# Patient Record
Sex: Male | Born: 1960 | Race: White | Hispanic: No | Smoking: Former smoker
Health system: Southern US, Community
[De-identification: ages and names within clinical notes are randomized; demographics above are authoritative.]

## PROBLEM LIST (undated history)

## (undated) DIAGNOSIS — I739 Peripheral vascular disease, unspecified: Principal | ICD-10-CM

## (undated) DIAGNOSIS — F32A Depression, unspecified: Secondary | ICD-10-CM

## (undated) DIAGNOSIS — I509 Heart failure, unspecified: Secondary | ICD-10-CM

## (undated) DIAGNOSIS — R0602 Shortness of breath: Secondary | ICD-10-CM

## (undated) DIAGNOSIS — F319 Bipolar disorder, unspecified: Secondary | ICD-10-CM

## (undated) DIAGNOSIS — J189 Pneumonia, unspecified organism: Secondary | ICD-10-CM

## (undated) DIAGNOSIS — F329 Major depressive disorder, single episode, unspecified: Secondary | ICD-10-CM

## (undated) DIAGNOSIS — K279 Peptic ulcer, site unspecified, unspecified as acute or chronic, without hemorrhage or perforation: Secondary | ICD-10-CM

## (undated) DIAGNOSIS — D62 Acute posthemorrhagic anemia: Secondary | ICD-10-CM

## (undated) DIAGNOSIS — J449 Chronic obstructive pulmonary disease, unspecified: Principal | ICD-10-CM

## (undated) DIAGNOSIS — I10 Essential (primary) hypertension: Secondary | ICD-10-CM

## (undated) DIAGNOSIS — E785 Hyperlipidemia, unspecified: Secondary | ICD-10-CM

## (undated) DIAGNOSIS — M1990 Unspecified osteoarthritis, unspecified site: Secondary | ICD-10-CM

## (undated) DIAGNOSIS — F419 Anxiety disorder, unspecified: Secondary | ICD-10-CM

## (undated) HISTORY — DX: Peptic ulcer, site unspecified, unspecified as acute or chronic, without hemorrhage or perforation: K27.9

## (undated) HISTORY — PX: SHOULDER SURGERY: SHX246

## (undated) HISTORY — PX: TOE SURGERY: SHX1073

## (undated) HISTORY — DX: Peripheral vascular disease, unspecified: I73.9

## (undated) HISTORY — DX: Chronic obstructive pulmonary disease, unspecified: J44.9

## (undated) HISTORY — DX: Bipolar disorder, unspecified: F31.9

## (undated) HISTORY — PX: HAND SURGERY: SHX662

## (undated) HISTORY — DX: Hyperlipidemia, unspecified: E78.5

---

## 2001-02-19 ENCOUNTER — Emergency Department (HOSPITAL_COMMUNITY): Admission: EM | Admit: 2001-02-19 | Discharge: 2001-02-19 | Payer: Self-pay | Admitting: Emergency Medicine

## 2001-04-08 ENCOUNTER — Ambulatory Visit (HOSPITAL_COMMUNITY): Admission: RE | Admit: 2001-04-08 | Discharge: 2001-04-08 | Payer: Self-pay | Admitting: Family Medicine

## 2001-04-08 ENCOUNTER — Encounter: Payer: Self-pay | Admitting: Family Medicine

## 2001-11-27 ENCOUNTER — Encounter: Payer: Self-pay | Admitting: Orthopedic Surgery

## 2001-11-27 ENCOUNTER — Inpatient Hospital Stay (HOSPITAL_COMMUNITY): Admission: EM | Admit: 2001-11-27 | Discharge: 2001-11-29 | Payer: Self-pay | Admitting: Emergency Medicine

## 2002-04-18 ENCOUNTER — Ambulatory Visit (HOSPITAL_COMMUNITY): Admission: RE | Admit: 2002-04-18 | Discharge: 2002-04-18 | Payer: Self-pay | Admitting: Family Medicine

## 2002-04-18 ENCOUNTER — Encounter: Payer: Self-pay | Admitting: Family Medicine

## 2003-01-09 ENCOUNTER — Emergency Department (HOSPITAL_COMMUNITY): Admission: EM | Admit: 2003-01-09 | Discharge: 2003-01-09 | Payer: Self-pay | Admitting: *Deleted

## 2003-02-19 ENCOUNTER — Ambulatory Visit (HOSPITAL_COMMUNITY): Admission: RE | Admit: 2003-02-19 | Discharge: 2003-02-19 | Payer: Self-pay | Admitting: Family Medicine

## 2003-02-19 ENCOUNTER — Encounter: Payer: Self-pay | Admitting: Family Medicine

## 2003-10-01 ENCOUNTER — Emergency Department (HOSPITAL_COMMUNITY): Admission: EM | Admit: 2003-10-01 | Discharge: 2003-10-01 | Payer: Self-pay | Admitting: Emergency Medicine

## 2004-02-13 ENCOUNTER — Emergency Department (HOSPITAL_COMMUNITY): Admission: EM | Admit: 2004-02-13 | Discharge: 2004-02-13 | Payer: Self-pay | Admitting: Emergency Medicine

## 2004-02-14 ENCOUNTER — Inpatient Hospital Stay (HOSPITAL_COMMUNITY): Admission: RE | Admit: 2004-02-14 | Discharge: 2004-02-26 | Payer: Self-pay | Admitting: Psychiatry

## 2004-02-22 ENCOUNTER — Ambulatory Visit (HOSPITAL_COMMUNITY): Admission: RE | Admit: 2004-02-22 | Discharge: 2004-02-22 | Payer: Self-pay | Admitting: Psychiatry

## 2004-02-23 ENCOUNTER — Emergency Department (HOSPITAL_COMMUNITY): Admission: EM | Admit: 2004-02-23 | Discharge: 2004-02-23 | Payer: Self-pay | Admitting: Emergency Medicine

## 2004-05-05 ENCOUNTER — Ambulatory Visit (HOSPITAL_COMMUNITY): Admission: RE | Admit: 2004-05-05 | Discharge: 2004-05-05 | Payer: Self-pay | Admitting: Family Medicine

## 2004-05-12 ENCOUNTER — Encounter
Admission: RE | Admit: 2004-05-12 | Discharge: 2004-07-30 | Payer: Self-pay | Admitting: Physical Medicine & Rehabilitation

## 2004-06-03 ENCOUNTER — Ambulatory Visit (HOSPITAL_COMMUNITY): Admission: RE | Admit: 2004-06-03 | Discharge: 2004-06-03 | Payer: Self-pay | Admitting: Orthopedic Surgery

## 2004-06-09 ENCOUNTER — Encounter (HOSPITAL_COMMUNITY): Admission: RE | Admit: 2004-06-09 | Discharge: 2004-07-09 | Payer: Self-pay | Admitting: Orthopedic Surgery

## 2004-07-15 ENCOUNTER — Encounter (HOSPITAL_COMMUNITY): Admission: RE | Admit: 2004-07-15 | Discharge: 2004-08-14 | Payer: Self-pay | Admitting: Orthopedic Surgery

## 2004-10-23 ENCOUNTER — Inpatient Hospital Stay (HOSPITAL_COMMUNITY): Admission: EM | Admit: 2004-10-23 | Discharge: 2004-10-31 | Payer: Self-pay | Admitting: Emergency Medicine

## 2004-11-01 ENCOUNTER — Encounter (HOSPITAL_COMMUNITY): Admission: RE | Admit: 2004-11-01 | Discharge: 2004-11-21 | Payer: Self-pay | Admitting: General Surgery

## 2005-01-28 ENCOUNTER — Emergency Department (HOSPITAL_COMMUNITY): Admission: EM | Admit: 2005-01-28 | Discharge: 2005-01-29 | Payer: Self-pay | Admitting: *Deleted

## 2005-01-29 ENCOUNTER — Ambulatory Visit: Payer: Self-pay | Admitting: Psychiatry

## 2005-01-29 ENCOUNTER — Inpatient Hospital Stay (HOSPITAL_COMMUNITY): Admission: EM | Admit: 2005-01-29 | Discharge: 2005-02-05 | Payer: Self-pay | Admitting: Psychiatry

## 2005-02-24 ENCOUNTER — Ambulatory Visit: Payer: Self-pay | Admitting: *Deleted

## 2005-03-03 ENCOUNTER — Encounter (HOSPITAL_COMMUNITY): Admission: RE | Admit: 2005-03-03 | Discharge: 2005-03-03 | Payer: Self-pay | Admitting: *Deleted

## 2005-03-03 ENCOUNTER — Ambulatory Visit: Payer: Self-pay | Admitting: *Deleted

## 2005-03-10 ENCOUNTER — Ambulatory Visit: Payer: Self-pay | Admitting: *Deleted

## 2007-02-25 ENCOUNTER — Ambulatory Visit (HOSPITAL_COMMUNITY): Admission: RE | Admit: 2007-02-25 | Discharge: 2007-02-25 | Payer: Self-pay | Admitting: Family Medicine

## 2007-03-07 ENCOUNTER — Ambulatory Visit: Payer: Self-pay | Admitting: Internal Medicine

## 2007-03-14 ENCOUNTER — Encounter (INDEPENDENT_AMBULATORY_CARE_PROVIDER_SITE_OTHER): Payer: Self-pay | Admitting: Specialist

## 2007-03-14 ENCOUNTER — Ambulatory Visit: Payer: Self-pay | Admitting: Internal Medicine

## 2007-03-14 ENCOUNTER — Ambulatory Visit (HOSPITAL_COMMUNITY): Admission: RE | Admit: 2007-03-14 | Discharge: 2007-03-14 | Payer: Self-pay | Admitting: Internal Medicine

## 2007-03-14 HISTORY — PX: ESOPHAGOGASTRODUODENOSCOPY: SHX1529

## 2007-03-14 HISTORY — PX: COLONOSCOPY: SHX174

## 2007-05-26 ENCOUNTER — Ambulatory Visit: Payer: Self-pay | Admitting: Cardiology

## 2007-07-20 ENCOUNTER — Other Ambulatory Visit: Payer: Self-pay

## 2007-07-20 ENCOUNTER — Ambulatory Visit: Payer: Self-pay | Admitting: Psychiatry

## 2007-07-20 ENCOUNTER — Inpatient Hospital Stay (HOSPITAL_COMMUNITY): Admission: AD | Admit: 2007-07-20 | Discharge: 2007-07-26 | Payer: Self-pay | Admitting: Psychiatry

## 2008-11-05 ENCOUNTER — Emergency Department (HOSPITAL_COMMUNITY): Admission: EM | Admit: 2008-11-05 | Discharge: 2008-11-05 | Payer: Self-pay | Admitting: Emergency Medicine

## 2009-01-21 ENCOUNTER — Emergency Department (HOSPITAL_COMMUNITY): Admission: EM | Admit: 2009-01-21 | Discharge: 2009-01-21 | Payer: Self-pay | Admitting: Emergency Medicine

## 2009-10-10 ENCOUNTER — Ambulatory Visit (HOSPITAL_COMMUNITY): Admission: RE | Admit: 2009-10-10 | Discharge: 2009-10-10 | Payer: Self-pay | Admitting: Family Medicine

## 2010-08-07 ENCOUNTER — Ambulatory Visit (HOSPITAL_COMMUNITY): Admission: RE | Admit: 2010-08-07 | Discharge: 2010-08-07 | Payer: Self-pay | Admitting: Family Medicine

## 2010-11-13 ENCOUNTER — Ambulatory Visit (HOSPITAL_COMMUNITY)
Admission: RE | Admit: 2010-11-13 | Discharge: 2010-11-13 | Payer: Self-pay | Source: Home / Self Care | Attending: Family Medicine | Admitting: Family Medicine

## 2011-03-01 ENCOUNTER — Emergency Department (HOSPITAL_COMMUNITY)
Admission: EM | Admit: 2011-03-01 | Discharge: 2011-03-01 | Disposition: A | Discharge status: Home / Self Care | Payer: Medicare PPO | Attending: Emergency Medicine | Admitting: Emergency Medicine

## 2011-03-01 DIAGNOSIS — R112 Nausea with vomiting, unspecified: Secondary | ICD-10-CM | POA: Insufficient documentation

## 2011-03-01 DIAGNOSIS — S98139A Complete traumatic amputation of one unspecified lesser toe, initial encounter: Secondary | ICD-10-CM | POA: Insufficient documentation

## 2011-03-01 DIAGNOSIS — Z79899 Other long term (current) drug therapy: Secondary | ICD-10-CM | POA: Insufficient documentation

## 2011-03-01 DIAGNOSIS — R109 Unspecified abdominal pain: Secondary | ICD-10-CM | POA: Insufficient documentation

## 2011-03-01 DIAGNOSIS — R197 Diarrhea, unspecified: Secondary | ICD-10-CM | POA: Insufficient documentation

## 2011-03-01 DIAGNOSIS — F17200 Nicotine dependence, unspecified, uncomplicated: Secondary | ICD-10-CM | POA: Insufficient documentation

## 2011-03-01 DIAGNOSIS — W57XXXA Bitten or stung by nonvenomous insect and other nonvenomous arthropods, initial encounter: Secondary | ICD-10-CM | POA: Insufficient documentation

## 2011-03-01 DIAGNOSIS — T148 Other injury of unspecified body region: Secondary | ICD-10-CM | POA: Insufficient documentation

## 2011-03-01 LAB — COMPREHENSIVE METABOLIC PANEL
ALT: 19 U/L (ref 0–53)
AST: 21 U/L (ref 0–37)
Albumin: 3.5 g/dL (ref 3.5–5.2)
Alkaline Phosphatase: 57 U/L (ref 39–117)
BUN: 2 mg/dL — ABNORMAL LOW (ref 6–23)
CO2: 28 mEq/L (ref 19–32)
Calcium: 9.1 mg/dL (ref 8.4–10.5)
Chloride: 103 mEq/L (ref 96–112)
Creatinine, Ser: 0.92 mg/dL (ref 0.4–1.5)
GFR calc Af Amer: >60 mL/min (ref >60)
GFR calc non Af Amer: >60 mL/min (ref >60)
Glucose, Bld: 90 mg/dL (ref 70–99)
Potassium: 3.7 mEq/L (ref 3.5–5.1)
Sodium: 136 mEq/L (ref 135–145)
Total Bilirubin: 0.7 mg/dL (ref 0.3–1.2)
Total Protein: 6 g/dL (ref 6.0–8.3)

## 2011-03-01 LAB — URINALYSIS, ROUTINE W REFLEX MICROSCOPIC
Bilirubin Urine: NEGATIVE
Glucose, UA: NEGATIVE mg/dL
Hgb urine dipstick: NEGATIVE
Ketones, ur: NEGATIVE mg/dL
Nitrite: NEGATIVE
Protein, ur: NEGATIVE mg/dL
Specific Gravity, Urine: 1.01 (ref 1.005–1.030)
Urobilinogen, UA: 0.2 mg/dL (ref 0.0–1.0)
pH: 7 (ref 5.0–8.0)

## 2011-03-01 LAB — DIFFERENTIAL
Basophils Absolute: 0 10*3/uL (ref 0.0–0.1)
Basophils Relative: 1 % (ref 0–1)
Eosinophils Absolute: 0.2 10*3/uL (ref 0.0–0.7)
Eosinophils Relative: 3 % (ref 0–5)
Lymphocytes Relative: 30 % (ref 12–46)
Lymphs Abs: 1.9 10*3/uL (ref 0.7–4.0)
Monocytes Absolute: 0.4 10*3/uL (ref 0.1–1.0)
Monocytes Relative: 7 % (ref 3–12)
Neutro Abs: 3.8 10*3/uL (ref 1.7–7.7)
Neutrophils Relative %: 60 % (ref 43–77)

## 2011-03-01 LAB — CBC
HCT: 38.6 % — ABNORMAL LOW (ref 39.0–52.0)
Hemoglobin: 13.2 g/dL (ref 13.0–17.0)
MCH: 30.3 pg (ref 26.0–34.0)
MCHC: 34.2 g/dL (ref 30.0–36.0)
MCV: 88.7 fL (ref 78.0–100.0)
Platelets: 253 10*3/uL (ref 150–400)
RBC: 4.35 MIL/uL (ref 4.22–5.81)
RDW: 12.4 % (ref 11.5–15.5)
WBC: 6.3 10*3/uL (ref 4.0–10.5)

## 2011-03-01 LAB — LIPASE, BLOOD: Lipase: 22 U/L (ref 11–59)

## 2011-03-05 LAB — BASIC METABOLIC PANEL
BUN: 4 mg/dL — ABNORMAL LOW (ref 6–23)
CO2: 33 mEq/L — ABNORMAL HIGH (ref 19–32)
Calcium: 8.8 mg/dL (ref 8.4–10.5)
Chloride: 101 mEq/L (ref 96–112)
Creatinine, Ser: 0.8 mg/dL (ref 0.4–1.5)
GFR calc Af Amer: >60 mL/min (ref >60)
GFR calc non Af Amer: >60 mL/min (ref >60)
Glucose, Bld: 91 mg/dL (ref 70–99)
Potassium: 4 mEq/L (ref 3.5–5.1)
Sodium: 138 mEq/L (ref 135–145)

## 2011-03-05 LAB — CBC
HCT: 45.2 % (ref 39.0–52.0)
Hemoglobin: 15.5 g/dL (ref 13.0–17.0)
MCHC: 34.2 g/dL (ref 30.0–36.0)
MCV: 94.1 fL (ref 78.0–100.0)
Platelets: 257 10*3/uL (ref 150–400)
RBC: 4.8 MIL/uL (ref 4.22–5.81)
RDW: 12.4 % (ref 11.5–15.5)
WBC: 6.6 10*3/uL (ref 4.0–10.5)

## 2011-03-05 LAB — URINALYSIS, ROUTINE W REFLEX MICROSCOPIC
Bilirubin Urine: NEGATIVE
Glucose, UA: NEGATIVE mg/dL
Hgb urine dipstick: NEGATIVE
Ketones, ur: NEGATIVE mg/dL
Nitrite: NEGATIVE
Protein, ur: NEGATIVE mg/dL
Specific Gravity, Urine: <1.005 — ABNORMAL LOW (ref 1.005–1.030)
Urobilinogen, UA: 0.2 mg/dL (ref 0.0–1.0)
pH: 6.5 (ref 5.0–8.0)

## 2011-03-05 LAB — DIFFERENTIAL
Basophils Absolute: 0.1 10*3/uL (ref 0.0–0.1)
Basophils Relative: 2 % — ABNORMAL HIGH (ref 0–1)
Eosinophils Absolute: 0.1 10*3/uL (ref 0.0–0.7)
Eosinophils Relative: 1 % (ref 0–5)
Lymphocytes Relative: 41 % (ref 12–46)
Lymphs Abs: 2.7 10*3/uL (ref 0.7–4.0)
Monocytes Absolute: 0.5 10*3/uL (ref 0.1–1.0)
Monocytes Relative: 7 % (ref 3–12)
Neutro Abs: 3.3 10*3/uL (ref 1.7–7.7)
Neutrophils Relative %: 50 % (ref 43–77)

## 2011-03-05 LAB — SALICYLATE LEVEL: Salicylate Lvl: <4 mg/dL (ref 2.8–20.0)

## 2011-03-05 LAB — RAPID URINE DRUG SCREEN, HOSP PERFORMED
Amphetamines: NOT DETECTED
Barbiturates: NOT DETECTED
Benzodiazepines: POSITIVE — AB
Cocaine: NOT DETECTED
Opiates: POSITIVE — AB
Tetrahydrocannabinol: NOT DETECTED

## 2011-03-05 LAB — ACETAMINOPHEN LEVEL: Acetaminophen (Tylenol), Serum: <10 ug/mL — ABNORMAL LOW (ref 10–30)
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
## 2011-04-10 NOTE — Op Note (Signed)
[name], [name]               ACCOUNT NO.:  [id number]   MEDICAL RECORD NO.:  [id number]          PATIENT TYPE:  AMB   LOCATION:  DAY                           [facility]  [facility]   PHYSICIAN:  Jeffrey [name], M.D.    DATE OF BIRTH:  Nov 06, 1961   DATE OF PROCEDURE:  03/14/2007  DATE OF DISCHARGE:                               OPERATIVE REPORT   PROCEDURE:  Esophagogastroduodenoscopy followed by colonoscopy.   INDICATIONS:  Jeffrey [name] is a 50 year old Caucasian male who has a 30-pound  weight loss over 3 months, associated with nausea, vomiting and  postprandial diarrhea.  He had a negative upper abdominal ultrasound and  workup has included normal CBC, chemistry panel except low glucose  level.  He also had upper abdominal ultrasound two weeks ago which was  negative other than single lesions suspicious for hemangioma measuring  10 x 12 mm.  He is undergoing diagnostic evaluation.  Procedure risks  were reviewed with the patient and informed consent was obtained.   ANESTHESIA:  Meds for conscious sedation included benzocaine spray for  pharyngeal topical anesthesia, Demerol 50 mg IV, Versed 15 mg IV.   FINDINGS:  Procedure performed in endoscopy suite.  The patient's vital  signs and O2 sat were monitored during procedure and remained stable.   Procedure #1.  Esophagogastroduodenoscopy:  The patient was placed in  the left lateral recumbent position.  Pentax videoscope was passed via  oropharynx without any difficulty into esophagus.   Esophagus:  Mucosa of the esophagus was normal.  GE junction was at 40  cm from the incisors and was unremarkable.   Stomach:  It was empty and distended very well with insufflation.  Folds  proximal stomach were normal.  Examination of the mucosa at the body was  normal.  There was patchy erythema at antrum without erosions or  ulceration.  Pyloric channel was patent.  Angularis, fundus and cardia  were examined by retroflexing the scope and were  normal.   Duodenum: Bulbar mucosa revealed patchy erythema and edema.  Scope was  passed in the second part of duodenum where there was some paucity of  folds.  There was no ulceration or scalloping noted.  Biopsy was taken  from the second and third part of the duodenum for routine histology and  endoscope was withdrawn.  The patient prepared for procedure #2.   Procedure #2.  Colonoscopy:.  Rectal examination performed.  No  abnormality noted on external or digital exam.  Pentax videoscope was  placed in the rectum and advanced under vision into sigmoid colon and  beyond.  Preparation was satisfactory.  Scope was passed into cecum  which was identified by appendiceal orifice and ileocecal valve.  Terminal ileum was examined for 10-15 cm and revealed normal mucosa.  Pictures taken for the record.  As the scope was withdrawn, colonic  mucosa was once again carefully examined and reveals normal mucosal  pattern throughout.  No diverticular changes were noted.  Rectal mucosa  similarly was normal.  Scope was retroflexed to examine anorectal  junction and single hemorrhoid was noted below the  dentate line.  Endoscope was then withdrawn.  The patient tolerated the procedure well.   FINAL DIAGNOSIS:  1. Nonerosive antral gastritis with bulbar duodenitis.  2. Some paucity to postbulbar duodenal folds and biopsy taken for      routine histology.  3. Normal colonoscopy and terminal ileoscopy except external      hemorrhoids.   These findings would not explain the patient's symptomatology.   RECOMMENDATIONS:  1. Will check a sed rate, H pylori serology, and random cortisol      level.  2. Bentyl 10 mg before each meal.  Prescription given for 60 doses      with one refill.  3. I will be contacting the patient with results of biopsy and blood      test results.  If all of these are negative, we will bring him back      for abdominopelvic CT.  4. Will also confer with his psychiatrist to  make sure his bipolar      disorder is in remission.      Jeffrey [name], M.D.  Electronically Signed     NR/MEDQ  D:  03/14/2007  T:  03/15/2007  Job:  [id number]   cc:   Angus G. Renard Matter, MD  Fax: [phone]
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
## 2011-04-10 NOTE — Op Note (Signed)
NAME:  PENNY, FRISBIE                         ACCOUNT NO.:  [id number]   MEDICAL RECORD NO.:  [id number]                   PATIENT TYPE:  AMB   LOCATION:  DAY                                  FACILITY:  [facility]   PHYSICIAN:  Vickki Hearing, M.D.           DATE OF BIRTH:  1961/08/22   DATE OF PROCEDURE:  06/03/2004  DATE OF DISCHARGE:                                 OPERATIVE REPORT   HISTORY:  50 year-old male with longstanding right shoulder pain,  MRI documented right rotator cuff tear and AC joint arthritis presented with  indication for surgery of pain and inability to raise his arm above 90  degrees.   SURGEON:  Fuller Canada, M.D.   ANESTHETIC:  General.   FINDINGS:  A 1 cm rotator cuff tear, AC joint arthrosis mainly involving  supraspinatus tendon.   DETAILS OF PROCEDURE:  Ivy Meriwether was identified in the preoperative  holding area, his chart and consent form were reviewed.  My initials were  placed over his marked operative site, which was the right shoulder.  He was  given 1 gm of Ancef, taken to the operating room for general anesthetic.  Placed in the 30 degree elevated position.  After general anesthesia, he had  sterile prep and drape.  We took a time-out as required, everyone concurred  the procedure was a right rotator cuff repair on Sebasthian [place]und[place].   After sterile prep and drape was completed, an incision was made over the [facility]  joint and carried down over the anterior and middle third of the deltoid.  This was carried down to the deltoid fascia, which was opened, and then this  incision was carried over the distal clavicle and AC joint in subperiosteal  fashion, creating two flaps.  The distal clavicle was removed approximately  8 cm including the beveling of the posterior remaining portion.  An  acromioplasty was performed, a bursectomy was done and the tear was  identified.  There was no retraction really of the tear, it was a 1 cm tear  with  exposure of the humeral head.   The tear edges were freshened up.  A bur was used to create a trough.  Two  #2 Ethibond nonabsorbable sutures were passed through the greater tuberosity  and tied over bone bridges.   This secured the repair, made a watertight closure.  We irrigated, closed  with #1 Vicryl and 0 Vicryl.  We inserted a pain pump.  Staples were used to  close the skin.  A sterile dressing, Cryo/Cuff and sling and swathe were  applied.  The patient was extubated, taken to the recovery room in stable  condition.   He will be discharged when pain level is less than or equal to 4/10 after  one hour.  If not, he will be observed.     ___________________________________________  Vickki Hearing, M.D.   SEH/MEDQ  D:  06/03/2004  T:  06/03/2004  Job:  [id number]
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
## 2011-04-10 NOTE — Discharge Summary (Signed)
[name], Jeffrey [name]               ACCOUNT NO.:  [id number]   MEDICAL RECORD NO.:  [id number]          PATIENT TYPE:  INP   LOCATION:  A321                          FACILITY:  [facility]   PHYSICIAN:  Barbaraann Barthel, M.D. DATE OF BIRTH:  27-Feb-1961   DATE OF ADMISSION:  10/23/2004  DATE OF DISCHARGE:  12/09/2005LH                                 DISCHARGE SUMMARY   ADDENDUM:   DISCHARGE INSTRUCTIONS:  1.  He is excused from work.  2.  Diet:  Regular diet.  3.  He is told to take a multivitamin daily.  4.  He is told to increase his activities as tolerated.  5.  He is permitted to shower and clean off the [place] and redress as      instructed.  6.  He is told to do no heavy lifting or driving or any sexual activity at      the present.  7.  He is also obviously told to stay away from inflammable things.   DISCHARGE MEDICATIONS:  1.  He is to resume all of his psychotropic drugs as per Dr. Renard Matter.  2.  I placed him as well on Colace 100 mg b.i.d., Darvocet N 100, one tablet      p.o. q.4h. p.r.n., and we have made followup arrangements with the      physical therapy department.  3.  He has also made followup arrangements to see Dr. Lita Mains on January 6.     Will   WB/MEDQ  D:  10/31/2004  T:  11/01/2004  Job:  [id number]   cc:   Susa Simmonds, M.D.  [address]  [place]  [place] [address]  Fax: [phone]   Angus G. Renard Matter, MD  [address]  [place]  [place] [address]  Fax: [phone]
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
## 2011-04-10 NOTE — Procedures (Signed)
NAMEKALIJAH, ZEISS               ACCOUNT NO.:  [id number]   MEDICAL RECORD NO.:  [id number]           PATIENT TYPE:   LOCATION:                                 FACILITY:   PHYSICIAN:  Vida Roller, M.D.   DATE OF BIRTH:  02-18-1961   DATE OF PROCEDURE:  DATE OF DISCHARGE:                                    STRESS TEST   HISTORY OF PRESENT ILLNESS:  Mr. Skellenger is a 50 year old gentleman with no  prior cardiac history who complains of anterior chest discomfort while  exercising. Cardiac risk factors include tobacco abuse, dyslipidemia and  family history.   BASELINE DATA:  EKG reveals a sinus rhythm at 77 beats per minute with  nonspecific ST abnormalities. Blood pressure is 120/72.   Patient exercised for total of 8 minutes 9 seconds, Bruce protocol stage III  at 10.1 minutes. Maximal heart rate of 165 beats per minute which is 94%  predicted maximum. Maximum blood pressure was 198/78 and resolved down to  148/70 in recovery. The patient describes shortness of breath at the end of  exercise that resolved in recovery. EKG revealed no arrhythmias. No ischemic  changes were noted. Exercise was stopped secondary to fatigue and shortness  of breath. Myoview was injected 1 minute prior to cessation of exercise.   Final images and results are pending M.D. review.      AB/MEDQ  D:  03/03/2005  T:  03/03/2005  Job:  [id number]

## 2011-04-10 NOTE — Op Note (Signed)
[place]. [facility]  Patient:    Jeffrey [name], Jeffrey [name] Visit Number: [id number] MRN: [id number]          Service Type: SUR Location: [id number] Attending Physician:  Dominica Severin Dictated by:   Elisha Ponder, M.D. Proc. Date: 11/27/01 Admit Date:  11/27/2001   CC:         Nicoletta Dress. Colon Branch, M.D.   Operative Report  DATE OF BIRTH:  09-21-1961  PREOPERATIVE DIAGNOSIS:  Laceration to the left forearm distally with neurovascular injury as well as tendinous injury.  Specifically, the patient lacerated the volar aspect of his forearm with obvious flexor digitorum superficialis injury as well as median nerve laceration, ulnar artery laceration, and wrist flexor laceration.  POSTOPERATIVE DIAGNOSIS:  Laceration to the left forearm distally with neurovascular injury as well as tendinous injury.  Specifically, the patient lacerated the volar aspect of his forearm with obvious flexor digitorum superficialis injury as well as median nerve laceration, ulnar artery laceration, and wrist flexor laceration, (flexor carpi radialis, flexor carpi radialis, palmaris longus, flexor digitorum superficialis to the index through small finger, median nerve, ulnar artery, and muscle lacerations complete). 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  OPERATIVE PROCEDURE:  1. I&D of skin, subcutaneous tissue, and muscle, as well as tendon of left     distal forearm.  2. Repair of flexor carpi radialis tendon at the distal forearm wrist level.  3. Repair of flexor carpi ulnaris tendon.  4. Repair of palmaris longus tendon.  5. Repair of flexor digitorum superficialis to the index finger tendon.  6. Repair of flexor digitorum superficialis to the middle finger tendon.  7. Repair of flexor digitorum superficialis to the ring finger flexor tendon.  8. Repair of flexor digitorum superficialis to the small finger flexor     tendon.  9. Repair of median nerve at the distal  forearm level. 10. Repair of ulnar artery at the same level. 11. Exploration of the ulnar nerve with noted neuropraxia. 12. Microscope used for median nerve and ulnar artery repair and ulnar nerve     exploration. 13. Left carpal tunnel release.  SURGEON:  Elisha Ponder, M.D.  ASSISTANT:  Alexzandrew L. Perkins, P.A.-C.  COMPLICATIONS:  None.  ANESTHESIA:  General.  TOURNIQUET TIME:  Less than 90 minutes.  DRAINS:  One.  INDICATIONS FOR PROCEDURE:  The patient is a 50 year old white male who presents with the above mentioned diagnosis.  I have counseled him in regards to risks and benefits of surgery including the risks of infection, bleeding, anesthesia, damage to normal structures, and failure of the surgery to accomplish its intended goals of relieving symptoms and restoring function. With this in mind, he desires to proceed.  All questions have been encouraged and answered preoperatively.  I have discussed with the patient that given his smoking habitus and mature age, that full nerve recovery is not a guarantee. I have discussed with him the need for supervised therapy as well as the above mentioned repairs and exploration.  We have discussed all issues at great length including the risks of bleeding, infection, anesthesia, damage to normal structure, and failure of the surgery to accomplish its intended goals of relieving symptoms and restoring function.  All questions have been encouraged and answered.  DESCRIPTION OF PROCEDURE:  The patient was seen by myself and anesthesia.  He was taken to the operative suite.  After being given preoperative Ancef, he was laid supine, appropriately padded, and underwent  a smooth induction of general anesthesia under the direction of Dr. Adonis Huguenin.  Following this, the left upper extremity was carefully unwrapped, placed on a hand table securely, and then prepped and draped in the usual sterile fashion after application of a  tourniquet.  Once the sterile field was secured, the patient had prior loose closure sutures removed.  Next, the arm was elevated, and the tourniquet was insufflated to 250 mmHg pressure, and the operation ensued by making proximal and distal extension.  The patients laceration occurred approximately 2 inches to 2-1/2 inches above the distal wrist crease. Dissection was carried down along the proximal and distal limbs to the antebrachial fascia and a layer of tissue was removed.  Bipolar electrocautery was used for hemostasis.  Following this, an additional incision into the carpal canal was accomplished. Given his level, I felt that the patient would have undue pressure without carpal tunnel release, and thus, we did extend the incision for carpal tunnel release.  Following this, skin flaps were created, and the zone of injury was outlined. Once this was done, the antecubital fascia was released proximally and distally as was the carpal tunnel ligament.  The carpal tunnel ligament was released under direct 4.0 loupe magnification, fat pad egressed, and it separated nicely.  The canal was somewhat tight and separated nicely without problems.  Once this was done, the patient then underwent copious I&D of skin, subcutaneous tissue, and muscle tissue, as well as tendinous tissue.  A large bloody hematoma was removed without difficulty.  The ulnar artery was identified.  It was noted to be thrombosed at the severed ends, creating a hemostatic plug.  The ulnar nerve and median nerve were identified.  The median nerve was lacerated.  The ulnar nerve had a contusive-type injury, but was intact.  All FDS tendons were lacerated, FCR, FCU, and palmaris longus were lacerated.  Following copious I&D and inspection to the structures, the patient then underwent repair of the flexor carpi radialis tendon with modified Kessler-Tajima suture and four strand repair.  Next, the patient underwent  flexor carpi ulnaris tendon repair utilizing  modified Kessler-Tajima suture and four strand repair.  Following this, the patient underwent repair of the flexor digitorum superficialis to the index finger followed by flexor digitorum superficialis repair to the middle finger, ring finger, and small finger.  This was done with a modified Kessler-Tajima suture.  These were two strand repairs to about the flexor digitorum superficialis.  All areas approximated nicely.  I should note that the flexor pollicis longus and the flexor digitorum profundus were intact, and were identified and explored during the course of the operation.  Following this, the patient had the microscope brought in.  The median nerve was identified and prepared.  It was washed copiously.  Following this, the patient had a circumferential epineural repair accomplished.  This was a grouped fascicular and epineural-type repair, aligned the fascicular groups nicely based on the median artery and the oblique nature of the cut.  The nerve approximated excellently and was tension-free with the wrist in neutral to 30 degrees extension.  I was very pleased with this repair.  Following this, the ulnar nerve was explored under the microscope.  It was noted to be intact, but did have neuropraxic contusive injury.  Following this, the ulnar artery was identified.  It was prepared at both ends.  Lacrimal duct dilator, lidocaine, and _______ solution were used to prepare the two ends of the artery, both retrograde and antegrade  flow was accomplished.  This was an excellent flow and following this, a vessel clamp was placed to approximate the vessels, and following this, a circumferential interrupted repair under the microscope was accomplished with a combination of 9-0 and 10-0 nylon.  The patient tolerated this well and she had excellent flow afterwards.  I should note that microscopic technique under the microscope was utilized  during this.  Excellent flow was established.  The patient had no back wall sutures placed abberantly or other complications.  I was very pleased with the flow.  Following this, the patient had the area inspected.  I had deflated the tourniquet after the tendon repairs were accomplished.  The tourniquet time was less than 90 minutes I should note.  The patient had copious irrigation applied to the wound.  Excellent refill was noted in the fingers and all compartments were soft.  Following this, the carpal tunnel was inspected once again, and noted to be completely released.  Hemostasis was secured nicely with bipolar electrocautery.  Next, the wound was closed with 0 Vicryl in the subcutaneous tissue followed by 4-0 Prolene in the skin edge.  A #7 TLS drain was placed to allow for egress of fluid postoperatively.  The patient tolerated this well without difficulty and there were no immediate postoperative complications.  All sponge, needle, and instrument counts were reported as correct.  Once the wound was closed, it was sterilely dressed, and the patient had a splint placed with the wrist in flexion and the MCP joint was in flexion with full extension at the PIP joint.  He tolerated the procedure nicely.  He was stable, awake, and alert in the recovery room.  We will monitor his care closely.  We will plan to continue IV antibiotics x 4 doses, elevation with _________ sling.  He will have appropriate pain management and will be on an aspirin twice a day due to the vessel repair.  I am going to ask the patient to be in therapy in the ensuing days under my direction, etc.  All questions have been encouraged and answered. Dictated by:   Elisha Ponder, M.D. Attending Physician:  Dominica Severin DD:  11/27/01 TD:  11/28/01 Job: [id number]
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
## 2011-09-04 LAB — CBC
HCT: 46.9
HCT: 43.2
Hemoglobin: 16.2
Hemoglobin: 14.9
MCHC: 34.6
MCHC: 34.6
MCV: 88.6
MCV: 87.3
Platelets: 249
Platelets: 247
RBC: 5.29
RBC: 4.94
RDW: 12.7
RDW: 12.6
WBC: 9
WBC: 6.6

## 2011-09-04 LAB — BASIC METABOLIC PANEL
BUN: 4 — ABNORMAL LOW
CO2: 28
Calcium: 8.7
Chloride: 107
Creatinine, Ser: 0.8
GFR calc Af Amer: >60
GFR calc non Af Amer: >60
Glucose, Bld: 82
Potassium: 3.5
Sodium: 140

## 2011-09-04 LAB — COMPREHENSIVE METABOLIC PANEL
ALT: 42
AST: 40 — ABNORMAL HIGH
Albumin: 3.5
Alkaline Phosphatase: 86
BUN: 8
CO2: 32
Calcium: 9.5
Chloride: 104
Creatinine, Ser: 0.88
GFR calc Af Amer: >60
GFR calc non Af Amer: >60
Glucose, Bld: 93
Potassium: 4.3
Sodium: 143
Total Bilirubin: 0.7
Total Protein: 5.8 — ABNORMAL LOW

## 2011-09-04 LAB — DIFFERENTIAL
Basophils Absolute: 0.1
Basophils Relative: 1
Eosinophils Absolute: 0.1
Eosinophils Relative: 2
Lymphocytes Relative: 41
Lymphs Abs: 2.7
Monocytes Absolute: 0.4
Monocytes Relative: 5
Neutro Abs: 3.3
Neutrophils Relative %: 50

## 2011-09-04 LAB — RAPID URINE DRUG SCREEN, HOSP PERFORMED
Amphetamines: NOT DETECTED
Barbiturates: NOT DETECTED
Benzodiazepines: POSITIVE — AB
Cocaine: NOT DETECTED
Opiates: POSITIVE — AB
Tetrahydrocannabinol: POSITIVE — AB

## 2011-09-04 LAB — ETHANOL: Alcohol, Ethyl (B): <5

## 2011-09-04 LAB — TSH: TSH: 2.584

## 2011-09-04 LAB — VALPROIC ACID LEVEL: Valproic Acid Lvl: 84.9

## 2011-11-26 ENCOUNTER — Ambulatory Visit (HOSPITAL_COMMUNITY)
Admission: RE | Admit: 2011-11-26 | Discharge: 2011-11-26 | Disposition: A | Discharge status: Home / Self Care | Payer: Medicare HMO | Source: Ambulatory Visit | Attending: Family Medicine | Admitting: Family Medicine

## 2011-11-26 ENCOUNTER — Other Ambulatory Visit (HOSPITAL_COMMUNITY): Payer: Self-pay | Admitting: Family Medicine

## 2011-11-26 DIAGNOSIS — M542 Cervicalgia: Secondary | ICD-10-CM

## 2011-11-26 DIAGNOSIS — M25519 Pain in unspecified shoulder: Secondary | ICD-10-CM | POA: Insufficient documentation

## 2011-11-26 DIAGNOSIS — M5030 Other cervical disc degeneration, unspecified cervical region: Secondary | ICD-10-CM | POA: Insufficient documentation

## 2011-12-01 ENCOUNTER — Other Ambulatory Visit (HOSPITAL_COMMUNITY): Payer: Self-pay | Admitting: Family Medicine

## 2011-12-01 DIAGNOSIS — IMO0002 Reserved for concepts with insufficient information to code with codable children: Secondary | ICD-10-CM

## 2011-12-03 ENCOUNTER — Ambulatory Visit (HOSPITAL_COMMUNITY)
Admission: RE | Admit: 2011-12-03 | Discharge: 2011-12-03 | Disposition: A | Discharge status: Home / Self Care | Payer: Medicare HMO | Source: Ambulatory Visit | Attending: Family Medicine | Admitting: Family Medicine

## 2011-12-03 DIAGNOSIS — M542 Cervicalgia: Secondary | ICD-10-CM | POA: Insufficient documentation

## 2011-12-03 DIAGNOSIS — R209 Unspecified disturbances of skin sensation: Secondary | ICD-10-CM | POA: Insufficient documentation

## 2011-12-03 DIAGNOSIS — IMO0002 Reserved for concepts with insufficient information to code with codable children: Secondary | ICD-10-CM

## 2011-12-03 DIAGNOSIS — M5020 Other cervical disc displacement, unspecified cervical region: Secondary | ICD-10-CM | POA: Insufficient documentation

## 2011-12-09 ENCOUNTER — Other Ambulatory Visit: Payer: Self-pay | Admitting: Family Medicine

## 2011-12-09 DIAGNOSIS — M542 Cervicalgia: Secondary | ICD-10-CM

## 2011-12-09 DIAGNOSIS — M25511 Pain in right shoulder: Secondary | ICD-10-CM

## 2011-12-16 ENCOUNTER — Ambulatory Visit
Admission: RE | Admit: 2011-12-16 | Discharge: 2011-12-16 | Disposition: A | Discharge status: Home / Self Care | Payer: Medicare HMO | Source: Ambulatory Visit | Attending: Family Medicine | Admitting: Family Medicine

## 2011-12-16 ENCOUNTER — Other Ambulatory Visit: Payer: Medicare HMO

## 2011-12-16 DIAGNOSIS — M25511 Pain in right shoulder: Secondary | ICD-10-CM

## 2011-12-16 DIAGNOSIS — M542 Cervicalgia: Secondary | ICD-10-CM

## 2011-12-16 MED ORDER — TRIAMCINOLONE ACETONIDE 40 MG/ML IJ SUSP (RADIOLOGY)
60.0000 mg | Freq: Once | INTRAMUSCULAR | Status: AC
  Administered 2011-12-16: 60 mg via EPIDURAL

## 2011-12-16 MED ORDER — IOHEXOL 300 MG/ML  SOLN
1.0000 mL | Freq: Once | INTRAMUSCULAR | Status: AC | PRN
  Administered 2011-12-16: 1 mL via EPIDURAL

## 2013-05-01 ENCOUNTER — Encounter: Payer: Self-pay | Admitting: Internal Medicine

## 2013-05-02 ENCOUNTER — Encounter (HOSPITAL_COMMUNITY): Payer: Self-pay | Admitting: Pharmacy Technician

## 2013-05-02 ENCOUNTER — Encounter: Payer: Self-pay | Admitting: Gastroenterology

## 2013-05-02 ENCOUNTER — Ambulatory Visit (INDEPENDENT_AMBULATORY_CARE_PROVIDER_SITE_OTHER): Payer: Medicare HMO | Admitting: Gastroenterology

## 2013-05-02 VITALS — BP 113/70 | HR 73 | Temp 98.2°F | Ht 70.0 in | Wt 158.6 lb

## 2013-05-02 DIAGNOSIS — K921 Melena: Secondary | ICD-10-CM

## 2013-05-02 DIAGNOSIS — R1033 Periumbilical pain: Secondary | ICD-10-CM

## 2013-05-02 DIAGNOSIS — R634 Abnormal weight loss: Secondary | ICD-10-CM | POA: Insufficient documentation

## 2013-05-02 DIAGNOSIS — K529 Noninfective gastroenteritis and colitis, unspecified: Secondary | ICD-10-CM | POA: Insufficient documentation

## 2013-05-02 DIAGNOSIS — R197 Diarrhea, unspecified: Secondary | ICD-10-CM

## 2013-05-02 LAB — CBC WITH DIFFERENTIAL/PLATELET
Basophils Absolute: 0.1 10*3/uL (ref 0.0–0.1)
Basophils Relative: 1 % (ref 0–1)
Eosinophils Absolute: 0.1 10*3/uL (ref 0.0–0.7)
Eosinophils Relative: 2 % (ref 0–5)
HCT: 39.7 % (ref 39.0–52.0)
Hemoglobin: 13.4 g/dL (ref 13.0–17.0)
Lymphocytes Relative: 30 % (ref 12–46)
Lymphs Abs: 1.6 10*3/uL (ref 0.7–4.0)
MCH: 29.3 pg (ref 26.0–34.0)
MCHC: 33.8 g/dL (ref 30.0–36.0)
MCV: 86.7 fL (ref 78.0–100.0)
Monocytes Absolute: 0.5 10*3/uL (ref 0.1–1.0)
Monocytes Relative: 10 % (ref 3–12)
Neutro Abs: 3.1 10*3/uL (ref 1.7–7.7)
Neutrophils Relative %: 57 % (ref 43–77)
Platelets: 251 10*3/uL (ref 150–400)
RBC: 4.58 MIL/uL (ref 4.22–5.81)
RDW: 12.9 % (ref 11.5–15.5)
WBC: 5.4 10*3/uL (ref 4.0–10.5)

## 2013-05-02 LAB — COMPREHENSIVE METABOLIC PANEL
ALT: 32 U/L (ref 0–53)
AST: 34 U/L (ref 0–37)
Albumin: 3.8 g/dL (ref 3.5–5.2)
Alkaline Phosphatase: 52 U/L (ref 39–117)
BUN: 4 mg/dL — ABNORMAL LOW (ref 6–23)
CO2: 28 mEq/L (ref 19–32)
Calcium: 9.5 mg/dL (ref 8.4–10.5)
Chloride: 109 mEq/L (ref 96–112)
Creat: 1.12 mg/dL (ref 0.50–1.35)
Glucose, Bld: 82 mg/dL (ref 70–99)
Potassium: 4.3 mEq/L (ref 3.5–5.3)
Sodium: 142 mEq/L (ref 135–145)
Total Bilirubin: 0.4 mg/dL (ref 0.3–1.2)
Total Protein: 6.1 g/dL (ref 6.0–8.3)

## 2013-05-02 LAB — TSH: TSH: 2.326 u[IU]/mL (ref 0.350–4.500)

## 2013-05-02 MED ORDER — PEG 3350-KCL-NA BICARB-NACL 420 G PO SOLR: 4000.0000 mL | ORAL | Status: DC

## 2013-05-02 NOTE — Progress Notes (Signed)
Cc PCP 
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
## 2013-05-02 NOTE — Patient Instructions (Addendum)
1. Colonoscopy with [name] in the near future. Please see separate instructions. 2. Please have your blood work done as soon as possible. We will call you with results. 3. Please see her family doctor regarding cough and shortness of breath. 4. Please consider smoking cessation. 5. While you are having diarrhea, please consider low-fiber diet as below.  Low-Fiber Diet Fiber is found in fruits, vegetables, and grains. A low-fiber diet restricts fibrous foods that are not digested in the small intestine. A diet containing about 10 grams of fiber is considered low fiber.  PURPOSE  To prevent blockage of a partially obstructed or narrowed gastrointestinal tract.  To reduce fecal weight and volume.  To slow the movement of feces. WHEN IS THIS DIET USED?  It may be used during the acute phase of Crohn disease, ulcerative colitis, regional enteritis, or diverticulitis.  It may be used if your intestinal or esophageal tubes are narrowing (stenosis).  It may be used as a transitional diet following surgery, injury (trauma), or illness. CHOOSING FOODS Check labels, especially on foods from the starch list. Often times, dietary fiber content is listed on the nutrition facts panel. Please ask your Registered Dietitian if you have questions about specific foods that are related to your condition, especially if the food is not listed on this handout. Breads and Starches  Allowed: White, [name], and pita breads, plain rolls, buns, or sweet rolls, doughnuts, waffles, pancakes, bagels. Plain muffins, biscuits, matzoth. Soda, saltine, graham crackers. Pretzels, rusks, melba toast, zwieback. Cooked cereals: cornmeal, farina, or cream cereals. Dry cereals: refined corn, wheat, rice, and oat cereals (check label). Potatoes prepared any way without skins, refined macaroni, spaghetti, noodles, refined rice.  Avoid: Whole-wheat bread, rolls, and crackers. Multigrains, rye, bran seeds, nuts, or coconut.  Cereals containing whole grains, multigrains, bran, coconut, nuts, raisins. Cooked or dry oatmeal. Coarse wheat cereals, granola. Cereals advertised as "high fiber." Potato skins. Whole-grain pasta, wild or brown rice. Popcorn. Vegetables  Allowed: Strained tomato and vegetable juices. Fresh lettuce, cucumber, spinach. Well-cooked or canned: asparagus, bean sprouts, broccoli, cut green beans, cauliflower, pumpkin, beets, mushrooms, olives, yellow squash, tomato, tomato sauce, zucchini, turnips.Keep servings limited to  cup.  Avoid: Fresh, cooked, or canned: artichokes, baked beans, beet greens, Brussels sprouts, corn, kale, legumes, peas, sweet potatoes. Avoid large servings of any vegetables. Fruit  Allowed: All fruit juices except prune juice. Cooked or canned fruits without skin and seeds: apricots, applesauce, cantaloupe, cherries, grapefruit, grapes, kiwi, mandarin oranges, peaches, pears, fruit cocktail, pineapple, plums, watermelon. Fresh without skin: banana, grapes, cantaloupe, avocado, cherries, pineapple, kiwi, nectarines, peaches, blueberries. Keep servings limited to  cup or 1 piece.  Avoid: Fresh: apples with or without skin, apricots, mangoes, pears, raspberries, strawberries. Prune juice and juices with pulp, stewed or dried prunes. Dried fruits, raisins, dates. Avoid large servings of all fresh fruits. Meat and Protein Substitutes  Allowed: Ground or well-cooked tender beef, ham, veal, lamb, pork, poultry. Eggs, plain cheese. Fish, oysters, shrimp, lobster, other seafood. Liver, organ meats. Smooth nut butters.  Avoid: Tough, fibrous meats with gristle. Chunky nut butter.Cheese with seeds, nuts, or other foods not allowed. Nuts, seeds, legumes, dried peas, beans, lentils. Dairy  Allowed: All milk products except those not allowed.  Avoid: Yogurt or cheese that contains nuts, seeds, or added fruit. Soups and Combination Foods  Allowed: Bouillon, broth, or cream soups made  from allowed foods. Any strained soup. Casseroles or mixed dishes made with allowed foods.  Avoid: Soups made from vegetables  that are not allowed or that contain other foods not allowed. Desserts and Sweets  Allowed:Plain cakes and cookies, pie made with allowed fruit, pudding, custard, cream pie. Gelatin, fruit, ice, sherbet, frozen ice pops. Ice cream, ice milk without nuts. Plain hard candy, honey, jelly, molasses, syrup, sugar, chocolate syrup, gumdrops, marshmallows.  Avoid: Desserts, cookies, or candies that contain nuts, peanut butter, dried fruits. Jams, preserves with seeds, marmalade. Fats and Oils  Allowed:Margarine, butter, cream, mayonnaise, salad oils, plain salad dressings made from allowed foods.  Avoid: Seeds, nuts, olives. Beverages  Allowed: All, except those listed to avoid.  Avoid: Fruit juices with high pulp, prune juice. Condiments  Allowed:Ketchup, mustard, horseradish, vinegar, cream sauce, cheese sauce, cocoa powder. Spices in moderation: allspice, basil, bay leaves, celery powder or leaves, cinnamon, cumin powder, curry powder, ginger, mace, marjoram, onion or garlic powder, oregano, paprika, parsley flakes, ground pepper, rosemary, sage, savory, tarragon, thyme, turmeric.  Avoid: Coconut, pickles. SAMPLE MENU Breakfast   cup orange juice.  1 boiled egg.  1 slice white toast.  Margarine.   cup cornflakes.  1 cup milk.  Beverage. Lunch   cup chicken noodle soup.  2 to 3 oz sliced roast beef.  2 slices white bread.  Mayonnaise.   cup tomato juice.  1 small banana.  Beverage. Dinner  3 oz baked chicken.   cup scalloped potatoes.   cup cooked beets.  White dinner roll.  Margarine.   cup canned peaches.  Beverage. Document Released: 05/01/2002 Document Revised: 02/01/2012 Document Reviewed: 11/26/2011 [facility] Patient Information 2014 [place], [place].

## 2013-05-02 NOTE — Assessment & Plan Note (Addendum)
52 year old gentleman with acute on chronic diarrhea, moderate volume hematochezia, weakness, weight loss. Ileocolonoscopy unremarkable 6 years ago. Patient states his symptoms continue to worsen, over last couple of months more progressive and associated with weight loss. Significant fecal urgency and incontinence at times. No recent antibiotic use. Recommend repeat ileocolonoscopy with [name]. Given polypharmacy, Phenergan 25 mg IV 30 minutes before procedure.  I have discussed the risks, alternatives, benefits with regards to but not limited to the risk of reaction to medication, bleeding, infection, perforation and the patient is agreeable to proceed. Written consent to be obtained. Check labs. After colonoscopy, can determine whether patient may be candidate for hemorrhoid banding procedure.  1. Colonoscopy with [name] in the near future. Please see separate instructions. 2. Please have your blood work done as soon as possible. We will call you with results. 3. Please see your family doctor regarding cough and shortness of breath. 4. Please consider smoking cessation. 5. While you are having diarrhea, please consider low-fiber diet as below.

## 2013-05-05 ENCOUNTER — Encounter (HOSPITAL_COMMUNITY)
Admission: RE | Disposition: A | Discharge status: Home / Self Care | Payer: Self-pay | Source: Ambulatory Visit | Attending: Internal Medicine

## 2013-05-05 ENCOUNTER — Ambulatory Visit (HOSPITAL_COMMUNITY)
Admission: RE | Admit: 2013-05-05 | Discharge: 2013-05-05 | Disposition: A | Discharge status: Home / Self Care | Payer: Medicare HMO | Source: Ambulatory Visit | Attending: Internal Medicine | Admitting: Internal Medicine

## 2013-05-05 ENCOUNTER — Encounter (HOSPITAL_COMMUNITY): Payer: Self-pay | Admitting: *Deleted

## 2013-05-05 DIAGNOSIS — R197 Diarrhea, unspecified: Secondary | ICD-10-CM | POA: Insufficient documentation

## 2013-05-05 DIAGNOSIS — K921 Melena: Secondary | ICD-10-CM

## 2013-05-05 DIAGNOSIS — K648 Other hemorrhoids: Secondary | ICD-10-CM | POA: Insufficient documentation

## 2013-05-05 DIAGNOSIS — K529 Noninfective gastroenteritis and colitis, unspecified: Secondary | ICD-10-CM

## 2013-05-05 DIAGNOSIS — R1033 Periumbilical pain: Secondary | ICD-10-CM

## 2013-05-05 DIAGNOSIS — E785 Hyperlipidemia, unspecified: Secondary | ICD-10-CM | POA: Insufficient documentation

## 2013-05-05 DIAGNOSIS — K644 Residual hemorrhoidal skin tags: Secondary | ICD-10-CM | POA: Insufficient documentation

## 2013-05-05 DIAGNOSIS — R634 Abnormal weight loss: Secondary | ICD-10-CM

## 2013-05-05 HISTORY — DX: Unspecified osteoarthritis, unspecified site: M19.90

## 2013-05-05 HISTORY — DX: Major depressive disorder, single episode, unspecified: F32.9

## 2013-05-05 HISTORY — DX: Anxiety disorder, unspecified: F41.9

## 2013-05-05 HISTORY — DX: Depression, unspecified: F32.A

## 2013-05-05 HISTORY — DX: Shortness of breath: R06.02

## 2013-05-05 HISTORY — PX: COLONOSCOPY: SHX5424

## 2013-05-05 HISTORY — DX: Pneumonia, unspecified organism: J18.9

## 2013-05-05 SURGERY — COLONOSCOPY
Anesthesia: Moderate Sedation

## 2013-05-05 MED ORDER — PROMETHAZINE HCL 25 MG/ML IJ SOLN
25.0000 mg | Freq: Once | INTRAMUSCULAR | Status: AC
  Administered 2013-05-05: 25 mg via INTRAVENOUS

## 2013-05-05 MED ORDER — SODIUM CHLORIDE 0.9 % IJ SOLN
INTRAMUSCULAR | Status: AC
  Filled 2013-05-05: qty 10

## 2013-05-05 MED ORDER — MEPERIDINE HCL 100 MG/ML IJ SOLN
INTRAMUSCULAR | Status: AC
  Filled 2013-05-05: qty 2

## 2013-05-05 MED ORDER — ONDANSETRON HCL 4 MG/2ML IJ SOLN
INTRAMUSCULAR | Status: AC
  Filled 2013-05-05: qty 2

## 2013-05-05 MED ORDER — STERILE WATER FOR IRRIGATION IR SOLN
Status: DC | PRN
  Administered 2013-05-05: 13:00:00

## 2013-05-05 MED ORDER — MIDAZOLAM HCL 5 MG/5ML IJ SOLN
INTRAMUSCULAR | Status: AC
  Filled 2013-05-05: qty 10

## 2013-05-05 MED ORDER — MEPERIDINE HCL 100 MG/ML IJ SOLN
INTRAMUSCULAR | Status: DC | PRN
  Administered 2013-05-05: 25 mg via INTRAVENOUS

## 2013-05-05 MED ORDER — PROMETHAZINE HCL 25 MG/ML IJ SOLN
INTRAMUSCULAR | Status: AC
  Filled 2013-05-05: qty 1

## 2013-05-05 MED ORDER — MIDAZOLAM HCL 5 MG/5ML IJ SOLN
INTRAMUSCULAR | Status: DC | PRN
  Administered 2013-05-05: 2 mg via INTRAVENOUS

## 2013-05-05 MED ORDER — SODIUM CHLORIDE 0.9 % IV SOLN
INTRAVENOUS | Status: DC
  Administered 2013-05-05 (×2): via INTRAVENOUS

## 2013-05-05 MED ORDER — ONDANSETRON HCL 4 MG/2ML IJ SOLN
INTRAMUSCULAR | Status: DC | PRN
  Administered 2013-05-05: 4 mg via INTRAVENOUS

## 2013-05-05 NOTE — Interval H&P Note (Signed)
History and Physical Interval Note:  05/05/2013 1:28 PM  [name]  has presented today for surgery, with the diagnosis of hEMATOCHEZIA, MID ABDOMINAL PAIN, CHRONIC DIARRHEA, WEIGHT LOSS  The various methods of treatment have been discussed with the patient and family. After consideration of risks, benefits and other options for treatment, the patient has consented to  Procedure(s) with comments: COLONOSCOPY (N/A) - 1:30 as a surgical intervention .  The patient's history has been reviewed, patient examined, no change in status, stable for surgery.  I have reviewed the patient's chart and labs.  Questions were answered to the patient's satisfaction.     Robert Rourk  Colonoscopy for hematochezia and chronic diarrhea.  The risks, benefits, limitations, alternatives and imponderables have been reviewed with the patient. Questions have been answered. All parties are agreeable.

## 2013-05-05 NOTE — Op Note (Signed)
[facility] [address] [place], [address]   COLONOSCOPY PROCEDURE REPORT  PATIENT: Jeffrey [name], Jeffrey [name]  MR#:         [id number] BIRTHDATE: Oct 10, 1961 , 51  yrs. old GENDER: Male ENDOSCOPIST: R.  Roetta Sessions, MD FACP FACG REFERRED BY:  Butch Penny, M.D. PROCEDURE DATE:  05/05/2013 PROCEDURE:     Colonoscopy was several biopsy  INDICATIONS: Chronic diarrhea; hematochezia  INFORMED CONSENT:  The risks, benefits, alternatives and imponderables including but not limited to bleeding, perforation as well as the possibility of a missed lesion have been reviewed.  The potential for biopsy, lesion removal, etc. have also been discussed.  Questions have been answered.  All parties agreeable. Please see the history and physical in the medical record for more information.  MEDICATIONS: Versed 2 mg IV Demeroll 25 mg IV in divided doses. Phenergan 25 mg IV. Zofran 4 mg IV.  DESCRIPTION OF PROCEDURE:  After a digital rectal exam was performed, the EC-[id number] ([id number]) and EC-[id number] ([id number]) colonoscope was advanced from the anus through the rectum and colon to the area of the cecum, ileocecal valve and appendiceal orifice. The cecum was deeply intubated.  These structures were well-seen and photographed for the record.  From the level of the cecum and ileocecal valve, the scope was slowly and cautiously withdrawn. The mucosal surfaces were carefully surveyed utilizing scope tip deflection to facilitate fold flattening as needed.  The scope was pulled down into the rectum where a thorough examination including retroflexion was performed.    FINDINGS:  Adequate preparation. External /  anal canal hemorrhoids; otherwise, normal appearing rectum. Normal-appearing colonic mucosa. I attempted to intubate the terminal ileum , however, was unable to do so.  THERAPEUTIC / DIAGNOSTIC MANEUVERS PERFORMED:  Segmental biopsies of the sigmoid and  ascendking segments were taken  for histologic study.  COMPLICATIONS: None  CECAL WITHDRAWAL TIME:  8 minutes  IMPRESSION:  Hemorrhoids-likely source of hematochezia. Normal appearing colon and rectum, otherwise. Status post segmental biopsy  RECOMMENDATIONS: Stop using illicit drugs and alcohol. Course of Anusol suppositories. Further recommendations to follow.   _______________________________ eSigned:  R. Roetta Sessions, MD FACP [facility] 05/05/2013 2:04 PM   CC:
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
## 2013-05-08 ENCOUNTER — Telehealth: Payer: Self-pay | Admitting: Internal Medicine

## 2013-05-08 NOTE — Telephone Encounter (Signed)
Wife LMOM that husband had tcs done Friday and asked for someone to call her back at [phone]

## 2013-05-09 ENCOUNTER — Encounter: Payer: Self-pay | Admitting: Internal Medicine

## 2013-05-09 ENCOUNTER — Encounter (HOSPITAL_COMMUNITY): Payer: Self-pay | Admitting: Internal Medicine

## 2013-05-09 NOTE — Telephone Encounter (Signed)
Spoke with pts wife, she had only wanted pts lab work sent to pts pcp. She stated it was done this morning.

## 2013-05-11 NOTE — Progress Notes (Signed)
Quick Note:  Please let pt know his labs look good. ______

## 2013-05-12 ENCOUNTER — Other Ambulatory Visit (HOSPITAL_COMMUNITY): Payer: Self-pay | Admitting: Family Medicine

## 2013-05-12 ENCOUNTER — Ambulatory Visit (HOSPITAL_COMMUNITY)
Admission: RE | Admit: 2013-05-12 | Discharge: 2013-05-12 | Disposition: A | Discharge status: Home / Self Care | Payer: Medicare HMO | Source: Ambulatory Visit | Attending: Family Medicine | Admitting: Family Medicine

## 2013-05-12 DIAGNOSIS — R059 Cough, unspecified: Secondary | ICD-10-CM | POA: Insufficient documentation

## 2013-05-12 DIAGNOSIS — R05 Cough: Secondary | ICD-10-CM

## 2013-05-12 DIAGNOSIS — J4489 Other specified chronic obstructive pulmonary disease: Secondary | ICD-10-CM | POA: Insufficient documentation

## 2013-05-12 DIAGNOSIS — J449 Chronic obstructive pulmonary disease, unspecified: Secondary | ICD-10-CM | POA: Insufficient documentation

## 2013-05-30 ENCOUNTER — Telehealth: Payer: Self-pay | Admitting: General Practice

## 2013-05-30 NOTE — Telephone Encounter (Signed)
I spoke with the patient and made her aware of this and gave her the reference # for the call

## 2013-05-30 NOTE — Telephone Encounter (Signed)
I spoke with [facility] and patient's claims were processed as OON and they will reprocessed with doctor listed in network.  [phone]

## 2013-05-30 NOTE — Telephone Encounter (Signed)
WRITTEN BY SUSAN SHERMAN IN A STAFF MESSAGE:  The wife of Jeffrey [name] 2061/02/29 called Thursday afternoon saying that patient had tcs on 6/13 and his insurance [facility]) is telling them that it was denied because it should have been preapproved first. She was asking if we could call or send the insurance company something stating that it was necessary that he had procedure. I told lady before she got too upset that I would take her message and get with my office manager, but it would probably be Monday before calling her back being that the office is closed this Friday for holiday. She agreed. [phone]

## 2013-05-30 NOTE — Telephone Encounter (Signed)
I spoke with Tammy in hospital billing and she is going to get the hospital bill reprocessed in network.

## 2013-07-31 ENCOUNTER — Other Ambulatory Visit (HOSPITAL_COMMUNITY): Payer: Self-pay | Admitting: Family Medicine

## 2013-07-31 ENCOUNTER — Ambulatory Visit (HOSPITAL_COMMUNITY)
Admission: RE | Admit: 2013-07-31 | Discharge: 2013-07-31 | Disposition: A | Discharge status: Home / Self Care | Payer: Medicare HMO | Source: Ambulatory Visit | Attending: Family Medicine | Admitting: Family Medicine

## 2013-07-31 DIAGNOSIS — S46909A Unspecified injury of unspecified muscle, fascia and tendon at shoulder and upper arm level, unspecified arm, initial encounter: Secondary | ICD-10-CM | POA: Insufficient documentation

## 2013-07-31 DIAGNOSIS — M25519 Pain in unspecified shoulder: Secondary | ICD-10-CM | POA: Insufficient documentation

## 2013-07-31 DIAGNOSIS — S4980XA Other specified injuries of shoulder and upper arm, unspecified arm, initial encounter: Secondary | ICD-10-CM | POA: Insufficient documentation

## 2013-07-31 DIAGNOSIS — X58XXXA Exposure to other specified factors, initial encounter: Secondary | ICD-10-CM | POA: Insufficient documentation

## 2013-07-31 DIAGNOSIS — W19XXXA Unspecified fall, initial encounter: Secondary | ICD-10-CM

## 2015-04-24 ENCOUNTER — Encounter (HOSPITAL_COMMUNITY): Payer: Self-pay | Admitting: Psychiatry

## 2015-04-24 ENCOUNTER — Ambulatory Visit (INDEPENDENT_AMBULATORY_CARE_PROVIDER_SITE_OTHER): Payer: Medicare HMO | Admitting: Psychiatry

## 2015-04-24 VITALS — BP 127/65 | HR 65 | Ht 70.0 in | Wt 135.0 lb

## 2015-04-24 DIAGNOSIS — F332 Major depressive disorder, recurrent severe without psychotic features: Secondary | ICD-10-CM | POA: Diagnosis not present

## 2015-04-24 MED ORDER — TRAZODONE HCL ER 150 MG PO TB24: ORAL_TABLET | ORAL | Status: DC

## 2015-04-24 MED ORDER — FLUOXETINE HCL 40 MG PO CAPS: 40.0000 mg | ORAL_CAPSULE | ORAL | Status: DC

## 2015-04-24 MED ORDER — DIVALPROEX SODIUM 250 MG PO DR TAB: DELAYED_RELEASE_TABLET | ORAL | Status: DC

## 2015-04-24 MED ORDER — OLANZAPINE 10 MG PO TABS: 10.0000 mg | ORAL_TABLET | Freq: Every day | ORAL | Status: DC

## 2015-04-24 MED ORDER — BUSPIRONE HCL 10 MG PO TABS: 10.0000 mg | ORAL_TABLET | Freq: Two times a day (BID) | ORAL | Status: DC
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
## 2015-04-24 NOTE — Patient Instructions (Signed)
Stop paxil and navane Start prozac and olanzapine

## 2015-05-15 ENCOUNTER — Ambulatory Visit (HOSPITAL_COMMUNITY): Payer: Self-pay | Admitting: Psychiatry

## 2015-05-21 LAB — VALPROIC ACID LEVEL: Valproic Acid Lvl: 58.8 ug/mL (ref 50.0–100.0)

## 2015-05-22 ENCOUNTER — Ambulatory Visit (HOSPITAL_COMMUNITY): Payer: Self-pay | Admitting: Psychiatry

## 2015-05-23 ENCOUNTER — Ambulatory Visit (HOSPITAL_COMMUNITY): Payer: Self-pay | Admitting: Psychiatry

## 2015-05-24 ENCOUNTER — Ambulatory Visit (INDEPENDENT_AMBULATORY_CARE_PROVIDER_SITE_OTHER): Payer: Medicare HMO | Admitting: Psychiatry

## 2015-05-24 ENCOUNTER — Encounter (HOSPITAL_COMMUNITY): Payer: Self-pay | Admitting: Psychiatry

## 2015-05-24 VITALS — BP 115/63 | HR 80 | Ht 70.0 in | Wt 136.4 lb

## 2015-05-24 DIAGNOSIS — F332 Major depressive disorder, recurrent severe without psychotic features: Secondary | ICD-10-CM
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
## 2015-06-12 ENCOUNTER — Telehealth: Payer: Self-pay | Admitting: Cardiovascular Disease

## 2015-06-12 NOTE — Telephone Encounter (Signed)
Received [facility] from [facility] [place] for appointment with Dr Purvis SheffieldKoneswaran on 06/18/15 @ [facility].  [facility] on 06/12/15.  lp

## 2015-06-16 ENCOUNTER — Ambulatory Visit: Payer: Self-pay | Admitting: Cardiovascular Disease

## 2015-06-17 ENCOUNTER — Encounter (HOSPITAL_COMMUNITY): Payer: Self-pay | Admitting: Psychiatry

## 2015-06-17 ENCOUNTER — Ambulatory Visit (INDEPENDENT_AMBULATORY_CARE_PROVIDER_SITE_OTHER): Payer: Medicare HMO | Admitting: Psychiatry

## 2015-06-17 DIAGNOSIS — F332 Major depressive disorder, recurrent severe without psychotic features: Secondary | ICD-10-CM | POA: Diagnosis not present
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
## 2015-06-17 NOTE — Patient Instructions (Signed)
Discussed orally 

## 2015-06-18 ENCOUNTER — Ambulatory Visit (INDEPENDENT_AMBULATORY_CARE_PROVIDER_SITE_OTHER): Payer: Medicare HMO | Admitting: Cardiovascular Disease

## 2015-06-18 ENCOUNTER — Encounter: Payer: Self-pay | Admitting: Cardiovascular Disease

## 2015-06-18 VITALS — BP 108/60 | HR 67 | Ht 70.0 in | Wt 139.0 lb

## 2015-06-18 DIAGNOSIS — Z720 Tobacco use: Secondary | ICD-10-CM | POA: Diagnosis not present

## 2015-06-18 DIAGNOSIS — E785 Hyperlipidemia, unspecified: Secondary | ICD-10-CM | POA: Diagnosis not present

## 2015-06-18 DIAGNOSIS — R208 Other disturbances of skin sensation: Secondary | ICD-10-CM

## 2015-06-18 DIAGNOSIS — I739 Peripheral vascular disease, unspecified: Secondary | ICD-10-CM | POA: Diagnosis not present

## 2015-06-18 DIAGNOSIS — R200 Anesthesia of skin: Secondary | ICD-10-CM

## 2015-06-18 HISTORY — DX: Peripheral vascular disease, unspecified: I73.9

## 2015-06-18 NOTE — Patient Instructions (Signed)
Your physician has requested that you have an ankle brachial index (ABI). During this test an ultrasound and blood pressure cuff are used to evaluate the arteries that supply the arms and legs with blood. Allow thirty minutes for this exam. There are no restrictions or special instructions. Office will contact with results via phone or letter.   Continue all current medications. Follow up to be determined

## 2015-06-18 NOTE — Progress Notes (Signed)
Patient ID: Jeffrey [name], male   DOB: 1961/03/02, 54 y.o.   MRN: [id number]       CARDIOLOGY CONSULT NOTE  Patient ID: Jeffrey [name] MRN: [id number] DOB/AGE: 1961/04/13 54 y.o.  Admit date: (Not on file) Primary Physician Jeffrey Hoit, MD  Reason for Consultation: [name]  HPI: The patient is a 54 year old male with a long-standing history of tobacco abuse was been referred for the evaluation of peripheral vascular disease.   He has been complaining of distal right leg numbness from the midcalf down to his foot. It has been going on for roughly 5 weeks. He has also noted hair loss in this region. He has been advised to quit smoking in the past but he has not complied.   He also has a history of bipolar disorder and hyperlipidemia.   He has been noted by his PCP to have diminished posterior tibial and dorsalis pedis pulses in the right leg when compared to the left.  He underwent a low risk nuclear stress test in April 2006.  He lost his left great toe due to a  lawn mowing accident. He has had some distal right foot numbness which is always present. He has a history of chronic back disease with bulging disks. He denies claudication pain. He has left calf cramping at night intermittently. He denies exertional chest pain.  He smokes 2 packs per day and has done so for 40 years.   Allergies  Allergen Reactions  . Augmentin [Amoxicillin-Pot Clavulanate] Rash  . Ace Inhibitors     Current Outpatient Prescriptions  Medication Sig Dispense Refill  . atorvastatin (LIPITOR) 20 MG tablet Take 20 mg by mouth daily.     . divalproex (DEPAKOTE) 250 MG DR tablet Take three at bedtime 90 tablet 2  . fenofibrate micronized (LOFIBRA) 134 MG capsule Take 134 mg by mouth daily before breakfast.     . FLUoxetine (PROZAC) 40 MG capsule Take 1 capsule (40 mg total) by mouth every morning. 30 capsule 3  . OLANZapine (ZYPREXA) 10 MG tablet Take 1 tablet (10 mg total) by mouth at bedtime. 30  tablet 2  . TraZODone HCl 150 MG TB24 Take 2 at bedtime 60 tablet 2   No current facility-administered medications for this visit.    Past Medical History  Diagnosis Date  . Hyperlipidemia   . Bipolar disorder   . Peptic ulcer disease     Review  . Shortness of breath   . Pneumonia   . Depression     anxiety  . Anxiety   . Arthritis     deg disease, bulging disk,  shoulder level  . [name] (peripheral vascular disease) 06/18/2015    Past Surgical History  Procedure Laterality Date  . Esophagogastroduodenoscopy  03/14/2007    [id number]:[id number] antral gastritis with bulbar duodenitis/paucity to postbulbar duodenal folds and biopsy were benign with no evidence of villous atrophy.  . Colonoscopy  03/14/2007    HQI:[id number] colonoscopy and terminal ileoscopy except external hemorrhoids  . Hand surgery      left, secondary to self-inflicted laceration  . Toe surgery      left great toe , amputated-lawnmover accident  . Shoulder surgery      right  . Colonoscopy N/A 05/05/2013    Procedure: COLONOSCOPY;  Surgeon: [name], MD;  [facility];  Service: Endoscopy;  Laterality: N/A;  1:30    History   Social History  . Marital Status: Married    Spouse  Name: N/A  . Number of Children: 2  . Years of Education: N/A   Occupational History  . Disabled    Social History Main Topics  . Smoking status: Current Every Day Smoker -- 2.00 packs/day for 40 years    Types: Cigarettes  . Smokeless tobacco: Never Used  . Alcohol Use: No     Comment: last used 2 nights ago, smokes a joint about every 3 days  . Drug Use: Yes    Special: Marijuana     Comment: still doing some  . Sexual Activity: No   Other Topics Concern  . Not on file   Social History Narrative     No family history of premature CAD in 1st degree relatives.  Prior to Admission medications   Medication Sig Start Date End Date Taking? Authorizing Provider  atorvastatin (LIPITOR) 20 MG tablet Take 20  mg by mouth daily.  04/20/13  Yes Historical Provider, MD  divalproex (DEPAKOTE) 250 MG DR tablet Take three at bedtime 04/24/15  Yes Myrlene Broker, MD  fenofibrate micronized (LOFIBRA) 134 MG capsule Take 134 mg by mouth daily before breakfast.  04/24/13  Yes Historical Provider, MD  FLUoxetine (PROZAC) 40 MG capsule Take 1 capsule (40 mg total) by mouth every morning. 04/24/15  Yes Myrlene Broker, MD  OLANZapine (ZYPREXA) 10 MG tablet Take 1 tablet (10 mg total) by mouth at bedtime. 04/24/15 04/23/16 Yes Myrlene Broker, MD  TraZODone HCl 150 MG TB24 Take 2 at bedtime 04/24/15  Yes Myrlene Broker, MD     Review of systems complete and found to be negative unless listed above in HPI     Physical exam Blood pressure 108/60, pulse 67, height 5\' 10"  (1.778 m), weight 139 lb (63.05 kg), SpO2 95 %. General: NAD Neck: No JVD, no thyromegaly or thyroid nodule.  Lungs: Diminished sounds but clear without rales or wheezes. CV: Nondisplaced PMI. Regular rate and rhythm, normal S1/S2, no S3/S4, no murmur.  No peripheral edema.  No carotid bruit.  Both dorsalis pedis and posterior tibial pulses are appreciated in both feet. Both feet are warm to palpation. Distal pretibial hair loss noted in right moreso than left foot.  Abdomen: Soft, nontender, no distention.  Skin: Intact without lesions or rashes.  Neurologic: Alert and oriented x 3.  Psych: Normal affect. Extremities: No clubbing or cyanosis.  HEENT: Normal.   ECG: Most recent ECG reviewed.  Labs:   Lab Results  Component Value Date   WBC 5.4 05/02/2013   HGB 13.4 05/02/2013   HCT 39.7 05/02/2013   MCV 86.7 05/02/2013   PLT 251 05/02/2013   No results for input(s): NA, K, CL, CO2, BUN, CREATININE, CALCIUM, PROT, BILITOT, ALKPHOS, ALT, AST, GLUCOSE in the last 168 hours.  Invalid input(s): LABALBU No results found for: CKTOTAL, CKMB, CKMBINDEX, TROPONINI No results found for: CHOL No results found for: HDL No results found for: LDLCALC No  results found for: TRIG No results found for: CHOLHDL No results found for: LDLDIRECT       Studies: No results found.  ASSESSMENT AND PLAN:  1. [name]/right foot numbness:  His right foot numbness may very well be likely due to lumbar spine disc disease with neuropraxia. Given that both feet are warm and both dorsalis pedis and posterior tibial pulses are appreciated in both feet , it makes the likelihood of obstructive peripheral vascular disease less likely. It is possible he has collateral circulation development. Given his long history  of tobacco abuse, I will proceed with ABIs with lower extremity arterial Doppler.  2. Tobacco abuse disorder.  3. Hyperlipidemia: On Lipitor 20 mg.  Dispo: f/u to be determined after ABI's.  Signed: Prentice Docker, M.D., F.A.C.C.  06/18/2015, 2:40 PM

## 2015-06-27 ENCOUNTER — Ambulatory Visit (INDEPENDENT_AMBULATORY_CARE_PROVIDER_SITE_OTHER): Payer: Medicare HMO | Admitting: Psychiatry

## 2015-06-27 ENCOUNTER — Encounter (HOSPITAL_COMMUNITY): Payer: Self-pay | Admitting: Psychiatry

## 2015-06-27 VITALS — BP 125/62 | HR 58 | Ht 70.0 in | Wt 139.6 lb

## 2015-06-27 DIAGNOSIS — F332 Major depressive disorder, recurrent severe without psychotic features: Secondary | ICD-10-CM

## 2015-06-27 MED ORDER — FLUOXETINE HCL 40 MG PO CAPS: 40.0000 mg | ORAL_CAPSULE | ORAL | Status: DC

## 2015-06-27 MED ORDER — OLANZAPINE 10 MG PO TABS: 10.0000 mg | ORAL_TABLET | Freq: Every day | ORAL | Status: DC

## 2015-06-27 MED ORDER — TRAZODONE HCL ER 150 MG PO TB24: ORAL_TABLET | ORAL | Status: DC

## 2015-06-27 MED ORDER — DIVALPROEX SODIUM 250 MG PO DR TAB: DELAYED_RELEASE_TABLET | ORAL | Status: DC
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
## 2015-07-04 ENCOUNTER — Ambulatory Visit (INDEPENDENT_AMBULATORY_CARE_PROVIDER_SITE_OTHER): Payer: Medicare HMO

## 2015-07-04 DIAGNOSIS — I739 Peripheral vascular disease, unspecified: Secondary | ICD-10-CM

## 2015-07-06 ENCOUNTER — Emergency Department (HOSPITAL_COMMUNITY): Payer: Medicare HMO

## 2015-07-06 ENCOUNTER — Encounter (HOSPITAL_COMMUNITY): Payer: Self-pay | Admitting: *Deleted

## 2015-07-06 ENCOUNTER — Emergency Department (HOSPITAL_COMMUNITY)
Admission: EM | Admit: 2015-07-06 | Discharge: 2015-07-07 | Disposition: A | Discharge status: Home / Self Care | Payer: Medicare HMO | Attending: Emergency Medicine | Admitting: Emergency Medicine

## 2015-07-06 DIAGNOSIS — Y9389 Activity, other specified: Secondary | ICD-10-CM | POA: Diagnosis not present

## 2015-07-06 DIAGNOSIS — Y9289 Other specified places as the place of occurrence of the external cause: Secondary | ICD-10-CM | POA: Diagnosis not present

## 2015-07-06 DIAGNOSIS — S82431A Displaced oblique fracture of shaft of right fibula, initial encounter for closed fracture: Secondary | ICD-10-CM | POA: Diagnosis not present

## 2015-07-06 DIAGNOSIS — F319 Bipolar disorder, unspecified: Secondary | ICD-10-CM | POA: Diagnosis not present

## 2015-07-06 DIAGNOSIS — S99911A Unspecified injury of right ankle, initial encounter: Secondary | ICD-10-CM | POA: Diagnosis not present

## 2015-07-06 DIAGNOSIS — Z8711 Personal history of peptic ulcer disease: Secondary | ICD-10-CM | POA: Diagnosis not present

## 2015-07-06 DIAGNOSIS — M79671 Pain in right foot: Secondary | ICD-10-CM

## 2015-07-06 DIAGNOSIS — Z8739 Personal history of other diseases of the musculoskeletal system and connective tissue: Secondary | ICD-10-CM | POA: Diagnosis not present

## 2015-07-06 DIAGNOSIS — Z720 Tobacco use: Secondary | ICD-10-CM | POA: Diagnosis not present

## 2015-07-06 DIAGNOSIS — W108XXA Fall (on) (from) other stairs and steps, initial encounter: Secondary | ICD-10-CM | POA: Insufficient documentation

## 2015-07-06 DIAGNOSIS — Z8701 Personal history of pneumonia (recurrent): Secondary | ICD-10-CM | POA: Diagnosis not present

## 2015-07-06 DIAGNOSIS — Y998 Other external cause status: Secondary | ICD-10-CM | POA: Insufficient documentation

## 2015-07-06 DIAGNOSIS — E785 Hyperlipidemia, unspecified: Secondary | ICD-10-CM | POA: Diagnosis not present

## 2015-07-06 DIAGNOSIS — S82831A Other fracture of upper and lower end of right fibula, initial encounter for closed fracture: Secondary | ICD-10-CM

## 2015-07-06 DIAGNOSIS — S99921A Unspecified injury of right foot, initial encounter: Secondary | ICD-10-CM | POA: Diagnosis present

## 2015-07-06 DIAGNOSIS — Z79899 Other long term (current) drug therapy: Secondary | ICD-10-CM | POA: Insufficient documentation

## 2015-07-06 DIAGNOSIS — F419 Anxiety disorder, unspecified: Secondary | ICD-10-CM | POA: Insufficient documentation

## 2015-07-06 MED ORDER — NAPROXEN 250 MG PO TABS
500.0000 mg | ORAL_TABLET | Freq: Once | ORAL | Status: AC
  Administered 2015-07-06: 500 mg via ORAL
  Filled 2015-07-06: qty 2

## 2015-07-06 MED ORDER — OXYCODONE-ACETAMINOPHEN 5-325 MG PO TABS
1.0000 | ORAL_TABLET | Freq: Once | ORAL | Status: AC
  Administered 2015-07-06: 1 via ORAL
  Filled 2015-07-06: qty 1

## 2015-07-06 NOTE — ED Provider Notes (Signed)
CSN: [id number]     Arrival date & time 07/06/15  2159 History  This chart was scribed for Devoria Albe, MD by Budd Palmer, ED Scribe. This patient was seen in room [facility] and the patient's care was started at 11:14 PM.    Chief Complaint  Patient presents with  . Foot Pain   The history is provided by the patient. No language interpreter was used.   HPI Comments: Jeffrey [name] is a 54 y.o. male smoker at 2 ppd who presents to the Emergency Department complaining of constant, aching right foot pain onset after tripping and falling down 7 stairs yesterday evening. He denies hitting his head or LOC. He states that the pain is along the MTP of the Rt great toe of the foot and on both sides of the right ankle. He is unable to bear weight or walk on it. He states that he recently saw the cardiologist for numbness down the outside of his right lower leg, but he is having pain in his Rt ankle and foot since he fell. Marland Kitchen He is on mental disability. Pt denies knee pain. He denies pain anywhere other than his ankle and foot.  PCP Dr Kelby Fam  Past Medical History  Diagnosis Date  . Hyperlipidemia   . Bipolar disorder   . Peptic ulcer disease     Review  . Shortness of breath   . Pneumonia   . Depression     anxiety  . Anxiety   . Arthritis     deg disease, bulging disk,  shoulder level  . [name] (peripheral vascular disease) 06/18/2015   Past Surgical History  Procedure Laterality Date  . Esophagogastroduodenoscopy  03/14/2007    [id number] antral gastritis with bulbar duodenitis/paucity to postbulbar duodenal folds and biopsy were benign with no evidence of villous atrophy.  . Colonoscopy  03/14/2007    FAO:ZHYQMV colonoscopy and terminal ileoscopy except external hemorrhoids  . Hand surgery      left, secondary to self-inflicted laceration  . Toe surgery      left great toe , amputated-lawnmover accident  . Shoulder surgery      right  . Colonoscopy N/A 05/05/2013    Procedure:  COLONOSCOPY;  Surgeon: [name], MD;  Location[facility];  Service: Endoscopy;  Laterality: N/A;  1:30   Family History  Problem Relation Age of Onset  . Colon cancer Neg Hx   . Liver disease Neg Hx   . Breast cancer Mother     deceased  . Heart disease Father   . Depression Daughter   . Anxiety disorder Daughter   . Anxiety disorder Son   . Depression Son    Social History  Substance Use Topics  . Smoking status: Current Every Day Smoker -- 2.00 packs/day for 40 years    Types: Cigarettes  . Smokeless tobacco: Never Used  . Alcohol Use: No     Comment: last used 2 nights ago, smokes a joint about every 3 days  on disability for "mental"  Review of Systems  Musculoskeletal: Positive for myalgias and arthralgias.  All other systems reviewed and are negative.  Allergies  Augmentin and Ace inhibitors  Home Medications   Prior to Admission medications   Medication Sig Start Date End Date Taking? Authorizing Provider  atorvastatin (LIPITOR) 20 MG tablet Take 20 mg by mouth daily.  04/20/13   Historical Provider, MD  divalproex (DEPAKOTE) 250 MG DR tablet Take three at bedtime 06/27/15  Myrlene Broker, MD  fenofibrate micronized (LOFIBRA) 134 MG capsule Take 134 mg by mouth daily before breakfast.  04/24/13   Historical Provider, MD  FLUoxetine (PROZAC) 40 MG capsule Take 1 capsule (40 mg total) by mouth every morning. 06/27/15   Myrlene Broker, MD  OLANZapine (ZYPREXA) 10 MG tablet Take 1 tablet (10 mg total) by mouth at bedtime. 06/27/15 06/26/16  Myrlene Broker, MD  TraZODone HCl 150 MG TB24 Take 2 at bedtime 06/27/15   Myrlene Broker, MD   BP 111/48 mmHg  Pulse 73  Temp(Src) 99.2 F (37.3 C) (Oral)  Resp 18  Ht 5\' 10"  (1.778 m)  Wt 139 lb (63.05 kg)  BMI 19.94 kg/m2  SpO2 97%  Vital signs normal   Physical Exam  Constitutional: He is oriented to person, place, and time. He appears well-developed and well-nourished.  Non-toxic appearance. He does not appear ill. No  distress.  HENT:  Head: Normocephalic and atraumatic.  Right Ear: External ear normal.  Left Ear: External ear normal.  Nose: Nose normal. No mucosal edema or rhinorrhea.  Mouth/Throat: Mucous membranes are normal. No dental abscesses or uvula swelling.  Eyes: Conjunctivae and EOM are normal.  Neck: Normal range of motion and full passive range of motion without pain.  Pulmonary/Chest: Effort normal. No respiratory distress. He has no rhonchi. He exhibits no crepitus.  Abdominal: Normal appearance.  Musculoskeletal: Normal range of motion. He exhibits edema and tenderness.  Tender over the R great toe at the MTP joint. He has some mild swelling and redness over that area. Non-tender R knee. No effusion. Non-tender R lower extremity until the level of the lower ankle. Diffuse swelling of both malleoli, L worse than R. TTP diffusely over both malleoli with bruising under the lateral malleolus. Good DP pulse.   Neurological: He is alert and oriented to person, place, and time. He has normal strength. No cranial nerve deficit.  Skin: Skin is warm, dry and intact. No rash noted. No erythema. No pallor.  Psychiatric: He has a normal mood and affect. His speech is normal and behavior is normal. His mood appears not anxious.  Nursing note and vitals reviewed.   ED Course  Procedures   Medications  oxyCODONE-acetaminophen (PERCOCET/ROXICET) 5-325 MG per tablet 1 tablet (1 tablet Oral Given 07/06/15 2325)  naproxen (NAPROSYN) tablet 500 mg (500 mg Oral Given 07/06/15 2325)    DIAGNOSTIC STUDIES: Oxygen Saturation is 97% on RA, adequate by my interpretation.    COORDINATION OF CARE: 11:22 PM - Discussed imaging results of and ankle Fx. Discussed plans to order a boot. Discussed plans to order diagnostic studies to check for uric acid and possible gout. Pt advised of plan for treatment and pt agrees.  12:40 AM - Discussed normal diagnostic studies and radiologist results of fibula Fx. Pt was  placed in a Cam Walker and given referral to orthopedics. He was able to ambulate and states the Cam Walker his meatus pain improved.  Labs Review Labs Reviewed  URIC ACID - Abnormal; Notable for the following:    Uric Acid, Serum 2.0 (*)    All other components within normal limits   Laboratory interpretation all normal    Imaging Review Dg Ankle Complete Right  07/06/2015   CLINICAL DATA:  Status post fall, with injury to right foot and ankle. Swelling and bruising about the ankle. Initial encounter.  EXAM: RIGHT ANKLE - COMPLETE 3+ VIEW  COMPARISON:  None.  FINDINGS: There is a  minimally displaced slightly comminuted oblique fracture through the distal fibula. The interosseous space appears grossly intact. The ankle mortise is grossly preserved at this time. No talar tilt or subluxation is seen.  The joint spaces are preserved. Mild soft tissue swelling is noted overlying the lateral malleolus.  IMPRESSION: Minimally displaced slightly comminuted oblique fracture through the distal fibula.   Electronically Signed   By: Roanna Raider M.D.   On: 07/06/2015 23:36   Dg Foot Complete Right  07/06/2015   CLINICAL DATA:  Status post fall, with swelling and bruising about the right ankle. Initial encounter.  EXAM: RIGHT FOOT COMPLETE - 3+ VIEW  COMPARISON:  None.  FINDINGS: There is a minimally displaced fracture involving the distal fibula, better characterized on ankle radiographs.  There is no additional evidence of fracture. A bipartite medial sesamoid of the first toe is noted. A small osseous fragment distal to the medial malleolus may reflect remote injury.  There is no evidence of talar subluxation; the subtalar joint is unremarkable in appearance. Mild lateral soft tissue swelling is noted.  IMPRESSION: 1. Minimally displaced fracture involving the distal fibula, better characterized on ankle radiographs. 2. Bipartite medial sesamoid of the first toe.   Electronically Signed   By: Roanna Raider  M.D.   On: 07/06/2015 23:37     EKG Interpretation None      MDM   Final diagnoses:  Traumatic closed nondisplaced fracture of distal fibula, right, initial encounter    Discharge Medication List as of 07/07/2015 12:27 AM    START taking these medications   Details  naproxen (NAPROSYN) 500 MG tablet Take 1 po BID with food prn pain, Print    !! oxyCODONE-acetaminophen (PERCOCET/ROXICET) 5-325 MG per tablet Take 1 tablet by mouth every 6 (six) hours as needed for severe pain., Starting 07/07/2015, Until Discontinued, Print    !! oxyCODONE-acetaminophen (PERCOCET/ROXICET) 5-325 MG per tablet Take 1-2 tablets by mouth every 6 (six) hours as needed for severe pain., Starting 07/07/2015, Until Discontinued, Print     !! - Potential duplicate medications found. Please discuss with provider.      Plan discharge  Devoria Albe, MD, FACEP   I personally performed the services described in this documentation, which was scribed in my presence. The recorded information has been reviewed and considered.  Devoria Albe, MD, Concha Pyo, MD 07/07/15 [id number]

## 2015-07-06 NOTE — ED Notes (Signed)
Pt fell down several stairs yesterday after loosing footing, denies hitting head, denies taking any blood thinners

## 2015-07-07 LAB — URIC ACID: Uric Acid, Serum: 2 mg/dL — ABNORMAL LOW (ref 4.4–7.6)

## 2015-07-07 MED ORDER — OXYCODONE-ACETAMINOPHEN 5-325 MG PO TABS: 1.0000 | ORAL_TABLET | Freq: Four times a day (QID) | ORAL | Status: DC | PRN

## 2015-07-07 MED ORDER — NAPROXEN 500 MG PO TABS: ORAL_TABLET | ORAL | Status: DC

## 2015-07-07 NOTE — Discharge Instructions (Signed)
Elevate your foot. Use ice packs to help with the swelling and pain. Take the medication as prescribed. Call Dr Mort Sawyers office on Monday August 15, to get an appointment to have you rechecked in the next week.  Ankle Fracture A fracture is a break in a bone. A cast or splint may be used to protect the ankle and heal the break. Sometimes, surgery is needed. HOME CARE  Use crutches as told by your doctor. It is very important that you use your crutches correctly.  Do not put weight or pressure on the injured ankle until told by your doctor.  Keep your ankle raised (elevated) when sitting or lying down.  Apply ice to the ankle:  Put ice in a plastic bag.  Place a towel between your cast and the bag.  Leave the ice on for 20 minutes, 2-3 times a day.  If you have a plaster or fiberglass cast:  Do not try to scratch under the cast with any objects.  Check the skin around the cast every day. You may put lotion on red or sore areas.  Keep your cast dry and clean.  If you have a plaster splint:  Wear the splint as told by your doctor.  You can loosen the elastic around the splint if your toes get numb, tingle, or turn cold or blue.  Do not put pressure on any part of your cast or splint. It may break. Rest your plaster splint or cast only on a pillow the first 24 hours until it is fully hardened.  Cover your cast or splint with a plastic bag during showers.  Do not lower your cast or splint into water.  Take medicine as told by your doctor.  Do not drive until your doctor says it is safe.  Follow-up with your doctor as told. It is very important that you go to your follow-up visits. GET HELP IF: The swelling and discomfort gets worse.  GET HELP RIGHT AWAY IF:   Your splint or cast breaks.  You continue to have very bad pain.  You have new pain or swelling after your splint or cast was put on.  Your skin or toes below the injured ankle:  Turn blue or gray.  Feel  cold, numb, or you cannot feel them.  There is a bad smell or yellowish white fluid (pus) coming from under the splint or cast. MAKE SURE YOU:   Understand these instructions.  Will watch your condition.  Will get help right away if you are not doing well or get worse. Document Released: 09/06/2009 Document Revised: 08/30/2013 Document Reviewed: 06/08/2013 [facility] Patient Information 2015 [place], [place]. This information is not intended to replace advice given to you by your health care provider. Make sure you discuss any questions you have with your health care provider.  Cryotherapy Cryotherapy is when you put ice on your injury. Ice helps lessen pain and puffiness (swelling) after an injury. Ice works the best when you start using it in the first 24 to 48 hours after an injury. HOME CARE  Put a dry or damp towel between the ice pack and your skin.  You may press gently on the ice pack.  Leave the ice on for no more than 10 to 20 minutes at a time.  Check your skin after 5 minutes to make sure your skin is okay.  Rest at least 20 minutes between ice pack uses.  Stop using ice when your skin loses feeling (numbness).  Do not use ice on someone who cannot tell you when it hurts. This includes small children and people with memory problems (dementia). GET HELP RIGHT AWAY IF:  You have white spots on your skin.  Your skin turns blue or pale.  Your skin feels waxy or hard.  Your puffiness gets worse. MAKE SURE YOU:   Understand these instructions.  Will watch your condition.  Will get help right away if you are not doing well or get worse. Document Released: 04/27/2008 Document Revised: 02/01/2012 Document Reviewed: 07/02/2011 [facility] Patient Information 2015 [place], [place]. This information is not intended to replace advice given to you by your health care provider. Make sure you discuss any questions you have with your health care provider.  Fibular Fracture,  Ankle, Adult, Undisplaced, Treated With Immobilization A simple fracture of the bone below the knee on the outside of your leg (fibula) usually heals without problems. CAUSES Typically, a fibular fracture occurs as a result of trauma. A blow to the side of your leg or a powerful twisting movement can cause a fracture. Fibular fractures are often seen as a result of football, soccer, or skiing injuries. SYMPTOMS Symptoms of a fibular fracture can include:  Pain.  Shortening or abnormal alignment of your lower leg (angulation). DIAGNOSIS A health care provider will need to examine the leg. X-ray exams will be ordered for further to confirm the fracture and evaluate the extent and of the injury. TREATMENT  Typically, a cast or immobilizer is applied. Sometimes a splint is placed on these fractures if it is needed for comfort or if the bones are badly out of place. Crutches may be needed to help you get around.  HOME CARE INSTRUCTIONS   Apply ice to the injured area:  Put ice in a plastic bag.  Place a towel between your skin and the bag.  Leave the ice on for 20 minutes, 2-3 times a day.  Use crutches as directed. Resume walking without crutches as directed by your health care provider or when comfortable doing so.  Only take over-the-counter or prescription medicines for pain, discomfort, or fever as directed by your health care provider.  Keeping your leg raised may lessen swelling.  If you have a removable splint or boot, do not remove the boot unless directed by your health care provider.  Do not not drive a car or operate a motor vehicle until your health care provider specifically tells you it is safe to do so. SEEK IMMEDIATE MEDICAL CARE IF:   Your cast gets damaged or breaks.  You have continued severe pain or more swelling than you did before the cast was put on, or the pain is not controlled with medications.  Your skin or nails below the injury turn blue or grey, or  feel cold or numb.  There is a bad smell or pus coming from under the cast.  You develop severe pain in ankle or foot. MAKE SURE YOU:   Understand these instructions.  Will watch your condition.  Will get help right away if you are not doing well or get worse. Document Released: 08/01/2002 Document Revised: 08/30/2013 Document Reviewed: 06/21/2013 [facility] Patient Information 2015 [place], [place]. This information is not intended to replace advice given to you by your health care provider. Make sure you discuss any questions you have with your health care provider.

## 2015-07-08 ENCOUNTER — Ambulatory Visit (INDEPENDENT_AMBULATORY_CARE_PROVIDER_SITE_OTHER): Payer: Medicare HMO | Admitting: Orthopedic Surgery

## 2015-07-08 ENCOUNTER — Encounter: Payer: Self-pay | Admitting: Orthopedic Surgery

## 2015-07-08 VITALS — BP 103/52 | Ht 70.0 in | Wt 139.0 lb

## 2015-07-08 DIAGNOSIS — S82401A Unspecified fracture of shaft of right fibula, initial encounter for closed fracture: Secondary | ICD-10-CM

## 2015-07-08 MED ORDER — HYDROCODONE-ACETAMINOPHEN 7.5-325 MG PO TABS: 1.0000 | ORAL_TABLET | ORAL | Status: DC | PRN
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
## 2015-07-10 ENCOUNTER — Telehealth: Payer: Self-pay | Admitting: *Deleted

## 2015-07-10 NOTE — Telephone Encounter (Signed)
Please see results (blue hyperlink) regarding this result.    Initially your notes were:  Notes Recorded by Laqueta Linden, MD on 07/04/2015 at 4:10 PM Unable to open attachment to view results.  There are two more blue hyperlinks under results that you can click on.  Not sure what happened here.   Was doing follow up & realized no disposition had been added to results.

## 2015-07-12 ENCOUNTER — Telehealth (HOSPITAL_COMMUNITY): Payer: Self-pay | Admitting: *Deleted

## 2015-07-12 MED ORDER — TRAZODONE HCL ER 150 MG PO TB24: ORAL_TABLET | ORAL | Status: DC

## 2015-07-12 MED ORDER — DIVALPROEX SODIUM 250 MG PO DR TAB: DELAYED_RELEASE_TABLET | ORAL | Status: DC

## 2015-07-12 MED ORDER — FLUOXETINE HCL 40 MG PO CAPS: 40.0000 mg | ORAL_CAPSULE | ORAL | Status: DC

## 2015-07-12 NOTE — Telephone Encounter (Signed)
Pt new pharmacy [facility]) called requesting 90 days supply for pt Prozac, Trazodone and Depokote. Per Amil Amen, pt was at a local pharmacy that was just 1 month at a time. Informed Amil Amen that pt 90 days script will be sent to them via escript to make sure it's documented. Amil Amen had pt on conference all when she called office. Both pt and Amil Amen showed understanding.

## 2015-07-12 NOTE — Telephone Encounter (Signed)
noted 

## 2015-07-18 NOTE — Telephone Encounter (Addendum)
ABI results normal per Dr. Purvis Sheffield.  PRN follow up.

## 2015-07-18 NOTE — Telephone Encounter (Signed)
Left message to return call 

## 2015-07-22 ENCOUNTER — Telehealth (HOSPITAL_COMMUNITY): Payer: Self-pay | Admitting: *Deleted

## 2015-07-22 MED ORDER — TRAZODONE HCL ER 150 MG PO TB24: ORAL_TABLET | ORAL | Status: DC

## 2015-07-22 MED ORDER — DIVALPROEX SODIUM 250 MG PO DR TAB: DELAYED_RELEASE_TABLET | ORAL | Status: DC

## 2015-07-22 NOTE — Telephone Encounter (Signed)
When pt Trazodone and Depakote was sent via escript, the wrong quantity was sent. Pt pharmacy sent request asking for the right quantity for pt medications. The right quantity was sent to pt pharmacy via escript

## 2015-07-23 ENCOUNTER — Ambulatory Visit: Payer: Self-pay | Admitting: Cardiovascular Disease

## 2015-07-23 NOTE — Telephone Encounter (Signed)
Patient notified.  Results fwd to PMD.

## 2015-07-24 MED FILL — Oxycodone w/ Acetaminophen Tab 5-325 MG: ORAL | Qty: 6 | Status: AC

## 2015-08-05 ENCOUNTER — Ambulatory Visit (INDEPENDENT_AMBULATORY_CARE_PROVIDER_SITE_OTHER): Payer: Medicare HMO

## 2015-08-05 ENCOUNTER — Ambulatory Visit (INDEPENDENT_AMBULATORY_CARE_PROVIDER_SITE_OTHER): Payer: Medicare HMO | Admitting: Orthopedic Surgery

## 2015-08-05 VITALS — BP 109/68 | Ht 70.0 in | Wt 139.0 lb

## 2015-08-05 DIAGNOSIS — S82831A Other fracture of upper and lower end of right fibula, initial encounter for closed fracture: Secondary | ICD-10-CM

## 2015-08-05 DIAGNOSIS — S82491A Other fracture of shaft of right fibula, initial encounter for closed fracture: Secondary | ICD-10-CM | POA: Diagnosis not present

## 2015-08-05 DIAGNOSIS — S82401D Unspecified fracture of shaft of right fibula, subsequent encounter for closed fracture with routine healing: Secondary | ICD-10-CM

## 2015-08-05 NOTE — Progress Notes (Signed)
FRACTURE CARE   Patient ID: YACOUB DILTZ, male   DOB: 09/04/1961, 54 y.o.   MRN: [id number]  Chief Complaint  Patient presents with  . Follow-up    4 week follow up + xray right ankle, DOI 07/06/15    DX right fibular fracture lateral malleolus  TREATMENT walking Cam Walker  PAIN MEDS: Norco 7.5 last recorded prescription  WEIGHT BEARING STATUS as tolerated  XRAYS my interpretation of today's films are healing fibular fracture lateral malleolus nondisplaced ankle mortise intact  EXAM tenderness at the fracture site foot alignment normal  BP 109/68 mmHg  Ht  (1.778 m)  Wt 139 lb (63.05 kg)  BMI 19.94 kg/m2      ASSESSMENT AND PLAN   X-ray in 4 weeks continue brace Cam Walker weightbearing as tolerated

## 2015-08-27 ENCOUNTER — Ambulatory Visit (INDEPENDENT_AMBULATORY_CARE_PROVIDER_SITE_OTHER): Payer: Medicare HMO | Admitting: Psychiatry

## 2015-08-27 ENCOUNTER — Encounter (HOSPITAL_COMMUNITY): Payer: Self-pay | Admitting: Psychiatry

## 2015-08-27 ENCOUNTER — Ambulatory Visit (HOSPITAL_COMMUNITY): Payer: Self-pay | Admitting: Psychiatry

## 2015-08-27 VITALS — BP 125/74 | HR 73 | Ht 70.0 in | Wt 139.0 lb

## 2015-08-27 DIAGNOSIS — F332 Major depressive disorder, recurrent severe without psychotic features: Secondary | ICD-10-CM
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
## 2015-08-30 ENCOUNTER — Telehealth (HOSPITAL_COMMUNITY): Payer: Self-pay | Admitting: *Deleted

## 2015-08-30 MED ORDER — TRAZODONE HCL ER 150 MG PO TB24: ORAL_TABLET | ORAL | Status: DC

## 2015-08-30 NOTE — Telephone Encounter (Addendum)
Pt called in refer to get refills for his Trazodone

## 2015-09-03 ENCOUNTER — Ambulatory Visit: Payer: Self-pay | Admitting: Orthopedic Surgery

## 2015-09-03 ENCOUNTER — Encounter: Payer: Self-pay | Admitting: Orthopedic Surgery

## 2015-11-27 ENCOUNTER — Encounter (HOSPITAL_COMMUNITY): Payer: Self-pay | Admitting: Psychiatry

## 2015-11-27 ENCOUNTER — Ambulatory Visit (INDEPENDENT_AMBULATORY_CARE_PROVIDER_SITE_OTHER): Payer: Medicare HMO | Admitting: Psychiatry

## 2015-11-27 VITALS — BP 145/70 | Ht 70.0 in | Wt 145.0 lb

## 2015-11-27 DIAGNOSIS — F332 Major depressive disorder, recurrent severe without psychotic features: Secondary | ICD-10-CM

## 2015-11-27 MED ORDER — DIVALPROEX SODIUM 250 MG PO DR TAB: DELAYED_RELEASE_TABLET | ORAL | Status: DC

## 2015-11-27 MED ORDER — TEMAZEPAM 30 MG PO CAPS: 30.0000 mg | ORAL_CAPSULE | Freq: Every evening | ORAL | Status: DC | PRN

## 2015-11-27 MED ORDER — OLANZAPINE 10 MG PO TABS: 10.0000 mg | ORAL_TABLET | Freq: Every day | ORAL | Status: DC

## 2015-11-27 MED ORDER — FLUOXETINE HCL 40 MG PO CAPS: 40.0000 mg | ORAL_CAPSULE | ORAL | Status: DC

## 2015-11-27 NOTE — Progress Notes (Signed)
Patient ID: Jeffrey [name], male   DOB: 16-Aug-1961, 55 y.o.   MRN: [id number] Patient ID: Jeffrey [name], male   DOB: 16-Aug-1961, 55 y.o.   MRN: [id number] Patient ID: Jeffrey [name], male   DOB: 16-Aug-1961, 55 y.o.   MRN: [id number] Patient ID: Jeffrey [name], male   DOB: 16-Aug-1961, 55 y.o.   MRN: [id number]  Psychiatric Follow up Adult Assessment   Patient Identification: Jeffrey [name] MRN:  [id number] Date of Evaluation:  11/27/2015 Referral Source: "My insurance company" Chief Complaint:   Chief Complaint    Depression; Anxiety; Follow-up     Visit Diagnosis:    ICD-9-CM ICD-10-CM   1. Major depressive disorder, recurrent, severe without psychotic features (HCC) 296.33 F33.2    Diagnosis:   Patient Active Problem List   Diagnosis Date Noted  . [name] (peripheral vascular disease) (HCC) [I73.9] 06/18/2015  . Chronic diarrhea [K52.9] 05/02/2013  . Hematochezia [K92.1] 05/02/2013  . Abnormal weight loss [R63.4] 05/02/2013  . Abdominal pain, periumbilical [R10.33] 05/02/2013   History of Present Illness:  This patient is a 55 year old separated white male who is living with his daughter in [place]. He also has a 56 year old son. 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  The patient had been going to [facility] for the last couple of years but they no longer take his insurance and he was therefore referred here. His daughter states that he is not doing well in terms of mood and needs to be seen anyway.  The patient has a long history of depression that dates back to his early 5020s. At that time he was married to his first wife and for a while it went well. However when they separated she did not allow him to see his son and this went on for a period of about 8 years. He became increasingly stressed and depressed during this time. He was hospitalized several times for suicide attempts and was in the [place] [facility] twice in 2009. His daughter thinks his last  hospital they shows approximately 5 years ago.  The patient married again and he has been married to his second wife for 17 years. She has 3 adult children and one of her sons is very violent. He is finally decided to leave his wife because her son has fought with him several times and he feels like they're trying to take away his land. Over the last month he has been staying with his daughter but is very stressed about the whole situation. He's not able to eat and has lost 40 pounds over last year. His mood is very low and sad. His memory is poor. He smokes marijuana intermittently to try to deal with the stress. He has no interest in anything other than his children and grandchildren. His energy is nonexistent and he cannot get himself out of bed. He sees Dr. Andrey CampanileWilson for primary care and was there recently and apparently there was nothing medically wrong but we do not have these records. He has had a recent colonoscopy that was negative. He denies auditory or visual hallucinations and does not use alcohol or drugs other than marijuana  He has been on a combination of Depakote Paxil Navane and BuSpar for number of years. Nothing has been change in the last couple of years despite his continued downward slide. I don't have any record of any Depakote level  The patient returns after 2 months. He continues to live on his  own. He is here today acutely because he cannot sleep. He is eating better and has gained about 6 pounds. A number of factors are probably contributing to his insomnia. He stopped smoking marijuana about a week ago. He and his ex-wife had an argument and she said she would not speak to them again. He's very worried about his finances. He states that for the last few nights when he lies down his mind keeps racing and he can't drift to sleep. He also ran out of the Zyprexa several nights ago. I told him we could try reinstating the Zyprexa. He takes trazodone but is no longer working. I suggested  we try Restoril and he agrees Elements:  Location:  Global. Quality:  Severe. Severity:  Severe. Timing:  Last month. Duration:  Years. Context:  Conflict with wife and her children. Associated Signs/Symptoms: Depression Symptoms:  depressed mood, anhedonia, hypersomnia, psychomotor retardation, fatigue, feelings of worthlessness/guilt, difficulty concentrating, hopelessness, recurrent thoughts of death, anxiety, loss of energy/fatigue, weight loss, decreased appetite, (Hypo) Manic Symptoms:  Irritable Mood, Anxiety Symptoms:  Excessive Worry, Panic Symptoms,   Past Medical History:  Past Medical History  Diagnosis Date  . Hyperlipidemia   . Bipolar disorder (HCC)   . Peptic ulcer disease     Review  . Shortness of breath   . Pneumonia   . Depression     anxiety  . Anxiety   . Arthritis     deg disease, bulging disk,  shoulder level  . [name] (peripheral vascular disease) (HCC) 06/18/2015    Past Surgical History  Procedure Laterality Date  . Esophagogastroduodenoscopy  03/14/2007    [id number] antral gastritis with bulbar duodenitis/paucity to postbulbar duodenal folds and biopsy were benign with no evidence of villous atrophy.  . Colonoscopy  03/14/2007    [id number] colonoscopy and terminal ileoscopy except external hemorrhoids  . Hand surgery      left, secondary to self-inflicted laceration  . Toe surgery      left great toe , amputated-lawnmover accident  . Shoulder surgery      right  . Colonoscopy N/A 05/05/2013    Procedure: COLONOSCOPY;  Surgeon: [name], MD;  [facility];  Service: Endoscopy;  Laterality: N/A;  1:30   Family History:  Family History  Problem Relation Age of Onset  . Colon cancer Neg Hx   . Liver disease Neg Hx   . Breast cancer Mother     deceased  . Heart disease Father   . Depression Daughter   . Anxiety disorder Daughter   . Anxiety disorder Son   . Depression Son    Social History:   Social  History   Social History  . Marital Status: Married    Spouse Name: N/A  . Number of Children: 2  . Years of Education: N/A   Occupational History  . Disabled    Social History Main Topics  . Smoking status: Current Every Day Smoker -- 2.00 packs/day for 40 years    Types: Cigarettes  . Smokeless tobacco: Never Used  . Alcohol Use: No     Comment: last used 2 nights ago, smokes a joint about every 3 days  . Drug Use: Yes    Special: Marijuana     Comment: still doing some  . Sexual Activity: No   Other Topics Concern  . None   Social History Narrative   Additional Social History: He grew up in [place]. He has no history of trauma  or abuse as a child. 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 He has 2 grown children and 28 year old grandson. He's had significant conflicts with both wives and is currently very worried about the conflicts he is undergoing with his second wife and her children. The patient and his stepson of come to blows the police have been involved but no charges have been filed on either party. The patient has a long history of marijuana abuse but denies the use of any other drugs or alcohol  Musculoskeletal: Strength & Muscle Tone: within normal limits Gait & Station: normal Patient leans: N/A  Psychiatric Specialty Exam: Depression        Associated symptoms include suicidal ideas.  Past medical history includes anxiety.   Anxiety Symptoms include nervous/anxious behavior and suicidal ideas.      Review of Systems  Constitutional: Positive for weight loss and malaise/fatigue.  Gastrointestinal: Positive for abdominal pain, constipation and blood in stool.  Neurological: Positive for weakness.  Psychiatric/Behavioral: Positive for depression, suicidal ideas, memory loss and substance abuse. The patient is nervous/anxious.   All other systems reviewed and are negative.   Blood pressure 145/70, height 5\' 10"  (1.778 m), weight 145 lb (65.772  kg).Body mass index is 20.81 kg/(m^2).  General Appearance:  Fairly groomed  Eye Contact: good  Speech:  Clear   Volume:  Decreased  Mood: Dysphoric   Affect:  Depressed   Thought Process:   Fairly clear   Orientation:  Full (Time, Place, and Person)  Thought Content:  Obsessions and Rumination  Suicidal Thoughts: no  Homicidal Thoughts:  No  Memory:  Immediate;   Poor Recent;   Poor Remote;   Poor  Judgement:  Impaired  Insight:  Lacking  Psychomotor Activity: Normal   Concentration:  Poor  Recall:  Poor  Fund of Knowledge:Fair  Language: Fair  Akathisia:  No  Handed:  Right  AIMS (if indicated):    Assets:  Communication Skills Desire for Improvement Housing Resilience Social Support  ADL's:  Intact  Cognition: WNL  Sleep:  Fair    Is the patient at risk to self?  No. Has the patient been a risk to self in the past 6 months?  No. Has the patient been a risk to self within the distant past?  Yes.   Is the patient a risk to others?  No. Has the patient been a risk to others in the past 6 months?  No. Has the patient been a risk to others within the distant past?  No.  Allergies:   Allergies  Allergen Reactions  . Augmentin [Amoxicillin-Pot Clavulanate] Rash  . Ace Inhibitors    Current Medications: Current Outpatient Prescriptions  Medication Sig Dispense Refill  . atorvastatin (LIPITOR) 20 MG tablet Take 20 mg by mouth daily.     . divalproex (DEPAKOTE) 250 MG DR tablet Take three at bedtime 270 tablet 2  . fenofibrate micronized (LOFIBRA) 134 MG capsule Take 134 mg by mouth daily before breakfast.     . FLUoxetine (PROZAC) 40 MG capsule Take 1 capsule (40 mg total) by mouth every morning. 90 capsule 2  . naproxen (NAPROSYN) 500 MG tablet Take 1 po BID with food prn pain 30 tablet 0  . OLANZapine (ZYPREXA) 10 MG tablet Take 1 tablet (10 mg total) by mouth at bedtime. 30 tablet 2  . temazepam (RESTORIL) 30 MG capsule Take 1 capsule (30 mg total) by mouth at  bedtime as needed for  sleep. 30 capsule 1   No current facility-administered medications for this visit.    Previous Psychotropic Medications: Yes   Substance Abuse History in the last 12 months:  Yes.    Consequences of Substance Abuse: Medical Consequences:  Have contributed to his memory loss due to long-term use of marijuana  Medical Decision Making:  Review of Psycho-Social Stressors (1), Decision to obtain old records (1), Established Problem, Worsening (2), Review of Medication Regimen & Side Effects (2) and Review of New Medication or Change in Dosage (2)  Treatment Plan Summary: Medication management   the patient will continueProzac to help depression and  olanzapine to help mood stabilization and appetite. He'll continue  Depakote for mood stabilization . He will discontinue trazodone and start Restoril 30 mg at bedtime for sleep. He'll return in 4 weeks  ROSS, DEBORAH 1/4/201710:41 AM

## 2015-12-25 ENCOUNTER — Ambulatory Visit (INDEPENDENT_AMBULATORY_CARE_PROVIDER_SITE_OTHER): Payer: Medicare HMO | Admitting: Psychiatry

## 2015-12-25 ENCOUNTER — Encounter (HOSPITAL_COMMUNITY): Payer: Self-pay | Admitting: Psychiatry

## 2015-12-25 VITALS — BP 116/71 | HR 74 | Ht 70.0 in | Wt 148.4 lb

## 2015-12-25 DIAGNOSIS — F332 Major depressive disorder, recurrent severe without psychotic features: Secondary | ICD-10-CM | POA: Diagnosis not present

## 2015-12-25 MED ORDER — OLANZAPINE 10 MG PO TABS: 10.0000 mg | ORAL_TABLET | Freq: Every day | ORAL | Status: DC

## 2015-12-25 MED ORDER — FLUOXETINE HCL 40 MG PO CAPS: 40.0000 mg | ORAL_CAPSULE | ORAL | Status: DC

## 2015-12-25 MED ORDER — DIVALPROEX SODIUM 250 MG PO DR TAB: DELAYED_RELEASE_TABLET | ORAL | Status: DC

## 2015-12-25 MED ORDER — TEMAZEPAM 30 MG PO CAPS: 30.0000 mg | ORAL_CAPSULE | Freq: Every evening | ORAL | Status: DC | PRN

## 2015-12-25 NOTE — Progress Notes (Signed)
Patient ID: Jeffrey [name], male   DOB: 1961-10-30, 55 y.o.   MRN: [id number] Patient ID: Jeffrey [name], male   DOB: 07-Aug-1961, 55 y.o.   MRN: [id number] Patient ID: Jeffrey [name], male   DOB: 06-13-1961, 55 y.o.   MRN: [id number] Patient ID: Jeffrey [name], male   DOB: Sep 23, 1961, 55 y.o.   MRN: [id number] Patient ID: Jeffrey [name], male   DOB: 08/16/1961, 55 y.o.   MRN: [id number]  Psychiatric Follow up Adult Assessment   Patient Identification: Jeffrey [name] MRN:  [id number] Date of Evaluation:  12/25/2015 Referral Source: "My insurance company" Chief Complaint:   Chief Complaint    Depression; Anxiety; Follow-up     Visit Diagnosis:    ICD-9-CM ICD-10-CM   1. Major depressive disorder, recurrent, severe without psychotic features (HCC) 296.33 F33.2    Diagnosis:   Patient Active Problem List   Diagnosis Date Noted  . [name] (peripheral vascular disease) (HCC) [I73.9] 06/18/2015  . Chronic diarrhea [K52.9] 05/02/2013  . Hematochezia [K92.1] 05/02/2013  . Abnormal weight loss [R63.4] 05/02/2013  . Abdominal pain, periumbilical [R10.33] 05/02/2013   History of Present Illness:  This patient is a 55 year old separated white male who is living with his daughter in [place]. He also has a 13 year old son. 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  The patient had been going to [facility] for the last couple of years but they no longer take his insurance and he was therefore referred here. His daughter states that he is not doing well in terms of mood and needs to be seen anyway.  The patient has a long history of depression that dates back to his early 55s. At that time he was married to his first wife and for a while it went well. However when they separated she did not allow him to see his son and this went on for a period of about 8 years. He became increasingly stressed and depressed during this time. He was hospitalized several times for suicide attempts and was  in the [place] hospital twice in 2009. His daughter thinks his last hospital they shows approximately 5 years ago.  The patient married again and he has been married to his second wife for 17 years. She has 3 adult children and one of her sons is very violent. He is finally decided to leave his wife because her son has fought with him several times and he feels like they're trying to take away his land. Over the last month he has been staying with his daughter but is very stressed about the whole situation. He's not able to eat and has lost 40 pounds over last year. His mood is very low and sad. His memory is poor. He smokes marijuana intermittently to try to deal with the stress. He has no interest in anything other than his children and grandchildren. His energy is nonexistent and he cannot get himself out of bed. He sees [name] for primary care and was there recently and apparently there was nothing medically wrong but we do not have these records. He has had a recent colonoscopy that was negative. He denies auditory or visual hallucinations and does not use alcohol or drugs other than marijuana  He has been on a combination of Depakote Paxil Navane and BuSpar for number of years. Nothing has been change in the last couple of years despite his continued downward slide. I don't have any record of  any Depakote level  The patient returns after 4 weeks. Last time he was not able to sleep and I added Restoril to his regimen. He is now sleeping much better. He has gained another 3 pounds and seems to be eating a little bit more. He still somewhat down about his marriage falling apart but he seems to be in brighter spirits and he used to be. He is smiling more today and his mood seems to be better. He denies any use of drugs or alcohol Elements:  Location:  Global. Quality:  Severe. Severity:  Severe. Timing:  Last month. Duration:  Years. Context:  Conflict with wife and her  children. Associated Signs/Symptoms: Depression Symptoms:  depressed mood, anhedonia, hypersomnia, psychomotor retardation, fatigue, feelings of worthlessness/guilt, difficulty concentrating, hopelessness, recurrent thoughts of death, anxiety, loss of energy/fatigue, weight loss, decreased appetite, (Hypo) Manic Symptoms:  Irritable Mood, Anxiety Symptoms:  Excessive Worry, Panic Symptoms,   Past Medical History:  Past Medical History  Diagnosis Date  . Hyperlipidemia   . Bipolar disorder (HCC)   . Peptic ulcer disease     Review  . Shortness of breath   . Pneumonia   . Depression     anxiety  . Anxiety   . Arthritis     deg disease, bulging disk,  shoulder level  . [name] (peripheral vascular disease) (HCC) 06/18/2015    Past Surgical History  Procedure Laterality Date  . Esophagogastroduodenoscopy  03/14/2007    [id number] antral gastritis with bulbar duodenitis/paucity to postbulbar duodenal folds and biopsy were benign with no evidence of villous atrophy.  . Colonoscopy  03/14/2007    [id number] colonoscopy and terminal ileoscopy except external hemorrhoids  . Hand surgery      left, secondary to self-inflicted laceration  . Toe surgery      left great toe , amputated-lawnmover accident  . Shoulder surgery      right  . Colonoscopy N/A 05/05/2013    Procedure: COLONOSCOPY;  Surgeon: [name], MD;  [facility];  Service: Endoscopy;  Laterality: N/A;  1:30   Family History:  Family History  Problem Relation Age of Onset  . Colon cancer Neg Hx   . Liver disease Neg Hx   . Breast cancer Mother     deceased  . Heart disease Father   . Depression Daughter   . Anxiety disorder Daughter   . Anxiety disorder Son   . Depression Son    Social History:   Social History   Social History  . Marital Status: Married    Spouse Name: N/A  . Number of Children: 2  . Years of Education: N/A   Occupational History  . Disabled    Social  History Main Topics  . Smoking status: Current Every Day Smoker -- 2.00 packs/day for 40 years    Types: Cigarettes  . Smokeless tobacco: Never Used  . Alcohol Use: No     Comment: last used 2 nights ago, smokes a joint about every 3 days  . Drug Use: Yes    Special: Marijuana     Comment: still doing some  . Sexual Activity: No   Other Topics Concern  . None   Social History Narrative   Additional Social History: He grew up in [place]. He has no history of trauma or abuse as a child. 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 He has 2 grown children and 45 year old grandson. He's had significant conflicts with  both wives and is currently very worried about the conflicts he is undergoing with his second wife and her children. The patient and his stepson of come to blows the police have been involved but no charges have been filed on either party. The patient has a long history of marijuana abuse but denies the use of any other drugs or alcohol  Musculoskeletal: Strength & Muscle Tone: within normal limits Gait & Station: normal Patient leans: N/A  Psychiatric Specialty Exam: Depression        Associated symptoms include suicidal ideas.  Past medical history includes anxiety.   Anxiety Symptoms include nervous/anxious behavior and suicidal ideas.      Review of Systems  Constitutional: Positive for weight loss and malaise/fatigue.  Gastrointestinal: Positive for abdominal pain, constipation and blood in stool.  Neurological: Positive for weakness.  Psychiatric/Behavioral: Positive for depression, suicidal ideas, memory loss and substance abuse. The patient is nervous/anxious.   All other systems reviewed and are negative.   Blood pressure 116/71, pulse 74, height 5\' 10"  (1.778 m), weight 148 lb 6.4 oz (67.314 kg), SpO2 93 %.Body mass index is 21.29 kg/(m^2).  General Appearance:  Fairly groomed  Eye Contact: good  Speech:  Clear   Volume:  Decreased  Mood: Fairly  good   Affect:  Chief Executive Officer Process:   Fairly clear   Orientation:  Full (Time, Place, and Person)  Thought Content:  Obsessions and Rumination  Suicidal Thoughts: no  Homicidal Thoughts:  No  Memory:  Immediate;   Poor Recent;   Poor Remote;   Poor  Judgement:  Impaired  Insight:  Lacking  Psychomotor Activity: Normal   Concentration:  Poor  Recall:  Poor  Fund of Knowledge:Fair  Language: Fair  Akathisia:  No  Handed:  Right  AIMS (if indicated):    Assets:  Communication Skills Desire for Improvement Housing Resilience Social Support  ADL's:  Intact  Cognition: WNL  Sleep:  Fair    Is the patient at risk to self?  No. Has the patient been a risk to self in the past 6 months?  No. Has the patient been a risk to self within the distant past?  Yes.   Is the patient a risk to others?  No. Has the patient been a risk to others in the past 6 months?  No. Has the patient been a risk to others within the distant past?  No.  Allergies:   Allergies  Allergen Reactions  . Augmentin [Amoxicillin-Pot Clavulanate] Rash  . Ace Inhibitors    Current Medications: Current Outpatient Prescriptions  Medication Sig Dispense Refill  . atorvastatin (LIPITOR) 20 MG tablet Take 20 mg by mouth daily.     . divalproex (DEPAKOTE) 250 MG DR tablet Take three at bedtime 270 tablet 2  . fenofibrate micronized (LOFIBRA) 134 MG capsule Take 134 mg by mouth daily before breakfast.     . FLUoxetine (PROZAC) 40 MG capsule Take 1 capsule (40 mg total) by mouth every morning. 90 capsule 2  . naproxen (NAPROSYN) 500 MG tablet Take 1 po BID with food prn pain 30 tablet 0  . OLANZapine (ZYPREXA) 10 MG tablet Take 1 tablet (10 mg total) by mouth at bedtime. 90 tablet 2  . temazepam (RESTORIL) 30 MG capsule Take 1 capsule (30 mg total) by mouth at bedtime as needed for sleep. 30 capsule 2   No current facility-administered medications for this visit.    Previous Psychotropic Medications: Yes  Substance Abuse History in the last 12 months:  Yes.    Consequences of Substance Abuse: Medical Consequences:  Have contributed to his memory loss due to long-term use of marijuana  Medical Decision Making:  Review of Psycho-Social Stressors (1), Decision to obtain old records (1), Established Problem, Worsening (2), Review of Medication Regimen & Side Effects (2) and Review of New Medication or Change in Dosage (2)  Treatment Plan Summary: Medication management   the patient will continueProzac to help depression and  olanzapine to help mood stabilization and appetite. He'll continue  Depakote for mood stabilization . He will continue Restoril 30 mg at bedtime for sleep. He'll return in 3 months  ROSS, [facility] 2/1/201710:12 AM

## 2016-03-23 ENCOUNTER — Ambulatory Visit (HOSPITAL_COMMUNITY): Payer: Medicare HMO | Admitting: Psychiatry

## 2016-03-23 ENCOUNTER — Encounter (HOSPITAL_COMMUNITY): Payer: Self-pay | Admitting: Psychiatry

## 2016-04-08 ENCOUNTER — Telehealth (HOSPITAL_COMMUNITY): Payer: Self-pay | Admitting: *Deleted

## 2016-04-08 ENCOUNTER — Encounter (HOSPITAL_COMMUNITY): Payer: Self-pay | Admitting: Psychiatry

## 2016-04-08 ENCOUNTER — Ambulatory Visit (INDEPENDENT_AMBULATORY_CARE_PROVIDER_SITE_OTHER): Payer: Medicare HMO | Admitting: Psychiatry

## 2016-04-08 VITALS — BP 103/53 | HR 61 | Ht 70.0 in | Wt 151.0 lb

## 2016-04-08 DIAGNOSIS — F332 Major depressive disorder, recurrent severe without psychotic features: Secondary | ICD-10-CM | POA: Diagnosis not present

## 2016-04-08 MED ORDER — DIVALPROEX SODIUM 250 MG PO DR TAB: DELAYED_RELEASE_TABLET | ORAL | Status: DC

## 2016-04-08 MED ORDER — OLANZAPINE 10 MG PO TABS: 10.0000 mg | ORAL_TABLET | Freq: Every day | ORAL | Status: DC

## 2016-04-08 MED ORDER — TEMAZEPAM 30 MG PO CAPS: 30.0000 mg | ORAL_CAPSULE | Freq: Every evening | ORAL | Status: DC | PRN

## 2016-04-08 MED ORDER — FLUOXETINE HCL 40 MG PO CAPS: 40.0000 mg | ORAL_CAPSULE | ORAL | Status: DC

## 2016-04-08 NOTE — Progress Notes (Signed)
Patient ID: Jeffrey [name], male   DOB: 1961-01-13, 55 y.o.   MRN: [id number] Patient ID: Jeffrey [name], male   DOB: [birth year], 55 y.o.   MRN: [id number] Patient ID: Jeffrey [name], male   DOB: Oct 06, 1961, 55 y.o.   MRN: [id number] Patient ID: Jeffrey [name], male   DOB: 10-15-1961, 55 y.o.   MRN: [id number] Patient ID: Jeffrey [name], male   DOB: 1961/03/05, 55 y.o.   MRN: [id number] Patient ID: Jeffrey [name], male   DOB: 04/12/1961, 55 y.o.   MRN: [id number]  Psychiatric Follow up Adult Assessment   Patient Identification: Jeffrey [name] MRN:  [id number] Date of Evaluation:  04/08/2016 Referral Source: "My insurance company" Chief Complaint:   Chief Complaint    Depression; Anxiety; Follow-up     Visit Diagnosis:    ICD-9-CM ICD-10-CM   1. Major depressive disorder, recurrent, severe without psychotic features (HCC) 296.33 F33.2    Diagnosis:   Patient Active Problem List   Diagnosis Date Noted  . [name] (peripheral vascular disease) (HCC) [I73.9] 06/18/2015  . Chronic diarrhea [K52.9] 05/02/2013  . Hematochezia [K92.1] 05/02/2013  . Abnormal weight loss [R63.4] 05/02/2013  . Abdominal pain, periumbilical [R10.33] 05/02/2013   History of Present Illness:  This patient is a 55 year old separated white male who is living with his daughter in [place]. He also has a 71 year old son. 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  The patient had been going to [facility] for the last couple of years but they no longer take his insurance and he was therefore referred here. His daughter states that he is not doing well in terms of mood and needs to be seen anyway.  The patient has a long history of depression that dates back to his early 50s. At that time he was married to his first wife and for a while it went well. However when they separated she did not allow him to see his son and this went on for a period of about 8 years. He became increasingly stressed and depressed  during this time. He was hospitalized several times for suicide attempts and was in the [facility] twice in 2009. His daughter thinks his last hospital they shows approximately 5 years ago.  The patient married again and he has been married to his second wife for 17 years. She has 3 adult children and one of her sons is very violent. He is finally decided to leave his wife because her son has fought with him several times and he feels like they're trying to take away his land. Over the last month he has been staying with his daughter but is very stressed about the whole situation. He's not able to eat and has lost 40 pounds over last year. His mood is very low and sad. His memory is poor. He smokes marijuana intermittently to try to deal with the stress. He has no interest in anything other than his children and grandchildren. His energy is nonexistent and he cannot get himself out of bed. He sees [name] for primary care and was there recently and apparently there was nothing medically wrong but we do not have these records. He has had a recent colonoscopy that was negative. He denies auditory or visual hallucinations and does not use alcohol or drugs other than marijuana  He has been on a combination of Depakote Paxil Navane and BuSpar for number of years. Nothing has been change in  the last couple of years despite his continued downward slide. I don't have any record of any Depakote level  The patient returns after 3 months. For the most part he is doing better. He is sleeping well on the Restoril. However he only sleeps about 6 hours a night. He admits that he drinks about 6 [employer] cans a day and I urged him to cut this down because of the caffeine. His mood is better and he seems calmer. His primary doctor put him on metoprolol which is helped with his blood pressure and his anxiety. He is gradually gaining back some of the weight and he is eating more. He still stressed about going  to his current divorce and trying to divide up all the property. Elements:  Location:  Global. Quality:  Severe. Severity:  Severe. Timing:  Last month. Duration:  Years. Context:  Conflict with wife and her children. Associated Signs/Symptoms: Depression Symptoms:  depressed mood, anhedonia, hypersomnia, psychomotor retardation, fatigue, feelings of worthlessness/guilt, difficulty concentrating, hopelessness, recurrent thoughts of death, anxiety, loss of energy/fatigue, weight loss, decreased appetite, (Hypo) Manic Symptoms:  Irritable Mood, Anxiety Symptoms:  Excessive Worry, Panic Symptoms,   Past Medical History:  Past Medical History  Diagnosis Date  . Hyperlipidemia   . Bipolar disorder (HCC)   . Peptic ulcer disease     Review  . Shortness of breath   . Pneumonia   . Depression     anxiety  . Anxiety   . Arthritis     deg disease, bulging disk,  shoulder level  . [name] (peripheral vascular disease) (HCC) 06/18/2015    Past Surgical History  Procedure Laterality Date  . Esophagogastroduodenoscopy  03/14/2007    [id number] antral gastritis with bulbar duodenitis/paucity to postbulbar duodenal folds and biopsy were benign with no evidence of villous atrophy.  . Colonoscopy  03/14/2007    JXB:JYNWGN colonoscopy and terminal ileoscopy except external hemorrhoids  . Hand surgery      left, secondary to self-inflicted laceration  . Toe surgery      left great toe , amputated-lawnmover accident  . Shoulder surgery      right  . Colonoscopy N/A 05/05/2013    Procedure: COLONOSCOPY;  Surgeon: [name], MD;  [facility];  Service: Endoscopy;  Laterality: N/A;  1:30   Family History:  Family History  Problem Relation Age of Onset  . Colon cancer Neg Hx   . Liver disease Neg Hx   . Breast cancer Mother     deceased  . Heart disease Father   . Depression Daughter   . Anxiety disorder Daughter   . Anxiety disorder Son   . Depression Son     Social History:   Social History   Social History  . Marital Status: Married    Spouse Name: N/A  . Number of Children: 2  . Years of Education: N/A   Occupational History  . Disabled    Social History Main Topics  . Smoking status: Current Every Day Smoker -- 2.00 packs/day for 40 years    Types: Cigarettes  . Smokeless tobacco: Never Used  . Alcohol Use: No     Comment: last used 2 nights ago, smokes a joint about every 3 days  . Drug Use: Yes    Special: Marijuana     Comment: still doing some  . Sexual Activity: No   Other Topics Concern  . None   Social History Narrative   Additional Social  History: He grew up in [place]. He has no history of trauma or abuse as a child. 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 He has 2 grown children and 89 year old grandson. He's had significant conflicts with both wives and is currently very worried about the conflicts he is undergoing with his second wife and her children. The patient and his stepson of come to blows the police have been involved but no charges have been filed on either party. The patient has a long history of marijuana abuse but denies the use of any other drugs or alcohol  Musculoskeletal: Strength & Muscle Tone: within normal limits Gait & Station: normal Patient leans: N/A  Psychiatric Specialty Exam: Depression        Associated symptoms include suicidal ideas.  Past medical history includes anxiety.   Anxiety Symptoms include nervous/anxious behavior and suicidal ideas.      Review of Systems  Constitutional: Positive for weight loss and malaise/fatigue.  Gastrointestinal: Positive for abdominal pain, constipation and blood in stool.  Neurological: Positive for weakness.  Psychiatric/Behavioral: Positive for depression, suicidal ideas, memory loss and substance abuse. The patient is nervous/anxious.   All other systems reviewed and are negative.   Blood pressure 103/53, pulse 61, height   (1.778 m), weight 151 lb (68.493 kg), SpO2 94 %.Body mass index is 21.67 kg/(m^2).  General Appearance:  Fairly groomed  Eye Contact: good  Speech:  Clear   Volume:  Decreased  Mood: Fairly good   Affect:  Brighter   Thought Process:  clear   Orientation:  Full (Time, Place, and Person)  Thought Content:  Obsessions and Rumination  Suicidal Thoughts: no  Homicidal Thoughts:  No  Memory:  Immediate;   Poor Recent;   Poor Remote;   Poor  Judgement:  Impaired  Insight:  Lacking  Psychomotor Activity: Normal   Concentration:  Poor  Recall:  Poor  Fund of Knowledge:Fair  Language: Fair  Akathisia:  No  Handed:  Right  AIMS (if indicated):    Assets:  Communication Skills Desire for Improvement Housing Resilience Social Support  ADL's:  Intact  Cognition: WNL  Sleep:  Fair    Is the patient at risk to self?  No. Has the patient been a risk to self in the past 6 months?  No. Has the patient been a risk to self within the distant past?  Yes.   Is the patient a risk to others?  No. Has the patient been a risk to others in the past 6 months?  No. Has the patient been a risk to others within the distant past?  No.  Allergies:   Allergies  Allergen Reactions  . Augmentin [Amoxicillin-Pot Clavulanate] Rash  . Ace Inhibitors    Current Medications: Current Outpatient Prescriptions  Medication Sig Dispense Refill  . atorvastatin (LIPITOR) 20 MG tablet Take 20 mg by mouth daily.     . budesonide-formoterol (SYMBICORT) 80-4.5 MCG/ACT inhaler     . divalproex (DEPAKOTE) 250 MG DR tablet Take two at bedtime and 1 QAM 270 tablet 2  . FLUoxetine (PROZAC) 40 MG capsule Take 1 capsule (40 mg total) by mouth every morning. 90 capsule 2  . OLANZapine (ZYPREXA) 10 MG tablet Take 1 tablet (10 mg total) by mouth at bedtime. 90 tablet 2  . temazepam (RESTORIL) 30 MG capsule Take 1 capsule (30 mg total) by mouth at bedtime as needed for sleep. 30 capsule 2  . fenofibrate micronized  (  LOFIBRA) 134 MG capsule     . metoprolol (LOPRESSOR) 50 MG tablet      No current facility-administered medications for this visit.    Previous Psychotropic Medications: Yes   Substance Abuse History in the last 12 months:  Yes.    Consequences of Substance Abuse: Medical Consequences:  Have contributed to his memory loss due to long-term use of marijuana  Medical Decision Making:  Review of Psycho-Social Stressors (1), Decision to obtain old records (1), Established Problem, Worsening (2), Review of Medication Regimen & Side Effects (2) and Review of New Medication or Change in Dosage (2)  Treatment Plan Summary: Medication management   the patient will continueProzac to help depression and  olanzapine to help mood stabilization and appetite. He'll continue  Depakote for mood stabilization . He will continue Restoril 30 mg at bedtime for sleep. He'll return in 3 months  AltavistaROSS, City Pl Surgery CenterDEBORAH 5/17/20173:39 PM

## 2016-06-01 ENCOUNTER — Other Ambulatory Visit (HOSPITAL_COMMUNITY): Payer: Self-pay | Admitting: Psychiatry

## 2016-07-08 ENCOUNTER — Encounter (HOSPITAL_COMMUNITY): Payer: Self-pay | Admitting: Psychiatry

## 2016-07-08 ENCOUNTER — Ambulatory Visit (INDEPENDENT_AMBULATORY_CARE_PROVIDER_SITE_OTHER): Payer: Medicare HMO | Admitting: Psychiatry

## 2016-07-08 VITALS — BP 124/65 | HR 58 | Ht 70.0 in | Wt 150.6 lb

## 2016-07-08 DIAGNOSIS — F332 Major depressive disorder, recurrent severe without psychotic features: Secondary | ICD-10-CM | POA: Diagnosis not present

## 2016-07-08 MED ORDER — TEMAZEPAM 30 MG PO CAPS: 30.0000 mg | ORAL_CAPSULE | Freq: Every evening | ORAL | 2 refills | Status: DC | PRN

## 2016-07-08 MED ORDER — DIVALPROEX SODIUM 250 MG PO DR TAB: 750.0000 mg | DELAYED_RELEASE_TABLET | Freq: Every day | ORAL | 2 refills | Status: DC

## 2016-07-08 MED ORDER — FLUOXETINE HCL 40 MG PO CAPS: 40.0000 mg | ORAL_CAPSULE | ORAL | 2 refills | Status: DC

## 2016-07-08 MED ORDER — OLANZAPINE 5 MG PO TABS: 5.0000 mg | ORAL_TABLET | Freq: Every day | ORAL | 2 refills | Status: DC

## 2016-07-08 NOTE — Patient Instructions (Signed)
Zyprexa has been cut to 5 mg at bedtime

## 2016-07-08 NOTE — Progress Notes (Signed)
Patient ID: IRA DOUGHER, male   DOB: 01/01/1961, 55 y.o.   MRN: [id number] Patient ID: JERMAR COLTER, male   DOB: 1961/06/12, 55 y.o.   MRN: [id number] Patient ID: NIHAAL FRIESEN, male   DOB: 1961/07/15, 55 y.o.   MRN: [id number] Patient ID: HARTLEY URTON, male   DOB: 02/09/1961, 55 y.o.   MRN: [id number] Patient ID: SYRIS BROOKENS, male   DOB: August 22, 1961, 55 y.o.   MRN: [id number] Patient ID: THORNTON DOHRMANN, male   DOB: [birth year], 55 y.o.   MRN: [id number]  Psychiatric Follow up Adult Assessment   Patient Identification: DARAY POLGAR MRN:  [id number] Date of Evaluation:  07/08/2016 Referral Source: "My insurance company" Chief Complaint:   Chief Complaint    Anxiety; Depression; Follow-up     Visit Diagnosis:    ICD-9-CM ICD-10-CM   1. Major depressive disorder, recurrent, severe without psychotic features (HCC) 296.33 F33.2    Diagnosis:   Patient Active Problem List   Diagnosis Date Noted  . [name] (peripheral vascular disease) (HCC) [I73.9] 06/18/2015  . Chronic diarrhea [K52.9] 05/02/2013  . Hematochezia [K92.1] 05/02/2013  . Abnormal weight loss [R63.4] 05/02/2013  . Abdominal pain, periumbilical [R10.33] 05/02/2013   History of Present Illness:  This patient is a 55 year old separated white male who is living with his daughter in [place]. He also has a 66 year old son. 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  The patient had been going to [facility] for the last couple of years but they no longer take his insurance and he was therefore referred here. His daughter states that he is not doing well in terms of mood and needs to be seen anyway.  The patient has a long history of depression that dates back to his early 96s. At that time he was married to his first wife and for a while it went well. However when they separated she did not allow him to see his son and this went on for a period of about 8 years. He became increasingly stressed and depressed  during this time. He was hospitalized several times for suicide attempts and was in the [facility] twice in 2009. His daughter thinks his last hospital they shows approximately 5 years ago.  The patient married again and he has been married to his second wife for 17 years. She has 3 adult children and one of her sons is very violent. He is finally decided to leave his wife because her son has fought with him several times and he feels like they're trying to take away his land. Over the last month he has been staying with his daughter but is very stressed about the whole situation. He's not able to eat and has lost 40 pounds over last year. His mood is very low and sad. His memory is poor. He smokes marijuana intermittently to try to deal with the stress. He has no interest in anything other than his children and grandchildren. His energy is nonexistent and he cannot get himself out of bed. He sees [name] for primary care and was there recently and apparently there was nothing medically wrong but we do not have these records. He has had a recent colonoscopy that was negative. He denies auditory or visual hallucinations and does not use alcohol or drugs other than marijuana  He has been on a combination of Depakote Paxil Navane and BuSpar for number of years. Nothing has been change in  the last couple of years despite his continued downward slide. I don't have any record of any Depakote level  The patient returns after 3 months. For the most part he is doing better. He is sleeping well on the Restoril. He and his wife are going to have to divide up the property but he has come to terms with this. 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 He recently saw his family physician at [facility] and they put him on albuterol inhaler because he has shortness of breath. He feels fatigued all the time. I suggested that now that he is feeling better we can cut down the Zyprexa to 5 mg at bedtime to minimize  his daytime sluggishness. He doesn't want to get off Restoril because it's really helped him sleep. He is also been referred to cardiology. Elements:  Location:  Global. Quality:  Severe. Severity:  Severe. Timing:  Last month. Duration:  Years. Context:  Conflict with wife and her children. Associated Signs/Symptoms: Depression Symptoms:  depressed mood, anhedonia, hypersomnia, psychomotor retardation, fatigue, feelings of worthlessness/guilt, difficulty concentrating, hopelessness, recurrent thoughts of death, anxiety, loss of energy/fatigue, weight loss, decreased appetite, (Hypo) Manic Symptoms:  Irritable Mood, Anxiety Symptoms:  Excessive Worry, Panic Symptoms,   Past Medical History:  Past Medical History:  Diagnosis Date  . Anxiety   . Arthritis    deg disease, bulging disk,  shoulder level  . Bipolar disorder (HCC)   . Depression    anxiety  . Hyperlipidemia   . Peptic ulcer disease    Review  . Pneumonia   . [name] (peripheral vascular disease) (HCC) 06/18/2015  . Shortness of breath     Past Surgical History:  Procedure Laterality Date  . COLONOSCOPY  03/14/2007   ZOX:WRUEAV colonoscopy and terminal ileoscopy except external hemorrhoids  . COLONOSCOPY N/A 05/05/2013   Procedure: COLONOSCOPY;  Surgeon: [name], MD;  [facility];  Service: Endoscopy;  Laterality: N/A;  1:30  . ESOPHAGOGASTRODUODENOSCOPY  03/14/2007   [id number] antral gastritis with bulbar duodenitis/paucity to postbulbar duodenal folds and biopsy were benign with no evidence of villous atrophy.  Marland Kitchen HAND SURGERY     left, secondary to self-inflicted laceration  . SHOULDER SURGERY     right  . TOE SURGERY     left great toe , amputated-lawnmover accident   Family History:  Family History  Problem Relation Age of Onset  . Colon cancer Neg Hx   . Liver disease Neg Hx   . Breast cancer Mother     deceased  . Heart disease Father   . Depression Daughter   .  Anxiety disorder Daughter   . Anxiety disorder Son   . Depression Son    Social History:   Social History   Social History  . Marital status: Married    Spouse name: N/A  . Number of children: 2  . Years of education: N/A   Occupational History  . Disabled    Social History Main Topics  . Smoking status: Current Every Day Smoker    Packs/day: 2.00    Years: 40.00    Types: Cigarettes  . Smokeless tobacco: Never Used  . Alcohol use No     Comment: last used 2 nights ago, smokes a joint about every 3 days  . Drug use:     Types: Marijuana     Comment: still doing some  . Sexual activity: No   Other Topics Concern  . None   Social  History Narrative  . None   Additional Social History: [place] He has no history of trauma or abuse as a child. 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 He has 2 grown children and 56 year old grandson. He's had significant conflicts with both wives and is currently very worried about the conflicts he is undergoing with his second wife and her children. The patient and his stepson of come to blows the police have been involved but no charges have been filed on either party. The patient has a long history of marijuana abuse but denies the use of any other drugs or alcohol  Musculoskeletal: Strength & Muscle Tone: within normal limits Gait & Station: normal Patient leans: N/A  Psychiatric Specialty Exam: Depression         Associated symptoms include suicidal ideas.  Past medical history includes anxiety.   Anxiety  Symptoms include nervous/anxious behavior and suicidal ideas.      Review of Systems  Constitutional: Positive for malaise/fatigue and weight loss.  Gastrointestinal: Positive for abdominal pain, blood in stool and constipation.  Neurological: Positive for weakness.  Psychiatric/Behavioral: Positive for depression, memory loss, substance abuse and suicidal ideas. The patient is nervous/anxious.   All  other systems reviewed and are negative.   Blood pressure 124/65, pulse (!) 58, height 5\' 10"  (1.778 m), weight 150 lb 9.6 oz (68.3 kg), SpO2 93 %.Body mass index is 21.61 kg/m.  General Appearance:  Fairly groomed  Eye Contact: good  Speech:  Clear   Volume:  Decreased  Mood: Fairly good   Affect:  Bright  Thought Process:  clear   Orientation:  Full (Time, Place, and Person)  Thought Content:  Obsessions and Rumination  Suicidal Thoughts: no  Homicidal Thoughts:  No  Memory:  Immediate;   Poor Recent;   Poor Remote;   Poor  Judgement:  Impaired  Insight:  Lacking  Psychomotor Activity: Normal   Concentration:  Poor  Recall:  Poor  Fund of Knowledge:Fair  Language: Fair  Akathisia:  No  Handed:  Right  AIMS (if indicated):    Assets:  Communication Skills Desire for Improvement Housing Resilience Social Support  ADL's:  Intact  Cognition: WNL  Sleep:  Fair    Is the patient at risk to self?  No. Has the patient been a risk to self in the past 6 months?  No. Has the patient been a risk to self within the distant past?  Yes.   Is the patient a risk to others?  No. Has the patient been a risk to others in the past 6 months?  No. Has the patient been a risk to others within the distant past?  No.  Allergies:   Allergies  Allergen Reactions  . Augmentin [Amoxicillin-Pot Clavulanate] Rash  . Ace Inhibitors    Current Medications: Current Outpatient Prescriptions  Medication Sig Dispense Refill  . albuterol (PROVENTIL HFA;VENTOLIN HFA) 108 (90 Base) MCG/ACT inhaler Inhale 2 puffs into the lungs every 6 (six) hours as needed for wheezing or shortness of breath.    Marland Kitchen. atorvastatin (LIPITOR) 20 MG tablet Take 20 mg by mouth daily.     . budesonide-formoterol (SYMBICORT) 80-4.5 MCG/ACT inhaler Inhale 1 puff into the lungs.     . divalproex (DEPAKOTE) 250 MG DR tablet Take 3 tablets (750 mg total) by mouth at bedtime. 270 tablet 2  . fenofibrate micronized (LOFIBRA) 134  MG capsule     . FLUoxetine (PROZAC)  40 MG capsule Take 1 capsule (40 mg total) by mouth every morning. 90 capsule 2  . metoprolol (LOPRESSOR) 50 MG tablet     . temazepam (RESTORIL) 30 MG capsule Take 1 capsule (30 mg total) by mouth at bedtime as needed for sleep. 30 capsule 2  . OLANZapine (ZYPREXA) 5 MG tablet Take 1 tablet (5 mg total) by mouth at bedtime. 90 tablet 2   No current facility-administered medications for this visit.     Previous Psychotropic Medications: Yes   Substance Abuse History in the last 12 months:  Yes.    Consequences of Substance Abuse: Medical Consequences:  Have contributed to his memory loss due to long-term use of marijuana  Medical Decision Making:  Review of Psycho-Social Stressors (1), Decision to obtain old records (1), Established Problem, Worsening (2), Review of Medication Regimen & Side Effects (2) and Review of New Medication or Change in Dosage (2)  Treatment Plan Summary: Medication management   the patient will continueProzac to help depression and  olanzapine to help mood stabilization and appetite. His olanzapine will be decreased from 10-5 mg at bedtime He'll continue  Depakote for mood stabilization . He will continue Restoril 30 mg at bedtime for sleep. He'll return in 3 months  [place]boro[place]OSS, [facility] HospitalDEBORAH 8/16/20173:21 PM Patient ID: Marni Griffondward T Phillippi, male   DOB: 1961-06-14, 55 y.o.   MRN: [id number]

## 2016-07-13 ENCOUNTER — Other Ambulatory Visit (HOSPITAL_COMMUNITY): Payer: Self-pay | Admitting: Psychiatry

## 2016-07-30 ENCOUNTER — Encounter: Payer: Self-pay | Admitting: *Deleted

## 2016-08-03 ENCOUNTER — Ambulatory Visit (INDEPENDENT_AMBULATORY_CARE_PROVIDER_SITE_OTHER): Payer: Medicare HMO | Admitting: Cardiovascular Disease

## 2016-08-03 ENCOUNTER — Encounter: Payer: Self-pay | Admitting: Cardiovascular Disease

## 2016-08-03 VITALS — BP 130/74 | HR 56 | Ht 70.0 in | Wt 148.0 lb

## 2016-08-03 DIAGNOSIS — R001 Bradycardia, unspecified: Secondary | ICD-10-CM

## 2016-08-03 DIAGNOSIS — Z720 Tobacco use: Secondary | ICD-10-CM | POA: Diagnosis not present

## 2016-08-03 DIAGNOSIS — R0600 Dyspnea, unspecified: Secondary | ICD-10-CM

## 2016-08-03 DIAGNOSIS — I739 Peripheral vascular disease, unspecified: Secondary | ICD-10-CM

## 2016-08-03 DIAGNOSIS — R5383 Other fatigue: Secondary | ICD-10-CM

## 2016-08-03 DIAGNOSIS — R0609 Other forms of dyspnea: Secondary | ICD-10-CM

## 2016-08-03 MED ORDER — METOPROLOL TARTRATE 25 MG PO TABS: 25.0000 mg | ORAL_TABLET | Freq: Two times a day (BID) | ORAL | 3 refills | Status: DC
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
## 2016-08-03 NOTE — Addendum Note (Signed)
Addended by: Eustace MooreANDERSON, LYDIA M on: 08/03/2016 05:08 PM   Modules accepted: Orders

## 2016-08-03 NOTE — Patient Instructions (Addendum)
Your physician recommends that you schedule a follow-up appointment in: 6 weeks  DECREASE Lopressor to 25 mg twice a day  Your physician has requested that you have a lexiscan myoview. For further information please visit [email or web address] Please follow instruction sheet, as given.     If you need a refill on your cardiac medications before your next appointment, please call your pharmacy.      Thank you for choosing [facility] !

## 2016-08-10 ENCOUNTER — Encounter (HOSPITAL_COMMUNITY)
Admission: RE | Admit: 2016-08-10 | Discharge: 2016-08-10 | Disposition: A | Discharge status: Home / Self Care | Payer: Medicare HMO | Source: Ambulatory Visit | Attending: Cardiovascular Disease | Admitting: Cardiovascular Disease

## 2016-08-10 ENCOUNTER — Encounter (HOSPITAL_COMMUNITY): Payer: Self-pay

## 2016-08-10 ENCOUNTER — Inpatient Hospital Stay (HOSPITAL_COMMUNITY): Admission: RE | Admit: 2016-08-10 | Payer: Self-pay | Source: Ambulatory Visit

## 2016-08-10 DIAGNOSIS — I739 Peripheral vascular disease, unspecified: Secondary | ICD-10-CM | POA: Diagnosis not present

## 2016-08-10 DIAGNOSIS — I252 Old myocardial infarction: Secondary | ICD-10-CM | POA: Diagnosis not present

## 2016-08-10 LAB — NM MYOCAR MULTI W/SPECT W/WALL MOTION / EF
LV dias vol: 130 mL (ref 62–150)
LV sys vol: 45 mL
Peak HR: 89 {beats}/min
RATE: 0.09
Rest HR: 47 {beats}/min
SDS: 3
SRS: 3
SSS: 6
TID: 1.18

## 2016-08-10 MED ORDER — TECHNETIUM TC 99M TETROFOSMIN IV KIT
30.0000 | PACK | Freq: Once | INTRAVENOUS | Status: AC | PRN
  Administered 2016-08-10: 30 via INTRAVENOUS

## 2016-08-10 MED ORDER — REGADENOSON 0.4 MG/5ML IV SOLN
INTRAVENOUS | Status: AC
  Administered 2016-08-10: 0.4 mg via INTRAVENOUS
  Filled 2016-08-10: qty 5

## 2016-08-10 MED ORDER — SODIUM CHLORIDE 0.9% FLUSH
INTRAVENOUS | Status: AC
  Administered 2016-08-10: 10 mL via INTRAVENOUS
  Filled 2016-08-10: qty 10

## 2016-08-10 MED ORDER — TECHNETIUM TC 99M TETROFOSMIN IV KIT
10.0000 | PACK | Freq: Once | INTRAVENOUS | Status: AC | PRN
Start: 2016-08-10 — End: 2016-08-10
  Administered 2016-08-10: 10 via INTRAVENOUS

## 2016-08-18 ENCOUNTER — Encounter (HOSPITAL_COMMUNITY): Payer: Self-pay | Admitting: Psychiatry

## 2016-08-18 ENCOUNTER — Ambulatory Visit (INDEPENDENT_AMBULATORY_CARE_PROVIDER_SITE_OTHER): Payer: Medicare HMO | Admitting: Psychiatry

## 2016-08-18 VITALS — BP 123/76 | HR 65 | Ht 70.0 in | Wt 146.8 lb

## 2016-08-18 DIAGNOSIS — F332 Major depressive disorder, recurrent severe without psychotic features: Secondary | ICD-10-CM | POA: Diagnosis not present

## 2016-08-18 MED ORDER — CLONAZEPAM 0.5 MG PO TABS: 0.5000 mg | ORAL_TABLET | Freq: Every day | ORAL | 2 refills | Status: DC | PRN
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
## 2016-08-26 ENCOUNTER — Encounter: Payer: Self-pay | Admitting: Pulmonary Disease

## 2016-08-26 ENCOUNTER — Ambulatory Visit (INDEPENDENT_AMBULATORY_CARE_PROVIDER_SITE_OTHER): Payer: Medicare HMO | Admitting: Pulmonary Disease

## 2016-08-26 DIAGNOSIS — Z23 Encounter for immunization: Secondary | ICD-10-CM

## 2016-08-26 DIAGNOSIS — Z8659 Personal history of other mental and behavioral disorders: Secondary | ICD-10-CM | POA: Insufficient documentation

## 2016-08-26 DIAGNOSIS — F17200 Nicotine dependence, unspecified, uncomplicated: Secondary | ICD-10-CM | POA: Insufficient documentation

## 2016-08-26 DIAGNOSIS — F17210 Nicotine dependence, cigarettes, uncomplicated: Secondary | ICD-10-CM | POA: Insufficient documentation

## 2016-08-26 DIAGNOSIS — R0600 Dyspnea, unspecified: Secondary | ICD-10-CM

## 2016-08-26 DIAGNOSIS — E785 Hyperlipidemia, unspecified: Secondary | ICD-10-CM | POA: Insufficient documentation

## 2016-08-26 MED ORDER — BUDESONIDE-FORMOTEROL FUMARATE 160-4.5 MCG/ACT IN AERO: 2.0000 | INHALATION_SPRAY | Freq: Two times a day (BID) | RESPIRATORY_TRACT | 0 refills | Status: DC

## 2016-08-26 MED ORDER — BUDESONIDE-FORMOTEROL FUMARATE 160-4.5 MCG/ACT IN AERO: 2.0000 | INHALATION_SPRAY | Freq: Two times a day (BID) | RESPIRATORY_TRACT | 3 refills | Status: DC

## 2016-08-26 NOTE — Patient Instructions (Addendum)
   Continue using your Albuterol inhaler as needed for shortness of breath, coughing, or wheezing.  Try to quit smoking cigarettes using nicotine patches and nicotine gum. I recommend starting with the 7mg  patch and 2mg  gum intermittently for cravings. You cannot smoke with this because you could overdose on nicotine and cause you to have a heart attack.   Remember to use your Symbicort inhaler - 2 puffs twice daily every day. You need to brush your teeth, brush your tongue, rinse, gargle & spit afterward to keep from getting thrush.  Call me if you have any questions. I will see you back in 6 weeks.   TESTS ORDERED: 1. Full PFTs on or before next appointment 2. 6MWT on or before next appointment

## 2016-08-26 NOTE — Progress Notes (Signed)
Subjective:    Patient ID: STEVIE CHARTER, male    DOB: 01-02-1961, 55 y.o.   MRN: [id number]  HPI He reports she has noticed progressively increased dyspnea on exertion as well as whole body fatigue. Does cough intermittently and previously was producing a "brown" mucus. Lately he has been producing a "clear, sticky" mucus. He does wheeze intermittently. Denies any hemoptysis. Reports he does have a tightness in his chest with exertion. Denies any other significant chest pain. He reports he gets bronchitis at least once a year. He has had pneumonia once in the past. He has been on Symbicort for 3-4 months and feels it is helping him breathe better. He is using it every other day. He has been using his Albuterol rescue inhaler at least once a day and feels it does help significantly. He uses his inhaler at night before he goes to bed. No nocturnal awakenings with any coughing or wheezing. No fever, chills, or sweats. He reports very rare reflux. No morning brash water taste. Denies any sinus congestion, pressure, drainage, or allergies. Denies any breathing problems as a child growing up.   Review of Systems Does have some urinary hesitancy. No dysuria or hematuria. Denies any headaches. He does have near syncope intermittently but denies any syncopal episodes. A pertinent 14 point review of systems is negative except as per the history of presenting illness.  Allergies  Allergen Reactions  . Augmentin [Amoxicillin-Pot Clavulanate] Rash  . Ace Inhibitors     Current Outpatient Prescriptions on File Prior to Visit  Medication Sig Dispense Refill  . albuterol (PROVENTIL HFA;VENTOLIN HFA) 108 (90 Base) MCG/ACT inhaler Inhale 2 puffs into the lungs every 6 (six) hours as needed for wheezing or shortness of breath.    Marland Kitchen atorvastatin (LIPITOR) 20 MG tablet Take 20 mg by mouth daily.     . budesonide-formoterol (SYMBICORT) 80-4.5 MCG/ACT inhaler Inhale 1 puff into the lungs.     . clonazePAM  (KLONOPIN) 0.5 MG tablet Take 1 tablet (0.5 mg total) by mouth daily as needed for anxiety. 30 tablet 2  . divalproex (DEPAKOTE) 250 MG DR tablet Take 3 tablets (750 mg total) by mouth at bedtime. 270 tablet 2  . fenofibrate micronized (LOFIBRA) 134 MG capsule     . FLUoxetine (PROZAC) 40 MG capsule Take 1 capsule (40 mg total) by mouth every morning. 90 capsule 2  . metoprolol tartrate (LOPRESSOR) 25 MG tablet Take 1 tablet (25 mg total) by mouth 2 (two) times daily. 180 tablet 3  . OLANZapine (ZYPREXA) 5 MG tablet Take 1 tablet (5 mg total) by mouth at bedtime. 90 tablet 2  . temazepam (RESTORIL) 30 MG capsule Take 1 capsule (30 mg total) by mouth at bedtime as needed for sleep. 30 capsule 2   No current facility-administered medications on file prior to visit.     Past Medical History:  Diagnosis Date  . Anxiety   . Arthritis    deg disease, bulging disk,  shoulder level  . Bipolar disorder (HCC)   . Depression    anxiety  . Hyperlipidemia   . Peptic ulcer disease    Review  . Pneumonia   . [name] (peripheral vascular disease) (HCC) 06/18/2015  . Shortness of breath     Past Surgical History:  Procedure Laterality Date  . COLONOSCOPY  03/14/2007   WUJ:WJXBJY colonoscopy and terminal ileoscopy except external hemorrhoids  . COLONOSCOPY N/A 05/05/2013   Procedure: COLONOSCOPY;  Surgeon: [name], MD;  [facility];  Service: Endoscopy;  Laterality: N/A;  1:30  . ESOPHAGOGASTRODUODENOSCOPY  03/14/2007   [id number] antral gastritis with bulbar duodenitis/paucity to postbulbar duodenal folds and biopsy were benign with no evidence of villous atrophy.  Marland Kitchen HAND SURGERY     left, secondary to self-inflicted laceration  . SHOULDER SURGERY     right  . TOE SURGERY     left great toe , amputated-lawnmover accident    Family History  Problem Relation Age of Onset  . Breast cancer Mother     deceased  . Heart disease Father   . Depression Daughter   . Anxiety  disorder Daughter   . Anxiety disorder Son   . Depression Son   . Asthma Brother   . Heart attack Maternal Aunt   . Heart attack Maternal Uncle   . Heart attack Paternal Aunt   . Heart attack Paternal Uncle   . Heart attack Maternal Grandmother   . Heart attack Maternal Grandfather   . Emphysema Maternal Grandfather   . Heart attack Paternal Grandmother   . Heart attack Paternal Grandfather   . Colon cancer Neg Hx   . Liver disease Neg Hx     Social History   Social History  . Marital status: Married    Spouse name: N/A  . Number of children: 2  . Years of education: N/A   Occupational History  . Disabled    Social History Main Topics  . Smoking status: Current Every Day Smoker    Packs/day: 1.50    Years: 40.00    Types: Cigarettes    Start date: 08/04/1971  . Smokeless tobacco: Never Used     Comment: peak rate of 2.5ppd  . Alcohol use No     Comment: last used 2 nights ago, smokes a joint about every 3 days  . Drug use:     Types: Marijuana     Comment: still doing some  . Sexual activity: No   Other Topics Concern  . None   Social History Narrative   Originally from [place]. Previously has lived in [facility] [place] 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 Questionable asbestos exposure. Does have significant exposure to fumes. No mold exposure. No bird exposure. No pets currently.       Objective:   Physical Exam BP 116/66 (BP Location: Left Arm, Cuff Size: Normal)   Pulse (!) 57   Ht 5\' 10"  (1.778 m)   Wt 148 lb 6.4 oz (67.3 kg)   SpO2 97%   BMI 21.29 kg/m  General:  Awake. Alert. No acute distress.  Integument:  Warm & dry. No rash on exposed skin. No bruising. Lymphatics:  No appreciated cervical or supraclavicular lymphadenoapthy. HEENT:  Moist mucus membranes. No oral ulcers. No scleral injection or icterus. Mild bilateral nasal turbinate swelling. Cardiovascular:  Regular rate. No edema.  Normal S1 & S2.  Pulmonary:  Good aeration & clear to auscultation bilaterally with intermittent squeaks. Symmetric chest wall expansion. No accessory muscle use. Abdomen: Soft. Normal bowel sounds. Nondistended. Grossly nontender. Musculoskeletal:  Normal bulk and tone. Hand grip strength 5/5 bilaterally. No joint deformity or effusion appreciated. Neurological:  CN 2-12 grossly in tact. No meningismus. Moving all 4 extremities equally. Symmetric brachioradialis deep tendon reflexes. Psychiatric:  Mood and affect congruent. Speech normal rhythm, rate & tone.   IMAGING CXR PA/LAT 05/12/13 (personally reviewed by me): Hyperinflation with flattening  of the diaphragms and deep sulci bilaterally. No nodule or opacity appreciated. No pleural effusion. Heart normal in size & mediastinum normal in contour. Trace amount of fluid within right fissure.  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 Mild to moderate sized apical infarct with mild peri-infarct ischemia. Small amount of myocardium currently a chair pretty. EF >65%. No ST segment deviation during stress test.   LABS 01/21/09 UDS:  Benzos & Opiates Positive  8/27/8 UDS:  Benzos, Opiates, & THC Positive    Assessment & Plan:  55 y.o. with ongoing dyspnea and tobacco use disorder. With patient's hyperinflation on chest x-ray imaging from 2014 and long-standing history of tobacco use I do believe it's highly probable he has underlying COPD and possibly even emphysema. I spent over 3 minutes counseling the patient will need for complete tobacco cessation to prevent worsening of his arterial vascular disease as well as his underlying lung function. We will need to obtain pulmonary function testing to establish his diagnosis. Additionally, I spent a significant amount time today counseling him on the need for oral hygiene with Symbicort as well as appropriate frequency medication use. I instructed the  patient contact my office if he had any new breathing problems before his next appointment.  1. Dyspnea:  Increasing Symbicort to 160/4.5. Patient counseled on appropriate technique and frequency medication use. Checking full pulmonary function testing as well as 6 minute walk test on room air. Consider screening for alpha-1 antitrypsin deficiency if patient's coronary function testing confirms COPD. 2. Tobacco Use Disorder: Previously intolerant of Chantix. Recommended nicotine replacement cautiously with patches and gum at low dose. Spent over 3 minutes counseling the patient. Plan for referral for low dose chest CT for lung cancer screening after next appointment. 3. Health Maintenance:  Administering Influenza Vaccine today. Plan for Pneumovax at next appointment. 4. Follow-up:  Patient will return to clinic in 6 weeks or sooner if needed.   Donna ChristenJennings E. Jamison NeighborNestor, M.D. [facility] Pager:  [phone] After 3pm or if no response, call [phone] 3:33 PM 08/26/16

## 2016-09-15 ENCOUNTER — Ambulatory Visit (INDEPENDENT_AMBULATORY_CARE_PROVIDER_SITE_OTHER): Payer: Medicare HMO | Admitting: Psychiatry

## 2016-09-15 ENCOUNTER — Encounter (HOSPITAL_COMMUNITY): Payer: Self-pay | Admitting: Psychiatry

## 2016-09-15 VITALS — BP 120/61 | HR 53 | Ht 70.0 in | Wt 150.0 lb

## 2016-09-15 DIAGNOSIS — Z818 Family history of other mental and behavioral disorders: Secondary | ICD-10-CM

## 2016-09-15 DIAGNOSIS — Z79899 Other long term (current) drug therapy: Secondary | ICD-10-CM

## 2016-09-15 DIAGNOSIS — Z825 Family history of asthma and other chronic lower respiratory diseases: Secondary | ICD-10-CM

## 2016-09-15 DIAGNOSIS — Z800 Family history of malignant neoplasm of digestive organs: Secondary | ICD-10-CM

## 2016-09-15 DIAGNOSIS — Z803 Family history of malignant neoplasm of breast: Secondary | ICD-10-CM

## 2016-09-15 DIAGNOSIS — F332 Major depressive disorder, recurrent severe without psychotic features: Secondary | ICD-10-CM | POA: Diagnosis not present

## 2016-09-15 DIAGNOSIS — Z8249 Family history of ischemic heart disease and other diseases of the circulatory system: Secondary | ICD-10-CM

## 2016-09-15 DIAGNOSIS — Z808 Family history of malignant neoplasm of other organs or systems: Secondary | ICD-10-CM

## 2016-09-15 DIAGNOSIS — F17210 Nicotine dependence, cigarettes, uncomplicated: Secondary | ICD-10-CM

## 2016-09-15 MED ORDER — TEMAZEPAM 30 MG PO CAPS: 30.0000 mg | ORAL_CAPSULE | Freq: Every evening | ORAL | 2 refills | Status: DC | PRN
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
## 2016-10-08 ENCOUNTER — Encounter (HOSPITAL_COMMUNITY): Payer: Self-pay

## 2016-10-08 ENCOUNTER — Encounter: Payer: Self-pay | Admitting: Cardiovascular Disease

## 2016-10-08 ENCOUNTER — Ambulatory Visit (HOSPITAL_COMMUNITY): Payer: Self-pay | Admitting: Psychiatry

## 2016-10-08 ENCOUNTER — Ambulatory Visit (INDEPENDENT_AMBULATORY_CARE_PROVIDER_SITE_OTHER): Payer: Medicare HMO | Admitting: Cardiovascular Disease

## 2016-10-08 VITALS — BP 120/80 | HR 80 | Ht 70.0 in | Wt 142.0 lb

## 2016-10-08 DIAGNOSIS — R0600 Dyspnea, unspecified: Secondary | ICD-10-CM

## 2016-10-08 DIAGNOSIS — R001 Bradycardia, unspecified: Secondary | ICD-10-CM | POA: Diagnosis not present

## 2016-10-08 DIAGNOSIS — R5383 Other fatigue: Secondary | ICD-10-CM | POA: Diagnosis not present

## 2016-10-08 DIAGNOSIS — I25118 Atherosclerotic heart disease of native coronary artery with other forms of angina pectoris: Secondary | ICD-10-CM

## 2016-10-08 DIAGNOSIS — E7800 Pure hypercholesterolemia, unspecified: Secondary | ICD-10-CM

## 2016-10-08 DIAGNOSIS — R0609 Other forms of dyspnea: Secondary | ICD-10-CM

## 2016-10-08 DIAGNOSIS — R9439 Abnormal result of other cardiovascular function study: Secondary | ICD-10-CM | POA: Diagnosis not present

## 2016-10-08 MED ORDER — ASPIRIN EC 81 MG PO TBEC: 81.0000 mg | DELAYED_RELEASE_TABLET | Freq: Every day | ORAL | Status: DC

## 2016-10-08 NOTE — Progress Notes (Signed)
SUBJECTIVE: The patient returns for follow-up after undergoing cardiovascular testing performed for the evaluation of shortness of breath and fatigue.  Nuclear stress test 08/10/16 showed small prior myocardial infarction in the basal septum with mild to moderate sized apical infarct with mild peri-infarct ischemia. It was deemed a low risk study. Left ventricle systolic function was normal.  He saw a pulmonologist who has adjusted his medications and he feels like his shortness of breath is improving. He denies chest pain. He continues to smoke but is trying to quit. He is wearing a nicotine patch.  Review of Systems: As per "subjective", otherwise negative.  Allergies  Allergen Reactions  . Augmentin [Amoxicillin-Pot Clavulanate] Rash  . Ace Inhibitors     Current Outpatient Prescriptions  Medication Sig Dispense Refill  . albuterol (PROVENTIL HFA;VENTOLIN HFA) 108 (90 Base) MCG/ACT inhaler Inhale 2 puffs into the lungs every 6 (six) hours as needed for wheezing or shortness of breath.    Marland Kitchen. atorvastatin (LIPITOR) 20 MG tablet Take 20 mg by mouth daily.     . budesonide-formoterol (SYMBICORT) 160-4.5 MCG/ACT inhaler Inhale 2 puffs into the lungs 2 (two) times daily. 1 Inhaler 0  . budesonide-formoterol (SYMBICORT) 160-4.5 MCG/ACT inhaler Inhale 2 puffs into the lungs 2 (two) times daily. 1 Inhaler 3  . clonazePAM (KLONOPIN) 0.5 MG tablet Take 1 tablet (0.5 mg total) by mouth daily as needed for anxiety. 30 tablet 2  . divalproex (DEPAKOTE) 250 MG DR tablet Take 3 tablets (750 mg total) by mouth at bedtime. 270 tablet 2  . fenofibrate micronized (LOFIBRA) 134 MG capsule     . FLUoxetine (PROZAC) 40 MG capsule Take 1 capsule (40 mg total) by mouth every morning. 90 capsule 2  . metoprolol tartrate (LOPRESSOR) 25 MG tablet Take 12.5 mg by mouth 2 (two) times daily.    . nicotine (NICODERM CQ - DOSED IN MG/24 HOURS) 21 mg/24hr patch Place 21 mg onto the skin daily.    Marland Kitchen. OLANZapine  (ZYPREXA) 5 MG tablet Take 1 tablet (5 mg total) by mouth at bedtime. 90 tablet 2  . temazepam (RESTORIL) 30 MG capsule Take 1 capsule (30 mg total) by mouth at bedtime as needed for sleep. 30 capsule 2   No current facility-administered medications for this visit.     Past Medical History:  Diagnosis Date  . Anxiety   . Arthritis    deg disease, bulging disk,  shoulder level  . Bipolar disorder (HCC)   . Depression    anxiety  . Hyperlipidemia   . Peptic ulcer disease    Review  . Pneumonia   . [name] (peripheral vascular disease) (HCC) 06/18/2015  . Shortness of breath     Past Surgical History:  Procedure Laterality Date  . COLONOSCOPY  03/14/2007   [id number]:Normal colonoscopy and terminal ileoscopy except external hemorrhoids  . COLONOSCOPY N/A 05/05/2013   Procedure: COLONOSCOPY;  Surgeon: Corbin Adeobert M Rourk, MD;  Location: [facility];  Service: Endoscopy;  Laterality: N/A;  1:30  . ESOPHAGOGASTRODUODENOSCOPY  03/14/2007   [id number]:[id number]:Nonerosive antral gastritis with bulbar duodenitis/paucity to postbulbar duodenal folds and biopsy were benign with no evidence of villous atrophy.  Marland Kitchen. HAND SURGERY     left, secondary to self-inflicted laceration  . SHOULDER SURGERY     right  . TOE SURGERY     left great toe , amputated-lawnmover accident    Social History   Social History  . Marital status: Married    Spouse name:  N/A  . Number of children: 2  . Years of education: N/A   Occupational History  . Disabled    Social History Main Topics  . Smoking status: Current Every Day Smoker    Packs/day: 1.50    Years: 40.00    Types: Cigarettes    Start date: 08/04/1971  . Smokeless tobacco: Never Used     Comment: peak rate of 2.5ppd  . Alcohol use No     Comment: last used 2 nights ago, smokes a joint about every 3 days  . Drug use:     Types: Marijuana     Comment: still doing some  . Sexual activity: No   Other Topics Concern  . Not on file   Social History Narrative    Originally from [place]. Previously has lived in [place] 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 Questionable asbestos exposure. Does have significant exposure to fumes. No mold exposure. No bird exposure. No pets currently.      Vitals:   10/08/16 1130  BP: 120/80  Pulse: 80  SpO2: 95%  Weight: 142 lb (64.4 kg)  Height: 5\' 10"  (1.778 m)    PHYSICAL EXAM General: NAD HEENT: Normal. Neck: No JVD, no thyromegaly. Lungs: Diminished sounds with expiratory wheezes. CV: Nondisplaced PMI.  Regular rate and rhythm, normal S1/S2, no S3/S4, no murmur. No pretibial or periankle edema.   Abdomen: Soft, nontender, no distention.  Neurologic: Alert and oriented.  Psych: Normal affect. Skin: Normal. Musculoskeletal: No gross deformities.   ECG: Most recent ECG reviewed.      ASSESSMENT AND PLAN: 1. SOB/fatigue/CAD: Symptoms likely very well be due to advanced COPD with improvement after medication adjustments. Stress test results reviewed above (low risk). Will manage CAD medically. Will start ASA 81 mg. Already on beta blocker and statin.  2. Bradycardia: Resolved with reduction of metoprolol dose.  3. Tobacco abuse: Cessation counseling given (1 minute).  Dispo: fu 6 months.   Prentice DockerSuresh Koneswaran, M.D., F.A.C.C.

## 2016-10-08 NOTE — Patient Instructions (Addendum)
Medication Instructions:   Begin Aspirin 81mg  daily.  Continue all other medications.    Labwork: none  Testing/Procedures: none  Follow-Up: Your physician wants you to follow up in: 6 months.  You will receive a reminder letter in the mail one-two months in advance.  If you don't receive a letter, please call our office to schedule the follow up appointment   Any Other Special Instructions Will Be Listed Below (If Applicable).  If you need a refill on your cardiac medications before your next appointment, please call your pharmacy.

## 2016-10-08 NOTE — Addendum Note (Signed)
Addended by: Lesle ChrisHILL, ANGELA G on: 10/08/2016 11:49 AM   Modules accepted: Orders

## 2016-10-09 ENCOUNTER — Other Ambulatory Visit: Payer: Medicare HMO

## 2016-10-09 ENCOUNTER — Encounter: Payer: Self-pay | Admitting: Pulmonary Disease

## 2016-10-09 ENCOUNTER — Ambulatory Visit (HOSPITAL_COMMUNITY)
Admission: RE | Admit: 2016-10-09 | Discharge: 2016-10-09 | Disposition: A | Discharge status: Home / Self Care | Payer: Medicare HMO | Source: Ambulatory Visit | Attending: Pulmonary Disease | Admitting: Pulmonary Disease

## 2016-10-09 ENCOUNTER — Ambulatory Visit (INDEPENDENT_AMBULATORY_CARE_PROVIDER_SITE_OTHER): Payer: Medicare HMO | Admitting: Pulmonary Disease

## 2016-10-09 VITALS — BP 126/68 | HR 66 | Ht 70.0 in | Wt 149.0 lb

## 2016-10-09 DIAGNOSIS — F17200 Nicotine dependence, unspecified, uncomplicated: Secondary | ICD-10-CM

## 2016-10-09 DIAGNOSIS — R0600 Dyspnea, unspecified: Secondary | ICD-10-CM | POA: Insufficient documentation

## 2016-10-09 DIAGNOSIS — R0602 Shortness of breath: Secondary | ICD-10-CM

## 2016-10-09 DIAGNOSIS — Z23 Encounter for immunization: Secondary | ICD-10-CM

## 2016-10-09 DIAGNOSIS — J449 Chronic obstructive pulmonary disease, unspecified: Secondary | ICD-10-CM

## 2016-10-09 HISTORY — DX: Chronic obstructive pulmonary disease, unspecified: J44.9

## 2016-10-09 LAB — PULMONARY FUNCTION TEST
DL/VA % pred: 38 %
DL/VA: 1.8 ml/min/mmHg/L
DLCO unc % pred: 36 %
DLCO unc: 11.69 ml/min/mmHg
FEF 25-75 Post: 0.77 L/sec
FEF 25-75 Pre: 0.5 L/sec
FEF2575-%Change-Post: 53 %
FEF2575-%Pred-Post: 24 %
FEF2575-%Pred-Pre: 15 %
FEV1-%Change-Post: 18 %
FEV1-%Pred-Post: 49 %
FEV1-%Pred-Pre: 41 %
FEV1-Post: 1.84 L
FEV1-Pre: 1.55 L
FEV1FVC-%Change-Post: 9 %
FEV1FVC-%Pred-Pre: 57 %
FEV6-%Change-Post: 12 %
FEV6-%Pred-Post: 73 %
FEV6-%Pred-Pre: 65 %
FEV6-Post: 3.45 L
FEV6-Pre: 3.06 L
FEV6FVC-%Change-Post: 3 %
FEV6FVC-%Pred-Post: 92 %
FEV6FVC-%Pred-Pre: 89 %
FVC-%Change-Post: 8 %
FVC-%Pred-Post: 78 %
FVC-%Pred-Pre: 72 %
FVC-Post: 3.86 L
FVC-Pre: 3.56 L
Post FEV1/FVC ratio: 48 %
Post FEV6/FVC ratio: 89 %
Pre FEV1/FVC ratio: 43 %
Pre FEV6/FVC Ratio: 86 %
RV % pred: 166 %
RV: 3.59 L
TLC % pred: 105 %
TLC: 7.39 L

## 2016-10-09 MED ORDER — ALBUTEROL SULFATE (2.5 MG/3ML) 0.083% IN NEBU
2.5000 mg | INHALATION_SOLUTION | Freq: Once | RESPIRATORY_TRACT | Status: AC
  Administered 2016-10-09: 2.5 mg via RESPIRATORY_TRACT

## 2016-10-09 MED ORDER — TIOTROPIUM BROMIDE MONOHYDRATE 2.5 MCG/ACT IN AERS: 2.0000 | INHALATION_SPRAY | Freq: Every day | RESPIRATORY_TRACT | 3 refills | Status: DC

## 2016-10-09 NOTE — Progress Notes (Signed)
Test reviewed.  

## 2016-10-09 NOTE — Addendum Note (Signed)
Addended by: Velvet BatheAULFIELD, ASHLEY L on: 10/09/2016 03:30 PM   Modules accepted: Orders

## 2016-10-09 NOTE — Progress Notes (Signed)
Subjective:    Patient ID: Jeffrey [name], male    DOB: 1961/11/16, 55 y.o.   MRN: [id number]  C.C.:  Follow-up for Severe COPD & Tobacco Use Disorder.  HPI Severe COPD:  Identified on spirometry today. Symbicort dose increased to 160/4.5 at last appointment. He reports he has improved dyspnea but still having dyspnea with walking and carrying or walking & talking. He reports he has been coughing intermittently and producing a "yellow, gray" mucus. Denies any wheezing. He is using his rescue inhaler once daily and feels it does help.   Tobacco Use Disorder: Previously intolerant of Chantix. Recommended nicotine replacement at last appointment. He reports he started the 21 mg patch and hasn't yet had a cigarette today.   Review of Systems No chest pain or tightness. No fever, chills, or sweats. No nausea, emesis, or abdominal pain.   Allergies  Allergen Reactions  . Augmentin [Amoxicillin-Pot Clavulanate] Rash  . Ace Inhibitors     Current Outpatient Prescriptions on File Prior to Visit  Medication Sig Dispense Refill  . albuterol (PROVENTIL HFA;VENTOLIN HFA) 108 (90 Base) MCG/ACT inhaler Inhale 2 puffs into the lungs every 6 (six) hours as needed for wheezing or shortness of breath.    Marland Kitchen. aspirin EC 81 MG tablet Take 1 tablet (81 mg total) by mouth daily.    Marland Kitchen. atorvastatin (LIPITOR) 20 MG tablet Take 20 mg by mouth daily.     . budesonide-formoterol (SYMBICORT) 160-4.5 MCG/ACT inhaler Inhale 2 puffs into the lungs 2 (two) times daily. 1 Inhaler 3  . clonazePAM (KLONOPIN) 0.5 MG tablet Take 1 tablet (0.5 mg total) by mouth daily as needed for anxiety. 30 tablet 2  . divalproex (DEPAKOTE) 250 MG DR tablet Take 3 tablets (750 mg total) by mouth at bedtime. 270 tablet 2  . fenofibrate micronized (LOFIBRA) 134 MG capsule     . FLUoxetine (PROZAC) 40 MG capsule Take 1 capsule (40 mg total) by mouth every morning. 90 capsule 2  . metoprolol tartrate (LOPRESSOR) 25 MG tablet Take 12.5 mg by  mouth 2 (two) times daily.    . nicotine (NICODERM CQ - DOSED IN MG/24 HOURS) 21 mg/24hr patch Place 21 mg onto the skin daily.    Marland Kitchen. OLANZapine (ZYPREXA) 5 MG tablet Take 1 tablet (5 mg total) by mouth at bedtime. 90 tablet 2  . temazepam (RESTORIL) 30 MG capsule Take 1 capsule (30 mg total) by mouth at bedtime as needed for sleep. 30 capsule 2   No current facility-administered medications on file prior to visit.     Past Medical History:  Diagnosis Date  . Anxiety   . Arthritis    deg disease, bulging disk,  shoulder level  . Bipolar disorder (HCC)   . Depression    anxiety  . Hyperlipidemia   . Peptic ulcer disease    Review  . Pneumonia   . [name] (peripheral vascular disease) (HCC) 06/18/2015  . Shortness of breath     Past Surgical History:  Procedure Laterality Date  . COLONOSCOPY  03/14/2007   [id number]:Normal colonoscopy and terminal ileoscopy except external hemorrhoids  . COLONOSCOPY N/A 05/05/2013   Procedure: COLONOSCOPY;  Surgeon: Corbin Adeobert M Rourk, MD;  Location: [facility];  Service: Endoscopy;  Laterality: N/A;  1:30  . ESOPHAGOGASTRODUODENOSCOPY  03/14/2007   [id number]:[id number]:Nonerosive antral gastritis with bulbar duodenitis/paucity to postbulbar duodenal folds and biopsy were benign with no evidence of villous atrophy.  Marland Kitchen. HAND SURGERY     left, secondary to  self-inflicted laceration  . SHOULDER SURGERY     right  . TOE SURGERY     left great toe , amputated-lawnmover accident    Family History  Problem Relation Age of Onset  . Breast cancer Mother     deceased  . Heart disease Father   . Depression Daughter   . Anxiety disorder Daughter   . Anxiety disorder Son   . Depression Son   . Asthma Brother   . Heart attack Maternal Aunt   . Heart attack Maternal Uncle   . Heart attack Paternal Aunt   . Heart attack Paternal Uncle   . Heart attack Maternal Grandmother   . Heart attack Maternal Grandfather   . Emphysema Maternal Grandfather   . Heart attack Paternal  Grandmother   . Heart attack Paternal Grandfather   . Colon cancer Neg Hx   . Liver disease Neg Hx     Social History   Social History  . Marital status: Divorced    Spouse name: N/A  . Number of children: 2  . Years of education: N/A   Occupational History  . Disabled    Social History Main Topics  . Smoking status: Current Every Day Smoker    Packs/day: 1.50    Years: 40.00    Types: Cigarettes    Start date: 08/04/1971  . Smokeless tobacco: Never Used     Comment: peak rate of 2.5ppd  . Alcohol use No     Comment: last used 2 nights ago, smokes a joint about every 3 days  . Drug use:     Types: Marijuana     Comment: still doing some  . Sexual activity: No   Other Topics Concern  . None   Social History Narrative   Originally from [place]. Previously has lived in [facility] [place] 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 Questionable asbestos exposure. Does have significant exposure to fumes. No mold exposure. No bird exposure. No pets currently.       Objective:   Physical Exam BP 126/68 (BP Location: Right Arm, Cuff Size: Normal)   Pulse 66   Ht 5\' 10"  (1.778 m)   Wt 149 lb (67.6 kg)   SpO2 97%   BMI 21.38 kg/m  General:  Awake. Thin Caucasian male. No distress. Integument:  Warm & dry. No rash on exposed skin.  Lymphatics:  No appreciated cervical or supraclavicular lymphadenoapthy. HEENT:  Moist mucus membranes. No oral ulcers. No scleral icterus. Cardiovascular:  Regular rate and rhythm. No edema. Normal S1 & S2.  Pulmonary:  Clear with auscultation bilaterally. Normal work of breathing on room air. Good aeration bilaterally. Abdomen: Soft. Normal bowel sounds. Nontender.  PFT 10/09/16:FVC 3.56 L (72%) FEV1 1.55 L (41%) FEV1/FVC 0.43 FEF 25-75 0.50 L (15%) positive bronchodilator response TLC 7.39 L (105%) RV 166% ERV 114% DLCO uncorrected 36%  10/09/16:  Walked 444 meters / Baseline Sat 97%  on RA / Nadir Sat 98% on RA  IMAGING CXR PA/LAT 05/12/13 (previously reviewed by me): Hyperinflation with flattening of the diaphragms and deep sulci bilaterally. No nodule or opacity appreciated. No pleural effusion. Heart normal in size & mediastinum normal in contour. Trace amount of fluid within right fissure.  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 Mild to moderate sized apical infarct with mild peri-infarct ischemia. Small amount of myocardium currently a chair pretty. EF >  65%. No ST segment deviation during stress test.   LABS 01/21/09 UDS:  Benzos & Opiates Positive  8/27/8 UDS:  Benzos, Opiates, & THC Positive    Assessment & Plan:  55 y.o. with Severe COPD and tobacco use disorder. Patient does have ongoing tobacco use although he has decreased his use significantly since using nicotine patches. Patient's spirometry today does indeed show severe COPD and despite a severe reduction in his carbon monoxide capacity he exhibits no oxygen requirement with a 6 minute walk test today. I believe he would benefit from the addition of Spiriva to his regimen given his significant bronchodilator response. I did caution the patient against potential adverse effects on Spiriva. I instructed him to contact my office if he had any new breathing problems or questions before next appointment.  1. Severe COPD:  Screening for alpha-1 antitrypsin deficiency today. Continuing Symbicort and Ventolin inhalers. Adding Spiriva Respimat to regimen. Repeat spirometry with bronchodilator challenge at next appointment. 2. Tobacco Use Disorder: Patient encouraged to continue using nicotine replacement. Counseled for over 3 minutes on the need for continued tobacco abstinence as this is his first day/5 hours since having quit smoking.  3. Health Maintenance:  S/P Influenza Vaccine October 2017. Administering Pneumovax 23 today.  4. Follow-up:  Patient will  return to clinic in 3 months or sooner if needed.   Donna ChristenJennings E. Jamison NeighborNestor, M.D. [facility] Pager:  [phone] After 3pm or if no response, call [phone] 3:08 PM 10/09/16

## 2016-10-09 NOTE — Addendum Note (Signed)
Addended by: Velvet BatheAULFIELD, Meckenzie Balsley L on: 10/09/2016 03:35 PM   Modules accepted: Orders

## 2016-10-09 NOTE — Patient Instructions (Addendum)
   Call me if you have any new breathing problems before your next appointment.  If you have any blurry vision, trouble peeing, or other adverse reaction to the Spiriva inhaler we are starting today stop taking it and let me know.  Inhale 2 twists of your Spiriva inhaler once daily. Keep using your Symbicort inhaler twice a day as directed. Keep using your Albuterol/Ventolin inhaler as needed.  I will see you back in 3 months or sooner if needed.   TESTS ORDERED: 1. Serum Alpha-1 Antitrypsin Phenotype today 2. Spirometry with bronchodilator challenge at next appointment

## 2016-10-09 NOTE — Addendum Note (Signed)
Addended by: Velvet BatheAULFIELD, Kadar Chance L on: 10/09/2016 03:57 PM   Modules accepted: Orders

## 2016-10-17 LAB — ALPHA-1 ANTITRYPSIN PHENOTYPE: A-1 Antitrypsin: 215 mg/dL — ABNORMAL HIGH (ref 83–199)

## 2016-11-09 ENCOUNTER — Encounter (HOSPITAL_COMMUNITY): Payer: Self-pay | Admitting: Psychiatry

## 2016-11-09 ENCOUNTER — Ambulatory Visit (INDEPENDENT_AMBULATORY_CARE_PROVIDER_SITE_OTHER): Payer: Medicare HMO | Admitting: Psychiatry

## 2016-11-09 VITALS — BP 103/65 | HR 58 | Ht 70.0 in | Wt 151.8 lb

## 2016-11-09 DIAGNOSIS — Z818 Family history of other mental and behavioral disorders: Secondary | ICD-10-CM

## 2016-11-09 DIAGNOSIS — Z8249 Family history of ischemic heart disease and other diseases of the circulatory system: Secondary | ICD-10-CM

## 2016-11-09 DIAGNOSIS — Z803 Family history of malignant neoplasm of breast: Secondary | ICD-10-CM | POA: Diagnosis not present

## 2016-11-09 DIAGNOSIS — Z825 Family history of asthma and other chronic lower respiratory diseases: Secondary | ICD-10-CM

## 2016-11-09 DIAGNOSIS — F332 Major depressive disorder, recurrent severe without psychotic features: Secondary | ICD-10-CM

## 2016-11-09 DIAGNOSIS — F17210 Nicotine dependence, cigarettes, uncomplicated: Secondary | ICD-10-CM

## 2016-11-09 DIAGNOSIS — Z8489 Family history of other specified conditions: Secondary | ICD-10-CM

## 2016-11-09 DIAGNOSIS — Z9889 Other specified postprocedural states: Secondary | ICD-10-CM

## 2016-11-09 MED ORDER — DIVALPROEX SODIUM 250 MG PO DR TAB: 750.0000 mg | DELAYED_RELEASE_TABLET | Freq: Every day | ORAL | 2 refills | Status: DC

## 2016-11-09 MED ORDER — FLUOXETINE HCL 40 MG PO CAPS: 40.0000 mg | ORAL_CAPSULE | ORAL | 2 refills | Status: DC

## 2016-11-09 MED ORDER — TEMAZEPAM 30 MG PO CAPS: 30.0000 mg | ORAL_CAPSULE | Freq: Every evening | ORAL | 2 refills | Status: DC | PRN

## 2016-11-09 MED ORDER — CLONAZEPAM 0.5 MG PO TABS: 0.5000 mg | ORAL_TABLET | Freq: Every day | ORAL | 2 refills | Status: DC | PRN

## 2016-11-09 MED ORDER — OLANZAPINE 5 MG PO TABS: 5.0000 mg | ORAL_TABLET | Freq: Every day | ORAL | 2 refills | Status: DC
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
## 2016-11-27 ENCOUNTER — Ambulatory Visit (INDEPENDENT_AMBULATORY_CARE_PROVIDER_SITE_OTHER)
Admission: RE | Admit: 2016-11-27 | Discharge: 2016-11-27 | Disposition: A | Discharge status: Home / Self Care | Payer: Medicare HMO | Source: Ambulatory Visit | Attending: Pulmonary Disease | Admitting: Pulmonary Disease

## 2016-11-27 ENCOUNTER — Other Ambulatory Visit: Payer: Medicare HMO

## 2016-11-27 ENCOUNTER — Ambulatory Visit (INDEPENDENT_AMBULATORY_CARE_PROVIDER_SITE_OTHER): Payer: Medicare HMO | Admitting: Pulmonary Disease

## 2016-11-27 ENCOUNTER — Encounter: Payer: Self-pay | Admitting: Pulmonary Disease

## 2016-11-27 VITALS — BP 122/64 | HR 63 | Ht 70.0 in | Wt 154.0 lb

## 2016-11-27 DIAGNOSIS — J209 Acute bronchitis, unspecified: Secondary | ICD-10-CM

## 2016-11-27 DIAGNOSIS — J449 Chronic obstructive pulmonary disease, unspecified: Secondary | ICD-10-CM

## 2016-11-27 DIAGNOSIS — F17200 Nicotine dependence, unspecified, uncomplicated: Secondary | ICD-10-CM

## 2016-11-27 MED ORDER — LEVOFLOXACIN 500 MG PO TABS: 500.0000 mg | ORAL_TABLET | Freq: Every day | ORAL | 0 refills | Status: AC

## 2016-11-27 NOTE — Progress Notes (Signed)
Subjective:    Patient ID: Jeffrey [name], male    DOB: December 20, 1960, 56 y.o.   MRN: [id number]  C.C.:  Follow-up for Severe COPD & Tobacco Use Disorder.  HPI Severe COPD:  Prescribed Symbicort & given a sample of Spiriva Respimat to try at last appointment. He reports starting 4 days ago he starting having increased dyspnea. He reports his dyspnea seems to be improving. He reports he has been coughing up more mucus that he describes as "yellow" and "sticky". He reports little flecks of blood in his mucus. He is producing 1/2 a cup of mucus a day. He has had more wheezing at night.   Tobacco Use Disorder: Previously intolerant of Chantix. Recommended nicotine replacement at last appointment. He has cut back to 1/2ppd. He is still doing patches.   Review of Systems He reports his chest was previously tight but that has improved. He reports more chest tightness in the cold air. No chest pain. Has had some chest pressure. No fever or chills. He does have some mild night sweats. No sore throat or sinus congestion.   Allergies  Allergen Reactions  . Augmentin [Amoxicillin-Pot Clavulanate] Rash  . Ace Inhibitors     Current Outpatient Prescriptions on File Prior to Visit  Medication Sig Dispense Refill  . albuterol (PROVENTIL HFA;VENTOLIN HFA) 108 (90 Base) MCG/ACT inhaler Inhale 2 puffs into the lungs every 6 (six) hours as needed for wheezing or shortness of breath.    Marland Kitchen aspirin EC 81 MG tablet Take 1 tablet (81 mg total) by mouth daily.    Marland Kitchen atorvastatin (LIPITOR) 20 MG tablet Take 20 mg by mouth daily.     . budesonide-formoterol (SYMBICORT) 160-4.5 MCG/ACT inhaler Inhale 2 puffs into the lungs 2 (two) times daily. 1 Inhaler 3  . clonazePAM (KLONOPIN) 0.5 MG tablet Take 1 tablet (0.5 mg total) by mouth daily as needed for anxiety. 30 tablet 2  . divalproex (DEPAKOTE) 250 MG DR tablet Take 3 tablets (750 mg total) by mouth at bedtime. 270 tablet 2  . fenofibrate micronized (LOFIBRA) 134 MG  capsule Take 134 mg by mouth daily before breakfast.     . FLUoxetine (PROZAC) 40 MG capsule Take 1 capsule (40 mg total) by mouth every morning. 90 capsule 2  . metoprolol tartrate (LOPRESSOR) 25 MG tablet Take 12.5 mg by mouth 2 (two) times daily.    Marland Kitchen OLANZapine (ZYPREXA) 5 MG tablet Take 1 tablet (5 mg total) by mouth at bedtime. 90 tablet 2  . temazepam (RESTORIL) 30 MG capsule Take 1 capsule (30 mg total) by mouth at bedtime as needed for sleep. 30 capsule 2  . Tiotropium Bromide Monohydrate (SPIRIVA RESPIMAT) 2.5 MCG/ACT AERS Inhale 2 puffs into the lungs daily. 1 Inhaler 3  . nicotine (NICODERM CQ - DOSED IN MG/24 HOURS) 21 mg/24hr patch Place 21 mg onto the skin daily.     No current facility-administered medications on file prior to visit.     Past Medical History:  Diagnosis Date  . Anxiety   . Arthritis    deg disease, bulging disk,  shoulder level  . Bipolar disorder (HCC)   . 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 Depression    anxiety  . Hyperlipidemia   . Peptic ulcer disease    Review  . Pneumonia   . [name] (peripheral vascular disease) (HCC) 06/18/2015  . Shortness of breath     Past Surgical History:  Procedure Laterality Date  . COLONOSCOPY  03/14/2007  ZOX:[id number]:Normal colonoscopy and terminal ileoscopy except external hemorrhoids  . COLONOSCOPY N/A 05/05/2013   Procedure: COLONOSCOPY;  Surgeon: Corbin Adeobert M Rourk, MD;  [facility];  Service: Endoscopy;  Laterality: N/A;  1:30  . ESOPHAGOGASTRODUODENOSCOPY  03/14/2007   WUJ:[id number]:Nonerosive antral gastritis with bulbar duodenitis/paucity to postbulbar duodenal folds and biopsy were benign with no evidence of villous atrophy.  Marland Kitchen. HAND SURGERY     left, secondary to self-inflicted laceration  . SHOULDER SURGERY     right  . TOE SURGERY     left great toe , amputated-lawnmover accident    Family History  Problem Relation Age of Onset  . Breast cancer Mother     deceased  . Heart disease Father   . Depression  Daughter   . Anxiety disorder Daughter   . Anxiety disorder Son   . Depression Son   . Asthma Brother   . Heart attack Maternal Aunt   . Heart attack Maternal Uncle   . Heart attack Paternal Aunt   . Heart attack Paternal Uncle   . Heart attack Maternal Grandmother   . Heart attack Maternal Grandfather   . Emphysema Maternal Grandfather   . Heart attack Paternal Grandmother   . Heart attack Paternal Grandfather   . Colon cancer Neg Hx   . Liver disease Neg Hx     Social History   Social History  . Marital status: Divorced    Spouse name: N/A  . Number of children: 2  . Years of education: N/A   Occupational History  . Disabled    Social History Main Topics  . Smoking status: Current Every Day Smoker    Packs/day: 1.50    Years: 40.00    Types: Cigarettes    Start date: 08/04/1971  . Smokeless tobacco: Never Used     Comment: peak rate of 2.5ppd, 1/2ppd on 11/27/2016  . Alcohol use No     Comment: last used 2 nights ago, smokes a joint about every 3 days  . Drug use:     Types: Marijuana     Comment: still doing some  . Sexual activity: No   Other Topics Concern  . None   Social History Narrative   Originally from [place]. [place] 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 Questionable asbestos exposure. Does have significant exposure to fumes. No mold exposure. No bird exposure. No pets currently.       Objective:   Physical Exam BP 122/64 (BP Location: Left Arm, Cuff Size: Normal)   Pulse 63   Ht 5\' 10"  (1.778 m)   Wt 154 lb (69.9 kg)   SpO2 96%   BMI 22.10 kg/m  General:  Awake. Comfortable. No distress. Integument:  Warm & dry. No rash on exposed skin.  Lymphatics:  No appreciated cervical or supraclavicular lymphadenoapthy. HEENT:  Moist mucus membranes. No scleral injection. No oral ulcers. Cardiovascular:  Regular rate and rhythm. No edema. Normal S1 & S2.    Pulmonary: Slightly diminished breath sounds bilaterally. Otherwise clear with auscultation. Normal work of breathing on room air. Abdomen: Soft. Normal bowel sounds. Nontender.  PFT 10/09/16:FVC 3.56 L (72%) FEV1 1.55 L (41%) FEV1/FVC 0.43 FEF 25-75 0.50 L (15%) positive bronchodilator response TLC 7.39 L (105%) RV 166% ERV 114% DLCO uncorrected 36%  6MWT 10/09/16:  Walked 444 meters / Baseline Sat 97% on RA / Nadir Sat 98% on  RA  IMAGING CXR PA/LAT 05/12/13 (previously reviewed by me): Hyperinflation with flattening of the diaphragms and deep sulci bilaterally. No nodule or opacity appreciated. No pleural effusion. Heart normal in size & mediastinum normal in contour. Trace amount of fluid within right fissure.  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 Mild to moderate sized apical infarct with mild peri-infarct ischemia. Small amount of myocardium currently a chair pretty. EF >65%. No ST segment deviation during stress test.   LABS 10/09/16 Alpha-1 antitrypsin: MM (215)  01/21/09 UDS:  Benzos & Opiates Positive  8/27/8 UDS:  Benzos, Opiates, & THC Positive    Assessment & Plan:  56 y.o. with Severe COPD and tobacco use disorder. Patient seems to have symptoms of acute bronchitis with his increased sputum production. With his increased shortness of breath I feel that antibiotic therapy is reasonable. In the absence of wheezing on physical exam I do not feel that steroid therapy is necessary. I did counsel the patient for over 3 minutes on the need for complete tobacco cessation. I instructed him to contact my office if he had any new breathing problems or clinical worsening before his next appointment.  1. Acute Bronchitis: Checking chest x-ray PA/LAT today. Checking sputum culture for AFB, fungus, and bacteria. Empiric treatment with Levaquin 500 mg by mouth daily 5 days. Utilizing Mucinex twice daily as a mucolytic. 2. Severe  COPD:  Continuing current inhaler regimen of Symbicort and Ventolin. Plan for spirometry with bronchodilator challenge at next appointment. 3. Tobacco Use Disorder: Patient counseled for over 3 minutes and need for complete tobacco cessation. Recommended nicotine replacement with gum in addition to patches for intermittent cravings. 4. Health Maintenance:  S/P Influenza Vaccine October 2017 & Pneumovax 15 October 2016.  5. Follow-up:  Follow-up appointment in February as scheduled.  Donna Christen Jamison Neighbor, M.D. [facility] Pager:  [phone] After 3pm or if no response, call [phone] 2:08 PM 11/27/16

## 2016-11-27 NOTE — Patient Instructions (Signed)
   Continue using your inhalers as prescribed.  Call me if your cough or breathing gets any worse.   Try using Nicotine Gum with your patch to help your intermittent cravings.  Try using Mucinex 600mg  twice daily for the next few days to help with your mucus and drink plenty of liquids/water.   We will keep your appointment in February as it is scheduled right now.  TESTS ORDERED: 1. Sputum Ctx AFB, Fungus, & Bacteria 2. CXR PA/LAT today

## 2016-11-30 LAB — RESPIRATORY CULTURE OR RESPIRATORY AND SPUTUM CULTURE: Organism ID, Bacteria: NORMAL

## 2016-12-29 LAB — FUNGUS CULTURE W SMEAR

## 2017-01-06 ENCOUNTER — Encounter (HOSPITAL_COMMUNITY): Payer: Self-pay | Admitting: Psychiatry

## 2017-01-06 ENCOUNTER — Ambulatory Visit (INDEPENDENT_AMBULATORY_CARE_PROVIDER_SITE_OTHER): Payer: Medicare HMO | Admitting: Psychiatry

## 2017-01-06 VITALS — BP 112/72 | HR 78 | Ht 70.0 in | Wt 150.0 lb

## 2017-01-06 DIAGNOSIS — Z9889 Other specified postprocedural states: Secondary | ICD-10-CM | POA: Diagnosis not present

## 2017-01-06 DIAGNOSIS — Z8249 Family history of ischemic heart disease and other diseases of the circulatory system: Secondary | ICD-10-CM | POA: Diagnosis not present

## 2017-01-06 DIAGNOSIS — F332 Major depressive disorder, recurrent severe without psychotic features: Secondary | ICD-10-CM | POA: Diagnosis not present

## 2017-01-06 DIAGNOSIS — Z79899 Other long term (current) drug therapy: Secondary | ICD-10-CM

## 2017-01-06 DIAGNOSIS — Z7982 Long term (current) use of aspirin: Secondary | ICD-10-CM

## 2017-01-06 DIAGNOSIS — F17210 Nicotine dependence, cigarettes, uncomplicated: Secondary | ICD-10-CM

## 2017-01-06 DIAGNOSIS — Z803 Family history of malignant neoplasm of breast: Secondary | ICD-10-CM | POA: Diagnosis not present

## 2017-01-06 DIAGNOSIS — Z818 Family history of other mental and behavioral disorders: Secondary | ICD-10-CM

## 2017-01-06 DIAGNOSIS — Z825 Family history of asthma and other chronic lower respiratory diseases: Secondary | ICD-10-CM

## 2017-01-06 DIAGNOSIS — Z888 Allergy status to other drugs, medicaments and biological substances status: Secondary | ICD-10-CM

## 2017-01-06 MED ORDER — DIVALPROEX SODIUM 250 MG PO DR TAB: 750.0000 mg | DELAYED_RELEASE_TABLET | Freq: Every day | ORAL | 2 refills | Status: DC

## 2017-01-06 MED ORDER — CLONAZEPAM 0.5 MG PO TABS: 0.5000 mg | ORAL_TABLET | Freq: Two times a day (BID) | ORAL | 2 refills | Status: DC

## 2017-01-06 MED ORDER — TEMAZEPAM 30 MG PO CAPS: 30.0000 mg | ORAL_CAPSULE | Freq: Every evening | ORAL | 2 refills | Status: DC | PRN

## 2017-01-06 MED ORDER — FLUOXETINE HCL 40 MG PO CAPS: 40.0000 mg | ORAL_CAPSULE | ORAL | 2 refills | Status: DC

## 2017-01-06 MED ORDER — OLANZAPINE 5 MG PO TABS: 5.0000 mg | ORAL_TABLET | Freq: Every day | ORAL | 2 refills | Status: DC
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
## 2017-01-13 ENCOUNTER — Other Ambulatory Visit: Payer: Self-pay | Admitting: Pulmonary Disease

## 2017-01-13 DIAGNOSIS — R0600 Dyspnea, unspecified: Secondary | ICD-10-CM

## 2017-01-14 ENCOUNTER — Telehealth: Payer: Self-pay | Admitting: Pulmonary Disease

## 2017-01-14 ENCOUNTER — Ambulatory Visit: Payer: Self-pay | Admitting: Pulmonary Disease

## 2017-01-14 LAB — AFB CULTURE WITH SMEAR (NOT AT ARMC)

## 2017-01-14 NOTE — Telephone Encounter (Signed)
IMAGING CXR PA/LAT 11/27/16 (personally reviewed by me): No pleural effusion. No parenchymal mass or opacification. Small amount of fluid within right fissure. Hyperinflation with flattening of the diaphragms. Heart normal in size & mediastinum normal in contour.  MICROBIOLOGY Sputum Culture 11/27/16:  Normal Oral Flora / AFB Pending / Fungus Pending

## 2017-02-02 ENCOUNTER — Encounter: Payer: Self-pay | Admitting: Pulmonary Disease

## 2017-02-02 ENCOUNTER — Ambulatory Visit (INDEPENDENT_AMBULATORY_CARE_PROVIDER_SITE_OTHER): Payer: Medicare HMO | Admitting: Pulmonary Disease

## 2017-02-02 VITALS — BP 92/60 | HR 70 | Ht 70.0 in | Wt 150.4 lb

## 2017-02-02 DIAGNOSIS — J449 Chronic obstructive pulmonary disease, unspecified: Secondary | ICD-10-CM

## 2017-02-02 DIAGNOSIS — F17200 Nicotine dependence, unspecified, uncomplicated: Secondary | ICD-10-CM | POA: Diagnosis not present

## 2017-02-02 DIAGNOSIS — R0600 Dyspnea, unspecified: Secondary | ICD-10-CM

## 2017-02-02 LAB — PULMONARY FUNCTION TEST
FEF 25-75 Post: 0.98 L/sec
FEF 25-75 Pre: 0.75 L/sec
FEF2575-%Change-Post: 30 %
FEF2575-%Pred-Post: 32 %
FEF2575-%Pred-Pre: 24 %
FEV1-%Change-Post: 10 %
FEV1-%Pred-Post: 55 %
FEV1-%Pred-Pre: 50 %
FEV1-Post: 2 L
FEV1-Pre: 1.8 L
FEV1FVC-%Change-Post: 4 %
FEV1FVC-%Pred-Pre: 68 %
FEV6-%Change-Post: 7 %
FEV6-%Pred-Post: 79 %
FEV6-%Pred-Pre: 74 %
FEV6-Post: 3.6 L
FEV6-Pre: 3.36 L
FEV6FVC-%Change-Post: 0 %
FEV6FVC-%Pred-Post: 101 %
FEV6FVC-%Pred-Pre: 100 %
FVC-%Change-Post: 6 %
FVC-%Pred-Post: 78 %
FVC-%Pred-Pre: 73 %
FVC-Post: 3.69 L
FVC-Pre: 3.48 L
Post FEV1/FVC ratio: 54 %
Post FEV6/FVC ratio: 97 %
Pre FEV1/FVC ratio: 52 %
Pre FEV6/FVC Ratio: 97 %

## 2017-02-02 MED ORDER — ALBUTEROL SULFATE (2.5 MG/3ML) 0.083% IN NEBU
2.5000 mg | INHALATION_SOLUTION | RESPIRATORY_TRACT | 3 refills | Status: DC | PRN
Start: 2017-02-02 — End: 2019-01-13

## 2017-02-02 MED ORDER — ALBUTEROL SULFATE HFA 108 (90 BASE) MCG/ACT IN AERS: 2.0000 | INHALATION_SPRAY | RESPIRATORY_TRACT | 3 refills | Status: DC | PRN

## 2017-02-02 NOTE — Patient Instructions (Signed)
   Continue using your inhalers as prescribed.  I'm send in a prescription for the ProAir inhaler to replace your Ventolin inhaler.   I'm also sending in a prescription for a home nebulizer and Albuterol to use in the nebulizer to help when you are short of breath. This can be used in place of your ProAir/Ventolin inhaler if you are at home.  I'm also referring you to the [facility]. This should help your endurance.  Keep working on your smoking.  I will see you back in 3 months or sooner if needed.
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
## 2017-02-02 NOTE — Addendum Note (Signed)
Addended by: Pamalee LeydenWIGGINS, Tierria Watson J on: 02/02/2017 03:21 PM   Modules accepted: Orders

## 2017-02-02 NOTE — Progress Notes (Signed)
PFT done today 02/02/17 (pre and post spirometry).

## 2017-02-07 ENCOUNTER — Inpatient Hospital Stay (HOSPITAL_COMMUNITY)
Admission: EM | Admit: 2017-02-07 | Discharge: 2017-02-10 | DRG: 378 | Disposition: A | Discharge status: Home / Self Care | Payer: Medicare HMO | Attending: Internal Medicine | Admitting: Internal Medicine

## 2017-02-07 ENCOUNTER — Emergency Department (HOSPITAL_COMMUNITY): Payer: Medicare HMO

## 2017-02-07 ENCOUNTER — Encounter (HOSPITAL_COMMUNITY): Payer: Self-pay | Admitting: Emergency Medicine

## 2017-02-07 DIAGNOSIS — K2980 Duodenitis without bleeding: Secondary | ICD-10-CM | POA: Diagnosis present

## 2017-02-07 DIAGNOSIS — I959 Hypotension, unspecified: Secondary | ICD-10-CM | POA: Diagnosis present

## 2017-02-07 DIAGNOSIS — Z8249 Family history of ischemic heart disease and other diseases of the circulatory system: Secondary | ICD-10-CM

## 2017-02-07 DIAGNOSIS — R410 Disorientation, unspecified: Secondary | ICD-10-CM | POA: Diagnosis present

## 2017-02-07 DIAGNOSIS — K921 Melena: Secondary | ICD-10-CM | POA: Diagnosis present

## 2017-02-07 DIAGNOSIS — Z825 Family history of asthma and other chronic lower respiratory diseases: Secondary | ICD-10-CM

## 2017-02-07 DIAGNOSIS — K222 Esophageal obstruction: Secondary | ICD-10-CM | POA: Diagnosis present

## 2017-02-07 DIAGNOSIS — J449 Chronic obstructive pulmonary disease, unspecified: Secondary | ICD-10-CM | POA: Diagnosis present

## 2017-02-07 DIAGNOSIS — E785 Hyperlipidemia, unspecified: Secondary | ICD-10-CM | POA: Diagnosis present

## 2017-02-07 DIAGNOSIS — T39395A Adverse effect of other nonsteroidal anti-inflammatory drugs [NSAID], initial encounter: Secondary | ICD-10-CM | POA: Diagnosis present

## 2017-02-07 DIAGNOSIS — R531 Weakness: Secondary | ICD-10-CM | POA: Diagnosis not present

## 2017-02-07 DIAGNOSIS — Z7951 Long term (current) use of inhaled steroids: Secondary | ICD-10-CM

## 2017-02-07 DIAGNOSIS — D649 Anemia, unspecified: Secondary | ICD-10-CM

## 2017-02-07 DIAGNOSIS — F17210 Nicotine dependence, cigarettes, uncomplicated: Secondary | ICD-10-CM | POA: Diagnosis present

## 2017-02-07 DIAGNOSIS — K319 Disease of stomach and duodenum, unspecified: Secondary | ICD-10-CM | POA: Diagnosis present

## 2017-02-07 DIAGNOSIS — Z7982 Long term (current) use of aspirin: Secondary | ICD-10-CM

## 2017-02-07 DIAGNOSIS — K922 Gastrointestinal hemorrhage, unspecified: Secondary | ICD-10-CM | POA: Diagnosis not present

## 2017-02-07 DIAGNOSIS — Z79899 Other long term (current) drug therapy: Secondary | ICD-10-CM

## 2017-02-07 DIAGNOSIS — K279 Peptic ulcer, site unspecified, unspecified as acute or chronic, without hemorrhage or perforation: Secondary | ICD-10-CM | POA: Diagnosis present

## 2017-02-07 DIAGNOSIS — I739 Peripheral vascular disease, unspecified: Secondary | ICD-10-CM | POA: Diagnosis present

## 2017-02-07 DIAGNOSIS — R42 Dizziness and giddiness: Secondary | ICD-10-CM

## 2017-02-07 DIAGNOSIS — Z803 Family history of malignant neoplasm of breast: Secondary | ICD-10-CM

## 2017-02-07 DIAGNOSIS — K264 Chronic or unspecified duodenal ulcer with hemorrhage: Secondary | ICD-10-CM | POA: Diagnosis not present

## 2017-02-07 DIAGNOSIS — F319 Bipolar disorder, unspecified: Secondary | ICD-10-CM | POA: Diagnosis present

## 2017-02-07 DIAGNOSIS — D62 Acute posthemorrhagic anemia: Secondary | ICD-10-CM | POA: Diagnosis present

## 2017-02-07 DIAGNOSIS — K3189 Other diseases of stomach and duodenum: Secondary | ICD-10-CM | POA: Diagnosis not present

## 2017-02-07 HISTORY — DX: Acute posthemorrhagic anemia: D62

## 2017-02-07 LAB — CBC WITH DIFFERENTIAL/PLATELET
Basophils Absolute: 0 10*3/uL (ref 0.0–0.1)
Basophils Relative: 1 %
Eosinophils Absolute: 0.1 10*3/uL (ref 0.0–0.7)
Eosinophils Relative: 3 %
HCT: 25.7 % — ABNORMAL LOW (ref 39.0–52.0)
Hemoglobin: 7.6 g/dL — ABNORMAL LOW (ref 13.0–17.0)
Lymphocytes Relative: 29 %
Lymphs Abs: 1.4 10*3/uL (ref 0.7–4.0)
MCH: 18.5 pg — ABNORMAL LOW (ref 26.0–34.0)
MCHC: 29.6 g/dL — ABNORMAL LOW (ref 30.0–36.0)
MCV: 62.5 fL — ABNORMAL LOW (ref 78.0–100.0)
Monocytes Absolute: 0.4 10*3/uL (ref 0.1–1.0)
Monocytes Relative: 8 %
Neutro Abs: 2.9 10*3/uL (ref 1.7–7.7)
Neutrophils Relative %: 60 %
Platelets: 334 10*3/uL (ref 150–400)
RBC: 4.11 MIL/uL — ABNORMAL LOW (ref 4.22–5.81)
RDW: 16.7 % — ABNORMAL HIGH (ref 11.5–15.5)
WBC: 4.8 10*3/uL (ref 4.0–10.5)

## 2017-02-07 LAB — BASIC METABOLIC PANEL
Anion gap: 5 (ref 5–15)
BUN: 8 mg/dL (ref 6–20)
CO2: 27 mmol/L (ref 22–32)
Calcium: 8.4 mg/dL — ABNORMAL LOW (ref 8.9–10.3)
Chloride: 104 mmol/L (ref 101–111)
Creatinine, Ser: 0.96 mg/dL (ref 0.61–1.24)
GFR calc Af Amer: >60 mL/min (ref >60)
GFR calc non Af Amer: >60 mL/min (ref >60)
Glucose, Bld: 96 mg/dL (ref 65–99)
Potassium: 3.7 mmol/L (ref 3.5–5.1)
Sodium: 136 mmol/L (ref 135–145)

## 2017-02-07 LAB — CBG MONITORING, ED: Glucose-Capillary: 92 mg/dL (ref 65–99)

## 2017-02-07 LAB — I-STAT CG4 LACTIC ACID, ED: Lactic Acid, Venous: 0.94 mmol/L (ref 0.5–1.9)

## 2017-02-07 LAB — TROPONIN I: Troponin I: <0.03 ng/mL (ref <0.03)

## 2017-02-07 MED ORDER — ALBUTEROL SULFATE (2.5 MG/3ML) 0.083% IN NEBU: 3.0000 mL | INHALATION_SOLUTION | RESPIRATORY_TRACT | Status: DC | PRN

## 2017-02-07 MED ORDER — PANTOPRAZOLE SODIUM 40 MG IV SOLR
40.0000 mg | Freq: Two times a day (BID) | INTRAVENOUS | Status: DC
  Administered 2017-02-08 (×3): 40 mg via INTRAVENOUS
  Filled 2017-02-07 (×3): qty 40

## 2017-02-07 MED ORDER — SODIUM CHLORIDE 0.9 % IV SOLN
INTRAVENOUS | Status: AC
  Administered 2017-02-08: 01:00:00 via INTRAVENOUS

## 2017-02-07 MED ORDER — CLONAZEPAM 0.5 MG PO TABS
0.5000 mg | ORAL_TABLET | Freq: Two times a day (BID) | ORAL | Status: DC
  Administered 2017-02-08 – 2017-02-10 (×6): 0.5 mg via ORAL
  Filled 2017-02-07 (×5): qty 1

## 2017-02-07 MED ORDER — IPRATROPIUM-ALBUTEROL 0.5-2.5 (3) MG/3ML IN SOLN
3.0000 mL | Freq: Once | RESPIRATORY_TRACT | Status: AC
  Administered 2017-02-07: 3 mL via RESPIRATORY_TRACT
  Filled 2017-02-07: qty 3

## 2017-02-07 MED ORDER — ONDANSETRON HCL 4 MG PO TABS: 4.0000 mg | ORAL_TABLET | Freq: Four times a day (QID) | ORAL | Status: DC | PRN

## 2017-02-07 MED ORDER — TIOTROPIUM BROMIDE MONOHYDRATE 18 MCG IN CAPS
1.0000 | ORAL_CAPSULE | Freq: Every day | RESPIRATORY_TRACT | Status: DC
  Administered 2017-02-08 – 2017-02-10 (×3): 18 ug via RESPIRATORY_TRACT
  Filled 2017-02-07: qty 5

## 2017-02-07 MED ORDER — ONDANSETRON HCL 4 MG/2ML IJ SOLN: 4.0000 mg | Freq: Four times a day (QID) | INTRAMUSCULAR | Status: DC | PRN

## 2017-02-07 MED ORDER — ALBUTEROL SULFATE (2.5 MG/3ML) 0.083% IN NEBU
INHALATION_SOLUTION | RESPIRATORY_TRACT | Status: AC
  Administered 2017-02-07: 2.5 mg
  Filled 2017-02-07: qty 3

## 2017-02-07 MED ORDER — ALBUTEROL SULFATE (2.5 MG/3ML) 0.083% IN NEBU
2.5000 mg | INHALATION_SOLUTION | RESPIRATORY_TRACT | Status: DC | PRN
  Administered 2017-02-08: 2.5 mg via RESPIRATORY_TRACT
  Filled 2017-02-07: qty 3

## 2017-02-07 MED ORDER — SODIUM CHLORIDE 0.9 % IV SOLN
10.0000 mL/h | Freq: Once | INTRAVENOUS | Status: AC
  Administered 2017-02-07: 10 mL/h via INTRAVENOUS

## 2017-02-07 MED ORDER — SODIUM CHLORIDE 0.9 % IV BOLUS (SEPSIS)
500.0000 mL | Freq: Once | INTRAVENOUS | Status: AC
  Administered 2017-02-07: 500 mL via INTRAVENOUS

## 2017-02-07 MED ORDER — DIVALPROEX SODIUM 250 MG PO DR TAB
750.0000 mg | DELAYED_RELEASE_TABLET | Freq: Every day | ORAL | Status: DC
  Administered 2017-02-08 – 2017-02-09 (×3): 750 mg via ORAL
  Filled 2017-02-07 (×3): qty 3

## 2017-02-07 NOTE — ED Notes (Signed)
Family at bedside.  Daughter informed me that pt was altered yesterday with slurred speech "not making a lot of sense when talking." Daughter saw him again at 1500 today and speech had improved but was still slurred.

## 2017-02-07 NOTE — ED Triage Notes (Addendum)
Per EMS: Pt had weakness starting at 1500, stroke scale negative with ems.  Family reported hx of tia, but pt denies.  Pt cardiologist informed pt that he has had "light heart attack" in past.  Pt alert and oriented at this time. Pt also has chest tightness with deep breath and with palpation.  cbg 129, vitals wnl.

## 2017-02-07 NOTE — ED Provider Notes (Signed)
[facility] Provider Note   CSN: [id number] Arrival date & time: 02/07/17  1733     History   Chief Complaint Chief Complaint  Patient presents with  . Weakness    HPI Jeffrey [name] is a 56 y.o. male.  HPI patient presents with shortness of breath. States he has had more trouble breathing recently and a cough. History of COPD. Also reported dizziness today. States he felt lightheaded. States it felt like his to pass out he states he had to sit down. Denies headache. Reportedly had some difficulty speaking with it. No fevers. No chest pain. No localizing numbness or weakness. He has had cough with some white sputum. States he also has pain in his right flank to lower back. States it goes down his leg. States it feels like sciatica.  Past Medical History:  Diagnosis Date  . Anxiety   . Arthritis    deg disease, bulging disk,  shoulder level  . Bipolar disorder (HCC)   . 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 Depression    anxiety  . Hyperlipidemia   . Peptic ulcer disease    Review  . Pneumonia   . [name] (peripheral vascular disease) (HCC) 06/18/2015  . Shortness of breath     Patient Active Problem List   Diagnosis Date Noted  . 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 Dyspnea 08/26/2016  . H/O: depression 08/26/2016  . Dyslipidemia 08/26/2016  . Tobacco use disorder 08/26/2016  . [name] (peripheral vascular disease) (HCC) 06/18/2015  . Chronic diarrhea 05/02/2013  . Hematochezia 05/02/2013  . Abnormal weight loss 05/02/2013  . Abdominal pain, periumbilical 05/02/2013    Past Surgical History:  Procedure Laterality Date  . COLONOSCOPY  03/14/2007   [id number] colonoscopy and terminal ileoscopy except external hemorrhoids  . COLONOSCOPY N/A 05/05/2013   Procedure: COLONOSCOPY;  Surgeon: [name], MD;  Location: [facility];  Service: Endoscopy;  Laterality: N/A;  1:30  . ESOPHAGOGASTRODUODENOSCOPY  03/14/2007   [id number] antral gastritis with bulbar  duodenitis/paucity to postbulbar duodenal folds and biopsy were benign with no evidence of villous atrophy.  Marland Kitchen HAND SURGERY     left, secondary to self-inflicted laceration  . SHOULDER SURGERY     right  . TOE SURGERY     left great toe , amputated-lawnmover accident       Home Medications    Prior to Admission medications   Medication Sig Start Date End Date Taking? Authorizing Provider  albuterol (PROAIR HFA) 108 (90 Base) MCG/ACT inhaler Inhale 2 puffs into the lungs every 4 (four) hours as needed for wheezing or shortness of breath. 02/02/17  Yes Roslynn Amble, MD  albuterol (PROVENTIL) (2.5 MG/3ML) 0.083% nebulizer solution Take 3 mLs (2.5 mg total) by nebulization every 4 (four) hours as needed for shortness of breath. 02/02/17  Yes Roslynn Amble, MD  aspirin EC 81 MG tablet Take 1 tablet (81 mg total) by mouth daily. Patient taking differently: Take 81 mg by mouth at bedtime.  10/08/16  Yes Laqueta Linden, MD  atorvastatin (LIPITOR) 20 MG tablet Take 20 mg by mouth daily.    Yes Historical Provider, MD  budesonide-formoterol (SYMBICORT) 160-4.5 MCG/ACT inhaler Inhale 2 puffs into the lungs 2 (two) times daily. 08/26/16  Yes Roslynn Amble, MD  clonazePAM (KLONOPIN) 0.5 MG tablet Take 1 tablet (0.5 mg total) by mouth 2 (two) times daily. 01/06/17 01/06/18 Yes Myrlene Broker, MD  divalproex (DEPAKOTE) 250 MG DR tablet Take 3 tablets (  750 mg total) by mouth at bedtime. 01/06/17  Yes Myrlene Broker, MD  fenofibrate micronized (LOFIBRA) 134 MG capsule Take 134 mg by mouth daily before breakfast.    Yes Historical Provider, MD  FLUoxetine (PROZAC) 40 MG capsule Take 1 capsule (40 mg total) by mouth every morning. 01/06/17  Yes Myrlene Broker, MD  metoprolol (LOPRESSOR) 50 MG tablet Take 50 mg by mouth 2 (two) times daily.   Yes Historical Provider, MD  OLANZapine (ZYPREXA) 5 MG tablet Take 1 tablet (5 mg total) by mouth at bedtime. 01/06/17 01/06/18 Yes Myrlene Broker, MD    temazepam (RESTORIL) 30 MG capsule Take 1 capsule (30 mg total) by mouth at bedtime as needed for sleep. 01/06/17  Yes Myrlene Broker, MD  Tiotropium Bromide Monohydrate (SPIRIVA RESPIMAT) 2.5 MCG/ACT AERS Inhale 2 puffs into the lungs daily. 10/09/16  Yes Roslynn Amble, MD    Family History Family History  Problem Relation Age of Onset  . Breast cancer Mother     deceased  . Heart disease Father   . Depression Daughter   . Anxiety disorder Daughter   . Anxiety disorder Son   . Depression Son   . Asthma Brother   . Heart attack Maternal Aunt   . Heart attack Maternal Uncle   . Heart attack Paternal Aunt   . Heart attack Paternal Uncle   . Heart attack Maternal Grandmother   . Heart attack Maternal Grandfather   . Emphysema Maternal Grandfather   . Heart attack Paternal Grandmother   . Heart attack Paternal Grandfather   . Colon cancer Neg Hx   . Liver disease Neg Hx     Social History Social History  Substance Use Topics  . Smoking status: Current Every Day Smoker    Packs/day: 1.50    Years: 40.00    Types: Cigarettes    Start date: 08/04/1971  . Smokeless tobacco: Never Used     Comment: peak rate of 2.5ppd, 1/2ppd on 11/27/2016  . Alcohol use No     Comment: last used 2 nights ago, smokes a joint about every 3 days     Allergies   Augmentin [amoxicillin-pot clavulanate] and Ace inhibitors   Review of Systems Review of Systems  Constitutional: Negative for appetite change, fatigue and fever.  HENT: Negative for congestion.   Eyes: Negative for photophobia.  Respiratory: Positive for cough and shortness of breath.   Gastrointestinal: Positive for anal bleeding. Negative for abdominal distention.  Endocrine: Negative for polyuria.  Genitourinary: Negative for dysuria.  Musculoskeletal: Negative for back pain.  Neurological: Positive for speech difficulty and light-headedness.  Psychiatric/Behavioral: Negative for confusion.     Physical Exam Updated  Vital Signs BP 112/71   Pulse 77   Temp 98.4 F (36.9 C)   Resp (!) 29   Ht 5\' 10"  (1.778 m)   Wt 150 lb (68 kg)   SpO2 94%   BMI 21.52 kg/m   Physical Exam  Constitutional: He is oriented to person, place, and time. He appears well-developed.  HENT:  Head: Atraumatic.  Eyes: Pupils are equal, round, and reactive to light.  Neck: Neck supple.  Cardiovascular: Normal rate.   Pulmonary/Chest:  Diffuse harsh breath sounds, worse at the bases. Also some underlying wheezes.  Abdominal: Soft. There is no tenderness.  Musculoskeletal: He exhibits no edema.  Neurological: He is alert and oriented to person, place, and time.  Eye movements intact. Pupils reactive. Face symmetric. Normal speech. Good  grip strength bilaterally. Good straight leg raise bilaterally.  Skin: Skin is warm. Capillary refill takes less than 2 seconds. There is pallor.     ED Treatments / Results  Labs (all labs ordered are listed, but only abnormal results are displayed) Labs Reviewed  BASIC METABOLIC PANEL - Abnormal; Notable for the following:       Result Value   Calcium 8.4 (*)    All other components within normal limits  CBC WITH DIFFERENTIAL/PLATELET - Abnormal; Notable for the following:    RBC 4.11 (*)    Hemoglobin 7.6 (*)    HCT 25.7 (*)    MCV 62.5 (*)    MCH 18.5 (*)    MCHC 29.6 (*)    RDW 16.7 (*)    All other components within normal limits  TROPONIN I  I-STAT CG4 LACTIC ACID, ED  CBG MONITORING, ED  POC OCCULT BLOOD, ED    EKG  EKG Interpretation  Date/Time:  Sunday February 07 2017 17:41:56 EDT Ventricular Rate:  65 PR Interval:    QRS Duration: 90 QT Interval:  417 QTC Calculation: 434 R Axis:   80 Text Interpretation:  Sinus rhythm Confirmed by Rubin Payor  MD, Harrold Donath [phone]) on 02/07/2017 5:46:17 PM       Radiology Dg Chest 2 View  Result Date: 02/07/2017 CLINICAL DATA:  57 year old male with weakness. EXAM: CHEST  2 VIEW COMPARISON:  Stop chest radiograph dated  11/27/2016 FINDINGS: Mild underlying COPD with a trapping and hyperexpansion of the lungs and flattening of the diaphragms. Bilateral perihilar vascular prominence and hazy densities noted which are more prominent compared to the prior radiograph and may represent mild vascular congestion. Superimposed pneumonia is not excluded. There is no pleural effusion or pneumothorax. The cardiac silhouette is within normal limits. No acute osseous pathology identified. IMPRESSION: Perihilar hazy densities likely mild vascular congestion. Pneumonia is not excluded. Clinical correlation and follow-up recommended. Electronically Signed   By: Elgie Collard M.D.   On: 02/07/2017 18:42   Ct Head Wo Contrast  Result Date: 02/07/2017 CLINICAL DATA:  56 year old male with difficulty speaking. EXAM: CT HEAD WITHOUT CONTRAST TECHNIQUE: Contiguous axial images were obtained from the base of the skull through the vertex without intravenous contrast. COMPARISON:  None FINDINGS: Brain: The ventricles and sulci are appropriate in size for patient's age. The gray-white matter discrimination is preserved. There is no acute intracranial hemorrhage. No mass effect or midline shift noted. No intra-axial fluid collection. Vascular: No hyperdense vessel or unexpected calcification. Skull: Normal. Negative for fracture or focal lesion. Sinuses/Orbits: No acute finding. Other: None IMPRESSION: No acute intracranial pathology. If symptoms persist and there are no contraindications, MRI may provide better evaluation if clinically indicated. Electronically Signed   By: Elgie Collard M.D.   On: 02/07/2017 18:48    Procedures Procedures (including critical care time)  Medications Ordered in ED Medications  sodium chloride 0.9 % bolus 500 mL (0 mLs Intravenous Stopped 02/07/17 1845)     Initial Impression / Assessment and Plan / ED Course  I have reviewed the triage vital signs and the nursing notes.  Pertinent labs & imaging  results that were available during my care of the patient were reviewed by me and considered in my medical decision making (see chart for details).     Patient presents with lightheadedness. Some difficulty speaking at times. Initially mildly hypotensive. Patient is pale. Discussed with patient further after labs returned and says he has had some blood in the stool  and black stools. States he's had this before. States his been going on for a while now. Refuses rectal exam. States he has seen a gastro[place] before in [place] but does not know who it was. I think his difficulty speaking was likely due to his hypotension and less likely CVA or TIA. He does however have harsh breath sounds and some abnormal findings on x-ray. No fevers but only to be followed for potential pneumonia or CHF. Will admit to internal medicine.  Final Clinical Impressions(s) / ED Diagnoses   Final diagnoses:  Anemia, unspecified type  Gastrointestinal hemorrhage, unspecified gastrointestinal hemorrhage type    New Prescriptions New Prescriptions   No medications on file     Benjiman CoreNathan Lou Irigoyen, MD 02/07/17 2150

## 2017-02-07 NOTE — ED Notes (Signed)
Called lab to collect blood on pt.

## 2017-02-07 NOTE — H&P (Signed)
History and Physical    Jeffrey [name] EXB:[id number] DOB: [birth year] DOA: 02/07/2017  PCP: Pamelia Hoit, MD  Patient coming from:  home  Chief Complaint:   weak  HPI: Jeffrey [name] is a 56 y.o. male with medical history significant of bipolar, copd comes in with weeks of sob and weakness, to the point he has no energy to walk much.  Denies chest pain.  No fevers.  Reports he has been having both dark stools and sometimes mixed bright red for months maybe years.  He denies any vomiting.  No etoh use.  Denies any abdominal pain ever.  Reports he has hemorrhoids and thought this was all due to that.  Upon arrival to room, pt has defecated all over the floor, as he could not get help to go to the bathroom.  It is brown with no evidence of blood.   Review of Systems: As per HPI otherwise 10 point review of systems negative.   Past Medical History:  Diagnosis Date  . Acute blood loss anemia 02/07/2017  . Anxiety   . Arthritis    deg disease, bulging disk,  shoulder level  . Bipolar disorder (HCC)   . 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 Depression    anxiety  . Hyperlipidemia   . Peptic ulcer disease    Review  . Pneumonia   . [name] (peripheral vascular disease) (HCC) 06/18/2015  . Shortness of breath     Past Surgical History:  Procedure Laterality Date  . COLONOSCOPY  03/14/2007   [id number] colonoscopy and terminal ileoscopy except external hemorrhoids  . COLONOSCOPY N/A 05/05/2013   Procedure: COLONOSCOPY;  Surgeon: [name], MD;  Location: [facility];  Service: Endoscopy;  Laterality: N/A;  1:30  . ESOPHAGOGASTRODUODENOSCOPY  03/14/2007   [id number] antral gastritis with bulbar duodenitis/paucity to postbulbar duodenal folds and biopsy were benign with no evidence of villous atrophy.  Marland Kitchen HAND SURGERY     left, secondary to self-inflicted laceration  . SHOULDER SURGERY     right  . TOE SURGERY     left great toe , amputated-lawnmover accident     reports that  he has been smoking Cigarettes.  He started smoking about 45 years ago. He has a 60.00 pack-year smoking history. He has never used smokeless tobacco. He reports that he uses drugs, including Marijuana. He reports that he does not drink alcohol.  Allergies  Allergen Reactions  . Augmentin [Amoxicillin-Pot Clavulanate] Rash and Other (See Comments)    Has patient had a PCN reaction causing immediate rash, facial/tongue/throat swelling, SOB or lightheadedness with hypotension: No Has patient had a PCN reaction causing severe rash involving mucus membranes or skin necrosis: No Has patient had a PCN reaction that required hospitalization No Has patient had a PCN reaction occurring within the last 10 years: Yes If all of the above answers are "NO", then may proceed with Cephalosporin use.  Donivan Scull Inhibitors Hives    Family History  Problem Relation Age of Onset  . Breast cancer Mother     deceased  . Heart disease Father   . Depression Daughter   . Anxiety disorder Daughter   . Anxiety disorder Son   . Depression Son   . Asthma Brother   . Heart attack Maternal Aunt   . Heart attack Maternal Uncle   . Heart attack Paternal Aunt   . Heart attack Paternal Uncle   . Heart attack Maternal Grandmother   . Heart attack Maternal  Grandfather   . Emphysema Maternal Grandfather   . Heart attack Paternal Grandmother   . Heart attack Paternal Grandfather   . Colon cancer Neg Hx   . Liver disease Neg Hx     Prior to Admission medications   Medication Sig Start Date End Date Taking? Authorizing Provider  albuterol (PROAIR HFA) 108 (90 Base) MCG/ACT inhaler Inhale 2 puffs into the lungs every 4 (four) hours as needed for wheezing or shortness of breath. 02/02/17  Yes Roslynn Amble, MD  albuterol (PROVENTIL) (2.5 MG/3ML) 0.083% nebulizer solution Take 3 mLs (2.5 mg total) by nebulization every 4 (four) hours as needed for shortness of breath. 02/02/17  Yes Roslynn Amble, MD  aspirin EC 81 MG  tablet Take 1 tablet (81 mg total) by mouth daily. Patient taking differently: Take 81 mg by mouth at bedtime.  10/08/16  Yes Laqueta Linden, MD  atorvastatin (LIPITOR) 20 MG tablet Take 20 mg by mouth daily.    Yes Historical Provider, MD  budesonide-formoterol (SYMBICORT) 160-4.5 MCG/ACT inhaler Inhale 2 puffs into the lungs 2 (two) times daily. 08/26/16  Yes Roslynn Amble, MD  clonazePAM (KLONOPIN) 0.5 MG tablet Take 1 tablet (0.5 mg total) by mouth 2 (two) times daily. 01/06/17 01/06/18 Yes Myrlene Broker, MD  divalproex (DEPAKOTE) 250 MG DR tablet Take 3 tablets (750 mg total) by mouth at bedtime. 01/06/17  Yes Myrlene Broker, MD  fenofibrate micronized (LOFIBRA) 134 MG capsule Take 134 mg by mouth daily before breakfast.    Yes Historical Provider, MD  FLUoxetine (PROZAC) 40 MG capsule Take 1 capsule (40 mg total) by mouth every morning. 01/06/17  Yes Myrlene Broker, MD  metoprolol (LOPRESSOR) 50 MG tablet Take 50 mg by mouth 2 (two) times daily.   Yes Historical Provider, MD  OLANZapine (ZYPREXA) 5 MG tablet Take 1 tablet (5 mg total) by mouth at bedtime. 01/06/17 01/06/18 Yes Myrlene Broker, MD  temazepam (RESTORIL) 30 MG capsule Take 1 capsule (30 mg total) by mouth at bedtime as needed for sleep. 01/06/17  Yes Myrlene Broker, MD  Tiotropium Bromide Monohydrate (SPIRIVA RESPIMAT) 2.5 MCG/ACT AERS Inhale 2 puffs into the lungs daily. 10/09/16  Yes Roslynn Amble, MD    Physical Exam: Vitals:   02/07/17 1940 02/07/17 1945 02/07/17 1950 02/07/17 1955  BP:      Pulse: 70 75 70 77  Resp:      Temp:    98.4 F (36.9 C)  TempSrc:      SpO2: 94% 96% 94% 94%  Weight:      Height:        Constitutional: NAD, calm, comfortable, pale Vitals:   02/07/17 1940 02/07/17 1945 02/07/17 1950 02/07/17 1955  BP:      Pulse: 70 75 70 77  Resp:      Temp:    98.4 F (36.9 C)  TempSrc:      SpO2: 94% 96% 94% 94%  Weight:      Height:       Eyes: PERRL, lids and conjunctivae  normal ENMT: Mucous membranes are moist. Posterior pharynx clear of any exudate or lesions.Normal dentition.  Neck: normal, supple, no masses, no thyromegaly Respiratory: clear to auscultation bilaterally, no wheezing, no crackles. Normal respiratory effort. No accessory muscle use.  Cardiovascular: Regular rate and rhythm, no murmurs / rubs / gallops. No extremity edema. 2+ pedal pulses. No carotid bruits.  Abdomen: no tenderness, no masses palpated. No hepatosplenomegaly. Bowel  sounds positive.  Musculoskeletal: no clubbing / cyanosis. No joint deformity upper and lower extremities. Good ROM, no contractures. Normal muscle tone.  Skin: no rashes, lesions, ulcers. No induration Neurologic: CN 2-12 grossly intact. Sensation intact, DTR normal. Strength 5/5 in all 4.  Psychiatric: Normal judgment and insight. Alert and oriented x 3. Normal mood.    Labs on Admission: I have personally reviewed following labs and imaging studies  CBC:  Recent Labs Lab 02/07/17 1900  WBC 4.8  NEUTROABS 2.9  HGB 7.6*  HCT 25.7*  MCV 62.5*  PLT 334   Basic Metabolic Panel:  Recent Labs Lab 02/07/17 1900  NA 136  K 3.7  CL 104  CO2 27  GLUCOSE 96  BUN 8  CREATININE 0.96  CALCIUM 8.4*   GFR: Estimated Creatinine Clearance: 82.6 mL/min (by C-G formula based on SCr of 0.96 mg/dL).  Cardiac Enzymes:  Recent Labs Lab 02/07/17 1900  TROPONINI <0.03   CBG:  Recent Labs Lab 02/07/17 1843  GLUCAP 92   Radiological Exams on Admission: Dg Chest 2 View  Result Date: 02/07/2017 CLINICAL DATA:  56 year old male with weakness. EXAM: CHEST  2 VIEW COMPARISON:  Stop chest radiograph dated 11/27/2016 FINDINGS: Mild underlying COPD with a trapping and hyperexpansion of the lungs and flattening of the diaphragms. Bilateral perihilar vascular prominence and hazy densities noted which are more prominent compared to the prior radiograph and may represent mild vascular congestion. Superimposed pneumonia  is not excluded. There is no pleural effusion or pneumothorax. The cardiac silhouette is within normal limits. No acute osseous pathology identified. IMPRESSION: Perihilar hazy densities likely mild vascular congestion. Pneumonia is not excluded. Clinical correlation and follow-up recommended. Electronically Signed   By: Elgie Collard M.D.   On: 02/07/2017 18:42   Ct Head Wo Contrast  Result Date: 02/07/2017 CLINICAL DATA:  56 year old male with difficulty speaking. EXAM: CT HEAD WITHOUT CONTRAST TECHNIQUE: Contiguous axial images were obtained from the base of the skull through the vertex without intravenous contrast. COMPARISON:  None FINDINGS: Brain: The ventricles and sulci are appropriate in size for patient's age. The gray-white matter discrimination is preserved. There is no acute intracranial hemorrhage. No mass effect or midline shift noted. No intra-axial fluid collection. Vascular: No hyperdense vessel or unexpected calcification. Skull: Normal. Negative for fracture or focal lesion. Sinuses/Orbits: No acute finding. Other: None IMPRESSION: No acute intracranial pathology. If symptoms persist and there are no contraindications, MRI may provide better evaluation if clinically indicated. Electronically Signed   By: Elgie Collard M.D.   On: 02/07/2017 18:48   Old record reviewed  ekg reviewed nsr no acute issues Case discussed with edp   Assessment/Plan\ 55 yo male with melena, brbpr with anemia  Principal Problem:   GIB (gastrointestinal bleeding)- pt refusing rectal exam.  Place on protonix 40mg  iv q 12 hours.  Transfuse 2 units prbcs.  Vitals stable.  Stool not grossly bloody.  Check anemia panel.  Consult gi for potential scoping tomorrow, keep npo after midnight.  Active Problems:   Acute blood loss anemia- due to gi losses, transfusing   Peptic ulcer disease- protonix   [name] (peripheral vascular disease) (HCC)- hold aspirin products   COPD, severe (HCC)- stable at this time,  cont home inhalers   Weakness generalized- due to anemia   Dizziness- due to anemia   Bipolar disorder (HCC)- stable    DVT prophylaxis:  scds Code Status:  full Family Communication:  none Disposition Plan:  Per day team Consults called:  GI requested in EPIC Admission status:  admission   Ellard Nan A MD [facility]  If 7PM-7AM, please contact night-coverage [email or web address] Password [facility]  02/07/2017, 8:43 PM

## 2017-02-07 NOTE — ED Notes (Signed)
EKG given to Dr Pickering 

## 2017-02-08 ENCOUNTER — Encounter (HOSPITAL_COMMUNITY): Payer: Self-pay | Admitting: Gastroenterology

## 2017-02-08 DIAGNOSIS — D649 Anemia, unspecified: Secondary | ICD-10-CM

## 2017-02-08 LAB — BASIC METABOLIC PANEL
Anion gap: 7 (ref 5–15)
BUN: 7 mg/dL (ref 6–20)
CO2: 26 mmol/L (ref 22–32)
Calcium: 8.7 mg/dL — ABNORMAL LOW (ref 8.9–10.3)
Chloride: 106 mmol/L (ref 101–111)
Creatinine, Ser: 0.92 mg/dL (ref 0.61–1.24)
GFR calc Af Amer: >60 mL/min (ref >60)
GFR calc non Af Amer: >60 mL/min (ref >60)
Glucose, Bld: 98 mg/dL (ref 65–99)
Potassium: 4 mmol/L (ref 3.5–5.1)
Sodium: 139 mmol/L (ref 135–145)

## 2017-02-08 LAB — IRON AND TIBC
Iron: 14 ug/dL — ABNORMAL LOW (ref 45–182)
Saturation Ratios: 3 % — ABNORMAL LOW (ref 17.9–39.5)
TIBC: 519 ug/dL — ABNORMAL HIGH (ref 250–450)
UIBC: 505 ug/dL

## 2017-02-08 LAB — CBC
HCT: 34.5 % — ABNORMAL LOW (ref 39.0–52.0)
Hemoglobin: 10.5 g/dL — ABNORMAL LOW (ref 13.0–17.0)
MCH: 20.3 pg — ABNORMAL LOW (ref 26.0–34.0)
MCHC: 30.4 g/dL (ref 30.0–36.0)
MCV: 66.6 fL — ABNORMAL LOW (ref 78.0–100.0)
Platelets: 377 10*3/uL (ref 150–400)
RBC: 5.18 MIL/uL (ref 4.22–5.81)
RDW: 19.9 % — ABNORMAL HIGH (ref 11.5–15.5)
WBC: 10 10*3/uL (ref 4.0–10.5)

## 2017-02-08 LAB — FOLATE: Folate: 7.5 ng/mL (ref >5.9)

## 2017-02-08 LAB — RETICULOCYTES
RBC.: 4.3 MIL/uL (ref 4.22–5.81)
Retic Count, Absolute: 64.5 10*3/uL (ref 19.0–186.0)
Retic Ct Pct: 1.5 % (ref 0.4–3.1)

## 2017-02-08 LAB — ABO/RH: ABO/RH(D): O POS

## 2017-02-08 LAB — PREPARE RBC (CROSSMATCH)

## 2017-02-08 LAB — HEMOGLOBIN AND HEMATOCRIT, BLOOD
HCT: 34.8 % — ABNORMAL LOW (ref 39.0–52.0)
HCT: 29.2 % — ABNORMAL LOW (ref 39.0–52.0)
Hemoglobin: 10.7 g/dL — ABNORMAL LOW (ref 13.0–17.0)
Hemoglobin: 9.1 g/dL — ABNORMAL LOW (ref 13.0–17.0)

## 2017-02-08 LAB — FERRITIN: Ferritin: 6 ng/mL — ABNORMAL LOW (ref 24–336)

## 2017-02-08 LAB — VITAMIN B12: Vitamin B-12: 287 pg/mL (ref 180–914)

## 2017-02-08 MED ORDER — ACETAMINOPHEN 325 MG PO TABS
650.0000 mg | ORAL_TABLET | ORAL | Status: DC | PRN
  Administered 2017-02-09: 650 mg via ORAL
  Filled 2017-02-08: qty 2

## 2017-02-08 MED ORDER — ALBUTEROL SULFATE (2.5 MG/3ML) 0.083% IN NEBU
INHALATION_SOLUTION | RESPIRATORY_TRACT | Status: AC
  Administered 2017-02-08: 2.5 mg
  Filled 2017-02-08: qty 3

## 2017-02-08 MED ORDER — IPRATROPIUM BROMIDE 0.02 % IN SOLN
RESPIRATORY_TRACT | Status: AC
  Administered 2017-02-08: 0.5 mg
  Filled 2017-02-08: qty 2.5

## 2017-02-08 MED ORDER — ALBUTEROL SULFATE (2.5 MG/3ML) 0.083% IN NEBU
2.5000 mg | INHALATION_SOLUTION | Freq: Four times a day (QID) | RESPIRATORY_TRACT | Status: DC
  Administered 2017-02-08 – 2017-02-10 (×8): 2.5 mg via RESPIRATORY_TRACT
  Filled 2017-02-08 (×8): qty 3

## 2017-02-08 MED ORDER — ALBUTEROL SULFATE (2.5 MG/3ML) 0.083% IN NEBU
2.5000 mg | INHALATION_SOLUTION | RESPIRATORY_TRACT | Status: DC | PRN
  Administered 2017-02-08: 2.5 mg via RESPIRATORY_TRACT
  Filled 2017-02-08: qty 3

## 2017-02-08 NOTE — Progress Notes (Signed)
Follow up note from earlier admission today: 56 yo with hx of bipolar disorder, COPD, active smoker, admitted for anemia, suspicious of slow GI Bleed, Hb 7 g/dL, confused and hypotensive.  CT head negative, and he was Tx with neb for wheezing along with IVF and 2 units of PRBCs.  Hb is 10 this morning, normotensive, and no longer confused. GI saw him and planned for both upper and lower GI endoscopies tomorrow. On exam, he has some wheezing, no rales, with soft abdomen. Will continue with IVF, PPI IV Q12, clear liquid, home meds. No ASA, NSAIDS, or steroid.  Houston SirenPeter Ronon Ferger MD FACP. Hospitalist.

## 2017-02-08 NOTE — Progress Notes (Signed)
Atrovent given with extra albuterol neb at 0524 as patient appears to be very SOB. Very decreased with wheezes. He has long exp time.

## 2017-02-08 NOTE — Progress Notes (Signed)
Pt stood up to void and put his pants on and he got very sob, loud inspiratory and expiratory wheezing, labored breathing with accessory muscle use. Had respiratory evaluate him as well but it is too early for another breathing treatment at this time. Will continue to monitor him.

## 2017-02-08 NOTE — Consult Note (Signed)
Referring Provider: Dr. Tarry Kosachal David  Primary Care Physician:  Pamelia HoitWILSON,FRED HENRY, MD Primary Gastroenterologist:  Dr. Jena Gaussourk   Date of Admission: 02/07/17 Date of Consultation:  02/08/17  Reason for Consultation:  GI bleed, anemia   HPI:  Jeffrey [name] is a 56 y.o. year old male presenting to the ED with worsening shortness of breath. He reports a chronic history of dyspnea on exertion and shortness of breath, but he noted worsening of symptoms and states he had a spell where he couldn't breathe.  No chest pain. While sitting up in bed, feels short of breath, use of accessory muscles. Notes red blood in stool for 3 years. No black, tarry stool per patient but does state it is "dark". No abdominal pain. Occasional nausea. Vomited this morning, which he feels is related to not having anything to drink. No hematemesis. No constipation or diarrhea. No dysphagia. No aspirin powders. Takes 81 mg aspirin. No NSAIDs. Admitting Hgb 7.6, receiving 2 units PRBCs, which completed early this morning around 0630. Post-transfusion H/H  10.5. Denies drug use, although marijuana is listed in his chart. Per admitting H&P, patient had defecated all over the floor as he was unable to get assistance to bathroom; this was brown without evidence of blood per H&P notes. He declined rectal exam on admission.   Notes chronic shortness of breath and chronic cough. During consultation today, patient is sitting up on side of bed, obviously short of breath, with use of accessory muscles. He does state he feels somewhat better since blood transfusion. CXR with perihilar hazy densities likely mild vascular congestion, unable to exclude pneumonia. He was hypotensive on admission and confused, with negative CT head. Hypotension now resolved and BP 149/82. Confusion improved.   Past Medical History:  Diagnosis Date  . Acute blood loss anemia 02/07/2017  . Anxiety   . Arthritis    deg disease, bulging disk,  shoulder level  .  Bipolar disorder (HCC)   . Bipolar disorder (HCC)   . 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 Depression    anxiety  . Hyperlipidemia   . Peptic ulcer disease    Review  . Pneumonia   . [name] (peripheral vascular disease) (HCC) 06/18/2015  . Shortness of breath     Past Surgical History:  Procedure Laterality Date  . COLONOSCOPY  03/14/2007   WUJ:WJXBJYR:Normal colonoscopy and terminal ileoscopy except external hemorrhoids  . COLONOSCOPY N/A 05/05/2013   Dr. Jena Gaussourk: external/internal anal canal hemorrhoids, unable to intubate TI, segemental biopsies unremarkable   . ESOPHAGOGASTRODUODENOSCOPY  03/14/2007   NWG:[id number]:Nonerosive antral gastritis with bulbar duodenitis/paucity to postbulbar duodenal folds and biopsy were benign with no evidence of villous atrophy.  Marland Kitchen. HAND SURGERY     left, secondary to self-inflicted laceration  . SHOULDER SURGERY     right  . TOE SURGERY     left great toe , amputated-lawnmover accident    Prior to Admission medications   Medication Sig Start Date End Date Taking? Authorizing Provider  albuterol (PROAIR HFA) 108 (90 Base) MCG/ACT inhaler Inhale 2 puffs into the lungs every 4 (four) hours as needed for wheezing or shortness of breath. 02/02/17  Yes Roslynn AmbleJennings E Nestor, MD  albuterol (PROVENTIL) (2.5 MG/3ML) 0.083% nebulizer solution Take 3 mLs (2.5 mg total) by nebulization every 4 (four) hours as needed for shortness of breath. 02/02/17  Yes Roslynn AmbleJennings E Nestor, MD  aspirin EC 81 MG tablet Take 1 tablet (81 mg total) by mouth daily. Patient taking differently:  Take 81 mg by mouth at bedtime.  10/08/16  Yes Laqueta Linden, MD  atorvastatin (LIPITOR) 20 MG tablet Take 20 mg by mouth daily.    Yes Historical Provider, MD  budesonide-formoterol (SYMBICORT) 160-4.5 MCG/ACT inhaler Inhale 2 puffs into the lungs 2 (two) times daily. 08/26/16  Yes Roslynn Amble, MD  clonazePAM (KLONOPIN) 0.5 MG tablet Take 1 tablet (0.5 mg total) by mouth 2 (two) times daily. 01/06/17 01/06/18 Yes  Myrlene Broker, MD  divalproex (DEPAKOTE) 250 MG DR tablet Take 3 tablets (750 mg total) by mouth at bedtime. 01/06/17  Yes Myrlene Broker, MD  fenofibrate micronized (LOFIBRA) 134 MG capsule Take 134 mg by mouth daily before breakfast.    Yes Historical Provider, MD  FLUoxetine (PROZAC) 40 MG capsule Take 1 capsule (40 mg total) by mouth every morning. 01/06/17  Yes Myrlene Broker, MD  metoprolol (LOPRESSOR) 50 MG tablet Take 50 mg by mouth 2 (two) times daily.   Yes Historical Provider, MD  OLANZapine (ZYPREXA) 5 MG tablet Take 1 tablet (5 mg total) by mouth at bedtime. 01/06/17 01/06/18 Yes Myrlene Broker, MD  temazepam (RESTORIL) 30 MG capsule Take 1 capsule (30 mg total) by mouth at bedtime as needed for sleep. 01/06/17  Yes Myrlene Broker, MD  Tiotropium Bromide Monohydrate (SPIRIVA RESPIMAT) 2.5 MCG/ACT AERS Inhale 2 puffs into the lungs daily. 10/09/16  Yes Roslynn Amble, MD    Current Facility-Administered Medications  Medication Dose Route Frequency Provider Last Rate Last Dose  . 0.9 %  sodium chloride infusion   Intravenous Continuous Haydee Monica, MD 100 mL/hr at 02/08/17 0636    . acetaminophen (TYLENOL) tablet 650 mg  650 mg Oral Q4H PRN Haydee Monica, MD      . albuterol (PROVENTIL) (2.5 MG/3ML) 0.083% nebulizer solution 2.5 mg  2.5 mg Nebulization Q6H [name], MD   2.5 mg at 02/08/17 0731  . albuterol (PROVENTIL) (2.5 MG/3ML) 0.083% nebulizer solution 2.5 mg  2.5 mg Nebulization Q2H PRN [name], MD      . clonazePAM Scarlette Calico) tablet 0.5 mg  0.5 mg Oral BID Haydee Monica, MD   0.5 mg at 02/08/17 0050  . divalproex (DEPAKOTE) DR tablet 750 mg  750 mg Oral QHS Haydee Monica, MD   750 mg at 02/08/17 0050  . ondansetron (ZOFRAN) tablet 4 mg  4 mg Oral Q6H PRN Haydee Monica, MD       Or  . ondansetron (ZOFRAN) injection 4 mg  4 mg Intravenous Q6H PRN Haydee Monica, MD      . pantoprazole (PROTONIX) injection 40 mg  40 mg Intravenous Q12H Haydee Monica, MD   40 mg at 02/08/17  0050  . tiotropium (SPIRIVA) inhalation capsule 18 mcg  1 capsule Inhalation Daily Haydee Monica, MD        Allergies as of 02/07/2017 - Review Complete 02/07/2017  Allergen Reaction Noted  . Augmentin [amoxicillin-pot clavulanate] Rash and Other (See Comments) 12/16/2011  . Ace inhibitors Hives 05/05/2013    Family History  Problem Relation Age of Onset  . Breast cancer Mother     deceased  . Heart disease Father   . Depression Daughter   . Anxiety disorder Daughter   . Anxiety disorder Son   . Depression Son   . Asthma Brother   . Heart attack Maternal Aunt   . Heart attack Maternal Uncle   . Heart attack Paternal Aunt   .  Heart attack Paternal Uncle   . Heart attack Maternal Grandmother   . Heart attack Maternal Grandfather   . Emphysema Maternal Grandfather   . Heart attack Paternal Grandmother   . Heart attack Paternal Grandfather   . Colon cancer Neg Hx   . Liver disease Neg Hx     Social History   Social History  . Marital status: Divorced    Spouse name: N/A  . Number of children: 2  . Years of education: N/A   Occupational History  . Disabled    Social History Main Topics  . Smoking status: Current Every Day Smoker    Packs/day: 0.50    Years: 40.00    Types: Cigarettes    Start date: 08/04/1971  . Smokeless tobacco: Never Used     Comment: peak rate of 2.5ppd, 1/2ppd on 11/27/2016  . Alcohol use No  . Drug use: Yes    Types: Marijuana     Comment: most days   . Sexual activity: No   Other Topics Concern  . Not on file   Social History Narrative   Originally from [place]. Previously has lived in [facility] [place] 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 Questionable asbestos exposure. Does have significant exposure to fumes. No mold exposure. No bird exposure. No pets currently.     Review of Systems: Gen: Denies fever, chills, loss of appetite, change in weight or weight loss CV: Denies  chest pain, heart palpitations, syncope, edema  Resp: see HPI  GI: see HPI  GU : Denies urinary burning, urinary frequency, urinary incontinence.  MS: Denies joint pain,swelling, cramping Derm: Denies rash, itching, dry skin Psych: see HPI  Heme: see HPI   Physical Exam: Vital signs in last 24 hours: Temp:  [97.4 F (36.3 C)-98.6 F (37 C)] 98.2 F (36.8 C) (03/19 1610) Pulse Rate:  [65-100] 97 (03/19 0632) Resp:  [18-29] 24 (03/19 0632) BP: (80-149)/(62-87) 149/82 (03/19 0632) SpO2:  [92 %-100 %] 92 % (03/19 0732) Weight:  [150 lb (68 kg)] 150 lb (68 kg) (03/18 1735) Last BM Date: 02/08/17 General:   Alert, sitting on edge of bed, use of accessory muscles at times to breathe but no distress  Head:  Normocephalic and atraumatic. Eyes:  Sclera clear, no icterus.   Conjunctiva pink. Ears:  Normal auditory acuity. Nose:  No deformity, discharge,  or lesions. Mouth:  No deformity or lesions Lungs:  Expiratory wheezes bilaterally, diminished bases, scattered rhonchi Heart:  Regular rate and rhythm Abdomen:  Soft, nontender and nondistended. No masses, hepatosplenomegaly or hernias noted. Normal bowel sounds, without guarding, and without rebound.   Rectal:  Deferred until time of colonoscopy.   Msk:  Symmetrical without gross deformities. Normal posture. Extremities:  Without edema. Neurologic:  Alert and  oriented to person, situation, but believes he is in [place]. Quickly reorients.  Psych:  Alert and cooperative. Normal mood and affect.  Intake/Output from previous day: 03/18 0701 - 03/19 0700 In: 715 [I.V.:150; Blood:565] Out: -  Intake/Output this shift: No intake/output data recorded.  Lab Results:  Recent Labs  02/07/17 1900 02/08/17 0808  WBC 4.8 10.0  HGB 7.6* 10.5*  HCT 25.7* 34.5*  PLT 334 377   BMET  Recent Labs  02/07/17 1900 02/08/17 0808  NA 136 139  K 3.7 4.0  CL 104 106  CO2 27 26  GLUCOSE 96 98  BUN 8 7  CREATININE 0.96  0.92  CALCIUM  8.4* 8.7*     Studies/Results: Dg Chest 2 View  Result Date: 02/07/2017 CLINICAL DATA:  56 year old male with weakness. EXAM: CHEST  2 VIEW COMPARISON:  Stop chest radiograph dated 11/27/2016 FINDINGS: Mild underlying COPD with a trapping and hyperexpansion of the lungs and flattening of the diaphragms. Bilateral perihilar vascular prominence and hazy densities noted which are more prominent compared to the prior radiograph and may represent mild vascular congestion. Superimposed pneumonia is not excluded. There is no pleural effusion or pneumothorax. The cardiac silhouette is within normal limits. No acute osseous pathology identified. IMPRESSION: Perihilar hazy densities likely mild vascular congestion. Pneumonia is not excluded. Clinical correlation and follow-up recommended. Electronically Signed   By: Elgie Collard M.D.   On: 02/07/2017 18:42   Ct Head Wo Contrast  Result Date: 02/07/2017 CLINICAL DATA:  56 year old male with difficulty speaking. EXAM: CT HEAD WITHOUT CONTRAST TECHNIQUE: Contiguous axial images were obtained from the base of the skull through the vertex without intravenous contrast. COMPARISON:  None FINDINGS: Brain: The ventricles and sulci are appropriate in size for patient's age. The gray-white matter discrimination is preserved. There is no acute intracranial hemorrhage. No mass effect or midline shift noted. No intra-axial fluid collection. Vascular: No hyperdense vessel or unexpected calcification. Skull: Normal. Negative for fracture or focal lesion. Sinuses/Orbits: No acute finding. Other: None IMPRESSION: No acute intracranial pathology. If symptoms persist and there are no contraindications, MRI may provide better evaluation if clinically indicated. Electronically Signed   By: Elgie Collard M.D.   On: 02/07/2017 18:48    Impression: 56 year old male admitted with symptomatic anemia, reports of bright red blood and dark stool but none noted since admission.  Improvement in Hgb from 7.6 to 10.5 after 2 units PRBCs. Anemia panel pending: I am unsure if these were drawn before or after blood transfusion. Last colonoscopy in 2014 with hemorrhoids and unremarkable segmental biopsies, unable to intubate TI. Last EGD in 2008 with gastritis and duodenitis, no evidence of celiac disease. Clinically, shortness of breath has slightly improved since admission, but I am unclear if he is near his baseline, as he is still tachypneic with use of accessory muscles while sitting up in bed. Some improvement subjectively since receiving nebulizer treatments.    Unclear if having true melena, and bright red blood per rectum appears chronic per patient. As he has presented with symptomatic anemia, would recommend colonoscopy/EGD for thorough evaluation, as his last evaluation via colonoscopy was in 2014 and EGD almost 10 years ago. Denies any NSAIDs, anticoagulation, and he is only taking an 81 mg aspirin daily. Anticipate procedures with Propofol due to polypharmacy. Will allow clear liquids now and plan on evaluation tomorrow, giving time to return to baseline respiratory status and appropriate for sedation.   Plan: Clear liquids Continue monitoring of H/H Monitor for overt GI bleeding PPI IV BID Anticipate colonoscopy/EGD tomorrow for diagnostic purposes: will likely need with Propofol vs Phenergan 25 mg on call.  Will continue to follow with you  Gelene Mink, ANP-BC [facility]     LOS: 1 day    02/08/2017, 10:25 AM

## 2017-02-09 ENCOUNTER — Inpatient Hospital Stay (HOSPITAL_COMMUNITY): Payer: Medicare HMO | Admitting: Anesthesiology

## 2017-02-09 ENCOUNTER — Encounter (HOSPITAL_COMMUNITY): Payer: Self-pay

## 2017-02-09 ENCOUNTER — Encounter (HOSPITAL_COMMUNITY)
Admission: EM | Disposition: A | Discharge status: Home / Self Care | Payer: Self-pay | Source: Home / Self Care | Attending: Internal Medicine

## 2017-02-09 DIAGNOSIS — D62 Acute posthemorrhagic anemia: Secondary | ICD-10-CM

## 2017-02-09 DIAGNOSIS — K3189 Other diseases of stomach and duodenum: Secondary | ICD-10-CM

## 2017-02-09 DIAGNOSIS — K2980 Duodenitis without bleeding: Secondary | ICD-10-CM

## 2017-02-09 HISTORY — PX: ESOPHAGOGASTRODUODENOSCOPY (EGD) WITH PROPOFOL: SHX5813

## 2017-02-09 HISTORY — PX: BIOPSY: SHX5522

## 2017-02-09 LAB — TYPE AND SCREEN
ABO/RH(D): O POS
Antibody Screen: NEGATIVE
Unit division: 0
Unit division: 0

## 2017-02-09 LAB — BPAM RBC
Blood Product Expiration Date: 201804072359
Blood Product Expiration Date: 201804092359
ISSUE DATE / TIME: 201803190208
ISSUE DATE / TIME: 201803190425
Unit Type and Rh: 5100
Unit Type and Rh: 5100

## 2017-02-09 LAB — HEMOGLOBIN AND HEMATOCRIT, BLOOD
HCT: 28.5 % — ABNORMAL LOW (ref 39.0–52.0)
HCT: 29.3 % — ABNORMAL LOW (ref 39.0–52.0)
Hemoglobin: 9.1 g/dL — ABNORMAL LOW (ref 13.0–17.0)
Hemoglobin: 9.3 g/dL — ABNORMAL LOW (ref 13.0–17.0)

## 2017-02-09 SURGERY — ESOPHAGOGASTRODUODENOSCOPY (EGD) WITH PROPOFOL
Anesthesia: Monitor Anesthesia Care

## 2017-02-09 MED ORDER — TEMAZEPAM 15 MG PO CAPS
30.0000 mg | ORAL_CAPSULE | Freq: Every evening | ORAL | Status: DC | PRN
  Administered 2017-02-10: 30 mg via ORAL
  Filled 2017-02-09: qty 2

## 2017-02-09 MED ORDER — MIDAZOLAM HCL 2 MG/2ML IJ SOLN
INTRAMUSCULAR | Status: AC
  Filled 2017-02-09: qty 2

## 2017-02-09 MED ORDER — FENTANYL CITRATE (PF) 100 MCG/2ML IJ SOLN
INTRAMUSCULAR | Status: AC
  Filled 2017-02-09: qty 2

## 2017-02-09 MED ORDER — PROPOFOL 500 MG/50ML IV EMUL
INTRAVENOUS | Status: DC | PRN
  Administered 2017-02-09: 125 ug/kg/min via INTRAVENOUS

## 2017-02-09 MED ORDER — IPRATROPIUM-ALBUTEROL 0.5-2.5 (3) MG/3ML IN SOLN
RESPIRATORY_TRACT | Status: AC
  Filled 2017-02-09: qty 3

## 2017-02-09 MED ORDER — SODIUM CHLORIDE 0.9 % IV SOLN: INTRAVENOUS | Status: DC

## 2017-02-09 MED ORDER — LACTATED RINGERS IV SOLN
INTRAVENOUS | Status: DC
  Administered 2017-02-09: 14:00:00 via INTRAVENOUS

## 2017-02-09 MED ORDER — LIDOCAINE VISCOUS 2 % MT SOLN
OROMUCOSAL | Status: AC
  Filled 2017-02-09: qty 15

## 2017-02-09 MED ORDER — MIDAZOLAM HCL 2 MG/2ML IJ SOLN
1.0000 mg | INTRAMUSCULAR | Status: DC
  Administered 2017-02-09: 2 mg via INTRAVENOUS

## 2017-02-09 MED ORDER — PANTOPRAZOLE SODIUM 40 MG PO TBEC
40.0000 mg | DELAYED_RELEASE_TABLET | Freq: Every day | ORAL | Status: DC
  Administered 2017-02-09: 40 mg via ORAL
  Filled 2017-02-09: qty 1

## 2017-02-09 MED ORDER — GLYCOPYRROLATE 0.2 MG/ML IJ SOLN: 0.1000 mg | Freq: Once | INTRAMUSCULAR | Status: DC

## 2017-02-09 MED ORDER — LIDOCAINE VISCOUS 2 % MT SOLN
15.0000 mL | Freq: Once | OROMUCOSAL | Status: AC
  Administered 2017-02-09: 15 mL via OROMUCOSAL

## 2017-02-09 MED ORDER — FENTANYL CITRATE (PF) 100 MCG/2ML IJ SOLN
25.0000 ug | Freq: Once | INTRAMUSCULAR | Status: AC
  Administered 2017-02-09: 25 ug via INTRAVENOUS

## 2017-02-09 MED ORDER — GLYCOPYRROLATE 0.2 MG/ML IJ SOLN
0.2000 mg | Freq: Once | INTRAMUSCULAR | Status: AC
  Administered 2017-02-09: 0.2 mg via INTRAVENOUS

## 2017-02-09 MED ORDER — IPRATROPIUM-ALBUTEROL 0.5-2.5 (3) MG/3ML IN SOLN
3.0000 mL | Freq: Once | RESPIRATORY_TRACT | Status: AC
  Administered 2017-02-09: 3 mL via RESPIRATORY_TRACT

## 2017-02-09 MED ORDER — GLYCOPYRROLATE 0.2 MG/ML IJ SOLN
INTRAMUSCULAR | Status: AC
  Filled 2017-02-09: qty 1

## 2017-02-09 MED ORDER — LIDOCAINE VISCOUS 2 % MT SOLN
OROMUCOSAL | Status: AC
Start: 2017-02-09 — End: 2017-02-09
  Filled 2017-02-09: qty 15

## 2017-02-09 NOTE — Care Management Note (Signed)
Case Management Note  Patient Details  Name: Jeffrey [name] T Wethington MRN: [id number] Date of Birth: Aug 29, 1961  Subjective/Objective:                  Admitted with GIB. Pt from home, has strong support therapy. Pt has PCP, drives himself to appointments and has no insurance with drug coverage. Pt has no HH or DME needs pta. No concerns communicated at time of assessment.   Action/Plan: Pt plans to return home with self care at DC.   Expected Discharge Date:    02/09/2017               Expected Discharge Plan:  Home/Self Care  In-House Referral:  NA  Discharge planning Services  CM Consult  Post Acute Care Choice:  NA Choice offered to:  NA  Status of Service:  Completed, signed off  Malcolm MetroChildress, Giles Currie Demske, RN 02/09/2017, 9:19 AM

## 2017-02-09 NOTE — Progress Notes (Signed)
PROGRESS NOTE    Jeffrey [name]  XLK:[id number] DOB: Oct 31, 1961 DOA: 02/07/2017 PCP: Pamelia Hoit, MD    Brief Narrative: 56 yo with hx of bipolar disorder, COPD, active smoker, admitted for anemia, suspicious of slow GI Bleed, Hb 7 g/dL, confused and hypotensive.  CT head negative, and he was Tx with neb for wheezing along with IVF and 2 units of PRBCs.  Hb's are now stable at 9, normotensive, and no longer confused. GI saw him and planned for both upper and lower GI endoscopies, but he declined lower endoscopy.  No ASA, NSAIDS, or steroid.  He is on PPI Q12.  There was an tick removed from his belly yesterday, but it was not engorged, and there was no ECM rash.  No antibiotic was given, not indicated.    Assessment & Plan:   Principal Problem:   GIB (gastrointestinal bleeding) Active Problems:   [name] (peripheral vascular disease) (HCC)   COPD, severe (HCC)   Acute blood loss anemia   Weakness generalized   Dizziness   Peptic ulcer disease   Bipolar disorder (HCC)   Anemia  Anemia:  Slow GI Bleed.  For EGD today.  Outpatient colonoscopy as he refused lower GI.  PUD:  Continue with IV PPI BID.  COPD:  Better with nebs.  Bipolar disorder:  Stable.   DVT prophylaxis:  scds Code Status:  full Family Communication:  none Disposition Plan:  Per day team Consults called:  GI requested in EPIC Admission status:  admission  Consultants: GI.   Procedures:   EGD.  Antimicrobials: Anti-infectives    None       Subjective:   No complaints.   Objective: Vitals:   02/08/17 2013 02/08/17 2100 02/09/17 0138 02/09/17 0644  BP:  117/66  128/62  Pulse:  86  85  Resp:  16  18  Temp:  98.5 F (36.9 C)  98.4 F (36.9 C)  TempSrc:  Oral  Oral  SpO2: 91% 97% 97% 97%  Weight:      Height:        Intake/Output Summary (Last 24 hours) at 02/09/17 0848 Last data filed at 02/09/17 0645  Gross per 24 hour  Intake              240 ml  Output              950 ml  Net              -710 ml   Filed Weights   02/07/17 1735  Weight: 68 kg (150 lb)    Examination:  General exam: Appears calm and comfortable  Respiratory system: Clear to auscultation. Respiratory effort normal. Cardiovascular system: S1 & S2 heard, RRR. No JVD, murmurs, rubs, gallops or clicks. No pedal edema. Gastrointestinal system: Abdomen is nondistended, soft and nontender. No organomegaly or masses felt. Normal bowel sounds heard. Central nervous system: Alert and oriented. No focal neurological deficits. Extremities: Symmetric 5 x 5 power. Skin: No rashes, lesions or ulcers Psychiatry: Judgement and insight appear normal. Mood & affect appropriate.   Data Reviewed: I have personally reviewed following labs and imaging studies  CBC:  Recent Labs Lab 02/07/17 1900 02/08/17 0808 02/08/17 1047 02/08/17 2001 02/09/17 0244  WBC 4.8 10.0  --   --   --   NEUTROABS 2.9  --   --   --   --   HGB 7.6* 10.5* 10.7* 9.1* 9.1*  HCT 25.7* 34.5* 34.8* 29.2* 28.5*  MCV  62.5* 66.6*  --   --   --   PLT 334 377  --   --   --    Basic Metabolic Panel:  Recent Labs Lab 02/07/17 1900 02/08/17 0808  NA 136 139  K 3.7 4.0  CL 104 106  CO2 27 26  GLUCOSE 96 98  BUN 8 7  CREATININE 0.96 0.92  CALCIUM 8.4* 8.7*   GFR:   Recent Labs Lab 02/07/17 1900  TROPONINI <0.03   CBG:  Recent Labs Lab 02/07/17 1843  GLUCAP 92   Anemia Panel:  Recent Labs  02/08/17 0036  VITAMINB12 287  FOLATE 7.5  FERRITIN 6*  TIBC 519*  IRON 14*  RETICCTPCT 1.5   Sepsis Labs:  Recent Labs Lab 02/07/17 1923  LATICACIDVEN 0.94   Radiology Studies: Dg Chest 2 View  Result Date: 02/07/2017 CLINICAL DATA:  56 year old male with weakness. EXAM: CHEST  2 VIEW COMPARISON:  Stop chest radiograph dated 11/27/2016 FINDINGS: Mild underlying COPD with a trapping and hyperexpansion of the lungs and flattening of the diaphragms. Bilateral perihilar vascular prominence and hazy densities noted which  are more prominent compared to the prior radiograph and may represent mild vascular congestion. Superimposed pneumonia is not excluded. There is no pleural effusion or pneumothorax. The cardiac silhouette is within normal limits. No acute osseous pathology identified. IMPRESSION: Perihilar hazy densities likely mild vascular congestion. Pneumonia is not excluded. Clinical correlation and follow-up recommended. Electronically Signed   By: Elgie Collard M.D.   On: 02/07/2017 18:42   Ct Head Wo Contrast  Result Date: 02/07/2017 CLINICAL DATA:  56 year old male with difficulty speaking. EXAM: CT HEAD WITHOUT CONTRAST TECHNIQUE: Contiguous axial images were obtained from the base of the skull through the vertex without intravenous contrast. COMPARISON:  None FINDINGS: Brain: The ventricles and sulci are appropriate in size for patient's age. The gray-white matter discrimination is preserved. There is no acute intracranial hemorrhage. No mass effect or midline shift noted. No intra-axial fluid collection. Vascular: No hyperdense vessel or unexpected calcification. Skull: Normal. Negative for fracture or focal lesion. Sinuses/Orbits: No acute finding. Other: None IMPRESSION: No acute intracranial pathology. If symptoms persist and there are no contraindications, MRI may provide better evaluation if clinically indicated. Electronically Signed   By: Elgie Collard M.D.   On: 02/07/2017 18:48    Scheduled Meds: . albuterol  2.5 mg Nebulization Q6H  . clonazePAM  0.5 mg Oral BID  . divalproex  750 mg Oral QHS  . pantoprazole (PROTONIX) IV  40 mg Intravenous Q12H  . tiotropium  1 capsule Inhalation Daily   Continuous Infusions:   LOS: 2 days   [name], MD FACP Hospitalist.   If 7PM-7AM, please contact night-coverage [email or web address] Password [facility] 02/09/2017, 8:48 AM

## 2017-02-09 NOTE — Progress Notes (Signed)
Assessed patient this morning for clinical readiness of procedures. He appears to be at his baseline respiratory status and much improved since admission. He exhibits no distress and without use of accessory muscles. Lungs with scattered rhonchi, improved from yesterday. I spoke at length with him regarding colonoscopy/EGD. He is declining the colonoscopy, stating he would rather pursue the upper endoscopy first and then pursue a colonoscopy if EGD is unrevealing. His last colonoscopy was in 2014 and prior to this was 2008. No personal history of adenomas or family history of colon cancer. His last EGD was in 2008 with gastritis. I discussed that with his presentation, laboratory results, IDA, that a thorough evaluation (to include colonoscopy) was indicated. He understands this and acknowledges that there could be a colonic etiology that we would be unaware of. He is agreeable to undergoing a colonoscopy in the future as an outpatient if EGD is unrevealing. He wanted to make sure that we wouldn't be "upset" for his desire to decline a colonoscopy.   He has had no overt GI bleeding since admission. He is scheduled for EGD only with Propofol today with Dr. Darrick PennaFields, with the understanding that a colonoscopy is recommended but will be put on hold for now at his request.    Gelene MinkAnna W. Versie Soave, ANP-BC [facility]

## 2017-02-09 NOTE — Op Note (Signed)
[facility] Patient Name: Jeffrey [name] Procedure Date: 02/09/2017 2:37 PM MRN: [id number] Date of Birth: 01-12-1961 Attending MD: Jonette Eva , MD CSN: [id number] Age: 56 Admit Type: Inpatient Procedure:                Upper GI endoscopy with COLD FORCEPS BIOPSY Indications:              Anemia-NL Hb 2014 AND PRESENTED WITH Hb 7.4. LAST                            TCS 2014. USING ASA W/O PPI. Providers:                Jonette Eva, MD, Criselda Peaches. Patsy Lager, RN, Dyann Ruddle Referring MD:             Richmond Campbell PA-C, PA-C Medicines:                Propofol per Anesthesia Complications:            No immediate complications. Estimated Blood Loss:     Estimated blood loss was minimal. Procedure:                Pre-Anesthesia Assessment:                           - Prior to the procedure, a History and Physical                            was performed, and patient medications and                            allergies were reviewed. The patient's tolerance of                            previous anesthesia was also reviewed. The risks                            and benefits of the procedure and the sedation                            options and risks were discussed with the patient.                            All questions were answered, and informed consent                            was obtained. Prior Anticoagulants: The patient has                            taken aspirin. ASA Grade Assessment: III - A                            patient with severe systemic disease. After  reviewing the risks and benefits, the patient was                            deemed in satisfactory condition to undergo the                            procedure.                           After obtaining informed consent, the endoscope was                            passed under direct vision. Throughout the                            procedure, the  patient's blood pressure, pulse, and                            oxygen saturations were monitored continuously. The                            EG-[id number] ([id number]) scope was introduced through the                            mouth, and advanced to the second part of duodenum.                            The upper GI endoscopy was accomplished without                            difficulty. The patient tolerated the procedure                            well. Scope In: 3:03:24 PM Scope Out: 3:13:06 PM Total Procedure Duration: 0 hours 9 minutes 42 seconds  Findings:      A few dispersed, small non-bleeding erosions were found in the gastric       body. There were no stigmata of recent bleeding. Biopsies were taken       with a cold forceps for Helicobacter pylori testing.      Patchy mild inflammation characterized by congestion (edema) and       erythema was found in the duodenal bulb.      The second portion of the duodenum was normal. Biopsies for histology       were taken with a cold forceps for evaluation of celiac disease(4+2).      A non-obstructing Schatzki ring (acquired) was found at the       gastroesophageal junction. Impression:               . SCHATZKI'S RING                           - MILD TO MODERATE Non-bleeding erosive gastropathy.                           - MILD Duodenitis. ANEMIA MOST LIKELY DUE  TO NSAID                            ENTEROPATHY OR SMALL BOWEL AVMs.                           - Moderate Sedation:      Per Anesthesia Care Recommendation:           - Return patient to hospital ward for ongoing care.                           - Low fat diet.                           - Continue present medications.                           - Use Protonix (pantoprazole) 40 mg PO daily.                           - Await pathology results. CONSIDER GIVENS IF NO                            SOURCE FOR ANEMIA IDENTIFIED.                           - Return to my office in 4  weeks. Procedure Code(s):        --- Professional ---                           [id number], Esophagogastroduodenoscopy, flexible,                            transoral; with biopsy, single or multiple Diagnosis Code(s):        --- Professional ---                           K31.89, Other diseases of stomach and duodenum                           K29.80, Duodenitis without bleeding                           D64.9, Anemia, unspecified CPT copyright 2016 American Medical Association. All rights reserved. The codes documented in this report are preliminary and upon coder review may  be revised to meet current compliance requirements. Jonette Eva, MD Jonette Eva, MD 02/09/2017 3:37:34 PM This report has been signed electronically. Number of Addenda: 0

## 2017-02-09 NOTE — OR Nursing (Signed)
Patient had a bout of coughing lasting approximately 2 minutes. Chest sounds crackly. O2 sat 94%. Dr. Jayme CloudGonzalez notified and orders given at 1430

## 2017-02-09 NOTE — Transfer of Care (Signed)
Immediate Anesthesia Transfer of Care Note  Patient: Jeffrey [name]  Procedure(s) Performed: Procedure(s) with comments: ESOPHAGOGASTRODUODENOSCOPY (EGD) WITH PROPOFOL (N/A) BIOPSY - duodenal gastric  Patient Location: PACU  Anesthesia Type:MAC  Level of Consciousness: awake, alert , oriented and patient cooperative  Airway & Oxygen Therapy: Patient Spontanous Breathing and Patient connected to nasal cannula oxygen  Post-op Assessment: Report given to RN and Post -op Vital signs reviewed and stable  Post vital signs: Reviewed and stable  Last Vitals:  Vitals:   02/09/17 1432 02/09/17 1445  BP: 127/82   Pulse:    Resp: (!) 24 (!) 0  Temp:      Last Pain:  Vitals:   02/09/17 1239  TempSrc: Oral  PainSc:       Patients Stated Pain Goal: 2 (33/00/76 2263)  Complications: No apparent anesthesia complications

## 2017-02-09 NOTE — Anesthesia Postprocedure Evaluation (Signed)
Anesthesia Post Note  Patient: Jeffrey [name]  Procedure(s) Performed: Procedure(s) (LRB): ESOPHAGOGASTRODUODENOSCOPY (EGD) WITH PROPOFOL (N/A) BIOPSY  Patient location during evaluation: PACU Anesthesia Type: MAC Level of consciousness: awake and alert, patient cooperative and oriented Pain management: pain level controlled Vital Signs Assessment: post-procedure vital signs reviewed and stable Respiratory status: spontaneous breathing and patient connected to nasal cannula oxygen (Breathing at baseline but does appear labored at times and still coughing) Cardiovascular status: blood pressure returned to baseline and stable Postop Assessment: no headache and no signs of nausea or vomiting Anesthetic complications: no     Last Vitals:  Vitals:   02/09/17 1445 02/09/17 1526  BP:  129/74  Pulse:  (!) 110  Resp: (!) 0 (!) 24  Temp:      Last Pain:  Vitals:   02/09/17 1239  TempSrc: Oral  PainSc:                  Jonathan Corpus

## 2017-02-09 NOTE — Anesthesia Postprocedure Evaluation (Signed)
Anesthesia Post Note  Patient: Jeffrey [name]  Procedure(s) Performed: Procedure(s) (LRB): ESOPHAGOGASTRODUODENOSCOPY (EGD) WITH PROPOFOL (N/A) BIOPSY  Patient location during evaluation: PACU Anesthesia Type: MAC Level of consciousness: awake and alert Pain management: satisfactory to patient Vital Signs Assessment: post-procedure vital signs reviewed and stable Respiratory status: spontaneous breathing Cardiovascular status: stable Anesthetic complications: no     Last Vitals:  Vitals:   02/09/17 1539 02/09/17 1547  BP: 132/83 134/83  Pulse: (!) 107 94  Resp: 20 18  Temp:      Last Pain:  Vitals:   02/09/17 1239  TempSrc: Oral  PainSc:                  Drucie Opitz

## 2017-02-09 NOTE — Anesthesia Preprocedure Evaluation (Signed)
Anesthesia Evaluation  Patient identified by MRN, date of birth, ID band Patient awake    Reviewed: Allergy & Precautions, NPO status , Patient's Chart, lab work & pertinent test results  Airway Mallampati: I  TM Distance: >3 FB     Dental  (+) Edentulous Upper, Edentulous Lower   Pulmonary shortness of breath, COPD, Current Smoker,    breath sounds clear to auscultation       Cardiovascular + Peripheral Vascular Disease   Rhythm:Regular Rate:Normal     Neuro/Psych PSYCHIATRIC DISORDERS Anxiety Depression Bipolar Disorder    GI/Hepatic PUD, GI bleed    Endo/Other    Renal/GU      Musculoskeletal   Abdominal   Peds  Hematology  (+) anemia ,   Anesthesia Other Findings   Reproductive/Obstetrics                             Anesthesia Physical Anesthesia Plan  ASA: III  Anesthesia Plan: MAC   Post-op Pain Management:    Induction: Intravenous  Airway Management Planned: Simple Face Mask  Additional Equipment:   Intra-op Plan:   Post-operative Plan:   Informed Consent: I have reviewed the patients History and Physical, chart, labs and discussed the procedure including the risks, benefits and alternatives for the proposed anesthesia with the patient or authorized representative who has indicated his/her understanding and acceptance.     Plan Discussed with:   Anesthesia Plan Comments:         Anesthesia Quick Evaluation

## 2017-02-09 NOTE — Addendum Note (Signed)
Addendum  created 02/09/17 1602 by Franco Noneseresa S Marvella Jenning, CRNA   Sign clinical note

## 2017-02-09 NOTE — H&P (Signed)
Primary Care Physician:  Pamelia Hoit, MD Primary Gastroenterologist:  Dr. Darrick Penna  Pre-Procedure History & Physical: HPI:  Jeffrey [name] is a 56 y.o. male here for  ANEMIA.  Past Medical History:  Diagnosis Date  . Acute blood loss anemia 02/07/2017  . Anxiety   . Arthritis    deg disease, bulging disk,  shoulder level  . Bipolar disorder (HCC)   . Bipolar disorder (HCC)   . 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 Depression    anxiety  . Hyperlipidemia   . Peptic ulcer disease    Review  . Pneumonia   . [name] (peripheral vascular disease) (HCC) 06/18/2015  . Shortness of breath     Past Surgical History:  Procedure Laterality Date  . COLONOSCOPY  03/14/2007   ZOX:[id number] colonoscopy and terminal ileoscopy except external hemorrhoids  . COLONOSCOPY N/A 05/05/2013   [name]: external/internal anal canal hemorrhoids, unable to intubate TI, segemental biopsies unremarkable   . ESOPHAGOGASTRODUODENOSCOPY  03/14/2007   [id number]:[id number] antral gastritis with bulbar duodenitis/paucity to postbulbar duodenal folds and biopsy were benign with no evidence of villous atrophy.  Marland Kitchen HAND SURGERY     left, secondary to self-inflicted laceration  . SHOULDER SURGERY     right  . TOE SURGERY     left great toe , amputated-lawnmover accident    Prior to Admission medications   Medication Sig Start Date End Date Taking? Authorizing Provider  albuterol (PROAIR HFA) 108 (90 Base) MCG/ACT inhaler Inhale 2 puffs into the lungs every 4 (four) hours as needed for wheezing or shortness of breath. 02/02/17  Yes Roslynn Amble, MD  albuterol (PROVENTIL) (2.5 MG/3ML) 0.083% nebulizer solution Take 3 mLs (2.5 mg total) by nebulization every 4 (four) hours as needed for shortness of breath. 02/02/17  Yes Roslynn Amble, MD  aspirin EC 81 MG tablet Take 1 tablet (81 mg total) by mouth daily. Patient taking differently: Take 81 mg by mouth at bedtime.  10/08/16  Yes Laqueta Linden, MD  atorvastatin  (LIPITOR) 20 MG tablet Take 20 mg by mouth daily.    Yes Historical Provider, MD  budesonide-formoterol (SYMBICORT) 160-4.5 MCG/ACT inhaler Inhale 2 puffs into the lungs 2 (two) times daily. 08/26/16  Yes Roslynn Amble, MD  clonazePAM (KLONOPIN) 0.5 MG tablet Take 1 tablet (0.5 mg total) by mouth 2 (two) times daily. 01/06/17 01/06/18 Yes Myrlene Broker, MD  divalproex (DEPAKOTE) 250 MG DR tablet Take 3 tablets (750 mg total) by mouth at bedtime. 01/06/17  Yes Myrlene Broker, MD  fenofibrate micronized (LOFIBRA) 134 MG capsule Take 134 mg by mouth daily before breakfast.    Yes Historical Provider, MD  FLUoxetine (PROZAC) 40 MG capsule Take 1 capsule (40 mg total) by mouth every morning. 01/06/17  Yes Myrlene Broker, MD  metoprolol (LOPRESSOR) 50 MG tablet Take 50 mg by mouth 2 (two) times daily.   Yes Historical Provider, MD  OLANZapine (ZYPREXA) 5 MG tablet Take 1 tablet (5 mg total) by mouth at bedtime. 01/06/17 01/06/18 Yes Myrlene Broker, MD  temazepam (RESTORIL) 30 MG capsule Take 1 capsule (30 mg total) by mouth at bedtime as needed for sleep. 01/06/17  Yes Myrlene Broker, MD  Tiotropium Bromide Monohydrate (SPIRIVA RESPIMAT) 2.5 MCG/ACT AERS Inhale 2 puffs into the lungs daily. 10/09/16  Yes Roslynn Amble, MD    Allergies as of 02/07/2017 - Review Complete 02/07/2017  Allergen Reaction Noted  . Augmentin [amoxicillin-pot clavulanate] Rash and Other (See  Comments) 12/16/2011  . Ace inhibitors Hives 05/05/2013    Family History  Problem Relation Age of Onset  . Breast cancer Mother     deceased  . Heart disease Father   . Depression Daughter   . Anxiety disorder Daughter   . Anxiety disorder Son   . Depression Son   . Asthma Brother   . Heart attack Maternal Aunt   . Heart attack Maternal Uncle   . Heart attack Paternal Aunt   . Heart attack Paternal Uncle   . Heart attack Maternal Grandmother   . Heart attack Maternal Grandfather   . Emphysema Maternal Grandfather   . Heart  attack Paternal Grandmother   . Heart attack Paternal Grandfather   . Colon cancer Neg Hx   . Liver disease Neg Hx     Social History   Social History  . Marital status: Divorced    Spouse name: N/A  . Number of children: 2  . Years of education: N/A   Occupational History  . Disabled    Social History Main Topics  . Smoking status: Current Every Day Smoker    Packs/day: 0.50    Years: 40.00    Types: Cigarettes    Start date: 08/04/1971  . Smokeless tobacco: Never Used     Comment: peak rate of 2.5ppd, 1/2ppd on 11/27/2016  . Alcohol use No  . Drug use: Yes    Types: Marijuana     Comment: most days   . Sexual activity: No   Other Topics Concern  . Not on file   Social History Narrative   Originally from [place] 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 Questionable asbestos exposure. Does have significant exposure to fumes. No mold exposure. No bird exposure. No pets currently.     Review of Systems: See HPI, otherwise negative ROS   Physical Exam: BP 127/82   Pulse 90   Temp 99.5 F (37.5 C) (Oral)   Resp (!) 24   Ht 5\' 10"  (1.778 m)   Wt 150 lb (68 kg)   SpO2 97%   BMI 21.52 kg/m  General:   Alert,  pleasant and cooperative in NAD Head:  Normocephalic and atraumatic. Neck:  Supple; Lungs:  Clear throughout to auscultation.    Heart:  Regular rate and rhythm. Abdomen:  Soft, nontender and nondistended. Normal bowel sounds, without guarding, and without rebound.   Neurologic:  Alert and  oriented x4;  grossly normal neurologically.  Impression/Plan:   Anemia  PLAN:  1. EGD TODAY. DISCUSSED PROCEDURE, BENEFITS, & RISKS: < 1% chance of medication reaction, bleeding, or perforation.

## 2017-02-10 ENCOUNTER — Encounter (HOSPITAL_COMMUNITY): Payer: Self-pay | Admitting: Gastroenterology

## 2017-02-10 DIAGNOSIS — K264 Chronic or unspecified duodenal ulcer with hemorrhage: Secondary | ICD-10-CM

## 2017-02-10 MED ORDER — PANTOPRAZOLE SODIUM 40 MG PO TBEC: 40.0000 mg | DELAYED_RELEASE_TABLET | Freq: Every day | ORAL | 2 refills | Status: DC

## 2017-02-10 NOTE — Discharge Summary (Signed)
Physician Discharge Summary  Jeffrey [name] Davidian ZOX:[id number] DOB: 08/18/1961 DOA: 02/07/2017  PCP: Pamelia HoitWILSON,FRED HENRY, MD  Admit date: 02/07/2017 Discharge date: 02/10/2017  Time spent: 45 minutes  Recommendations for Outpatient Follow-up:  -Will be discharged home today. -Advised to follow up with GI in 1-2 weeks.   Discharge Diagnoses:  Principal Problem:   GIB (gastrointestinal bleeding) Active Problems:   [name] (peripheral vascular disease) (HCC)   COPD, severe (HCC)   Acute blood loss anemia   Weakness generalized   Dizziness   Peptic ulcer disease   Bipolar disorder (HCC)   Anemia   Discharge Condition: Stable and improved  Filed Weights   02/07/17 1735 02/09/17 1239  Weight: 68 kg (150 lb) 68 kg (150 lb)    History of present illness:  As per Dr. Onalee Huaavid on 3/18: Jeffrey [name] Allers is a 56 y.o. male with medical history significant of bipolar, copd comes in with weeks of sob and weakness, to the point he has no energy to walk much.  Denies chest pain.  No fevers.  Reports he has been having both dark stools and sometimes mixed bright red for months maybe years.  He denies any vomiting.  No etoh use.  Denies any abdominal pain ever.  Reports he has hemorrhoids and thought this was all due to that.  Upon arrival to room, pt has defecated all over the floor, as he could not get help to go to the bathroom.  It is brown with no evidence of blood.  Hospital Course:   Anemia/Rectal Bleeding -Slow, Has not required transfusion this admission. -EGD:  SCHATZKI'S RING                           - MILD TO MODERATE Non-bleeding erosive gastropathy.                           - MILD Duodenitis. ANEMIA MOST LIKELY DUE TO NSAID                            ENTEROPATHY OR SMALL BOWEL AVMs. -Colonoscopy was recommended but he has refused to have this done as an inpatient. -Will consider in the OP setting.  PUD -Continue PPI.  Bipolar Disorder -Stable.   Procedures:  As above    Consultations:  GI  Discharge Instructions  Discharge Instructions    Diet - low sodium heart healthy    Complete by:  As directed    Increase activity slowly    Complete by:  As directed      Allergies as of 02/10/2017      Reactions   Augmentin [amoxicillin-pot Clavulanate] Rash, Other (See Comments)   Has patient had a PCN reaction causing immediate rash, facial/tongue/throat swelling, SOB or lightheadedness with hypotension: No Has patient had a PCN reaction causing severe rash involving mucus membranes or skin necrosis: No Has patient had a PCN reaction that required hospitalization No Has patient had a PCN reaction occurring within the last 10 years: Yes If all of the above answers are "NO", then may proceed with Cephalosporin use.   Ace Inhibitors Hives      Medication List    STOP taking these medications   aspirin EC 81 MG tablet     TAKE these medications   albuterol 108 (90 Base) MCG/ACT inhaler Commonly known as:  PROAIR HFA Inhale  2 puffs into the lungs every 4 (four) hours as needed for wheezing or shortness of breath.   albuterol (2.5 MG/3ML) 0.083% nebulizer solution Commonly known as:  PROVENTIL Take 3 mLs (2.5 mg total) by nebulization every 4 (four) hours as needed for shortness of breath.   atorvastatin 20 MG tablet Commonly known as:  LIPITOR Take 20 mg by mouth daily.   budesonide-formoterol 160-4.5 MCG/ACT inhaler Commonly known as:  SYMBICORT Inhale 2 puffs into the lungs 2 (two) times daily.   clonazePAM 0.5 MG tablet Commonly known as:  KLONOPIN Take 1 tablet (0.5 mg total) by mouth 2 (two) times daily.   divalproex 250 MG DR tablet Commonly known as:  DEPAKOTE Take 3 tablets (750 mg total) by mouth at bedtime.   fenofibrate micronized 134 MG capsule Commonly known as:  LOFIBRA Take 134 mg by mouth daily before breakfast.   FLUoxetine 40 MG capsule Commonly known as:  PROZAC Take 1 capsule (40 mg total) by mouth every  morning.   metoprolol 50 MG tablet Commonly known as:  LOPRESSOR Take 50 mg by mouth 2 (two) times daily.   OLANZapine 5 MG tablet Commonly known as:  ZYPREXA Take 1 tablet (5 mg total) by mouth at bedtime.   pantoprazole 40 MG tablet Commonly known as:  PROTONIX Take 1 tablet (40 mg total) by mouth daily before supper.   temazepam 30 MG capsule Commonly known as:  RESTORIL Take 1 capsule (30 mg total) by mouth at bedtime as needed for sleep.   Tiotropium Bromide Monohydrate 2.5 MCG/ACT Aers Commonly known as:  SPIRIVA RESPIMAT Inhale 2 puffs into the lungs daily.      Allergies  Allergen Reactions  . Augmentin [Amoxicillin-Pot Clavulanate] Rash and Other (See Comments)    Has patient had a PCN reaction causing immediate rash, facial/tongue/throat swelling, SOB or lightheadedness with hypotension: No Has patient had a PCN reaction causing severe rash involving mucus membranes or skin necrosis: No Has patient had a PCN reaction that required hospitalization No Has patient had a PCN reaction occurring within the last 10 years: Yes If all of the above answers are "NO", then may proceed with Cephalosporin use.  Donivan Scull Inhibitors Hives   Follow-up Information    Jonette Eva, MD. Schedule an appointment as soon as possible for a visit in 2 week(s).   Specialty:  Gastroenterology Contact information: [address] [place] [address] [phone]        Pamelia Hoit, MD Follow up on 02/19/2017.   Specialty:  Family Medicine Why:  appointment time 10:20 am Contact information: [address] [place] [address]            The results of significant diagnostics from this hospitalization (including imaging, microbiology, ancillary and laboratory) are listed below for reference.    Significant Diagnostic Studies: Dg Chest 2 View  Result Date: 02/07/2017 CLINICAL DATA:  56 year old male with weakness. EXAM: CHEST  2 VIEW COMPARISON:  Stop chest  radiograph dated 11/27/2016 FINDINGS: Mild underlying COPD with a trapping and hyperexpansion of the lungs and flattening of the diaphragms. Bilateral perihilar vascular prominence and hazy densities noted which are more prominent compared to the prior radiograph and may represent mild vascular congestion. Superimposed pneumonia is not excluded. There is no pleural effusion or pneumothorax. The cardiac silhouette is within normal limits. No acute osseous pathology identified. IMPRESSION: Perihilar hazy densities likely mild vascular congestion. Pneumonia is not excluded. Clinical correlation and follow-up recommended. Electronically Signed  By: Elgie Collard M.D.   On: 02/07/2017 18:42   Ct Head Wo Contrast  Result Date: 02/07/2017 CLINICAL DATA:  56 year old male with difficulty speaking. EXAM: CT HEAD WITHOUT CONTRAST TECHNIQUE: Contiguous axial images were obtained from the base of the skull through the vertex without intravenous contrast. COMPARISON:  None FINDINGS: Brain: The ventricles and sulci are appropriate in size for patient's age. The gray-white matter discrimination is preserved. There is no acute intracranial hemorrhage. No mass effect or midline shift noted. No intra-axial fluid collection. Vascular: No hyperdense vessel or unexpected calcification. Skull: Normal. Negative for fracture or focal lesion. Sinuses/Orbits: No acute finding. Other: None IMPRESSION: No acute intracranial pathology. If symptoms persist and there are no contraindications, MRI may provide better evaluation if clinically indicated. Electronically Signed   By: Elgie Collard M.D.   On: 02/07/2017 18:48    Microbiology: No results found for this or any previous visit (from the past 240 hour(s)).   Labs: Basic Metabolic Panel:  Recent Labs Lab 02/07/17 1900 02/08/17 0808  NA 136 139  K 3.7 4.0  CL 104 106  CO2 27 26  GLUCOSE 96 98  BUN 8 7  CREATININE 0.96 0.92  CALCIUM 8.4* 8.7*   Liver Function  Tests: No results for input(s): AST, ALT, ALKPHOS, BILITOT, PROT, ALBUMIN in the last 168 hours. No results for input(s): LIPASE, AMYLASE in the last 168 hours. No results for input(s): AMMONIA in the last 168 hours. CBC:  Recent Labs Lab 02/07/17 1900 02/08/17 0808 02/08/17 1047 02/08/17 2001 02/09/17 0244 02/09/17 1037  WBC 4.8 10.0  --   --   --   --   NEUTROABS 2.9  --   --   --   --   --   HGB 7.6* 10.5* 10.7* 9.1* 9.1* 9.3*  HCT 25.7* 34.5* 34.8* 29.2* 28.5* 29.3*  MCV 62.5* 66.6*  --   --   --   --   PLT 334 377  --   --   --   --    Cardiac Enzymes:  Recent Labs Lab 02/07/17 1900  TROPONINI <0.03   BNP: BNP (last 3 results) No results for input(s): BNP in the last 8760 hours.  ProBNP (last 3 results) No results for input(s): PROBNP in the last 8760 hours.  CBG:  Recent Labs Lab 02/07/17 1843  GLUCAP 92       Signed:  HERNANDEZ ACOSTA,ESTELA  [facility] Pager: [phone] 02/10/2017, 7:04 PM

## 2017-02-10 NOTE — Progress Notes (Signed)
Discharge instructions on medications and follow up visits,patient verbalized understanding. Prescriptions sent with patient.No c/o pain or discomfort noted.Accompanied by staff to an awaiting vehicle.

## 2017-02-10 NOTE — Care Management Important Message (Signed)
Important Message  Patient Details  Name: Jeffrey [name] T Mull MRN: [id number] Date of Birth: [birth year]   Medicare Important Message Given:  Yes    Malcolm MetroChildress, Trevante Tennell Demske, RN 02/10/2017, 1:04 PM

## 2017-02-16 ENCOUNTER — Encounter: Payer: Self-pay | Admitting: Internal Medicine

## 2017-02-16 NOTE — Progress Notes (Signed)
APPT MADE AND LETTER SENT  °

## 2017-02-20 ENCOUNTER — Other Ambulatory Visit: Payer: Self-pay | Admitting: Pulmonary Disease

## 2017-02-25 ENCOUNTER — Other Ambulatory Visit: Payer: Self-pay | Admitting: Pulmonary Disease

## 2017-03-01 ENCOUNTER — Emergency Department (HOSPITAL_COMMUNITY)
Admission: EM | Admit: 2017-03-01 | Discharge: 2017-03-02 | Disposition: A | Discharge status: Home / Self Care | Payer: Medicare Other | Attending: Emergency Medicine | Admitting: Emergency Medicine

## 2017-03-01 DIAGNOSIS — Z79899 Other long term (current) drug therapy: Secondary | ICD-10-CM | POA: Insufficient documentation

## 2017-03-01 DIAGNOSIS — J441 Chronic obstructive pulmonary disease with (acute) exacerbation: Secondary | ICD-10-CM | POA: Diagnosis not present

## 2017-03-01 DIAGNOSIS — D500 Iron deficiency anemia secondary to blood loss (chronic): Secondary | ICD-10-CM | POA: Diagnosis not present

## 2017-03-01 DIAGNOSIS — F17210 Nicotine dependence, cigarettes, uncomplicated: Secondary | ICD-10-CM | POA: Diagnosis not present

## 2017-03-01 DIAGNOSIS — R413 Other amnesia: Secondary | ICD-10-CM | POA: Insufficient documentation

## 2017-03-01 DIAGNOSIS — R0602 Shortness of breath: Secondary | ICD-10-CM | POA: Diagnosis present

## 2017-03-01 NOTE — ED Provider Notes (Signed)
[facility] Provider Note   CSN: [id number] Arrival date & time: 03/01/17  2322  By signing my name below, I, Diona Browner, attest that this documentation has been prepared under the direction and in the presence of Devoria Albe, MD. Electronically Signed: Diona Browner, ED Scribe. 03/02/17. 12:53 AM.  Time seen 23:58 PM   History   Chief Complaint Chief Complaint  Patient presents with  . Shortness of Breath    HPI Jeffrey [name] is a 56 y.o. male with a PMHx of COPD who presents to the Emergency Department complaining of gradually worsening SOB that started ~ 3-4 days ago. Associated sx include productive cough (white), and wheezing. He thinks he used his Albuterol today with mild relief. He relates worsening DOE. He ran out of his spiriva and symbicort inhalers.  Secondary to onset his daughter has noticed intermittent short term memory loss over the past year, but acute worsening today. Per pt's daughter he doesn't remember what he did today. He was admitted to the hospital last month for similar symptoms (COPD and memory problems) and she was told the memory problems were from dehydration and blood loss. He had an EDG done in the hospital but left before he could have a colonoscopy done. He is scheduled to see Dr Darrick Penna, GI, on 4/17 to get scheduled for a colonoscopy. He also c/o of intermittent rectal bleeding that he feels are hemorrhoids that happens ~ twice a week for the last month. He states he feels swelling around his rectum and feels the need to have BM's. Pt notes blood "gushes" out even when he isn't going to the bathroom, and then the swelling is gone. Marland Kitchen  He was seen on 02/07/17 in the ED for similar sx. Pt denies CP, HA, rhinorrhea, and sore throat. Daughter doesn't think he had a head CT done, but he did have one on March 18 which didn't show a reason for his memory problems.    The history is provided by the patient and a relative. No language interpreter was used.     PCP Pamelia Hoit, MD   Past Medical History:  Diagnosis Date  . Acute blood loss anemia 02/07/2017  . Anxiety   . Arthritis    deg disease, bulging disk,  shoulder level  . Bipolar disorder (HCC)   . Bipolar disorder (HCC)   . 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 Depression    anxiety  . Hyperlipidemia   . Peptic ulcer disease    Review  . Pneumonia   . [name] (peripheral vascular disease) (HCC) 06/18/2015  . Shortness of breath     Patient Active Problem List   Diagnosis Date Noted  . Anemia   . GIB (gastrointestinal bleeding) 02/07/2017  . Acute blood loss anemia 02/07/2017  . Weakness generalized 02/07/2017  . Dizziness 02/07/2017  . Peptic ulcer disease   . Bipolar disorder (HCC)   . 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 Dyspnea 08/26/2016  . H/O: depression 08/26/2016  . Dyslipidemia 08/26/2016  . Tobacco use disorder 08/26/2016  . [name] (peripheral vascular disease) (HCC) 06/18/2015  . Chronic diarrhea 05/02/2013  . Hematochezia 05/02/2013  . Abnormal weight loss 05/02/2013  . Abdominal pain, periumbilical 05/02/2013    Past Surgical History:  Procedure Laterality Date  . BIOPSY  02/09/2017   Procedure: BIOPSY;  Surgeon: [name], MD;  Location: [facility];  Service: Endoscopy;;  duodenal gastric  . COLONOSCOPY  03/14/2007   [id number] colonoscopy and terminal ileoscopy  except external hemorrhoids  . COLONOSCOPY N/A 05/05/2013   [name]: external/internal anal canal hemorrhoids, unable to intubate TI, segemental biopsies unremarkable   . ESOPHAGOGASTRODUODENOSCOPY  03/14/2007   [id number] antral gastritis with bulbar duodenitis/paucity to postbulbar duodenal folds and biopsy were benign with no evidence of villous atrophy.  . ESOPHAGOGASTRODUODENOSCOPY (EGD) WITH PROPOFOL N/A 02/09/2017   Procedure: ESOPHAGOGASTRODUODENOSCOPY (EGD) WITH PROPOFOL;  Surgeon: [name], MD;  Location: [facility];  Service: Endoscopy;  Laterality: N/A;  . HAND  SURGERY     left, secondary to self-inflicted laceration  . SHOULDER SURGERY     right  . TOE SURGERY     left great toe , amputated-lawnmover accident       Home Medications    Prior to Admission medications   Medication Sig Start Date End Date Taking? Authorizing Provider  albuterol (PROAIR HFA) 108 (90 Base) MCG/ACT inhaler Inhale 2 puffs into the lungs every 4 (four) hours as needed for wheezing or shortness of breath. 02/02/17   Roslynn Amble, MD  albuterol (PROVENTIL) (2.5 MG/3ML) 0.083% nebulizer solution Take 3 mLs (2.5 mg total) by nebulization every 4 (four) hours as needed for shortness of breath. 02/02/17   Roslynn Amble, MD  atorvastatin (LIPITOR) 20 MG tablet Take 20 mg by mouth daily.     Historical Provider, MD  azithromycin (ZITHROMAX) 250 MG tablet Take first 2 tablets together, then 1 every day until finished. 03/02/17   Devoria Albe, MD  budesonide-formoterol (SYMBICORT) 160-4.5 MCG/ACT inhaler Inhale 2 puffs into the lungs 2 (two) times daily. 03/02/17   Devoria Albe, MD  clonazePAM (KLONOPIN) 0.5 MG tablet Take 1 tablet (0.5 mg total) by mouth 2 (two) times daily. 01/06/17 01/06/18  Myrlene Broker, MD  divalproex (DEPAKOTE) 250 MG DR tablet Take 3 tablets (750 mg total) by mouth at bedtime. 01/06/17   Myrlene Broker, MD  fenofibrate micronized (LOFIBRA) 134 MG capsule Take 134 mg by mouth daily before breakfast.     Historical Provider, MD  ferrous sulfate 325 (65 FE) MG tablet Take 1 tablet (325 mg total) by mouth 3 (three) times daily with meals. 03/02/17   Devoria Albe, MD  FLUoxetine (PROZAC) 40 MG capsule Take 1 capsule (40 mg total) by mouth every morning. 01/06/17   Myrlene Broker, MD  metoprolol (LOPRESSOR) 50 MG tablet Take 50 mg by mouth 2 (two) times daily.    Historical Provider, MD  OLANZapine (ZYPREXA) 5 MG tablet Take 1 tablet (5 mg total) by mouth at bedtime. 01/06/17 01/06/18  Myrlene Broker, MD  pantoprazole (PROTONIX) 40 MG tablet Take 1 tablet (40 mg total) by  mouth daily before supper. 02/10/17   Henderson Cloud, MD  predniSONE (DELTASONE) 20 MG tablet Take 3 po QD x 3d , then 2 po QD x 3d then 1 po QD x 3d 03/02/17   Devoria Albe, MD  temazepam (RESTORIL) 30 MG capsule Take 1 capsule (30 mg total) by mouth at bedtime as needed for sleep. 01/06/17   Myrlene Broker, MD  Tiotropium Bromide Monohydrate (SPIRIVA RESPIMAT) 2.5 MCG/ACT AERS INHALE 2 PUFFS INTO THE LUNGS ONCE DAILY 03/02/17   Devoria Albe, MD    Family History Family History  Problem Relation Age of Onset  . Breast cancer Mother     deceased  . Heart disease Father   . Depression Daughter   . Anxiety disorder Daughter   . Anxiety disorder Son   . Depression Son   .  Asthma Brother   . Heart attack Maternal Aunt   . Heart attack Maternal Uncle   . Heart attack Paternal Aunt   . Heart attack Paternal Uncle   . Heart attack Maternal Grandmother   . Heart attack Maternal Grandfather   . Emphysema Maternal Grandfather   . Heart attack Paternal Grandmother   . Heart attack Paternal Grandfather   . Colon cancer Neg Hx   . Liver disease Neg Hx     Social History Social History  Substance Use Topics  . Smoking status: Current Every Day Smoker    Packs/day: 0.50    Years: 40.00    Types: Cigarettes    Start date: 08/04/1971  . Smokeless tobacco: Never Used     Comment: peak rate of 2.5ppd, 1/2ppd on 11/27/2016  . Alcohol use No  lives at home No oxygen   Allergies   Augmentin [amoxicillin-pot clavulanate] and Ace inhibitors   Review of Systems Review of Systems  HENT: Negative for rhinorrhea and sore throat.   Respiratory: Positive for cough, shortness of breath and wheezing.   Cardiovascular: Negative for chest pain.  Gastrointestinal: Positive for anal bleeding.  Neurological: Negative for headaches.  Psychiatric/Behavioral: Positive for confusion.  All other systems reviewed and are negative.    Physical Exam Updated Vital Signs BP 139/85 (BP Location: Left  Arm)   Pulse 81   Temp 97.8 F (36.6 C) (Oral)   Resp (!) 30   Ht  (1.778 m)   Wt 150 lb (68 kg)   SpO2 96%   BMI 21.52 kg/m   Vital signs normal    Physical Exam  Constitutional: He appears well-developed and well-nourished.  Non-toxic appearance. He does not appear ill. No distress.  HENT:  Head: Normocephalic and atraumatic.  Right Ear: External ear normal.  Left Ear: External ear normal.  Nose: Nose normal. No mucosal edema or rhinorrhea.  Mouth/Throat: Oropharynx is clear and moist and mucous membranes are normal. No dental abscesses or uvula swelling.  Eyes: Conjunctivae and EOM are normal. Pupils are equal, round, and reactive to light.  Neck: Normal range of motion and full passive range of motion without pain. Neck supple.  Cardiovascular: Normal rate, regular rhythm and normal heart sounds.  Exam reveals no gallop and no friction rub.   No murmur heard. Pulmonary/Chest: Effort normal. No respiratory distress. He has decreased breath sounds. He has wheezes. He has no rhonchi. He has no rales. He exhibits no tenderness and no crepitus.  Diffuse wheezing.   Abdominal: Soft. Normal appearance and bowel sounds are normal. He exhibits no distension. There is no tenderness. There is no rebound and no guarding.  Musculoskeletal: Normal range of motion. He exhibits no edema or tenderness.  Moves all extremities well.   Neurological: He is alert. He has normal strength. No cranial nerve deficit.  Orientated to month and year, but can not remember events that happened during the day today.  Skin: Skin is warm, dry and intact. No rash noted. No erythema. No pallor.  Psychiatric: He has a normal mood and affect. His speech is normal and behavior is normal. His mood appears not anxious.  Orientated to month and year, but can not remember events that happened during the day today.  Nursing note and vitals reviewed.    ED Treatments / Results  DIAGNOSTIC STUDIES: Oxygen  Saturation is 96% on RA, adequate by my interpretation.    Labs (all labs ordered are listed, but only abnormal results  are displayed) Results for orders placed or performed during the hospital encounter of 03/01/17  Comprehensive metabolic panel  Result Value Ref Range   Sodium 133 (L) 135 - 145 mmol/L   Potassium 3.8 3.5 - 5.1 mmol/L   Chloride 98 (L) 101 - 111 mmol/L   CO2 27 22 - 32 mmol/L   Glucose, Bld 82 65 - 99 mg/dL   BUN 12 6 - 20 mg/dL   Creatinine, Ser 2.13 0.61 - 1.24 mg/dL   Calcium 9.0 8.9 - 08.6 mg/dL   Total Protein 7.0 6.5 - 8.1 g/dL   Albumin 3.6 3.5 - 5.0 g/dL   AST 22 15 - 41 U/L   ALT 12 (L) 17 - 63 U/L   Alkaline Phosphatase 81 38 - 126 U/L   Total Bilirubin 0.2 (L) 0.3 - 1.2 mg/dL   GFR calc non Af Amer >60 >60 mL/min   GFR calc Af Amer >60 >60 mL/min   Anion gap 8 5 - 15  CBC with Differential  Result Value Ref Range   WBC 5.8 4.0 - 10.5 K/uL   RBC 4.85 4.22 - 5.81 MIL/uL   Hemoglobin 9.9 (L) 13.0 - 17.0 g/dL   HCT 57.8 (L) 46.9 - 62.9 %   MCV 66.4 (L) 78.0 - 100.0 fL   MCH 20.4 (L) 26.0 - 34.0 pg   MCHC 30.7 30.0 - 36.0 g/dL   RDW 52.8 (H) 41.3 - 24.4 %   Platelets 415 (H) 150 - 400 K/uL   Neutrophils Relative % 58 %   Neutro Abs 3.4 1.7 - 7.7 K/uL   Lymphocytes Relative 30 %   Lymphs Abs 1.8 0.7 - 4.0 K/uL   Monocytes Relative 9 %   Monocytes Absolute 0.5 0.1 - 1.0 K/uL   Eosinophils Relative 2 %   Eosinophils Absolute 0.1 0.0 - 0.7 K/uL   Basophils Relative 1 %   Basophils Absolute 0.1 0.0 - 0.1 K/uL  Brain natriuretic peptide  Result Value Ref Range   B Natriuretic Peptide 274.0 (H) 0.0 - 100.0 pg/mL   Laboratory interpretation all normal except stable anemia    EKG  EKG Interpretation None       Radiology Dg Chest 2 View  Result Date: 03/02/2017 CLINICAL DATA:  Acute onset of shortness of breath and wheezing. Confusion. Initial encounter. EXAM: CHEST  2 VIEW COMPARISON:  Chest radiograph performed 02/07/2017 FINDINGS: The  lungs are well-aerated. Mild vascular congestion is noted. Mild interstitial prominence may reflect mild interstitial edema. There is no evidence of pleural effusion or pneumothorax. The heart is normal in size; the mediastinal contour is within normal limits. No acute osseous abnormalities are seen. IMPRESSION: Mild vascular congestion. Mild interstitial prominence may reflect mild interstitial edema. Electronically Signed   By: Roanna Raider M.D.   On: 03/02/2017 01:31     Dg Chest 2 View  Result Date: 02/07/2017 CLINICAL DATA:  56 year old male with weakness.  IMPRESSION: Perihilar hazy densities likely mild vascular congestion. Pneumonia is not excluded. Clinical correlation and follow-up recommended. Electronically Signed   By: Elgie Collard M.D.   On: 02/07/2017 18:42   Ct Head Wo Contrast  Result Date: 02/07/2017 CLINICAL DATA:  56 year old male with difficulty speaking  IMPRESSION: No acute intracranial pathology. If symptoms persist and there are no contraindications, MRI may provide better evaluation if clinically indicated. Electronically Signed   By: Elgie Collard M.D.   On: 02/07/2017 18:48    Procedures Procedures (including critical care time)  Medications  Ordered in ED Medications  methylPREDNISolone sodium succinate (SOLU-MEDROL) 125 mg/2 mL injection 125 mg (125 mg Intravenous Given 03/02/17 0033)  albuterol (PROVENTIL,VENTOLIN) solution continuous neb (10 mg/hr Nebulization Given 03/02/17 0042)  ipratropium (ATROVENT) nebulizer solution 0.5 mg (0.5 mg Nebulization Given 03/02/17 0042)  albuterol (PROVENTIL) (2.5 MG/3ML) 0.083% nebulizer solution 5 mg (5 mg Nebulization Given 03/02/17 0212)  ipratropium (ATROVENT) nebulizer solution 0.5 mg (0.5 mg Nebulization Given 03/02/17 0212)     Initial Impression / Assessment and Plan / ED Course  I have reviewed the triage vital signs and the nursing notes.  Pertinent labs & imaging results that were available during my care of  the patient were reviewed by me and considered in my medical decision making (see chart for details).   MDM  COORDINATION OF CARE: 12:09 AM-Discussed next steps with pt. Pt verbalized understanding and is agreeable with the plan. Pt was given a continuous nebulizer with albuterol and atrovent. He was given IV solumedrol. Labs and CXR were ordered.  Discussed his head CT results with daughter, if admitted can get MRI in the AM (not available here at night).  After reviewing his chest x-ray a BNP was ordered.  Recheck at 2 AM patient has finished his continuous nebulizer. He states he's feeling better. On lung exam now he has improved air movement however he still has diffuse rhonchi and some wheezing. He was given a single albuterol nebulizer treatment with Atrovent. I had looked at his prior testing and he was noted to have a iron deficiency anemia. He denies being discharged from the hospital on iron pills. On review of his chart his hemoglobin had dropped to 7 and he did get transfused with packed red blood cells. At time of discharge however his hemoglobin was in the 9 range which he is still at today.  Recheck at 3:10 AM patient has finished his second nebulizer treatment (actually third albuterol). States he feels much better. On exam his lungs are now clear. He has good air movement.  Patient's pulse ox was 91-93% when ambulating. When I recheck him his lungs are still clear. He states he feels good. We discussed been on iron because he does have an iron deficiency anemia from his chronic blood loss. He already has an appointment to follow-up with GI in the next week. He can follow-up with his PCP to get further evaluation for his memory loss.   Final Clinical Impressions(s) / ED Diagnoses   Final diagnoses:  COPD exacerbation (HCC)  Memory problem  Iron deficiency anemia due to chronic blood loss    New Prescriptions New Prescriptions   AZITHROMYCIN (ZITHROMAX) 250 MG TABLET    Take  first 2 tablets together, then 1 every day until finished.   FERROUS SULFATE 325 (65 FE) MG TABLET    Take 1 tablet (325 mg total) by mouth 3 (three) times daily with meals.   PREDNISONE (DELTASONE) 20 MG TABLET    Take 3 po QD x 3d , then 2 po QD x 3d then 1 po QD x 3d   Plan discharge  Devoria Albe, MD, FACEP   I personally performed the services described in this documentation, which was scribed in my presence. The recorded information has been reviewed and considered.  Devoria Albe, MD, Concha Pyo, MD 03/02/17 [id number]

## 2017-03-02 ENCOUNTER — Encounter (HOSPITAL_COMMUNITY): Payer: Self-pay

## 2017-03-02 ENCOUNTER — Emergency Department (HOSPITAL_COMMUNITY): Payer: Medicare Other

## 2017-03-02 LAB — BRAIN NATRIURETIC PEPTIDE: B Natriuretic Peptide: 274 pg/mL — ABNORMAL HIGH (ref 0.0–100.0)

## 2017-03-02 LAB — COMPREHENSIVE METABOLIC PANEL
ALT: 12 U/L — ABNORMAL LOW (ref 17–63)
AST: 22 U/L (ref 15–41)
Albumin: 3.6 g/dL (ref 3.5–5.0)
Alkaline Phosphatase: 81 U/L (ref 38–126)
Anion gap: 8 (ref 5–15)
BUN: 12 mg/dL (ref 6–20)
CO2: 27 mmol/L (ref 22–32)
Calcium: 9 mg/dL (ref 8.9–10.3)
Chloride: 98 mmol/L — ABNORMAL LOW (ref 101–111)
Creatinine, Ser: 1 mg/dL (ref 0.61–1.24)
GFR calc Af Amer: >60 mL/min (ref >60)
GFR calc non Af Amer: >60 mL/min (ref >60)
Glucose, Bld: 82 mg/dL (ref 65–99)
Potassium: 3.8 mmol/L (ref 3.5–5.1)
Sodium: 133 mmol/L — ABNORMAL LOW (ref 135–145)
Total Bilirubin: 0.2 mg/dL — ABNORMAL LOW (ref 0.3–1.2)
Total Protein: 7 g/dL (ref 6.5–8.1)

## 2017-03-02 LAB — CBC WITH DIFFERENTIAL/PLATELET
Basophils Absolute: 0.1 10*3/uL (ref 0.0–0.1)
Basophils Relative: 1 %
Eosinophils Absolute: 0.1 10*3/uL (ref 0.0–0.7)
Eosinophils Relative: 2 %
HCT: 32.2 % — ABNORMAL LOW (ref 39.0–52.0)
Hemoglobin: 9.9 g/dL — ABNORMAL LOW (ref 13.0–17.0)
Lymphocytes Relative: 30 %
Lymphs Abs: 1.8 10*3/uL (ref 0.7–4.0)
MCH: 20.4 pg — ABNORMAL LOW (ref 26.0–34.0)
MCHC: 30.7 g/dL (ref 30.0–36.0)
MCV: 66.4 fL — ABNORMAL LOW (ref 78.0–100.0)
Monocytes Absolute: 0.5 10*3/uL (ref 0.1–1.0)
Monocytes Relative: 9 %
Neutro Abs: 3.4 10*3/uL (ref 1.7–7.7)
Neutrophils Relative %: 58 %
Platelets: 415 10*3/uL — ABNORMAL HIGH (ref 150–400)
RBC: 4.85 MIL/uL (ref 4.22–5.81)
RDW: 21.3 % — ABNORMAL HIGH (ref 11.5–15.5)
WBC: 5.8 10*3/uL (ref 4.0–10.5)

## 2017-03-02 MED ORDER — IPRATROPIUM BROMIDE 0.02 % IN SOLN
0.5000 mg | Freq: Once | RESPIRATORY_TRACT | Status: AC
  Administered 2017-03-02: 0.5 mg via RESPIRATORY_TRACT
  Filled 2017-03-02: qty 2.5

## 2017-03-02 MED ORDER — AZITHROMYCIN 250 MG PO TABS: ORAL_TABLET | ORAL | 0 refills | Status: DC

## 2017-03-02 MED ORDER — ALBUTEROL SULFATE (2.5 MG/3ML) 0.083% IN NEBU
5.0000 mg | INHALATION_SOLUTION | Freq: Once | RESPIRATORY_TRACT | Status: AC
  Administered 2017-03-02: 5 mg via RESPIRATORY_TRACT
  Filled 2017-03-02: qty 6

## 2017-03-02 MED ORDER — FERROUS SULFATE 325 (65 FE) MG PO TABS: 325.0000 mg | ORAL_TABLET | Freq: Three times a day (TID) | ORAL | 0 refills | Status: DC

## 2017-03-02 MED ORDER — BUDESONIDE-FORMOTEROL FUMARATE 160-4.5 MCG/ACT IN AERO: 2.0000 | INHALATION_SPRAY | Freq: Two times a day (BID) | RESPIRATORY_TRACT | 0 refills | Status: DC

## 2017-03-02 MED ORDER — METHYLPREDNISOLONE SODIUM SUCC 125 MG IJ SOLR
125.0000 mg | Freq: Once | INTRAMUSCULAR | Status: AC
  Administered 2017-03-02: 125 mg via INTRAVENOUS
  Filled 2017-03-02: qty 2

## 2017-03-02 MED ORDER — TIOTROPIUM BROMIDE MONOHYDRATE 2.5 MCG/ACT IN AERS: INHALATION_SPRAY | RESPIRATORY_TRACT | 0 refills | Status: DC

## 2017-03-02 MED ORDER — ALBUTEROL (5 MG/ML) CONTINUOUS INHALATION SOLN
10.0000 mg/h | INHALATION_SOLUTION | Freq: Once | RESPIRATORY_TRACT | Status: AC
  Administered 2017-03-02: 10 mg/h via RESPIRATORY_TRACT
  Filled 2017-03-02: qty 20

## 2017-03-02 MED ORDER — PREDNISONE 20 MG PO TABS: ORAL_TABLET | ORAL | 0 refills | Status: DC

## 2017-03-02 NOTE — ED Notes (Signed)
Patient's pulse ox was 91% to 93% while ambulating.  Stayed consistently at 92%.

## 2017-03-02 NOTE — Discharge Instructions (Signed)
Restart your symbicort and spiriva inhalers. Use your nebulizer as needed every 4-6 hrs for shortness of breath. Take the prednisone until gone. Take the Zpak until gone. Follow up with Dr Kelby Fam to discuss your memory problems. Keep your appointment with Dr Darrick Penna to get your colonoscopy. Take the iron pills 3 times a day.   Return to the ED if you get worse, such as fever, struggling to breathe, getting weaker.

## 2017-03-02 NOTE — ED Triage Notes (Signed)
Daughter states that the patient has been experiencing a lot of confusion, short term memory loss and it is bothering her a lot.  He was in the hospital before so he could have tests completed, but he left before he was finished.  Daughter states that he had a lot of rectal bleeding before.  Patient states that he is short of breath today and has been using his medications for breathing at home.  Patient has a history of COPD.

## 2017-03-05 ENCOUNTER — Encounter (HOSPITAL_COMMUNITY): Payer: Self-pay | Admitting: Psychiatry

## 2017-03-05 ENCOUNTER — Other Ambulatory Visit (HOSPITAL_COMMUNITY): Payer: Self-pay | Admitting: Psychiatry

## 2017-03-05 ENCOUNTER — Ambulatory Visit (INDEPENDENT_AMBULATORY_CARE_PROVIDER_SITE_OTHER): Payer: Medicare Other | Admitting: Psychiatry

## 2017-03-05 VITALS — BP 113/78 | HR 68 | Ht 70.0 in | Wt 151.8 lb

## 2017-03-05 DIAGNOSIS — F332 Major depressive disorder, recurrent severe without psychotic features: Secondary | ICD-10-CM

## 2017-03-05 DIAGNOSIS — Z79899 Other long term (current) drug therapy: Secondary | ICD-10-CM

## 2017-03-05 DIAGNOSIS — Z818 Family history of other mental and behavioral disorders: Secondary | ICD-10-CM

## 2017-03-05 DIAGNOSIS — F17210 Nicotine dependence, cigarettes, uncomplicated: Secondary | ICD-10-CM | POA: Diagnosis not present

## 2017-03-05 MED ORDER — CLONAZEPAM 0.5 MG PO TABS: 0.5000 mg | ORAL_TABLET | Freq: Two times a day (BID) | ORAL | 2 refills | Status: DC

## 2017-03-05 MED ORDER — TEMAZEPAM 30 MG PO CAPS: 30.0000 mg | ORAL_CAPSULE | Freq: Every evening | ORAL | 2 refills | Status: DC | PRN

## 2017-03-05 MED ORDER — DIVALPROEX SODIUM 250 MG PO DR TAB: 750.0000 mg | DELAYED_RELEASE_TABLET | Freq: Every day | ORAL | 2 refills | Status: DC

## 2017-03-05 MED ORDER — CLONAZEPAM 0.5 MG PO TABS
0.5000 mg | ORAL_TABLET | Freq: Two times a day (BID) | ORAL | 2 refills | Status: DC
Start: 2017-03-05 — End: 2017-03-05

## 2017-03-05 MED ORDER — FLUOXETINE HCL 40 MG PO CAPS: 40.0000 mg | ORAL_CAPSULE | ORAL | 2 refills | Status: DC

## 2017-03-05 MED ORDER — OLANZAPINE 5 MG PO TABS: 5.0000 mg | ORAL_TABLET | Freq: Every day | ORAL | 2 refills | Status: DC
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
## 2017-03-08 ENCOUNTER — Telehealth (HOSPITAL_COMMUNITY): Payer: Self-pay | Admitting: *Deleted

## 2017-03-08 NOTE — Telephone Encounter (Signed)
voice message from patient, said he is returning phone call.

## 2017-03-09 ENCOUNTER — Encounter: Payer: Self-pay | Admitting: Gastroenterology

## 2017-03-09 ENCOUNTER — Ambulatory Visit (INDEPENDENT_AMBULATORY_CARE_PROVIDER_SITE_OTHER): Payer: Medicare Other | Admitting: Gastroenterology

## 2017-03-09 VITALS — BP 111/65 | HR 57 | Temp 97.9°F | Ht 70.0 in | Wt 151.8 lb

## 2017-03-09 DIAGNOSIS — D500 Iron deficiency anemia secondary to blood loss (chronic): Secondary | ICD-10-CM

## 2017-03-09 DIAGNOSIS — K921 Melena: Secondary | ICD-10-CM | POA: Diagnosis not present

## 2017-03-09 MED ORDER — PANTOPRAZOLE SODIUM 40 MG PO TBEC: 40.0000 mg | DELAYED_RELEASE_TABLET | Freq: Every day | ORAL | 5 refills | Status: DC

## 2017-03-09 NOTE — Progress Notes (Signed)
CC'ED TO PCP 

## 2017-03-09 NOTE — Progress Notes (Signed)
Primary Care Physician: Pamelia Hoit, MD  Primary Gastroenterologist:  Roetta Sessions, MD   Chief Complaint  Patient presents with  . Rectal Bleeding  . Anemia    HPI: Jeffrey [name] is a 56 y.o. male here for follow-up. He was seen in the hospital back in March with worsening shortness of breath. Reported red blood in stool for 3 years but no melena. Stools "dark". Takes aspirin 81 mg daily. Admitting hemoglobin was 7.6, MCV in the 60s, anemia labs consistent with IDA. He was transfused. EGD during admission revealed nonobstructing Schatzki ring, small nonbleeding erosions in the stomach, mild duodenitis. Pathology benign, question ASA/NSAID related. Advised PPI but patient states he could never find the prescription at discharge. No H. pylori or celiac disease on biopsies. Last colonoscopy 2014 with internal and external hemorrhoids, unable to intubate the terminal ileum, segmental biopsies unremarkable. Prior to discharge his hemoglobin was 9.3. On April 10 when he went to the ED with shortness of breath, his hemoglobin was 9.9, hematocrit 32.2, MCV 66.4. Chest x-ray revealed mild vascular congestion, mild interstitial prominence may reflect mild interstitial edema.  Rectal bleeding once every couple of days. Quarter sized clots, and fresh blood. Going on couple of years. Sometimes a lot of blood. After started taking iron, stools are hard. Stopped iron couple of days ago. No rectal pain or stomach pain. Nerve in back if hits with wiping butt then sharp pain shoots up. No hb, some indigestion. No dysphagia. No nsaids except maybe one ibuprofen or tylenol for headache every 3 weeks. Takes asa  daily.  Current Outpatient Prescriptions  Medication Sig Dispense Refill  . albuterol (PROAIR HFA) 108 (90 Base) MCG/ACT inhaler Inhale 2 puffs into the lungs every 4 (four) hours as needed for wheezing or shortness of breath. 1 Inhaler 3  . albuterol (PROVENTIL) (2.5 MG/3ML) 0.083%  nebulizer solution Take 3 mLs (2.5 mg total) by nebulization every 4 (four) hours as needed for shortness of breath. 120 mL 3  . aspirin EC 81 MG tablet Take 81 mg by mouth daily.    Marland Kitchen atorvastatin (LIPITOR) 20 MG tablet Take 20 mg by mouth daily.     . budesonide-formoterol (SYMBICORT) 160-4.5 MCG/ACT inhaler Inhale 2 puffs into the lungs 2 (two) times daily. 10.2 g 0  . clonazePAM (KLONOPIN) 0.5 MG tablet Take 1 tablet (0.5 mg total) by mouth 2 (two) times daily. 60 tablet 2  . divalproex (DEPAKOTE) 250 MG DR tablet Take 3 tablets (750 mg total) by mouth at bedtime. 270 tablet 2  . fenofibrate micronized (LOFIBRA) 134 MG capsule Take 134 mg by mouth daily before breakfast.     . ferrous sulfate 325 (65 FE) MG tablet Take 1 tablet (325 mg total) by mouth 3 (three) times daily with meals. 90 tablet 0  . FLUoxetine (PROZAC) 40 MG capsule Take 1 capsule (40 mg total) by mouth every morning. 90 capsule 2  . metoprolol (LOPRESSOR) 50 MG tablet Take 50 mg by mouth 2 (two) times daily.    Marland Kitchen OLANZapine (ZYPREXA) 5 MG tablet Take 1 tablet (5 mg total) by mouth at bedtime. 90 tablet 2  . temazepam (RESTORIL) 30 MG capsule Take 1 capsule (30 mg total) by mouth at bedtime as needed for sleep. 60 capsule 2  . temazepam (RESTORIL) 30 MG capsule Take 1 capsule (30 mg total) by mouth at bedtime as needed for sleep. 30 capsule 2  . Tiotropium Bromide Monohydrate (SPIRIVA RESPIMAT) 2.5 MCG/ACT AERS  INHALE 2 PUFFS INTO THE LUNGS ONCE DAILY 4 g 0  . pantoprazole (PROTONIX) 40 MG tablet Take 1 tablet (40 mg total) by mouth daily before breakfast. (Patient not taking: Reported on 03/09/2017) 30 tablet 5   No current facility-administered medications for this visit.     Allergies as of 03/09/2017 - Review Complete 03/09/2017  Allergen Reaction Noted  . Augmentin [amoxicillin-pot clavulanate] Rash and Other (See Comments) 12/16/2011  . Ace inhibitors Hives 05/05/2013   Past Medical History:  Diagnosis Date  .  Acute blood loss anemia 02/07/2017  . Anxiety   . Arthritis    deg disease, bulging disk,  shoulder level  . Bipolar disorder (HCC)   . Bipolar disorder (HCC)   . 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 Depression    anxiety  . Hyperlipidemia   . Peptic ulcer disease    Review  . Pneumonia   . [name] (peripheral vascular disease) (HCC) 06/18/2015  . Shortness of breath    Past Surgical History:  Procedure Laterality Date  . BIOPSY  02/09/2017   Procedure: BIOPSY;  Surgeon: [name], MD;  Location: [facility];  Service: Endoscopy;;  duodenal gastric  . COLONOSCOPY  03/14/2007   [id number] colonoscopy and terminal ileoscopy except external hemorrhoids  . COLONOSCOPY N/A 05/05/2013   [name]: external/internal anal canal hemorrhoids, unable to intubate TI, segemental biopsies unremarkable   . ESOPHAGOGASTRODUODENOSCOPY  03/14/2007   [id number] antral gastritis with bulbar duodenitis/paucity to postbulbar duodenal folds and biopsy were benign with no evidence of villous atrophy.  . ESOPHAGOGASTRODUODENOSCOPY (EGD) WITH PROPOFOL N/A 02/09/2017   Procedure: ESOPHAGOGASTRODUODENOSCOPY (EGD) WITH PROPOFOL;  Surgeon: [name], MD;  Location: [facility];  Service: Endoscopy;  Laterality: N/A;  . HAND SURGERY     left, secondary to self-inflicted laceration  . SHOULDER SURGERY     right  . TOE SURGERY     left great toe , amputated-lawnmover accident   Family History  Problem Relation Age of Onset  . Breast cancer Mother     deceased  . Heart disease Father   . Depression Daughter   . Anxiety disorder Daughter   . Anxiety disorder Son   . Depression Son   . Asthma Brother   . Heart attack Maternal Aunt   . Heart attack Maternal Uncle   . Heart attack Paternal Aunt   . Heart attack Paternal Uncle   . Heart attack Maternal Grandmother   . Heart attack Maternal Grandfather   . Emphysema Maternal Grandfather   . Heart attack Paternal Grandmother   . Heart attack  Paternal Grandfather   . Colon cancer Neg Hx   . Liver disease Neg Hx    Social History   Social History  . Marital status: Divorced    Spouse name: N/A  . Number of children: 2  . Years of education: N/A   Occupational History  . Disabled    Social History Main Topics  . Smoking status: Current Every Day Smoker    Packs/day: 0.50    Years: 40.00    Types: Cigarettes    Start date: 08/04/1971  . Smokeless tobacco: Never Used     Comment: peak rate of 2.5ppd, 1/2ppd on 11/27/2016  . Alcohol use No  . Drug use: Yes    Types: Marijuana     Comment: most days   . Sexual activity: No   Other Topics Concern  . None   Social History Narrative  Originally from [facility]. Previously has lived in [place] [facility] [place] 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 Questionable asbestos exposure. Does have significant exposure to fumes. No mold exposure. No bird exposure. No pets currently.     ROS:  General: Negative for anorexia, weight loss, fever, chills, fatigue, weakness. ENT: Negative for hoarseness, difficulty swallowing , nasal congestion. CV: Negative for chest pain, angina, palpitations, dyspnea on exertion, peripheral edema.  Respiratory: Negative for dyspnea at rest, dyspnea on exertion, cough, sputum, wheezing.  GI: See history of present illness. GU:  Negative for dysuria, hematuria, urinary incontinence, urinary frequency, nocturnal urination.  Endo: Negative for unusual weight change.    Physical Examination:   BP 111/65   Pulse (!) 57   Temp 97.9 F (36.6 C) (Oral)   Ht  (1.778 m)   Wt 151 lb 12.8 oz (68.9 kg)   BMI 21.78 kg/m   General: Well-nourished, well-developed in no acute distress. Appears older than stated age Eyes: No icterus. Mouth: Oropharyngeal mucosa moist and pink , no lesions erythema or exudate. Lungs: Clear to auscultation bilaterally.  Heart: Regular rate and rhythm, no murmurs rubs or  gallops.  Abdomen: Bowel sounds are normal, nontender, nondistended, no hepatosplenomegaly or masses, no abdominal bruits or hernia , no rebound or guarding.   Extremities: No lower extremity edema. No clubbing or deformities. Neuro: Alert and oriented x 4   Skin: Warm and dry, no jaundice.   Psych: Alert and cooperative, normal mood and affect.  Labs:  Lab Results  Component Value Date   CREATININE 1.00 03/02/2017   BUN 12 03/02/2017   NA 133 (L) 03/02/2017   K 3.8 03/02/2017   CL 98 (L) 03/02/2017   CO2 27 03/02/2017   Lab Results  Component Value Date   ALT 12 (L) 03/02/2017   AST 22 03/02/2017   ALKPHOS 81 03/02/2017   BILITOT 0.2 (L) 03/02/2017   Lab Results  Component Value Date   WBC 5.8 03/02/2017   HGB 9.9 (L) 03/02/2017   HCT 32.2 (L) 03/02/2017   MCV 66.4 (L) 03/02/2017   PLT 415 (H) 03/02/2017   Lab Results  Component Value Date   IRON 14 (L) 02/08/2017   TIBC 519 (H) 02/08/2017   FERRITIN 6 (L) 02/08/2017   Lab Results  Component Value Date   VITAMINB12 287 02/08/2017   Lab Results  Component Value Date   FOLATE 7.5 02/08/2017    Imaging Studies: Dg Chest 2 View  Result Date: 03/02/2017 CLINICAL DATA:  Acute onset of shortness of breath and wheezing. Confusion. Initial encounter. EXAM: CHEST  2 VIEW COMPARISON:  Chest radiograph performed 02/07/2017 FINDINGS: The lungs are well-aerated. Mild vascular congestion is noted. Mild interstitial prominence may reflect mild interstitial edema. There is no evidence of pleural effusion or pneumothorax. The heart is normal in size; the mediastinal contour is within normal limits. No acute osseous abnormalities are seen. IMPRESSION: Mild vascular congestion. Mild interstitial prominence may reflect mild interstitial edema. Electronically Signed   By: Roanna Raider M.D.   On: 03/02/2017 01:31   Dg Chest 2 View  Result Date: 02/07/2017 CLINICAL DATA:  56 year old male with weakness. EXAM: CHEST  2 VIEW  COMPARISON:  Stop chest radiograph dated 11/27/2016 FINDINGS: Mild underlying COPD with a trapping and hyperexpansion of the lungs and flattening of the diaphragms. Bilateral perihilar vascular prominence and hazy densities noted which are more prominent compared to the  prior radiograph and may represent mild vascular congestion. Superimposed pneumonia is not excluded. There is no pleural effusion or pneumothorax. The cardiac silhouette is within normal limits. No acute osseous pathology identified. IMPRESSION: Perihilar hazy densities likely mild vascular congestion. Pneumonia is not excluded. Clinical correlation and follow-up recommended. Electronically Signed   By: Elgie Collard M.D.   On: 02/07/2017 18:42   Ct Head Wo Contrast  Result Date: 02/07/2017 CLINICAL DATA:  56 year old male with difficulty speaking. EXAM: CT HEAD WITHOUT CONTRAST TECHNIQUE: Contiguous axial images were obtained from the base of the skull through the vertex without intravenous contrast. COMPARISON:  None FINDINGS: Brain: The ventricles and sulci are appropriate in size for patient's age. The gray-white matter discrimination is preserved. There is no acute intracranial hemorrhage. No mass effect or midline shift noted. No intra-axial fluid collection. Vascular: No hyperdense vessel or unexpected calcification. Skull: Normal. Negative for fracture or focal lesion. Sinuses/Orbits: No acute finding. Other: None IMPRESSION: No acute intracranial pathology. If symptoms persist and there are no contraindications, MRI may provide better evaluation if clinically indicated. Electronically Signed   By: Elgie Collard M.D.   On: 02/07/2017 18:48

## 2017-03-09 NOTE — Telephone Encounter (Signed)
Called pt to get more information as to previous message. Per pt in previous phone call, someone called him and he was returning call. Spoke with pt to get more details. Per pt chart, it does not appear RMA called pt. RMA asked pt if he listen to his voicemail again to get the name of the person that called him. Per pt he do not know how to get into his voicemail. RMA informed pt that she don't remember calling pt and if pt could call that missed number back to see who called him. Pt verbalized understanding and agreed.

## 2017-03-09 NOTE — Patient Instructions (Signed)
1. I have sent a prescription for pantoprazole to [facility]. Please take once daily before breakfast to heal the lining of your stomach.  2. I will discuss further work up with [name] and let you know his recommendations. You will likely need a colonoscopy as the next step.

## 2017-03-09 NOTE — Assessment & Plan Note (Signed)
56 year old gentleman presenting for hospital follow-up. Recent admission for worsening shortness of breath, found to have significant iron deficiency anemia. Transfuse 2 units of packed blood cells. EGD with gastritis/duodenitis possibly related to aspirin use without PPI use. He also has significant chronic rectal bleeding, describes moderate to large volume rectal bleeding associated with blood clots on a regular basis. Frequently passes blood and no stool. Denies constipation. He has known hemorrhoids at time of colonoscopy nearly 4 years ago. I suspect we will need to update his colonoscopy prior to [facility] capsule endoscopy as previously suggested when patient declined colonoscopy in the hospital. I will discuss further with [name].

## 2017-03-10 NOTE — Progress Notes (Signed)
Please let patient know that RMR recommends colonoscopy in OR to evaluate IDA, rectal bleeding.  Hold iron 7 days.

## 2017-03-11 ENCOUNTER — Other Ambulatory Visit: Payer: Self-pay

## 2017-03-11 DIAGNOSIS — K625 Hemorrhage of anus and rectum: Secondary | ICD-10-CM

## 2017-03-11 DIAGNOSIS — D509 Iron deficiency anemia, unspecified: Secondary | ICD-10-CM

## 2017-03-11 MED ORDER — PEG 3350-KCL-NA BICARB-NACL 420 G PO SOLR: 4000.0000 mL | ORAL | 0 refills | Status: DC

## 2017-03-11 NOTE — Progress Notes (Signed)
Called pt and informed of RMR's recommendation. He's ok with having TCS. TCS with Propofol scheduled for 03/31/17 at 1:45pm. Rx for prep sent to pharmacy. Advised pt to hold Iron starting 03/24/17 and noted on instructions. Orders entered. Pre-op appt 03/29/17 at 3:00pm. Tried to call pt back to inform him of pre-op appt, no answer and VM hasn't been set-up. Letter with pre-op included with procedure instructions and mailed to pt.  PA for TCS submitted via [facility] website. No PA needed. Decision ID# [id number].

## 2017-03-11 NOTE — Progress Notes (Signed)
I have sent the pt a message in mychart. Please schedule procedure.

## 2017-03-24 NOTE — Patient Instructions (Signed)
Jeffrey [name]  03/24/2017     @   Your procedure is scheduled on 03/31/2017.  Report to Jeffrey [name] at 12:15 P.M.  Call this number if you have problems the morning of surgery:  [phone]   Remember:  Do not eat food or drink liquids after midnight.  Take these medicines the morning of surgery with A SIP OF WATER Albuterol inhaler and bring with you, Albuterol neb, Symbicort, Klonopin, Depakote  Prozac, Metoprolol, Zyprexa, Protonix, Spiriva   Do not wear jewelry, make-up or nail polish.  Do not wear lotions, powders, or perfumes, or deoderant.  Do not shave 48 hours prior to surgery.  Men may shave face and neck.  Do not bring valuables to the hospital.  [facility] is not responsible for any belongings or valuables.  Contacts, dentures or bridgework may not be worn into surgery.  Leave your suitcase in the car.  After surgery it may be brought to your room.  For patients admitted to the hospital, discharge time will be determined by your treatment team.  Patients discharged the day of surgery will not be allowed to drive home.    Please read over the following fact sheets that you were given. Anesthesia Post-op Instructions     PATIENT INSTRUCTIONS POST-ANESTHESIA  IMMEDIATELY FOLLOWING SURGERY:  Do not drive or operate machinery for the first twenty four hours after surgery.  Do not make any important decisions for twenty four hours after surgery or while taking narcotic pain medications or sedatives.  If you develop intractable nausea and vomiting or a severe headache please notify your doctor immediately.  FOLLOW-UP:  Please make an appointment with your surgeon as instructed. You do not need to follow up with anesthesia unless specifically instructed to do so.  WOUND CARE INSTRUCTIONS (if applicable):  Keep a dry clean dressing on the anesthesia/puncture wound site if there is drainage.  Once the wound has quit draining you may leave it open to  air.  Generally you should leave the bandage intact for twenty four hours unless there is drainage.  If the epidural site drains for more than 36-48 hours please call the anesthesia department.  QUESTIONS?:  Please feel free to call your physician or the hospital operator if you have any questions, and they will be happy to assist you.      Colonoscopy, Adult A colonoscopy is an exam to look at the entire large intestine. During the exam, a lubricated, bendable tube is inserted into the anus and then passed into the rectum, colon, and other parts of the large intestine. A colonoscopy is often done as a part of normal colorectal screening or in response to certain symptoms, such as anemia, persistent diarrhea, abdominal pain, and blood in the stool. The exam can help screen for and diagnose medical problems, including:  Tumors.  Polyps.  Inflammation.  Areas of bleeding. Tell a health care provider about:  Any allergies you have.  All medicines you are taking, including vitamins, herbs, eye drops, creams, and over-the-counter medicines.  Any problems you or family members have had with anesthetic medicines.  Any blood disorders you have.  Any surgeries you have had.  Any medical conditions you have.  Any problems you have had passing stool. What are the risks? Generally, this is a safe procedure. However, problems may occur, including:  Bleeding.  A tear in the intestine.  A reaction to medicines given during the exam.  Infection (rare). What happens before the  procedure? Eating and drinking restrictions  Follow instructions from your health care provider about eating and drinking, which may include:  A few days before the procedure - follow a low-fiber diet. Avoid nuts, seeds, dried fruit, raw fruits, and vegetables.  1-3 days before the procedure - follow a clear liquid diet. Drink only clear liquids, such as clear broth or bouillon, black coffee or tea, clear juice,  clear soft drinks or sports drinks, gelatin dessert, and popsicles. Avoid any liquids that contain red or purple dye.  On the day of the procedure - do not eat or drink anything during the 2 hours before the procedure, or within the time period that your health care provider recommends. Bowel prep  If you were prescribed an oral bowel prep to clean out your colon:  Take it as told by your health care provider. Starting the day before your procedure, you will need to drink a large amount of medicated liquid. The liquid will cause you to have multiple loose stools until your stool is almost clear or light green.  If your skin or anus gets irritated from diarrhea, you may use these to relieve the irritation:  Medicated wipes, such as adult wet wipes with aloe and vitamin E.  A skin soothing-product like petroleum jelly.  If you vomit while drinking the bowel prep, take a break for up to 60 minutes and then begin the bowel prep again. If vomiting continues and you cannot take the bowel prep without vomiting, call your health care provider. General instructions   Ask your health care provider about changing or stopping your regular medicines. This is especially important if you are taking diabetes medicines or blood thinners.  Plan to have someone take you home from the hospital or clinic. What happens during the procedure?  An IV tube may be inserted into one of your veins.  You will be given medicine to help you relax (sedative).  To reduce your risk of infection:  Your health care team will wash or sanitize their hands.  Your anal area will be washed with soap.  You will be asked to lie on your side with your knees bent.  Your health care provider will lubricate a long, thin, flexible tube. The tube will have a camera and a light on the end.  The tube will be inserted into your anus.  The tube will be gently eased through your rectum and colon.  Air will be delivered into your  colon to keep it open. You may feel some pressure or cramping.  The camera will be used to take images during the procedure.  A small tissue sample may be removed from your body to be examined under a microscope (biopsy). If any potential problems are found, the tissue will be sent to a lab for testing.  If small polyps are found, your health care provider may remove them and have them checked for cancer cells.  The tube that was inserted into your anus will be slowly removed. The procedure may vary among health care providers and hospitals. What happens after the procedure?  Your blood pressure, heart rate, breathing rate, and blood oxygen level will be monitored until the medicines you were given have worn off.  Do not drive for 24 hours after the exam.  You may have a small amount of blood in your stool.  You may pass gas and have mild abdominal cramping or bloating due to the air that was used to inflate your  colon during the exam.  It is up to you to get the results of your procedure. Ask your health care provider, or the department performing the procedure, when your results will be ready. This information is not intended to replace advice given to you by your health care provider. Make sure you discuss any questions you have with your health care provider. Document Released: 11/06/2000 Document Revised: 09/09/2016 Document Reviewed: 01/21/2016 Elsevier Interactive Patient Education  2017 [employer].

## 2017-03-29 ENCOUNTER — Encounter (HOSPITAL_COMMUNITY)
Admission: RE | Admit: 2017-03-29 | Discharge: 2017-03-29 | Disposition: A | Discharge status: Home / Self Care | Payer: Medicare Other | Source: Ambulatory Visit | Attending: Internal Medicine | Admitting: Internal Medicine

## 2017-03-29 ENCOUNTER — Encounter (HOSPITAL_COMMUNITY): Payer: Self-pay

## 2017-03-29 DIAGNOSIS — F319 Bipolar disorder, unspecified: Secondary | ICD-10-CM | POA: Diagnosis not present

## 2017-03-29 DIAGNOSIS — J449 Chronic obstructive pulmonary disease, unspecified: Secondary | ICD-10-CM | POA: Diagnosis not present

## 2017-03-29 DIAGNOSIS — D649 Anemia, unspecified: Secondary | ICD-10-CM | POA: Diagnosis not present

## 2017-03-29 DIAGNOSIS — K921 Melena: Secondary | ICD-10-CM | POA: Diagnosis not present

## 2017-03-29 DIAGNOSIS — F419 Anxiety disorder, unspecified: Secondary | ICD-10-CM | POA: Diagnosis not present

## 2017-03-29 DIAGNOSIS — I10 Essential (primary) hypertension: Secondary | ICD-10-CM | POA: Diagnosis not present

## 2017-03-29 DIAGNOSIS — F17200 Nicotine dependence, unspecified, uncomplicated: Secondary | ICD-10-CM | POA: Diagnosis not present

## 2017-03-29 DIAGNOSIS — K641 Second degree hemorrhoids: Secondary | ICD-10-CM | POA: Diagnosis not present

## 2017-03-29 DIAGNOSIS — I739 Peripheral vascular disease, unspecified: Secondary | ICD-10-CM | POA: Diagnosis not present

## 2017-03-29 HISTORY — DX: Essential (primary) hypertension: I10

## 2017-03-29 LAB — BASIC METABOLIC PANEL
Anion gap: 8 (ref 5–15)
BUN: 9 mg/dL (ref 6–20)
CO2: 28 mmol/L (ref 22–32)
Calcium: 9 mg/dL (ref 8.9–10.3)
Chloride: 99 mmol/L — ABNORMAL LOW (ref 101–111)
Creatinine, Ser: 0.98 mg/dL (ref 0.61–1.24)
GFR calc Af Amer: >60 mL/min (ref >60)
GFR calc non Af Amer: >60 mL/min (ref >60)
Glucose, Bld: 115 mg/dL — ABNORMAL HIGH (ref 65–99)
Potassium: 4.1 mmol/L (ref 3.5–5.1)
Sodium: 135 mmol/L (ref 135–145)

## 2017-03-29 LAB — CBC WITH DIFFERENTIAL/PLATELET
Basophils Absolute: 0.1 10*3/uL (ref 0.0–0.1)
Basophils Relative: 1 %
Eosinophils Absolute: 0.1 10*3/uL (ref 0.0–0.7)
Eosinophils Relative: 2 %
HCT: 37.9 % — ABNORMAL LOW (ref 39.0–52.0)
Hemoglobin: 11.8 g/dL — ABNORMAL LOW (ref 13.0–17.0)
Lymphocytes Relative: 28 %
Lymphs Abs: 1.4 10*3/uL (ref 0.7–4.0)
MCH: 21.8 pg — ABNORMAL LOW (ref 26.0–34.0)
MCHC: 31.1 g/dL (ref 30.0–36.0)
MCV: 69.9 fL — ABNORMAL LOW (ref 78.0–100.0)
Monocytes Absolute: 0.3 10*3/uL (ref 0.1–1.0)
Monocytes Relative: 6 %
Neutro Abs: 3.2 10*3/uL (ref 1.7–7.7)
Neutrophils Relative %: 63 %
Platelets: 337 10*3/uL (ref 150–400)
RBC: 5.42 MIL/uL (ref 4.22–5.81)
RDW: 23.6 % — ABNORMAL HIGH (ref 11.5–15.5)
WBC: 5 10*3/uL (ref 4.0–10.5)

## 2017-03-31 ENCOUNTER — Ambulatory Visit (HOSPITAL_COMMUNITY): Admit: 2017-03-31 | Payer: Medicare Other | Admitting: Internal Medicine

## 2017-03-31 ENCOUNTER — Encounter (HOSPITAL_COMMUNITY)
Admission: RE | Disposition: A | Discharge status: Home / Self Care | Payer: Self-pay | Source: Ambulatory Visit | Attending: Internal Medicine

## 2017-03-31 ENCOUNTER — Encounter (HOSPITAL_COMMUNITY): Payer: Self-pay

## 2017-03-31 ENCOUNTER — Ambulatory Visit (HOSPITAL_COMMUNITY): Payer: Medicare Other | Admitting: Anesthesiology

## 2017-03-31 ENCOUNTER — Encounter (HOSPITAL_COMMUNITY): Payer: Self-pay | Admitting: *Deleted

## 2017-03-31 ENCOUNTER — Ambulatory Visit (HOSPITAL_COMMUNITY)
Admission: RE | Admit: 2017-03-31 | Discharge: 2017-03-31 | Disposition: A | Discharge status: Home / Self Care | Payer: Medicare Other | Source: Ambulatory Visit | Attending: Internal Medicine | Admitting: Internal Medicine

## 2017-03-31 ENCOUNTER — Telehealth: Payer: Self-pay | Admitting: Internal Medicine

## 2017-03-31 DIAGNOSIS — K641 Second degree hemorrhoids: Secondary | ICD-10-CM | POA: Insufficient documentation

## 2017-03-31 DIAGNOSIS — J449 Chronic obstructive pulmonary disease, unspecified: Secondary | ICD-10-CM | POA: Diagnosis not present

## 2017-03-31 DIAGNOSIS — K625 Hemorrhage of anus and rectum: Secondary | ICD-10-CM

## 2017-03-31 DIAGNOSIS — K921 Melena: Secondary | ICD-10-CM | POA: Diagnosis not present

## 2017-03-31 DIAGNOSIS — F419 Anxiety disorder, unspecified: Secondary | ICD-10-CM | POA: Insufficient documentation

## 2017-03-31 DIAGNOSIS — I10 Essential (primary) hypertension: Secondary | ICD-10-CM | POA: Insufficient documentation

## 2017-03-31 DIAGNOSIS — D509 Iron deficiency anemia, unspecified: Secondary | ICD-10-CM

## 2017-03-31 DIAGNOSIS — F17200 Nicotine dependence, unspecified, uncomplicated: Secondary | ICD-10-CM | POA: Insufficient documentation

## 2017-03-31 DIAGNOSIS — I739 Peripheral vascular disease, unspecified: Secondary | ICD-10-CM | POA: Insufficient documentation

## 2017-03-31 DIAGNOSIS — F319 Bipolar disorder, unspecified: Secondary | ICD-10-CM | POA: Insufficient documentation

## 2017-03-31 DIAGNOSIS — D649 Anemia, unspecified: Secondary | ICD-10-CM | POA: Insufficient documentation

## 2017-03-31 HISTORY — PX: COLONOSCOPY WITH PROPOFOL: SHX5780

## 2017-03-31 SURGERY — COLONOSCOPY
Anesthesia: Moderate Sedation

## 2017-03-31 SURGERY — COLONOSCOPY WITH PROPOFOL
Anesthesia: Monitor Anesthesia Care

## 2017-03-31 MED ORDER — LACTATED RINGERS IV SOLN
INTRAVENOUS | Status: DC
  Administered 2017-03-31: 13:00:00 via INTRAVENOUS

## 2017-03-31 MED ORDER — MIDAZOLAM HCL 2 MG/2ML IJ SOLN
1.0000 mg | INTRAMUSCULAR | Status: AC
  Administered 2017-03-31: 2 mg via INTRAVENOUS
  Filled 2017-03-31: qty 2

## 2017-03-31 MED ORDER — PROPOFOL 10 MG/ML IV BOLUS
INTRAVENOUS | Status: AC
  Filled 2017-03-31: qty 20

## 2017-03-31 MED ORDER — GLYCOPYRROLATE 0.2 MG/ML IJ SOLN
0.2000 mg | Freq: Once | INTRAMUSCULAR | Status: AC
  Administered 2017-03-31: 0.2 mg via INTRAVENOUS

## 2017-03-31 MED ORDER — PROPOFOL 10 MG/ML IV BOLUS
INTRAVENOUS | Status: DC | PRN
  Administered 2017-03-31: 20 mg via INTRAVENOUS

## 2017-03-31 MED ORDER — FENTANYL CITRATE (PF) 100 MCG/2ML IJ SOLN
INTRAMUSCULAR | Status: AC
  Filled 2017-03-31: qty 2

## 2017-03-31 MED ORDER — FENTANYL CITRATE (PF) 100 MCG/2ML IJ SOLN
25.0000 ug | Freq: Once | INTRAMUSCULAR | Status: AC
  Administered 2017-03-31: 25 ug via INTRAVENOUS

## 2017-03-31 MED ORDER — CHLORHEXIDINE GLUCONATE CLOTH 2 % EX PADS: 6.0000 | MEDICATED_PAD | Freq: Once | CUTANEOUS | Status: DC

## 2017-03-31 MED ORDER — GLYCOPYRROLATE 0.2 MG/ML IJ SOLN
INTRAMUSCULAR | Status: AC
  Filled 2017-03-31: qty 1

## 2017-03-31 MED ORDER — PROPOFOL 500 MG/50ML IV EMUL
INTRAVENOUS | Status: DC | PRN
  Administered 2017-03-31: 150 ug/kg/min via INTRAVENOUS

## 2017-03-31 MED ORDER — GLYCOPYRROLATE 0.2 MG/ML IJ SOLN
0.2000 mg | Freq: Once | INTRAMUSCULAR | Status: AC | PRN
  Administered 2017-03-31: 0.2 mg via INTRAVENOUS
  Filled 2017-03-31: qty 1

## 2017-03-31 MED ORDER — IPRATROPIUM-ALBUTEROL 0.5-2.5 (3) MG/3ML IN SOLN
3.0000 mL | Freq: Once | RESPIRATORY_TRACT | Status: AC
  Administered 2017-03-31: 3 mL via RESPIRATORY_TRACT
  Filled 2017-03-31: qty 9

## 2017-03-31 NOTE — Interval H&P Note (Signed)
History and Physical Interval Note:  03/31/2017 1:24 PM  Jeffrey GriffonEdward T [name]  has presented today for surgery, with the diagnosis of IDA, rectal bleeding  The various methods of treatment have been discussed with the patient and family. After consideration of risks, benefits and other options for treatment, the patient has consented to  Procedure(s) with comments: COLONOSCOPY WITH PROPOFOL (N/A) - 1:45pm as a surgical intervention .  The patient's history has been reviewed, patient examined, no change in status, stable for surgery.  I have reviewed the patient's chart and labs.  Questions were answered to the patient's satisfaction.     No change. Diagnostic colonoscopy per plan.  The risks, benefits, limitations, alternatives and imponderables have been reviewed with the patient. Questions have been answered. All parties are agreeable.   Jeffrey [name]

## 2017-03-31 NOTE — H&P (View-Only) (Signed)
Please let patient know that RMR recommends colonoscopy in OR to evaluate IDA, rectal bleeding.  Hold iron 7 days.

## 2017-03-31 NOTE — Telephone Encounter (Signed)
Patient needs a small bowel capsule study and   consider hemorrhoid banding in the office in 3 weeks depending on capsule results. Per RMR procedure notes from 03/31/2017  Please advise when RMR wants to do banding. His next available in the office is July 3rd.

## 2017-03-31 NOTE — Op Note (Signed)
[facility] Patient Name: Jeffrey [name] Procedure Date: 03/31/2017 11:40 AM MRN: [id number] Date of Birth: 02/10/1961 Attending MD: Gennette Pac , MD CSN: [id number] Age: 56 Admit Type: Outpatient Procedure:                Colonoscopy Indications:              Hematochezia Providers:                Gennette Pac, MD, Jannett Celestine, RN, Nena Polio, RN Referring MD:              Medicines:                Propofol per Anesthesia Complications:            No immediate complications. Estimated Blood Loss:     Estimated blood loss: none. Procedure:                Pre-Anesthesia Assessment:                           - Prior to the procedure, a History and Physical                            was performed, and patient medications and                            allergies were reviewed. The patient's tolerance of                            previous anesthesia was also reviewed. The risks                            and benefits of the procedure and the sedation                            options and risks were discussed with the patient.                            All questions were answered, and informed consent                            was obtained. Prior Anticoagulants: The patient has                            taken no previous anticoagulant or antiplatelet                            agents. ASA Grade Assessment: III - A patient with                            severe systemic disease. After reviewing the risks                            and  benefits, the patient was deemed in                            satisfactory condition to undergo the procedure.                           After obtaining informed consent, the colonoscope                            was passed under direct vision. Throughout the                            procedure, the patient's blood pressure, pulse, and                            oxygen saturations were monitored  continuously. The                            EC-[id number] ([id number]) scope was introduced through                            the and advanced to the 5 cm into the ileum. The                            terminal ileum, ileocecal valve, appendiceal                            orifice, and rectum were photographed. The quality                            of the bowel preparation was adequate. Scope In: 1:47:37 PM Scope Out: 1:59:49 PM Scope Withdrawal Time: 0 hours 8 minutes 38 seconds  Total Procedure Duration: 0 hours 12 minutes 12 seconds  Findings:      The perianal and digital rectal examinations were normal.      Internal hemorrhoids were found during retroflexion. The hemorrhoids       were Grade II (internal hemorrhoids that prolapse but reduce       spontaneously).      The exam was otherwise without abnormality on direct and retroflexion       views. Impression:               - Internal hemorrhoids.                           - The examination was otherwise normal on direct                            and retroflexion views. The distal 5 cm of terminal                            ileal mucosa also appeared normal                           - No specimens collected. Patient recently had a  significant GI bleed. may be in large part                            hemorrhoidal in origin. However with the degree of                            anemia required transfusion and EGD findings, I                            feel that we should go ahead and study small bowel                            via a capsule prior to empirically banding his                            hemorrhoids. Moderate Sedation:      Moderate (conscious) sedation was personally administered by an       anesthesia professional. The following parameters were monitored: oxygen       saturation, heart rate, blood pressure, respiratory rate, EKG, adequacy       of pulmonary ventilation, and response to care.  Total physician       intraservice time was 19 minutes. Recommendation:           - Patient has a contact number available for                            emergencies. The signs and symptoms of potential                            delayed complications were discussed with the                            patient. Return to normal activities tomorrow.                            Written discharge instructions were provided to the                            patient.                           - Resume previous diet.                           - Continue present medications.                           - Repeat colonoscopy in 10 years for screening                            purposes. Small bowel capsule study.                           - consider hemorrhoid banding in the office in 3  weeks depending on capsule results. Procedure Code(s):        --- Professional ---                           [phone], Colonoscopy, flexible; diagnostic, including                            collection of specimen(s) by brushing or washing,                            when performed (separate procedure) Diagnosis Code(s):        --- Professional ---                           K64.1, Second degree hemorrhoids                           K92.1, Melena (includes Hematochezia) CPT copyright 2016 [employer]. All rights reserved. The codes documented in this report are preliminary and upon coder review may  be revised to meet current compliance requirements. Gerrit Friends. Gerod Caligiuri, MD Gennette Pac, MD 03/31/2017 2:13:24 PM This report has been signed electronically. Number of Addenda: 0

## 2017-03-31 NOTE — Discharge Instructions (Addendum)
°  Colonoscopy Discharge Instructions  Read the instructions outlined below and refer to this sheet in the next few weeks. These discharge instructions provide you with general information on caring for yourself after you leave the hospital. Your doctor may also give you specific instructions. While your treatment has been planned according to the most current medical practices available, unavoidable complications occasionally occur. If you have any problems or questions after discharge, call Dr. Jena Gaussourk at [phone]. ACTIVITY  You may resume your regular activity, but move at a slower pace for the next 24 hours.   Take frequent rest periods for the next 24 hours.   Walking will help get rid of the air and reduce the bloated feeling in your belly (abdomen).   No driving for 24 hours (because of the medicine (anesthesia) used during the test).    Do not sign any important legal documents or operate any machinery for 24 hours (because of the anesthesia used during the test).  NUTRITION  Drink plenty of fluids.   You may resume your normal diet as instructed by your doctor.   Begin with a light meal and progress to your normal diet. Heavy or fried foods are harder to digest and may make you feel sick to your stomach (nauseated).   Avoid alcoholic beverages for 24 hours or as instructed.  MEDICATIONS  You may resume your normal medications unless your doctor tells you otherwise.  WHAT YOU CAN EXPECT TODAY  Some feelings of bloating in the abdomen.   Passage of more gas than usual.   Spotting of blood in your stool or on the toilet paper.  IF YOU HAD POLYPS REMOVED DURING THE COLONOSCOPY:  No aspirin products for 7 days or as instructed.   No alcohol for 7 days or as instructed.   Eat a soft diet for the next 24 hours.  FINDING OUT THE RESULTS OF YOUR TEST Not all test results are available during your visit. If your test results are not back during the visit, make an appointment  with your caregiver to find out the results. Do not assume everything is normal if you have not heard from your caregiver or the medical facility. It is important for you to follow up on all of your test results.  SEEK IMMEDIATE MEDICAL ATTENTION IF:  You have more than a spotting of blood in your stool.   Your belly is swollen (abdominal distention).   You are nauseated or vomiting.   You have a temperature over 101.   You have abdominal pain or discomfort that is severe or gets worse throughout the day.    Hemorrhoid banding pamphlet provided  We'll schedule a small bowel capsule study to wrap up the GI evaluation prior to hemorrhoid banding.  Office visit with me in 2-3 weeks for hemorrhoid banding.

## 2017-03-31 NOTE — Transfer of Care (Signed)
Immediate Anesthesia Transfer of Care Note  Patient: Jeffrey [name]  Procedure(s) Performed: Procedure(s) with comments: COLONOSCOPY WITH PROPOFOL (N/A) - 1:45pm  Patient Location: PACU  Anesthesia Type:MAC  Level of Consciousness: awake, alert  and oriented  Airway & Oxygen Therapy: Patient Spontanous Breathing  Post-op Assessment: Report given to RN  Post vital signs: Reviewed and stable  Last Vitals:  Vitals:   03/31/17 1135  BP: (!) 112/58  Pulse: (!) 53  Resp: 13  Temp: 36.7 C    Last Pain:  Vitals:   03/31/17 1135  TempSrc: Oral      Patients Stated Pain Goal: 5 (48/27/07 8675)  Complications: No apparent anesthesia complications

## 2017-03-31 NOTE — Anesthesia Preprocedure Evaluation (Signed)
Anesthesia Evaluation  Patient identified by MRN, date of birth, ID band Patient awake    Reviewed: Allergy & Precautions, NPO status , Patient's Chart, lab work & pertinent test results  Airway Mallampati: I  TM Distance: >3 FB     Dental  (+) Edentulous Upper, Edentulous Lower   Pulmonary shortness of breath and with exertion, COPD,  COPD inhaler, Current Smoker,    breath sounds clear to auscultation       Cardiovascular hypertension, + Peripheral Vascular Disease   Rhythm:Regular Rate:Normal     Neuro/Psych PSYCHIATRIC DISORDERS Anxiety Depression Bipolar Disorder    GI/Hepatic PUD, GI bleed    Endo/Other    Renal/GU      Musculoskeletal   Abdominal   Peds  Hematology  (+) anemia ,   Anesthesia Other Findings   Reproductive/Obstetrics                             Anesthesia Physical Anesthesia Plan  ASA: III  Anesthesia Plan: MAC   Post-op Pain Management:    Induction: Intravenous  Airway Management Planned: Simple Face Mask  Additional Equipment:   Intra-op Plan:   Post-operative Plan:   Informed Consent: I have reviewed the patients History and Physical, chart, labs and discussed the procedure including the risks, benefits and alternatives for the proposed anesthesia with the patient or authorized representative who has indicated his/her understanding and acceptance.     Plan Discussed with:   Anesthesia Plan Comments:         Anesthesia Quick Evaluation

## 2017-04-01 ENCOUNTER — Telehealth: Payer: Self-pay

## 2017-04-01 ENCOUNTER — Other Ambulatory Visit: Payer: Self-pay

## 2017-04-01 ENCOUNTER — Encounter (HOSPITAL_COMMUNITY): Admission: RE | Admit: 2017-04-01 | Payer: Medicare HMO | Source: Ambulatory Visit

## 2017-04-01 ENCOUNTER — Encounter: Payer: Self-pay | Admitting: Internal Medicine

## 2017-04-01 DIAGNOSIS — K921 Melena: Secondary | ICD-10-CM

## 2017-04-01 NOTE — Telephone Encounter (Signed)
Jeffrey PikesSusan scheduled the patient & mail letter for the patient to come in on 5/29 at 1130

## 2017-04-01 NOTE — Telephone Encounter (Signed)
OV made and letter mailed °

## 2017-04-01 NOTE — Telephone Encounter (Signed)
Ginger, you can block the two urgent slots on [name] for 5/16 & 5/17 for the Given's Study to be read

## 2017-04-01 NOTE — Telephone Encounter (Addendum)
-----   Message ----- From: Kennis CarinaFarris, Altie Savard L, CMA Sent: 03/31/2017   4:32 PM To: Romilda Garretamille M McCollum, [name] S Lewis, PA-C  Pt is need a GIVENS capsule done but I don't anyone to read it until 04/26/17. Please advise

## 2017-04-01 NOTE — Telephone Encounter (Signed)
[facility], but my suggestion would be to block 2 urgent appointments to make the space for reading the Given's, ie May 16/17 on my schedule.

## 2017-04-01 NOTE — Telephone Encounter (Signed)
Pt is set up for GIVENS on 04/05/17 he is aware

## 2017-04-01 NOTE — Telephone Encounter (Signed)
Routing to CM 

## 2017-04-01 NOTE — Telephone Encounter (Signed)
Tried to call with no answer  

## 2017-04-02 NOTE — Anesthesia Postprocedure Evaluation (Signed)
Anesthesia Post Note  Patient: Jeffrey [name]  Procedure(s) Performed: Procedure(s) (LRB): COLONOSCOPY WITH PROPOFOL (N/A)  Patient location during evaluation: PACU Anesthesia Type: MAC Level of consciousness: awake and alert and oriented Pain management: pain level controlled Vital Signs Assessment: post-procedure vital signs reviewed and stable Respiratory status: spontaneous breathing Cardiovascular status: blood pressure returned to baseline Postop Assessment: no signs of nausea or vomiting Anesthetic complications: no Comments: Late entry, patient seen in PACU prior to discharge 03/31/17.     Last Vitals:  Vitals:   03/31/17 1415 03/31/17 1437  BP:  (!) 141/79  Pulse: 91 89  Resp: 18 16  Temp:  36.7 C    Last Pain:  Vitals:   04/01/17 1339  TempSrc:   PainSc: 0-No pain                 Dempsy Damiano

## 2017-04-05 ENCOUNTER — Encounter (HOSPITAL_COMMUNITY): Payer: Self-pay | Admitting: Internal Medicine

## 2017-04-05 ENCOUNTER — Other Ambulatory Visit: Payer: Self-pay

## 2017-04-05 ENCOUNTER — Telehealth: Payer: Self-pay

## 2017-04-05 ENCOUNTER — Ambulatory Visit (HOSPITAL_COMMUNITY)
Admission: RE | Admit: 2017-04-05 | Discharge: 2017-04-05 | Disposition: A | Discharge status: Home / Self Care | Payer: Medicare Other | Source: Ambulatory Visit | Attending: Internal Medicine | Admitting: Internal Medicine

## 2017-04-05 ENCOUNTER — Telehealth: Payer: Self-pay | Admitting: Internal Medicine

## 2017-04-05 ENCOUNTER — Encounter (HOSPITAL_COMMUNITY)
Admission: RE | Disposition: A | Discharge status: Home / Self Care | Payer: Self-pay | Source: Ambulatory Visit | Attending: Internal Medicine

## 2017-04-05 DIAGNOSIS — K625 Hemorrhage of anus and rectum: Secondary | ICD-10-CM

## 2017-04-05 DIAGNOSIS — K922 Gastrointestinal hemorrhage, unspecified: Secondary | ICD-10-CM

## 2017-04-05 DIAGNOSIS — K921 Melena: Secondary | ICD-10-CM

## 2017-04-05 SURGERY — IMAGING PROCEDURE, GI TRACT, INTRALUMINAL, VIA CAPSULE

## 2017-04-05 MED ORDER — SODIUM CHLORIDE 0.9 % IV SOLN: INTRAVENOUS | Status: DC

## 2017-04-05 MED ORDER — NA SULFATE-K SULFATE-MG SULF 17.5-3.13-1.6 GM/177ML PO SOLN: 1.0000 | ORAL | 0 refills | Status: DC

## 2017-04-05 NOTE — Telephone Encounter (Signed)
Pt called office and LMOVM that he missed appt this morning, he woke up at 7:00am (pt was scheduled for Givens). Melanie at Endo later called office and said they talked to pt and he is to be coming today, but since he is late he will be returning device tomorrow. She said it would be ok for Givens to be read 04/07/17.

## 2017-04-05 NOTE — Telephone Encounter (Signed)
PA info for Givens Capsule [facility]. No [facility] needed. Decision ID# [id number].

## 2017-04-05 NOTE — OR Nursing (Signed)
Patient arrived for pill cam study for had just drank a glass of mountain dew so procedure was cancelled.   Office notified.

## 2017-04-05 NOTE — Telephone Encounter (Signed)
Tammy from Endo called to say that patient was late this morning, he had a Mt Dew before coming and is on ASA and iron pills.

## 2017-04-05 NOTE — Telephone Encounter (Signed)
Tried to call pt x3 to reschedule Givens. No answer, unable to leave voicemail.

## 2017-04-05 NOTE — Telephone Encounter (Signed)
Pt called office. Givens Capsule Study scheduled for 04/13/17. [facility]'s schedule blocked for 04/14/17 and 04/15/17 to read Givens. Pt stated he has not been on Iron in 2 weeks. Advised pt to not take any Iron before Givens. Informed him to not take Iron 04/11/17 and 04/12/17 prior to Givens. He will come to office today to pick-up instructions for Givens.  Pt said he knows he messed up this morning, but he is bleeding "bad". He is having bright red blood every time he has a BM. Said he is weak. Advised him I would send message to LSL. He is scheduled for hemorrhoid banding with RMR 04/20/17.

## 2017-04-05 NOTE — Telephone Encounter (Signed)
Let's update a CBC today. Tell patient if he feels lightheaded, dizzy, has increased rectal bleeding he can go to ER for evaluation.

## 2017-04-05 NOTE — Telephone Encounter (Signed)
Called and informed pt. He will pick-up order for CBC today.

## 2017-04-05 NOTE — Telephone Encounter (Signed)
Pt came to office and picked up his Givens instructions. He said he wasn't going to do the blood work. I asked why he wasn't going to have the blood work, he said he talked to his neighbor and he was beligerent the last time this happened and didn't know what was going on. I told him he really needs to do the blood work to see if his blood counts are low and not to wait until he's like he was before. He then said he would do the blood work. Order for CBC given to pt. Givens instructions were also explained to him.

## 2017-04-06 LAB — CBC WITH DIFFERENTIAL/PLATELET
Basophils Absolute: 56 cells/uL (ref 0–200)
Basophils Relative: 1 %
Eosinophils Absolute: 56 cells/uL (ref 15–500)
Eosinophils Relative: 1 %
HCT: 29.8 % — ABNORMAL LOW (ref 38.5–50.0)
Hemoglobin: 9 g/dL — ABNORMAL LOW (ref 13.2–17.1)
Lymphocytes Relative: 24 %
Lymphs Abs: 1344 cells/uL (ref 850–3900)
MCH: 21.3 pg — ABNORMAL LOW (ref 27.0–33.0)
MCHC: 30.2 g/dL — ABNORMAL LOW (ref 32.0–36.0)
MCV: 70.6 fL — ABNORMAL LOW (ref 80.0–100.0)
MPV: 10.1 fL (ref 7.5–12.5)
Monocytes Absolute: 560 cells/uL (ref 200–950)
Monocytes Relative: 10 %
Neutro Abs: 3584 cells/uL (ref 1500–7800)
Neutrophils Relative %: 64 %
Platelets: 392 10*3/uL (ref 140–400)
RBC: 4.22 MIL/uL (ref 4.20–5.80)
RDW: 22.4 % — ABNORMAL HIGH (ref 11.0–15.0)
WBC: 5.6 10*3/uL (ref 3.8–10.8)

## 2017-04-06 NOTE — Progress Notes (Signed)
His Hgb is 9. 03/29/17 11.8 but 4/10 9.9. Unclear if 11.8 was accurate or not. Patient needs to have his capsule study as planned.  IF he is seeing a lot of rectal bleeding, black stools, feels lightheaded, he needs to go to ER.  Otherwise I would like for his CBC to be repeated day of his capsule study.

## 2017-04-07 ENCOUNTER — Other Ambulatory Visit: Payer: Self-pay

## 2017-04-07 ENCOUNTER — Inpatient Hospital Stay (HOSPITAL_COMMUNITY)
Admission: EM | Admit: 2017-04-07 | Discharge: 2017-04-15 | DRG: 348 | Disposition: A | Discharge status: Home / Self Care | Payer: Medicare Other | Attending: Internal Medicine | Admitting: Internal Medicine

## 2017-04-07 ENCOUNTER — Encounter (HOSPITAL_COMMUNITY): Payer: Self-pay | Admitting: Emergency Medicine

## 2017-04-07 ENCOUNTER — Telehealth: Payer: Self-pay

## 2017-04-07 ENCOUNTER — Observation Stay (HOSPITAL_COMMUNITY): Payer: Medicare Other

## 2017-04-07 DIAGNOSIS — K625 Hemorrhage of anus and rectum: Secondary | ICD-10-CM

## 2017-04-07 DIAGNOSIS — H547 Unspecified visual loss: Secondary | ICD-10-CM

## 2017-04-07 DIAGNOSIS — F319 Bipolar disorder, unspecified: Secondary | ICD-10-CM | POA: Diagnosis not present

## 2017-04-07 DIAGNOSIS — K648 Other hemorrhoids: Secondary | ICD-10-CM | POA: Diagnosis not present

## 2017-04-07 DIAGNOSIS — J449 Chronic obstructive pulmonary disease, unspecified: Secondary | ICD-10-CM | POA: Diagnosis present

## 2017-04-07 DIAGNOSIS — F17200 Nicotine dependence, unspecified, uncomplicated: Secondary | ICD-10-CM | POA: Diagnosis not present

## 2017-04-07 DIAGNOSIS — D500 Iron deficiency anemia secondary to blood loss (chronic): Secondary | ICD-10-CM

## 2017-04-07 DIAGNOSIS — D62 Acute posthemorrhagic anemia: Secondary | ICD-10-CM | POA: Diagnosis not present

## 2017-04-07 DIAGNOSIS — Z79899 Other long term (current) drug therapy: Secondary | ICD-10-CM

## 2017-04-07 DIAGNOSIS — K644 Residual hemorrhoidal skin tags: Secondary | ICD-10-CM

## 2017-04-07 DIAGNOSIS — Z7951 Long term (current) use of inhaled steroids: Secondary | ICD-10-CM

## 2017-04-07 DIAGNOSIS — Z8249 Family history of ischemic heart disease and other diseases of the circulatory system: Secondary | ICD-10-CM

## 2017-04-07 DIAGNOSIS — K2980 Duodenitis without bleeding: Secondary | ICD-10-CM | POA: Diagnosis present

## 2017-04-07 DIAGNOSIS — E538 Deficiency of other specified B group vitamins: Secondary | ICD-10-CM | POA: Diagnosis present

## 2017-04-07 DIAGNOSIS — K921 Melena: Secondary | ICD-10-CM | POA: Diagnosis not present

## 2017-04-07 DIAGNOSIS — I739 Peripheral vascular disease, unspecified: Secondary | ICD-10-CM | POA: Diagnosis present

## 2017-04-07 DIAGNOSIS — K2961 Other gastritis with bleeding: Secondary | ICD-10-CM

## 2017-04-07 DIAGNOSIS — K319 Disease of stomach and duodenum, unspecified: Secondary | ICD-10-CM | POA: Diagnosis present

## 2017-04-07 DIAGNOSIS — Z803 Family history of malignant neoplasm of breast: Secondary | ICD-10-CM

## 2017-04-07 DIAGNOSIS — Z825 Family history of asthma and other chronic lower respiratory diseases: Secondary | ICD-10-CM

## 2017-04-07 DIAGNOSIS — Z7982 Long term (current) use of aspirin: Secondary | ICD-10-CM

## 2017-04-07 DIAGNOSIS — K269 Duodenal ulcer, unspecified as acute or chronic, without hemorrhage or perforation: Secondary | ICD-10-CM | POA: Diagnosis present

## 2017-04-07 DIAGNOSIS — D691 Qualitative platelet defects: Secondary | ICD-10-CM | POA: Diagnosis present

## 2017-04-07 DIAGNOSIS — K922 Gastrointestinal hemorrhage, unspecified: Secondary | ICD-10-CM | POA: Diagnosis present

## 2017-04-07 DIAGNOSIS — F17210 Nicotine dependence, cigarettes, uncomplicated: Secondary | ICD-10-CM | POA: Diagnosis present

## 2017-04-07 LAB — COMPREHENSIVE METABOLIC PANEL
ALT: 11 U/L — ABNORMAL LOW (ref 17–63)
AST: 22 U/L (ref 15–41)
Albumin: 3.6 g/dL (ref 3.5–5.0)
Alkaline Phosphatase: 58 U/L (ref 38–126)
Anion gap: 7 (ref 5–15)
BUN: 11 mg/dL (ref 6–20)
CO2: 26 mmol/L (ref 22–32)
Calcium: 9.1 mg/dL (ref 8.9–10.3)
Chloride: 102 mmol/L (ref 101–111)
Creatinine, Ser: 1.01 mg/dL (ref 0.61–1.24)
GFR calc Af Amer: >60 mL/min (ref >60)
GFR calc non Af Amer: >60 mL/min (ref >60)
Glucose, Bld: 98 mg/dL (ref 65–99)
Potassium: 4 mmol/L (ref 3.5–5.1)
Sodium: 135 mmol/L (ref 135–145)
Total Bilirubin: 0.2 mg/dL — ABNORMAL LOW (ref 0.3–1.2)
Total Protein: 6.4 g/dL — ABNORMAL LOW (ref 6.5–8.1)

## 2017-04-07 LAB — CBC WITH DIFFERENTIAL/PLATELET
Basophils Absolute: 0.1 10*3/uL (ref 0.0–0.1)
Basophils Relative: 1 %
Eosinophils Absolute: 0.1 10*3/uL (ref 0.0–0.7)
Eosinophils Relative: 2 %
HCT: 28.1 % — ABNORMAL LOW (ref 39.0–52.0)
Hemoglobin: 8.9 g/dL — ABNORMAL LOW (ref 13.0–17.0)
Lymphocytes Relative: 23 %
Lymphs Abs: 1.5 10*3/uL (ref 0.7–4.0)
MCH: 21.9 pg — ABNORMAL LOW (ref 26.0–34.0)
MCHC: 31.7 g/dL (ref 30.0–36.0)
MCV: 69.2 fL — ABNORMAL LOW (ref 78.0–100.0)
Monocytes Absolute: 0.4 10*3/uL (ref 0.1–1.0)
Monocytes Relative: 6 %
Neutro Abs: 4.6 10*3/uL (ref 1.7–7.7)
Neutrophils Relative %: 68 %
Platelets: 381 10*3/uL (ref 150–400)
RBC: 4.06 MIL/uL — ABNORMAL LOW (ref 4.22–5.81)
RDW: 22 % — ABNORMAL HIGH (ref 11.5–15.5)
WBC: 6.7 10*3/uL (ref 4.0–10.5)

## 2017-04-07 MED ORDER — PANTOPRAZOLE SODIUM 40 MG PO TBEC
40.0000 mg | DELAYED_RELEASE_TABLET | Freq: Every day | ORAL | Status: DC
  Administered 2017-04-09 – 2017-04-15 (×7): 40 mg via ORAL
  Filled 2017-04-07 (×7): qty 1

## 2017-04-07 MED ORDER — FENOFIBRATE 160 MG PO TABS
160.0000 mg | ORAL_TABLET | Freq: Every day | ORAL | Status: DC
Start: 2017-04-08 — End: 2017-04-15
  Administered 2017-04-09 – 2017-04-15 (×7): 160 mg via ORAL
  Filled 2017-04-07 (×7): qty 1

## 2017-04-07 MED ORDER — SODIUM CHLORIDE 0.9 % IV SOLN
INTRAVENOUS | Status: DC
  Administered 2017-04-07 – 2017-04-14 (×8): via INTRAVENOUS

## 2017-04-07 MED ORDER — CLONAZEPAM 0.5 MG PO TABS
0.5000 mg | ORAL_TABLET | Freq: Two times a day (BID) | ORAL | Status: DC
  Administered 2017-04-07 – 2017-04-15 (×15): 0.5 mg via ORAL
  Filled 2017-04-07 (×15): qty 1

## 2017-04-07 MED ORDER — ACETAMINOPHEN 650 MG RE SUPP: 650.0000 mg | Freq: Four times a day (QID) | RECTAL | Status: DC | PRN

## 2017-04-07 MED ORDER — TECHNETIUM TC 99M-LABELED RED BLOOD CELLS IV KIT
20.0000 | PACK | Freq: Once | INTRAVENOUS | Status: AC | PRN
  Administered 2017-04-07: 22 via INTRAVENOUS

## 2017-04-07 MED ORDER — OLANZAPINE 5 MG PO TABS
5.0000 mg | ORAL_TABLET | Freq: Every day | ORAL | Status: DC
  Administered 2017-04-07 – 2017-04-14 (×8): 5 mg via ORAL
  Filled 2017-04-07 (×8): qty 1

## 2017-04-07 MED ORDER — MOMETASONE FURO-FORMOTEROL FUM 200-5 MCG/ACT IN AERO
2.0000 | INHALATION_SPRAY | Freq: Two times a day (BID) | RESPIRATORY_TRACT | Status: DC
  Administered 2017-04-08 – 2017-04-15 (×15): 2 via RESPIRATORY_TRACT
  Filled 2017-04-07: qty 8.8

## 2017-04-07 MED ORDER — FLUOXETINE HCL 20 MG PO CAPS
40.0000 mg | ORAL_CAPSULE | ORAL | Status: DC
  Administered 2017-04-08 – 2017-04-15 (×8): 40 mg via ORAL
  Filled 2017-04-07 (×8): qty 2

## 2017-04-07 MED ORDER — ATORVASTATIN CALCIUM 20 MG PO TABS
20.0000 mg | ORAL_TABLET | Freq: Every day | ORAL | Status: DC
  Administered 2017-04-09 – 2017-04-15 (×7): 20 mg via ORAL
  Filled 2017-04-07 (×7): qty 1

## 2017-04-07 MED ORDER — TEMAZEPAM 15 MG PO CAPS
30.0000 mg | ORAL_CAPSULE | Freq: Every evening | ORAL | Status: DC | PRN
  Administered 2017-04-08 – 2017-04-13 (×6): 30 mg via ORAL
  Filled 2017-04-07 (×7): qty 2

## 2017-04-07 MED ORDER — DIVALPROEX SODIUM 250 MG PO DR TAB
750.0000 mg | DELAYED_RELEASE_TABLET | Freq: Every day | ORAL | Status: DC
  Administered 2017-04-07 – 2017-04-14 (×8): 750 mg via ORAL
  Filled 2017-04-07 (×8): qty 3

## 2017-04-07 MED ORDER — ONDANSETRON HCL 4 MG PO TABS: 4.0000 mg | ORAL_TABLET | Freq: Four times a day (QID) | ORAL | Status: DC | PRN

## 2017-04-07 MED ORDER — TIOTROPIUM BROMIDE MONOHYDRATE 18 MCG IN CAPS
18.0000 ug | ORAL_CAPSULE | Freq: Every day | RESPIRATORY_TRACT | Status: DC
  Administered 2017-04-08 – 2017-04-15 (×8): 18 ug via RESPIRATORY_TRACT
  Filled 2017-04-07 (×2): qty 5

## 2017-04-07 MED ORDER — HEPARIN SOD (PORK) LOCK FLUSH 100 UNIT/ML IV SOLN
INTRAVENOUS | Status: AC
  Filled 2017-04-07: qty 5

## 2017-04-07 MED ORDER — ONDANSETRON HCL 4 MG/2ML IJ SOLN: 4.0000 mg | Freq: Four times a day (QID) | INTRAMUSCULAR | Status: DC | PRN

## 2017-04-07 MED ORDER — ACETAMINOPHEN 325 MG PO TABS
650.0000 mg | ORAL_TABLET | Freq: Four times a day (QID) | ORAL | Status: DC | PRN
  Administered 2017-04-08 – 2017-04-14 (×3): 650 mg via ORAL
  Filled 2017-04-07 (×3): qty 2

## 2017-04-07 NOTE — Telephone Encounter (Signed)
Noted. We will await pending labs and work up.

## 2017-04-07 NOTE — ED Triage Notes (Signed)
Pt c/o rectal bleeding x 2 weeks. Bright red clots. Seen dr Jena Gaussrourk yesterday. Was called today and advised to come here due to low hgb

## 2017-04-07 NOTE — ED Notes (Signed)
Consult to Gastroenterology completed at this time.

## 2017-04-07 NOTE — Telephone Encounter (Signed)
Pt called office and said he just went to the bathroom and the toilet was full of blood. He is going to ER. Told him I would send message to LSL to let her know.
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
## 2017-04-07 NOTE — Progress Notes (Signed)
See phone note. Patient with increased rectal bleeding and went to ER for evaluation.
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
## 2017-04-08 ENCOUNTER — Encounter (HOSPITAL_COMMUNITY): Payer: Self-pay | Admitting: Gastroenterology

## 2017-04-08 ENCOUNTER — Encounter (HOSPITAL_COMMUNITY)
Admission: EM | Disposition: A | Discharge status: Home / Self Care | Payer: Self-pay | Source: Home / Self Care | Attending: Family Medicine

## 2017-04-08 DIAGNOSIS — K2961 Other gastritis with bleeding: Secondary | ICD-10-CM | POA: Diagnosis not present

## 2017-04-08 DIAGNOSIS — D500 Iron deficiency anemia secondary to blood loss (chronic): Secondary | ICD-10-CM | POA: Diagnosis not present

## 2017-04-08 DIAGNOSIS — J449 Chronic obstructive pulmonary disease, unspecified: Secondary | ICD-10-CM | POA: Diagnosis not present

## 2017-04-08 DIAGNOSIS — F17200 Nicotine dependence, unspecified, uncomplicated: Secondary | ICD-10-CM | POA: Diagnosis not present

## 2017-04-08 DIAGNOSIS — K264 Chronic or unspecified duodenal ulcer with hemorrhage: Secondary | ICD-10-CM | POA: Diagnosis not present

## 2017-04-08 DIAGNOSIS — K625 Hemorrhage of anus and rectum: Secondary | ICD-10-CM

## 2017-04-08 DIAGNOSIS — H547 Unspecified visual loss: Secondary | ICD-10-CM | POA: Diagnosis not present

## 2017-04-08 DIAGNOSIS — F319 Bipolar disorder, unspecified: Secondary | ICD-10-CM | POA: Diagnosis not present

## 2017-04-08 DIAGNOSIS — K922 Gastrointestinal hemorrhage, unspecified: Secondary | ICD-10-CM | POA: Diagnosis not present

## 2017-04-08 DIAGNOSIS — D62 Acute posthemorrhagic anemia: Secondary | ICD-10-CM | POA: Diagnosis not present

## 2017-04-08 DIAGNOSIS — K921 Melena: Secondary | ICD-10-CM | POA: Diagnosis not present

## 2017-04-08 HISTORY — PX: GIVENS CAPSULE STUDY: SHX5432

## 2017-04-08 LAB — CBC
HCT: 24.2 % — ABNORMAL LOW (ref 39.0–52.0)
Hemoglobin: 7.3 g/dL — ABNORMAL LOW (ref 13.0–17.0)
MCH: 21.2 pg — ABNORMAL LOW (ref 26.0–34.0)
MCHC: 30.2 g/dL (ref 30.0–36.0)
MCV: 70.3 fL — ABNORMAL LOW (ref 78.0–100.0)
Platelets: 331 10*3/uL (ref 150–400)
RBC: 3.44 MIL/uL — ABNORMAL LOW (ref 4.22–5.81)
RDW: 21.9 % — ABNORMAL HIGH (ref 11.5–15.5)
WBC: 4.2 10*3/uL (ref 4.0–10.5)

## 2017-04-08 LAB — HEMOGLOBIN AND HEMATOCRIT, BLOOD
HCT: 23.7 % — ABNORMAL LOW (ref 39.0–52.0)
HCT: 23.7 % — ABNORMAL LOW (ref 39.0–52.0)
HCT: 22.3 % — ABNORMAL LOW (ref 39.0–52.0)
Hemoglobin: 7.5 g/dL — ABNORMAL LOW (ref 13.0–17.0)
Hemoglobin: 7.2 g/dL — ABNORMAL LOW (ref 13.0–17.0)
Hemoglobin: 7 g/dL — ABNORMAL LOW (ref 13.0–17.0)

## 2017-04-08 SURGERY — IMAGING PROCEDURE, GI TRACT, INTRALUMINAL, VIA CAPSULE

## 2017-04-08 MED ORDER — SODIUM CHLORIDE 0.9 % IV SOLN: INTRAVENOUS | Status: DC

## 2017-04-08 NOTE — Care Management Obs Status (Signed)
MEDICARE OBSERVATION STATUS NOTIFICATION   Patient Details  Name: Marni Griffondward T Stevick MRN: [id number] Date of Birth: 05-08-1961   Medicare Observation Status Notification Given:  Yes    Malcolm MetroChildress, Dominyck Reser Demske, RN 04/08/2017, 12:37 PM

## 2017-04-08 NOTE — Progress Notes (Signed)
Initial Nutrition Assessment  DOCUMENTATION CODES:   Not applicable  INTERVENTION:  When pt is cleared for diet advancement:Ensure Enlive po BID, each supplement provides 350 kcal and 20 grams of protein   NUTRITION DIAGNOSIS:   Inadequate oral intake related to poor appetite (for the past 2-3 days ) as evidenced by per patient/family report.   GOAL:   Patient will meet greater than or equal to 90% of their needs   MONITOR:   Diet advancement, PO intake, Labs, Weight trends (Findings of GI workup)  REASON FOR ASSESSMENT:   Malnutrition Screening Tool    ASSESSMENT:  Mr Janice NorrieFrench is a 56 yo male with hx of [name], Biplar disorder, PUD, severe COPD and is an every day smoker.  He presents with rectal bleeding and GI is following. The pt received a Givens Capsule this morning. Upon RD visit pt is c/o feeling hungry. He says his appetite has been poor the past 2-3 prior to coming in but now is asking for solid foods. Home diet is regular. Per GI note pt may have full liquids after 6 pm and nursing is following up.  Weight hx: pt reports usual body wt of 68-69 kg. Denies acute changes and re-wt was obtained. Current wt 68.9 kg.  Nutrition-Focused physical exam completed. Findings are no fat depletion, mild temporal muscle depletion, and no edema.      Recent Labs Lab 04/07/17 1434  NA 135  K 4.0  CL 102  CO2 26  BUN 11  CREATININE 1.01  CALCIUM 9.1  GLUCOSE 98   Labs: reviewed  Meds: fenfibrate, protonix, depakote  Diet Order:  Diet NPO time specified Except for: Sips with Meds Diet full liquid Room service appropriate? Yes; Fluid consistency: Thin  Skin:  Reviewed, no issues  Last BM:  prior to admission  Height:   Ht Readings from Last 1 Encounters:  04/07/17 5\' 10"  (1.778 m)    Weight:   Wt Readings from Last 1 Encounters:  04/07/17 137 lb 4.8 oz (62.3 kg)  Re-wt obtained. Pt denies acute wt changes.   Ideal Body Weight:  75 kg  BMI:  Body mass index is  19.7 kg/m.  Estimated Nutritional Needs:   Kcal:  [id number] (28-32 kcal/kg)  Protein:  83-90 gr (1.2-1.3 gr /kg)  Fluid:  1.9-2.2 Liters daily  EDUCATION NEEDS:   Education needs no appropriate at this time  Royann ShiversLynn Kinza Gouveia MS,RD,CSG,LDN Office: [phone] Pager: [phone]

## 2017-04-08 NOTE — Telephone Encounter (Signed)
Noted  

## 2017-04-08 NOTE — Progress Notes (Signed)
Appears to have received Givens Capsule approximately 10 am. Will order full liquid diet 8 hours after (6 pm) until initial quick read in the morning pending any further endoscopic evaluation.

## 2017-04-08 NOTE — Telephone Encounter (Signed)
Patient is currently admitted. He will have his capsule study today.

## 2017-04-08 NOTE — Progress Notes (Signed)
PROGRESS NOTE    Jeffrey [name]  ZOX:[id number] DOB: Oct 15, 1961 DOA: 04/07/2017 PCP: Barbie Banner, MD    Brief Narrative:  Jeffrey [name] is a 56 y.o. male with a past medical history significant for COPD, Bipolar and chronic rectal bleeding who presents with rectal bleeding and abnormal labs.  The patient has chronic rectal bleeding, in the past this has been serious enough to require transfusion. Over the last few weeks, he has had bleeding from his rectum again. He describes this as "sitting on the toilet and blood drips out".  He had labs drawn by GI two days ago that showed a 2-pt drop in Hgb.  His bleeding persisted and today he talked to them and was recommended to go to the ER.  He has had no hematemesis, melena.  He takes baby aspirin, no other platelet agents or anticoagulants.  He had a colonoscopy 1 week ago that showed hemorrhoids, was being prepped for a capsule study.  ED course: -Afebrile, heart rate 71, respirations and pulse ox normal, blood pressure 121/74 -Na 135, K 4.0, Cr 1.01, WBC 6.7K, Hgb 8.9 stable from 2 days ago -The case was discussed with gastroenterology, who recommended a tagged red cell scan -TRH were asked to admit   Assessment & Plan:   Principal Problem:   Acute blood loss anemia Active Problems:   Hematochezia   Tobacco use disorder   COPD, severe (HCC)   Bipolar disorder (HCC)   Acute blood loss anemia from rectal bleeding:  - just had colonoscopy last week showing internal hemorrhoids and no diverticular bleeding or colonic AVM. History of AAA surgery   - NM tagged red cell scan last night not showing any overt source of bleeding - decrease in H/H overnight, will follow q4h - Hold aspirin - Transfusion threshold <7 g/dL - Gastroenterology consulted, appreciate their recommendations   Bipolar disorder:  - Continue Depakote, Zyprexa - Continue Prozac - Continue Klonopin  COPD:  - Continue Symbicort - Continue Spiriva -  Albuterol PRN - Smoking cessation recommended   DVT prophylaxis: SCDs  Code Status: FULL  Family Communication:  Disposition Plan:     Consultants:   GI  Procedures:   Tagged red blood scan  Antimicrobials:   none    Subjective: Patient seen and examined this am.  Denies any increase in bleeding overnight but states he has not had any bowel movements and normally only notices a significant bleed with a bowel movement.  Denies feeling lightheaded or dizzy.    Objective: Vitals:   04/07/17 1745 04/07/17 1800 04/07/17 2316 04/08/17 0551  BP:  113/65 126/63 109/60  Pulse: (!) 59 60 63 75  Resp: (!) 24 (!) 22 (!) 22 20  Temp:   99.2 F (37.3 C) 98.7 F (37.1 C)  TempSrc:   Oral Oral  SpO2: 97% 99% 98% 96%  Weight:   62.3 kg (137 lb 4.8 oz)   Height:   5\' 10"  (1.778 m)     Intake/Output Summary (Last 24 hours) at 04/08/17 0810 Last data filed at 04/08/17 0600  Gross per 24 hour  Intake           661.67 ml  Output                0 ml  Net           661.67 ml   Filed Weights   04/07/17 1426 04/07/17 2316  Weight: 68.5 kg (151 lb) 62.3 kg (137  lb 4.8 oz)    Examination:  General exam: Appears calm and comfortable  Respiratory system: Clear to auscultation. Respiratory effort normal. Cardiovascular system: S1 & S2 heard, RRR. No JVD, murmurs, rubs, gallops or clicks. No pedal edema. Gastrointestinal system: Abdomen is nondistended, soft and nontender. No organomegaly or masses felt. Normal bowel sounds heard. Central nervous system: Alert and oriented. No focal neurological deficits. Extremities: Symmetric 5 x 5 power. Skin: No rashes, lesions or ulcers Psychiatry: Judgement and insight appear normal. Mood & affect appropriate.     Data Reviewed: I have personally reviewed following labs and imaging studies  CBC:  Recent Labs Lab 04/05/17 1656 04/07/17 1434 04/08/17 0553  WBC 5.6 6.7 4.2  NEUTROABS 3,584 4.6  --   HGB 9.0* 8.9* 7.3*  HCT 29.8*  28.1* 24.2*  MCV 70.6* 69.2* 70.3*  PLT 392 381 331   Basic Metabolic Panel:  Recent Labs Lab 04/07/17 1434  NA 135  K 4.0  CL 102  CO2 26  GLUCOSE 98  BUN 11  CREATININE 1.01  CALCIUM 9.1   GFR: Estimated Creatinine Clearance: 72 mL/min (by C-G formula based on SCr of 1.01 mg/dL). Liver Function Tests:  Recent Labs Lab 04/07/17 1434  AST 22  ALT 11*  ALKPHOS 58  BILITOT 0.2*  PROT 6.4*  ALBUMIN 3.6   No results for input(s): LIPASE, AMYLASE in the last 168 hours. No results for input(s): AMMONIA in the last 168 hours. Coagulation Profile: No results for input(s): INR, PROTIME in the last 168 hours. Cardiac Enzymes: No results for input(s): CKTOTAL, CKMB, CKMBINDEX, TROPONINI in the last 168 hours. BNP (last 3 results) No results for input(s): PROBNP in the last 8760 hours. HbA1C: No results for input(s): HGBA1C in the last 72 hours. CBG: No results for input(s): GLUCAP in the last 168 hours. Lipid Profile: No results for input(s): CHOL, HDL, LDLCALC, TRIG, CHOLHDL, LDLDIRECT in the last 72 hours. Thyroid Function Tests: No results for input(s): TSH, T4TOTAL, FREET4, T3FREE, THYROIDAB in the last 72 hours. Anemia Panel: No results for input(s): VITAMINB12, FOLATE, FERRITIN, TIBC, IRON, RETICCTPCT in the last 72 hours. Sepsis Labs: No results for input(s): PROCALCITON, LATICACIDVEN in the last 168 hours.  No results found for this or any previous visit (from the past 240 hour(s)).       Radiology Studies: Nm Gi Blood Loss  Result Date: 04/07/2017 CLINICAL DATA:  GI bleed. Gastrointestinal hemorrhage associated with other gastritis. EXAM: NUCLEAR MEDICINE GASTROINTESTINAL BLEEDING SCAN TECHNIQUE: Sequential abdominal images were obtained following intravenous administration of Tc-29m labeled red blood cells. Imaging obtained for 2 hours. RADIOPHARMACEUTICALS:  22 mCi Tc-3m in-vitro labeled red cells. COMPARISON:  None. FINDINGS: There no scintigraphic  findings to localize source of GI bleed. Central pelvic activity does not course along the course of the GI tract is likely secondary to bladder activity. Peroneal/ penile activity also noted. IMPRESSION: No scintigraphic localization of GI bleed. Electronically Signed   By: Rubye Oaks M.D.   On: 04/07/2017 23:11        Scheduled Meds: . atorvastatin  20 mg Oral Daily  . clonazePAM  0.5 mg Oral BID  . divalproex  750 mg Oral QHS  . fenofibrate  160 mg Oral Daily  . FLUoxetine  40 mg Oral BH-q7a  . heparin lock flush      . mometasone-formoterol  2 puff Inhalation BID  . OLANZapine  5 mg Oral QHS  . pantoprazole  40 mg Oral QAC breakfast  .  tiotropium  18 mcg Inhalation Daily   Continuous Infusions: . sodium chloride 100 mL/hr at 04/07/17 2323     LOS: 0 days    Time spent: 30 minutes    Katrinka BlazingAlex U Kadolph, MD [facility] Pager [phone]  If 7PM-7AM, please contact night-coverage [email or web address] Password [email or web address] 04/08/2017, 8:10 AM

## 2017-04-08 NOTE — Consult Note (Signed)
Referring Provider: Filbert SchilderKadolph, Alexandria U, MD Primary Care Physician:  Barbie BannerWilson, Fred H, MD Primary Gastroenterologist:  Roetta SessionsMichael Rourk, MD   Reason for Consultation:  GI bleeding  HPI: Jeffrey [name] is a 56 y.o. male with h/o chronic rectal bleeding with recent work up including colonoscopy 03/31/17 revealing Grade II hemorrhoids, EGD 02/09/17 when patient presented to hospital with SOB, dark stools, hgb 7.6, MCV 60s showed nonobstructing Schatzki ring, small nonbleeding erosions in the stomach, mild duodenitis with benign path. No H.pylori or celiac disease on path. He was scheduled for small bowel capsule study this week but it had to be rescheduled as patient overslept and ate breakfast.   He reported heavy rectal bleeding on Monday. Hgb was down to 9.0 from 11.8 one week prior. He called yesterday with further heavy rectal bleeding and Hgb down to 8.9. This morning Hgb 7.3. Has not been transfused this admission. NM GI bleeding scan was unremarkable yesterday. Last episode of bleeding late yesterday evening and no BM since. No rectal pain, abdominal pain, nausea. Has felt weak, lightheaded. No SOB from baseline. Some indigestion last night. No melena. BM pure blood, fills the toilet. No stool. Some clots on the tissue.   Patient reports daily 81mg  aspirin. Denies other nsaids. Patient also states he NEVER took the pantoprazole he was prescribed in 01/2017.   Prior to Admission medications   Medication Sig Start Date End Date Taking? Authorizing Provider  acetaminophen (TYLENOL) 500 MG tablet Take 500-1,000 mg by mouth every 6 (six) hours as needed (FOR HEADACHES.).   Yes [provider]  albuterol (PROAIR HFA) 108 (90 Base) MCG/ACT inhaler Inhale 2 puffs into the lungs every 4 (four) hours as needed for wheezing or shortness of breath. 02/02/17  Yes Roslynn AmbleNestor, Jennings E, MD  albuterol (PROVENTIL) (2.5 MG/3ML) 0.083% nebulizer solution Take 3 mLs (2.5 mg total) by nebulization every 4 (four)  hours as needed for shortness of breath. 02/02/17  Yes Roslynn AmbleNestor, Jennings E, MD  aspirin EC 81 MG tablet Take 81 mg by mouth every evening.    Yes [provider]  atorvastatin (LIPITOR) 20 MG tablet Take 20 mg by mouth daily.    Yes [provider]  budesonide-formoterol (SYMBICORT) 160-4.5 MCG/ACT inhaler Inhale 2 puffs into the lungs 2 (two) times daily. 03/02/17  Yes Devoria AlbeKnapp, Iva, MD  clonazePAM (KLONOPIN) 0.5 MG tablet Take 1 tablet (0.5 mg total) by mouth 2 (two) times daily. 03/05/17 03/05/18 Yes Myrlene Brokeross, Deborah R, MD  divalproex (DEPAKOTE) 250 MG DR tablet Take 3 tablets (750 mg total) by mouth at bedtime. 03/05/17  Yes Myrlene Brokeross, Deborah R, MD  fenofibrate micronized (LOFIBRA) 134 MG capsule Take 134 mg by mouth daily before breakfast.    Yes [provider]  FLUoxetine (PROZAC) 40 MG capsule Take 1 capsule (40 mg total) by mouth every morning. 03/05/17  Yes Myrlene Brokeross, Deborah R, MD  OLANZapine (ZYPREXA) 5 MG tablet Take 1 tablet (5 mg total) by mouth at bedtime. 03/05/17 03/05/18 Yes Myrlene Brokeross, Deborah R, MD  temazepam (RESTORIL) 30 MG capsule Take 1 capsule (30 mg total) by mouth at bedtime as needed for sleep. 03/05/17  Yes Myrlene Brokeross, Deborah R, MD  Tiotropium Bromide Monohydrate (SPIRIVA RESPIMAT) 2.5 MCG/ACT AERS Inhale 1 puff into the lungs daily.   Yes [provider]  ferrous sulfate 325 (65 FE) MG tablet Take 1 tablet (325 mg total) by mouth 3 (three) times daily with meals. Patient not taking: Reported on 04/07/2017 03/02/17   Devoria AlbeKnapp, Iva, MD  pantoprazole (PROTONIX) 40 MG tablet Take 1 tablet (40 mg total) by mouth daily before breakfast. Patient not taking: Reported on 03/09/2017 03/09/17   Tiffany Kocher, PA-C            Current Facility-Administered Medications  Medication Dose Route Frequency Provider Last Rate Last Dose  . 0.9 %  sodium chloride infusion   Intravenous Continuous Alberteen Sam, MD 100 mL/hr at 04/07/17 2323    . acetaminophen (TYLENOL) tablet 650 mg   650 mg Oral Q6H PRN Danford, Earl Lites, MD       Or  . acetaminophen (TYLENOL) suppository 650 mg  650 mg Rectal Q6H PRN Danford, Earl Lites, MD      . atorvastatin (LIPITOR) tablet 20 mg  20 mg Oral Daily Danford, Earl Lites, MD      . clonazePAM Scarlette Calico) tablet 0.5 mg  0.5 mg Oral BID Alberteen Sam, MD   0.5 mg at 04/07/17 2323  . divalproex (DEPAKOTE) DR tablet 750 mg  750 mg Oral QHS Alberteen Sam, MD   750 mg at 04/07/17 2323  . fenofibrate tablet 160 mg  160 mg Oral Daily Danford, Earl Lites, MD      . FLUoxetine (PROZAC) capsule 40 mg  40 mg Oral BH-q7a Danford, Earl Lites, MD   40 mg at 04/08/17 0602  . mometasone-formoterol (DULERA) 200-5 MCG/ACT inhaler 2 puff  2 puff Inhalation BID Danford, Earl Lites, MD      . OLANZapine (ZYPREXA) tablet 5 mg  5 mg Oral QHS Danford, Earl Lites, MD   5 mg at 04/07/17 2323  . ondansetron (ZOFRAN) tablet 4 mg  4 mg Oral Q6H PRN Danford, Earl Lites, MD       Or  . ondansetron (ZOFRAN) injection 4 mg  4 mg Intravenous Q6H PRN Danford, Earl Lites, MD      . pantoprazole (PROTONIX) EC tablet 40 mg  40 mg Oral QAC breakfast Danford, Christopher P, MD      . temazepam (RESTORIL) capsule 30 mg  30 mg Oral QHS PRN Danford, Earl Lites, MD      . tiotropium (SPIRIVA) inhalation capsule 18 mcg  18 mcg Inhalation Daily Danford, Earl Lites, MD        Allergies as of 04/07/2017 - Review Complete 04/07/2017  Allergen Reaction Noted  . Augmentin [amoxicillin-pot clavulanate] Rash and Other (See Comments) 12/16/2011  . Ace inhibitors Hives 05/05/2013    Past Medical History:  Diagnosis Date  . Acute blood loss anemia 02/07/2017  . Anxiety   . Arthritis    deg disease, bulging disk,  shoulder level  . Bipolar disorder (HCC)   . Bipolar disorder (HCC)   . 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 Depression    anxiety  . Hyperlipidemia   . Hypertension   . Peptic ulcer disease    Review  . Pneumonia   . [name]  (peripheral vascular disease) (HCC) 06/18/2015  . Shortness of breath     Past Surgical History:  Procedure Laterality Date  . BIOPSY  02/09/2017   Procedure: BIOPSY;  Surgeon: [name], MD;  Location: [facility];  Service: Endoscopy;;  duodenal gastric  . COLONOSCOPY  03/14/2007   [id number] colonoscopy and terminal ileoscopy except external hemorrhoids  . COLONOSCOPY N/A 05/05/2013   [name]: external/internal anal canal hemorrhoids, unable to intubate TI, segemental biopsies unremarkable   . COLONOSCOPY WITH PROPOFOL N/A 03/31/2017   Procedure: COLONOSCOPY WITH PROPOFOL;  Surgeon: Eula Listen  M, MD;  Location: [facility];  Service: Endoscopy;  Laterality: N/A;  1:45pm  . ESOPHAGOGASTRODUODENOSCOPY  03/14/2007   [id number] antral gastritis with bulbar duodenitis/paucity to postbulbar duodenal folds and biopsy were benign with no evidence of villous atrophy.  . ESOPHAGOGASTRODUODENOSCOPY (EGD) WITH PROPOFOL N/A 02/09/2017   Procedure: ESOPHAGOGASTRODUODENOSCOPY (EGD) WITH PROPOFOL;  Surgeon: [name], MD;  Location: [facility];  Service: Endoscopy;  Laterality: N/A;  . HAND SURGERY     left, secondary to self-inflicted laceration  . SHOULDER SURGERY     right  . TOE SURGERY     left great toe , amputated-lawnmover accident    Family History  Problem Relation Age of Onset  . Breast cancer Mother        deceased  . Heart disease Father   . Depression Daughter   . Anxiety disorder Daughter   . Anxiety disorder Son   . Depression Son   . Asthma Brother   . Heart attack Maternal Aunt   . Heart attack Maternal Uncle   . Heart attack Paternal Aunt   . Heart attack Paternal Uncle   . Heart attack Maternal Grandmother   . Heart attack Maternal Grandfather   . Emphysema Maternal Grandfather   . Heart attack Paternal Grandmother   . Heart attack Paternal Grandfather   . Colon cancer Neg Hx   . Liver disease Neg Hx     Social History   Social History   . Marital status: Divorced    Spouse name: N/A  . Number of children: 2  . Years of education: N/A   Occupational History  . Disabled    Social History Main Topics  . Smoking status: Current Every Day Smoker    Packs/day: 0.50    Years: 40.00    Types: Cigarettes    Start date: 08/04/1971  . Smokeless tobacco: Never Used     Comment: peak rate of 2.5ppd, 1/2ppd on 11/27/2016  . Alcohol use No  . Drug use: Yes    Types: Marijuana     Comment: most days   . Sexual activity: No   Other Topics Concern  . Not on file   Social History Narrative   Originally from [place]. Previously has lived in [place] [facility] [place] [facility] [place] 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 Questionable asbestos exposure. Does have significant exposure to fumes. No mold exposure. No bird exposure. No pets currently.      ROS:  General: Negative for anorexia, weight loss, fever, chills, fatigue, weakness. Eyes: Negative for vision changes.  ENT: Negative for hoarseness, difficulty swallowing , nasal congestion. CV: Negative for chest pain, angina, palpitations, dyspnea on exertion, peripheral edema.  Respiratory: Negative for dyspnea at rest, dyspnea on exertion, cough, sputum, wheezing.  GI: See history of present illness. GU:  Negative for dysuria, hematuria, urinary incontinence, urinary frequency, nocturnal urination.  MS: Negative for joint pain, low back pain.  Derm: Negative for rash or itching.  Neuro: Negative for weakness, abnormal sensation, seizure, frequent headaches, memory loss, confusion.  Psych: Negative for anxiety, depression, suicidal ideation, hallucinations.  Endo: Negative for unusual weight change.  Heme: Negative for bruising or bleeding. Allergy: Negative for rash or hives.       Physical Examination: Vital signs in last 24 hours: Temp:  [98.4 F (36.9 C)-99.2 F (37.3 C)] 98.7 F (37.1 C) (05/17 0551) Pulse Rate:  [57-75] 75  (05/17 0551) Resp:  [  17-30] 20 (05/17 0551) BP: (103-126)/(59-74) 109/60 (05/17 0551) SpO2:  [96 %-100 %] 96 % (05/17 0551) Weight:  [137 lb 4.8 oz (62.3 kg)-151 lb (68.5 kg)] 137 lb 4.8 oz (62.3 kg) (05/16 2316)    General: Well-nourished, well-developed in no acute distress.  Head: Normocephalic, atraumatic.   Eyes: Conjunctiva pink, no icterus. Mouth: Oropharyngeal mucosa moist and pink , no lesions erythema or exudate. Neck: Supple without thyromegaly, masses, or lymphadenopathy.  Lungs: Clear to auscultation bilaterally.  Heart: Regular rate and rhythm, no murmurs rubs or gallops.  Abdomen: Bowel sounds are normal, nontender, nondistended, no hepatosplenomegaly or masses, no abdominal bruits or    hernia , no rebound or guarding.   Rectal: not performed Extremities: No lower extremity edema, clubbing, deformity.  Neuro: Alert and oriented x 4 , grossly normal neurologically.  Skin: Warm and dry, no rash or jaundice.   Psych: Alert and cooperative, normal mood and affect.        Intake/Output from previous day: 05/16 0701 - 05/17 0700 In: 661.7 [I.V.:661.7] Out: -  Intake/Output this shift: No intake/output data recorded.  Lab Results: CBC  Recent Labs  04/05/17 1656 04/07/17 1434 04/08/17 0553 04/08/17 0814  WBC 5.6 6.7 4.2  --   HGB 9.0* 8.9* 7.3* 7.5*  HCT 29.8* 28.1* 24.2* 23.7*  MCV 70.6* 69.2* 70.3*  --   PLT 392 381 331  --    BMET  Recent Labs  04/07/17 1434  NA 135  K 4.0  CL 102  CO2 26  GLUCOSE 98  BUN 11  CREATININE 1.01  CALCIUM 9.1   LFT  Recent Labs  04/07/17 1434  BILITOT 0.2*  ALKPHOS 58  AST 22  ALT 11*  PROT 6.4*  ALBUMIN 3.6    Lipase No results for input(s): LIPASE in the last 72 hours.  PT/INR No results for input(s): LABPROT, INR in the last 72 hours.    Imaging Studies: Nm Gi Blood Loss  Result Date: 04/07/2017 CLINICAL DATA:  GI bleed. Gastrointestinal hemorrhage associated with other gastritis. EXAM: NUCLEAR  MEDICINE GASTROINTESTINAL BLEEDING SCAN TECHNIQUE: Sequential abdominal images were obtained following intravenous administration of Tc-43m labeled red blood cells. Imaging obtained for 2 hours. RADIOPHARMACEUTICALS:  22 mCi Tc-49m in-vitro labeled red cells. COMPARISON:  None. FINDINGS: There no scintigraphic findings to localize source of GI bleed. Central pelvic activity does not course along the course of the GI tract is likely secondary to bladder activity. Peroneal/ penile activity also noted. IMPRESSION: No scintigraphic localization of GI bleed. Electronically Signed   By: Rubye Oaks M.D.   On: 04/07/2017 23:11  [4 week]   Impression: 55 y/o male with significant IDA in the setting of ongoing moderate to large volume hematochezia. Transfused during 01/2017 hospitalization when he presented with fatigue, Hgb 7.6. Drop in Hgb over the past 10 days from 11.8 to 7.5 with active rectal bleeding. Denies melena. EGD and colonoscopy as outlined above. NM bleeding scan negative last night. Plan for capsule endoscopy today as original plan. Cannot rule out possibility of Dieulafoy's lesion in the colon.    Recommend patient to take pantoprazole once daily before breakfast given gastric erosions/duodenitis, ASA use.   Plan: 1. Pantoprazole 40mg  once daily before breakfst.  2. Small bowel capsule endoscopy today. 3. Recommend transfusion of at least one unit of blood given chronic active gi bleeding.   We would like to thank you for the opportunity to participate in the care of Christos T Mccluskey.  Verlon Au  S. Dixon Boos [facility] [phone] 5/17/20189:08 AM     LOS: 0 days

## 2017-04-08 NOTE — Care Management Note (Signed)
Case Management Note  Patient Details  Name: Jeffrey [name] MRN: [id number] Date of Birth: [birth year]  Subjective/Objective:                  Pt admitted with GIB. He is from home, ind with ADL's. He has PCP, transportaion and insurance with drug coverage. Pt has no HH or DME needs pta. Pt communicates no needs.   Action/Plan: Plan for return home with self care. No CM needs anticipated, will follow to DC.   Expected Discharge Date:       04/09/2017           Expected Discharge Plan:  Home/Self Care  In-House Referral:  NA  Discharge planning Services  CM Consult  Post Acute Care Choice:  NA Choice offered to:  NA  Status of Service:  Completed, signed off  Malcolm MetroChildress, Shelvia Fojtik Demske, RN 04/08/2017, 12:37 PM

## 2017-04-09 ENCOUNTER — Encounter (HOSPITAL_COMMUNITY): Payer: Self-pay | Admitting: Internal Medicine

## 2017-04-09 DIAGNOSIS — I739 Peripheral vascular disease, unspecified: Secondary | ICD-10-CM | POA: Diagnosis present

## 2017-04-09 DIAGNOSIS — F17200 Nicotine dependence, unspecified, uncomplicated: Secondary | ICD-10-CM | POA: Diagnosis not present

## 2017-04-09 DIAGNOSIS — D62 Acute posthemorrhagic anemia: Secondary | ICD-10-CM | POA: Diagnosis present

## 2017-04-09 DIAGNOSIS — K648 Other hemorrhoids: Secondary | ICD-10-CM | POA: Diagnosis present

## 2017-04-09 DIAGNOSIS — Z8249 Family history of ischemic heart disease and other diseases of the circulatory system: Secondary | ICD-10-CM | POA: Diagnosis not present

## 2017-04-09 DIAGNOSIS — F319 Bipolar disorder, unspecified: Secondary | ICD-10-CM | POA: Diagnosis present

## 2017-04-09 DIAGNOSIS — F3131 Bipolar disorder, current episode depressed, mild: Secondary | ICD-10-CM | POA: Diagnosis not present

## 2017-04-09 DIAGNOSIS — K644 Residual hemorrhoidal skin tags: Secondary | ICD-10-CM | POA: Diagnosis present

## 2017-04-09 DIAGNOSIS — K2961 Other gastritis with bleeding: Secondary | ICD-10-CM | POA: Diagnosis not present

## 2017-04-09 DIAGNOSIS — K269 Duodenal ulcer, unspecified as acute or chronic, without hemorrhage or perforation: Secondary | ICD-10-CM | POA: Diagnosis present

## 2017-04-09 DIAGNOSIS — D500 Iron deficiency anemia secondary to blood loss (chronic): Secondary | ICD-10-CM | POA: Diagnosis not present

## 2017-04-09 DIAGNOSIS — Z803 Family history of malignant neoplasm of breast: Secondary | ICD-10-CM | POA: Diagnosis not present

## 2017-04-09 DIAGNOSIS — Z825 Family history of asthma and other chronic lower respiratory diseases: Secondary | ICD-10-CM | POA: Diagnosis not present

## 2017-04-09 DIAGNOSIS — Z7982 Long term (current) use of aspirin: Secondary | ICD-10-CM | POA: Diagnosis not present

## 2017-04-09 DIAGNOSIS — K921 Melena: Secondary | ICD-10-CM | POA: Diagnosis not present

## 2017-04-09 DIAGNOSIS — K649 Unspecified hemorrhoids: Secondary | ICD-10-CM | POA: Diagnosis not present

## 2017-04-09 DIAGNOSIS — Z79899 Other long term (current) drug therapy: Secondary | ICD-10-CM | POA: Diagnosis not present

## 2017-04-09 DIAGNOSIS — D691 Qualitative platelet defects: Secondary | ICD-10-CM | POA: Diagnosis present

## 2017-04-09 DIAGNOSIS — J449 Chronic obstructive pulmonary disease, unspecified: Secondary | ICD-10-CM | POA: Diagnosis present

## 2017-04-09 DIAGNOSIS — K625 Hemorrhage of anus and rectum: Secondary | ICD-10-CM | POA: Diagnosis present

## 2017-04-09 DIAGNOSIS — K922 Gastrointestinal hemorrhage, unspecified: Secondary | ICD-10-CM | POA: Diagnosis not present

## 2017-04-09 DIAGNOSIS — F17210 Nicotine dependence, cigarettes, uncomplicated: Secondary | ICD-10-CM | POA: Diagnosis present

## 2017-04-09 DIAGNOSIS — E538 Deficiency of other specified B group vitamins: Secondary | ICD-10-CM | POA: Diagnosis present

## 2017-04-09 DIAGNOSIS — K2980 Duodenitis without bleeding: Secondary | ICD-10-CM | POA: Diagnosis present

## 2017-04-09 DIAGNOSIS — K319 Disease of stomach and duodenum, unspecified: Secondary | ICD-10-CM | POA: Diagnosis present

## 2017-04-09 DIAGNOSIS — Z7951 Long term (current) use of inhaled steroids: Secondary | ICD-10-CM | POA: Diagnosis not present

## 2017-04-09 LAB — HEMOGLOBIN AND HEMATOCRIT, BLOOD
HCT: 22.2 % — ABNORMAL LOW (ref 39.0–52.0)
HCT: 25.7 % — ABNORMAL LOW (ref 39.0–52.0)
Hemoglobin: 6.9 g/dL — CL (ref 13.0–17.0)
Hemoglobin: 8.4 g/dL — ABNORMAL LOW (ref 13.0–17.0)

## 2017-04-09 LAB — PLATELET FUNCTION ASSAY
Collagen / ADP: 98 seconds (ref 0–118)
Collagen / Epinephrine: 209 seconds — ABNORMAL HIGH (ref 0–193)

## 2017-04-09 LAB — PREPARE RBC (CROSSMATCH)

## 2017-04-09 MED ORDER — SODIUM CHLORIDE 0.9 % IV SOLN: Freq: Once | INTRAVENOUS | Status: DC

## 2017-04-09 NOTE — Op Note (Addendum)
I PERSONALLY REVIEWED THE GIVENS CAPSULE STUDY. AGREE WITH FINDINGS BELOW. IN ADDITIONAL ONE AVM SEEN 1:20. NO ACTIVE BLEEDING. ADVANCE DIET. PT NEEDS EVALUATION FOR BLEEDING DISORDER.   Small Bowel Givens Capsule Study Procedure date:  04/08/17  Referring Provider:  Dr. Jena Gaussourk PCP:  Dr. Andrey CampanileWilson, Gloriajean DellFred H, MD  Indication for procedure:  Significant anemia, essentially normal colonoscopy (hemorrhoids) and EGD (mild to mod erosive gastropathy and duodenitis)  Patient data:  Wt: 151 lb 12.8 oz (68.9 kg) Ht: 5\' 10"  (1.778 m) Waist: N/A  Findings:  Small bowel erosions, dark (old?) blood in visualized colon  First Gastric image:  00:00:38 First Duodenal image: 00:05:24 First Ileo-Cecal Valve image: N/A First Cecal image: 02:12:38 Gastric Passage time: 00 h 04 m Small Bowel Passage time:  02 h 07 m  Summary & Recommendations: Mild to moderate small bowel/duodenal erosions (00:05:42). Possible small bowel food specks vs very minor flecks of dried blood. No active bleeding or masses noted. No obvious acute findings.  TCS, EGD, Givens capsule with no obvious source of hematochezia and significant anemia. Minor bleed potentially from duodenitis. Likely large bowel bleeding with differentials including hemorrhoidal bleeding, non-visualized Dieulafoy lesion in the large bowel, less likely divertivular bleed (no noted diverticula on colonoscopy).  Monitor and transfuse as necessary. Proceed with scheduled hemorrhoid banding for possible clinical improvement. May benefit from hematology consult to assist with management in the meantime. May eventually need surgical consult if having persistent, active bleeding.   Thank you for allowing us to participate in the care of Jeffrey [name]  Wynne DustEric Gill, DNP, AGNP-C Adult & Gerontological Nurse Practitioner [facility]

## 2017-04-09 NOTE — Progress Notes (Signed)
Pt's hemoglobin down to 6.9, MD made aware. 1 unit PRBC's ordered.

## 2017-04-09 NOTE — Progress Notes (Signed)
Subjective: Had a bowel movement last night with associated hematochezia of a substantial amount and dripping blood from his anus after the bowel movement. Denies abdominal pain, N/V, chest pain. Is having some ongoing weakness. Denies chest pain, dizziness, syncope, near syncope, dyspnea. No other upper or lower GI symptoms today. Received a unit of blood early morning.  Objective: Vital signs in last 24 hours: Temp:  [98 F (36.7 C)-98.5 F (36.9 C)] 98.4 F (36.9 C) (05/18 0655) Pulse Rate:  [70-80] 75 (05/18 0655) Resp:  [16-19] 16 (05/18 0655) BP: (107-130)/(57-70) 115/57 (05/18 0655) SpO2:  [95 %-100 %] 95 % (05/18 6578)   General:   Alert and oriented, pleasant Head:  Normocephalic and atraumatic. Eyes:  No icterus, sclera clear. Conjuctiva pink.  Neck:  Supple, without thyromegaly or masses.  Heart:  S1, S2 present, no murmurs noted.  Lungs: Clear to auscultation bilaterally, without wheezing, rales, or rhonchi.  Abdomen:  Bowel sounds present, soft, non-tender, non-distended. No HSM or hernias noted. No rebound or guarding. No masses appreciated  Msk:  Symmetrical without gross deformities. Pulses:  Normal pulses noted. Extremities:  Without clubbing or edema. Neurologic:  Alert and  oriented x4; grossly normal neurologically. Psych:  Alert and cooperative. Normal mood and affect.  Intake/Output from previous day: 05/17 0701 - 05/18 0700 In: 1880 [P.O.:980; I.V.:900] Out: 1100 [Urine:1100] Intake/Output this shift: Total I/O In: -  Out: 400 [Urine:400]  Lab Results:  Recent Labs  04/07/17 1434 04/08/17 0553  04/08/17 1314 04/08/17 1606 04/09/17 0405  WBC 6.7 4.2  --   --   --   --   HGB 8.9* 7.3*  < > 7.2* 7.0* 6.9*  HCT 28.1* 24.2*  < > 23.7* 22.3* 22.2*  PLT 381 331  --   --   --   --   < > = values in this interval not displayed. BMET  Recent Labs  04/07/17 1434  NA 135  K 4.0  CL 102  CO2 26  GLUCOSE 98  BUN 11  CREATININE 1.01  CALCIUM  9.1   LFT  Recent Labs  04/07/17 1434  PROT 6.4*  ALBUMIN 3.6  AST 22  ALT 11*  ALKPHOS 58  BILITOT 0.2*   PT/INR No results for input(s): LABPROT, INR in the last 72 hours. Hepatitis Panel No results for input(s): HEPBSAG, HCVAB, HEPAIGM, HEPBIGM in the last 72 hours.   Studies/Results: Nm Gi Blood Loss  Result Date: 04/07/2017 CLINICAL DATA:  GI bleed. Gastrointestinal hemorrhage associated with other gastritis. EXAM: NUCLEAR MEDICINE GASTROINTESTINAL BLEEDING SCAN TECHNIQUE: Sequential abdominal images were obtained following intravenous administration of Tc-102m labeled red blood cells. Imaging obtained for 2 hours. RADIOPHARMACEUTICALS:  22 mCi Tc-78m in-vitro labeled red cells. COMPARISON:  None. FINDINGS: There no scintigraphic findings to localize source of GI bleed. Central pelvic activity does not course along the course of the GI tract is likely secondary to bladder activity. Peroneal/ penile activity also noted. IMPRESSION: No scintigraphic localization of GI bleed. Electronically Signed   By: Rubye Oaks M.D.   On: 04/07/2017 23:11    Assessment: 56 y/o male with significant IDA in the setting of ongoing moderate to large volume hematochezia. Transfused during 01/2017 hospitalization when he presented with fatigue, Hgb 7.6. Drop in Hgb over the past 10 days from 11.8 to 7.5 with active rectal bleeding. Denies melena.   Colonoscopy 03/31/17 revealing Grade II hemorrhoids, EGD 02/09/17 when patient presented to hospital with SOB, dark stools, hgb 7.6,  MCV 60s showed nonobstructing Schatzki ring, small nonbleeding erosions in the stomach, mild duodenitis with benign path. No H.pylori or celiac disease on path. He was scheduled for small bowel capsule study this week but it had to be rescheduled as patient overslept and ate breakfast.   EGD and colonoscopy as outlined above. NM bleeding scan negative 04/07/17. Capsule endoscopy completed yesterday and pending read. Cannot rule  out possibility of Dieulafoy's lesion in the colon.    Recommend patient to take pantoprazole once daily before breakfast given gastric erosions/duodenitis, ASA use.   Had another bowel movement with associated significant bleeding last night and subsequently required another unit of blood early morning for hgb 6.9. Some weakness, otherwise no anemia symptoms. Bleeding is only with or immediately after a bowel movement, no bleeding in between bowel movements. States if it wasn't for the bleeding and weakness he would feel just fine.  Capsule study reviewed and no active small bowel bleeding noted, some darkened stool in the portions of the colon able to be visualized but no active bleeding noted. Differentials include difficult to visualized bleeding colon lesion, hemorrhoids, diverticular bleed (although less likely given recent colonoscopy results).   Plan: 1. Monitor for further bleeding 2. Follow H/H closely 3. Transfuse as necessary 4. Keep plan for hemorrhoid banding 5. Regular diet 6. Supportive measures   Thank you for allowing us to participate in the care of Jeffrey [name]  Jeffrey DustEric Gill, DNP, AGNP-C Adult & Gerontological Nurse Practitioner [facility]     LOS: 0 days    04/09/2017, 8:27 AM

## 2017-04-09 NOTE — Progress Notes (Signed)
PROGRESS NOTE    Jeffrey [name]  ZOX:[id number] DOB: Feb 24, 1961 DOA: 04/07/2017 PCP: Barbie Banner, MD    Brief Narrative:  Jeffrey [name] is a 56 y.o. male with a past medical history significant for COPD, Bipolar and chronic rectal bleeding who presents with rectal bleeding and abnormal labs.  The patient has chronic rectal bleeding, in the past this has been serious enough to require transfusion. Over the last few weeks, he has had bleeding from his rectum again. He describes this as "sitting on the toilet and blood drips out".  He had labs drawn by GI two days ago that showed a 2-pt drop in Hgb.  His bleeding persisted and today he talked to them and was recommended to go to the ER.  He has had no hematemesis, melena.  He takes baby aspirin, no other platelet agents or anticoagulants.  He had a colonoscopy 1 week ago that showed hemorrhoids, was being prepped for a capsule study.  ED course: -Afebrile, heart rate 71, respirations and pulse ox normal, blood pressure 121/74 -Na 135, K 4.0, Cr 1.01, WBC 6.7K, Hgb 8.9 stable from 2 days ago -The case was discussed with gastroenterology, who recommended a tagged red cell scan -TRH were asked to admit   Assessment & Plan:   Principal Problem:   Acute blood loss anemia Active Problems:   Hematochezia   Tobacco use disorder   COPD, severe (HCC)   Bipolar disorder (HCC)   Rectal bleeding   Acute blood loss anemia from rectal bleeding:  - just had colonoscopy last week showing internal hemorrhoids and no diverticular bleeding or colonic AVM. History of AAA surgery   - NM tagged red cell scan did not show any overt source of bleeding - capsule study performed yesterday - Hold aspirin - Transfusion threshold <7 g/dL - has received 2 units thus far - Gastroenterology consulted, appreciate their recommendations   Bipolar disorder:  - Continue Depakote, Zyprexa - Continue Prozac - Continue Klonopin  COPD:  - Continue  Dulera - Continue Spiriva - Albuterol PRN   DVT prophylaxis: SCDs (cannot use lovenox or heparin given GI bleed) Code Status: FULL  Family Communication: no family bedside Disposition Plan: likely home after workup complete    Consultants:   GI  Procedures:   Tagged red blood scan  Antimicrobials:   none    Subjective: Patient reports another large bloody bowel movement last night.  States he filled up the toilet with mostly blood.  Wants to eat lunch- states he hasn't eaten in 3 days.  Objective: Vitals:   04/09/17 0655 04/09/17 0804 04/09/17 0812 04/09/17 0928  BP: (!) 115/57   (!) 105/52  Pulse: 75   78  Resp: 16   18  Temp: 98.4 F (36.9 C)   98.7 F (37.1 C)  TempSrc: Oral     SpO2: 99% 95% 95% 99%  Weight:      Height:        Intake/Output Summary (Last 24 hours) at 04/09/17 1154 Last data filed at 04/09/17 0928  Gross per 24 hour  Intake             2216 ml  Output             1500 ml  Net              716 ml   Filed Weights   04/07/17 1426 04/07/17 2316  Weight: 68.5 kg (151 lb) 62.3 kg (137 lb 4.8  oz)    Examination:  General exam: Appears calm and comfortable  Respiratory system: Clear to auscultation. Respiratory effort normal. Cardiovascular system: S1 & S2 heard, RRR. No JVD, murmurs, rubs, gallops or clicks. No pedal edema. Gastrointestinal system: Abdomen is nondistended, soft and nontender. No organomegaly or masses felt. Normal bowel sounds heard. Central nervous system: Alert and oriented. No focal neurological deficits. Extremities: Symmetric 5 x 5 power. Skin: No rashes, lesions or ulcers Psychiatry: Judgement and insight appear normal. Mood & affect appropriate.     Data Reviewed: I have personally reviewed following labs and imaging studies  CBC:  Recent Labs Lab 04/05/17 1656 04/07/17 1434 04/08/17 0553 04/08/17 0814 04/08/17 1314 04/08/17 1606 04/09/17 0405  WBC 5.6 6.7 4.2  --   --   --   --   NEUTROABS 3,584  4.6  --   --   --   --   --   HGB 9.0* 8.9* 7.3* 7.5* 7.2* 7.0* 6.9*  HCT 29.8* 28.1* 24.2* 23.7* 23.7* 22.3* 22.2*  MCV 70.6* 69.2* 70.3*  --   --   --   --   PLT 392 381 331  --   --   --   --    Basic Metabolic Panel:  Recent Labs Lab 04/07/17 1434  NA 135  K 4.0  CL 102  CO2 26  GLUCOSE 98  BUN 11  CREATININE 1.01  CALCIUM 9.1   GFR: Estimated Creatinine Clearance: 72 mL/min (by C-G formula based on SCr of 1.01 mg/dL). Liver Function Tests:  Recent Labs Lab 04/07/17 1434  AST 22  ALT 11*  ALKPHOS 58  BILITOT 0.2*  PROT 6.4*  ALBUMIN 3.6   No results for input(s): LIPASE, AMYLASE in the last 168 hours. No results for input(s): AMMONIA in the last 168 hours. Coagulation Profile: No results for input(s): INR, PROTIME in the last 168 hours. Cardiac Enzymes: No results for input(s): CKTOTAL, CKMB, CKMBINDEX, TROPONINI in the last 168 hours. BNP (last 3 results) No results for input(s): PROBNP in the last 8760 hours. HbA1C: No results for input(s): HGBA1C in the last 72 hours. CBG: No results for input(s): GLUCAP in the last 168 hours. Lipid Profile: No results for input(s): CHOL, HDL, LDLCALC, TRIG, CHOLHDL, LDLDIRECT in the last 72 hours. Thyroid Function Tests: No results for input(s): TSH, T4TOTAL, FREET4, T3FREE, THYROIDAB in the last 72 hours. Anemia Panel: No results for input(s): VITAMINB12, FOLATE, FERRITIN, TIBC, IRON, RETICCTPCT in the last 72 hours. Sepsis Labs: No results for input(s): PROCALCITON, LATICACIDVEN in the last 168 hours.  No results found for this or any previous visit (from the past 240 hour(s)).       Radiology Studies: Nm Gi Blood Loss  Result Date: 04/07/2017 CLINICAL DATA:  GI bleed. Gastrointestinal hemorrhage associated with other gastritis. EXAM: NUCLEAR MEDICINE GASTROINTESTINAL BLEEDING SCAN TECHNIQUE: Sequential abdominal images were obtained following intravenous administration of Tc-5870m labeled red blood cells.  Imaging obtained for 2 hours. RADIOPHARMACEUTICALS:  22 mCi Tc-7070m in-vitro labeled red cells. COMPARISON:  None. FINDINGS: There no scintigraphic findings to localize source of GI bleed. Central pelvic activity does not course along the course of the GI tract is likely secondary to bladder activity. Peroneal/ penile activity also noted. IMPRESSION: No scintigraphic localization of GI bleed. Electronically Signed   By: Rubye OaksMelanie  Ehinger M.D.   On: 04/07/2017 23:11        Scheduled Meds: . atorvastatin  20 mg Oral Daily  . clonazePAM  0.5 mg  Oral BID  . divalproex  750 mg Oral QHS  . fenofibrate  160 mg Oral Daily  . FLUoxetine  40 mg Oral BH-q7a  . mometasone-formoterol  2 puff Inhalation BID  . OLANZapine  5 mg Oral QHS  . pantoprazole  40 mg Oral QAC breakfast  . tiotropium  18 mcg Inhalation Daily   Continuous Infusions: . sodium chloride 100 mL/hr at 04/08/17 2033  . sodium chloride       LOS: 0 days    Time spent: 35 minutes    Katrinka Blazing, MD [facility] Pager [phone]  If 7PM-7AM, please contact night-coverage [email or web address] Password [facility] 04/09/2017, 11:54 AM

## 2017-04-10 DIAGNOSIS — K648 Other hemorrhoids: Principal | ICD-10-CM

## 2017-04-10 DIAGNOSIS — K921 Melena: Secondary | ICD-10-CM

## 2017-04-10 DIAGNOSIS — K2961 Other gastritis with bleeding: Secondary | ICD-10-CM

## 2017-04-10 DIAGNOSIS — K264 Chronic or unspecified duodenal ulcer with hemorrhage: Secondary | ICD-10-CM

## 2017-04-10 LAB — PROTIME-INR
INR: 1.14
Prothrombin Time: 14.6 seconds (ref 11.4–15.2)

## 2017-04-10 LAB — HEMOGLOBIN AND HEMATOCRIT, BLOOD
HCT: 24.8 % — ABNORMAL LOW (ref 39.0–52.0)
HCT: 23.2 % — ABNORMAL LOW (ref 39.0–52.0)
HCT: 23.7 % — ABNORMAL LOW (ref 39.0–52.0)
HCT: 22.8 % — ABNORMAL LOW (ref 39.0–52.0)
Hemoglobin: 8 g/dL — ABNORMAL LOW (ref 13.0–17.0)
Hemoglobin: 7.6 g/dL — ABNORMAL LOW (ref 13.0–17.0)
Hemoglobin: 7.6 g/dL — ABNORMAL LOW (ref 13.0–17.0)
Hemoglobin: 7.2 g/dL — ABNORMAL LOW (ref 13.0–17.0)

## 2017-04-10 LAB — TYPE AND SCREEN
ABO/RH(D): O POS
Antibody Screen: NEGATIVE
Unit division: 0

## 2017-04-10 LAB — BPAM RBC
Blood Product Expiration Date: 201805242359
ISSUE DATE / TIME: 201805180625
Unit Type and Rh: 5100

## 2017-04-10 NOTE — Progress Notes (Signed)
   Assessment/Plan: ADMITTED WITH RECTAL BLEEDING WHICH IS IMPROVED BUT CONTINUES. PT/INR NL.   1. AWAITING PLATELET FUNCTION ASSAY. IF NORMAL AND RECTAL BLEEDING CONTINUES, FLEX SIG WITH HEMORRHOID BANDING ON MAY 21. 2. DISCUSSED WITH PT. SUPPORTIVE CARE.    Subjective: Since I last evaluated the patient he has two episodes of rectal bleeding IN AM(5/18, 5/19). NO STOOL. NO ABDOMINAL PAIN,  NAUSEA OR VOMITING.  Objective: Vital signs in last 24 hours: Vitals:   04/09/17 2100 04/10/17 0457  BP: 123/60 112/64  Pulse: 67 77  Resp: 20 18  Temp: 98.6 F (37 C) 97.9 F (36.6 C)   General appearance: alert, cooperative and no distress Resp: clear to auscultation bilaterally Cardio: regular rate and rhythm GI: soft, non-tender; bowel sounds normal; no masses  Lab Results:    MAR 19 APR 10 MAY 7  MAY 17  MAY 18  MAY 19  Hb  10.7  9.9  9.0  7.3  6.9-->8.4 8.0  MAY 19: NR 1.14   Studies/Results: No results found.  Medications: I have reviewed the patient's current medications.   LOS: 5 days   Jonette EvaSandi Galileo Colello 05/03/2014, 2:23 PM

## 2017-04-10 NOTE — Progress Notes (Signed)
PROGRESS NOTE    Jeffrey [name]  ZOX:[id number] DOB: Feb 28, 1961 DOA: 04/07/2017 PCP: Barbie Banner, MD    Brief Narrative:  Jeffrey [name] is a 56 y.o. male with a past medical history significant for COPD, Bipolar and chronic rectal bleeding who presents with rectal bleeding and abnormal labs.  The patient has chronic rectal bleeding, in the past this has been serious enough to require transfusion. Over the last few weeks, he has had bleeding from his rectum again. He describes this as "sitting on the toilet and blood drips out".  He had labs drawn by GI two days ago that showed a 2-pt drop in Hgb.  His bleeding persisted and today he talked to them and was recommended to go to the ER.  He has had no hematemesis, melena.  He takes baby aspirin, no other platelet agents or anticoagulants.  He had a colonoscopy 1 week ago that showed hemorrhoids, was being prepped for a capsule study.  ED course: -Afebrile, heart rate 71, respirations and pulse ox normal, blood pressure 121/74 -Na 135, K 4.0, Cr 1.01, WBC 6.7K, Hgb 8.9 stable from 2 days ago -The case was discussed with gastroenterology, who recommended a tagged red cell scan -TRH were asked to admit  Patient continued to have rectal bleeding and his H/H continued to drop.  He underwent tagged red blood cell scan as well as capsule study both of which did not show cause of bleeding.  Gastroenterology ordered PT/INR both of which were normal and ordered platelet assay which is pending.   Assessment & Plan:   Principal Problem:   Acute blood loss anemia Active Problems:   Hematochezia   Tobacco use disorder   COPD, severe (HCC)   GIB (gastrointestinal bleeding)   Bipolar disorder (HCC)   Rectal bleeding   Acute blood loss anemia from rectal bleeding:  - just had colonoscopy last week showing internal hemorrhoids and no diverticular bleeding or colonic AVM. History of AAA surgery   - NM tagged red cell scan did not show any overt  source of bleeding - capsule study showing 1 AVM but no cause for H/H drop - Hold aspirin - Transfusion threshold <7 g/dL - has received 2 units thus far - Gastroenterology consulted, appreciate their recommendations - follow serial H/H   Bipolar disorder:  - Continue Depakote, Zyprexa - Continue Prozac - Continue Klonopin  COPD:  - Continue Dulera - Continue Spiriva - Albuterol PRN   DVT prophylaxis: SCDs (cannot use lovenox or heparin given GI bleed) Code Status: FULL  Family Communication: no family bedside Disposition Plan: likely home after workup complete    Consultants:   GI  Procedures:   Tagged red blood scan  Capsule study  Antimicrobials:   none    Subjective: Patient seen this morning.  States a friend of his is admitted down the hall and he walked to go see him.  He voices that after walking for that visit he was exhausted.  Did have 1 large bloody bowel movement (without stool) this am.  Objective: Vitals:   04/09/17 1930 04/09/17 2100 04/10/17 0457 04/10/17 0735  BP:  123/60 112/64   Pulse:  67 77   Resp:  20 18   Temp:  98.6 F (37 C) 97.9 F (36.6 C)   TempSrc:  Oral Oral   SpO2: 99% 100% 98% 98%  Weight:      Height:        Intake/Output Summary (Last 24 hours) at  04/10/17 1315 Last data filed at 04/10/17 1300  Gross per 24 hour  Intake              480 ml  Output              600 ml  Net             -120 ml   Filed Weights   04/07/17 1426 04/07/17 2316  Weight: 68.5 kg (151 lb) 62.3 kg (137 lb 4.8 oz)    Examination:  General exam: Appears tired Respiratory system: Clear to auscultation. Respiratory effort normal. Cardiovascular system: S1 & S2 heard, RRR. No JVD, murmurs, rubs, gallops or clicks. No pedal edema. Gastrointestinal system: Abdomen is nondistended, soft and nontender. No organomegaly or masses felt. Normal bowel sounds heard. Central nervous system: Alert and oriented. No focal neurological  deficits. Extremities: Symmetric 5 x 5 power. Skin: No rashes, lesions or ulcers Psychiatry: Judgement and insight appear normal. Mood & affect appropriate.      Data Reviewed: I have personally reviewed following labs and imaging studies  CBC:  Recent Labs Lab 04/05/17 1656 04/07/17 1434 04/08/17 0553  04/08/17 1314 04/08/17 1606 04/09/17 0405 04/09/17 1610 04/10/17 0406  WBC 5.6 6.7 4.2  --   --   --   --   --   --   NEUTROABS 3,584 4.6  --   --   --   --   --   --   --   HGB 9.0* 8.9* 7.3*  < > 7.2* 7.0* 6.9* 8.4* 8.0*  HCT 29.8* 28.1* 24.2*  < > 23.7* 22.3* 22.2* 25.7* 24.8*  MCV 70.6* 69.2* 70.3*  --   --   --   --   --   --   PLT 392 381 331  --   --   --   --   --   --   < > = values in this interval not displayed. Basic Metabolic Panel:  Recent Labs Lab 04/07/17 1434  NA 135  K 4.0  CL 102  CO2 26  GLUCOSE 98  BUN 11  CREATININE 1.01  CALCIUM 9.1   GFR: Estimated Creatinine Clearance: 72 mL/min (by C-G formula based on SCr of 1.01 mg/dL). Liver Function Tests:  Recent Labs Lab 04/07/17 1434  AST 22  ALT 11*  ALKPHOS 58  BILITOT 0.2*  PROT 6.4*  ALBUMIN 3.6   No results for input(s): LIPASE, AMYLASE in the last 168 hours. No results for input(s): AMMONIA in the last 168 hours. Coagulation Profile:  Recent Labs Lab 04/10/17 0406  INR 1.14   Cardiac Enzymes: No results for input(s): CKTOTAL, CKMB, CKMBINDEX, TROPONINI in the last 168 hours. BNP (last 3 results) No results for input(s): PROBNP in the last 8760 hours. HbA1C: No results for input(s): HGBA1C in the last 72 hours. CBG: No results for input(s): GLUCAP in the last 168 hours. Lipid Profile: No results for input(s): CHOL, HDL, LDLCALC, TRIG, CHOLHDL, LDLDIRECT in the last 72 hours. Thyroid Function Tests: No results for input(s): TSH, T4TOTAL, FREET4, T3FREE, THYROIDAB in the last 72 hours. Anemia Panel: No results for input(s): VITAMINB12, FOLATE, FERRITIN, TIBC, IRON,  RETICCTPCT in the last 72 hours. Sepsis Labs: No results for input(s): PROCALCITON, LATICACIDVEN in the last 168 hours.  No results found for this or any previous visit (from the past 240 hour(s)).       Radiology Studies: No results found.      Scheduled Meds: .  atorvastatin  20 mg Oral Daily  . clonazePAM  0.5 mg Oral BID  . divalproex  750 mg Oral QHS  . fenofibrate  160 mg Oral Daily  . FLUoxetine  40 mg Oral BH-q7a  . mometasone-formoterol  2 puff Inhalation BID  . OLANZapine  5 mg Oral QHS  . pantoprazole  40 mg Oral QAC breakfast  . tiotropium  18 mcg Inhalation Daily   Continuous Infusions: . sodium chloride 100 mL/hr at 04/10/17 1005  . sodium chloride       LOS: 1 day    Time spent: 30 minutes    Jeffrey BlazingAlex U Kadolph, MD [facility] Pager [phone]  If 7PM-7AM, please contact night-coverage [email or web address] Password [email or web address] 04/10/2017, 1:15 PM

## 2017-04-11 ENCOUNTER — Encounter (HOSPITAL_COMMUNITY): Payer: Self-pay | Admitting: *Deleted

## 2017-04-11 ENCOUNTER — Inpatient Hospital Stay (HOSPITAL_COMMUNITY)
Admission: EM | Disposition: A | Discharge status: Home / Self Care | Payer: Self-pay | Source: Home / Self Care | Attending: Family Medicine

## 2017-04-11 ENCOUNTER — Encounter (HOSPITAL_COMMUNITY): Payer: Self-pay | Admitting: Anesthesiology

## 2017-04-11 HISTORY — PX: COLONOSCOPY: SHX5424

## 2017-04-11 LAB — HEMOGLOBIN AND HEMATOCRIT, BLOOD
HCT: 21.2 % — ABNORMAL LOW (ref 39.0–52.0)
HCT: 24.5 % — ABNORMAL LOW (ref 39.0–52.0)
Hemoglobin: 6.6 g/dL — CL (ref 13.0–17.0)
Hemoglobin: 7.9 g/dL — ABNORMAL LOW (ref 13.0–17.0)

## 2017-04-11 LAB — PREPARE RBC (CROSSMATCH)

## 2017-04-11 SURGERY — COLONOSCOPY
Anesthesia: Moderate Sedation

## 2017-04-11 MED ORDER — SODIUM CHLORIDE 0.9 % IV SOLN
INTRAVENOUS | Status: AC | PRN
  Administered 2017-04-11: 450 mL via INTRAMUSCULAR

## 2017-04-11 MED ORDER — MEPERIDINE HCL 100 MG/ML IJ SOLN
INTRAMUSCULAR | Status: DC | PRN
  Administered 2017-04-11 (×3): 25 mg via INTRAVENOUS

## 2017-04-11 MED ORDER — MIDAZOLAM HCL 5 MG/5ML IJ SOLN
INTRAMUSCULAR | Status: AC
  Filled 2017-04-11: qty 10

## 2017-04-11 MED ORDER — SODIUM CHLORIDE 0.9 % IV SOLN
INTRAVENOUS | Status: DC
  Administered 2017-04-11: 1000 mL via INTRAVENOUS

## 2017-04-11 MED ORDER — SIMETHICONE 40 MG/0.6ML PO SUSP
ORAL | Status: DC | PRN
  Administered 2017-04-11: 12:00:00

## 2017-04-11 MED ORDER — MIDAZOLAM HCL 5 MG/5ML IJ SOLN
INTRAMUSCULAR | Status: DC | PRN
  Administered 2017-04-11: 2 mg via INTRAVENOUS
  Administered 2017-04-11: 1 mg via INTRAVENOUS
  Administered 2017-04-11: 2 mg via INTRAVENOUS

## 2017-04-11 MED ORDER — MEPERIDINE HCL 100 MG/ML IJ SOLN
INTRAMUSCULAR | Status: AC
  Filled 2017-04-11: qty 2

## 2017-04-11 MED ORDER — SODIUM CHLORIDE 0.9 % IV SOLN
Freq: Once | INTRAVENOUS | Status: AC
  Administered 2017-04-11: 06:00:00 via INTRAVENOUS

## 2017-04-11 MED ORDER — SIMETHICONE 40 MG/0.6ML PO SUSP
ORAL | Status: AC
  Filled 2017-04-11: qty 30

## 2017-04-11 NOTE — H&P (View-Only) (Signed)
   Assessment/Plan: ADMITTED WITH RECTAL BLEEDING WHICH IS IMPROVED BUT CONTINUES. PT/INR NL.   1. AWAITING PLATELET FUNCTION ASSAY. IF NORMAL AND RECTAL BLEEDING CONTINUES, FLEX SIG WITH HEMORRHOID BANDING ON MAY 21. 2. DISCUSSED WITH PT. SUPPORTIVE CARE.    Subjective: Since I last evaluated the patient he has two episodes of rectal bleeding IN AM(5/18, 5/19). NO STOOL. NO ABDOMINAL PAIN,  NAUSEA OR VOMITING.  Objective: Vital signs in last 24 hours: Vitals:   04/09/17 2100 04/10/17 0457  BP: 123/60 112/64  Pulse: 67 77  Resp: 20 18  Temp: 98.6 F (37 C) 97.9 F (36.6 C)   General appearance: alert, cooperative and no distress Resp: clear to auscultation bilaterally Cardio: regular rate and rhythm GI: soft, non-tender; bowel sounds normal; no masses  Lab Results:    MAR 19 APR 10 MAY 7  MAY 17  MAY 18  MAY 19  Hb  10.7  9.9  9.0  7.3  6.9-->8.4 8.0  MAY 19: NR 1.14   Studies/Results: No results found.  Medications: I have reviewed the patient's current medications.   LOS: 5 days   Tyrell Brereton 05/03/2014, 2:23 PM   

## 2017-04-11 NOTE — Progress Notes (Signed)
  Assessment/Plan: Admitted with rectal bleeding. continues with bleeding and transfusion dependent anemia. NEEDS FLEX SIG TODAY. CONSIDER HEMORRHOID BANDING. IF NEGATIVE PT NEEDS A MECKEL'S SCAN.  PLAN: 1. NPO EXCEPT MEDS 2. FLEX SIG @ 1200 TODAY. DISCUSSED PROCEDURE, BENEFITS, & RISKS: < 1% chance of medication reaction, BLEEDING, OR perforation. 3. PROTONIX DAILY. 4. CONSIDER MECKEL'S SCAN.

## 2017-04-11 NOTE — Interval H&P Note (Signed)
History and Physical Interval Note:  04/11/2017 11:51 AM  Jeffrey GriffonEdward T Osuch  has presented today for surgery, with the diagnosis of rectal bleeding  The various methods of treatment have been discussed with the patient and family. After consideration of risks, benefits and other options for treatment, the patient has consented to  Procedure(s): FLEXIBLE SIGMOIDOSCOPY (N/A) HEMORRHOID BANDING (N/A) as a surgical intervention .  The patient's history has been reviewed, patient examined, no change in status, stable for surgery.  I have reviewed the patient's chart and labs.  Questions were answered to the patient's satisfaction.     Eaton CorporationSandi Dallan Schonberg

## 2017-04-11 NOTE — Progress Notes (Signed)
PROGRESS NOTE    Jeffrey [name]  ZOX:[id number] DOB: 1961-03-08 DOA: 04/07/2017 PCP: Barbie Banner, MD    Brief Narrative:  Jeffrey [name] is a 56 y.o. male with a past medical history significant for COPD, Bipolar and chronic rectal bleeding who presents with rectal bleeding and abnormal labs.  The patient has chronic rectal bleeding, in the past this has been serious enough to require transfusion. Over the last few weeks, he has had bleeding from his rectum again. He describes this as "sitting on the toilet and blood drips out".  He had labs drawn by GI two days ago that showed a 2-pt drop in Hgb.  His bleeding persisted and today he talked to them and was recommended to go to the ER.  He has had no hematemesis, melena.  He takes baby aspirin, no other platelet agents or anticoagulants.  He had a colonoscopy 1 week ago that showed hemorrhoids, was being prepped for a capsule study.  In the ED patient was found to be Afebrile, heart rate 71, respirations and pulse ox normal, blood pressure 121/74. Na 135, K 4.0, Cr 1.01, WBC 6.7K, Hgb 8.9 stable from 2 days ago. The case was discussed with gastroenterology, who recommended a tagged red cell scan and TRH were asked to admit  Patient continued to have rectal bleeding and his H/H continued to drop.  He underwent tagged red blood cell scan as well as capsule study both of which did not show cause of bleeding.  Gastroenterology ordered PT/INR both of which were normal and ordered platelet assay which is pending.   Assessment & Plan:   Principal Problem:   Acute blood loss anemia Active Problems:   Hematochezia   Tobacco use disorder   COPD, severe (HCC)   GIB (gastrointestinal bleeding)   Bipolar disorder (HCC)   Rectal bleeding   Acute blood loss anemia from rectal bleeding:  - just had colonoscopy last week showing internal hemorrhoids and no diverticular bleeding or colonic AVM. History of AAA surgery   - NM tagged red cell scan did  not show any overt source of bleeding - capsule study showing 1 AVM but no cause for H/H drop - Holding aspirin - Transfusion threshold <7 g/dL - H/H decreased again- hemoglobin below 7 so additional PRBC ordered - about to receive third unit of PRBC - Gastroenterology consulted, appreciate their recommendations - follow serial H/H - patient to undergo flexible sigmoidoscopy today   Bipolar disorder:  - Continue Depakote, Zyprexa - Continue Prozac - Continue Klonopin  COPD:  - Continue Dulera - Continue Spiriva - Albuterol PRN   DVT prophylaxis: SCDs (cannot use lovenox or heparin given GI bleed) Code Status: FULL  Family Communication: no family bedside Disposition Plan: likely home after workup complete    Consultants:   GI  Procedures:   Tagged red blood scan  Capsule study  Antimicrobials:   none    Subjective: Patient had a large bloody bowel movement last evening.  His H/H dropped again this morning and a unit of PRBC was ordered.  Patient reports that he feels tired and cold.  He just had a tap water enema and mostly blood came out.    Objective: Vitals:   04/10/17 2012 04/10/17 2149 04/11/17 0602 04/11/17 0722  BP:  134/71 133/74   Pulse:  89 81   Resp:  18 18   Temp:  98.9 F (37.2 C) 98.4 F (36.9 C)   TempSrc:  Oral Oral  SpO2: 99% 100% 100% 96%  Weight:      Height:        Intake/Output Summary (Last 24 hours) at 04/11/17 1019 Last data filed at 04/11/17 [id number]  Gross per 24 hour  Intake          7043.33 ml  Output                0 ml  Net          7043.33 ml   Filed Weights   04/07/17 1426 04/07/17 2316  Weight: 68.5 kg (151 lb) 62.3 kg (137 lb 4.8 oz)    Examination:  General exam: mild distress, pale, appears fatigued Respiratory system: Clear to auscultation. Respiratory effort normal. Cardiovascular system: S1 & S2 heard, RRR. No JVD, murmurs, rubs, gallops or clicks. No pedal edema. Gastrointestinal system: Abdomen is  nondistended, soft and nontender. No organomegaly or masses felt. Normal bowel sounds heard. Central nervous system: Alert and oriented. No focal neurological deficits. Extremities: Symmetric 5 x 5 power. Skin: No rashes, lesions or ulcers Psychiatry: Judgement and insight appear normal. Mood & affect appropriate   Data Reviewed: I have personally reviewed following labs and imaging studies  CBC:  Recent Labs Lab 04/05/17 1656 04/07/17 1434 04/08/17 0553  04/10/17 0406 04/10/17 1352 04/10/17 1725 04/10/17 2139 04/11/17 0421  WBC 5.6 6.7 4.2  --   --   --   --   --   --   NEUTROABS 3,584 4.6  --   --   --   --   --   --   --   HGB 9.0* 8.9* 7.3*  < > 8.0* 7.6* 7.6* 7.2* 6.6*  HCT 29.8* 28.1* 24.2*  < > 24.8* 23.2* 23.7* 22.8* 21.2*  MCV 70.6* 69.2* 70.3*  --   --   --   --   --   --   PLT 392 381 331  --   --   --   --   --   --   < > = values in this interval not displayed. Basic Metabolic Panel:  Recent Labs Lab 04/07/17 1434  NA 135  K 4.0  CL 102  CO2 26  GLUCOSE 98  BUN 11  CREATININE 1.01  CALCIUM 9.1   GFR: Estimated Creatinine Clearance: 72 mL/min (by C-G formula based on SCr of 1.01 mg/dL). Liver Function Tests:  Recent Labs Lab 04/07/17 1434  AST 22  ALT 11*  ALKPHOS 58  BILITOT 0.2*  PROT 6.4*  ALBUMIN 3.6   No results for input(s): LIPASE, AMYLASE in the last 168 hours. No results for input(s): AMMONIA in the last 168 hours. Coagulation Profile:  Recent Labs Lab 04/10/17 0406  INR 1.14   Cardiac Enzymes: No results for input(s): CKTOTAL, CKMB, CKMBINDEX, TROPONINI in the last 168 hours. BNP (last 3 results) No results for input(s): PROBNP in the last 8760 hours. HbA1C: No results for input(s): HGBA1C in the last 72 hours. CBG: No results for input(s): GLUCAP in the last 168 hours. Lipid Profile: No results for input(s): CHOL, HDL, LDLCALC, TRIG, CHOLHDL, LDLDIRECT in the last 72 hours. Thyroid Function Tests: No results for  input(s): TSH, T4TOTAL, FREET4, T3FREE, THYROIDAB in the last 72 hours. Anemia Panel: No results for input(s): VITAMINB12, FOLATE, FERRITIN, TIBC, IRON, RETICCTPCT in the last 72 hours. Sepsis Labs: No results for input(s): PROCALCITON, LATICACIDVEN in the last 168 hours.  No results found for this or any previous visit (from the  past 240 hour(s)).       Radiology Studies: No results found.      Scheduled Meds: . atorvastatin  20 mg Oral Daily  . clonazePAM  0.5 mg Oral BID  . divalproex  750 mg Oral QHS  . fenofibrate  160 mg Oral Daily  . FLUoxetine  40 mg Oral BH-q7a  . mometasone-formoterol  2 puff Inhalation BID  . OLANZapine  5 mg Oral QHS  . pantoprazole  40 mg Oral QAC breakfast  . tiotropium  18 mcg Inhalation Daily   Continuous Infusions: . sodium chloride 100 mL/hr at 04/10/17 1941  . sodium chloride       LOS: 2 days    Time spent: 30 minutes    Katrinka Blazing, MD [facility] Pager [phone]  If 7PM-7AM, please contact night-coverage [email or web address] Password [email or web address] 04/11/2017, 10:19 AM

## 2017-04-11 NOTE — Op Note (Addendum)
[facility] Patient Name: Jeffrey [name] Procedure Date: 04/11/2017 11:05 AM MRN: [id number] Date of Birth: 16-Dec-1960 Attending MD: Jonette Eva , MD CSN: [id number] Age: 56 Admit Type: Inpatient Procedure:                Colonoscopy Indications:              Hematochezia Providers:                Jonette Eva, MD, Loma Messing B. Patsy Lager, RN, [place] Scrape RN, RN Referring MD:             Gloriajean Dell. Wilson Medicines:                Meperidine 75 mg IV, Midazolam 5 mg IV Complications:            No immediate complications. Estimated Blood Loss:     Estimated blood loss: none. Procedure:                Pre-Anesthesia Assessment:                           - Prior to the procedure, a History and Physical                            was performed, and patient medications and                            allergies were reviewed. The patient's tolerance of                            previous anesthesia was also reviewed. The risks                            and benefits of the procedure and the sedation                            options and risks were discussed with the patient.                            All questions were answered, and informed consent                            was obtained. Prior Anticoagulants: The patient has                            taken no previous anticoagulant or antiplatelet                            agents. ASA Grade Assessment: II - A patient with                            mild systemic disease. After reviewing the risks  and benefits, the patient was deemed in                            satisfactory condition to undergo the procedure.                           After obtaining informed consent, the colonoscope                            was passed under direct vision. Throughout the                            procedure, the patient's blood pressure, pulse, and                            oxygen  saturations were monitored continuously. The                            [id number] ([id number]) scope was introduced through                            the anus and advanced to the 5 cm into the ileum.                            The was introduced through the anus and advanced to                            the. After obtaining informed consent, the                            colonoscope was passed under direct vision.                            Throughout the procedure, the patient's blood                            pressure, pulse, and oxygen saturations were                            monitored continuously.The colonoscopy was                            performed without difficulty. The patient tolerated                            the procedure well. The quality of the bowel                            preparation was good. The terminal ileum, ileocecal                            valve, appendiceal orifice, and rectum were  photographed. Scope In: 12:36:24 PM Scope Out: 12:46:57 PM Scope Withdrawal Time: 0 hours 7 minutes 29 seconds  Total Procedure Duration: 0 hours 10 minutes 33 seconds  Findings:      The terminal ileum appeared normal.      The colon (entire examined portion) appeared normal. NO OLD BLOOD OR       FRESH BLOOD SEEN IN ILEUM OR COLON.      Non-bleeding external and internal hemorrhoids were found during       retroflexion and during perianal exam. The hemorrhoids were large. Impression:               - The examined portion of the ileum was normal.                           - The entire examined colon is normal.                           - Non-bleeding external and internal hemorrhoids.                           - RECTAL BLEEDING DUE TO HEMORRHOIDAL BLEEDING in                            setting of POSSIBLE BLEEDING DISORDER, LESS LIKELY                            MECKEL'S DIVERTICULUM Moderate Sedation:      Moderate (conscious) sedation was  administered by the endoscopy nurse       and supervised by the endoscopist. The following parameters were       monitored: oxygen saturation, heart rate, blood pressure, and response       to care. Total physician intraservice time was 22 minutes. Recommendation:           - Cardiac diet. MECKEL'S SCAN TODAY.                           - Continue present medications.                           - Refer to a surgeon FOR HEMORRHOIDECTOMY.                            DISCUSSED WITH DR. Lovell Sheehan.                           - Refer to a hematologist TO EVALUATE FOR BLEDING                            DISORDER. DISCUSSED WITH DR. Mosetta Putt. RECOMMENDED VON                            [place] PANEL. PANEL ORDERED.                           - Return patient to hospital ward for ongoing care.                           -  Repeat colonoscopy in 10 years for surveillance. Procedure Code(s):        --- Professional ---                           [phone], Colonoscopy, flexible; diagnostic, including                            collection of specimen(s) by brushing or washing,                            when performed (separate procedure)                           99152, Moderate sedation services provided by the                            same physician or other qualified health care                            professional performing the diagnostic or                            therapeutic service that the sedation supports,                            requiring the presence of an independent trained                            observer to assist in the monitoring of the                            patient's level of consciousness and physiological                            status; initial 15 minutes of intraservice time,                            patient age 62 years or older Diagnosis Code(s):        --- Professional ---                           K64.8, Other hemorrhoids                           K92.1, Melena  (includes Hematochezia) CPT copyright 2016 American Medical Association. All rights reserved. The codes documented in this report are preliminary and upon coder review may  be revised to meet current compliance requirements. Jonette Eva, MD Jonette Eva, MD 04/11/2017 1:20:24 PM This report has been signed electronically. Number of Addenda: 0

## 2017-04-12 ENCOUNTER — Inpatient Hospital Stay (HOSPITAL_COMMUNITY): Payer: Medicare Other

## 2017-04-12 ENCOUNTER — Encounter (HOSPITAL_COMMUNITY): Payer: Self-pay | Admitting: Gastroenterology

## 2017-04-12 DIAGNOSIS — K922 Gastrointestinal hemorrhage, unspecified: Secondary | ICD-10-CM

## 2017-04-12 DIAGNOSIS — K649 Unspecified hemorrhoids: Secondary | ICD-10-CM

## 2017-04-12 DIAGNOSIS — D500 Iron deficiency anemia secondary to blood loss (chronic): Secondary | ICD-10-CM

## 2017-04-12 DIAGNOSIS — H547 Unspecified visual loss: Secondary | ICD-10-CM

## 2017-04-12 DIAGNOSIS — E538 Deficiency of other specified B group vitamins: Secondary | ICD-10-CM

## 2017-04-12 LAB — IRON AND TIBC
Iron: 13 ug/dL — ABNORMAL LOW (ref 45–182)
Saturation Ratios: 3 % — ABNORMAL LOW (ref 17.9–39.5)
TIBC: 420 ug/dL (ref 250–450)
UIBC: 407 ug/dL

## 2017-04-12 LAB — COAG STUDIES INTERP REPORT

## 2017-04-12 LAB — VON WILLEBRAND PANEL
Coagulation Factor VIII: 122 % (ref 57–163)
Ristocetin Co-factor, Plasma: 109 % (ref 50–200)
Von Willebrand Antigen, Plasma: 148 % (ref 50–200)

## 2017-04-12 LAB — FERRITIN: Ferritin: 13 ng/mL — ABNORMAL LOW (ref 24–336)

## 2017-04-12 LAB — HEMOGLOBIN AND HEMATOCRIT, BLOOD
HCT: 23.9 % — ABNORMAL LOW (ref 39.0–52.0)
HCT: 25.6 % — ABNORMAL LOW (ref 39.0–52.0)
Hemoglobin: 7.8 g/dL — ABNORMAL LOW (ref 13.0–17.0)
Hemoglobin: 8.3 g/dL — ABNORMAL LOW (ref 13.0–17.0)

## 2017-04-12 LAB — VITAMIN B12: Vitamin B-12: 346 pg/mL (ref 180–914)

## 2017-04-12 LAB — RETICULOCYTES
RBC.: 3.55 MIL/uL — ABNORMAL LOW (ref 4.22–5.81)
Retic Count, Absolute: 78.1 10*3/uL (ref 19.0–186.0)
Retic Ct Pct: 2.2 % (ref 0.4–3.1)

## 2017-04-12 LAB — FOLATE: Folate: 5.9 ng/mL — ABNORMAL LOW (ref >5.9)

## 2017-04-12 MED ORDER — SODIUM PERTECHNETATE TC 99M INJECTION
10.0000 | Freq: Once | INTRAVENOUS | Status: AC | PRN
  Administered 2017-04-12: 9 via INTRAVENOUS

## 2017-04-12 MED ORDER — CYANOCOBALAMIN 1000 MCG/ML IJ SOLN
1000.0000 ug | Freq: Once | INTRAMUSCULAR | Status: AC
  Administered 2017-04-12: 1000 ug via INTRAMUSCULAR
  Filled 2017-04-12: qty 1

## 2017-04-12 MED ORDER — FERUMOXYTOL INJECTION 510 MG/17 ML
510.0000 mg | Freq: Once | INTRAVENOUS | Status: AC
Start: 2017-04-12 — End: 2017-04-12
  Administered 2017-04-12: 510 mg via INTRAVENOUS
  Filled 2017-04-12: qty 17

## 2017-04-12 MED ORDER — SODIUM CHLORIDE 0.9 % IV SOLN: Freq: Once | INTRAVENOUS | Status: DC

## 2017-04-12 MED ORDER — FOLIC ACID 1 MG PO TABS
1.0000 mg | ORAL_TABLET | Freq: Every day | ORAL | Status: DC
  Administered 2017-04-12 – 2017-04-15 (×4): 1 mg via ORAL
  Filled 2017-04-12 (×4): qty 1

## 2017-04-12 NOTE — Progress Notes (Signed)
PROGRESS NOTE    Jeffrey [name]  FAO:[id number] DOB: 07-22-1961 DOA: 04/07/2017 PCP: Barbie BannerWilson, Fred H, MD    Brief Narrative:  Jeffrey Griffondward T Tuman is a 56 y.o. male with a past medical history significant for COPD, Bipolar and chronic rectal bleeding who presents with rectal bleeding and abnormal labs.  The patient has chronic rectal bleeding, in the past this has been serious enough to require transfusion. Over the last few weeks, he has had bleeding from his rectum again. He describes this as "sitting on the toilet and blood drips out".  He had labs drawn by GI two days ago that showed a 2-pt drop in Hgb.  His bleeding persisted and today he talked to them and was recommended to go to the ER.  He has had no hematemesis, melena.  He takes baby aspirin, no other platelet agents or anticoagulants.  He had a colonoscopy 1 week ago that showed hemorrhoids, was being prepped for a capsule study.  In the ED patient was found to be Afebrile, heart rate 71, respirations and pulse ox normal, blood pressure 121/74. Na 135, K 4.0, Cr 1.01, WBC 6.7K, Hgb 8.9 stable from 2 days ago. The case was discussed with gastroenterology, who recommended a tagged red cell scan and TRH were asked to admit  Patient continued to have rectal bleeding and his H/H continued to drop.  He underwent tagged red blood cell scan as well as capsule study both of which did not show cause of bleeding.  Gastroenterology ordered PT/INR both of which were normal and ordered platelet assay which is pending.   Assessment & Plan:   Principal Problem:   Acute blood loss anemia Active Problems:   Hematochezia   Tobacco use disorder   COPD, severe (HCC)   GIB (gastrointestinal bleeding)   Bipolar disorder (HCC)   Rectal bleeding   Acute blood loss anemia from rectal bleeding:  - NM tagged red cell scan did not show any overt source of bleeding - capsule study showing 1 AVM but no cause for H/H drop - Meckel scan negative for  meckel's - Holding aspirin - Transfusion threshold <7 g/dL - thus far s/p 3 unit PRBC - Gastroenterology consulted, appreciate their recommendations - follow serial H/H - Gen Surg consulted - Hematology consulted  Bipolar disorder:  - Continue Depakote, Zyprexa - Continue Prozac - Continue Klonopin  COPD:  - Continue Dulera - Continue Spiriva - Albuterol PRN  Vision loss: - CT of head negative - will order MRI - cannot give aspirin due to GIB   DVT prophylaxis: SCDs (cannot use lovenox or heparin given GI bleed) Code Status: FULL  Family Communication: no family bedside Disposition Plan: likely home after workup complete    Consultants:   GI  Gen Surg  Hematology  Procedures:   Tagged red blood scan  Capsule study  Flexible sigmoidoscopy  Meckel's scan  Antimicrobials:   none    Subjective: Patient reports 1 bowel movement of blood since colonoscopy.  Says he has not had any bowel movements today.  Still feeling very weak and lightheaded with ambulation.  No abdominal pain.  Objective: Vitals:   04/12/17 0639 04/12/17 0752 04/12/17 0754 04/12/17 1352  BP: (!) 127/50   (!) 113/58  Pulse: 75   62  Resp: 20   19  Temp: 98 F (36.7 C)   98.5 F (36.9 C)  TempSrc: Oral   Oral  SpO2: 99% 98% 98% 100%  Weight:  Height:        Intake/Output Summary (Last 24 hours) at 04/12/17 1422 Last data filed at 04/12/17 0835  Gross per 24 hour  Intake           1552.5 ml  Output              700 ml  Net            852.5 ml   Filed Weights   04/07/17 1426 04/07/17 2316 04/11/17 1203  Weight: 68.5 kg (151 lb) 62.3 kg (137 lb 4.8 oz) 59 kg (130 lb)    Examination:  General exam: appears tired, pale, Respiratory system: Clear to auscultation. Respiratory effort normal. Cardiovascular system:S1 &S2 heard, RRR. No JVD, murmurs, rubs, gallops or clicks. No pedal edema. Gastrointestinal system:Abdomen is nondistended, soft and nontender. No  organomegaly or masses felt. Normal bowel sounds heard. Central nervous system:Alert and oriented. No focal neurological deficits. Visual fields normal and intact bilaterally Extremities: Symmetric 5 x 5 power. Skin: No rashes, lesions or ulcers Psychiatry:Mood &affect appropriate.  Good insight, reliable historian  Data Reviewed: I have personally reviewed following labs and imaging studies  CBC:  Recent Labs Lab 04/05/17 1656 04/07/17 1434 04/08/17 0553  04/10/17 1725 04/10/17 2139 04/11/17 0421 04/11/17 1539 04/12/17 0405  WBC 5.6 6.7 4.2  --   --   --   --   --   --   NEUTROABS 3,584 4.6  --   --   --   --   --   --   --   HGB 9.0* 8.9* 7.3*  < > 7.6* 7.2* 6.6* 7.9* 7.8*  HCT 29.8* 28.1* 24.2*  < > 23.7* 22.8* 21.2* 24.5* 23.9*  MCV 70.6* 69.2* 70.3*  --   --   --   --   --   --   PLT 392 381 331  --   --   --   --   --   --   < > = values in this interval not displayed. Basic Metabolic Panel:  Recent Labs Lab 04/07/17 1434  NA 135  K 4.0  CL 102  CO2 26  GLUCOSE 98  BUN 11  CREATININE 1.01  CALCIUM 9.1   GFR: Estimated Creatinine Clearance: 68.2 mL/min (by C-G formula based on SCr of 1.01 mg/dL). Liver Function Tests:  Recent Labs Lab 04/07/17 1434  AST 22  ALT 11*  ALKPHOS 58  BILITOT 0.2*  PROT 6.4*  ALBUMIN 3.6   No results for input(s): LIPASE, AMYLASE in the last 168 hours. No results for input(s): AMMONIA in the last 168 hours. Coagulation Profile:  Recent Labs Lab 04/10/17 0406  INR 1.14   Cardiac Enzymes: No results for input(s): CKTOTAL, CKMB, CKMBINDEX, TROPONINI in the last 168 hours. BNP (last 3 results) No results for input(s): PROBNP in the last 8760 hours. HbA1C: No results for input(s): HGBA1C in the last 72 hours. CBG: No results for input(s): GLUCAP in the last 168 hours. Lipid Profile: No results for input(s): CHOL, HDL, LDLCALC, TRIG, CHOLHDL, LDLDIRECT in the last 72 hours. Thyroid Function Tests: No results for  input(s): TSH, T4TOTAL, FREET4, T3FREE, THYROIDAB in the last 72 hours. Anemia Panel:  Recent Labs  04/12/17 0923  VITAMINB12 346  FOLATE 5.9*  FERRITIN 13*  TIBC 420  IRON 13*  RETICCTPCT 2.2   Sepsis Labs: No results for input(s): PROCALCITON, LATICACIDVEN in the last 168 hours.  No results found for this or any previous  visit (from the past 240 hour(s)).       Radiology Studies: Ct Head Wo Contrast  Result Date: 04/12/2017 CLINICAL DATA:  56 year old hypertensive male with visual loss right eye. No known injury. Initial encounter. EXAM: CT HEAD WITHOUT CONTRAST TECHNIQUE: Contiguous axial images were obtained from the base of the skull through the vertex without intravenous contrast. COMPARISON:  02/07/2017 CT. FINDINGS: Brain: No intracranial hemorrhage or CT evidence of large acute infarct. No intracranial mass lesion noted on this unenhanced exam. Partially empty non expanded sella. Vascular: Vascular calcifications. Skull: No acute abnormality. Sinuses/Orbits: No acute orbital abnormality. Visualized paranasal sinuses are clear. Other: Visualized mastoid air cells and middle ear cavities are clear. IMPRESSION: No acute intracranial abnormality detected. Electronically Signed   By: Lacy Duverney M.D.   On: 04/12/2017 13:19   Nm Bowel Img Meckels  Result Date: 04/12/2017 CLINICAL DATA:  Gastrointestinal bleeding question Meckel's diverticulum EXAM: NUCLEAR MEDICINE MECKELS SCAN TECHNIQUE: Sequential abdominal images were obtained following intravenous injection of radiopharmaceutical. RADIOPHARMACEUTICALS:  9 millicuries Tc-40m pertechnetate IV COMPARISON:  GI bleeding study 04/07/2017 FINDINGS: Normal uptake of tracer within gastric mucosa. Blood pool distribution of tracer is initially visualized. Physiologic excretion of tracer by the kidneys into the urinary bladder, with minimally prominent RIGHT renal pelvis incidentally noted. No abnormal abdominal localization of  pertechnetate is identified to suggest ectopic gastric mucosa and Meckel's diverticulum. IMPRESSION: Negative Meckel's scan. Electronically Signed   By: Ulyses Southward M.D.   On: 04/12/2017 12:16        Scheduled Meds: . atorvastatin  20 mg Oral Daily  . clonazePAM  0.5 mg Oral BID  . divalproex  750 mg Oral QHS  . fenofibrate  160 mg Oral Daily  . FLUoxetine  40 mg Oral BH-q7a  . mometasone-formoterol  2 puff Inhalation BID  . OLANZapine  5 mg Oral QHS  . pantoprazole  40 mg Oral QAC breakfast  . tiotropium  18 mcg Inhalation Daily   Continuous Infusions: . sodium chloride 50 mL/hr at 04/11/17 1459  . sodium chloride       LOS: 3 days    Time spent: 30 minutes    Katrinka Blazing, MD [facility] Pager [phone]  If 7PM-7AM, please contact night-coverage [email or web address] Password [email or web address] 04/12/2017, 2:22 PM

## 2017-04-12 NOTE — Progress Notes (Signed)
    Subjective: No bleeding since colonoscopy. No abdominal pain, N/V. NPO for scan today.   Objective: Vital signs in last 24 hours: Temp:  [97.8 F (36.6 C)-98.9 F (37.2 C)] 98 F (36.7 C) (05/21 0639) Pulse Rate:  [63-95] 75 (05/21 0639) Resp:  [8-27] 20 (05/21 0639) BP: (96-129)/(50-73) 127/50 (05/21 0639) SpO2:  [98 %-100 %] 98 % (05/21 0754) Weight:  [130 lb (59 kg)] 130 lb (59 kg) (05/20 1203) Last BM Date: 04/11/17 General:   Alert and oriented, pleasant Head:  Normocephalic and atraumatic. Abdomen:  Bowel sounds present, soft, non-tender, non-distended. No HSM or hernias noted. No rebound or guarding. No masses appreciated  Msk:  Symmetrical without gross deformities. Normal posture. Extremities:  Without edema. Neurologic:  Alert and  oriented x4 Psych:  Alert and cooperative. Normal mood and affect.  Intake/Output from previous day: 05/20 0701 - 05/21 0700 In: 1487.5 [I.V.:1152.5; Blood:335] Out: 700 [Urine:700] Intake/Output this shift: No intake/output data recorded.  Lab Results:  Recent Labs  04/11/17 0421 04/11/17 1539 04/12/17 0405  HGB 6.6* 7.9* 7.8*  HCT 21.2* 24.5* 23.9*   PT/INR  Recent Labs  04/10/17 0406  LABPROT 14.6  INR 1.14    Assessment: 56 year old male admitted with rectal bleeding and transfusion-dependent anemia. 2 units PRBCs this admission, last unit received 5/20. Thus far, colonoscopy X 2, EGD, capsule study completed. No old or fresh blood noted on colonoscopy yesterday. Meckel's scan ordered and to be completed today. Recommend surgical evaluation for hemorrhoidectomy due to large external and internal hemorrhoids and hematology referral to evaluate for bleeding disorder with Von Willebrand panel ordered and in process. Hgb stable this morning. No bleeding since colonoscopy.   Plan: Meckel's scan today Surgical evaluation Hematology evaluation Follow-up on pending Von Willebrand panel as it comes available   Gelene MinkAnna W.  Boone, PhD, ANP-BC [facility]    LOS: 3 days    04/12/2017, 8:02 AM

## 2017-04-12 NOTE — Consult Note (Signed)
[facility] Consultation Oncology  Name: Jeffrey [name]      MRN: [id number]    Location: A317/A317-01  Date: 04/12/2017 Time:3:08 PM   REFERRING PHYSICIAN:  Jonette Eva, MD (GI)  REASON FOR CONSULT:  Rectal bleeding, hemorrhoids.   DIAGNOSIS:  Hypochromic, microcytic anemia with preservation of WBC and platelet count.  HISTORY OF PRESENT ILLNESS:   56 yo man with past medical history significant for [name], depression, dyslipidemia, tobacco abuse, severe COPD, PUD, bipolar disorder who presented to [facility] ED on 04/07/2017 with rectal bleeding.    "The patient has chronic rectal bleeding, in the past this has been serious enough to require transfusion. Over the last few weeks, he has had bleeding from his rectum again. He describes this as "sitting on the toilet and blood drips out".  He had labs drawn by GI two days ago that showed a 2-pt drop in Hgb.  His bleeding persisted and today he talked to them and was recommended to go to the ER.  He has had no hematemesis, melena.  He takes baby aspirin, no other platelet agents or anticoagulants.  He had a colonoscopy 1 week ago that showed hemorrhoids, was being prepped for a capsule study."  Colonoscopy on 03/31/2017 by [name] demonstrates grade 2 internal hemorrhoids.  Heme Study on 04/08/2017 demonstrates mild to moderate small bowel/duodenal erosions, without active bleeding.  EGD by Dr. Darrick Penna on 02/09/2017 shows mild to moderate nonbleeding erosive gastropathy, mild duodenitis.  He presented to the emergency room with a hemoglobin in the 7 g/dL range.    He reports bright red blood with every BM x 2 years.  Rectal bleeding has been progressively worse.  He reports to infrequent bleeding without BM.  He denies any constipation.  He denies any gross hematuria.  He reports hemorrhoids.  He denies any hemoptysis and hematemesis.    Reports a poor diet.  He denies any EtOH use.  He denies CP and abdominal pain.   PAST MEDICAL HISTORY:   Past  Medical History:  Diagnosis Date  . Acute blood loss anemia 02/07/2017  . Anxiety   . Arthritis    deg disease, bulging disk,  shoulder level  . Bipolar disorder (HCC)   . Bipolar disorder (HCC)   . 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 Depression    anxiety  . Hyperlipidemia   . Hypertension   . Peptic ulcer disease    Review  . Pneumonia   . [name] (peripheral vascular disease) (HCC) 06/18/2015  . Shortness of breath     ALLERGIES: Allergies  Allergen Reactions  . Augmentin [Amoxicillin-Pot Clavulanate] Rash and Other (See Comments)    Has patient had a PCN reaction causing immediate rash, facial/tongue/throat swelling, SOB or lightheadedness with hypotension: No Has patient had a PCN reaction causing severe rash involving mucus membranes or skin necrosis: No Has patient had a PCN reaction that required hospitalization No Has patient had a PCN reaction occurring within the last 10 years: Yes If all of the above answers are "NO", then may proceed with Cephalosporin use.  . Ace Inhibitors Hives      MEDICATIONS: I have reviewed the patient's current medications.     PAST SURGICAL HISTORY Past Surgical History:  Procedure Laterality Date  . BIOPSY  02/09/2017   Procedure: BIOPSY;  Surgeon: [name], MD;  [facility];  Service: Endoscopy;;  duodenal gastric  . COLONOSCOPY  03/14/2007   WUJ:WJXBJY colonoscopy and terminal ileoscopy except external  hemorrhoids  . COLONOSCOPY N/A 05/05/2013   [name]: external/internal anal canal hemorrhoids, unable to intubate TI, segemental biopsies unremarkable   . COLONOSCOPY N/A 04/11/2017   Procedure: COLONOSCOPY;  Surgeon: [name], MD;  [facility];  Service: Endoscopy;  Laterality: N/A;  . COLONOSCOPY WITH PROPOFOL N/A 03/31/2017   Procedure: COLONOSCOPY WITH PROPOFOL;  Surgeon: [name], MD;  [facility];  Service: Endoscopy;  Laterality: N/A;  1:45pm  . ESOPHAGOGASTRODUODENOSCOPY   03/14/2007   [id number] antral gastritis with bulbar duodenitis/paucity to postbulbar duodenal folds and biopsy were benign with no evidence of villous atrophy.  . ESOPHAGOGASTRODUODENOSCOPY (EGD) WITH PROPOFOL N/A 02/09/2017   Procedure: ESOPHAGOGASTRODUODENOSCOPY (EGD) WITH PROPOFOL;  Surgeon: [name], MD;  [facility];  Service: Endoscopy;  Laterality: N/A;  . GIVENS CAPSULE STUDY N/A 04/08/2017   Procedure: GIVENS CAPSULE STUDY;  Surgeon: [name], MD;  Location[facility];  Service: Endoscopy;  Laterality: N/A;  . HAND SURGERY     left, secondary to self-inflicted laceration  . SHOULDER SURGERY     right  . TOE SURGERY     left great toe , amputated-lawnmover accident    FAMILY HISTORY: Family History  Problem Relation Age of Onset  . Breast cancer Mother        deceased  . Heart disease Father   . Depression Daughter   . Anxiety disorder Daughter   . Anxiety disorder Son   . Depression Son   . Asthma Brother   . Heart attack Maternal Aunt   . Heart attack Maternal Uncle   . Heart attack Paternal Aunt   . Heart attack Paternal Uncle   . Heart attack Maternal Grandmother   . Heart attack Maternal Grandfather   . Emphysema Maternal Grandfather   . Heart attack Paternal Grandmother   . Heart attack Paternal Grandfather   . Colon cancer Neg Hx   . Liver disease Neg Hx     SOCIAL HISTORY:  reports that he has been smoking Cigarettes.  He started smoking about 45 years ago. He has a 20.00 pack-year smoking history. He has never used smokeless tobacco. He reports that he uses drugs, including Marijuana. He reports that he does not drink alcohol.  PERFORMANCE STATUS: The patient's performance status is 1 - Symptomatic but completely ambulatory  PHYSICAL EXAM: Most Recent Vital Signs: Blood pressure (!) 113/58, pulse 62, temperature 98.5 F (36.9 C), temperature source Oral, resp. rate 19, height 5\' 10"  (1.778 m), weight 130 lb (59 kg), SpO2  100 %. BP (!) 113/58 (BP Location: Left Arm)   Pulse 62   Temp 98.5 F (36.9 C) (Oral)   Resp 19   Ht 5\' 10"  (1.778 m)   Wt 130 lb (59 kg)   SpO2 100%   BMI 18.65 kg/m   General Appearance:    Alert, cooperative, no distress, appears stated age, unaccompanied  Head:    Normocephalic, without obvious abnormality, atraumatic  Eyes:    Conjunctiva/corneas clear, EOMI normal       Ears:    Not examined  Nose:   Nares normal, septum midline, mucosa normal, no drainage    or sinus tenderness  Throat:   Lips, mucosa, and tongue normal.  Neck:   Supple, symmetrical, trachea midline, no adenopathy.  Back:     Symmetric, no curvature, ROM normal, no CVA tenderness  Lungs:     Clear to auscultation bilaterally, respirations unlabored  Chest  wall:    No tenderness or deformity  Heart:    Regular rate and rhythm, S1 and S2 normal, no murmur, rub   or gallop  Abdomen:     Soft, non-tender, bowel sounds active all four quadrants,    no masses, no organomegaly  Genitalia:    Not examined  Rectal:    Not examined  Extremities:   Extremities normal, atraumatic, no cyanosis or edema  Pulses:   2+ and symmetric all extremities  Skin:   Skin color, texture, turgor normal, no rashes or lesions  Lymph nodes:   Cervical, supraclavicular, and axillary nodes normal  Neurologic:   CNII-XII intact. Normal strength, sensation and reflexes      throughout    LABORATORY DATA:  Results for orders placed or performed during the hospital encounter of 04/07/17 (from the past 48 hour(s))  Hemoglobin and hematocrit, blood     Status: Abnormal   Collection Time: 04/10/17  5:25 PM  Result Value Ref Range   Hemoglobin 7.6 (L) 13.0 - 17.0 g/dL   HCT 16.123.7 (L) 09.639.0 - 04.552.0 %  Hemoglobin and hematocrit, blood     Status: Abnormal   Collection Time: 04/10/17  9:39 PM  Result Value Ref Range   Hemoglobin 7.2 (L) 13.0 - 17.0 g/dL   HCT 40.922.8 (L) 81.139.0 - 91.452.0 %  Hemoglobin and hematocrit, blood     Status: Abnormal    Collection Time: 04/11/17  4:21 AM  Result Value Ref Range   Hemoglobin 6.6 (LL) 13.0 - 17.0 g/dL    Comment: RESULT REPEATED AND VERIFIED CRITICAL RESULT CALLED TO, READ BACK BY AND VERIFIED WITH:  Eldor Conaway,C @ 0455 ON 04/11/17 BY JUW    HCT 21.2 (L) 39.0 - 52.0 %  Prepare RBC     Status: None   Collection Time: 04/11/17  7:39 AM  Result Value Ref Range   Order Confirmation ORDER PROCESSED BY BLOOD BANK   Type and screen [facility]     Status: None   Collection Time: 04/11/17  7:39 AM  Result Value Ref Range   ABO/RH(D) O POS    Antibody Screen NEG    Sample Expiration 04/14/2017    Unit Number [id number]    Blood Component Type RED CELLS,LR    Unit division 00    Status of Unit ISSUED,FINAL    Transfusion Status OK TO TRANSFUSE    Crossmatch Result Compatible   Hemoglobin and hematocrit, blood     Status: Abnormal   Collection Time: 04/11/17  3:39 PM  Result Value Ref Range   Hemoglobin 7.9 (L) 13.0 - 17.0 g/dL   HCT 78.424.5 (L) 69.639.0 - 29.552.0 %  Von Willebrand panel     Status: None   Collection Time: 04/11/17  3:39 PM  Result Value Ref Range   Coagulation Factor VIII 122 57 - 163 %   Ristocetin Co-factor, Plasma 109 50 - 200 %    Comment: (NOTE) Performed At: [facility] [address] [place], [place] [id number] Mila HomerHancock William F MD NU:[id number]:[phone]    Von Willebrand Antigen, Plasma 148 50 - 200 %    Comment: (NOTE) This test was developed and its performance characteristics determined by [facility]. It has not been cleared or approved by the Food and Drug Administration.   Coag Studies Interp Report     Status: None   Collection Time: 04/11/17  3:39 PM  Result Value Ref Range   Interpretation Note     Comment: (NOTE) -------------------------------  COAGULATION: VON WILLEBRAND FACTOR ASSESSMENT CURRENT RESULTS ASSESSMENT The VWF:Ag is normal. The VWF:RCo is normal. The FVIII is normal. VON WILLEBRAND FACTOR ASSESSMENT CURRENT  RESULTS INTERPRETATION - These results are not consistent with a diagnosis of VWD according to the current NHLBI guideline. VON WILLEBRAND FACTOR ASSESSMENT - Results may be falsely elevated and possibly falsely normal as VWF and FVIII may increase in samples drawn from patients (particularly children) who are visibly stressed at the time of phlebotomy, as acute phase reactants, or in response to certain drug therapies such as desmopressin. Repeat testing may be necessary before excluding a diagnosis of VWD especially if the clinical suspicion is high for an underlying bleeding disorder. The setting for phlebotomy should be as calm as possible and patients should be encouraged to sit quietly prior to the blood draw. VON WILLEBRAND FACTOR ASSESSMENT  DEFINITIONS - VWD - von Willebrand disease; VWF - von Willebrand factor; VWF:Ag - VWF antigen; VWF:RCo - VWF ristocetin cofactor activity; FVIII - factor VIII activity. - MEDICAL DIRECTOR: For questions regarding panel interpretation, please contact Lebron Conners, M.D. at [facility] at [phone]. ------------------------------- DISCLAIMER These assessments and interpretations are provided as a convenience in support of the physician-patient relationship and are not intended to replace the physician's clinical judgment. They are derived from national guidelines in addition to other evidence and expert opinion. The clinician should consider this information within the context of clinical opinion and the individual patient. SEE GUIDANCE FOR VON WILLEBRAND FACTOR ASSESSMENT: (1) The National Heart, Lung and Blood Institute. The Diagnosis, Evaluation and Management of von Willebrand Disease. Lafe Garin, MD: [employer] [phone]. 2007. Available at http://kemp.com/. (2) Annie Sable et al. Othella Boyer J Hematol. 2009; 84(6):366-370. (3) Laffan M et al. Haemophilia.  2004;10(3):199-217. (4) Pasi KJ et al. Haemophilia. 2004; 10(3):218-231. Performed At: [facility] [address] [place], [place] [id number] Doroteo Bradford MD OZ:[id number]   Hemoglobin and hematocrit, blood     Status: Abnormal   Collection Time: 04/12/17  4:05 AM  Result Value Ref Range   Hemoglobin 7.8 (L) 13.0 - 17.0 g/dL   HCT 25.9 (L) 56.3 - 87.5 %  Vitamin B12     Status: None   Collection Time: 04/12/17  9:23 AM  Result Value Ref Range   Vitamin B-12 346 180 - 914 pg/mL    Comment: (NOTE) This assay is not validated for testing neonatal or myeloproliferative syndrome specimens for Vitamin B12 levels. Performed at [facility], [address], [place], [place] [address]   Folate     Status: Abnormal   Collection Time: 04/12/17  9:23 AM  Result Value Ref Range   Folate 5.9 (L) >5.9 ng/mL    Comment: Performed at [facility], [address] [place], [place] [address]  Iron and TIBC     Status: Abnormal   Collection Time: 04/12/17  9:23 AM  Result Value Ref Range   Iron 13 (L) 45 - 182 ug/dL   TIBC 416 606 - 301 ug/dL   Saturation Ratios 3 (L) 17.9 - 39.5 %   UIBC 407 ug/dL    Comment: Performed at [facility], [address] [place], [place] [address]  Ferritin     Status: Abnormal   Collection Time: 04/12/17  9:23 AM  Result Value Ref Range   Ferritin 13 (L) 24 - 336 ng/mL    Comment: Performed at [facility], [address] [place],  [place] [address]  Reticulocytes     Status: Abnormal   Collection Time: 04/12/17  9:23 AM  Result Value Ref Range   Retic Ct Pct 2.2 0.4 - 3.1 %   RBC. 3.55 (L) 4.22 - 5.81 MIL/uL   Retic Count, Manual 78.1 19.0 - 186.0 K/uL      RADIOGRAPHY: Ct Head Wo Contrast  Result Date: 04/12/2017 CLINICAL DATA:  56 year old hypertensive male with visual loss right eye. No known injury. Initial encounter. EXAM: CT HEAD WITHOUT CONTRAST TECHNIQUE: Contiguous axial images were obtained  from the base of the skull through the vertex without intravenous contrast. COMPARISON:  02/07/2017 CT. FINDINGS: Brain: No intracranial hemorrhage or CT evidence of large acute infarct. No intracranial mass lesion noted on this unenhanced exam. Partially empty non expanded sella. Vascular: Vascular calcifications. Skull: No acute abnormality. Sinuses/Orbits: No acute orbital abnormality. Visualized paranasal sinuses are clear. Other: Visualized mastoid air cells and middle ear cavities are clear. IMPRESSION: No acute intracranial abnormality detected. Electronically Signed   By: Lacy Duverney M.D.   On: 04/12/2017 13:19   Nm Bowel Img Meckels  Result Date: 04/12/2017 CLINICAL DATA:  Gastrointestinal bleeding question Meckel's diverticulum EXAM: NUCLEAR MEDICINE MECKELS SCAN TECHNIQUE: Sequential abdominal images were obtained following intravenous injection of radiopharmaceutical. RADIOPHARMACEUTICALS:  9 millicuries Tc-7m pertechnetate IV COMPARISON:  GI bleeding study 04/07/2017 FINDINGS: Normal uptake of tracer within gastric mucosa. Blood pool distribution of tracer is initially visualized. Physiologic excretion of tracer by the kidneys into the urinary bladder, with minimally prominent RIGHT renal pelvis incidentally noted. No abnormal abdominal localization of pertechnetate is identified to suggest ectopic gastric mucosa and Meckel's diverticulum. IMPRESSION: Negative Meckel's scan. Electronically Signed   By: Ulyses Southward M.D.   On: 04/12/2017 12:16       PATHOLOGY:    Diagnosis 1. Duodenum, NOS biopsy - BENIGN SMALL BOWEL MUCOSA. - NO ACTIVE INFLAMMATION OR VILLOUS ATROPHY IDENTIFIED. 2. Stomach, biopsy - MILD CHRONIC INACTIVE GASTRITIS. - THERE IS NO EVIDENCE OF HELICOBACTER PYLORI, DYSPLASIA OR MALIGNANCY.  ASSESSMENT/PLAN:   Microcytic, hypochromic anemia secondary to iron deficiency anemia from GI blood loss.  S/P EGD/Colonoscopy/Camera capsule study.  Calculated iron deficit is ~ 780  mg.  Order placed for IV ferhame 510 mg.  Source of bleeding is presumed to be from chronic GI blood loss from hemorrhoidal bleeding- patient reports bright red blood with each BM and clots.  He denies any melena.   I have reviewed the risks, benefits, alternatives, and side effects of Feraheme including, but not limited to, reaction at injection site, anaphylaxis, back pain, hypophosphatemia, increase in blood pressure, flushing, hypotension, skin discoloration at injection site, nausea, vomiting.  Folate deficiency.  Anti-parietal cell and intrinsic factor antibodies ordered.  Homocysteine and methylmalonic acid ordered.  Orders placed for Folvite 1 mg PO daily.  B12 is WNL, but low-normal.  With the anticipated increased hematopoiesis with vitamin correction, will given 1 dose of IM B12 1000 mcg today.  PFA testing on 04/09/2017 is positive for platelet dysfunction most commonly seen in ASA ingestion.  Von Willebrand testing is ordered by GI and in process.  He will need outpatient follow-up at [facility].  At time of discharge, please call [facility] to establish ongoing follow-up.  Patient and plan discussed with Dr. Ralene Cork and she is in agreement with the aforementioned.   Dellis Anes, PA-C 04/12/2017 3:08 PM

## 2017-04-12 NOTE — Consult Note (Signed)
Aware of consult. Patient undergoing multiple procedures and is off the floor. We will see patient in the a.m. to leave for consult note.

## 2017-04-13 ENCOUNTER — Encounter (HOSPITAL_COMMUNITY): Payer: Self-pay

## 2017-04-13 ENCOUNTER — Ambulatory Visit (HOSPITAL_COMMUNITY): Admit: 2017-04-13 | Payer: Medicare Other | Admitting: Internal Medicine

## 2017-04-13 ENCOUNTER — Encounter (HOSPITAL_COMMUNITY): Payer: Medicare Other

## 2017-04-13 LAB — COMPREHENSIVE METABOLIC PANEL
ALT: 8 U/L — ABNORMAL LOW (ref 17–63)
AST: 18 U/L (ref 15–41)
Albumin: 3 g/dL — ABNORMAL LOW (ref 3.5–5.0)
Alkaline Phosphatase: 43 U/L (ref 38–126)
Anion gap: 6 (ref 5–15)
BUN: 5 mg/dL — ABNORMAL LOW (ref 6–20)
CO2: 26 mmol/L (ref 22–32)
Calcium: 8.8 mg/dL — ABNORMAL LOW (ref 8.9–10.3)
Chloride: 109 mmol/L (ref 101–111)
Creatinine, Ser: 1 mg/dL (ref 0.61–1.24)
GFR calc Af Amer: >60 mL/min (ref >60)
GFR calc non Af Amer: >60 mL/min (ref >60)
Glucose, Bld: 113 mg/dL — ABNORMAL HIGH (ref 65–99)
Potassium: 3.2 mmol/L — ABNORMAL LOW (ref 3.5–5.1)
Sodium: 141 mmol/L (ref 135–145)
Total Bilirubin: 0.6 mg/dL (ref 0.3–1.2)
Total Protein: 5.2 g/dL — ABNORMAL LOW (ref 6.5–8.1)

## 2017-04-13 LAB — CBC WITH DIFFERENTIAL/PLATELET
Basophils Absolute: 0 10*3/uL (ref 0.0–0.1)
Basophils Relative: 0 %
Eosinophils Absolute: 0.1 10*3/uL (ref 0.0–0.7)
Eosinophils Relative: 3 %
HCT: 24.3 % — ABNORMAL LOW (ref 39.0–52.0)
Hemoglobin: 7.5 g/dL — ABNORMAL LOW (ref 13.0–17.0)
Lymphocytes Relative: 22 %
Lymphs Abs: 1.1 10*3/uL (ref 0.7–4.0)
MCH: 23.4 pg — ABNORMAL LOW (ref 26.0–34.0)
MCHC: 30.9 g/dL (ref 30.0–36.0)
MCV: 75.9 fL — ABNORMAL LOW (ref 78.0–100.0)
Monocytes Absolute: 0.3 10*3/uL (ref 0.1–1.0)
Monocytes Relative: 7 %
Neutro Abs: 3.3 10*3/uL (ref 1.7–7.7)
Neutrophils Relative %: 68 %
Platelets: 285 10*3/uL (ref 150–400)
RBC: 3.2 MIL/uL — ABNORMAL LOW (ref 4.22–5.81)
RDW: 22.3 % — ABNORMAL HIGH (ref 11.5–15.5)
WBC: 4.8 10*3/uL (ref 4.0–10.5)

## 2017-04-13 LAB — HEMOGLOBIN AND HEMATOCRIT, BLOOD
HCT: 21.6 % — ABNORMAL LOW (ref 39.0–52.0)
HCT: 28.7 % — ABNORMAL LOW (ref 39.0–52.0)
Hemoglobin: 6.9 g/dL — CL (ref 13.0–17.0)
Hemoglobin: 9.5 g/dL — ABNORMAL LOW (ref 13.0–17.0)

## 2017-04-13 LAB — PREPARE RBC (CROSSMATCH)

## 2017-04-13 LAB — INTRINSIC FACTOR ANTIBODIES: Intrinsic Factor: 1 AU/mL (ref 0.0–1.1)

## 2017-04-13 LAB — HOMOCYSTEINE: Homocysteine: 16.8 umol/L — ABNORMAL HIGH (ref 0.0–15.0)

## 2017-04-13 SURGERY — IMAGING PROCEDURE, GI TRACT, INTRALUMINAL, VIA CAPSULE

## 2017-04-13 MED ORDER — CHLORHEXIDINE GLUCONATE CLOTH 2 % EX PADS
6.0000 | MEDICATED_PAD | Freq: Once | CUTANEOUS | Status: AC
  Administered 2017-04-13: 6 via TOPICAL

## 2017-04-13 MED ORDER — CHLORHEXIDINE GLUCONATE CLOTH 2 % EX PADS: 6.0000 | MEDICATED_PAD | Freq: Once | CUTANEOUS | Status: DC

## 2017-04-13 MED ORDER — POTASSIUM CHLORIDE CRYS ER 20 MEQ PO TBCR
20.0000 meq | EXTENDED_RELEASE_TABLET | Freq: Three times a day (TID) | ORAL | Status: DC
  Administered 2017-04-13 – 2017-04-15 (×7): 20 meq via ORAL
  Filled 2017-04-13 (×7): qty 1

## 2017-04-13 MED ORDER — SODIUM CHLORIDE 0.9 % IV SOLN
Freq: Once | INTRAVENOUS | Status: AC
  Administered 2017-04-13: 09:00:00 via INTRAVENOUS

## 2017-04-13 NOTE — Progress Notes (Signed)
Pt has temperature of 99.2. Pt has been premedicated with tylenol 650 mg. Pt's hemoglobin level 6.9. MD verbally informed. 1st unit of PRBC currently being transfused. Will continue to monitor patient.

## 2017-04-13 NOTE — Progress Notes (Signed)
PROGRESS NOTE    Jeffrey [name]  UJW:[id number] DOB: [birth year] DOA: 04/07/2017 PCP: Barbie BannerWilson, Fred H, MD    Brief Narrative:  Jeffrey [name] is a 56 y.o. male with a past medical history significant for COPD, Bipolar and chronic rectal bleeding who presents with rectal bleeding and abnormal labs.  The patient has chronic rectal bleeding, in the past this has been serious enough to require transfusion. Over the last few weeks, he has had bleeding from his rectum again. He describes this as "sitting on the toilet and blood drips out".  He had labs drawn by GI two days ago that showed a 2-pt drop in Hgb.  His bleeding persisted and today he talked to them and was recommended to go to the ER.  He has had no hematemesis, melena.  He takes baby aspirin, no other platelet agents or anticoagulants.  He had a colonoscopy 1 week ago that showed hemorrhoids, was being prepped for a capsule study.  In the ED patient was found to be Afebrile, heart rate 71, respirations and pulse ox normal, blood pressure 121/74. Na 135, K 4.0, Cr 1.01, WBC 6.7K, Hgb 8.9 stable from 2 days ago. The case was discussed with gastroenterology, who recommended a tagged red cell scan and TRH were asked to admit  Patient continued to have rectal bleeding and his H/H continued to drop.  He underwent tagged red blood cell scan as well as capsule study both of which did not show cause of bleeding.  Gastroenterology ordered PT/INR both of which were normal and ordered platelet assay which showed abnormalities normally seen in aspirin.  Hematology consulted.  Patient underwent a Meckel's scan secondary to continued GI bleeding.  General Surgery consulted.  Plan for hemorrhoidectomy on 5/22.   Assessment & Plan:   Principal Problem:   Acute blood loss anemia Active Problems:   Hematochezia   Tobacco use disorder   COPD, severe (HCC)   GIB (gastrointestinal bleeding)   Bipolar disorder (HCC)   Rectal bleeding   Acute blood loss  anemia from rectal bleeding:  - workup thus far negative - patient H/H continues to decline after blood transfusions - transfusing 2 units today - hemorrhoidectomy tomorrow - General Surgery, Gastroenterology, Hematology consulted - follow serial H/H  Bipolar disorder:  - Continue Depakote, Zyprexa - Continue Prozac - Continue Klonopin  COPD:  - Continue Dulera - Continue Spiriva - Albuterol PRN  Vision loss: - CT of head negative - negative MRI/ MRA - cannot give aspirin due to GIB   DVT prophylaxis: SCDs (cannot use lovenox or heparin given GI bleed) Code Status: FULL  Family Communication: no family bedside Disposition Plan: likely home after workup complete    Consultants:   GI  Gen Surg  Hematology  Procedures:   Tagged red blood scan  Capsule study  Flexible sigmoidoscopy  Meckel's scan  Antimicrobials:   none    Subjective: Patient had two large bloody bowel movements today.  Voices that he felt better prior to these bowel movements.  Objective: Vitals:   04/13/17 0807 04/13/17 0810 04/13/17 1350 04/13/17 1409  BP:   (!) 114/54 (!) 105/50  Pulse:   85 68  Resp:   18 18  Temp:   99.2 F (37.3 C) 98.6 F (37 C)  TempSrc:   Oral Oral  SpO2: 98% 98% 100% 100%  Weight:      Height:        Intake/Output Summary (Last 24 hours) at 04/13/17 1423  Last data filed at 04/13/17 1354  Gross per 24 hour  Intake             1010 ml  Output             1300 ml  Net             -290 ml   Filed Weights   04/07/17 1426 04/07/17 2316 04/11/17 1203  Weight: 68.5 kg (151 lb) 62.3 kg (137 lb 4.8 oz) 59 kg (130 lb)    Examination:  General exam: less pale today, sleeping initially, Respiratory system: Clear to auscultation. Respiratory effort normal. Cardiovascular system:S1 &S2 heard, RRR. No JVD, murmurs, rubs, gallops or clicks. No pedal edema. Gastrointestinal system:Abdomen is nondistended, soft and nontender. No organomegaly or masses  felt. Normal bowel sounds heard. Central nervous system:Alert and oriented. No focal neurological deficits. Visual fields normal and intact bilaterally Extremities: Symmetric 5 x 5 power. Skin: No rashes, lesions or ulcers Psychiatry:Mood &affect appropriate.  Good insight, reliable historian  Data Reviewed: I have personally reviewed following labs and imaging studies  CBC:  Recent Labs Lab 04/07/17 1434 04/08/17 0553  04/11/17 1539 04/12/17 0405 04/12/17 1549 04/13/17 0423 04/13/17 1307  WBC 6.7 4.2  --   --   --   --  4.8  --   NEUTROABS 4.6  --   --   --   --   --  3.3  --   HGB 8.9* 7.3*  < > 7.9* 7.8* 8.3* 7.5* 6.9*  HCT 28.1* 24.2*  < > 24.5* 23.9* 25.6* 24.3* 21.6*  MCV 69.2* 70.3*  --   --   --   --  75.9*  --   PLT 381 331  --   --   --   --  285  --   < > = values in this interval not displayed. Basic Metabolic Panel:  Recent Labs Lab 04/07/17 1434 04/13/17 0423  NA 135 141  K 4.0 3.2*  CL 102 109  CO2 26 26  GLUCOSE 98 113*  BUN 11 5*  CREATININE 1.01 1.00  CALCIUM 9.1 8.8*   GFR: Estimated Creatinine Clearance: 68.8 mL/min (by C-G formula based on SCr of 1 mg/dL). Liver Function Tests:  Recent Labs Lab 04/07/17 1434 04/13/17 0423  AST 22 18  ALT 11* 8*  ALKPHOS 58 43  BILITOT 0.2* 0.6  PROT 6.4* 5.2*  ALBUMIN 3.6 3.0*   No results for input(s): LIPASE, AMYLASE in the last 168 hours. No results for input(s): AMMONIA in the last 168 hours. Coagulation Profile:  Recent Labs Lab 04/10/17 0406  INR 1.14   Cardiac Enzymes: No results for input(s): CKTOTAL, CKMB, CKMBINDEX, TROPONINI in the last 168 hours. BNP (last 3 results) No results for input(s): PROBNP in the last 8760 hours. HbA1C: No results for input(s): HGBA1C in the last 72 hours. CBG: No results for input(s): GLUCAP in the last 168 hours. Lipid Profile: No results for input(s): CHOL, HDL, LDLCALC, TRIG, CHOLHDL, LDLDIRECT in the last 72 hours. Thyroid Function Tests: No  results for input(s): TSH, T4TOTAL, FREET4, T3FREE, THYROIDAB in the last 72 hours. Anemia Panel:  Recent Labs  04/12/17 0923  VITAMINB12 346  FOLATE 5.9*  FERRITIN 13*  TIBC 420  IRON 13*  RETICCTPCT 2.2   Sepsis Labs: No results for input(s): PROCALCITON, LATICACIDVEN in the last 168 hours.  No results found for this or any previous visit (from the past 240 hour(s)).  Radiology Studies: Ct Head Wo Contrast  Result Date: 04/12/2017 CLINICAL DATA:  56 year old hypertensive male with visual loss right eye. No known injury. Initial encounter. EXAM: CT HEAD WITHOUT CONTRAST TECHNIQUE: Contiguous axial images were obtained from the base of the skull through the vertex without intravenous contrast. COMPARISON:  02/07/2017 CT. FINDINGS: Brain: No intracranial hemorrhage or CT evidence of large acute infarct. No intracranial mass lesion noted on this unenhanced exam. Partially empty non expanded sella. Vascular: Vascular calcifications. Skull: No acute abnormality. Sinuses/Orbits: No acute orbital abnormality. Visualized paranasal sinuses are clear. Other: Visualized mastoid air cells and middle ear cavities are clear. IMPRESSION: No acute intracranial abnormality detected. Electronically Signed   By: Lacy Duverney M.D.   On: 04/12/2017 13:19   Mr Maxine Glenn Head Wo Contrast  Result Date: 04/12/2017 CLINICAL DATA:  RIGHT vision loss for week. Weakness. History of hypertension, smoker, hyperlipidemia. EXAM: MRI HEAD WITHOUT CONTRAST MRA HEAD WITHOUT CONTRAST TECHNIQUE: Multiplanar, multiecho pulse sequences of the brain and surrounding structures were obtained without intravenous contrast. Angiographic images of the head were obtained using MRA technique without contrast. COMPARISON:  CT HEAD Apr 12, 2017 at 1301 hours FINDINGS: MRI HEAD FINDINGS BRAIN: No reduced diffusion to suggest acute ischemia nor hyperacute demyelination. No susceptibility artifact to suggest hemorrhage. The ventricles and  sulci are normal for patient's age. No suspicious parenchymal signal, masses or mass effect. No abnormal extra-axial fluid collections. VASCULAR: Normal major intracranial vascular flow voids present at skull base. SKULL AND UPPER CERVICAL SPINE: No abnormal sellar expansion. No suspicious calvarial bone marrow signal. Craniocervical junction maintained. SINUSES/ORBITS: Trace paranasal sinus mucosal thickening without air-fluid levels. Mastoid air cells are well aerated. The included ocular globes and orbital contents are non-suspicious. OTHER: Patient is edentulous. MRA HEAD FINDINGS ANTERIOR CIRCULATION: Normal flow related enhancement of the included cervical, petrous, cavernous and supraclinoid internal carotid arteries. Patent anterior communicating artery. Normal flow related enhancement of the anterior and middle cerebral arteries, including distal segments. No large vessel occlusion, high-grade stenosis, abnormal luminal irregularity, aneurysm. POSTERIOR CIRCULATION: Codominant vertebral artery's. Basilar artery is patent, with normal flow related enhancement of the main branch vessels. Normal flow related enhancement of the posterior cerebral arteries. No large vessel occlusion, high-grade stenosis, abnormal luminal irregularity, aneurysm. ANATOMIC VARIANTS: None. Source images and MIP images were reviewed. IMPRESSION: Negative noncontrast MRI head. Negative noncontrast MRA head. Electronically Signed   By: Awilda Metro M.D.   On: 04/12/2017 16:59   Mr Brain Wo Contrast  Result Date: 04/12/2017 CLINICAL DATA:  RIGHT vision loss for week. Weakness. History of hypertension, smoker, hyperlipidemia. EXAM: MRI HEAD WITHOUT CONTRAST MRA HEAD WITHOUT CONTRAST TECHNIQUE: Multiplanar, multiecho pulse sequences of the brain and surrounding structures were obtained without intravenous contrast. Angiographic images of the head were obtained using MRA technique without contrast. COMPARISON:  CT HEAD Apr 12, 2017 at 1301 hours FINDINGS: MRI HEAD FINDINGS BRAIN: No reduced diffusion to suggest acute ischemia nor hyperacute demyelination. No susceptibility artifact to suggest hemorrhage. The ventricles and sulci are normal for patient's age. No suspicious parenchymal signal, masses or mass effect. No abnormal extra-axial fluid collections. VASCULAR: Normal major intracranial vascular flow voids present at skull base. SKULL AND UPPER CERVICAL SPINE: No abnormal sellar expansion. No suspicious calvarial bone marrow signal. Craniocervical junction maintained. SINUSES/ORBITS: Trace paranasal sinus mucosal thickening without air-fluid levels. Mastoid air cells are well aerated. The included ocular globes and orbital contents are non-suspicious. OTHER: Patient is edentulous. MRA HEAD FINDINGS ANTERIOR CIRCULATION: Normal flow related enhancement  of the included cervical, petrous, cavernous and supraclinoid internal carotid arteries. Patent anterior communicating artery. Normal flow related enhancement of the anterior and middle cerebral arteries, including distal segments. No large vessel occlusion, high-grade stenosis, abnormal luminal irregularity, aneurysm. POSTERIOR CIRCULATION: Codominant vertebral artery's. Basilar artery is patent, with normal flow related enhancement of the main branch vessels. Normal flow related enhancement of the posterior cerebral arteries. No large vessel occlusion, high-grade stenosis, abnormal luminal irregularity, aneurysm. ANATOMIC VARIANTS: None. Source images and MIP images were reviewed. IMPRESSION: Negative noncontrast MRI head. Negative noncontrast MRA head. Electronically Signed   By: Awilda Metro M.D.   On: 04/12/2017 16:59   Nm Bowel Img Meckels  Result Date: 04/12/2017 CLINICAL DATA:  Gastrointestinal bleeding question Meckel's diverticulum EXAM: NUCLEAR MEDICINE MECKELS SCAN TECHNIQUE: Sequential abdominal images were obtained following intravenous injection of  radiopharmaceutical. RADIOPHARMACEUTICALS:  9 millicuries Tc-35m pertechnetate IV COMPARISON:  GI bleeding study 04/07/2017 FINDINGS: Normal uptake of tracer within gastric mucosa. Blood pool distribution of tracer is initially visualized. Physiologic excretion of tracer by the kidneys into the urinary bladder, with minimally prominent RIGHT renal pelvis incidentally noted. No abnormal abdominal localization of pertechnetate is identified to suggest ectopic gastric mucosa and Meckel's diverticulum. IMPRESSION: Negative Meckel's scan. Electronically Signed   By: Ulyses Southward M.D.   On: 04/12/2017 12:16        Scheduled Meds: . atorvastatin  20 mg Oral Daily  . clonazePAM  0.5 mg Oral BID  . divalproex  750 mg Oral QHS  . fenofibrate  160 mg Oral Daily  . FLUoxetine  40 mg Oral [id number]  . folic acid  1 mg Oral Daily  . mometasone-formoterol  2 puff Inhalation BID  . OLANZapine  5 mg Oral QHS  . pantoprazole  40 mg Oral QAC breakfast  . potassium chloride  20 mEq Oral TID  . tiotropium  18 mcg Inhalation Daily   Continuous Infusions: . sodium chloride 50 mL/hr at 04/11/17 1459  . sodium chloride    . sodium chloride       LOS: 4 days    Time spent: 30 minutes    Katrinka Blazing, MD [facility] Pager [phone]  If 7PM-7AM, please contact night-coverage [email or web address] Password [facility] 04/13/2017, 2:23 PM

## 2017-04-13 NOTE — Consult Note (Signed)
Reason for Consult: Rectal bleeding Referring Physician: Dr. Laure [name] is an 56 y.o. male.  HPI: Patient is a 56 year old white male multiple medical problems who has had anemia secondary to acute on chronic blood loss. He has had an extensive workup for source of his GI bleeding, and the only thing that has been found on examination are hemorrhoids. He states he has had intermittent bleeding for some time now. He denies any pain at the present time. He has had some bowel movements recently which have been without blood. He has received multiple blood transfusions.  Past Medical History:  Diagnosis Date  . Acute blood loss anemia 02/07/2017  . Anxiety   . Arthritis    deg disease, bulging disk,  shoulder level  . Bipolar disorder ([place])   . Bipolar disorder ([place])   . 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 Depression    anxiety  . Hyperlipidemia   . Hypertension   . Peptic ulcer disease    Review  . Pneumonia   . [name] (peripheral vascular disease) ([place]) 06/18/2015  . Shortness of breath     Past Surgical History:  Procedure Laterality Date  . BIOPSY  02/09/2017   Procedure: BIOPSY;  Surgeon: Danie Binder, MD;  [facility];  Service: Endoscopy;;  duodenal gastric  . COLONOSCOPY  03/14/2007   [id number]:[id number] colonoscopy and terminal ileoscopy except external hemorrhoids  . COLONOSCOPY N/A 05/05/2013   Dr. Gala Romney: external/internal anal canal hemorrhoids, unable to intubate TI, segemental biopsies unremarkable   . COLONOSCOPY N/A 04/11/2017   Procedure: COLONOSCOPY;  Surgeon: Danie Binder, MD;  [facility];  Service: Endoscopy;  Laterality: N/A;  . COLONOSCOPY WITH PROPOFOL N/A 03/31/2017   Procedure: COLONOSCOPY WITH PROPOFOL;  Surgeon: Jeffrey Dolin, MD;  [facility];  Service: Endoscopy;  Laterality: N/A;  1:45pm  . ESOPHAGOGASTRODUODENOSCOPY  03/14/2007   [id number] antral gastritis with bulbar duodenitis/paucity to postbulbar duodenal  folds and biopsy were benign with no evidence of villous atrophy.  . ESOPHAGOGASTRODUODENOSCOPY (EGD) WITH PROPOFOL N/A 02/09/2017   Procedure: ESOPHAGOGASTRODUODENOSCOPY (EGD) WITH PROPOFOL;  Surgeon: Danie Binder, MD;  [facility];  Service: Endoscopy;  Laterality: N/A;  . GIVENS CAPSULE STUDY N/A 04/08/2017   Procedure: GIVENS CAPSULE STUDY;  Surgeon: Jeffrey Dolin, MD;  [facility];  Service: Endoscopy;  Laterality: N/A;  . HAND SURGERY     left, secondary to self-inflicted laceration  . SHOULDER SURGERY     right  . TOE SURGERY     left great toe , amputated-lawnmover accident    Family History  Problem Relation Age of Onset  . Breast cancer Mother        deceased  . Heart disease Father   . Depression Daughter   . Anxiety disorder Daughter   . Anxiety disorder Son   . Depression Son   . Asthma Brother   . Heart attack Maternal Aunt   . Heart attack Maternal Uncle   . Heart attack Paternal Aunt   . Heart attack Paternal Uncle   . Heart attack Maternal Grandmother   . Heart attack Maternal Grandfather   . Emphysema Maternal Grandfather   . Heart attack Paternal Grandmother   . Heart attack Paternal Grandfather   . Colon cancer Neg Hx   . Liver disease Neg Hx     Social History:  reports that he has been smoking Cigarettes.  He started smoking about 45  years ago. He has a 20.00 pack-year smoking history. He has never used smokeless tobacco. He reports that he uses drugs, including Marijuana. He reports that he does not drink alcohol.  Allergies:  Allergies  Allergen Reactions  . Augmentin [Amoxicillin-Pot Clavulanate] Rash and Other (See Comments)    Has patient had a PCN reaction causing immediate rash, facial/tongue/throat swelling, SOB or lightheadedness with hypotension: No Has patient had a PCN reaction causing severe rash involving mucus membranes or skin necrosis: No Has patient had a PCN reaction that required hospitalization No Has  patient had a PCN reaction occurring within the last 10 years: Yes If all of the above answers are "NO", then may proceed with Cephalosporin use.  . Ace Inhibitors Hives    Medications:  Scheduled: . atorvastatin  20 mg Oral Daily  . clonazePAM  0.5 mg Oral BID  . divalproex  750 mg Oral QHS  . fenofibrate  160 mg Oral Daily  . FLUoxetine  40 mg Oral BH-q7a  . folic acid  1 mg Oral Daily  . mometasone-formoterol  2 puff Inhalation BID  . OLANZapine  5 mg Oral QHS  . pantoprazole  40 mg Oral QAC breakfast  . potassium chloride  20 mEq Oral TID  . tiotropium  18 mcg Inhalation Daily    Results for orders placed or performed during the hospital encounter of 04/07/17 (from the past 48 hour(s))  Hemoglobin and hematocrit, blood     Status: Abnormal   Collection Time: 04/11/17  3:39 PM  Result Value Ref Range   Hemoglobin 7.9 (L) 13.0 - 17.0 g/dL   HCT 24.5 (L) 39.0 - 52.0 %  Von Willebrand panel     Status: None   Collection Time: 04/11/17  3:39 PM  Result Value Ref Range   Coagulation Factor VIII 122 57 - 163 %   Ristocetin Co-factor, Plasma 109 50 - 200 %    Comment: (NOTE) Performed At: [facility] [place], [place] [id number] Lindon Romp MD WU:[id number]    Von Willebrand Antigen, Plasma 148 50 - 200 %    Comment: (NOTE) This test was developed and its performance characteristics determined by [facility]. It has not been cleared or approved by the Food and Drug Administration.   Coag Studies Interp Report     Status: None   Collection Time: 04/11/17  3:39 PM  Result Value Ref Range   Interpretation Note     Comment: (NOTE) ------------------------------- COAGULATION: VON WILLEBRAND FACTOR ASSESSMENT CURRENT RESULTS ASSESSMENT The VWF:Ag is normal. The VWF:RCo is normal. The FVIII is normal. VON WILLEBRAND FACTOR ASSESSMENT CURRENT RESULTS INTERPRETATION - These results are not consistent with a diagnosis of VWD according to the current  NHLBI guideline. VON WILLEBRAND FACTOR ASSESSMENT - Results may be falsely elevated and possibly falsely normal as VWF and FVIII may increase in samples drawn from patients (particularly children) who are visibly stressed at the time of phlebotomy, as acute phase reactants, or in response to certain drug therapies such as desmopressin. Repeat testing may be necessary before excluding a diagnosis of VWD especially if the clinical suspicion is high for an underlying bleeding disorder. The setting for phlebotomy should be as calm as possible and patients should be encouraged to sit quietly prior to the blood draw. VON WILLEBRAND FACTOR ASSESSMENT  DEFINITIONS - VWD - von Willebrand disease; VWF - von Willebrand factor; VWF:Ag - VWF antigen; VWF:RCo - VWF ristocetin cofactor activity; FVIII - factor VIII activity. -  MEDICAL DIRECTOR: For questions regarding panel interpretation, please contact Jake Bathe, M.D. at [facility] at [phone]. ------------------------------- DISCLAIMER These assessments and interpretations are provided as a convenience in support of the physician-patient relationship and are not intended to replace the physician's clinical judgment. They are derived from national guidelines in addition to other evidence and expert opinion. The clinician should consider this information within the context of clinical opinion and the individual patient. SEE GUIDANCE FOR VON WILLEBRAND FACTOR ASSESSMENT: (1) The National Heart, Lung and Blood Institute. The Diagnosis, Evaluation and Management of von Willebrand Disease. Janeal Holmes, MD: [place] n [phone]. Available at vSpecials.com.pt. (2) Daryl Eastern et al. [place] J Hematol. 2009; 84(6):366-370. (3) [place]. 2004;10(3):199-217. (4) Pasi KJ et al. Haemophilia. 2004; 10(3):218-231. Performed At: [facility] [place], [place] [id number] Thomasene Ripple MD BH:[id number]   Hemoglobin and hematocrit, blood     Status: Abnormal   Collection Time: 04/12/17  4:05 AM  Result Value Ref Range   Hemoglobin 7.8 (L) 13.0 - 17.0 g/dL   HCT 23.9 (L) 39.0 - 52.0 %  Vitamin B12     Status: None   Collection Time: 04/12/17  9:23 AM  Result Value Ref Range   Vitamin B-12 346 180 - 914 pg/mL    Comment: (NOTE) This assay is not validated for testing neonatal or myeloproliferative syndrome specimens for Vitamin B12 levels. Performed at [facility], [place] [address] [place], [place] [address]   Folate     Status: Abnormal   Collection Time: 04/12/17  9:23 AM  Result Value Ref Range   Folate 5.9 (L) >5.9 ng/mL    Comment: Performed at [facility], [place] [address], [place], [place] [address]  Iron and TIBC     Status: Abnormal   Collection Time: 04/12/17  9:23 AM  Result Value Ref Range   Iron 13 (L) 45 - 182 ug/dL   TIBC 420 250 - 450 ug/dL   Saturation Ratios 3 (L) 17.9 - 39.5 %   UIBC 407 ug/dL    Comment: Performed at [facility], [place] [address], [place], [place] [address]  Ferritin     Status: Abnormal   Collection Time: 04/12/17  9:23 AM  Result Value Ref Range   Ferritin 13 (L) 24 - 336 ng/mL    Comment: Performed at [facility], [place] [address] [place], [place] [address]  Reticulocytes     Status: Abnormal   Collection Time: 04/12/17  9:23 AM  Result Value Ref Range   Retic Ct Pct 2.2 0.4 - 3.1 %   RBC. 3.55 (L) 4.22 - 5.81 MIL/uL   Retic Count, Manual 78.1 19.0 - 186.0 K/uL  Hemoglobin and hematocrit, blood     Status: Abnormal   Collection Time: 04/12/17  3:49 PM  Result Value Ref Range   Hemoglobin 8.3 (L) 13.0 - 17.0 g/dL   HCT 25.6 (L) 39.0 - 52.0 %  CBC with Differential/Platelet     Status: Abnormal   Collection Time: 04/13/17  4:23 AM  Result Value Ref Range   WBC 4.8 4.0 - 10.5 K/uL   RBC 3.20 (L) 4.22 - 5.81 MIL/uL    Hemoglobin 7.5 (L) 13.0 - 17.0 g/dL   HCT 24.3 (L) 39.0 - 52.0 %   MCV 75.9 (L) 78.0 - 100.0 fL   MCH 23.4 (L) 26.0 - 34.0 pg   MCHC  30.9 30.0 - 36.0 g/dL   RDW 22.3 (H) 11.5 - 15.5 %   Platelets 285 150 - 400 K/uL   Neutrophils Relative % 68 %   Lymphocytes Relative 22 %   Monocytes Relative 7 %   Eosinophils Relative 3 %   Basophils Relative 0 %   Neutro Abs 3.3 1.7 - 7.7 K/uL   Lymphs Abs 1.1 0.7 - 4.0 K/uL   Monocytes Absolute 0.3 0.1 - 1.0 K/uL   Eosinophils Absolute 0.1 0.0 - 0.7 K/uL   Basophils Absolute 0.0 0.0 - 0.1 K/uL  Comprehensive metabolic panel     Status: Abnormal   Collection Time: 04/13/17  4:23 AM  Result Value Ref Range   Sodium 141 135 - 145 mmol/L   Potassium 3.2 (L) 3.5 - 5.1 mmol/L   Chloride 109 101 - 111 mmol/L   CO2 26 22 - 32 mmol/L   Glucose, Bld 113 (H) 65 - 99 mg/dL   BUN 5 (L) 6 - 20 mg/dL   Creatinine, Ser 1.00 0.61 - 1.24 mg/dL   Calcium 8.8 (L) 8.9 - 10.3 mg/dL   Total Protein 5.2 (L) 6.5 - 8.1 g/dL   Albumin 3.0 (L) 3.5 - 5.0 g/dL   AST 18 15 - 41 U/L   ALT 8 (L) 17 - 63 U/L   Alkaline Phosphatase 43 38 - 126 U/L   Total Bilirubin 0.6 0.3 - 1.2 mg/dL   GFR calc non Af Amer >60 >60 mL/min   GFR calc Af Amer >60 >60 mL/min    Comment: (NOTE) The eGFR has been calculated using the CKD EPI equation. This calculation has not been validated in all clinical situations. eGFR's persistently <60 mL/min signify possible Chronic [name] Disease.    Anion gap 6 5 - 15    Ct Head Wo Contrast  Result Date: 04/12/2017 CLINICAL DATA:  56 year old hypertensive male with visual loss right eye. No known injury. Initial encounter. EXAM: CT HEAD WITHOUT CONTRAST TECHNIQUE: Contiguous axial images were obtained from the base of the skull through the vertex without intravenous contrast. COMPARISON:  02/07/2017 CT. FINDINGS: Brain: No intracranial hemorrhage or CT evidence of large acute infarct. No intracranial mass lesion noted on this unenhanced exam.  Partially empty non expanded sella. Vascular: Vascular calcifications. Skull: No acute abnormality. Sinuses/Orbits: No acute orbital abnormality. Visualized paranasal sinuses are clear. Other: Visualized mastoid air cells and middle ear cavities are clear. IMPRESSION: No acute intracranial abnormality detected. Electronically Signed   By: Genia Del M.D.   On: 04/12/2017 13:19   Mr Jodene Nam Head Wo Contrast  Result Date: 04/12/2017 CLINICAL DATA:  RIGHT vision loss for week. Weakness. History of hypertension, smoker, hyperlipidemia. EXAM: MRI HEAD WITHOUT CONTRAST MRA HEAD WITHOUT CONTRAST TECHNIQUE: Multiplanar, multiecho pulse sequences of the brain and surrounding structures were obtained without intravenous contrast. Angiographic images of the head were obtained using MRA technique without contrast. COMPARISON:  CT HEAD Apr 12, 2017 at 1301 hours FINDINGS: MRI HEAD FINDINGS BRAIN: No reduced diffusion to suggest acute ischemia nor hyperacute demyelination. No susceptibility artifact to suggest hemorrhage. The ventricles and sulci are normal for patient's age. No suspicious parenchymal signal, masses or mass effect. No abnormal extra-axial fluid collections. VASCULAR: Normal major intracranial vascular flow voids present at skull base. SKULL AND UPPER CERVICAL SPINE: No abnormal sellar expansion. No suspicious calvarial bone marrow signal. Craniocervical junction maintained. SINUSES/ORBITS: Trace paranasal sinus mucosal thickening without air-fluid levels. Mastoid air cells are well aerated. The included ocular globes and  orbital contents are non-suspicious. OTHER: Patient is edentulous. MRA HEAD FINDINGS ANTERIOR CIRCULATION: Normal flow related enhancement of the included cervical, petrous, cavernous and supraclinoid internal carotid arteries. Patent anterior communicating artery. Normal flow related enhancement of the anterior and middle cerebral arteries, including distal segments. No large vessel  occlusion, high-grade stenosis, abnormal luminal irregularity, aneurysm. POSTERIOR CIRCULATION: Codominant vertebral artery's. Basilar artery is patent, with normal flow related enhancement of the main branch vessels. Normal flow related enhancement of the posterior cerebral arteries. No large vessel occlusion, high-grade stenosis, abnormal luminal irregularity, aneurysm. ANATOMIC VARIANTS: None. Source images and MIP images were reviewed. IMPRESSION: Negative noncontrast MRI head. Negative noncontrast MRA head. Electronically Signed   By: Elon Alas M.D.   On: 04/12/2017 16:59   Mr Brain Wo Contrast  Result Date: 04/12/2017 CLINICAL DATA:  RIGHT vision loss for week. Weakness. History of hypertension, smoker, hyperlipidemia. EXAM: MRI HEAD WITHOUT CONTRAST MRA HEAD WITHOUT CONTRAST TECHNIQUE: Multiplanar, multiecho pulse sequences of the brain and surrounding structures were obtained without intravenous contrast. Angiographic images of the head were obtained using MRA technique without contrast. COMPARISON:  CT HEAD Apr 12, 2017 at 1301 hours FINDINGS: MRI HEAD FINDINGS BRAIN: No reduced diffusion to suggest acute ischemia nor hyperacute demyelination. No susceptibility artifact to suggest hemorrhage. The ventricles and sulci are normal for patient's age. No suspicious parenchymal signal, masses or mass effect. No abnormal extra-axial fluid collections. VASCULAR: Normal major intracranial vascular flow voids present at skull base. SKULL AND UPPER CERVICAL SPINE: No abnormal sellar expansion. No suspicious calvarial bone marrow signal. Craniocervical junction maintained. SINUSES/ORBITS: Trace paranasal sinus mucosal thickening without air-fluid levels. Mastoid air cells are well aerated. The included ocular globes and orbital contents are non-suspicious. OTHER: Patient is edentulous. MRA HEAD FINDINGS ANTERIOR CIRCULATION: Normal flow related enhancement of the included cervical, petrous, cavernous and  supraclinoid internal carotid arteries. Patent anterior communicating artery. Normal flow related enhancement of the anterior and middle cerebral arteries, including distal segments. No large vessel occlusion, high-grade stenosis, abnormal luminal irregularity, aneurysm. POSTERIOR CIRCULATION: Codominant vertebral artery's. Basilar artery is patent, with normal flow related enhancement of the main branch vessels. Normal flow related enhancement of the posterior cerebral arteries. No large vessel occlusion, high-grade stenosis, abnormal luminal irregularity, aneurysm. ANATOMIC VARIANTS: None. Source images and MIP images were reviewed. IMPRESSION: Negative noncontrast MRI head. Negative noncontrast MRA head. Electronically Signed   By: Elon Alas M.D.   On: 04/12/2017 16:59   Nm Bowel Img Meckels  Result Date: 04/12/2017 CLINICAL DATA:  Gastrointestinal bleeding question Meckel's diverticulum EXAM: NUCLEAR MEDICINE MECKELS SCAN TECHNIQUE: Sequential abdominal images were obtained following intravenous injection of radiopharmaceutical. RADIOPHARMACEUTICALS:  9 millicuries [id number] pertechnetate IV COMPARISON:  GI bleeding study 04/07/2017 FINDINGS: Normal uptake of tracer within gastric mucosa. Blood pool distribution of tracer is initially visualized. Physiologic excretion of tracer by the kidneys into the urinary bladder, with minimally prominent RIGHT renal pelvis incidentally noted. No abnormal abdominal localization of pertechnetate is identified to suggest ectopic gastric mucosa and Meckel's diverticulum. IMPRESSION: Negative Meckel's scan. Electronically Signed   By: Lavonia Dana M.D.   On: 04/12/2017 12:16    ROS:  Pertinent items noted in HPI and remainder of comprehensive ROS otherwise negative.  Blood pressure (!) 113/46, pulse 73, temperature 97.4 F (36.3 C), temperature source Oral, resp. rate 19, height _0  (1.778 m), weight 130 lb (59 kg), SpO2 98 %. Physical Exam: Pleasant  well-developed well-nourished white male in no acute distress. Head is normocephalic, atraumatic Neck  is supple without JVD Lungs clear auscultation with equal breath sounds bilaterally Heart examination reveals a regular rate and rhythm without S3, S4, murmurs. Abdomen is soft, nontender, nondistended. No hepatosplenomegaly or rigidity are noted. Rectal examination was deferred this time as colonoscopy report reviewed Oncology note reviewed, GI note reviewed  Assessment/Plan: Impression: Bleeding internal hemorrhoids, acute on chronic anemia plan: Will take patient to the OR tomorrow for extensive hemorrhoidectomy. The risks and benefits of the procedure including bleeding, infection, and the possible need for further surgery were fully explained to the patient, who gave informed consent. We will give 2 units packed red blood cells today in preparation for general anesthesia. Potassium will be supplemented.   Aviva Signs 04/13/2017, 8:41 AM

## 2017-04-13 NOTE — Care Management Note (Signed)
Case Management Note  Patient Details  Name: Marni Griffondward T Miles MRN: [id number] Date of Birth: [birth year]  Subjective/Objective:  Adm blood loss anemia. Extensive GI workup. Hemorrhoidectomy scheduled for 04/14/2017. From home, ind PTA. No HH or DME PTA.                Action/Plan: Plans to return home at DC. CM will follow to DC.    Expected Discharge Date:       04/15/2017           Expected Discharge Plan:  Home/Self Care  In-House Referral:  NA  Discharge planning Services  CM Consult  Post Acute Care Choice:  NA Choice offered to:  NA  DME Arranged:    DME Agency:     HH Arranged:    HH Agency:     Status of Service:  In process, will continue to follow  If discussed at Long Length of Stay Meetings, dates discussed:    Additional Comments:  Yazlyn Wentzel, Chrystine OilerSharley Diane, RN 04/13/2017, 10:56 AM

## 2017-04-13 NOTE — Progress Notes (Signed)
CRITICAL VALUE ALERT  Critical value received: Hemoglobin 6.9  Date of notification:  04/13/2017  Time of notification:  1335  Critical value read back: Yes  Nurse who received alert:  Johnsie CancelLisa Ulis Kaps   MD notified (1st page):  Verbally notified Zannie CoveA. Kadolph  Time of first page:  Verbally informed 1335  Time MD responded:  1335

## 2017-04-13 NOTE — Care Management Note (Signed)
Case Management Note  Patient Details  Name: Jeffrey [name] MRN: [id number] Date of Birth: March 02, 1961      If discussed at Long Length of Stay Meetings, dates discussed:  04/13/2017  Additional Comments: Scheduled for extensive hemorrhoidectomy 04/14/2017.  Brihany Butch, Chrystine OilerSharley Diane, RN 04/13/2017, 10:27 AM

## 2017-04-13 NOTE — Progress Notes (Signed)
Subjective: Rectal bleeding with BMs. Feels much more energized today. No abdominal pain. Tolerating diet. Plans for hemorrhoidectomy tomorrow by Dr. Lovell SheehanJenkins.   Objective: Vital signs in last 24 hours: Temp:  [97.4 F (36.3 C)-98.7 F (37.1 C)] 97.4 F (36.3 C) (05/22 0514) Pulse Rate:  [62-86] 73 (05/22 0514) Resp:  [19] 19 (05/22 0514) BP: (113-119)/(46-61) 113/46 (05/22 0514) SpO2:  [98 %-100 %] 99 % (05/22 0514) Last BM Date: 04/12/17 General:   Alert and oriented, pleasant Head:  Normocephalic and atraumatic. Eyes:  No icterus, sclera clear. Conjuctiva pink.  Abdomen:  Bowel sounds present, soft, non-tender, non-distended. No HSM or hernias noted. No rebound or guarding. No masses appreciated  Msk:  Symmetrical without gross deformities. Normal posture. Extremities:  Without  edema. Neurologic:  Alert and  oriented x4 Psych:  Alert and cooperative. Normal mood and affect.  Intake/Output from previous day: 05/21 0701 - 05/22 0700 In: 880 [P.O.:880] Out: 1300 [Urine:1300] Intake/Output this shift: No intake/output data recorded.  Lab Results:  Recent Labs  04/12/17 0405 04/12/17 1549 04/13/17 0423  WBC  --   --  4.8  HGB 7.8* 8.3* 7.5*  HCT 23.9* 25.6* 24.3*  PLT  --   --  285   BMET  Recent Labs  04/13/17 0423  NA 141  K 3.2*  CL 109  CO2 26  GLUCOSE 113*  BUN 5*  CREATININE 1.00  CALCIUM 8.8*   LFT  Recent Labs  04/13/17 0423  PROT 5.2*  ALBUMIN 3.0*  AST 18  ALT 8*  ALKPHOS 43  BILITOT 0.6    Studies/Results: Ct Head Wo Contrast  Result Date: 04/12/2017 CLINICAL DATA:  56 year old hypertensive male with visual loss right eye. No known injury. Initial encounter. EXAM: CT HEAD WITHOUT CONTRAST TECHNIQUE: Contiguous axial images were obtained from the base of the skull through the vertex without intravenous contrast. COMPARISON:  02/07/2017 CT. FINDINGS: Brain: No intracranial hemorrhage or CT evidence of large acute infarct. No  intracranial mass lesion noted on this unenhanced exam. Partially empty non expanded sella. Vascular: Vascular calcifications. Skull: No acute abnormality. Sinuses/Orbits: No acute orbital abnormality. Visualized paranasal sinuses are clear. Other: Visualized mastoid air cells and middle ear cavities are clear. IMPRESSION: No acute intracranial abnormality detected. Electronically Signed   By: Lacy DuverneySteven  Olson M.D.   On: 04/12/2017 13:19   Mr Maxine GlennMra Head Wo Contrast  Result Date: 04/12/2017 CLINICAL DATA:  RIGHT vision loss for week. Weakness. History of hypertension, smoker, hyperlipidemia. EXAM: MRI HEAD WITHOUT CONTRAST MRA HEAD WITHOUT CONTRAST TECHNIQUE: Multiplanar, multiecho pulse sequences of the brain and surrounding structures were obtained without intravenous contrast. Angiographic images of the head were obtained using MRA technique without contrast. COMPARISON:  CT HEAD Apr 12, 2017 at 1301 hours FINDINGS: MRI HEAD FINDINGS BRAIN: No reduced diffusion to suggest acute ischemia nor hyperacute demyelination. No susceptibility artifact to suggest hemorrhage. The ventricles and sulci are normal for patient's age. No suspicious parenchymal signal, masses or mass effect. No abnormal extra-axial fluid collections. VASCULAR: Normal major intracranial vascular flow voids present at skull base. SKULL AND UPPER CERVICAL SPINE: No abnormal sellar expansion. No suspicious calvarial bone marrow signal. Craniocervical junction maintained. SINUSES/ORBITS: Trace paranasal sinus mucosal thickening without air-fluid levels. Mastoid air cells are well aerated. The included ocular globes and orbital contents are non-suspicious. OTHER: Patient is edentulous. MRA HEAD FINDINGS ANTERIOR CIRCULATION: Normal flow related enhancement of the included cervical, petrous, cavernous and supraclinoid internal carotid arteries. Patent anterior communicating artery.  Normal flow related enhancement of the anterior and middle cerebral  arteries, including distal segments. No large vessel occlusion, high-grade stenosis, abnormal luminal irregularity, aneurysm. POSTERIOR CIRCULATION: Codominant vertebral artery's. Basilar artery is patent, with normal flow related enhancement of the main branch vessels. Normal flow related enhancement of the posterior cerebral arteries. No large vessel occlusion, high-grade stenosis, abnormal luminal irregularity, aneurysm. ANATOMIC VARIANTS: None. Source images and MIP images were reviewed. IMPRESSION: Negative noncontrast MRI head. Negative noncontrast MRA head. Electronically Signed   By: Awilda Metro M.D.   On: 04/12/2017 16:59   Mr Brain Wo Contrast  Result Date: 04/12/2017 CLINICAL DATA:  RIGHT vision loss for week. Weakness. History of hypertension, smoker, hyperlipidemia. EXAM: MRI HEAD WITHOUT CONTRAST MRA HEAD WITHOUT CONTRAST TECHNIQUE: Multiplanar, multiecho pulse sequences of the brain and surrounding structures were obtained without intravenous contrast. Angiographic images of the head were obtained using MRA technique without contrast. COMPARISON:  CT HEAD Apr 12, 2017 at 1301 hours FINDINGS: MRI HEAD FINDINGS BRAIN: No reduced diffusion to suggest acute ischemia nor hyperacute demyelination. No susceptibility artifact to suggest hemorrhage. The ventricles and sulci are normal for patient's age. No suspicious parenchymal signal, masses or mass effect. No abnormal extra-axial fluid collections. VASCULAR: Normal major intracranial vascular flow voids present at skull base. SKULL AND UPPER CERVICAL SPINE: No abnormal sellar expansion. No suspicious calvarial bone marrow signal. Craniocervical junction maintained. SINUSES/ORBITS: Trace paranasal sinus mucosal thickening without air-fluid levels. Mastoid air cells are well aerated. The included ocular globes and orbital contents are non-suspicious. OTHER: Patient is edentulous. MRA HEAD FINDINGS ANTERIOR CIRCULATION: Normal flow related  enhancement of the included cervical, petrous, cavernous and supraclinoid internal carotid arteries. Patent anterior communicating artery. Normal flow related enhancement of the anterior and middle cerebral arteries, including distal segments. No large vessel occlusion, high-grade stenosis, abnormal luminal irregularity, aneurysm. POSTERIOR CIRCULATION: Codominant vertebral artery's. Basilar artery is patent, with normal flow related enhancement of the main branch vessels. Normal flow related enhancement of the posterior cerebral arteries. No large vessel occlusion, high-grade stenosis, abnormal luminal irregularity, aneurysm. ANATOMIC VARIANTS: None. Source images and MIP images were reviewed. IMPRESSION: Negative noncontrast MRI head. Negative noncontrast MRA head. Electronically Signed   By: Awilda Metro M.D.   On: 04/12/2017 16:59   Nm Bowel Img Meckels  Result Date: 04/12/2017 CLINICAL DATA:  Gastrointestinal bleeding question Meckel's diverticulum EXAM: NUCLEAR MEDICINE MECKELS SCAN TECHNIQUE: Sequential abdominal images were obtained following intravenous injection of radiopharmaceutical. RADIOPHARMACEUTICALS:  9 millicuries Tc-3m pertechnetate IV COMPARISON:  GI bleeding study 04/07/2017 FINDINGS: Normal uptake of tracer within gastric mucosa. Blood pool distribution of tracer is initially visualized. Physiologic excretion of tracer by the kidneys into the urinary bladder, with minimally prominent RIGHT renal pelvis incidentally noted. No abnormal abdominal localization of pertechnetate is identified to suggest ectopic gastric mucosa and Meckel's diverticulum. IMPRESSION: Negative Meckel's scan. Electronically Signed   By: Ulyses Southward M.D.   On: 04/12/2017 12:16    Assessment: 56 year old male admitted with rectal bleeding and transfusion-dependent anemia. 2 units PRBCs this admission, last unit received 5/20. Thus far, colonoscopy X 2, EGD, capsule study completed. No old or fresh blood noted  on most recent colonoscopy. Meckel's scan negative. Appreciate Hematology and Surgical involvement. Plans for extensive hemorrhoidectomy tomorrow. Recommend supportive care, and we will follow peripherally.    Plan: Hemorrhoidectomy tomorrow Will follow peripherally   Gelene Mink, PhD, ANP-BC [facility]    LOS: 4 days    04/13/2017, 8:03 AM

## 2017-04-14 ENCOUNTER — Inpatient Hospital Stay (HOSPITAL_COMMUNITY): Payer: Medicare Other | Admitting: Anesthesiology

## 2017-04-14 ENCOUNTER — Encounter (HOSPITAL_COMMUNITY)
Admission: EM | Disposition: A | Discharge status: Home / Self Care | Payer: Self-pay | Source: Home / Self Care | Attending: Family Medicine

## 2017-04-14 ENCOUNTER — Encounter (HOSPITAL_COMMUNITY): Payer: Self-pay | Admitting: *Deleted

## 2017-04-14 DIAGNOSIS — K644 Residual hemorrhoidal skin tags: Secondary | ICD-10-CM

## 2017-04-14 DIAGNOSIS — K648 Other hemorrhoids: Secondary | ICD-10-CM

## 2017-04-14 HISTORY — PX: HEMORRHOID SURGERY: SHX153

## 2017-04-14 LAB — CBC
HCT: 29 % — ABNORMAL LOW (ref 39.0–52.0)
Hemoglobin: 9.6 g/dL — ABNORMAL LOW (ref 13.0–17.0)
MCH: 25.3 pg — ABNORMAL LOW (ref 26.0–34.0)
MCHC: 33.1 g/dL (ref 30.0–36.0)
MCV: 76.5 fL — ABNORMAL LOW (ref 78.0–100.0)
Platelets: 274 10*3/uL (ref 150–400)
RBC: 3.79 MIL/uL — ABNORMAL LOW (ref 4.22–5.81)
RDW: 20.6 % — ABNORMAL HIGH (ref 11.5–15.5)
WBC: 5.1 10*3/uL (ref 4.0–10.5)

## 2017-04-14 LAB — BASIC METABOLIC PANEL
Anion gap: 8 (ref 5–15)
BUN: 5 mg/dL — ABNORMAL LOW (ref 6–20)
CO2: 25 mmol/L (ref 22–32)
Calcium: 9.1 mg/dL (ref 8.9–10.3)
Chloride: 110 mmol/L (ref 101–111)
Creatinine, Ser: 0.95 mg/dL (ref 0.61–1.24)
GFR calc Af Amer: >60 mL/min (ref >60)
GFR calc non Af Amer: >60 mL/min (ref >60)
Glucose, Bld: 88 mg/dL (ref 65–99)
Potassium: 4.1 mmol/L (ref 3.5–5.1)
Sodium: 143 mmol/L (ref 135–145)

## 2017-04-14 LAB — METHYLMALONIC ACID, SERUM: Methylmalonic Acid, Quantitative: 90 nmol/L (ref 0–378)

## 2017-04-14 LAB — HEMOGLOBIN AND HEMATOCRIT, BLOOD
HCT: 25.6 % — ABNORMAL LOW (ref 39.0–52.0)
Hemoglobin: 8.4 g/dL — ABNORMAL LOW (ref 13.0–17.0)

## 2017-04-14 LAB — SURGICAL PCR SCREEN
MRSA, PCR: NEGATIVE
Staphylococcus aureus: POSITIVE — AB

## 2017-04-14 LAB — ANTI-PARIETAL ANTIBODY: Parietal Cell Antibody-IgG: 20.7 Units — ABNORMAL HIGH (ref 0.0–20.0)

## 2017-04-14 SURGERY — HEMORRHOIDECTOMY
Anesthesia: General

## 2017-04-14 MED ORDER — OXYCODONE-ACETAMINOPHEN 5-325 MG PO TABS
1.0000 | ORAL_TABLET | ORAL | Status: DC | PRN
  Administered 2017-04-14 – 2017-04-15 (×3): 2 via ORAL
  Filled 2017-04-14 (×3): qty 2

## 2017-04-14 MED ORDER — FENTANYL CITRATE (PF) 100 MCG/2ML IJ SOLN: 25.0000 ug | INTRAMUSCULAR | Status: DC | PRN

## 2017-04-14 MED ORDER — LIDOCAINE HCL (PF) 1 % IJ SOLN
INTRAMUSCULAR | Status: AC
  Filled 2017-04-14: qty 5

## 2017-04-14 MED ORDER — SODIUM CHLORIDE 0.9 % IR SOLN
Status: DC | PRN
  Administered 2017-04-14: 1

## 2017-04-14 MED ORDER — SIMETHICONE 80 MG PO CHEW: 40.0000 mg | CHEWABLE_TABLET | Freq: Four times a day (QID) | ORAL | Status: DC | PRN

## 2017-04-14 MED ORDER — FENTANYL CITRATE (PF) 100 MCG/2ML IJ SOLN
25.0000 ug | Freq: Once | INTRAMUSCULAR | Status: AC
  Administered 2017-04-14: 25 ug via INTRAVENOUS

## 2017-04-14 MED ORDER — LIDOCAINE HCL (CARDIAC) 10 MG/ML IV SOLN
INTRAVENOUS | Status: DC | PRN
Start: 2017-04-14 — End: 2017-04-14
  Administered 2017-04-14: 40 mg via INTRAVENOUS

## 2017-04-14 MED ORDER — LORAZEPAM 2 MG/ML IJ SOLN: 1.0000 mg | INTRAMUSCULAR | Status: DC | PRN

## 2017-04-14 MED ORDER — FENTANYL CITRATE (PF) 100 MCG/2ML IJ SOLN
INTRAMUSCULAR | Status: AC
  Filled 2017-04-14: qty 2

## 2017-04-14 MED ORDER — BUPIVACAINE LIPOSOME 1.3 % IJ SUSP
INTRAMUSCULAR | Status: AC
Start: 2017-04-14 — End: 2017-04-14
  Filled 2017-04-14: qty 20

## 2017-04-14 MED ORDER — MIDAZOLAM HCL 2 MG/2ML IJ SOLN
1.0000 mg | INTRAMUSCULAR | Status: AC
  Administered 2017-04-14 (×2): 2 mg via INTRAVENOUS
  Filled 2017-04-14: qty 2

## 2017-04-14 MED ORDER — PROPOFOL 10 MG/ML IV BOLUS
INTRAVENOUS | Status: AC
  Filled 2017-04-14: qty 20

## 2017-04-14 MED ORDER — LIDOCAINE VISCOUS 2 % MT SOLN
OROMUCOSAL | Status: AC
  Filled 2017-04-14: qty 15

## 2017-04-14 MED ORDER — PROPOFOL 10 MG/ML IV BOLUS
INTRAVENOUS | Status: DC | PRN
  Administered 2017-04-14: 160 mg via INTRAVENOUS
  Administered 2017-04-14: 25 mg via INTRAVENOUS

## 2017-04-14 MED ORDER — KETOROLAC TROMETHAMINE 30 MG/ML IJ SOLN
30.0000 mg | Freq: Once | INTRAMUSCULAR | Status: AC
  Administered 2017-04-14: 30 mg via INTRAVENOUS

## 2017-04-14 MED ORDER — METRONIDAZOLE IN NACL 5-0.79 MG/ML-% IV SOLN
500.0000 mg | INTRAVENOUS | Status: AC
Start: 2017-04-14 — End: 2017-04-14
  Administered 2017-04-14: 500 mg via INTRAVENOUS

## 2017-04-14 MED ORDER — FENTANYL CITRATE (PF) 100 MCG/2ML IJ SOLN
INTRAMUSCULAR | Status: DC | PRN
  Administered 2017-04-14 (×3): 25 ug via INTRAVENOUS

## 2017-04-14 MED ORDER — KETOROLAC TROMETHAMINE 30 MG/ML IJ SOLN
INTRAMUSCULAR | Status: AC
  Filled 2017-04-14: qty 1

## 2017-04-14 MED ORDER — DOCUSATE SODIUM 100 MG PO CAPS: 100.0000 mg | ORAL_CAPSULE | Freq: Two times a day (BID) | ORAL | Status: DC | PRN

## 2017-04-14 MED ORDER — HYDROMORPHONE HCL 1 MG/ML IJ SOLN
1.0000 mg | INTRAMUSCULAR | Status: DC | PRN
  Administered 2017-04-14 – 2017-04-15 (×4): 1 mg via INTRAVENOUS
  Filled 2017-04-14 (×4): qty 1

## 2017-04-14 MED ORDER — BUPIVACAINE LIPOSOME 1.3 % IJ SUSP
INTRAMUSCULAR | Status: DC | PRN
  Administered 2017-04-14: 20 mL

## 2017-04-14 MED ORDER — FENTANYL CITRATE (PF) 250 MCG/5ML IJ SOLN
INTRAMUSCULAR | Status: AC
  Filled 2017-04-14: qty 5

## 2017-04-14 MED ORDER — METRONIDAZOLE IN NACL 5-0.79 MG/ML-% IV SOLN
INTRAVENOUS | Status: AC
  Filled 2017-04-14: qty 100

## 2017-04-14 MED ORDER — LACTATED RINGERS IV SOLN
INTRAVENOUS | Status: DC
  Administered 2017-04-14: 09:00:00 via INTRAVENOUS

## 2017-04-14 MED ORDER — LIDOCAINE VISCOUS 2 % MT SOLN
OROMUCOSAL | Status: DC | PRN
  Administered 2017-04-14: 1 via OROMUCOSAL

## 2017-04-14 MED ORDER — MIDAZOLAM HCL 2 MG/2ML IJ SOLN
INTRAMUSCULAR | Status: AC
  Filled 2017-04-14: qty 2

## 2017-04-14 SURGICAL SUPPLY — 28 items
BAG HAMPER (MISCELLANEOUS) ×3 IMPLANT
CLOTH BEACON ORANGE TIMEOUT ST (SAFETY) ×3 IMPLANT
COVER LIGHT HANDLE STERIS (MISCELLANEOUS) ×6 IMPLANT
DECANTER SPIKE VIAL GLASS SM (MISCELLANEOUS) ×3 IMPLANT
DRAPE PROXIMA HALF (DRAPES) ×3 IMPLANT
ELECT REM PT RETURN 9FT ADLT (ELECTROSURGICAL) ×3
ELECTRODE REM PT RTRN 9FT ADLT (ELECTROSURGICAL) ×1 IMPLANT
FORMALIN 10 PREFIL 120ML (MISCELLANEOUS) ×3 IMPLANT
GAUZE SPONGE 4X4 12PLY STRL (GAUZE/BANDAGES/DRESSINGS) ×6 IMPLANT
GLOVE BIOGEL PI IND STRL 7.0 (GLOVE) ×1 IMPLANT
GLOVE BIOGEL PI INDICATOR 7.0 (GLOVE) ×2
GLOVE SURG SS PI 7.5 STRL IVOR (GLOVE) ×6 IMPLANT
GOWN STRL REUS W/ TWL XL LVL3 (GOWN DISPOSABLE) ×1 IMPLANT
GOWN STRL REUS W/TWL LRG LVL3 (GOWN DISPOSABLE) ×3 IMPLANT
GOWN STRL REUS W/TWL XL LVL3 (GOWN DISPOSABLE) ×3
HEMOSTAT SURGICEL 4X8 (HEMOSTASIS) ×3 IMPLANT
KIT ROOM TURNOVER AP CYSTO (KITS) ×3 IMPLANT
LIGASURE IMPACT 36 18CM CVD LR (INSTRUMENTS) ×3 IMPLANT
MANIFOLD NEPTUNE II (INSTRUMENTS) ×3 IMPLANT
NEEDLE HYPO 22GX1.5 SAFETY (NEEDLE) ×3 IMPLANT
NS IRRIG 1000ML POUR BTL (IV SOLUTION) ×3 IMPLANT
PACK PERI GYN (CUSTOM PROCEDURE TRAY) ×3 IMPLANT
PAD ARMBOARD 7.5X6 YLW CONV (MISCELLANEOUS) ×3 IMPLANT
SET BASIN LINEN APH (SET/KITS/TRAYS/PACK) ×3 IMPLANT
SURGILUBE 3G PEEL PACK STRL (MISCELLANEOUS) ×3 IMPLANT
SUT SILK 0 FSL (SUTURE) ×3 IMPLANT
SUT VIC AB 2-0 CT2 27 (SUTURE) IMPLANT
SYR 20CC LL (SYRINGE) ×3 IMPLANT

## 2017-04-14 NOTE — Anesthesia Procedure Notes (Signed)
Procedure Name: LMA Insertion Date/Time: 04/14/2017 9:46 AM Performed by: Jeffrey OctaveANIEL, Jeffrey [name] Pre-anesthesia Checklist: Patient identified, Patient being monitored, Emergency Drugs available, Timeout performed and Suction available Patient Re-evaluated:Patient Re-evaluated prior to inductionOxygen Delivery Method: Circle System Utilized Preoxygenation: Pre-oxygenation with 100% oxygen Intubation Type: IV induction Ventilation: Mask ventilation without difficulty LMA: LMA inserted LMA Size: 4.0 Number of attempts: 1 Placement Confirmation: positive ETCO2 and breath sounds checked- equal and bilateral

## 2017-04-14 NOTE — Transfer of Care (Signed)
Immediate Anesthesia Transfer of Care Note  Patient: Jeffrey [name]  Procedure(s) Performed: Procedure(s): HEMORRHOIDECTOMY (N/A)  Patient Location: PACU  Anesthesia Type:General  Level of Consciousness: awake and alert   Airway & Oxygen Therapy: Patient Spontanous Breathing and Patient connected to nasal cannula oxygen  Post-op Assessment: Report given to RN  Post vital signs: Reviewed and stable  Last Vitals:  Vitals:   04/14/17 0546 04/14/17 0915  BP: 107/72 114/72  Pulse: (!) 56   Resp: 18 (!) 33  Temp: 36.5 C     Last Pain:  Vitals:   04/14/17 0600  TempSrc:   PainSc: Asleep      Patients Stated Pain Goal: 0 (04/13/17 2030)  Complications: No apparent anesthesia complications

## 2017-04-14 NOTE — Anesthesia Preprocedure Evaluation (Addendum)
Anesthesia Evaluation  Patient identified by MRN, date of birth, ID band Patient awake    Reviewed: Allergy & Precautions, NPO status , Patient's Chart, lab work & pertinent test results  Airway Mallampati: I  TM Distance: >3 FB Neck ROM: Full    Dental  (+) Edentulous Upper, Edentulous Lower   Pulmonary shortness of breath and with exertion, COPD,  COPD inhaler, Current Smoker,    breath sounds clear to auscultation       Cardiovascular hypertension, Pt. on medications + Peripheral Vascular Disease   Rhythm:Regular Rate:Normal     Neuro/Psych PSYCHIATRIC DISORDERS Anxiety Depression Bipolar Disorder    GI/Hepatic PUD, neg GERD  ,GI bleed    Endo/Other    Renal/GU      Musculoskeletal   Abdominal   Peds  Hematology  (+) anemia ,   Anesthesia Other Findings   Reproductive/Obstetrics                            Anesthesia Physical Anesthesia Plan  ASA: III  Anesthesia Plan: General   Post-op Pain Management:    Induction: Intravenous  Airway Management Planned: LMA  Additional Equipment:   Intra-op Plan:   Post-operative Plan: Extubation in OR  Informed Consent: I have reviewed the patients History and Physical, chart, labs and discussed the procedure including the risks, benefits and alternatives for the proposed anesthesia with the patient or authorized representative who has indicated his/her understanding and acceptance.     Plan Discussed with:   Anesthesia Plan Comments:         Anesthesia Quick Evaluation

## 2017-04-14 NOTE — Progress Notes (Signed)
Patient was concerned about what it was going to be like once he had to have an BM.  He has some concerns about eating because of the the fear of the BM.  I voiced to him that I would discuss the matter with Dr. Lovell SheehanJenkins and get back with him.  I discussed the issue with Dr. Lovell SheehanJenkins and [facility] that he is okay to [facility].  He gave new orders for a stool softner.  Spoke with the patient about this and he verbalized understanding.

## 2017-04-14 NOTE — Progress Notes (Addendum)
PROGRESS NOTE  Jeffrey [name] UJW:[id number] DOB: [birth year] DOA: 04/07/2017 PCP: Barbie Banner, MD  Brief History:  56 y.o.malewith a past medical history significant for COPD, Bipolar and chronic rectal bleedingwho presents with rectal bleeding and abnormal labs.  The patient has chronic rectal bleeding, in the past this has been serious enough to require transfusion. Over the last few weeks, he has had bleedingfrom his rectum again. He describes this as "sitting on the toilet and blood drips out". He had labs drawn by GI two days ago that showed a 2-pt drop in Hgb. His bleeding persisted and today he talked to them and was recommended to go to the ER. He has had no hematemesis, melena. He takes baby aspirin, no other platelet agents or anticoagulants. He had a colonoscopy 1 week ago that showed hemorrhoids, was being prepped for a capsule study.  In the ED patient was found to be Afebrile, heart rate 71, respirations and pulse ox normal, blood pressure 121/74. Na 135, K 4.0, Cr 1.01, WBC 6.7K,Hgb 8.9stable from 2 days ago. The case was discussed with gastroenterology, who recommended a tagged red cell scan and TRH were asked to admit  Patient continued to have rectal bleeding and his H/H continued to drop.  He underwent tagged red blood cell scan as well as capsule study both of which did not show cause of bleeding.  Gastroenterology ordered PT/INR both of which were normal and ordered platelet assay which showed abnormalities normally seen in aspirin.  Hematology consulted.  Patient underwent a Meckel's scan secondary to continued GI bleeding.  General Surgery consulted.  Plan for hemorrhoidectomy on 5/22.  Assessment/Plan:  Acute blood loss anemia from rectal bleeding: - workup thus far negative for active site of bleeding--neg NM RBC and neg Meckel's scan -04/11/2017 colonoscopy--all blood or fresh blood in the ileum or colon; nonbleeding large internal and external  hemorrhoids - received 5 units PRBC total for the admission - 04/14/17--hemorrhoidectomy --Dr. Lovell Sheehan - General Surgery, Gastroenterology, Hematology consulted - hematology felt anemia was due to chronic blood loss, no evidence of primary hematologic issue - follow serial H/H  Bipolar disorder: - Continue Depakote, Zyprexa - Continue Prozac - Continue Klonopin  COPD: - Continue Dulera - Continue Spiriva - Albuterol PRN -stable on RA  Vision loss: - CT of head negative - negative MRI/ MRA - cannot give aspirin due to GIB   Disposition Plan:   Home when cleared by surgery and Hgb stable Family Communication:   Family at bedside  Consultants:  GI, General surgery, hematology  Code Status:  FULL / DNR  DVT Prophylaxis:  [place] Heparin /  Lovenox   Procedures: As Listed in Progress Note Above  Antibiotics: None    Subjective: Patient denies fevers, chills, headache, chest pain, dyspnea, nausea, vomiting, diarrhea, abdominal pain, dysuria, hematuria, hematochezia, and melena.   Objective: Vitals:   04/14/17 2138 04/15/17 0606 04/15/17 0747 04/15/17 0748  BP: 127/72 (!) 112/55    Pulse: 75 88    Resp: 16 16    Temp: 97.5 F (36.4 C) 99 F (37.2 C)    TempSrc: Oral Oral    SpO2: 96% 98% 96% 96%  Weight:      Height:        Intake/Output Summary (Last 24 hours) at 04/15/17 0834 Last data filed at 04/15/17 0500  Gross per 24 hour  Intake  1540 ml  Output                0 ml  Net             1540 ml   Weight change:  Exam:   General:  Pt is alert, follows commands appropriately, not in acute distress  HEENT: No icterus, No thrush, No neck mass, [place]/AT  Cardiovascular: RRR, S1/S2, no rubs, no gallops  Respiratory: CTA bilaterally, no wheezing, no crackles, no rhonchi  Abdomen: Soft/+BS, non tender, non distended, no guarding  Extremities: No edema, No lymphangitis, No petechiae, No rashes, no synovitis   Data Reviewed: I have  personally reviewed following labs and imaging studies Basic Metabolic Panel:  Recent Labs Lab 04/13/17 0423 04/14/17 0535 04/15/17 0613  NA 141 143 137  K 3.2* 4.1 4.3  CL 109 110 108  CO2 26 25 25   GLUCOSE 113* 88 97  BUN 5* 5* 6  CREATININE 1.00 0.95 0.93  CALCIUM 8.8* 9.1 8.6*   Liver Function Tests:  Recent Labs Lab 04/13/17 0423  AST 18  ALT 8*  ALKPHOS 43  BILITOT 0.6  PROT 5.2*  ALBUMIN 3.0*   No results for input(s): LIPASE, AMYLASE in the last 168 hours. No results for input(s): AMMONIA in the last 168 hours. Coagulation Profile:  Recent Labs Lab 04/10/17 0406  INR 1.14   CBC:  Recent Labs Lab 04/13/17 0423 04/13/17 1307 04/13/17 2150 04/14/17 0535 04/14/17 1601 04/15/17 0613  WBC 4.8  --   --  5.1  --  9.9  NEUTROABS 3.3  --   --   --   --   --   HGB 7.5* 6.9* 9.5* 9.6* 8.4* 8.7*  HCT 24.3* 21.6* 28.7* 29.0* 25.6* 26.4*  MCV 75.9*  --   --  76.5*  --  77.6*  PLT 285  --   --  274  --  240   Cardiac Enzymes: No results for input(s): CKTOTAL, CKMB, CKMBINDEX, TROPONINI in the last 168 hours. BNP: Invalid input(s): POCBNP CBG: No results for input(s): GLUCAP in the last 168 hours. HbA1C: No results for input(s): HGBA1C in the last 72 hours. Urine analysis:    Component Value Date/Time   COLORURINE YELLOW 03/01/2011 1427   APPEARANCEUR CLEAR 03/01/2011 1427   LABSPEC 1.010 03/01/2011 1427   PHURINE 7.0 03/01/2011 1427   GLUCOSEU NEGATIVE 03/01/2011 1427   HGBUR NEGATIVE 03/01/2011 1427   BILIRUBINUR NEGATIVE 03/01/2011 1427   KETONESUR NEGATIVE 03/01/2011 1427   PROTEINUR NEGATIVE 03/01/2011 1427   UROBILINOGEN 0.2 03/01/2011 1427   NITRITE NEGATIVE 03/01/2011 1427   LEUKOCYTESUR  03/01/2011 1427    NEGATIVE MICROSCOPIC NOT DONE ON URINES WITH NEGATIVE PROTEIN, BLOOD, LEUKOCYTES, NITRITE, OR GLUCOSE <1000 mg/dL.   Sepsis Labs: @LABRCNTIP (procalcitonin:4,lacticidven:4) ) Recent Results (from the past 240 hour(s))  Surgical PCR  screen     Status: Abnormal   Collection Time: 04/14/17  3:44 AM  Result Value Ref Range Status   MRSA, PCR NEGATIVE NEGATIVE Final   Staphylococcus aureus POSITIVE (A) NEGATIVE Final    Comment:        The Xpert SA Assay (FDA approved for NASAL specimens in patients over 56 years of age), is one component of a comprehensive surveillance program.  Test performance has been validated by [facility] for patients greater than or equal to 81 year old. It is not intended to diagnose infection nor to guide or monitor treatment. RESULT CALLED TO, READ BACK BY AND  VERIFIED WITH: WATKINS,T@840  BY MATTHEWS,B 04/14/17      Scheduled Meds: . atorvastatin  20 mg Oral Daily  . Chlorhexidine Gluconate Cloth  6 each Topical Daily  . clonazePAM  0.5 mg Oral BID  . divalproex  750 mg Oral QHS  . fenofibrate  160 mg Oral Daily  . FLUoxetine  40 mg Oral BH-q7a  . folic acid  1 mg Oral Daily  . mometasone-formoterol  2 puff Inhalation BID  . mupirocin ointment  1 application Nasal BID  . OLANZapine  5 mg Oral QHS  . pantoprazole  40 mg Oral QAC breakfast  . potassium chloride  20 mEq Oral TID  . tiotropium  18 mcg Inhalation Daily   Continuous Infusions:   Procedures/Studies: Ct Head Wo Contrast  Result Date: 04/12/2017 CLINICAL DATA:  56 year old hypertensive male with visual loss right eye. No known injury. Initial encounter. EXAM: CT HEAD WITHOUT CONTRAST TECHNIQUE: Contiguous axial images were obtained from the base of the skull through the vertex without intravenous contrast. COMPARISON:  02/07/2017 CT. FINDINGS: Brain: No intracranial hemorrhage or CT evidence of large acute infarct. No intracranial mass lesion noted on this unenhanced exam. Partially empty non expanded sella. Vascular: Vascular calcifications. Skull: No acute abnormality. Sinuses/Orbits: No acute orbital abnormality. Visualized paranasal sinuses are clear. Other: Visualized mastoid air cells and middle ear cavities are  clear. IMPRESSION: No acute intracranial abnormality detected. Electronically Signed   By: Lacy Duverney M.D.   On: 04/12/2017 13:19   Mr Maxine Glenn Head Wo Contrast  Result Date: 04/12/2017 CLINICAL DATA:  RIGHT vision loss for week. Weakness. History of hypertension, smoker, hyperlipidemia. EXAM: MRI HEAD WITHOUT CONTRAST MRA HEAD WITHOUT CONTRAST TECHNIQUE: Multiplanar, multiecho pulse sequences of the brain and surrounding structures were obtained without intravenous contrast. Angiographic images of the head were obtained using MRA technique without contrast. COMPARISON:  CT HEAD Apr 12, 2017 at 1301 hours FINDINGS: MRI HEAD FINDINGS BRAIN: No reduced diffusion to suggest acute ischemia nor hyperacute demyelination. No susceptibility artifact to suggest hemorrhage. The ventricles and sulci are normal for patient's age. No suspicious parenchymal signal, masses or mass effect. No abnormal extra-axial fluid collections. VASCULAR: Normal major intracranial vascular flow voids present at skull base. SKULL AND UPPER CERVICAL SPINE: No abnormal sellar expansion. No suspicious calvarial bone marrow signal. Craniocervical junction maintained. SINUSES/ORBITS: Trace paranasal sinus mucosal thickening without air-fluid levels. Mastoid air cells are well aerated. The included ocular globes and orbital contents are non-suspicious. OTHER: Patient is edentulous. MRA HEAD FINDINGS ANTERIOR CIRCULATION: Normal flow related enhancement of the included cervical, petrous, cavernous and supraclinoid internal carotid arteries. Patent anterior communicating artery. Normal flow related enhancement of the anterior and middle cerebral arteries, including distal segments. No large vessel occlusion, high-grade stenosis, abnormal luminal irregularity, aneurysm. POSTERIOR CIRCULATION: Codominant vertebral artery's. Basilar artery is patent, with normal flow related enhancement of the main branch vessels. Normal flow related enhancement of the  posterior cerebral arteries. No large vessel occlusion, high-grade stenosis, abnormal luminal irregularity, aneurysm. ANATOMIC VARIANTS: None. Source images and MIP images were reviewed. IMPRESSION: Negative noncontrast MRI head. Negative noncontrast MRA head. Electronically Signed   By: Awilda Metro M.D.   On: 04/12/2017 16:59   Mr Brain Wo Contrast  Result Date: 04/12/2017 CLINICAL DATA:  RIGHT vision loss for week. Weakness. History of hypertension, smoker, hyperlipidemia. EXAM: MRI HEAD WITHOUT CONTRAST MRA HEAD WITHOUT CONTRAST TECHNIQUE: Multiplanar, multiecho pulse sequences of the brain and surrounding structures were obtained without intravenous contrast. Angiographic images of the  head were obtained using MRA technique without contrast. COMPARISON:  CT HEAD Apr 12, 2017 at 1301 hours FINDINGS: MRI HEAD FINDINGS BRAIN: No reduced diffusion to suggest acute ischemia nor hyperacute demyelination. No susceptibility artifact to suggest hemorrhage. The ventricles and sulci are normal for patient's age. No suspicious parenchymal signal, masses or mass effect. No abnormal extra-axial fluid collections. VASCULAR: Normal major intracranial vascular flow voids present at skull base. SKULL AND UPPER CERVICAL SPINE: No abnormal sellar expansion. No suspicious calvarial bone marrow signal. Craniocervical junction maintained. SINUSES/ORBITS: Trace paranasal sinus mucosal thickening without air-fluid levels. Mastoid air cells are well aerated. The included ocular globes and orbital contents are non-suspicious. OTHER: Patient is edentulous. MRA HEAD FINDINGS ANTERIOR CIRCULATION: Normal flow related enhancement of the included cervical, petrous, cavernous and supraclinoid internal carotid arteries. Patent anterior communicating artery. Normal flow related enhancement of the anterior and middle cerebral arteries, including distal segments. No large vessel occlusion, high-grade stenosis, abnormal luminal  irregularity, aneurysm. POSTERIOR CIRCULATION: Codominant vertebral artery's. Basilar artery is patent, with normal flow related enhancement of the main branch vessels. Normal flow related enhancement of the posterior cerebral arteries. No large vessel occlusion, high-grade stenosis, abnormal luminal irregularity, aneurysm. ANATOMIC VARIANTS: None. Source images and MIP images were reviewed. IMPRESSION: Negative noncontrast MRI head. Negative noncontrast MRA head. Electronically Signed   By: Awilda Metro M.D.   On: 04/12/2017 16:59   Nm Gi Blood Loss  Result Date: 04/07/2017 CLINICAL DATA:  GI bleed. Gastrointestinal hemorrhage associated with other gastritis. EXAM: NUCLEAR MEDICINE GASTROINTESTINAL BLEEDING SCAN TECHNIQUE: Sequential abdominal images were obtained following intravenous administration of Tc-28m labeled red blood cells. Imaging obtained for 2 hours. RADIOPHARMACEUTICALS:  22 mCi Tc-50m in-vitro labeled red cells. COMPARISON:  None. FINDINGS: There no scintigraphic findings to localize source of GI bleed. Central pelvic activity does not course along the course of the GI tract is likely secondary to bladder activity. Peroneal/ penile activity also noted. IMPRESSION: No scintigraphic localization of GI bleed. Electronically Signed   By: Rubye Oaks M.D.   On: 04/07/2017 23:11   Nm Bowel Img Meckels  Result Date: 04/12/2017 CLINICAL DATA:  Gastrointestinal bleeding question Meckel's diverticulum EXAM: NUCLEAR MEDICINE MECKELS SCAN TECHNIQUE: Sequential abdominal images were obtained following intravenous injection of radiopharmaceutical. RADIOPHARMACEUTICALS:  9 millicuries Tc-57m pertechnetate IV COMPARISON:  GI bleeding study 04/07/2017 FINDINGS: Normal uptake of tracer within gastric mucosa. Blood pool distribution of tracer is initially visualized. Physiologic excretion of tracer by the kidneys into the urinary bladder, with minimally prominent RIGHT renal pelvis incidentally  noted. No abnormal abdominal localization of pertechnetate is identified to suggest ectopic gastric mucosa and Meckel's diverticulum. IMPRESSION: Negative Meckel's scan. Electronically Signed   By: Ulyses Southward M.D.   On: 04/12/2017 12:16    Zamirah Denny, DO  [facility] Pager [phone]  If 7PM-7AM, please contact night-coverage [email or web address] Password [facility] 04/15/2017, 8:34 AM   LOS: 6 days

## 2017-04-14 NOTE — Interval H&P Note (Signed)
History and Physical Interval Note:  04/14/2017 9:08 AM  Jeffrey GriffonEdward T [name]  has presented today for surgery, with the diagnosis of bleeding hemorrhoids  The various methods of treatment have been discussed with the patient and family. After consideration of risks, benefits and other options for treatment, the patient has consented to  Procedure(s): HEMORRHOIDECTOMY (N/A) as a surgical intervention .  The patient's history has been reviewed, patient examined, no change in status, stable for surgery.  I have reviewed the patient's chart and labs.  Questions were answered to the patient's satisfaction.     Franky MachoMark Chrishauna Mee

## 2017-04-14 NOTE — H&P (View-Only) (Signed)
Reason for Consult: Rectal bleeding Referring Physician: Dr. Kadolph  Jeffrey [name] is an 56 y.o. male.  HPI: Patient is a 56-year-old white male multiple medical problems who has had anemia secondary to acute on chronic blood loss. He has had an extensive workup for source of his GI bleeding, and the only thing that has been found on examination are hemorrhoids. He states he has had intermittent bleeding for some time now. He denies any pain at the present time. He has had some bowel movements recently which have been without blood. He has received multiple blood transfusions.  Past Medical History:  Diagnosis Date  . Acute blood loss anemia 02/07/2017  . Anxiety   . Arthritis    deg disease, bulging disk,  shoulder level  . Bipolar disorder (HCC)   . Bipolar disorder (HCC)   . 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 Depression    anxiety  . Hyperlipidemia   . Hypertension   . Peptic ulcer disease    Review  . Pneumonia   . [name] (peripheral vascular disease) (HCC) 06/18/2015  . Shortness of breath     Past Surgical History:  Procedure Laterality Date  . BIOPSY  02/09/2017   Procedure: BIOPSY;  Surgeon: Sandi L Fields, MD;  [facility];  Service: Endoscopy;;  duodenal gastric  . COLONOSCOPY  03/14/2007   NUR:Normal colonoscopy and terminal ileoscopy except external hemorrhoids  . COLONOSCOPY N/A 05/05/2013   Dr. Rourk: external/internal anal canal hemorrhoids, unable to intubate TI, segemental biopsies unremarkable   . COLONOSCOPY N/A 04/11/2017   Procedure: COLONOSCOPY;  Surgeon: Fields, Sandi L, MD;  [facility];  Service: Endoscopy;  Laterality: N/A;  . COLONOSCOPY WITH PROPOFOL N/A 03/31/2017   Procedure: COLONOSCOPY WITH PROPOFOL;  Surgeon: Rourk, Robert M, MD;  [facility];  Service: Endoscopy;  Laterality: N/A;  1:45pm  . ESOPHAGOGASTRODUODENOSCOPY  03/14/2007   NUR:Nonerosive antral gastritis with bulbar duodenitis/paucity to postbulbar duodenal  folds and biopsy were benign with no evidence of villous atrophy.  . ESOPHAGOGASTRODUODENOSCOPY (EGD) WITH PROPOFOL N/A 02/09/2017   Procedure: ESOPHAGOGASTRODUODENOSCOPY (EGD) WITH PROPOFOL;  Surgeon: Sandi L Fields, MD;  [facility];  Service: Endoscopy;  Laterality: N/A;  . GIVENS CAPSULE STUDY N/A 04/08/2017   Procedure: GIVENS CAPSULE STUDY;  Surgeon: Rourk, Robert M, MD;  [facility];  Service: Endoscopy;  Laterality: N/A;  . HAND SURGERY     left, secondary to self-inflicted laceration  . SHOULDER SURGERY     right  . TOE SURGERY     left great toe , amputated-lawnmover accident    Family History  Problem Relation Age of Onset  . Breast cancer Mother        deceased  . Heart disease Father   . Depression Daughter   . Anxiety disorder Daughter   . Anxiety disorder Son   . Depression Son   . Asthma Brother   . Heart attack Maternal Aunt   . Heart attack Maternal Uncle   . Heart attack Paternal Aunt   . Heart attack Paternal Uncle   . Heart attack Maternal Grandmother   . Heart attack Maternal Grandfather   . Emphysema Maternal Grandfather   . Heart attack Paternal Grandmother   . Heart attack Paternal Grandfather   . Colon cancer Neg Hx   . Liver disease Neg Hx     Social History:  reports that he has been smoking Cigarettes.  He started smoking about 45   years ago. He has a 20.00 pack-year smoking history. He has never used smokeless tobacco. He reports that he uses drugs, including Marijuana. He reports that he does not drink alcohol.  Allergies:  Allergies  Allergen Reactions  . Augmentin [Amoxicillin-Pot Clavulanate] Rash and Other (See Comments)    Has patient had a PCN reaction causing immediate rash, facial/tongue/throat swelling, SOB or lightheadedness with hypotension: No Has patient had a PCN reaction causing severe rash involving mucus membranes or skin necrosis: No Has patient had a PCN reaction that required hospitalization No Has  patient had a PCN reaction occurring within the last 10 years: Yes If all of the above answers are "NO", then may proceed with Cephalosporin use.  . Ace Inhibitors Hives    Medications:  Scheduled: . atorvastatin  20 mg Oral Daily  . clonazePAM  0.5 mg Oral BID  . divalproex  750 mg Oral QHS  . fenofibrate  160 mg Oral Daily  . FLUoxetine  40 mg Oral BH-q7a  . folic acid  1 mg Oral Daily  . mometasone-formoterol  2 puff Inhalation BID  . OLANZapine  5 mg Oral QHS  . pantoprazole  40 mg Oral QAC breakfast  . potassium chloride  20 mEq Oral TID  . tiotropium  18 mcg Inhalation Daily    Results for orders placed or performed during the hospital encounter of 04/07/17 (from the past 48 hour(s))  Hemoglobin and hematocrit, blood     Status: Abnormal   Collection Time: 04/11/17  3:39 PM  Result Value Ref Range   Hemoglobin 7.9 (L) 13.0 - 17.0 g/dL   HCT 24.5 (L) 39.0 - 52.0 %  Von Willebrand panel     Status: None   Collection Time: 04/11/17  3:39 PM  Result Value Ref Range   Coagulation Factor VIII 122 57 - 163 %   Ristocetin Co-factor, Plasma 109 50 - 200 %    Comment: (NOTE) Performed At: [facility] [address] [place], [place] [id number] [place] William F MD Ph:[phone]    Von Willebrand Antigen, Plasma 148 50 - 200 %    Comment: (NOTE) This test was developed and its performance characteristics determined by [facility]. It has not been cleared or approved by the Food and Drug Administration.   Coag Studies Interp Report     Status: None   Collection Time: 04/11/17  3:39 PM  Result Value Ref Range   Interpretation Note     Comment: (NOTE) ------------------------------- COAGULATION: VON WILLEBRAND FACTOR ASSESSMENT CURRENT RESULTS ASSESSMENT The VWF:Ag is normal. The VWF:RCo is normal. The FVIII is normal. VON WILLEBRAND FACTOR ASSESSMENT CURRENT RESULTS INTERPRETATION - These results are not consistent with a diagnosis of VWD according to the current  NHLBI guideline. VON WILLEBRAND FACTOR ASSESSMENT - Results may be falsely elevated and possibly falsely normal as VWF and FVIII may increase in samples drawn from patients (particularly children) who are visibly stressed at the time of phlebotomy, as acute phase reactants, or in response to certain drug therapies such as desmopressin. Repeat testing may be necessary before excluding a diagnosis of VWD especially if the clinical suspicion is high for an underlying bleeding disorder. The setting for phlebotomy should be as calm as possible and patients should be encouraged to sit quietly prior to the blood draw. VON WILLEBRAND FACTOR ASSESSMENT  DEFINITIONS - VWD - von Willebrand disease; VWF - von Willebrand factor; VWF:Ag - VWF antigen; VWF:RCo - VWF ristocetin cofactor activity; FVIII - factor VIII activity. -   MEDICAL DIRECTOR: For questions regarding panel interpretation, please contact Brian Poirier, M.D. at [facility] at [phone]. ------------------------------- DISCLAIMER These assessments and interpretations are provided as a convenience in support of the physician-patient relationship and are not intended to replace the physician's clinical judgment. They are derived from national guidelines in addition to other evidence and expert opinion. The clinician should consider this information within the context of clinical opinion and the individual patient. SEE GUIDANCE FOR VON WILLEBRAND FACTOR ASSESSMENT: (1) The National Heart, Lung and Blood Institute. The Diagnosis, Evaluation and Management of von Willebrand Disease. [place], [place]: National Institutes of Health Publicatio n 06-5831. 2007. Available at http://www.nhlbi.nih.gov/guidelines/vwd/. (2) Nichols WL et al. Am J Hematol. 2009; 84(6):366-370. (3) Laffan M et al. Haemophilia. 2004;10(3):199-217. (4) Pasi KJ et al. Haemophilia. 2004; 10(3):218-231. Performed At: [facility] [address] [place], [place] [id number] [place] John R MD Ph:[phone]   Hemoglobin and hematocrit, blood     Status: Abnormal   Collection Time: 04/12/17  4:05 AM  Result Value Ref Range   Hemoglobin 7.8 (L) 13.0 - 17.0 g/dL   HCT 23.9 (L) 39.0 - 52.0 %  Vitamin B12     Status: None   Collection Time: 04/12/17  9:23 AM  Result Value Ref Range   Vitamin B-12 346 180 - 914 pg/mL    Comment: (NOTE) This assay is not validated for testing neonatal or myeloproliferative syndrome specimens for Vitamin B12 levels. Performed at [facility], [address] [place], [place] [address]   Folate     Status: Abnormal   Collection Time: 04/12/17  9:23 AM  Result Value Ref Range   Folate 5.9 (L) >5.9 ng/mL    Comment: Performed at [facility], [address] [place], [place] [address]  Iron and TIBC     Status: Abnormal   Collection Time: 04/12/17  9:23 AM  Result Value Ref Range   Iron 13 (L) 45 - 182 ug/dL   TIBC 420 250 - 450 ug/dL   Saturation Ratios 3 (L) 17.9 - 39.5 %   UIBC 407 ug/dL    Comment: Performed at [facility], [address] [place], [place] [address]  Ferritin     Status: Abnormal   Collection Time: 04/12/17  9:23 AM  Result Value Ref Range   Ferritin 13 (L) 24 - 336 ng/mL    Comment: Performed at  [facility], [address] , [place] [address]  Reticulocytes     Status: Abnormal   Collection Time: 04/12/17  9:23 AM  Result Value Ref Range   Retic Ct Pct 2.2 0.4 - 3.1 %   RBC. 3.55 (L) 4.22 - 5.81 MIL/uL   Retic Count, Manual 78.1 19.0 - 186.0 K/uL  Hemoglobin and hematocrit, blood     Status: Abnormal   Collection Time: 04/12/17  3:49 PM  Result Value Ref Range   Hemoglobin 8.3 (L) 13.0 - 17.0 g/dL   HCT 25.6 (L) 39.0 - 52.0 %  CBC with Differential/Platelet     Status: Abnormal   Collection Time: 04/13/17  4:23 AM  Result Value Ref Range   WBC 4.8 4.0 - 10.5 K/uL   RBC 3.20 (L) 4.22 - 5.81 MIL/uL    Hemoglobin 7.5 (L) 13.0 - 17.0 g/dL   HCT 24.3 (L) 39.0 - 52.0 %   MCV 75.9 (L) 78.0 - 100.0 fL   MCH 23.4 (L) 26.0 - 34.0 pg   MCHC   30.9 30.0 - 36.0 g/dL   RDW 22.3 (H) 11.5 - 15.5 %   Platelets 285 150 - 400 K/uL   Neutrophils Relative % 68 %   Lymphocytes Relative 22 %   Monocytes Relative 7 %   Eosinophils Relative 3 %   Basophils Relative 0 %   Neutro Abs 3.3 1.7 - 7.7 K/uL   Lymphs Abs 1.1 0.7 - 4.0 K/uL   Monocytes Absolute 0.3 0.1 - 1.0 K/uL   Eosinophils Absolute 0.1 0.0 - 0.7 K/uL   Basophils Absolute 0.0 0.0 - 0.1 K/uL  Comprehensive metabolic panel     Status: Abnormal   Collection Time: 04/13/17  4:23 AM  Result Value Ref Range   Sodium 141 135 - 145 mmol/L   Potassium 3.2 (L) 3.5 - 5.1 mmol/L   Chloride 109 101 - 111 mmol/L   CO2 26 22 - 32 mmol/L   Glucose, Bld 113 (H) 65 - 99 mg/dL   BUN 5 (L) 6 - 20 mg/dL   Creatinine, Ser 1.00 0.61 - 1.24 mg/dL   Calcium 8.8 (L) 8.9 - 10.3 mg/dL   Total Protein 5.2 (L) 6.5 - 8.1 g/dL   Albumin 3.0 (L) 3.5 - 5.0 g/dL   AST 18 15 - 41 U/L   ALT 8 (L) 17 - 63 U/L   Alkaline Phosphatase 43 38 - 126 U/L   Total Bilirubin 0.6 0.3 - 1.2 mg/dL   GFR calc non Af Amer >60 >60 mL/min   GFR calc Af Amer >60 >60 mL/min    Comment: (NOTE) The eGFR has been calculated using the CKD EPI equation. This calculation has not been validated in all clinical situations. eGFR's persistently <60 mL/min signify possible Chronic Kidney Disease.    Anion gap 6 5 - 15    Ct Head Wo Contrast  Result Date: 04/12/2017 CLINICAL DATA:  55-year-old hypertensive male with visual loss right eye. No known injury. Initial encounter. EXAM: CT HEAD WITHOUT CONTRAST TECHNIQUE: Contiguous axial images were obtained from the base of the skull through the vertex without intravenous contrast. COMPARISON:  02/07/2017 CT. FINDINGS: Brain: No intracranial hemorrhage or CT evidence of large acute infarct. No intracranial mass lesion noted on this unenhanced exam.  Partially empty non expanded sella. Vascular: Vascular calcifications. Skull: No acute abnormality. Sinuses/Orbits: No acute orbital abnormality. Visualized paranasal sinuses are clear. Other: Visualized mastoid air cells and middle ear cavities are clear. IMPRESSION: No acute intracranial abnormality detected. Electronically Signed   By: Steven  Olson M.D.   On: 04/12/2017 13:19   Mr Mra Head Wo Contrast  Result Date: 04/12/2017 CLINICAL DATA:  RIGHT vision loss for week. Weakness. History of hypertension, smoker, hyperlipidemia. EXAM: MRI HEAD WITHOUT CONTRAST MRA HEAD WITHOUT CONTRAST TECHNIQUE: Multiplanar, multiecho pulse sequences of the brain and surrounding structures were obtained without intravenous contrast. Angiographic images of the head were obtained using MRA technique without contrast. COMPARISON:  CT HEAD Apr 12, 2017 at 1301 hours FINDINGS: MRI HEAD FINDINGS BRAIN: No reduced diffusion to suggest acute ischemia nor hyperacute demyelination. No susceptibility artifact to suggest hemorrhage. The ventricles and sulci are normal for patient's age. No suspicious parenchymal signal, masses or mass effect. No abnormal extra-axial fluid collections. VASCULAR: Normal major intracranial vascular flow voids present at skull base. SKULL AND UPPER CERVICAL SPINE: No abnormal sellar expansion. No suspicious calvarial bone marrow signal. Craniocervical junction maintained. SINUSES/ORBITS: Trace paranasal sinus mucosal thickening without air-fluid levels. Mastoid air cells are well aerated. The included ocular globes and   orbital contents are non-suspicious. OTHER: Patient is edentulous. MRA HEAD FINDINGS ANTERIOR CIRCULATION: Normal flow related enhancement of the included cervical, petrous, cavernous and supraclinoid internal carotid arteries. Patent anterior communicating artery. Normal flow related enhancement of the anterior and middle cerebral arteries, including distal segments. No large vessel  occlusion, high-grade stenosis, abnormal luminal irregularity, aneurysm. POSTERIOR CIRCULATION: Codominant vertebral artery's. Basilar artery is patent, with normal flow related enhancement of the main branch vessels. Normal flow related enhancement of the posterior cerebral arteries. No large vessel occlusion, high-grade stenosis, abnormal luminal irregularity, aneurysm. ANATOMIC VARIANTS: None. Source images and MIP images were reviewed. IMPRESSION: Negative noncontrast MRI head. Negative noncontrast MRA head. Electronically Signed   By: Courtnay  Bloomer M.D.   On: 04/12/2017 16:59   Mr Brain Wo Contrast  Result Date: 04/12/2017 CLINICAL DATA:  RIGHT vision loss for week. Weakness. History of hypertension, smoker, hyperlipidemia. EXAM: MRI HEAD WITHOUT CONTRAST MRA HEAD WITHOUT CONTRAST TECHNIQUE: Multiplanar, multiecho pulse sequences of the brain and surrounding structures were obtained without intravenous contrast. Angiographic images of the head were obtained using MRA technique without contrast. COMPARISON:  CT HEAD Apr 12, 2017 at 1301 hours FINDINGS: MRI HEAD FINDINGS BRAIN: No reduced diffusion to suggest acute ischemia nor hyperacute demyelination. No susceptibility artifact to suggest hemorrhage. The ventricles and sulci are normal for patient's age. No suspicious parenchymal signal, masses or mass effect. No abnormal extra-axial fluid collections. VASCULAR: Normal major intracranial vascular flow voids present at skull base. SKULL AND UPPER CERVICAL SPINE: No abnormal sellar expansion. No suspicious calvarial bone marrow signal. Craniocervical junction maintained. SINUSES/ORBITS: Trace paranasal sinus mucosal thickening without air-fluid levels. Mastoid air cells are well aerated. The included ocular globes and orbital contents are non-suspicious. OTHER: Patient is edentulous. MRA HEAD FINDINGS ANTERIOR CIRCULATION: Normal flow related enhancement of the included cervical, petrous, cavernous and  supraclinoid internal carotid arteries. Patent anterior communicating artery. Normal flow related enhancement of the anterior and middle cerebral arteries, including distal segments. No large vessel occlusion, high-grade stenosis, abnormal luminal irregularity, aneurysm. POSTERIOR CIRCULATION: Codominant vertebral artery's. Basilar artery is patent, with normal flow related enhancement of the main branch vessels. Normal flow related enhancement of the posterior cerebral arteries. No large vessel occlusion, high-grade stenosis, abnormal luminal irregularity, aneurysm. ANATOMIC VARIANTS: None. Source images and MIP images were reviewed. IMPRESSION: Negative noncontrast MRI head. Negative noncontrast MRA head. Electronically Signed   By: Courtnay  Bloomer M.D.   On: 04/12/2017 16:59   Nm Bowel Img Meckels  Result Date: 04/12/2017 CLINICAL DATA:  Gastrointestinal bleeding question Meckel's diverticulum EXAM: NUCLEAR MEDICINE MECKELS SCAN TECHNIQUE: Sequential abdominal images were obtained following intravenous injection of radiopharmaceutical. RADIOPHARMACEUTICALS:  9 millicuries Tc-99m pertechnetate IV COMPARISON:  GI bleeding study 04/07/2017 FINDINGS: Normal uptake of tracer within gastric mucosa. Blood pool distribution of tracer is initially visualized. Physiologic excretion of tracer by the kidneys into the urinary bladder, with minimally prominent RIGHT renal pelvis incidentally noted. No abnormal abdominal localization of pertechnetate is identified to suggest ectopic gastric mucosa and Meckel's diverticulum. IMPRESSION: Negative Meckel's scan. Electronically Signed   By: Beonca Gibb  Boles M.D.   On: 04/12/2017 12:16    ROS:  Pertinent items noted in HPI and remainder of comprehensive ROS otherwise negative.  Blood pressure (!) 113/46, pulse 73, temperature 97.4 F (36.3 C), temperature source Oral, resp. rate 19, height 5' 10" (1.778 m), weight 130 lb (59 kg), SpO2 98 %. Physical Exam: Pleasant  well-developed well-nourished white male in no acute distress. Head is normocephalic, atraumatic Neck   is supple without JVD Lungs clear auscultation with equal breath sounds bilaterally Heart examination reveals a regular rate and rhythm without S3, S4, murmurs. Abdomen is soft, nontender, nondistended. No hepatosplenomegaly or rigidity are noted. Rectal examination was deferred this time as colonoscopy report reviewed Oncology note reviewed, GI note reviewed  Assessment/Plan: Impression: Bleeding internal hemorrhoids, acute on chronic anemia plan: Will take patient to the OR tomorrow for extensive hemorrhoidectomy. The risks and benefits of the procedure including bleeding, infection, and the possible need for further surgery were fully explained to the patient, who gave informed consent. We will give 2 units packed red blood cells today in preparation for general anesthesia. Potassium will be supplemented.   Judiann Celia 04/13/2017, 8:41 AM     

## 2017-04-14 NOTE — Op Note (Signed)
Patient:  Jeffrey [name]  DOB:  1961-05-07  MRN:  [id number]   Preop Diagnosis:  Bleeding hemorrhoidal disease  Postop Diagnosis:  Same  Procedure:  Extensive hemorrhoidectomy  Surgeon:  Franky MachoMark Jennings Stirling, M.D.  Anes:  Gen.  Indications:  Patient is a 56 year old white male who presents with significant internal and external hemorrhoidal disease that may be causing acute on chronic anemia. He has had rectal bleeding with this. The risks and benefits of the procedure including bleeding, infection, and recurrence of the hemorrhoidal disease were fully explained to the patient, who gave informed consent.  Procedure note:  The patient was placed in the lithotomy position after general anesthesia was administered. The perineum was prepped and draped using the usual sterile technique with Betadine. Surgical site confirmation was performed.  On rectal examination, the patient had prominent internal and external hemorrhoids at the 2:00, 5:00, and 7:00 positions. These were the most prominent. They were not actively bleeding. I elected to proceed with internal and external hemorrhoidectomies in those regions in a columnlike fashion. This was performed using the LigaSure. Care was taken to avoid the external sphincter. The hemorrhoids were sent to pathology for examination. No narrowing of the anal canal was noted at the end of the procedure. Exparel was instilled into the surrounding peritoneum. The rectum was packed with Surgicel and viscous Xylocaine.  All tape and needle counts were correct at the end the procedure. The patient was awakened and transferred to PACU in stable condition.  Complications:  None  EBL:  Minimal  Specimen:  Hemorrhoids

## 2017-04-15 DIAGNOSIS — F3131 Bipolar disorder, current episode depressed, mild: Secondary | ICD-10-CM

## 2017-04-15 LAB — BPAM RBC
Blood Product Expiration Date: 201805242359
Blood Product Expiration Date: 201806072359
Blood Product Expiration Date: 201806072359
Blood Product Expiration Date: 201806112359
Blood Product Expiration Date: 201806232359
Blood Product Expiration Date: 201806232359
ISSUE DATE / TIME: 201805201029
ISSUE DATE / TIME: 201805221340
ISSUE DATE / TIME: 201805221651
Unit Type and Rh: 5100
Unit Type and Rh: 5100
Unit Type and Rh: 5100
Unit Type and Rh: 5100
Unit Type and Rh: 9500
Unit Type and Rh: 9500

## 2017-04-15 LAB — TYPE AND SCREEN
ABO/RH(D): O POS
Antibody Screen: NEGATIVE
Unit division: 0
Unit division: 0
Unit division: 0
Unit division: 0
Unit division: 0
Unit division: 0

## 2017-04-15 LAB — BASIC METABOLIC PANEL
Anion gap: 4 — ABNORMAL LOW (ref 5–15)
BUN: 6 mg/dL (ref 6–20)
CO2: 25 mmol/L (ref 22–32)
Calcium: 8.6 mg/dL — ABNORMAL LOW (ref 8.9–10.3)
Chloride: 108 mmol/L (ref 101–111)
Creatinine, Ser: 0.93 mg/dL (ref 0.61–1.24)
GFR calc Af Amer: >60 mL/min (ref >60)
GFR calc non Af Amer: >60 mL/min (ref >60)
Glucose, Bld: 97 mg/dL (ref 65–99)
Potassium: 4.3 mmol/L (ref 3.5–5.1)
Sodium: 137 mmol/L (ref 135–145)

## 2017-04-15 LAB — CBC
HCT: 26.4 % — ABNORMAL LOW (ref 39.0–52.0)
Hemoglobin: 8.7 g/dL — ABNORMAL LOW (ref 13.0–17.0)
MCH: 25.6 pg — ABNORMAL LOW (ref 26.0–34.0)
MCHC: 33 g/dL (ref 30.0–36.0)
MCV: 77.6 fL — ABNORMAL LOW (ref 78.0–100.0)
Platelets: 240 10*3/uL (ref 150–400)
RBC: 3.4 MIL/uL — ABNORMAL LOW (ref 4.22–5.81)
RDW: 21 % — ABNORMAL HIGH (ref 11.5–15.5)
WBC: 9.9 10*3/uL (ref 4.0–10.5)

## 2017-04-15 MED ORDER — CHLORHEXIDINE GLUCONATE CLOTH 2 % EX PADS: 6.0000 | MEDICATED_PAD | Freq: Every day | CUTANEOUS | Status: DC

## 2017-04-15 MED ORDER — OXYCODONE-ACETAMINOPHEN 7.5-325 MG PO TABS: 1.0000 | ORAL_TABLET | ORAL | 0 refills | Status: DC | PRN

## 2017-04-15 MED ORDER — MUPIROCIN 2 % EX OINT
1.0000 "application " | TOPICAL_OINTMENT | Freq: Two times a day (BID) | CUTANEOUS | Status: DC
  Filled 2017-04-15: qty 22

## 2017-04-15 NOTE — Progress Notes (Signed)
Start of shift, patient drowsy. States that he does not like how Dilaudid makes him feel "spacey". Patient states that he feels "wild" and would like to try Percocet next time. Patient repeatedly thinking it was morning last night, even after being reminded that it was night time. Patient needed both Percocet and Dilaudid through the night d/t pain. Patient slept off and on but was more alert when he was awake. Patient's orientation improved, but he was still very forgetful. Patient would forget things said or events minutes late. Patient repeatedly stated that he needed to urinate but couldn't. Patient did urinate x1 last night. Tiffany, NT bladder scanned him this AM with only 130 ml as result. Patient then stated that he did not need to urinate.

## 2017-04-15 NOTE — Progress Notes (Signed)
1 Day Post-Op  Subjective: Patient has done well since the surgery. He has had bowel movements with some blood in it, which is to be expected.  Objective: Vital signs in last 24 hours: Temp:  [97.5 F (36.4 C)-99 F (37.2 C)] 99 F (37.2 C) (05/24 0606) Pulse Rate:  [73-89] 88 (05/24 0606) Resp:  [15-33] 16 (05/24 0606) BP: (112-129)/(55-79) 112/55 (05/24 0606) SpO2:  [95 %-99 %] 96 % (05/24 0748) Last BM Date: 04/14/17 (Per patient)  Intake/Output from previous day: 05/23 0701 - 05/24 0700 In: 1540 [P.O.:840; I.V.:700] Out: 0  Intake/Output this shift: No intake/output data recorded.  General appearance: alert, cooperative and no distress  Lab Results:   Recent Labs  04/14/17 0535 04/14/17 1601 04/15/17 0613  WBC 5.1  --  9.9  HGB 9.6* 8.4* 8.7*  HCT 29.0* 25.6* 26.4*  PLT 274  --  240   BMET  Recent Labs  04/14/17 0535 04/15/17 0613  NA 143 137  K 4.1 4.3  CL 110 108  CO2 25 25  GLUCOSE 88 97  BUN 5* 6  CREATININE 0.95 0.93  CALCIUM 9.1 8.6*   PT/INR No results for input(s): LAB[facility], INR in the last 72 hours.  Studies/Results: No results found.  Anti-infectives: Anti-infectives    Start     Dose/Rate Route Frequency Ordered Stop   04/14/17 0837  metroNIDAZOLE (FLAGYL) IVPB 500 mg     500 mg 100 mL/hr over 60 Minutes Intravenous On call to O.R. 04/14/17 [id number] 04/14/17 0950      Assessment/Plan: s/p Procedure(s): HEMORRHOIDECTOMY Impression: Stable, status post hemorrhoidectomy. Hemoglobin is stable. Okay for discharge from surgery standpoint. We'll see patient again in 1 week for follow-up. Instructions given to patient.  LOS: 6 days    Franky MachoMark Brinlynn Gorton 04/15/2017

## 2017-04-15 NOTE — Care Management Important Message (Signed)
Important Message  Patient Details  Name: Jeffrey [name] MRN: [id number] Date of Birth: 09-Jun-1961   Medicare Important Message Given:  Yes    Ronita Hargreaves, Chrystine OilerSharley Diane, RN 04/15/2017, 8:45 AM

## 2017-04-15 NOTE — Discharge Summary (Addendum)
Physician Discharge Summary  Jeffrey [name] BJY:[id number] DOB: May 21, 1961 DOA: 04/07/2017  PCP: Barbie Banner, MD  Admit date: 04/07/2017 Discharge date: 04/15/2017  Admitted From: Home Disposition:  Home   Recommendations for Outpatient Follow-up:  1. Follow up with PCP in 1-2 weeks 2. Please obtain BMP/CBC in one week   Discharge Condition: Stable CODE STATUS:FULL Diet recommendation: Heart Healthy   Brief/Interim Summary: 56 y.o.malewith a past medical history significant for COPD, Bipolar and chronic rectal bleedingwho presents with rectal bleeding and abnormal labs.  The patient has chronic rectal bleeding, in the past this has been serious enough to require transfusion. Over the last few weeks, he has had bleedingfrom his rectum again. He describes this as "sitting on the toilet and blood drips out". He had labs drawn by GI two days ago that showed a 2-pt drop in Hgb. His bleeding persisted and today he talked to them and was recommended to go to the ER. He has had no hematemesis, melena. He takes baby aspirin, no other platelet agents or anticoagulants. He had a colonoscopy 1 week ago that showed hemorrhoids, was being prepped for a capsule study. In the ED patient was found to be Afebrile, heart rate 71, respirations and pulse ox normal, blood pressure 121/74. Na 135, K 4.0, Cr 1.01, WBC 6.7K,Hgb 8.9stable from 2 days prior to admission. The case was discussed with gastroenterology, who recommended a tagged red cell scan and TRH were asked to admit  Patient continued to have rectal bleeding and his H/H continued to drop. He underwent tagged red blood cell scan as well as capsule study both of which did not show cause of bleeding. Gastroenterology ordered PT/INR both of which were normal and ordered platelet assay which showed abnormalities normally seen in aspirin. Hematology consulted. Patient underwent a Meckel's scan secondary to continued GI bleeding. General  Surgery consulted. Plan for hemorrhoidectomy on 5/22.  Discharge Diagnoses:  Acute blood loss anemia from rectal bleeding: -workup thus far negative for active site of bleeding--neg NM RBC and neg Meckel's scan -04/11/2017 colonoscopy--all blood or fresh blood in the ileum or colon; nonbleeding large internal and external hemorrhoids - received 5 units PRBC total for the admission - 04/14/17--hemorrhoidectomy --Dr. Lovell Sheehan - General Surgery, Gastroenterology,Hematology consulted - hematology felt anemia was due to chronic blood loss, no evidence of primary hematologic issue - follow serial H/H--Hgb remained stable after hemorrhoidectomy  Bipolar disorder: -Continue Depakote, Zyprexa -Continue Prozac -Continue Klonopin  COPD: -Continue Dulera -Continue Spiriva -Albuterol PRN -stable on RA  Vision loss: - CT of head negative - negative MRI/ MRA - cannot give aspirin due to GIB   Discharge Instructions  Discharge Instructions    Ambulatory referral to Hematology    Complete by:  As directed      Allergies as of 04/15/2017      Reactions   Augmentin [amoxicillin-pot Clavulanate] Rash, Other (See Comments)   Has patient had a PCN reaction causing immediate rash, facial/tongue/throat swelling, SOB or lightheadedness with hypotension: No Has patient had a PCN reaction causing severe rash involving mucus membranes or skin necrosis: No Has patient had a PCN reaction that required hospitalization No Has patient had a PCN reaction occurring within the last 10 years: Yes If all of the above answers are "NO", then may proceed with Cephalosporin use.   Ace Inhibitors Hives      Medication List    STOP taking these medications   ferrous sulfate 325 (65 FE) MG tablet   polyethylene  glycol-electrolytes 420 g solution Commonly known as:  TRILYTE     TAKE these medications   acetaminophen 500 MG tablet Commonly known as:  TYLENOL Take 500-1,000 mg by mouth every 6  (six) hours as needed (FOR HEADACHES.).   albuterol 108 (90 Base) MCG/ACT inhaler Commonly known as:  PROAIR HFA Inhale 2 puffs into the lungs every 4 (four) hours as needed for wheezing or shortness of breath.   albuterol (2.5 MG/3ML) 0.083% nebulizer solution Commonly known as:  PROVENTIL Take 3 mLs (2.5 mg total) by nebulization every 4 (four) hours as needed for shortness of breath.   aspirin EC 81 MG tablet Take 81 mg by mouth every evening.   atorvastatin 20 MG tablet Commonly known as:  LIPITOR Take 20 mg by mouth daily.   budesonide-formoterol 160-4.5 MCG/ACT inhaler Commonly known as:  SYMBICORT Inhale 2 puffs into the lungs 2 (two) times daily.   clonazePAM 0.5 MG tablet Commonly known as:  KLONOPIN Take 1 tablet (0.5 mg total) by mouth 2 (two) times daily.   divalproex 250 MG DR tablet Commonly known as:  DEPAKOTE Take 3 tablets (750 mg total) by mouth at bedtime.   fenofibrate micronized 134 MG capsule Commonly known as:  LOFIBRA Take 134 mg by mouth daily before breakfast.   FLUoxetine 40 MG capsule Commonly known as:  PROZAC Take 1 capsule (40 mg total) by mouth every morning.   OLANZapine 5 MG tablet Commonly known as:  ZYPREXA Take 1 tablet (5 mg total) by mouth at bedtime.   pantoprazole 40 MG tablet Commonly known as:  PROTONIX Take 1 tablet (40 mg total) by mouth daily before breakfast.   SPIRIVA RESPIMAT 2.5 MCG/ACT Aers Generic drug:  Tiotropium Bromide Monohydrate Inhale 1 puff into the lungs daily.   temazepam 30 MG capsule Commonly known as:  RESTORIL Take 1 capsule (30 mg total) by mouth at bedtime as needed for sleep.      Follow-up Information    [name], MD Follow up.   Specialty:  General Surgery Why:  follow up on 04/22/17 Contact information: [address] [place] [id number] [phone]          Allergies  Allergen Reactions  . Augmentin [Amoxicillin-Pot Clavulanate] Rash and Other (See Comments)     Has patient had a PCN reaction causing immediate rash, facial/tongue/throat swelling, SOB or lightheadedness with hypotension: No Has patient had a PCN reaction causing severe rash involving mucus membranes or skin necrosis: No Has patient had a PCN reaction that required hospitalization No Has patient had a PCN reaction occurring within the last 10 years: Yes If all of the above answers are "NO", then may proceed with Cephalosporin use.  Donivan Scull Inhibitors Hives    Consultations:  GI, general surgery, hematology   Procedures/Studies: Ct Head Wo Contrast  Result Date: 04/12/2017 CLINICAL DATA:  56 year old hypertensive male with visual loss right eye. No known injury. Initial encounter. EXAM: CT HEAD WITHOUT CONTRAST TECHNIQUE: Contiguous axial images were obtained from the base of the skull through the vertex without intravenous contrast. COMPARISON:  02/07/2017 CT. FINDINGS: Brain: No intracranial hemorrhage or CT evidence of large acute infarct. No intracranial mass lesion noted on this unenhanced exam. Partially empty non expanded sella. Vascular: Vascular calcifications. Skull: No acute abnormality. Sinuses/Orbits: No acute orbital abnormality. Visualized paranasal sinuses are clear. Other: Visualized mastoid air cells and middle ear cavities are clear. IMPRESSION: No acute intracranial abnormality detected. Electronically Signed   By: Ernie Hew.D.  On: 04/12/2017 13:19   Mr Maxine Glenn Head Wo Contrast  Result Date: 04/12/2017 CLINICAL DATA:  RIGHT vision loss for week. Weakness. History of hypertension, smoker, hyperlipidemia. EXAM: MRI HEAD WITHOUT CONTRAST MRA HEAD WITHOUT CONTRAST TECHNIQUE: Multiplanar, multiecho pulse sequences of the brain and surrounding structures were obtained without intravenous contrast. Angiographic images of the head were obtained using MRA technique without contrast. COMPARISON:  CT HEAD Apr 12, 2017 at 1301 hours FINDINGS: MRI HEAD FINDINGS BRAIN: No reduced  diffusion to suggest acute ischemia nor hyperacute demyelination. No susceptibility artifact to suggest hemorrhage. The ventricles and sulci are normal for patient's age. No suspicious parenchymal signal, masses or mass effect. No abnormal extra-axial fluid collections. VASCULAR: Normal major intracranial vascular flow voids present at skull base. SKULL AND UPPER CERVICAL SPINE: No abnormal sellar expansion. No suspicious calvarial bone marrow signal. Craniocervical junction maintained. SINUSES/ORBITS: Trace paranasal sinus mucosal thickening without air-fluid levels. Mastoid air cells are well aerated. The included ocular globes and orbital contents are non-suspicious. OTHER: Patient is edentulous. MRA HEAD FINDINGS ANTERIOR CIRCULATION: Normal flow related enhancement of the included cervical, petrous, cavernous and supraclinoid internal carotid arteries. Patent anterior communicating artery. Normal flow related enhancement of the anterior and middle cerebral arteries, including distal segments. No large vessel occlusion, high-grade stenosis, abnormal luminal irregularity, aneurysm. POSTERIOR CIRCULATION: Codominant vertebral artery's. Basilar artery is patent, with normal flow related enhancement of the main branch vessels. Normal flow related enhancement of the posterior cerebral arteries. No large vessel occlusion, high-grade stenosis, abnormal luminal irregularity, aneurysm. ANATOMIC VARIANTS: None. Source images and MIP images were reviewed. IMPRESSION: Negative noncontrast MRI head. Negative noncontrast MRA head. Electronically Signed   By: Awilda Metro M.D.   On: 04/12/2017 16:59   Mr Brain Wo Contrast  Result Date: 04/12/2017 CLINICAL DATA:  RIGHT vision loss for week. Weakness. History of hypertension, smoker, hyperlipidemia. EXAM: MRI HEAD WITHOUT CONTRAST MRA HEAD WITHOUT CONTRAST TECHNIQUE: Multiplanar, multiecho pulse sequences of the brain and surrounding structures were obtained without  intravenous contrast. Angiographic images of the head were obtained using MRA technique without contrast. COMPARISON:  CT HEAD Apr 12, 2017 at 1301 hours FINDINGS: MRI HEAD FINDINGS BRAIN: No reduced diffusion to suggest acute ischemia nor hyperacute demyelination. No susceptibility artifact to suggest hemorrhage. The ventricles and sulci are normal for patient's age. No suspicious parenchymal signal, masses or mass effect. No abnormal extra-axial fluid collections. VASCULAR: Normal major intracranial vascular flow voids present at skull base. SKULL AND UPPER CERVICAL SPINE: No abnormal sellar expansion. No suspicious calvarial bone marrow signal. Craniocervical junction maintained. SINUSES/ORBITS: Trace paranasal sinus mucosal thickening without air-fluid levels. Mastoid air cells are well aerated. The included ocular globes and orbital contents are non-suspicious. OTHER: Patient is edentulous. MRA HEAD FINDINGS ANTERIOR CIRCULATION: Normal flow related enhancement of the included cervical, petrous, cavernous and supraclinoid internal carotid arteries. Patent anterior communicating artery. Normal flow related enhancement of the anterior and middle cerebral arteries, including distal segments. No large vessel occlusion, high-grade stenosis, abnormal luminal irregularity, aneurysm. POSTERIOR CIRCULATION: Codominant vertebral artery's. Basilar artery is patent, with normal flow related enhancement of the main branch vessels. Normal flow related enhancement of the posterior cerebral arteries. No large vessel occlusion, high-grade stenosis, abnormal luminal irregularity, aneurysm. ANATOMIC VARIANTS: None. Source images and MIP images were reviewed. IMPRESSION: Negative noncontrast MRI head. Negative noncontrast MRA head. Electronically Signed   By: Awilda Metro M.D.   On: 04/12/2017 16:59   Nm Gi Blood Loss  Result Date: 04/07/2017 CLINICAL DATA:  GI  bleed. Gastrointestinal hemorrhage associated with other  gastritis. EXAM: NUCLEAR MEDICINE GASTROINTESTINAL BLEEDING SCAN TECHNIQUE: Sequential abdominal images were obtained following intravenous administration of Tc-36m labeled red blood cells. Imaging obtained for 2 hours. RADIOPHARMACEUTICALS:  22 mCi Tc-31m in-vitro labeled red cells. COMPARISON:  None. FINDINGS: There no scintigraphic findings to localize source of GI bleed. Central pelvic activity does not course along the course of the GI tract is likely secondary to bladder activity. Peroneal/ penile activity also noted. IMPRESSION: No scintigraphic localization of GI bleed. Electronically Signed   By: Rubye Oaks M.D.   On: 04/07/2017 23:11   Nm Bowel Img Meckels  Result Date: 04/12/2017 CLINICAL DATA:  Gastrointestinal bleeding question Meckel's diverticulum EXAM: NUCLEAR MEDICINE MECKELS SCAN TECHNIQUE: Sequential abdominal images were obtained following intravenous injection of radiopharmaceutical. RADIOPHARMACEUTICALS:  9 millicuries Tc-64m pertechnetate IV COMPARISON:  GI bleeding study 04/07/2017 FINDINGS: Normal uptake of tracer within gastric mucosa. Blood pool distribution of tracer is initially visualized. Physiologic excretion of tracer by the kidneys into the urinary bladder, with minimally prominent RIGHT renal pelvis incidentally noted. No abnormal abdominal localization of pertechnetate is identified to suggest ectopic gastric mucosa and Meckel's diverticulum. IMPRESSION: Negative Meckel's scan. Electronically Signed   By: Ulyses Southward M.D.   On: 04/12/2017 12:16        Discharge Exam: Vitals:   04/14/17 2138 04/15/17 0606  BP: 127/72 (!) 112/55  Pulse: 75 88  Resp: 16 16  Temp: 97.5 F (36.4 C) 99 F (37.2 C)   Vitals:   04/14/17 2138 04/15/17 0606 04/15/17 0747 04/15/17 0748  BP: 127/72 (!) 112/55    Pulse: 75 88    Resp: 16 16    Temp: 97.5 F (36.4 C) 99 F (37.2 C)    TempSrc: Oral Oral    SpO2: 96% 98% 96% 96%  Weight:      Height:        General: Pt is  alert, awake, not in acute distress Cardiovascular: RRR, S1/S2 +, no rubs, no gallops Respiratory: CTA bilaterally, no wheezing, no rhonchi Abdominal: Soft, NT, ND, bowel sounds + Extremities: no edema, no cyanosis   The results of significant diagnostics from this hospitalization (including imaging, microbiology, ancillary and laboratory) are listed below for reference.    Significant Diagnostic Studies: Ct Head Wo Contrast  Result Date: 04/12/2017 CLINICAL DATA:  56 year old hypertensive male with visual loss right eye. No known injury. Initial encounter. EXAM: CT HEAD WITHOUT CONTRAST TECHNIQUE: Contiguous axial images were obtained from the base of the skull through the vertex without intravenous contrast. COMPARISON:  02/07/2017 CT. FINDINGS: Brain: No intracranial hemorrhage or CT evidence of large acute infarct. No intracranial mass lesion noted on this unenhanced exam. Partially empty non expanded sella. Vascular: Vascular calcifications. Skull: No acute abnormality. Sinuses/Orbits: No acute orbital abnormality. Visualized paranasal sinuses are clear. Other: Visualized mastoid air cells and middle ear cavities are clear. IMPRESSION: No acute intracranial abnormality detected. Electronically Signed   By: Lacy Duverney M.D.   On: 04/12/2017 13:19   Mr Maxine Glenn Head Wo Contrast  Result Date: 04/12/2017 CLINICAL DATA:  RIGHT vision loss for week. Weakness. History of hypertension, smoker, hyperlipidemia. EXAM: MRI HEAD WITHOUT CONTRAST MRA HEAD WITHOUT CONTRAST TECHNIQUE: Multiplanar, multiecho pulse sequences of the brain and surrounding structures were obtained without intravenous contrast. Angiographic images of the head were obtained using MRA technique without contrast. COMPARISON:  CT HEAD Apr 12, 2017 at 1301 hours FINDINGS: MRI HEAD FINDINGS BRAIN: No reduced diffusion to suggest acute  ischemia nor hyperacute demyelination. No susceptibility artifact to suggest hemorrhage. The ventricles and  sulci are normal for patient's age. No suspicious parenchymal signal, masses or mass effect. No abnormal extra-axial fluid collections. VASCULAR: Normal major intracranial vascular flow voids present at skull base. SKULL AND UPPER CERVICAL SPINE: No abnormal sellar expansion. No suspicious calvarial bone marrow signal. Craniocervical junction maintained. SINUSES/ORBITS: Trace paranasal sinus mucosal thickening without air-fluid levels. Mastoid air cells are well aerated. The included ocular globes and orbital contents are non-suspicious. OTHER: Patient is edentulous. MRA HEAD FINDINGS ANTERIOR CIRCULATION: Normal flow related enhancement of the included cervical, petrous, cavernous and supraclinoid internal carotid arteries. Patent anterior communicating artery. Normal flow related enhancement of the anterior and middle cerebral arteries, including distal segments. No large vessel occlusion, high-grade stenosis, abnormal luminal irregularity, aneurysm. POSTERIOR CIRCULATION: Codominant vertebral artery's. Basilar artery is patent, with normal flow related enhancement of the main branch vessels. Normal flow related enhancement of the posterior cerebral arteries. No large vessel occlusion, high-grade stenosis, abnormal luminal irregularity, aneurysm. ANATOMIC VARIANTS: None. Source images and MIP images were reviewed. IMPRESSION: Negative noncontrast MRI head. Negative noncontrast MRA head. Electronically Signed   By: Awilda Metro M.D.   On: 04/12/2017 16:59   Mr Brain Wo Contrast  Result Date: 04/12/2017 CLINICAL DATA:  RIGHT vision loss for week. Weakness. History of hypertension, smoker, hyperlipidemia. EXAM: MRI HEAD WITHOUT CONTRAST MRA HEAD WITHOUT CONTRAST TECHNIQUE: Multiplanar, multiecho pulse sequences of the brain and surrounding structures were obtained without intravenous contrast. Angiographic images of the head were obtained using MRA technique without contrast. COMPARISON:  CT HEAD Apr 12, 2017 at 1301 hours FINDINGS: MRI HEAD FINDINGS BRAIN: No reduced diffusion to suggest acute ischemia nor hyperacute demyelination. No susceptibility artifact to suggest hemorrhage. The ventricles and sulci are normal for patient's age. No suspicious parenchymal signal, masses or mass effect. No abnormal extra-axial fluid collections. VASCULAR: Normal major intracranial vascular flow voids present at skull base. SKULL AND UPPER CERVICAL SPINE: No abnormal sellar expansion. No suspicious calvarial bone marrow signal. Craniocervical junction maintained. SINUSES/ORBITS: Trace paranasal sinus mucosal thickening without air-fluid levels. Mastoid air cells are well aerated. The included ocular globes and orbital contents are non-suspicious. OTHER: Patient is edentulous. MRA HEAD FINDINGS ANTERIOR CIRCULATION: Normal flow related enhancement of the included cervical, petrous, cavernous and supraclinoid internal carotid arteries. Patent anterior communicating artery. Normal flow related enhancement of the anterior and middle cerebral arteries, including distal segments. No large vessel occlusion, high-grade stenosis, abnormal luminal irregularity, aneurysm. POSTERIOR CIRCULATION: Codominant vertebral artery's. Basilar artery is patent, with normal flow related enhancement of the main branch vessels. Normal flow related enhancement of the posterior cerebral arteries. No large vessel occlusion, high-grade stenosis, abnormal luminal irregularity, aneurysm. ANATOMIC VARIANTS: None. Source images and MIP images were reviewed. IMPRESSION: Negative noncontrast MRI head. Negative noncontrast MRA head. Electronically Signed   By: Awilda Metro M.D.   On: 04/12/2017 16:59   Nm Gi Blood Loss  Result Date: 04/07/2017 CLINICAL DATA:  GI bleed. Gastrointestinal hemorrhage associated with other gastritis. EXAM: NUCLEAR MEDICINE GASTROINTESTINAL BLEEDING SCAN TECHNIQUE: Sequential abdominal images were obtained following intravenous  administration of Tc-85m labeled red blood cells. Imaging obtained for 2 hours. RADIOPHARMACEUTICALS:  22 mCi Tc-62m in-vitro labeled red cells. COMPARISON:  None. FINDINGS: There no scintigraphic findings to localize source of GI bleed. Central pelvic activity does not course along the course of the GI tract is likely secondary to bladder activity. Peroneal/ penile activity also noted. IMPRESSION: No scintigraphic localization of  GI bleed. Electronically Signed   By: Rubye Oaks M.D.   On: 04/07/2017 23:11   Nm Bowel Img Meckels  Result Date: 04/12/2017 CLINICAL DATA:  Gastrointestinal bleeding question Meckel's diverticulum EXAM: NUCLEAR MEDICINE MECKELS SCAN TECHNIQUE: Sequential abdominal images were obtained following intravenous injection of radiopharmaceutical. RADIOPHARMACEUTICALS:  9 millicuries Tc-66m pertechnetate IV COMPARISON:  GI bleeding study 04/07/2017 FINDINGS: Normal uptake of tracer within gastric mucosa. Blood pool distribution of tracer is initially visualized. Physiologic excretion of tracer by the kidneys into the urinary bladder, with minimally prominent RIGHT renal pelvis incidentally noted. No abnormal abdominal localization of pertechnetate is identified to suggest ectopic gastric mucosa and Meckel's diverticulum. IMPRESSION: Negative Meckel's scan. Electronically Signed   By: Ulyses Southward M.D.   On: 04/12/2017 12:16     Microbiology: Recent Results (from the past 240 hour(s))  Surgical PCR screen     Status: Abnormal   Collection Time: 04/14/17  3:44 AM  Result Value Ref Range Status   MRSA, PCR NEGATIVE NEGATIVE Final   Staphylococcus aureus POSITIVE (A) NEGATIVE Final    Comment:        The Xpert SA Assay (FDA approved for NASAL specimens in patients over 59 years of age), is one component of a comprehensive surveillance program.  Test performance has been validated by [facility] for patients greater than or equal to 63 year old. It is not intended to  diagnose infection nor to guide or monitor treatment. RESULT CALLED TO, READ BACK BY AND VERIFIED WITH: WATKINS,T@840  BY MATTHEWS,B 04/14/17      Labs: Basic Metabolic Panel:  Recent Labs Lab 04/13/17 0423 04/14/17 0535 04/15/17 0613  NA 141 143 137  K 3.2* 4.1 4.3  CL 109 110 108  CO2 26 25 25   GLUCOSE 113* 88 97  BUN 5* 5* 6  CREATININE 1.00 0.95 0.93  CALCIUM 8.8* 9.1 8.6*   Liver Function Tests:  Recent Labs Lab 04/13/17 0423  AST 18  ALT 8*  ALKPHOS 43  BILITOT 0.6  PROT 5.2*  ALBUMIN 3.0*   No results for input(s): LIPASE, AMYLASE in the last 168 hours. No results for input(s): AMMONIA in the last 168 hours. CBC:  Recent Labs Lab 04/13/17 0423 04/13/17 1307 04/13/17 2150 04/14/17 0535 04/14/17 1601 04/15/17 0613  WBC 4.8  --   --  5.1  --  9.9  NEUTROABS 3.3  --   --   --   --   --   HGB 7.5* 6.9* 9.5* 9.6* 8.4* 8.7*  HCT 24.3* 21.6* 28.7* 29.0* 25.6* 26.4*  MCV 75.9*  --   --  76.5*  --  77.6*  PLT 285  --   --  274  --  240   Cardiac Enzymes: No results for input(s): CKTOTAL, CKMB, CKMBINDEX, TROPONINI in the last 168 hours. BNP: Invalid input(s): POCBNP CBG: No results for input(s): GLUCAP in the last 168 hours.  Time coordinating discharge:  Greater than 30 minutes  Signed:  Maclain Cohron, DO [facility] Pager: [phone] 04/15/2017, 8:35 AM

## 2017-04-15 NOTE — Anesthesia Postprocedure Evaluation (Signed)
Anesthesia Post Note  Patient: Jeffrey [name]  Procedure(s) Performed: Procedure(s) (LRB): HEMORRHOIDECTOMY (N/A)  Patient location during evaluation: PACU Anesthesia Type: General Level of consciousness: awake and alert Pain management: pain level controlled Vital Signs Assessment: post-procedure vital signs reviewed and stable Respiratory status: spontaneous breathing Cardiovascular status: stable Anesthetic complications: no Comments: Late entry 04/15/2017 1043  T. Faiga Stones CRNA     Last Vitals:  Vitals:   04/14/17 2138 04/15/17 0606  BP: 127/72 (!) 112/55  Pulse: 75 88  Resp: 16 16  Temp: 36.4 C 37.2 C    Last Pain:  Vitals:   04/15/17 0948  TempSrc:   PainSc: 0-No pain                 Toris Laverdiere

## 2017-04-15 NOTE — Care Management Note (Signed)
Case Management Note  Patient Details  Name: Jeffrey [name] MRN: [id number] Date of Birth: 09/19/1961     Expected Discharge Date:  04/15/17               Expected Discharge Plan:  Home/Self Care  In-House Referral:  NA  Discharge planning Services  CM Consult  Post Acute Care Choice:  NA Choice offered to:  NA  DME Arranged:    DME Agency:     HH Arranged:    HH Agency:     Status of Service:  In process, will continue to follow  If discussed at Long Length of Stay Meetings, dates discussed:    Additional Comments: Patient discharging home today. No CM needs.  July Linam, Chrystine OilerSharley Diane, RN 04/15/2017, 8:46 AM

## 2017-04-15 NOTE — Discharge Instructions (Signed)
Surgical Procedures for Hemorrhoids, Care After °Refer to this sheet in the next few weeks. These instructions provide you with information about caring for yourself after your procedure. Your health care provider may also give you more specific instructions. Your treatment has been planned according to current medical practices, but problems sometimes occur. Call your health care provider if you have any problems or questions after your procedure. °What can I expect after the procedure? °After the procedure, it is common to have: °· Rectal pain. °· Pain when you are having a bowel movement. °· Slight rectal bleeding. °Follow these instructions at home: °Medicines  °· Take over-the-counter and prescription medicines only as told by your health care provider. °· Do not drive or operate heavy machinery while taking prescription pain medicine. °· Use a stool softener or a bulk laxative as told by your health care provider. °Activity  °· Rest at home. Return to your normal activities as told by your health care provider. °· Do not lift anything that is heavier than 10 lb (4.5 kg). °· Do not sit for long periods of time. Take a walk every day or as told by your health care provider. °· Do not strain to have a bowel movement. Do not spend a long time sitting on the toilet. °Eating and drinking  °· Eat foods that contain fiber, such as whole grains, beans, nuts, fruits, and vegetables. °· Drink enough fluid to keep your urine clear or pale yellow. °General instructions  °· Sit in a warm bath 2-3 times per day to relieve soreness or itching. °· Keep all follow-up visits as told by your health care provider. This is important. °Contact a health care provider if: °· Your pain medicine is not helping. °· You have a fever or chills. °· You become constipated. °· You have trouble passing urine. °Get help right away if: °· You have very bad rectal pain. °· You have heavy bleeding from your rectum. °This information is not  intended to replace advice given to you by your health care provider. Make sure you discuss any questions you have with your health care provider. °Document Released: 01/30/2004 Document Revised: 04/16/2016 Document Reviewed: 02/04/2015 °Elsevier Interactive Patient Education © 2017 [employer] ° °

## 2017-04-15 NOTE — Progress Notes (Signed)
Patient discharged home.  IV removed - WNL.  Reviewed DC instructions and medications.  Hand outs given and reviewed on post hemorrhoidectomy care.  Verbalizes understanding. No questions at this time. Patient in NAD.

## 2017-04-16 ENCOUNTER — Encounter (HOSPITAL_COMMUNITY): Payer: Self-pay | Admitting: General Surgery

## 2017-04-20 ENCOUNTER — Encounter: Payer: Medicare Other | Admitting: Internal Medicine

## 2017-04-27 ENCOUNTER — Telehealth: Payer: Self-pay | Admitting: Internal Medicine

## 2017-04-27 ENCOUNTER — Encounter: Payer: Self-pay | Admitting: Internal Medicine

## 2017-04-27 NOTE — Telephone Encounter (Signed)
Patient called wanting to know when his next appt was.  Please advise when he needs to return.

## 2017-04-27 NOTE — Telephone Encounter (Signed)
APPT MADE

## 2017-04-27 NOTE — Telephone Encounter (Signed)
Jeffrey [name], when does the pt need to follow up with us?

## 2017-04-27 NOTE — Telephone Encounter (Signed)
6 weeks

## 2017-04-27 NOTE — Telephone Encounter (Signed)
Routing to Stacey  

## 2017-04-29 ENCOUNTER — Encounter: Payer: Self-pay | Admitting: General Surgery

## 2017-04-29 ENCOUNTER — Ambulatory Visit (INDEPENDENT_AMBULATORY_CARE_PROVIDER_SITE_OTHER): Payer: Self-pay | Admitting: General Surgery

## 2017-04-29 VITALS — BP 130/61 | HR 57 | Temp 97.3°F | Resp 18 | Ht 70.0 in | Wt 145.0 lb

## 2017-04-29 DIAGNOSIS — Z09 Encounter for follow-up examination after completed treatment for conditions other than malignant neoplasm: Secondary | ICD-10-CM

## 2017-04-29 NOTE — Progress Notes (Signed)
Subjective:     Jeffrey GriffonEdward T [name]  Status post extensive hemorrhoidectomy for rectal bleeding. Patient states he is doing well. The bleeding has for the most part stopped. He denies any lightheadedness. No constipation noted. No rectal pain noted. Objective:    BP 130/61   Pulse (!) 57   Temp 97.3 F (36.3 C)   Resp 18   Ht 5\' 10"  (1.778 m)   Wt 145 lb (65.8 kg)   BMI 20.81 kg/m   General:  alert, cooperative and no distress  Rectum healing well. No blood noted. Final pathology consistent with diagnosis.     Assessment:    Doing well postoperatively.    Plan:  May resume normal activity. Follow-up here as needed.

## 2017-05-13 ENCOUNTER — Encounter: Payer: Self-pay | Admitting: Pulmonary Disease

## 2017-05-13 ENCOUNTER — Ambulatory Visit (INDEPENDENT_AMBULATORY_CARE_PROVIDER_SITE_OTHER): Payer: Medicare Other | Admitting: Pulmonary Disease

## 2017-05-13 VITALS — BP 120/72 | HR 62 | Ht 70.0 in | Wt 138.4 lb

## 2017-05-13 DIAGNOSIS — F17200 Nicotine dependence, unspecified, uncomplicated: Secondary | ICD-10-CM

## 2017-05-13 DIAGNOSIS — F17210 Nicotine dependence, cigarettes, uncomplicated: Secondary | ICD-10-CM | POA: Diagnosis not present

## 2017-05-13 DIAGNOSIS — J449 Chronic obstructive pulmonary disease, unspecified: Secondary | ICD-10-CM | POA: Diagnosis not present

## 2017-05-13 MED ORDER — FLUTICASONE-UMECLIDIN-VILANT 100-62.5-25 MCG/INH IN AEPB: 1.0000 | INHALATION_SPRAY | Freq: Every day | RESPIRATORY_TRACT | 0 refills | Status: DC

## 2017-05-13 NOTE — Patient Instructions (Signed)
   Keep working on cutting back on your cigarettes.  Check with your pharmacist to see if this new inhaler we are giving you today will be cheaper than the Symbicort & Spiriva.  Use the Trelegy inhaler we are giving you today by doing one inhalation once daily. Remember to remove any dentures or partials you have before you use your inhaler. Remember to brush your teeth & tongue after you use your inhaler as well as rinse, gargle & spit to keep from getting thrush in your mouth or on your tongue (a white film).   I will see you back in 6 months or sooner if needed.

## 2017-05-13 NOTE — Progress Notes (Signed)
Patient seen in the office today and instructed on use of Trelegy.  Patient expressed understanding and demonstrated technique.  

## 2017-05-13 NOTE — Progress Notes (Signed)
Subjective:    Patient ID: Jeffrey [name], male    DOB: 1961/11/07, 56 y.o.   MRN: [id number]  C.C.:  Follow-up for Severe COPD & Tobacco Use Disorder.  HPI Since last appointment patient was hospitalized with blood loss. He required hemorrhoidectomy.   Severe COPD: Patient prescribed Symbicort & Spiriva however has been unable to afford the medications recently. He is using his rescue inhaler once daily. He has continued to have an intermittent cough producing a clear mucus. No wheezing.   Tobacco use disorder: Previously intolerant of Chantix. Again recommended nicotine replacement at last appointment. At last appointment patient was still smoking one half pack per day of cigarettes. He is still smoking 1/2ppd but attributes this to using the 21mg  patches daily.   Review of Systems No chest pain or tightness. No fever or chills. No abdominal pain or nausea.   Allergies  Allergen Reactions  . Augmentin [Amoxicillin-Pot Clavulanate] Rash and Other (See Comments)    Has patient had a PCN reaction causing immediate rash, facial/tongue/throat swelling, SOB or lightheadedness with hypotension: No Has patient had a PCN reaction causing severe rash involving mucus membranes or skin necrosis: No Has patient had a PCN reaction that required hospitalization No Has patient had a PCN reaction occurring within the last 10 years: Yes If all of the above answers are "NO", then may proceed with Cephalosporin use.  Donivan Scull Inhibitors Hives    Current Outpatient Prescriptions on File Prior to Visit  Medication Sig Dispense Refill  . acetaminophen (TYLENOL) 500 MG tablet Take 500-1,000 mg by mouth every 6 (six) hours as needed (FOR HEADACHES.).    Marland Kitchen albuterol (PROAIR HFA) 108 (90 Base) MCG/ACT inhaler Inhale 2 puffs into the lungs every 4 (four) hours as needed for wheezing or shortness of breath. 1 Inhaler 3  . albuterol (PROVENTIL) (2.5 MG/3ML) 0.083% nebulizer solution Take 3 mLs (2.5 mg total) by  nebulization every 4 (four) hours as needed for shortness of breath. 120 mL 3  . aspirin EC 81 MG tablet Take 81 mg by mouth every evening.     Marland Kitchen atorvastatin (LIPITOR) 20 MG tablet Take 20 mg by mouth daily.     . clonazePAM (KLONOPIN) 0.5 MG tablet Take 1 tablet (0.5 mg total) by mouth 2 (two) times daily. 60 tablet 2  . divalproex (DEPAKOTE) 250 MG DR tablet Take 3 tablets (750 mg total) by mouth at bedtime. 270 tablet 2  . fenofibrate micronized (LOFIBRA) 134 MG capsule Take 134 mg by mouth daily before breakfast.     . FLUoxetine (PROZAC) 40 MG capsule Take 1 capsule (40 mg total) by mouth every morning. 90 capsule 2  . OLANZapine (ZYPREXA) 5 MG tablet Take 1 tablet (5 mg total) by mouth at bedtime. 90 tablet 2  . pantoprazole (PROTONIX) 40 MG tablet Take 1 tablet (40 mg total) by mouth daily before breakfast. 30 tablet 5  . temazepam (RESTORIL) 30 MG capsule Take 1 capsule (30 mg total) by mouth at bedtime as needed for sleep. 30 capsule 2  . budesonide-formoterol (SYMBICORT) 160-4.5 MCG/ACT inhaler Inhale 2 puffs into the lungs 2 (two) times daily. (Patient not taking: Reported on 05/13/2017) 10.2 g 0  . oxyCODONE-acetaminophen (PERCOCET) 7.5-325 MG tablet Take 1-2 tablets by mouth every 4 (four) hours as needed. (Patient not taking: Reported on 05/13/2017) 60 tablet 0  . Tiotropium Bromide Monohydrate (SPIRIVA RESPIMAT) 2.5 MCG/ACT AERS Inhale 1 puff into the lungs daily.     No current  facility-administered medications on file prior to visit.     Past Medical History:  Diagnosis Date  . Acute blood loss anemia 02/07/2017  . Anxiety   . Arthritis    deg disease, bulging disk,  shoulder level  . Bipolar disorder (HCC)   . Bipolar disorder (HCC)   . 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 Depression    anxiety  . Hyperlipidemia   . Hypertension   . Peptic ulcer disease    Review  . Pneumonia   . [name] (peripheral vascular disease) (HCC) 06/18/2015  . Shortness of breath     Past  Surgical History:  Procedure Laterality Date  . BIOPSY  02/09/2017   Procedure: BIOPSY;  Surgeon: [name], MD;  Location: [facility];  Service: Endoscopy;;  duodenal gastric  . COLONOSCOPY  03/14/2007   [id number] colonoscopy and terminal ileoscopy except external hemorrhoids  . COLONOSCOPY N/A 05/05/2013   [name]: external/internal anal canal hemorrhoids, unable to intubate TI, segemental biopsies unremarkable   . COLONOSCOPY N/A 04/11/2017   Procedure: COLONOSCOPY;  Surgeon: [name], MD;  Location: [facility];  Service: Endoscopy;  Laterality: N/A;  . COLONOSCOPY WITH PROPOFOL N/A 03/31/2017   Procedure: COLONOSCOPY WITH PROPOFOL;  Surgeon: [name], MD;  Location: [facility];  Service: Endoscopy;  Laterality: N/A;  1:45pm  . ESOPHAGOGASTRODUODENOSCOPY  03/14/2007   [id number] antral gastritis with bulbar duodenitis/paucity to postbulbar duodenal folds and biopsy were benign with no evidence of villous atrophy.  . ESOPHAGOGASTRODUODENOSCOPY (EGD) WITH PROPOFOL N/A 02/09/2017   Procedure: ESOPHAGOGASTRODUODENOSCOPY (EGD) WITH PROPOFOL;  Surgeon: [name], MD;  Location: [facility];  Service: Endoscopy;  Laterality: N/A;  . GIVENS CAPSULE STUDY N/A 04/08/2017   Procedure: GIVENS CAPSULE STUDY;  Surgeon: [name], MD;  Location: [facility];  Service: Endoscopy;  Laterality: N/A;  . HAND SURGERY     left, secondary to self-inflicted laceration  . HEMORRHOID SURGERY N/A 04/14/2017   Procedure: HEMORRHOIDECTOMY;  Surgeon: [name], MD;  Location: [facility];  Service: General;  Laterality: N/A;  . SHOULDER SURGERY     right  . TOE SURGERY     left great toe , amputated-lawnmover accident    Family History  Problem Relation Age of Onset  . Breast cancer Mother        deceased  . Heart disease Father   . Depression Daughter   . Anxiety disorder Daughter   . Anxiety disorder Son   . Depression Son   . Asthma Brother   . Heart attack  Maternal Aunt   . Heart attack Maternal Uncle   . Heart attack Paternal Aunt   . Heart attack Paternal Uncle   . Heart attack Maternal Grandmother   . Heart attack Maternal Grandfather   . Emphysema Maternal Grandfather   . Heart attack Paternal Grandmother   . Heart attack Paternal Grandfather   . Colon cancer Neg Hx   . Liver disease Neg Hx     Social History   Social History  . Marital status: Divorced    Spouse name: N/A  . Number of children: 2  . Years of education: N/A   Occupational History  . Disabled    Social History Main Topics  . Smoking status: Current Every Day Smoker    Packs/day: 0.50    Years: 40.00    Types: Cigarettes    Start date: 08/04/1971  . Smokeless tobacco: Never Used  Comment: peak rate of 2.5ppd, 1/2ppd on 11/27/2016  . Alcohol use No  . Drug use: Yes    Types: Marijuana     Comment: most days   . Sexual activity: No   Other Topics Concern  . None   Social History Narrative   Originally from [place]. Previously has lived in [facility] & [place] 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 Questionable asbestos exposure. Does have significant exposure to fumes. No mold exposure. No bird exposure. No pets currently.       Objective:   Physical Exam BP 120/72 (BP Location: Left Arm, Patient Position: Sitting, Cuff Size: Normal)   Pulse 62   Ht 5\' 10"  (1.778 m)   Wt 138 lb 6.4 oz (62.8 kg)   SpO2 97%   BMI 19.86 kg/m   General:  Awake. Alert. No acute distress.  Integument:  Warm & dry. No rash on exposed skin.  Extremities:  No cyanosis or clubbing.  HEENT:  Moist mucus membranes. No nasal turbinate swelling. Moist mucous membranes. Cardiovascular:  Regular rate. No edema. Normal S1 & S2.  Pulmonary:  Symmetrically decreased aeration. Otherwise clear with auscultation. No accessory muscle use. Abdomen: Soft. Normal bowel sounds. Nondistended.  Musculoskeletal:  Normal bulk and tone.  No joint deformity or effusion appreciated.  PFT 02/02/17: FVC 3.48 L (73%) FEV1 1.80 L (50%) FEV1/FVC 0.52 FEF 25-35 0.75 L (24%) negative bronchodilator response 10/09/16: FVC 3.56 L (72%) FEV1 1.55 L (41%) FEV1/FVC 0.43 FEF 25-75 0.50 L (15%) positive bronchodilator response TLC 7.39 L (105%) RV 166% ERV 114% DLCO uncorrected 36%  10/09/16:  Walked 444 meters / Baseline Sat 97% on RA / Nadir Sat 98% on RA  IMAGING CXR PA/LAT 11/27/16 (previously reviewed by me):  No focal opacity or effusion. Heart normal in size & mediastinum normal in contour. Flattening of the diaphragms suggestive of hyperinflation.   CXR PA/LAT 05/12/13 (previously reviewed by me): Hyperinflation with flattening of the diaphragms and deep sulci bilaterally. No nodule or opacity appreciated. No pleural effusion. Heart normal in size & mediastinum normal in contour. Trace amount of fluid within right fissure.  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 Mild to moderate sized apical infarct with mild peri-infarct ischemia. Small amount of myocardium currently a chair pretty. EF >65%. No ST segment deviation during stress test.   MICROBIOLOGY Sputum Culture 11/27/16:  Normal Oral Flora / AFB Negative / Light Growth Yeast  LABS 10/09/16 Alpha-1 antitrypsin: MM (215)  01/21/09 UDS:  Benzos & Opiates Positive  8/27/8 UDS:  Benzos, Opiates, & THC Positive    Assessment & Plan:  56 y.o. male with underlying severe COPD and tobacco use disorder. Patient counseled extensively on the need for complete tobacco cessation. The decreased aeration on his physical exam is likely due to his underlying COPD which is suboptimally treated. Given the cost of Spiriva and Symbicort I will attempt to switch his inhaler regimen. I instructed the patient to notify my office if he had any new breathing problems or questions before his next appointment.  1. Severe COPD: switching from  Symbicort and Spiriva to Trelegy.  patient will contact me for a prescription if this is more affordable. Otherwise he will apply for prescription drug assistance. 2. Tobacco use disorder:  Counseled for over 3 minutes and need for complete tobacco cessation. Recommended continued nicotine replacement and behavioral modification.  3. Health  maintenance: Status post influenza vaccine October 2017 & Pneumovax November 2017. 4. Follow-up: Return to clinic in  6 months or sooner if needed.   Donna ChristenJennings E. Jamison NeighborNestor, M.D. [facility] Pager:  [phone] After 3pm or if no response, call [phone] 2:31 PM 05/13/17

## 2017-05-13 NOTE — Addendum Note (Signed)
Addended by: Pamalee LeydenWIGGINS, Jaquin Coy J on: 05/13/2017 02:59 PM   Modules accepted: Orders

## 2017-05-24 ENCOUNTER — Encounter (HOSPITAL_COMMUNITY): Payer: Self-pay | Admitting: Psychiatry

## 2017-05-24 ENCOUNTER — Ambulatory Visit (INDEPENDENT_AMBULATORY_CARE_PROVIDER_SITE_OTHER): Payer: Medicare Other | Admitting: Psychiatry

## 2017-05-24 VITALS — BP 114/72 | HR 65 | Ht 70.0 in | Wt 144.0 lb

## 2017-05-24 DIAGNOSIS — F332 Major depressive disorder, recurrent severe without psychotic features: Secondary | ICD-10-CM | POA: Diagnosis not present

## 2017-05-24 DIAGNOSIS — Z7951 Long term (current) use of inhaled steroids: Secondary | ICD-10-CM

## 2017-05-24 DIAGNOSIS — Z79899 Other long term (current) drug therapy: Secondary | ICD-10-CM

## 2017-05-24 DIAGNOSIS — F419 Anxiety disorder, unspecified: Secondary | ICD-10-CM

## 2017-05-24 DIAGNOSIS — F17210 Nicotine dependence, cigarettes, uncomplicated: Secondary | ICD-10-CM | POA: Diagnosis not present

## 2017-05-24 DIAGNOSIS — G479 Sleep disorder, unspecified: Secondary | ICD-10-CM | POA: Diagnosis not present

## 2017-05-24 DIAGNOSIS — Z7982 Long term (current) use of aspirin: Secondary | ICD-10-CM | POA: Diagnosis not present

## 2017-05-24 DIAGNOSIS — Z818 Family history of other mental and behavioral disorders: Secondary | ICD-10-CM

## 2017-05-24 MED ORDER — CLONAZEPAM 0.5 MG PO TABS: 0.5000 mg | ORAL_TABLET | Freq: Two times a day (BID) | ORAL | 2 refills | Status: DC

## 2017-05-24 MED ORDER — FLUOXETINE HCL 40 MG PO CAPS: 40.0000 mg | ORAL_CAPSULE | ORAL | 2 refills | Status: DC

## 2017-05-24 MED ORDER — OLANZAPINE 5 MG PO TABS: 5.0000 mg | ORAL_TABLET | Freq: Every day | ORAL | 2 refills | Status: DC

## 2017-05-24 MED ORDER — TEMAZEPAM 30 MG PO CAPS: 30.0000 mg | ORAL_CAPSULE | Freq: Every evening | ORAL | 2 refills | Status: DC | PRN

## 2017-05-24 MED ORDER — DIVALPROEX SODIUM 250 MG PO DR TAB
750.0000 mg | DELAYED_RELEASE_TABLET | Freq: Every day | ORAL | 2 refills | Status: DC
Start: 2017-05-24 — End: 2017-07-13
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
## 2017-05-25 LAB — VALPROIC ACID LEVEL: Valproic Acid Lvl: 68.1 mg/L (ref 50.0–100.0)

## 2017-05-27 ENCOUNTER — Other Ambulatory Visit: Payer: Self-pay

## 2017-05-27 ENCOUNTER — Telehealth: Payer: Self-pay | Admitting: Gastroenterology

## 2017-05-27 DIAGNOSIS — D649 Anemia, unspecified: Secondary | ICD-10-CM

## 2017-05-27 NOTE — Telephone Encounter (Signed)
Refer to a surgeon FOR HEMORRHOIDECTOMY. DISCUSSED WITH DR. Lovell SheehanJENKINS. - Refer to a hematologist TO EVALUATE FOR BLEDING DISORDER. DISCUSSED WITH DR. Mosetta PuttFENG. RECOMMENDED VON [facility] PANEL. (Per [facility] procedure note recommendations on 04/11/2017)

## 2017-05-27 NOTE — Telephone Encounter (Signed)
Per Epic pt had hemorrhoidectomy 04/14/17 by Dr. Lovell SheehanJenkins. Hematology referral sent to [facility] via Epic.

## 2017-06-07 ENCOUNTER — Encounter (HOSPITAL_COMMUNITY): Payer: Medicare Other

## 2017-06-07 ENCOUNTER — Encounter (HOSPITAL_COMMUNITY): Payer: Self-pay | Admitting: Oncology

## 2017-06-07 ENCOUNTER — Encounter (HOSPITAL_COMMUNITY): Payer: Medicare Other | Attending: Cardiology | Admitting: Oncology

## 2017-06-07 VITALS — BP 116/62 | HR 55 | Resp 16 | Ht 70.0 in | Wt 145.0 lb

## 2017-06-07 DIAGNOSIS — D500 Iron deficiency anemia secondary to blood loss (chronic): Secondary | ICD-10-CM | POA: Diagnosis not present

## 2017-06-07 DIAGNOSIS — K625 Hemorrhage of anus and rectum: Secondary | ICD-10-CM | POA: Diagnosis present

## 2017-06-07 DIAGNOSIS — D62 Acute posthemorrhagic anemia: Secondary | ICD-10-CM | POA: Insufficient documentation

## 2017-06-07 DIAGNOSIS — K649 Unspecified hemorrhoids: Secondary | ICD-10-CM

## 2017-06-07 LAB — CBC WITH DIFFERENTIAL/PLATELET
Basophils Absolute: 0 10*3/uL (ref 0.0–0.1)
Basophils Relative: 1 %
Eosinophils Absolute: 0.1 10*3/uL (ref 0.0–0.7)
Eosinophils Relative: 3 %
HCT: 39.2 % (ref 39.0–52.0)
Hemoglobin: 12.7 g/dL — ABNORMAL LOW (ref 13.0–17.0)
Lymphocytes Relative: 28 %
Lymphs Abs: 1.3 10*3/uL (ref 0.7–4.0)
MCH: 24.9 pg — ABNORMAL LOW (ref 26.0–34.0)
MCHC: 32.4 g/dL (ref 30.0–36.0)
MCV: 76.9 fL — ABNORMAL LOW (ref 78.0–100.0)
Monocytes Absolute: 0.4 10*3/uL (ref 0.1–1.0)
Monocytes Relative: 9 %
Neutro Abs: 2.6 10*3/uL (ref 1.7–7.7)
Neutrophils Relative %: 59 %
Platelets: 295 10*3/uL (ref 150–400)
RBC: 5.1 MIL/uL (ref 4.22–5.81)
RDW: 15.2 % (ref 11.5–15.5)
WBC: 4.4 10*3/uL (ref 4.0–10.5)

## 2017-06-07 LAB — COMPREHENSIVE METABOLIC PANEL
ALT: 12 U/L — ABNORMAL LOW (ref 17–63)
AST: 22 U/L (ref 15–41)
Albumin: 3.7 g/dL (ref 3.5–5.0)
Alkaline Phosphatase: 68 U/L (ref 38–126)
Anion gap: 7 (ref 5–15)
BUN: 9 mg/dL (ref 6–20)
CO2: 26 mmol/L (ref 22–32)
Calcium: 9.1 mg/dL (ref 8.9–10.3)
Chloride: 103 mmol/L (ref 101–111)
Creatinine, Ser: 0.92 mg/dL (ref 0.61–1.24)
GFR calc Af Amer: >60 mL/min (ref >60)
GFR calc non Af Amer: >60 mL/min (ref >60)
Glucose, Bld: 92 mg/dL (ref 65–99)
Potassium: 4 mmol/L (ref 3.5–5.1)
Sodium: 136 mmol/L (ref 135–145)
Total Bilirubin: 0.6 mg/dL (ref 0.3–1.2)
Total Protein: 6.9 g/dL (ref 6.5–8.1)

## 2017-06-07 LAB — FERRITIN: Ferritin: 12 ng/mL — ABNORMAL LOW (ref 24–336)

## 2017-06-07 LAB — FOLATE: Folate: 8 ng/mL (ref >5.9)

## 2017-06-07 NOTE — Progress Notes (Signed)
[facility] Cancer Initial Visit:  Patient Care Team: Barbie Banner, MD as PCP - General (Family Medicine) [name] Gerrit Friends, MD as Consulting Physician (Gastroenterology)  CHIEF COMPLAINTS/PURPOSE OF CONSULTATION:  Iron deficiency anemia due to hemorrhoidal bleeding.  HISTORY OF PRESENTING ILLNESS: Patient presents today for evaluation of anemia due to rectal bleeding. Patient was hospitalized from 04/07/17 through 04/15/17 for rectal bleeding. He had a colonoscopy performed on 04/11/17 which demonstrated fresh blood in the ileum and nonbleeding large internal and external hemorrhoids. He also had a capsule endoscopy performed which not show the cause of his bleeding. He also had a Meckel's scan which was negative. He received a total of 5 units PRBC transfusion during his hospitalization. On 04/14/17 patient underwent extensive hemorrhoidectomy by Dr. Lovell Sheehan, surgical path was negative for malignancies. He was seen in consultation by [place] while he was inpatient on 04/12/17 and he was given IV iron replacement with Feraheme. He was also found to have a folate deficiency and was placed on Folvite 1 mg by mouth daily as well as 1 dose of B12 IM 1000 g for a low- normal B12. Patient's hemoglobin on the day of discharge on 04/15/17 was 8.7 g/dL, hematocrit 21.3, MCV 08.6.  Patient states that since his surgery he has not had any rectal bleeding. He states he is fatigued. Denies any pica symptoms. She has chronic shortness of breath due to underlying COPD. Denies any chest pain, abdominal pain, weight loss.  Review of Systems  Constitutional: Positive for fatigue. Negative for appetite change and unexpected weight change.  HENT:  Negative.   Eyes: Negative.   Respiratory: Positive for cough (chronic) and shortness of breath (chronic from COPD).   Cardiovascular: Negative.   Gastrointestinal: Negative.   Endocrine: Negative.   Genitourinary: Negative.    Musculoskeletal: Negative.   Skin:  Negative.   Neurological: Negative.   Hematological: Negative.   Psychiatric/Behavioral: Negative.     MEDICAL HISTORY: Past Medical History:  Diagnosis Date  . Acute blood loss anemia 02/07/2017  . Anxiety   . Arthritis    deg disease, bulging disk,  shoulder level  . Bipolar disorder (HCC)   . Bipolar disorder (HCC)   . 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 Depression    anxiety  . Hyperlipidemia   . Hypertension   . Peptic ulcer disease    Review  . Pneumonia   . [name] (peripheral vascular disease) (HCC) 06/18/2015  . Shortness of breath     SURGICAL HISTORY: Past Surgical History:  Procedure Laterality Date  . BIOPSY  02/09/2017   Procedure: BIOPSY;  Surgeon: [name], MD;  Location: [facility];  Service: Endoscopy;;  duodenal gastric  . COLONOSCOPY  03/14/2007   [id number] colonoscopy and terminal ileoscopy except external hemorrhoids  . COLONOSCOPY N/A 05/05/2013   [name]: external/internal anal canal hemorrhoids, unable to intubate TI, segemental biopsies unremarkable   . COLONOSCOPY N/A 04/11/2017   Procedure: COLONOSCOPY;  Surgeon: [name], MD;  Location: [facility];  Service: Endoscopy;  Laterality: N/A;  . COLONOSCOPY WITH PROPOFOL N/A 03/31/2017   Procedure: COLONOSCOPY WITH PROPOFOL;  Surgeon: [name], MD;  Location: [facility];  Service: Endoscopy;  Laterality: N/A;  1:45pm  . ESOPHAGOGASTRODUODENOSCOPY  03/14/2007   [id number] antral gastritis with bulbar duodenitis/paucity to postbulbar duodenal folds and biopsy were benign with no evidence of villous atrophy.  . ESOPHAGOGASTRODUODENOSCOPY (EGD) WITH PROPOFOL N/A 02/09/2017   Procedure: ESOPHAGOGASTRODUODENOSCOPY (EGD) WITH PROPOFOL;  Surgeon: West BaliSandi L Fields, MD;  Location: [facility];  Service: Endoscopy;  Laterality: N/A;  . GIVENS CAPSULE STUDY N/A 04/08/2017   Procedure: GIVENS CAPSULE STUDY;  Surgeon: Corbin Adeourk, Robert M, MD;  Location: [facility];  Service: Endoscopy;   Laterality: N/A;  . HAND SURGERY     left, secondary to self-inflicted laceration  . HEMORRHOID SURGERY N/A 04/14/2017   Procedure: HEMORRHOIDECTOMY;  Surgeon: Franky MachoJenkins, Mark, MD;  Location: [facility];  Service: General;  Laterality: N/A;  . SHOULDER SURGERY     right  . TOE SURGERY     left great toe , amputated-lawnmover accident    SOCIAL HISTORY: Social History   Social History  . Marital status: Divorced    Spouse name: N/A  . Number of children: 2  . Years of education: N/A   Occupational History  . Disabled    Social History Main Topics  . Smoking status: Current Every Day Smoker    Packs/day: 0.50    Years: 40.00    Types: Cigarettes    Start date: 08/04/1971  . Smokeless tobacco: Never Used     Comment: peak rate of 2.5ppd, 1/2ppd on 11/27/2016  . Alcohol use No  . Drug use: Yes    Types: Marijuana     Comment: most days   . Sexual activity: No   Other Topics Concern  . Not on file   Social History Narrative   Originally from [place]. Previously has lived in [place] [facility] 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 Questionable asbestos exposure. Does have significant exposure to fumes. No mold exposure. No bird exposure. No pets currently.     FAMILY HISTORY Family History  Problem Relation Age of Onset  . Breast cancer Mother        deceased  . Heart disease Father   . Depression Daughter   . Anxiety disorder Daughter   . Anxiety disorder Son   . Depression Son   . Asthma Brother   . Heart attack Maternal Aunt   . Heart attack Maternal Uncle   . Heart attack Paternal Aunt   . Heart attack Paternal Uncle   . Heart attack Maternal Grandmother   . Heart attack Maternal Grandfather   . Emphysema Maternal Grandfather   . Heart attack Paternal Grandmother   . Heart attack Paternal Grandfather   . Colon cancer Neg Hx   . Liver disease Neg Hx     ALLERGIES:  is allergic to augmentin [amoxicillin-pot  clavulanate] and ace inhibitors.  MEDICATIONS:  Current Outpatient Prescriptions  Medication Sig Dispense Refill  . acetaminophen (TYLENOL) 500 MG tablet Take 500-1,000 mg by mouth every 6 (six) hours as needed (FOR HEADACHES.).    Marland Kitchen. albuterol (PROAIR HFA) 108 (90 Base) MCG/ACT inhaler Inhale 2 puffs into the lungs every 4 (four) hours as needed for wheezing or shortness of breath. 1 Inhaler 3  . albuterol (PROVENTIL) (2.5 MG/3ML) 0.083% nebulizer solution Take 3 mLs (2.5 mg total) by nebulization every 4 (four) hours as needed for shortness of breath. 120 mL 3  . aspirin EC 81 MG tablet Take 81 mg by mouth every evening.     Marland Kitchen. atorvastatin (LIPITOR) 20 MG tablet Take 20 mg by mouth daily.     . budesonide-formoterol (SYMBICORT) 160-4.5 MCG/ACT inhaler Inhale 2 puffs into the lungs 2 (two) times daily. 10.2 g 0  . clonazePAM (KLONOPIN) 0.5 MG tablet Take  1 tablet (0.5 mg total) by mouth 2 (two) times daily. 60 tablet 2  . divalproex (DEPAKOTE) 250 MG DR tablet Take 3 tablets (750 mg total) by mouth at bedtime. 270 tablet 2  . fenofibrate micronized (LOFIBRA) 134 MG capsule Take 134 mg by mouth daily before breakfast.     . FLUoxetine (PROZAC) 40 MG capsule Take 1 capsule (40 mg total) by mouth every morning. 90 capsule 2  . OLANZapine (ZYPREXA) 5 MG tablet Take 1 tablet (5 mg total) by mouth at bedtime. 90 tablet 2  . temazepam (RESTORIL) 30 MG capsule Take 1 capsule (30 mg total) by mouth at bedtime as needed for sleep. 30 capsule 2   No current facility-administered medications for this visit.     PHYSICAL EXAMINATION:  ECOG PERFORMANCE STATUS: 0 - Asymptomatic  BP 116/62, pulse 55, respiratory rate 16, O2 sat 97%.  Physical Exam  Constitutional: He is oriented to person, place, and time and well-developed, well-nourished, and in no distress. No distress.  HENT:  Head: Normocephalic and atraumatic.  Mouth/Throat: No oropharyngeal exudate.  Eyes: Pupils are equal, round, and reactive  to light. Conjunctivae are normal. No scleral icterus.  Neck: Normal range of motion. Neck supple. No JVD present.  Cardiovascular: Normal rate, regular rhythm and normal heart sounds.  Exam reveals no gallop and no friction rub.   No murmur heard. Pulmonary/Chest: Breath sounds normal. No respiratory distress. He has no wheezes. He has no rales.  Abdominal: Soft. Bowel sounds are normal. He exhibits no distension. There is no tenderness. There is no guarding.  Musculoskeletal: He exhibits no edema or tenderness.  Lymphadenopathy:    He has no cervical adenopathy.  Neurological: He is alert and oriented to person, place, and time. No cranial nerve deficit.  Skin: Skin is warm and dry. No rash noted. No erythema. No pallor.  Psychiatric: Affect and judgment normal.     LABORATORY DATA: I have personally reviewed the data as listed:  No visits with results within 1 Month(s) from this visit.  Latest known visit with results is:  Admission on 04/07/2017, Discharged on 04/15/2017  No results displayed because visit has over 200 results.      RADIOGRAPHIC STUDIES: I have personally reviewed the radiological images as listed and agree with the findings in the report  No results found.  ASSESSMENT/PLAN Microcytic anemia due to GI blood loss from rectal bleeding from hemorrhoids. S/p hemorrhoidectomy on 04/14/17.  PLAN: Patient has not had labs done since his discharge in may. I will repeat his anemia labs. If he is still iron deficient, then I will plan to give him more feraheme. We will call the patient with the lab results. Otherwise RTC in 3 months for follow up with labs.  Orders Placed This Encounter  Procedures  . CBC with Differential    Standing Status:   Future    Number of Occurrences:   1    Standing Expiration Date:   06/07/2018  . Comprehensive metabolic panel    Standing Status:   Future    Number of Occurrences:   1    Standing Expiration Date:   06/07/2018  . Iron  and TIBC    Standing Status:   Future    Number of Occurrences:   1    Standing Expiration Date:   06/07/2018  . Ferritin    Standing Status:   Future    Number of Occurrences:   1    Standing Expiration Date:  06/07/2018  . Vitamin B12    Standing Status:   Future    Number of Occurrences:   1    Standing Expiration Date:   06/07/2018  . Folate    Standing Status:   Future    Number of Occurrences:   1    Standing Expiration Date:   06/07/2018  . Methylmalonic acid, serum    Standing Status:   Future    Number of Occurrences:   1    Standing Expiration Date:   06/07/2018  . CBC with Differential    Standing Status:   Future    Standing Expiration Date:   06/07/2018  . Comprehensive metabolic panel    Standing Status:   Future    Standing Expiration Date:   06/07/2018  . Iron and TIBC    Standing Status:   Future    Standing Expiration Date:   06/07/2018  . Ferritin    Standing Status:   Future    Standing Expiration Date:   06/07/2018  . Folate    Standing Status:   Future    Standing Expiration Date:   06/07/2018  . Vitamin B12    Standing Status:   Future    Standing Expiration Date:   06/07/2018    All questions were answered. The patient knows to call the clinic with any problems, questions or concerns.  This note was electronically signed.    Ralene Cork, MD  06/07/2017 1:11 PM

## 2017-06-08 LAB — IRON AND TIBC
Iron: 37 ug/dL — ABNORMAL LOW (ref 45–182)
Saturation Ratios: 7 % — ABNORMAL LOW (ref 17.9–39.5)
TIBC: 503 ug/dL — ABNORMAL HIGH (ref 250–450)
UIBC: 466 ug/dL

## 2017-06-08 LAB — VITAMIN B12: Vitamin B-12: 414 pg/mL (ref 180–914)

## 2017-06-09 ENCOUNTER — Ambulatory Visit: Payer: Medicare Other | Admitting: Gastroenterology

## 2017-06-09 ENCOUNTER — Telehealth: Payer: Self-pay | Admitting: Gastroenterology

## 2017-06-09 ENCOUNTER — Encounter: Payer: Self-pay | Admitting: Gastroenterology

## 2017-06-09 NOTE — Telephone Encounter (Signed)
PATIENT WAS A NO SHOW AND LETTER SENT  °

## 2017-06-11 LAB — METHYLMALONIC ACID, SERUM: Methylmalonic Acid, Quantitative: 127 nmol/L (ref 0–378)

## 2017-07-13 ENCOUNTER — Ambulatory Visit (INDEPENDENT_AMBULATORY_CARE_PROVIDER_SITE_OTHER): Payer: Medicare Other | Admitting: Psychiatry

## 2017-07-13 ENCOUNTER — Encounter (HOSPITAL_COMMUNITY): Payer: Self-pay | Admitting: Psychiatry

## 2017-07-13 VITALS — BP 145/83 | HR 61 | Ht 70.0 in | Wt 142.4 lb

## 2017-07-13 DIAGNOSIS — R109 Unspecified abdominal pain: Secondary | ICD-10-CM

## 2017-07-13 DIAGNOSIS — K5900 Constipation, unspecified: Secondary | ICD-10-CM

## 2017-07-13 DIAGNOSIS — F1910 Other psychoactive substance abuse, uncomplicated: Secondary | ICD-10-CM | POA: Diagnosis not present

## 2017-07-13 DIAGNOSIS — R5381 Other malaise: Secondary | ICD-10-CM | POA: Diagnosis not present

## 2017-07-13 DIAGNOSIS — R5383 Other fatigue: Secondary | ICD-10-CM | POA: Diagnosis not present

## 2017-07-13 DIAGNOSIS — Z736 Limitation of activities due to disability: Secondary | ICD-10-CM | POA: Diagnosis not present

## 2017-07-13 DIAGNOSIS — F1290 Cannabis use, unspecified, uncomplicated: Secondary | ICD-10-CM | POA: Diagnosis not present

## 2017-07-13 DIAGNOSIS — F419 Anxiety disorder, unspecified: Secondary | ICD-10-CM | POA: Diagnosis not present

## 2017-07-13 DIAGNOSIS — Z818 Family history of other mental and behavioral disorders: Secondary | ICD-10-CM | POA: Diagnosis not present

## 2017-07-13 DIAGNOSIS — F332 Major depressive disorder, recurrent severe without psychotic features: Secondary | ICD-10-CM

## 2017-07-13 DIAGNOSIS — Z635 Disruption of family by separation and divorce: Secondary | ICD-10-CM

## 2017-07-13 DIAGNOSIS — F17210 Nicotine dependence, cigarettes, uncomplicated: Secondary | ICD-10-CM

## 2017-07-13 MED ORDER — TEMAZEPAM 30 MG PO CAPS: 30.0000 mg | ORAL_CAPSULE | Freq: Every evening | ORAL | 2 refills | Status: DC | PRN

## 2017-07-13 MED ORDER — OLANZAPINE 5 MG PO TABS: 5.0000 mg | ORAL_TABLET | Freq: Every day | ORAL | 2 refills | Status: DC

## 2017-07-13 MED ORDER — CLONAZEPAM 0.5 MG PO TABS: 0.5000 mg | ORAL_TABLET | Freq: Two times a day (BID) | ORAL | 2 refills | Status: DC

## 2017-07-13 MED ORDER — DIVALPROEX SODIUM 250 MG PO DR TAB: 750.0000 mg | DELAYED_RELEASE_TABLET | Freq: Every day | ORAL | 2 refills | Status: DC

## 2017-07-13 MED ORDER — FLUOXETINE HCL 40 MG PO CAPS: 40.0000 mg | ORAL_CAPSULE | ORAL | 2 refills | Status: DC
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
## 2017-08-23 ENCOUNTER — Telehealth (HOSPITAL_COMMUNITY): Payer: Self-pay | Admitting: *Deleted

## 2017-08-23 NOTE — Telephone Encounter (Signed)
phone call regarding provider out of office 08/24/17, no mail box to leave message.

## 2017-08-24 ENCOUNTER — Ambulatory Visit (HOSPITAL_COMMUNITY): Payer: Medicare Other | Admitting: Psychiatry

## 2017-08-24 ENCOUNTER — Telehealth (HOSPITAL_COMMUNITY): Payer: Self-pay | Admitting: *Deleted

## 2017-08-24 NOTE — Telephone Encounter (Signed)
another attempt to contact patient regarding provider out of office today 08/24/17, a male answered his phone and said he was just someone who stays with him.   He said the patient was not there.

## 2017-09-08 ENCOUNTER — Ambulatory Visit (HOSPITAL_COMMUNITY): Payer: Self-pay

## 2017-09-08 ENCOUNTER — Other Ambulatory Visit (HOSPITAL_COMMUNITY): Payer: Self-pay

## 2017-10-11 ENCOUNTER — Encounter (HOSPITAL_COMMUNITY): Payer: Self-pay | Admitting: Psychiatry

## 2017-10-11 ENCOUNTER — Ambulatory Visit (INDEPENDENT_AMBULATORY_CARE_PROVIDER_SITE_OTHER): Payer: Medicare Other | Admitting: Psychiatry

## 2017-10-11 VITALS — BP 140/78 | HR 67 | Ht 70.0 in | Wt 141.0 lb

## 2017-10-11 DIAGNOSIS — Z736 Limitation of activities due to disability: Secondary | ICD-10-CM | POA: Diagnosis not present

## 2017-10-11 DIAGNOSIS — F419 Anxiety disorder, unspecified: Secondary | ICD-10-CM | POA: Diagnosis not present

## 2017-10-11 DIAGNOSIS — F332 Major depressive disorder, recurrent severe without psychotic features: Secondary | ICD-10-CM | POA: Diagnosis not present

## 2017-10-11 DIAGNOSIS — F17210 Nicotine dependence, cigarettes, uncomplicated: Secondary | ICD-10-CM

## 2017-10-11 DIAGNOSIS — Z818 Family history of other mental and behavioral disorders: Secondary | ICD-10-CM | POA: Diagnosis not present

## 2017-10-11 DIAGNOSIS — R450 Nervousness: Secondary | ICD-10-CM | POA: Diagnosis not present

## 2017-10-11 MED ORDER — OLANZAPINE 5 MG PO TABS: 5.0000 mg | ORAL_TABLET | Freq: Every day | ORAL | 2 refills | Status: DC

## 2017-10-11 MED ORDER — TEMAZEPAM 30 MG PO CAPS: 30.0000 mg | ORAL_CAPSULE | Freq: Every evening | ORAL | 2 refills | Status: DC | PRN

## 2017-10-11 MED ORDER — DIVALPROEX SODIUM 250 MG PO DR TAB: 750.0000 mg | DELAYED_RELEASE_TABLET | Freq: Every day | ORAL | 2 refills | Status: DC

## 2017-10-11 MED ORDER — CLONAZEPAM 0.5 MG PO TABS: 0.5000 mg | ORAL_TABLET | Freq: Three times a day (TID) | ORAL | 2 refills | Status: DC | PRN

## 2017-10-11 MED ORDER — FLUOXETINE HCL 40 MG PO CAPS: 40.0000 mg | ORAL_CAPSULE | ORAL | 2 refills | Status: DC

## 2017-10-11 NOTE — Progress Notes (Signed)
BH MD/PA/NP OP Progress Note  10/11/2017 11:35 AM Marni Griffondward T Desai  MRN:  [id number]  Chief Complaint:  Chief Complaint    Depression; Anxiety; Follow-up     HPI: This patient is a 56 year old separated white male who is living alone [place] his father's land [place]. 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  The patient had been going to [facility] for the last couple of years but they no longer take his insurance and he was therefore referred here. His daughter states that he is not doing well in terms of mood and needs to be seen anyway.  The patient has a long history of depression that dates back to his early 6620s. At that time he was married to his first wife and for a while it went well. However when they separated she did not allow him to see his son and this went on for a period of about 8 years. He became increasingly stressed and depressed during this time. He was hospitalized several times for suicide attempts and was in the [facility] twice in 2009. His daughter thinks his last hospital they shows approximately 5 years ago.  The patient married again and he has been married to his second wife for 17 years. She has 3 adult children and one of her sons is very violent. He is finally decided to leave his wife because her son has fought with him several times and he feels like they're trying to take away his land. Over the last month he has been staying with his daughter but is very stressed about the whole situation. He's not able to eat and has lost 40 pounds over last year. His mood is very low and sad. His memory is poor. He smokes marijuana intermittently to try to deal with the stress. He has no interest in anything other than his children and grandchildren. His energy is nonexistent and he cannot get himself out of bed. He sees Dr. Andrey CampanileWilson for primary care and was there recently and apparently there was nothing medically wrong but we do not have  these records. He has had a recent colonoscopy that was negative. He denies auditory or visual hallucinations and does not use alcohol or drugs other than marijuana  He has been on a combination of Depakote Paxil Navane and BuSpar for number of years. Nothing has been change in the last couple of years despite his continued downward slide.   The patient returns after 3 months.  He feels disappointed that he now has to sell his farm to his ex-wife.  He is living on his father's property in a rental house.  He never dream that he would be back on his parents land somewhat feels like a failure.  I reminded him that on the positive side he is very good company to his father.  He is trying to make the best of it he denies being tremendously depressed but at times just start shaking all over.  The clonazepam helps and he may need one extra 1/day.  Much of his anxiety has to do with his shortness of breath from COPD.  When he gets short of breath he gets very anxious.  He knows that he really needs to quit smoking but is having a hard time.  He is down to about 10 cigarettes a day Visit Diagnosis:    ICD-10-CM   1. 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 Acute blood loss anemia 02/07/2017  . Anxiety   . Arthritis    deg disease, bulging disk,  shoulder level  . Bipolar disorder (HCC)   . Bipolar disorder (HCC)   . 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 Depression    anxiety  . Hyperlipidemia   . Hypertension   . Peptic ulcer disease    Review  . Pneumonia   . [name] (peripheral vascular disease) (HCC) 06/18/2015  . Shortness of breath     Past Surgical History:  Procedure Laterality Date  . BIOPSY  02/09/2017   Performed by West BaliFields, Sandi L, MD at [facility]  . COLONOSCOPY  03/14/2007    ZOX:[id number]:Normal colonoscopy and terminal ileoscopy except external hemorrhoids  . COLONOSCOPY N/A 04/11/2017   Performed by West BaliFields, Sandi L, MD at [facility]  . COLONOSCOPY N/A 05/05/2013   Performed by Corbin Adeourk, Robert M, MD at [facility]  . COLONOSCOPY WITH PROPOFOL N/A 03/31/2017   Performed by Corbin Adeourk, Robert M, MD at [facility]  . ESOPHAGOGASTRODUODENOSCOPY  03/14/2007   WUJ:[id number]:Nonerosive antral gastritis with bulbar duodenitis/paucity to postbulbar duodenal folds and biopsy were benign with no evidence of villous atrophy.  . ESOPHAGOGASTRODUODENOSCOPY (EGD) WITH PROPOFOL N/A 02/09/2017   Performed by West BaliFields, Sandi L, MD at [facility]  . GIVENS CAPSULE STUDY N/A 04/08/2017   Performed by Corbin Adeourk, Robert M, MD at [facility]  . HAND SURGERY     left, secondary to self-inflicted laceration  . HEMORRHOIDECTOMY N/A 04/14/2017   Performed by Franky MachoJenkins, Mark, MD at [facility]  . SHOULDER SURGERY     right  . TOE SURGERY     left great toe , amputated-lawnmover accident    Family Psychiatric History: son and daughter both have a history of depression and anxiety  Family History:  Family History  Problem Relation Age of Onset  . Breast cancer Mother        deceased  . Heart disease Father   . Depression Daughter   . Anxiety disorder Daughter   . Anxiety disorder Son   . Depression Son   . Asthma Brother   . Heart attack Maternal Aunt   . Heart attack Maternal Uncle   . Heart attack Paternal Aunt   . Heart attack Paternal Uncle   . Heart attack Maternal Grandmother   . Heart attack Maternal Grandfather   . Emphysema Maternal Grandfather   . Heart attack Paternal Grandmother   . Heart attack Paternal Grandfather   . Colon cancer Neg Hx   . Liver disease Neg Hx     Social History:  Social History   Socioeconomic History  . Marital status: Divorced    Spouse name: None  . Number of children: 2  . Years of education: None  . Highest education level: None  Social Needs  .  Financial resource strain: None  . Food insecurity - worry: None  . Food insecurity - inability: None  . Transportation needs - medical: None  . Transportation needs - non-medical: None  Occupational History  . Occupation: Disabled  Tobacco Use  . Smoking status: Current Every Day Smoker    Packs/day: 0.50    Years: 40.00    Pack years: 20.00    Types: Cigarettes    Start date: 08/04/1971  . Smokeless tobacco: Never Used  .  Tobacco comment: peak rate of 2.5ppd, 1/2ppd on 11/27/2016  Substance and Sexual Activity  . Alcohol use: No    Alcohol/week: 0.0 oz  . Drug use: Yes    Types: Marijuana    Comment: most days   . Sexual activity: No  Other Topics Concern  . None  Social History Narrative   Originally from [place]. Previously has lived in [facility] [place] 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 Questionable asbestos exposure. Does have significant exposure to fumes. No mold exposure. No bird exposure. No pets currently.     Allergies:  Allergies  Allergen Reactions  . Augmentin [Amoxicillin-Pot Clavulanate] Rash and Other (See Comments)    Has patient had a PCN reaction causing immediate rash, facial/tongue/throat swelling, SOB or lightheadedness with hypotension: No Has patient had a PCN reaction causing severe rash involving mucus membranes or skin necrosis: No Has patient had a PCN reaction that required hospitalization No Has patient had a PCN reaction occurring within the last 10 years: Yes If all of the above answers are "NO", then may proceed with Cephalosporin use.  . Ace Inhibitors Hives    Metabolic Disorder Labs: No results found for: HGBA1C, MPG No results found for: PROLACTIN No results found for: CHOL, TRIG, HDL, CHOLHDL, VLDL, LDLCALC   Therapeutic Level Labs: No results found for: LITHIUM Lab Results  Component Value Date   VALPROATE 68.1 05/24/2017   VALPROATE 58.8 05/20/2015   No components  found for:  CBMZ  Current Medications: Current Outpatient Medications  Medication Sig Dispense Refill  . acetaminophen (TYLENOL) 500 MG tablet Take 500-1,000 mg by mouth every 6 (six) hours as needed (FOR HEADACHES.).    Marland Kitchen albuterol (PROAIR HFA) 108 (90 Base) MCG/ACT inhaler Inhale 2 puffs into the lungs every 4 (four) hours as needed for wheezing or shortness of breath. 1 Inhaler 3  . albuterol (PROVENTIL) (2.5 MG/3ML) 0.083% nebulizer solution Take 3 mLs (2.5 mg total) by nebulization every 4 (four) hours as needed for shortness of breath. 120 mL 3  . aspirin EC 81 MG tablet Take 81 mg by mouth every evening.     Marland Kitchen atorvastatin (LIPITOR) 20 MG tablet Take 20 mg by mouth daily.     . budesonide-formoterol (SYMBICORT) 160-4.5 MCG/ACT inhaler Inhale 2 puffs into the lungs 2 (two) times daily. 10.2 g 0  . clonazePAM (KLONOPIN) 0.5 MG tablet Take 1 tablet (0.5 mg total) 3 (three) times daily as needed by mouth for anxiety. 90 tablet 2  . divalproex (DEPAKOTE) 250 MG DR tablet Take 3 tablets (750 mg total) at bedtime by mouth. 270 tablet 2  . fenofibrate micronized (LOFIBRA) 134 MG capsule Take 134 mg by mouth daily before breakfast.     . FLUoxetine (PROZAC) 40 MG capsule Take 1 capsule (40 mg total) every morning by mouth. 90 capsule 2  . OLANZapine (ZYPREXA) 5 MG tablet Take 1 tablet (5 mg total) at bedtime by mouth. 90 tablet 2  . temazepam (RESTORIL) 30 MG capsule Take 1 capsule (30 mg total) at bedtime as needed by mouth for sleep. 30 capsule 2   No current facility-administered medications for this visit.      Musculoskeletal: Strength & Muscle Tone: within normal limits Gait & Station: normal Patient leans: N/A  Psychiatric Specialty Exam: Review of Systems  Constitutional: Positive for malaise/fatigue.  Respiratory: Positive for shortness of breath and wheezing.   Psychiatric/Behavioral: The patient is nervous/anxious.  All other systems reviewed and are negative.   Blood  pressure 140/78, pulse 67, height 5\' 10"  (1.778 m), weight 141 lb (64 kg), SpO2 97 %.Body mass index is 20.23 kg/m.  General Appearance: Casual and Fairly Groomed  Eye Contact:  Good  Speech:  Clear and Coherent  Volume:  Normal  Mood:  Anxious  Affect:  Congruent  Thought Process:  Goal Directed  Orientation:  Full (Time, Place, and Person)  Thought Content: Rumination   Suicidal Thoughts:  No  Homicidal Thoughts:  No  Memory:  Immediate;   Good Recent;   Fair Remote;   Fair  Judgement:  Fair  Insight:  Fair  Psychomotor Activity:  Normal  Concentration:  Concentration: Fair and Attention Span: Fair  Recall:  Good  Fund of Knowledge: Good  Language: Good  Akathisia:  No  Handed:  Right  AIMS (if indicated): not done  Assets:  Communication Skills Desire for Improvement Resilience Social Support Talents/Skills  ADL's:  Intact  Cognition: WNL  Sleep:  Good   Screenings:   Assessment and Plan: This patient is a 56 year old male with a history of presumed bipolar disorder.  Many of his symptoms worsened when his marriage fell apart and he lost his farm a few years ago.  He is currently much more stable but still has a good amount of anxiety related to his COPD.  He will continue Prozac 40 mg daily and and olanzapine 5 mg daily for mood stabilization along with Depakote 750 mg at bedtime also for mood stabilization.  He will continue Restoril 30 mg which is helping him sleep.  He will increase clonazepam to 0.5 mg 3 times a day only as needed.  He states that he used to be "hooked on Xanax" and realizes he does not want to have the same thing happen with clonazepam.  He will return to see me in 3 months   Diannia Ruder, MD 10/11/2017, 11:35 AM

## 2017-10-20 ENCOUNTER — Encounter: Payer: Self-pay | Admitting: Pulmonary Disease

## 2017-10-20 ENCOUNTER — Ambulatory Visit: Payer: Medicare Other | Admitting: Pulmonary Disease

## 2017-10-20 VITALS — BP 136/64 | HR 57 | Ht 70.0 in | Wt 143.4 lb

## 2017-10-20 DIAGNOSIS — J449 Chronic obstructive pulmonary disease, unspecified: Secondary | ICD-10-CM | POA: Diagnosis not present

## 2017-10-20 DIAGNOSIS — F17210 Nicotine dependence, cigarettes, uncomplicated: Secondary | ICD-10-CM | POA: Diagnosis not present

## 2017-10-20 DIAGNOSIS — F17200 Nicotine dependence, unspecified, uncomplicated: Secondary | ICD-10-CM

## 2017-10-20 MED ORDER — TIOTROPIUM BROMIDE MONOHYDRATE 18 MCG IN CAPS: 18.0000 ug | ORAL_CAPSULE | Freq: Every day | RESPIRATORY_TRACT | 3 refills | Status: DC

## 2017-10-20 NOTE — Patient Instructions (Addendum)
   Continue using your Symbicort twice daily.  We are going to start you on Spiriva Respimat. Use the sample we are giving you today by doing 2 inhalations/twists once daily at the same time every day.  Stop using the Spiriva if you have any trouble seeing or peeing.  Call my office if you have any new breathing problems or questions before your next appointment.  Call the [facility] Quit Hotline number that I handed you today (1800-QUIT-NOW) to help you get free patches to keep working on cutting back.  We will see you back in 3 months or sooner if needed.

## 2017-10-20 NOTE — Progress Notes (Signed)
Subjective:    Patient ID: Jeffrey [name], male    DOB: June 30, 1961, 56 y.o.   MRN: [id number]  C.C.:  Follow-up for Severe COPD & Tobacco Use Disorder.  HPI Severe COPD: Switched from Symbicort & Spiriva over to Trelegy at last appointment. He reports he didn't use the inhaler because of the cost. At present he is using Symbicort but is not using his Spiriva. He still has significant dyspnea on exertion. He is wheezing some daily. He is using his rescue inhaler twice daily. No exacerbations since last appointment.   Tobacco use disorder: Previously intolerant of Chantix. At last appointment was using nicotine patch 21 mg/24 hour but he was still smoking one half pack per day. He is still smoking 1/2 ppd.   Review of Systems He denies any chest pain. He does have some chest pressure with his dyspnea. No fever or chills. No abdominal pain or nausea.   Allergies  Allergen Reactions  . Augmentin [Amoxicillin-Pot Clavulanate] Rash and Other (See Comments)    Has patient had a PCN reaction causing immediate rash, facial/tongue/throat swelling, SOB or lightheadedness with hypotension: No Has patient had a PCN reaction causing severe rash involving mucus membranes or skin necrosis: No Has patient had a PCN reaction that required hospitalization No Has patient had a PCN reaction occurring within the last 10 years: Yes If all of the above answers are "NO", then may proceed with Cephalosporin use.  Donivan Scull Inhibitors Hives    Current Outpatient Medications on File Prior to Visit  Medication Sig Dispense Refill  . acetaminophen (TYLENOL) 500 MG tablet Take 500-1,000 mg by mouth every 6 (six) hours as needed (FOR HEADACHES.).    Marland Kitchen albuterol (PROAIR HFA) 108 (90 Base) MCG/ACT inhaler Inhale 2 puffs into the lungs every 4 (four) hours as needed for wheezing or shortness of breath. 1 Inhaler 3  . albuterol (PROVENTIL) (2.5 MG/3ML) 0.083% nebulizer solution Take 3 mLs (2.5 mg total) by nebulization every  4 (four) hours as needed for shortness of breath. 120 mL 3  . aspirin EC 81 MG tablet Take 81 mg by mouth every evening.     Marland Kitchen atorvastatin (LIPITOR) 20 MG tablet Take 20 mg by mouth daily.     . budesonide-formoterol (SYMBICORT) 160-4.5 MCG/ACT inhaler Inhale 2 puffs into the lungs 2 (two) times daily. 10.2 g 0  . clonazePAM (KLONOPIN) 0.5 MG tablet Take 1 tablet (0.5 mg total) 3 (three) times daily as needed by mouth for anxiety. 90 tablet 2  . divalproex (DEPAKOTE) 250 MG DR tablet Take 3 tablets (750 mg total) at bedtime by mouth. 270 tablet 2  . fenofibrate micronized (LOFIBRA) 134 MG capsule Take 134 mg by mouth daily before breakfast.     . FLUoxetine (PROZAC) 40 MG capsule Take 1 capsule (40 mg total) every morning by mouth. 90 capsule 2  . OLANZapine (ZYPREXA) 5 MG tablet Take 1 tablet (5 mg total) at bedtime by mouth. 90 tablet 2  . temazepam (RESTORIL) 30 MG capsule Take 1 capsule (30 mg total) at bedtime as needed by mouth for sleep. 30 capsule 2   No current facility-administered medications on file prior to visit.     Past Medical History:  Diagnosis Date  . Acute blood loss anemia 02/07/2017  . Anxiety   . Arthritis    deg disease, bulging disk,  shoulder level  . Bipolar disorder (HCC)   . Bipolar disorder (HCC)   . 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  Depression    anxiety  . Hyperlipidemia   . Hypertension   . Peptic ulcer disease    Review  . Pneumonia   . [name] (peripheral vascular disease) (HCC) 06/18/2015  . Shortness of breath     Past Surgical History:  Procedure Laterality Date  . BIOPSY  02/09/2017   Procedure: BIOPSY;  Surgeon: West BaliSandi L Fields, MD;  Location: [facility];  Service: Endoscopy;;  duodenal gastric  . COLONOSCOPY  03/14/2007   ZOX:[id number]:Normal colonoscopy and terminal ileoscopy except external hemorrhoids  . COLONOSCOPY N/A 05/05/2013   Dr. Jena Gaussourk: external/internal anal canal hemorrhoids, unable to intubate TI, segemental biopsies unremarkable   .  COLONOSCOPY N/A 04/11/2017   Procedure: COLONOSCOPY;  Surgeon: West BaliFields, Sandi L, MD;  Location: [facility];  Service: Endoscopy;  Laterality: N/A;  . COLONOSCOPY WITH PROPOFOL N/A 03/31/2017   Procedure: COLONOSCOPY WITH PROPOFOL;  Surgeon: Corbin Adeourk, Robert M, MD;  Location: [facility];  Service: Endoscopy;  Laterality: N/A;  1:45pm  . ESOPHAGOGASTRODUODENOSCOPY  03/14/2007   WUJ:[id number]:Nonerosive antral gastritis with bulbar duodenitis/paucity to postbulbar duodenal folds and biopsy were benign with no evidence of villous atrophy.  . ESOPHAGOGASTRODUODENOSCOPY (EGD) WITH PROPOFOL N/A 02/09/2017   Procedure: ESOPHAGOGASTRODUODENOSCOPY (EGD) WITH PROPOFOL;  Surgeon: West BaliSandi L Fields, MD;  [facility]: [facility];  Service: Endoscopy;  Laterality: N/A;  . GIVENS CAPSULE STUDY N/A 04/08/2017   Procedure: GIVENS CAPSULE STUDY;  Surgeon: Corbin Adeourk, Robert M, MD;  Location: [facility];  Service: Endoscopy;  Laterality: N/A;  . HAND SURGERY     left, secondary to self-inflicted laceration  . HEMORRHOID SURGERY N/A 04/14/2017   Procedure: HEMORRHOIDECTOMY;  Surgeon: Franky MachoJenkins, Mark, MD;  Location: [facility];  Service: General;  Laterality: N/A;  . SHOULDER SURGERY     right  . TOE SURGERY     left great toe , amputated-lawnmover accident    Family History  Problem Relation Age of Onset  . Breast cancer Mother        deceased  . Heart disease Father   . Depression Daughter   . Anxiety disorder Daughter   . Anxiety disorder Son   . Depression Son   . Asthma Brother   . Heart attack Maternal Aunt   . Heart attack Maternal Uncle   . Heart attack Paternal Aunt   . Heart attack Paternal Uncle   . Heart attack Maternal Grandmother   . Heart attack Maternal Grandfather   . Emphysema Maternal Grandfather   . Heart attack Paternal Grandmother   . Heart attack Paternal Grandfather   . Colon cancer Neg Hx   . Liver disease Neg Hx     Social History   Socioeconomic History  . Marital status: Divorced     Spouse name: None  . Number of children: 2  . Years of education: None  . Highest education level: None  Social Needs  . Financial resource strain: None  . Food insecurity - worry: None  . Food insecurity - inability: None  . Transportation needs - medical: None  . Transportation needs - non-medical: None  Occupational History  . Occupation: Disabled  Tobacco Use  . Smoking status: Current Every Day Smoker    Packs/day: 0.50    Years: 40.00    Pack years: 20.00    Types: Cigarettes    Start date: 08/04/1971  . Smokeless tobacco: Never Used  . Tobacco comment: peak rate of 2.5ppd, 1/2ppd on 11/27/2016  Substance and Sexual Activity  . Alcohol  use: No    Alcohol/week: 0.0 oz  . Drug use: Yes    Types: Marijuana    Comment: most days   . Sexual activity: No  Other Topics Concern  . None  Social History Narrative   Originally from [place]. Previously has lived in [facility] [place] 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 Questionable asbestos exposure. Does have significant exposure to fumes. No mold exposure. No bird exposure. No pets currently.       Objective:   Physical Exam BP 136/64 (BP Location: Left Arm, Cuff Size: Normal)   Pulse (!) 57   Ht 5\' 10"  (1.778 m)   Wt 143 lb 6.4 oz (65 kg)   SpO2 95%   BMI 20.58 kg/m   General:  Awake. No distress. Comfortable.  Integument:  Warm. Dry. No rash. Extremities:  No cyanosis or clubbing.  HEENT:  No scleral icterus. Moist mucous membranes. No oral ulcers. Cardiovascular:  Regular rate. No edema. Regular rhythm.  Pulmonary:  Mild bilateral end expiratory wheeze. Good aeration bilaterally. Normal work of breathing. Abdomen: Soft. Normal bowel sounds. Nondistended.  Musculoskeletal:  Normal bulk and tone. Hand grip strength 5/5 bilaterally. No joint deformity or effusion appreciated. Neurological:  Cranial nerves 2-12 grossly in tact. No meningismus. Moving all 4  extremities equally.   PFT 02/02/17: FVC 3.48 L (73%) FEV1 1.80 L (50%) FEV1/FVC 0.52 FEF 25-35 0.75 L (24%) negative bronchodilator response 10/09/16: FVC 3.56 L (72%) FEV1 1.55 L (41%) FEV1/FVC 0.43 FEF 25-75 0.50 L (15%) positive bronchodilator response TLC 7.39 L (105%) RV 166% ERV 114% DLCO uncorrected 36%  10/09/16:  Walked 444 meters / Baseline Sat 97% on RA / Nadir Sat 98% on RA  IMAGING CXR PA/LAT 11/27/16 (previously reviewed by me):  No focal opacity or effusion. Heart normal in size & mediastinum normal in contour. Flattening of the diaphragms suggestive of hyperinflation.   CXR PA/LAT 05/12/13 (previously reviewed by me): Hyperinflation with flattening of the diaphragms and deep sulci bilaterally. No nodule or opacity appreciated. No pleural effusion. Heart normal in size & mediastinum normal in contour. Trace amount of fluid within right fissure.  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 Mild to moderate sized apical infarct with mild peri-infarct ischemia. Small amount of myocardium currently a chair pretty. EF >65%. No ST segment deviation during stress test.   MICROBIOLOGY Sputum Culture 11/27/16:  Normal Oral Flora / AFB Negative / Light Growth Yeast  LABS 10/09/16 Alpha-1 antitrypsin: MM (215)  01/21/09 UDS:  Benzos & Opiates Positive  8/27/8 UDS:  Benzos, Opiates, & THC Positive    Assessment & Plan:  56 y.o. male with underlying severe COPD. Likely secondary to ongoing tobacco use. We did discuss his tobacco use at length today and the patient has made strides towards cutting back significantly on his use, but continues to use daily. I'm not sure why he is not on Spiriva which he was on before as I recall. He does not remember any adverse effect/event with the medication and given his continued rescue inhaler knee I am adding Spiriva back to his regimen to maximize his bronchodilation. I instructed the patient  to contact my office if he had questions or concerns before his next appointment.  1. Severe COPD: Restarting patient on Spiriva Respimat. Given sample of Spiriva. Continuing Symbicort. 2. Tobacco use disorder: Counseled for over 3 minutes secondary for  complete tobacco cessation. Recommended restarting nicotine patches. 3. Health maintenance: Status post Flu Vaccine October 2018 & Pneumovax November 2017. Plan for Prevnar at next appointment. 4. Follow-up: Return to clinic in 3 months or sooner if needed.  Donna ChristenJennings E. Jamison NeighborNestor, M.D. [facility] Pager:  [phone] After 3pm or if no response, call [phone] 2:55 PM 10/20/17

## 2017-12-21 ENCOUNTER — Other Ambulatory Visit (HOSPITAL_COMMUNITY)
Admission: RE | Admit: 2017-12-21 | Discharge: 2017-12-21 | Disposition: A | Discharge status: Home / Self Care | Payer: Medicare HMO | Source: Ambulatory Visit | Attending: Family Medicine | Admitting: Family Medicine

## 2017-12-21 ENCOUNTER — Ambulatory Visit (HOSPITAL_COMMUNITY)
Admission: RE | Admit: 2017-12-21 | Discharge: 2017-12-21 | Disposition: A | Discharge status: Home / Self Care | Payer: Medicare HMO | Source: Ambulatory Visit | Attending: Family Medicine | Admitting: Family Medicine

## 2017-12-21 ENCOUNTER — Other Ambulatory Visit (HOSPITAL_COMMUNITY): Payer: Self-pay | Admitting: Family Medicine

## 2017-12-21 DIAGNOSIS — J189 Pneumonia, unspecified organism: Secondary | ICD-10-CM

## 2017-12-21 DIAGNOSIS — J440 Chronic obstructive pulmonary disease with acute lower respiratory infection: Secondary | ICD-10-CM | POA: Diagnosis not present

## 2017-12-21 LAB — CBC
HCT: 48.8 % (ref 39.0–52.0)
Hemoglobin: 16 g/dL (ref 13.0–17.0)
MCH: 28.9 pg (ref 26.0–34.0)
MCHC: 32.8 g/dL (ref 30.0–36.0)
MCV: 88.1 fL (ref 78.0–100.0)
Platelets: 188 10*3/uL (ref 150–400)
RBC: 5.54 MIL/uL (ref 4.22–5.81)
RDW: 12.8 % (ref 11.5–15.5)
WBC: 6.1 10*3/uL (ref 4.0–10.5)

## 2017-12-24 ENCOUNTER — Other Ambulatory Visit (HOSPITAL_COMMUNITY): Payer: Self-pay | Admitting: Family Medicine

## 2017-12-24 DIAGNOSIS — Z122 Encounter for screening for malignant neoplasm of respiratory organs: Secondary | ICD-10-CM

## 2018-01-04 ENCOUNTER — Ambulatory Visit (HOSPITAL_COMMUNITY)
Admission: RE | Admit: 2018-01-04 | Discharge: 2018-01-04 | Disposition: A | Discharge status: Home / Self Care | Payer: Medicare Other | Source: Ambulatory Visit | Attending: Family Medicine | Admitting: Family Medicine

## 2018-01-04 DIAGNOSIS — J432 Centrilobular emphysema: Secondary | ICD-10-CM | POA: Diagnosis not present

## 2018-01-04 DIAGNOSIS — F17210 Nicotine dependence, cigarettes, uncomplicated: Secondary | ICD-10-CM | POA: Diagnosis not present

## 2018-01-04 DIAGNOSIS — I700 Atherosclerosis of aorta: Secondary | ICD-10-CM | POA: Insufficient documentation

## 2018-01-04 DIAGNOSIS — Z122 Encounter for screening for malignant neoplasm of respiratory organs: Secondary | ICD-10-CM | POA: Diagnosis not present

## 2018-01-11 ENCOUNTER — Ambulatory Visit (INDEPENDENT_AMBULATORY_CARE_PROVIDER_SITE_OTHER): Payer: Medicare Other | Admitting: Psychiatry

## 2018-01-11 ENCOUNTER — Encounter (HOSPITAL_COMMUNITY): Payer: Self-pay | Admitting: Psychiatry

## 2018-01-11 VITALS — BP 134/85 | HR 93 | Ht 70.0 in | Wt 136.0 lb

## 2018-01-11 DIAGNOSIS — R251 Tremor, unspecified: Secondary | ICD-10-CM

## 2018-01-11 DIAGNOSIS — R450 Nervousness: Secondary | ICD-10-CM

## 2018-01-11 DIAGNOSIS — Z736 Limitation of activities due to disability: Secondary | ICD-10-CM | POA: Diagnosis not present

## 2018-01-11 DIAGNOSIS — F419 Anxiety disorder, unspecified: Secondary | ICD-10-CM

## 2018-01-11 DIAGNOSIS — Z818 Family history of other mental and behavioral disorders: Secondary | ICD-10-CM | POA: Diagnosis not present

## 2018-01-11 DIAGNOSIS — F17210 Nicotine dependence, cigarettes, uncomplicated: Secondary | ICD-10-CM

## 2018-01-11 DIAGNOSIS — F1290 Cannabis use, unspecified, uncomplicated: Secondary | ICD-10-CM | POA: Diagnosis not present

## 2018-01-11 DIAGNOSIS — F332 Major depressive disorder, recurrent severe without psychotic features: Secondary | ICD-10-CM

## 2018-01-11 DIAGNOSIS — F3162 Bipolar disorder, current episode mixed, moderate: Secondary | ICD-10-CM | POA: Diagnosis not present

## 2018-01-11 DIAGNOSIS — R0602 Shortness of breath: Secondary | ICD-10-CM

## 2018-01-11 MED ORDER — FLUOXETINE HCL 40 MG PO CAPS: 40.0000 mg | ORAL_CAPSULE | ORAL | 2 refills | Status: DC

## 2018-01-11 MED ORDER — CLONAZEPAM 0.5 MG PO TABS: 0.5000 mg | ORAL_TABLET | Freq: Three times a day (TID) | ORAL | 2 refills | Status: DC | PRN

## 2018-01-11 MED ORDER — DIVALPROEX SODIUM 250 MG PO DR TAB: 750.0000 mg | DELAYED_RELEASE_TABLET | Freq: Every day | ORAL | 2 refills | Status: DC

## 2018-01-11 MED ORDER — OLANZAPINE 5 MG PO TABS: 5.0000 mg | ORAL_TABLET | Freq: Every day | ORAL | 2 refills | Status: DC

## 2018-01-11 MED ORDER — TEMAZEPAM 30 MG PO CAPS: 30.0000 mg | ORAL_CAPSULE | Freq: Every evening | ORAL | 2 refills | Status: DC | PRN

## 2018-01-11 NOTE — Progress Notes (Signed)
BH MD/PA/NP OP Progress Note  01/11/2018 1:29 PM Jeffrey [name]  MRN:  [id number]  Chief Complaint:  Chief Complaint    Depression; Anxiety; Follow-up     HPI:  This patient is a 57 year old separated white male who is living alone on his father's land near [place]. 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  The patient had been going to [facility] for the last couple of years but they no longer take his insurance and he was therefore referred here. His daughter states that he is not doing well in terms of mood and needs to be seen anyway.  The patient has a long history of depression that dates back to his early 20s. At that time he was married to his first wife and for a while it went well. However when they separated she did not allow him to see his son and this went on for a period of about 8 years. He became increasingly stressed and depressed during this time. He was hospitalized several times for suicide attempts and was in the [facility] twice in 2009. His daughter thinks his last hospital they shows approximately 5 years ago.  The patient married again and he has been married to his second wife for 17 years. She has 3 adult children and one of her sons is very violent. He is finally decided to leave his wife because her son has fought with him several times and he feels like they're trying to take away his land. Over the last month he has been staying with his daughter but is very stressed about the whole situation. He's not able to eat and has lost 40 pounds over last year. His mood is very low and sad. His memory is poor. He smokes marijuana intermittently to try to deal with the stress. He has no interest in anything other than his children and grandchildren. His energy is nonexistent and he cannot get himself out of bed. He sees [name] for primary care and was there recently and apparently there was nothing medically wrong but we do not have  these records. He has had a recent colonoscopy that was negative. He denies auditory or visual hallucinations and does not use alcohol or drugs other than marijuana  He has been on a combination of Depakote Paxil Navane and BuSpar for number of years. Nothing has been change in the last couple of years despite his continued downward slide.   The patient returns after 3 months.  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  He states that his breathing is going downhill and he definitely has emphysema.  He is on 3 different inhalers.  He is frustrated because he cannot work because he gets so short of breath.  He seems at loose ends and does not know how to spend his time anymore.  He has been increasingly anxious and his hands are shaking today.  He is trying very hard to stop smoking and is using a NicoDerm patch.  He is down to 6 cigarettes a day.  This may be part of his anxiety.  He only uses the clonazepam sporadically and I told him it would help if he used it 3 times a day as directed and it would help bring the shaking down.  Just to be on the safe side we can recheck his Depakote level.  He is losing weight again.  He is seeing  [name] for primary care and I urged him to talk to him about the weight loss.  So far it has only been a few pounds Visit Diagnosis:    ICD-10-CM   1. Moderate mixed bipolar I disorder (HCC) F31.62 Valproic Acid level  2. 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 Acute blood loss anemia 02/07/2017  . Anxiety   . Arthritis    deg disease, bulging disk,  shoulder level  . Bipolar disorder (HCC)   . Bipolar disorder (HCC)   . 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 Depression    anxiety  . Hyperlipidemia   . Hypertension   . Peptic ulcer disease    Review   . Pneumonia   . [name] (peripheral vascular disease) (HCC) 06/18/2015  . Shortness of breath     Past Surgical History:  Procedure Laterality Date  . BIOPSY  02/09/2017   Procedure: BIOPSY;  Surgeon: [name], MD;  Location: [facility];  Service: Endoscopy;;  duodenal gastric  . COLONOSCOPY  03/14/2007   [id number] colonoscopy and terminal ileoscopy except external hemorrhoids  . COLONOSCOPY N/A 05/05/2013   [name]: external/internal anal canal hemorrhoids, unable to intubate TI, segemental biopsies unremarkable   . COLONOSCOPY N/A 04/11/2017   Procedure: COLONOSCOPY;  Surgeon: [name], MD;  Location: [facility];  Service: Endoscopy;  Laterality: N/A;  . COLONOSCOPY WITH PROPOFOL N/A 03/31/2017   Procedure: COLONOSCOPY WITH PROPOFOL;  Surgeon: [name], MD;  Location: [facility];  Service: Endoscopy;  Laterality: N/A;  1:45pm  . ESOPHAGOGASTRODUODENOSCOPY  03/14/2007   [id number] antral gastritis with bulbar duodenitis/paucity to postbulbar duodenal folds and biopsy were benign with no evidence of villous atrophy.  . ESOPHAGOGASTRODUODENOSCOPY (EGD) WITH PROPOFOL N/A 02/09/2017   Procedure: ESOPHAGOGASTRODUODENOSCOPY (EGD) WITH PROPOFOL;  Surgeon: [name], MD;  [facility];  Service: Endoscopy;  Laterality: N/A;  . GIVENS CAPSULE STUDY N/A 04/08/2017   Procedure: GIVENS CAPSULE STUDY;  Surgeon: [name], MD;  Location: [facility];  Service: Endoscopy;  Laterality: N/A;  . HAND SURGERY     left, secondary to self-inflicted laceration  . HEMORRHOID SURGERY N/A 04/14/2017   Procedure: HEMORRHOIDECTOMY;  Surgeon: [name], MD;  Location: [facility];  Service: General;  Laterality: N/A;  . SHOULDER SURGERY     right  . TOE SURGERY     left great toe , amputated-lawnmover accident    Family Psychiatric History: See below  Family History:  Family History  Problem Relation Age of Onset  . Breast cancer Mother        deceased  .  Heart disease Father   . Depression Daughter   . Anxiety disorder Daughter   . Anxiety disorder Son   . Depression Son   . Asthma Brother   . Heart attack Maternal Aunt   . Heart attack Maternal Uncle   . Heart attack Paternal Aunt   . Heart attack Paternal Uncle   . Heart attack Maternal Grandmother   . Heart attack Maternal Grandfather   . Emphysema Maternal Grandfather   . Heart attack Paternal Grandmother   . Heart attack Paternal Grandfather   . Colon cancer Neg Hx   . Liver disease Neg Hx     Social History:  Social History   Socioeconomic  History  . Marital status: Divorced    Spouse name: None  . Number of children: 2  . Years of education: None  . Highest education level: None  Social Needs  . Financial resource strain: None  . Food insecurity - worry: None  . Food insecurity - inability: None  . Transportation needs - medical: None  . Transportation needs - non-medical: None  Occupational History  . Occupation: Disabled  Tobacco Use  . Smoking status: Current Every Day Smoker    Packs/day: 0.50    Years: 40.00    Pack years: 20.00    Types: Cigarettes    Start date: 08/04/1971  . Smokeless tobacco: Never Used  . Tobacco comment: peak rate of 2.5ppd, 1/2ppd on 11/27/2016  Substance and Sexual Activity  . Alcohol use: No    Alcohol/week: 0.0 oz  . Drug use: Yes    Types: Marijuana    Comment: most days   . Sexual activity: No  Other Topics Concern  . None  Social History Narrative   Originally from [place]. Previously has lived in [facility] [place] 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 Questionable asbestos exposure. Does have significant exposure to fumes. No mold exposure. No bird exposure. No pets currently.     Allergies:  Allergies  Allergen Reactions  . Augmentin [Amoxicillin-Pot Clavulanate] Rash and Other (See Comments)    Has patient had a PCN reaction causing immediate rash,  facial/tongue/throat swelling, SOB or lightheadedness with hypotension: No Has patient had a PCN reaction causing severe rash involving mucus membranes or skin necrosis: No Has patient had a PCN reaction that required hospitalization No Has patient had a PCN reaction occurring within the last 10 years: Yes If all of the above answers are "NO", then may proceed with Cephalosporin use.  . Ace Inhibitors Hives    Metabolic Disorder Labs: No results found for: HGBA1C, MPG No results found for: PROLACTIN No results found for: CHOL, TRIG, HDL, CHOLHDL, VLDL, LDLCALC   Therapeutic Level Labs: No results found for: LITHIUM Lab Results  Component Value Date   VALPROATE 68.1 05/24/2017   VALPROATE 58.8 05/20/2015   No components found for:  CBMZ  Current Medications: Current Outpatient Medications  Medication Sig Dispense Refill  . acetaminophen (TYLENOL) 500 MG tablet Take 500-1,000 mg by mouth every 6 (six) hours as needed (FOR HEADACHES.).    Marland Kitchen albuterol (PROAIR HFA) 108 (90 Base) MCG/ACT inhaler Inhale 2 puffs into the lungs every 4 (four) hours as needed for wheezing or shortness of breath. 1 Inhaler 3  . albuterol (PROVENTIL) (2.5 MG/3ML) 0.083% nebulizer solution Take 3 mLs (2.5 mg total) by nebulization every 4 (four) hours as needed for shortness of breath. 120 mL 3  . aspirin EC 81 MG tablet Take 81 mg by mouth every evening.     Marland Kitchen atorvastatin (LIPITOR) 20 MG tablet Take 20 mg by mouth daily.     . budesonide-formoterol (SYMBICORT) 160-4.5 MCG/ACT inhaler Inhale 2 puffs into the lungs 2 (two) times daily. 10.2 g 0  . clonazePAM (KLONOPIN) 0.5 MG tablet Take 1 tablet (0.5 mg total) by mouth 3 (three) times daily as needed for anxiety. 90 tablet 2  . divalproex (DEPAKOTE) 250 MG DR tablet Take 3 tablets (750 mg total) by mouth at bedtime. 270 tablet 2  . fenofibrate micronized (LOFIBRA) 134 MG capsule Take 134 mg by mouth daily before breakfast.     .  FLUoxetine (PROZAC) 40 MG  capsule Take 1 capsule (40 mg total) by mouth every morning. 90 capsule 2  . OLANZapine (ZYPREXA) 5 MG tablet Take 1 tablet (5 mg total) by mouth at bedtime. 90 tablet 2  . temazepam (RESTORIL) 30 MG capsule Take 1 capsule (30 mg total) by mouth at bedtime as needed for sleep. 30 capsule 2  . tiotropium (SPIRIVA) 18 MCG inhalation capsule Place 1 capsule (18 mcg total) into inhaler and inhale daily. 30 capsule 3   No current facility-administered medications for this visit.      Musculoskeletal: Strength & Muscle Tone: within normal limits Gait & Station: normal Patient leans: N/A  Psychiatric Specialty Exam: Review of Systems  Constitutional: Positive for weight loss.  Respiratory: Positive for shortness of breath.   Neurological: Positive for tremors.  Psychiatric/Behavioral: The patient is nervous/anxious.     Blood pressure 134/85, pulse 93, height 5\' 10"  (1.778 m), weight 136 lb (61.7 kg), SpO2 97 %.Body mass index is 19.51 kg/m.  General Appearance: Casual and Fairly Groomed  Eye Contact:  Good  Speech:  Clear and Coherent  Volume:  Decreased  Mood:  Anxious  Affect:  Constricted  Thought Process:  Goal Directed  Orientation:  Full (Time, Place, and Person)  Thought Content: Rumination   Suicidal Thoughts:  No  Homicidal Thoughts:  No  Memory:  Immediate;   Good Recent;   Fair Remote;   Poor  Judgement:  Fair  Insight:  Lacking  Psychomotor Activity:  Tremor  Concentration:  Concentration: Poor and Attention Span: Poor  Recall:  [employer]Fair  Fund of Knowledge: Fair  Language: Good  Akathisia:  No  Handed:  Right  AIMS (if indicated): not done  Assets:  Communication Skills Desire for Improvement Resilience Social Support Talents/Skills  ADL's:  Intact  Cognition: WNL  Sleep:  Fair   Screenings:   Assessment and Plan: This patient is a 57 year old male with a history of depression anxiety mood swings.  His anxiety seems much worse now that he is not able to  work.  He has a tremor today and to be on the safe side we will check a Depakote level.  He has been urged to take the clonazepam 0.5 mg 3 times a day to help with the tremor.  He will continue Restoril 30 mill grams at bedtime for sleep, olanzapine 5 mg at bedtime for mood stabilization, Prozac 40 mg daily for depression, Depakote 750 mg at bedtime for mood stabilization.  He will return to see me in 6 weeks.  Counseling here was offered but he declined   Diannia Rudereborah Samarrah Tranchina, MD 01/11/2018, 1:29 PM

## 2018-01-25 ENCOUNTER — Ambulatory Visit: Payer: Medicare Other | Admitting: Adult Health

## 2018-01-25 ENCOUNTER — Encounter: Payer: Self-pay | Admitting: Adult Health

## 2018-01-25 VITALS — BP 122/68 | HR 66 | Ht 70.0 in | Wt 142.0 lb

## 2018-01-25 DIAGNOSIS — J449 Chronic obstructive pulmonary disease, unspecified: Secondary | ICD-10-CM

## 2018-01-25 DIAGNOSIS — F17200 Nicotine dependence, unspecified, uncomplicated: Secondary | ICD-10-CM

## 2018-01-25 MED ORDER — IPRATROPIUM BROMIDE 0.02 % IN SOLN: 0.5000 mg | Freq: Three times a day (TID) | RESPIRATORY_TRACT | 11 refills | Status: DC

## 2018-01-25 NOTE — Patient Instructions (Addendum)
Begin Ipratropium Neb Three times a day   Continue Symbicort 2 puffs Twice daily  , rinse after use.  Work on not smoking Follow up with Dr. Delton CoombesByrum  In 4 months and As needed

## 2018-01-25 NOTE — Progress Notes (Signed)
@Patient  ID: Jeffrey [name], male    DOB: [birth year], 57 y.o.   MRN: [id number]  Chief Complaint  Patient presents with  . Follow-up    COPD     Referring provider: Gareth Morgan, MD  HPI: 57 yo male smoker followed for COPD    01/25/2018 Follow up : COPD  Patient presents for a 71-month follow-up.  Patient has underlying severe COPD.  Patient continues smoke smoking cessation discussed.  He is trying to cut back on smoking.  Remains on Symbicort Twice daily  . Unable to afford Spiriva . Discussed changing to nebs.  Denies chest pain orthopnea PND or increased leg swelling Had low-dose CT screening last month that showed benign appearance with recommendations to follow-up in 1 year.  Allergies  Allergen Reactions  . Augmentin [Amoxicillin-Pot Clavulanate] Rash and Other (See Comments)    Has patient had a PCN reaction causing immediate rash, facial/tongue/throat swelling, SOB or lightheadedness with hypotension: No Has patient had a PCN reaction causing severe rash involving mucus membranes or skin necrosis: No Has patient had a PCN reaction that required hospitalization No Has patient had a PCN reaction occurring within the last 10 years: Yes If all of the above answers are "NO", then may proceed with Cephalosporin use.  . Ace Inhibitors Hives    Immunization History  Administered Date(s) Administered  . Influenza Whole 08/09/2017  . Influenza,inj,Quad PF,6+ Mos 08/26/2016  . Pneumococcal Polysaccharide-23 10/09/2016    Past Medical History:  Diagnosis Date  . Acute blood loss anemia 02/07/2017  . Anxiety   . Arthritis    deg disease, bulging disk,  shoulder level  . Bipolar disorder (HCC)   . Bipolar disorder (HCC)   . 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 Depression    anxiety  . Hyperlipidemia   . Hypertension   . Peptic ulcer disease    Review  . Pneumonia   . [name] (peripheral vascular disease) (HCC) 06/18/2015  . Shortness of breath     Tobacco  History: Social History   Tobacco Use  Smoking Status Current Every Day Smoker  . Years: 40.00  . Types: Cigarettes  . Start date: 08/04/1971  Smokeless Tobacco Never Used  Tobacco Comment   peak rate of 2.5ppd, 1/2ppd on 11/27/2016 -- 6 cigarettes / day 01/25/18   Ready to quit: Yes Counseling given: Yes Comment: peak rate of 2.5ppd, 1/2ppd on 11/27/2016 -- 6 cigarettes / day 01/25/18   Outpatient Encounter Medications as of 01/25/2018  Medication Sig  . acetaminophen (TYLENOL) 500 MG tablet Take 500-1,000 mg by mouth every 6 (six) hours as needed (FOR HEADACHES.).  Marland Kitchen albuterol (PROAIR HFA) 108 (90 Base) MCG/ACT inhaler Inhale 2 puffs into the lungs every 4 (four) hours as needed for wheezing or shortness of breath.  Marland Kitchen albuterol (PROVENTIL) (2.5 MG/3ML) 0.083% nebulizer solution Take 3 mLs (2.5 mg total) by nebulization every 4 (four) hours as needed for shortness of breath.  Marland Kitchen atorvastatin (LIPITOR) 20 MG tablet Take 20 mg by mouth daily.   . budesonide-formoterol (SYMBICORT) 160-4.5 MCG/ACT inhaler Inhale 2 puffs into the lungs 2 (two) times daily.  . clonazePAM (KLONOPIN) 0.5 MG tablet Take 1 tablet (0.5 mg total) by mouth 3 (three) times daily as needed for anxiety.  . divalproex (DEPAKOTE) 250 MG DR tablet Take 3 tablets (750 mg total) by mouth at bedtime.  . fenofibrate micronized (LOFIBRA) 134 MG capsule Take 134 mg by mouth daily before breakfast.   . FLUoxetine (PROZAC) 40  MG capsule Take 1 capsule (40 mg total) by mouth every morning.  Marland Kitchen OLANZapine (ZYPREXA) 5 MG tablet Take 1 tablet (5 mg total) by mouth at bedtime.  . temazepam (RESTORIL) 30 MG capsule Take 1 capsule (30 mg total) by mouth at bedtime as needed for sleep.  Marland Kitchen aspirin EC 81 MG tablet Take 81 mg by mouth every evening.   . tiotropium (SPIRIVA) 18 MCG inhalation capsule Place 1 capsule (18 mcg total) into inhaler and inhale daily. (Patient not taking: Reported on 01/25/2018)   No facility-administered encounter  medications on file as of 01/25/2018.      Review of Systems  Constitutional:   No  weight loss, night sweats,  Fevers, chills,  +fatigue, or  lassitude.  HEENT:   No headaches,  Difficulty swallowing,  Tooth/dental problems, or  Sore throat,                No sneezing, itching, ear ache, nasal congestion, post nasal drip,   CV:  No chest pain,  Orthopnea, PND, swelling in lower extremities, anasarca, dizziness, palpitations, syncope.   GI  No heartburn, indigestion, abdominal pain, nausea, vomiting, diarrhea, change in bowel habits, loss of appetite, bloody stools.   Resp:   No chest wall deformity  Skin: no rash or lesions.  GU: no dysuria, change in color of urine, no urgency or frequency.  No flank pain, no hematuria   MS:  No joint pain or swelling.  No decreased range of motion.  No back pain.    Physical Exam  BP 122/68 (BP Location: Left Arm, Cuff Size: Normal)   Pulse 66   Ht 5\' 10"  (1.778 m)   Wt 142 lb (64.4 kg)   SpO2 97%   BMI 20.37 kg/m   GEN: A/Ox3; pleasant , NAD.  Frail   HEENT:  [place]/AT,  EACs-clear, TMs-wnl, NOSE-clear, THROAT-clear, no lesions, no postnasal drip or exudate noted.   NECK:  Supple w/ fair ROM; no JVD; normal carotid impulses w/o bruits; no thyromegaly or nodules palpated; no lymphadenopathy.    RESP decreased breath sounds in the bases ,  no accessory muscle use, no dullness to percussion  CARD:  RRR, no m/r/g, no peripheral edema, pulses intact, no cyanosis or clubbing.  GI:   Soft & nt; nml bowel sounds; no organomegaly or masses detected.   Musco: Warm bil, no deformities or joint swelling noted.   Neuro: alert, no focal deficits noted.    Skin: Warm, no lesions or rashes    Lab Results:  CBC  ProBNP No results found for: PROBNP  Imaging: Ct Chest Lung Ca Screen Low Dose W/o Cm  Result Date: 01/04/2018 CLINICAL DATA:  Male 57 year old male with 55 pack-year history of smoking. Lung cancer screening. EXAM: CT CHEST  WITHOUT CONTRAST LOW-DOSE FOR LUNG CANCER SCREENING TECHNIQUE: Multidetector CT imaging of the chest was performed following the standard protocol without IV contrast. COMPARISON:  None. FINDINGS: Cardiovascular: The heart size is normal. No pericardial effusion. Coronary artery calcification is evident. Atherosclerotic calcification is noted in the wall of the thoracic aorta. Mediastinum/Nodes: No mediastinal lymphadenopathy. There is no hilar lymphadenopathy. The esophagus has normal imaging features. There is no axillary lymphadenopathy. Lungs/Pleura: Dependent mucus is noted in the trachea. Biapical pleuroparenchymal scarring evident. Centrilobular and paraseptal emphysema noted bilaterally. Diffuse bronchial wall thickening is noted bilaterally. Scattered tiny calcified and noncalcified pulmonary nodules are identified in the lungs bilaterally. Dominant noncalcified nodule is posterior left lower lobe (image 176). No focal  airspace consolidation. No pulmonary edema or pleural effusion. Upper Abdomen: Unremarkable. Musculoskeletal: Bone windows reveal no worrisome lytic or sclerotic osseous lesions. IMPRESSION: 1. Lung-RADS 2, benign appearance or behavior. Continue annual screening with low-dose chest CT without contrast in 12 months. 2.  Emphysema. (ICD10-J43.9) 3.  Aortic Atherosclerois (ICD10-170.0) Electronically Signed   By: Kennith Center M.D.   On: 01/04/2018 16:48     Assessment & Plan:   No problem-specific Assessment & Plan notes found for this encounter.     Rubye Oaks, NP 01/25/2018

## 2018-01-27 NOTE — Assessment & Plan Note (Signed)
Compensated on present regimen  Plan  Patient Instructions  Begin Ipratropium Neb Three times a day   Continue Symbicort 2 puffs Twice daily  , rinse after use.  Work on not smoking Follow up with Dr. Delton CoombesByrum  In 4 months and As needed

## 2018-01-27 NOTE — Assessment & Plan Note (Signed)
Smoking cessation  

## 2018-01-30 ENCOUNTER — Emergency Department (HOSPITAL_COMMUNITY)
Admission: EM | Admit: 2018-01-30 | Discharge: 2018-01-31 | Disposition: A | Discharge status: Other Acute Inpatient Hospital | Payer: Medicare Other | Attending: Emergency Medicine | Admitting: Emergency Medicine

## 2018-01-30 ENCOUNTER — Other Ambulatory Visit: Payer: Self-pay

## 2018-01-30 ENCOUNTER — Encounter (HOSPITAL_COMMUNITY): Payer: Self-pay | Admitting: *Deleted

## 2018-01-30 DIAGNOSIS — R45851 Suicidal ideations: Secondary | ICD-10-CM

## 2018-01-30 DIAGNOSIS — J449 Chronic obstructive pulmonary disease, unspecified: Secondary | ICD-10-CM | POA: Diagnosis not present

## 2018-01-30 DIAGNOSIS — Z7982 Long term (current) use of aspirin: Secondary | ICD-10-CM | POA: Diagnosis not present

## 2018-01-30 DIAGNOSIS — R4182 Altered mental status, unspecified: Secondary | ICD-10-CM | POA: Diagnosis present

## 2018-01-30 DIAGNOSIS — I10 Essential (primary) hypertension: Secondary | ICD-10-CM | POA: Diagnosis not present

## 2018-01-30 DIAGNOSIS — Z79899 Other long term (current) drug therapy: Secondary | ICD-10-CM | POA: Diagnosis not present

## 2018-01-30 DIAGNOSIS — F17210 Nicotine dependence, cigarettes, uncomplicated: Secondary | ICD-10-CM | POA: Diagnosis not present

## 2018-01-30 DIAGNOSIS — F332 Major depressive disorder, recurrent severe without psychotic features: Secondary | ICD-10-CM | POA: Insufficient documentation

## 2018-01-30 DIAGNOSIS — F1910 Other psychoactive substance abuse, uncomplicated: Secondary | ICD-10-CM | POA: Diagnosis not present

## 2018-01-30 DIAGNOSIS — F1010 Alcohol abuse, uncomplicated: Secondary | ICD-10-CM | POA: Diagnosis not present

## 2018-01-30 LAB — CBC WITH DIFFERENTIAL/PLATELET
Basophils Absolute: 0 10*3/uL (ref 0.0–0.1)
Basophils Relative: 1 %
Eosinophils Absolute: 0.1 10*3/uL (ref 0.0–0.7)
Eosinophils Relative: 2 %
HCT: 44.8 % (ref 39.0–52.0)
Hemoglobin: 14.6 g/dL (ref 13.0–17.0)
Lymphocytes Relative: 40 %
Lymphs Abs: 1.8 10*3/uL (ref 0.7–4.0)
MCH: 29 pg (ref 26.0–34.0)
MCHC: 32.6 g/dL (ref 30.0–36.0)
MCV: 89.1 fL (ref 78.0–100.0)
Monocytes Absolute: 0.4 10*3/uL (ref 0.1–1.0)
Monocytes Relative: 9 %
Neutro Abs: 2.2 10*3/uL (ref 1.7–7.7)
Neutrophils Relative %: 48 %
Platelets: 331 10*3/uL (ref 150–400)
RBC: 5.03 MIL/uL (ref 4.22–5.81)
RDW: 13.9 % (ref 11.5–15.5)
WBC: 4.6 10*3/uL (ref 4.0–10.5)

## 2018-01-30 LAB — COMPREHENSIVE METABOLIC PANEL
ALT: 23 U/L (ref 17–63)
AST: 37 U/L (ref 15–41)
Albumin: 3.9 g/dL (ref 3.5–5.0)
Alkaline Phosphatase: 93 U/L (ref 38–126)
Anion gap: 9 (ref 5–15)
BUN: 15 mg/dL (ref 6–20)
CO2: 29 mmol/L (ref 22–32)
Calcium: 9.1 mg/dL (ref 8.9–10.3)
Chloride: 105 mmol/L (ref 101–111)
Creatinine, Ser: 1.13 mg/dL (ref 0.61–1.24)
GFR calc Af Amer: >60 mL/min (ref >60)
GFR calc non Af Amer: >60 mL/min (ref >60)
Glucose, Bld: 71 mg/dL (ref 65–99)
Potassium: 3.5 mmol/L (ref 3.5–5.1)
Sodium: 143 mmol/L (ref 135–145)
Total Bilirubin: 0.5 mg/dL (ref 0.3–1.2)
Total Protein: 7.3 g/dL (ref 6.5–8.1)

## 2018-01-30 MED ORDER — NALOXONE HCL 2 MG/2ML IJ SOSY
1.0000 mg | PREFILLED_SYRINGE | Freq: Once | INTRAMUSCULAR | Status: AC
  Administered 2018-01-30: 1 mg via INTRAVENOUS
  Filled 2018-01-30: qty 2
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
## 2018-01-31 ENCOUNTER — Encounter (HOSPITAL_COMMUNITY): Payer: Self-pay

## 2018-01-31 ENCOUNTER — Inpatient Hospital Stay (HOSPITAL_COMMUNITY)
Admission: AD | Admit: 2018-01-31 | Discharge: 2018-02-08 | DRG: 885 | Disposition: A | Discharge status: Home / Self Care | Payer: Medicare Other | Source: Intra-hospital | Attending: Psychiatry | Admitting: Psychiatry

## 2018-01-31 ENCOUNTER — Other Ambulatory Visit: Payer: Self-pay

## 2018-01-31 DIAGNOSIS — Z79899 Other long term (current) drug therapy: Secondary | ICD-10-CM

## 2018-01-31 DIAGNOSIS — F1220 Cannabis dependence, uncomplicated: Secondary | ICD-10-CM | POA: Diagnosis present

## 2018-01-31 DIAGNOSIS — F332 Major depressive disorder, recurrent severe without psychotic features: Secondary | ICD-10-CM | POA: Diagnosis present

## 2018-01-31 DIAGNOSIS — Z736 Limitation of activities due to disability: Secondary | ICD-10-CM | POA: Diagnosis not present

## 2018-01-31 DIAGNOSIS — Z818 Family history of other mental and behavioral disorders: Secondary | ICD-10-CM | POA: Diagnosis not present

## 2018-01-31 DIAGNOSIS — E7800 Pure hypercholesterolemia, unspecified: Secondary | ICD-10-CM | POA: Diagnosis present

## 2018-01-31 DIAGNOSIS — F419 Anxiety disorder, unspecified: Secondary | ICD-10-CM | POA: Diagnosis present

## 2018-01-31 DIAGNOSIS — T4272XA Poisoning by unspecified antiepileptic and sedative-hypnotic drugs, intentional self-harm, initial encounter: Secondary | ICD-10-CM | POA: Diagnosis not present

## 2018-01-31 DIAGNOSIS — I739 Peripheral vascular disease, unspecified: Secondary | ICD-10-CM | POA: Diagnosis present

## 2018-01-31 DIAGNOSIS — I10 Essential (primary) hypertension: Secondary | ICD-10-CM | POA: Diagnosis present

## 2018-01-31 DIAGNOSIS — F1910 Other psychoactive substance abuse, uncomplicated: Secondary | ICD-10-CM | POA: Diagnosis not present

## 2018-01-31 DIAGNOSIS — R451 Restlessness and agitation: Secondary | ICD-10-CM | POA: Diagnosis not present

## 2018-01-31 DIAGNOSIS — J449 Chronic obstructive pulmonary disease, unspecified: Secondary | ICD-10-CM | POA: Diagnosis present

## 2018-01-31 DIAGNOSIS — G4700 Insomnia, unspecified: Secondary | ICD-10-CM | POA: Diagnosis present

## 2018-01-31 DIAGNOSIS — F10239 Alcohol dependence with withdrawal, unspecified: Secondary | ICD-10-CM | POA: Diagnosis not present

## 2018-01-31 DIAGNOSIS — T424X2A Poisoning by benzodiazepines, intentional self-harm, initial encounter: Secondary | ICD-10-CM | POA: Diagnosis not present

## 2018-01-31 DIAGNOSIS — F314 Bipolar disorder, current episode depressed, severe, without psychotic features: Secondary | ICD-10-CM

## 2018-01-31 DIAGNOSIS — Z89412 Acquired absence of left great toe: Secondary | ICD-10-CM

## 2018-01-31 DIAGNOSIS — F17210 Nicotine dependence, cigarettes, uncomplicated: Secondary | ICD-10-CM | POA: Diagnosis present

## 2018-01-31 DIAGNOSIS — R450 Nervousness: Secondary | ICD-10-CM | POA: Diagnosis not present

## 2018-01-31 DIAGNOSIS — F1020 Alcohol dependence, uncomplicated: Secondary | ICD-10-CM

## 2018-01-31 DIAGNOSIS — Z6379 Other stressful life events affecting family and household: Secondary | ICD-10-CM | POA: Diagnosis not present

## 2018-01-31 DIAGNOSIS — T1491XA Suicide attempt, initial encounter: Secondary | ICD-10-CM | POA: Diagnosis not present

## 2018-01-31 DIAGNOSIS — T510X2A Toxic effect of ethanol, intentional self-harm, initial encounter: Secondary | ICD-10-CM | POA: Diagnosis not present

## 2018-01-31 LAB — SALICYLATE LEVEL: Salicylate Lvl: <7 mg/dL (ref 2.8–30.0)

## 2018-01-31 LAB — ACETAMINOPHEN LEVEL
Acetaminophen (Tylenol), Serum: <10 ug/mL — ABNORMAL LOW (ref 10–30)
Acetaminophen (Tylenol), Serum: <10 ug/mL — ABNORMAL LOW (ref 10–30)

## 2018-01-31 LAB — ETHANOL: Alcohol, Ethyl (B): 181 mg/dL — ABNORMAL HIGH (ref <10)

## 2018-01-31 LAB — RAPID URINE DRUG SCREEN, HOSP PERFORMED
Amphetamines: NOT DETECTED
Barbiturates: NOT DETECTED
Benzodiazepines: POSITIVE — AB
Cocaine: POSITIVE — AB
Opiates: NOT DETECTED
Tetrahydrocannabinol: POSITIVE — AB

## 2018-01-31 LAB — VALPROIC ACID LEVEL: Valproic Acid Lvl: 14 ug/mL — ABNORMAL LOW (ref 50.0–100.0)

## 2018-01-31 MED ORDER — NICOTINE 14 MG/24HR TD PT24
14.0000 mg | MEDICATED_PATCH | Freq: Every day | TRANSDERMAL | Status: DC
  Administered 2018-02-01: 14 mg via TRANSDERMAL
  Filled 2018-01-31 (×3): qty 1

## 2018-01-31 MED ORDER — FLUOXETINE HCL 20 MG PO CAPS
40.0000 mg | ORAL_CAPSULE | ORAL | Status: DC
  Administered 2018-02-01: 40 mg via ORAL
  Filled 2018-01-31 (×4): qty 2

## 2018-01-31 MED ORDER — VITAMIN B-1 100 MG PO TABS
100.0000 mg | ORAL_TABLET | Freq: Every day | ORAL | Status: DC
  Administered 2018-02-01 – 2018-02-08 (×8): 100 mg via ORAL
  Filled 2018-01-31 (×10): qty 1

## 2018-01-31 MED ORDER — POLYETHYLENE GLYCOL 3350 17 G PO PACK
17.0000 g | PACK | Freq: Once | ORAL | Status: AC
  Administered 2018-01-31: 17 g via ORAL
  Filled 2018-01-31: qty 1

## 2018-01-31 MED ORDER — FLUOXETINE HCL 20 MG PO CAPS: 40.0000 mg | ORAL_CAPSULE | ORAL | Status: DC

## 2018-01-31 MED ORDER — DIVALPROEX SODIUM 250 MG PO DR TAB
750.0000 mg | DELAYED_RELEASE_TABLET | Freq: Every day | ORAL | Status: DC
  Filled 2018-01-31 (×3): qty 3

## 2018-01-31 MED ORDER — LORAZEPAM 1 MG PO TABS: 1.0000 mg | ORAL_TABLET | Freq: Four times a day (QID) | ORAL | Status: DC | PRN

## 2018-01-31 MED ORDER — ACETAMINOPHEN 325 MG PO TABS
650.0000 mg | ORAL_TABLET | Freq: Four times a day (QID) | ORAL | Status: DC | PRN
  Administered 2018-02-01 – 2018-02-08 (×7): 650 mg via ORAL
  Filled 2018-01-31 (×7): qty 2

## 2018-01-31 MED ORDER — MAGNESIUM HYDROXIDE 400 MG/5ML PO SUSP: 30.0000 mL | Freq: Every day | ORAL | Status: DC | PRN

## 2018-01-31 MED ORDER — SODIUM CHLORIDE 0.9 % IV BOLUS (SEPSIS)
1000.0000 mL | Freq: Once | INTRAVENOUS | Status: AC
  Administered 2018-01-31: 1000 mL via INTRAVENOUS

## 2018-01-31 MED ORDER — ALBUTEROL SULFATE (2.5 MG/3ML) 0.083% IN NEBU: 2.5000 mg | INHALATION_SOLUTION | RESPIRATORY_TRACT | Status: DC | PRN

## 2018-01-31 MED ORDER — ATORVASTATIN CALCIUM 20 MG PO TABS
20.0000 mg | ORAL_TABLET | Freq: Every day | ORAL | Status: DC
  Filled 2018-01-31 (×3): qty 1

## 2018-01-31 MED ORDER — LORAZEPAM 1 MG PO TABS: 1.0000 mg | ORAL_TABLET | Freq: Every day | ORAL | Status: DC

## 2018-01-31 MED ORDER — ASPIRIN EC 81 MG PO TBEC
81.0000 mg | DELAYED_RELEASE_TABLET | Freq: Every evening | ORAL | Status: DC
  Administered 2018-02-01 – 2018-02-07 (×7): 81 mg via ORAL
  Filled 2018-01-31 (×9): qty 1
  Filled 2018-01-31: qty 7
  Filled 2018-01-31: qty 1

## 2018-01-31 MED ORDER — ALUM & MAG HYDROXIDE-SIMETH 200-200-20 MG/5ML PO SUSP: 30.0000 mL | ORAL | Status: DC | PRN

## 2018-01-31 MED ORDER — ASPIRIN EC 81 MG PO TBEC: 81.0000 mg | DELAYED_RELEASE_TABLET | Freq: Every evening | ORAL | Status: DC

## 2018-01-31 MED ORDER — LORAZEPAM 1 MG PO TABS
1.0000 mg | ORAL_TABLET | Freq: Three times a day (TID) | ORAL | Status: DC
  Administered 2018-02-02: 1 mg via ORAL
  Filled 2018-01-31: qty 1

## 2018-01-31 MED ORDER — LOPERAMIDE HCL 2 MG PO CAPS: 2.0000 mg | ORAL_CAPSULE | ORAL | Status: AC | PRN

## 2018-01-31 MED ORDER — ADULT MULTIVITAMIN W/MINERALS CH
1.0000 | ORAL_TABLET | Freq: Every day | ORAL | Status: DC
  Administered 2018-02-01 – 2018-02-08 (×8): 1 via ORAL
  Filled 2018-01-31 (×11): qty 1

## 2018-01-31 MED ORDER — LORAZEPAM 1 MG PO TABS: 1.0000 mg | ORAL_TABLET | Freq: Two times a day (BID) | ORAL | Status: DC

## 2018-01-31 MED ORDER — IPRATROPIUM BROMIDE 0.02 % IN SOLN
0.5000 mg | Freq: Three times a day (TID) | RESPIRATORY_TRACT | Status: DC
  Administered 2018-02-01 – 2018-02-07 (×20): 0.5 mg via RESPIRATORY_TRACT
  Filled 2018-01-31 (×29): qty 2.5

## 2018-01-31 MED ORDER — HYDROXYZINE HCL 25 MG PO TABS
25.0000 mg | ORAL_TABLET | Freq: Four times a day (QID) | ORAL | Status: AC | PRN
  Administered 2018-02-01 – 2018-02-03 (×4): 25 mg via ORAL
  Filled 2018-01-31 (×4): qty 1

## 2018-01-31 MED ORDER — OLANZAPINE 5 MG PO TABS: 5.0000 mg | ORAL_TABLET | Freq: Every day | ORAL | Status: DC

## 2018-01-31 MED ORDER — ATORVASTATIN CALCIUM 20 MG PO TABS
20.0000 mg | ORAL_TABLET | Freq: Every day | ORAL | Status: DC
  Administered 2018-02-01 – 2018-02-08 (×8): 20 mg via ORAL
  Filled 2018-01-31 (×3): qty 1
  Filled 2018-01-31: qty 2
  Filled 2018-01-31 (×2): qty 1
  Filled 2018-01-31: qty 2
  Filled 2018-01-31 (×3): qty 1
  Filled 2018-01-31: qty 7
  Filled 2018-01-31: qty 1

## 2018-01-31 MED ORDER — LORAZEPAM 1 MG PO TABS
1.0000 mg | ORAL_TABLET | Freq: Four times a day (QID) | ORAL | Status: AC | PRN
  Administered 2018-02-02: 1 mg via ORAL
  Filled 2018-01-31: qty 1

## 2018-01-31 MED ORDER — ALBUTEROL SULFATE HFA 108 (90 BASE) MCG/ACT IN AERS: 2.0000 | INHALATION_SPRAY | RESPIRATORY_TRACT | Status: DC | PRN

## 2018-01-31 MED ORDER — MOMETASONE FURO-FORMOTEROL FUM 200-5 MCG/ACT IN AERO
2.0000 | INHALATION_SPRAY | Freq: Two times a day (BID) | RESPIRATORY_TRACT | Status: DC
  Administered 2018-02-01 – 2018-02-08 (×15): 2 via RESPIRATORY_TRACT
  Filled 2018-01-31: qty 8.8

## 2018-01-31 MED ORDER — THIAMINE HCL 100 MG/ML IJ SOLN: 100.0000 mg | Freq: Once | INTRAMUSCULAR | Status: DC

## 2018-01-31 MED ORDER — ONDANSETRON 4 MG PO TBDP: 4.0000 mg | ORAL_TABLET | Freq: Four times a day (QID) | ORAL | Status: AC | PRN

## 2018-01-31 MED ORDER — LORAZEPAM 1 MG PO TABS
1.0000 mg | ORAL_TABLET | Freq: Four times a day (QID) | ORAL | Status: AC
  Administered 2018-02-01 – 2018-02-02 (×5): 1 mg via ORAL
  Filled 2018-01-31 (×5): qty 1

## 2018-01-31 MED ORDER — METOPROLOL TARTRATE 50 MG PO TABS
50.0000 mg | ORAL_TABLET | Freq: Two times a day (BID) | ORAL | Status: DC
  Administered 2018-02-01 – 2018-02-08 (×15): 50 mg via ORAL
  Filled 2018-01-31 (×3): qty 1
  Filled 2018-01-31: qty 2
  Filled 2018-01-31 (×4): qty 1
  Filled 2018-01-31: qty 14
  Filled 2018-01-31 (×5): qty 1
  Filled 2018-01-31: qty 14
  Filled 2018-01-31 (×6): qty 1

## 2018-01-31 MED ORDER — DIVALPROEX SODIUM 250 MG PO DR TAB: 750.0000 mg | DELAYED_RELEASE_TABLET | Freq: Every day | ORAL | Status: DC

## 2018-01-31 NOTE — [facility] Counselor (Signed)
Clinician attempted to complete TTS consult however, pt was disoriented and continued to dose off during the assessment. Clinician discussed concerns with Dr. Bebe ShaggyWickline and he reported he will reorder the consult once the pt is orient/alert.   Jeffrey Pullingreylese D Dezzie Badilla, MS, [facility], [facility] Triage Specialist [phone]

## 2018-01-31 NOTE — ED Notes (Signed)
[facility] NP and attending is Dr. Jama Flavorsobos.  Pt can go to 301 bed 2, voluntary, and can send at 5pm.

## 2018-01-31 NOTE — ED Notes (Signed)
Ex WifeSteward Drone- Brenda [phone]
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
## 2018-01-31 NOTE — Progress Notes (Addendum)
Report received from admitting RN.  Pt is high fall risk.  Reviewed fall prevention techniques with pt and pt verbalized understanding.  Fall pads placed on both sides of bed.  Pt agrees to use call bell for assistance prior to trying to get out of bed.  Pt denies SI/HI, hallucinations, withdrawal symptoms, and pain.  Denies needs at this time.  He verbally contracts for safety.  Will continue to monitor and assess.

## 2018-01-31 NOTE — ED Notes (Signed)
[facility]. Pt's belongings given to [place] [facility].

## 2018-01-31 NOTE — ED Notes (Signed)
Pt alert at this time.  C/o still feeling drowsy.  TTS reordered.

## 2018-01-31 NOTE — Progress Notes (Signed)
Jeffrey [name] is a 57 year old male being admitted voluntarily to [facility] from [facility].  He came in with OD on xanax with alcohol.  [facility], he denies SI/HI or A/V hallucinations.  He will contract for safety on the unit.  He denies any recent stressors causing this attempt but has long history or depression/anxiety/substance abuse.  He was made high fall risk due to having fall in the past 6 months and he is unsteady when trying to get up and walk.  Fall risk bracelet and yellow socks given per protocol.  Oriented him to the unit.  Admission paperwork completed and signed.  Belongings searched and secured in locker # 45, no contraband found.  Skin assessment completed and no skin issues noted.  Q 15 minute checks initiated for safety.  We will continue to monitor the progress towards his goals.

## 2018-01-31 NOTE — ED Notes (Signed)
Pt c/o constipation. States he has not had a bowel movement in 3 days. EDP notified.

## 2018-01-31 NOTE — ED Notes (Signed)
Pt sitting up eating breakfast tray 

## 2018-01-31 NOTE — ED Notes (Signed)
Per EDP, pt can be removed off cardiac monitor.

## 2018-01-31 NOTE — Progress Notes (Signed)
[facility].    Oletta Cohnaneka Lewis  NP is the accepting provider.    Dr. Jama Flavorsobos is the attending provider.    Call report to [phone] .   [facility] ED RN notified.     Pt is Voluntary.    Pt may be transported by [facility].   Pt scheduled to arrive at FACILITY after 6pm.  Wells GuilesSarah Delois Silvester, LCSW, LCAS Disposition CSW [phone]

## 2018-01-31 NOTE — Progress Notes (Signed)
Did not attend group 

## 2018-01-31 NOTE — Tx Team (Signed)
Initial Treatment Plan 01/31/2018 8:36 PM Jeffrey GriffonEdward T Nonaka [id number]    PATIENT STRESSORS: Educational concerns Health problems Marital or family conflict Substance abuse Traumatic event   PATIENT STRENGTHS: Capable of independent living Wellsite geologistCommunication skills General fund of knowledge   PATIENT IDENTIFIED PROBLEMS: Depression  Suicidal ideation  Substance abuse  "Be able to get along with my daughter"               DISCHARGE CRITERIA:  Improved stabilization in mood, thinking, and/or behavior Verbal commitment to aftercare and medication compliance Withdrawal symptoms are absent or subacute and managed without 24-hour nursing intervention  PRELIMINARY DISCHARGE PLAN: Outpatient therapy Medication management  PATIENT/FAMILY INVOLVEMENT: This treatment plan has been presented to and reviewed with the patient, Jeffrey Griffondward T [name].  The patient and family have been given the opportunity to ask questions and make suggestions.  Levin BaconHeather V Jamerion Cabello, RN 01/31/2018, 8:36 PM

## 2018-02-01 DIAGNOSIS — Z736 Limitation of activities due to disability: Secondary | ICD-10-CM

## 2018-02-01 DIAGNOSIS — T4272XA Poisoning by unspecified antiepileptic and sedative-hypnotic drugs, intentional self-harm, initial encounter: Secondary | ICD-10-CM

## 2018-02-01 DIAGNOSIS — F17210 Nicotine dependence, cigarettes, uncomplicated: Secondary | ICD-10-CM

## 2018-02-01 DIAGNOSIS — F1020 Alcohol dependence, uncomplicated: Secondary | ICD-10-CM

## 2018-02-01 DIAGNOSIS — Z6379 Other stressful life events affecting family and household: Secondary | ICD-10-CM

## 2018-02-01 DIAGNOSIS — F419 Anxiety disorder, unspecified: Secondary | ICD-10-CM

## 2018-02-01 DIAGNOSIS — Z818 Family history of other mental and behavioral disorders: Secondary | ICD-10-CM

## 2018-02-01 DIAGNOSIS — G4700 Insomnia, unspecified: Secondary | ICD-10-CM

## 2018-02-01 DIAGNOSIS — T510X2A Toxic effect of ethanol, intentional self-harm, initial encounter: Secondary | ICD-10-CM

## 2018-02-01 DIAGNOSIS — R450 Nervousness: Secondary | ICD-10-CM

## 2018-02-01 DIAGNOSIS — T1491XA Suicide attempt, initial encounter: Secondary | ICD-10-CM

## 2018-02-01 DIAGNOSIS — F314 Bipolar disorder, current episode depressed, severe, without psychotic features: Secondary | ICD-10-CM

## 2018-02-01 DIAGNOSIS — F1220 Cannabis dependence, uncomplicated: Secondary | ICD-10-CM

## 2018-02-01 LAB — CBG MONITORING, ED: Glucose-Capillary: 103 mg/dL — ABNORMAL HIGH (ref 65–99)

## 2018-02-01 MED ORDER — FLUOXETINE HCL 20 MG PO CAPS
20.0000 mg | ORAL_CAPSULE | ORAL | Status: DC
  Administered 2018-02-02 – 2018-02-03 (×2): 20 mg via ORAL
  Filled 2018-02-01 (×4): qty 1

## 2018-02-01 MED ORDER — NICOTINE 21 MG/24HR TD PT24
21.0000 mg | MEDICATED_PATCH | Freq: Every day | TRANSDERMAL | Status: DC
  Filled 2018-02-01 (×5): qty 1

## 2018-02-01 MED ORDER — DIVALPROEX SODIUM ER 500 MG PO TB24
1500.0000 mg | ORAL_TABLET | Freq: Every day | ORAL | Status: DC
  Administered 2018-02-01 – 2018-02-07 (×7): 1500 mg via ORAL
  Filled 2018-02-01: qty 21
  Filled 2018-02-01 (×9): qty 3

## 2018-02-01 MED ORDER — ALBUTEROL SULFATE (2.5 MG/3ML) 0.083% IN NEBU
2.5000 mg | INHALATION_SOLUTION | RESPIRATORY_TRACT | Status: DC | PRN
  Administered 2018-02-07: 2.5 mg via RESPIRATORY_TRACT
  Filled 2018-02-01 (×2): qty 3

## 2018-02-01 MED ORDER — OLANZAPINE 10 MG PO TABS
10.0000 mg | ORAL_TABLET | Freq: Every day | ORAL | Status: DC
  Administered 2018-02-01 – 2018-02-07 (×7): 10 mg via ORAL
  Filled 2018-02-01: qty 1
  Filled 2018-02-01: qty 7
  Filled 2018-02-01 (×7): qty 1

## 2018-02-01 MED ORDER — ALBUTEROL SULFATE HFA 108 (90 BASE) MCG/ACT IN AERS
2.0000 | INHALATION_SPRAY | RESPIRATORY_TRACT | Status: DC | PRN
  Administered 2018-02-01 – 2018-02-06 (×4): 2 via RESPIRATORY_TRACT
  Filled 2018-02-01: qty 6.7

## 2018-02-01 MED ORDER — GABAPENTIN 300 MG PO CAPS
300.0000 mg | ORAL_CAPSULE | Freq: Three times a day (TID) | ORAL | Status: DC
  Administered 2018-02-01 – 2018-02-06 (×15): 300 mg via ORAL
  Filled 2018-02-01 (×19): qty 1

## 2018-02-01 NOTE — Progress Notes (Signed)
Pt placed on 1:1 observation for safety due to high fall risk.  Gait is weak and unsteady.  Fall pads on both sides of bed.  Earlier, pt agreed to notify staff via call bell before trying to get out of bed.  MHT witnessed pt going from restroom to bed without assistance or using call bell.  Pt may use wheelchair as needed for ambulation.  He is safe on the unit with 1:1 sitter.  Will continue to monitor and assess.

## 2018-02-01 NOTE — [facility] Suicide Risk Assessment (Signed)
[facility] INPATIENT:  Family/Significant Other Suicide Prevention Education  Suicide Prevention Education:  Contact Attempts: Stefon Ramthun (pt's daughter) [phone] has been identified by the patient as the family member/significant other with whom the patient will be residing, and identified as the person(s) who will aid the patient in the event of a mental health crisis.  With written consent from the patient, two attempts were made to provide suicide prevention education, prior to and/or following the patient's discharge.  We were unsuccessful in providing suicide prevention education.  A suicide education pamphlet was given to the patient to share with family/significant other.  Date and time of first attempt: 02/01/18 at 1:11PM. Unable to leave voicemail on this number.  Date and time of second attempt: 02/02/18 at 1:49PM  [employer]  LCSW 02/01/2018, 1:11 PM

## 2018-02-01 NOTE — [facility] Suicide Risk Assessment (Signed)
[facility] Admission Suicide Risk Assessment   Nursing information obtained from:  Patient Demographic factors:  Male, Divorced or widowed, Caucasian, Living alone, Access to firearms Current Mental Status:  NA Loss Factors:  Loss of significant relationship, Decline in physical health Historical Factors:  Prior suicide attempts, Family history of suicide, Family history of mental illness or substance abuse Risk Reduction Factors:  NA  Total Time spent with patient: 1 hour Principal Problem: Cannabis use disorder, severe, dependence (HCC) Diagnosis:   Patient Active Problem List   Diagnosis Date Noted  . Bipolar disorder with severe depression (HCC) [F31.4] 02/01/2018  . Alcohol use disorder, severe, dependence (HCC) [F10.20] 02/01/2018  . Cannabis use disorder, severe, dependence (HCC) [F12.20] 02/01/2018  . MDD (major depressive disorder), recurrent episode, severe (HCC) [F33.2] 01/31/2018  . Internal and external bleeding hemorrhoids [K64.4, K64.8]   . Rectal bleeding [K62.5]   . Iron deficiency anemia due to chronic blood loss [D50.0] 03/09/2017  . Anemia [D64.9]   . GIB (gastrointestinal bleeding) [K92.2] 02/07/2017  . Acute blood loss anemia [D62] 02/07/2017  . Weakness generalized [R53.1] 02/07/2017  . Dizziness [R42] 02/07/2017  . Peptic ulcer disease [K27.9]   . Bipolar disorder (HCC) [F31.9]   . 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 Dyspnea [R06.00] 08/26/2016  . H/O: depression [Z86.59] 08/26/2016  . Dyslipidemia [E78.5] 08/26/2016  . Tobacco use disorder [F17.200] 08/26/2016  . [name] (peripheral vascular disease) (HCC) [I73.9] 06/18/2015  . Chronic diarrhea [K52.9] 05/02/2013  . Hematochezia [K92.1] 05/02/2013  . Abnormal weight loss [R63.4] 05/02/2013  . Abdominal pain, periumbilical [R10.33] 05/02/2013   Subjective Data:   Jeffrey [name] is a 57 y/o M with history of bipolar disorder, cannabis use, and alcohol use disorder who was admitted from [facility] ED where he had presented after attempting suicide via overdose of temazepam and alcohol. Pt reported that he had consumed an entire bottle of 30 tablets of temazepam *30mg  and drank about 1 pint of hard liquor in an attempt to end his life. He was brought to ED by family, and he was sedated to degree that majority of history was provided by family. Pt was medically stabilized and then transferred to [facility] for additional treatment and stabilization.   Upon initial evaluation, pt was asked why he came to the hospital, and he replied, "It was suicide- that was the number one reason. My daughter was just on the phone saying I shouldn't go back to my first wife, and I didn't know what to do. I couldn't take it any more so I took the whole bottle of my sleeping pills and used alcohol to wash it down because it said not to combine alcohol and the pills on the bottle." Pt reports he has been having SI without plan for the past 10 years and he has attempted about 5-6 times via cutting his wrist, overdose, and jumping from a 15-foot-tall building. He identifies stressors of strained relationship between his children from 2 different marriages. Pt identifies depression symptoms of poor sleep with initial insomnia, guilty feelings, low energy, poor motivation, poor concentration, and fluctuant appetite. He denies current symptoms of mania, but he endorses mood swings and mixed symptoms in the past. He denies symptoms of OCD and PTSD. He endorses use of cannabis multiple times per day and about 1/4 packs per day of cigarettes. He also rarely drinks homemade hard alcohol. He denies other illicit substance use (UDS + for cocaine, benzodiazepines, and THC).  Discussed with patient  about treatment options. He follows with [name] in [place], and he has been taking Depakote ER 1500mg  qhs (level was 14 on 01/30/18), prozac 40mg  qDay, temazepam 30mg  qhs, olanzapine 5mg  qhs, and klonopin 0.5mg  TID. Discussed with patient  about eliminating benzodiazepines from his regimen and replacing with medications which do not have a habit-forming potential and he was in agreement. Pt agreed to DC klonopin and temazepam. He will be restarted on depakote and prozac will be reduced to a lower dose of 20mg  qDay. He has already been started on CIWA with ativan, but he denies regularly using alcohol. He agrees to trial of gabapentin for anxiety. He will also start trial of vistaril for anxiety and insomnia symptoms. He will also have home dose of olanzapine increased to address mood symptoms and insomnia. Pt was in agreement with the above plan, and he had no further questions, comments, or concerns.  Continued Clinical Symptoms:  Alcohol Use Disorder Identification Test Final Score (AUDIT): 18 The "Alcohol Use Disorders Identification Test", Guidelines for Use in Primary Care, Second Edition.  World Science writer [facility]). Score between 0-7:  no or low risk or alcohol related problems. Score between 8-15:  moderate risk of alcohol related problems. Score between 16-19:  high risk of alcohol related problems. Score 20 or above:  warrants further diagnostic evaluation for alcohol dependence and treatment.   CLINICAL FACTORS:   Severe Anxiety and/or Agitation Bipolar Disorder:   Depressive phase Alcohol/Substance Abuse/Dependencies More than one psychiatric diagnosis Medical Diagnoses and Treatments/Surgeries   Musculoskeletal: Strength & Muscle Tone: within normal limits Gait & Station: normal Patient leans: N/A  Psychiatric Specialty Exam: Physical Exam  Nursing note and vitals reviewed.   Review of Systems  Constitutional: Negative for chills and fever.  Respiratory: Negative for cough and shortness of breath.   Cardiovascular: Negative for chest pain.  Gastrointestinal: Negative for abdominal pain, heartburn, nausea and vomiting.  Psychiatric/Behavioral: Positive for depression, substance abuse and suicidal  ideas. Negative for hallucinations. The patient is nervous/anxious and has insomnia.     Blood pressure (!) 145/76, pulse 75, temperature 99 F (37.2 C), temperature source Oral, resp. rate 18, height 5\' 7"  (1.702 m), weight 60.3 kg (133 lb).Body mass index is 20.83 kg/m.  General Appearance: Casual and Fairly Groomed  Eye Contact:  Good  Speech:  Clear and Coherent and Normal Rate  Volume:  Normal  Mood:  Anxious and Depressed  Affect:  Appropriate, Congruent and Constricted  Thought Process:  Coherent and Goal Directed  Orientation:  Full (Time, Place, and Person)  Thought Content:  Logical  Suicidal Thoughts:  Yes.  without intent/plan  Homicidal Thoughts:  No  Memory:  Immediate;   Fair Recent;   Fair Remote;   Fair  Judgement:  Poor  Insight:  Fair  Psychomotor Activity:  Normal  Concentration:  Concentration: Fair  Recall:  [employer] of Knowledge:  Fair  Language:  Fair  Akathisia:  No  Handed:    AIMS (if indicated):     Assets:  Desire for Improvement Resilience  ADL's:  Intact  Cognition:  WNL  Sleep:         COGNITIVE FEATURES THAT CONTRIBUTE TO RISK:  None    SUICIDE RISK:   Moderate:  Frequent suicidal ideation with limited intensity, and duration, some specificity in terms of plans, no associated intent, good self-control, limited dysphoria/symptomatology, some risk factors present, and identifiable protective factors, including available and accessible social support.  PLAN OF CARE:   -  Admit to inpatient level of care  -Bipolar I, current episode depressed   - Restart Depakote ER 1500mg  po qhs   - Change olanzapine 5mg  qhs to olanzapine 10mg  po qhs    - Change prozac 40mg  po qDay to prozac 20mg  po qDay  - Anxiety   - DC klonopin   - Start gabapentin 300mg  po TID   - Start atarax 25mg  po q6h prn anxiety/insomnia  - Insomnia    - DC temazepam   - See above orders for olanzapine and atarax  -Alcohol withdrawal   - Continue CIWA with  ativan  - COPD   - Continue albuterol and atrovent nebulizer   - Continue albuterol inhaler   - Continue dulera 2 puffs BID  -Clot prevention   - Continue aspirin 81mg  qhs  - [name]   - Continue lipitor 20mg  po qDay  - HTN   - Continue lopressor 50mg  po BID  -Encourage participation in groups and therapeutic milieu   - Discharge planning will be ongoing  I certify that inpatient services furnished can reasonably be expected to improve the patient's condition.   Micheal Likenshristopher T Caelin Rosen, MD 02/01/2018, 4:48 PM

## 2018-02-01 NOTE — [facility] Group Notes (Signed)
[facility] Group Therapy 02/01/2018 1:15pm  Type of Therapy: Mental Health Association Presentation  Participation Level: Active  Participation Quality: Attentive  Affect: Appropriate  Cognitive: Oriented  Insight: Developing/Improving  Engagement in Therapy: Engaged  Modes of Intervention: Discussion, Education and Socialization  Summary of Progress/Problems: Mental Health Association (MHA) Speaker came to talk about his personal journey with mental health. The pt processed ways by which to relate to the speaker. MHA speaker provided handouts and educational information pertaining to groups and services offered by the [facility]. Pt was engaged in speaker's presentation and was receptive to resources provided.    Pulte HomesHeather N Smart, LCSW 02/01/2018 1:40 PM

## 2018-02-01 NOTE — [facility] Group Notes (Signed)
[facility] Group Notes:  (Nursing/MHT/Case Management/Adjunct)  Date:  02/01/2018  Time:  3:15 p.m.  Type of Therapy:  Nurse Education  Participation Level:  Active  Participation Quality:  Appropriate  Affect:  Appropriate  Cognitive:  Appropriate  Insight:  Appropriate  Engagement in Group:  Engaged  Modes of Intervention:  Education   Summary of Progress/Problems:  Patient appropriately participated in group.    Earline MayotteKnight, Luanne Krzyzanowski Shephard 02/01/2018, 4:18 PM

## 2018-02-01 NOTE — Progress Notes (Signed)
Discontinuation of 1:1 per MD order 1655  Patient has been attending groups today.  Patient denied SI and HI, contracts for safety.  Denied A/V hallucinations.  Patient stated he was feeling better this afternoon.  That he no longer has a 1:1 with him.  Respirations even and unlabored.  No signs/symptoms of pain/distress noted on patient's face/body movements.  Safety maintained with 15 minute checks.

## 2018-02-01 NOTE — Progress Notes (Signed)
Discontinuation of 1:1 per MD order. 1455  Patient denied SI and HI, contracts for safety.  Denied A/V hallucinations.  Respirations even and unlabored.  No signs/symptoms of pain/distress noted on patient's face/body movements.  Safety maintained with 15 minute checks.

## 2018-02-01 NOTE — H&P (Addendum)
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  Normal  Concentration:  Concentration: Good and Attention Span: Good  Recall:  Good  Fund of Knowledge:  Fair  Language:  Good  Akathisia:  Negative  Handed:  Right  AIMS (if indicated):     Assets:  Communication Skills Desire for Improvement  ADL's:  Intact  Cognition:  WNL  Sleep: just being admitted.   Treatment Plan Summary: Daily contact with patient to assess and evaluate symptoms and progress in treatment: See Md's SRA & treatment plan.  Observation Level/Precautions:  15 minute checks  Laboratory:  Per ED, BAL 181, UDS (+) for Benzodiazepine, Cocaine & THC  Psychotherapy: Group sessions   Medications: See MAR   Consultations: As needed.   Discharge Concerns: Safety, mood stability.  Estimated LOS: 3-5 days  Other: Admit to the 300-Hall.   Physician Treatment Plan for Primary Diagnosis: Cannabis use disorder, severe, dependence ([place])  Long Term Goal(s): Improvement  in symptoms so as ready for discharge  Short Term Goals: Ability to identify changes in lifestyle to reduce recurrence of condition will improve and Ability to demonstrate self-control will improve  Physician Treatment Plan for Secondary Diagnosis: Principal Problem:   Cannabis use disorder,  severe, dependence ([facility]) Active Problems:   Bipolar disorder with severe depression ([place])   Alcohol use disorder, severe, dependence ([place])   MDD (major depressive disorder), recurrent episode, severe ([place])  Long Term Goal(s): Improvement in symptoms so as ready for discharge  Short Term Goals: Ability to identify and develop effective coping behaviors will improve and Compliance with prescribed medications will improve  I certify that inpatient services furnished can reasonably be expected to improve the patient's condition.    Lindell Spar, NP, PMHNP, FNP-BC. 3/12/20191:57 PM   I have reviewed NP's Note, assessement, diagnosis and plan, and agree. I have also met with patient and completed suicide risk assessment.   Ward "Jeffrey [name]" [place] is a 57 y/o M with history of bipolar disorder, cannabis use, and alcohol use disorder who was admitted from [facility] ED where he had presented after attempting suicide via overdose of temazepam and alcohol. Pt reported that he had consumed an entire bottle of 30 tablets of temazepam *45m and drank about 1 pint of hard liquor in an attempt to end his life. He was brought to ED by family, and he was sedated to degree that majority of history was provided by family. Pt was medically stabilized [facility]   Upon initial evaluation, pt was asked why he came to the hospital, and he replied, "It was suicide- that was the number one reason. My daughter was just on the phone saying I shouldn't go back to my first wife, and I didn't know what to do. I couldn't take it any more so I took the whole bottle  of my sleeping pills and used alcohol to wash it down because it said not to combine alcohol and the pills on the bottle." Pt reports he has been having SI without plan for the past 10 years and he has attempted about 5-6 times via cutting his wrist, overdose, and jumping from a 15-foot-tall building. He identifies stressors of strained relationship between his children from 2 different marriages. Pt identifies depression symptoms of poor sleep with initial insomnia, guilty feelings, low energy, poor motivation, poor concentration, and fluctuant appetite. He denies current symptoms of mania, but he endorses mood swings and mixed symptoms in the past. He denies symptoms of OCD and PTSD. He endorses use of cannabis multiple times per day and about 1/4 packs per day of cigarettes. He also rarely drinks homemade hard alcohol. He denies other illicit substance use (UDS + for cocaine, benzodiazepines, and THC).  Discussed with patient about treatment options. He follows with Dr. RHarrington Challenger[facility] and he has been taking Depakote ER 15065mqhs (level was 14 on 01/30/18), prozac 4057mDay, temazepam 89m16ms, olanzapine 5mg 40m, and klonopin 0.5mg T35m Discussed with patient about eliminating benzodiazepines from his regimen and replacing with medications which do not have a habit-forming potential and he was in agreement. Pt agreed to DC klonopin and temazepam. He will be restarted on depakote and prozac will be reduced to a lower dose of 20mg q5m He has already been started on CIWA with ativan, but he denies regularly using alcohol. He agrees to trial of gabapentin for anxiety. He will also start trial of vistaril for anxiety and insomnia symptoms. He will also have home dose of olanzapine increased to address mood symptoms and insomnia. Pt was in agreement with the above plan, and he had no further questions, comments, or concerns.  PLAN OF CARE:   -Admit to  inpatient level of care  -Bipolar I, current  episode depressed             - Restart Depakote ER 1589m po qhs             - Change olanzapine 552mqhs to olanzapine 1026mo qhs             - Change prozac 79m57m qDay to prozac 20mg47mqDay  - Anxiety             - DC klonopin             - Start gabapentin 300mg 31mID             - Start atarax 25mg p2mh prn anxiety/insomnia  - Insomnia             - DC temazepam             - See above orders for olanzapine and atarax  -Alcohol withdrawal             - Continue CIWA with ativan  - COPD             - Continue albuterol and atrovent nebulizer             - Continue albuterol inhaler             - Continue dulera 2 puffs BID  -Clot prevention             - Continue aspirin 81mg qh16m [name]             - Continue lipitor 20mg po 24m  - HTN             - Continue lopressor 50mg po B73m-Encourage participation in groups and therapeutic milieu   - Discharge planning will be ongoing    ChristopheMaris Berger

## 2018-02-01 NOTE — Progress Notes (Signed)
1:1 Note 0815  Patient was sitting on side of bed eating breakfast.  Respirations even and unlabored.  No signs/symptoms of pain/distress noted on patient's face/body movements.  1:1 continues for patient's safety per MD order.

## 2018-02-01 NOTE — Progress Notes (Signed)
Discontinuation of 1:1 MD order 1840  Patient went to dining room for dinner.  Albuterol prn given to patient for coughing.  Patient has visitors tonight.  Patient in hallway talking to visitors.  Safety maintained with 15 minute checks.  Patient has been cooperative and pleasant.

## 2018-02-01 NOTE — Progress Notes (Signed)
[facility] Post 1:1 Observation Documentation  For the first (8) hours following discontinuation of 1:1 precautions, a progress note entry by nursing staff should be documented at least every 2 hours, reflecting the patient's behavior, condition, mood, and conversation.  Use the progress notes for additional entries.  Time 1:1 discontinued:  1452  Patient's Behavior:  Pt is calm and cooperative.  He is currently in group.  Patient's Condition:  Gait is steady.  He complained of back pain and received PRN pain medication.  No further complaints or concerns reported by pt.  Patient's Conversation:  Pt reports his day has been "really good" and he "went to all the meetings" today.  Denies SI/HI and hallucinations.    Arrie AranChurch, Shamira Toutant J 02/01/2018, 8:48 PM

## 2018-02-01 NOTE — Progress Notes (Signed)
[facility] Post 1:1 Observation Documentation  For the first (8) hours following discontinuation of 1:1 precautions, a progress note entry by nursing staff should be documented at least every 2 hours, reflecting the patient's behavior, condition, mood, and conversation.  Use the progress notes for additional entries.  Time 1:1 discontinued:  1452  Patient's Behavior:  Pt is resting in bed with eyes closed.  Patient's Condition:  Respirations are even and unlabored.  No distress noted.  Patient's Conversation:  Pt appears to be sleeping.   Arrie AranChurch, Raoul Ciano J 02/01/2018, 10:55 PM

## 2018-02-01 NOTE — [facility] Counselor (Signed)
Adult Comprehensive Assessment  Patient ID: Jeffrey [name], male   DOB: 1961/08/12, 57 y.o.   MRN: [id number]  Information Source: Information source: Patient  Current Stressors:  Physical health (include injuries & life threatening diseases): COPD and high blood pressure  Living/Environment/Situation:  Living Arrangements: Alone Living conditions (as described by patient or guardian): lives alone in apt  How long has patient lived in current situation?: 4-5 years What is atmosphere in current home: Comfortable, Supportive  Family History:  Marital status: Divorced Divorced, when?: 5 years What types of issues is patient dealing with in the relationship?: alcohol use Additional relationship information: "I'm currently dating."  Are you sexually active?: Yes What is your sexual orientation?: heterosexual Has your sexual activity been affected by drugs, alcohol, medication, or emotional stress?: n/a Does patient have children?: Yes How many children?: 2 How is patient's relationship with their children?: 26yo girl and 16yo girl. "I'm very close to them and see them all the time."   Childhood History:  By whom was/is the patient raised?: Both parents Additional childhood history information: "It was rough growing up with my parents. 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 close to dad Patient's description of current relationship with people who raised him/her: mother deceased. dad is in nursing home and almost 13. "We are okay." How were you disciplined when you got in trouble as a child/adolescent?: whooping/grounded Does patient have siblings?: Yes Number of Siblings: 4 Description of patient's current relationship with siblings: 2 stepbrothers and 2 brothers. My oldest brother died at age 55 and had mental health problems. Did patient suffer any  verbal/emotional/physical/sexual abuse as a child?: No Did patient suffer from severe childhood neglect?: No Has patient ever been sexually abused/assaulted/raped as an adolescent or adult?: No Was the patient ever a victim of a crime or a disaster?: No Witnessed domestic violence?: No Has patient been effected by domestic violence as an adult?: No  Education:  Highest grade of school patient has completed: one year college Currently a Consulting civil engineer?: No Learning disability?: No  Employment/Work Situation:   Employment situation: On disability Why is patient on disability: bipolar disorder and manic depression  How long has patient been on disability: 11 years Patient's job has been impacted by current illness: No What is the longest time patient has a held a job?: 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  Has patient ever been in the [employer]?: No Has patient ever served in combat?: No Did You Receive Any Psychiatric Treatment/Services While in the [employer]?: No Are There Guns or Other Weapons in Your Home?: Yes Types of Guns/Weapons: collectible item  Are These Comptroller?: Yes(thinking about asking daughter to secure for a little bit.)  Financial Resources:   Financial resources: [employer], Medicare Does patient have a Lawyer or guardian?: No  Alcohol/Substance Abuse:   What has been your use of drugs/alcohol within the last 12 months?: marijuana daily-"it helps me calm down." "I might drink a beer a month." "I'm not prescribed xanax anymore."  If attempted suicide, did drugs/alcohol play a role in this?: Yes(I had a personal problem with my daughter and impulsively overdosed. The last time I did that was with my exwife years ago.") Alcohol/Substance Abuse Treatment Hx: Past detox, Past Tx, Outpatient, Past Tx, Inpatient If yes, describe treatment: [facility],  [facility] and "[place]where in [place]  Has alcohol/substance abuse ever caused legal problems?: No  Social Support System:   Conservation officer, natureatient's Community Support System: Good Describe Community Support System: "I have close friends."  Type of faith/religion: Warehouse manager"[employer]." How does patient's faith help to cope with current illness?: "I don't go to church."  Leisure/Recreation:   Leisure and Hobbies: farming; Network engineerdirt bikes  Strengths/Needs:   What things does the patient do well?: intelligent In what areas does patient struggle / problems for patient: "I have a bad temper."   Discharge Plan:   Does patient have access to transportation?: Yes Will patient be returning to same living situation after discharge?: Yes Currently receiving community mental health services: Yes (From Whom)([facility] [place]) If no, would patient like referral for services when discharged?: No Does patient have financial barriers related to discharge medications?: No  Summary/Recommendations:   Summary and Recommendations (to be completed by the evaluator): Patient is 56yo male living in [place], [place] [facility] Patient presents to the hospital seeking treatment for recent overdose on xanax/alcohol. Patient reports that he has been diagnosed with bipolar disorder and has been on disability for this disorder for 11 years. Patient reports daily marijuana use, intermittent xanax use, and very little alcohol use. Patient reports history of trauma and describes PTSD-like symptoms. Patient is divorced, has 2 children, and has long history of tobacco-farming. He states that he has problems with anger but never hurt anyone. He reports that he struggles with sharing his feelings with others. Recommendations for patient include: crisis stabilization, therapeutic milieu, encourage group attendance and participation, medication management for mood stabilization, and development of comprehensive mental wellness/sobriety  plan .CSW assessing. Pt would like to resume medication management services with [facility] in [place] and declined referral for therapy.   Ledell PeoplesHeather N Smart LCSW 02/01/2018 10:55 AM

## 2018-02-01 NOTE — [facility] Group Notes (Signed)
Adult Psychoeducational Group Note  Date:  02/01/2018 Time:  1:04 PM  Group Topic/Focus:  Goals Group:   The focus of this group is to help patients establish daily goals to achieve during treatment and discuss how the patient can incorporate goal setting into their daily lives to aide in recovery.  Participation Level:  Active  Participation Quality:  Appropriate  Affect:  Appropriate  Cognitive:  Alert  Insight: Good  Engagement in Group:  Engaged  Modes of Intervention:  Orientation  Additional Comments:  Pt goal for today is to reach out to his kids and try to build a better relationship with them.  Dellia NimsJaquesha M Maygen Sirico 02/01/2018, 1:04 PM

## 2018-02-01 NOTE — Progress Notes (Addendum)
1:1 Note  1330 Patient has been walking hallway with 1:1.  Ate his lunch, attending groups.  Respirations even and unlabored.  No signs/symptoms of pain/distress noted on patient's face/body movements.  Safety maintained with 15 minute checks.

## 2018-02-01 NOTE — Plan of Care (Signed)
Nurse discussed depression, anxiety, coping skills with patient.  

## 2018-02-01 NOTE — Progress Notes (Signed)
1:1 Note 1130  Patient has been walking in hallway with 1:1.  Patient did attend group this morning.  Respirations even and unlabored.  No signs/symptoms of pain/distress noted on patient's face/body movements.  1:1 continues per MD order for safety.

## 2018-02-02 DIAGNOSIS — T424X2A Poisoning by benzodiazepines, intentional self-harm, initial encounter: Secondary | ICD-10-CM

## 2018-02-02 DIAGNOSIS — R451 Restlessness and agitation: Secondary | ICD-10-CM

## 2018-02-02 NOTE — Plan of Care (Signed)
Jeffrey [name] was forthcoming about his worry when he goes home that he will attempt suicide.  He was able to discuss this with another nurse on the unit.  He currently denies SI.    Jeffrey [name] is more stable on his feet today.  We will continue to monitor the progress towards his goals.

## 2018-02-02 NOTE — [facility] Group Notes (Signed)
LCSW Group Therapy Note 02/02/2018 12:55 PM  Type of Therapy and Topic: Group Therapy: Avoiding Self-Sabotaging and Enabling Behaviors  Participation Level: Active  Description of Group:  In this group, patients will learn how to identify obstacles, self-sabotaging and enabling behaviors, as well as: what are they, why do we do them and what needs these behaviors meet. Discuss unhealthy relationships and how to have positive healthy boundaries with those that sabotage and enable. Explore aspects of self-sabotage and enabling in yourself and how to limit these self-destructive behaviors in everyday life.  Therapeutic Goals: 1. Patient will identify one obstacle that relates to self-sabotage and enabling behaviors 2. Patient will identify one personal self-sabotaging or enabling behavior they did prior to admission 3. Patient will state a plan to change the above identified behavior 4. Patient will demonstrate ability to communicate their needs through discussion and/or role play.   Summary of Patient Progress:  Jeffrey [name] was engaged and participated throughout the group discussion. Jeffrey [name] contributed and states self-sabotaging is "woking hard and getting nothing in return". Jeffrey [name] reports that his self sabotaging behavior is distancing himself (isolating). Jeffrey [name] reports he is now more conscious of his self sabotaging behaviors and hopes to learn new ways to stop.    Therapeutic Modalities:  Cognitive Behavioral Therapy Person-Centered Therapy Motivational Interviewing   Baldo DaubJolan Elfreda Blanchet LCSWA Clinical Social Worker

## 2018-02-02 NOTE — Progress Notes (Signed)
Recreation Therapy Notes  Date: 3.13.19 Time: 9:30 a.m. Location: 300 Hall Dayroom  Group Topic: Stress Management  Goal Area(s) Addresses:  Goal 1.1: To reduce stress  -Patient will report feeling a reduction in stress level  -Patient will identify the importance of stress management  -Patient will participate during stress management group treatment    Behavioral Response: Engaged  Intervention: Stress Management  Activity: Meditattion- Recreation Therapy Intern played a guided meditation video. Patients were in a peaceful environment with soft lighting enhancing patients mood.   Education: Stress Management, Discharge Planning.   Education Outcome: Acknowledges edcuation/In group clarification offered/Needs additional education  Clinical Observations/Feedback:: Patient attended and participated appropriately during stress management group treatment. Patient reported feeling a reduction in stress level.   Markise Haymer, Recreation Therapy Intern   Carri Spillers 02/02/2018 8:03 AM 

## 2018-02-02 NOTE — Progress Notes (Signed)
[facility] MD Progress Note  02/02/2018 3:14 PM Jeffrey [name]  MRN:  [id number]  Subjective: Jeffrey [name] reports, "I feel weak & tired today. I slept well last night, but, I feel more depressed than anything else today. I'm shaky because, I have nerve problems most of my life. My depression is at #8 & anxiety #9. I am just feeling very depressed".  Jeffrey [name] is a 57 y/o M with history of bipolar disorder, cannabis use, and alcohol use disorder who was admitted from [facility] ED where he had presented after attempting suicide via overdose of temazepam and alcohol. Pt reported that he had consumed an entire bottle of 30 tablets of temazepam *30mg  and drank about 1 pint of hard liquor in an attempt to end his life. He was brought to ED by family, and he was sedated to degree that majority of history was provided by family.  02-02-18, Jeffrey [name] is seen, chart reviewed. The chart findings discussed with the treatment team. He is alert, oriented & aware of situation. He is visible on the unit & participating in the group sessions. He presents with a depressed & flat affect today. He is making eye contact & verbally responsive. He reports feeling more depressed today. He is also complaining of his nerve problems & rates his anxiety at #9, depression #8. Jeffrey [name] reports improved sleep. He is instructed that his medications were just adjusted yesterday, as a result, we are not expecting enormous improvement today. However, he is encouraged to be patient, continue to take his medications as we continue to monitor him on daily basis from here on. He denies any SIHI, AVH, delusional thoughts or paranoia. Although, presents with a depressed affect, Jeffrey [name] does not appear to be responding to any internal stimuli. He has agreed to continue current plan of care without any changes.  Principal Problem: Bipolar disorder with severe depression (HCC)  Diagnosis:   Patient Active Problem List   Diagnosis Date Noted  .  Bipolar disorder with severe depression (HCC) [F31.4] 02/01/2018    Priority: High  . Alcohol use disorder, severe, dependence (HCC) [F10.20] 02/01/2018    Priority: Medium  . Cannabis use disorder, severe, dependence (HCC) [F12.20] 02/01/2018    Priority: Medium  . MDD (major depressive disorder), recurrent episode, severe (HCC) [F33.2] 01/31/2018    Priority: Low  . Internal and external bleeding hemorrhoids [K64.4, K64.8]   . Rectal bleeding [K62.5]   . Iron deficiency anemia due to chronic blood loss [D50.0] 03/09/2017  . Anemia [D64.9]   . GIB (gastrointestinal bleeding) [K92.2] 02/07/2017  . Acute blood loss anemia [D62] 02/07/2017  . Weakness generalized [R53.1] 02/07/2017  . Dizziness [R42] 02/07/2017  . Peptic ulcer disease [K27.9]   . Bipolar disorder (HCC) [F31.9]   . 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 Dyspnea [R06.00] 08/26/2016  . H/O: depression [Z86.59] 08/26/2016  . Dyslipidemia [E78.5] 08/26/2016  . Tobacco use disorder [F17.200] 08/26/2016  . [name] (peripheral vascular disease) (HCC) [I73.9] 06/18/2015  . Chronic diarrhea [K52.9] 05/02/2013  . Hematochezia [K92.1] 05/02/2013  . Abnormal weight loss [R63.4] 05/02/2013  . Abdominal pain, periumbilical [R10.33] 05/02/2013   Total Time spent with patient: 25 minutes  Past Psychiatric History: See H&P  Past Medical History:  Past Medical History:  Diagnosis Date  . Acute blood loss anemia 02/07/2017  . Anxiety   . Arthritis    deg disease, bulging disk,  shoulder level  . Bipolar disorder (HCC)   . Bipolar disorder (HCC)   .  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 Depression    anxiety  . Hyperlipidemia   . Hypertension   . Peptic ulcer disease    Review  . Pneumonia   . [name] (peripheral vascular disease) (HCC) 06/18/2015  . Shortness of breath     Past Surgical History:  Procedure Laterality Date  . BIOPSY  02/09/2017   Procedure: BIOPSY;  Surgeon: West BaliSandi L Fields, MD;  Location: [facility];  Service:  Endoscopy;;  duodenal gastric  . COLONOSCOPY  03/14/2007   ZOX:[id number]:Normal colonoscopy and terminal ileoscopy except external hemorrhoids  . COLONOSCOPY N/A 05/05/2013   Dr. Jena Gaussourk: external/internal anal canal hemorrhoids, unable to intubate TI, segemental biopsies unremarkable   . COLONOSCOPY N/A 04/11/2017   Procedure: COLONOSCOPY;  Surgeon: West BaliFields, Sandi L, MD;  Location: [facility];  Service: Endoscopy;  Laterality: N/A;  . COLONOSCOPY WITH PROPOFOL N/A 03/31/2017   Procedure: COLONOSCOPY WITH PROPOFOL;  Surgeon: Corbin Adeourk, Robert M, MD;  Location: [facility];  Service: Endoscopy;  Laterality: N/A;  1:45pm  . ESOPHAGOGASTRODUODENOSCOPY  03/14/2007   WUJ:[id number]:Nonerosive antral gastritis with bulbar duodenitis/paucity to postbulbar duodenal folds and biopsy were benign with no evidence of villous atrophy.  . ESOPHAGOGASTRODUODENOSCOPY (EGD) WITH PROPOFOL N/A 02/09/2017   Procedure: ESOPHAGOGASTRODUODENOSCOPY (EGD) WITH PROPOFOL;  Surgeon: West BaliSandi L Fields, MD;  Location: [facility];  Service: Endoscopy;  Laterality: N/A;  . GIVENS CAPSULE STUDY N/A 04/08/2017   Procedure: GIVENS CAPSULE STUDY;  Surgeon: Corbin Adeourk, Robert M, MD;  Location: [facility];  Service: Endoscopy;  Laterality: N/A;  . HAND SURGERY     left, secondary to self-inflicted laceration  . HEMORRHOID SURGERY N/A 04/14/2017   Procedure: HEMORRHOIDECTOMY;  Surgeon: Franky MachoJenkins, Mark, MD;  Location: [facility];  Service: General;  Laterality: N/A;  . SHOULDER SURGERY     right  . TOE SURGERY     left great toe , amputated-lawnmover accident   Family History:  Family History  Problem Relation Age of Onset  . Breast cancer Mother        deceased  . Heart disease Father   . Depression Daughter   . Anxiety disorder Daughter   . Anxiety disorder Son   . Depression Son   . Asthma Brother   . Heart attack Maternal Aunt   . Heart attack Maternal Uncle   . Heart attack Paternal Aunt   . Heart attack Paternal Uncle   . Heart attack Maternal  Grandmother   . Heart attack Maternal Grandfather   . Emphysema Maternal Grandfather   . Heart attack Paternal Grandmother   . Heart attack Paternal Grandfather   . Colon cancer Neg Hx   . Liver disease Neg Hx    Family Psychiatric  History: See H&P.  Social History:  Social History   Substance and Sexual Activity  Alcohol Use No  . Alcohol/week: 0.0 oz     Social History   Substance and Sexual Activity  Drug Use Yes  . Types: Marijuana   Comment: most days     Social History   Socioeconomic History  . Marital status: Divorced    Spouse name: None  . Number of children: 2  . Years of education: None  . Highest education level: None  Social Needs  . Financial resource strain: None  . Food insecurity - worry: None  . Food insecurity - inability: None  . Transportation needs - medical: None  . Transportation needs - non-medical: None  Occupational History  . Occupation: Disabled  Tobacco Use  . Smoking status: Current Every Day Smoker    Packs/day: 1.00    Years: 40.00    Pack years: 40.00    Types: Cigarettes    Start date: 08/04/1971  . Smokeless tobacco: Never Used  . Tobacco comment: peak rate of 2.5ppd, 1/2ppd on 11/27/2016 -- 6 cigarettes / day 01/25/18  Substance and Sexual Activity  . Alcohol use: No    Alcohol/week: 0.0 oz  . Drug use: Yes    Types: Marijuana    Comment: most days   . Sexual activity: No  Other Topics Concern  . None  Social History Narrative   Originally from [place]. Previously has lived in [facility] [place] 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 Questionable asbestos exposure. Does have significant exposure to fumes. No mold exposure. No bird exposure. No pets currently.    Additional Social History:   Sleep: Good  Appetite:  Good  Current Medications: Current Facility-Administered Medications  Medication Dose Route Frequency Provider Last Rate Last Dose  . acetaminophen  (TYLENOL) tablet 650 mg  650 mg Oral Q6H PRN Kerry Hough, PA-C   650 mg at 02/02/18 1308  . albuterol (PROVENTIL HFA;VENTOLIN HFA) 108 (90 Base) MCG/ACT inhaler 2 puff  2 puff Inhalation Q4H PRN Cobos, Rockey Situ, MD   2 puff at 02/02/18 1002  . albuterol (PROVENTIL) (2.5 MG/3ML) 0.083% nebulizer solution 2.5 mg  2.5 mg Nebulization Q4H PRN Cobos, Rockey Situ, MD      . alum & mag hydroxide-simeth (MAALOX/MYLANTA) 200-200-20 MG/5ML suspension 30 mL  30 mL Oral Q4H PRN Kerry Hough, PA-C      . aspirin EC tablet 81 mg  81 mg Oral QPM Donell Sievert E, PA-C   81 mg at 02/01/18 1723  . atorvastatin (LIPITOR) tablet 20 mg  20 mg Oral Daily Kerry Hough, PA-C   20 mg at 02/02/18 1308  . divalproex (DEPAKOTE ER) 24 hr tablet 1,500 mg  1,500 mg Oral QHS Jimeka Balan I, NP   1,500 mg at 02/01/18 2122  . FLUoxetine (PROZAC) capsule 20 mg  20 mg Oral Jobe Gibbon I, NP   20 mg at 02/02/18 0636  . gabapentin (NEURONTIN) capsule 300 mg  300 mg Oral TID Armandina Stammer I, NP   300 mg at 02/02/18 1205  . hydrOXYzine (ATARAX/VISTARIL) tablet 25 mg  25 mg Oral Q6H PRN Kerry Hough, PA-C   25 mg at 02/02/18 1506  . ipratropium (ATROVENT) nebulizer solution 0.5 mg  0.5 mg Nebulization TID Donell Sievert E, PA-C   0.5 mg at 02/02/18 1158  . loperamide (IMODIUM) capsule 2-4 mg  2-4 mg Oral PRN Kerry Hough, PA-C      . LORazepam (ATIVAN) tablet 1 mg  1 mg Oral Q6H PRN Kerry Hough, PA-C      . LORazepam (ATIVAN) tablet 1 mg  1 mg Oral TID Donell Sievert E, PA-C   1 mg at 02/02/18 1206   Followed by  . [START ON 02/03/2018] LORazepam (ATIVAN) tablet 1 mg  1 mg Oral BID Donell Sievert E, PA-C       Followed by  . [START ON 02/05/2018] LORazepam (ATIVAN) tablet 1 mg  1 mg Oral Daily Simon, Spencer E, PA-C      . magnesium hydroxide (MILK OF MAGNESIA) suspension 30 mL  30 mL Oral Daily PRN Kerry Hough, PA-C      .  metoprolol tartrate (LOPRESSOR) tablet 50 mg  50 mg Oral BID Kerry Hough, PA-C   50 mg at 02/02/18 1610  . mometasone-formoterol (DULERA) 200-5 MCG/ACT inhaler 2 puff  2 puff Inhalation BID Kerry Hough, PA-C   2 puff at 02/02/18 9604  . multivitamin with minerals tablet 1 tablet  1 tablet Oral Daily Kerry Hough, PA-C   1 tablet at 02/02/18 5409  . nicotine (NICODERM CQ - dosed in mg/24 hours) patch 21 mg  21 mg Transdermal Daily Cobos, Fernando A, MD      . OLANZapine (ZYPREXA) tablet 10 mg  10 mg Oral QHS Armandina Stammer I, NP   10 mg at 02/01/18 2122  . ondansetron (ZOFRAN-ODT) disintegrating tablet 4 mg  4 mg Oral Q6H PRN Donell Sievert E, PA-C      . thiamine (B-1) injection 100 mg  100 mg Intramuscular Once Donell Sievert E, PA-C      . thiamine (VITAMIN B-1) tablet 100 mg  100 mg Oral Daily Donell Sievert E, PA-C   100 mg at 02/02/18 8119   Lab Results: No results found for this or any previous visit (from the past 48 hour(s)).  Blood Alcohol level:  Lab Results  Component Value Date   ETH 181 (H) 01/30/2018   ETH  07/20/2007    <5        LOWEST DETECTABLE LIMIT FOR SERUM ALCOHOL IS 11 mg/dL FOR MEDICAL PURPOSES ONLY    Metabolic Disorder Labs: No results found for: HGBA1C, MPG No results found for: PROLACTIN No results found for: CHOL, TRIG, HDL, CHOLHDL, VLDL, LDLCALC  Physical Findings: AIMS: Facial and Oral Movements Muscles of Facial Expression: None, normal Lips and Perioral Area: None, normal Jaw: None, normal Tongue: None, normal,Extremity Movements Upper (arms, wrists, hands, fingers): None, normal Lower (legs, knees, ankles, toes): None, normal, Trunk Movements Neck, shoulders, hips: None, normal, Overall Severity Severity of abnormal movements (highest score from questions above): None, normal Incapacitation due to abnormal movements: None, normal Patient's awareness of abnormal movements (rate only patient's report): No Awareness, Dental Status Current problems with teeth and/or dentures?: No Does patient usually wear  dentures?: No  CIWA:  CIWA-Ar Total: 3 COWS:  COWS Total Score: 1  Musculoskeletal: Strength & Muscle Tone: within normal limits Gait & Station: normal Patient leans: N/A  Psychiatric Specialty Exam: Physical Exam  Review of Systems  Psychiatric/Behavioral: Positive for depression and substance abuse. Negative for hallucinations, memory loss and suicidal ideas. The patient is nervous/anxious. The patient does not have insomnia.     Blood pressure (!) 148/83, pulse 72, temperature 98.1 F (36.7 C), temperature source Oral, resp. rate 18, height 5\' 7"  (1.702 m), weight 60.3 kg (133 lb), SpO2 98 %.Body mass index is 20.83 kg/m.  General Appearance: Casual and Fairly Groomed  Eye Contact:  Good  Speech:  Clear and Coherent and Normal Rate  Volume:  Normal  Mood:  Anxious and Depressed  Affect:  Appropriate, Congruent and Constricted  Thought Process:  Coherent and Goal Directed  Orientation:  Full (Time, Place, and Person)  Thought Content:  Logical  Suicidal Thoughts:  Yes.  without intent/plan  Homicidal Thoughts:  No  Memory:  Immediate;   Fair Recent;   Fair Remote;   Fair  Judgement:  Poor  Insight:  Fair  Psychomotor Activity:  Normal  Concentration:  Concentration: Fair  Recall:  [employer] of Knowledge:  Fair  Language:  Fair  Akathisia:  No  Handed:    AIMS (if indicated):     Assets:  Desire for Improvement Resilience  ADL's:  Intact  Cognition:  WNL  Sleep: 6.75      Treatment Plan Summary: Daily contact with patient to assess and evaluate symptoms and progress in treatment: "Jeffrey [name]" reports worsening symptoms today. He says he is feeling more depressed. Will continue inpatient hospitalization.     - Will continue today 02/02/2018 plan as below except where it is noted.  Mood stabilization.    - Continue Depakote ER 1500 mg po Q hs.  Depression.    - Continue Prozac 20 mg po po daily.  Agitation.    - Continue Gabapentin 300 mg po tid.  Mood  control.     - Olanzapine 10 mg po Q hs.  Smoking cessation.     - Continue Nicotine patch 21 mg topically Q 24 hours.  Other medical issues.     - Continue Albuterol inhaler 108 (90 base) Mcg/Act 2 puffs Q 4 hours prn for SOB.     - Continue ipratropium  Soln 0.5 mg tid for SOB.     - Continue ASA 81 mg po daily for heart health.     - Continue Lipitor 20 mg po Q evenings for high cholesterol.     - Continue Metoprolol tartrate 50 mg po bid for HTN.     - Continue Dulera 200-5 MCG/Act 2 puffs bid for SOB.  Patient to participate in the group counseling sessions. Discharged disposition ongoing.  Armandina Stammer, NP, pmhnp, fnp-Bc 02/02/2018, 3:14 PM

## 2018-02-02 NOTE — Progress Notes (Cosign Needed)
D: Pt attended group and is seen interacting with peers.  Pt was pleasant and compliant with meds.  Pt denies any SI HI and A/V hallucinations. Pt reports some anxiety upon first med pass and looked to be more anxious when he asked for something to help him sleep.  A: Safety enforced with q15 checks. Pt educated about medications.  Snacks and fluids provided.  Obtained BP.  R: When checked on later pt was sound asleep in bed.  Pt remains safe at this time.  Will continue to monitor.

## 2018-02-02 NOTE — Progress Notes (Signed)
D:  Jeffrey [name] has been up and visible on the unit.  He interacts with staff and peers appropriately.  He is pleasant and cooperative.  He denies any SI/HI or A/V hallucinations.  He has been attending groups.  He takes his medications without difficulty.  He has had some difficulty with shortness of breath and back pain.  Ventolin and tylenol given with some relief.  He filled out his self inventory and reported that his depression and hopelessness 7/10 and anxiety is 3/10.  He stated that his goal for today was to "hang around" and he will accomplish this goal by "hang around."   A:  1:1 interaction for support and encouragement.  Medications as ordered.  Encouraged continued participation in group and unit activities.  Q 15 minute checks maintained for safety.   R:  Todd remains safe on the unit.  We will continue to monitor the progress towards his goals.

## 2018-02-02 NOTE — [facility] Group Notes (Signed)
[facility] Group Notes:  (Nursing/MHT/Case Management/Adjunct)  Date:  02/02/2018  Time:  1600  Type of Therapy:  Nurse Education  - Positive Self Talk to Promote Self Esteem  Participation Level:  Active  Participation Quality:  Attentive  Affect:  Anxious  Cognitive:  Alert  Insight:  Improving  Engagement in Group:  Engaged  Modes of Intervention:  Discussion, Education and Support  Summary of Progress/Problems: Patient attended group and was attentive. States he does not feel shame until he buys weed and then feels guilty. Patient spoke to this writer after group and stated, "I just don't know if I went home that I wouldn't take a bunch of pills again." Primary nurse, Herbert SetaHeather, updated.   Jeffrey [name], Jeffrey [name] [facility] 02/02/2018, 4:49 PM

## 2018-02-02 NOTE — Tx Team (Signed)
Interdisciplinary Treatment and Diagnostic Plan Update  02/02/2018 Time of Session: 0830AM Jeffrey [name] MRN: [id number]  Principal Diagnosis: Bipolar disorder with severe depression (HCC)  Secondary Diagnoses: Principal Problem:   Bipolar disorder with severe depression (HCC) Active Problems:   Alcohol use disorder, severe, dependence (HCC)   Cannabis use disorder, severe, dependence (HCC)   Current Medications:  Current Facility-Administered Medications  Medication Dose Route Frequency Provider Last Rate Last Dose  . acetaminophen (TYLENOL) tablet 650 mg  650 mg Oral Q6H PRN Kerry HoughSimon, Spencer E, PA-C   650 mg at 02/01/18 1952  . albuterol (PROVENTIL HFA;VENTOLIN HFA) 108 (90 Base) MCG/ACT inhaler 2 puff  2 puff Inhalation Q4H PRN Cobos, Rockey SituFernando A, MD   2 puff at 02/01/18 1835  . albuterol (PROVENTIL) (2.5 MG/3ML) 0.083% nebulizer solution 2.5 mg  2.5 mg Nebulization Q4H PRN Cobos, Rockey SituFernando A, MD      . alum & mag hydroxide-simeth (MAALOX/MYLANTA) 200-200-20 MG/5ML suspension 30 mL  30 mL Oral Q4H PRN Kerry HoughSimon, Spencer E, PA-C      . aspirin EC tablet 81 mg  81 mg Oral QPM Simon, Spencer E, PA-C   81 mg at 02/01/18 1723  . atorvastatin (LIPITOR) tablet 20 mg  20 mg Oral Daily Kerry HoughSimon, Spencer E, PA-C   20 mg at 02/02/18 [id number]  . divalproex (DEPAKOTE ER) 24 hr tablet 1,500 mg  1,500 mg Oral QHS Nwoko, Agnes I, NP   1,500 mg at 02/01/18 2122  . FLUoxetine (PROZAC) capsule 20 mg  20 mg Oral Jobe GibbonBH-q7a Nwoko, Agnes I, NP   20 mg at 02/02/18 0636  . gabapentin (NEURONTIN) capsule 300 mg  300 mg Oral TID Armandina StammerNwoko, Agnes I, NP   300 mg at 02/02/18 0824  . hydrOXYzine (ATARAX/VISTARIL) tablet 25 mg  25 mg Oral Q6H PRN Kerry HoughSimon, Spencer E, PA-C   25 mg at 02/01/18 1906  . ipratropium (ATROVENT) nebulizer solution 0.5 mg  0.5 mg Nebulization TID Donell SievertSimon, Spencer E, PA-C   0.5 mg at 02/02/18 [id number]  . loperamide (IMODIUM) capsule 2-4 mg  2-4 mg Oral PRN Kerry HoughSimon, Spencer E, PA-C      . LORazepam (ATIVAN) tablet 1 mg  1  mg Oral Q6H PRN Kerry HoughSimon, Spencer E, PA-C      . LORazepam (ATIVAN) tablet 1 mg  1 mg Oral QID Donell SievertSimon, Spencer E, PA-C   1 mg at 02/02/18 [id number]   Followed by  . LORazepam (ATIVAN) tablet 1 mg  1 mg Oral TID Kerry HoughSimon, Spencer E, PA-C       Followed by  . [START ON 02/03/2018] LORazepam (ATIVAN) tablet 1 mg  1 mg Oral BID Donell SievertSimon, Spencer E, PA-C       Followed by  . [START ON 02/05/2018] LORazepam (ATIVAN) tablet 1 mg  1 mg Oral Daily Simon, Spencer E, PA-C      . magnesium hydroxide (MILK OF MAGNESIA) suspension 30 mL  30 mL Oral Daily PRN Donell SievertSimon, Spencer E, PA-C      . metoprolol tartrate (LOPRESSOR) tablet 50 mg  50 mg Oral BID Donell SievertSimon, Spencer E, PA-C   50 mg at 02/02/18 0824  . mometasone-formoterol (DULERA) 200-5 MCG/ACT inhaler 2 puff  2 puff Inhalation BID Kerry HoughSimon, Spencer E, PA-C   2 puff at 02/02/18 [id number]  . multivitamin with minerals tablet 1 tablet  1 tablet Oral Daily Kerry HoughSimon, Spencer E, PA-C   1 tablet at 02/02/18 [id number]  . nicotine (NICODERM CQ - dosed in mg/24 hours) patch 21 mg  21 mg Transdermal Daily Cobos, Rockey Situ, MD      . OLANZapine (ZYPREXA) tablet 10 mg  10 mg Oral QHS Armandina Stammer I, NP   10 mg at 02/01/18 2122  . ondansetron (ZOFRAN-ODT) disintegrating tablet 4 mg  4 mg Oral Q6H PRN Donell Sievert E, PA-C      . thiamine (B-1) injection 100 mg  100 mg Intramuscular Once Donell Sievert E, PA-C      . thiamine (VITAMIN B-1) tablet 100 mg  100 mg Oral Daily Kerry Hough, PA-C   100 mg at 02/02/18 4098   PTA Medications: Medications Prior to Admission  Medication Sig Dispense Refill Last Dose  . albuterol (PROAIR HFA) 108 (90 Base) MCG/ACT inhaler Inhale 2 puffs into the lungs every 4 (four) hours as needed for wheezing or shortness of breath. 1 Inhaler 3 Past Month at Unknown time  . albuterol (PROVENTIL) (2.5 MG/3ML) 0.083% nebulizer solution Take 3 mLs (2.5 mg total) by nebulization every 4 (four) hours as needed for shortness of breath. 120 mL 3 Past Month at Unknown time  . aspirin  EC 81 MG tablet Take 81 mg by mouth every evening.    Past Month at Unknown time  . atorvastatin (LIPITOR) 20 MG tablet Take 20 mg by mouth daily.    Past Week at Unknown time  . budesonide-formoterol (SYMBICORT) 160-4.5 MCG/ACT inhaler Inhale 2 puffs into the lungs 2 (two) times daily. 10.2 g 0 01/31/2018 at Unknown time  . clonazePAM (KLONOPIN) 0.5 MG tablet Take 1 tablet (0.5 mg total) by mouth 3 (three) times daily as needed for anxiety. 90 tablet 2 Past Week at Unknown time  . divalproex (DEPAKOTE) 250 MG DR tablet Take 3 tablets (750 mg total) by mouth at bedtime. 270 tablet 2 01/30/2018 at Unknown time  . FLUoxetine (PROZAC) 40 MG capsule Take 1 capsule (40 mg total) by mouth every morning. 90 capsule 2 01/31/2018 at Unknown time  . ipratropium (ATROVENT) 0.02 % nebulizer solution Take 2.5 mLs (0.5 mg total) by nebulization 3 (three) times daily. 225 mL 11 Past Month at Unknown time  . metoprolol tartrate (LOPRESSOR) 50 MG tablet Take 50 mg by mouth 2 (two) times daily.   01/30/2018 at 2030  . OLANZapine (ZYPREXA) 5 MG tablet Take 1 tablet (5 mg total) by mouth at bedtime. 90 tablet 2 01/30/2018 at Unknown time  . temazepam (RESTORIL) 30 MG capsule Take 1 capsule (30 mg total) by mouth at bedtime as needed for sleep. 30 capsule 2 01/30/2018 at Unknown time    Patient Stressors: Educational concerns Health problems Marital or family conflict Substance abuse Traumatic event  Patient Strengths: Capable of independent living Wellsite geologist fund of knowledge  Treatment Modalities: Medication Management, Group therapy, Case management,  1 to 1 session with clinician, Psychoeducation, Recreational therapy.   Physician Treatment Plan for Primary Diagnosis: Bipolar disorder with severe depression (HCC) Long Term Goal(s): Improvement in symptoms so as ready for discharge Improvement in symptoms so as ready for discharge   Short Term Goals: Ability to identify changes in lifestyle  to reduce recurrence of condition will improve Ability to demonstrate self-control will improve Ability to identify and develop effective coping behaviors will improve Compliance with prescribed medications will improve  Medication Management: Evaluate patient's response, side effects, and tolerance of medication regimen.  Therapeutic Interventions: 1 to 1 sessions, Unit Group sessions and Medication administration.  Evaluation of Outcomes: Progressing  Physician Treatment Plan for Secondary Diagnosis: Principal Problem:  Bipolar disorder with severe depression (HCC) Active Problems:   Alcohol use disorder, severe, dependence (HCC)   Cannabis use disorder, severe, dependence (HCC)  Long Term Goal(s): Improvement in symptoms so as ready for discharge Improvement in symptoms so as ready for discharge   Short Term Goals: Ability to identify changes in lifestyle to reduce recurrence of condition will improve Ability to demonstrate self-control will improve Ability to identify and develop effective coping behaviors will improve Compliance with prescribed medications will improve     Medication Management: Evaluate patient's response, side effects, and tolerance of medication regimen.  Therapeutic Interventions: 1 to 1 sessions, Unit Group sessions and Medication administration.  Evaluation of Outcomes: Progressing   RN Treatment Plan for Primary Diagnosis: Bipolar disorder with severe depression (HCC) Long Term Goal(s): Knowledge of disease and therapeutic regimen to maintain health will improve  Short Term Goals: Ability to remain free from injury will improve, Ability to disclose and discuss suicidal ideas and Ability to identify and develop effective coping behaviors will improve  Medication Management: RN will administer medications as ordered by provider, will assess and evaluate patient's response and provide education to patient for prescribed medication. RN will report any  adverse and/or side effects to prescribing provider.  Therapeutic Interventions: 1 on 1 counseling sessions, Psychoeducation, Medication administration, Evaluate responses to treatment, Monitor vital signs and CBGs as ordered, Perform/monitor CIWA, COWS, AIMS and Fall Risk screenings as ordered, Perform wound care treatments as ordered.  Evaluation of Outcomes: Progressing   LCSW Treatment Plan for Primary Diagnosis: Bipolar disorder with severe depression (HCC) Long Term Goal(s): Safe transition to appropriate next level of care at discharge, Engage patient in therapeutic group addressing interpersonal concerns.  Short Term Goals: Engage patient in aftercare planning with referrals and resources, Facilitate patient progression through stages of change regarding substance use diagnoses and concerns and Identify triggers associated with mental health/substance abuse issues  Therapeutic Interventions: Assess for all discharge needs, 1 to 1 time with Social worker, Explore available resources and support systems, Assess for adequacy in community support network, Educate family and significant other(s) on suicide prevention, Complete Psychosocial Assessment, Interpersonal group therapy.  Evaluation of Outcomes: Progressing   Progress in Treatment: Attending groups: Yes. Participating in groups: Yes. Taking medication as prescribed: Yes. Toleration medication: Yes. Family/Significant other contact made: Contact attempts made with pt's daughter. SPI completed with pt as well.  Patient understands diagnosis: Yes. Discussing patient identified problems/goals with staff: Yes. Medical problems stabilized or resolved: Yes. Denies suicidal/homicidal ideation: Yes. Issues/concerns per patient self-inventory: No. Other: n/a  New problem(s) identified: No, Describe:  n/a  New Short Term/Long Term Goal(s): detox, medication management for mood stabilization; elimination of SI thoughts; development of  comprehensive mental wellness/sobriety plan.   Patient Goal: "To get my medications straightened out and get help with smoking marijuana. I want to stop."   Discharge Plan or Barriers: Pt plans to return home; follow-up with [name] at [facility] in [place]. Pt declined therapy referral. MHAG and AA/NA information provided for additional community support.   Reason for Continuation of Hospitalization: Anxiety Depression Medication stabilization  Estimated Length of Stay: Friday, 02/04/18  Attendees: Patient: Jeffrey [name] 02/02/2018 9:53 AM  Physician: Dr. Altamese [place] Walk MD; Dr. Altamese [place] MD 02/02/2018 9:53 AM  Nursing: Armando Reichert RN; [name] RN  02/02/2018 9:53 AM  RN Care Manager:X 02/02/2018 9:53 AM  Social Worker: [employer], LCSW 02/02/2018 9:53 AM  Recreational Therapist: x 02/02/2018 9:53 AM  Other: Armandina Stammer NP; [employer]  NP 02/02/2018 9:53 AM  Other:  02/02/2018 9:53 AM  Other: 02/02/2018 9:53 AM    Scribe for Treatment Team: Ledell Peoples Smart, LCSW 02/02/2018 9:53 AM

## 2018-02-03 MED ORDER — FLUOXETINE HCL 20 MG PO CAPS
40.0000 mg | ORAL_CAPSULE | ORAL | Status: DC
  Administered 2018-02-04 – 2018-02-08 (×5): 40 mg via ORAL
  Filled 2018-02-03: qty 2
  Filled 2018-02-03: qty 14
  Filled 2018-02-03 (×5): qty 2

## 2018-02-03 MED ORDER — AMLODIPINE BESYLATE 2.5 MG PO TABS
2.5000 mg | ORAL_TABLET | Freq: Every day | ORAL | Status: AC
  Administered 2018-02-03 – 2018-02-05 (×3): 2.5 mg via ORAL
  Filled 2018-02-03 (×3): qty 1

## 2018-02-03 NOTE — Progress Notes (Signed)
Nursing Progress Note [id number]  D) Patient gives verbal consent to work with nursing student this evening. Patient denies SI/HI/AVH or pain. Patient contracts for safety on the unit.  A) Patient safety maintained with q15 min safety checks. High fall risk precautions in place. Patient medicated by nursing student under supervision of RN.  R) Patient remains safe on the unit at this time. Patient agrees to make needs known to staff. Will continue to monitor and assess for changes.

## 2018-02-03 NOTE — Plan of Care (Signed)
  Outcome: Progressing Physical Regulation: Goal: Complications related to the disease process, condition or treatment will be avoided or minimized Interventions: CIWA screening and providing medications. 02/03/2018 0010 - Progressing by Dionne AnoEngle, Adalia Pettis M, Student-RN

## 2018-02-03 NOTE — Plan of Care (Signed)
  Outcome: Medication: Goal: Compliance with prescribed medication regimen will improve Intervention: Pt compliant with med administration and educated on the medications he is taking. 02/03/2018 0013 - Progressing by Dionne AnoEngle, Devonta Blanford M, Student-RN

## 2018-02-03 NOTE — Progress Notes (Signed)
Patient ID: Jeffrey [name], male   DOB: 21-Nov-1961, 57 y.o.   MRN: [id number]  DAR: Pt. Denies SI/HI and A/V Hallucinations. He reports his sleep last night was good, his appetite is fair, his energy level is normal, and his concentration is good. He rates his depression level 4/10, his hopelessness level 5/10, and anxiety level is 4/10. Support and encouragement provided to the patient. Scheduled and PRN medications administered to patient per physician's orders. Patient is receptive and cooperative. He is seen in the milieu and is cooperative with staff. Q15 minute checks are maintained for safety.

## 2018-02-03 NOTE — [facility] Group Notes (Signed)
LCSW Group Therapy Note  02/03/2018 1:15pm  Type of Therapy/Topic:  Group Therapy:  Feelings about Diagnosis  Participation Level:  Did Not Attend-pt invited. Chose to remain in bed.    Description of Group:   This group will allow patients to explore their thoughts and feelings about diagnoses they have received. Patients will be guided to explore their level of understanding and acceptance of these diagnoses. Facilitator will encourage patients to process their thoughts and feelings about the reactions of others to their diagnosis and will guide patients in identifying ways to discuss their diagnosis with significant others in their lives. This group will be process-oriented, with patients participating in exploration of their own experiences, giving and receiving support, and processing challenge from other group members.   Therapeutic Goals: 1. Patient will demonstrate understanding of diagnosis as evidenced by identifying two or more symptoms of the disorder 2. Patient will be able to express two feelings regarding the diagnosis 3. Patient will demonstrate their ability to communicate their needs through discussion and/or role play  Summary of Patient Progress:   x    Therapeutic Modalities:   Cognitive Behavioral Therapy Brief Therapy Feelings Identification    Ledell PeoplesHeather N Smart, LCSW 02/03/2018 11:21 AM

## 2018-02-03 NOTE — Progress Notes (Signed)
D: Pt seemed a little more sad than yesterday and a little anxious but reported feeling fine.  Pt was compliant and pleasant with med administration but was brief with answering questions.  Pt seen in group and standing out in the common area observing other peoples activity.  Pt denies SI/HI and A/V hallucinations.  Pt reports feeling safe on the unit.  A: Pt given medications per MD orders.  Pt encouraged to get a good nights sleep. R: 15 minute safety checks and non-skid socks.

## 2018-02-03 NOTE — Progress Notes (Signed)
[facility] MD Progress Note  02/03/2018 3:52 PM Jeffrey [name]  MRN:  [id number]  Subjective: Jeffrey [name] reports, "I feel more depressed today. I just don't feel good. I went over to the shade where all those people were sitting to join & talk to them, got there, did not feel like talking to anyone. My nerves are messing with me. I feel my chest tightening up on me. This is how I was at my home. My kids did not know what to do with me any more. I'm shake all the time. My depression is pretty bad at #8 & anxiety #9. The problem is, I'm like this all the time".  Jeffrey [name] is a 57 y/o M with history of bipolar disorder, cannabis use, and alcohol use disorder who was admitted from [facility] ED where he had presented after attempting suicide via overdose of temazepam and alcohol. Pt reported that he had consumed an entire bottle of 30 tablets of temazepam *30mg  and drank about 1 pint of hard liquor in an attempt to end his life. He was brought to ED by family, and he was sedated to degree that majority of history was provided by family.  02-03-18, Jeffrey [name] is seen, chart reviewed. The chart findings discussed with the treatment team. He is alert, oriented & aware of situation. He is visible on the unit & participating in the group sessions/activities. He presents with a depressed & flat affect today. He is making eye contact & verbally responsive. He reports feeling more depressed today. He is also complaining of his nerve problems & rates his anxiety at #9, depression #8, just like he did yesterday. Jeffrey [name] reports improved sleep. We have decided to increase his Prozac to 40 mg daily. Although he is complaining of worsening depression, Jeffrey [name] will smile & wave at the staff while standing on the hall way. He currently denies any SIHI, AVH, delusional thoughts or paranoia. Although, presents with a depressed affect, Jeffrey [name] does not appear to be responding to any internal stimuli. He has agreed to continue current  plan of care with just the Prozac increased to 40 mg daily.   Principal Problem: Bipolar disorder with severe depression (HCC)  Diagnosis:   Patient Active Problem List   Diagnosis Date Noted  . Bipolar disorder with severe depression (HCC) [F31.4] 02/01/2018    Priority: High  . Alcohol use disorder, severe, dependence (HCC) [F10.20] 02/01/2018    Priority: Medium  . Cannabis use disorder, severe, dependence (HCC) [F12.20] 02/01/2018    Priority: Medium  . MDD (major depressive disorder), recurrent episode, severe (HCC) [F33.2] 01/31/2018    Priority: Low  . Internal and external bleeding hemorrhoids [K64.4, K64.8]   . Rectal bleeding [K62.5]   . Iron deficiency anemia due to chronic blood loss [D50.0] 03/09/2017  . Anemia [D64.9]   . GIB (gastrointestinal bleeding) [K92.2] 02/07/2017  . Acute blood loss anemia [D62] 02/07/2017  . Weakness generalized [R53.1] 02/07/2017  . Dizziness [R42] 02/07/2017  . Peptic ulcer disease [K27.9]   . Bipolar disorder (HCC) [F31.9]   . 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 Dyspnea [R06.00] 08/26/2016  . H/O: depression [Z86.59] 08/26/2016  . Dyslipidemia [E78.5] 08/26/2016  . Tobacco use disorder [F17.200] 08/26/2016  . [name] (peripheral vascular disease) (HCC) [I73.9] 06/18/2015  . Chronic diarrhea [K52.9] 05/02/2013  . Hematochezia [K92.1] 05/02/2013  . Abnormal weight loss [R63.4] 05/02/2013  . Abdominal pain, periumbilical [R10.33] 05/02/2013   Total Time spent with patient: 15 minutes  Past  Psychiatric History: See H&P  Past Medical History:  Past Medical History:  Diagnosis Date  . Acute blood loss anemia 02/07/2017  . Anxiety   . Arthritis    deg disease, bulging disk,  shoulder level  . Bipolar disorder (HCC)   . Bipolar disorder (HCC)   . 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 Depression    anxiety  . Hyperlipidemia   . Hypertension   . Peptic ulcer disease    Review  . Pneumonia   . [name] (peripheral vascular disease) (HCC)  06/18/2015  . Shortness of breath     Past Surgical History:  Procedure Laterality Date  . BIOPSY  02/09/2017   Procedure: BIOPSY;  Surgeon: [name], MD;  Location: [facility];  Service: Endoscopy;;  duodenal gastric  . COLONOSCOPY  03/14/2007   [id number] colonoscopy and terminal ileoscopy except external hemorrhoids  . COLONOSCOPY N/A 05/05/2013   [name]: external/internal anal canal hemorrhoids, unable to intubate TI, segemental biopsies unremarkable   . COLONOSCOPY N/A 04/11/2017   Procedure: COLONOSCOPY;  Surgeon: [name], MD;  Location: [facility];  Service: Endoscopy;  Laterality: N/A;  . COLONOSCOPY WITH PROPOFOL N/A 03/31/2017   Procedure: COLONOSCOPY WITH PROPOFOL;  Surgeon: [name], MD;  Location: [facility];  Service: Endoscopy;  Laterality: N/A;  1:45pm  . ESOPHAGOGASTRODUODENOSCOPY  03/14/2007   [id number] antral gastritis with bulbar duodenitis/paucity to postbulbar duodenal folds and biopsy were benign with no evidence of villous atrophy.  . ESOPHAGOGASTRODUODENOSCOPY (EGD) WITH PROPOFOL N/A 02/09/2017   Procedure: ESOPHAGOGASTRODUODENOSCOPY (EGD) WITH PROPOFOL;  Surgeon: [name], MD;  [facility]: [facility];  Service: Endoscopy;  Laterality: N/A;  . GIVENS CAPSULE STUDY N/A 04/08/2017   Procedure: GIVENS CAPSULE STUDY;  Surgeon: [name], MD;  Location: [facility];  Service: Endoscopy;  Laterality: N/A;  . HAND SURGERY     left, secondary to self-inflicted laceration  . HEMORRHOID SURGERY N/A 04/14/2017   Procedure: HEMORRHOIDECTOMY;  Surgeon: [name], MD;  Location: [facility];  Service: General;  Laterality: N/A;  . SHOULDER SURGERY     right  . TOE SURGERY     left great toe , amputated-lawnmover accident   Family History:  Family History  Problem Relation Age of Onset  . Breast cancer Mother        deceased  . Heart disease Father   . Depression Daughter   . Anxiety disorder Daughter   . Anxiety disorder Son    . Depression Son   . Asthma Brother   . Heart attack Maternal Aunt   . Heart attack Maternal Uncle   . Heart attack Paternal Aunt   . Heart attack Paternal Uncle   . Heart attack Maternal Grandmother   . Heart attack Maternal Grandfather   . Emphysema Maternal Grandfather   . Heart attack Paternal Grandmother   . Heart attack Paternal Grandfather   . Colon cancer Neg Hx   . Liver disease Neg Hx    Family Psychiatric  History: See H&P.  Social History:  Social History   Substance and Sexual Activity  Alcohol Use No  . Alcohol/week: 0.0 oz     Social History   Substance and Sexual Activity  Drug Use Yes  . Types: Marijuana   Comment: most days     Social History   Socioeconomic History  . Marital status: Divorced    Spouse name: None  . Number of children: 2  . Years  of education: None  . Highest education level: None  Social Needs  . Financial resource strain: None  . Food insecurity - worry: None  . Food insecurity - inability: None  . Transportation needs - medical: None  . Transportation needs - non-medical: None  Occupational History  . Occupation: Disabled  Tobacco Use  . Smoking status: Current Every Day Smoker    Packs/day: 1.00    Years: 40.00    Pack years: 40.00    Types: Cigarettes    Start date: 08/04/1971  . Smokeless tobacco: Never Used  . Tobacco comment: peak rate of 2.5ppd, 1/2ppd on 11/27/2016 -- 6 cigarettes / day 01/25/18  Substance and Sexual Activity  . Alcohol use: No    Alcohol/week: 0.0 oz  . Drug use: Yes    Types: Marijuana    Comment: most days   . Sexual activity: No  Other Topics Concern  . None  Social History Narrative   Originally from [place]. Previously has lived in [place] & [place]. 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 Questionable asbestos exposure. Does have significant exposure to fumes. No mold exposure. No bird exposure. No pets currently.    Additional  Social History:   Sleep: Good  Appetite:  Good  Current Medications: Current Facility-Administered Medications  Medication Dose Route Frequency Provider Last Rate Last Dose  . acetaminophen (TYLENOL) tablet 650 mg  650 mg Oral Q6H PRN Kerry Hough, PA-C   650 mg at 02/03/18 0757  . albuterol (PROVENTIL HFA;VENTOLIN HFA) 108 (90 Base) MCG/ACT inhaler 2 puff  2 puff Inhalation Q4H PRN Cobos, Rockey Situ, MD   2 puff at 02/02/18 1002  . albuterol (PROVENTIL) (2.5 MG/3ML) 0.083% nebulizer solution 2.5 mg  2.5 mg Nebulization Q4H PRN Cobos, Rockey Situ, MD      . alum & mag hydroxide-simeth (MAALOX/MYLANTA) 200-200-20 MG/5ML suspension 30 mL  30 mL Oral Q4H PRN Kerry Hough, PA-C      . amLODipine (NORVASC) tablet 2.5 mg  2.5 mg Oral Daily Jabree Pernice I, NP   2.5 mg at 02/03/18 1436  . aspirin EC tablet 81 mg  81 mg Oral QPM Kerry Hough, PA-C   81 mg at 02/02/18 1729  . atorvastatin (LIPITOR) tablet 20 mg  20 mg Oral Daily Donell Sievert E, PA-C   20 mg at 02/03/18 0757  . divalproex (DEPAKOTE ER) 24 hr tablet 1,500 mg  1,500 mg Oral QHS Haygen Zebrowski I, NP   1,500 mg at 02/02/18 2114  . [START ON 02/04/2018] FLUoxetine (PROZAC) capsule 40 mg  40 mg Oral BH-q7a Anam Bobby I, NP      . gabapentin (NEURONTIN) capsule 300 mg  300 mg Oral TID Armandina Stammer I, NP   300 mg at 02/03/18 1259  . hydrOXYzine (ATARAX/VISTARIL) tablet 25 mg  25 mg Oral Q6H PRN Donell Sievert E, PA-C   25 mg at 02/03/18 1401  . ipratropium (ATROVENT) nebulizer solution 0.5 mg  0.5 mg Nebulization TID Donell Sievert E, PA-C   0.5 mg at 02/03/18 1259  . loperamide (IMODIUM) capsule 2-4 mg  2-4 mg Oral PRN Kerry Hough, PA-C      . LORazepam (ATIVAN) tablet 1 mg  1 mg Oral Q6H PRN Kerry Hough, PA-C   1 mg at 02/02/18 2203  . magnesium hydroxide (MILK OF MAGNESIA) suspension 30 mL  30 mL Oral Daily PRN Kerry Hough, PA-C      .  metoprolol tartrate (LOPRESSOR) tablet 50 mg  50 mg Oral BID Kerry Hough,  PA-C   50 mg at 02/03/18 0755  . mometasone-formoterol (DULERA) 200-5 MCG/ACT inhaler 2 puff  2 puff Inhalation BID Kerry Hough, PA-C   2 puff at 02/03/18 0755  . multivitamin with minerals tablet 1 tablet  1 tablet Oral Daily Kerry Hough, PA-C   1 tablet at 02/03/18 0755  . nicotine (NICODERM CQ - dosed in mg/24 hours) patch 21 mg  21 mg Transdermal Daily Cobos, Fernando A, MD      . OLANZapine (ZYPREXA) tablet 10 mg  10 mg Oral QHS Armandina Stammer I, NP   10 mg at 02/02/18 2115  . ondansetron (ZOFRAN-ODT) disintegrating tablet 4 mg  4 mg Oral Q6H PRN Donell Sievert E, PA-C      . thiamine (B-1) injection 100 mg  100 mg Intramuscular Once Donell Sievert E, PA-C      . thiamine (VITAMIN B-1) tablet 100 mg  100 mg Oral Daily Donell Sievert E, PA-C   100 mg at 02/03/18 7425   Lab Results: No results found for this or any previous visit (from the past 48 hour(s)).  Blood Alcohol level:  Lab Results  Component Value Date   ETH 181 (H) 01/30/2018   ETH  07/20/2007    <5        LOWEST DETECTABLE LIMIT FOR SERUM ALCOHOL IS 11 mg/dL FOR MEDICAL PURPOSES ONLY    Metabolic Disorder Labs: No results found for: HGBA1C, MPG No results found for: PROLACTIN No results found for: CHOL, TRIG, HDL, CHOLHDL, VLDL, LDLCALC  Physical Findings: AIMS: Facial and Oral Movements Muscles of Facial Expression: None, normal Lips and Perioral Area: None, normal Jaw: None, normal Tongue: None, normal,Extremity Movements Upper (arms, wrists, hands, fingers): None, normal Lower (legs, knees, ankles, toes): None, normal, Trunk Movements Neck, shoulders, hips: None, normal, Overall Severity Severity of abnormal movements (highest score from questions above): None, normal Incapacitation due to abnormal movements: None, normal Patient's awareness of abnormal movements (rate only patient's report): No Awareness, Dental Status Current problems with teeth and/or dentures?: No Does patient usually wear  dentures?: No  CIWA:  CIWA-Ar Total: 0 COWS:  COWS Total Score: 1  Musculoskeletal: Strength & Muscle Tone: within normal limits Gait & Station: normal Patient leans: N/A  Psychiatric Specialty Exam: Physical Exam  Nursing note and vitals reviewed.   Review of Systems  Psychiatric/Behavioral: Positive for depression and substance abuse. Negative for hallucinations, memory loss and suicidal ideas. The patient is nervous/anxious. The patient does not have insomnia.     Blood pressure (!) 114/97, pulse 69, temperature 97.7 F (36.5 C), temperature source Oral, resp. rate 17, height 5\' 7"  (1.702 m), weight 60.3 kg (133 lb), SpO2 98 %.Body mass index is 20.83 kg/m.  General Appearance: Casual and Fairly Groomed  Eye Contact:  Good  Speech:  Clear and Coherent and Normal Rate  Volume:  Normal  Mood:  Anxious and Depressed  Affect:  Appropriate, Congruent and Constricted  Thought Process:  Coherent and Goal Directed  Orientation:  Full (Time, Place, and Person)  Thought Content:  Logical  Suicidal Thoughts:  Yes.  without intent/plan  Homicidal Thoughts:  No  Memory:  Immediate;   Fair Recent;   Fair Remote;   Fair  Judgement:  Poor  Insight:  Fair  Psychomotor Activity:  Normal  Concentration:  Concentration: Fair  Recall:  [employer] of Knowledge:  Fair  Language:  Fair  Akathisia:  No  Handed:    AIMS (if indicated):     Assets:  Desire for Improvement Resilience  ADL's:  Intact  Cognition:  WNL  Sleep: 6.75      Treatment Plan Summary: Daily contact with patient to assess and evaluate symptoms and progress in treatment: "Jeffrey [name]" reports worsening symptoms today, again. He says he is feeling more depressed. We will increase his Prozac to 40 mg daily. Will continue inpatient hospitalization.     - Will continue today 02/03/2018 plan as below except where it is noted.  Mood stabilization.    - Continue Depakote ER 1500 mg po Q hs.  Depression.    - Increased Prozac  from 20 mg to Prozac 40 mg po po daily.  Agitation.    - Continue Gabapentin 300 mg po tid.  Mood control.     - Olanzapine 10 mg po Q hs.  Smoking cessation.     - Continue Nicotine patch 21 mg topically Q 24 hours.  Other medical issues.     - Continue Albuterol inhaler 108 (90 base) Mcg/Act 2 puffs Q 4 hours prn for SOB.     - Continue ipratropium  Soln 0.5 mg tid for SOB.     - Continue ASA 81 mg po daily for heart health.     - Continue Lipitor 20 mg po Q evenings for high cholesterol.     - Continue Metoprolol tartrate 50 mg po bid for HTN.     - Initiated Lisinopril 5 mg po daily x 3 days for elevated blood pressure.     - Continue Dulera 200-5 MCG/Act 2 puffs bid for SOB.  Patient to participate in the group counseling sessions. Discharged disposition ongoing.  Armandina Stammer, NP, pmhnp, fnp-Bc 02/03/2018, 3:52 PMPatient ID: [name], male   DOB: 1961-01-14, 57 y.o.   MRN: [id number]

## 2018-02-03 NOTE — Progress Notes (Signed)
Adult Psychoeducational Group Note  Date:  02/03/2018 Time:  10:02 AM  Group Topic/Focus:  Goals Group:   The focus of this group is to help patients establish daily goals to achieve during treatment and discuss how the patient can incorporate goal setting into their daily lives to aide in recovery.  Participation Level:  Minimal  Participation Quality:  Appropriate  Affect:  Appropriate  Cognitive:  Appropriate  Insight: Limited  Engagement in Group:  Limited  Modes of Intervention:  Discussion and Education  Additional Comments:  Pt attend the morning group but did not participate much.   Rubyann Lingle E 02/03/2018, 10:02 AM

## 2018-02-04 ENCOUNTER — Telehealth (HOSPITAL_COMMUNITY): Payer: Self-pay

## 2018-02-04 MED ORDER — TRAZODONE HCL 50 MG PO TABS: 50.0000 mg | ORAL_TABLET | Freq: Every evening | ORAL | Status: DC | PRN

## 2018-02-04 MED ORDER — TRAZODONE HCL 50 MG PO TABS
50.0000 mg | ORAL_TABLET | Freq: Every evening | ORAL | Status: DC | PRN
  Administered 2018-02-04 – 2018-02-07 (×6): 50 mg via ORAL
  Filled 2018-02-04 (×14): qty 1

## 2018-02-04 MED ORDER — HYDROXYZINE HCL 50 MG PO TABS
50.0000 mg | ORAL_TABLET | Freq: Four times a day (QID) | ORAL | Status: DC | PRN
  Administered 2018-02-04 – 2018-02-08 (×12): 50 mg via ORAL
  Filled 2018-02-04 (×12): qty 1
  Filled 2018-02-04: qty 10
  Filled 2018-02-04: qty 1

## 2018-02-04 NOTE — Progress Notes (Signed)
D Patient is observed standing at the medication window this morning. HE is anxious. HE smiles hesitantly. HE says " I slept better last night but still not good". A He completed his daily assessment and on this he wrote he deneid SI today and he rated his depression, hopelessness and anxeity " 8/5/8", respectively. He is polite, cooperative,and corgial, making dirrect eye contact, asking appropriate questions and stating " I wanna do everything I can to make this workk.....Charlene Brookecuz I'm not taking another drop!!!". R Safety in place

## 2018-02-04 NOTE — [facility] Group Notes (Signed)
LCSW Group Therapy Note 02/04/2018 12:27 PM  Type of Therapy and Topic: Group Therapy: Feelings around Relapse and Recovery  Participation Level: Active   Description of Group:  Patients in this group will discuss emotions they experience before and after a relapse. They will process how experiencing these feelings, or avoidance of experiencing them, relates to having a relapse. Facilitator will guide patients to explore emotions they have related to recovery. Patients will be encouraged to process which emotions are more powerful. They will be guided to discuss the emotional reaction significant others in their lives may have to their relapse or recovery. Patients will be assisted in exploring ways to respond to the emotions of others without this contributing to a relapse.  Therapeutic Goals: 1. Patient will identify two or more emotions that lead to a relapse for them 2. Patient will identify two emotions that result when they relapse 3. Patient will identify two emotions related to recovery 4. Patient will demonstrate ability to communicate their needs through discussion and/or role plays  Summary of Patient Progress:   Jeffrey [name] was engaged and participated throughout the group session. Jeffrey [name] stated that relapse to him was an"emotion". He explained that relapse is mostly triggered by your emotions. Jeffrey [name] states that he lacks the motivation to change because he has used drugs since the age of 57. Jeffrey [name] states the only factor that could possibly help him become sober is his children. Jeffrey [name] states that while in the hospital, he has been able to identify his triggers and plans to speak with the doctor to prescribe him the correct medications. Jeffrey [name] believes this would help him live a sober life outside of the hospital.    Therapeutic Modalities:  Cognitive Behavioral Therapy Solution-Focused Therapy Assertiveness Training Relapse Prevention Therapy   Jeffrey DroughtJolan Twyla [name] LCSWA Clinical Social Worker

## 2018-02-04 NOTE — Progress Notes (Signed)
Nursing Progress Note [id number]  D) Patient verbally agrees to work with Theatre stage managernursing student. Patient agrees to make needs known with staff.  A) Patient educated about and provided medication as scheduled or requested per provider's orders. Patient safety maintained with q15 min safety checks. High fall risk precautions in place. Emotional support given. 1:1 interaction and active listening provided. Snacks and fluids provided. Labs, vital signs and patient behavior monitored throughout shift. Patient encouraged to work on treatment plan.  R) Patient remains safe on the unit at this time. Patient agrees to make needs known to staff. Will continue to monitor and assess for changes.

## 2018-02-04 NOTE — Progress Notes (Signed)
Pt attend wrap up group his day was a 2. His goal was to stop drinking and to stay out of trouble.

## 2018-02-04 NOTE — Progress Notes (Signed)
[facility] MD Progress Note  02/04/2018 3:42 PM AIJALON KIRTZ  MRN:  [id number] Subjective:    Jeffrey [name]" [name] is a 57 y/o M with history of bipolar disorder, cannabis use, and alcohol use disorder who was admitted from [facility] ED where he had presented after attempting suicide via overdose of temazepam and alcohol. Pt reported that he had consumed an entire bottle of 30 tablets of temazepam *30mg  and drank about 1 pint of hard liquor in an attempt to end his life. He was brought to ED by family, and he was sedated to degree that majority of history was provided by family. Pt was restarted on depakote and prozac, and he was also titrated up on dose of olanzapine. He reported gradual improvement of presenting symptoms.  Today upon evaluation, pt shares, "They discontinued one of my meds and I need something for my nerves." Pt's order for vistaril had expired as it was associated with CIWA order set, and order for vistaril was restarted as per pt's request. He notes that his mood is improving incrementally each day. He denies SI/HI/AH/VH. He is sleeping well. He has no physical complaints or side effects. He agrees to remain on his current regimen without changes.  Principal Problem: Bipolar disorder with severe depression (HCC) Diagnosis:   Patient Active Problem List   Diagnosis Date Noted  . Bipolar disorder with severe depression (HCC) [F31.4] 02/01/2018  . Alcohol use disorder, severe, dependence (HCC) [F10.20] 02/01/2018  . Cannabis use disorder, severe, dependence (HCC) [F12.20] 02/01/2018  . MDD (major depressive disorder), recurrent episode, severe (HCC) [F33.2] 01/31/2018  . Internal and external bleeding hemorrhoids [K64.4, K64.8]   . Rectal bleeding [K62.5]   . Iron deficiency anemia due to chronic blood loss [D50.0] 03/09/2017  . Anemia [D64.9]   . GIB (gastrointestinal bleeding) [K92.2] 02/07/2017  . Acute blood loss anemia [D62] 02/07/2017  . Weakness generalized  [R53.1] 02/07/2017  . Dizziness [R42] 02/07/2017  . Peptic ulcer disease [K27.9]   . Bipolar disorder (HCC) [F31.9]   . 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 Dyspnea [R06.00] 08/26/2016  . H/O: depression [Z86.59] 08/26/2016  . Dyslipidemia [E78.5] 08/26/2016  . Tobacco use disorder [F17.200] 08/26/2016  . [name] (peripheral vascular disease) (HCC) [I73.9] 06/18/2015  . Chronic diarrhea [K52.9] 05/02/2013  . Hematochezia [K92.1] 05/02/2013  . Abnormal weight loss [R63.4] 05/02/2013  . Abdominal pain, periumbilical [R10.33] 05/02/2013   Total Time spent with patient: 30 minutes  Past Psychiatric History: see H&P  Past Medical History:  Past Medical History:  Diagnosis Date  . Acute blood loss anemia 02/07/2017  . Anxiety   . Arthritis    deg disease, bulging disk,  shoulder level  . Bipolar disorder (HCC)   . Bipolar disorder (HCC)   . 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 Depression    anxiety  . Hyperlipidemia   . Hypertension   . Peptic ulcer disease    Review  . Pneumonia   . [name] (peripheral vascular disease) (HCC) 06/18/2015  . Shortness of breath     Past Surgical History:  Procedure Laterality Date  . BIOPSY  02/09/2017   Procedure: BIOPSY;  Surgeon: [name], MD;  Location: [facility];  Service: Endoscopy;;  duodenal gastric  . COLONOSCOPY  03/14/2007   WUJ:[id number] colonoscopy and terminal ileoscopy except external hemorrhoids  . COLONOSCOPY N/A 05/05/2013   [name]: external/internal anal canal hemorrhoids, unable to intubate TI, segemental biopsies unremarkable   . COLONOSCOPY N/A 04/11/2017  Procedure: COLONOSCOPY;  Surgeon: West BaliFields, Sandi L, MD;  Location: [facility];  Service: Endoscopy;  Laterality: N/A;  . COLONOSCOPY WITH PROPOFOL N/A 03/31/2017   Procedure: COLONOSCOPY WITH PROPOFOL;  Surgeon: Corbin Adeourk, Robert M, MD;  Location: [facility];  Service: Endoscopy;  Laterality: N/A;  1:45pm  . ESOPHAGOGASTRODUODENOSCOPY  03/14/2007   ZOX:[id number]:Nonerosive  antral gastritis with bulbar duodenitis/paucity to postbulbar duodenal folds and biopsy were benign with no evidence of villous atrophy.  . ESOPHAGOGASTRODUODENOSCOPY (EGD) WITH PROPOFOL N/A 02/09/2017   Procedure: ESOPHAGOGASTRODUODENOSCOPY (EGD) WITH PROPOFOL;  Surgeon: West BaliSandi L Fields, MD;  Location: [facility];  Service: Endoscopy;  Laterality: N/A;  . GIVENS CAPSULE STUDY N/A 04/08/2017   Procedure: GIVENS CAPSULE STUDY;  Surgeon: Corbin Adeourk, Robert M, MD;  Location: [facility];  Service: Endoscopy;  Laterality: N/A;  . HAND SURGERY     left, secondary to self-inflicted laceration  . HEMORRHOID SURGERY N/A 04/14/2017   Procedure: HEMORRHOIDECTOMY;  Surgeon: Franky MachoJenkins, Mark, MD;  Location: [facility];  Service: General;  Laterality: N/A;  . SHOULDER SURGERY     right  . TOE SURGERY     left great toe , amputated-lawnmover accident   Family History:  Family History  Problem Relation Age of Onset  . Breast cancer Mother        deceased  . Heart disease Father   . Depression Daughter   . Anxiety disorder Daughter   . Anxiety disorder Son   . Depression Son   . Asthma Brother   . Heart attack Maternal Aunt   . Heart attack Maternal Uncle   . Heart attack Paternal Aunt   . Heart attack Paternal Uncle   . Heart attack Maternal Grandmother   . Heart attack Maternal Grandfather   . Emphysema Maternal Grandfather   . Heart attack Paternal Grandmother   . Heart attack Paternal Grandfather   . Colon cancer Neg Hx   . Liver disease Neg Hx    Family Psychiatric  History: see H&P Social History:  Social History   Substance and Sexual Activity  Alcohol Use No  . Alcohol/week: 0.0 oz     Social History   Substance and Sexual Activity  Drug Use Yes  . Types: Marijuana   Comment: most days     Social History   Socioeconomic History  . Marital status: Divorced    Spouse name: None  . Number of children: 2  . Years of education: None  . Highest education level: None  Social Needs   . Financial resource strain: None  . Food insecurity - worry: None  . Food insecurity - inability: None  . Transportation needs - medical: None  . Transportation needs - non-medical: None  Occupational History  . Occupation: Disabled  Tobacco Use  . Smoking status: Current Every Day Smoker    Packs/day: 1.00    Years: 40.00    Pack years: 40.00    Types: Cigarettes    Start date: 08/04/1971  . Smokeless tobacco: Never Used  . Tobacco comment: peak rate of 2.5ppd, 1/2ppd on 11/27/2016 -- 6 cigarettes / day 01/25/18  Substance and Sexual Activity  . Alcohol use: No    Alcohol/week: 0.0 oz  . Drug use: Yes    Types: Marijuana    Comment: most days   . Sexual activity: No  Other Topics Concern  . None  Social History Narrative   [place] 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 Questionable asbestos exposure. Does have significant exposure to fumes. No mold exposure. No bird exposure. No pets currently.    Additional Social History:                         Sleep: Good  Appetite:  Good  Current Medications: Current Facility-Administered Medications  Medication Dose Route Frequency Provider Last Rate Last Dose  . acetaminophen (TYLENOL) tablet 650 mg  650 mg Oral Q6H PRN Kerry Hough, PA-C   650 mg at 02/03/18 0757  . albuterol (PROVENTIL HFA;VENTOLIN HFA) 108 (90 Base) MCG/ACT inhaler 2 puff  2 puff Inhalation Q4H PRN Cobos, Rockey Situ, MD   2 puff at 02/04/18 0756  . albuterol (PROVENTIL) (2.5 MG/3ML) 0.083% nebulizer solution 2.5 mg  2.5 mg Nebulization Q4H PRN Cobos, Rockey Situ, MD      . alum & mag hydroxide-simeth (MAALOX/MYLANTA) 200-200-20 MG/5ML suspension 30 mL  30 mL Oral Q4H PRN Kerry Hough, PA-C      . amLODipine (NORVASC) tablet 2.5 mg  2.5 mg Oral Daily Armandina Stammer I, NP   2.5 mg at 02/04/18 0758  . aspirin EC tablet 81 mg  81 mg Oral QPM  Kerry Hough, PA-C   81 mg at 02/03/18 1718  . atorvastatin (LIPITOR) tablet 20 mg  20 mg Oral Daily Donell Sievert E, PA-C   20 mg at 02/04/18 0757  . divalproex (DEPAKOTE ER) 24 hr tablet 1,500 mg  1,500 mg Oral QHS Nwoko, Agnes I, NP   1,500 mg at 02/03/18 2138  . FLUoxetine (PROZAC) capsule 40 mg  40 mg Oral Jobe Gibbon I, NP   40 mg at 02/04/18 0758  . gabapentin (NEURONTIN) capsule 300 mg  300 mg Oral TID Armandina Stammer I, NP   300 mg at 02/04/18 1214  . hydrOXYzine (ATARAX/VISTARIL) tablet 50 mg  50 mg Oral Q6H PRN Jolyne Loa T, MD      . ipratropium (ATROVENT) nebulizer solution 0.5 mg  0.5 mg Nebulization TID Donell Sievert E, PA-C   0.5 mg at 02/04/18 1214  . magnesium hydroxide (MILK OF MAGNESIA) suspension 30 mL  30 mL Oral Daily PRN Donell Sievert E, PA-C      . metoprolol tartrate (LOPRESSOR) tablet 50 mg  50 mg Oral BID Donell Sievert E, PA-C   50 mg at 02/04/18 0757  . mometasone-formoterol (DULERA) 200-5 MCG/ACT inhaler 2 puff  2 puff Inhalation BID Kerry Hough, PA-C   2 puff at 02/04/18 0757  . multivitamin with minerals tablet 1 tablet  1 tablet Oral Daily Kerry Hough, PA-C   1 tablet at 02/04/18 0757  . nicotine (NICODERM CQ - dosed in mg/24 hours) patch 21 mg  21 mg Transdermal Daily Cobos, Fernando A, MD      . OLANZapine (ZYPREXA) tablet 10 mg  10 mg Oral QHS Armandina Stammer I, NP   10 mg at 02/03/18 2138  . thiamine (B-1) injection 100 mg  100 mg Intramuscular Once Donell Sievert E, PA-C      . thiamine (VITAMIN B-1) tablet 100 mg  100 mg Oral Daily Donell Sievert E, PA-C   100 mg at 02/04/18 9604    Lab Results: No results found for this or any previous visit (from the past 48 hour(s)).  Blood Alcohol level:  Lab Results  Component Value Date   ETH 181 (  H) 01/30/2018   ETH  07/20/2007    <5        LOWEST DETECTABLE LIMIT FOR SERUM ALCOHOL IS 11 mg/dL FOR MEDICAL PURPOSES ONLY    Metabolic Disorder Labs: No results found for: HGBA1C,  MPG No results found for: PROLACTIN No results found for: CHOL, TRIG, HDL, CHOLHDL, VLDL, LDLCALC  Physical Findings: AIMS: Facial and Oral Movements Muscles of Facial Expression: None, normal Lips and Perioral Area: None, normal Jaw: None, normal Tongue: None, normal,Extremity Movements Upper (arms, wrists, hands, fingers): None, normal Lower (legs, knees, ankles, toes): None, normal, Trunk Movements Neck, shoulders, hips: None, normal, Overall Severity Severity of abnormal movements (highest score from questions above): None, normal Incapacitation due to abnormal movements: None, normal Patient's awareness of abnormal movements (rate only patient's report): No Awareness, Dental Status Current problems with teeth and/or dentures?: No Does patient usually wear dentures?: No  CIWA:  CIWA-Ar Total: 1 COWS:  COWS Total Score: 1  Musculoskeletal: Strength & Muscle Tone: within normal limits Gait & Station: normal Patient leans: N/A  Psychiatric Specialty Exam: Physical Exam  Nursing note and vitals reviewed.   Review of Systems  Constitutional: Negative for fever.  Respiratory: Negative for cough and shortness of breath.   Cardiovascular: Negative for chest pain.  Gastrointestinal: Negative for abdominal pain, heartburn, nausea and vomiting.  Psychiatric/Behavioral: Positive for depression. Negative for hallucinations and suicidal ideas. The patient is nervous/anxious. The patient does not have insomnia.     Blood pressure 120/73, pulse 76, temperature 97.7 F (36.5 C), temperature source Oral, resp. rate 18, height 5\' 7"  (1.702 m), weight 60.3 kg (133 lb), SpO2 98 %.Body mass index is 20.83 kg/m.  General Appearance: Casual and Fairly Groomed  Eye Contact:  Good  Speech:  Clear and Coherent and Normal Rate  Volume:  Normal  Mood:  Anxious and Depressed  Affect:  Appropriate, Congruent and Constricted  Thought Process:  Coherent and Goal Directed  Orientation:  Full (Time,  Place, and Person)  Thought Content:  Logical  Suicidal Thoughts:  No  Homicidal Thoughts:  No  Memory:  Immediate;   Fair Recent;   Fair Remote;   Fair  Judgement:  Fair  Insight:  Fair  Psychomotor Activity:  Normal  Concentration:  Concentration: Fair  Recall:  [employer] of Knowledge:  Fair  Language:  Fair  Akathisia:  No  Handed:    AIMS (if indicated):     Assets:  Communication Skills Resilience  ADL's:  Intact  Cognition:  WNL  Sleep:  Number of Hours: 6.75     Treatment Plan Summary: Daily contact with patient to assess and evaluate symptoms and progress in treatment and Medication management   -Continue inpatient hospitalization  -Bipolar I current episode depressed    - Continue Depakote ER 1500 mg po Q hs.    -Continue Prozac 40 mg po po daily.     - Olanzapine 10 mg po Q hs.  -Anxiety    - Continue Gabapentin 300 mg po tid.    - Continue atarax 50mg  po q6h prn anxiety  -Smoking cessation.     - Continue Nicotine patch 21 mg topically Q 24 hours.  -Other medical issues.     - Continue Albuterol inhaler 108 (90 base) Mcg/Act 2 puffs Q 4 hours prn for SOB.     - Continue ipratropium  Soln 0.5 mg tid for SOB.     - Continue ASA 81 mg po daily for heart health.     -  Continue Lipitor 20 mg po Q evenings for high cholesterol.     - Continue Metoprolol tartrate 50 mg po bid for HTN.     - Continue Lisinopril 5 mg po daily x 3 days for elevated blood pressure.     - Continue Dulera 200-5 MCG/Act 2 puffs bid for SOB.  -Patient to participate in the group counseling sessions.  -Discharge disposition planning will be ongoing.  Micheal Likens, MD 02/04/2018, 3:42 PM

## 2018-02-04 NOTE — [facility] Group Notes (Signed)
Adult Psychoeducational Group Note  Date:  02/04/2018 Time:  11:06 AM  Group Topic/Focus:  Relapse Prevention Planning:   The focus of this group is to define relapse and discuss the need for planning to combat relapse.  Participation Level:  Active  Participation Quality:  Attentive  Affect:  Appropriate  Cognitive:  Appropriate  Insight: Appropriate  Engagement in Group:  Improving  Modes of Intervention:  Limit-setting  Additional Comments:    Jeffrey BeersRodney S Dora [name] 02/04/2018, 11:06 AM

## 2018-02-04 NOTE — Progress Notes (Signed)
Recreation Therapy Notes  Date: 3.15.19 Time: 9:30 a.m. Location: [address] Dayroom  Group Topic: Stress Management  Goal Area(s) Addresses:  Goal 1.1: To reduce stress  -Patient will report feeling a reduction in stress level  -Patient will identify the importance of stress management  -Patient will participate during stress management group treatment    Behavioral Response: Engaged  Intervention: Stress Management  Activity: Meditattion- Recreation Therapy Instructed a yoga group. Patients were in a peaceful environment with soft lighting enhancing patients mood.   Education: Stress Management, Discharge Planning.   Education Outcome: Acknowledges edcuation/In group clarification offered/Needs additional education  Clinical Observations/Feedback:: Patient attended and participated appropriately during stress management group treatment. Patient reported feeling a reduction in stress level.   Zacchaeus Halm, Recreation Therapy Intern   Jessina Marse 02/04/2018 8:14 AM 

## 2018-02-04 NOTE — Telephone Encounter (Signed)
Jeffrey Brokeross, Deborah R, MD  [name], Shekinah L, CMA        Yes, you can notify the pharmacy to discontinue them   Previous Messages    ----- Message -----  From: [name], Shekinah L, CMA  Sent: 02/01/2018  4:48 PM  To: Jeffrey Brokereborah R Ross, MD   Received a call from Dr. John GiovanniStephen [name] office wanting to notify the office about Jeffrey [name]. He was recently in the ED for suicidal ideations. Dr. Michelle NasutiKnowlton's office said that patient is on Temazepam, and Klonopin and the office wanted to know did you want those meds to be stopped so he can't pick them up at the pharmacy?        Late entry.   Called pharmacy on 02-02-18 and informed them of Dr. Tenny Crawoss message to discontinue Klonopin and Temazepam.

## 2018-02-04 NOTE — [facility] Group Notes (Signed)
[facility] Group Notes:  (Nursing/MHT/Case Management/Adjunct)  Date:  02/04/2018  Time:  4:00 p.m.  Type of Therapy:  Psychoeducational Skills  Participation Level:  Active  Participation Quality:  Appropriate  Affect:  Appropriate  Cognitive:  Appropriate  Insight:  Appropriate  Engagement in Group:  Engaged  Modes of Intervention:  Education  Summary of Progress/Problems:  Patient participated appropriately in group.   Earline MayotteKnight, Daci Stubbe Shephard 02/04/2018, 6:51 PM

## 2018-02-05 NOTE — Progress Notes (Signed)
[facility]  02/05/2018 11:47 AM Marni Griffondward T Kegley  MRN:  [id number]   Subjective:  Patient reports that he feels he is doing much better. He reports continued gradual improvement. He rates his depression at 5/10 and his anxiety at 5/10. He denies any SI/HI/AVH and contracts for safety. He states that he feels anxious today and that is normal and he just took a Vistaril and it really helped him yesterday. He denies any medication side effects and he is hoping to leave in the next couple of days. He reports that his biggest problem is discussing all the things that have happened to him. He states that when he talks about it the depression can become overwhelming and he plans to find a good therapist after discharge to be able to discuss these things.     Objective: Patient's chart and findings reviewed and discussed with treatment team. Patient is pleasant and cooperative. He has been seen in the day room interacting with peers and staff appropriately. He has been complaint with treatment. He is future oriented and planning on therapy appointments to assist with his past. I will request peer support to assist with 1:1 treatments.   Principal Problem: Bipolar disorder with severe depression (HCC) Diagnosis:   Patient Active Problem List   Diagnosis Date Noted  . Bipolar disorder with severe depression (HCC) [F31.4] 02/01/2018  . Alcohol use disorder, severe, dependence (HCC) [F10.20] 02/01/2018  . Cannabis use disorder, severe, dependence (HCC) [F12.20] 02/01/2018  . MDD (major depressive disorder), recurrent episode, severe (HCC) [F33.2] 01/31/2018  . Internal and external bleeding hemorrhoids [K64.4, K64.8]   . Rectal bleeding [K62.5]   . Iron deficiency anemia due to chronic blood loss [D50.0] 03/09/2017  . Anemia [D64.9]   . GIB (gastrointestinal bleeding) [K92.2] 02/07/2017  . Acute blood loss anemia [D62] 02/07/2017  . Weakness generalized [R53.1] 02/07/2017  . Dizziness [R42]  02/07/2017  . Peptic ulcer disease [K27.9]   . Bipolar disorder (HCC) [F31.9]   . 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 Dyspnea [R06.00] 08/26/2016  . H/O: depression [Z86.59] 08/26/2016  . Dyslipidemia [E78.5] 08/26/2016  . Tobacco use disorder [F17.200] 08/26/2016  . [name] (peripheral vascular disease) (HCC) [I73.9] 06/18/2015  . Chronic diarrhea [K52.9] 05/02/2013  . Hematochezia [K92.1] 05/02/2013  . Abnormal weight loss [R63.4] 05/02/2013  . Abdominal pain, periumbilical [R10.33] 05/02/2013   Total Time spent with patient: 15 minutes  Past Psychiatric History: See H&P  Past Medical History:  Past Medical History:  Diagnosis Date  . Acute blood loss anemia 02/07/2017  . Anxiety   . Arthritis    deg disease, bulging disk,  shoulder level  . Bipolar disorder (HCC)   . Bipolar disorder (HCC)   . 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 Depression    anxiety  . Hyperlipidemia   . Hypertension   . Peptic ulcer disease    Review  . Pneumonia   . [name] (peripheral vascular disease) (HCC) 06/18/2015  . Shortness of breath     Past Surgical History:  Procedure Laterality Date  . BIOPSY  02/09/2017   Procedure: BIOPSY;  Surgeon: West BaliSandi L Fields, MD;  [facility];  Service: Endoscopy;;  duodenal gastric  . COLONOSCOPY  03/14/2007   WUJ:WJXBJYR:Normal colonoscopy and terminal ileoscopy except external hemorrhoids  . COLONOSCOPY N/A 05/05/2013   Dr. Jena Gaussourk: external/internal anal canal hemorrhoids, unable to intubate TI, segemental biopsies unremarkable   . COLONOSCOPY N/A 04/11/2017   Procedure: COLONOSCOPY;  Surgeon: Darrick PennaFields,  Darleene Cleaver, MD;  [facility];  Service: Endoscopy;  Laterality: N/A;  . COLONOSCOPY WITH PROPOFOL N/A 03/31/2017   Procedure: COLONOSCOPY WITH PROPOFOL;  Surgeon: [name], MD;  [facility];  Service: Endoscopy;  Laterality: N/A;  1:45pm  . ESOPHAGOGASTRODUODENOSCOPY  03/14/2007   RUE:[id number] antral gastritis with bulbar  duodenitis/paucity to postbulbar duodenal folds and biopsy were benign with no evidence of villous atrophy.  . ESOPHAGOGASTRODUODENOSCOPY (EGD) WITH PROPOFOL N/A 02/09/2017   Procedure: ESOPHAGOGASTRODUODENOSCOPY (EGD) WITH PROPOFOL;  Surgeon: [name], MD;  [facility];  Service: Endoscopy;  Laterality: N/A;  . GIVENS CAPSULE STUDY N/A 04/08/2017   Procedure: GIVENS CAPSULE STUDY;  Surgeon: [name], MD;  [facility];  Service: Endoscopy;  Laterality: N/A;  . HAND SURGERY     left, secondary to self-inflicted laceration  . HEMORRHOID SURGERY N/A 04/14/2017   Procedure: HEMORRHOIDECTOMY;  Surgeon: [name], MD;  Location: [facility];  Service: General;  Laterality: N/A;  . SHOULDER SURGERY     right  . TOE SURGERY     left great toe , amputated-lawnmover accident   Family History:  Family History  Problem Relation Age of Onset  . Breast cancer Mother        deceased  . Heart disease Father   . Depression Daughter   . Anxiety disorder Daughter   . Anxiety disorder Son   . Depression Son   . Asthma Brother   . Heart attack Maternal Aunt   . Heart attack Maternal Uncle   . Heart attack Paternal Aunt   . Heart attack Paternal Uncle   . Heart attack Maternal Grandmother   . Heart attack Maternal Grandfather   . Emphysema Maternal Grandfather   . Heart attack Paternal Grandmother   . Heart attack Paternal Grandfather   . Colon cancer Neg Hx   . Liver disease Neg Hx    Family Psychiatric  History: See H&P Social History:  Social History   Substance and Sexual Activity  Alcohol Use No  . Alcohol/week: 0.0 oz     Social History   Substance and Sexual Activity  Drug Use Yes  . Types: Marijuana   Comment: most days     Social History   Socioeconomic History  . Marital status: Divorced    Spouse name: None  . Number of children: 2  . Years of education: None  . Highest education level: None  Social Needs  . Financial resource strain:  None  . Food insecurity - worry: None  . Food insecurity - inability: None  . Transportation needs - medical: None  . Transportation needs - non-medical: None  Occupational History  . Occupation: Disabled  Tobacco Use  . Smoking status: Current Every Day Smoker    Packs/day: 1.00    Years: 40.00    Pack years: 40.00    Types: Cigarettes    Start date: 08/04/1971  . Smokeless tobacco: Never Used  . Tobacco comment: peak rate of 2.5ppd, 1/2ppd on 11/27/2016 -- 6 cigarettes / day 01/25/18  Substance and Sexual Activity  . Alcohol use: No    Alcohol/week: 0.0 oz  . Drug use: Yes    Types: Marijuana    Comment: most days   . Sexual activity: No  Other Topics Concern  . None  Social History Narrative   Originally from [place]. Previously has lived in [facility] [place] 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 Questionable asbestos exposure. Does have significant exposure to fumes. No mold exposure. No bird exposure. No pets currently.    Additional Social History:                         Sleep: Good  Appetite:  Good  Current Medications: Current Facility-Administered Medications  Medication Dose Route Frequency Provider Last Rate Last Dose  . acetaminophen (TYLENOL) tablet 650 mg  650 mg Oral Q6H PRN Kerry Hough, PA-C   650 mg at 02/03/18 0757  . albuterol (PROVENTIL HFA;VENTOLIN HFA) 108 (90 Base) MCG/ACT inhaler 2 puff  2 puff Inhalation Q4H PRN Cobos, Rockey Situ, MD   2 puff at 02/04/18 0756  . albuterol (PROVENTIL) (2.5 MG/3ML) 0.083% nebulizer solution 2.5 mg  2.5 mg Nebulization Q4H PRN Cobos, Rockey Situ, MD      . alum & mag hydroxide-simeth (MAALOX/MYLANTA) 200-200-20 MG/5ML suspension 30 mL  30 mL Oral Q4H PRN Kerry Hough, PA-C      . aspirin EC tablet 81 mg  81 mg Oral QPM Kerry Hough, PA-C   81 mg at 02/04/18 1720  . atorvastatin (LIPITOR) tablet 20 mg  20 mg Oral Daily Donell Sievert E, PA-C   20  mg at 02/05/18 0736  . divalproex (DEPAKOTE ER) 24 hr tablet 1,500 mg  1,500 mg Oral QHS Nwoko, Agnes I, NP   1,500 mg at 02/04/18 2110  . FLUoxetine (PROZAC) capsule 40 mg  40 mg Oral Jobe Gibbon I, NP   40 mg at 02/05/18 0736  . gabapentin (NEURONTIN) capsule 300 mg  300 mg Oral TID Armandina Stammer I, NP   300 mg at 02/05/18 0736  . hydrOXYzine (ATARAX/VISTARIL) tablet 50 mg  50 mg Oral Q6H PRN Micheal Likens, MD   50 mg at 02/05/18 0846  . ipratropium (ATROVENT) nebulizer solution 0.5 mg  0.5 mg Nebulization TID Donell Sievert E, PA-C   0.5 mg at 02/05/18 0736  . magnesium hydroxide (MILK OF MAGNESIA) suspension 30 mL  30 mL Oral Daily PRN Donell Sievert E, PA-C      . metoprolol tartrate (LOPRESSOR) tablet 50 mg  50 mg Oral BID Donell Sievert E, PA-C   50 mg at 02/05/18 0736  . mometasone-formoterol (DULERA) 200-5 MCG/ACT inhaler 2 puff  2 puff Inhalation BID Kerry Hough, PA-C   2 puff at 02/05/18 0736  . multivitamin with minerals tablet 1 tablet  1 tablet Oral Daily Kerry Hough, PA-C   1 tablet at 02/05/18 0736  . OLANZapine (ZYPREXA) tablet 10 mg  10 mg Oral QHS Armandina Stammer I, NP   10 mg at 02/04/18 2110  . thiamine (B-1) injection 100 mg  100 mg Intramuscular Once Donell Sievert E, PA-C      . thiamine (VITAMIN B-1) tablet 100 mg  100 mg Oral Daily Donell Sievert E, PA-C   100 mg at 02/05/18 0736  . traZODone (DESYREL) tablet 50 mg  50 mg Oral QHS,MR X 1 [name] A, NP   50 mg at 02/05/18 0006    Lab Results: No results found for this or any previous visit (from the past 48 hour(s)).  Blood Alcohol level:  Lab Results  Component Value Date   ETH 181 (H) 01/30/2018   ETH  07/20/2007    <5        LOWEST DETECTABLE LIMIT FOR SERUM ALCOHOL IS 11  mg/dL FOR MEDICAL PURPOSES ONLY    Metabolic Disorder Labs: No results found for: HGBA1C, MPG No results found for: PROLACTIN No results found for: CHOL, TRIG, HDL, CHOLHDL, VLDL, LDLCALC  Physical  Findings: AIMS: Facial and Oral Movements Muscles of Facial Expression: None, normal Lips and Perioral Area: None, normal Jaw: None, normal Tongue: None, normal,Extremity Movements Upper (arms, wrists, hands, fingers): None, normal Lower (legs, knees, ankles, toes): None, normal, Trunk Movements Neck, shoulders, hips: None, normal, Overall Severity Severity of abnormal movements (highest score from questions above): None, normal Incapacitation due to abnormal movements: None, normal Patient's awareness of abnormal movements (rate only patient's report): No Awareness, Dental Status Current problems with teeth and/or dentures?: No Does patient usually wear dentures?: No  CIWA:  CIWA-Ar Total: 1 COWS:  COWS Total Score: 1  Musculoskeletal: Strength & Muscle Tone: within normal limits Gait & Station: normal Patient leans: Right  Psychiatric Specialty Exam: Physical Exam  Nursing note and vitals reviewed. Constitutional: He is oriented to person, place, and time. He appears well-developed and well-nourished.  Cardiovascular: Normal rate.  Respiratory: Effort normal.  Musculoskeletal: Normal range of motion.  Neurological: He is alert and oriented to person, place, and time.  Skin: Skin is warm.    Review of Systems  Constitutional: Negative.   HENT: Negative.   Eyes: Negative.   Respiratory: Negative.   Cardiovascular: Negative.   Gastrointestinal: Negative.   Genitourinary: Negative.   Musculoskeletal: Negative.   Skin: Negative.   Neurological: Negative.   Endo/Heme/Allergies: Negative.   Psychiatric/Behavioral: Positive for depression. Negative for hallucinations and suicidal ideas. The patient is nervous/anxious.     Blood pressure 140/68, pulse 61, temperature 98.3 F (36.8 C), resp. rate 16, height 5\' 7"  (1.702 m), weight 60.3 kg (133 lb), SpO2 98 %.Body mass index is 20.83 kg/m.  General Appearance: Casual  Eye Contact:  Good  Speech:  Clear and Coherent and  Normal Rate  Volume:  Normal  Mood:  Anxious and Depressed  Affect:  Flat  Thought Process:  Goal Directed and Descriptions of Associations: Intact  Orientation:  Full (Time, Place, and Person)  Thought Content:  WDL  Suicidal Thoughts:  No  Homicidal Thoughts:  No  Memory:  Immediate;   Good Recent;   Good Remote;   Good  Judgement:  Good  Insight:  Good  Psychomotor Activity:  Normal  Concentration:  Concentration: Good and Attention Span: Good  Recall:  Good  Fund of Knowledge:  Good  Language:  Good  Akathisia:  No  Handed:  Right  AIMS (if indicated):     Assets:  Communication Skills Desire for Improvement Financial Resources/Insurance Housing Physical Health Social Support Transportation  ADL's:  Intact  Cognition:  WNL  Sleep:  Number of Hours: 5   Problems Addressed: Bipolar disorder Alcohol use disoreder  Treatment Plan Summary: Daily contact with patient to assess and evaluate symptoms and progress in treatment, Medication management and Plan is to:  -Continue Zyprexa 10 mg PO QHS for mood stability -Continue Depakote 1500 mg PO QHS for mood stability -Continue Prozac 40 mg PO Daily for mood stability -Continue Neurontin 300 mg PO TID for pain and withdrawal symptoms -Continue Vistaril 50 mg PO Q6H PRN for anxiety -Encourage group therapy participation -Requested Peer Support to counsel patient  Maryfrances Bunnell, FNP 02/05/2018, 11:47 AM

## 2018-02-05 NOTE — [facility] Group Notes (Signed)
LCSW Group Therapy Note  02/05/2018   9:00-10:00am (300 hall)    Type of Therapy and Topic:  Group Therapy: Anger Cues and Responses  Participation Level:  Active   Description of Group:   In this group, patients learned how to recognize the physical, cognitive, emotional, and behavioral responses they have to anger-provoking situations.  They identified a recent time they became angry and how they reacted.  They analyzed how their reaction was possibly beneficial and how it was possibly unhelpful.  The group discussed a variety of healthier coping skills that could help with such a situation in the future.  Deep breathing was practiced briefly.  Therapeutic Goals: 1. Patients will remember their last incident of anger and how they felt emotionally and physically, what their thoughts were at the time, and how they behaved. 2. Patients will identify how their behavior at that time worked for them, as well as how it worked against them. 3. Patients will explore possible new behaviors to use in future anger situations. 4. Patients will learn that anger itself is normal and cannot be eliminated, and that healthier reactions can assist with resolving conflict rather than worsening situations.  Summary of Patient Progress:  The patient shared that his most recent time of anger was yesterday and said his medication was taken from him, and did not want to talk about it any further.  He listened attentively although he did not contribute to the discussion.  Therapeutic Modalities:   Cognitive Behavioral Therapy  Lynnell ChadMareida J Grossman-Orr  02/05/2018 12:00pm

## 2018-02-05 NOTE — [facility] Group Notes (Signed)
Orientation / Goals   Date:  02/05/2018  Time:  10:35 AM  Type of Therapy:  Nurse Education  /   The group focuses on teaching patients who the staff is, what their staff's responsibilities are and what the programming / unit scheduling looks like.  Participation Level:  Active  Participation Quality:  Attentive  Affect:  Appropriate  Cognitive:  Appropriate  Insight:  Appropriate  Engagement in Group:  Engaged  Modes of Intervention:  Education  Summary of Progress/Problems:  Jeffrey [name], Jeffrey [name] 02/05/2018, 10:35 AM

## 2018-02-05 NOTE — [facility] Group Notes (Signed)
Identifying Needs   Date:  02/05/2018  Time:  9:22 PM  Type of Therapy:  Nurse Education  /  The group focuses on teaching patients how to identify their needs and then how to develop the skills needed ot get those needs met.  Participation Level:  Active  Participation Quality:  Attentive  Affect:  Depressed  Cognitive:  Alert  Insight:  Limited  Engagement in Group:  Lacking  Modes of Intervention:  Education  Summary of Progress/Problems:  Lauralyn Primes 02/05/2018, 9:22 PM

## 2018-02-05 NOTE — Progress Notes (Signed)
Patient did attend the evening speaker AA meeting.  

## 2018-02-05 NOTE — Progress Notes (Addendum)
D Pt is observed OOB UAL on the 300 hall today.  . A HE completed his daily assessment and on this he wrote he has experienced SI today but he contracts with Clinical research associatewriter  for safety .  He rated his depression, hopelessness and anxeity " 7/1/8", respectively.  Later this PM, he requested to  Speak to this Clinical research associatewriter, he initially told Clinical research associatewriter that he hurt his right hand when he fell ( downstairs in the gym  this evening). When writer pressed him for details, and explained to him that we would have to notify MD, he recanted , began to whisper to Clinical research associatewriter and said " that 's not the truth..I can't lie to you.Marland Kitchen.Marland Kitchen.I didn't fall ..I punched the wall because I got po'd...the patient explained he got his feelings hurt when nursing instructor spoke / acknowledged other people sitting in dayroom and skipped over him ( at the time he had breathing treatment apparatus in his mouth).  He told Clinical research associatewriter that he intitally was awae that " I got really po'd./. And finaly I got up and went to my room to finish the breathing treatment." Patient explained he had gone back to clinical instructor, apologized to her " for being so stupid " and then he punched wall with his  Right hand'''I was really pissed off". Patient stated he ' didn't know "' ..abdominal tenderness the time...why he got his feelings hurt, but that he figured out later in the evening that he felt disrespected when clinical instructor had ignore him.Patient's right hand without visible injury. No laceration , bruise, hematoma identified. Pt able to move all fingers, right hand equal temperature compared to left hand. Nail beds equal color. No edema, swelling observed. Patient given ice pack and instructed to apply to top of hand and to notify staff if he experiences changes in his hand. He stated he understood. Patient stated " I'm sorry I lied to you and din't tell you the truth.Marland Kitchen.ibuprofen don't know what's got over me..ibuprofen feel stupid". Elvera Lennox. R Writer notified AC (TT) immediately,  that pt had stated he fell while in the gym and then changed his statement to he had punched the wall when he felt disrespected . Safety in place.

## 2018-02-05 NOTE — Progress Notes (Signed)
D: Pt pleasant and cooperative.  Pt reports feeling tremors in his chest and abdomen.  Pt went to group and seen interacting with peers.  Pt reports having trouble going to sleep and was given 2 doses of Trazodone.  Pt jerked head around once as if he was looking at something that wasn't there but this action was not observed any other time, when asked about if he had seen something pt got quiet and said he was fine, however pt is hard of hearing. A: Pt given prescribed medications along with PRN trazodone for sleep.  Pt encouraged to get a good nights rest. R: 15 minute safety checks.  Pt seen wearing non-skid socks and wearing high fall risk arm band.  Pt has padding on floor on the right side of the bed to prevent injuries if he falls off side of the bed.

## 2018-02-05 NOTE — Progress Notes (Signed)
D.  Pt pleasant on approach, denies complaints at this time.  Pt was positive for evening AA group, observed interacting appropriately with peers on the unit.  Pt denies SI/HI/AVH at this time.  A.  Support and encouragement offered, medications given as ordered  R.  Pt remains safe on the unit, will continue to monitor.

## 2018-02-05 NOTE — Progress Notes (Signed)
Nursing Progress Note [id number]  D) Patient verbally agrees to work with Theatre stage managernursing student. Patient denies SI/HI/AVH or pain.  A) Patient educated about and provided medication as scheduled or requested per provider's orders under supervision of RN. Patient safety maintained with q15 min safety checks. High fall risk precautions in place. Emotional support given. 1:1 interaction and active listening provided. Snacks and fluids provided. Labs, vital signs and patient behavior monitored throughout shift. Patient encouraged to work on treatment plan.  R) Patient remains safe on the unit at this time. Patient agrees to make needs known to staff. Will continue to monitor and assess for changes.

## 2018-02-06 MED ORDER — GABAPENTIN 400 MG PO CAPS
400.0000 mg | ORAL_CAPSULE | Freq: Three times a day (TID) | ORAL | Status: DC
  Administered 2018-02-06 – 2018-02-08 (×7): 400 mg via ORAL
  Filled 2018-02-06 (×3): qty 1
  Filled 2018-02-06: qty 21
  Filled 2018-02-06 (×3): qty 1
  Filled 2018-02-06: qty 21
  Filled 2018-02-06 (×3): qty 1
  Filled 2018-02-06: qty 21

## 2018-02-06 MED ORDER — HYDROXYZINE HCL 50 MG PO TABS
50.0000 mg | ORAL_TABLET | Freq: Once | ORAL | Status: AC
  Administered 2018-02-06: 50 mg via ORAL
  Filled 2018-02-06 (×2): qty 1

## 2018-02-06 NOTE — [facility] Group Notes (Signed)
  Identifying Anger  Date:  02/06/2018  Time:  11:36 AM  Type of Therapy:  Nurse Education  /  The group focuses on teaching patients that anger is a secondary emotion, that it is neither good nor bad and that it is the result of a primary emotion.   Participation Level:  Active  Participation Quality:  Appropriate  Affect:  Appropriate  Cognitive:  Appropriate  Insight:  Good  Engagement in Group:  Engaged  Modes of Intervention:  Education  Summary of Progress/Problems:  Rich BraveDuke, Jarae Panas Lynn 02/06/2018, 11:36 AM

## 2018-02-06 NOTE — Progress Notes (Signed)
Patient did attend the evening speaker AA meeting.  

## 2018-02-06 NOTE — [facility] Group Notes (Signed)
[facility]  Date/Time:  02/06/2018 10:00-11:00AM  Type of Therapy and Topic:  Group Therapy:  Healthy and Unhealthy Supports  Participation Level:  Active   Description of Group:  Patients in this group were introduced to the idea of adding a variety of healthy supports to address the various needs in their lives.Patients discussed what additional healthy supports could be helpful in their recovery and wellness after discharge in order to prevent future hospitalizations.   An emphasis was placed on using counselor, doctor, therapy groups, 12-step groups, and problem-specific support groups to expand supports.  They also worked as a group on developing a specific plan for several patients to deal with unhealthy supports through boundary-setting, psychoeducation with loved ones, and even termination of relationships.   Therapeutic Goals:   1)  discuss importance of adding supports to stay well once out of the hospital  2)  compare healthy versus unhealthy supports and identify some examples of each  3)  generate ideas and descriptions of healthy supports that can be added  4)  offer mutual support about how to address unhealthy supports  5)  encourage active participation in and adherence to discharge plan    Summary of Patient Progress:  The patient expressed a willingness to add his children to help in his recovery journey.  When another patient was angry and ruminating about loss of children to [employer], he shared that the same thing had happened to him.  As the discussion evolved and others were encouraging the other patient to think about what is best for the children and not to focus on blaming CPS, he disagreed and said that she should blame CPS.  CSW asked if that would change anything in her situation, to have someone to blame, and he was able to articulate that in fact this would not be effective in changing anything, and only the pursuit of  sobriety and mental stability will do that.   Therapeutic Modalities:   Motivational Interviewing Brief Solution-Focused Therapy  Ambrose MantleMareida Grossman-Orr, LCSW

## 2018-02-06 NOTE — Tx Team (Signed)
D "Jeffrey [name]" as this patient requests this Clinical research associatewriter to call him, is seen wearing patient scrubs, leaning against the wall and looking generally unhappy and disinterested. A He completed his daily assessment and on this he wrote he has experienced SI today but he is willing to contract with this writer to not hurt himself. He rates his depression, hopelessness and anxiety " 6/8/5", respectively. He says to this Clinical research associatewriter" I'm so sorry that I lied to you yesterday...about getting mad at the nursing instructor..I hope you're not mad at me...". Writer told patient ( again) that there is no malice on writer's part, that patient has a right to his feelings and that he has nothing to apologize for . Patient states " my hands are still trembling". Patient shows his right hand to this Clinical research associatewriter and says " see...there's nothing wrong with my hand...you can't even tell I hit the wall". Writer ( again) reinforced to the patient to seek writer's assistance  and help if he begins to have feelings of hurt, anger, disappointment, etcc..before he acts out. Patient states that he will. R Safety is in place and poc cont.

## 2018-02-06 NOTE — Progress Notes (Signed)
[facility] MD Progress Note  02/06/2018 8:22 AM TRAVION KE  MRN:  [id number]   Subjective:   Madyx reports " I am feeling and doing better than when I came in."  Objective: NESTOR WIENEKE is awake, alert and oriented. Seen standing at nursing station. Livio continues to express concerns with anxiety and "shaking" on the inside.  Denies suicidal or homicidal ideation. Denies auditory hallucination and does not appear to be responding to internal stimuli. Cohl reports he has seen "shadows"  Patient reports he is  Interacting  well with staff and others. Patient reports he is medication compliant without mediation side effects.   Discussed adjusting Gabapentin from 300 mg to 400 mg. Support, encouragement and reassurance was provided.   Principal Problem: Bipolar disorder with severe depression (HCC) Diagnosis:   Patient Active Problem List   Diagnosis Date Noted  . Bipolar disorder with severe depression (HCC) [F31.4] 02/01/2018  . Alcohol use disorder, severe, dependence (HCC) [F10.20] 02/01/2018  . Cannabis use disorder, severe, dependence (HCC) [F12.20] 02/01/2018  . MDD (major depressive disorder), recurrent episode, severe (HCC) [F33.2] 01/31/2018  . Internal and external bleeding hemorrhoids [K64.4, K64.8]   . Rectal bleeding [K62.5]   . Iron deficiency anemia due to chronic blood loss [D50.0] 03/09/2017  . Anemia [D64.9]   . GIB (gastrointestinal bleeding) [K92.2] 02/07/2017  . Acute blood loss anemia [D62] 02/07/2017  . Weakness generalized [R53.1] 02/07/2017  . Dizziness [R42] 02/07/2017  . Peptic ulcer disease [K27.9]   . Bipolar disorder (HCC) [F31.9]   . 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 Dyspnea [R06.00] 08/26/2016  . H/O: depression [Z86.59] 08/26/2016  . Dyslipidemia [E78.5] 08/26/2016  . Tobacco use disorder [F17.200] 08/26/2016  . [name] (peripheral vascular disease) (HCC) [I73.9] 06/18/2015  . Chronic diarrhea [K52.9] 05/02/2013  . Hematochezia [K92.1]  05/02/2013  . Abnormal weight loss [R63.4] 05/02/2013  . Abdominal pain, periumbilical [R10.33] 05/02/2013   Total Time spent with patient: 15 minutes  Past Psychiatric History: See H&P  Past Medical History:  Past Medical History:  Diagnosis Date  . Acute blood loss anemia 02/07/2017  . Anxiety   . Arthritis    deg disease, bulging disk,  shoulder level  . Bipolar disorder (HCC)   . Bipolar disorder (HCC)   . 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 Depression    anxiety  . Hyperlipidemia   . Hypertension   . Peptic ulcer disease    Review  . Pneumonia   . [name] (peripheral vascular disease) (HCC) 06/18/2015  . Shortness of breath     Past Surgical History:  Procedure Laterality Date  . BIOPSY  02/09/2017   Procedure: BIOPSY;  Surgeon: [name], MD;  Location: [facility];  Service: Endoscopy;;  duodenal gastric  . COLONOSCOPY  03/14/2007   ION:GEXBMW colonoscopy and terminal ileoscopy except external hemorrhoids  . COLONOSCOPY N/A 05/05/2013   [name]: external/internal anal canal hemorrhoids, unable to intubate TI, segemental biopsies unremarkable   . COLONOSCOPY N/A 04/11/2017   Procedure: COLONOSCOPY;  Surgeon: [name], MD;  Location: [facility];  Service: Endoscopy;  Laterality: N/A;  . COLONOSCOPY WITH PROPOFOL N/A 03/31/2017   Procedure: COLONOSCOPY WITH PROPOFOL;  Surgeon: [name], MD;  Location: [facility];  Service: Endoscopy;  Laterality: N/A;  1:45pm  . ESOPHAGOGASTRODUODENOSCOPY  03/14/2007   UXL:[id number] antral gastritis with bulbar duodenitis/paucity to postbulbar duodenal folds and biopsy were benign with no evidence of villous atrophy.  . ESOPHAGOGASTRODUODENOSCOPY (EGD) WITH  PROPOFOL N/A 02/09/2017   Procedure: ESOPHAGOGASTRODUODENOSCOPY (EGD) WITH PROPOFOL;  Surgeon: [name], MD;  [facility]: [facility];  Service: Endoscopy;  Laterality: N/A;  . GIVENS CAPSULE STUDY N/A 04/08/2017   Procedure: GIVENS CAPSULE STUDY;  Surgeon:  [name], MD;  Location: [facility];  Service: Endoscopy;  Laterality: N/A;  . HAND SURGERY     left, secondary to self-inflicted laceration  . HEMORRHOID SURGERY N/A 04/14/2017   Procedure: HEMORRHOIDECTOMY;  Surgeon: [name], MD;  Location: [facility];  Service: General;  Laterality: N/A;  . SHOULDER SURGERY     right  . TOE SURGERY     left great toe , amputated-lawnmover accident   Family History:  Family History  Problem Relation Age of Onset  . Breast cancer Mother        deceased  . Heart disease Father   . Depression Daughter   . Anxiety disorder Daughter   . Anxiety disorder Son   . Depression Son   . Asthma Brother   . Heart attack Maternal Aunt   . Heart attack Maternal Uncle   . Heart attack Paternal Aunt   . Heart attack Paternal Uncle   . Heart attack Maternal Grandmother   . Heart attack Maternal Grandfather   . Emphysema Maternal Grandfather   . Heart attack Paternal Grandmother   . Heart attack Paternal Grandfather   . Colon cancer Neg Hx   . Liver disease Neg Hx    Family Psychiatric  History: See H&P Social History:  Social History   Substance and Sexual Activity  Alcohol Use No  . Alcohol/week: 0.0 oz     Social History   Substance and Sexual Activity  Drug Use Yes  . Types: Marijuana   Comment: most days     Social History   Socioeconomic History  . Marital status: Divorced    Spouse name: None  . Number of children: 2  . Years of education: None  . Highest education level: None  Social Needs  . Financial resource strain: None  . Food insecurity - worry: None  . Food insecurity - inability: None  . Transportation needs - medical: None  . Transportation needs - non-medical: None  Occupational History  . Occupation: Disabled  Tobacco Use  . Smoking status: Current Every Day Smoker    Packs/day: 1.00    Years: 40.00    Pack years: 40.00    Types: Cigarettes    Start date: 08/04/1971  . Smokeless tobacco: Never Used  .  Tobacco comment: peak rate of 2.5ppd, 1/2ppd on 11/27/2016 -- 6 cigarettes / day 01/25/18  Substance and Sexual Activity  . Alcohol use: No    Alcohol/week: 0.0 oz  . Drug use: Yes    Types: Marijuana    Comment: most days   . Sexual activity: No  Other Topics Concern  . None  Social History Narrative   Originally from [place]. Previously has lived in [facility] [place] 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 Questionable asbestos exposure. Does have significant exposure to fumes. No mold exposure. No bird exposure. No pets currently.    Additional Social History:                         Sleep: Good  Appetite:  Good  Current Medications: Current Facility-Administered Medications  Medication Dose Route Frequency Provider Last Rate Last  Dose  . acetaminophen (TYLENOL) tablet 650 mg  650 mg Oral Q6H PRN Kerry Hough, PA-C   650 mg at 02/03/18 0757  . albuterol (PROVENTIL HFA;VENTOLIN HFA) 108 (90 Base) MCG/ACT inhaler 2 puff  2 puff Inhalation Q4H PRN Cobos, Rockey Situ, MD   2 puff at 02/06/18 0749  . albuterol (PROVENTIL) (2.5 MG/3ML) 0.083% nebulizer solution 2.5 mg  2.5 mg Nebulization Q4H PRN Cobos, Rockey Situ, MD      . alum & mag hydroxide-simeth (MAALOX/MYLANTA) 200-200-20 MG/5ML suspension 30 mL  30 mL Oral Q4H PRN Kerry Hough, PA-C      . aspirin EC tablet 81 mg  81 mg Oral QPM Donell Sievert E, PA-C   81 mg at 02/05/18 1813  . atorvastatin (LIPITOR) tablet 20 mg  20 mg Oral Daily Donell Sievert E, PA-C   20 mg at 02/06/18 0749  . divalproex (DEPAKOTE ER) 24 hr tablet 1,500 mg  1,500 mg Oral QHS Nwoko, Agnes I, NP   1,500 mg at 02/05/18 2107  . FLUoxetine (PROZAC) capsule 40 mg  40 mg Oral Jobe Gibbon I, NP   40 mg at 02/06/18 4098  . gabapentin (NEURONTIN) capsule 400 mg  400 mg Oral TID Oneta Rack, NP      . hydrOXYzine (ATARAX/VISTARIL) tablet 50 mg  50 mg Oral Q6H PRN Micheal Likens,  MD   50 mg at 02/05/18 2004  . ipratropium (ATROVENT) nebulizer solution 0.5 mg  0.5 mg Nebulization TID Donell Sievert E, PA-C   0.5 mg at 02/06/18 0749  . magnesium hydroxide (MILK OF MAGNESIA) suspension 30 mL  30 mL Oral Daily PRN Donell Sievert E, PA-C      . metoprolol tartrate (LOPRESSOR) tablet 50 mg  50 mg Oral BID Donell Sievert E, PA-C   50 mg at 02/06/18 0749  . mometasone-formoterol (DULERA) 200-5 MCG/ACT inhaler 2 puff  2 puff Inhalation BID Kerry Hough, PA-C   2 puff at 02/06/18 0750  . multivitamin with minerals tablet 1 tablet  1 tablet Oral Daily Kerry Hough, PA-C   1 tablet at 02/06/18 0749  . OLANZapine (ZYPREXA) tablet 10 mg  10 mg Oral QHS Armandina Stammer I, NP   10 mg at 02/05/18 2108  . thiamine (B-1) injection 100 mg  100 mg Intramuscular Once Donell Sievert E, PA-C      . thiamine (VITAMIN B-1) tablet 100 mg  100 mg Oral Daily Donell Sievert E, PA-C   100 mg at 02/06/18 0749  . traZODone (DESYREL) tablet 50 mg  50 mg Oral QHS,MR X 1 [name] A, NP   50 mg at 02/05/18 2108    Lab Results: No results found for this or any previous visit (from the past 48 hour(s)).  Blood Alcohol level:  Lab Results  Component Value Date   ETH 181 (H) 01/30/2018   ETH  07/20/2007    <5        LOWEST DETECTABLE LIMIT FOR SERUM ALCOHOL IS 11 mg/dL FOR MEDICAL PURPOSES ONLY    Metabolic Disorder Labs: No results found for: HGBA1C, MPG No results found for: PROLACTIN No results found for: CHOL, TRIG, HDL, CHOLHDL, VLDL, LDLCALC  Physical Findings: AIMS: Facial and Oral Movements Muscles of Facial Expression: None, normal Lips and Perioral Area: None, normal Jaw: None, normal Tongue: None, normal,Extremity Movements Upper (arms, wrists, hands, fingers): None, normal Lower (legs, knees, ankles, toes): None, normal, Trunk Movements Neck, shoulders, hips: None, normal, Overall  Severity Severity of abnormal movements (highest score from questions above): None,  normal Incapacitation due to abnormal movements: None, normal Patient's awareness of abnormal movements (rate only patient's report): No Awareness, Dental Status Current problems with teeth and/or dentures?: No Does patient usually wear dentures?: No  CIWA:  CIWA-Ar Total: 6 COWS:  COWS Total Score: 1  Musculoskeletal: Strength & Muscle Tone: within normal limits Gait & Station: normal Patient leans: Right  Psychiatric Specialty Exam: Physical Exam  Nursing note and vitals reviewed. Constitutional: He is oriented to person, place, and time. He appears well-developed and well-nourished.  Cardiovascular: Normal rate.  Respiratory: Effort normal.  Musculoskeletal: Normal range of motion.  Neurological: He is alert and oriented to person, place, and time.  Skin: Skin is warm.    Review of Systems  Constitutional: Negative.   HENT: Negative.   Eyes: Negative.   Respiratory: Negative.   Cardiovascular: Negative.   Gastrointestinal: Negative.   Genitourinary: Negative.   Musculoskeletal: Negative.   Skin: Negative.   Neurological: Negative.   Endo/Heme/Allergies: Negative.   Psychiatric/Behavioral: Positive for depression. Negative for hallucinations and suicidal ideas. The patient is nervous/anxious.     Blood pressure 140/68, pulse 61, temperature 98.3 F (36.8 C), resp. rate 16, height 5\' 7"  (1.702 m), weight 60.3 kg (133 lb), SpO2 98 %.Body mass index is 20.83 kg/m.  General Appearance: Casual paper scrubs, smiling, pleasant and cooperative   Eye Contact:  Good  Speech:  Clear and Coherent and Normal Rate  Volume:  Normal  Mood:  Anxious and Depressed  Affect:  Appropriate and Congruent  Thought Process:  Goal Directed and Descriptions of Associations: Intact  Orientation:  Full (Time, Place, and Person)  Thought Content:  WDL  Suicidal Thoughts:  No  Homicidal Thoughts:  No  Memory:  Immediate;   Good Recent;   Good Remote;   Good  Judgement:  Good  Insight:  Good   Psychomotor Activity:  Normal  Concentration:  Concentration: Good and Attention Span: Good  Recall:  Good  Fund of Knowledge:  Good  Language:  Good  Akathisia:  No  Handed:  Right  AIMS (if indicated):     Assets:  Desire for Improvement Financial Resources/Insurance Housing Physical Health Social Support Transportation  ADL's:  Intact  Cognition:  WNL  Sleep:  Number of Hours: 6.25   Problems Addressed: Bipolar disorder Alcohol use disoreder  Treatment Plan Summary: Daily contact with patient to assess and evaluate symptoms and progress in treatment, Medication management and Plan is to:   Continue with current treatment plan on 02/06/2018 except where noted   -Continue Zyprexa 10 mg PO QHS for mood stability -Continue Depakote 1500 mg PO QHS for mood stability -Continue Prozac 40 mg PO Daily for mood stability - Increased  Neurontin 300 mg to 400 mg  PO TID for pain and withdrawal symptoms -Continue Vistaril 50 mg PO Q6H PRN for anxiety -Encourage group therapy participation -Requested Peer Support to counsel patient   Oneta Rackanika N Aniqa Hare, NP 02/06/2018, 8:22 AM

## 2018-02-06 NOTE — [facility] Group Notes (Signed)
[facility] Group Notes:  (Nursing)  Date:  02/06/2018  Time: 8:30 Type of Therapy:  Orientation  Participation Level:  Active  Participation Quality:  Appropriate  Affect:  Appropriate  Cognitive:  Alert and Appropriate  Insight:  Appropriate and Good  Engagement in Group:  Engaged  Modes of Intervention:  Discussion and Orientation  Summary of Progress/Problems: Patient was attentive and engaged during orientation group this am  Shela NevinValerie S Estel Tonelli 02/06/2018, 3:47 PM

## 2018-02-06 NOTE — Progress Notes (Signed)
D.  Pt pleasant but anxious on approach, concerned he would not be able to sleep without his Vistaril at night.  Pt did attend evening AA group with appropriate participation.  Pt was observed engaging appropriately with peers on the unit.  Pt denies SI/HI/AVH at this time.  Pt remains tremulous but states this is due to anxiety rather than withdrawal.  A.  Support and encouragement offered, NP notified of concern about Vistaril and new orders received.  Medication given as ordered  R.  Pt remains safe on the unit, will continue to monitor.

## 2018-02-07 NOTE — Progress Notes (Signed)
Recreation Therapy Notes  Date: 3.18.19 Time: 9:30 a.m. Location: [address] Dayroom  Group Topic: Stress Management  Goal Area(s) Addresses:  Goal 1.1: To reduce stress  -Patient will report feeling a reduction in stress level  -Patient will identify the importance of stress management  -Patient will participate during stress management group treatment    Behavioral Response: Engaged  Intervention: Stress Management  Activity: Meditattion- Patients were in a peaceful environment with soft lighting enhancing patients mood.   Education: Stress Management, Discharge Planning.   Education Outcome: Acknowledges edcuation/In group clarification offered/Needs additional education  Clinical Observations/Feedback:: Patient attended and participated appropriately during stress management group treatment. Patient reported feeling a reduction in stress level.   Jeffrey [name], Recreation Therapy Intern   Jeffrey [name] 02/07/2018 12:26 PM

## 2018-02-07 NOTE — [facility] Group Notes (Signed)
Orientation / Goals   Date:  02/07/2018  Time:  9:02 AM  Type of Therapy:  Nurse Education  /  The group focuses on teaching patients who their staff is, what the staff's responsibilities are and what the unit programming / scheduling looks like.  Participation Level:  Active  Participation Quality:  Attentive  Affect:  Appropriate  Cognitive:  Alert  Insight:  Good  Engagement in Group:  Engaged  Modes of Intervention:  Education  Summary of Progress/Problems:  Jeffrey [name], Jeffrey [name] 02/07/2018, 9:02 AM

## 2018-02-07 NOTE — Progress Notes (Signed)
[facility]  02/07/2018 2:14 PM Marni Griffondward T Fikes  MRN:  [id number]   Subjective:   Ramon Dredgedward reports "  I am not ready to go, I feel like I need to express my feelings."  Objective: Marni GriffonEdward T Osbourn seen attending group session. Reports is mood has improved slightly, however feels he still need to express his "true feeling" to someone.  Patient reports he has used this time inpatient to journal his thoughts and feeling about his childhood and witness deaths. Ramon Dredgedward is requesting to speak to Child psychotherapistsocial worker.  Denies suicidal or homicidal ideation during this assessment. Reports he feels more depressed than anything.  Denies auditory hallucination and does not appear to be responding to internal stimuli. Patient reports he is medication compliant and states he is tolerating medications well. Reports the gabapentin has helped with symptoms. Support, encouragement and reassurance was provided.   Principal Problem: Bipolar disorder with severe depression (HCC) Diagnosis:   Patient Active Problem List   Diagnosis Date Noted  . Bipolar disorder with severe depression (HCC) [F31.4] 02/01/2018  . Alcohol use disorder, severe, dependence (HCC) [F10.20] 02/01/2018  . Cannabis use disorder, severe, dependence (HCC) [F12.20] 02/01/2018  . MDD (major depressive disorder), recurrent episode, severe (HCC) [F33.2] 01/31/2018  . Internal and external bleeding hemorrhoids [K64.4, K64.8]   . Rectal bleeding [K62.5]   . Iron deficiency anemia due to chronic blood loss [D50.0] 03/09/2017  . Anemia [D64.9]   . GIB (gastrointestinal bleeding) [K92.2] 02/07/2017  . Acute blood loss anemia [D62] 02/07/2017  . Weakness generalized [R53.1] 02/07/2017  . Dizziness [R42] 02/07/2017  . Peptic ulcer disease [K27.9]   . Bipolar disorder (HCC) [F31.9]   . 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 Dyspnea [R06.00] 08/26/2016  . H/O: depression [Z86.59] 08/26/2016  . Dyslipidemia [E78.5] 08/26/2016  . Tobacco use  disorder [F17.200] 08/26/2016  . [name] (peripheral vascular disease) (HCC) [I73.9] 06/18/2015  . Chronic diarrhea [K52.9] 05/02/2013  . Hematochezia [K92.1] 05/02/2013  . Abnormal weight loss [R63.4] 05/02/2013  . Abdominal pain, periumbilical [R10.33] 05/02/2013   Total Time spent with patient: 15 minutes  Past Psychiatric History: See H&P  Past Medical History:  Past Medical History:  Diagnosis Date  . Acute blood loss anemia 02/07/2017  . Anxiety   . Arthritis    deg disease, bulging disk,  shoulder level  . Bipolar disorder (HCC)   . Bipolar disorder (HCC)   . 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 Depression    anxiety  . Hyperlipidemia   . Hypertension   . Peptic ulcer disease    Review  . Pneumonia   . [name] (peripheral vascular disease) (HCC) 06/18/2015  . Shortness of breath     Past Surgical History:  Procedure Laterality Date  . BIOPSY  02/09/2017   Procedure: BIOPSY;  Surgeon: West BaliSandi L Fields, MD;  [facility];  Service: Endoscopy;;  duodenal gastric  . COLONOSCOPY  03/14/2007   XLK:GMWNUUR:Normal colonoscopy and terminal ileoscopy except external hemorrhoids  . COLONOSCOPY N/A 05/05/2013   Dr. Jena Gaussourk: external/internal anal canal hemorrhoids, unable to intubate TI, segemental biopsies unremarkable   . COLONOSCOPY N/A 04/11/2017   Procedure: COLONOSCOPY;  Surgeon: West BaliFields, Sandi L, MD;  [facility];  Service: Endoscopy;  Laterality: N/A;  . COLONOSCOPY WITH PROPOFOL N/A 03/31/2017   Procedure: COLONOSCOPY WITH PROPOFOL;  Surgeon: Corbin Adeourk, Robert M, MD;  [facility];  Service: Endoscopy;  Laterality: N/A;  1:45pm  . ESOPHAGOGASTRODUODENOSCOPY  03/14/2007  WUX:[id number] antral gastritis with bulbar duodenitis/paucity to postbulbar duodenal folds and biopsy were benign with no evidence of villous atrophy.  . ESOPHAGOGASTRODUODENOSCOPY (EGD) WITH PROPOFOL N/A 02/09/2017   Procedure: ESOPHAGOGASTRODUODENOSCOPY (EGD) WITH PROPOFOL;  Surgeon: [name],  MD;  [facility];  Service: Endoscopy;  Laterality: N/A;  . GIVENS CAPSULE STUDY N/A 04/08/2017   Procedure: GIVENS CAPSULE STUDY;  Surgeon: [name], MD;  [facility];  Service: Endoscopy;  Laterality: N/A;  . HAND SURGERY     left, secondary to self-inflicted laceration  . HEMORRHOID SURGERY N/A 04/14/2017   Procedure: HEMORRHOIDECTOMY;  Surgeon: [name], MD;  Location: [facility];  Service: General;  Laterality: N/A;  . SHOULDER SURGERY     right  . TOE SURGERY     left great toe , amputated-lawnmover accident   Family History:  Family History  Problem Relation Age of Onset  . Breast cancer Mother        deceased  . Heart disease Father   . Depression Daughter   . Anxiety disorder Daughter   . Anxiety disorder Son   . Depression Son   . Asthma Brother   . Heart attack Maternal Aunt   . Heart attack Maternal Uncle   . Heart attack Paternal Aunt   . Heart attack Paternal Uncle   . Heart attack Maternal Grandmother   . Heart attack Maternal Grandfather   . Emphysema Maternal Grandfather   . Heart attack Paternal Grandmother   . Heart attack Paternal Grandfather   . Colon cancer Neg Hx   . Liver disease Neg Hx    Family Psychiatric  History: See H&P Social History:  Social History   Substance and Sexual Activity  Alcohol Use No  . Alcohol/week: 0.0 oz     Social History   Substance and Sexual Activity  Drug Use Yes  . Types: Marijuana   Comment: most days     Social History   Socioeconomic History  . Marital status: Divorced    Spouse name: None  . Number of children: 2  . Years of education: None  . Highest education level: None  Social Needs  . Financial resource strain: None  . Food insecurity - worry: None  . Food insecurity - inability: None  . Transportation needs - medical: None  . Transportation needs - non-medical: None  Occupational History  . Occupation: Disabled  Tobacco Use  . Smoking status: Current Every  Day Smoker    Packs/day: 1.00    Years: 40.00    Pack years: 40.00    Types: Cigarettes    Start date: 08/04/1971  . Smokeless tobacco: Never Used  . Tobacco comment: peak rate of 2.5ppd, 1/2ppd on 11/27/2016 -- 6 cigarettes / day 01/25/18  Substance and Sexual Activity  . Alcohol use: No    Alcohol/week: 0.0 oz  . Drug use: Yes    Types: Marijuana    Comment: most days   . Sexual activity: No  Other Topics Concern  . None  Social History Narrative   Originally from [place]. Previously has lived in [facility] [place] 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 Questionable asbestos exposure. Does have significant exposure to fumes. No mold exposure. No bird exposure. No pets currently.    Additional Social History:  Sleep: Good  Appetite:  Good  Current Medications: Current Facility-Administered Medications  Medication Dose Route Frequency Provider Last Rate Last Dose  . acetaminophen (TYLENOL) tablet 650 mg  650 mg Oral Q6H PRN Kerry Hough, PA-C   650 mg at 02/07/18 0604  . albuterol (PROVENTIL HFA;VENTOLIN HFA) 108 (90 Base) MCG/ACT inhaler 2 puff  2 puff Inhalation Q4H PRN Cobos, Rockey Situ, MD   2 puff at 02/06/18 0749  . albuterol (PROVENTIL) (2.5 MG/3ML) 0.083% nebulizer solution 2.5 mg  2.5 mg Nebulization Q4H PRN Cobos, Rockey Situ, MD      . alum & mag hydroxide-simeth (MAALOX/MYLANTA) 200-200-20 MG/5ML suspension 30 mL  30 mL Oral Q4H PRN Kerry Hough, PA-C      . aspirin EC tablet 81 mg  81 mg Oral QPM Donell Sievert E, PA-C   81 mg at 02/06/18 1709  . atorvastatin (LIPITOR) tablet 20 mg  20 mg Oral Daily Kerry Hough, PA-C   20 mg at 02/07/18 0801  . divalproex (DEPAKOTE ER) 24 hr tablet 1,500 mg  1,500 mg Oral QHS Nwoko, Agnes I, NP   1,500 mg at 02/06/18 2225  . FLUoxetine (PROZAC) capsule 40 mg  40 mg Oral Jobe Gibbon I, NP   40 mg at 02/07/18 0603  . gabapentin  (NEURONTIN) capsule 400 mg  400 mg Oral TID Oneta Rack, NP   400 mg at 02/07/18 1206  . hydrOXYzine (ATARAX/VISTARIL) tablet 50 mg  50 mg Oral Q6H PRN Micheal Likens, MD   50 mg at 02/07/18 1207  . ipratropium (ATROVENT) nebulizer solution 0.5 mg  0.5 mg Nebulization TID Donell Sievert E, PA-C   0.5 mg at 02/07/18 1206  . magnesium hydroxide (MILK OF MAGNESIA) suspension 30 mL  30 mL Oral Daily PRN Donell Sievert E, PA-C      . metoprolol tartrate (LOPRESSOR) tablet 50 mg  50 mg Oral BID Donell Sievert E, PA-C   50 mg at 02/07/18 0800  . mometasone-formoterol (DULERA) 200-5 MCG/ACT inhaler 2 puff  2 puff Inhalation BID Kerry Hough, PA-C   2 puff at 02/07/18 0759  . multivitamin with minerals tablet 1 tablet  1 tablet Oral Daily Kerry Hough, PA-C   1 tablet at 02/07/18 0800  . OLANZapine (ZYPREXA) tablet 10 mg  10 mg Oral QHS Armandina Stammer I, NP   10 mg at 02/06/18 2225  . thiamine (B-1) injection 100 mg  100 mg Intramuscular Once Donell Sievert E, PA-C      . thiamine (VITAMIN B-1) tablet 100 mg  100 mg Oral Daily Donell Sievert E, PA-C   100 mg at 02/07/18 0801  . traZODone (DESYREL) tablet 50 mg  50 mg Oral QHS,MR X 1 [name] A, NP   50 mg at 02/06/18 2225    Lab Results: No results found for this or any previous visit (from the past 48 hour(s)).  Blood Alcohol level:  Lab Results  Component Value Date   ETH 181 (H) 01/30/2018   ETH  07/20/2007    <5        LOWEST DETECTABLE LIMIT FOR SERUM ALCOHOL IS 11 mg/dL FOR MEDICAL PURPOSES ONLY    Metabolic Disorder Labs: No results found for: HGBA1C, MPG No results found for: PROLACTIN No results found for: CHOL, TRIG, HDL, CHOLHDL, VLDL, LDLCALC  Physical Findings: AIMS: Facial and Oral Movements Muscles of Facial Expression: None, normal Lips and Perioral Area: None, normal Jaw: None, normal Tongue: None,  normal,Extremity Movements Upper (arms, wrists, hands, fingers): None, normal Lower (legs, knees,  ankles, toes): None, normal, Trunk Movements Neck, shoulders, hips: None, normal, Overall Severity Severity of abnormal movements (highest score from questions above): None, normal Incapacitation due to abnormal movements: None, normal Patient's awareness of abnormal movements (rate only patient's report): No Awareness, Dental Status Current problems with teeth and/or dentures?: No Does patient usually wear dentures?: No  CIWA:  CIWA-Ar Total: 6 COWS:  COWS Total Score: 1  Musculoskeletal: Strength & Muscle Tone: within normal limits Gait & Station: normal Patient leans: Right  Psychiatric Specialty Exam: Physical Exam  Nursing note and vitals reviewed. Constitutional: He is oriented to person, place, and time. He appears well-developed and well-nourished.  Cardiovascular: Normal rate.  Respiratory: Effort normal.  Musculoskeletal: Normal range of motion.  Neurological: He is alert and oriented to person, place, and time.  Skin: Skin is warm.    Review of Systems  Neurological: Positive for tremors.  Psychiatric/Behavioral: Positive for depression. Negative for hallucinations and suicidal ideas. The patient is nervous/anxious.     Blood pressure 117/64, pulse 73, temperature 98.4 F (36.9 C), resp. rate 16, height 5\' 7"  (1.702 m), weight 60.3 kg (133 lb), SpO2 98 %.Body mass index is 20.83 kg/m.  General Appearance: Casual paper scrubs, smiling, pleasant and cooperative   Eye Contact:  Good  Speech:  Clear and Coherent and Normal Rate  Volume:  Normal  Mood:  Anxious and Depressed  Affect:  Appropriate and Congruent  Thought Process:  Goal Directed and Descriptions of Associations: Intact  Orientation:  Full (Time, Place, and Person)  Thought Content:  WDL  Suicidal Thoughts:  No  Homicidal Thoughts:  No  Memory:  Immediate;   Good Recent;   Good Remote;   Good  Judgement:  Good  Insight:  Good  Psychomotor Activity:  Normal  Concentration:  Concentration: Good and  Attention Span: Good  Recall:  Good  Fund of Knowledge:  Good  Language:  Good  Akathisia:  No  Handed:  Right  AIMS (if indicated):     Assets:  Desire for Improvement Financial Resources/Insurance Housing Physical Health Social Support Transportation  ADL's:  Intact  Cognition:  WNL  Sleep:  Number of Hours: 5   Problems Addressed: Bipolar disorder Alcohol use disorder  Treatment Plan Summary: Daily contact with patient to assess and evaluate symptoms and progress in treatment, Medication management and Plan is to:   Continue with current treatment plan on 02/07/2018 except where noted   -Continue Zyprexa 10 mg PO QHS for mood stability -Continue Depakote 1500 mg PO QHS for mood stability -Continue Prozac 40 mg PO Daily for mood stability - Continue  Neurontin  400 mg  PO TID for pain and withdrawal symptoms -Continue Vistaril 50 mg PO Q6H PRN for anxiety  -Encourage group therapy participation -Requested Peer Support to counsel patient   Oneta Rack, NP 02/07/2018, 2:14 PM

## 2018-02-07 NOTE — Progress Notes (Signed)
DAR NOTE: Patient presents with anxious affect and mood.  Reports suicidal thoughts but verbally contracts for safety.  No respiratory distress, auditory and visual hallucinations reported or noted.  Described energy level as low and concentration as poor.  Rates depression at 6, hopelessness at 7, and anxiety at 5.  Maintained on routine safety checks.  Medications given as prescribed.  Support and encouragement offered as needed.  Attended group and participated.  States goal for today is "control anger and no cigarette."  Patient visible in milieu for activity and therapy.  Vistaril 50 mg given for complain of anxiety with good effect.

## 2018-02-07 NOTE — [facility] Group Notes (Signed)
LCSW Group Therapy Note   02/07/2018 1:15pm   Type of Therapy and Topic:  Group Therapy:  Overcoming Obstacles   Participation Level:  Active   Description of Group:    In this group patients will be encouraged to explore what they see as obstacles to their own wellness and recovery. They will be guided to discuss their thoughts, feelings, and behaviors related to these obstacles. The group will process together ways to cope with barriers, with attention given to specific choices patients can make. Each patient will be challenged to identify changes they are motivated to make in order to overcome their obstacles. This group will be process-oriented, with patients participating in exploration of their own experiences as well as giving and receiving support and challenge from other group members.   Therapeutic Goals: 1. Patient will identify personal and current obstacles as they relate to admission. 2. Patient will identify barriers that currently interfere with their wellness or overcoming obstacles.  3. Patient will identify feelings, thought process and behaviors related to these barriers. 4. Patient will identify two changes they are willing to make to overcome these obstacles:      Summary of Patient Progress  "Jeffrey [name]" was attentive and engaged during today's processing group. He shared that his biggest obstacle involves managing his anxiety and dealing with past trauma involving witnessing deaths and the death of his mother. Jeffrey [name] continues to show progress in the group setting with improving insight.     Therapeutic Modalities:   Cognitive Behavioral Therapy Solution Focused Therapy Motivational Interviewing Relapse Prevention Therapy  Ledell PeoplesHeather N Smart, LCSW 02/07/2018 12:14 PM

## 2018-02-07 NOTE — Progress Notes (Signed)
Patient ID: Jeffrey [name], male   DOB: [birth year], 57 y.o.   MRN: [id number]  D: Patient pleasant on approach tonight. Reports mood much improved since first admission here. Currently denies any SI and contracts for safety on the unit.Complained of some back pain tonight and tylenol given. No other issues reported tonight. A: Staff will continue to monitor on q 15 minute checks, follow treatment plan, and give medications as ordered. R: Cooperative on the unit.

## 2018-02-08 ENCOUNTER — Ambulatory Visit (HOSPITAL_COMMUNITY): Payer: Self-pay | Admitting: Psychiatry

## 2018-02-08 MED ORDER — METOPROLOL TARTRATE 50 MG PO TABS: 50.0000 mg | ORAL_TABLET | Freq: Two times a day (BID) | ORAL | 0 refills | Status: DC

## 2018-02-08 MED ORDER — FLUOXETINE HCL 40 MG PO CAPS: 40.0000 mg | ORAL_CAPSULE | Freq: Every day | ORAL | 0 refills | Status: DC

## 2018-02-08 MED ORDER — OLANZAPINE 10 MG PO TABS: 10.0000 mg | ORAL_TABLET | Freq: Every day | ORAL | 0 refills | Status: DC

## 2018-02-08 MED ORDER — HYDROXYZINE HCL 50 MG PO TABS: 50.0000 mg | ORAL_TABLET | Freq: Four times a day (QID) | ORAL | 0 refills | Status: DC | PRN

## 2018-02-08 MED ORDER — ATORVASTATIN CALCIUM 20 MG PO TABS: 20.0000 mg | ORAL_TABLET | Freq: Every day | ORAL | 0 refills | Status: AC

## 2018-02-08 MED ORDER — TRAZODONE HCL 50 MG PO TABS: 50.0000 mg | ORAL_TABLET | Freq: Every evening | ORAL | 0 refills | Status: DC | PRN

## 2018-02-08 MED ORDER — GABAPENTIN 400 MG PO CAPS: 400.0000 mg | ORAL_CAPSULE | Freq: Three times a day (TID) | ORAL | 0 refills | Status: DC

## 2018-02-08 MED ORDER — MOMETASONE FURO-FORMOTEROL FUM 200-5 MCG/ACT IN AERO: 2.0000 | INHALATION_SPRAY | Freq: Two times a day (BID) | RESPIRATORY_TRACT | 0 refills | Status: DC

## 2018-02-08 MED ORDER — DIVALPROEX SODIUM ER 500 MG PO TB24: 1500.0000 mg | ORAL_TABLET | Freq: Every day | ORAL | 0 refills | Status: DC

## 2018-02-08 MED ORDER — TRAZODONE HCL 50 MG PO TABS
50.0000 mg | ORAL_TABLET | Freq: Every day | ORAL | Status: DC
  Filled 2018-02-08: qty 7

## 2018-02-08 NOTE — Progress Notes (Signed)
  [facility]  Will you be returning to the same living situation after discharge:  Yes,  home At discharge, do you have transportation home?: Yes,  family member Do you have the ability to pay for your medications: Yes,  [employer] medicare  Release of information consent forms completed and submitted to medical records by CSW.  Patient to Follow up at: Follow-up Information    [facility] Follow up on 02/22/2018.   Specialty:  Behavioral Health Why:  Hospital follow-up/medication management appt on Tuesday, 4/2 at 1:15PM with Dr. Tenny Crawoss. Thank you.  Contact information: [address] [place] [id number] [phone]       Patient declined referral for therapy. Follow up.           Next level of care provider [facility]:yes  Safety Planning and Suicide Prevention discussed: Yes,  SPE completed with pt; contact attempts made with pt's daughter. SPI pamphlet and Mobile crisis information also provided to pt.   Have you used any form of tobacco in the last 30 days? (Cigarettes, Smokeless Tobacco, Cigars, and/or Pipes): Yes  Has patient been referred to the Quitline?: Patient refused referral  Patient has been referred for addiction treatment: Yes  Pulte HomesHeather N Smart, LCSW 02/08/2018, 9:46 AM
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
## 2018-02-08 NOTE — Progress Notes (Signed)
The patient attended last evening's A.A.meeting and was appropriate.  

## 2018-02-08 NOTE — Progress Notes (Signed)
Patient ID: Jeffrey [name], male   DOB: September 29, 1961, 57 y.o.   MRN: [id number] D: Patient denies SI/HI/AVH, took all meds as scheduled, reports his appetite as good, reports his sleep quality last night as good, complained of feeling anxious earlier in the shift, and was medicated with Atarax 50mg  at 0752am, and asked for Atarax again at 140pm and was given another dose.  Discharge orders have been entered for pt and pt has been notified, but has stated that he will only be leaving at 5pm because that is when his daughter gets off work. Provider and social worker notified.  A: Pt being given all of his medications as scheduled, Q15 minute safety checks being maintained.  Pt has been educated about alternative coping mechanisms for his anxiety and has verbalized understanding.  R: Q15 minute safety checks being maintained, will continue to monitor.

## 2018-02-08 NOTE — Progress Notes (Signed)
Pt d/c to home with family.  No distress noted.  Pt verbalized understanding of d/c instructions.  Pt d/c with instructions, medication samples, prescriptions and all belongings.  Pt denied SI, HI and AVH.

## 2018-02-08 NOTE — [facility] Suicide Risk Assessment (Signed)
[facility]   Principal Problem: Bipolar disorder with severe depression [facility](HCC) Discharge Diagnoses:  Patient Active Problem List   Diagnosis Date Noted  . Bipolar disorder with severe depression (HCC) [F31.4] 02/01/2018  . Alcohol use disorder, severe, dependence (HCC) [F10.20] 02/01/2018  . Cannabis use disorder, severe, dependence (HCC) [F12.20] 02/01/2018  . MDD (major depressive disorder), recurrent episode, severe (HCC) [F33.2] 01/31/2018  . Internal and external bleeding hemorrhoids [K64.4, K64.8]   . Rectal bleeding [K62.5]   . Iron deficiency anemia due to chronic blood loss [D50.0] 03/09/2017  . Anemia [D64.9]   . GIB (gastrointestinal bleeding) [K92.2] 02/07/2017  . Acute blood loss anemia [D62] 02/07/2017  . Weakness generalized [R53.1] 02/07/2017  . Dizziness [R42] 02/07/2017  . Peptic ulcer disease [K27.9]   . Bipolar disorder (HCC) [F31.9]   . 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 Dyspnea [R06.00] 08/26/2016  . H/O: depression [Z86.59] 08/26/2016  . Dyslipidemia [E78.5] 08/26/2016  . Tobacco use disorder [F17.200] 08/26/2016  . [name] (peripheral vascular disease) (HCC) [I73.9] 06/18/2015  . Chronic diarrhea [K52.9] 05/02/2013  . Hematochezia [K92.1] 05/02/2013  . Abnormal weight loss [R63.4] 05/02/2013  . Abdominal pain, periumbilical [R10.33] 05/02/2013    Total Time spent with patient: 30 minutes  Musculoskeletal: Strength & Muscle Tone: within normal limits Gait & Station: normal Patient leans: N/A  Psychiatric Specialty Exam: Review of Systems  Constitutional: Negative for chills and fever.  Respiratory: Negative for cough and shortness of breath.   Cardiovascular: Negative for chest pain.  Gastrointestinal: Negative for abdominal pain, heartburn, nausea and vomiting.  Psychiatric/Behavioral: Negative for depression, hallucinations and suicidal ideas. The patient is not nervous/anxious.     Blood pressure 134/75, pulse 68,  temperature 97.8 F (36.6 C), temperature source Oral, resp. rate 16, height 5\' 7"  (1.702 m), weight 60.3 kg (133 lb), SpO2 98 %.Body mass index is 20.83 kg/m.  General Appearance: Casual and Fairly Groomed  Patent attorneyye Contact::  Good  Speech:  Clear and Coherent and Normal Rate  Volume:  Normal  Mood:  Euthymic  Affect:  Congruent  Thought Process:  Coherent and Goal Directed  Orientation:  Full (Time, Place, and Person)  Thought Content:  Logical  Suicidal Thoughts:  No  Homicidal Thoughts:  No  Memory:  Immediate;   Fair Recent;   Fair Remote;   Fair  Judgement:  Fair  Insight:  Fair  Psychomotor Activity:  Normal  Concentration:  Fair  Recall:  FiservFair  Fund of Knowledge:Fair  Language: Fair  Akathisia:  No  Handed:    AIMS (if indicated):     Assets:  Communication Skills Resilience  Sleep:  Number of Hours: 4  Cognition: WNL  ADL's:  Intact   Mental Status Per Nursing Assessment::   On Admission:  NA  Demographic Factors:  Male, Caucasian and Low socioeconomic status  Loss Factors: NA  Historical Factors: Impulsivity  Risk Reduction Factors:   Positive social support, Positive therapeutic relationship and Positive coping skills or problem solving skills  Continued Clinical Symptoms:  Bipolar Disorder:   Depressive phase  Cognitive Features That Contribute To Risk:  None    Suicide Risk:  Minimal: No identifiable suicidal ideation.  Patients presenting with no risk factors but with morbid ruminations; may be classified as minimal risk based on the severity of the depressive symptoms  Follow-up Information    [facility] Follow up on 02/22/2018.   Specialty:  Behavioral Health Why:  Hospital follow-up/medication management  appt on Tuesday, 4/2 at 1:15PM with [name]. Thank you.  Contact information: [address] [place] [address] [phone]       Patient declined referral for  therapy. Follow up.         Subjective Data: Amritpal Shropshire" [name] is a 57 y/o M with history of bipolar disorder, cannabis use, and alcohol use disorder who was admitted from [facility] ED where he had presented after attempting suicide via overdose of temazepam and alcohol. Pt reported that he had consumed an entire bottle of 30 tablets of temazepam *30mg  and drank about 1 pint of hard liquor in an attempt to end his life. He was brought to ED by family, and he was sedated to degree that majority of history was provided by family. Pt was restarted on depakote and prozac, and he was also titrated up on dose of olanzapine. He reported incremental improvement of presenting symptoms and good tolerance of his medication regimen.  Today upon evaluation, pt shares, "I'm doing much better, doc. I'm feeling ready to go home today." Pt denies SI/HI/AH/VH. He is sleeping well and his appetite is good. He notes some sensation of mild tremor in his hands which he notes has been a chronic problem but appears slightly worsened today. He does not feel it is overly bothersome and he is still in agreement to discharge today. He agrees to continue his current regimen without changes. He was able to engage in safety planning including plan to return to [facility] or contact emergency services if he feels unable to maintain his own safety or the safety of others. Pt had no further questions, comments, or concerns.   Plan Of Care/Follow-up recommendations:   -Discharge to outpatient level of care  -Bipolar I current episode depressed - Continue Depakote ER 1500 mg po Q hs. -Continue Prozac 40 mgpo po daily. -Continue Olanzapine 10 mg po Q hs.  -Anxiety - Continue Gabapentin 300 mg po tid.    - Continue atarax 50mg  po q6h prn anxiety  -Smoking cessation. - Continue Nicotine patch 21 mg topically Q 24 hours.  -Other medical issues. - Continue Albuterol inhaler 108 (90 base) Mcg/Act 2  puffs Q 4 hours prn for SOB. - Continue ipratropium Soln 0.5 mg tid for SOB. - Continue ASA 81 mg po daily for heart health. - Continue Lipitor 20 mg po Q evenings for high cholesterol. - Continue Metoprolol tartrate 50 mg po bid for HTN. - Continue Lisinopril 5 mg po daily x 3 days for elevated blood pressure. - Continue Dulera 200-5 MCG/Act 2 puffs bid for SOB.  Activity:  as tolerated Diet:  normal Tests:  NA Other:  see above for DC plan  Micheal Likens, MD 02/08/2018, 9:30 AM

## 2018-02-22 ENCOUNTER — Ambulatory Visit (INDEPENDENT_AMBULATORY_CARE_PROVIDER_SITE_OTHER): Payer: Medicare Other | Admitting: Psychiatry

## 2018-02-22 ENCOUNTER — Encounter (HOSPITAL_COMMUNITY): Payer: Self-pay | Admitting: Psychiatry

## 2018-02-22 VITALS — BP 152/83 | HR 53 | Ht 67.0 in | Wt 144.0 lb

## 2018-02-22 DIAGNOSIS — F3162 Bipolar disorder, current episode mixed, moderate: Secondary | ICD-10-CM | POA: Diagnosis not present

## 2018-02-22 DIAGNOSIS — F17210 Nicotine dependence, cigarettes, uncomplicated: Secondary | ICD-10-CM

## 2018-02-22 DIAGNOSIS — Z630 Problems in relationship with spouse or partner: Secondary | ICD-10-CM

## 2018-02-22 DIAGNOSIS — Z736 Limitation of activities due to disability: Secondary | ICD-10-CM | POA: Diagnosis not present

## 2018-02-22 DIAGNOSIS — Z818 Family history of other mental and behavioral disorders: Secondary | ICD-10-CM | POA: Diagnosis not present

## 2018-02-22 MED ORDER — HYDROXYZINE HCL 50 MG PO TABS: 50.0000 mg | ORAL_TABLET | Freq: Four times a day (QID) | ORAL | 2 refills | Status: DC | PRN

## 2018-02-22 MED ORDER — TRAZODONE HCL 50 MG PO TABS: 50.0000 mg | ORAL_TABLET | Freq: Every evening | ORAL | 2 refills | Status: DC | PRN

## 2018-02-22 MED ORDER — DIVALPROEX SODIUM ER 500 MG PO TB24: 1500.0000 mg | ORAL_TABLET | Freq: Every day | ORAL | 2 refills | Status: DC

## 2018-02-22 MED ORDER — GABAPENTIN 400 MG PO CAPS: 400.0000 mg | ORAL_CAPSULE | Freq: Three times a day (TID) | ORAL | 2 refills | Status: DC

## 2018-02-22 MED ORDER — OLANZAPINE 10 MG PO TABS: 10.0000 mg | ORAL_TABLET | Freq: Every day | ORAL | 2 refills | Status: DC

## 2018-02-22 MED ORDER — FLUOXETINE HCL 40 MG PO CAPS: 40.0000 mg | ORAL_CAPSULE | Freq: Every day | ORAL | 2 refills | Status: DC

## 2018-02-22 NOTE — Progress Notes (Signed)
BH MD/PA/NP OP Progress Note  02/22/2018 2:02 PM Jeffrey [name]  MRN:  [id number]  Chief Complaint:  Chief Complaint    Depression; Anxiety; Follow-up     HPI: This patient is a 57 year old separated white male who is livingalone on his father's land near [place]. 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  The patient had been going to [facility] for the last couple of years but they no longer take his insurance and he was therefore referred here. His daughter states that he is not doing well in terms of mood and needs to be seen anyway.  The patient has a long history of depression that dates back to his early 60s. At that time he was married to his first wife and for a while it went well. However when they separated she did not allow him to see his son and this went on for a period of about 8 years. He became increasingly stressed and depressed during this time. He was hospitalized several times for suicide attempts and was in the [facility] twice in 2009. His daughter thinks his last hospital they shows approximately 5 years ago.  The patient married again and he has been married to his second wife for 17 years. She has 3 adult children and one of her sons is very violent. He is finally decided to leave his wife because her son has fought with him several times and he feels like they're trying to take away his land. Over the last month he has been staying with his daughter but is very stressed about the whole situation. He's not able to eat and has lost 40 pounds over last year. His mood is very low and sad. His memory is poor. He smokes marijuana intermittently to try to deal with the stress. He has no interest in anything other than his children and grandchildren. His energy is nonexistent and he cannot get himself out of bed. He sees [name] for primary care and was there recently and apparently there was nothing medically wrong but we do not have  these records. He has had a recent colonoscopy that was negative. He denies auditory or visual hallucinations and does not use alcohol or drugs other than marijuana  He has been on a combination of Depakote Paxil Navane and BuSpar for number of years. Nothing has been change in the last couple of years despite his continued downward slide.   The patient returns after about 6 weeks.  [facility] and stayed until 02/08/2018 after he made a suicide attempt by drinking alcohol and combining it with Restoril.  He admits that he had been going up to a neighbor's house for the last few weeks and drinking "corn liquor".  His urine drug screen at the hospital was also positive for cocaine marijuana and benzodiazepines which were prescribed.  He states that he does not usually use cocaine but he continues to smoke marijuana almost daily.  He states that everything is gotten too much for him.  His family members were fighting.  He still loves his ex-wife and they want to see each other but their children hate each other.  He had gone to pick her up prior to the admission and her son called and told him never to come back to her property.  He felt very hopeless.  He states that he plan to drink a lot that day and  then take the Restoril to attempt to kill himself.  Apparently he told the ex-wife what he had done and she had him brought to the hospital.  While there is benzodiazepine and Restoril were discontinued, Prozac was increased Depakote was continued and gabapentin and hydroxyzine were added.  He seems somewhat better now and states he is no longer drinking at all or using any drugs other than occasional marijuana.  He still feels discouraged about his whole situation regarding his ex-wife.  He is not been in counseling for several years and never would come more than once.  He states that he is haunted by images and nightmares about people who have died such as his  brother and mother as well as several people of had drowning accidents on his property.  He seems to be suffering from PTSD.  He claims that he is not suicidal now and is going to try to make the best of things.  He agrees to try the counseling Visit Diagnosis:    ICD-10-CM   1. Moderate mixed bipolar I disorder (HCC) F31.62     Past Psychiatric History: Several previous hospitalizations for depression, the last one being on 01/31/2018 for suicide attempt  Past Medical History:  Past Medical History:  Diagnosis Date  . Acute blood loss anemia 02/07/2017  . Anxiety   . Arthritis    deg disease, bulging disk,  shoulder level  . Bipolar disorder (HCC)   . Bipolar disorder (HCC)   . 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 Depression    anxiety  . Hyperlipidemia   . Hypertension   . Peptic ulcer disease    Review  . Pneumonia   . [name] (peripheral vascular disease) (HCC) 06/18/2015  . Shortness of breath     Past Surgical History:  Procedure Laterality Date  . BIOPSY  02/09/2017   Procedure: BIOPSY;  Surgeon: [name], MD;  [facility];  Service: Endoscopy;;  duodenal gastric  . COLONOSCOPY  03/14/2007   [id number] colonoscopy and terminal ileoscopy except external hemorrhoids  . COLONOSCOPY N/A 05/05/2013   [name]: external/internal anal canal hemorrhoids, unable to intubate TI, segemental biopsies unremarkable   . COLONOSCOPY N/A 04/11/2017   Procedure: COLONOSCOPY;  Surgeon: [name], MD;  [facility];  Service: Endoscopy;  Laterality: N/A;  . COLONOSCOPY WITH PROPOFOL N/A 03/31/2017   Procedure: COLONOSCOPY WITH PROPOFOL;  Surgeon: [name], MD;  [facility];  Service: Endoscopy;  Laterality: N/A;  1:45pm  . ESOPHAGOGASTRODUODENOSCOPY  03/14/2007   [id number] antral gastritis with bulbar duodenitis/paucity to postbulbar duodenal folds and biopsy were benign with no evidence of villous atrophy.  . ESOPHAGOGASTRODUODENOSCOPY (EGD)  WITH PROPOFOL N/A 02/09/2017   Procedure: ESOPHAGOGASTRODUODENOSCOPY (EGD) WITH PROPOFOL;  Surgeon: [name], MD;  [facility];  Service: Endoscopy;  Laterality: N/A;  . GIVENS CAPSULE STUDY N/A 04/08/2017   Procedure: GIVENS CAPSULE STUDY;  Surgeon: [name], MD;  [facility];  Service: Endoscopy;  Laterality: N/A;  . HAND SURGERY     left, secondary to self-inflicted laceration  . HEMORRHOID SURGERY N/A 04/14/2017   Procedure: HEMORRHOIDECTOMY;  Surgeon: [name], MD;  Location: [facility];  Service: General;  Laterality: N/A;  . SHOULDER SURGERY     right  . TOE SURGERY     left great toe , amputated-lawnmover accident    Family Psychiatric History: See below  Family History:  Family History  Problem Relation  Age of Onset  . Breast cancer Mother        deceased  . Heart disease Father   . Depression Daughter   . Anxiety disorder Daughter   . Anxiety disorder Son   . Depression Son   . Asthma Brother   . Heart attack Maternal Aunt   . Heart attack Maternal Uncle   . Heart attack Paternal Aunt   . Heart attack Paternal Uncle   . Heart attack Maternal Grandmother   . Heart attack Maternal Grandfather   . Emphysema Maternal Grandfather   . Heart attack Paternal Grandmother   . Heart attack Paternal Grandfather   . Colon cancer Neg Hx   . Liver disease Neg Hx     Social History:  Social History   Socioeconomic History  . Marital status: Divorced    Spouse name: Not on file  . Number of children: 2  . Years of education: Not on file  . Highest education level: Not on file  Occupational History  . Occupation: Disabled  Social Needs  . Financial resource strain: Not on file  . Food insecurity:    Worry: Not on file    Inability: Not on file  . Transportation needs:    Medical: Not on file    Non-medical: Not on file  Tobacco Use  . Smoking status: Current Every Day Smoker    Packs/day: 1.00    Years: 40.00    Pack years:  40.00    Types: Cigarettes    Start date: 08/04/1971  . Smokeless tobacco: Never Used  . Tobacco comment: peak rate of 2.5ppd, 1/2ppd on 11/27/2016 -- 6 cigarettes / day 01/25/18  Substance and Sexual Activity  . Alcohol use: No    Alcohol/week: 0.0 oz  . Drug use: Yes    Types: Marijuana    Comment: most days   . Sexual activity: Never  Lifestyle  . Physical activity:    Days per week: Not on file    Minutes per session: Not on file  . Stress: Not on file  Relationships  . Social connections:    Talks on phone: Not on file    Gets together: Not on file    Attends religious service: Not on file    Active member of club or organization: Not on file    Attends meetings of clubs or organizations: Not on file    Relationship status: Not on file  Other Topics Concern  . Not on file  Social History Narrative   Originally from [place]. Previously has lived in [facility] 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 Questionable asbestos exposure. Does have significant exposure to fumes. No mold exposure. No bird exposure. No pets currently.     Allergies:  Allergies  Allergen Reactions  . Augmentin [Amoxicillin-Pot Clavulanate] Rash and Other (See Comments)    Has patient had a PCN reaction causing immediate rash, facial/tongue/throat swelling, SOB or lightheadedness with hypotension: No Has patient had a PCN reaction causing severe rash involving mucus membranes or skin necrosis: No Has patient had a PCN reaction that required hospitalization No Has patient had a PCN reaction occurring within the last 10 years: Yes If all of the above answers are "NO", then may proceed with Cephalosporin use.  . Ace Inhibitors Hives    Metabolic Disorder Labs: No results found for: HGBA1C, MPG No results found for: PROLACTIN No results found for:  CHOL, TRIG, HDL, CHOLHDL, VLDL, LDLCALC   Therapeutic Level Labs: No results found for:  LITHIUM Lab Results  Component Value Date   VALPROATE 14 (L) 01/30/2018   VALPROATE 68.1 05/24/2017   No components found for:  CBMZ  Current Medications: Current Outpatient Medications  Medication Sig Dispense Refill  . albuterol (PROAIR HFA) 108 (90 Base) MCG/ACT inhaler Inhale 2 puffs into the lungs every 4 (four) hours as needed for wheezing or shortness of breath. 1 Inhaler 3  . albuterol (PROVENTIL) (2.5 MG/3ML) 0.083% nebulizer solution Take 3 mLs (2.5 mg total) by nebulization every 4 (four) hours as needed for shortness of breath. 120 mL 3  . aspirin EC 81 MG tablet Take 81 mg by mouth every evening.     Marland Kitchen. atorvastatin (LIPITOR) 20 MG tablet Take 1 tablet (20 mg total) by mouth daily. For high cholesterol 30 tablet 0  . divalproex (DEPAKOTE ER) 500 MG 24 hr tablet Take 3 tablets (1,500 mg total) by mouth at bedtime. 90 tablet 2  . FLUoxetine (PROZAC) 40 MG capsule Take 1 capsule (40 mg total) by mouth daily. For mood control 30 capsule 2  . gabapentin (NEURONTIN) 400 MG capsule Take 1 capsule (400 mg total) by mouth 3 (three) times daily. 90 capsule 2  . hydrOXYzine (ATARAX/VISTARIL) 50 MG tablet Take 1 tablet (50 mg total) by mouth every 6 (six) hours as needed for anxiety (sleep). 30 tablet 2  . ipratropium (ATROVENT) 0.02 % nebulizer solution Take 2.5 mLs (0.5 mg total) by nebulization 3 (three) times daily. 225 mL 11  . metoprolol tartrate (LOPRESSOR) 50 MG tablet Take 1 tablet (50 mg total) by mouth 2 (two) times daily. 60 tablet 0  . mometasone-formoterol (DULERA) 200-5 MCG/ACT AERO Inhale 2 puffs into the lungs 2 (two) times daily. For asthma 1 Inhaler 0  . OLANZapine (ZYPREXA) 10 MG tablet Take 1 tablet (10 mg total) by mouth at bedtime. For mood stability 30 tablet 2  . traZODone (DESYREL) 50 MG tablet Take 1 tablet (50 mg total) by mouth at bedtime as needed for sleep. 30 tablet 2   No current facility-administered medications for this visit.       Musculoskeletal: Strength & Muscle Tone: within normal limits Gait & Station: normal Patient leans: N/A  Psychiatric Specialty Exam: Review of Systems  Psychiatric/Behavioral: Positive for depression.  All other systems reviewed and are negative.   Blood pressure (!) 152/83, pulse (!) 53, height 5\' 7"  (1.702 m), weight 144 lb (65.3 kg), SpO2 98 %.Body mass index is 22.55 kg/m.  General Appearance: Casual and Fairly Groomed  Eye Contact:  Fair  Speech:  Clear and Coherent  Volume:  Decreased  Mood:  Dysphoric  Affect:  Constricted  Thought Process:  Goal Directed  Orientation:  Full (Time, Place, and Person)  Thought Content: Rumination   Suicidal Thoughts:  No  Homicidal Thoughts:  No  Memory:  Immediate;   Good Recent;   Good Remote;   Good  Judgement:  Poor  Insight:  Lacking  Psychomotor Activity:  Decreased  Concentration:  Concentration: Fair and Attention Span: Fair  Recall:  Good  Fund of Knowledge: Good  Language: Good  Akathisia:  No  Handed:  Right  AIMS (if indicated): not done  Assets:  Communication Skills Desire for Improvement Resilience Social Support Talents/Skills  ADL's:  Intact  Cognition: WNL  Sleep:  Fair   Screenings: AIMS     [facility]  AIMS Total Score  0    AUDIT     Ad[facility]  Alcohol Use Disorder Identification Test Final Score (AUDIT)  18       Assessment and Plan: This patient is a 57 year old male with a history of bipolar disorder and recent substance abuse, primarily alcohol.  He seems to go through cycles of being despondent and focusing on bad memories.  He probably at this time qualifies for diagnosis of posttraumatic stress disorder as well as bipolar.  We will get him into counseling immediately.  For now he will continue Depakote ER 1500 mg daily at bedtime,  Prozac 40 mg daily for depression, gabapentin 400 mg 3 times daily for anxiety, hydroxyzine 50 mg every 6 hours as needed for anxiety, olanzapine 10 mg at bedtime for mood stabilization and trazodone 50 mg at bedtime as needed for sleep.  He is at risk for recurrent suicidal ideation and will be monitored more closely from now on.  We will get him into counseling and I would see him again in 4 weeks.   Diannia Ruder, MD 02/22/2018, 2:02 PM

## 2018-03-24 ENCOUNTER — Ambulatory Visit (HOSPITAL_COMMUNITY): Payer: Medicare Other | Admitting: Psychiatry

## 2018-03-29 ENCOUNTER — Ambulatory Visit (HOSPITAL_COMMUNITY): Payer: Self-pay | Admitting: Licensed Clinical Social Worker

## 2018-03-31 ENCOUNTER — Ambulatory Visit (HOSPITAL_COMMUNITY): Payer: Medicare Other | Admitting: Psychiatry

## 2018-05-06 ENCOUNTER — Encounter (HOSPITAL_COMMUNITY): Payer: Self-pay | Admitting: Psychiatry

## 2018-05-06 ENCOUNTER — Ambulatory Visit (INDEPENDENT_AMBULATORY_CARE_PROVIDER_SITE_OTHER): Payer: Medicare Other | Admitting: Psychiatry

## 2018-05-06 VITALS — BP 99/63 | HR 69 | Ht 67.0 in | Wt 143.0 lb

## 2018-05-06 DIAGNOSIS — F3162 Bipolar disorder, current episode mixed, moderate: Secondary | ICD-10-CM | POA: Diagnosis not present

## 2018-05-06 DIAGNOSIS — F17210 Nicotine dependence, cigarettes, uncomplicated: Secondary | ICD-10-CM | POA: Diagnosis not present

## 2018-05-06 DIAGNOSIS — Z915 Personal history of self-harm: Secondary | ICD-10-CM

## 2018-05-06 DIAGNOSIS — Z6822 Body mass index (BMI) 22.0-22.9, adult: Secondary | ICD-10-CM | POA: Diagnosis not present

## 2018-05-06 DIAGNOSIS — R0602 Shortness of breath: Secondary | ICD-10-CM | POA: Diagnosis not present

## 2018-05-06 DIAGNOSIS — F1911 Other psychoactive substance abuse, in remission: Secondary | ICD-10-CM | POA: Diagnosis not present

## 2018-05-06 DIAGNOSIS — R634 Abnormal weight loss: Secondary | ICD-10-CM | POA: Diagnosis not present

## 2018-05-06 DIAGNOSIS — Z91410 Personal history of adult physical and sexual abuse: Secondary | ICD-10-CM

## 2018-05-06 DIAGNOSIS — F419 Anxiety disorder, unspecified: Secondary | ICD-10-CM | POA: Diagnosis not present

## 2018-05-06 DIAGNOSIS — Z818 Family history of other mental and behavioral disorders: Secondary | ICD-10-CM

## 2018-05-06 MED ORDER — TRAZODONE HCL 50 MG PO TABS: 50.0000 mg | ORAL_TABLET | Freq: Every evening | ORAL | 2 refills | Status: DC | PRN

## 2018-05-06 MED ORDER — HYDROXYZINE HCL 50 MG PO TABS: 50.0000 mg | ORAL_TABLET | Freq: Four times a day (QID) | ORAL | 2 refills | Status: DC | PRN

## 2018-05-06 MED ORDER — FLUOXETINE HCL 40 MG PO CAPS: 40.0000 mg | ORAL_CAPSULE | Freq: Every day | ORAL | 2 refills | Status: DC

## 2018-05-06 MED ORDER — OLANZAPINE 10 MG PO TABS: 10.0000 mg | ORAL_TABLET | Freq: Every day | ORAL | 2 refills | Status: DC

## 2018-05-06 MED ORDER — DIVALPROEX SODIUM ER 500 MG PO TB24: 1500.0000 mg | ORAL_TABLET | Freq: Every day | ORAL | 2 refills | Status: DC

## 2018-05-06 NOTE — Progress Notes (Signed)
BH MD/PA/NP OP Progress Note  05/06/2018 11:41 AM Jeffrey [name]  MRN:  [id number]  Chief Complaint:  Chief Complaint    Depression; Anxiety; Follow-up; Manic Behavior     HPI: This patient is a 57 year old separated white male who is livingalone on his father's land near [place]. 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  The patient had been going to [facility] for the last couple of years but they no longer take his insurance and he was therefore referred here. His daughter states that he is not doing well in terms of mood and needs to be seen anyway.  The patient has a long history of depression that dates back to his early 58s. At that time he was married to his first wife and for a while it went well. However when they separated she did not allow him to see his son and this went on for a period of about 8 years. He became increasingly stressed and depressed during this time. He was hospitalized several times for suicide attempts and was in the [facility] twice in 2009. His daughter thinks his last hospital they shows approximately 5 years ago.  The patient married again and he has been married to his second wife for 17 years. She has 3 adult children and one of her sons is very violent. He is finally decided to leave his wife because her son has fought with him several times and he feels like they're trying to take away his land. Over the last month he has been staying with his daughter but is very stressed about the whole situation. He's not able to eat and has lost 40 pounds over last year. His mood is very low and sad. His memory is poor. He smokes marijuana intermittently to try to deal with the stress. He has no interest in anything other than his children and grandchildren. His energy is nonexistent and he cannot get himself out of bed. He sees [name] for primary care and was there recently and apparently there was nothing medically wrong but  we do not have these records. He has had a recent colonoscopy that was negative. He denies auditory or visual hallucinations and does not use alcohol or drugs other than marijuana  He has been on a combination of Depakote Paxil Navane and BuSpar for number of years. Nothing has been change in the last couple of years despite his continued downward slide  The patient returns after  2 months.  He was supposed to be seen 4 weeks after his last visit on 02/22/2018.  Prior to that he had been in the psychiatric hospital after suicide attempt.  He claims that we never called him about appointments for either me or the therapist.  He also claims he ran out of clonazepam but I explained that this was stopped at the hospital after his drug screen was positive for marijuana and cocaine.  He is supposed to be taking hydroxyzine for anxiety but he never picked it up.  I explained that I would reorder this today but I would not reorder the clonazepam and he is okay with this.  He states the last few days been better for him.  His ex-wife had surgery for colorectal cancer and he has been taking care of her.  He wants to be around her even though her children do not really want him there.  He denies being suicidal and denies any use of drugs  or alcohol right now.  He is "fairly good" about taking his medicine but admits sometimes he misses dosages.  I explained again that these will not work if he does not take them as prescribed. Visit Diagnosis:    ICD-10-CM   1. Moderate mixed bipolar I disorder (HCC) F31.62 Valproic Acid level    Past Psychiatric History: Several previous hospitalizations for depression, the last one being on 01/31/2018 for suicide attempt  Past Medical History:  Past Medical History:  Diagnosis Date  . Acute blood loss anemia 02/07/2017  . Anxiety   . Arthritis    deg disease, bulging disk,  shoulder level  . Bipolar disorder (HCC)   . Bipolar disorder (HCC)   . 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 Depression    anxiety  . Hyperlipidemia   . Hypertension   . Peptic ulcer disease    Review  . Pneumonia   . [name] (peripheral vascular disease) (HCC) 06/18/2015  . Shortness of breath     Past Surgical History:  Procedure Laterality Date  . BIOPSY  02/09/2017   Procedure: BIOPSY;  Surgeon: [name], MD;  [facility];  Service: Endoscopy;;  duodenal gastric  . COLONOSCOPY  03/14/2007   [id number] colonoscopy and terminal ileoscopy except external hemorrhoids  . COLONOSCOPY N/A 05/05/2013   [name]: external/internal anal canal hemorrhoids, unable to intubate TI, segemental biopsies unremarkable   . COLONOSCOPY N/A 04/11/2017   Procedure: COLONOSCOPY;  Surgeon: [name], MD;  [facility]: [facility];  Service: Endoscopy;  Laterality: N/A;  . COLONOSCOPY WITH PROPOFOL N/A 03/31/2017   Procedure: COLONOSCOPY WITH PROPOFOL;  Surgeon: [name], MD;  [facility];  Service: Endoscopy;  Laterality: N/A;  1:45pm  . ESOPHAGOGASTRODUODENOSCOPY  03/14/2007   [id number] antral gastritis with bulbar duodenitis/paucity to postbulbar duodenal folds and biopsy were benign with no evidence of villous atrophy.  . ESOPHAGOGASTRODUODENOSCOPY (EGD) WITH PROPOFOL N/A 02/09/2017   Procedure: ESOPHAGOGASTRODUODENOSCOPY (EGD) WITH PROPOFOL;  Surgeon: [name], MD;  [facility];  Service: Endoscopy;  Laterality: N/A;  . GIVENS CAPSULE STUDY N/A 04/08/2017   Procedure: GIVENS CAPSULE STUDY;  Surgeon: [name], MD;  [facility];  Service: Endoscopy;  Laterality: N/A;  . HAND SURGERY     left, secondary to self-inflicted laceration  . HEMORRHOID SURGERY N/A 04/14/2017   Procedure: HEMORRHOIDECTOMY;  Surgeon: [name], MD;  Location: [facility];  Service: General;  Laterality: N/A;  . SHOULDER SURGERY     right  . TOE SURGERY     left great toe , amputated-lawnmover accident    Family Psychiatric History: See below  Family  History:  Family History  Problem Relation Age of Onset  . Breast cancer Mother        deceased  . Heart disease Father   . Depression Daughter   . Anxiety disorder Daughter   . Anxiety disorder Son   . Depression Son   . Asthma Brother   . Heart attack Maternal Aunt   . Heart attack Maternal Uncle   . Heart attack Paternal Aunt   . Heart attack Paternal Uncle   . Heart attack Maternal Grandmother   . Heart attack Maternal Grandfather   . Emphysema Maternal Grandfather   . Heart attack Paternal Grandmother   . Heart attack Paternal Grandfather   . Colon cancer Neg Hx   . Liver disease Neg Hx     Social History:  Social History   Socioeconomic History  . Marital status: Divorced    Spouse name: Not on file  . Number of children: 2  . Years of education: Not on file  . Highest education level: Not on file  Occupational History  . Occupation: Disabled  Social Needs  . Financial resource strain: Not on file  . Food insecurity:    Worry: Not on file    Inability: Not on file  . Transportation needs:    Medical: Not on file    Non-medical: Not on file  Tobacco Use  . Smoking status: Current Every Day Smoker    Packs/day: 1.00    Years: 40.00    Pack years: 40.00    Types: Cigarettes    Start date: 08/04/1971  . Smokeless tobacco: Never Used  . Tobacco comment: peak rate of 2.5ppd, 1/2ppd on 11/27/2016 -- 6 cigarettes / day 01/25/18  Substance and Sexual Activity  . Alcohol use: No    Alcohol/week: 0.0 oz  . Drug use: Yes    Types: Marijuana    Comment: most days   . Sexual activity: Never  Lifestyle  . Physical activity:    Days per week: Not on file    Minutes per session: Not on file  . Stress: Not on file  Relationships  . Social connections:    Talks on phone: Not on file    Gets together: Not on file    Attends religious service: Not on file    Active member of club or organization: Not on file    Attends meetings of clubs or organizations: Not on file     Relationship status: Not on file  Other Topics Concern  . Not on file  Social History Narrative   Originally from [place]. Previously has lived in [place] [place] [facility] 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 Questionable asbestos exposure. Does have significant exposure to fumes. No mold exposure. No bird exposure. No pets currently.     Allergies:  Allergies  Allergen Reactions  . Augmentin [Amoxicillin-Pot Clavulanate] Rash and Other (See Comments)    Has patient had a PCN reaction causing immediate rash, facial/tongue/throat swelling, SOB or lightheadedness with hypotension: No Has patient had a PCN reaction causing severe rash involving mucus membranes or skin necrosis: No Has patient had a PCN reaction that required hospitalization No Has patient had a PCN reaction occurring within the last 10 years: Yes If all of the above answers are "NO", then may proceed with Cephalosporin use.  . Ace Inhibitors Hives    Metabolic Disorder Labs: No results found for: HGBA1C, MPG No results found for: PROLACTIN No results found for: CHOL, TRIG, HDL, CHOLHDL, VLDL, LDLCALC   Therapeutic Level Labs: No results found for: LITHIUM Lab Results  Component Value Date   VALPROATE 14 (L) 01/30/2018   VALPROATE 68.1 05/24/2017   No components found for:  CBMZ  Current Medications: Current Outpatient Medications  Medication Sig Dispense Refill  . albuterol (PROAIR HFA) 108 (90 Base) MCG/ACT inhaler Inhale 2 puffs into the lungs every 4 (four) hours as needed for wheezing or shortness of breath. 1 Inhaler 3  . albuterol (PROVENTIL) (2.5 MG/3ML) 0.083% nebulizer solution Take 3 mLs (2.5 mg total) by nebulization every 4 (four) hours as needed for shortness of breath. 120 mL 3  . aspirin EC 81 MG tablet Take 81 mg by mouth every evening.     Marland Kitchen  atorvastatin (LIPITOR) 20 MG tablet Take 1 tablet (20 mg total) by mouth daily. For high cholesterol  30 tablet 0  . divalproex (DEPAKOTE ER) 500 MG 24 hr tablet Take 3 tablets (1,500 mg total) by mouth at bedtime. 90 tablet 2  . FLUoxetine (PROZAC) 40 MG capsule Take 1 capsule (40 mg total) by mouth daily. For mood control 30 capsule 2  . gabapentin (NEURONTIN) 400 MG capsule Take 1 capsule (400 mg total) by mouth 3 (three) times daily. 90 capsule 2  . hydrOXYzine (ATARAX/VISTARIL) 50 MG tablet Take 1 tablet (50 mg total) by mouth every 6 (six) hours as needed for anxiety (sleep). 30 tablet 2  . ipratropium (ATROVENT) 0.02 % nebulizer solution Take 2.5 mLs (0.5 mg total) by nebulization 3 (three) times daily. 225 mL 11  . metoprolol tartrate (LOPRESSOR) 50 MG tablet Take 1 tablet (50 mg total) by mouth 2 (two) times daily. 60 tablet 0  . mometasone-formoterol (DULERA) 200-5 MCG/ACT AERO Inhale 2 puffs into the lungs 2 (two) times daily. For asthma 1 Inhaler 0  . OLANZapine (ZYPREXA) 10 MG tablet Take 1 tablet (10 mg total) by mouth at bedtime. For mood stability 30 tablet 2  . traZODone (DESYREL) 50 MG tablet Take 1 tablet (50 mg total) by mouth at bedtime as needed for sleep. 30 tablet 2   No current facility-administered medications for this visit.      Musculoskeletal: Strength & Muscle Tone: within normal limits Gait & Station: normal Patient leans: N/A  Psychiatric Specialty Exam: Review of Systems  Respiratory: Positive for shortness of breath.   All other systems reviewed and are negative.   Blood pressure 99/63, pulse 69, height 5\' 7"  (1.702 m), weight 143 lb (64.9 kg), SpO2 95 %.Body mass index is 22.4 kg/m.  General Appearance: Casual and Fairly Groomed  Eye Contact:  Good  Speech:  Clear and Coherent  Volume:  Normal  Mood:  Anxious  Affect:  Congruent  Thought Process:  Goal Directed  Orientation:  Full (Time, Place, and Person)  Thought Content: Rumination   Suicidal Thoughts:  No  Homicidal Thoughts:  No  Memory:  Immediate;   Good Recent;   Fair Remote;   Fair   Judgement:  Poor  Insight:  Lacking  Psychomotor Activity:  Decreased  Concentration:  Concentration: Fair and Attention Span: Fair  Recall:  Good  Fund of Knowledge: Fair  Language: Good  Akathisia:  No  Handed:  Right  AIMS (if indicated): not done  Assets:  Communication Skills Desire for Improvement Resilience Social Support Talents/Skills  ADL's:  Intact  Cognition: WNL  Sleep:  Good   Screenings: AIMS     [facility]  AIMS Total Score  0    AUDIT     [facility]  Alcohol Use Disorder Identification Test Final Score (AUDIT)  18       Assessment and Plan: This patient is a 57 year old male with a history of bipolar disorder.  At times he has been found to abuse substances but he claims he is done with this now.  He is only been marginally compliant with treatment.  I do not think it is prudent for him to continue on any drugs with addiction potential such as benzodiazepines.  Therefore he will start hydroxyzine 50 mg every 6 hours as needed for anxiety continue Depakote ER 1500 mg at bedtime  for mood stabilization, Prozac 40 mg daily for depression olanzapine 10 mg at bedtime for mood stabilization and trazodone 50 mill grams at bedtime as needed for sleep.  He will return to see me in 1 month and claims that he will start the counseling here that we suggested.  He will also check a Depakote level in 2 weeks   Diannia Rudereborah Kiet Geer, MD 05/06/2018, 11:41 AM

## 2018-05-25 ENCOUNTER — Ambulatory Visit (HOSPITAL_COMMUNITY): Payer: Medicare Other | Admitting: Licensed Clinical Social Worker

## 2018-05-25 ENCOUNTER — Telehealth: Payer: Self-pay | Admitting: Adult Health

## 2018-05-25 ENCOUNTER — Encounter (HOSPITAL_COMMUNITY): Payer: Self-pay | Admitting: Licensed Clinical Social Worker

## 2018-05-25 DIAGNOSIS — F3162 Bipolar disorder, current episode mixed, moderate: Secondary | ICD-10-CM

## 2018-05-25 MED ORDER — BUDESONIDE-FORMOTEROL FUMARATE 160-4.5 MCG/ACT IN AERO: 2.0000 | INHALATION_SPRAY | Freq: Two times a day (BID) | RESPIRATORY_TRACT | 3 refills | Status: DC

## 2018-05-25 NOTE — Progress Notes (Signed)
Comprehensive Clinical Assessment (CCA) Note  05/25/2018 Jeffrey [name] [id number]  Visit Diagnosis:      ICD-10-CM   1. Moderate mixed bipolar I disorder (HCC) F31.62       CCA Part One  Part One has been completed on paper by the patient.  (See scanned document in Chart Review)  CCA Part Two A  Intake/Chief Complaint:  CCA Intake With Chief Complaint CCA Part Two Date: 05/25/18 CCA Part Two Time: 0815 Chief Complaint/Presenting Problem: Bipolar and depression Patients Currently Reported Symptoms/Problems: Mood: isoaltes, low energy, difficulty with focus, eating less, lost weight (18-20 lbs), difficulty staying asleep without medications, feelings of hopelessness, feelings of worthlessness,  chronic  health issues,   Anxiety: worries about his health and wife's health, worries about children  Collateral Involvement: None Individual's Strengths: Great tobacco farmer, 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  Individual's Preferences: Prefers to work but can't, doesn't prefer COPD,  Individual's Abilities: Good father, Good tobacco farmer  Type of Services Patient Feels Are Needed: Therapy, medication Initial Clinical Notes/Concerns: Symptoms started around 17 when his mother passed away, symptoms occur daily, symptoms are moderate to severe   Mental Health Symptoms Depression:  Depression: Change in energy/activity, Difficulty Concentrating, Increase/decrease in appetite, Hopelessness, Weight gain/loss, Worthlessness, Sleep (too much or little)  Mania:  Mania: N/A  Anxiety:   Anxiety: Worrying, Tension, Difficulty concentrating, Sleep  Psychosis:  Psychosis: N/A  Trauma:  Trauma: N/A  Obsessions:  Obsessions: N/A  Compulsions:  Compulsions: N/A  Inattention:  Inattention: N/A  Hyperactivity/Impulsivity:  Hyperactivity/Impulsivity: N/A  Oppositional/Defiant Behaviors:  Oppositional/Defiant Behaviors: N/A  Borderline Personality:  Emotional Irregularity: N/A  Other Mood/Personality Symptoms:  Other  Mood/Personality Symtpoms: N/A    Mental Status Exam Appearance and self-care  Stature:  Stature: Average  Weight:  Weight: Thin  Clothing:  Clothing: Casual  Grooming:  Grooming: Normal  Cosmetic use:  Cosmetic Use: None  Posture/gait:  Posture/Gait: Normal  Motor activity:  Motor Activity: Not Remarkable  Sensorium  Attention:  Attention: Normal  Concentration:  Concentration: Normal  Orientation:  Orientation: X5  Recall/memory:     Affect and Mood  Affect:  Affect: Depressed  Mood:  Mood: Depressed  Relating  Eye contact:  Eye Contact: Fleeting  Facial expression:  Facial Expression: Depressed  Attitude toward examiner:  Attitude Toward Examiner: Cooperative  Thought and Language  Speech flow: Speech Flow: Normal  Thought content:  Thought Content: Appropriate to mood and circumstances  Preoccupation:  Preoccupations: (None)  Hallucinations:  Hallucinations: (None)  Organization:   Logical   Company secretaryxecutive Functions  Fund of Knowledge:  Fund of Knowledge: Average  Intelligence:  Intelligence: Average  Abstraction:  Abstraction: Normal  Judgement:  Judgement: Normal  Reality Testing:  Reality Testing: Adequate  Insight:  Insight: Fair  Decision Making:  Decision Making: Normal  Social Functioning  Social Maturity:  Social Maturity: Isolates  Social Judgement:  Social Judgement: Normal  Stress  Stressors:  Stressors: Illness, Transitions  Coping Ability:  Coping Ability: Building surveyorverwhelmed  Skill Deficits:   Health, family, past  Supports:   Children    Family and Psychosocial History: Family history Marital status: Divorced Divorced, when?: 2017 What types of issues is patient dealing with in the relationship?: Ex wife's health  Additional relationship information: None  Are you sexually active?: Yes What is your sexual orientation?: heterosexual Has your sexual activity been affected by drugs, alcohol, medication, or emotional stress?: n/a Does patient have children?:  Yes How many children?: 2 How is patient's relationship with  their children?: Son and daughter, good relationship   Childhood History:  Childhood History By whom was/is the patient raised?: Both parents Additional childhood history information: "It was rough growing up with my parents. 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 She died of breast cancer, Father: Close relationship  How were you disciplined when you got in trouble as a child/adolescent?: spanked ,grounded Does patient have siblings?: Yes Number of Siblings: 2 Description of patient's current relationship with siblings: one deceased, Brother: strained relationship with him  Did patient suffer any verbal/emotional/physical/sexual abuse as a child?: No Did patient suffer from severe childhood neglect?: No Has patient ever been sexually abused/assaulted/raped as an adolescent or adult?: No Was the patient ever a victim of a crime or a disaster?: No Witnessed domestic violence?: No Has patient been effected by domestic violence as an adult?: No  CCA Part Two B  Employment/Work Situation: Employment / Work Psychologist, occupational Employment situation: On disability Why is patient on disability: COPD How long has patient been on disability: 2014 Patient's job has been impacted by current illness: No What is the longest time patient has a held a job?: 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  Did You Receive Any Psychiatric Treatment/Services While in the Military?: No Are There Guns or Other Weapons in Your Home?: Yes Types of Guns/Weapons: handgun, rifle, shotgun  Are These Weapons Safely Secured?: Yes(t)  Education: Education School  Currently Attending: N/A: Adult  Last Grade Completed: 12 Name of High School: [employer]  Did Garment/textile technologist From [employer]?: Yes Did Theme park manager?: (One year of college) Did You Attend Graduate School?: No Did You Have Any Special Interests In School?: Swimming team, golf team  Did You Have An Individualized Education Program (IIEP): No Did You Have Any Difficulty At School?: Yes Were Any Medications Ever Prescribed For These Difficulties?: No  Religion: Religion/Spirituality Are You A Religious Person?: Yes What is Your Religious Affiliation?: Baptist How Might This Affect Treatment?: Support   Leisure/Recreation: Leisure / Recreation Leisure and Hobbies: Watch tv   Exercise/Diet: Exercise/Diet Do You Exercise?: No Have You Gained or Lost A Significant Amount of Weight in the Past Six Months?: Yes-Lost Number of Pounds Lost?: 20 Do You Follow a Special Diet?: No Do You Have Any Trouble Sleeping?: Yes Explanation of Sleeping Difficulties: Without medication can't sleep but medication helps   CCA Part Two C  Alcohol/Drug Use: Alcohol / Drug Use Pain Medications: see MAR Prescriptions: see MAR Over the Counter: see MAR History of alcohol / drug use?: Yes Substance #1 Name of Substance 1: Cannabis 1 - Age of First Use: 14 1 - Amount (size/oz): a couple of joints 1 - Frequency: twice a week 1 - Duration: Years 1 - Last Use / Amount: Last week                     CCA Part Three  ASAM's:  Six Dimensions of Multidimensional Assessment  Dimension 1:  Acute Intoxication and/or Withdrawal Potential:  Dimension 1:  Comments: None  Dimension 2:  Biomedical Conditions and Complications:  Dimension 2:  Comments: None  Dimension 3:  Emotional, Behavioral, or Cognitive Conditions and Complications:  Dimension 3:  Comments:  None  Dimension 4:  Readiness to Change:  Dimension 4:  Comments: None  Dimension 5:  Relapse, Continued use, or Continued  Problem Potential:  Dimension 5:  Comments: None  Dimension 6:  Recovery/Living Environment:  Dimension 6:  Recovery/Living Environment Comments: None    Substance use Disorder (SUD)    Social Function:  Social Functioning Social Maturity: Isolates Social Judgement: Normal  Stress:  Stress Stressors: Illness, Transitions Coping Ability: Overwhelmed Patient Takes Medications The Way The Doctor Instructed?: Yes Priority Risk: Low Acuity  Risk Assessment- Self-Harm Potential: Risk Assessment For Self-Harm Potential Thoughts of Self-Harm: No current thoughts Method: No plan Availability of Means: No access/NA Additional Information for Self-Harm Potential: Previous Attempts Additional Comments for Self-Harm Potential: Has been hosptialized for suicide attempts in the past   Risk Assessment -Dangerous to Others Potential: Risk Assessment For Dangerous to Others Potential Method: No Plan Availability of Means: No access or NA Intent: Vague intent or NA Notification Required: No need or identified person  DSM5 Diagnoses: Patient Active Problem List   Diagnosis Date Noted  . Bipolar disorder with severe depression (HCC) 02/01/2018  . Alcohol use disorder, severe, dependence (HCC) 02/01/2018  . Cannabis use disorder, severe, dependence (HCC) 02/01/2018  . MDD (major depressive disorder), recurrent episode, severe (HCC) 01/31/2018  . Internal and external bleeding hemorrhoids   . Rectal bleeding   . Iron deficiency anemia due to chronic blood loss 03/09/2017  . Anemia   . GIB (gastrointestinal bleeding) 02/07/2017  . Acute blood loss anemia 02/07/2017  . Weakness generalized 02/07/2017  . Dizziness 02/07/2017  . Peptic ulcer disease   . Bipolar disorder (HCC)   . 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 Dyspnea 08/26/2016  . H/O: depression 08/26/2016  . Dyslipidemia 08/26/2016  . Tobacco use disorder 08/26/2016  . [name] (peripheral vascular disease) (HCC) 06/18/2015  . Chronic  diarrhea 05/02/2013  . Hematochezia 05/02/2013  . Abnormal weight loss 05/02/2013  . Abdominal pain, periumbilical 05/02/2013    Patient Centered Plan: Patient is on the following Treatment Plan(s):  Depression  Recommendations for Services/Supports/Treatments: Recommendations for Services/Supports/Treatments Recommendations For Services/Supports/Treatments: Individual Therapy, Medication Management  Treatment Plan Summary: OP Treatment Plan Summary: [name]" will improve mood as evidenced by dealing with life stressors, taking care of physical health, and coping with past for 5 out of 7 days for 60 days.   Referrals to Alternative Service(s): Referred to Alternative Service(s):   Place:   Date:   Time:    Referred to Alternative Service(s):   Place:   Date:   Time:    Referred to Alternative Service(s):   Place:   Date:   Time:    Referred to Alternative Service(s):   Place:   Date:   Time:     Bynum Bellows, LCSW

## 2018-05-25 NOTE — Telephone Encounter (Signed)
Spoke with pt, advised sample up front to pick up and [facility] [place]. Pt understood and nothing further is needed.    Patient Instructions by Julio SicksParrett, Tammy S, NP at 01/25/2018 3:30 PM  Author: Julio SicksParrett, Tammy S, NP Author Type: Nurse Practitioner Filed: 01/25/2018 3:50 PM  Note Status: Addendum Cosign: Cosign Not Required Encounter Date: 01/25/2018  Editor: Julio SicksParrett, Tammy S, NP (Nurse Practitioner)  Prior Versions: 1. Parrett, Virgel Bouquetammy S, NP (Nurse Practitioner) at 01/25/2018 3:46 PM - Signed    Begin Ipratropium Neb Three times a day   Continue Symbicort 2 puffs Twice daily  , rinse after use.  Work on not smoking Follow up with Dr. Delton CoombesByrum  In 4 months and As needed

## 2018-06-06 ENCOUNTER — Ambulatory Visit (INDEPENDENT_AMBULATORY_CARE_PROVIDER_SITE_OTHER): Payer: Medicare Other | Admitting: Psychiatry

## 2018-06-06 ENCOUNTER — Encounter (HOSPITAL_COMMUNITY): Payer: Self-pay | Admitting: Psychiatry

## 2018-06-06 VITALS — BP 134/79 | HR 52 | Ht 67.0 in | Wt 141.0 lb

## 2018-06-06 DIAGNOSIS — F3162 Bipolar disorder, current episode mixed, moderate: Secondary | ICD-10-CM

## 2018-06-06 MED ORDER — DIVALPROEX SODIUM ER 500 MG PO TB24: 1500.0000 mg | ORAL_TABLET | Freq: Every day | ORAL | 2 refills | Status: DC

## 2018-06-06 MED ORDER — OLANZAPINE 10 MG PO TABS: 15.0000 mg | ORAL_TABLET | Freq: Every day | ORAL | 2 refills | Status: DC

## 2018-06-06 MED ORDER — FLUOXETINE HCL 40 MG PO CAPS: 40.0000 mg | ORAL_CAPSULE | Freq: Every day | ORAL | 2 refills | Status: DC

## 2018-06-06 MED ORDER — HYDROXYZINE HCL 50 MG PO TABS: 50.0000 mg | ORAL_TABLET | Freq: Four times a day (QID) | ORAL | 2 refills | Status: DC | PRN

## 2018-06-06 MED ORDER — TRAZODONE HCL 50 MG PO TABS: 50.0000 mg | ORAL_TABLET | Freq: Every evening | ORAL | 2 refills | Status: DC | PRN

## 2018-06-06 NOTE — Progress Notes (Signed)
BH MD/PA/NP OP Progress Note  06/06/2018 10:01 AM Jeffrey [name]  MRN:  [id number]  Chief Complaint:  Chief Complaint    Depression; Anxiety; Follow-up     HPI: This patient is a 57 year old separated white male who [place] livingalone [place] his [place]'s land near [place]. 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  The patient had been going to [facility] for the last couple of years but they no longer take his insurance and he was therefore referred here. His daughter states that he is not doing well in terms of mood and needs to be seen anyway.  The patient has a long history of depression that dates back to his early 72s. At that time he was married to his first wife and for a while it went well. However when they separated she did not allow him to see his son and this went on for a period of about 8 years. He became increasingly stressed and depressed during this time. He was hospitalized several times for suicide attempts and was in the [facility] twice in 2009. His daughter thinks his last hospital they shows approximately 5 years ago.  The patient married again and he has been married to his second wife for 17 years. She has 3 adult children and one of her sons is very violent. He is finally decided to leave his wife because her son has fought with him several times and he feels like they're trying to take away his land. Over the last month he has been staying with his daughter but is very stressed about the whole situation. He's not able to eat and has lost 40 pounds over last year. His mood is very low and sad. His memory is poor. He smokes marijuana intermittently to try to deal with the stress. He has no interest in anything other than his children and grandchildren. His energy is nonexistent and he cannot get himself out of bed. He sees [name] for primary care and was there recently and apparently there was nothing medically wrong but we do not have  these records. He has had a recent colonoscopy that was negative. He denies auditory or visual hallucinations and does not use alcohol or drugs other than marijuana  He has been on a combination of Depakote Paxil Navane and BuSpar for number of years. Nothing has been change in the last couple of years despite his continued downward slide  The patient returns after 1 month.  He states he has been somewhat down has a low appetite and low energy.  His weight is stayed around the same.  He is sleeping well.  His ex-wife has colorectal cancer and this worries him but he is going to the doctor with her this week to see if they got it all during the surgery.  He states he does not have any real reason to be depressed right now.  He admits he uses marijuana on a frequent basis but denies use of other drugs.  When he was in the hospital recently tested positive for cocaine.  For this reason I will not prescribe any benzodiazepines.  He states that he is "still shaky."  But he does not look shaky right now.  He is using all of his medications including the hydroxyzine.  I offered to increase the olanzapine to help his appetite and anxiety and he agrees.  He denies suicidal ideation and has started counseling here  Visit Diagnosis:  ICD-10-CM   1. Moderate mixed bipolar I disorder (HCC) F31.62     Past Psychiatric History: Several previous hospitalizations for depression, the last one being on 01/31/2018 for suicide attempt  Past Medical History:  Past Medical History:  Diagnosis Date  . Acute blood loss anemia 02/07/2017  . Anxiety   . Arthritis    deg disease, bulging disk,  shoulder level  . Bipolar disorder (HCC)   . Bipolar disorder (HCC)   . 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 Depression    anxiety  . Hyperlipidemia   . Hypertension   . Peptic ulcer disease    Review  . Pneumonia   . [name] (peripheral vascular disease) (HCC) 06/18/2015  . Shortness of breath     Past Surgical History:   Procedure Laterality Date  . BIOPSY  02/09/2017   Procedure: BIOPSY;  Surgeon: West BaliSandi L Fields, MD;  [facility];  Service: Endoscopy;;  duodenal gastric  . COLONOSCOPY  03/14/2007   RUE:[id number]:Normal colonoscopy and terminal ileoscopy except external hemorrhoids  . COLONOSCOPY N/A 05/05/2013   Dr. Jena Gaussourk: external/internal anal canal hemorrhoids, unable to intubate TI, segemental biopsies unremarkable   . COLONOSCOPY N/A 04/11/2017   Procedure: COLONOSCOPY;  Surgeon: West BaliFields, Sandi L, MD;  [facility];  Service: Endoscopy;  Laterality: N/A;  . COLONOSCOPY WITH PROPOFOL N/A 03/31/2017   Procedure: COLONOSCOPY WITH PROPOFOL;  Surgeon: Corbin Adeourk, Robert M, MD;  [facility];  Service: Endoscopy;  Laterality: N/A;  1:45pm  . ESOPHAGOGASTRODUODENOSCOPY  03/14/2007   JXB:[id number]:Nonerosive antral gastritis with bulbar duodenitis/paucity to postbulbar duodenal folds and biopsy were benign with no evidence of villous atrophy.  . ESOPHAGOGASTRODUODENOSCOPY (EGD) WITH PROPOFOL N/A 02/09/2017   Procedure: ESOPHAGOGASTRODUODENOSCOPY (EGD) WITH PROPOFOL;  Surgeon: West BaliSandi L Fields, MD;  [facility];  Service: Endoscopy;  Laterality: N/A;  . GIVENS CAPSULE STUDY N/A 04/08/2017   Procedure: GIVENS CAPSULE STUDY;  Surgeon: Corbin Adeourk, Robert M, MD;  [facility];  Service: Endoscopy;  Laterality: N/A;  . HAND SURGERY     left, secondary to self-inflicted laceration  . HEMORRHOID SURGERY N/A 04/14/2017   Procedure: HEMORRHOIDECTOMY;  Surgeon: Franky MachoJenkins, Mark, MD;  Location: [facility];  Service: General;  Laterality: N/A;  . SHOULDER SURGERY     right  . TOE SURGERY     left great toe , amputated-lawnmover accident    Family Psychiatric History: See below  Family History:  Family History  Problem Relation Age of Onset  . Breast cancer Mother        deceased  . Heart disease Father   . Depression Daughter   . Anxiety disorder Daughter   . Anxiety disorder Son   . Depression Son    . Asthma Brother   . Heart attack Maternal Aunt   . Heart attack Maternal Uncle   . Heart attack Paternal Aunt   . Heart attack Paternal Uncle   . Heart attack Maternal Grandmother   . Heart attack Maternal Grandfather   . Emphysema Maternal Grandfather   . Heart attack Paternal Grandmother   . Heart attack Paternal Grandfather   . Colon cancer Neg Hx   . Liver disease Neg Hx     Social History:  Social History   Socioeconomic History  . Marital status: Divorced    Spouse name: Not on file  . Number of children: 2  . Years of education: Not on file  . Highest education level: Not on file  Occupational  History  . Occupation: Disabled  Social Needs  . Financial resource strain: Not on file  . Food insecurity:    Worry: Not on file    Inability: Not on file  . Transportation needs:    Medical: Not on file    Non-medical: Not on file  Tobacco Use  . Smoking status: Current Every Day Smoker    Packs/day: 1.00    Years: 40.00    Pack years: 40.00    Types: Cigarettes    Start date: 08/04/1971  . Smokeless tobacco: Never Used  . Tobacco comment: peak rate of 2.5ppd, 1/2ppd on 11/27/2016 -- 6 cigarettes / day 01/25/18  Substance and Sexual Activity  . Alcohol use: No    Alcohol/week: 0.0 oz  . Drug use: Yes    Types: Marijuana    Comment: most days   . Sexual activity: Never  Lifestyle  . Physical activity:    Days per week: Not on file    Minutes per session: Not on file  . Stress: Not on file  Relationships  . Social connections:    Talks on phone: Not on file    Gets together: Not on file    Attends religious service: Not on file    Active member of club or organization: Not on file    Attends meetings of clubs or organizations: Not on file    Relationship status: Not on file  Other Topics Concern  . Not on file  Social History Narrative   Originally from [place]. Previously has lived in [place] [facility] [place] 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 Questionable asbestos exposure. Does have significant exposure to fumes. No mold exposure. No bird exposure. No pets currently.     Allergies:  Allergies  Allergen Reactions  . Augmentin [Amoxicillin-Pot Clavulanate] Rash and Other (See Comments)    Has patient had a PCN reaction causing immediate rash, facial/tongue/throat swelling, SOB or lightheadedness with hypotension: No Has patient had a PCN reaction causing severe rash involving mucus membranes or skin necrosis: No Has patient had a PCN reaction that required hospitalization No Has patient had a PCN reaction occurring within the last 10 years: Yes If all of the above answers are "NO", then may proceed with Cephalosporin use.  . Ace Inhibitors Hives    Metabolic Disorder Labs: No results found for: HGBA1C, MPG No results found for: PROLACTIN No results found for: CHOL, TRIG, HDL, CHOLHDL, VLDL, LDLCALC   Therapeutic Level Labs: No results found for: LITHIUM Lab Results  Component Value Date   VALPROATE 14 (L) 01/30/2018   VALPROATE 68.1 05/24/2017   No components found for:  CBMZ  Current Medications: Current Outpatient Medications  Medication Sig Dispense Refill  . albuterol (PROAIR HFA) 108 (90 Base) MCG/ACT inhaler Inhale 2 puffs into the lungs every 4 (four) hours as needed for wheezing or shortness of breath. 1 Inhaler 3  . albuterol (PROVENTIL) (2.5 MG/3ML) 0.083% nebulizer solution Take 3 mLs (2.5 mg total) by nebulization every 4 (four) hours as needed for shortness of breath. 120 mL 3  . aspirin EC 81 MG tablet Take 81 mg by mouth every evening.     Marland Kitchen atorvastatin (LIPITOR) 20 MG tablet Take 1 tablet (20 mg total) by mouth daily. For high cholesterol 30 tablet 0  . budesonide-formoterol (SYMBICORT) 160-4.5 MCG/ACT inhaler Inhale 2 puffs into the lungs 2 (two) times daily. 1 Inhaler 3  .  divalproex (DEPAKOTE ER) 500 MG 24 hr tablet Take 3 tablets (1,500 mg total) by mouth  at bedtime. 90 tablet 2  . FLUoxetine (PROZAC) 40 MG capsule Take 1 capsule (40 mg total) by mouth daily. For mood control 30 capsule 2  . gabapentin (NEURONTIN) 400 MG capsule Take 1 capsule (400 mg total) by mouth 3 (three) times daily. 90 capsule 2  . hydrOXYzine (ATARAX/VISTARIL) 50 MG tablet Take 1 tablet (50 mg total) by mouth every 6 (six) hours as needed for anxiety (sleep). 30 tablet 2  . ipratropium (ATROVENT) 0.02 % nebulizer solution Take 2.5 mLs (0.5 mg total) by nebulization 3 (three) times daily. 225 mL 11  . metoprolol tartrate (LOPRESSOR) 50 MG tablet Take 1 tablet (50 mg total) by mouth 2 (two) times daily. 60 tablet 0  . OLANZapine (ZYPREXA) 10 MG tablet Take 1.5 tablets (15 mg total) by mouth at bedtime. For mood stability 45 tablet 2  . traZODone (DESYREL) 50 MG tablet Take 1 tablet (50 mg total) by mouth at bedtime as needed for sleep. 30 tablet 2   No current facility-administered medications for this visit.      Musculoskeletal: Strength & Muscle Tone: within normal limits Gait & Station: normal Patient leans: N/A  Psychiatric Specialty Exam: Review of Systems  Constitutional: Positive for malaise/fatigue.  Respiratory: Positive for shortness of breath.   Psychiatric/Behavioral: The patient is nervous/anxious.   All other systems reviewed and are negative.   Blood pressure 134/79, pulse (!) 52, height 5\' 7"  (1.702 m), weight 141 lb (64 kg), SpO2 96 %.Body mass index is 22.08 kg/m.  General Appearance: Casual and Fairly Groomed  Eye Contact:  Good  Speech:  Clear and Coherent  Volume:  Decreased  Mood:  Dysphoric  Affect:  Constricted  Thought Process:  Goal Directed  Orientation:  Full (Time, Place, and Person)  Thought Content: Rumination   Suicidal Thoughts:  No  Homicidal Thoughts:  No  Memory:  Immediate;   Good Recent;   Good Remote;   Fair  Judgement:  Fair  Insight:  Lacking  Psychomotor Activity:  Decreased  Concentration:  Concentration:  Fair and Attention Span: Fair  Recall:  Good  Fund of Knowledge: Fair  Language: Good  Akathisia:  No  Handed:  Right  AIMS (if indicated): not done  Assets:  Communication Skills Desire for Improvement Resilience Social Support Talents/Skills  ADL's:  Intact  Cognition: WNL  Sleep:  Good   Screenings: AIMS     [facility]  AIMS Total Score  0    AUDIT     [facility]  Alcohol Use Disorder Identification Test Final Score (AUDIT)  18       Assessment and Plan: Patient is a 57 year old male with a history of bipolar disorder and substance abuse.  He claims that he is more anxious recently but I am unwilling to give him benzodiazepines given his recent substance use.  We will increase his olanzapine to 15 mg daily at bedtime.  He will continue trazodone 50 mg at bedtime for sleep hydroxyzine 50 mg every 6 hours as needed for anxiety, Prozac 40 mg daily for depression and Depakote ER 1500 mg at bedtime for mood stabilization.  He will return to see me in 6 weeks and will continue his counseling here.   Diannia Ruder, MD 06/06/2018, 10:01 AM

## 2018-06-16 ENCOUNTER — Observation Stay (HOSPITAL_BASED_OUTPATIENT_CLINIC_OR_DEPARTMENT_OTHER): Payer: Medicare Other

## 2018-06-16 ENCOUNTER — Other Ambulatory Visit (HOSPITAL_COMMUNITY): Payer: Self-pay | Admitting: *Deleted

## 2018-06-16 ENCOUNTER — Encounter (HOSPITAL_COMMUNITY): Payer: Self-pay | Admitting: Emergency Medicine

## 2018-06-16 ENCOUNTER — Emergency Department (HOSPITAL_COMMUNITY): Payer: Medicare Other

## 2018-06-16 ENCOUNTER — Other Ambulatory Visit: Payer: Self-pay

## 2018-06-16 ENCOUNTER — Observation Stay (HOSPITAL_COMMUNITY)
Admission: EM | Admit: 2018-06-16 | Discharge: 2018-06-17 | Disposition: A | Discharge status: Home / Self Care | Payer: Medicare Other | Attending: Internal Medicine | Admitting: Internal Medicine

## 2018-06-16 DIAGNOSIS — J441 Chronic obstructive pulmonary disease with (acute) exacerbation: Principal | ICD-10-CM | POA: Diagnosis present

## 2018-06-16 DIAGNOSIS — F17210 Nicotine dependence, cigarettes, uncomplicated: Secondary | ICD-10-CM | POA: Diagnosis not present

## 2018-06-16 DIAGNOSIS — R0902 Hypoxemia: Secondary | ICD-10-CM

## 2018-06-16 DIAGNOSIS — J189 Pneumonia, unspecified organism: Secondary | ICD-10-CM | POA: Diagnosis not present

## 2018-06-16 DIAGNOSIS — I351 Nonrheumatic aortic (valve) insufficiency: Secondary | ICD-10-CM | POA: Diagnosis not present

## 2018-06-16 DIAGNOSIS — F319 Bipolar disorder, unspecified: Secondary | ICD-10-CM | POA: Diagnosis present

## 2018-06-16 DIAGNOSIS — I10 Essential (primary) hypertension: Secondary | ICD-10-CM | POA: Insufficient documentation

## 2018-06-16 DIAGNOSIS — F17200 Nicotine dependence, unspecified, uncomplicated: Secondary | ICD-10-CM | POA: Diagnosis present

## 2018-06-16 DIAGNOSIS — I5032 Chronic diastolic (congestive) heart failure: Secondary | ICD-10-CM

## 2018-06-16 DIAGNOSIS — Z79899 Other long term (current) drug therapy: Secondary | ICD-10-CM | POA: Diagnosis not present

## 2018-06-16 DIAGNOSIS — F3131 Bipolar disorder, current episode depressed, mild: Secondary | ICD-10-CM | POA: Diagnosis not present

## 2018-06-16 DIAGNOSIS — Z7982 Long term (current) use of aspirin: Secondary | ICD-10-CM | POA: Diagnosis not present

## 2018-06-16 DIAGNOSIS — R0602 Shortness of breath: Secondary | ICD-10-CM | POA: Diagnosis present

## 2018-06-16 LAB — COMPREHENSIVE METABOLIC PANEL
ALT: 46 U/L — ABNORMAL HIGH (ref 0–44)
AST: 60 U/L — ABNORMAL HIGH (ref 15–41)
Albumin: 3.7 g/dL (ref 3.5–5.0)
Alkaline Phosphatase: 138 U/L — ABNORMAL HIGH (ref 38–126)
Anion gap: 11 (ref 5–15)
BUN: 10 mg/dL (ref 6–20)
CO2: 26 mmol/L (ref 22–32)
Calcium: 9 mg/dL (ref 8.9–10.3)
Chloride: 104 mmol/L (ref 98–111)
Creatinine, Ser: 1.08 mg/dL (ref 0.61–1.24)
GFR calc Af Amer: >60 mL/min (ref >60)
GFR calc non Af Amer: >60 mL/min (ref >60)
Glucose, Bld: 129 mg/dL — ABNORMAL HIGH (ref 70–99)
Potassium: 3.2 mmol/L — ABNORMAL LOW (ref 3.5–5.1)
Sodium: 141 mmol/L (ref 135–145)
Total Bilirubin: 0.7 mg/dL (ref 0.3–1.2)
Total Protein: 6.9 g/dL (ref 6.5–8.1)

## 2018-06-16 LAB — URINALYSIS, ROUTINE W REFLEX MICROSCOPIC
Bilirubin Urine: NEGATIVE
Glucose, UA: NEGATIVE mg/dL
Hgb urine dipstick: NEGATIVE
Ketones, ur: NEGATIVE mg/dL
Leukocytes, UA: NEGATIVE
Nitrite: NEGATIVE
Protein, ur: NEGATIVE mg/dL
Specific Gravity, Urine: 1.012 (ref 1.005–1.030)
pH: 6 (ref 5.0–8.0)

## 2018-06-16 LAB — CBC WITH DIFFERENTIAL/PLATELET
Basophils Absolute: 0 10*3/uL (ref 0.0–0.1)
Basophils Relative: 0 %
Eosinophils Absolute: 0.1 10*3/uL (ref 0.0–0.7)
Eosinophils Relative: 2 %
HCT: 44.5 % (ref 39.0–52.0)
Hemoglobin: 15.1 g/dL (ref 13.0–17.0)
Lymphocytes Relative: 35 %
Lymphs Abs: 2.8 10*3/uL (ref 0.7–4.0)
MCH: 30.1 pg (ref 26.0–34.0)
MCHC: 33.9 g/dL (ref 30.0–36.0)
MCV: 88.6 fL (ref 78.0–100.0)
Monocytes Absolute: 0.6 10*3/uL (ref 0.1–1.0)
Monocytes Relative: 7 %
Neutro Abs: 4.5 10*3/uL (ref 1.7–7.7)
Neutrophils Relative %: 56 %
Platelets: 237 10*3/uL (ref 150–400)
RBC: 5.02 MIL/uL (ref 4.22–5.81)
RDW: 12.8 % (ref 11.5–15.5)
WBC: 8 10*3/uL (ref 4.0–10.5)

## 2018-06-16 LAB — RAPID URINE DRUG SCREEN, HOSP PERFORMED
Amphetamines: NOT DETECTED
Barbiturates: NOT DETECTED
Benzodiazepines: NOT DETECTED
Cocaine: POSITIVE — AB
Opiates: NOT DETECTED
Tetrahydrocannabinol: POSITIVE — AB

## 2018-06-16 LAB — ETHANOL: Alcohol, Ethyl (B): <10 mg/dL (ref <10)

## 2018-06-16 LAB — ECHOCARDIOGRAM COMPLETE
Height: 70 in
Weight: 2320 oz

## 2018-06-16 LAB — TROPONIN I: Troponin I: <0.03 ng/mL (ref <0.03)

## 2018-06-16 LAB — BRAIN NATRIURETIC PEPTIDE: B Natriuretic Peptide: 315 pg/mL — ABNORMAL HIGH (ref 0.0–100.0)

## 2018-06-16 MED ORDER — HYDROXYZINE HCL 25 MG PO TABS: 50.0000 mg | ORAL_TABLET | Freq: Four times a day (QID) | ORAL | Status: DC | PRN

## 2018-06-16 MED ORDER — GUAIFENESIN ER 600 MG PO TB12
600.0000 mg | ORAL_TABLET | Freq: Two times a day (BID) | ORAL | Status: DC
  Administered 2018-06-16 – 2018-06-17 (×3): 600 mg via ORAL
  Filled 2018-06-16 (×3): qty 1

## 2018-06-16 MED ORDER — ONDANSETRON HCL 4 MG/2ML IJ SOLN: 4.0000 mg | Freq: Four times a day (QID) | INTRAMUSCULAR | Status: DC | PRN

## 2018-06-16 MED ORDER — METOPROLOL TARTRATE 50 MG PO TABS
50.0000 mg | ORAL_TABLET | Freq: Two times a day (BID) | ORAL | Status: DC
  Administered 2018-06-16 – 2018-06-17 (×3): 50 mg via ORAL
  Filled 2018-06-16 (×3): qty 1

## 2018-06-16 MED ORDER — POTASSIUM CHLORIDE CRYS ER 20 MEQ PO TBCR
40.0000 meq | EXTENDED_RELEASE_TABLET | Freq: Every day | ORAL | Status: DC
  Administered 2018-06-16 – 2018-06-17 (×2): 40 meq via ORAL
  Filled 2018-06-16 (×2): qty 2

## 2018-06-16 MED ORDER — SODIUM CHLORIDE 0.9% FLUSH
3.0000 mL | INTRAVENOUS | Status: DC | PRN
  Administered 2018-06-16: 3 mL via INTRAVENOUS
  Filled 2018-06-16: qty 3

## 2018-06-16 MED ORDER — METHYLPREDNISOLONE SODIUM SUCC 125 MG IJ SOLR: 60.0000 mg | Freq: Four times a day (QID) | INTRAMUSCULAR | Status: DC

## 2018-06-16 MED ORDER — DIVALPROEX SODIUM ER 500 MG PO TB24
1500.0000 mg | ORAL_TABLET | Freq: Every day | ORAL | Status: DC
  Administered 2018-06-16: 1500 mg via ORAL
  Filled 2018-06-16: qty 3

## 2018-06-16 MED ORDER — BUDESONIDE 0.5 MG/2ML IN SUSP
0.5000 mg | Freq: Two times a day (BID) | RESPIRATORY_TRACT | Status: DC
  Administered 2018-06-16 – 2018-06-17 (×3): 0.5 mg via RESPIRATORY_TRACT
  Filled 2018-06-16 (×3): qty 2

## 2018-06-16 MED ORDER — TRAZODONE HCL 50 MG PO TABS
50.0000 mg | ORAL_TABLET | Freq: Every evening | ORAL | Status: DC | PRN
  Administered 2018-06-16: 50 mg via ORAL
  Filled 2018-06-16: qty 1

## 2018-06-16 MED ORDER — OLANZAPINE 5 MG PO TABS
15.0000 mg | ORAL_TABLET | Freq: Every day | ORAL | Status: DC
  Administered 2018-06-16: 15 mg via ORAL
  Filled 2018-06-16: qty 3

## 2018-06-16 MED ORDER — DOXYCYCLINE HYCLATE 100 MG PO TABS
100.0000 mg | ORAL_TABLET | Freq: Two times a day (BID) | ORAL | Status: DC
  Administered 2018-06-16 – 2018-06-17 (×3): 100 mg via ORAL
  Filled 2018-06-16 (×3): qty 1

## 2018-06-16 MED ORDER — ALBUTEROL (5 MG/ML) CONTINUOUS INHALATION SOLN
10.0000 mg/h | INHALATION_SOLUTION | Freq: Once | RESPIRATORY_TRACT | Status: AC
  Administered 2018-06-16: 10 mg/h via RESPIRATORY_TRACT

## 2018-06-16 MED ORDER — FLUOXETINE HCL 20 MG PO CAPS
40.0000 mg | ORAL_CAPSULE | Freq: Every day | ORAL | Status: DC
  Administered 2018-06-16 – 2018-06-17 (×2): 40 mg via ORAL
  Filled 2018-06-16 (×2): qty 2

## 2018-06-16 MED ORDER — POTASSIUM CHLORIDE CRYS ER 20 MEQ PO TBCR
40.0000 meq | EXTENDED_RELEASE_TABLET | Freq: Once | ORAL | Status: AC
  Administered 2018-06-16: 40 meq via ORAL
  Filled 2018-06-16: qty 2

## 2018-06-16 MED ORDER — SODIUM CHLORIDE 0.9% FLUSH
3.0000 mL | Freq: Two times a day (BID) | INTRAVENOUS | Status: DC
  Administered 2018-06-16 – 2018-06-17 (×2): 3 mL via INTRAVENOUS

## 2018-06-16 MED ORDER — GABAPENTIN 400 MG PO CAPS
400.0000 mg | ORAL_CAPSULE | Freq: Three times a day (TID) | ORAL | Status: DC
  Administered 2018-06-16 – 2018-06-17 (×4): 400 mg via ORAL
  Filled 2018-06-16 (×5): qty 1

## 2018-06-16 MED ORDER — SODIUM CHLORIDE 0.9 % IV SOLN: 250.0000 mL | INTRAVENOUS | Status: DC | PRN

## 2018-06-16 MED ORDER — ALBUTEROL (5 MG/ML) CONTINUOUS INHALATION SOLN
10.0000 mg/h | INHALATION_SOLUTION | Freq: Once | RESPIRATORY_TRACT | Status: AC
  Administered 2018-06-16: 10 mg/h via RESPIRATORY_TRACT
  Filled 2018-06-16: qty 20

## 2018-06-16 MED ORDER — LEVOFLOXACIN IN D5W 750 MG/150ML IV SOLN
750.0000 mg | Freq: Once | INTRAVENOUS | Status: AC
  Administered 2018-06-16: 750 mg via INTRAVENOUS
  Filled 2018-06-16: qty 150

## 2018-06-16 MED ORDER — ASPIRIN EC 81 MG PO TBEC
81.0000 mg | DELAYED_RELEASE_TABLET | Freq: Every evening | ORAL | Status: DC
Start: 2018-06-16 — End: 2018-06-17
  Administered 2018-06-16: 81 mg via ORAL
  Filled 2018-06-16 (×2): qty 1

## 2018-06-16 MED ORDER — IPRATROPIUM BROMIDE 0.02 % IN SOLN: 0.5000 mg | Freq: Four times a day (QID) | RESPIRATORY_TRACT | Status: DC

## 2018-06-16 MED ORDER — IPRATROPIUM-ALBUTEROL 0.5-2.5 (3) MG/3ML IN SOLN
3.0000 mL | Freq: Four times a day (QID) | RESPIRATORY_TRACT | Status: DC
  Administered 2018-06-16 – 2018-06-17 (×5): 3 mL via RESPIRATORY_TRACT
  Filled 2018-06-16 (×5): qty 3

## 2018-06-16 MED ORDER — ATORVASTATIN CALCIUM 20 MG PO TABS
20.0000 mg | ORAL_TABLET | Freq: Every day | ORAL | Status: DC
  Administered 2018-06-16 – 2018-06-17 (×2): 20 mg via ORAL
  Filled 2018-06-16 (×2): qty 1

## 2018-06-16 MED ORDER — ALBUTEROL SULFATE (2.5 MG/3ML) 0.083% IN NEBU: 2.5000 mg | INHALATION_SOLUTION | Freq: Four times a day (QID) | RESPIRATORY_TRACT | Status: DC

## 2018-06-16 MED ORDER — METHYLPREDNISOLONE SODIUM SUCC 125 MG IJ SOLR
60.0000 mg | Freq: Three times a day (TID) | INTRAMUSCULAR | Status: DC
  Administered 2018-06-16 – 2018-06-17 (×3): 60 mg via INTRAVENOUS
  Filled 2018-06-16 (×3): qty 2

## 2018-06-16 MED ORDER — ONDANSETRON HCL 4 MG PO TABS: 4.0000 mg | ORAL_TABLET | Freq: Four times a day (QID) | ORAL | Status: DC | PRN

## 2018-06-16 MED ORDER — MOMETASONE FURO-FORMOTEROL FUM 200-5 MCG/ACT IN AERO: 2.0000 | INHALATION_SPRAY | Freq: Two times a day (BID) | RESPIRATORY_TRACT | Status: DC

## 2018-06-16 MED ORDER — ENOXAPARIN SODIUM 40 MG/0.4ML ~~LOC~~ SOLN
40.0000 mg | SUBCUTANEOUS | Status: DC
  Filled 2018-06-16 (×2): qty 0.4

## 2018-06-16 MED ORDER — LEVOFLOXACIN IN D5W 750 MG/150ML IV SOLN
750.0000 mg | INTRAVENOUS | Status: DC
  Administered 2018-06-17: 750 mg via INTRAVENOUS
  Filled 2018-06-16: qty 150

## 2018-06-16 MED ORDER — FUROSEMIDE 20 MG PO TABS
20.0000 mg | ORAL_TABLET | Freq: Every day | ORAL | Status: DC
  Administered 2018-06-16 – 2018-06-17 (×2): 20 mg via ORAL
  Filled 2018-06-16 (×2): qty 1

## 2018-06-16 NOTE — Progress Notes (Signed)
Pharmacy Antibiotic Note  Jeffrey [name] is a 57 y.o. male admitted on 06/16/2018 with pneumonia.  Pharmacy has been consulted for Levaquin dosing.  Plan: Levaquin 750mg  IV every 24 hours. Monitor labs, micro and vitals.   Height: 5\' 10"  (177.8 cm) Weight: 145 lb (65.8 kg) IBW/kg (Calculated) : 73  Temp (24hrs), Avg:98.6 F (37 C), Min:98.4 F (36.9 C), Max:98.7 F (37.1 C)  Recent Labs  Lab 06/16/18 0151  WBC 8.0  CREATININE 1.08    Estimated Creatinine Clearance: 70.2 mL/min (by C-G formula based on SCr of 1.08 mg/dL).    Allergies  Allergen Reactions  . Augmentin [Amoxicillin-Pot Clavulanate] Rash and Other (See Comments)    Has patient had a PCN reaction causing immediate rash, facial/tongue/throat swelling, SOB or lightheadedness with hypotension: No Has patient had a PCN reaction causing severe rash involving mucus membranes or skin necrosis: No Has patient had a PCN reaction that required hospitalization No Has patient had a PCN reaction occurring within the last 10 years: Yes If all of the above answers are "NO", then may proceed with Cephalosporin use.  . Ace Inhibitors Hives   Antimicrobials this admission: Levaquin 7/25 >>  Doxy 7/25 >>   Dose adjustments this admission: n/a   Microbiology results:  BCx:   UCx:    Sputum:    MRSA PCR:   Thank you for allowing pharmacy to be a part of this patient's care.  Mady GemmaHayes, Bama Hanselman R 06/16/2018 8:44 PM

## 2018-06-16 NOTE — Progress Notes (Signed)
*  PRELIMINARY RESULTS* Echocardiogram 2D Echocardiogram has been performed.  Stacey DrainWhite, Lynn Sissel J 06/16/2018, 12:31 PM

## 2018-06-16 NOTE — ED Notes (Signed)
Pts O2 dropped to 88% on RA.

## 2018-06-16 NOTE — ED Provider Notes (Signed)
[facility] EMERGENCY DEPARTMENT Provider Note   CSN: [id number] Arrival date & time: 06/16/18  0141  Time seen 01:40 AM   History   Chief Complaint Chief Complaint  Patient presents with  . Shortness of Breath    HPI Jeffrey [name] is a 58 y.o. male.  HPI patient states he was fine all day.  He states a few hours ago he started feeling short of breath.  He states he did start having wheezing close to the time he called EMS.  He states he has a nebulizer that he uses with albuterol however he did not use it tonight.  EMS report on their arrival his pulse ox was 92%.  They state he was not wheezing on their exam.  They gave him Solu-Medrol IV and put him on a nonrebreather mask so that his pulse ox improved to 99 to 100%.  They did not give him a nebulizer treatment.  They state when they arrived he was in the tripod position and he was diaphoretic.  When they got here they turned off his oxygen and states it dropped down to 94%.  Patient denies chest pain, fever, swelling of his legs.  He states he has had a cough and is coughing up some gray mucus, he also describes chills.  He states he has had pneumonia before but this feels different.  He states he is supposed to be on 3 inhalers however he can only afford one and that is Symbicort.  He states he takes all of his other medications most of the time.  He states he is not on oxygen at home.  Patient continues to smoke.  PCP Gareth Morgan, MD   Past Medical History:  Diagnosis Date  . Acute blood loss anemia 02/07/2017  . Anxiety   . Arthritis    deg disease, bulging disk,  shoulder level  . Bipolar disorder (HCC)   . Bipolar disorder (HCC)   . 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 Depression    anxiety  . Hyperlipidemia   . Hypertension   . Peptic ulcer disease    Review  . Pneumonia   . [name] (peripheral vascular disease) (HCC) 06/18/2015  . Shortness of breath     Patient Active Problem List   Diagnosis Date Noted  .  Bipolar disorder with severe depression (HCC) 02/01/2018  . Alcohol use disorder, severe, dependence (HCC) 02/01/2018  . Cannabis use disorder, severe, dependence (HCC) 02/01/2018  . MDD (major depressive disorder), recurrent episode, severe (HCC) 01/31/2018  . Internal and external bleeding hemorrhoids   . Rectal bleeding   . Iron deficiency anemia due to chronic blood loss 03/09/2017  . Anemia   . GIB (gastrointestinal bleeding) 02/07/2017  . Acute blood loss anemia 02/07/2017  . Weakness generalized 02/07/2017  . Dizziness 02/07/2017  . Peptic ulcer disease   . Bipolar disorder (HCC)   . 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 Dyspnea 08/26/2016  . H/O: depression 08/26/2016  . Dyslipidemia 08/26/2016  . Tobacco use disorder 08/26/2016  . [name] (peripheral vascular disease) (HCC) 06/18/2015  . Chronic diarrhea 05/02/2013  . Hematochezia 05/02/2013  . Abnormal weight loss 05/02/2013  . Abdominal pain, periumbilical 05/02/2013    Past Surgical History:  Procedure Laterality Date  . BIOPSY  02/09/2017   Procedure: BIOPSY;  Surgeon: [name], MD;  Location: [facility];  Service: Endoscopy;;  duodenal gastric  . COLONOSCOPY  03/14/2007   WUJ:[id number] colonoscopy and terminal ileoscopy except external hemorrhoids  .  COLONOSCOPY N/A 05/05/2013   [name]: external/internal anal canal hemorrhoids, unable to intubate TI, segemental biopsies unremarkable   . COLONOSCOPY N/A 04/11/2017   Procedure: COLONOSCOPY;  Surgeon: [name], MD;  Location: [facility];  Service: Endoscopy;  Laterality: N/A;  . COLONOSCOPY WITH PROPOFOL N/A 03/31/2017   Procedure: COLONOSCOPY WITH PROPOFOL;  Surgeon: [name], MD;  Location: [facility];  Service: Endoscopy;  Laterality: N/A;  1:45pm  . ESOPHAGOGASTRODUODENOSCOPY  03/14/2007   [id number] antral gastritis with bulbar duodenitis/paucity to postbulbar duodenal folds and biopsy were benign with no evidence of villous atrophy.  .  ESOPHAGOGASTRODUODENOSCOPY (EGD) WITH PROPOFOL N/A 02/09/2017   Procedure: ESOPHAGOGASTRODUODENOSCOPY (EGD) WITH PROPOFOL;  Surgeon: [name], MD;  Location: [facility];  Service: Endoscopy;  Laterality: N/A;  . GIVENS CAPSULE STUDY N/A 04/08/2017   Procedure: GIVENS CAPSULE STUDY;  Surgeon: [name], MD;  Location: [facility];  Service: Endoscopy;  Laterality: N/A;  . HAND SURGERY     left, secondary to self-inflicted laceration  . HEMORRHOID SURGERY N/A 04/14/2017   Procedure: HEMORRHOIDECTOMY;  Surgeon: [name], MD;  Location: [facility];  Service: General;  Laterality: N/A;  . SHOULDER SURGERY     right  . TOE SURGERY     left great toe , amputated-lawnmover accident        Home Medications    Prior to Admission medications   Medication Sig Start Date End Date Taking? Authorizing Provider  albuterol (PROAIR HFA) 108 (90 Base) MCG/ACT inhaler Inhale 2 puffs into the lungs every 4 (four) hours as needed for wheezing or shortness of breath. 02/02/17   Roslynn Amble, MD  albuterol (PROVENTIL) (2.5 MG/3ML) 0.083% nebulizer solution Take 3 mLs (2.5 mg total) by nebulization every 4 (four) hours as needed for shortness of breath. 02/02/17   Roslynn Amble, MD  aspirin EC 81 MG tablet Take 81 mg by mouth every evening.     [provider]  atorvastatin (LIPITOR) 20 MG tablet Take 1 tablet (20 mg total) by mouth daily. For high cholesterol 02/09/18   Money, Gerlene Burdock, FNP  budesonide-formoterol (SYMBICORT) 160-4.5 MCG/ACT inhaler Inhale 2 puffs into the lungs 2 (two) times daily. 05/25/18   Parrett, Virgel Bouquet, NP  divalproex (DEPAKOTE ER) 500 MG 24 hr tablet Take 3 tablets (1,500 mg total) by mouth at bedtime. 06/06/18   Myrlene Broker, MD  FLUoxetine (PROZAC) 40 MG capsule Take 1 capsule (40 mg total) by mouth daily. For mood control 06/06/18   Myrlene Broker, MD  gabapentin (NEURONTIN) 400 MG capsule Take 1 capsule (400 mg total) by mouth 3 (three) times daily.  02/22/18   Myrlene Broker, MD  hydrOXYzine (ATARAX/VISTARIL) 50 MG tablet Take 1 tablet (50 mg total) by mouth every 6 (six) hours as needed for anxiety (sleep). 06/06/18   Myrlene Broker, MD  ipratropium (ATROVENT) 0.02 % nebulizer solution Take 2.5 mLs (0.5 mg total) by nebulization 3 (three) times daily. 01/25/18   Parrett, Virgel Bouquet, NP  metoprolol tartrate (LOPRESSOR) 50 MG tablet Take 1 tablet (50 mg total) by mouth 2 (two) times daily. 02/08/18   Money, Gerlene Burdock, FNP  OLANZapine (ZYPREXA) 10 MG tablet Take 1.5 tablets (15 mg total) by mouth at bedtime. For mood stability 06/06/18   Myrlene Broker, MD  traZODone (DESYREL) 50 MG tablet Take 1 tablet (50 mg total) by mouth at bedtime as needed for sleep. 06/06/18   Diannia Ruder  R, MD    Family History Family History  Problem Relation Age of Onset  . Breast cancer Mother        deceased  . Heart disease Father   . Depression Daughter   . Anxiety disorder Daughter   . Anxiety disorder Son   . Depression Son   . Asthma Brother   . Heart attack Maternal Aunt   . Heart attack Maternal Uncle   . Heart attack Paternal Aunt   . Heart attack Paternal Uncle   . Heart attack Maternal Grandmother   . Heart attack Maternal Grandfather   . Emphysema Maternal Grandfather   . Heart attack Paternal Grandmother   . Heart attack Paternal Grandfather   . Colon cancer Neg Hx   . Liver disease Neg Hx     Social History Social History   Tobacco Use  . Smoking status: Current Every Day Smoker    Packs/day: 1.00    Years: 40.00    Pack years: 40.00    Types: Cigarettes    Start date: 08/04/1971  . Smokeless tobacco: Never Used  . Tobacco comment: peak rate of 2.5ppd, 1/2ppd on 11/27/2016 -- 6 cigarettes / day 01/25/18  Substance Use Topics  . Alcohol use: No    Alcohol/week: 0.0 oz  . Drug use: Yes    Types: Marijuana    Comment: most days   smokes 5 cigs a day No oxygen at home   Allergies   Augmentin [amoxicillin-pot clavulanate] and Ace  inhibitors   Review of Systems Review of Systems  All other systems reviewed and are negative.    Physical Exam Updated Vital Signs BP (!) 148/92 (BP Location: Left Arm)   Pulse 76   Temp 98.4 F (36.9 C) (Oral)   Resp (!) 21   Wt 64 kg (141 lb)   SpO2 94%   BMI 22.08 kg/m   Vital signs normal    Physical Exam  Constitutional: He is oriented to person, place, and time. He appears well-developed and well-nourished.  Non-toxic appearance. He does not appear ill. No distress.  HENT:  Head: Normocephalic and atraumatic.  Right Ear: External ear normal.  Left Ear: External ear normal.  Nose: Nose normal. No mucosal edema or rhinorrhea.  Mouth/Throat: Oropharynx is clear and moist and mucous membranes are normal. No dental abscesses or uvula swelling.  Eyes: Pupils are equal, round, and reactive to light. Conjunctivae and EOM are normal.  Neck: Normal range of motion and full passive range of motion without pain. Neck supple.  Cardiovascular: Normal rate, regular rhythm and normal heart sounds. Exam reveals no gallop and no friction rub.  No murmur heard. Pulmonary/Chest: Effort normal. No respiratory distress. He has decreased breath sounds. He has wheezes. He has rhonchi. He has no rales. He exhibits no tenderness and no crepitus.  Patient is able to answer my questions without difficulty, he is no longer tripoding or diaphoretic.  He does not have retractions.  However on his lung exam he does have diffuse rhonchi and some scattered wheezing especially at the bases.  Abdominal: Soft. Normal appearance and bowel sounds are normal. He exhibits no distension. There is no tenderness. There is no rebound and no guarding.  Musculoskeletal: Normal range of motion. He exhibits no edema or tenderness.  Moves all extremities well.   Neurological: He is alert and oriented to person, place, and time. He has normal strength. No cranial nerve deficit.  Skin: Skin is warm, dry and intact. No  rash noted. No erythema. No pallor.  Psychiatric: He has a normal mood and affect. His speech is normal and behavior is normal. His mood appears not anxious.  Nursing note and vitals reviewed.    ED Treatments / Results  Labs (all labs ordered are listed, but only abnormal results are displayed) Results for orders placed or performed during the hospital encounter of 06/16/18  Comprehensive metabolic panel  Result Value Ref Range   Sodium 141 135 - 145 mmol/L   Potassium 3.2 (L) 3.5 - 5.1 mmol/L   Chloride 104 98 - 111 mmol/L   CO2 26 22 - 32 mmol/L   Glucose, Bld 129 (H) 70 - 99 mg/dL   BUN 10 6 - 20 mg/dL   Creatinine, Ser 1.61 0.61 - 1.24 mg/dL   Calcium 9.0 8.9 - 09.6 mg/dL   Total Protein 6.9 6.5 - 8.1 g/dL   Albumin 3.7 3.5 - 5.0 g/dL   AST 60 (H) 15 - 41 U/L   ALT 46 (H) 0 - 44 U/L   Alkaline Phosphatase 138 (H) 38 - 126 U/L   Total Bilirubin 0.7 0.3 - 1.2 mg/dL   GFR calc non Af Amer >60 >60 mL/min   GFR calc Af Amer >60 >60 mL/min   Anion gap 11 5 - 15  Brain natriuretic peptide  Result Value Ref Range   B Natriuretic Peptide 315.0 (H) 0.0 - 100.0 pg/mL  Troponin I  Result Value Ref Range   Troponin I <0.03 <0.03 ng/mL  CBC with Differential  Result Value Ref Range   WBC 8.0 4.0 - 10.5 K/uL   RBC 5.02 4.22 - 5.81 MIL/uL   Hemoglobin 15.1 13.0 - 17.0 g/dL   HCT 04.5 40.9 - 81.1 %   MCV 88.6 78.0 - 100.0 fL   MCH 30.1 26.0 - 34.0 pg   MCHC 33.9 30.0 - 36.0 g/dL   RDW 91.4 78.2 - 95.6 %   Platelets 237 150 - 400 K/uL   Neutrophils Relative % 56 %   Neutro Abs 4.5 1.7 - 7.7 K/uL   Lymphocytes Relative 35 %   Lymphs Abs 2.8 0.7 - 4.0 K/uL   Monocytes Relative 7 %   Monocytes Absolute 0.6 0.1 - 1.0 K/uL   Eosinophils Relative 2 %   Eosinophils Absolute 0.1 0.0 - 0.7 K/uL   Basophils Relative 0 %   Basophils Absolute 0.0 0.0 - 0.1 K/uL  Ethanol  Result Value Ref Range   Alcohol, Ethyl (B) <10 <10 mg/dL   Laboratory interpretation all normal except  hypokalemia, minor elevation of BNP    EKG EKG Interpretation  Date/Time:  Thursday June 16 2018 01:42:51 EDT Ventricular Rate:  82 PR Interval:    QRS Duration: 94 QT Interval:  415 QTC Calculation: 485 R Axis:   89 Text Interpretation:  Sinus rhythm Probable left atrial enlargement Probable left ventricular hypertrophy Borderline prolonged QT interval Baseline wander Since last tracing rate faster 30 Jan 2018 Confirmed by Devoria Albe ([id number]) on 06/16/2018 2:17:08 AM   Radiology Dg Chest Port 1 View  Result Date: 06/16/2018 CLINICAL DATA:  Patient woke from sleep with shortness of breath tonight. Productive cough. History of COPD, hypertension, pneumonia, and current smoker. EXAM: PORTABLE CHEST 1 VIEW COMPARISON:  12/21/2017 FINDINGS: Normal heart size. Perihilar and basilar infiltrates bilaterally may indicate edema or pneumonia. No pleural effusions. No pneumothorax. Mediastinal contours appear intact. No focal consolidation. IMPRESSION: Perihilar and basilar infiltrates bilaterally may indicate edema or pneumonia. Electronically Signed  By: Burman Nieves M.D.   On: 06/16/2018 02:06    Procedures .Critical Care Performed by: Devoria Albe, MD Authorized by: Devoria Albe, MD   Critical care provider statement:    Critical care time (minutes):  40   Critical care was necessary to treat or prevent imminent or life-threatening deterioration of the following conditions:  Respiratory failure   Critical care was time spent personally by me on the following activities:  Discussions with consultants, evaluation of patient's response to treatment, examination of patient, obtaining history from patient or surrogate, ordering and review of laboratory studies, ordering and review of radiographic studies, pulse oximetry, re-evaluation of patient's condition and review of old charts   (including critical care time)  Medications Ordered in ED Medications  potassium chloride SA (K-DUR,KLOR-CON)  CR tablet 40 mEq (has no administration in time range)  albuterol (PROVENTIL,VENTOLIN) solution continuous neb (10 mg/hr Nebulization Given 06/16/18 0201)  levofloxacin (LEVAQUIN) IVPB 750 mg (0 mg Intravenous Stopped 06/16/18 0549)  albuterol (PROVENTIL,VENTOLIN) solution continuous neb (10 mg/hr Nebulization Given 06/16/18 0429)     Initial Impression / Assessment and Plan / ED Course  I have reviewed the triage vital signs and the nursing notes.  Pertinent labs & imaging results that were available during my care of the patient were reviewed by me and considered in my medical decision making (see chart for details).     Patient was given albuterol 10 mg/h continuous nebulizer.  EMS had already given him Solu-Medrol IV.  Laboratory testing was done including alcohol level due to his documented history of alcohol abuse and UDS due to his recent positive UDS for cocaine.  I noticed patient's chest x-ray result, however the radiologist read it as possible edema and/or pneumonia.  I was waiting for his BNP to result which was minimally elevated but not significantly. His last admission was in March, so almost 4 1/2 months ago.  Therefore he was started on community-acquired pneumonia antibiotics, he has an allergy to Augmentin so he was given IV Levaquin.  Recheck at 2:50 AM patient still has a lot of his continuous nebulizer left.  When I briefly listen to him he still has scattered wheezing and rhonchi.  Recheck at 4:15 AM patient has finished his continuous nebulizer.  He has a nasal cannula in his nose but it is not turned on.  His pulse ox was 88 to 91%.  He was given a second continuous nebulizer of albuterol.  Patient really wants to go home.  I discussed with him that we would recheck him after his second nebulizer however if he had the hypoxia he would need to stay in the hospital.  Recheck at 5:45 AM patient has almost finished his second continuous nebulizer of 10 mg of albuterol.  He  states he is feeling better.  However when I listen to him he does have improved air movement however he still has scattered wheezing without rhonchi now.  We discussed that he really should be admitted and he is agreeable.  05:52 AM Dr Sharl Ma hospitalist, will admit  Final Clinical Impressions(s) / ED Diagnoses   Final diagnoses:  COPD exacerbation (HCC)  Hypoxia  Community acquired pneumonia, unspecified laterality    Plan admission  Devoria Albe, MD, Concha Pyo, MD 06/16/18 [id number]

## 2018-06-16 NOTE — Progress Notes (Signed)
Patient seen and examined. Admitted after midnight secondary to SOB, clear phlegm productive cough and requiring oxygen supplementation. Work up demonstrating elevated BNP and CXR with vascular congestion and bronchitis. He is improving. Will check 2-D echo, continue steroids, start pulmicort, flutter valve and low dose lasix. Long discussion about smoking cessation and low sodium diet provided.  For further info/details on admission please refer to H&P written by Dr. Sharl MaLama.  Vassie Lollarlos Aislynn Cifelli MD [phone]

## 2018-06-16 NOTE — Care Management Obs Status (Signed)
MEDICARE OBSERVATION STATUS NOTIFICATION   Patient Details  Name: Jeffrey [name] MRN: [id number] Date of Birth: 06/19/1961   Medicare Observation Status Notification Given:  Yes    Malcolm MetroChildress, Fiana Gladu Demske, RN 06/16/2018, 3:25 PM

## 2018-06-16 NOTE — ED Triage Notes (Signed)
Pt brought in by [facility] with complaints of SOB. Pt went to his PCP yesterday for routine checkup. Pt states he was woken from sleep tonight with SOB. Pt given 125mg  solumedrol by EMS.

## 2018-06-16 NOTE — H&P (Addendum)
TRH H&P    Patient Demographics:    Jeffrey [name], is a 57 y.o. male  MRN: [id number]  DOB - 08/14/1961  Admit Date - 06/16/2018  Referring MD/NP/PA: Dr Lynelle Doctor  Outpatient Primary MD for the patient is Gareth Morgan, MD  Patient coming from: Home  Chief complaint- Shortness of breath   HPI:    Jeffrey [name]  is a 57 y.o. male, with h/o COPD, bipolar disorder came to ED with complaints of shortness of breath. He also has been coughing up clear phlegm, denies chest pain.  He denies nausea, vomiting or diarrhea. Denies dysuria.He says that he followed by cardiologist at [facility]. Labwork today showed BNP elevated to 315. Potassium is 3.2     Review of systems:    In addition to the HPI above,  No Fever-chills, No Headache, No changes with Vision or hearing, No problems swallowing food or Liquids, No Abdominal pain, No Nausea or Vomiting, bowel movements are regular, No Blood in stool or Urine, No dysuria, No new skin rashes or bruises, No new joints pains-aches,  No new weakness, tingling, numbness in any extremity, No recent weight gain or loss, No polyuria, polydypsia or polyphagia,   All other systems reviewed and are negative.   With Past History of the following :    Past Medical History:  Diagnosis Date  . Acute blood loss anemia 02/07/2017  . Anxiety   . Arthritis    deg disease, bulging disk,  shoulder level  . Bipolar disorder (HCC)   . Bipolar disorder (HCC)   . 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 Depression    anxiety  . Hyperlipidemia   . Hypertension   . Peptic ulcer disease    Review  . Pneumonia   . [name] (peripheral vascular disease) (HCC) 06/18/2015  . Shortness of breath       Past Surgical History:  Procedure Laterality Date  . BIOPSY  02/09/2017   Procedure: BIOPSY;  Surgeon: [name], MD;  [facility]: [facility];  Service: Endoscopy;;  duodenal gastric  .  COLONOSCOPY  03/14/2007   NWG:NFAOZH colonoscopy and terminal ileoscopy except external hemorrhoids  . COLONOSCOPY N/A 05/05/2013   [name]: external/internal anal canal hemorrhoids, unable to intubate TI, segemental biopsies unremarkable   . COLONOSCOPY N/A 04/11/2017   Procedure: COLONOSCOPY;  Surgeon: [name], MD;  Location: [facility];  Service: Endoscopy;  Laterality: N/A;  . COLONOSCOPY WITH PROPOFOL N/A 03/31/2017   Procedure: COLONOSCOPY WITH PROPOFOL;  Surgeon: [name], MD;  Location: [facility];  Service: Endoscopy;  Laterality: N/A;  1:45pm  . ESOPHAGOGASTRODUODENOSCOPY  03/14/2007   YQM:VHQIONGEXB antral gastritis with bulbar duodenitis/paucity to postbulbar duodenal folds and biopsy were benign with no evidence of villous atrophy.  . ESOPHAGOGASTRODUODENOSCOPY (EGD) WITH PROPOFOL N/A 02/09/2017   Procedure: ESOPHAGOGASTRODUODENOSCOPY (EGD) WITH PROPOFOL;  Surgeon: [name], MD;  Location: [facility];  Service: Endoscopy;  Laterality: N/A;  . GIVENS CAPSULE STUDY N/A 04/08/2017   Procedure: GIVENS CAPSULE STUDY;  Surgeon: Eula Listen  M, MD;  Location: [facility];  Service: Endoscopy;  Laterality: N/A;  . HAND SURGERY     left, secondary to self-inflicted laceration  . HEMORRHOID SURGERY N/A 04/14/2017   Procedure: HEMORRHOIDECTOMY;  Surgeon: [name], MD;  Location: [facility];  Service: General;  Laterality: N/A;  . SHOULDER SURGERY     right  . TOE SURGERY     left great toe , amputated-lawnmover accident      Social History:      Social History   Tobacco Use  . Smoking status: Current Every Day Smoker    Packs/day: 1.00    Years: 40.00    Pack years: 40.00    Types: Cigarettes    Start date: 08/04/1971  . Smokeless tobacco: Never Used  . Tobacco comment: peak rate of 2.5ppd, 1/2ppd on 11/27/2016 -- 6 cigarettes / day 01/25/18  Substance Use Topics  . Alcohol use: No    Alcohol/week: 0.0 oz       Family History :     Family  History  Problem Relation Age of Onset  . Breast cancer Mother        deceased  . Heart disease Father   . Depression Daughter   . Anxiety disorder Daughter   . Anxiety disorder Son   . Depression Son   . Asthma Brother   . Heart attack Maternal Aunt   . Heart attack Maternal Uncle   . Heart attack Paternal Aunt   . Heart attack Paternal Uncle   . Heart attack Maternal Grandmother   . Heart attack Maternal Grandfather   . Emphysema Maternal Grandfather   . Heart attack Paternal Grandmother   . Heart attack Paternal Grandfather   . Colon cancer Neg Hx   . Liver disease Neg Hx       Home Medications:   Prior to Admission medications   Medication Sig Start Date End Date Taking? Authorizing Provider  albuterol (PROAIR HFA) 108 (90 Base) MCG/ACT inhaler Inhale 2 puffs into the lungs every 4 (four) hours as needed for wheezing or shortness of breath. 02/02/17   Roslynn Amble, MD  albuterol (PROVENTIL) (2.5 MG/3ML) 0.083% nebulizer solution Take 3 mLs (2.5 mg total) by nebulization every 4 (four) hours as needed for shortness of breath. 02/02/17   Roslynn Amble, MD  aspirin EC 81 MG tablet Take 81 mg by mouth every evening.     [provider]  atorvastatin (LIPITOR) 20 MG tablet Take 1 tablet (20 mg total) by mouth daily. For high cholesterol 02/09/18   Money, Gerlene Burdock, FNP  budesonide-formoterol (SYMBICORT) 160-4.5 MCG/ACT inhaler Inhale 2 puffs into the lungs 2 (two) times daily. 05/25/18   Parrett, Virgel Bouquet, NP  divalproex (DEPAKOTE ER) 500 MG 24 hr tablet Take 3 tablets (1,500 mg total) by mouth at bedtime. 06/06/18   Myrlene Broker, MD  FLUoxetine (PROZAC) 40 MG capsule Take 1 capsule (40 mg total) by mouth daily. For mood control 06/06/18   Myrlene Broker, MD  gabapentin (NEURONTIN) 400 MG capsule Take 1 capsule (400 mg total) by mouth 3 (three) times daily. 02/22/18   Myrlene Broker, MD  hydrOXYzine (ATARAX/VISTARIL) 50 MG tablet Take 1 tablet (50 mg total) by mouth  every 6 (six) hours as needed for anxiety (sleep). 06/06/18   Myrlene Broker, MD  ipratropium (ATROVENT) 0.02 % nebulizer solution Take 2.5 mLs (0.5 mg total) by nebulization 3 (three) times daily. 01/25/18   Parrett, Virgel Bouquet,  NP  metoprolol tartrate (LOPRESSOR) 50 MG tablet Take 1 tablet (50 mg total) by mouth 2 (two) times daily. 02/08/18   Money, Gerlene Burdock, FNP  OLANZapine (ZYPREXA) 10 MG tablet Take 1.5 tablets (15 mg total) by mouth at bedtime. For mood stability 06/06/18   Myrlene Broker, MD  traZODone (DESYREL) 50 MG tablet Take 1 tablet (50 mg total) by mouth at bedtime as needed for sleep. 06/06/18   Myrlene Broker, MD     Allergies:     Allergies  Allergen Reactions  . Augmentin [Amoxicillin-Pot Clavulanate] Rash and Other (See Comments)    Has patient had a PCN reaction causing immediate rash, facial/tongue/throat swelling, SOB or lightheadedness with hypotension: No Has patient had a PCN reaction causing severe rash involving mucus membranes or skin necrosis: No Has patient had a PCN reaction that required hospitalization No Has patient had a PCN reaction occurring within the last 10 years: Yes If all of the above answers are "NO", then may proceed with Cephalosporin use.  Donivan Scull Inhibitors Hives     Physical Exam:   Vitals  Blood pressure 122/61, pulse 87, temperature 98.4 F (36.9 C), temperature source Oral, resp. rate (!) 23, weight 64 kg (141 lb), SpO2 98 %.  1.  General: Appears in no acute distress  2. Psychiatric:  Intact judgement and  insight, awake alert, oriented x 3.  3. Neurologic: No focal neurological deficits, all cranial nerves intact.Strength 5/5 all 4 extremities, sensation intact all 4 extremities, plantars down going.  4. Eyes :  anicteric sclerae, moist conjunctivae with no lid lag. PERRLA.  5. ENMT:  Oropharynx clear with moist mucous membranes and good dentition  6. Neck:  supple, no cervical lymphadenopathy appriciated, No thyromegaly  7.  Respiratory : Normal respiratory effort, bilateral wheezing  8. Cardiovascular : RRR, no gallops, rubs or murmurs, no leg edema  9. Gastrointestinal:  Positive bowel sounds, abdomen soft, non-tender to palpation,no hepatosplenomegaly, no rigidity or guarding       10. Skin:  No cyanosis, normal texture and turgor, no rash, lesions or ulcers  11.Musculoskeletal:  Good muscle tone,  joints appear normal , no effusions,  normal range of motion    Data Review:    CBC Recent Labs  Lab 06/16/18 0151  WBC 8.0  HGB 15.1  HCT 44.5  PLT 237  MCV 88.6  MCH 30.1  MCHC 33.9  RDW 12.8  LYMPHSABS 2.8  MONOABS 0.6  EOSABS 0.1  BASOSABS 0.0   ------------------------------------------------------------------------------------------------------------------  Chemistries  Recent Labs  Lab 06/16/18 0151  NA 141  K 3.2*  CL 104  CO2 26  GLUCOSE 129*  BUN 10  CREATININE 1.08  CALCIUM 9.0  AST 60*  ALT 46*  ALKPHOS 138*  BILITOT 0.7   ------------------------------------------------------------------------------------------------------------------  ------------------------------------------------------------------------------------------------------------------ GFR: Estimated Creatinine Clearance: 68.3 mL/min (by C-G formula based on SCr of 1.08 mg/dL). Liver Function Tests: Recent Labs  Lab 06/16/18 0151  AST 60*  ALT 46*  ALKPHOS 138*  BILITOT 0.7  PROT 6.9  ALBUMIN 3.7   No results for input(s): LIPASE, AMYLASE in the last 168 hours. No results for input(s): AMMONIA in the last 168 hours. Coagulation Profile: No results for input(s): INR, PROTIME in the last 168 hours. Cardiac Enzymes: Recent Labs  Lab 06/16/18 0151  TROPONINI <0.03   BNP (last 3 results) No results for input(s): PROBNP in the last 8760 hours. HbA1C: No results for input(s): HGBA1C in the last 72 hours. CBG: No  results for input(s): GLUCAP in the last 168 hours. Lipid Profile: No  results for input(s): CHOL, HDL, LDLCALC, TRIG, CHOLHDL, LDLDIRECT in the last 72 hours. Thyroid Function Tests: No results for input(s): TSH, T4TOTAL, FREET4, T3FREE, THYROIDAB in the last 72 hours. Anemia Panel: No results for input(s): VITAMINB12, FOLATE, FERRITIN, TIBC, IRON, RETICCTPCT in the last 72 hours.  --------------------------------------------------------------------------------------------------------------- Urine analysis:    Component Value Date/Time   COLORURINE YELLOW 03/01/2011 1427   APPEARANCEUR CLEAR 03/01/2011 1427   LABSPEC 1.010 03/01/2011 1427   PHURINE 7.0 03/01/2011 1427   GLUCOSEU NEGATIVE 03/01/2011 1427   HGBUR NEGATIVE 03/01/2011 1427   BILIRUBINUR NEGATIVE 03/01/2011 1427   KETONESUR NEGATIVE 03/01/2011 1427   PROTEINUR NEGATIVE 03/01/2011 1427   UROBILINOGEN 0.2 03/01/2011 1427   NITRITE NEGATIVE 03/01/2011 1427   LEUKOCYTESUR  03/01/2011 1427    NEGATIVE MICROSCOPIC NOT DONE ON URINES WITH NEGATIVE PROTEIN, BLOOD, LEUKOCYTES, NITRITE, OR GLUCOSE <1000 mg/dL.      Imaging Results:    Dg Chest Port 1 View  Result Date: 06/16/2018 CLINICAL DATA:  Patient woke from sleep with shortness of breath tonight. Productive cough. History of COPD, hypertension, pneumonia, and current smoker. EXAM: PORTABLE CHEST 1 VIEW COMPARISON:  12/21/2017 FINDINGS: Normal heart size. Perihilar and basilar infiltrates bilaterally may indicate edema or pneumonia. No pleural effusions. No pneumothorax. Mediastinal contours appear intact. No focal consolidation. IMPRESSION: Perihilar and basilar infiltrates bilaterally may indicate edema or pneumonia. Electronically Signed   By: Burman NievesWilliam  Stevens M.D.   On: 06/16/2018 02:06    My personal review of EKG: Rhythm NSR   Assessment & Plan:    Active Problems:   Tobacco use disorder   Bipolar disorder (HCC)   COPD exacerbation (HCC)   1. COPD exacerbation- will start solumedrol 60 mg q 6 hrs, duonebs q 6 hrs, Mucinex 1 tab  po bid. 2. Bipolar disorder- continue Zyprexa, depakote, prozac. 3. Hypokalemia- potassium was replaced in ED, will check BMP in am. 4. ? CAP- CXR shows perihilar and basilar infiltrates, may indicate edema or pneumonia. Patient started on Lrevaquin per pharmacy.    DVT Prophylaxis-   Lovenox   AM Labs Ordered, also please review Full Orders  Family Communication: Admission, patients condition and plan of care including tests being ordered have been discussed with the patient who indicate understanding and agree with the plan and Code Status.  Code Status:  DNR  Admission status: Observation    Time spent in minutes : 60 min   Meredeth IdeGagan S Lama M.D on 06/16/2018 at 6:07 AM  Between 7am to 7pm - Pager - [phone]. After 7pm go to [email or web address] - password [email or web address]  [facility] - Office  [phone]

## 2018-06-17 DIAGNOSIS — Z720 Tobacco use: Secondary | ICD-10-CM

## 2018-06-17 DIAGNOSIS — I5033 Acute on chronic diastolic (congestive) heart failure: Secondary | ICD-10-CM

## 2018-06-17 DIAGNOSIS — F3131 Bipolar disorder, current episode depressed, mild: Secondary | ICD-10-CM | POA: Diagnosis not present

## 2018-06-17 DIAGNOSIS — J441 Chronic obstructive pulmonary disease with (acute) exacerbation: Secondary | ICD-10-CM | POA: Diagnosis not present

## 2018-06-17 DIAGNOSIS — I5032 Chronic diastolic (congestive) heart failure: Secondary | ICD-10-CM

## 2018-06-17 DIAGNOSIS — F17200 Nicotine dependence, unspecified, uncomplicated: Secondary | ICD-10-CM

## 2018-06-17 DIAGNOSIS — J189 Pneumonia, unspecified organism: Secondary | ICD-10-CM | POA: Diagnosis not present

## 2018-06-17 DIAGNOSIS — R0902 Hypoxemia: Secondary | ICD-10-CM

## 2018-06-17 LAB — COMPREHENSIVE METABOLIC PANEL
ALT: 36 U/L (ref 0–44)
AST: 33 U/L (ref 15–41)
Albumin: 3.4 g/dL — ABNORMAL LOW (ref 3.5–5.0)
Alkaline Phosphatase: 104 U/L (ref 38–126)
Anion gap: 9 (ref 5–15)
BUN: 16 mg/dL (ref 6–20)
CO2: 26 mmol/L (ref 22–32)
Calcium: 9.4 mg/dL (ref 8.9–10.3)
Chloride: 102 mmol/L (ref 98–111)
Creatinine, Ser: 0.88 mg/dL (ref 0.61–1.24)
GFR calc Af Amer: >60 mL/min (ref >60)
GFR calc non Af Amer: >60 mL/min (ref >60)
Glucose, Bld: 155 mg/dL — ABNORMAL HIGH (ref 70–99)
Potassium: 4.3 mmol/L (ref 3.5–5.1)
Sodium: 137 mmol/L (ref 135–145)
Total Bilirubin: 0.6 mg/dL (ref 0.3–1.2)
Total Protein: 6.5 g/dL (ref 6.5–8.1)

## 2018-06-17 LAB — CBC
HCT: 42.1 % (ref 39.0–52.0)
Hemoglobin: 14.1 g/dL (ref 13.0–17.0)
MCH: 29.9 pg (ref 26.0–34.0)
MCHC: 33.5 g/dL (ref 30.0–36.0)
MCV: 89.2 fL (ref 78.0–100.0)
Platelets: 242 10*3/uL (ref 150–400)
RBC: 4.72 MIL/uL (ref 4.22–5.81)
RDW: 13 % (ref 11.5–15.5)
WBC: 15.9 10*3/uL — ABNORMAL HIGH (ref 4.0–10.5)

## 2018-06-17 LAB — HIV ANTIBODY (ROUTINE TESTING W REFLEX): HIV Screen 4th Generation wRfx: NONREACTIVE

## 2018-06-17 MED ORDER — POTASSIUM CHLORIDE CRYS ER 20 MEQ PO TBCR: 20.0000 meq | EXTENDED_RELEASE_TABLET | Freq: Every day | ORAL | 1 refills | Status: DC

## 2018-06-17 MED ORDER — PREDNISONE 10 MG PO TABS: ORAL_TABLET | ORAL | 0 refills | Status: DC

## 2018-06-17 MED ORDER — PANTOPRAZOLE SODIUM 20 MG PO TBEC: 20.0000 mg | DELAYED_RELEASE_TABLET | Freq: Every day | ORAL | 1 refills | Status: DC

## 2018-06-17 MED ORDER — FUROSEMIDE 20 MG PO TABS: 20.0000 mg | ORAL_TABLET | Freq: Every day | ORAL | 1 refills | Status: DC

## 2018-06-17 MED ORDER — NICOTINE 14 MG/24HR TD PT24: 14.0000 mg | MEDICATED_PATCH | TRANSDERMAL | 1 refills | Status: DC

## 2018-06-17 MED ORDER — LEVOFLOXACIN 750 MG PO TABS: 750.0000 mg | ORAL_TABLET | Freq: Every day | ORAL | Status: DC

## 2018-06-17 MED ORDER — DOXYCYCLINE HYCLATE 100 MG PO TABS: 100.0000 mg | ORAL_TABLET | Freq: Two times a day (BID) | ORAL | 0 refills | Status: DC

## 2018-06-17 NOTE — Discharge Summary (Signed)
Physician Discharge Summary  Marni Griffondward T Tobon NFA:[id number] DOB: February 29, 1961 DOA: 06/16/2018  PCP: Gareth MorganKnowlton, Steve, MD  Admit date: 06/16/2018 Discharge date: 06/17/2018  Time spent: 30 minutes  Recommendations for Outpatient Follow-up:  1. Repeat BMET to follow electrolytes and renal function 2. Reassess BP and volume status; adjust antihypertensive regimen and diuretics as needed. 3. Outpatient follow up with pulmonary service for PFT's and further adjustments on his COPD maintenance therapy. 4. Continue helping patient with tobacco cessation. 5. Repeat CXR in 6 weeks to assure resolution of infiltrates.   Discharge Diagnoses:  Hypoxia COPD exacerbation CAP/bronchiectasis  Acute on chronic diastolic HF Bipolar disorder  Tobacco abuse  GERD  Discharge Condition: stable and improved. Discharge home with instructions to follow up with PCP in 10 days.   Diet recommendation: heart healthy diet   Filed Weights   06/16/18 0146 06/16/18 0742  Weight: 64 kg (141 lb) 65.8 kg (145 lb)    History of present illness:  As per H&P written by Dr. Sharl MaLama on 06/16/18  Jeffrey [name]  is a 57 y.o. male, with h/o COPD, bipolar disorder came to ED with complaints of shortness of breath. He also has been coughing up clear phlegm, denies chest pain.  He denies nausea, vomiting or diarrhea. Denies dysuria.[facility] Labwork today showed BNP elevated to 315. Patient was found with low Potassium is 3.2  Hospital Course:  1-acute resp failure with hypoxia: in the setting of COPD exacerbation, CAP and bronchiectasis. -patient improved and without resp distress -no needing oxygen supplementation  -patient discharge on steroids tapering, doxycycline and resumption of home inhaler/nebulizer therapy. -patient will need repeat PFT's and follow up with pulmonary service. -repeat CXR in 6 weeks to assure resolution of infiltrates   2-acute on chronic diastolic  HF -elevated BNP and crackles on admission -echo was stable -low dose lasix and instructions for low sodium diet provided -patient also advised to check weight on daily basis   3-tobacco abuse -I have discussed tobacco cessation with the patient.  I have counseled the patient regarding the negative impacts of continued tobacco use including but not limited to lung cancer, COPD, and cardiovascular disease.  I have discussed alternatives to tobacco and modalities that may help facilitate tobacco cessation including but not limited to biofeedback, hypnosis, and medications.  Total time spent with tobacco counseling was 5 minutes -nicotine patch prescription provided at discharge.  4-HTN -stable and well controlled -will continue metoprolol and low dose lasix -follow VS and further adjust regimen as needed   5-bipolar disorder -no hallucinations or SI -resume home psych regimen   6-hypokalemia -repleted  -patient discharge on potassium supplementation  7-GERD and GI prophylaxis -continue PPI  Procedures:  See below for x-ray reports  2-D echo - Left ventricle: The cavity size was normal. Wall thickness was   normal. Systolic function was normal. The estimated ejection   fraction was in the range of 60% to 65%. Wall motion was normal;   there were no regional wall motion abnormalities. Indeterminate   diastolic function. - Aortic valve: Mildly calcified annulus. Trileaflet. There was   mild regurgitation. Mean gradient (S): 5 mm Hg. Peak gradient   (S): 10 mm Hg. Valve area (VTI): 2.31 cm^2. - Mitral valve: Mildly thickened leaflets. Mobility was mildly   restricted. There was mild to moderate regurgitation. At least   mild mitral stenosis suspected. Valve area by pressure half-time:   1.42 cm^2. - Left atrium: The atrium  was moderately dilated. - Right atrium: Central venous pressure (est): 3 mm Hg. - Atrial septum: No defect or patent foramen ovale was identified. -  Tricuspid valve: There was trivial regurgitation. - Pulmonary arteries: Systolic pressure could not be accurately   estimated. - Pericardium, extracardiac: There was no pericardial effusion.  Consultations:  None   Discharge Exam: Vitals:   06/17/18 0511 06/17/18 0731  BP: 129/79   Pulse: 85   Resp: 18   Temp: 98.1 F (36.7 C)   SpO2: 98% 95%    General: afebrile, improved air movement, no CP, no SOB and ready to go home.  Cardiovascular: S1 and S2, no rubs, no gallops, no JVD Respiratory: improved air movement, no using accessory muscles, no requiring oxygen supplementation; very little exp wheezing and no crackles.  Abd: soft, NT, ND, positive BS Extremities: no edema, no cyanosis, no clubbing Neuro: no focal deficit, CN grossly intact.  Discharge Instructions   Discharge Instructions    (HEART FAILURE PATIENTS) Call MD:  Anytime you have any of the following symptoms: 1) 3 pound weight gain in 24 hours or 5 pounds in 1 week 2) shortness of breath, with or without a dry hacking cough 3) swelling in the hands, feet or stomach 4) if you have to sleep on extra pillows at night in order to breathe.   Complete by:  As directed    Diet - low sodium heart healthy   Complete by:  As directed    Discharge instructions   Complete by:  As directed    Stop smoking Maintain adequate hydration  Follow low sodium diet (< 2.5 gram daily) Take medications as prescribed  Follow up with PCP in 10 days.     Allergies as of 06/17/2018      Reactions   Augmentin [amoxicillin-pot Clavulanate] Rash, Other (See Comments)   Has patient had a PCN reaction causing immediate rash, facial/tongue/throat swelling, SOB or lightheadedness with hypotension: No Has patient had a PCN reaction causing severe rash involving mucus membranes or skin necrosis: No Has patient had a PCN reaction that required hospitalization No Has patient had a PCN reaction occurring within the last 10 years: Yes If all of  the above answers are "NO", then may proceed with Cephalosporin use.   Ace Inhibitors Hives      Medication List    TAKE these medications   albuterol 108 (90 Base) MCG/ACT inhaler Commonly known as:  PROAIR HFA Inhale 2 puffs into the lungs every 4 (four) hours as needed for wheezing or shortness of breath.   albuterol (2.5 MG/3ML) 0.083% nebulizer solution Commonly known as:  PROVENTIL Take 3 mLs (2.5 mg total) by nebulization every 4 (four) hours as needed for shortness of breath.   aspirin EC 81 MG tablet Take 81 mg by mouth every evening.   atorvastatin 20 MG tablet Commonly known as:  LIPITOR Take 1 tablet (20 mg total) by mouth daily. For high cholesterol   budesonide-formoterol 160-4.5 MCG/ACT inhaler Commonly known as:  SYMBICORT Inhale 2 puffs into the lungs 2 (two) times daily.   divalproex 500 MG 24 hr tablet Commonly known as:  DEPAKOTE ER Take 3 tablets (1,500 mg total) by mouth at bedtime.   doxycycline 100 MG tablet Commonly known as:  VIBRA-TABS Take 1 tablet (100 mg total) by mouth every 12 (twelve) hours.   FLUoxetine 40 MG capsule Commonly known as:  PROZAC Take 1 capsule (40 mg total) by mouth daily. For mood control  furosemide 20 MG tablet Commonly known as:  LASIX Take 1 tablet (20 mg total) by mouth daily. Start taking on:  06/18/2018   gabapentin 400 MG capsule Commonly known as:  NEURONTIN Take 1 capsule (400 mg total) by mouth 3 (three) times daily.   hydrOXYzine 50 MG tablet Commonly known as:  ATARAX/VISTARIL Take 1 tablet (50 mg total) by mouth every 6 (six) hours as needed for anxiety (sleep).   ipratropium 0.02 % nebulizer solution Commonly known as:  ATROVENT Take 2.5 mLs (0.5 mg total) by nebulization 3 (three) times daily.   metoprolol tartrate 50 MG tablet Commonly known as:  LOPRESSOR Take 1 tablet (50 mg total) by mouth 2 (two) times daily.   nicotine 14 mg/24hr patch Commonly known as:  NICODERM CQ - dosed in mg/24  hours Place 1 patch (14 mg total) onto the skin daily.   OLANZapine 10 MG tablet Commonly known as:  ZYPREXA Take 1.5 tablets (15 mg total) by mouth at bedtime. For mood stability   pantoprazole 20 MG tablet Commonly known as:  PROTONIX Take 1 tablet (20 mg total) by mouth daily.   potassium chloride SA 20 MEQ tablet Commonly known as:  K-DUR,KLOR-CON Take 1 tablet (20 mEq total) by mouth daily. Start taking on:  06/18/2018   predniSONE 10 MG tablet Commonly known as:  DELTASONE Take 3 tablets by mouth daily X 1 day; then 2 tablets by mouth daily X 2 days; then 1 tablet by mouth daily X 2 days; then 1/2 tablet by mouth daily X 3 days and stop prednisone.   traZODone 50 MG tablet Commonly known as:  DESYREL Take 1 tablet (50 mg total) by mouth at bedtime as needed for sleep.      Allergies  Allergen Reactions  . Augmentin [Amoxicillin-Pot Clavulanate] Rash and Other (See Comments)    Has patient had a PCN reaction causing immediate rash, facial/tongue/throat swelling, SOB or lightheadedness with hypotension: No Has patient had a PCN reaction causing severe rash involving mucus membranes or skin necrosis: No Has patient had a PCN reaction that required hospitalization No Has patient had a PCN reaction occurring within the last 10 years: Yes If all of the above answers are "NO", then may proceed with Cephalosporin use.  Donivan Scull Inhibitors Hives   Follow-up Information    Gareth Morgan, MD. Schedule an appointment as soon as possible for a visit in 10 day(s).   Specialty:  Family Medicine Contact information: [address] [place] [address] [phone]            The results of significant diagnostics from this hospitalization (including imaging, microbiology, ancillary and laboratory) are listed below for reference.    Significant Diagnostic Studies: Dg Chest Port 1 View  Result Date: 06/16/2018 CLINICAL DATA:  Patient woke from sleep with shortness of  breath tonight. Productive cough. History of COPD, hypertension, pneumonia, and current smoker. EXAM: PORTABLE CHEST 1 VIEW COMPARISON:  12/21/2017 FINDINGS: Normal heart size. Perihilar and basilar infiltrates bilaterally may indicate edema or pneumonia. No pleural effusions. No pneumothorax. Mediastinal contours appear intact. No focal consolidation. IMPRESSION: Perihilar and basilar infiltrates bilaterally may indicate edema or pneumonia. Electronically Signed   By: Burman Nieves M.D.   On: 06/16/2018 02:06    Microbiology: No results found for this or any previous visit (from the past 240 hour(s)).   Labs: Basic Metabolic Panel: Recent Labs  Lab 06/16/18 0151 06/17/18 0534  NA 141 137  K 3.2* 4.3  CL 104 102  CO2 26 26  GLUCOSE 129* 155*  BUN 10 16  CREATININE 1.08 0.88  CALCIUM 9.0 9.4   Liver Function Tests: Recent Labs  Lab 06/16/18 0151 06/17/18 0534  AST 60* 33  ALT 46* 36  ALKPHOS 138* 104  BILITOT 0.7 0.6  PROT 6.9 6.5  ALBUMIN 3.7 3.4*   No results for input(s): LIPASE, AMYLASE in the last 168 hours. No results for input(s): AMMONIA in the last 168 hours. CBC: Recent Labs  Lab 06/16/18 0151 06/17/18 0534  WBC 8.0 15.9*  NEUTROABS 4.5  --   HGB 15.1 14.1  HCT 44.5 42.1  MCV 88.6 89.2  PLT 237 242   Cardiac Enzymes: Recent Labs  Lab 06/16/18 0151  TROPONINI <0.03   BNP: BNP (last 3 results) Recent Labs    06/16/18 0151  BNP 315.0*    ProBNP (last 3 results) No results for input(s): PROBNP in the last 8760 hours.  CBG: No results for input(s): GLUCAP in the last 168 hours.     Signed:  Vassie Loll MD.  [facility] 06/17/2018, 9:42 AM

## 2018-06-17 NOTE — Progress Notes (Signed)
Removed both IVs-clean, dry, intact. Reviewed d/c paperwork with patient and daughter. Reviewed new medications and where to pick up. Answered all questions. Stable patient walked to the car with his daughter.

## 2018-07-01 ENCOUNTER — Encounter: Payer: Self-pay | Admitting: Emergency Medicine

## 2018-07-01 ENCOUNTER — Ambulatory Visit (INDEPENDENT_AMBULATORY_CARE_PROVIDER_SITE_OTHER): Payer: Medicare Other | Admitting: Emergency Medicine

## 2018-07-01 DIAGNOSIS — J449 Chronic obstructive pulmonary disease, unspecified: Secondary | ICD-10-CM | POA: Diagnosis not present

## 2018-07-01 DIAGNOSIS — F17210 Nicotine dependence, cigarettes, uncomplicated: Secondary | ICD-10-CM

## 2018-07-01 DIAGNOSIS — I5033 Acute on chronic diastolic (congestive) heart failure: Secondary | ICD-10-CM | POA: Diagnosis not present

## 2018-07-01 DIAGNOSIS — F17200 Nicotine dependence, unspecified, uncomplicated: Secondary | ICD-10-CM

## 2018-07-01 MED ORDER — ALBUTEROL SULFATE HFA 108 (90 BASE) MCG/ACT IN AERS: 2.0000 | INHALATION_SPRAY | RESPIRATORY_TRACT | 5 refills | Status: DC | PRN

## 2018-07-01 NOTE — Assessment & Plan Note (Signed)
Recent hospitalization for COPD/CHF exacerbation with pulmonary infiltrates on Chest X-ray on 06/16/18. Stable after diuresis, nebulizer and prednisone treatment at hospital.   - Need repeat Chest-Xray in in couple weeks to see resolution of infiltrates

## 2018-07-01 NOTE — Assessment & Plan Note (Addendum)
>  50 pack year smoking history. Currently down to 1/2 pack a day. Enrolled in lung cancer screening program that showed Lung RADS 2 nodule in posterior left lower lobe   - Patient agrees to cut down to 1/3 pack a day by next visit - Repeat Chest CT in 12/2018

## 2018-07-01 NOTE — Patient Instructions (Addendum)
Mr. Janice NorrieFrench  Thank you for coming in  Please start using Stiolto 2 puffs once daily Stop your symbicort Remember to use your albuterol whenever you need We will give you prescription for handheld albuterol inhaler as well Also remember to reduce your tobacco use to 7 cigarettes a day instead of 10 as we agreed Continue to participate in your lung cancer screening program with a repeat CT scan next year in February of 2020

## 2018-07-01 NOTE — Assessment & Plan Note (Addendum)
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 Patient was prescribe ipratropium nebulizer solution in addition to his symbicort but he states he has not received it. He would benefit from additional maintenance control as he continued to endorse dyspnea with exertion.  - Stop symbicort - Start stiolto 2 puffs once daily - Proventil handheld inhaler as needed for rescue  - F/ u in 4 weeks

## 2018-07-01 NOTE — Progress Notes (Signed)
[name]    [id number]    1961-11-23  Primary Care Physician:Knowlton, Brett Canales, MD  Referring Physician: Gareth Morgan, MD [address] [place], [place] [address]  Chief complaint:  COPD, Tobacco use  HPI: Jeffrey [name], Jeffrey [name] is a 57 yo  M current smoker w/ PMH of COPD, diastolic CHF, CAD, and GERD here to establish care with Dr.Byrum. He had a recent hospitalization on 06/16/18 for shortness of breath diagnosed as exacerbation of his COPD and CHF with infiltrates Chest X-ray He was discharged after nebulizer treatments. Since then he has had no acute exacerbation. At baseline he endorse dyspnea on exertion of <1 block. Has daily productive brownish sputum associated with his tobacco use. Has a significant smoking history and cut down his current use but continuing to smoke. He states he uses his symbicort regularly without problems but he did not realize that he was suppose to use his nebulizer during exacerbations and he has not used his rescue inhaler in the last month. He has GERD well managed with protonix. He also has prior history of benign pulmonary nodule seen on chest CT followed with annual CT scans. He denies any F/N/V/D/C.  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 Currently cut down to 1/2 pack a day Travel history: None Relevant family history: None that he can remember  Allergies as of 07/01/2018 - Review Complete 07/01/2018  Allergen Reaction Noted  . Augmentin [amoxicillin-pot clavulanate] Rash and Other (See Comments) 12/16/2011  . Ace inhibitors Hives 05/05/2013    Past Medical History:  Diagnosis Date  . Acute blood loss anemia 02/07/2017  . Anxiety   . Arthritis    deg disease, bulging disk,  shoulder level  . Bipolar disorder (HCC)   . Bipolar disorder (HCC)   . 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 Depression     anxiety  . Hyperlipidemia   . Hypertension   . Peptic ulcer disease    Review  . Pneumonia   . [name] (peripheral vascular disease) (HCC) 06/18/2015  . Shortness of breath     Past Surgical History:  Procedure Laterality Date  . BIOPSY  02/09/2017   Procedure: BIOPSY;  Surgeon: [name], MD;  [facility];  Service: Endoscopy;;  duodenal gastric  . COLONOSCOPY  03/14/2007   [id number] colonoscopy and terminal ileoscopy except external hemorrhoids  . COLONOSCOPY N/A 05/05/2013   [name]: external/internal anal canal hemorrhoids, unable to intubate TI, segemental biopsies unremarkable   . COLONOSCOPY N/A 04/11/2017   Procedure: COLONOSCOPY;  Surgeon: [name], MD;  [facility];  Service: Endoscopy;  Laterality: N/A;  . COLONOSCOPY WITH PROPOFOL N/A 03/31/2017   Procedure: COLONOSCOPY WITH PROPOFOL;  Surgeon: [name], MD;  [facility];  Service: Endoscopy;  Laterality: N/A;  1:45pm  . ESOPHAGOGASTRODUODENOSCOPY  03/14/2007   [id number] antral gastritis with bulbar duodenitis/paucity to postbulbar duodenal folds and biopsy were benign with no evidence of villous atrophy.  . ESOPHAGOGASTRODUODENOSCOPY (EGD) WITH PROPOFOL N/A 02/09/2017   Procedure: ESOPHAGOGASTRODUODENOSCOPY (EGD) WITH PROPOFOL;  Surgeon: [name], MD;  [facility];  Service: Endoscopy;  Laterality: N/A;  . GIVENS CAPSULE STUDY N/A 04/08/2017   Procedure: GIVENS CAPSULE STUDY;  Surgeon: [name], MD;  [facility];  Service: Endoscopy;  Laterality: N/A;  .  HAND SURGERY     left, secondary to self-inflicted laceration  . HEMORRHOID SURGERY N/A 04/14/2017   Procedure: HEMORRHOIDECTOMY;  Surgeon: [name], MD;  Location: [facility];  Service: General;  Laterality: N/A;  . SHOULDER SURGERY     right  . TOE SURGERY     left great toe , amputated-lawnmover accident     Review of systems: Review of Systems  Constitutional: Negative  for fever and chills.  HENT: Negative.   Eyes: Negative for blurred vision.  Respiratory: as per HPI  Cardiovascular: Negative for chest pain and palpitations.  Gastrointestinal: Negative for vomiting, diarrhea, blood per rectum. Genitourinary: Negative for dysuria, urgency, frequency and hematuria.  All other systems reviewed and are negative.  Physical Exam: Blood pressure 134/62, pulse 75, height 5\' 9"  (1.753 m), weight 140 lb 3.2 oz (63.6 kg), SpO2 93 %. Gen:      Pleasant M in No acute distress HEENT:  EOMI, sclera anicteric Neck:     No masses; no thyromegaly Lungs:    Significant upper and lower expiratory wheezes and distant breath sounds. 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 Patient was prescribe ipratropium nebulizer solution in addition to his symbicort but he states he has not received it. He would benefit from additional maintenance control as he continued to endorse dyspnea with exertion.  - Stop symbicort - Start stiolto 2 puffs once daily - Proventil handheld inhaler as needed for rescue  - F/ u in 4 weeks  Tobacco use disorder >50 pack year smoking history. Currently down to 1/2 pack a day. Enrolled in lung cancer screening program that showed Lung RADS 2 nodule in posterior left lower lobe   - Patient agrees to cut down to 1/3 pack a day by next visit - Repeat Chest CT in 12/2018  Acute on chronic diastolic HF (heart failure) (HCC) Recent hospitalization for COPD/CHF exacerbation with pulmonary infiltrates on Chest X-ray on 06/16/18. Stable after diuresis, nebulizer and prednisone treatment at hospital.   - Need repeat Chest-Xray  in in couple weeks to see resolution of infiltrates    Jeffrey [name], PGY1 [facility] 07/01/2018, 11:41 AM  CC: Gareth Morgan, MD

## 2018-07-19 ENCOUNTER — Ambulatory Visit (INDEPENDENT_AMBULATORY_CARE_PROVIDER_SITE_OTHER): Payer: Medicare Other | Admitting: Psychiatry

## 2018-07-19 ENCOUNTER — Encounter (HOSPITAL_COMMUNITY): Payer: Self-pay | Admitting: Psychiatry

## 2018-07-19 VITALS — BP 125/75 | HR 92 | Ht 69.0 in | Wt 142.0 lb

## 2018-07-19 DIAGNOSIS — F3162 Bipolar disorder, current episode mixed, moderate: Secondary | ICD-10-CM

## 2018-07-19 DIAGNOSIS — F332 Major depressive disorder, recurrent severe without psychotic features: Secondary | ICD-10-CM

## 2018-07-19 MED ORDER — TRAZODONE HCL 50 MG PO TABS: 50.0000 mg | ORAL_TABLET | Freq: Every evening | ORAL | 2 refills | Status: DC | PRN

## 2018-07-19 MED ORDER — GABAPENTIN 400 MG PO CAPS: 400.0000 mg | ORAL_CAPSULE | Freq: Three times a day (TID) | ORAL | 2 refills | Status: DC

## 2018-07-19 MED ORDER — OLANZAPINE 10 MG PO TABS: 15.0000 mg | ORAL_TABLET | Freq: Every day | ORAL | 2 refills | Status: DC

## 2018-07-19 MED ORDER — FLUOXETINE HCL 40 MG PO CAPS: 40.0000 mg | ORAL_CAPSULE | Freq: Every day | ORAL | 2 refills | Status: DC

## 2018-07-19 NOTE — Progress Notes (Signed)
BH MD/PA/NP OP Progress Note  07/19/2018 9:25 AM Jeffrey [name]  MRN:  [id number]  Chief Complaint:  Chief Complaint    Depression; Anxiety; Follow-up     WGN:FAOZ patient is a 57 year old separated white male who is livingalone on his father's land near [place]. 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  The patient had been going to [facility] for the last couple of years but they no longer take his insurance and he was therefore referred here. His daughter states that he is not doing well in terms of mood and needs to be seen anyway.  The patient has a long history of depression that dates back to his early 21s. At that time he was married to his first wife and for a while it went well. However when they separated she did not allow him to see his son and this went on for a period of about 8 years. He became increasingly stressed and depressed during this time. He was hospitalized several times for suicide attempts and was in the [facility] twice in 2009. His daughter thinks his last hospital they shows approximately 5 years ago.  The patient married again and he has been married to his second wife for 17 years. She has 3 adult children and one of her sons is very violent. He is finally decided to leave his wife because her son has fought with him several times and he feels like they're trying to take away his land. Over the last month he has been staying with his daughter but is very stressed about the whole situation. He's not able to eat and has lost 40 pounds over last year. His mood is very low and sad. His memory is poor. He smokes marijuana intermittently to try to deal with the stress. He has no interest in anything other than his children and grandchildren. His energy is nonexistent and he cannot get himself out of bed. He sees Dr. Andrey [name] for primary care and was there recently and apparently there was nothing medically wrong but we do not have  these records. He has had a recent colonoscopy that was negative. He denies auditory or visual hallucinations and does not use alcohol or drugs other than marijuana  He has been on a combination of Depakote Paxil Navane and BuSpar for number of years. Nothing has been change in the last couple of years despite his continued downward slide  The patient returns after 6 weeks.  He is here with his ex-wife Jeffrey [name].  Even though they are not together they are like "best friends" and help take care of each other.  The patient was in the hospital at the end of July with a COPD exacerbation.  He is feeling a little better now and is still smoking.  He is trying to get down to 7 cigarettes a day as advised by his pulmonologist.  He still drinks about 1 beer a day as well and smokes marijuana.  I explained that the marijuana smoke is not any better than the cigarette smoke for his lungs.  Again cocaine and marijuana showed up in his drug screen when he entered the hospital last month.  He denies being seriously depressed suicidal or psychotic.  His appetite has been somewhat better since we increase the olanzapine.  Most of the time he sleeps well when he remembers to take his medicine.  He states his short-term memory is not that good. Visit Diagnosis:  ICD-10-CM   1. 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 Acute blood loss anemia 02/07/2017  . Anxiety   . Arthritis    deg disease, bulging disk,  shoulder level  . Bipolar disorder (HCC)   . Bipolar disorder (HCC)   . 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 Depression    anxiety  . Hyperlipidemia   . Hypertension   . Peptic ulcer disease    Review  . Pneumonia   .  [name] (peripheral vascular disease) (HCC) 06/18/2015  . Shortness of breath     Past Surgical History:  Procedure Laterality Date  . BIOPSY  02/09/2017   Procedure: BIOPSY;  Surgeon: [name], MD;  [facility];  Service: Endoscopy;;  duodenal gastric  . COLONOSCOPY  03/14/2007   [id number] colonoscopy and terminal ileoscopy except external hemorrhoids  . COLONOSCOPY N/A 05/05/2013   [name]: external/internal anal canal hemorrhoids, unable to intubate TI, segemental biopsies unremarkable   . COLONOSCOPY N/A 04/11/2017   Procedure: COLONOSCOPY;  Surgeon: [name], MD;  [facility];  Service: Endoscopy;  Laterality: N/A;  . COLONOSCOPY WITH PROPOFOL N/A 03/31/2017   Procedure: COLONOSCOPY WITH PROPOFOL;  Surgeon: [name], MD;  [facility];  Service: Endoscopy;  Laterality: N/A;  1:45pm  . ESOPHAGOGASTRODUODENOSCOPY  03/14/2007   [id number] antral gastritis with bulbar duodenitis/paucity to postbulbar duodenal folds and biopsy were benign with no evidence of villous atrophy.  . ESOPHAGOGASTRODUODENOSCOPY (EGD) WITH PROPOFOL N/A 02/09/2017   Procedure: ESOPHAGOGASTRODUODENOSCOPY (EGD) WITH PROPOFOL;  Surgeon: [name], MD;  [facility];  Service: Endoscopy;  Laterality: N/A;  . GIVENS CAPSULE STUDY N/A 04/08/2017   Procedure: GIVENS CAPSULE STUDY;  Surgeon: [name], MD;  [facility];  Service: Endoscopy;  Laterality: N/A;  . HAND SURGERY     left, secondary to self-inflicted laceration  . HEMORRHOID SURGERY N/A 04/14/2017   Procedure: HEMORRHOIDECTOMY;  Surgeon: [name], MD;  Location: [facility];  Service: General;  Laterality: N/A;  . SHOULDER SURGERY     right  . TOE SURGERY     left great toe , amputated-lawnmover accident    Family Psychiatric History: See below  Family History:  Family History  Problem Relation Age of Onset  . Breast cancer Mother        deceased  . Heart disease  Father   . Depression Daughter   . Anxiety disorder Daughter   . Anxiety disorder Son   . Depression Son   . Asthma Brother   . Heart attack Maternal Aunt   . Heart attack Maternal Uncle   . Heart attack Paternal Aunt   . Heart attack Paternal Uncle   . Heart attack Maternal Grandmother   . Heart attack Maternal Grandfather   . Emphysema Maternal Grandfather   . Heart attack Paternal Grandmother   . Heart attack Paternal Grandfather   . Colon cancer Neg Hx   . Liver disease Neg Hx     Social History:  Social History   Socioeconomic History  . Marital status: Divorced    Spouse name: Not on file  . Number of children: 2  . Years of education:  Not on file  . Highest education level: Not on file  Occupational History  . Occupation: Disabled  Social Needs  . Financial resource strain: Not on file  . Food insecurity:    Worry: Not on file    Inability: Not on file  . Transportation needs:    Medical: Not on file    Non-medical: Not on file  Tobacco Use  . Smoking status: Current Every Day Smoker    Packs/day: 1.00    Years: 40.00    Pack years: 40.00    Types: Cigarettes    Start date: 08/04/1971  . Smokeless tobacco: Never Used  . Tobacco comment: peak rate of 2.5ppd, 1/2ppd on 11/27/2016 -- 6 cigarettes / day 01/25/18  Substance and Sexual Activity  . Alcohol use: No    Alcohol/week: 0.0 standard drinks  . Drug use: Yes    Types: Marijuana    Comment: most days   . Sexual activity: Never  Lifestyle  . Physical activity:    Days per week: Not on file    Minutes per session: Not on file  . Stress: Not on file  Relationships  . Social connections:    Talks on phone: Not on file    Gets together: Not on file    Attends religious service: Not on file    Active member of club or organization: Not on file    Attends meetings of clubs or organizations: Not on file    Relationship status: Not on file  Other Topics Concern  . Not on file  Social History Narrative    Originally from [place]. Previously has lived in [facility] [place] 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 Questionable asbestos exposure. Does have significant exposure to fumes. No mold exposure. No bird exposure. No pets currently.     Allergies:  Allergies  Allergen Reactions  . Augmentin [Amoxicillin-Pot Clavulanate] Rash and Other (See Comments)    Has patient had a PCN reaction causing immediate rash, facial/tongue/throat swelling, SOB or lightheadedness with hypotension: No Has patient had a PCN reaction causing severe rash involving mucus membranes or skin necrosis: No Has patient had a PCN reaction that required hospitalization No Has patient had a PCN reaction occurring within the last 10 years: Yes If all of the above answers are "NO", then may proceed with Cephalosporin use.  . Ace Inhibitors Hives    Metabolic Disorder Labs: No results found for: HGBA1C, MPG No results found for: PROLACTIN No results found for: CHOL, TRIG, HDL, CHOLHDL, VLDL, LDLCALC   Therapeutic Level Labs: No results found for: LITHIUM Lab Results  Component Value Date   VALPROATE 14 (L) 01/30/2018   VALPROATE 68.1 05/24/2017   No components found for:  CBMZ  Current Medications: Current Outpatient Medications  Medication Sig Dispense Refill  . albuterol (PROAIR HFA) 108 (90 Base) MCG/ACT inhaler Inhale 2 puffs into the lungs every 4 (four) hours as needed for wheezing or shortness of breath. 1 Inhaler 5  . albuterol (PROVENTIL) (2.5 MG/3ML) 0.083% nebulizer solution Take 3 mLs (2.5 mg total) by nebulization every 4 (four) hours as needed for shortness of breath. 120 mL 3  . aspirin EC 81 MG tablet Take 81 mg by mouth every evening.     Marland Kitchen atorvastatin (LIPITOR) 20 MG tablet Take 1 tablet (20 mg total) by mouth daily. For high cholesterol 30 tablet 0  . budesonide-formoterol (SYMBICORT) 160-4.5 MCG/ACT inhaler Inhale  2 puffs into the lungs 2  (two) times daily. 1 Inhaler 3  . FLUoxetine (PROZAC) 40 MG capsule Take 1 capsule (40 mg total) by mouth daily. For mood control 30 capsule 2  . furosemide (LASIX) 20 MG tablet Take 1 tablet (20 mg total) by mouth daily. 30 tablet 1  . gabapentin (NEURONTIN) 400 MG capsule Take 1 capsule (400 mg total) by mouth 3 (three) times daily. 90 capsule 2  . ipratropium (ATROVENT) 0.02 % nebulizer solution Take 2.5 mLs (0.5 mg total) by nebulization 3 (three) times daily. 225 mL 11  . metoprolol tartrate (LOPRESSOR) 50 MG tablet Take 1 tablet (50 mg total) by mouth 2 (two) times daily. 60 tablet 0  . OLANZapine (ZYPREXA) 10 MG tablet Take 1.5 tablets (15 mg total) by mouth at bedtime. For mood stability 45 tablet 2  . pantoprazole (PROTONIX) 20 MG tablet Take 1 tablet (20 mg total) by mouth daily. 30 tablet 1  . potassium chloride SA (K-DUR,KLOR-CON) 20 MEQ tablet Take 1 tablet (20 mEq total) by mouth daily. 30 tablet 1  . traZODone (DESYREL) 50 MG tablet Take 1 tablet (50 mg total) by mouth at bedtime as needed for sleep. 30 tablet 2   No current facility-administered medications for this visit.      Musculoskeletal: Strength & Muscle Tone: within normal limits Gait & Station: normal Patient leans: N/A  Psychiatric Specialty Exam: Review of Systems  Constitutional: Positive for malaise/fatigue.  Respiratory: Positive for shortness of breath.   Musculoskeletal: Positive for back pain.  All other systems reviewed and are negative.   Blood pressure 125/75, pulse 92, height 5\' 9"  (1.753 m), weight 142 lb (64.4 kg), SpO2 93 %.Body mass index is 20.97 kg/m.  General Appearance: Casual and Disheveled  Eye Contact:  Good  Speech:  Clear and Coherent  Volume:  Decreased  Mood:  Anxious  Affect:  Constricted  Thought Process:  Goal Directed  Orientation:  Full (Time, Place, and Person)  Thought Content: Rumination   Suicidal Thoughts:  No  Homicidal Thoughts:  No  Memory:  Immediate;    Good Recent;   Good Remote;   Poor  Judgement:  Poor  Insight:  Lacking  Psychomotor Activity:  Decreased  Concentration:  Concentration: Fair and Attention Span: Fair  Recall:  [employer] of Knowledge: Fair  Language: Fair  Akathisia:  No  Handed:  Right  AIMS (if indicated): not done  Assets:  Communication Skills Desire for Improvement Resilience Social Support Talents/Skills  ADL's:  Intact  Cognition: WNL  Sleep:  Fair   Screenings: AIMS     [facility]  AIMS Total Score  0    AUDIT     [facility]  Alcohol Use Disorder Identification Test Final Score (AUDIT)  18       Assessment and Plan: This patient is a 57 year old male with a history of presumed bipolar disorder.  He consistently shows cocaine in his drug screen despite his denial.  I have urged him to quit these drugs because I am sure they are exacerbating his mood swings.  He is in counseling here and this needs to be addressed.  For now he will continue trazodone 50 mg at bedtime for sleep, olanzapine 15 mg at bedtime for mood stabilization gabapentin 400 mg 3 times daily for anxiety and Prozac 40 mg daily for depression.  He  will return to see me in 2 months   Diannia Rudereborah Kerriann Kamphuis, MD 07/19/2018, 9:25 AM

## 2018-07-20 ENCOUNTER — Encounter (HOSPITAL_COMMUNITY): Payer: Self-pay | Admitting: Licensed Clinical Social Worker

## 2018-07-20 ENCOUNTER — Ambulatory Visit (INDEPENDENT_AMBULATORY_CARE_PROVIDER_SITE_OTHER): Payer: Medicare Other | Admitting: Licensed Clinical Social Worker

## 2018-07-20 DIAGNOSIS — F3162 Bipolar disorder, current episode mixed, moderate: Secondary | ICD-10-CM | POA: Diagnosis not present

## 2018-07-20 NOTE — Progress Notes (Signed)
   THERAPIST PROGRESS NOTE  Session Time: 8:00 am-8:45 am  Participation Level: Active  Behavioral Response: CasualAlertDepressed  Type of Therapy: Individual Therapy  Treatment Goals addressed: Coping  Interventions: CBT and Solution Focused  Summary: Jeffrey [name] is a 57 y.o. male who presents oriented x5 (person, place, situation, time and object), casually dressed, appropriately groomed, average height, thin and cooperative to address mood. Patient has a history of medical treatment including COPD. Patient has a history of mental health treatment including medication management. Patient denies suicidal and homicidal ideations. Patient denies psychosis including auditory and visual hallucinations. Patient denies substance abuse. He is at low risk for lethality.    Physically: Patient is struggling with his COPD. He is having difficulty with breathing. Even small tasks make it hard for him. He also has chronic back pain.  Spiritually/values: No issues identified.  Relationships: Patient spends time with his father daily. He watches his father work which makes him feel bad at times because patient isn't able to work in the yard. Patient's children and grandchildren come to see him often which make him happy.  Emotionally/Mentally/Behavior: Patient's mood has been down. He feels bad because he can't work and do things like he used to. After discussion, patient understood that he needs to stay active and engaged in things he enjoys but not over do it. Patient identified that he would like to go fishing with a friend soon. He is going to plan this with his friend.   Patient engaged in session. He responded well to interventions. Patient continues to meet criteria for Moderate mixed bipolar I disorder. Patient will continue in outpatient therapy due to being the least restrictive service to meet his needs. Patient made minimal progress on his goals.   Suicidal/Homicidal: Negativewithout  intent/plan  Therapist Response: Therapist reviewed patient's recent thoughts and behaviors. Therapist utilized CBT to address mood. Therapist processed patient's feelings to identify triggers for mood. Therapist assisted patient in identify small steps he can take to stay active and improve mood.   Plan: Return again in 4-6 weeks.  Diagnosis: Axis I: Moderate mixed bipolar I disorder    Axis II: No diagnosis    Bynum BellowsJoshua Antia Rahal, LCSW 07/20/2018

## 2018-08-01 ENCOUNTER — Ambulatory Visit (INDEPENDENT_AMBULATORY_CARE_PROVIDER_SITE_OTHER): Payer: Medicare Other | Admitting: Emergency Medicine

## 2018-08-01 ENCOUNTER — Encounter: Payer: Self-pay | Admitting: Emergency Medicine

## 2018-08-01 DIAGNOSIS — Z23 Encounter for immunization: Secondary | ICD-10-CM

## 2018-08-01 DIAGNOSIS — J449 Chronic obstructive pulmonary disease, unspecified: Secondary | ICD-10-CM

## 2018-08-01 DIAGNOSIS — R42 Dizziness and giddiness: Secondary | ICD-10-CM | POA: Diagnosis not present

## 2018-08-01 DIAGNOSIS — F17200 Nicotine dependence, unspecified, uncomplicated: Secondary | ICD-10-CM | POA: Diagnosis not present

## 2018-08-01 NOTE — Assessment & Plan Note (Signed)
I asked him to revisit this symptom with his primary care physician.

## 2018-08-01 NOTE — Progress Notes (Signed)
   Subjective:    Patient ID: Jeffrey [name], male    DOB: Oct 02, 1961, 57 y.o.   MRN: [id number]  HPI 57 year old smoker with COPD, hypertension, diastolic dysfunction.  He has severe obstruction by pulmonary function testing, FEV1 50% predicted.  He was seen here 1 month ago and we tried changing his Symbicort to Stiolto to see if he would get more benefit.  He reports that his breathing has improved since we made the change. Better exertional tolerance. He has poor appetite, he been having some dizziness for the last month. He is using albuterol about once daily. Frequent cough, prod white / clear. He has albuterol / atrovent. He is smoking about 8-10 cig a day. We had agreed to try to get to 7.   He is been participating in the low-dose CT scan screening program and has stable nodules.  His next scan is scheduled for 12/2018.   Review of Systems As above     Objective:   Physical Exam Vitals:   08/01/18 1025  BP: (!) 144/76  Pulse: 96  SpO2: 95%  Weight: 143 lb (64.9 kg)  Height: 5\' 9"  (1.753 m)   Gen: Pleasant, thin, in no distress,  normal affect  ENT: No lesions,  mouth clear,  oropharynx clear, no postnasal drip  Neck: No JVD, no stridor  Lungs: No use of accessory muscles, scattered bilateral soft expiratory wheezes  Cardiovascular: RRR, heart sounds normal, no murmur or gallops, no peripheral edema  Musculoskeletal: No deformities, no cyanosis or clubbing  Neuro: alert, non focal  Skin: Warm, no lesions or rash      Assessment & Plan:  COPD, severe (HCC) He did benefit from the [employer].  We will try to continue this.  I discussed smoking with him both tobacco and also marijuana.  He is going to try and cut down both.  We talked a little bit about starting a mucolytic given his thick secretions.  For now we will hold off.  Continue albuterol as needed.  I asked him to stop Atrovent nebulizers given his dry mouth and since he is on the Stiolto.  He has had the  Pneumovax, needs the Prevnar-13.  He is also due for a flu shot.    Tobacco use disorder Discussed cessation in detail with him today.  We are planning to try to cut down to 7 cigarettes daily, also decrease his marijuana use.  Surveillance low-dose CT scan of the chest in February 2020  Dizziness I asked him to revisit this symptom with his primary care physician.  Levy Pupa, MD, PhD 08/01/2018, 11:07 AM [facility] [phone] or if no answer [phone]

## 2018-08-01 NOTE — Assessment & Plan Note (Addendum)
Discussed cessation in detail with him today.  We are planning to try to cut down to 7 cigarettes daily, also decrease his marijuana use.  Surveillance low-dose CT scan of the chest in February 2020

## 2018-08-01 NOTE — Addendum Note (Signed)
Addended by: Jaynee Eagles C on: 08/01/2018 11:10 AM   Modules accepted: Orders

## 2018-08-01 NOTE — Patient Instructions (Addendum)
We will plan to continue Stiolto 2 puffs once daily. Rinse and gargle after using this medication. You may continue to use albuterol 2 puffs or 1 nebulizer treatment up to every 4 hours if needed for shortness of breath, chest tightness, wheezing. Please do not use Atrovent (ipratropium) nebulizers anymore if you still have them. Work hard on decreasing your smoking.  We agreed to try to get down to 7 cigarettes daily by her next visit. Flu and Prevnar 13 pneumonia shots today. Your Pneumovax shot is up-to-date.   Get your lung cancer screening CT scan in February 2020 as planned. Agree with seeing your PCP regarding your dizziness.  Follow with Dr Delton Coombes in 6 months or sooner if you have any problems

## 2018-08-01 NOTE — Assessment & Plan Note (Signed)
He did benefit from the [employer].  We will try to continue this.  I discussed smoking with him both tobacco and also marijuana.  He is going to try and cut down both.  We talked a little bit about starting a mucolytic given his thick secretions.  For now we will hold off.  Continue albuterol as needed.  I asked him to stop Atrovent nebulizers given his dry mouth and since he is on the Stiolto.  He has had the Pneumovax, needs the Prevnar-13.  He is also due for a flu shot.

## 2018-09-16 ENCOUNTER — Observation Stay (HOSPITAL_COMMUNITY)
Admission: EM | Admit: 2018-09-16 | Discharge: 2018-09-17 | Disposition: A | Discharge status: Home / Self Care | Payer: Medicare Other | Attending: Emergency Medicine | Admitting: Emergency Medicine

## 2018-09-16 ENCOUNTER — Emergency Department (HOSPITAL_COMMUNITY): Payer: Medicare Other

## 2018-09-16 ENCOUNTER — Other Ambulatory Visit: Payer: Self-pay

## 2018-09-16 ENCOUNTER — Encounter (HOSPITAL_COMMUNITY): Payer: Self-pay | Admitting: Emergency Medicine

## 2018-09-16 DIAGNOSIS — I110 Hypertensive heart disease with heart failure: Secondary | ICD-10-CM | POA: Diagnosis not present

## 2018-09-16 DIAGNOSIS — I10 Essential (primary) hypertension: Secondary | ICD-10-CM

## 2018-09-16 DIAGNOSIS — R0602 Shortness of breath: Secondary | ICD-10-CM | POA: Diagnosis present

## 2018-09-16 DIAGNOSIS — F329 Major depressive disorder, single episode, unspecified: Secondary | ICD-10-CM | POA: Diagnosis not present

## 2018-09-16 DIAGNOSIS — F17210 Nicotine dependence, cigarettes, uncomplicated: Secondary | ICD-10-CM | POA: Diagnosis not present

## 2018-09-16 DIAGNOSIS — I5033 Acute on chronic diastolic (congestive) heart failure: Secondary | ICD-10-CM | POA: Diagnosis not present

## 2018-09-16 DIAGNOSIS — Z8659 Personal history of other mental and behavioral disorders: Secondary | ICD-10-CM

## 2018-09-16 DIAGNOSIS — J441 Chronic obstructive pulmonary disease with (acute) exacerbation: Secondary | ICD-10-CM | POA: Diagnosis not present

## 2018-09-16 DIAGNOSIS — K219 Gastro-esophageal reflux disease without esophagitis: Secondary | ICD-10-CM | POA: Diagnosis not present

## 2018-09-16 DIAGNOSIS — E785 Hyperlipidemia, unspecified: Secondary | ICD-10-CM | POA: Insufficient documentation

## 2018-09-16 DIAGNOSIS — Z7982 Long term (current) use of aspirin: Secondary | ICD-10-CM | POA: Insufficient documentation

## 2018-09-16 DIAGNOSIS — Z79899 Other long term (current) drug therapy: Secondary | ICD-10-CM | POA: Insufficient documentation

## 2018-09-16 DIAGNOSIS — F17200 Nicotine dependence, unspecified, uncomplicated: Secondary | ICD-10-CM | POA: Diagnosis present

## 2018-09-16 LAB — COMPREHENSIVE METABOLIC PANEL
ALT: 48 U/L — ABNORMAL HIGH (ref 0–44)
AST: 58 U/L — ABNORMAL HIGH (ref 15–41)
Albumin: 3.8 g/dL (ref 3.5–5.0)
Alkaline Phosphatase: 152 U/L — ABNORMAL HIGH (ref 38–126)
Anion gap: 7 (ref 5–15)
BUN: 8 mg/dL (ref 6–20)
CO2: 27 mmol/L (ref 22–32)
Calcium: 9 mg/dL (ref 8.9–10.3)
Chloride: 105 mmol/L (ref 98–111)
Creatinine, Ser: 0.8 mg/dL (ref 0.61–1.24)
GFR calc Af Amer: >60 mL/min (ref >60)
GFR calc non Af Amer: >60 mL/min (ref >60)
Glucose, Bld: 106 mg/dL — ABNORMAL HIGH (ref 70–99)
Potassium: 3.9 mmol/L (ref 3.5–5.1)
Sodium: 139 mmol/L (ref 135–145)
Total Bilirubin: 0.8 mg/dL (ref 0.3–1.2)
Total Protein: 6.9 g/dL (ref 6.5–8.1)

## 2018-09-16 LAB — CBC WITH DIFFERENTIAL/PLATELET
Abs Immature Granulocytes: 0.03 10*3/uL (ref 0.00–0.07)
Basophils Absolute: 0.1 10*3/uL (ref 0.0–0.1)
Basophils Relative: 1 %
Eosinophils Absolute: 0.1 10*3/uL (ref 0.0–0.5)
Eosinophils Relative: 1 %
HCT: 44.9 % (ref 39.0–52.0)
Hemoglobin: 14.3 g/dL (ref 13.0–17.0)
Immature Granulocytes: 0 %
Lymphocytes Relative: 9 %
Lymphs Abs: 0.8 10*3/uL (ref 0.7–4.0)
MCH: 28.3 pg (ref 26.0–34.0)
MCHC: 31.8 g/dL (ref 30.0–36.0)
MCV: 88.9 fL (ref 80.0–100.0)
Monocytes Absolute: 0.5 10*3/uL (ref 0.1–1.0)
Monocytes Relative: 6 %
Neutro Abs: 7.1 10*3/uL (ref 1.7–7.7)
Neutrophils Relative %: 83 %
Platelets: 234 10*3/uL (ref 150–400)
RBC: 5.05 MIL/uL (ref 4.22–5.81)
RDW: 12.9 % (ref 11.5–15.5)
WBC: 8.6 10*3/uL (ref 4.0–10.5)
nRBC: 0 % (ref 0.0–0.2)

## 2018-09-16 LAB — BRAIN NATRIURETIC PEPTIDE: B Natriuretic Peptide: 407 pg/mL — ABNORMAL HIGH (ref 0.0–100.0)

## 2018-09-16 LAB — TROPONIN I: Troponin I: <0.03 ng/mL (ref <0.03)

## 2018-09-16 MED ORDER — POTASSIUM CHLORIDE CRYS ER 20 MEQ PO TBCR
20.0000 meq | EXTENDED_RELEASE_TABLET | Freq: Every day | ORAL | Status: DC
  Administered 2018-09-16 – 2018-09-17 (×2): 20 meq via ORAL
  Filled 2018-09-16 (×2): qty 1

## 2018-09-16 MED ORDER — PANTOPRAZOLE SODIUM 20 MG PO TBEC: 20.0000 mg | DELAYED_RELEASE_TABLET | Freq: Every day | ORAL | Status: DC

## 2018-09-16 MED ORDER — FUROSEMIDE 40 MG PO TABS
20.0000 mg | ORAL_TABLET | Freq: Every day | ORAL | Status: DC
  Administered 2018-09-16: 20 mg via ORAL
  Filled 2018-09-16 (×2): qty 1

## 2018-09-16 MED ORDER — METHYLPREDNISOLONE SODIUM SUCC 40 MG IJ SOLR
40.0000 mg | Freq: Four times a day (QID) | INTRAMUSCULAR | Status: DC
  Administered 2018-09-16 – 2018-09-17 (×3): 40 mg via INTRAVENOUS
  Filled 2018-09-16 (×3): qty 1

## 2018-09-16 MED ORDER — SODIUM CHLORIDE 0.9% FLUSH
3.0000 mL | Freq: Two times a day (BID) | INTRAVENOUS | Status: DC
  Administered 2018-09-16: 3 mL via INTRAVENOUS

## 2018-09-16 MED ORDER — IPRATROPIUM-ALBUTEROL 0.5-2.5 (3) MG/3ML IN SOLN
3.0000 mL | Freq: Once | RESPIRATORY_TRACT | Status: AC
  Administered 2018-09-16: 3 mL via RESPIRATORY_TRACT
  Filled 2018-09-16: qty 3

## 2018-09-16 MED ORDER — OLANZAPINE 5 MG PO TABS: 15.0000 mg | ORAL_TABLET | Freq: Every day | ORAL | Status: DC

## 2018-09-16 MED ORDER — ENOXAPARIN SODIUM 40 MG/0.4ML ~~LOC~~ SOLN
40.0000 mg | SUBCUTANEOUS | Status: DC
  Administered 2018-09-16: 40 mg via SUBCUTANEOUS
  Filled 2018-09-16: qty 0.4

## 2018-09-16 MED ORDER — METHYLPREDNISOLONE SODIUM SUCC 125 MG IJ SOLR
125.0000 mg | Freq: Once | INTRAMUSCULAR | Status: AC
  Administered 2018-09-16: 125 mg via INTRAVENOUS
  Filled 2018-09-16: qty 2

## 2018-09-16 MED ORDER — ATORVASTATIN CALCIUM 10 MG PO TABS
20.0000 mg | ORAL_TABLET | Freq: Every day | ORAL | Status: DC
  Administered 2018-09-16 – 2018-09-17 (×2): 20 mg via ORAL
  Filled 2018-09-16 (×2): qty 2

## 2018-09-16 MED ORDER — PANTOPRAZOLE SODIUM 40 MG PO TBEC
40.0000 mg | DELAYED_RELEASE_TABLET | Freq: Every day | ORAL | Status: DC
  Administered 2018-09-17: 40 mg via ORAL
  Filled 2018-09-16: qty 1

## 2018-09-16 MED ORDER — FLUOXETINE HCL 20 MG PO CAPS
40.0000 mg | ORAL_CAPSULE | Freq: Every day | ORAL | Status: DC
  Administered 2018-09-16 – 2018-09-17 (×2): 40 mg via ORAL
  Filled 2018-09-16 (×2): qty 2

## 2018-09-16 MED ORDER — IPRATROPIUM-ALBUTEROL 0.5-2.5 (3) MG/3ML IN SOLN
3.0000 mL | Freq: Four times a day (QID) | RESPIRATORY_TRACT | Status: DC
  Administered 2018-09-16: 3 mL via RESPIRATORY_TRACT
  Filled 2018-09-16: qty 3

## 2018-09-16 MED ORDER — ACETAMINOPHEN 325 MG PO TABS: 650.0000 mg | ORAL_TABLET | Freq: Four times a day (QID) | ORAL | Status: DC | PRN

## 2018-09-16 MED ORDER — SODIUM CHLORIDE 0.9% FLUSH: 3.0000 mL | INTRAVENOUS | Status: DC | PRN

## 2018-09-16 MED ORDER — PREDNISONE 20 MG PO TABS: 40.0000 mg | ORAL_TABLET | Freq: Every day | ORAL | Status: DC

## 2018-09-16 MED ORDER — GABAPENTIN 400 MG PO CAPS
400.0000 mg | ORAL_CAPSULE | Freq: Three times a day (TID) | ORAL | Status: DC
  Administered 2018-09-16 – 2018-09-17 (×3): 400 mg via ORAL
  Filled 2018-09-16 (×3): qty 1

## 2018-09-16 MED ORDER — ASPIRIN EC 81 MG PO TBEC: 81.0000 mg | DELAYED_RELEASE_TABLET | Freq: Every evening | ORAL | Status: DC

## 2018-09-16 MED ORDER — FUROSEMIDE 40 MG PO TABS
40.0000 mg | ORAL_TABLET | Freq: Once | ORAL | Status: AC
  Administered 2018-09-16: 40 mg via ORAL
  Filled 2018-09-16: qty 1

## 2018-09-16 MED ORDER — ONDANSETRON HCL 4 MG PO TABS: 4.0000 mg | ORAL_TABLET | Freq: Four times a day (QID) | ORAL | Status: DC | PRN

## 2018-09-16 MED ORDER — TRAZODONE HCL 50 MG PO TABS
50.0000 mg | ORAL_TABLET | Freq: Every evening | ORAL | Status: DC | PRN
  Administered 2018-09-16: 50 mg via ORAL
  Filled 2018-09-16: qty 1

## 2018-09-16 MED ORDER — MOMETASONE FURO-FORMOTEROL FUM 200-5 MCG/ACT IN AERO
2.0000 | INHALATION_SPRAY | Freq: Two times a day (BID) | RESPIRATORY_TRACT | Status: DC
  Administered 2018-09-16 – 2018-09-17 (×2): 2 via RESPIRATORY_TRACT
  Filled 2018-09-16: qty 8.8

## 2018-09-16 MED ORDER — ONDANSETRON HCL 4 MG/2ML IJ SOLN: 4.0000 mg | Freq: Four times a day (QID) | INTRAMUSCULAR | Status: DC | PRN

## 2018-09-16 MED ORDER — ALBUTEROL SULFATE (2.5 MG/3ML) 0.083% IN NEBU
2.5000 mg | INHALATION_SOLUTION | Freq: Once | RESPIRATORY_TRACT | Status: AC
  Administered 2018-09-16: 2.5 mg via RESPIRATORY_TRACT
  Filled 2018-09-16: qty 3

## 2018-09-16 MED ORDER — METOPROLOL TARTRATE 50 MG PO TABS
50.0000 mg | ORAL_TABLET | Freq: Two times a day (BID) | ORAL | Status: DC
  Administered 2018-09-16 – 2018-09-17 (×2): 50 mg via ORAL
  Filled 2018-09-16 (×2): qty 1

## 2018-09-16 MED ORDER — SODIUM CHLORIDE 0.9 % IV SOLN: 250.0000 mL | INTRAVENOUS | Status: DC | PRN

## 2018-09-16 MED ORDER — ALBUTEROL (5 MG/ML) CONTINUOUS INHALATION SOLN
7.5000 mg/h | INHALATION_SOLUTION | Freq: Once | RESPIRATORY_TRACT | Status: AC
  Administered 2018-09-16: 7.5 mg/h via RESPIRATORY_TRACT
  Filled 2018-09-16: qty 20

## 2018-09-16 MED ORDER — ACETAMINOPHEN 650 MG RE SUPP: 650.0000 mg | Freq: Four times a day (QID) | RECTAL | Status: DC | PRN

## 2018-09-16 NOTE — H&P (Signed)
History and Physical    Jeffrey [name] JWJ:[id number] DOB: 06/26/1961 DOA: 09/16/2018  PCP: Gareth Morgan, MD   Patient coming from: Home  Chief Complaint: Dyspnea  HPI: Jeffrey [name] is a 57 y.o. male with medical history significant for COPD with ongoing tobacco abuse, depression/anxiety, bipolar disorder, dyslipidemia, hypertension, and GERD who presents to the emergency department today with worsening cough productive of whitish sputum over the last several days as well as shortness of breath that began yesterday.  He states that his breathing is exacerbated with ambulation and improves somewhat with rest.  He has some chest tightness.  He denies any fevers, chills, lower extremity edema, orthopnea, chest pain, or other symptomatology.  He has been using his home nebulizers without significant relief.   ED Course: Vital signs are stable and patient is currently on room air.  He was ambulated on room air and desaturated to 88% and had significant shortness of breath noted.  His laboratory data is otherwise on remarkable aside from a BNP of 407 and 2 view chest x-ray demonstrates some peripheral vascular congestion and emphysema.  EKG was sinus rhythm at 72 bpm.  He was given a nebulizer treatment, Solu-Medrol, and Lasix and states that he is feeling much better at this time.  Review of Systems: All others reviewed and otherwise negative.  Past Medical History:  Diagnosis Date  . Acute blood loss anemia 02/07/2017  . Anxiety   . Arthritis    deg disease, bulging disk,  shoulder level  . Bipolar disorder (HCC)   . Bipolar disorder (HCC)   . 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 Depression    anxiety  . Hyperlipidemia   . Hypertension   . Peptic ulcer disease    Review  . Pneumonia   . [name] (peripheral vascular disease) (HCC) 06/18/2015  . Shortness of breath     Past Surgical History:  Procedure Laterality Date  . BIOPSY  02/09/2017   Procedure: BIOPSY;  Surgeon: [name],  MD;  Location: [facility];  Service: Endoscopy;;  duodenal gastric  . COLONOSCOPY  03/14/2007   AOZ:HYQMVH colonoscopy and terminal ileoscopy except external hemorrhoids  . COLONOSCOPY N/A 05/05/2013   [name]: external/internal anal canal hemorrhoids, unable to intubate TI, segemental biopsies unremarkable   . COLONOSCOPY N/A 04/11/2017   Procedure: COLONOSCOPY;  Surgeon: [name], MD;  Location: [facility];  Service: Endoscopy;  Laterality: N/A;  . COLONOSCOPY WITH PROPOFOL N/A 03/31/2017   Procedure: COLONOSCOPY WITH PROPOFOL;  Surgeon: [name], MD;  Location: [facility];  Service: Endoscopy;  Laterality: N/A;  1:45pm  . ESOPHAGOGASTRODUODENOSCOPY  03/14/2007   QIO:[id number] antral gastritis with bulbar duodenitis/paucity to postbulbar duodenal folds and biopsy were benign with no evidence of villous atrophy.  . ESOPHAGOGASTRODUODENOSCOPY (EGD) WITH PROPOFOL N/A 02/09/2017   Procedure: ESOPHAGOGASTRODUODENOSCOPY (EGD) WITH PROPOFOL;  Surgeon: [name], MD;  Location: [facility];  Service: Endoscopy;  Laterality: N/A;  . GIVENS CAPSULE STUDY N/A 04/08/2017   Procedure: GIVENS CAPSULE STUDY;  Surgeon: [name], MD;  Location: [facility];  Service: Endoscopy;  Laterality: N/A;  . HAND SURGERY     left, secondary to self-inflicted laceration  . HEMORRHOID SURGERY N/A 04/14/2017   Procedure: HEMORRHOIDECTOMY;  Surgeon: [name], MD;  Location: [facility];  Service: General;  Laterality: N/A;  . SHOULDER SURGERY     right  . TOE SURGERY     left great toe , amputated-lawnmover  accident     reports that he has been smoking cigarettes. He started smoking about 47 years ago. He has a 40.00 pack-year smoking history. He has never used smokeless tobacco. He reports that he has current or past drug history. Drug: Marijuana. He reports that he does not drink alcohol.  Allergies  Allergen Reactions  . Augmentin [Amoxicillin-Pot Clavulanate] Rash and Other  (See Comments)    Has patient had a PCN reaction causing immediate rash, facial/tongue/throat swelling, SOB or lightheadedness with hypotension: No Has patient had a PCN reaction causing severe rash involving mucus membranes or skin necrosis: No Has patient had a PCN reaction that required hospitalization No Has patient had a PCN reaction occurring within the last 10 years: Yes If all of the above answers are "NO", then may proceed with Cephalosporin use.  Donivan Scull Inhibitors Hives    Family History  Problem Relation Age of Onset  . Breast cancer Mother        deceased  . Heart disease Father   . Depression Daughter   . Anxiety disorder Daughter   . Anxiety disorder Son   . Depression Son   . Asthma Brother   . Heart attack Maternal Aunt   . Heart attack Maternal Uncle   . Heart attack Paternal Aunt   . Heart attack Paternal Uncle   . Heart attack Maternal Grandmother   . Heart attack Maternal Grandfather   . Emphysema Maternal Grandfather   . Heart attack Paternal Grandmother   . Heart attack Paternal Grandfather   . Colon cancer Neg Hx   . Liver disease Neg Hx     Prior to Admission medications   Medication Sig Start Date End Date Taking? Authorizing Provider  albuterol (PROAIR HFA) 108 (90 Base) MCG/ACT inhaler Inhale 2 puffs into the lungs every 4 (four) hours as needed for wheezing or shortness of breath. 07/01/18  Yes Leslye Peer, MD  albuterol (PROVENTIL) (2.5 MG/3ML) 0.083% nebulizer solution Take 3 mLs (2.5 mg total) by nebulization every 4 (four) hours as needed for shortness of breath. 02/02/17  Yes Roslynn Amble, MD  aspirin EC 81 MG tablet Take 81 mg by mouth every evening.    Yes [provider]  atorvastatin (LIPITOR) 20 MG tablet Take 1 tablet (20 mg total) by mouth daily. For high cholesterol 02/09/18  Yes Money, Gerlene Burdock, FNP  budesonide-formoterol (SYMBICORT) 160-4.5 MCG/ACT inhaler Inhale 2 puffs into the lungs 2 (two) times daily.   Yes [provider]  FLUoxetine (PROZAC) 40 MG capsule Take 1 capsule (40 mg total) by mouth daily. For mood control 07/19/18  Yes Myrlene Broker, MD  furosemide (LASIX) 20 MG tablet Take 1 tablet (20 mg total) by mouth daily. 06/18/18  Yes Vassie Loll, MD  gabapentin (NEURONTIN) 400 MG capsule Take 1 capsule (400 mg total) by mouth 3 (three) times daily. 07/19/18  Yes Myrlene Broker, MD  metoprolol tartrate (LOPRESSOR) 50 MG tablet Take 1 tablet (50 mg total) by mouth 2 (two) times daily. 02/08/18  Yes Money, Gerlene Burdock, FNP  OLANZapine (ZYPREXA) 10 MG tablet Take 1.5 tablets (15 mg total) by mouth at bedtime. For mood stability 07/19/18  Yes Myrlene Broker, MD  pantoprazole (PROTONIX) 20 MG tablet Take 1 tablet (20 mg total) by mouth daily. 06/17/18 06/17/19 Yes Vassie Loll, MD  potassium chloride SA (K-DUR,KLOR-CON) 20 MEQ tablet Take 1 tablet (20 mEq total) by mouth daily. 06/18/18  Yes Vassie Loll, MD  traZODone (DESYREL) 50 MG tablet Take 1 tablet (50 mg total) by mouth at bedtime as needed for sleep. 07/19/18  Yes Myrlene Broker, MD  ipratropium (ATROVENT) 0.02 % nebulizer solution Take 2.5 mLs (0.5 mg total) by nebulization 3 (three) times daily. Patient not taking: Reported on 09/16/2018 01/25/18   Julio Sicks, NP    Physical Exam: Vitals:   09/16/18 1130 09/16/18 1136 09/16/18 1236 09/16/18 1249  BP: 118/84  (!) 154/70   Pulse: 61     Resp: (!) 32  (!) 30   Temp:      TempSrc:      SpO2:  93%  94%  Weight:      Height:        Constitutional: NAD, calm, comfortable Vitals:   09/16/18 1130 09/16/18 1136 09/16/18 1236 09/16/18 1249  BP: 118/84  (!) 154/70   Pulse: 61     Resp: (!) 32  (!) 30   Temp:      TempSrc:      SpO2:  93%  94%  Weight:      Height:       Eyes: lids and conjunctivae normal ENMT: Mucous membranes are moist.  Neck: normal, supple Respiratory: clear to auscultation bilaterally. Normal respiratory effort. No accessory muscle use.  Cardiovascular:  Regular rate and rhythm, no murmurs. No extremity edema. Abdomen: no tenderness, no distention. Bowel sounds positive.  Musculoskeletal:  No joint deformity upper and lower extremities.   Skin: no rashes, lesions, ulcers.  Psychiatric: Normal judgment and insight. Alert and oriented x 3. Normal mood.   Labs on Admission: I have personally reviewed following labs and imaging studies  CBC: Recent Labs  Lab 09/16/18 1049  WBC 8.6  NEUTROABS 7.1  HGB 14.3  HCT 44.9  MCV 88.9  PLT 234   Basic Metabolic Panel: Recent Labs  Lab 09/16/18 1049  NA 139  K 3.9  CL 105  CO2 27  GLUCOSE 106*  BUN 8  CREATININE 0.80  CALCIUM 9.0   GFR: Estimated Creatinine Clearance: 96.8 mL/min (by C-G formula based on SCr of 0.8 mg/dL). Liver Function Tests: Recent Labs  Lab 09/16/18 1049  AST 58*  ALT 48*  ALKPHOS 152*  BILITOT 0.8  PROT 6.9  ALBUMIN 3.8   No results for input(s): LIPASE, AMYLASE in the last 168 hours. No results for input(s): AMMONIA in the last 168 hours. Coagulation Profile: No results for input(s): INR, PROTIME in the last 168 hours. Cardiac Enzymes: Recent Labs  Lab 09/16/18 1049  TROPONINI <0.03   BNP (last 3 results) No results for input(s): PROBNP in the last 8760 hours. HbA1C: No results for input(s): HGBA1C in the last 72 hours. CBG: No results for input(s): GLUCAP in the last 168 hours. Lipid Profile: No results for input(s): CHOL, HDL, LDLCALC, TRIG, CHOLHDL, LDLDIRECT in the last 72 hours. Thyroid Function Tests: No results for input(s): TSH, T4TOTAL, FREET4, T3FREE, THYROIDAB in the last 72 hours. Anemia Panel: No results for input(s): VITAMINB12, FOLATE, FERRITIN, TIBC, IRON, RETICCTPCT in the last 72 hours. Urine analysis:    Component Value Date/Time   COLORURINE YELLOW 06/16/2018 0156   APPEARANCEUR CLEAR 06/16/2018 0156   LABSPEC 1.012 06/16/2018 0156   PHURINE 6.0 06/16/2018 0156   GLUCOSEU NEGATIVE 06/16/2018 0156   HGBUR NEGATIVE  06/16/2018 0156   BILIRUBINUR NEGATIVE 06/16/2018 0156   KETONESUR NEGATIVE 06/16/2018 0156   PROTEINUR NEGATIVE 06/16/2018 0156   UROBILINOGEN 0.2 03/01/2011 1427   NITRITE NEGATIVE  06/16/2018 0156   LEUKOCYTESUR NEGATIVE 06/16/2018 0156    Radiological Exams on Admission: Dg Chest 2 View  Result Date: 09/16/2018 CLINICAL DATA:  Progressive acute shortness of breath. EXAM: CHEST - 2 VIEW COMPARISON:  Chest x-rays dated 06/16/2018 and 12/21/2017 and chest CT dated 01/04/2018 FINDINGS: There is new slight pulmonary vascular congestion. Heart size is normal. No infiltrates. No effusions. The lungs are hyperinflated consistent with emphysema demonstrated on the prior chest CT. Chronic or recurrent peribronchial thickening. No acute bone abnormality. Previous resection of the distal right clavicle. IMPRESSION: New pulmonary vascular congestion. Emphysema. Chronic or recurrent bronchitic changes. Electronically Signed   By: Francene Boyers M.D.   On: 09/16/2018 11:44    EKG: Independently reviewed. SR 72bpm.  Assessment/Plan Principal Problem:   COPD exacerbation (HCC) Active Problems:   H/O: depression   Dyslipidemia   Tobacco use disorder   Essential hypertension   GERD (gastroesophageal reflux disease)    1. Mild acute COPD exacerbation.  Patient has had a good response in the emergency department to interventions offered, but continues to have some ambulatory desaturations and some ongoing chest tightness.  Will admit for IV Solu-Medrol and breathing treatments overnight and anticipate discharge within the next 24 hours.  Continue home Lasix dose as otherwise noted. 2. Dyslipidemia.  Continue statin. 3. Hypertension.  Maintain on metoprolol and continue to observe.  Blood pressure well controlled at this time. 4. Depression/anxiety.  Maintain on Prozac, Zyprexa, and trazodone. 5. GERD.  Maintain on PPI.   DVT prophylaxis: Lovenox Code Status: Full Family Communication: None at  bedside Disposition Plan:COPD treatment with anticipated DC in 24 hours Consults called:None Admission status: Observation, MedSurg   Pratik Hoover Brunette DO [facility] Pager [phone]  If 7PM-7AM, please contact night-coverage [email or web address] Password [facility]  09/16/2018, 3:18 PM

## 2018-09-16 NOTE — ED Notes (Signed)
Patient transported to X-ray 

## 2018-09-16 NOTE — ED Provider Notes (Signed)
[facility] EMERGENCY DEPARTMENT Provider Note   CSN: [id number] Arrival date & time: 09/16/18  1023     History   Chief Complaint Chief Complaint  Patient presents with  . Shortness of Breath    HPI MACARI ZALESKY is a 57 y.o. male.  HPI   LENARD KAMPF is a 57 y.o. male with hx of COPD who presents to the Emergency Department complaining of gradually worsening shortness of breath since yesterday.  He reports a cough that is productive of white sputum.  He has been using his inhalers and albuterol nebulizer at home without significant relief.  He reports history of similar symptoms in July that required admission.  He states that his symptoms worsened with any exertion.  He denies fever, chills, chest pain, hemoptysis, and peripheral edema.  He continues to smoke.  Sees [facility] pulmonology    Past Medical History:  Diagnosis Date  . Acute blood loss anemia 02/07/2017  . Anxiety   . Arthritis    deg disease, bulging disk,  shoulder level  . Bipolar disorder (HCC)   . Bipolar disorder (HCC)   . 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 Depression    anxiety  . Hyperlipidemia   . Hypertension   . Peptic ulcer disease    Review  . Pneumonia   . [name] (peripheral vascular disease) (HCC) 06/18/2015  . Shortness of breath     Patient Active Problem List   Diagnosis Date Noted  . Hypoxia   . Acute on chronic diastolic HF (heart failure) (HCC)   . Bipolar disorder with severe depression (HCC) 02/01/2018  . Alcohol use disorder, severe, dependence (HCC) 02/01/2018  . Cannabis use disorder, severe, dependence (HCC) 02/01/2018  . MDD (major depressive disorder), recurrent episode, severe (HCC) 01/31/2018  . Internal and external bleeding hemorrhoids   . Rectal bleeding   . Iron deficiency anemia due to chronic blood loss 03/09/2017  . Anemia   . GIB (gastrointestinal bleeding) 02/07/2017  . Acute blood loss anemia 02/07/2017  . Weakness generalized 02/07/2017  . Dizziness  02/07/2017  . Peptic ulcer disease   . Bipolar disorder (HCC)   . 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 Dyspnea 08/26/2016  . H/O: depression 08/26/2016  . Dyslipidemia 08/26/2016  . Tobacco use disorder 08/26/2016  . [name] (peripheral vascular disease) (HCC) 06/18/2015  . Chronic diarrhea 05/02/2013  . Hematochezia 05/02/2013  . Abnormal weight loss 05/02/2013  . Abdominal pain, periumbilical 05/02/2013    Past Surgical History:  Procedure Laterality Date  . BIOPSY  02/09/2017   Procedure: BIOPSY;  Surgeon: [name], MD;  Location: [facility];  Service: Endoscopy;;  duodenal gastric  . COLONOSCOPY  03/14/2007   WUJ:[id number] colonoscopy and terminal ileoscopy except external hemorrhoids  . COLONOSCOPY N/A 05/05/2013   [name]: external/internal anal canal hemorrhoids, unable to intubate TI, segemental biopsies unremarkable   . COLONOSCOPY N/A 04/11/2017   Procedure: COLONOSCOPY;  Surgeon: [name], MD;  Location: [facility];  Service: Endoscopy;  Laterality: N/A;  . COLONOSCOPY WITH PROPOFOL N/A 03/31/2017   Procedure: COLONOSCOPY WITH PROPOFOL;  Surgeon: [name], MD;  Location: [facility];  Service: Endoscopy;  Laterality: N/A;  1:45pm  . ESOPHAGOGASTRODUODENOSCOPY  03/14/2007   NWG:[id number] antral gastritis with bulbar duodenitis/paucity to postbulbar duodenal folds and biopsy were benign with no evidence of villous atrophy.  . ESOPHAGOGASTRODUODENOSCOPY (EGD) WITH PROPOFOL N/A 02/09/2017   Procedure: ESOPHAGOGASTRODUODENOSCOPY (EGD) WITH PROPOFOL;  Surgeon: Darleene Cleaver  Fields, MD;  Location: [facility];  Service: Endoscopy;  Laterality: N/A;  . GIVENS CAPSULE STUDY N/A 04/08/2017   Procedure: GIVENS CAPSULE STUDY;  Surgeon: [name], MD;  Location: [facility];  Service: Endoscopy;  Laterality: N/A;  . HAND SURGERY     left, secondary to self-inflicted laceration  . HEMORRHOID SURGERY N/A 04/14/2017   Procedure: HEMORRHOIDECTOMY;  Surgeon: [name], MD;  Location: [facility] ORS;  Service: General;  Laterality: N/A;  . SHOULDER SURGERY     right  . TOE SURGERY     left great toe , amputated-lawnmover accident      Home Medications    Prior to Admission medications   Medication Sig Start Date End Date Taking? Authorizing Provider  albuterol (PROAIR HFA) 108 (90 Base) MCG/ACT inhaler Inhale 2 puffs into the lungs every 4 (four) hours as needed for wheezing or shortness of breath. 07/01/18  Yes Leslye Peer, MD  albuterol (PROVENTIL) (2.5 MG/3ML) 0.083% nebulizer solution Take 3 mLs (2.5 mg total) by nebulization every 4 (four) hours as needed for shortness of breath. 02/02/17  Yes Roslynn Amble, MD  aspirin EC 81 MG tablet Take 81 mg by mouth every evening.    Yes [provider]  atorvastatin (LIPITOR) 20 MG tablet Take 1 tablet (20 mg total) by mouth daily. For high cholesterol 02/09/18  Yes Money, Gerlene Burdock, FNP  budesonide-formoterol (SYMBICORT) 160-4.5 MCG/ACT inhaler Inhale 2 puffs into the lungs 2 (two) times daily.   Yes [provider]  FLUoxetine (PROZAC) 40 MG capsule Take 1 capsule (40 mg total) by mouth daily. For mood control 07/19/18  Yes Myrlene Broker, MD  furosemide (LASIX) 20 MG tablet Take 1 tablet (20 mg total) by mouth daily. 06/18/18  Yes Vassie Loll, MD  gabapentin (NEURONTIN) 400 MG capsule Take 1 capsule (400 mg total) by mouth 3 (three) times daily. 07/19/18  Yes Myrlene Broker, MD  metoprolol tartrate (LOPRESSOR) 50 MG tablet Take 1 tablet (50 mg total) by mouth 2 (two) times daily. 02/08/18  Yes Money, Gerlene Burdock, FNP  OLANZapine (ZYPREXA) 10 MG tablet Take 1.5 tablets (15 mg total) by mouth at bedtime. For mood stability 07/19/18  Yes Myrlene Broker, MD  pantoprazole (PROTONIX) 20 MG tablet Take 1 tablet (20 mg total) by mouth daily. 06/17/18 06/17/19 Yes Vassie Loll, MD  potassium chloride SA (K-DUR,KLOR-CON) 20 MEQ tablet Take 1 tablet (20 mEq total) by mouth daily. 06/18/18  Yes Vassie Loll, MD  traZODone (DESYREL) 50 MG tablet Take 1 tablet (50 mg total) by mouth at bedtime as needed for sleep. 07/19/18  Yes Myrlene Broker, MD  ipratropium (ATROVENT) 0.02 % nebulizer solution Take 2.5 mLs (0.5 mg total) by nebulization 3 (three) times daily. Patient not taking: Reported on 09/16/2018 01/25/18   Parrett, Virgel Bouquet, NP    Family History Family History  Problem Relation Age of Onset  . Breast cancer Mother        deceased  . Heart disease Father   . Depression Daughter   . Anxiety disorder Daughter   . Anxiety disorder Son   . Depression Son   . Asthma Brother   . Heart attack Maternal Aunt   . Heart attack Maternal Uncle   . Heart attack Paternal Aunt   . Heart attack Paternal Uncle   . Heart attack Maternal Grandmother   . Heart attack Maternal Grandfather   . Emphysema Maternal Grandfather   .  Heart attack Paternal Grandmother   . Heart attack Paternal Grandfather   . Colon cancer Neg Hx   . Liver disease Neg Hx     Social History Social History   Tobacco Use  . Smoking status: Current Every Day Smoker    Packs/day: 1.00    Years: 40.00    Pack years: 40.00    Types: Cigarettes    Start date: 08/04/1971  . Smokeless tobacco: Never Used  . Tobacco comment: peak rate of 2.5ppd, 1/2ppd on 11/27/2016 -- 6 cigarettes / day 01/25/18  Substance Use Topics  . Alcohol use: No    Alcohol/week: 0.0 standard drinks  . Drug use: Yes    Types: Marijuana    Comment: most days      Allergies   Augmentin [amoxicillin-pot clavulanate] and Ace inhibitors   Review of Systems Review of Systems  Constitutional: Negative for appetite change, chills and fever.  HENT: Negative for congestion, sore throat and trouble swallowing.   Respiratory: Positive for cough, shortness of breath and wheezing. Negative for chest tightness.   Cardiovascular: Negative for chest pain and leg swelling.  Gastrointestinal: Negative for abdominal pain, nausea and vomiting.  Genitourinary:  Negative for dysuria.  Musculoskeletal: Negative for arthralgias.  Skin: Negative for rash.  Neurological: Negative for dizziness, weakness and numbness.  Hematological: Negative for adenopathy.     Physical Exam Updated Vital Signs BP (!) 154/70   Pulse 61   Temp 98 F (36.7 C) (Oral)   Resp (!) 30   Ht 5\' 11"  (1.803 m)   Wt 68 kg   SpO2 94%   BMI 20.92 kg/m   Physical Exam  Constitutional: He appears well-developed and well-nourished. He does not appear ill.  HENT:  Head: Normocephalic and atraumatic.  Right Ear: Tympanic membrane and ear canal normal.  Left Ear: Tympanic membrane and ear canal normal.  Mouth/Throat: Uvula is midline, oropharynx is clear and moist and mucous membranes are normal. No oropharyngeal exudate.  Eyes: Pupils are equal, round, and reactive to light. EOM are normal.  Neck: Normal range of motion, full passive range of motion without pain and phonation normal. Neck supple. No JVD present.  Cardiovascular: Normal rate, regular rhythm and intact distal pulses.  No murmur heard. Pulmonary/Chest: Effort normal. No stridor. No respiratory distress. He has wheezes. He has no rales. He exhibits no tenderness.  Lung sounds diminished bilaterally, expiratory wheezes present.  No rales  Abdominal: Soft. He exhibits no distension. There is no tenderness.  Musculoskeletal: He exhibits no edema.  Lymphadenopathy:    He has no cervical adenopathy.  Neurological: He is alert. No sensory deficit. Coordination normal.  Skin: Skin is warm. Capillary refill takes less than 2 seconds. No rash noted.  Nursing note and vitals reviewed.    ED Treatments / Results  Labs (all labs ordered are listed, but only abnormal results are displayed) Labs Reviewed  COMPREHENSIVE METABOLIC PANEL - Abnormal; Notable for the following components:      Result Value   Glucose, Bld 106 (*)    AST 58 (*)    ALT 48 (*)    Alkaline Phosphatase 152 (*)    All other components  within normal limits  BRAIN NATRIURETIC PEPTIDE - Abnormal; Notable for the following components:   B Natriuretic Peptide 407.0 (*)    All other components within normal limits  CBC WITH DIFFERENTIAL/PLATELET  TROPONIN I    EKG EKG Interpretation  Date/Time:  Friday September 16 2018 10:37:43 EDT  Ventricular Rate:  72 PR Interval:    QRS Duration: 85 QT Interval:  419 QTC Calculation: 459 R Axis:   85 Text Interpretation:  Sinus rhythm Confirmed by Vanetta Mulders [phone]) on 09/16/2018 11:31:27 AM   Radiology Dg Chest 2 View  Result Date: 09/16/2018 CLINICAL DATA:  Progressive acute shortness of breath. EXAM: CHEST - 2 VIEW COMPARISON:  Chest x-rays dated 06/16/2018 and 12/21/2017 and chest CT dated 01/04/2018 FINDINGS: There is new slight pulmonary vascular congestion. Heart size is normal. No infiltrates. No effusions. The lungs are hyperinflated consistent with emphysema demonstrated on the prior chest CT. Chronic or recurrent peribronchial thickening. No acute bone abnormality. Previous resection of the distal right clavicle. IMPRESSION: New pulmonary vascular congestion. Emphysema. Chronic or recurrent bronchitic changes. Electronically Signed   By: Francene Boyers M.D.   On: 09/16/2018 11:44    Procedures Procedures (including critical care time)  Medications Ordered in ED Medications  ipratropium-albuterol (DUONEB) 0.5-2.5 (3) MG/3ML nebulizer solution 3 mL (3 mLs Nebulization Given 09/16/18 1136)  albuterol (PROVENTIL) (2.5 MG/3ML) 0.083% nebulizer solution 2.5 mg (2.5 mg Nebulization Given 09/16/18 1142)  methylPREDNISolone sodium succinate (SOLU-MEDROL) 125 mg/2 mL injection 125 mg (125 mg Intravenous Given 09/16/18 1125)  albuterol (PROVENTIL,VENTOLIN) solution continuous neb (7.5 mg/hr Nebulization Given 09/16/18 1248)  furosemide (LASIX) tablet 40 mg (40 mg Oral Given 09/16/18 1247)     Initial Impression / Assessment and Plan / ED Course  I have reviewed the  triage vital signs and the nursing notes.  Pertinent labs & imaging results that were available during my care of the patient were reviewed by me and considered in my medical decision making (see chart for details).     Patient with likely COPD exacerbation.  Reports feeling better after continuous albuterol neb, but O2 sat dropped to 88% upon ambulation.  No chest pain. BNP elevated, will give lasix.  I feel that he will need admission for COPD exacerbation.    1450  Consulted Dr. Sherryll Burger who agrees to admit  Final Clinical Impressions(s) / ED Diagnoses   Final diagnoses:  COPD exacerbation [facility])    ED Discharge Orders    None       Pauline Aus, PA-C 09/16/18 1726    Samuel Jester, DO 09/19/18 [phone]

## 2018-09-16 NOTE — ED Notes (Signed)
Pt ambulated around ED per [facility].   Pt initial o2 was 90%. Sats dropped to 88% at lowest.   Pt initial Hr was 90. Rose to max 110.   Pt ambulated around nurses desk x 2. Pt stated he was starting to feel "a little short breathed". Pt did not c/o cp or tightness. Pt gait was good.

## 2018-09-16 NOTE — ED Triage Notes (Signed)
Pt c/o of sob since yesterday.  Worsening today with exertion.  HX of COPD

## 2018-09-17 DIAGNOSIS — J441 Chronic obstructive pulmonary disease with (acute) exacerbation: Secondary | ICD-10-CM | POA: Diagnosis not present

## 2018-09-17 LAB — BASIC METABOLIC PANEL
Anion gap: 14 (ref 5–15)
BUN: 13 mg/dL (ref 6–20)
CO2: 25 mmol/L (ref 22–32)
Calcium: 9 mg/dL (ref 8.9–10.3)
Chloride: 99 mmol/L (ref 98–111)
Creatinine, Ser: 0.95 mg/dL (ref 0.61–1.24)
GFR calc Af Amer: >60 mL/min (ref >60)
GFR calc non Af Amer: >60 mL/min (ref >60)
Glucose, Bld: 116 mg/dL — ABNORMAL HIGH (ref 70–99)
Potassium: 3.4 mmol/L — ABNORMAL LOW (ref 3.5–5.1)
Sodium: 138 mmol/L (ref 135–145)

## 2018-09-17 LAB — CBC
HCT: 47.9 % (ref 39.0–52.0)
Hemoglobin: 15.3 g/dL (ref 13.0–17.0)
MCH: 28.3 pg (ref 26.0–34.0)
MCHC: 31.9 g/dL (ref 30.0–36.0)
MCV: 88.5 fL (ref 80.0–100.0)
Platelets: 290 10*3/uL (ref 150–400)
RBC: 5.41 MIL/uL (ref 4.22–5.81)
RDW: 13.1 % (ref 11.5–15.5)
WBC: 13.2 10*3/uL — ABNORMAL HIGH (ref 4.0–10.5)
nRBC: 0 % (ref 0.0–0.2)

## 2018-09-17 LAB — HIV ANTIBODY (ROUTINE TESTING W REFLEX): HIV Screen 4th Generation wRfx: NONREACTIVE

## 2018-09-17 MED ORDER — PREDNISONE 20 MG PO TABS: 40.0000 mg | ORAL_TABLET | Freq: Every day | ORAL | 0 refills | Status: AC

## 2018-09-17 MED ORDER — GUAIFENESIN-DM 100-10 MG/5ML PO SYRP
5.0000 mL | ORAL_SOLUTION | ORAL | Status: DC | PRN
  Administered 2018-09-17: 5 mL via ORAL
  Filled 2018-09-17: qty 5

## 2018-09-17 MED ORDER — IPRATROPIUM-ALBUTEROL 0.5-2.5 (3) MG/3ML IN SOLN
3.0000 mL | Freq: Four times a day (QID) | RESPIRATORY_TRACT | Status: DC
  Administered 2018-09-17: 3 mL via RESPIRATORY_TRACT
  Filled 2018-09-17: qty 3

## 2018-09-17 MED ORDER — POTASSIUM CHLORIDE CRYS ER 20 MEQ PO TBCR
40.0000 meq | EXTENDED_RELEASE_TABLET | Freq: Once | ORAL | Status: AC
  Administered 2018-09-17: 40 meq via ORAL

## 2018-09-17 NOTE — Discharge Summary (Signed)
Physician Discharge Summary  Jeffrey [name] WJX:[id number] DOB: Mar 31, 1961 DOA: 09/16/2018  PCP: Gareth Morgan, MD  Admit date: 09/16/2018  Discharge date: 09/17/2018  Admitted From:Home  Disposition:  Home  Recommendations for Outpatient Follow-up:  1. Follow up with PCP in 1-2 weeks 2. Continue on prednisone as prescribed for 5 more days 3. Continue to use DuoNeb's at home as needed for shortness of breath or wheezing  Home Health: None  Equipment/Devices: None  Discharge Condition: Stable  CODE STATUS: Full  Diet recommendation: Heart Healthy  Brief/Interim Summary: Jeffrey [name] is a 57 y.o. male with medical history significant for COPD with ongoing tobacco abuse, depression/anxiety, bipolar disorder, dyslipidemia, hypertension, and GERD who presents to the emergency department today with worsening cough productive of whitish sputum over the last several days as well as shortness of breath that began on the day prior to admission.  He was noted to have a mild COPD exacerbation for which he was started on IV steroids and breathing treatments with significant improvement overnight.  He states that he is back to his usual baseline and not requiring any further oxygen at this time.  He has had no other acute events during the course of this admission and is otherwise stable for discharge to continue using home nebulizer treatments as well as oral steroids for the next few days.  He will schedule a follow-up with his pulmonologist in [place] in the near future.  Discharge Diagnoses:  Principal Problem:   COPD exacerbation (HCC) Active Problems:   H/O: depression   Dyslipidemia   Tobacco use disorder   Essential hypertension   GERD (gastroesophageal reflux disease)  Principal diagnosis: COPD exacerbation  Discharge Instructions  Discharge Instructions    Diet - low sodium heart healthy   Complete by:  As directed    Increase activity slowly   Complete by:  As  directed      Allergies as of 09/17/2018      Reactions   Augmentin [amoxicillin-pot Clavulanate] Rash, Other (See Comments)   Has patient had a PCN reaction causing immediate rash, facial/tongue/throat swelling, SOB or lightheadedness with hypotension: No Has patient had a PCN reaction causing severe rash involving mucus membranes or skin necrosis: No Has patient had a PCN reaction that required hospitalization No Has patient had a PCN reaction occurring within the last 10 years: Yes If all of the above answers are "NO", then may proceed with Cephalosporin use.   Ace Inhibitors Hives      Medication List    TAKE these medications   albuterol (2.5 MG/3ML) 0.083% nebulizer solution Commonly known as:  PROVENTIL Take 3 mLs (2.5 mg total) by nebulization every 4 (four) hours as needed for shortness of breath.   albuterol 108 (90 Base) MCG/ACT inhaler Commonly known as:  PROVENTIL HFA;VENTOLIN HFA Inhale 2 puffs into the lungs every 4 (four) hours as needed for wheezing or shortness of breath.   aspirin EC 81 MG tablet Take 81 mg by mouth every evening.   atorvastatin 20 MG tablet Commonly known as:  LIPITOR Take 1 tablet (20 mg total) by mouth daily. For high cholesterol   budesonide-formoterol 160-4.5 MCG/ACT inhaler Commonly known as:  SYMBICORT Inhale 2 puffs into the lungs 2 (two) times daily.   FLUoxetine 40 MG capsule Commonly known as:  PROZAC Take 1 capsule (40 mg total) by mouth daily. For mood control   furosemide 20 MG tablet Commonly known as:  LASIX Take 1 tablet (20 mg total)  by mouth daily.   gabapentin 400 MG capsule Commonly known as:  NEURONTIN Take 1 capsule (400 mg total) by mouth 3 (three) times daily.   ipratropium 0.02 % nebulizer solution Commonly known as:  ATROVENT Take 2.5 mLs (0.5 mg total) by nebulization 3 (three) times daily.   metoprolol tartrate 50 MG tablet Commonly known as:  LOPRESSOR Take 1 tablet (50 mg total) by mouth 2 (two)  times daily.   OLANZapine 10 MG tablet Commonly known as:  ZYPREXA Take 1.5 tablets (15 mg total) by mouth at bedtime. For mood stability   pantoprazole 20 MG tablet Commonly known as:  PROTONIX Take 1 tablet (20 mg total) by mouth daily.   potassium chloride SA 20 MEQ tablet Commonly known as:  K-DUR,KLOR-CON Take 1 tablet (20 mEq total) by mouth daily.   predniSONE 20 MG tablet Commonly known as:  DELTASONE Take 2 tablets (40 mg total) by mouth daily with breakfast for 5 days. Start taking on:  09/18/2018   traZODone 50 MG tablet Commonly known as:  DESYREL Take 1 tablet (50 mg total) by mouth at bedtime as needed for sleep.      Follow-up Information    Gareth Morgan, MD Follow up in 2 week(s).   Specialty:  Family Medicine Contact information: [address] [place] [address] [phone]          Allergies  Allergen Reactions  . Augmentin [Amoxicillin-Pot Clavulanate] Rash and Other (See Comments)    Has patient had a PCN reaction causing immediate rash, facial/tongue/throat swelling, SOB or lightheadedness with hypotension: No Has patient had a PCN reaction causing severe rash involving mucus membranes or skin necrosis: No Has patient had a PCN reaction that required hospitalization No Has patient had a PCN reaction occurring within the last 10 years: Yes If all of the above answers are "NO", then may proceed with Cephalosporin use.  Donivan Scull Inhibitors Hives    Consultations:  None   Procedures/Studies: Dg Chest 2 View  Result Date: 09/16/2018 CLINICAL DATA:  Progressive acute shortness of breath. EXAM: CHEST - 2 VIEW COMPARISON:  Chest x-rays dated 06/16/2018 and 12/21/2017 and chest CT dated 01/04/2018 FINDINGS: There is new slight pulmonary vascular congestion. Heart size is normal. No infiltrates. No effusions. The lungs are hyperinflated consistent with emphysema demonstrated on the prior chest CT. Chronic or recurrent peribronchial  thickening. No acute bone abnormality. Previous resection of the distal right clavicle. IMPRESSION: New pulmonary vascular congestion. Emphysema. Chronic or recurrent bronchitic changes. Electronically Signed   By: Francene Boyers M.D.   On: 09/16/2018 11:44     Discharge Exam: Vitals:   09/17/18 0749 09/17/18 0753  BP:    Pulse:    Resp:    Temp:    SpO2: 93% 98%   Vitals:   09/16/18 2030 09/17/18 0544 09/17/18 0749 09/17/18 0753  BP: 123/67 (!) 159/77    Pulse: 71 86    Resp: 18 20    Temp: 98.3 F (36.8 C) 98 F (36.7 C)    TempSrc: Oral Oral    SpO2: 95% 96% 93% 98%  Weight:      Height:        General: Pt is alert, awake, not in acute distress Cardiovascular: RRR, S1/S2 +, no rubs, no gallops Respiratory: CTA bilaterally, no wheezing, no rhonchi Abdominal: Soft, NT, ND, bowel sounds + Extremities: no edema, no cyanosis    The results of significant diagnostics from this hospitalization (including imaging, microbiology, ancillary  and laboratory) are listed below for reference.     Microbiology: No results found for this or any previous visit (from the past 240 hour(s)).   Labs: BNP (last 3 results) Recent Labs    06/16/18 0151 09/16/18 1049  BNP 315.0* 407.0*   Basic Metabolic Panel: Recent Labs  Lab 09/16/18 1049 09/17/18 0613  NA 139 138  K 3.9 3.4*  CL 105 99  CO2 27 25  GLUCOSE 106* 116*  BUN 8 13  CREATININE 0.80 0.95  CALCIUM 9.0 9.0   Liver Function Tests: Recent Labs  Lab 09/16/18 1049  AST 58*  ALT 48*  ALKPHOS 152*  BILITOT 0.8  PROT 6.9  ALBUMIN 3.8   No results for input(s): LIPASE, AMYLASE in the last 168 hours. No results for input(s): AMMONIA in the last 168 hours. CBC: Recent Labs  Lab 09/16/18 1049 09/17/18 0613  WBC 8.6 13.2*  NEUTROABS 7.1  --   HGB 14.3 15.3  HCT 44.9 47.9  MCV 88.9 88.5  PLT 234 290   Cardiac Enzymes: Recent Labs  Lab 09/16/18 1049  TROPONINI <0.03   BNP: Invalid input(s):  POCBNP CBG: No results for input(s): GLUCAP in the last 168 hours. D-Dimer No results for input(s): DDIMER in the last 72 hours. Hgb A1c No results for input(s): HGBA1C in the last 72 hours. Lipid Profile No results for input(s): CHOL, HDL, LDLCALC, TRIG, CHOLHDL, LDLDIRECT in the last 72 hours. Thyroid function studies No results for input(s): TSH, T4TOTAL, T3FREE, THYROIDAB in the last 72 hours.  Invalid input(s): FREET3 Anemia work up No results for input(s): VITAMINB12, FOLATE, FERRITIN, TIBC, IRON, RETICCTPCT in the last 72 hours. Urinalysis    Component Value Date/Time   COLORURINE YELLOW 06/16/2018 0156   APPEARANCEUR CLEAR 06/16/2018 0156   LABSPEC 1.012 06/16/2018 0156   PHURINE 6.0 06/16/2018 0156   GLUCOSEU NEGATIVE 06/16/2018 0156   HGBUR NEGATIVE 06/16/2018 0156   BILIRUBINUR NEGATIVE 06/16/2018 0156   KETONESUR NEGATIVE 06/16/2018 0156   PROTEINUR NEGATIVE 06/16/2018 0156   UROBILINOGEN 0.2 03/01/2011 1427   NITRITE NEGATIVE 06/16/2018 0156   LEUKOCYTESUR NEGATIVE 06/16/2018 0156   Sepsis Labs Invalid input(s): PROCALCITONIN,  WBC,  LACTICIDVEN Microbiology No results found for this or any previous visit (from the past 240 hour(s)).   Time coordinating discharge: 35 minutes  SIGNED:   Erick Blinks, DO [facility] 09/17/2018, 10:23 AM Pager [phone]  If 7PM-7AM, please contact night-coverage [email or web address] Password [email or web address]

## 2018-09-17 NOTE — Progress Notes (Signed)
Patient's IV removed and site intact. Patient discharged with personal belongings and prescriptions.  

## 2018-09-19 ENCOUNTER — Ambulatory Visit (HOSPITAL_COMMUNITY): Payer: Self-pay | Admitting: Psychiatry

## 2018-09-20 ENCOUNTER — Ambulatory Visit (HOSPITAL_COMMUNITY): Payer: Self-pay | Admitting: Licensed Clinical Social Worker

## 2018-10-31 ENCOUNTER — Other Ambulatory Visit: Payer: Self-pay | Admitting: Adult Health

## 2018-11-01 ENCOUNTER — Telehealth: Payer: Self-pay | Admitting: Emergency Medicine

## 2018-11-01 MED ORDER — TIOTROPIUM BROMIDE-OLODATEROL 2.5-2.5 MCG/ACT IN AERS: 2.0000 | INHALATION_SPRAY | Freq: Every day | RESPIRATORY_TRACT | 5 refills | Status: DC

## 2018-11-01 NOTE — Telephone Encounter (Signed)
Called patient, unable to reach left message to give us a call back. 

## 2018-11-01 NOTE — Telephone Encounter (Signed)
Spoke with pt, he states he didn't realize he was supposed to be on Stiolto. He states the Symbicort is not helping that great anyway and he would like to try the Stiolto. If that's what RB wanted him to do. I sent in the Rx for Stiolto and reminded him to d/c the symbicort. Pt understood and nothing further is needed.

## 2018-11-01 NOTE — Telephone Encounter (Signed)
As you said, he was supposed to be on Stiolto. If he is using stiolto then I don't want him on symbicort.

## 2018-11-01 NOTE — Telephone Encounter (Signed)
Pt called back but call dropped before getting call back number. Please return his call.

## 2018-11-01 NOTE — Telephone Encounter (Signed)
Called and spoke with patient, he stated that he is out of the symbicort and is requesting a refill to be called in today. When looking at last AVS note patient was to continue taking the stiolto and nothing was stated about the symbicort. When looking at the patients chart there is a symbicort prescription but no stiolto prescription.   RB please advise which medication the patient should be on and which strength to send in. Thank you.

## 2018-11-28 ENCOUNTER — Encounter (HOSPITAL_COMMUNITY): Payer: Self-pay | Admitting: Psychiatry

## 2018-11-28 ENCOUNTER — Ambulatory Visit (INDEPENDENT_AMBULATORY_CARE_PROVIDER_SITE_OTHER): Payer: Medicare Other | Admitting: Psychiatry

## 2018-11-28 VITALS — BP 125/70 | HR 70 | Ht 71.0 in | Wt 150.0 lb

## 2018-11-28 DIAGNOSIS — F332 Major depressive disorder, recurrent severe without psychotic features: Secondary | ICD-10-CM

## 2018-11-28 DIAGNOSIS — F3162 Bipolar disorder, current episode mixed, moderate: Secondary | ICD-10-CM | POA: Diagnosis not present

## 2018-11-28 MED ORDER — OLANZAPINE 10 MG PO TABS: 15.0000 mg | ORAL_TABLET | Freq: Every day | ORAL | 2 refills | Status: DC

## 2018-11-28 MED ORDER — GABAPENTIN 400 MG PO CAPS: 400.0000 mg | ORAL_CAPSULE | Freq: Three times a day (TID) | ORAL | 2 refills | Status: DC

## 2018-11-28 MED ORDER — FLUOXETINE HCL 40 MG PO CAPS: 40.0000 mg | ORAL_CAPSULE | Freq: Every day | ORAL | 2 refills | Status: DC

## 2018-11-28 MED ORDER — DIVALPROEX SODIUM ER 500 MG PO TB24: 1500.0000 mg | ORAL_TABLET | Freq: Every day | ORAL | 2 refills | Status: DC

## 2018-11-28 MED ORDER — TRAZODONE HCL 50 MG PO TABS: 50.0000 mg | ORAL_TABLET | Freq: Every evening | ORAL | 2 refills | Status: DC | PRN

## 2018-11-28 NOTE — Progress Notes (Signed)
BH MD/PA/NP OP Progress Note  11/28/2018 3:26 PM Jeffrey [name]  MRN:  [id number]  Chief Complaint:  Chief Complaint    Depression; Anxiety; Manic Behavior; Follow-up     HPI: This patient is a 58 year old separated white male who [place]. 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  The patient had been going to [facility] for the last couple of years but they no longer take his insurance and he was therefore referred here. His daughter states that he is not doing well in terms of mood and needs to be seen anyway.  The patient has a long history of depression that dates back to his early 6320s. At that time he was married to his first wife and for a while it went well. However when they separated she did not allow him to see his son and this went on for a period of about 8 years. He became increasingly stressed and depressed during this time. He was hospitalized several times for suicide attempts and was in the [facility] twice in 2009. His daughter thinks his last hospital they shows approximately 5 years ago.  The patient married again and he has been married to his second wife for 17 years. She has 3 adult children and one of her sons is very violent. He is finally decided to leave his wife because her son has fought with him several times and he feels like they're trying to take away his land. Over the last month he has been staying with his daughter but is very stressed about the whole situation. He's not able to eat and has lost 40 pounds over last year. His mood is very low and sad. His memory is poor. He smokes marijuana intermittently to try to deal with the stress. He has no interest in anything other than his children and grandchildren. His energy is nonexistent and he cannot get himself out of bed. He sees Dr. Andrey CampanileWilson for primary care and was there recently and apparently there was nothing medically wrong but we  do not have these records. He has had a recent colonoscopy that was negative. He denies auditory or visual hallucinations and does not use alcohol or drugs other than marijuana  He has been on a combination of Depakote Paxil Navane and BuSpar for number of years. Nothing has been change in the last couple of years despite his continued downward slide  The patient returns after about 5 months.  He has missed some appointments.  He states that for the most part he is doing okay.  He is on a new inhaler that is helping his breathing.  He has also quit smoking a few days ago but is struggling with this.  He is going to try to get a NicoDerm patch.  His mood is been fairly good but he is much more angry and irritable than he used to be.  He admits however that he ran out of olanzapine a few weeks ago.  I explained that if he misses appointments he needs to let me know so I can refill his medication.  He still taking the Zyprexa and his other medications.  He is sleeping fairly well.  He claims he has cut down his marijuana use and does not use cocaine.  He seems brighter and more alert and currently denies suicidal ideation. Visit Diagnosis:    ICD-10-CM   1. Moderate mixed bipolar I disorder (  HCC) F31.62 Valproic Acid level  2. 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 Acute blood loss anemia 02/07/2017  . Anxiety   . Arthritis    deg disease, bulging disk,  shoulder level  . Bipolar disorder (HCC)   . Bipolar disorder (HCC)   . 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 Depression    anxiety  . Hyperlipidemia   . Hypertension   . Peptic ulcer disease    Review  . Pneumonia   . [name] (peripheral vascular disease) (HCC) 06/18/2015  . Shortness of breath     Past  Surgical History:  Procedure Laterality Date  . BIOPSY  02/09/2017   Procedure: BIOPSY;  Surgeon: [name], MD;  Location: [facility];  Service: Endoscopy;;  duodenal gastric  . COLONOSCOPY  03/14/2007   RUE:AVWUJW colonoscopy and terminal ileoscopy except external hemorrhoids  . COLONOSCOPY N/A 05/05/2013   [name]: external/internal anal canal hemorrhoids, unable to intubate TI, segemental biopsies unremarkable   . COLONOSCOPY N/A 04/11/2017   Procedure: COLONOSCOPY;  Surgeon: [name], MD;  Location: [facility];  Service: Endoscopy;  Laterality: N/A;  . COLONOSCOPY WITH PROPOFOL N/A 03/31/2017   Procedure: COLONOSCOPY WITH PROPOFOL;  Surgeon: [name], MD;  [facility];  Service: Endoscopy;  Laterality: N/A;  1:45pm  . ESOPHAGOGASTRODUODENOSCOPY  03/14/2007   [id number] antral gastritis with bulbar duodenitis/paucity to postbulbar duodenal folds and biopsy were benign with no evidence of villous atrophy.  . ESOPHAGOGASTRODUODENOSCOPY (EGD) WITH PROPOFOL N/A 02/09/2017   Procedure: ESOPHAGOGASTRODUODENOSCOPY (EGD) WITH PROPOFOL;  Surgeon: [name], MD;  [facility];  Service: Endoscopy;  Laterality: N/A;  . GIVENS CAPSULE STUDY N/A 04/08/2017   Procedure: GIVENS CAPSULE STUDY;  Surgeon: [name], MD;  Location: [facility];  Service: Endoscopy;  Laterality: N/A;  . HAND SURGERY     left, secondary to self-inflicted laceration  . HEMORRHOID SURGERY N/A 04/14/2017   Procedure: HEMORRHOIDECTOMY;  Surgeon: [name], MD;  Location: [facility];  Service: General;  Laterality: N/A;  . SHOULDER SURGERY     right  . TOE SURGERY     left great toe , amputated-lawnmover accident    Family Psychiatric History: See below  Family History:  Family History  Problem Relation Age of Onset  . Breast cancer Mother        deceased  . Heart disease Father   . Depression Daughter   . Anxiety disorder Daughter   . Anxiety disorder Son    . Depression Son   . Asthma Brother   . Heart attack Maternal Aunt   . Heart attack Maternal Uncle   . Heart attack Paternal Aunt   . Heart attack Paternal Uncle   . Heart attack Maternal Grandmother   . Heart attack Maternal Grandfather   . Emphysema Maternal Grandfather   . Heart attack Paternal Grandmother   . Heart attack Paternal Grandfather   . Colon cancer Neg Hx   . Liver disease Neg Hx     Social History:  Social History   Socioeconomic History  . Marital status: Divorced    Spouse name: Not on file  . Number of children: 2  . Years of education: Not on file  . Highest education  level: Not on file  Occupational History  . Occupation: Disabled  Social Needs  . Financial resource strain: Not on file  . Food insecurity:    Worry: Not on file    Inability: Not on file  . Transportation needs:    Medical: Not on file    Non-medical: Not on file  Tobacco Use  . Smoking status: Current Every Day Smoker    Packs/day: 1.00    Years: 40.00    Pack years: 40.00    Types: Cigarettes    Start date: 08/04/1971  . Smokeless tobacco: Never Used  . Tobacco comment: peak rate of 2.5ppd, 1/2ppd on 11/27/2016 -- 6 cigarettes / day 01/25/18  Substance and Sexual Activity  . Alcohol use: No    Alcohol/week: 0.0 standard drinks  . Drug use: Yes    Types: Marijuana    Comment: most days   . Sexual activity: Never  Lifestyle  . Physical activity:    Days per week: Not on file    Minutes per session: Not on file  . Stress: Not on file  Relationships  . Social connections:    Talks on phone: Not on file    Gets together: Not on file    Attends religious service: Not on file    Active member of club or organization: Not on file    Attends meetings of clubs or organizations: Not on file    Relationship status: Not on file  Other Topics Concern  . Not on file  Social History Narrative   Originally from [place]. Previously has lived in [facility] [place] 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 Questionable asbestos exposure. Does have significant exposure to fumes. No mold exposure. No bird exposure. No pets currently.     Allergies:  Allergies  Allergen Reactions  . Augmentin [Amoxicillin-Pot Clavulanate] Rash and Other (See Comments)    Has patient had a PCN reaction causing immediate rash, facial/tongue/throat swelling, SOB or lightheadedness with hypotension: No Has patient had a PCN reaction causing severe rash involving mucus membranes or skin necrosis: No Has patient had a PCN reaction that required hospitalization No Has patient had a PCN reaction occurring within the last 10 years: Yes If all of the above answers are "NO", then may proceed with Cephalosporin use.  . Ace Inhibitors Hives    Metabolic Disorder Labs: No results found for: HGBA1C, MPG No results found for: PROLACTIN No results found for: CHOL, TRIG, HDL, CHOLHDL, VLDL, LDLCALC   Therapeutic Level Labs: No results found for: LITHIUM Lab Results  Component Value Date   VALPROATE 14 (L) 01/30/2018   VALPROATE 68.1 05/24/2017   No components found for:  CBMZ  Current Medications: Current Outpatient Medications  Medication Sig Dispense Refill  . albuterol (PROAIR HFA) 108 (90 Base) MCG/ACT inhaler Inhale 2 puffs into the lungs every 4 (four) hours as needed for wheezing or shortness of breath. 1 Inhaler 5  . albuterol (PROVENTIL) (2.5 MG/3ML) 0.083% nebulizer solution Take 3 mLs (2.5 mg total) by nebulization every 4 (four) hours as needed for shortness of breath. 120 mL 3  . aspirin EC 81 MG tablet Take 81 mg by mouth every evening.     Marland Kitchen atorvastatin (LIPITOR) 20 MG tablet Take 1 tablet (20 mg total) by mouth daily. For high cholesterol 30 tablet 0  . budesonide-formoterol (SYMBICORT) 160-4.5 MCG/ACT inhaler Inhale 2 puffs into the lungs 2 (two)  times daily.    Marland Kitchen. FLUoxetine (PROZAC) 40 MG capsule Take 1 capsule (40 mg total)  by mouth daily. For mood control 30 capsule 2  . furosemide (LASIX) 20 MG tablet Take 1 tablet (20 mg total) by mouth daily. 30 tablet 1  . gabapentin (NEURONTIN) 400 MG capsule Take 1 capsule (400 mg total) by mouth 3 (three) times daily. 90 capsule 2  . ipratropium (ATROVENT) 0.02 % nebulizer solution Take 2.5 mLs (0.5 mg total) by nebulization 3 (three) times daily. 225 mL 11  . metoprolol tartrate (LOPRESSOR) 50 MG tablet Take 1 tablet (50 mg total) by mouth 2 (two) times daily. 60 tablet 0  . OLANZapine (ZYPREXA) 10 MG tablet Take 1.5 tablets (15 mg total) by mouth at bedtime. For mood stability 45 tablet 2  . pantoprazole (PROTONIX) 20 MG tablet Take 1 tablet (20 mg total) by mouth daily. 30 tablet 1  . potassium chloride SA (K-DUR,KLOR-CON) 20 MEQ tablet Take 1 tablet (20 mEq total) by mouth daily. 30 tablet 1  . Tiotropium Bromide-Olodaterol (STIOLTO RESPIMAT) 2.5-2.5 MCG/ACT AERS Inhale 2 puffs into the lungs daily. 1 Inhaler 5  . traZODone (DESYREL) 50 MG tablet Take 1 tablet (50 mg total) by mouth at bedtime as needed for sleep. 30 tablet 2  . divalproex (DEPAKOTE ER) 500 MG 24 hr tablet Take 3 tablets (1,500 mg total) by mouth at bedtime. 90 tablet 2   No current facility-administered medications for this visit.      Musculoskeletal: Strength & Muscle Tone: within normal limits Gait & Station: normal Patient leans: N/A  Psychiatric Specialty Exam: Review of Systems  Constitutional: Positive for malaise/fatigue.  Respiratory: Positive for shortness of breath.   Psychiatric/Behavioral: Positive for memory loss.  All other systems reviewed and are negative.   Blood pressure 125/70, pulse 70, height 5\' 11"  (1.803 m), weight 150 lb (68 kg), SpO2 94 %.Body mass index is 20.92 kg/m.  General Appearance: Casual and Fairly Groomed  Eye Contact:  Good  Speech:  Clear and Coherent  Volume:  Normal  Mood:  Euthymic  Affect:  Appropriate and Congruent  Thought Process:  Goal  Directed  Orientation:  Full (Time, Place, and Person)  Thought Content: Rumination   Suicidal Thoughts:  No  Homicidal Thoughts:  No  Memory:  Immediate;   Good Recent;   Fair Remote;   Fair  Judgement:  Fair  Insight:  Fair  Psychomotor Activity:  Decreased  Concentration:  Concentration: Fair and Attention Span: Fair  Recall:  Good  Fund of Knowledge: Good  Language: Good  Akathisia:  No  Handed:  Right  AIMS (if indicated): not done  Assets:  Communication Skills Desire for Improvement Resilience Social Support Talents/Skills  ADL's:  Intact  Cognition: WNL  Sleep:  Good   Screenings: AIMS     [facility]  AIMS Total Score  0    AUDIT     [facility]  Alcohol Use Disorder Identification Test Final Score (AUDIT)  18       Assessment and Plan: This patient is a 58 year old male with a history of bipolar disorder.  He seems to be fairly stable but is more irritable since he ran out of olanzapine.  He will restart olanzapine 15 mg at bedtime, continue Depakote ER 1500 mg at bedtime stabilization, trazodone 50 mg at bedtime for sleep and gabapentin 400 mg  3 times daily for anxiety.  He admits he has not been taking the gabapentin consistently.  He will also check a Depakote level.  He will return to see me in 3 months   Diannia Ruder, MD 11/28/2018, 3:26 PM

## 2018-12-16 ENCOUNTER — Emergency Department (HOSPITAL_COMMUNITY): Payer: Medicare Other

## 2018-12-16 ENCOUNTER — Other Ambulatory Visit: Payer: Self-pay

## 2018-12-16 ENCOUNTER — Inpatient Hospital Stay (HOSPITAL_COMMUNITY)
Admission: EM | Admit: 2018-12-16 | Discharge: 2019-01-13 | DRG: 640 | Disposition: A | Discharge status: Home Health | Payer: Medicare Other | Attending: Internal Medicine | Admitting: Internal Medicine

## 2018-12-16 ENCOUNTER — Encounter (HOSPITAL_COMMUNITY): Payer: Self-pay

## 2018-12-16 ENCOUNTER — Inpatient Hospital Stay (HOSPITAL_COMMUNITY): Payer: Medicare Other

## 2018-12-16 DIAGNOSIS — T407X1A Poisoning by cannabis (derivatives), accidental (unintentional), initial encounter: Secondary | ICD-10-CM | POA: Diagnosis present

## 2018-12-16 DIAGNOSIS — F1026 Alcohol dependence with alcohol-induced persisting amnestic disorder: Secondary | ICD-10-CM | POA: Diagnosis present

## 2018-12-16 DIAGNOSIS — J690 Pneumonitis due to inhalation of food and vomit: Secondary | ICD-10-CM | POA: Diagnosis not present

## 2018-12-16 DIAGNOSIS — F17210 Nicotine dependence, cigarettes, uncomplicated: Secondary | ICD-10-CM | POA: Diagnosis present

## 2018-12-16 DIAGNOSIS — K7200 Acute and subacute hepatic failure without coma: Secondary | ICD-10-CM | POA: Diagnosis present

## 2018-12-16 DIAGNOSIS — R1310 Dysphagia, unspecified: Secondary | ICD-10-CM | POA: Diagnosis not present

## 2018-12-16 DIAGNOSIS — I5032 Chronic diastolic (congestive) heart failure: Secondary | ICD-10-CM

## 2018-12-16 DIAGNOSIS — Z4659 Encounter for fitting and adjustment of other gastrointestinal appliance and device: Secondary | ICD-10-CM

## 2018-12-16 DIAGNOSIS — F314 Bipolar disorder, current episode depressed, severe, without psychotic features: Secondary | ICD-10-CM | POA: Diagnosis present

## 2018-12-16 DIAGNOSIS — F10231 Alcohol dependence with withdrawal delirium: Secondary | ICD-10-CM

## 2018-12-16 DIAGNOSIS — Z978 Presence of other specified devices: Secondary | ICD-10-CM

## 2018-12-16 DIAGNOSIS — R34 Anuria and oliguria: Secondary | ICD-10-CM | POA: Diagnosis not present

## 2018-12-16 DIAGNOSIS — J150 Pneumonia due to Klebsiella pneumoniae: Secondary | ICD-10-CM | POA: Diagnosis present

## 2018-12-16 DIAGNOSIS — Z9911 Dependence on respirator [ventilator] status: Secondary | ICD-10-CM

## 2018-12-16 DIAGNOSIS — Z781 Physical restraint status: Secondary | ICD-10-CM

## 2018-12-16 DIAGNOSIS — Z9181 History of falling: Secondary | ICD-10-CM

## 2018-12-16 DIAGNOSIS — Z89412 Acquired absence of left great toe: Secondary | ICD-10-CM

## 2018-12-16 DIAGNOSIS — I110 Hypertensive heart disease with heart failure: Secondary | ICD-10-CM | POA: Diagnosis present

## 2018-12-16 DIAGNOSIS — R05 Cough: Secondary | ICD-10-CM | POA: Diagnosis not present

## 2018-12-16 DIAGNOSIS — F418 Other specified anxiety disorders: Secondary | ICD-10-CM | POA: Diagnosis present

## 2018-12-16 DIAGNOSIS — R0902 Hypoxemia: Secondary | ICD-10-CM

## 2018-12-16 DIAGNOSIS — Z515 Encounter for palliative care: Secondary | ICD-10-CM

## 2018-12-16 DIAGNOSIS — Z8249 Family history of ischemic heart disease and other diseases of the circulatory system: Secondary | ICD-10-CM

## 2018-12-16 DIAGNOSIS — E876 Hypokalemia: Secondary | ICD-10-CM | POA: Diagnosis present

## 2018-12-16 DIAGNOSIS — K219 Gastro-esophageal reflux disease without esophagitis: Secondary | ICD-10-CM | POA: Diagnosis present

## 2018-12-16 DIAGNOSIS — G9340 Encephalopathy, unspecified: Secondary | ICD-10-CM | POA: Diagnosis not present

## 2018-12-16 DIAGNOSIS — J449 Chronic obstructive pulmonary disease, unspecified: Secondary | ICD-10-CM | POA: Diagnosis present

## 2018-12-16 DIAGNOSIS — K7290 Hepatic failure, unspecified without coma: Secondary | ICD-10-CM | POA: Diagnosis not present

## 2018-12-16 DIAGNOSIS — J9690 Respiratory failure, unspecified, unspecified whether with hypoxia or hypercapnia: Secondary | ICD-10-CM

## 2018-12-16 DIAGNOSIS — K7682 Hepatic encephalopathy: Secondary | ICD-10-CM | POA: Insufficient documentation

## 2018-12-16 DIAGNOSIS — E785 Hyperlipidemia, unspecified: Secondary | ICD-10-CM | POA: Diagnosis present

## 2018-12-16 DIAGNOSIS — Z818 Family history of other mental and behavioral disorders: Secondary | ICD-10-CM

## 2018-12-16 DIAGNOSIS — Z01818 Encounter for other preprocedural examination: Secondary | ICD-10-CM

## 2018-12-16 DIAGNOSIS — J9600 Acute respiratory failure, unspecified whether with hypoxia or hypercapnia: Secondary | ICD-10-CM

## 2018-12-16 DIAGNOSIS — Z881 Allergy status to other antibiotic agents status: Secondary | ICD-10-CM

## 2018-12-16 DIAGNOSIS — E870 Hyperosmolality and hypernatremia: Secondary | ICD-10-CM | POA: Diagnosis not present

## 2018-12-16 DIAGNOSIS — R197 Diarrhea, unspecified: Secondary | ICD-10-CM | POA: Diagnosis not present

## 2018-12-16 DIAGNOSIS — J9601 Acute respiratory failure with hypoxia: Secondary | ICD-10-CM | POA: Diagnosis not present

## 2018-12-16 DIAGNOSIS — I9589 Other hypotension: Secondary | ICD-10-CM | POA: Diagnosis not present

## 2018-12-16 DIAGNOSIS — F1410 Cocaine abuse, uncomplicated: Secondary | ICD-10-CM | POA: Diagnosis present

## 2018-12-16 DIAGNOSIS — Z79891 Long term (current) use of opiate analgesic: Secondary | ICD-10-CM

## 2018-12-16 DIAGNOSIS — R451 Restlessness and agitation: Secondary | ICD-10-CM | POA: Diagnosis not present

## 2018-12-16 DIAGNOSIS — Z7189 Other specified counseling: Secondary | ICD-10-CM | POA: Diagnosis not present

## 2018-12-16 DIAGNOSIS — N179 Acute kidney failure, unspecified: Secondary | ICD-10-CM | POA: Diagnosis present

## 2018-12-16 DIAGNOSIS — G92 Toxic encephalopathy: Secondary | ICD-10-CM | POA: Diagnosis present

## 2018-12-16 DIAGNOSIS — J440 Chronic obstructive pulmonary disease with acute lower respiratory infection: Secondary | ICD-10-CM | POA: Diagnosis present

## 2018-12-16 DIAGNOSIS — Z888 Allergy status to other drugs, medicaments and biological substances status: Secondary | ICD-10-CM

## 2018-12-16 DIAGNOSIS — T405X1A Poisoning by cocaine, accidental (unintentional), initial encounter: Secondary | ICD-10-CM | POA: Diagnosis present

## 2018-12-16 DIAGNOSIS — I739 Peripheral vascular disease, unspecified: Secondary | ICD-10-CM | POA: Diagnosis present

## 2018-12-16 DIAGNOSIS — R0603 Acute respiratory distress: Secondary | ICD-10-CM

## 2018-12-16 DIAGNOSIS — R7989 Other specified abnormal findings of blood chemistry: Secondary | ICD-10-CM

## 2018-12-16 DIAGNOSIS — F1020 Alcohol dependence, uncomplicated: Secondary | ICD-10-CM | POA: Diagnosis present

## 2018-12-16 DIAGNOSIS — H518 Other specified disorders of binocular movement: Secondary | ICD-10-CM | POA: Diagnosis not present

## 2018-12-16 DIAGNOSIS — R059 Cough, unspecified: Secondary | ICD-10-CM

## 2018-12-16 DIAGNOSIS — F10931 Alcohol use, unspecified with withdrawal delirium: Secondary | ICD-10-CM

## 2018-12-16 DIAGNOSIS — Z931 Gastrostomy status: Secondary | ICD-10-CM

## 2018-12-16 DIAGNOSIS — Z7951 Long term (current) use of inhaled steroids: Secondary | ICD-10-CM

## 2018-12-16 DIAGNOSIS — T17908A Unspecified foreign body in respiratory tract, part unspecified causing other injury, initial encounter: Secondary | ICD-10-CM

## 2018-12-16 DIAGNOSIS — Z7982 Long term (current) use of aspirin: Secondary | ICD-10-CM

## 2018-12-16 DIAGNOSIS — K567 Ileus, unspecified: Secondary | ICD-10-CM | POA: Diagnosis not present

## 2018-12-16 DIAGNOSIS — G929 Unspecified toxic encephalopathy: Secondary | ICD-10-CM | POA: Diagnosis present

## 2018-12-16 DIAGNOSIS — I10 Essential (primary) hypertension: Secondary | ICD-10-CM

## 2018-12-16 DIAGNOSIS — Z8711 Personal history of peptic ulcer disease: Secondary | ICD-10-CM

## 2018-12-16 DIAGNOSIS — G8929 Other chronic pain: Secondary | ICD-10-CM | POA: Diagnosis present

## 2018-12-16 DIAGNOSIS — E512 Wernicke's encephalopathy: Secondary | ICD-10-CM | POA: Diagnosis present

## 2018-12-16 DIAGNOSIS — Z915 Personal history of self-harm: Secondary | ICD-10-CM

## 2018-12-16 DIAGNOSIS — J80 Acute respiratory distress syndrome: Secondary | ICD-10-CM | POA: Diagnosis not present

## 2018-12-16 DIAGNOSIS — Z79899 Other long term (current) drug therapy: Secondary | ICD-10-CM

## 2018-12-16 DIAGNOSIS — F1220 Cannabis dependence, uncomplicated: Secondary | ICD-10-CM | POA: Diagnosis present

## 2018-12-16 DIAGNOSIS — R001 Bradycardia, unspecified: Secondary | ICD-10-CM | POA: Diagnosis not present

## 2018-12-16 DIAGNOSIS — Z9289 Personal history of other medical treatment: Secondary | ICD-10-CM

## 2018-12-16 DIAGNOSIS — I5033 Acute on chronic diastolic (congestive) heart failure: Secondary | ICD-10-CM | POA: Diagnosis present

## 2018-12-16 DIAGNOSIS — J13 Pneumonia due to Streptococcus pneumoniae: Secondary | ICD-10-CM | POA: Diagnosis present

## 2018-12-16 DIAGNOSIS — R569 Unspecified convulsions: Secondary | ICD-10-CM | POA: Diagnosis not present

## 2018-12-16 LAB — POCT I-STAT 7, (LYTES, BLD GAS, ICA,H+H)
Acid-Base Excess: 5 mmol/L — ABNORMAL HIGH (ref 0.0–2.0)
Bicarbonate: 27.9 mmol/L (ref 20.0–28.0)
Calcium, Ion: 1.16 mmol/L (ref 1.15–1.40)
HCT: 34 % — ABNORMAL LOW (ref 39.0–52.0)
Hemoglobin: 11.6 g/dL — ABNORMAL LOW (ref 13.0–17.0)
O2 Saturation: 95 %
Patient temperature: 99
Potassium: 3.7 mmol/L (ref 3.5–5.1)
Sodium: 138 mmol/L (ref 135–145)
TCO2: 29 mmol/L (ref 22–32)
pCO2 arterial: 33.2 mmHg (ref 32.0–48.0)
pH, Arterial: 7.533 — ABNORMAL HIGH (ref 7.350–7.450)
pO2, Arterial: 65 mmHg — ABNORMAL LOW (ref 83.0–108.0)

## 2018-12-16 LAB — URINALYSIS, ROUTINE W REFLEX MICROSCOPIC
Bilirubin Urine: NEGATIVE
Glucose, UA: NEGATIVE mg/dL
Hgb urine dipstick: NEGATIVE
Ketones, ur: NEGATIVE mg/dL
Leukocytes, UA: NEGATIVE
Nitrite: NEGATIVE
Protein, ur: NEGATIVE mg/dL
Specific Gravity, Urine: 1.014 (ref 1.005–1.030)
pH: 8 (ref 5.0–8.0)

## 2018-12-16 LAB — BLOOD GAS, ARTERIAL
Acid-Base Excess: 4.5 mmol/L — ABNORMAL HIGH (ref 0.0–2.0)
Bicarbonate: 27.5 mmol/L (ref 20.0–28.0)
FIO2: 100
MECHVT: 560 mL
O2 Saturation: 100 %
PEEP: 5 cmH2O
Patient temperature: 37
RATE: 18 resp/min
pCO2 arterial: 55.4 mmHg — ABNORMAL HIGH (ref 32.0–48.0)
pH, Arterial: 7.35 (ref 7.350–7.450)
pO2, Arterial: 521 mmHg — ABNORMAL HIGH (ref 83.0–108.0)

## 2018-12-16 LAB — CBC WITH DIFFERENTIAL/PLATELET
Abs Immature Granulocytes: 0.37 10*3/uL — ABNORMAL HIGH (ref 0.00–0.07)
Basophils Absolute: 0.1 10*3/uL (ref 0.0–0.1)
Basophils Relative: 1 %
Eosinophils Absolute: 0.1 10*3/uL (ref 0.0–0.5)
Eosinophils Relative: 1 %
HCT: 46.8 % (ref 39.0–52.0)
Hemoglobin: 15.4 g/dL (ref 13.0–17.0)
Immature Granulocytes: 4 %
Lymphocytes Relative: 16 %
Lymphs Abs: 1.7 10*3/uL (ref 0.7–4.0)
MCH: 30.1 pg (ref 26.0–34.0)
MCHC: 32.9 g/dL (ref 30.0–36.0)
MCV: 91.4 fL (ref 80.0–100.0)
Monocytes Absolute: 0.8 10*3/uL (ref 0.1–1.0)
Monocytes Relative: 8 %
Neutro Abs: 7.3 10*3/uL (ref 1.7–7.7)
Neutrophils Relative %: 70 %
Platelets: 222 10*3/uL (ref 150–400)
RBC: 5.12 MIL/uL (ref 4.22–5.81)
RDW: 12.5 % (ref 11.5–15.5)
WBC: 10.2 10*3/uL (ref 4.0–10.5)
nRBC: 0 % (ref 0.0–0.2)

## 2018-12-16 LAB — COMPREHENSIVE METABOLIC PANEL
ALT: 15 U/L (ref 0–44)
ALT: 12 U/L (ref 0–44)
AST: 18 U/L (ref 15–41)
AST: 20 U/L (ref 15–41)
Albumin: 3.5 g/dL (ref 3.5–5.0)
Albumin: 2.7 g/dL — ABNORMAL LOW (ref 3.5–5.0)
Alkaline Phosphatase: 64 U/L (ref 38–126)
Alkaline Phosphatase: 49 U/L (ref 38–126)
Anion gap: 8 (ref 5–15)
Anion gap: 7 (ref 5–15)
BUN: 12 mg/dL (ref 6–20)
BUN: 9 mg/dL (ref 6–20)
CO2: 31 mmol/L (ref 22–32)
CO2: 25 mmol/L (ref 22–32)
Calcium: 8.8 mg/dL — ABNORMAL LOW (ref 8.9–10.3)
Calcium: 7.9 mg/dL — ABNORMAL LOW (ref 8.9–10.3)
Chloride: 99 mmol/L (ref 98–111)
Chloride: 107 mmol/L (ref 98–111)
Creatinine, Ser: 0.89 mg/dL (ref 0.61–1.24)
Creatinine, Ser: 0.75 mg/dL (ref 0.61–1.24)
GFR calc Af Amer: >60 mL/min (ref >60)
GFR calc Af Amer: >60 mL/min (ref >60)
GFR calc non Af Amer: >60 mL/min (ref >60)
GFR calc non Af Amer: >60 mL/min (ref >60)
Glucose, Bld: 104 mg/dL — ABNORMAL HIGH (ref 70–99)
Glucose, Bld: 83 mg/dL (ref 70–99)
Potassium: 4.3 mmol/L (ref 3.5–5.1)
Potassium: 3.5 mmol/L (ref 3.5–5.1)
Sodium: 138 mmol/L (ref 135–145)
Sodium: 139 mmol/L (ref 135–145)
Total Bilirubin: 0.6 mg/dL (ref 0.3–1.2)
Total Bilirubin: 0.6 mg/dL (ref 0.3–1.2)
Total Protein: 6.5 g/dL (ref 6.5–8.1)
Total Protein: 4.6 g/dL — ABNORMAL LOW (ref 6.5–8.1)

## 2018-12-16 LAB — PROTIME-INR
INR: 0.91
INR: 1.08
Prothrombin Time: 12.2 seconds (ref 11.4–15.2)
Prothrombin Time: 13.9 seconds (ref 11.4–15.2)

## 2018-12-16 LAB — AMMONIA: Ammonia: 274 umol/L — ABNORMAL HIGH (ref 9–35)

## 2018-12-16 LAB — TROPONIN I: Troponin I: <0.03 ng/mL (ref <0.03)

## 2018-12-16 LAB — LIPASE, BLOOD: Lipase: 25 U/L (ref 11–51)

## 2018-12-16 LAB — CBC
HCT: 37.3 % — ABNORMAL LOW (ref 39.0–52.0)
Hemoglobin: 11.7 g/dL — ABNORMAL LOW (ref 13.0–17.0)
MCH: 28.5 pg (ref 26.0–34.0)
MCHC: 31.4 g/dL (ref 30.0–36.0)
MCV: 90.8 fL (ref 80.0–100.0)
Platelets: 184 10*3/uL (ref 150–400)
RBC: 4.11 MIL/uL — ABNORMAL LOW (ref 4.22–5.81)
RDW: 13 % (ref 11.5–15.5)
WBC: 7.4 10*3/uL (ref 4.0–10.5)
nRBC: 0 % (ref 0.0–0.2)

## 2018-12-16 LAB — ETHANOL: Alcohol, Ethyl (B): <10 mg/dL (ref <10)

## 2018-12-16 LAB — RAPID URINE DRUG SCREEN, HOSP PERFORMED
Amphetamines: NOT DETECTED
Barbiturates: NOT DETECTED
Benzodiazepines: NOT DETECTED
Cocaine: POSITIVE — AB
Opiates: NOT DETECTED
Tetrahydrocannabinol: POSITIVE — AB

## 2018-12-16 LAB — APTT: aPTT: 31 seconds (ref 24–36)

## 2018-12-16 LAB — CBG MONITORING, ED: Glucose-Capillary: 78 mg/dL (ref 70–99)

## 2018-12-16 LAB — GLUCOSE, CAPILLARY
Glucose-Capillary: 96 mg/dL (ref 70–99)
Glucose-Capillary: 91 mg/dL (ref 70–99)
Glucose-Capillary: 101 mg/dL — ABNORMAL HIGH (ref 70–99)

## 2018-12-16 LAB — STREP PNEUMONIAE URINARY ANTIGEN: Strep Pneumo Urinary Antigen: NEGATIVE

## 2018-12-16 LAB — ACETAMINOPHEN LEVEL: Acetaminophen (Tylenol), Serum: <10 ug/mL — ABNORMAL LOW (ref 10–30)

## 2018-12-16 LAB — TSH: TSH: 4.405 u[IU]/mL (ref 0.350–4.500)

## 2018-12-16 LAB — SALICYLATE LEVEL: Salicylate Lvl: <7 mg/dL (ref 2.8–30.0)

## 2018-12-16 LAB — VALPROIC ACID LEVEL: Valproic Acid Lvl: 89 ug/mL (ref 50.0–100.0)

## 2018-12-16 LAB — PROCALCITONIN: Procalcitonin: <0.1 ng/mL

## 2018-12-16 LAB — LACTIC ACID, PLASMA: Lactic Acid, Venous: 1.4 mmol/L (ref 0.5–1.9)

## 2018-12-16 LAB — CORTISOL: Cortisol, Plasma: 5.3 ug/dL

## 2018-12-16 LAB — MRSA PCR SCREENING: MRSA by PCR: NEGATIVE

## 2018-12-16 LAB — MAGNESIUM: Magnesium: 2.1 mg/dL (ref 1.7–2.4)

## 2018-12-16 LAB — PHOSPHORUS: Phosphorus: 3.4 mg/dL (ref 2.5–4.6)

## 2018-12-16 LAB — VITAMIN B12: Vitamin B-12: 221 pg/mL (ref 180–914)

## 2018-12-16 LAB — AMYLASE: Amylase: 35 U/L (ref 28–100)

## 2018-12-16 LAB — BETA-HYDROXYBUTYRIC ACID: Beta-Hydroxybutyric Acid: <0.05 mmol/L — ABNORMAL LOW (ref 0.05–0.27)

## 2018-12-16 MED ORDER — MOMETASONE FURO-FORMOTEROL FUM 200-5 MCG/ACT IN AERO
2.0000 | INHALATION_SPRAY | Freq: Two times a day (BID) | RESPIRATORY_TRACT | Status: DC
  Filled 2018-12-16: qty 8.8

## 2018-12-16 MED ORDER — FENTANYL CITRATE (PF) 100 MCG/2ML IJ SOLN: 50.0000 ug | Freq: Once | INTRAMUSCULAR | Status: DC

## 2018-12-16 MED ORDER — MIDAZOLAM HCL 2 MG/2ML IJ SOLN
2.0000 mg | INTRAMUSCULAR | Status: DC | PRN
  Filled 2018-12-16: qty 2

## 2018-12-16 MED ORDER — FENTANYL BOLUS VIA INFUSION: 50.0000 ug | INTRAVENOUS | Status: DC | PRN

## 2018-12-16 MED ORDER — ACETAMINOPHEN 325 MG PO TABS: 650.0000 mg | ORAL_TABLET | Freq: Four times a day (QID) | ORAL | Status: DC | PRN

## 2018-12-16 MED ORDER — CHLORHEXIDINE GLUCONATE 0.12% ORAL RINSE (MEDLINE KIT)
15.0000 mL | Freq: Two times a day (BID) | OROMUCOSAL | Status: DC
  Administered 2018-12-16 – 2018-12-17 (×3): 15 mL via OROMUCOSAL

## 2018-12-16 MED ORDER — VITAL AF 1.2 CAL PO LIQD
1000.0000 mL | ORAL | Status: DC
  Administered 2018-12-16 (×2): 1000 mL

## 2018-12-16 MED ORDER — HALOPERIDOL LACTATE 5 MG/ML IJ SOLN: 5.0000 mg | Freq: Once | INTRAMUSCULAR | Status: DC

## 2018-12-16 MED ORDER — FENTANYL CITRATE (PF) 100 MCG/2ML IJ SOLN
200.0000 ug | Freq: Once | INTRAMUSCULAR | Status: AC
  Administered 2018-12-16: 200 ug via INTRAVENOUS

## 2018-12-16 MED ORDER — SODIUM CHLORIDE 0.9 % IV SOLN
INTRAVENOUS | Status: AC
  Administered 2018-12-16: 05:00:00 via INTRAVENOUS

## 2018-12-16 MED ORDER — SODIUM CHLORIDE 0.9 % IV BOLUS
1000.0000 mL | Freq: Once | INTRAVENOUS | Status: AC
  Administered 2018-12-16: 1000 mL via INTRAVENOUS

## 2018-12-16 MED ORDER — PROPOFOL 1000 MG/100ML IV EMUL
INTRAVENOUS | Status: AC
  Administered 2018-12-16: 10 ug/kg/min via INTRAVENOUS
  Filled 2018-12-16: qty 100

## 2018-12-16 MED ORDER — PROPOFOL 1000 MG/100ML IV EMUL
5.0000 ug/kg/min | INTRAVENOUS | Status: DC
  Administered 2018-12-16 (×2): 10 ug/kg/min via INTRAVENOUS

## 2018-12-16 MED ORDER — SUCCINYLCHOLINE CHLORIDE 20 MG/ML IJ SOLN
150.0000 mg | Freq: Once | INTRAMUSCULAR | Status: AC
  Administered 2018-12-16: 150 mg via INTRAVENOUS

## 2018-12-16 MED ORDER — HALOPERIDOL LACTATE 5 MG/ML IJ SOLN
INTRAMUSCULAR | Status: AC
  Filled 2018-12-16: qty 1

## 2018-12-16 MED ORDER — HALOPERIDOL LACTATE 5 MG/ML IJ SOLN
5.0000 mg | Freq: Once | INTRAMUSCULAR | Status: AC
  Administered 2018-12-16: 5 mg via INTRAVENOUS

## 2018-12-16 MED ORDER — THIAMINE HCL 100 MG/ML IJ SOLN
100.0000 mg | Freq: Every day | INTRAMUSCULAR | Status: DC
  Administered 2018-12-16 – 2018-12-18 (×3): 100 mg via INTRAVENOUS
  Filled 2018-12-16 (×3): qty 2

## 2018-12-16 MED ORDER — HALOPERIDOL LACTATE 5 MG/ML IJ SOLN
INTRAMUSCULAR | Status: AC
  Administered 2018-12-16: 03:00:00
  Filled 2018-12-16: qty 1

## 2018-12-16 MED ORDER — ENOXAPARIN SODIUM 40 MG/0.4ML ~~LOC~~ SOLN
40.0000 mg | SUBCUTANEOUS | Status: DC
  Administered 2018-12-17 – 2019-01-08 (×23): 40 mg via SUBCUTANEOUS
  Filled 2018-12-16 (×24): qty 0.4

## 2018-12-16 MED ORDER — PROPOFOL 1000 MG/100ML IV EMUL: 5.0000 ug/kg/min | INTRAVENOUS | Status: DC

## 2018-12-16 MED ORDER — ONDANSETRON HCL 4 MG/2ML IJ SOLN
4.0000 mg | Freq: Four times a day (QID) | INTRAMUSCULAR | Status: DC | PRN
  Administered 2018-12-23: 4 mg via INTRAVENOUS
  Filled 2018-12-16: qty 2

## 2018-12-16 MED ORDER — SENNOSIDES 8.8 MG/5ML PO SYRP
5.0000 mL | ORAL_SOLUTION | Freq: Two times a day (BID) | ORAL | Status: DC | PRN
  Filled 2018-12-16: qty 5

## 2018-12-16 MED ORDER — SODIUM CHLORIDE 0.9 % IV SOLN: Freq: Once | INTRAVENOUS | Status: DC

## 2018-12-16 MED ORDER — ORAL CARE MOUTH RINSE
15.0000 mL | OROMUCOSAL | Status: DC
  Administered 2018-12-16 – 2018-12-17 (×8): 15 mL via OROMUCOSAL

## 2018-12-16 MED ORDER — IPRATROPIUM-ALBUTEROL 0.5-2.5 (3) MG/3ML IN SOLN
3.0000 mL | Freq: Four times a day (QID) | RESPIRATORY_TRACT | Status: DC
  Administered 2018-12-16 – 2018-12-30 (×54): 3 mL via RESPIRATORY_TRACT
  Filled 2018-12-16 (×54): qty 3

## 2018-12-16 MED ORDER — DEXTROSE-NACL 5-0.45 % IV SOLN
INTRAVENOUS | Status: DC
  Administered 2018-12-16 – 2018-12-18 (×4): via INTRAVENOUS

## 2018-12-16 MED ORDER — ENOXAPARIN SODIUM 40 MG/0.4ML ~~LOC~~ SOLN: 40.0000 mg | SUBCUTANEOUS | Status: DC

## 2018-12-16 MED ORDER — ALBUTEROL SULFATE (2.5 MG/3ML) 0.083% IN NEBU
2.5000 mg | INHALATION_SOLUTION | RESPIRATORY_TRACT | Status: DC | PRN
  Administered 2018-12-17 – 2018-12-18 (×3): 2.5 mg via RESPIRATORY_TRACT
  Filled 2018-12-16 (×3): qty 3

## 2018-12-16 MED ORDER — FENTANYL 2500MCG IN NS 250ML (10MCG/ML) PREMIX INFUSION: 25.0000 ug/h | INTRAVENOUS | Status: DC

## 2018-12-16 MED ORDER — SODIUM CHLORIDE 0.9% FLUSH
3.0000 mL | Freq: Two times a day (BID) | INTRAVENOUS | Status: DC
  Administered 2018-12-17 – 2019-01-09 (×26): 3 mL via INTRAVENOUS

## 2018-12-16 MED ORDER — IPRATROPIUM BROMIDE 0.02 % IN SOLN: 0.5000 mg | Freq: Three times a day (TID) | RESPIRATORY_TRACT | Status: DC

## 2018-12-16 MED ORDER — INSULIN ASPART 100 UNIT/ML ~~LOC~~ SOLN: 4.0000 [IU] | SUBCUTANEOUS | Status: DC

## 2018-12-16 MED ORDER — LACTULOSE 10 GM/15ML PO SOLN
30.0000 g | Freq: Three times a day (TID) | ORAL | Status: DC
  Administered 2018-12-16: 30 g
  Filled 2018-12-16 (×2): qty 45

## 2018-12-16 MED ORDER — HALOPERIDOL LACTATE 5 MG/ML IJ SOLN
5.0000 mg | Freq: Once | INTRAMUSCULAR | Status: AC
  Administered 2018-12-16: 5 mg via INTRAVENOUS
  Filled 2018-12-16: qty 1

## 2018-12-16 MED ORDER — FENTANYL BOLUS VIA INFUSION
50.0000 ug | INTRAVENOUS | Status: DC | PRN
  Administered 2018-12-17 – 2018-12-28 (×19): 50 ug via INTRAVENOUS
  Filled 2018-12-16: qty 50

## 2018-12-16 MED ORDER — SODIUM CHLORIDE 0.9 % IV SOLN
INTRAVENOUS | Status: DC
  Administered 2018-12-16 (×2): via INTRAVENOUS

## 2018-12-16 MED ORDER — FENTANYL CITRATE (PF) 100 MCG/2ML IJ SOLN
50.0000 ug | Freq: Once | INTRAMUSCULAR | Status: AC
  Administered 2018-12-16: 50 ug via INTRAVENOUS

## 2018-12-16 MED ORDER — FENTANYL CITRATE (PF) 100 MCG/2ML IJ SOLN
100.0000 ug | INTRAMUSCULAR | Status: DC | PRN
  Administered 2018-12-17 – 2018-12-24 (×9): 100 ug via INTRAVENOUS
  Filled 2018-12-16 (×10): qty 2

## 2018-12-16 MED ORDER — MIDAZOLAM HCL 2 MG/2ML IJ SOLN: 2.0000 mg | INTRAMUSCULAR | Status: DC | PRN

## 2018-12-16 MED ORDER — ENOXAPARIN SODIUM 40 MG/0.4ML ~~LOC~~ SOLN
40.0000 mg | SUBCUTANEOUS | Status: DC
  Administered 2018-12-16: 40 mg via SUBCUTANEOUS
  Filled 2018-12-16: qty 0.4

## 2018-12-16 MED ORDER — FENTANYL CITRATE (PF) 100 MCG/2ML IJ SOLN
100.0000 ug | INTRAMUSCULAR | Status: DC | PRN
  Administered 2018-12-16: 100 ug via INTRAVENOUS
  Filled 2018-12-16 (×2): qty 2

## 2018-12-16 MED ORDER — FENTANYL CITRATE (PF) 100 MCG/2ML IJ SOLN
INTRAMUSCULAR | Status: AC
  Filled 2018-12-16: qty 4

## 2018-12-16 MED ORDER — HALOPERIDOL LACTATE 5 MG/ML IJ SOLN
INTRAMUSCULAR | Status: AC
  Administered 2018-12-16: 5 mg via INTRAVENOUS
  Filled 2018-12-16: qty 1

## 2018-12-16 MED ORDER — ONDANSETRON HCL 4 MG PO TABS: 4.0000 mg | ORAL_TABLET | Freq: Four times a day (QID) | ORAL | Status: DC | PRN

## 2018-12-16 MED ORDER — POTASSIUM CHLORIDE 20 MEQ/15ML (10%) PO SOLN
40.0000 meq | Freq: Once | ORAL | Status: AC
  Administered 2018-12-16: 40 meq via ORAL
  Filled 2018-12-16: qty 30

## 2018-12-16 MED ORDER — FOLIC ACID 5 MG/ML IJ SOLN
1.0000 mg | Freq: Every day | INTRAMUSCULAR | Status: DC
  Administered 2018-12-16 – 2018-12-18 (×3): 1 mg via INTRAVENOUS
  Filled 2018-12-16 (×5): qty 0.2

## 2018-12-16 MED ORDER — FAMOTIDINE IN NACL 20-0.9 MG/50ML-% IV SOLN: 20.0000 mg | Freq: Two times a day (BID) | INTRAVENOUS | Status: DC

## 2018-12-16 MED ORDER — ACETAMINOPHEN 650 MG RE SUPP: 650.0000 mg | Freq: Four times a day (QID) | RECTAL | Status: DC | PRN

## 2018-12-16 MED ORDER — PANTOPRAZOLE SODIUM 40 MG IV SOLR
40.0000 mg | Freq: Every day | INTRAVENOUS | Status: DC
  Administered 2018-12-16 – 2018-12-17 (×2): 40 mg via INTRAVENOUS
  Filled 2018-12-16 (×2): qty 40

## 2018-12-16 MED ORDER — FENTANYL 2500MCG IN NS 250ML (10MCG/ML) PREMIX INFUSION
0.0000 ug/h | INTRAVENOUS | Status: DC
  Administered 2018-12-16: 25 ug/h via INTRAVENOUS

## 2018-12-16 MED ORDER — PRO-STAT SUGAR FREE PO LIQD: 30.0000 mL | Freq: Two times a day (BID) | ORAL | Status: DC

## 2018-12-16 MED ORDER — FENTANYL 2500MCG IN NS 250ML (10MCG/ML) PREMIX INFUSION
INTRAVENOUS | Status: AC
  Filled 2018-12-16: qty 250

## 2018-12-16 MED ORDER — ETOMIDATE 2 MG/ML IV SOLN
20.0000 mg | Freq: Once | INTRAVENOUS | Status: AC
  Administered 2018-12-16: 20 mg via INTRAVENOUS

## 2018-12-16 MED ORDER — DEXMEDETOMIDINE HCL IN NACL 400 MCG/100ML IV SOLN
0.0000 ug/kg/h | INTRAVENOUS | Status: DC
  Administered 2018-12-16: 0.4 ug/kg/h via INTRAVENOUS
  Administered 2018-12-17: 0.8 ug/kg/h via INTRAVENOUS
  Administered 2018-12-17: 1 ug/kg/h via INTRAVENOUS
  Filled 2018-12-16 (×3): qty 100

## 2018-12-16 MED ORDER — IPRATROPIUM-ALBUTEROL 0.5-2.5 (3) MG/3ML IN SOLN
RESPIRATORY_TRACT | Status: AC
  Administered 2018-12-16: 3 mL via RESPIRATORY_TRACT
  Filled 2018-12-16: qty 3

## 2018-12-16 NOTE — Progress Notes (Signed)
Patient transported from ED 16 to CT and back without any complications.

## 2018-12-16 NOTE — Progress Notes (Signed)
Wasted 150cc of fentanyl in med container with [employer]. Tamsen Meek, RN 12/16/2018 6:17 PM

## 2018-12-16 NOTE — Progress Notes (Signed)
Initial Nutrition Assessment  DOCUMENTATION CODES:   Not applicable  INTERVENTION:   Begin TF via OGT:  Vital AF 1.2 at 60 ml/h (1440 ml per day)  Provides 1728 kcal, 108 gm protein, 1168 ml free water daily  Monitor magnesium, potassium, and phosphorus daily for at least 3 days, MD to replete as needed, as pt is at risk for refeeding syndrome given history of alcohol abuse.  NUTRITION DIAGNOSIS:   Inadequate oral intake related to inability to eat as evidenced by NPO status.  GOAL:   Patient will meet greater than or equal to 90% of their needs  MONITOR:   Vent status, TF tolerance, Labs, I & O's  REASON FOR ASSESSMENT:   Ventilator, Consult Enteral/tube feeding initiation and management  ASSESSMENT:   58 yo male with PMH of [name], bipolar d/o, alcohol & marajuana abuse, [name], COPD, HTN who was admitted AMS, elevated ammonia level requiring intubation on admission.  Received MD Consult for TF initiation and management. OGT in place.   Patient is currently intubated on ventilator support MV: 7.8 L/min Temp (24hrs), Avg:98.5 F (36.9 C), Min:97.9 F (36.6 C), Max:99 F (37.2 C)   Labs reviewed. Ammonia 274 (H) Medications reviewed and include folic acid, lactulose, thiamine.     NUTRITION - FOCUSED PHYSICAL EXAM:    Most Recent Value  Orbital Region  No depletion  Upper Arm Region  No depletion  Thoracic and Lumbar Region  No depletion  Buccal Region  No depletion  Temple Region  No depletion  Clavicle Bone Region  No depletion  Clavicle and Acromion Bone Region  No depletion  Scapular Bone Region  Unable to assess  Dorsal Hand  No depletion  Patellar Region  No depletion  Anterior Thigh Region  No depletion  Posterior Calf Region  No depletion  Edema (RD Assessment)  Mild  Hair  Reviewed  Eyes  Unable to assess  Mouth  Unable to assess  Skin  Reviewed  Nails  Reviewed       Diet Order:   Diet Order            Diet NPO time specified  Diet  effective now              EDUCATION NEEDS:   No education needs have been identified at this time  Skin:  Skin Assessment: Reviewed RN Assessment  Last BM:  PTA  Height:   Ht Readings from Last 1 Encounters:  12/16/18 5\' 9"  (1.753 m)    Weight:   Wt Readings from Last 1 Encounters:  12/16/18 69.7 kg    Ideal Body Weight:  72.7 kg  BMI:  Body mass index is 22.69 kg/m.  Estimated Nutritional Needs:   Kcal:  1700  Protein:  100-115 gm  Fluid:  >/= 1.7 L    Joaquin Courts, RD, LDN, CNSC Pager [phone] After Hours Pager [phone]

## 2018-12-16 NOTE — H&P (Addendum)
NAME:  Jeffrey [name], MRN:  [id number], DOB:  1961-04-12, LOS: 0 ADMISSION DATE:  12/16/2018,  REFERRING MD: [facility] emergency department physician, CHIEF COMPLAINT: Altered mental status, elevated ammonia level  Brief History   58 year old presents with altered mental status, chronic alcohol drug abuse, bipolar disorder.  Was refractory to sedation and required intubation.  History of present illness   58 year old male who presented to [facility] with altered mental status and became combative.  He was refractory to sedation and required intubation and mechanical ventilatory support.  Is noted to have ammonia level of 274.  He is noted to be a lifelong smoker.  Noted to be a lifelong drinker.  Also positive for marijuana. Following with intubations there were no intensive care unit beds available at [facility] he was transferred to [facility] for further evaluation and treatment.  There is some concern of Depakote poisoning as the cause of his liver impairment.   Past Medical History  COPD with chronic tobacco abuse Bipolar Polysubstance abuse  Significant Hospital Events   12/16/2018 intubated  Consults:    Procedures:  12/16/2018 intubation>>  Significant Diagnostic Tests:  12/16/2018 CT the head negative  Micro Data:    Antimicrobials:    Interim history/subjective:  58 year old with chronic EtOH abuse marijuana use who presents with agitation became combative sedated and then required mechanical ventilatory support  Objective   Blood pressure 103/61, pulse 64, temperature 97.9 F (36.6 C), temperature source Axillary, resp. rate 20, height 5\' 9"  (1.753 m), weight 85 kg, SpO2 100 %.    Vent Mode: PRVC FiO2 (%):  [40 %-100 %] 40 % Set Rate:  [18 bmp-20 bmp] 20 bmp Vt Set:  [500 mL-560 mL] 560 mL PEEP:  [5 cmH20] 5 cmH20 Plateau Pressure:  [13 cmH20-21 cmH20] 21 cmH20   Intake/Output Summary (Last 24 hours) at 12/16/2018  1115 Last data filed at 12/16/2018 4098 Gross per 24 hour  Intake -  Output 50 ml  Net -50 ml   Filed Weights   12/16/18 0315  Weight: 85 kg    Examination: General: Ill-appearing male sedated on full mechanical ventilatory support HENT: Endotracheal tube connected to ventilator, no JVD lymphadenopathy is appreciated.  Pupils equal reactive to light. Lungs: Coarse rhonchi bilaterally with expiratory wheezing Cardiovascular: Heart sounds are regular regular rate and rhythm, hemodynamically stable Abdomen: Soft nontender positive bowel sounds Extremities: No edema, warm Neuro: Sedated on fentanyl recently on propofol drip. GU: Foley in place with amber urine.  Resolved Hospital Problem list     Assessment & Plan:  Vent dependent respiratory failure multifactorial in nature.  Altered mental status secondary to EtOH abuse coupled with bipolar disease and inability to adequately sedate.  COPD chronic tobacco abuse.  Required intubation independent hospital transferred to [facility]. Vent bundle Serial chest x-rays while intubated Bronchodilators Wean FiO2 as tolerated Sedate for vent tolerance with RASS score of -1  Encephalopathic Secondary to polysubstance abuse  Elevated ammonia level 274 Suspected liver disease CT of the head 12/16/2018- was negative Monitor ammonia Sedation as need Hepatic work-up Check Depakote levels  Bipolar disorder, noted to be on Depakote, Zyprexa, Prozac, trazodone Hold bipolar medications at this time Check Depakote level    Best practice:  Diet: N.p.o. Pain/Anxiety/Delirium protocol (if indicated): Sedation protocol VAP protocol (if indicated): Yes DVT prophylaxis: Lovenox GI prophylaxis: Protonix Glucose control: As needed Mobility: Bedrest Code Status: Full Family Communication: 12/16/2018 no family at bedside at time  of admission Disposition:   Labs   CBC: Recent Labs  Lab 12/16/18 0107  WBC 10.2  NEUTROABS 7.3   HGB 15.4  HCT 46.8  MCV 91.4  PLT 222    Basic Metabolic Panel: Recent Labs  Lab 12/16/18 0107  NA 138  K 4.3  CL 99  CO2 31  GLUCOSE 104*  BUN 12  CREATININE 0.89  CALCIUM 8.8*   GFR: Estimated Creatinine Clearance: 97.8 mL/min (by C-G formula based on SCr of 0.89 mg/dL). Recent Labs  Lab 12/16/18 0107  WBC 10.2    Liver Function Tests: Recent Labs  Lab 12/16/18 0107  AST 18  ALT 15  ALKPHOS 64  BILITOT 0.6  PROT 6.5  ALBUMIN 3.5   No results for input(s): LIPASE, AMYLASE in the last 168 hours. Recent Labs  Lab 12/16/18 0107  AMMONIA 274*    ABG    Component Value Date/Time   PHART 7.350 12/16/2018 0409   PCO2ART 55.4 (H) 12/16/2018 0409   PO2ART 521 (H) 12/16/2018 0409   HCO3 27.5 12/16/2018 0409   O2SAT 100.0 12/16/2018 0409     Coagulation Profile: Recent Labs  Lab 12/16/18 0107  INR 0.91    Cardiac Enzymes: Recent Labs  Lab 12/16/18 0107  TROPONINI <0.03    HbA1C: No results found for: HGBA1C  CBG: Recent Labs  Lab 12/16/18 0746  GLUCAP 78    Review of Systems:   Not available secondary to altered mental status intubation.  Past Medical History  He,  has a past medical history of Acute blood loss anemia (02/07/2017), Anxiety, Arthritis, Bipolar disorder (HCC), Bipolar disorder (HCC), COPD, severe (HCC) (10/09/2016), Depression, Hyperlipidemia, Hypertension, Peptic ulcer disease, Pneumonia, [name] (peripheral vascular disease) (HCC) (06/18/2015), and Shortness of breath.   Surgical History    Past Surgical History:  Procedure Laterality Date  . BIOPSY  02/09/2017   Procedure: BIOPSY;  Surgeon: [name], MD;  Location: [facility];  Service: Endoscopy;;  duodenal gastric  . COLONOSCOPY  03/14/2007   NFA:OZHYQM colonoscopy and terminal ileoscopy except external hemorrhoids  . COLONOSCOPY N/A 05/05/2013   [name]: external/internal anal canal hemorrhoids, unable to intubate TI, segemental biopsies unremarkable   .  COLONOSCOPY N/A 04/11/2017   Procedure: COLONOSCOPY;  Surgeon: [name], MD;  Location: [facility];  Service: Endoscopy;  Laterality: N/A;  . COLONOSCOPY WITH PROPOFOL N/A 03/31/2017   Procedure: COLONOSCOPY WITH PROPOFOL;  Surgeon: [name], MD;  Location: [facility];  Service: Endoscopy;  Laterality: N/A;  1:45pm  . ESOPHAGOGASTRODUODENOSCOPY  03/14/2007   [id number] antral gastritis with bulbar duodenitis/paucity to postbulbar duodenal folds and biopsy were benign with no evidence of villous atrophy.  . ESOPHAGOGASTRODUODENOSCOPY (EGD) WITH PROPOFOL N/A 02/09/2017   Procedure: ESOPHAGOGASTRODUODENOSCOPY (EGD) WITH PROPOFOL;  Surgeon: [name], MD;  [facility]: [facility];  Service: Endoscopy;  Laterality: N/A;  . GIVENS CAPSULE STUDY N/A 04/08/2017   Procedure: GIVENS CAPSULE STUDY;  Surgeon: [name], MD;  Location: [facility];  Service: Endoscopy;  Laterality: N/A;  . HAND SURGERY     left, secondary to self-inflicted laceration  . HEMORRHOID SURGERY N/A 04/14/2017   Procedure: HEMORRHOIDECTOMY;  Surgeon: [name], MD;  Location: [facility] ORS;  Service: General;  Laterality: N/A;  . SHOULDER SURGERY     right  . TOE SURGERY     left great toe , amputated-lawnmover accident     Social History   reports that he has been  smoking cigarettes. He started smoking about 47 years ago. He has a 40.00 pack-year smoking history. He has never used smokeless tobacco. He reports current drug use. Drug: Marijuana. He reports that he does not drink alcohol.   Family History   His family history includes Anxiety disorder in his daughter and son; Asthma in his brother; Breast cancer in his mother; Depression in his daughter and son; Emphysema in his maternal grandfather; Heart attack in his maternal aunt, maternal grandfather, maternal grandmother, maternal uncle, paternal aunt, paternal grandfather, paternal grandmother, and paternal uncle; Heart disease in his father.  There is no history of Colon cancer or Liver disease.   Allergies Allergies  Allergen Reactions  . Augmentin [Amoxicillin-Pot Clavulanate] Rash and Other (See Comments)    Has patient had a PCN reaction causing immediate rash, facial/tongue/throat swelling, SOB or lightheadedness with hypotension: No Has patient had a PCN reaction causing severe rash involving mucus membranes or skin necrosis: No Has patient had a PCN reaction that required hospitalization No Has patient had a PCN reaction occurring within the last 10 years: Yes If all of the above answers are "NO", then may proceed with Cephalosporin use.  Donivan Scull Inhibitors Hives     Home Medications  Prior to Admission medications   Medication Sig Start Date End Date Taking? Authorizing Provider  albuterol (PROAIR HFA) 108 (90 Base) MCG/ACT inhaler Inhale 2 puffs into the lungs every 4 (four) hours as needed for wheezing or shortness of breath. 07/01/18  Yes Leslye Peer, MD  albuterol (PROVENTIL) (2.5 MG/3ML) 0.083% nebulizer solution Take 3 mLs (2.5 mg total) by nebulization every 4 (four) hours as needed for shortness of breath. 02/02/17  Yes Roslynn Amble, MD  atorvastatin (LIPITOR) 20 MG tablet Take 1 tablet (20 mg total) by mouth daily. For high cholesterol 02/09/18  Yes Money, Gerlene Burdock, FNP  budesonide-formoterol (SYMBICORT) 160-4.5 MCG/ACT inhaler Inhale 2 puffs into the lungs 2 (two) times daily.   Yes [provider]  divalproex (DEPAKOTE ER) 500 MG 24 hr tablet Take 3 tablets (1,500 mg total) by mouth at bedtime. 11/28/18  Yes Myrlene Broker, MD  FLUoxetine (PROZAC) 40 MG capsule Take 1 capsule (40 mg total) by mouth daily. For mood control 11/28/18  Yes Myrlene Broker, MD  furosemide (LASIX) 20 MG tablet Take 1 tablet (20 mg total) by mouth daily. 06/18/18  Yes Vassie Loll, MD  gabapentin (NEURONTIN) 400 MG capsule Take 1 capsule (400 mg total) by mouth 3 (three) times daily. 11/28/18  Yes Myrlene Broker, MD    ipratropium (ATROVENT) 0.02 % nebulizer solution Take 2.5 mLs (0.5 mg total) by nebulization 3 (three) times daily. 01/25/18  Yes Parrett, Tammy S, NP  metoprolol tartrate (LOPRESSOR) 50 MG tablet Take 1 tablet (50 mg total) by mouth 2 (two) times daily. 02/08/18  Yes Money, Gerlene Burdock, FNP  OLANZapine (ZYPREXA) 10 MG tablet Take 1.5 tablets (15 mg total) by mouth at bedtime. For mood stability 11/28/18  Yes Myrlene Broker, MD  pantoprazole (PROTONIX) 20 MG tablet Take 1 tablet (20 mg total) by mouth daily. 06/17/18 06/17/19 Yes Vassie Loll, MD  potassium chloride SA (K-DUR,KLOR-CON) 20 MEQ tablet Take 1 tablet (20 mEq total) by mouth daily. 06/18/18  Yes Vassie Loll, MD  Tiotropium Bromide-Olodaterol (STIOLTO RESPIMAT) 2.5-2.5 MCG/ACT AERS Inhale 2 puffs into the lungs daily. 11/01/18  Yes Leslye Peer, MD  traZODone (DESYREL) 50 MG tablet Take 1 tablet (50 mg total) by mouth at  bedtime as needed for sleep. 11/28/18  Yes Myrlene Broker, MD  aspirin EC 81 MG tablet Take 81 mg by mouth every evening.     [provider]     Critical care time: 45 min    Brett Canales Minor ACNP [facility]ack PCCM Pager [phone] till 1 pm If no answer page [phone] 12/16/2018, 11:28 AM  Attending Note:  58 year old male with PMH of polysubstance abuse presenting to PCCM with respiratory failure from severe agitation then sedation resulting for need of control of said agitation.  The patient is currently sedated on exam, with clear lungs.  I reviewed CXR myself, ETT is in a good position.  Discussed with PCCM-NP.  Will continue fentanyl drip for now, add precedex drip.  Check ABG.  Adjust vent for ABG.  No need to adjust ETT at this time.  May need seroquel if patient becomes more agitated as I am unsure how he will behave as we decrease fentanyl.  Hold intubated or today.  PCCM will continue to manage.  The patient is critically ill with multiple organ systems failure and requires high complexity decision  making for assessment and support, frequent evaluation and titration of therapies, application of advanced monitoring technologies and extensive interpretation of multiple databases.   Critical Care Time devoted to patient care services described in this note is  45  Minutes. This time reflects time of care of this signee Dr Koren Bound. This critical care time does not reflect procedure time, or teaching time or supervisory time of PA/NP/Med student/Med Resident etc but could involve care discussion time.  Alyson Reedy, M.D. [facility] Pulmonary/Critical Care Medicine. Pager: [phone]. After hours pager: [phone].

## 2018-12-16 NOTE — Progress Notes (Signed)
Patient seen and examined. Admitted after midnight secondary to AMS/encephalopathy. Base on available work up found to be related to hepatic encephalopathy with ammonia of 274. Patient became significantly agitated and combative; making himself in danger to him and/or staff. After aggressive medication given in order to protect airways patient required to be intubated. VS soft while on propofol drip, but stable. No ICU beds available at [facility] hospital. Case discussed with Dr. Molli Knock at [facility] and patient would be transfer to his service at [facility] ICU. Please refer to H&P written by Dr. Antionette Char for further info/details on admission.    Vassie Loll MD [phone]

## 2018-12-16 NOTE — ED Provider Notes (Signed)
[facility] EMERGENCY DEPARTMENT Provider Note   CSN: [id number] Arrival date & time: 12/16/18  0055     History   Chief Complaint Chief Complaint  Patient presents with  . Altered Mental Status    HPI Jeffrey [name] is a 58 y.o. male.  Patient brought to the emergency department from home.  A friend apparently checked on him tonight and found him altered.  He was last seen normal 2 days ago.  EMS report that he has been somnolent during transport, slurred speech.  He will not answer questions.Level V Caveat due to status change.     Past Medical History:  Diagnosis Date  . Acute blood loss anemia 02/07/2017  . Anxiety   . Arthritis    deg disease, bulging disk,  shoulder level  . Bipolar disorder (HCC)   . Bipolar disorder (HCC)   . 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 Depression    anxiety  . Hyperlipidemia   . Hypertension   . Peptic ulcer disease    Review  . Pneumonia   . [name] (peripheral vascular disease) (HCC) 06/18/2015  . Shortness of breath     Patient Active Problem List   Diagnosis Date Noted  . COPD exacerbation (HCC) 09/16/2018  . Essential hypertension 09/16/2018  . GERD (gastroesophageal reflux disease) 09/16/2018  . Hypoxia   . Acute on chronic diastolic HF (heart failure) (HCC)   . Bipolar disorder with severe depression (HCC) 02/01/2018  . Alcohol use disorder, severe, dependence (HCC) 02/01/2018  . Cannabis use disorder, severe, dependence (HCC) 02/01/2018  . MDD (major depressive disorder), recurrent episode, severe (HCC) 01/31/2018  . Internal and external bleeding hemorrhoids   . Rectal bleeding   . Iron deficiency anemia due to chronic blood loss 03/09/2017  . Anemia   . GIB (gastrointestinal bleeding) 02/07/2017  . Acute blood loss anemia 02/07/2017  . Weakness generalized 02/07/2017  . Dizziness 02/07/2017  . Peptic ulcer disease   . Bipolar disorder (HCC)   . 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 Dyspnea 08/26/2016  . H/O: depression  08/26/2016  . Dyslipidemia 08/26/2016  . Tobacco use disorder 08/26/2016  . [name] (peripheral vascular disease) (HCC) 06/18/2015  . Chronic diarrhea 05/02/2013  . Hematochezia 05/02/2013  . Abnormal weight loss 05/02/2013  . Abdominal pain, periumbilical 05/02/2013    Past Surgical History:  Procedure Laterality Date  . BIOPSY  02/09/2017   Procedure: BIOPSY;  Surgeon: West BaliSandi L Fields, MD;  Location: [facility];  Service: Endoscopy;;  duodenal gastric  . COLONOSCOPY  03/14/2007   WUJ:WJXBJYR:Normal colonoscopy and terminal ileoscopy except external hemorrhoids  . COLONOSCOPY N/A 05/05/2013   Dr. Jena Gaussourk: external/internal anal canal hemorrhoids, unable to intubate TI, segemental biopsies unremarkable   . COLONOSCOPY N/A 04/11/2017   Procedure: COLONOSCOPY;  Surgeon: West BaliFields, Sandi L, MD;  Location: [facility];  Service: Endoscopy;  Laterality: N/A;  . COLONOSCOPY WITH PROPOFOL N/A 03/31/2017   Procedure: COLONOSCOPY WITH PROPOFOL;  Surgeon: Corbin Adeourk, Robert M, MD;  Location: [facility];  Service: Endoscopy;  Laterality: N/A;  1:45pm  . ESOPHAGOGASTRODUODENOSCOPY  03/14/2007   NWG:[id number]:Nonerosive antral gastritis with bulbar duodenitis/paucity to postbulbar duodenal folds and biopsy were benign with no evidence of villous atrophy.  . ESOPHAGOGASTRODUODENOSCOPY (EGD) WITH PROPOFOL N/A 02/09/2017   Procedure: ESOPHAGOGASTRODUODENOSCOPY (EGD) WITH PROPOFOL;  Surgeon: West BaliSandi L Fields, MD;  Location: [facility];  Service: Endoscopy;  Laterality: N/A;  . GIVENS CAPSULE STUDY N/A 04/08/2017   Procedure: GIVENS CAPSULE STUDY;  Surgeon: [name], MD;  Location: [facility];  Service: Endoscopy;  Laterality: N/A;  . HAND SURGERY     left, secondary to self-inflicted laceration  . HEMORRHOID SURGERY N/A 04/14/2017   Procedure: HEMORRHOIDECTOMY;  Surgeon: [name], MD;  Location: [facility];  Service: General;  Laterality: N/A;  . SHOULDER SURGERY     right  . TOE SURGERY     left great toe ,  amputated-lawnmover accident        Home Medications    Prior to Admission medications   Medication Sig Start Date End Date Taking? Authorizing Provider  albuterol (PROAIR HFA) 108 (90 Base) MCG/ACT inhaler Inhale 2 puffs into the lungs every 4 (four) hours as needed for wheezing or shortness of breath. 07/01/18   Leslye Peer, MD  albuterol (PROVENTIL) (2.5 MG/3ML) 0.083% nebulizer solution Take 3 mLs (2.5 mg total) by nebulization every 4 (four) hours as needed for shortness of breath. 02/02/17   Roslynn Amble, MD  aspirin EC 81 MG tablet Take 81 mg by mouth every evening.     [provider]  atorvastatin (LIPITOR) 20 MG tablet Take 1 tablet (20 mg total) by mouth daily. For high cholesterol 02/09/18   Money, Gerlene Burdock, FNP  budesonide-formoterol (SYMBICORT) 160-4.5 MCG/ACT inhaler Inhale 2 puffs into the lungs 2 (two) times daily.    [provider]  divalproex (DEPAKOTE ER) 500 MG 24 hr tablet Take 3 tablets (1,500 mg total) by mouth at bedtime. 11/28/18   Myrlene Broker, MD  FLUoxetine (PROZAC) 40 MG capsule Take 1 capsule (40 mg total) by mouth daily. For mood control 11/28/18   Myrlene Broker, MD  furosemide (LASIX) 20 MG tablet Take 1 tablet (20 mg total) by mouth daily. 06/18/18   Vassie Loll, MD  gabapentin (NEURONTIN) 400 MG capsule Take 1 capsule (400 mg total) by mouth 3 (three) times daily. 11/28/18   Myrlene Broker, MD  ipratropium (ATROVENT) 0.02 % nebulizer solution Take 2.5 mLs (0.5 mg total) by nebulization 3 (three) times daily. 01/25/18   Parrett, Virgel Bouquet, NP  metoprolol tartrate (LOPRESSOR) 50 MG tablet Take 1 tablet (50 mg total) by mouth 2 (two) times daily. 02/08/18   Money, Gerlene Burdock, FNP  OLANZapine (ZYPREXA) 10 MG tablet Take 1.5 tablets (15 mg total) by mouth at bedtime. For mood stability 11/28/18   Myrlene Broker, MD  pantoprazole (PROTONIX) 20 MG tablet Take 1 tablet (20 mg total) by mouth daily. 06/17/18 06/17/19  Vassie Loll, MD  potassium  chloride SA (K-DUR,KLOR-CON) 20 MEQ tablet Take 1 tablet (20 mEq total) by mouth daily. 06/18/18   Vassie Loll, MD  Tiotropium Bromide-Olodaterol (STIOLTO RESPIMAT) 2.5-2.5 MCG/ACT AERS Inhale 2 puffs into the lungs daily. 11/01/18   Leslye Peer, MD  traZODone (DESYREL) 50 MG tablet Take 1 tablet (50 mg total) by mouth at bedtime as needed for sleep. 11/28/18   Myrlene Broker, MD    Family History Family History  Problem Relation Age of Onset  . Breast cancer Mother        deceased  . Heart disease Father   . Depression Daughter   . Anxiety disorder Daughter   . Anxiety disorder Son   . Depression Son   . Asthma Brother   . Heart attack Maternal Aunt   . Heart attack Maternal Uncle   . Heart attack Paternal Aunt   . Heart attack Paternal Uncle   . Heart attack Maternal  Grandmother   . Heart attack Maternal Grandfather   . Emphysema Maternal Grandfather   . Heart attack Paternal Grandmother   . Heart attack Paternal Grandfather   . Colon cancer Neg Hx   . Liver disease Neg Hx     Social History Social History   Tobacco Use  . Smoking status: Current Every Day Smoker    Packs/day: 1.00    Years: 40.00    Pack years: 40.00    Types: Cigarettes    Start date: 08/04/1971  . Smokeless tobacco: Never Used  . Tobacco comment: peak rate of 2.5ppd, 1/2ppd on 11/27/2016 -- 6 cigarettes / day 01/25/18  Substance Use Topics  . Alcohol use: No    Alcohol/week: 0.0 standard drinks  . Drug use: Yes    Types: Marijuana    Comment: most days      Allergies   Augmentin [amoxicillin-pot clavulanate] and Ace inhibitors   Review of Systems Review of Systems  Unable to perform ROS: Mental status change     Physical Exam Updated Vital Signs BP 112/75   Pulse 61   Resp 15   Ht 5\' 9"  (1.753 m)   Wt 85 kg   SpO2 100%   BMI 27.67 kg/m   Physical Exam Vitals signs and nursing note reviewed.  Constitutional:      General: He is not in acute distress.    Appearance: Normal  appearance. He is well-developed.  HENT:     Head: Normocephalic and atraumatic.     Right Ear: Hearing normal.     Left Ear: Hearing normal.     Nose: Nose normal.  Eyes:     Conjunctiva/sclera: Conjunctivae normal.     Pupils: Pupils are equal, round, and reactive to light.  Neck:     Musculoskeletal: Normal range of motion and neck supple.  Cardiovascular:     Rate and Rhythm: Regular rhythm.     Heart sounds: S1 normal and S2 normal. No murmur. No friction rub. No gallop.   Pulmonary:     Effort: Pulmonary effort is normal. No respiratory distress.     Breath sounds: Normal breath sounds.  Chest:     Chest wall: No tenderness.  Abdominal:     General: Bowel sounds are normal.     Palpations: Abdomen is soft.     Tenderness: There is no abdominal tenderness. There is no guarding or rebound. Negative signs include Murphy's sign and McBurney's sign.     Hernia: No hernia is present.  Musculoskeletal: Normal range of motion.  Skin:    General: Skin is warm and dry.     Findings: No rash.  Neurological:     Mental Status: He is alert.     GCS: GCS eye subscore is 3. GCS verbal subscore is 4. GCS motor subscore is 6.     Cranial Nerves: No cranial nerve deficit.     Sensory: No sensory deficit.     Coordination: Coordination normal.     Comments: Somnolent, awakens to voice  Psychiatric:        Speech: Speech is slurred.      ED Treatments / Results  Labs (all labs ordered are listed, but only abnormal results are displayed) Labs Reviewed  CBC WITH DIFFERENTIAL/PLATELET - Abnormal; Notable for the following components:      Result Value   Abs Immature Granulocytes 0.37 (*)    All other components within normal limits  COMPREHENSIVE METABOLIC PANEL - Abnormal; Notable for  the following components:   Glucose, Bld 104 (*)    Calcium 8.8 (*)    All other components within normal limits  AMMONIA - Abnormal; Notable for the following components:   Ammonia 274 (*)    All  other components within normal limits  ACETAMINOPHEN LEVEL - Abnormal; Notable for the following components:   Acetaminophen (Tylenol), Serum <10 (*)    All other components within normal limits  RAPID URINE DRUG SCREEN, HOSP PERFORMED - Abnormal; Notable for the following components:   Cocaine POSITIVE (*)    Tetrahydrocannabinol POSITIVE (*)    All other components within normal limits  BETA-HYDROXYBUTYRIC ACID - Abnormal; Notable for the following components:   Beta-Hydroxybutyric Acid <0.05 (*)    All other components within normal limits  SALICYLATE LEVEL  ETHANOL  URINALYSIS, ROUTINE W REFLEX MICROSCOPIC  TROPONIN I  PROTIME-INR  BLOOD GAS, ARTERIAL    EKG None  Radiology Ct Head Wo Contrast  Result Date: 12/16/2018 CLINICAL DATA:  58 year old male with altered mental status. EXAM: CT HEAD WITHOUT CONTRAST TECHNIQUE: Contiguous axial images were obtained from the base of the skull through the vertex without intravenous contrast. COMPARISON:  Head CT dated 04/12/2017 an MRI dated 04/12/2017 FINDINGS: Brain: The ventricles and sulci appropriate size for patient's age. Minimal periventricular and deep white matter chronic microvascular ischemic changes noted. There is no acute intracranial hemorrhage. No mass effect or midline shift. No extra-axial fluid collection. Vascular: No hyperdense vessel or unexpected calcification. Skull: Normal. Negative for fracture or focal lesion. Sinuses/Orbits: There is opacification of the nasal passages. The main there of the visualized paranasal sinuses and mastoid air cells are clear. Other: An endotracheal and an enteric tube are partially visualized. IMPRESSION: No acute intracranial pathology. Electronically Signed   By: Elgie Collard M.D.   On: 12/16/2018 03:49   Dg Chest Port 1 View  Result Date: 12/16/2018 CLINICAL DATA:  Patient found by family unresponsive. Altered mental status. EXAM: PORTABLE CHEST 1 VIEW COMPARISON:  09/16/2018  FINDINGS: Heart size is top-normal. Nonaneurysmal slightly atherosclerotic aorta with central pulmonary vascular congestion. The patient's chin obscures the right lung apex. No acute pulmonary consolidation, effusion or pneumothorax. No acute osseous abnormality. IMPRESSION: Pulmonary vascular congestion.  Minimal aortic atherosclerosis. Electronically Signed   By: Tollie Eth M.D.   On: 12/16/2018 01:50   Dg Chest Port 1v Same Day  Result Date: 12/16/2018 CLINICAL DATA:  Post intubation imaging. EXAM: PORTABLE CHEST 1 VIEW COMPARISON:  None. FINDINGS: Endotracheal tube tip is noted 7.5 cm above the carina. A gastric tube with side port extends into the expected location of the stomach. The lungs are mildly hyperinflated with central vascular congestion noted. No acute osseous abnormality. Normal heart size and mediastinal contours. IMPRESSION: 1. Endotracheal tube tip is approximately 7.5 cm above the carina. 2. Gastric tube extends into the expected location of the stomach. 3. Hyperinflated lungs with central vascular congestion. Electronically Signed   By: Tollie Eth M.D.   On: 12/16/2018 03:36    Procedures Procedure Name: Intubation Date/Time: 12/16/2018 3:26 AM Performed by: Gilda Crease, MD Pre-anesthesia Checklist: Patient identified, Patient being monitored, Emergency Drugs available, Timeout performed and Suction available Oxygen Delivery Method: Non-rebreather mask Preoxygenation: Pre-oxygenation with 100% oxygen Induction Type: Rapid sequence Ventilation: Mask ventilation without difficulty Laryngoscope Size: Glidescope and 4 Grade View: Grade I Tube size: 7.5 mm Number of attempts: 1 Placement Confirmation: ETT inserted through vocal cords under direct vision,  CO2 detector and Breath sounds checked-  equal and bilateral Secured at: 23 cm Dental Injury: Teeth and Oropharynx as per pre-operative assessment     .Critical Care Performed by: Gilda Crease,  MD Authorized by: Gilda Crease, MD   Critical care provider statement:    Critical care time (minutes):  45   Critical care time was exclusive of:  Separately billable procedures and treating other patients   Critical care was necessary to treat or prevent imminent or life-threatening deterioration of the following conditions:  CNS failure or compromise   Critical care was time spent personally by me on the following activities:  Ordering and performing treatments and interventions, ordering and review of laboratory studies, development of treatment plan with patient or surrogate, discussions with consultants, ordering and review of radiographic studies, pulse oximetry, evaluation of patient's response to treatment, re-evaluation of patient's condition, review of old charts and examination of patient   I assumed direction of critical care for this patient from another provider in my specialty: no     (including critical care time)  Medications Ordered in ED Medications  propofol (DIPRIVAN) 1000 MG/100ML infusion (25 mcg/kg/min Intravenous Rate/Dose Change 12/16/18 0407)  sodium chloride 0.9 % bolus 1,000 mL (1,000 mLs Intravenous New Bag/Given 12/16/18 0400)  0.9 %  sodium chloride infusion (has no administration in time range)  haloperidol lactate (HALDOL) injection 5 mg (5 mg Intravenous Given 12/16/18 0118)  haloperidol lactate (HALDOL) injection 5 mg (5 mg Intravenous Given 12/16/18 0137)  haloperidol lactate (HALDOL) injection 5 mg (5 mg Intravenous Given 12/16/18 0148)  haloperidol lactate (HALDOL) injection 5 mg (5 mg Intravenous Given 12/16/18 0226)  haloperidol lactate (HALDOL) 5 MG/ML injection (  Given 12/16/18 0314)  etomidate (AMIDATE) injection 20 mg (20 mg Intravenous Given 12/16/18 0304)  succinylcholine (ANECTINE) injection 150 mg (150 mg Intravenous Given 12/16/18 0304)     Initial Impression / Assessment and Plan / ED Course  I have reviewed the triage vital signs and  the nursing notes.  Pertinent labs & imaging results that were available during my care of the patient were reviewed by me and considered in my medical decision making (see chart for details).     Patient arrived very somnolent and altered.  After a short period of time here in the ER, however, he became agitated.  He had to be physically restrained, as he was fighting staff and screaming.  He was not redirectable.  He was administered Haldol with minimal improvement.  Patient given multiple doses of Haldol which would keep him calm for approximately 5 minutes and then he would again start screaming and trying to climb out of bed.  At this point it was decided that he would require intubation to protect him as well as finish his work-up.  Family was at the bedside at this time and agreed with this course of action.  Intubation was performed without difficulty.  Blood work reveals significant hyperammonemia.  He does not have any abnormality of his LFTs or pro time.  Family has learned that he was with friends at some point in the last couple of days drinking "moonshine".  His records indicate that he has an extensive alcohol intake history.  Suspect that he has undiagnosed cirrhosis.  Will require hospitalization for further management.  Final Clinical Impressions(s) / ED Diagnoses   Final diagnoses:  Hepatic encephalopathy [facility])    ED Discharge Orders    None       Gilda Crease, MD 12/16/18 [id number]

## 2018-12-16 NOTE — Progress Notes (Signed)
ETT advanced 4cm per MD. Now 28cm measured from the lip

## 2018-12-16 NOTE — ED Notes (Signed)
Report given to Alinda Money with [facility] at this time. ETA 15-20 minutes

## 2018-12-16 NOTE — ED Triage Notes (Signed)
Pt arrives via [facility] after being found by family unresponsive.  Pt opens eyes to voice, but will not answer questions.

## 2018-12-16 NOTE — Progress Notes (Signed)
CRITICAL VALUE ALERT  Critical Value:  K+ 3.5  Date & Time Notied:  12/16/2018 1520  Provider Notified: Yes  Orders Received/Actions taken: None at this time

## 2018-12-16 NOTE — ED Notes (Signed)
CONTACT INFO:  Herbert SetaHeather (daughter): [phone] Son-in-law: [phone]

## 2018-12-16 NOTE — H&P (Signed)
History and Physical    KARRSON KAWCZYNSKI WRU:[id number] DOB: 1961-08-28 DOA: 12/16/2018  PCP: Gareth Morgan, MD   Patient coming from: Home   Chief Complaint: Poorly responsive   HPI: TERI UVA is a 58 y.o. male with medical history significant for alcohol and polysubstance abuse, COPD, bipolar disorder, and chronic diastolic CHF, now presenting to the emergency department after being found poorly responsive.  Patient is unable to contribute to the history secondary to his clinical condition.  He was last seen in his usual state 2 days ago.  He had reportedly been drinking alcohol with friends.  He was found today in bed, difficult to wake, and was brought into the ED for evaluation of this.  Patient would wake up and say his name in the ED, but was not providing any history.   ED Course: Upon arrival to the ED, patient is found to be saturating well on room air, and with vitals otherwise stable.  Chemistry panel, CBC, and urinalysis are unremarkable.  Troponin is undetectable.  Ethanol level, salicylates, and acetaminophen levels are undetectable.  UDS is positive for cocaine and THC.  Noncontrast head CT is negative for acute intracranial abnormality.  Chest x-ray is notable for vascular congestion.  The patient became very agitated, continued to represent a danger to himself and staff despite multiple doses of Haldol, was sedated and paralyzed, intubated, and started on propofol infusion.  Blood pressure dropped after the propofol infusion was started and he was given an IV fluid bolus.  He will be admitted to the ICU for further evaluation and management of acute encephalopathy.    Review of Systems:  All other systems reviewed and apart from HPI, are negative.  Past Medical History:  Diagnosis Date  . Acute blood loss anemia 02/07/2017  . Anxiety   . Arthritis    deg disease, bulging disk,  shoulder level  . Bipolar disorder (HCC)   . Bipolar disorder (HCC)   . COPD, severe (HCC)  10/09/2016  . Depression    anxiety  . Hyperlipidemia   . Hypertension   . Peptic ulcer disease    Review  . Pneumonia   . [name] (peripheral vascular disease) (HCC) 06/18/2015  . Shortness of breath     Past Surgical History:  Procedure Laterality Date  . BIOPSY  02/09/2017   Procedure: BIOPSY;  Surgeon: [name], MD;  Location: [facility];  Service: Endoscopy;;  duodenal gastric  . COLONOSCOPY  03/14/2007   BJY:NWGNFA colonoscopy and terminal ileoscopy except external hemorrhoids  . COLONOSCOPY N/A 05/05/2013   [name]: external/internal anal canal hemorrhoids, unable to intubate TI, segemental biopsies unremarkable   . COLONOSCOPY N/A 04/11/2017   Procedure: COLONOSCOPY;  Surgeon: [name], MD;  Location: [facility];  Service: Endoscopy;  Laterality: N/A;  . COLONOSCOPY WITH PROPOFOL N/A 03/31/2017   Procedure: COLONOSCOPY WITH PROPOFOL;  Surgeon: [name], MD;  Location: [facility];  Service: Endoscopy;  Laterality: N/A;  1:45pm  . ESOPHAGOGASTRODUODENOSCOPY  03/14/2007   OZH:[id number] antral gastritis with bulbar duodenitis/paucity to postbulbar duodenal folds and biopsy were benign with no evidence of villous atrophy.  . ESOPHAGOGASTRODUODENOSCOPY (EGD) WITH PROPOFOL N/A 02/09/2017   Procedure: ESOPHAGOGASTRODUODENOSCOPY (EGD) WITH PROPOFOL;  Surgeon: [name], MD;  Location: [facility];  Service: Endoscopy;  Laterality: N/A;  . GIVENS CAPSULE STUDY N/A 04/08/2017   Procedure: GIVENS CAPSULE STUDY;  Surgeon: [name], MD;  Location: [facility];  Service: Endoscopy;  Laterality: N/A;  . HAND SURGERY     left, secondary to self-inflicted laceration  . HEMORRHOID SURGERY N/A 04/14/2017   Procedure: HEMORRHOIDECTOMY;  Surgeon: [name], MD;  Location: [facility];  Service: General;  Laterality: N/A;  . SHOULDER SURGERY     right  . TOE SURGERY     left great toe , amputated-lawnmover accident     reports that he has been smoking  cigarettes. He started smoking about 47 years ago. He has a 40.00 pack-year smoking history. He has never used smokeless tobacco. He reports current drug use. Drug: Marijuana. He reports that he does not drink alcohol.  Allergies  Allergen Reactions  . Augmentin [Amoxicillin-Pot Clavulanate] Rash and Other (See Comments)    Has patient had a PCN reaction causing immediate rash, facial/tongue/throat swelling, SOB or lightheadedness with hypotension: No Has patient had a PCN reaction causing severe rash involving mucus membranes or skin necrosis: No Has patient had a PCN reaction that required hospitalization No Has patient had a PCN reaction occurring within the last 10 years: Yes If all of the above answers are "NO", then may proceed with Cephalosporin use.  Donivan Scull Inhibitors Hives    Family History  Problem Relation Age of Onset  . Breast cancer Mother        deceased  . Heart disease Father   . Depression Daughter   . Anxiety disorder Daughter   . Anxiety disorder Son   . Depression Son   . Asthma Brother   . Heart attack Maternal Aunt   . Heart attack Maternal Uncle   . Heart attack Paternal Aunt   . Heart attack Paternal Uncle   . Heart attack Maternal Grandmother   . Heart attack Maternal Grandfather   . Emphysema Maternal Grandfather   . Heart attack Paternal Grandmother   . Heart attack Paternal Grandfather   . Colon cancer Neg Hx   . Liver disease Neg Hx      Prior to Admission medications   Medication Sig Start Date End Date Taking? Authorizing Provider  albuterol (PROAIR HFA) 108 (90 Base) MCG/ACT inhaler Inhale 2 puffs into the lungs every 4 (four) hours as needed for wheezing or shortness of breath. 07/01/18   Leslye Peer, MD  albuterol (PROVENTIL) (2.5 MG/3ML) 0.083% nebulizer solution Take 3 mLs (2.5 mg total) by nebulization every 4 (four) hours as needed for shortness of breath. 02/02/17   Roslynn Amble, MD  aspirin EC 81 MG tablet Take 81 mg by mouth  every evening.     [provider]  atorvastatin (LIPITOR) 20 MG tablet Take 1 tablet (20 mg total) by mouth daily. For high cholesterol 02/09/18   Money, Gerlene Burdock, FNP  budesonide-formoterol (SYMBICORT) 160-4.5 MCG/ACT inhaler Inhale 2 puffs into the lungs 2 (two) times daily.    [provider]  divalproex (DEPAKOTE ER) 500 MG 24 hr tablet Take 3 tablets (1,500 mg total) by mouth at bedtime. 11/28/18   Myrlene Broker, MD  FLUoxetine (PROZAC) 40 MG capsule Take 1 capsule (40 mg total) by mouth daily. For mood control 11/28/18   Myrlene Broker, MD  furosemide (LASIX) 20 MG tablet Take 1 tablet (20 mg total) by mouth daily. 06/18/18   Vassie Loll, MD  gabapentin (NEURONTIN) 400 MG capsule Take 1 capsule (400 mg total) by mouth 3 (three) times daily. 11/28/18   Myrlene Broker, MD  ipratropium (ATROVENT) 0.02 % nebulizer solution Take 2.5 mLs (  0.5 mg total) by nebulization 3 (three) times daily. 01/25/18   Parrett, Virgel Bouquet, NP  metoprolol tartrate (LOPRESSOR) 50 MG tablet Take 1 tablet (50 mg total) by mouth 2 (two) times daily. 02/08/18   Money, Gerlene Burdock, FNP  OLANZapine (ZYPREXA) 10 MG tablet Take 1.5 tablets (15 mg total) by mouth at bedtime. For mood stability 11/28/18   Myrlene Broker, MD  pantoprazole (PROTONIX) 20 MG tablet Take 1 tablet (20 mg total) by mouth daily. 06/17/18 06/17/19  Vassie Loll, MD  potassium chloride SA (K-DUR,KLOR-CON) 20 MEQ tablet Take 1 tablet (20 mEq total) by mouth daily. 06/18/18   Vassie Loll, MD  Tiotropium Bromide-Olodaterol (STIOLTO RESPIMAT) 2.5-2.5 MCG/ACT AERS Inhale 2 puffs into the lungs daily. 11/01/18   Leslye Peer, MD  traZODone (DESYREL) 50 MG tablet Take 1 tablet (50 mg total) by mouth at bedtime as needed for sleep. 11/28/18   Myrlene Broker, MD    Physical Exam: Vitals:   12/16/18 0416 12/16/18 0420 12/16/18 0424 12/16/18 0427  BP: 109/69 102/63 (!) 88/51 (!) 81/49  Pulse: 64 (!) 59 62 60  Resp: 11 15 18 18   SpO2: 100% 100% 100%  100%  Weight:      Height:        Constitutional: intubated, mechanically ventilated, no diaphoresis, no pallor Eyes: PERTLA, lids and conjunctivae normal ENMT: Mucous membranes are moist. Posterior pharynx clear of any exudate or lesions.   Neck: normal, supple, no masses, no thyromegaly Respiratory: clear to auscultation bilaterally, no wheezing, no crackles. Normal respiratory effort.    Cardiovascular: S1 & S2 heard, regular rate and rhythm. No extremity edema.   Abdomen: No distension, no tenderness, soft. Bowel sounds normal.  Musculoskeletal: no clubbing / cyanosis. Distal 1st toe on left is absent.   Skin: no significant rashes, lesions, ulcers. Warm, dry, well-perfused. Neurologic: No facial asymmetry. Patellar DTRs intact. Babinski response downgoing.     Labs on Admission: I have personally reviewed following labs and imaging studies  CBC: Recent Labs  Lab 12/16/18 0107  WBC 10.2  NEUTROABS 7.3  HGB 15.4  HCT 46.8  MCV 91.4  PLT 222   Basic Metabolic Panel: Recent Labs  Lab 12/16/18 0107  NA 138  K 4.3  CL 99  CO2 31  GLUCOSE 104*  BUN 12  CREATININE 0.89  CALCIUM 8.8*   GFR: Estimated Creatinine Clearance: 97.8 mL/min (by C-G formula based on SCr of 0.89 mg/dL). Liver Function Tests: Recent Labs  Lab 12/16/18 0107  AST 18  ALT 15  ALKPHOS 64  BILITOT 0.6  PROT 6.5  ALBUMIN 3.5   No results for input(s): LIPASE, AMYLASE in the last 168 hours. Recent Labs  Lab 12/16/18 0107  AMMONIA 274*   Coagulation Profile: Recent Labs  Lab 12/16/18 0107  INR 0.91   Cardiac Enzymes: Recent Labs  Lab 12/16/18 0107  TROPONINI <0.03   BNP (last 3 results) No results for input(s): PROBNP in the last 8760 hours. HbA1C: No results for input(s): HGBA1C in the last 72 hours. CBG: No results for input(s): GLUCAP in the last 168 hours. Lipid Profile: No results for input(s): CHOL, HDL, LDLCALC, TRIG, CHOLHDL, LDLDIRECT in the last 72 hours. Thyroid  Function Tests: No results for input(s): TSH, T4TOTAL, FREET4, T3FREE, THYROIDAB in the last 72 hours. Anemia Panel: No results for input(s): VITAMINB12, FOLATE, FERRITIN, TIBC, IRON, RETICCTPCT in the last 72 hours. Urine analysis:    Component Value Date/Time   COLORURINE YELLOW 12/16/2018  0315   APPEARANCEUR CLEAR 12/16/2018 0315   LABSPEC 1.014 12/16/2018 0315   PHURINE 8.0 12/16/2018 0315   GLUCOSEU NEGATIVE 12/16/2018 0315   HGBUR NEGATIVE 12/16/2018 0315   BILIRUBINUR NEGATIVE 12/16/2018 0315   KETONESUR NEGATIVE 12/16/2018 0315   PROTEINUR NEGATIVE 12/16/2018 0315   UROBILINOGEN 0.2 03/01/2011 1427   NITRITE NEGATIVE 12/16/2018 0315   LEUKOCYTESUR NEGATIVE 12/16/2018 0315   Sepsis Labs: @LABRCNTIP (procalcitonin:4,lacticidven:4) )No results found for this or any previous visit (from the past 240 hour(s)).   Radiological Exams on Admission: Ct Head Wo Contrast  Result Date: 12/16/2018 CLINICAL DATA:  58 year old male with altered mental status. EXAM: CT HEAD WITHOUT CONTRAST TECHNIQUE: Contiguous axial images were obtained from the base of the skull through the vertex without intravenous contrast. COMPARISON:  Head CT dated 04/12/2017 an MRI dated 04/12/2017 FINDINGS: Brain: The ventricles and sulci appropriate size for patient's age. Minimal periventricular and deep white matter chronic microvascular ischemic changes noted. There is no acute intracranial hemorrhage. No mass effect or midline shift. No extra-axial fluid collection. Vascular: No hyperdense vessel or unexpected calcification. Skull: Normal. Negative for fracture or focal lesion. Sinuses/Orbits: There is opacification of the nasal passages. The main there of the visualized paranasal sinuses and mastoid air cells are clear. Other: An endotracheal and an enteric tube are partially visualized. IMPRESSION: No acute intracranial pathology. Electronically Signed   By: Elgie Collard M.D.   On: 12/16/2018 03:49   Dg Chest  Port 1 View  Result Date: 12/16/2018 CLINICAL DATA:  Patient found by family unresponsive. Altered mental status. EXAM: PORTABLE CHEST 1 VIEW COMPARISON:  09/16/2018 FINDINGS: Heart size is top-normal. Nonaneurysmal slightly atherosclerotic aorta with central pulmonary vascular congestion. The patient's chin obscures the right lung apex. No acute pulmonary consolidation, effusion or pneumothorax. No acute osseous abnormality. IMPRESSION: Pulmonary vascular congestion.  Minimal aortic atherosclerosis. Electronically Signed   By: Tollie Eth M.D.   On: 12/16/2018 01:50   Dg Chest Port 1v Same Day  Result Date: 12/16/2018 CLINICAL DATA:  Post intubation imaging. EXAM: PORTABLE CHEST 1 VIEW COMPARISON:  None. FINDINGS: Endotracheal tube tip is noted 7.5 cm above the carina. A gastric tube with side port extends into the expected location of the stomach. The lungs are mildly hyperinflated with central vascular congestion noted. No acute osseous abnormality. Normal heart size and mediastinal contours. IMPRESSION: 1. Endotracheal tube tip is approximately 7.5 cm above the carina. 2. Gastric tube extends into the expected location of the stomach. 3. Hyperinflated lungs with central vascular congestion. Electronically Signed   By: Tollie Eth M.D.   On: 12/16/2018 03:36    EKG: Not performed.   Assessment/Plan   1. Acute encephalopathy  - Presents with somnolence, became agitated in ED, remained combative and a danger to self and staff despite multiple doses of Haldol, and was intubated   - Head CT is negative for acute intracranial abnormality, basic labs and UA are unremarkable  - Ammonia is elevated to 274  - Likely hepatic encephalopathy, undiagnosed chronic liver disease suspected  - Depakote poisoning could cause elevated ammonia but there are no other electrolyte abnormalities as would be expected  - Check TSH, valproate level, RPR, B12, and folate  - Start lactulose, continue supportive care, vent  mgmt    2. COPD  - No wheezing on admission  - Continue ICS/LABA, LAMA, and as-needed albuterol    3. Chronic diastolic CHF  - Appears compensated  - Follow I/O's and daily weights   4.  Bipolar disorder  - Unable to assess d/t clinical condition  - Managed at home with Depakote, Zyprexa, Prozac, and trazodone    DVT prophylaxis: Lovenox  Code Status: Full  Family Communication: Family updated at bedside Consults called: None Admission status: observation     Briscoe Deutscher, MD [facility] Pager [phone]  If 7PM-7AM, please contact night-coverage [email or web address] Password [email or web address]  12/16/2018, 4:31 AM

## 2018-12-17 ENCOUNTER — Inpatient Hospital Stay (HOSPITAL_COMMUNITY): Payer: Medicare Other

## 2018-12-17 LAB — BLOOD GAS, ARTERIAL
Acid-Base Excess: 0.5 mmol/L (ref 0.0–2.0)
Bicarbonate: 24.5 mmol/L (ref 20.0–28.0)
Drawn by: 414221
FIO2: 40
MECHVT: 560 mL
O2 Saturation: 95.4 %
PEEP: 5 cmH2O
Patient temperature: 99.5
RATE: 16 resp/min
pCO2 arterial: 39.8 mmHg (ref 32.0–48.0)
pH, Arterial: 7.41 (ref 7.350–7.450)
pO2, Arterial: 81.3 mmHg — ABNORMAL LOW (ref 83.0–108.0)

## 2018-12-17 LAB — CBC WITH DIFFERENTIAL/PLATELET
Abs Immature Granulocytes: 0.07 10*3/uL (ref 0.00–0.07)
Basophils Absolute: 0 10*3/uL (ref 0.0–0.1)
Basophils Relative: 0 %
Eosinophils Absolute: 0 10*3/uL (ref 0.0–0.5)
Eosinophils Relative: 0 %
HCT: 38.1 % — ABNORMAL LOW (ref 39.0–52.0)
Hemoglobin: 12.1 g/dL — ABNORMAL LOW (ref 13.0–17.0)
Immature Granulocytes: 1 %
Lymphocytes Relative: 9 %
Lymphs Abs: 0.7 10*3/uL (ref 0.7–4.0)
MCH: 29 pg (ref 26.0–34.0)
MCHC: 31.8 g/dL (ref 30.0–36.0)
MCV: 91.4 fL (ref 80.0–100.0)
Monocytes Absolute: 0.8 10*3/uL (ref 0.1–1.0)
Monocytes Relative: 10 %
Neutro Abs: 6.2 10*3/uL (ref 1.7–7.7)
Neutrophils Relative %: 80 %
Platelets: 132 10*3/uL — ABNORMAL LOW (ref 150–400)
RBC: 4.17 MIL/uL — ABNORMAL LOW (ref 4.22–5.81)
RDW: 12.9 % (ref 11.5–15.5)
WBC: 7.8 10*3/uL (ref 4.0–10.5)
nRBC: 0 % (ref 0.0–0.2)

## 2018-12-17 LAB — COMPREHENSIVE METABOLIC PANEL
ALT: 11 U/L (ref 0–44)
AST: 21 U/L (ref 15–41)
Albumin: 2.5 g/dL — ABNORMAL LOW (ref 3.5–5.0)
Alkaline Phosphatase: 50 U/L (ref 38–126)
Anion gap: 8 (ref 5–15)
BUN: 10 mg/dL (ref 6–20)
CO2: 23 mmol/L (ref 22–32)
Calcium: 7.9 mg/dL — ABNORMAL LOW (ref 8.9–10.3)
Chloride: 110 mmol/L (ref 98–111)
Creatinine, Ser: 0.77 mg/dL (ref 0.61–1.24)
GFR calc Af Amer: >60 mL/min (ref >60)
GFR calc non Af Amer: >60 mL/min (ref >60)
Glucose, Bld: 115 mg/dL — ABNORMAL HIGH (ref 70–99)
Potassium: 3.7 mmol/L (ref 3.5–5.1)
Sodium: 141 mmol/L (ref 135–145)
Total Bilirubin: 0.9 mg/dL (ref 0.3–1.2)
Total Protein: 4.7 g/dL — ABNORMAL LOW (ref 6.5–8.1)

## 2018-12-17 LAB — URINE CULTURE
Culture: NO GROWTH
Special Requests: NORMAL

## 2018-12-17 LAB — HIV ANTIBODY (ROUTINE TESTING W REFLEX): HIV Screen 4th Generation wRfx: NONREACTIVE

## 2018-12-17 LAB — POCT I-STAT 7, (LYTES, BLD GAS, ICA,H+H)
Acid-base deficit: 2 mmol/L (ref 0.0–2.0)
Bicarbonate: 22.6 mmol/L (ref 20.0–28.0)
Calcium, Ion: 1.14 mmol/L — ABNORMAL LOW (ref 1.15–1.40)
HCT: 38 % — ABNORMAL LOW (ref 39.0–52.0)
Hemoglobin: 12.9 g/dL — ABNORMAL LOW (ref 13.0–17.0)
O2 Saturation: 99 %
Patient temperature: 100.7
Potassium: 3.2 mmol/L — ABNORMAL LOW (ref 3.5–5.1)
Sodium: 139 mmol/L (ref 135–145)
TCO2: 24 mmol/L (ref 22–32)
pCO2 arterial: 40.1 mmHg (ref 32.0–48.0)
pH, Arterial: 7.365 (ref 7.350–7.450)
pO2, Arterial: 169 mmHg — ABNORMAL HIGH (ref 83.0–108.0)

## 2018-12-17 LAB — GLUCOSE, CAPILLARY
Glucose-Capillary: 117 mg/dL — ABNORMAL HIGH (ref 70–99)
Glucose-Capillary: 112 mg/dL — ABNORMAL HIGH (ref 70–99)
Glucose-Capillary: 131 mg/dL — ABNORMAL HIGH (ref 70–99)
Glucose-Capillary: 155 mg/dL — ABNORMAL HIGH (ref 70–99)

## 2018-12-17 LAB — RPR: RPR Ser Ql: NONREACTIVE

## 2018-12-17 LAB — MAGNESIUM: Magnesium: 2.2 mg/dL (ref 1.7–2.4)

## 2018-12-17 LAB — PHOSPHORUS: Phosphorus: 3.6 mg/dL (ref 2.5–4.6)

## 2018-12-17 MED ORDER — LORAZEPAM 2 MG/ML IJ SOLN
1.0000 mg | INTRAMUSCULAR | Status: DC | PRN
  Administered 2018-12-19: 2 mg via INTRAVENOUS
  Administered 2018-12-20: 4 mg via INTRAVENOUS
  Administered 2018-12-29 – 2019-01-01 (×6): 2 mg via INTRAVENOUS
  Administered 2019-01-02: 1 mg via INTRAVENOUS
  Administered 2019-01-02: 2 mg via INTRAVENOUS
  Filled 2018-12-17: qty 2
  Filled 2018-12-17 (×11): qty 1

## 2018-12-17 MED ORDER — DEXMEDETOMIDINE HCL IN NACL 400 MCG/100ML IV SOLN
0.4000 ug/kg/h | INTRAVENOUS | Status: DC
  Administered 2018-12-17: 0.6 ug/kg/h via INTRAVENOUS
  Administered 2018-12-17: 0.4 ug/kg/h via INTRAVENOUS
  Administered 2018-12-18: 0.1 ug/kg/h via INTRAVENOUS
  Administered 2018-12-18: 0.6 ug/kg/h via INTRAVENOUS
  Administered 2018-12-19: 0.5 ug/kg/h via INTRAVENOUS
  Administered 2018-12-19: 0.2 ug/kg/h via INTRAVENOUS
  Administered 2018-12-19 – 2018-12-20 (×2): 1 ug/kg/h via INTRAVENOUS
  Administered 2018-12-20: 2 ug/kg/h via INTRAVENOUS
  Administered 2018-12-20: 1 ug/kg/h via INTRAVENOUS
  Administered 2018-12-20: 0.8 ug/kg/h via INTRAVENOUS
  Administered 2018-12-20: 1.4 ug/kg/h via INTRAVENOUS
  Administered 2018-12-21: 1.3 ug/kg/h via INTRAVENOUS
  Administered 2018-12-21: 1 ug/kg/h via INTRAVENOUS
  Administered 2018-12-21: 0.6 ug/kg/h via INTRAVENOUS
  Administered 2018-12-21: 1 ug/kg/h via INTRAVENOUS
  Administered 2018-12-22: 1.2 ug/kg/h via INTRAVENOUS
  Administered 2018-12-22: 1 ug/kg/h via INTRAVENOUS
  Administered 2018-12-22: 1.2 ug/kg/h via INTRAVENOUS
  Administered 2018-12-22: 0.9 ug/kg/h via INTRAVENOUS
  Administered 2018-12-23: 1 ug/kg/h via INTRAVENOUS
  Administered 2018-12-23 (×3): 1.2 ug/kg/h via INTRAVENOUS
  Administered 2018-12-23: 1.4 ug/kg/h via INTRAVENOUS
  Administered 2018-12-24 (×3): 1.5 ug/kg/h via INTRAVENOUS
  Administered 2018-12-24 (×2): 1.4 ug/kg/h via INTRAVENOUS
  Administered 2018-12-24: 1.5 ug/kg/h via INTRAVENOUS
  Administered 2018-12-25 (×7): 1.6 ug/kg/h via INTRAVENOUS
  Administered 2018-12-26: 1.602 ug/kg/h via INTRAVENOUS
  Administered 2018-12-26: 1.9 ug/kg/h via INTRAVENOUS
  Administered 2018-12-26: 2 ug/kg/h via INTRAVENOUS
  Administered 2018-12-26: 1.8 ug/kg/h via INTRAVENOUS
  Administered 2018-12-26: 1.2 ug/kg/h via INTRAVENOUS
  Administered 2018-12-26: 2 ug/kg/h via INTRAVENOUS
  Administered 2018-12-26: 1.6 ug/kg/h via INTRAVENOUS
  Administered 2018-12-27 (×2): 1 ug/kg/h via INTRAVENOUS
  Administered 2018-12-27: 1.5 ug/kg/h via INTRAVENOUS
  Administered 2018-12-27: 1.3 ug/kg/h via INTRAVENOUS
  Administered 2018-12-28: 1.1 ug/kg/h via INTRAVENOUS
  Administered 2018-12-28: 1 ug/kg/h via INTRAVENOUS
  Filled 2018-12-17 (×15): qty 100
  Filled 2018-12-17: qty 200
  Filled 2018-12-17 (×6): qty 100
  Filled 2018-12-17: qty 200
  Filled 2018-12-17 (×27): qty 100

## 2018-12-17 MED ORDER — LACTULOSE 10 GM/15ML PO SOLN
30.0000 g | Freq: Two times a day (BID) | ORAL | Status: DC
  Administered 2018-12-18 – 2018-12-20 (×5): 30 g via ORAL
  Filled 2018-12-17 (×6): qty 45

## 2018-12-17 MED ORDER — METHYLPREDNISOLONE SODIUM SUCC 125 MG IJ SOLR
40.0000 mg | Freq: Four times a day (QID) | INTRAMUSCULAR | Status: DC
  Administered 2018-12-17 – 2018-12-20 (×13): 40 mg via INTRAVENOUS
  Filled 2018-12-17 (×13): qty 2

## 2018-12-17 MED ORDER — FUROSEMIDE 10 MG/ML IJ SOLN
40.0000 mg | Freq: Once | INTRAMUSCULAR | Status: AC
  Administered 2018-12-17: 40 mg via INTRAVENOUS
  Filled 2018-12-17: qty 4

## 2018-12-17 MED ORDER — POTASSIUM CHLORIDE 10 MEQ/100ML IV SOLN
10.0000 meq | INTRAVENOUS | Status: AC
  Administered 2018-12-17 – 2018-12-18 (×4): 10 meq via INTRAVENOUS
  Filled 2018-12-17 (×6): qty 100

## 2018-12-17 MED ORDER — LORAZEPAM 2 MG/ML IJ SOLN
1.0000 mg | INTRAMUSCULAR | Status: DC | PRN
  Administered 2018-12-17 – 2018-12-19 (×6): 2 mg via INTRAVENOUS
  Administered 2018-12-20: 1 mg via INTRAVENOUS
  Administered 2018-12-21 – 2018-12-28 (×11): 2 mg via INTRAVENOUS
  Filled 2018-12-17 (×16): qty 1

## 2018-12-17 NOTE — Progress Notes (Signed)
eLink Physician-Brief Progress Note Patient Name: Jeffrey [name] DOB: 23-Jul-1961 MRN: [id number]   Date of Service  12/17/2018  HPI/Events of Note  Hypokalemia  eICU Interventions  Potassium replaced     Intervention Category Intermediate Interventions: Electrolyte abnormality - evaluation and management  [name] 12/17/2018, 8:33 PM

## 2018-12-17 NOTE — Progress Notes (Signed)
NAME:  Jeffrey [name], MRN:  [id number], DOB:  Dec 15, 1960, LOS: 1 ADMISSION DATE:  12/16/2018,  REFERRING MD: [facility] emergency department physician, CHIEF COMPLAINT: Altered mental status, elevated ammonia level  Brief History   58 yo male smoker with altered mental status, agitation.  Intubated for airway protection.  Elevated ammonia level (274).  UDS positive for cocaine, THC.  Hx of ETOH, Bipolar, COPD.  Past Medical History  COPD with chronic tobacco abuse Bipolar Polysubstance abuse  Significant Hospital Events   12/16/2018 intubated  Consults:    Procedures:  12/16/2018 intubation>>  Significant Diagnostic Tests:  12/16/2018 CT head >> negative  Micro Data:    Antimicrobials:    Interim history/subjective:  Remains on vent, sedation.  Objective   Blood pressure 113/66, pulse 85, temperature (!) 96.4 F (35.8 C), temperature source Axillary, resp. rate 18, height 5\' 9"  (1.753 m), weight 71.4 kg, SpO2 98 %.    Vent Mode: PRVC FiO2 (%):  [30 %-100 %] 30 % Set Rate:  [16 bmp-20 bmp] 16 bmp Vt Set:  [560 mL] 560 mL PEEP:  [5 cmH20] 5 cmH20 Plateau Pressure:  [13 cmH20-19 cmH20] 19 cmH20   Intake/Output Summary (Last 24 hours) at 12/17/2018 0849 Last data filed at 12/17/2018 0700 Gross per 24 hour  Intake 3423.09 ml  Output 616.5 ml  Net 2806.59 ml   Filed Weights   12/16/18 0315 12/16/18 1217 12/17/18 0200  Weight: 85 kg 69.7 kg 71.4 kg    Examination:  General - sedated Eyes - pupils reactive ENT - ETT in place Cardiac - regular rate/rhythm, no murmur Chest - equal breath sounds b/l, no wheezing or rales Abdomen - soft, non tender, + bowel sounds Extremities - no cyanosis, clubbing, or edema Skin - no rashes Neuro - RASS -2  CXR - no ASD     Resolved Hospital Problem list     Assessment & Plan:   Acute hypoxic respiratory failure in setting of cocaine/THC overdose. Hx of COPD with tobacco abuse. Plan - might be ready for  trial of extubation if mental status improved - scheduled BDs  Acute metabolic encephalopathy. UDS positive for cocaine, THC. Hx of ETOH. Hyperammonemia. Plan - limit sedation - monitor for withdrawal - thiamine, folic acid - f/u ammonia level; continue lactulose  Hx of bipolar disease. Plan - hold outpt depakote, zyprexa, prozac, trazodone for now   Best practice:  Diet: N.p.o. DVT prophylaxis: Lovenox GI prophylaxis: Protonix Mobility: Bedrest Code Status: Full Family Communication: updated family at bedside  Labs   CBC: Recent Labs  Lab 12/16/18 0107 12/16/18 1307 12/16/18 1314 12/17/18 0325  WBC 10.2  --  7.4 7.8  NEUTROABS 7.3  --   --  6.2  HGB 15.4 11.6* 11.7* 12.1*  HCT 46.8 34.0* 37.3* 38.1*  MCV 91.4  --  90.8 91.4  PLT 222  --  184 132*    Basic Metabolic Panel: Recent Labs  Lab 12/16/18 0107 12/16/18 1307 12/16/18 1314 12/17/18 0325  NA 138 138 139 141  K 4.3 3.7 3.5 3.7  CL 99  --  107 110  CO2 31  --  25 23  GLUCOSE 104*  --  83 115*  BUN 12  --  9 10  CREATININE 0.89  --  0.75 0.77  CALCIUM 8.8*  --  7.9* 7.9*  MG  --   --  2.1 2.2  PHOS  --   --  3.4 3.6   GFR: Estimated  Creatinine Clearance: 100.6 mL/min (by C-G formula based on SCr of 0.77 mg/dL). Recent Labs  Lab 12/16/18 0107 12/16/18 1314 12/17/18 0325  PROCALCITON  --  <0.10  --   WBC 10.2 7.4 7.8  LATICACIDVEN  --  1.4  --     Liver Function Tests: Recent Labs  Lab 12/16/18 0107 12/16/18 1314 12/17/18 0325  AST 18 20 21   ALT 15 12 11   ALKPHOS 64 49 50  BILITOT 0.6 0.6 0.9  PROT 6.5 4.6* 4.7*  ALBUMIN 3.5 2.7* 2.5*   Recent Labs  Lab 12/16/18 1314  LIPASE 25  AMYLASE 35   Recent Labs  Lab 12/16/18 0107  AMMONIA 274*    ABG    Component Value Date/Time   PHART 7.410 12/17/2018 0500   PCO2ART 39.8 12/17/2018 0500   PO2ART 81.3 (L) 12/17/2018 0500   HCO3 24.5 12/17/2018 0500   TCO2 29 12/16/2018 1307   O2SAT 95.4 12/17/2018 0500      CBG: Recent Labs  Lab 12/16/18 1530 12/16/18 2003 12/16/18 2324 12/17/18 0407 12/17/18 0738  GLUCAP 91 101* 117* 112* 131*   CC time 33 minutes  Coralyn Helling, MD [facility] Pulmonary/Critical Care 12/17/2018, 8:59 AM

## 2018-12-17 NOTE — Procedures (Signed)
Extubation Procedure Note  Patient Details:   Name: Jeffrey [name] DOB: 03-21-1961 MRN: [id number]   Airway Documentation:    Vent end date: 12/17/18 Vent end time: 1025   Evaluation  O2 sats: transiently fell during during procedure and currently acceptable Complications: Complications of Pt placed on bipap Patient did not tolerate procedure well. Bilateral Breath Sounds: Clear   No   Pt extubated Per MD to 4L, increased to NRB. Pt with labored breathing and not following commands. MD to bedside with verbal order for bipap stat. Pt placed on NIV PC 8/8 R 10 60%. PRN Albuterol given per MD request. RT will closely monitor pt for possible re-intubation needs  Carolan Shiver 12/17/2018, 10:33 AM

## 2018-12-18 ENCOUNTER — Inpatient Hospital Stay (HOSPITAL_COMMUNITY): Payer: Medicare Other

## 2018-12-18 DIAGNOSIS — J9601 Acute respiratory failure with hypoxia: Secondary | ICD-10-CM

## 2018-12-18 LAB — COMPREHENSIVE METABOLIC PANEL
ALT: 14 U/L (ref 0–44)
AST: 28 U/L (ref 15–41)
Albumin: 2.3 g/dL — ABNORMAL LOW (ref 3.5–5.0)
Alkaline Phosphatase: 55 U/L (ref 38–126)
Anion gap: 9 (ref 5–15)
BUN: 19 mg/dL (ref 6–20)
CO2: 24 mmol/L (ref 22–32)
Calcium: 8.2 mg/dL — ABNORMAL LOW (ref 8.9–10.3)
Chloride: 106 mmol/L (ref 98–111)
Creatinine, Ser: 0.87 mg/dL (ref 0.61–1.24)
GFR calc Af Amer: >60 mL/min (ref >60)
GFR calc non Af Amer: >60 mL/min (ref >60)
Glucose, Bld: 176 mg/dL — ABNORMAL HIGH (ref 70–99)
Potassium: 4 mmol/L (ref 3.5–5.1)
Sodium: 139 mmol/L (ref 135–145)
Total Bilirubin: 0.7 mg/dL (ref 0.3–1.2)
Total Protein: 5 g/dL — ABNORMAL LOW (ref 6.5–8.1)

## 2018-12-18 LAB — POCT I-STAT EG7
Acid-base deficit: 2 mmol/L (ref 0.0–2.0)
Bicarbonate: 24.3 mmol/L (ref 20.0–28.0)
Calcium, Ion: 1.24 mmol/L (ref 1.15–1.40)
HCT: 38 % — ABNORMAL LOW (ref 39.0–52.0)
Hemoglobin: 12.9 g/dL — ABNORMAL LOW (ref 13.0–17.0)
O2 Saturation: 87 %
Patient temperature: 98.6
Potassium: 4.3 mmol/L (ref 3.5–5.1)
Sodium: 137 mmol/L (ref 135–145)
TCO2: 26 mmol/L (ref 22–32)
pCO2, Ven: 44.7 mmHg (ref 44.0–60.0)
pH, Ven: 7.343 (ref 7.250–7.430)
pO2, Ven: 57 mmHg — ABNORMAL HIGH (ref 32.0–45.0)

## 2018-12-18 LAB — BLOOD GAS, ARTERIAL
Acid-Base Excess: 1 mmol/L (ref 0.0–2.0)
Acid-base deficit: 3.8 mmol/L — ABNORMAL HIGH (ref 0.0–2.0)
Bicarbonate: 25.7 mmol/L (ref 20.0–28.0)
Bicarbonate: 25.9 mmol/L (ref 20.0–28.0)
Delivery systems: POSITIVE
Drawn by: 10006
Drawn by: 236041
Expiratory PAP: 8
FIO2: 100
FIO2: 80
Inspiratory PAP: 16
MECHVT: 510 mL
Mode: POSITIVE
O2 Saturation: 97 %
O2 Saturation: 99.5 %
PEEP: 5 cmH2O
Patient temperature: 98.6
Patient temperature: 99
RATE: 10 resp/min
RATE: 28 resp/min
pCO2 arterial: 100 mmHg (ref 32.0–48.0)
pCO2 arterial: 46.6 mmHg (ref 32.0–48.0)
pH, Arterial: 7.041 — CL (ref 7.350–7.450)
pH, Arterial: 7.362 (ref 7.350–7.450)
pO2, Arterial: 159 mmHg — ABNORMAL HIGH (ref 83.0–108.0)
pO2, Arterial: 335 mmHg — ABNORMAL HIGH (ref 83.0–108.0)

## 2018-12-18 LAB — PHOSPHORUS
Phosphorus: 1.5 mg/dL — ABNORMAL LOW (ref 2.5–4.6)
Phosphorus: 1.4 mg/dL — ABNORMAL LOW (ref 2.5–4.6)

## 2018-12-18 LAB — CBC
HCT: 38.4 % — ABNORMAL LOW (ref 39.0–52.0)
Hemoglobin: 12.4 g/dL — ABNORMAL LOW (ref 13.0–17.0)
MCH: 29 pg (ref 26.0–34.0)
MCHC: 32.3 g/dL (ref 30.0–36.0)
MCV: 89.7 fL (ref 80.0–100.0)
Platelets: 120 10*3/uL — ABNORMAL LOW (ref 150–400)
RBC: 4.28 MIL/uL (ref 4.22–5.81)
RDW: 12.3 % (ref 11.5–15.5)
WBC: 9.4 10*3/uL (ref 4.0–10.5)
nRBC: 0 % (ref 0.0–0.2)

## 2018-12-18 LAB — GLUCOSE, CAPILLARY
Glucose-Capillary: 146 mg/dL — ABNORMAL HIGH (ref 70–99)
Glucose-Capillary: 155 mg/dL — ABNORMAL HIGH (ref 70–99)
Glucose-Capillary: 159 mg/dL — ABNORMAL HIGH (ref 70–99)
Glucose-Capillary: 173 mg/dL — ABNORMAL HIGH (ref 70–99)

## 2018-12-18 LAB — CULTURE, RESPIRATORY W GRAM STAIN

## 2018-12-18 LAB — MAGNESIUM
Magnesium: 2.2 mg/dL (ref 1.7–2.4)
Magnesium: 2.2 mg/dL (ref 1.7–2.4)

## 2018-12-18 LAB — AMMONIA: Ammonia: 58 umol/L — ABNORMAL HIGH (ref 9–35)

## 2018-12-18 LAB — CULTURE, RESPIRATORY: Special Requests: NORMAL

## 2018-12-18 MED ORDER — SODIUM CHLORIDE 0.9% FLUSH
10.0000 mL | Freq: Two times a day (BID) | INTRAVENOUS | Status: DC
  Administered 2018-12-18 – 2019-01-08 (×27): 10 mL

## 2018-12-18 MED ORDER — ETOMIDATE 2 MG/ML IV SOLN
20.0000 mg | Freq: Once | INTRAVENOUS | Status: AC
  Administered 2018-12-18: 20 mg via INTRAVENOUS

## 2018-12-18 MED ORDER — PANTOPRAZOLE SODIUM 40 MG PO PACK
40.0000 mg | PACK | ORAL | Status: DC
  Administered 2018-12-18 – 2018-12-25 (×8): 40 mg
  Filled 2018-12-18 (×10): qty 20

## 2018-12-18 MED ORDER — ACETAMINOPHEN 325 MG PO TABS
650.0000 mg | ORAL_TABLET | Freq: Four times a day (QID) | ORAL | Status: DC | PRN
  Administered 2018-12-25: 650 mg
  Filled 2018-12-18: qty 2

## 2018-12-18 MED ORDER — PRO-STAT SUGAR FREE PO LIQD
30.0000 mL | Freq: Two times a day (BID) | ORAL | Status: DC
  Administered 2018-12-18 – 2018-12-19 (×3): 30 mL
  Filled 2018-12-18 (×3): qty 30

## 2018-12-18 MED ORDER — NOREPINEPHRINE-SODIUM CHLORIDE 4-0.9 MG/250ML-% IV SOLN
INTRAVENOUS | Status: AC
  Filled 2018-12-18: qty 250

## 2018-12-18 MED ORDER — ORAL CARE MOUTH RINSE
15.0000 mL | OROMUCOSAL | Status: DC
  Administered 2018-12-18 – 2018-12-28 (×97): 15 mL via OROMUCOSAL

## 2018-12-18 MED ORDER — FOLIC ACID 1 MG PO TABS
1.0000 mg | ORAL_TABLET | Freq: Every day | ORAL | Status: DC
  Administered 2018-12-19 – 2018-12-25 (×7): 1 mg
  Filled 2018-12-18 (×10): qty 1

## 2018-12-18 MED ORDER — BISACODYL 10 MG RE SUPP: 10.0000 mg | Freq: Every day | RECTAL | Status: DC | PRN

## 2018-12-18 MED ORDER — SODIUM PHOSPHATES 45 MMOLE/15ML IV SOLN
30.0000 mmol | Freq: Once | INTRAVENOUS | Status: AC
  Administered 2018-12-18: 30 mmol via INTRAVENOUS
  Filled 2018-12-18: qty 10

## 2018-12-18 MED ORDER — ROCURONIUM BROMIDE 50 MG/5ML IV SOLN
50.0000 mg | Freq: Once | INTRAVENOUS | Status: AC
  Administered 2018-12-18: 50 mg via INTRAVENOUS

## 2018-12-18 MED ORDER — VITAMIN B-1 100 MG PO TABS
100.0000 mg | ORAL_TABLET | Freq: Every day | ORAL | Status: DC
  Administered 2018-12-19 – 2018-12-22 (×4): 100 mg
  Filled 2018-12-18 (×5): qty 1

## 2018-12-18 MED ORDER — SODIUM BICARBONATE 8.4 % IV SOLN
INTRAVENOUS | Status: AC
  Filled 2018-12-18: qty 50

## 2018-12-18 MED ORDER — PHENYLEPHRINE 40 MCG/ML (10ML) SYRINGE FOR IV PUSH (FOR BLOOD PRESSURE SUPPORT)
80.0000 ug | PREFILLED_SYRINGE | INTRAVENOUS | Status: DC | PRN
  Filled 2018-12-18 (×3): qty 10

## 2018-12-18 MED ORDER — SODIUM CHLORIDE 0.9% FLUSH
10.0000 mL | INTRAVENOUS | Status: DC | PRN
  Administered 2018-12-27 – 2018-12-30 (×3): 10 mL
  Filled 2018-12-18 (×3): qty 40

## 2018-12-18 MED ORDER — LACTATED RINGERS IV BOLUS
1000.0000 mL | Freq: Once | INTRAVENOUS | Status: AC
  Administered 2018-12-18: 1000 mL via INTRAVENOUS

## 2018-12-18 MED ORDER — VITAL HIGH PROTEIN PO LIQD
1000.0000 mL | ORAL | Status: DC
  Administered 2018-12-18: 1000 mL

## 2018-12-18 MED ORDER — SODIUM BICARBONATE 8.4 % IV SOLN
50.0000 meq | Freq: Once | INTRAVENOUS | Status: AC
  Administered 2018-12-18: 50 meq via INTRAVENOUS

## 2018-12-18 MED ORDER — ACETAMINOPHEN 650 MG RE SUPP
650.0000 mg | Freq: Four times a day (QID) | RECTAL | Status: DC | PRN
  Administered 2018-12-31: 650 mg via RECTAL
  Filled 2018-12-18: qty 1

## 2018-12-18 MED ORDER — INSULIN ASPART 100 UNIT/ML ~~LOC~~ SOLN
0.0000 [IU] | SUBCUTANEOUS | Status: DC
  Administered 2018-12-19: 1 [IU] via SUBCUTANEOUS
  Administered 2018-12-19 (×4): 2 [IU] via SUBCUTANEOUS
  Administered 2018-12-19: 1 [IU] via SUBCUTANEOUS
  Administered 2018-12-20 (×4): 2 [IU] via SUBCUTANEOUS
  Administered 2018-12-20: 3 [IU] via SUBCUTANEOUS
  Administered 2018-12-21 (×2): 2 [IU] via SUBCUTANEOUS
  Administered 2018-12-21 (×2): 1 [IU] via SUBCUTANEOUS
  Administered 2018-12-21: 2 [IU] via SUBCUTANEOUS
  Administered 2018-12-21 (×2): 1 [IU] via SUBCUTANEOUS
  Administered 2018-12-22: 2 [IU] via SUBCUTANEOUS
  Administered 2018-12-22 (×3): 1 [IU] via SUBCUTANEOUS
  Administered 2018-12-22: 2 [IU] via SUBCUTANEOUS
  Administered 2018-12-23 – 2018-12-24 (×6): 1 [IU] via SUBCUTANEOUS

## 2018-12-18 MED ORDER — FENTANYL CITRATE (PF) 100 MCG/2ML IJ SOLN
100.0000 ug | INTRAMUSCULAR | Status: DC | PRN
  Administered 2018-12-18: 50 ug via INTRAVENOUS
  Administered 2018-12-19 – 2018-12-21 (×5): 100 ug via INTRAVENOUS
  Administered 2018-12-22 – 2018-12-27 (×4): 50 ug via INTRAVENOUS
  Filled 2018-12-18 (×4): qty 2

## 2018-12-18 MED ORDER — FENTANYL CITRATE (PF) 100 MCG/2ML IJ SOLN: 100.0000 ug | INTRAMUSCULAR | Status: DC | PRN

## 2018-12-18 MED ORDER — SODIUM CHLORIDE 0.9 % IV SOLN
1.0000 g | INTRAVENOUS | Status: DC
  Administered 2018-12-18: 1 g via INTRAVENOUS
  Filled 2018-12-18 (×3): qty 10

## 2018-12-18 MED ORDER — POTASSIUM CHLORIDE 10 MEQ/100ML IV SOLN
10.0000 meq | INTRAVENOUS | Status: AC
  Administered 2018-12-18 (×2): 10 meq via INTRAVENOUS

## 2018-12-18 MED ORDER — CHLORHEXIDINE GLUCONATE 0.12% ORAL RINSE (MEDLINE KIT)
15.0000 mL | Freq: Two times a day (BID) | OROMUCOSAL | Status: DC
  Administered 2018-12-18 – 2018-12-28 (×19): 15 mL via OROMUCOSAL

## 2018-12-18 NOTE — Progress Notes (Signed)
This rn notes pt with aloc as per hand off report from day shift nurse. Called elink, nurse Jody informed pt with aloc, pt will grimace to painful stimuli and last ammonia level 247. Augusto Gamble will notify Dr Deterding of notable concerns.

## 2018-12-18 NOTE — Procedures (Signed)
Endotracheal Intubation Procedure Note  Indication for endotracheal intubation: respiratory failure. Airway Assessment: Mallampati Class: II (hard and soft palate, upper portion of tonsils anduvula visible). Sedation: etomidate. Paralytic: rocuronium. Lidocaine: no. Atropine: no. Equipment: Macintosh 3 laryngoscope blade. Cricoid Pressure: yes. Number of attempts: 1. ETT location confirmed by by auscultation, by CXR and ETCO2 monitor.  The patient remained hemodynamically stable throughout the procedure and tolerated the positive pressure without concern. Family was called and notified, there were no further questions at this time.   Aida PufferMatthew A Maslonka, MD  Pulmonary and Critical Care Medicine [facility] Pager: [phone]

## 2018-12-18 NOTE — Progress Notes (Signed)
eLink Physician-Brief Progress Note Patient Name: Jeffrey [name] DOB: [birth year] MRN: [id number]   Date of Service  12/18/2018  HPI/Events of Note  Multiple issues: 1. PO4--- = 1.4 and Creatinine = 0.87 and 2. Oliguria.   eICU Interventions  Will order: 1. Replace PO4---. 2. PO4--- level ordered for the AM.  3. Bolus with LR 1 liter IV over 1 hour now.      Intervention Category Major Interventions: Electrolyte abnormality - evaluation and management Intermediate Interventions: Oliguria - evaluation and management  Storie Heffern Eugene 12/18/2018, 9:27 PM

## 2018-12-18 NOTE — Progress Notes (Signed)
Pt sleeping calmly on bipap settings suddenly became agitated with progression to respiratory distress minutes after LPIV dressing change initiated. Pt diaphoretic, hr 140s, rr 30s, sbp 200s. Precedex drip increased, 2mg  ivp ativan given and RT called to bedside. Elink notified and camera visual done by nurse Jody. Augusto Gamble will notify Dr Darrick Penna of change in pt condition and receive confirmation for stat abg and pcxr.

## 2018-12-18 NOTE — Progress Notes (Signed)
Pt placed back on full support at this time due to increased agitation, RR >40 with increased WOB.  Pt tolerating full support well, RT will monitor

## 2018-12-18 NOTE — Progress Notes (Addendum)
   NAME:  Jeffrey [name], MRN:  [id number], DOB:  [birth year], LOS: 2 ADMISSION DATE:  12/16/2018,  REFERRING MD: [facility] emergency department physician, CHIEF COMPLAINT: Altered mental status, elevated ammonia level  Brief History   58 yo male smoker with altered mental status, agitation.  Intubated for airway protection.  Elevated ammonia level (274).  UDS positive for cocaine, THC.  Hx of ETOH, Bipolar, COPD.  Past Medical History  COPD with chronic tobacco abuse Bipolar Polysubstance abuse  Significant Hospital Events   12/16/2018 intubated  Consults:    Procedures:  12/16/2018 intubation>>  Significant Diagnostic Tests:  12/16/2018 CT head >> negative  Micro Data:  1/24 Resp Cx >> Few klebsiella pneumoniae  1/24 Blood Cx >> NG 24 hours 1/24 Urine Culture >> NG (final)   Antimicrobials:    Interim history/subjective:  Failed extubation yesterday, sedated on vent this morning. Resp cultures growing klebsiella   Objective   Blood pressure (!) 89/57, pulse (!) 120, temperature 98.3 F (36.8 C), temperature source Oral, resp. rate (!) 28, height 5\' 6"  (1.676 m), weight 71.4 kg, SpO2 95 %.    Vent Mode: PRVC FiO2 (%):  [30 %-100 %] 40 % Set Rate:  [10 bmp-28 bmp] 20 bmp Vt Set:  [510 mL-560 mL] 510 mL PEEP:  [5 cmH20-8 cmH20] 5 cmH20 Plateau Pressure:  [17 cmH20-19 cmH20] 17 cmH20   Intake/Output Summary (Last 24 hours) at 12/18/2018 0703 Last data filed at 12/18/2018 0200 Gross per 24 hour  Intake 1465.04 ml  Output 1050 ml  Net 415.04 ml   Filed Weights   12/16/18 0315 12/16/18 1217 12/17/18 0200  Weight: 85 kg 69.7 kg 71.4 kg    Examination:  General - sedated Eyes - pupils reactive ENT - ETT in place Cardiac - regular rate/rhythm, no murmur Chest - equal breath sounds b/l, no wheezing or rales Abdomen - soft, non tender, + bowel sounds Extremities - no cyanosis, clubbing, or edema Skin - no rashes Neuro - RASS -3   Resolved Hospital  Problem list     Assessment & Plan:   Acute Hypoxic Respiratory Failure Klebsiella Pneumonia  Hx of COPD with tobacco abuse Failed extubation yesterday. CXR today with worsening bilateral infiltrates, respiratory cultures growing klebsiella  -- Start ceftriaxone, follow sensitivities   -- Full vent support, will attempt to wean again 24-48 hours -- Echocardiogram  -- Stop fluids, start tube feeds -- Scheduled duonebs q6h -- Albuterol prn   Acute metabolic encephalopathy. UDS positive for cocaine, THC. Hx of ETOH. Hyperammonemia. Plan -- limit sedation -- monitor for withdrawal -- thiamine, folic acid -- f/u ammonia level; continue lactulose  Hx of bipolar disease. Plan -- hold outpt depakote, zyprexa, prozac, trazodone for now   Best practice:  Diet: Tube feeds DVT prophylaxis: Lovenox GI prophylaxis: Protonix Mobility: Bedrest Code Status: Full Family Communication: None at bedside  Reymundo Poll, M.D. - PGY3 Pager: [phone] 12/18/2018, 7:04 AM

## 2018-12-18 NOTE — Progress Notes (Signed)
Consult received to call RN. Spoke with RN and answered questions regarding Midline use for lab work per protocol.

## 2018-12-19 ENCOUNTER — Inpatient Hospital Stay (HOSPITAL_COMMUNITY): Payer: Medicare Other

## 2018-12-19 DIAGNOSIS — J80 Acute respiratory distress syndrome: Secondary | ICD-10-CM

## 2018-12-19 LAB — CBC
HCT: 34.9 % — ABNORMAL LOW (ref 39.0–52.0)
Hemoglobin: 11.1 g/dL — ABNORMAL LOW (ref 13.0–17.0)
MCH: 28.8 pg (ref 26.0–34.0)
MCHC: 31.8 g/dL (ref 30.0–36.0)
MCV: 90.4 fL (ref 80.0–100.0)
Platelets: 122 10*3/uL — ABNORMAL LOW (ref 150–400)
RBC: 3.86 MIL/uL — ABNORMAL LOW (ref 4.22–5.81)
RDW: 12.6 % (ref 11.5–15.5)
WBC: 8.6 10*3/uL (ref 4.0–10.5)
nRBC: 0 % (ref 0.0–0.2)

## 2018-12-19 LAB — BASIC METABOLIC PANEL
Anion gap: 6 (ref 5–15)
BUN: 29 mg/dL — ABNORMAL HIGH (ref 6–20)
CO2: 28 mmol/L (ref 22–32)
Calcium: 8.2 mg/dL — ABNORMAL LOW (ref 8.9–10.3)
Chloride: 107 mmol/L (ref 98–111)
Creatinine, Ser: 0.76 mg/dL (ref 0.61–1.24)
GFR calc Af Amer: >60 mL/min (ref >60)
GFR calc non Af Amer: >60 mL/min (ref >60)
Glucose, Bld: 154 mg/dL — ABNORMAL HIGH (ref 70–99)
Potassium: 3.8 mmol/L (ref 3.5–5.1)
Sodium: 141 mmol/L (ref 135–145)

## 2018-12-19 LAB — GLUCOSE, CAPILLARY
Glucose-Capillary: 133 mg/dL — ABNORMAL HIGH (ref 70–99)
Glucose-Capillary: 138 mg/dL — ABNORMAL HIGH (ref 70–99)
Glucose-Capillary: 147 mg/dL — ABNORMAL HIGH (ref 70–99)
Glucose-Capillary: 148 mg/dL — ABNORMAL HIGH (ref 70–99)
Glucose-Capillary: 154 mg/dL — ABNORMAL HIGH (ref 70–99)
Glucose-Capillary: 156 mg/dL — ABNORMAL HIGH (ref 70–99)
Glucose-Capillary: 184 mg/dL — ABNORMAL HIGH (ref 70–99)

## 2018-12-19 LAB — PHOSPHORUS
Phosphorus: 3.3 mg/dL (ref 2.5–4.6)
Phosphorus: 2.2 mg/dL — ABNORMAL LOW (ref 2.5–4.6)

## 2018-12-19 LAB — ECHOCARDIOGRAM COMPLETE
Height: 66 in
Weight: 2518.54 oz

## 2018-12-19 LAB — FOLATE RBC
Folate, Hemolysate: 317 ng/mL
Folate, RBC: 841 ng/mL (ref >498)
Hematocrit: 37.7 % (ref 37.5–51.0)

## 2018-12-19 LAB — MAGNESIUM
Magnesium: 2.2 mg/dL (ref 1.7–2.4)
Magnesium: 2.1 mg/dL (ref 1.7–2.4)

## 2018-12-19 LAB — TRIGLYCERIDES: Triglycerides: 90 mg/dL (ref <150)

## 2018-12-19 LAB — AMMONIA: Ammonia: 47 umol/L — ABNORMAL HIGH (ref 9–35)

## 2018-12-19 MED ORDER — CEFAZOLIN SODIUM-DEXTROSE 2-4 GM/100ML-% IV SOLN
2.0000 g | Freq: Three times a day (TID) | INTRAVENOUS | Status: DC
  Administered 2018-12-19 – 2018-12-20 (×3): 2 g via INTRAVENOUS
  Filled 2018-12-19 (×4): qty 100

## 2018-12-19 MED ORDER — LORAZEPAM 2 MG/ML IJ SOLN
1.0000 mg | Freq: Four times a day (QID) | INTRAMUSCULAR | Status: DC
  Administered 2018-12-19 – 2018-12-20 (×3): 1 mg via INTRAVENOUS
  Filled 2018-12-19 (×3): qty 1

## 2018-12-19 MED ORDER — OLANZAPINE 7.5 MG PO TABS: 15.0000 mg | ORAL_TABLET | Freq: Every day | ORAL | Status: DC

## 2018-12-19 MED ORDER — VITAL AF 1.2 CAL PO LIQD
1000.0000 mL | ORAL | Status: DC
  Administered 2018-12-19 – 2018-12-22 (×2): 1000 mL

## 2018-12-19 MED ORDER — FUROSEMIDE 40 MG PO TABS
20.0000 mg | ORAL_TABLET | Freq: Every day | ORAL | Status: DC
  Administered 2018-12-19 – 2018-12-20 (×2): 20 mg
  Filled 2018-12-19 (×2): qty 1

## 2018-12-19 MED ORDER — PROPOFOL 1000 MG/100ML IV EMUL: 5.0000 ug/kg/min | INTRAVENOUS | Status: DC

## 2018-12-19 MED ORDER — VALPROIC ACID 250 MG/5ML PO SOLN
750.0000 mg | Freq: Two times a day (BID) | ORAL | Status: DC
  Administered 2018-12-19: 750 mg
  Filled 2018-12-19: qty 15

## 2018-12-19 MED ORDER — CEFAZOLIN SODIUM-DEXTROSE 2-4 GM/100ML-% IV SOLN
2.0000 g | Freq: Three times a day (TID) | INTRAVENOUS | Status: DC
  Administered 2018-12-19: 2 g via INTRAVENOUS
  Filled 2018-12-19 (×2): qty 100

## 2018-12-19 MED ORDER — CLONIDINE HCL 0.2 MG PO TABS
0.2000 mg | ORAL_TABLET | Freq: Four times a day (QID) | ORAL | Status: DC
  Administered 2018-12-19 – 2018-12-21 (×8): 0.2 mg via ORAL
  Filled 2018-12-19 (×9): qty 1

## 2018-12-19 MED ORDER — FLUOXETINE HCL 20 MG PO CAPS
40.0000 mg | ORAL_CAPSULE | Freq: Every day | ORAL | Status: DC
  Administered 2018-12-19: 40 mg
  Filled 2018-12-19: qty 2

## 2018-12-19 MED ORDER — SODIUM PHOSPHATES 45 MMOLE/15ML IV SOLN
20.0000 mmol | Freq: Once | INTRAVENOUS | Status: AC
  Administered 2018-12-19: 20 mmol via INTRAVENOUS
  Filled 2018-12-19: qty 6.67

## 2018-12-19 NOTE — Progress Notes (Signed)
EEG complete - results pending 

## 2018-12-19 NOTE — Progress Notes (Addendum)
Nutrition Follow-up / Consult  DOCUMENTATION CODES:   Not applicable  INTERVENTION:   Change TF to meet estimated needs:  Vital AF 1.2 at 65 ml/h (1560 ml per day)  Provides 1872 kcal, 117 gm protein, 1265 ml free water daily  NUTRITION DIAGNOSIS:   Inadequate oral intake related to inability to eat as evidenced by NPO status.  Ongoing   GOAL:   Patient will meet greater than or equal to 90% of their needs  Being addressed with TF  MONITOR:   Vent status, TF tolerance, Labs, I & O's  REASON FOR ASSESSMENT:   Consult Enteral/tube feeding initiation and management  ASSESSMENT:   58 yo male with PMH of [name], bipolar d/o, alcohol & marajuana abuse, [name], COPD, HTN who was admitted AMS, elevated ammonia level requiring intubation on admission.  Patient was extubated on 1/25, but required re-intubation on 1/26. TF was held during time off vent, but was resumed after re-intubation 1/26. Currently receiving Vital High Protein at 40 ml/h with Pro-stat 30 ml BID per the Adult TF Protocol.   Respiratory culture + Klebsiella pneumoniae 1/24  Patient is currently intubated on ventilator support MV: 13 L/min Temp (24hrs), Avg:99 F (37.2 C), Min:98.8 F (37.1 C), Max:99.2 F (37.3 C)   Labs reviewed. CBG's: 147-133 Medications reviewed and include folic acid, novolog, lactulose, solumedrol, thiamine, precedex.    Diet Order:   Diet Order            Diet NPO time specified  Diet effective now              EDUCATION NEEDS:   No education needs have been identified at this time  Skin:  Skin Assessment: Reviewed RN Assessment  Last BM:  1/27 (type 7)  Height:   Ht Readings from Last 1 Encounters:  12/18/18 5\' 6"  (1.676 m)    Weight:   Wt Readings from Last 1 Encounters:  12/17/18 71.4 kg    Ideal Body Weight:  64.5 kg  BMI:  Body mass index is 25.41 kg/m.  Estimated Nutritional Needs:   Kcal:  1825  Protein:  100-115 gm  Fluid:  >/= 1.7  L    Joaquin CourtsKimberly Lance [place]ar[place], RD, LDN, CNSC Pager [phone] After Hours Pager [phone]

## 2018-12-19 NOTE — Progress Notes (Signed)
Called to bedside for witnessed episode of worsening agitation, tachycardia, hypertension, and forced upward gaze deviation requiring 4 mg of ativan to break. Patient is on CIWA, last prn ativan received yesterday at 16:29. Over very concerning for alcohol withdrawal seizure. Vitals are improving after ativan. Placing restraints for safety. Will obtain EEG and schedule ativan 1mg  q6h, next dose at 16:00. Continue CIWA with additional PRN ativan as needed. Adding propofol for additional sedation as needed.   Reymundo Poll, M.D. - PGY3 Pager: [phone] 12/19/2018, 12:28 PM

## 2018-12-19 NOTE — Progress Notes (Signed)
  Echocardiogram 2D Echocardiogram has been performed.  Jode Lippe G Layce Sprung 12/19/2018, 1:30 PM

## 2018-12-19 NOTE — Procedures (Signed)
ELECTROENCEPHALOGRAM REPORT   Patient: Jeffrey [name]       Room #: 7E08X EEG No. ID: [id number] Age: 58 y.o.        Sex: male Referring Physician: Craige Cotta Report Date:  12/19/2018        Interpreting Physician: Thana Farr  History: Jeffrey [name] is an 58 y.o. male with altered mental status  Medications:  Ancef, Catapres, Folvite, Lasix, Insulin, Lactulose, Solumedrol, Pecedex, Protonix,   Conditions of Recording:  This is a 21 channel routine scalp EEG performed with bipolar and monopolar montages arranged in accordance to the international 10/20 system of electrode placement. One channel was dedicated to EKG recording.  The patient is in the intubated and sedated state.  Ativan was administered prior to the recording.    Description:  The background activity is slow and poorly organized.  It consists of a slow polymorphic delta rhythm that is very low voltage.  This activity is diffusely distributed.  Sweat artifact is prominent throughout the recording as well.  No epileptiform activity is noted.  Hyperventilation and intermittent photic stimulation were not performed  IMPRESSION: This is an abnormal EEG secondary to general background slowing.  This finding may be seen with a diffuse disturbance that is etiologically nonspecific, but may include a metabolic encephalopathy or medication effect, among other possibilities.  No epileptiform activity was noted.     Thana Farr, MD Neurology [phone] 12/19/2018, 5:39 PM

## 2018-12-19 NOTE — Progress Notes (Addendum)
Pharmacy Antibiotic Note  Jeffrey [name] is a 58 y.o. male admitted on 12/16/2018 with pneumonia.  Pharmacy has been consulted for Cefazolin dosing. Patient has grown klebsiella pneumonia in the 1/24 sputum culture (resistant only to ampicilin).  Patient WBC stable at 8.6, scr improved 0.87>0.76, afebrile. Patient is on day 2 of antibiotics.   Plan: - Stop Ceftriaxone - Cefazolin 2 grams IV every 8 hours - Follow-up clinical improvement, signs/symptoms of infection, renal function, further cultures  Height: 5\' 6"  (167.6 cm)(measured at bedside) Weight: 157 lb 6.5 oz (71.4 kg) IBW/kg (Calculated) : 63.8  Temp (24hrs), Avg:98.8 F (37.1 C), Min:98 F (36.7 C), Max:99 F (37.2 C)  Recent Labs  Lab 12/16/18 0107 12/16/18 1314 12/17/18 0325 12/18/18 0423 12/19/18 0606  WBC 10.2 7.4 7.8 9.4 8.6  CREATININE 0.89 0.75 0.77 0.87 0.76  LATICACIDVEN  --  1.4  --   --   --     Estimated Creatinine Clearance: 90.8 mL/min (by C-G formula based on SCr of 0.76 mg/dL).    Allergies  Allergen Reactions  . Augmentin [Amoxicillin-Pot Clavulanate] Rash and Other (See Comments)    Has patient had a PCN reaction causing immediate rash, facial/tongue/throat swelling, SOB or lightheadedness with hypotension: No Has patient had a PCN reaction causing severe rash involving mucus membranes or skin necrosis: No Has patient had a PCN reaction that required hospitalization No Has patient had a PCN reaction occurring within the last 10 years: Yes If all of the above answers are "NO", then may proceed with Cephalosporin use.  . Ace Inhibitors Hives    Antimicrobials this admission: Ceftriaxone 1/26 >> 1/27 Cefazolin 1/27 >>  Dose adjustments this admission: N/A  Microbiology results: 1/26 TACx: GPC / GNR 1/24 TACx: Klebsiella pneumonia - pan-sens except ampicillin  1/24 BCxx: ngtd x2 days 1/24 UCx: ngtd x2 days  1/24 MRSA PCR: Negative  Thank you for allowing pharmacy to be a part of this  patient's care.  Bradley Ferris, PharmD 12/19/2018 12:07 PM PGY-1 Pharmacy Resident Direct Phone: [phone] Please check [email or web address] for unit-specific pharmacist phone numbers

## 2018-12-19 NOTE — Progress Notes (Addendum)
   NAME:  Jeffrey [name], MRN:  [id number], DOB:  [birth year], LOS: 3 ADMISSION DATE:  12/16/2018,  REFERRING MD: [facility] emergency department physician, CHIEF COMPLAINT: Altered mental status, elevated ammonia level  Brief History   58 yo male smoker with altered mental status, agitation.  Intubated for airway protection.  Elevated ammonia level (274).  UDS positive for cocaine, THC.  Hx of ETOH, Bipolar, COPD.  Past Medical History  COPD with chronic tobacco abuse Bipolar Polysubstance abuse  Significant Hospital Events   12/16/2018 intubated  Consults:    Procedures:  12/16/2018 intubation>> 1/25 Failed extubation, re-intubated   Significant Diagnostic Tests:  12/16/2018 CT head >> negative  Micro Data:  1/24 Resp Cx >> Klebsiella pneumoniae  1/24 Blood Cx >> NG 2 days 1/24 Urine Culture >> NG (final)   Antimicrobials:  1/26 Ceftriaxone >> dc'd 1/27 1/27 Ancef >>   Interim history/subjective:  Sedated on vent. Daughter at bedside, reports long history of psychiatric illness with multiple suicide attempts.   Objective   Blood pressure (!) 101/55, pulse (!) 109, temperature 98.9 F (37.2 C), temperature source Oral, resp. rate 20, height 5\' 6"  (1.676 m), weight 71.4 kg, SpO2 96 %.    Vent Mode: PRVC FiO2 (%):  [40 %] 40 % Set Rate:  [20 bmp] 20 bmp Vt Set:  [510 mL] 510 mL PEEP:  [5 cmH20] 5 cmH20 Pressure Support:  [10 cmH20] 10 cmH20 Plateau Pressure:  [14 cmH20-22 cmH20] 15 cmH20   Intake/Output Summary (Last 24 hours) at 12/19/2018 7425 Last data filed at 12/19/2018 0600 Gross per 24 hour  Intake 1031.6 ml  Output 475 ml  Net 556.6 ml   Filed Weights   12/16/18 0315 12/16/18 1217 12/17/18 0200  Weight: 85 kg 69.7 kg 71.4 kg    Examination:  General - sedated Eyes - pupils reactive ENT - ETT in place Cardiac - regular rate/rhythm, no murmur Chest - equal breath sounds b/l, no wheezing or rales Abdomen - soft, non tender, + bowel  sounds Extremities - no cyanosis, clubbing, or edema Skin - no rashes Neuro - RASS -3   Resolved Hospital Problem list     Assessment & Plan:   Acute Hypoxic Respiratory Failure Klebsiella Pneumonia  Likely Aspiration  Hx of COPD with tobacco abuse Failed extubation 1/25. CXR today with worsening L>R infiltrates, respiratory cultures growing klebsiella  -- Narrow Ceftriaxone >> Ancef (day 2) -- Full vent support, will attempt to wean again tomorrow -- Stop fluids, started tube feeds 1/26 -- Scheduled duonebs q6h -- Albuterol prn   Acute Metabolic Encephalopathy UDS + cocaine, THC Hx of Alcohol Use Disorder Hyperammonemia -- Limit sedation -- Precedex for RASS goal 0 to -1 -- Monitor for withdrawal -- Thiamine, folic acid -- F/u ammonia level; continue lactulose -- Check RUQ ultrasound to evaluate for cirrhosis   Hx Diastolic HF Hx HTN -- Holding home metoprolol -- Resume home lasix 20 per tube today  -- Repeat Echo pending   Hx of bipolar disease. -- Continue holding home depakote, zyprexa, prozac, and trazadone given mental status   Best practice:  Diet: Tube feeds DVT prophylaxis: Lovenox GI prophylaxis: Protonix Mobility: Bedrest Code Status: Full Family Communication: Updated daughter at bedside, 1/27  Reymundo Poll, M.D. - PGY3 Pager: [phone] 12/19/2018, 7:12 AM

## 2018-12-19 NOTE — Plan of Care (Signed)
  Problem: Clinical Measurements: Goal: Will remain free from infection Outcome: Progressing Goal: Diagnostic test results will improve Outcome: Progressing Goal: Respiratory complications will improve Outcome: Progressing Goal: Cardiovascular complication will be avoided Outcome: Progressing   Problem: Nutrition: Goal: Adequate nutrition will be maintained Outcome: Progressing   Problem: Coping: Goal: Level of anxiety will decrease Outcome: Progressing   Problem: Elimination: Goal: Will not experience complications related to bowel motility Outcome: Progressing Goal: Will not experience complications related to urinary retention Outcome: Progressing   Problem: Pain Managment: Goal: General experience of comfort will improve Outcome: Progressing   Problem: Safety: Goal: Ability to remain free from injury will improve Outcome: Progressing   Problem: Skin Integrity: Goal: Risk for impaired skin integrity will decrease Outcome: Progressing   Problem: Education: Goal: Knowledge of General Education information will improve Description Including pain rating scale, medication(s)/side effects and non-pharmacologic comfort measures Outcome: Not Progressing   Problem: Health Behavior/Discharge Planning: Goal: Ability to manage health-related needs will improve Outcome: Not Progressing   Problem: Activity: Goal: Risk for activity intolerance will decrease Outcome: Not Progressing

## 2018-12-20 ENCOUNTER — Inpatient Hospital Stay (HOSPITAL_COMMUNITY): Payer: Medicare Other

## 2018-12-20 LAB — PHOSPHORUS: Phosphorus: 3.1 mg/dL (ref 2.5–4.6)

## 2018-12-20 LAB — CBC
HCT: 36.5 % — ABNORMAL LOW (ref 39.0–52.0)
Hemoglobin: 11.8 g/dL — ABNORMAL LOW (ref 13.0–17.0)
MCH: 29.5 pg (ref 26.0–34.0)
MCHC: 32.3 g/dL (ref 30.0–36.0)
MCV: 91.3 fL (ref 80.0–100.0)
Platelets: 164 10*3/uL (ref 150–400)
RBC: 4 MIL/uL — ABNORMAL LOW (ref 4.22–5.81)
RDW: 13.1 % (ref 11.5–15.5)
WBC: 12.3 10*3/uL — ABNORMAL HIGH (ref 4.0–10.5)
nRBC: 0 % (ref 0.0–0.2)

## 2018-12-20 LAB — BASIC METABOLIC PANEL
Anion gap: 10 (ref 5–15)
BUN: 32 mg/dL — ABNORMAL HIGH (ref 6–20)
CO2: 27 mmol/L (ref 22–32)
Calcium: 8.5 mg/dL — ABNORMAL LOW (ref 8.9–10.3)
Chloride: 106 mmol/L (ref 98–111)
Creatinine, Ser: 0.75 mg/dL (ref 0.61–1.24)
GFR calc Af Amer: >60 mL/min (ref >60)
GFR calc non Af Amer: >60 mL/min (ref >60)
Glucose, Bld: 209 mg/dL — ABNORMAL HIGH (ref 70–99)
Potassium: 3.2 mmol/L — ABNORMAL LOW (ref 3.5–5.1)
Sodium: 143 mmol/L (ref 135–145)

## 2018-12-20 LAB — GLUCOSE, CAPILLARY
Glucose-Capillary: 187 mg/dL — ABNORMAL HIGH (ref 70–99)
Glucose-Capillary: 160 mg/dL — ABNORMAL HIGH (ref 70–99)
Glucose-Capillary: 172 mg/dL — ABNORMAL HIGH (ref 70–99)
Glucose-Capillary: 204 mg/dL — ABNORMAL HIGH (ref 70–99)
Glucose-Capillary: 193 mg/dL — ABNORMAL HIGH (ref 70–99)

## 2018-12-20 LAB — CULTURE, RESPIRATORY

## 2018-12-20 LAB — CULTURE, RESPIRATORY W GRAM STAIN

## 2018-12-20 LAB — MAGNESIUM: Magnesium: 2.1 mg/dL (ref 1.7–2.4)

## 2018-12-20 MED ORDER — POTASSIUM CHLORIDE 20 MEQ/15ML (10%) PO SOLN
40.0000 meq | Freq: Once | ORAL | Status: AC
  Administered 2018-12-20: 40 meq
  Filled 2018-12-20: qty 30

## 2018-12-20 MED ORDER — PREDNISONE 20 MG PO TABS
40.0000 mg | ORAL_TABLET | Freq: Every day | ORAL | Status: AC
  Administered 2018-12-21 – 2018-12-22 (×2): 40 mg via ORAL
  Filled 2018-12-20 (×2): qty 2

## 2018-12-20 MED ORDER — FUROSEMIDE 10 MG/ML IJ SOLN
20.0000 mg | Freq: Every day | INTRAMUSCULAR | Status: DC
  Administered 2018-12-20: 20 mg via INTRAVENOUS
  Filled 2018-12-20: qty 2

## 2018-12-20 MED ORDER — SODIUM CHLORIDE 0.9 % IV SOLN
INTRAVENOUS | Status: DC | PRN
  Administered 2018-12-20: 13:00:00 via INTRAVENOUS
  Administered 2018-12-27: 10 mL/h via INTRAVENOUS

## 2018-12-20 MED ORDER — POTASSIUM CHLORIDE 20 MEQ/15ML (10%) PO SOLN: 40.0000 meq | Freq: Once | ORAL | Status: DC

## 2018-12-20 MED ORDER — SODIUM CHLORIDE 0.9 % IV SOLN
2.0000 g | INTRAVENOUS | Status: AC
  Administered 2018-12-20 – 2018-12-26 (×7): 2 g via INTRAVENOUS
  Filled 2018-12-20 (×7): qty 20

## 2018-12-20 MED ORDER — LORAZEPAM 2 MG/ML IJ SOLN
0.5000 mg | Freq: Four times a day (QID) | INTRAMUSCULAR | Status: DC
  Administered 2018-12-20 – 2018-12-21 (×3): 0.5 mg via INTRAVENOUS
  Filled 2018-12-20 (×3): qty 1

## 2018-12-20 NOTE — Progress Notes (Addendum)
NAME:  Jeffrey [name], MRN:  [id number], DOB:  1961/05/06, LOS: 4 ADMISSION DATE:  12/16/2018,  REFERRING MD: [facility] emergency department physician, CHIEF COMPLAINT: Altered mental status, elevated ammonia level  Brief History   58 yo male smoker with altered mental status, agitation.  Intubated for airway protection.  Elevated ammonia level (274).  UDS positive for cocaine, THC.  Hx of ETOH, Bipolar, COPD.  Past Medical History  COPD with chronic tobacco abuse Bipolar Polysubstance abuse  Significant Hospital Events   12/16/2018 intubated 1/27 alcohol withdrawal seizure   Consults:    Procedures:  12/16/2018 intubation>> 1/25 Failed extubation, re-intubated   Significant Diagnostic Tests:  12/16/2018 CT head >> negative  Micro Data:  1/24 Resp Cx >> Klebsiella pneumoniae  1/24 Blood Cx >> NG 2 days 1/24 Urine Culture >> NG (final)  1/26 Resp Cx >> Strep Pneumoniae   Antimicrobials:  1/26 Ceftriaxone >> dc'd 1/27 1/27 Ancef >> dc'd 1/28 1/28 >> Ceftriaxone   Interim history/subjective:  Sedated this morning. Scoring high on CIWA yesterday afternoon but improved with scheduled ativan. Low dose precedex this morning. Was off yesterday but turned back on overnight.   Objective   Blood pressure 121/70, pulse (!) 102, temperature 98.8 F (37.1 C), temperature source Oral, resp. rate 19, height 5\' 6"  (1.676 m), weight 71.4 kg, SpO2 93 %.    Vent Mode: PRVC FiO2 (%):  [40 %] 40 % Set Rate:  [20 bmp] 20 bmp Vt Set:  [510 mL] 510 mL PEEP:  [5 cmH20] 5 cmH20 Pressure Support:  [10 cmH20] 10 cmH20 Plateau Pressure:  [15 cmH20-17 cmH20] 16 cmH20   Intake/Output Summary (Last 24 hours) at 12/20/2018 7829 Last data filed at 12/20/2018 0400 Gross per 24 hour  Intake 1805.2 ml  Output 690 ml  Net 1115.2 ml   Filed Weights   12/16/18 1217 12/17/18 0200 12/20/18 0403  Weight: 69.7 kg 71.4 kg 71.4 kg    Examination:  General - sedated Eyes - pupils  reactive ENT - ETT in place Cardiac - regular rate/rhythm, no murmur Chest - equal breath sounds b/l, no wheezing or rales Abdomen - soft, non tender, + bowel sounds Extremities - no cyanosis, clubbing, or edema Skin - no rashes Neuro - RASS -3  Resolved Hospital Problem list     Assessment & Plan:   Acute Hypoxic Respiratory Failure: Due to klebsiella & Streptococcus pneumonia. Also has COPD with current tobacco use. Failed extubation 1/25. Plan for extubation once mental status improves. -- Currently on ancef >> Change back to ceftriaxone (day 1) -- Full vent support -- Daily SBT -- Scheduled duonebs q6h -- Albuterol prn   Acute Metabolic Encephalopathy: With alcohol withdrawal seizure on 1/27, UDS + for cocaine and THC. Initially treating with lactulose, liver parenchyma appears normal on abdominal US. Will hold further lactulose for now.  -- Ativan 1 mg q6h scheduled for alcohol withdrawal  -- CIWA with PRN ativan  -- Weaning Precedex as able  -- Thiamine, folic acid  Hx Diastolic HF Hx HTN Net positive 5L since admission, only 600 cc output yesterday with PO lasix. Repeat echocardiogram yesterday with preserved EF 65-75%, G2DD, normal IVC.   -- Holding home metoprolol -- Change lasix 20 per tube -> 20 IV   Hx of bipolar disease. -- Continue holding home depakote, zyprexa, prozac, and trazadone given mental status   Best practice:  Diet: Tube feeds DVT prophylaxis: Lovenox GI prophylaxis: Protonix Mobility: Bedrest Code Status: Full Family  Communication: Updated daughter at bedside, 1/28  Reymundo Poll, M.D. - PGY3 Pager: [phone] 12/20/2018, 7:05 AM

## 2018-12-20 NOTE — Progress Notes (Signed)
eLink Physician-Brief Progress Note Patient Name: Jeffrey [name] DOB: 1961/04/29 MRN: [id number]   Date of Service  12/20/2018  HPI/Events of Note  K+ = 3.2 and Creatinine = 0.75.  eICU Interventions  Will replace K+.      Intervention Category Major Interventions: Electrolyte abnormality - evaluation and management  Sommer,Steven Eugene 12/20/2018, 5:47 AM

## 2018-12-21 DIAGNOSIS — J13 Pneumonia due to Streptococcus pneumoniae: Secondary | ICD-10-CM

## 2018-12-21 DIAGNOSIS — J150 Pneumonia due to Klebsiella pneumoniae: Secondary | ICD-10-CM

## 2018-12-21 LAB — GLUCOSE, CAPILLARY
Glucose-Capillary: 130 mg/dL — ABNORMAL HIGH (ref 70–99)
Glucose-Capillary: 132 mg/dL — ABNORMAL HIGH (ref 70–99)
Glucose-Capillary: 138 mg/dL — ABNORMAL HIGH (ref 70–99)
Glucose-Capillary: 141 mg/dL — ABNORMAL HIGH (ref 70–99)
Glucose-Capillary: 160 mg/dL — ABNORMAL HIGH (ref 70–99)
Glucose-Capillary: 168 mg/dL — ABNORMAL HIGH (ref 70–99)
Glucose-Capillary: 191 mg/dL — ABNORMAL HIGH (ref 70–99)

## 2018-12-21 LAB — POCT I-STAT 7, (LYTES, BLD GAS, ICA,H+H)
Acid-Base Excess: 9 mmol/L — ABNORMAL HIGH (ref 0.0–2.0)
Bicarbonate: 33.1 mmol/L — ABNORMAL HIGH (ref 20.0–28.0)
Calcium, Ion: 1.27 mmol/L (ref 1.15–1.40)
HCT: 33 % — ABNORMAL LOW (ref 39.0–52.0)
Hemoglobin: 11.2 g/dL — ABNORMAL LOW (ref 13.0–17.0)
O2 Saturation: 84 %
Patient temperature: 98
Potassium: 4.4 mmol/L (ref 3.5–5.1)
Sodium: 145 mmol/L (ref 135–145)
TCO2: 34 mmol/L — ABNORMAL HIGH (ref 22–32)
pCO2 arterial: 43.2 mmHg (ref 32.0–48.0)
pH, Arterial: 7.491 — ABNORMAL HIGH (ref 7.350–7.450)
pO2, Arterial: 45 mmHg — ABNORMAL LOW (ref 83.0–108.0)

## 2018-12-21 LAB — PHOSPHORUS: Phosphorus: 2.4 mg/dL — ABNORMAL LOW (ref 2.5–4.6)

## 2018-12-21 LAB — CBC
HCT: 38.3 % — ABNORMAL LOW (ref 39.0–52.0)
Hemoglobin: 11.9 g/dL — ABNORMAL LOW (ref 13.0–17.0)
MCH: 28.7 pg (ref 26.0–34.0)
MCHC: 31.1 g/dL (ref 30.0–36.0)
MCV: 92.3 fL (ref 80.0–100.0)
Platelets: 221 10*3/uL (ref 150–400)
RBC: 4.15 MIL/uL — ABNORMAL LOW (ref 4.22–5.81)
RDW: 13.2 % (ref 11.5–15.5)
WBC: 14.4 10*3/uL — ABNORMAL HIGH (ref 4.0–10.5)
nRBC: 0.1 % (ref 0.0–0.2)

## 2018-12-21 LAB — CULTURE, BLOOD (ROUTINE X 2)
Culture: NO GROWTH
Culture: NO GROWTH
Special Requests: ADEQUATE
Special Requests: ADEQUATE

## 2018-12-21 LAB — BASIC METABOLIC PANEL
Anion gap: 9 (ref 5–15)
BUN: 35 mg/dL — ABNORMAL HIGH (ref 6–20)
CO2: 30 mmol/L (ref 22–32)
Calcium: 8.9 mg/dL (ref 8.9–10.3)
Chloride: 108 mmol/L (ref 98–111)
Creatinine, Ser: 0.7 mg/dL (ref 0.61–1.24)
GFR calc Af Amer: >60 mL/min (ref >60)
GFR calc non Af Amer: >60 mL/min (ref >60)
Glucose, Bld: 148 mg/dL — ABNORMAL HIGH (ref 70–99)
Potassium: 4.3 mmol/L (ref 3.5–5.1)
Sodium: 147 mmol/L — ABNORMAL HIGH (ref 135–145)

## 2018-12-21 LAB — BLOOD GAS, ARTERIAL
Acid-Base Excess: 7.6 mmol/L — ABNORMAL HIGH (ref 0.0–2.0)
Bicarbonate: 31.6 mmol/L — ABNORMAL HIGH (ref 20.0–28.0)
Drawn by: 249101
FIO2: 40
MECHVT: 510 mL
O2 Saturation: 93.9 %
PEEP: 5 cmH2O
Patient temperature: 99
RATE: 20 resp/min
pCO2 arterial: 44.1 mmHg (ref 32.0–48.0)
pH, Arterial: 7.469 — ABNORMAL HIGH (ref 7.350–7.450)
pO2, Arterial: 69 mmHg — ABNORMAL LOW (ref 83.0–108.0)

## 2018-12-21 LAB — MAGNESIUM: Magnesium: 2.1 mg/dL (ref 1.7–2.4)

## 2018-12-21 MED ORDER — FUROSEMIDE 10 MG/ML IJ SOLN
40.0000 mg | Freq: Every day | INTRAMUSCULAR | Status: DC
  Administered 2018-12-21 – 2018-12-24 (×4): 40 mg via INTRAVENOUS
  Filled 2018-12-21 (×4): qty 4

## 2018-12-21 MED ORDER — FREE WATER
200.0000 mL | Freq: Three times a day (TID) | Status: DC
  Administered 2018-12-21 – 2018-12-23 (×8): 200 mL

## 2018-12-21 MED ORDER — QUETIAPINE FUMARATE 25 MG PO TABS
50.0000 mg | ORAL_TABLET | Freq: Every day | ORAL | Status: DC
  Administered 2018-12-21: 50 mg
  Filled 2018-12-21: qty 2

## 2018-12-21 MED ORDER — CLONIDINE HCL 0.3 MG PO TABS
0.3000 mg | ORAL_TABLET | Freq: Four times a day (QID) | ORAL | Status: DC
  Administered 2018-12-21 – 2018-12-22 (×4): 0.3 mg via ORAL
  Filled 2018-12-21 (×5): qty 1

## 2018-12-21 MED ORDER — LORAZEPAM 2 MG/ML IJ SOLN: 0.5000 mg | Freq: Two times a day (BID) | INTRAMUSCULAR | Status: DC

## 2018-12-21 MED ORDER — LORAZEPAM 2 MG/ML IJ SOLN
0.5000 mg | Freq: Four times a day (QID) | INTRAMUSCULAR | Status: DC
  Administered 2018-12-21 – 2018-12-22 (×3): 0.5 mg via INTRAVENOUS
  Filled 2018-12-21 (×3): qty 1

## 2018-12-21 MED ORDER — FENTANYL 2500MCG IN NS 250ML (10MCG/ML) PREMIX INFUSION
0.0000 ug/h | INTRAVENOUS | Status: DC
  Administered 2018-12-21: 50 ug/h via INTRAVENOUS
  Administered 2018-12-21: 25 ug/h via INTRAVENOUS
  Administered 2018-12-23 – 2018-12-24 (×2): 100 ug/h via INTRAVENOUS
  Administered 2018-12-26: 50 ug/h via INTRAVENOUS
  Administered 2018-12-27: 150 ug/h via INTRAVENOUS
  Administered 2018-12-28: 125 ug/h via INTRAVENOUS
  Filled 2018-12-21 (×7): qty 250

## 2018-12-21 NOTE — Progress Notes (Addendum)
NAME:  Jeffrey [name], MRN:  [id number], DOB:  06/06/1961, LOS: 5 ADMISSION DATE:  12/16/2018,  REFERRING MD: [facility] emergency department physician, CHIEF COMPLAINT: Altered mental status, elevated ammonia level  Brief History   58 yo male smoker with altered mental status, agitation.  Intubated for airway protection.  Elevated ammonia level (274).  UDS positive for cocaine, THC.  Hx of ETOH, Bipolar, COPD.  Past Medical History  COPD with chronic tobacco abuse Bipolar Polysubstance abuse  Significant Hospital Events   12/16/2018 intubated 1/27 alcohol withdrawal seizure   Consults:    Procedures:  12/16/2018 intubation>> 1/25 Failed extubation, re-intubated   Significant Diagnostic Tests:  12/16/2018 CT head >> negative  Micro Data:  1/24 Resp Cx >> Klebsiella pneumoniae  1/24 Blood Cx >> NG 2 days 1/24 Urine Culture >> NG (final)  1/26 Resp Cx >> Strep Pneumoniae   Antimicrobials:  1/26 Ceftriaxone >> dc'd 1/27 1/27 Ancef >> dc'd 1/28 1/28 >> Ceftriaxone   Interim history/subjective:  Sedated this morning, doing well on pressure support  Objective   Blood pressure 105/71, pulse 97, temperature 99.1 F (37.3 C), temperature source Oral, resp. rate 20, height 5\' 6"  (1.676 m), weight 71.4 kg, SpO2 93 %.    Vent Mode: PRVC FiO2 (%):  [40 %] 40 % Set Rate:  [20 bmp] 20 bmp Vt Set:  [510 mL] 510 mL PEEP:  [5 cmH20] 5 cmH20 Pressure Support:  [10 cmH20] 10 cmH20 Plateau Pressure:  [15 cmH20-16 cmH20] 16 cmH20   Intake/Output Summary (Last 24 hours) at 12/21/2018 0733 Last data filed at 12/21/2018 0600 Gross per 24 hour  Intake 2425.67 ml  Output 1450 ml  Net 975.67 ml   Filed Weights   12/17/18 0200 12/20/18 0403 12/21/18 0419  Weight: 71.4 kg 71.4 kg 71.4 kg    Examination:  General - sedated Eyes - pupils reactive ENT - ETT in place Cardiac - regular rate/rhythm, no murmur Chest - equal breath sounds b/l, no wheezing or rales Abdomen -  soft, non tender, + bowel sounds Extremities - no cyanosis, clubbing, or edema Skin - no rashes Neuro - RASS -3  Resolved Hospital Problem list     Assessment & Plan:   Acute Hypoxic Respiratory Failure: Due to klebsiella & Strep pneumo pneumonia. Also has COPD with current tobacco use. Failed extubation 1/25. Plan for extubation once mental status improves. -- Continue ceftriaxone (day 2/7) -- Full vent support -- Daily SBT -- Scheduled duonebs q6h -- Albuterol prn   Acute Metabolic Encephalopathy: With alcohol withdrawal seizure on 1/27, UDS + for cocaine and THC. -- Taper scheduled ativan 0.5 mg q6h -> q12h today  -- CIWA with PRN ativan  -- Weaning Precedex as able  -- Clonidine taper per pharmacy  -- Thiamine, folic acid  Hx Diastolic HF Hx HTN -- Holding home metoprolol -- Increase IV lasix 40 mg daily   Hx of bipolar disease. -- Continue holding home depakote, zyprexa, prozac, and trazadone given mental status   Best practice:  Diet: Tube feeds DVT prophylaxis: Lovenox GI prophylaxis: Protonix Mobility: Bedrest Code Status: Full Family Communication: Updated daughter at bedside, 1/28  Reymundo Poll, M.D. - PGY3 Pager: [phone] 12/21/2018, 7:33 AM    Addendum:  S: Weaned on sedation this morning. Agitated and tachycardic.  O: Blood pressure 111/70, pulse (!) 108, temperature 100.2 F (37.9 C), temperature source Oral, resp. rate 20, height 5\' 6"  (1.676 m), weight 71.4 kg, SpO2 91 %. Vent Mode: PRVC  FiO2 (%):  [40 %] 40 % Set Rate:  [15 bmp-20 bmp] 20 bmp Vt Set:  [510 mL] 510 mL PEEP:  [5 cmH20] 5 cmH20 Plateau Pressure:  [14 cmH20-18 cmH20] 18 cmH20  Intake/Output from previous day:  Intake/Output Summary (Last 24 hours) at 12/21/2018 2134 Last data filed at 12/21/2018 2100 Gross per 24 hour  Intake 1881.26 ml  Output 1825 ml  Net 56.26 ml    On my exam: Agitated male laying in bed. Intubated. Lungs CTABL, no wheezing. Cardiac exam  tachycardic, no murmurs. No pedal edema, warm to touch.  Interim Data: Lab Results  Component Value Date   WBC 14.4 (H) 12/21/2018   HGB 11.9 (L) 12/21/2018   HCT 38.3 (L) 12/21/2018   MCV 92.3 12/21/2018   PLT 221 12/21/2018   Lab Results  Component Value Date   NA 147 (H) 12/21/2018   K 4.3 12/21/2018   CL 108 12/21/2018   CO2 30 12/21/2018   GLUCOSE 148 (H) 12/21/2018   BUN 35 (H) 12/21/2018   CREATININE 0.70 12/21/2018   CALCIUM 8.9 12/21/2018   RQ 1/24 Klebsiella pneum RQ 1/26 Strep pneum  Labs independently reviewed and interpreted by me.  Impression/Plan: Acute hypoxemic respiratory failure secondary to Klebsiella pn and Strep pn COPD Exacerbation - Full vent support - Continue Ancef D3 - Steroids x 5d - Scheduled nebs  Delirium Acute metabolic encephalopathy Hx EtOH abuse/withdrawal - EEG neg - Precedex gtt - Scheduled ativan - CIWA, thiamine, folate  Updated daughter at bedside on medical plan  Independent Critical Care Time devoted to patient care services described in this note is 40 minutes.   Mechele Collin, M.D. [facility] Pulmonary/Critical Care Medicine Pager: [phone] After hours pager: [phone]

## 2018-12-21 NOTE — Progress Notes (Signed)
RT student found pt in PCV PC 10, R 15, Peep 5, 40%, but MD order states for [facility] Rate 20, VT 510, Pepp 5, 40% so this RT placed pt back on settings per MD order

## 2018-12-22 ENCOUNTER — Inpatient Hospital Stay (HOSPITAL_COMMUNITY): Payer: Medicare Other

## 2018-12-22 LAB — BASIC METABOLIC PANEL
Anion gap: 8 (ref 5–15)
Anion gap: 12 (ref 5–15)
BUN: 37 mg/dL — ABNORMAL HIGH (ref 6–20)
BUN: 39 mg/dL — ABNORMAL HIGH (ref 6–20)
CO2: 32 mmol/L (ref 22–32)
CO2: 30 mmol/L (ref 22–32)
Calcium: 8.8 mg/dL — ABNORMAL LOW (ref 8.9–10.3)
Calcium: 8.8 mg/dL — ABNORMAL LOW (ref 8.9–10.3)
Chloride: 104 mmol/L (ref 98–111)
Chloride: 101 mmol/L (ref 98–111)
Creatinine, Ser: 0.71 mg/dL (ref 0.61–1.24)
Creatinine, Ser: 0.74 mg/dL (ref 0.61–1.24)
GFR calc Af Amer: >60 mL/min (ref >60)
GFR calc Af Amer: >60 mL/min (ref >60)
GFR calc non Af Amer: >60 mL/min (ref >60)
GFR calc non Af Amer: >60 mL/min (ref >60)
Glucose, Bld: 143 mg/dL — ABNORMAL HIGH (ref 70–99)
Glucose, Bld: 132 mg/dL — ABNORMAL HIGH (ref 70–99)
Potassium: 4.1 mmol/L (ref 3.5–5.1)
Potassium: 4.4 mmol/L (ref 3.5–5.1)
Sodium: 144 mmol/L (ref 135–145)
Sodium: 143 mmol/L (ref 135–145)

## 2018-12-22 LAB — GLUCOSE, CAPILLARY
Glucose-Capillary: 120 mg/dL — ABNORMAL HIGH (ref 70–99)
Glucose-Capillary: 122 mg/dL — ABNORMAL HIGH (ref 70–99)
Glucose-Capillary: 126 mg/dL — ABNORMAL HIGH (ref 70–99)
Glucose-Capillary: 133 mg/dL — ABNORMAL HIGH (ref 70–99)
Glucose-Capillary: 152 mg/dL — ABNORMAL HIGH (ref 70–99)
Glucose-Capillary: 183 mg/dL — ABNORMAL HIGH (ref 70–99)

## 2018-12-22 LAB — CBC
HCT: 41.6 % (ref 39.0–52.0)
Hemoglobin: 12.8 g/dL — ABNORMAL LOW (ref 13.0–17.0)
MCH: 28.8 pg (ref 26.0–34.0)
MCHC: 30.8 g/dL (ref 30.0–36.0)
MCV: 93.5 fL (ref 80.0–100.0)
Platelets: 269 10*3/uL (ref 150–400)
RBC: 4.45 MIL/uL (ref 4.22–5.81)
RDW: 13.4 % (ref 11.5–15.5)
WBC: 13.1 10*3/uL — ABNORMAL HIGH (ref 4.0–10.5)
nRBC: 0.5 % — ABNORMAL HIGH (ref 0.0–0.2)

## 2018-12-22 LAB — VITAMIN B12: Vitamin B-12: 476 pg/mL (ref 180–914)

## 2018-12-22 LAB — PHOSPHORUS: Phosphorus: 4.3 mg/dL (ref 2.5–4.6)

## 2018-12-22 LAB — TSH: TSH: 0.787 u[IU]/mL (ref 0.350–4.500)

## 2018-12-22 LAB — MAGNESIUM: Magnesium: 2 mg/dL (ref 1.7–2.4)

## 2018-12-22 MED ORDER — LORAZEPAM 2 MG/ML IJ SOLN: 0.5000 mg | Freq: Two times a day (BID) | INTRAMUSCULAR | Status: DC

## 2018-12-22 MED ORDER — SENNOSIDES-DOCUSATE SODIUM 8.6-50 MG PO TABS
1.0000 | ORAL_TABLET | Freq: Every day | ORAL | Status: DC
  Administered 2018-12-22 – 2018-12-29 (×6): 1
  Filled 2018-12-22 (×9): qty 1

## 2018-12-22 MED ORDER — VITAMIN B-1 100 MG PO TABS
500.0000 mg | ORAL_TABLET | Freq: Three times a day (TID) | ORAL | Status: DC
  Administered 2018-12-22 – 2018-12-23 (×3): 500 mg
  Filled 2018-12-22 (×3): qty 5

## 2018-12-22 MED ORDER — LORAZEPAM 2 MG/ML IJ SOLN
1.0000 mg | Freq: Four times a day (QID) | INTRAMUSCULAR | Status: DC
  Administered 2018-12-22 – 2018-12-29 (×25): 1 mg via INTRAVENOUS
  Filled 2018-12-22 (×26): qty 1

## 2018-12-22 MED ORDER — VITAMIN B-1 50 MG PO TABS: 250.0000 mg | ORAL_TABLET | Freq: Two times a day (BID) | ORAL | Status: DC

## 2018-12-22 MED ORDER — QUETIAPINE FUMARATE 50 MG PO TABS
50.0000 mg | ORAL_TABLET | Freq: Two times a day (BID) | ORAL | Status: DC
  Administered 2018-12-22 – 2018-12-30 (×12): 50 mg
  Filled 2018-12-22 (×20): qty 1

## 2018-12-22 MED ORDER — VITAMIN B-1 100 MG PO TABS
500.0000 mg | ORAL_TABLET | Freq: Three times a day (TID) | ORAL | Status: DC
  Filled 2018-12-22: qty 5

## 2018-12-22 MED ORDER — CLONIDINE HCL 0.3 MG PO TABS
0.3000 mg | ORAL_TABLET | Freq: Four times a day (QID) | ORAL | Status: DC
  Administered 2018-12-22 – 2018-12-26 (×13): 0.3 mg
  Filled 2018-12-22 (×22): qty 1

## 2018-12-22 NOTE — Consult Note (Addendum)
Neurology Consultation Reason for Consult: Possible seizure activity in a person with alcohol withdrawal Referring Physician: Loanne Drilling  CC: Questionable seizure  History is obtained from: Chart  HPI: Jeffrey [name] is a 58 y.o. male with history of [name], pneumonia, hypertension, hyperlipidemia, bipolar disease, the hall abuse.   Not much of history is obtained secondary to patient being inebriated on arrival and then needed to be intubated.   On arrival patient had an elevated ammonia level of 274.  UDS positive for cocaine, THC.  On 12/19/2018 patient did have one episode of generalized seizures that lasted 5 minutes.   Today the resident physician in the unit  was alerted by the nurse due to acute agitation, tachycardia and hypertension after returning from MRI.   On entering the room patient was noted to be posturing and rigid with upward and lateral eye deviation along with breathing over the ventilator.  Blood pressure was 956 systolic.  Remains tachycardic.  Similar episode apparently happened with the prior seizure and at that time he was given 4 mg of Ativan.   Patient was on scheduled benzos-1 mg every 6 hours this was reduced to 0.5 mg every 6 hours and he had missed 1 dose preceding to the seizure. He is currently on Precedex drip at maximum dose, bolus fentanyl and standing doses of Ativan.  No clinical seizure noted at the time of this encounter.  Currently his pulse is 107 however is been noted that when sedation is decreased his pulse goes up to the 150s.  ROS:  Unable to obtain due to altered mental status.   Past Medical History:  Diagnosis Date  . Acute blood loss anemia 02/07/2017  . Anxiety   . Arthritis    deg disease, bulging disk,  shoulder level  . Bipolar disorder ([place])   . Bipolar disorder ([place])   . 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 Depression    anxiety  . Hyperlipidemia   . Hypertension   . Peptic ulcer disease    Review  . Pneumonia   . [name] (peripheral  vascular disease) ([place]) 06/18/2015  . Shortness of breath      Family History  Problem Relation Age of Onset  . Breast cancer Mother        deceased  . Heart disease Father   . Depression Daughter   . Anxiety disorder Daughter   . Anxiety disorder Son   . Depression Son   . Asthma Brother   . Heart attack Maternal Aunt   . Heart attack Maternal Uncle   . Heart attack Paternal Aunt   . Heart attack Paternal Uncle   . Heart attack Maternal Grandmother   . Heart attack Maternal Grandfather   . Emphysema Maternal Grandfather   . Heart attack Paternal Grandmother   . Heart attack Paternal Grandfather   . Colon cancer Neg Hx   . Liver disease Neg Hx     Social History:   reports that he has been smoking cigarettes. He started smoking about 47 years ago. He has a 40.00 pack-year smoking history. He has never used smokeless tobacco. He reports current drug use. Drug: Marijuana. He reports that he does not drink alcohol.  Medications  Current Facility-Administered Medications:  .  0.9 %  sodium chloride infusion, , Intravenous, PRN, Margaretha Seeds, MD, Last Rate: 10 mL/hr at 12/22/18 0600 .  acetaminophen (TYLENOL) tablet 650 mg, 650 mg, Per Tube, Q6H PRN **OR** acetaminophen (TYLENOL) suppository 650 mg, 650 mg,  Rectal, Q6H PRN, Chesley Mires, MD .  albuterol (PROVENTIL) (2.5 MG/3ML) 0.083% nebulizer solution 2.5 mg, 2.5 mg, Nebulization, Q4H PRN, Opyd, Ilene Qua, MD, 2.5 mg at 12/18/18 0550 .  bisacodyl (DULCOLAX) suppository 10 mg, 10 mg, Rectal, Daily PRN, Maslonka, Matthew A, MD .  cefTRIAXone (ROCEPHIN) 2 g in sodium chloride 0.9 % 100 mL IVPB, 2 g, Intravenous, Q24H, Margaretha Seeds, MD, Last Rate: 200 mL/hr at 12/22/18 1513, 2 g at 12/22/18 1513 .  chlorhexidine gluconate (MEDLINE KIT) (PERIDEX) 0.12 % solution 15 mL, 15 mL, Mouth Rinse, BID, Sood, Vineet, MD, 15 mL at 12/22/18 0800 .  cloNIDine (CATAPRES) tablet 0.3 mg, 0.3 mg, Per Tube, Q6H, Margaretha Seeds, MD .   dexmedetomidine (PRECEDEX) 400 MCG/100ML (4 mcg/mL) infusion, 0.4-2 mcg/kg/hr, Intravenous, Continuous, Seawell, Jaimie A, DO, Last Rate: 21.4 mL/hr at 12/22/18 1512, 1.2 mcg/kg/hr at 12/22/18 1512 .  enoxaparin (LOVENOX) injection 40 mg, 40 mg, Subcutaneous, Q24H, Rush Farmer, MD, 40 mg at 12/22/18 0947 .  feeding supplement (VITAL AF 1.2 CAL) liquid 1,000 mL, 1,000 mL, Per Tube, Continuous, Velna Ochs, MD, Last Rate: 65 mL/hr at 12/21/18 1800 .  fentaNYL (SUBLIMAZE) bolus via infusion 50 mcg, 50 mcg, Intravenous, Q1H PRN, Minor, Grace Bushy, NP, 50 mcg at 12/22/18 1410 .  fentaNYL (SUBLIMAZE) injection 100 mcg, 100 mcg, Intravenous, Q2H PRN, Opyd, Ilene Qua, MD, 100 mcg at 12/20/18 2150 .  fentaNYL (SUBLIMAZE) injection 100 mcg, 100 mcg, Intravenous, Q2H PRN, Caffie Damme, MD, 50 mcg at 12/22/18 1400 .  fentaNYL 258mg in NS 2582m(1073mml) infusion-PREMIX, 0-400 mcg/hr, Intravenous, Continuous, Seawell, Jaimie A, DO, Last Rate: 17.5 mL/hr at 12/22/18 1400, 175 mcg/hr at 12/22/18 1400 .  folic acid (FOLVITE) tablet 1 mg, 1 mg, Per Tube, Daily, Sood, Vineet, MD, 1 mg at 12/22/18 0946 .  free water 200 mL, 200 mL, Per Tube, Q8H, Guilloud, CarHoyle SauerD, 200 mL at 12/22/18 1417 .  furosemide (LASIX) injection 40 mg, 40 mg, Intravenous, Daily, GuiVelna OchsD, 40 mg at 12/22/18 1028 .  insulin aspart (novoLOG) injection 0-9 Units, 0-9 Units, Subcutaneous, Q4H, SomAnders SimmondsD, 1 Units at 12/22/18 [id number] ipratropium-albuterol (DUONEB) 0.5-2.5 (3) MG/3ML nebulizer solution 3 mL, 3 mL, Nebulization, Q6H, MadBarton DuboisD, 3 mL at 12/22/18 1427 .  LORazepam (ATIVAN) injection 1 mg, 1 mg, Intravenous, Q6H, Guilloud, Carolyn, MD .  LORazepam (ATIVAN) injection 1-2 mg, 1-2 mg, Intravenous, Q1H PRN, SooChesley MiresD, 2 mg at 12/22/18 1415 .  LORazepam (ATIVAN) injection 1-4 mg, 1-4 mg, Intravenous, Q4H PRN, SooChesley MiresD, 4 mg at 12/20/18 0926 .  MEDLINE mouth rinse, 15 mL,  Mouth Rinse, 10 times per day, SooChesley MiresD, 15 mL at 12/22/18 1400 .  [DISCONTINUED] ondansetron (ZOFRAN) tablet 4 mg, 4 mg, Oral, Q6H PRN **OR** ondansetron (ZOFRAN) injection 4 mg, 4 mg, Intravenous, Q6H PRN, Opyd, Timothy S, MD .  pantoprazole sodium (PROTONIX) 40 mg/20 mL oral suspension 40 mg, 40 mg, Per Tube, Q24H, Sood, Vineet, MD, 40 mg at 12/22/18 1416 .  QUEtiapine (SEROQUEL) tablet 50 mg, 50 mg, Per Tube, BID, EllMargaretha SeedsD, 50 mg at 12/22/18 0958 .  senna-docusate (Senokot-S) tablet 1 tablet, 1 tablet, Per Tube, Daily, EllMargaretha SeedsD, 1 tablet at 12/22/18 [id number] sodium chloride flush (NS) 0.9 % injection 10-40 mL, 10-40 mL, Intracatheter, Q12H, Sood, Vineet, MD, 10 mL at 12/22/18 1002 .  sodium chloride flush (NS) 0.9 % injection 10-40 mL,  10-40 mL, Intracatheter, PRN, Chesley Mires, MD .  sodium chloride flush (NS) 0.9 % injection 3 mL, 3 mL, Intravenous, Q12H, Opyd, Ilene Qua, MD, 3 mL at 12/21/18 2154 .  thiamine (VITAMIN B-1) tablet 500 mg, 500 mg, Per Tube, TID **FOLLOWED BY** [START ON 12/25/2018] thiamine (VITAMIN B-1) tablet 250 mg, 250 mg, Per Tube, BID, Velna Ochs, MD   Exam: Current vital signs: BP (!) 82/60   Pulse (!) 108   Temp 99.1 F (37.3 C) (Axillary)   Resp 20   Ht 5' 6"  (1.676 m) Comment: measured at bedside  Wt 71.6 kg   SpO2 92%   BMI 25.48 kg/m  Vital signs in last 24 hours: Temp:  [98.6 F (37 C)-100.2 F (37.9 C)] 99.1 F (37.3 C) (01/30 1118) Pulse Rate:  [88-149] 108 (01/30 1500) Resp:  [19-48] 20 (01/30 1500) BP: (82-162)/(58-145) 82/60 (01/30 1500) SpO2:  [91 %-99 %] 92 % (01/30 1500) FiO2 (%):  [40 %-100 %] 40 % (01/30 1427) Weight:  [71.6 kg] 71.6 kg (01/30 0340)  Physical Exam  Constitutional: Appears well-developed and well-nourished.  Psych: Intubated Eyes: No scleral injection HENT: No OP obstrucion Head: Normocephalic.  Cardiovascular: Tachycardic Respiratory: Breathing with ventilator GI: Soft. .   Skin: WDI Neuro: Mental Status: Patient currently intubated and sedated significantly. Cranial Nerves: Pupils are 2 mm and reactive.  Eyes are mildly overtly deviated and mildly disconjugate.  Gag and cough intact strongly. Motor: Tone is normal however patient is not moving extremities secondary to sedation.  No movement appreciated on sternal rub and noxious stimulation to the traps as well as thigh. Sensory: No withdrawal from pain  Labs I have reviewed labs in epic and the results pertinent to this consultation are:   CBC    Component Value Date/Time   WBC 13.1 (H) 12/22/2018 0745   RBC 4.45 12/22/2018 0745   HGB 12.8 (L) 12/22/2018 0745   HCT 41.6 12/22/2018 0745   HCT 37.7 12/16/2018 0547   PLT 269 12/22/2018 0745   MCV 93.5 12/22/2018 0745   MCH 28.8 12/22/2018 0745   MCHC 30.8 12/22/2018 0745   RDW 13.4 12/22/2018 0745   LYMPHSABS 0.7 12/17/2018 0325   MONOABS 0.8 12/17/2018 0325   EOSABS 0.0 12/17/2018 0325   BASOSABS 0.0 12/17/2018 0325    CMP     Component Value Date/Time   NA 143 12/22/2018 0745   K 4.4 12/22/2018 0745   CL 101 12/22/2018 0745   CO2 30 12/22/2018 0745   GLUCOSE 132 (H) 12/22/2018 0745   BUN 39 (H) 12/22/2018 0745   CREATININE 0.74 12/22/2018 0745   CREATININE 1.12 05/02/2013 0940   CALCIUM 8.8 (L) 12/22/2018 0745   PROT 5.0 (L) 12/18/2018 0423   ALBUMIN 2.3 (L) 12/18/2018 0423   AST 28 12/18/2018 0423   ALT 14 12/18/2018 0423   ALKPHOS 55 12/18/2018 0423   BILITOT 0.7 12/18/2018 0423   GFRNONAA >60 12/22/2018 0745   GFRAA >60 12/22/2018 0745    Imaging I have reviewed the images obtained: CT-scan of the brain-CT head on 12/16/2018 shows no acute pathology MRI examination of the brain- MRI obtained today shows no acute abnormalities  Etta Quill PA-C [facility] [phone]  M-F  (9:00 am- 5:00 PM)  12/22/2018, 3:34 PM   Attending addendum Patient seen and examined Patient was brought in after mental status  with UDS positive for cocaine, THC and a history of alcohol abuse.  He continued to be very altered and agitated  requiring moderate amounts of sedation. He had a seizure on 12/19/2018 that required 4 mg of Ativan.  He had another episode concerning for seizure today when he turned himself to the side of the bed and then became stiff.  Unclear if there was any gaze deviation. Episode lasted about 10 to 15 minutes and required some Ativan to calm him down.  It is very unclear whether these events were seizures because at least with the episode today the nurse taking care of the patient was not fully convinced that there was any generalized clonic activity although he did seem to have kind of been posturing at the time. His exam is limited with sedation and I agree with exam documented above which I performed independently. MRI of the brain was done and I reviewed independently.  No acute changes. Last ammonia level is 47- 3 days ago  Echo reviewed- normal LVEF.  Pseudo-normal left ventricular filling pattern with grade 2 diastolic dysfunction.  Moderately thickened mitral valve leaflet.  Assessment:  58 year old male with what likely is continued alcohol and substance abuse withdrawal. I am not sure if his seizures were really withdrawal seizures or if they were truly seizures. I would attribute his current presentation to prolonged delirium tremens as well as encephalopathy due to other substances of abuse. I would recommend further work-up to evaluate for seizures as below.  Impression Toxic metabolic encephalopathy in the setting of polysubstance abuse and alcohol intoxication. Evaluate for seizures  Recommendations: -Continue sedation and attempt lightening as tolerated. -Continue to high-dose thiamine 500 mg 3 times daily for 3 days and then 100 mg daily - Scheduled Ativan 1 mg every 4 to 6 hours -Seizure precautions -Routine EEG -No antiepileptics for now. -Check TSH and  B12  -- Amie Portland, MD [facility] Pager: [phone] If 7pm to 7am, please call on call as listed on AMION.

## 2018-12-22 NOTE — Progress Notes (Signed)
Pt transported to and from 2M08 to MRI on vent.pt stable throughout with no complications. VS within normal limits.

## 2018-12-22 NOTE — Progress Notes (Addendum)
Nutrition Follow-up  DOCUMENTATION CODES:   Not applicable  INTERVENTION:    Resume TF when patient returns from MRI: Vital AF 1.2 at 65 ml/h to provide  1872 kcal, 117 gm protein, 1265 ml free water daily.  Monitor for abdominal distention, abdominal pain, and vomiting. If TF intolerance is an issue, can place a post-pyloric Cortrak tube (service available M-W-F-Sat)  NUTRITION DIAGNOSIS:   Inadequate oral intake related to inability to eat as evidenced by NPO status.  Ongoing  GOAL:   Patient will meet greater than or equal to 90% of their needs  Met with TF  MONITOR:   Vent status, TF tolerance, Labs, I & O's  ASSESSMENT:   58 yo male with PMH of [name], bipolar d/o, alcohol & marajuana abuse, [name], COPD, HTN who was admitted AMS, elevated ammonia level requiring intubation on admission.  Patient remains intubated on ventilator support MV: 10.8 L/min Temp (24hrs), Avg:99.3 F (37.4 C), Min:98.6 F (37 C), Max:100.2 F (37.9 C)   Receiving Vital High Protein at 60 ml/h via OGT, meeting 100% of estimated nutrition needs. RN concerned about tolerance because patient's abdomen is distended and hard.  TF currently on hold for MRI of head to evaluate for Wernicke's.   Labs reviewed.  CBG's: 133-122-120 Medications reviewed and include Lasix, Novolog, folic acid, free water 200 ml every 8 hours, thiamine, senokot-s.   Diet Order:   Diet Order            Diet NPO time specified  Diet effective now              EDUCATION NEEDS:   No education needs have been identified at this time  Skin:  Skin Assessment: Reviewed RN Assessment  Last BM:  1/30 (type 7) Rectal tube  Height:   Ht Readings from Last 1 Encounters:  12/18/18 5' 6"  (1.676 m)    Weight:   Wt Readings from Last 1 Encounters:  12/22/18 71.6 kg    Ideal Body Weight:  64.5 kg  BMI:  Body mass index is 25.48 kg/m.  Estimated Nutritional Needs:   Kcal:  5379  Protein:  100-115  gm  Fluid:  >/= 1.7 L    Molli Barrows, RD, LDN, [place] Pager [phone] After Hours Pager [phone]

## 2018-12-22 NOTE — Progress Notes (Addendum)
NAME:  Jeffrey [name], MRN:  [id number], DOB:  June 22, 1961, LOS: 6 ADMISSION DATE:  12/16/2018,  REFERRING MD: [facility] emergency department physician, CHIEF COMPLAINT: Altered mental status, elevated ammonia level  Brief History   58 yo male smoker with altered mental status, agitation.  Intubated for airway protection.  Elevated ammonia level (274).  UDS positive for cocaine, THC.  Hx of ETOH, Bipolar, COPD.  Past Medical History  COPD with chronic tobacco abuse Bipolar Polysubstance abuse  Significant Hospital Events   12/16/2018 intubated 1/27 alcohol withdrawal seizure   Consults:    Procedures:  12/16/2018 intubation>> 1/25 Failed extubation, re-intubated   Significant Diagnostic Tests:  12/16/2018 CT head >> negative  Micro Data:  1/24 Resp Cx >> Klebsiella pneumoniae  1/24 Blood Cx >> NG 2 days 1/24 Urine Culture >> NG (final)  1/26 Resp Cx >> Strep Pneumoniae   Antimicrobials:  1/26 Ceftriaxone >> dc'd 1/27 1/27 Ancef >> dc'd 1/28 1/28 >> Ceftriaxone   Interim history/subjective:  Sedated on pressure support this morning. Appears flushed, diaphoretic.   Objective   Blood pressure 93/62, pulse 94, temperature 98.8 F (37.1 C), temperature source Oral, resp. rate 20, height 5\' 6"  (1.676 m), weight 71.6 kg, SpO2 93 %.    Vent Mode: PRVC FiO2 (%):  [40 %] 40 % Set Rate:  [15 bmp-20 bmp] 20 bmp Vt Set:  [510 mL] 510 mL PEEP:  [5 cmH20] 5 cmH20 Plateau Pressure:  [14 cmH20-21 cmH20] 21 cmH20   Intake/Output Summary (Last 24 hours) at 12/22/2018 0711 Last data filed at 12/22/2018 0600 Gross per 24 hour  Intake 1910.16 ml  Output 1925 ml  Net -14.84 ml   Filed Weights   12/20/18 0403 12/21/18 0419 12/22/18 0340  Weight: 71.4 kg 71.4 kg 71.6 kg    Examination:  General - sedated, flushed, diaphoretic  Eyes - pupils reactive ENT - ETT in place Cardiac - regular rate/rhythm, no murmur Chest - equal breath sounds b/l, expiratory wheezing on  left Abdomen - soft, non tender, + bowel sounds Extremities - no cyanosis, clubbing, or edema Skin - no rashes Neuro - RASS -3  Resolved Hospital Problem list     Assessment & Plan:   Acute Hypoxic Respiratory Failure: Due to klebsiella & Strep pneumo pneumonia. Also has COPD exac with current tobacco use. Failed extubation 1/25. Does well whenplaced on pressure support but requiring sedation for encephalopathy / withdrawal. Plan for extubation once mental status improves. -- Continue ceftriaxone (day 3/7) -- Prednisone 40 (day 4/5) -- Full vent support -- Daily SBT -- Scheduled duonebs q6h -- Albuterol prn   Acute Metabolic Encephalopathy: With alcohol withdrawal seizure on 1/27, UDS + for cocaine and THC. Long standing history of alcohol abuse. Becames very hypertensive, tachycardic, and tachypneic when sedation is weaned.  -- MRI to rule out wernicke's  -- Continue fentanyl gtt for RASS goal 0 to -1 -- Wean precedex as able  -- Clonidine taper per pharmacy  -- Decrease scheduled ativan 0.5 mg q6h -> q12h (stop tomorrow)  -- Continue CIWA with prn ativan -- Increase seroquel 50 mg qhs -> BID -- Thiamine, folic acid  Mild Hypernatremia: Resolved -- Continue free water per tube   Hx Diastolic HF Hx HTN -- Holding home metoprolol -- Continue IV lasix 40 mg daily   Hx of bipolar disease. -- Continue holding home depakote, zyprexa, prozac, and trazadone given mental status   Best practice:  Diet: Tube feeds DVT prophylaxis: Lovenox GI  prophylaxis: Protonix Mobility: Bedrest Code Status: Full Family Communication: Updated daughter at bedside, 1/30  Reymundo Poll, M.D. - PGY3 Pager: [phone] 12/22/2018, 7:11 AM

## 2018-12-22 NOTE — Progress Notes (Signed)
Alerted by nursing that patient was acutely agitated, tachycardic and hypertensive shortly after returning from MRI. On entering room patient was postured and rigid with upward and lateral eye deviation and breathing over the vent. Blood pressure was 162 systolic and tachycardic to 160s. Similar episode of likely alcohol withdrawal seizure yesterday for which patient was given 4 mg ativan. He had already been given 2mg  ativan with precedex 1.12mcg gtt and fentanyl gtt active. Prescribed extra 2mg  dose of ativan and patient's seizure like activity stopped within one minute with resolution of tachycardia and hypertension as well. His entire episode lasted about 7 minutes.   Versie Starks, DO 12/22/2018, 2:45 PM Pager: [phone]

## 2018-12-23 ENCOUNTER — Inpatient Hospital Stay (HOSPITAL_COMMUNITY): Payer: Medicare Other

## 2018-12-23 DIAGNOSIS — R451 Restlessness and agitation: Secondary | ICD-10-CM

## 2018-12-23 LAB — BASIC METABOLIC PANEL
Anion gap: 8 (ref 5–15)
BUN: 38 mg/dL — ABNORMAL HIGH (ref 6–20)
CO2: 33 mmol/L — ABNORMAL HIGH (ref 22–32)
Calcium: 8.6 mg/dL — ABNORMAL LOW (ref 8.9–10.3)
Chloride: 104 mmol/L (ref 98–111)
Creatinine, Ser: 0.8 mg/dL (ref 0.61–1.24)
GFR calc Af Amer: >60 mL/min (ref >60)
GFR calc non Af Amer: >60 mL/min (ref >60)
Glucose, Bld: 127 mg/dL — ABNORMAL HIGH (ref 70–99)
Potassium: 4.3 mmol/L (ref 3.5–5.1)
Sodium: 145 mmol/L (ref 135–145)

## 2018-12-23 LAB — CBC
HCT: 37.6 % — ABNORMAL LOW (ref 39.0–52.0)
Hemoglobin: 12 g/dL — ABNORMAL LOW (ref 13.0–17.0)
MCH: 29.5 pg (ref 26.0–34.0)
MCHC: 31.9 g/dL (ref 30.0–36.0)
MCV: 92.4 fL (ref 80.0–100.0)
Platelets: 269 10*3/uL (ref 150–400)
RBC: 4.07 MIL/uL — ABNORMAL LOW (ref 4.22–5.81)
RDW: 13.2 % (ref 11.5–15.5)
WBC: 13.2 10*3/uL — ABNORMAL HIGH (ref 4.0–10.5)
nRBC: 0.4 % — ABNORMAL HIGH (ref 0.0–0.2)

## 2018-12-23 LAB — GLUCOSE, CAPILLARY
Glucose-Capillary: 118 mg/dL — ABNORMAL HIGH (ref 70–99)
Glucose-Capillary: 124 mg/dL — ABNORMAL HIGH (ref 70–99)
Glucose-Capillary: 125 mg/dL — ABNORMAL HIGH (ref 70–99)
Glucose-Capillary: 130 mg/dL — ABNORMAL HIGH (ref 70–99)
Glucose-Capillary: 135 mg/dL — ABNORMAL HIGH (ref 70–99)
Glucose-Capillary: 142 mg/dL — ABNORMAL HIGH (ref 70–99)

## 2018-12-23 MED ORDER — POLYETHYLENE GLYCOL 3350 17 G PO PACK
17.0000 g | PACK | Freq: Every day | ORAL | Status: DC
  Administered 2018-12-23 – 2018-12-28 (×4): 17 g
  Filled 2018-12-23 (×7): qty 1

## 2018-12-23 MED ORDER — VITAMIN B-1 100 MG PO TABS
100.0000 mg | ORAL_TABLET | Freq: Two times a day (BID) | ORAL | Status: DC
  Administered 2018-12-25: 100 mg
  Filled 2018-12-23 (×4): qty 1

## 2018-12-23 MED ORDER — VITAMIN B-1 100 MG PO TABS
500.0000 mg | ORAL_TABLET | Freq: Three times a day (TID) | ORAL | Status: AC
  Administered 2018-12-23 – 2018-12-25 (×6): 500 mg
  Filled 2018-12-23 (×7): qty 5

## 2018-12-23 NOTE — Progress Notes (Signed)
NEURO HOSPITALIST PROGRESS NOTE   Subjective: No new events overnight. On precedex.   Exam: Vitals:   12/23/18 0900 12/23/18 1000  BP: 94/61 98/65  Pulse: 98 97  Resp: 18 20  Temp:    SpO2: 93% 93%    Physical Exam  Neurological exam Patient is sedated and intubated. His pupils are equal round reactive to light and gaze is mildly disconjugate. He does not blink to threat Facial symmetry difficult to ascertain Corneal reflexes present. Cough and gag present No spontaneous movement noted on sedation but none noxious stimulation he does actively move all 4 of his extremities pretty vigorously.  HEENT: Normocephalic atraumatic Cardiovascular S1-S2 heard pulses palpable Lungs: Scattered rales. Extremities: No edema.    Medications:  Scheduled: . chlorhexidine gluconate (MEDLINE KIT)  15 mL Mouth Rinse BID  . cloNIDine  0.3 mg Per Tube Q6H  . enoxaparin (LOVENOX) injection  40 mg Subcutaneous Q24H  . folic acid  1 mg Per Tube Daily  . free water  200 mL Per Tube Q8H  . furosemide  40 mg Intravenous Daily  . insulin aspart  0-9 Units Subcutaneous Q4H  . ipratropium-albuterol  3 mL Nebulization Q6H  . LORazepam  1 mg Intravenous Q6H  . mouth rinse  15 mL Mouth Rinse 10 times per day  . pantoprazole sodium  40 mg Per Tube Q24H  . polyethylene glycol  17 g Per Tube Daily  . QUEtiapine  50 mg Per Tube BID  . senna-docusate  1 tablet Per Tube Daily  . sodium chloride flush  10-40 mL Intracatheter Q12H  . sodium chloride flush  3 mL Intravenous Q12H  . thiamine  500 mg Per Tube TID   Followed by  . [START ON 12/25/2018] thiamine  250 mg Per Tube BID   Continuous: . sodium chloride 10 mL/hr at 12/23/18 1000  . cefTRIAXone (ROCEPHIN)  IV Stopped (12/22/18 1519)  . dexmedetomidine (PRECEDEX) IV infusion 1.2 mcg/kg/hr (12/23/18 1037)  . feeding supplement (VITAL AF 1.2 CAL) 1,000 mL (12/22/18 2220)  . fentaNYL infusion INTRAVENOUS 100 mcg/hr (12/23/18  1038)   QVZ:DGLOVF chloride, acetaminophen **OR** acetaminophen, albuterol, bisacodyl, fentaNYL, fentaNYL (SUBLIMAZE) injection, fentaNYL (SUBLIMAZE) injection, LORazepam, LORazepam, [DISCONTINUED] ondansetron **OR** ondansetron (ZOFRAN) IV, sodium chloride flush  Pertinent Labs/Diagnostics:  TSH and B12: WNL  Mr Brain Wo Contrast  Result Date: 12/22/2018 CLINICAL DATA:  Encephalopathy.  Altered level of consciousness. EXAM: MRI HEAD WITHOUT CONTRAST TECHNIQUE: Multiplanar, multiecho pulse sequences of the brain and surrounding structures were obtained without intravenous contrast. COMPARISON:  CT head 12/16/2018 FINDINGS: Brain: Ventricle size and cerebral volume normal. Negative for acute infarct. Negative for hemorrhage or mass. No midline shift. Vascular: Normal arterial flow voids Skull and upper cervical spine: Negative Sinuses/Orbits: Mild mucosal edema paranasal sinuses. Normal orbit. Patient is intubated. Other: None IMPRESSION: No acute abnormality. Electronically Signed   By: Franchot Gallo M.D.   On: 12/22/2018 14:07   Assessment:  58 year old male with what likely is continued alcohol and substance abuse withdrawal. I am not sure if his seizures were really withdrawal seizures or if they were truly seizures. I would attribute his current presentation to prolonged delirium tremens as well as encephalopathy due to other substances of abuse. I would recommend further work-up to evaluate for seizures as below. MRI 12/22/2018: no acute abnormality.  Impression Toxic metabolic encephalopathy in the setting of polysubstance  abuse and alcohol intoxication. Evaluate for seizures  Recommendations: -Continue sedation and attempt lightening as tolerated. -Continue to high-dose thiamine 500 mg 3 times daily for 3 days and then 100 mg daily - Scheduled Ativan 1 mg every 4 to 6 hours -Seizure precautions -Routine EEG (ordered) -No antiepileptics for now. -We will continue to  follow.  Laurey Morale, MSN, NP-C [facility] [phone]   Attending neurologist's note to follow Patient seen and examined with Laurey Morale, NP Agree with history physical documented above I have made the edits in the note as needed.  -- Amie Portland, MD [facility] Pager: [phone] If 7pm to 7am, please call on call as listed on AMION.    12/23/2018, 10:55 AM

## 2018-12-23 NOTE — Progress Notes (Signed)
EEG complete - results pending 

## 2018-12-23 NOTE — Care Management Note (Signed)
Case Management Note  Patient Details  Name: Jeffrey [name] MRN: [id number] Date of Birth: Dec 21, 1960  Subjective/Objective:     Pt admitted with respiratory failure secondary to PNA               Action/Plan:   From home.     Expected Discharge Date:                  Expected Discharge Plan:     In-House Referral:  Clinical Social Work  Discharge planning Services  CM Consult  Post Acute Care Choice:    Choice offered to:     DME Arranged:    DME Agency:     HH Arranged:    HH Agency:     Status of Service:  In process, will continue to follow  If discussed at Long Length of Stay Meetings, dates discussed:    Additional Comments: 12/23/2018 Remains on vent and now with ileus Cherylann Parr, RN 12/23/2018, 4:09 PM

## 2018-12-23 NOTE — Procedures (Signed)
ELECTROENCEPHALOGRAM REPORT   Patient: Jeffrey [name]       Room #: 5V74M EEG No. ID: [id number] Age: 58 y.o.        Sex: male Referring Physician: Everardo All Report Date:  12/23/2018        Interpreting Physician: Thana Farr  History: Jeffrey [name] is an 58 y.o. male presenting with seizure  Medications:  Rocephin, Catapres, Folic acid, Lasix, Insulin, Protonix, Miralax, Thiamine, Seroquel, Precedex, Fentanyl  Conditions of Recording:  This is a 21 channel routine scalp EEG performed with bipolar and monopolar montages arranged in accordance to the international 10/20 system of electrode placement. One channel was dedicated to EKG recording.  The patient is in the intubated and sedated state.  Description:  The background activity is low voltage and poorly organized.  It consists of a polymorphic delta activity that is continuous and diffusely distributed.  There are short runs of superimposed beta activity that are synchronous over both hemispheres and resemble sleep spindles.   No epileptiform activity is noted.   The patient is not stimulated during the recording. Hyperventilation and intermittent photic stimulation were not performed.  IMPRESSION: This is an abnormal EEG secondary to general background slowing.  This finding may be seen with a diffuse disturbance that is etiologically nonspecific, but may include a metabolic encephalopathy or medication effect, among other possibilities.  No epileptiform activity was noted.     Thana Farr, MD Neurology [phone] 12/23/2018, 4:53 PM

## 2018-12-23 NOTE — Progress Notes (Signed)
NAME:  Jeffrey [name], MRN:  [id number], DOB:  1960-12-22, LOS: 7 ADMISSION DATE:  12/16/2018,  REFERRING MD: [facility] emergency department physician, CHIEF COMPLAINT: Altered mental status, elevated ammonia level  Brief History   58 yo male smoker with altered mental status, agitation.  Intubated for airway protection.  Elevated ammonia level (274).  UDS positive for cocaine, THC.  Hx of ETOH, Bipolar, COPD.  Past Medical History  COPD with chronic tobacco abuse Bipolar Polysubstance abuse  Significant Hospital Events   12/16/2018 intubated 1/27 alcohol withdrawal seizure   Consults:    Procedures:  12/16/2018 intubation>> 1/25 Failed extubation, re-intubated   Significant Diagnostic Tests:  12/16/2018 CT head >> negative  Micro Data:  1/24 Resp Cx >> Klebsiella pneumoniae  1/24 Blood Cx >> NG 2 days 1/24 Urine Culture >> NG (final)  1/26 Resp Cx >> Strep Pneumoniae   Antimicrobials:  1/26 Ceftriaxone >> dc'd 1/27 1/27 Ancef >> dc'd 1/28 1/28 >> Ceftriaxone   Interim history/subjective:  Sedated on vent. No events overnight.   Objective   Blood pressure 94/66, pulse 89, temperature 98 F (36.7 C), temperature source Oral, resp. rate 20, height 5\' 6"  (1.676 m), weight 72.1 kg, SpO2 97 %.    Vent Mode: PRVC FiO2 (%):  [40 %-100 %] 40 % Set Rate:  [20 bmp] 20 bmp Vt Set:  [510 mL] 510 mL PEEP:  [5 cmH20] 5 cmH20 Plateau Pressure:  [18 cmH20-20 cmH20] 20 cmH20   Intake/Output Summary (Last 24 hours) at 12/23/2018 0657 Last data filed at 12/23/2018 [id number] Gross per 24 hour  Intake 3286.23 ml  Output 1270 ml  Net 2016.23 ml   Filed Weights   12/21/18 0419 12/22/18 0340 12/23/18 0330  Weight: 71.4 kg 71.6 kg 72.1 kg    Examination:  General - sedated, flushed, diaphoretic  Eyes - pupils reactive ENT - ETT in place Cardiac - regular rate/rhythm, no murmur Chest - equal breath sounds b/l, expiratory wheezing on left Abdomen - soft, non tender, +  bowel sounds Extremities - no cyanosis, clubbing, or edema Skin - no rashes Neuro - RASS -3  Resolved Hospital Problem list     Assessment & Plan:   Acute Hypoxic Respiratory Failure: Due to klebsiella & Strep pneumo pneumonia. Also has COPD exac with current tobacco use. Failed extubation 1/25. Does well whenplaced on pressure support but requiring sedation for encephalopathy / withdrawal. Plan for extubation once mental status improves. -- Continue ceftriaxone (day 4/7) -- Prednisone 40 (day 5/5) -- Full vent support -- Daily SBT -- Scheduled duonebs q6h -- Albuterol prn   Acute Metabolic Encephalopathy Hx Alcohol Use Disorder Polysubstance abuse (UDD + cocaine, THC) Experienced alcohol withdrawal seizure on 1/27 and second seizure like episode yesterday 1/30. MRI brain yesterday unremarkable. -- Ativan increased back to 1 mg q6h yesterday after seizure like activity  -- Continue fentanyl gtt for RASS goal 0 to -1 -- Wean precedex as able  -- Clonidine taper per pharmacy  -- Continue CIWA with prn ativan -- Seroquel 50 mg BID -- Thiamine 300 mg TID x 3 days then 100 mg daily -- Folic acid  Mild Hypernatremia: Resolved -- Continue free water per tube   Hx Diastolic HF Hx HTN -- Holding home metoprolol -- Continue IV lasix 40 mg daily   Hx of bipolar disease. -- Continue holding home depakote, zyprexa, prozac, and trazadone given mental status   Best practice:  Diet: Tube feeds DVT prophylaxis: Lovenox GI prophylaxis: Protonix  Mobility: Bedrest Code Status: Full Family Communication: Updated daughter at bedside, 1/30  Jeffrey [name], M.D. - PGY3 Pager: [phone] 12/23/2018, 6:57 AM

## 2018-12-24 DIAGNOSIS — F1020 Alcohol dependence, uncomplicated: Secondary | ICD-10-CM

## 2018-12-24 LAB — GLUCOSE, CAPILLARY
Glucose-Capillary: 103 mg/dL — ABNORMAL HIGH (ref 70–99)
Glucose-Capillary: 104 mg/dL — ABNORMAL HIGH (ref 70–99)
Glucose-Capillary: 105 mg/dL — ABNORMAL HIGH (ref 70–99)
Glucose-Capillary: 114 mg/dL — ABNORMAL HIGH (ref 70–99)
Glucose-Capillary: 117 mg/dL — ABNORMAL HIGH (ref 70–99)
Glucose-Capillary: 126 mg/dL — ABNORMAL HIGH (ref 70–99)

## 2018-12-24 LAB — BASIC METABOLIC PANEL
Anion gap: 11 (ref 5–15)
BUN: 40 mg/dL — ABNORMAL HIGH (ref 6–20)
CO2: 33 mmol/L — ABNORMAL HIGH (ref 22–32)
Calcium: 8.7 mg/dL — ABNORMAL LOW (ref 8.9–10.3)
Chloride: 99 mmol/L (ref 98–111)
Creatinine, Ser: 0.95 mg/dL (ref 0.61–1.24)
GFR calc Af Amer: >60 mL/min (ref >60)
GFR calc non Af Amer: >60 mL/min (ref >60)
Glucose, Bld: 111 mg/dL — ABNORMAL HIGH (ref 70–99)
Potassium: 4.1 mmol/L (ref 3.5–5.1)
Sodium: 143 mmol/L (ref 135–145)

## 2018-12-24 LAB — CBC
HCT: 33.7 % — ABNORMAL LOW (ref 39.0–52.0)
Hemoglobin: 10.7 g/dL — ABNORMAL LOW (ref 13.0–17.0)
MCH: 30 pg (ref 26.0–34.0)
MCHC: 31.8 g/dL (ref 30.0–36.0)
MCV: 94.4 fL (ref 80.0–100.0)
Platelets: 274 10*3/uL (ref 150–400)
RBC: 3.57 MIL/uL — ABNORMAL LOW (ref 4.22–5.81)
RDW: 13.2 % (ref 11.5–15.5)
WBC: 11.3 10*3/uL — ABNORMAL HIGH (ref 4.0–10.5)
nRBC: 0 % (ref 0.0–0.2)

## 2018-12-24 LAB — MAGNESIUM: Magnesium: 2.1 mg/dL (ref 1.7–2.4)

## 2018-12-24 LAB — PHOSPHORUS: Phosphorus: 4.1 mg/dL (ref 2.5–4.6)

## 2018-12-24 NOTE — Progress Notes (Signed)
Neurology Progress Note   S:// Starting to wake up some with lowered sedation. Intermittently would follow some commands.   O:// Current vital signs: BP (!) 91/55   Pulse 92   Temp (!) 100.5 F (38.1 C) (Axillary)   Resp 20   Ht 5' 6"  (1.676 m) Comment: measured at bedside  Wt 72 kg   SpO2 95%   BMI 25.62 kg/m  Vital signs in last 24 hours: Temp:  [97.9 F (36.6 C)-100.5 F (38.1 C)] 100.5 F (38.1 C) (02/01 0724) Pulse Rate:  [88-103] 92 (02/01 0630) Resp:  [13-22] 20 (02/01 0630) BP: (84-114)/(52-77) 91/55 (02/01 0630) SpO2:  [91 %-96 %] 95 % (02/01 0630) FiO2 (%):  [30 %-40 %] 40 % (02/01 0430) Weight:  [72 kg] 72 kg (02/01 0500) General: Intubated sedated with Precedex and fentanyl HEENT: Normocephalic atraumatic CVS: Z6-X0 heard regular rate rhythm Respiratory: Vented, clear to auscultation Abdomen: Nondistended nontender Neurological exam Patient is sedated intubated Opens eyes to voice and more subtle noxious stimulation. Pupils are equal round reactive to light. Gaze remains mildly disconjugate with right exophoria and mildly upward deviation of both eyes. Does not blink to threat from either side No spontaneous movements On noxious stimulation, grimaces and opens eyes. On noxious donation to each extremity attempts some withdrawal in all 4.   Pupils areMedications  Current Facility-Administered Medications:  .  0.9 %  sodium chloride infusion, , Intravenous, PRN, Margaretha Seeds, MD, Last Rate: 10 mL/hr at 12/24/18 0600 .  acetaminophen (TYLENOL) tablet 650 mg, 650 mg, Per Tube, Q6H PRN **OR** acetaminophen (TYLENOL) suppository 650 mg, 650 mg, Rectal, Q6H PRN, Halford Chessman, Vineet, MD .  albuterol (PROVENTIL) (2.5 MG/3ML) 0.083% nebulizer solution 2.5 mg, 2.5 mg, Nebulization, Q4H PRN, Opyd, Ilene Qua, MD, 2.5 mg at 12/18/18 0550 .  bisacodyl (DULCOLAX) suppository 10 mg, 10 mg, Rectal, Daily PRN, Maslonka, Matthew A, MD .  cefTRIAXone (ROCEPHIN) 2 g in  sodium chloride 0.9 % 100 mL IVPB, 2 g, Intravenous, Q24H, Margaretha Seeds, MD, Stopped at 12/23/18 1353 .  chlorhexidine gluconate (MEDLINE KIT) (PERIDEX) 0.12 % solution 15 mL, 15 mL, Mouth Rinse, BID, Sood, Vineet, MD, 15 mL at 12/24/18 0750 .  cloNIDine (CATAPRES) tablet 0.3 mg, 0.3 mg, Per Tube, Q6H, Margaretha Seeds, MD, 0.3 mg at 12/24/18 0502 .  dexmedetomidine (PRECEDEX) 400 MCG/100ML (4 mcg/mL) infusion, 0.4-2 mcg/kg/hr, Intravenous, Continuous, Seawell, Jaimie A, DO, Last Rate: 25 mL/hr at 12/24/18 0600, 1.4 mcg/kg/hr at 12/24/18 0600 .  enoxaparin (LOVENOX) injection 40 mg, 40 mg, Subcutaneous, Q24H, Rush Farmer, MD, 40 mg at 12/24/18 0750 .  fentaNYL (SUBLIMAZE) bolus via infusion 50 mcg, 50 mcg, Intravenous, Q1H PRN, Minor, Grace Bushy, NP, 50 mcg at 12/22/18 1410 .  fentaNYL (SUBLIMAZE) injection 100 mcg, 100 mcg, Intravenous, Q2H PRN, Opyd, Ilene Qua, MD, 100 mcg at 12/24/18 0420 .  fentaNYL (SUBLIMAZE) injection 100 mcg, 100 mcg, Intravenous, Q2H PRN, Caffie Damme, MD, 50 mcg at 12/22/18 1400 .  fentaNYL 2558mg in NS 2560m(1029mml) infusion-PREMIX, 0-400 mcg/hr, Intravenous, Continuous, Seawell, Jaimie A, DO, Last Rate: 10 mL/hr at 12/24/18 0632, 100 mcg/hr at 12/24/18 [id number] folic acid (FOLVITE) tablet 1 mg, 1 mg, Per Tube, Daily, SooChesley MiresD, 1 mg at 12/23/18 0905 .  free water 200 mL, 200 mL, Per Tube, Q8H, GuiVelna OchsD, 200 mL at 12/23/18 1324 .  furosemide (LASIX) injection 40 mg, 40 mg, Intravenous, Daily, GuiVelna OchsD, 40 mg at 12/23/18 0909 .  insulin aspart (novoLOG) injection 0-9 Units, 0-9 Units, Subcutaneous, Q4H, Anders Simmonds, MD, 1 Units at 12/23/18 2339 .  ipratropium-albuterol (DUONEB) 0.5-2.5 (3) MG/3ML nebulizer solution 3 mL, 3 mL, Nebulization, Q6H, Barton Dubois, MD, 3 mL at 12/24/18 0306 .  LORazepam (ATIVAN) injection 1 mg, 1 mg, Intravenous, Q6H, Velna Ochs, MD, 1 mg at 12/24/18 0230 .  LORazepam (ATIVAN)  injection 1-2 mg, 1-2 mg, Intravenous, Q1H PRN, Chesley Mires, MD, 2 mg at 12/23/18 1623 .  LORazepam (ATIVAN) injection 1-4 mg, 1-4 mg, Intravenous, Q4H PRN, Chesley Mires, MD, 4 mg at 12/20/18 0926 .  MEDLINE mouth rinse, 15 mL, Mouth Rinse, 10 times per day, Chesley Mires, MD, 15 mL at 12/24/18 0501 .  [DISCONTINUED] ondansetron (ZOFRAN) tablet 4 mg, 4 mg, Oral, Q6H PRN **OR** ondansetron (ZOFRAN) injection 4 mg, 4 mg, Intravenous, Q6H PRN, Opyd, Ilene Qua, MD, 4 mg at 12/23/18 1342 .  pantoprazole sodium (PROTONIX) 40 mg/20 mL oral suspension 40 mg, 40 mg, Per Tube, Q24H, Sood, Vineet, MD, 40 mg at 12/23/18 1323 .  polyethylene glycol (MIRALAX / GLYCOLAX) packet 17 g, 17 g, Per Tube, Daily, Velna Ochs, MD, 17 g at 12/23/18 0906 .  QUEtiapine (SEROQUEL) tablet 50 mg, 50 mg, Per Tube, BID, Margaretha Seeds, MD, 50 mg at 12/23/18 2207 .  senna-docusate (Senokot-S) tablet 1 tablet, 1 tablet, Per Tube, Daily, Margaretha Seeds, MD, 1 tablet at 12/23/18 [phone]  sodium chloride flush (NS) 0.9 % injection 10-40 mL, 10-40 mL, Intracatheter, Q12H, Chesley Mires, MD, 10 mL at 12/23/18 2218 .  sodium chloride flush (NS) 0.9 % injection 10-40 mL, 10-40 mL, Intracatheter, PRN, Halford Chessman, Vineet, MD .  sodium chloride flush (NS) 0.9 % injection 3 mL, 3 mL, Intravenous, Q12H, Opyd, Ilene Qua, MD, 3 mL at 12/22/18 2213 .  thiamine (VITAMIN B-1) tablet 500 mg, 500 mg, Per Tube, TID, 500 mg at 12/23/18 2208 **FOLLOWED BY** [START ON 12/25/2018] thiamine (VITAMIN B-1) tablet 100 mg, 100 mg, Per Tube, BID, Velna Ochs, MD Labs CBC    Component Value Date/Time   WBC 11.3 (H) 12/24/2018 0334   RBC 3.57 (L) 12/24/2018 0334   HGB 10.7 (L) 12/24/2018 0334   HCT 33.7 (L) 12/24/2018 0334   HCT 37.7 12/16/2018 0547   PLT 274 12/24/2018 0334   MCV 94.4 12/24/2018 0334   MCH 30.0 12/24/2018 0334   MCHC 31.8 12/24/2018 0334   RDW 13.2 12/24/2018 0334   LYMPHSABS 0.7 12/17/2018 0325   MONOABS 0.8 12/17/2018 0325    EOSABS 0.0 12/17/2018 0325   BASOSABS 0.0 12/17/2018 0325    CMP     Component Value Date/Time   NA 143 12/24/2018 0334   K 4.1 12/24/2018 0334   CL 99 12/24/2018 0334   CO2 33 (H) 12/24/2018 0334   GLUCOSE 111 (H) 12/24/2018 0334   BUN 40 (H) 12/24/2018 0334   CREATININE 0.95 12/24/2018 0334   CREATININE 1.12 05/02/2013 0940   CALCIUM 8.7 (L) 12/24/2018 0334   PROT 5.0 (L) 12/18/2018 0423   ALBUMIN 2.3 (L) 12/18/2018 0423   AST 28 12/18/2018 0423   ALT 14 12/18/2018 0423   ALKPHOS 55 12/18/2018 0423   BILITOT 0.7 12/18/2018 0423   GFRNONAA >60 12/24/2018 0334   GFRAA >60 12/24/2018 0334   Imaging I have reviewed images in epic and the results pertinent to this consultation are: MRI of the brain done on 12/22/2018 shows no acute abnormalities.  Assessment: 58 year old man with ongoing encephalopathy likely  secondary to alcohol and substance abuse withdrawal.  Unclear description of seizures but could be withdrawal. Current presentation could be attributed to prolonged delirium tremens Repeat EEG yesterday showed generalized slowing and no focality.  Impression: Toxic metabolic encephalopathy-?  Delirium tremens Resolved status epilepticus/seizures  Recommendations: Continue to lighten sedation as tolerated Continue high-dose thiamine 500 mg 3 times daily for 3 days Continue scheduled benzos for withdrawal Continue seizure precautions Do not see a need for antiepileptics for now I would expect the mental status to improve with time. No further recommendations from a neurological standpoint at this time.  Please call neurology with questions.  -- Amie Portland, MD [facility] Pager: [phone] If 7pm to 7am, please call on call as listed on AMION.  CRITICAL CARE ATTESTATION Performed by: Amie Portland, MD Total critical care time: 25 minutes Critical care time was exclusive of separately billable procedures and treating other patients and/or supervising  APPs/Residents/Students Critical care was necessary to treat or prevent imminent or life-threatening deterioration due to toxic metabolic encephalopathy, resolved status epilepticus/seizures This patient is critically ill and at significant risk for neurological worsening and/or death and care requires constant monitoring. Critical care was time spent personally by me on the following activities: development of treatment plan with patient and/or surrogate as well as nursing, discussions with consultants, evaluation of patient's response to treatment, examination of patient, obtaining history from patient or surrogate, ordering and performing treatments and interventions, ordering and review of laboratory studies, ordering and review of radiographic studies, pulse oximetry, re-evaluation of patient's condition, participation in multidisciplinary rounds and medical decision making of high complexity in the care of this patient.

## 2018-12-24 NOTE — Progress Notes (Signed)
NAME:  Jeffrey [name], MRN:  [id number], DOB:  1961/01/03, LOS: 8 ADMISSION DATE:  12/16/2018,  REFERRING MD: [facility] emergency department physician, CHIEF COMPLAINT: Altered mental status, elevated ammonia level  Brief History   58 yo male smoker with altered mental status, agitation.  Intubated for airway protection.  Elevated ammonia level (274).  UDS positive for cocaine, THC.  Hx of ETOH, Bipolar, COPD.  Past Medical History  COPD with chronic tobacco abuse Bipolar Polysubstance abuse  Significant Hospital Events   12/16/2018 intubated 1/27 alcohol withdrawal seizure   Consults:    Procedures:  12/16/2018 intubation>> 1/25 Failed extubation, re-intubated   Significant Diagnostic Tests:  12/16/2018 CT head >> negative  Micro Data:  1/24 Resp Cx >> Klebsiella pneumoniae  1/24 Blood Cx >> Neg. Final 1/24 Urine Culture >> NG (final)  1/26 Resp Cx >> Strep Pneumoniae  1/31 Resp Cx >> Yeast. Pending Antimicrobials:  1/26 Ceftriaxone >> dc'd 1/27 1/27 Ancef >> dc'd 1/28 1/28 >>2/3 Ceftriaxone   Interim history/subjective:  Following commands this morning with Neuro however not with me on exam.   Objective   Blood pressure (!) 92/58, pulse 94, temperature (!) 100.5 F (38.1 C), temperature source Axillary, resp. rate 20, height 5\' 6"  (1.676 m), weight 72 kg, SpO2 96 %.    Vent Mode: PRVC FiO2 (%):  [30 %-40 %] 40 % Set Rate:  [20 bmp] 20 bmp Vt Set:  [510 mL] 510 mL PEEP:  [5 cmH20] 5 cmH20 Plateau Pressure:  [17 cmH20-21 cmH20] 20 cmH20   Intake/Output Summary (Last 24 hours) at 12/24/2018 0919 Last data filed at 12/24/2018 0800 Gross per 24 hour  Intake 1865.46 ml  Output 1875 ml  Net -9.54 ml   Filed Weights   12/22/18 0340 12/23/18 0330 12/24/18 0500  Weight: 71.6 kg 72.1 kg 72 kg   Physical Exam: General: Sedated and intubated. Laying comfortably HENT: [place], AT, ETT in place Eyes: EOMI, no scleral icterus Respiratory: Clear to auscultation  bilaterally.  No crackles, wheezing or rales Cardiovascular: RRR, -M/R/G, no JVD GI: BS+, soft, nontender Extremities:Minimal pitting pedal edema,-tenderness Neuro: Sedated. Not following commands  Resolved Hospital Problem list     Assessment & Plan:   Acute Metabolic Encephalopathy secondary to alcohol and polysubstance abuse  UDD + cocaine, THC Concerned for alcohol withdrawal seizure on 1/27 and second seizure like episode yesterday 1/30.  MRI brain yesterday unremarkable. -- Scheduled Ativan  -- Wean fentanyl gtt and precedex gtt for RASS goal 0 to -1 -- Clonidine taper per pharmacy  -- Continue CIWA with prn ativan -- Seroquel 50 mg BID -- Thiamine 500 mg TID x 3 days followed then 100 mg daily -- Folic acid  Acute Hypoxic Respiratory Failure COPD exacerbation Secondary to Klebsiella & Strep pneumo pneumonia. Failed extubation 1/25 -- Continue ceftriaxone (day 5/7) -- Completed 5d prednisone -- SBT at tolerated -- Scheduled and PRN nebulizers -- Diureses as tolerated  Mild Hypernatremia: Resolved -- Discontinue free water per tube   Hx Diastolic HF Hx HTN -- Holding home metoprolol -- Transition from IV lasix to home PO lasix  Hx of bipolar disease. -- Continue holding home depakote, zyprexa, prozac, and trazadone given mental status   Best practice:  Diet: Tube feeds DVT prophylaxis: Lovenox GI prophylaxis: Protonix Mobility: Bedrest Code Status: Full Family Communication: No family at bedside  The patient is critically ill with multiple organ systems failure and requires high complexity decision making for assessment and support, frequent evaluation  and titration of therapies, application of advanced monitoring technologies and extensive interpretation of multiple databases.   Critical Care Time devoted to patient care services described in this note is 35 Minutes. This time reflects time of care of this signee Dr. Mechele Collin. This critical care time  does not reflect procedure time, or teaching time or supervisory time of PA/NP/Med student/Med Resident etc but could involve care discussion time.

## 2018-12-25 LAB — CULTURE, RESPIRATORY W GRAM STAIN: Culture: NORMAL

## 2018-12-25 LAB — CULTURE, RESPIRATORY

## 2018-12-25 LAB — GLUCOSE, CAPILLARY
Glucose-Capillary: 105 mg/dL — ABNORMAL HIGH (ref 70–99)
Glucose-Capillary: 105 mg/dL — ABNORMAL HIGH (ref 70–99)
Glucose-Capillary: 92 mg/dL (ref 70–99)
Glucose-Capillary: 95 mg/dL (ref 70–99)
Glucose-Capillary: 97 mg/dL (ref 70–99)
Glucose-Capillary: 98 mg/dL (ref 70–99)

## 2018-12-25 NOTE — Progress Notes (Signed)
NAME:  Jeffrey [name], MRN:  [id number], DOB:  [birth year], LOS: 9 ADMISSION DATE:  12/16/2018,  REFERRING MD: [facility] emergency department physician, CHIEF COMPLAINT: Altered mental status, elevated ammonia level  Brief History   58 yo male smoker with altered mental status, agitation.  Intubated for airway protection.  Elevated ammonia level (274).  UDS positive for cocaine, THC.  Hx of ETOH, Bipolar, COPD.  Past Medical History  COPD with chronic tobacco abuse Bipolar Polysubstance abuse  Significant Hospital Events   12/16/2018 intubated 1/27 alcohol withdrawal seizure   Consults:    Procedures:  12/16/2018 intubation>> 1/25 Failed extubation, re-intubated   Significant Diagnostic Tests:  12/16/2018 CT head >> negative  Micro Data:  1/24 Resp Cx >> Klebsiella pneumoniae  1/24 Blood Cx >> Neg. Final 1/24 Urine Culture >> NG (final)  1/26 Resp Cx >> Strep Pneumoniae  1/31 Resp Cx >> Yeast. Pending Antimicrobials:  1/26 Ceftriaxone >> dc'd 1/27 1/27 Ancef >> dc'd 1/28 1/28 >>2/3 Ceftriaxone   Interim history/subjective:  Increased alertness , follows some intermittent commands  Weaning 8/5 today .  Family at bedside, and updated.   Objective   Blood pressure 98/64, pulse 78, temperature 98.8 F (37.1 C), temperature source Oral, resp. rate 16, height 5\' 6"  (1.676 m), weight 70.6 kg, SpO2 99 %.    Vent Mode: PSV;CPAP FiO2 (%):  [40 %-50 %] 40 % Set Rate:  [20 bmp] 20 bmp Vt Set:  [510 mL] 510 mL PEEP:  [5 cmH20] 5 cmH20 Pressure Support:  [5 cmH20-8 cmH20] 8 cmH20 Plateau Pressure:  [14 cmH20-20 cmH20] 14 cmH20   Intake/Output Summary (Last 24 hours) at 12/25/2018 1440 Last data filed at 12/25/2018 1400 Gross per 24 hour  Intake 1124.73 ml  Output 1725 ml  Net -600.27 ml   Filed Weights   12/23/18 0330 12/24/18 0500 12/25/18 0500  Weight: 72.1 kg 72 kg 70.6 kg   Physical Exam: General: Sedated and intubated. Laying comfortably HENT: [place], AT, ETT  in place Eyes: EOMI, no scleral icterus Respiratory: Clear to auscultation bilaterally.  No crackles, wheezing or rales Cardiovascular: RRR, -M/R/G, no JVD GI: BS+, soft, nontender Extremities:Minimal pitting pedal edema,-tenderness Neuro: Sedated. F/c intermittently   Resolved Hospital Problem list     Assessment & Plan:   Acute Metabolic Encephalopathy secondary to alcohol and polysubstance abuse  UDD + cocaine, THC Concerned for alcohol withdrawal seizure on 1/27 and second seizure like episode yesterday 1/30.  MRI brain yesterday unremarkable. -- Scheduled Ativan  -- Wean fentanyl gtt and precedex gtt for RASS goal 0 to -1 -- Clonidine taper per pharmacy  -- Continue CIWA with prn ativan -- Seroquel 50 mg BID -- Thiamine 500 mg TID x 3 days followed then 100 mg daily -- Folic acid  Acute Hypoxic Respiratory Failure COPD exacerbation Secondary to Klebsiella & Strep pneumo pneumonia. Failed extubation 1/25 Weaning but mentation limiting factor for extubation  -- Continue ceftriaxone (day 6/7) -- Completed 5d prednisone -- SBT at tolerated -- Scheduled and PRN nebulizers   Mild Hypernatremia: Resolved -- monitor   Hx Diastolic HF Hx HTN -- Holding home metoprolol --lasix on hold   Hx of bipolar disease. -- Continue holding home depakote, zyprexa, prozac, and trazadone given mental status   Best practice:  Diet: Tube feeds DVT prophylaxis: Lovenox GI prophylaxis: Protonix Mobility: Bedrest Code Status: Full Family Communication: family updated at bedside   Tammy Parrett NP-C  [facility]  [phone]  12/25/2018

## 2018-12-25 NOTE — Progress Notes (Addendum)
Neurology Progress Note   S://  Patient intubated. In bilateral wrist restraints. Opened eyes to name calling  No family at bedside.   O:// Current vital signs: BP 123/73   Pulse 88   Temp 98.8 F (37.1 C) (Oral)   Resp 20   Ht 5' 6"  (1.676 m) Comment: measured at bedside  Wt 70.6 kg   SpO2 98%   BMI 25.12 kg/m  Vital signs in last 24 hours: Temp:  [98.8 F (37.1 C)-100.7 F (38.2 C)] 98.8 F (37.1 C) (02/02 0349) Pulse Rate:  [83-101] 88 (02/02 0600) Resp:  [18-20] 20 (02/02 0600) BP: (76-123)/(51-73) 123/73 (02/02 0600) SpO2:  [89 %-100 %] 98 % (02/02 0600) FiO2 (%):  [40 %-50 %] 40 % (02/02 0400) Weight:  [70.6 kg] 70.6 kg (02/02 0500)  General: Intubated sedated with Precedex and fentanyl HEENT: Normocephalic atraumatic CVS: E3-M6 heard regular rate rhythm Respiratory: Vented, clear to auscultation Abdomen: Nondistended nontender Neurological Examination Mental Status: Patient is intubated and sedated. Able to follow slight commands attempted to raise Bilateral arms but could not.  Remains in bilateral soft wrist restraints.  Cranial Nerves: II:  Blinks to threat  III,IV, VI: ptosis not present, able to track examiner around room pupils equal, round, reactive to light and accommodation. Slight disconjugate gaze. V,VII: face appears  Symmetric in presence of ETT XII: tongue appears midline in presence of ETT MotorSensory Able to move all 4 extremities to noxious. Moves BUE more than BLE to noxious.  Did move BLE spontaneously as well. Not antigravity though Tone and bulk:normal tone throughout; no atrophy noted Cerebellar: deferred Gait: deferred  Medications  Current Facility-Administered Medications:  .  0.9 %  sodium chloride infusion, , Intravenous, PRN, Margaretha Seeds, MD, Last Rate: 10 mL/hr at 12/25/18 0400 .  acetaminophen (TYLENOL) tablet 650 mg, 650 mg, Per Tube, Q6H PRN **OR** acetaminophen (TYLENOL) suppository 650 mg, 650 mg, Rectal, Q6H PRN,  Halford Chessman, Vineet, MD .  albuterol (PROVENTIL) (2.5 MG/3ML) 0.083% nebulizer solution 2.5 mg, 2.5 mg, Nebulization, Q4H PRN, Opyd, Ilene Qua, MD, 2.5 mg at 12/18/18 0550 .  bisacodyl (DULCOLAX) suppository 10 mg, 10 mg, Rectal, Daily PRN, Maslonka, Matthew A, MD .  cefTRIAXone (ROCEPHIN) 2 g in sodium chloride 0.9 % 100 mL IVPB, 2 g, Intravenous, Q24H, Margaretha Seeds, MD, Stopped at 12/24/18 1310 .  chlorhexidine gluconate (MEDLINE KIT) (PERIDEX) 0.12 % solution 15 mL, 15 mL, Mouth Rinse, BID, Halford Chessman, Vineet, MD, 15 mL at 12/25/18 0746 .  cloNIDine (CATAPRES) tablet 0.3 mg, 0.3 mg, Per Tube, Q6H, Margaretha Seeds, MD, 0.3 mg at 12/25/18 0626 .  dexmedetomidine (PRECEDEX) 400 MCG/100ML (4 mcg/mL) infusion, 0.4-2 mcg/kg/hr, Intravenous, Continuous, Seawell, Jaimie A, DO, Last Rate: 28.6 mL/hr at 12/25/18 0742, 1.6 mcg/kg/hr at 12/25/18 0742 .  enoxaparin (LOVENOX) injection 40 mg, 40 mg, Subcutaneous, Q24H, Rush Farmer, MD, 40 mg at 12/25/18 0743 .  fentaNYL (SUBLIMAZE) bolus via infusion 50 mcg, 50 mcg, Intravenous, Q1H PRN, Minor, Grace Bushy, NP, 50 mcg at 12/25/18 0310 .  fentaNYL (SUBLIMAZE) injection 100 mcg, 100 mcg, Intravenous, Q2H PRN, Opyd, Ilene Qua, MD, 100 mcg at 12/24/18 0420 .  fentaNYL (SUBLIMAZE) injection 100 mcg, 100 mcg, Intravenous, Q2H PRN, Caffie Damme, MD, 50 mcg at 12/22/18 1400 .  fentaNYL 2565mg in NS 2512m(1012mml) infusion-PREMIX, 0-400 mcg/hr, Intravenous, Continuous, Seawell, Jaimie A, DO, Last Rate: 2.5 mL/hr at 12/25/18 0400, 25 mcg/hr at 12/25/18 0400 .  folic acid (FOLVITE) tablet 1 mg, 1  mg, Per Tube, Daily, Chesley Mires, MD, 1 mg at 12/24/18 0931 .  insulin aspart (novoLOG) injection 0-9 Units, 0-9 Units, Subcutaneous, Q4H, Anders Simmonds, MD, 1 Units at 12/24/18 1529 .  ipratropium-albuterol (DUONEB) 0.5-2.5 (3) MG/3ML nebulizer solution 3 mL, 3 mL, Nebulization, Q6H, Barton Dubois, MD, 3 mL at 12/25/18 0330 .  LORazepam (ATIVAN) injection 1 mg, 1 mg,  Intravenous, Q6H, Velna Ochs, MD, 1 mg at 12/25/18 0308 .  LORazepam (ATIVAN) injection 1-2 mg, 1-2 mg, Intravenous, Q1H PRN, Chesley Mires, MD, 2 mg at 12/24/18 1528 .  LORazepam (ATIVAN) injection 1-4 mg, 1-4 mg, Intravenous, Q4H PRN, Chesley Mires, MD, 4 mg at 12/20/18 0926 .  MEDLINE mouth rinse, 15 mL, Mouth Rinse, 10 times per day, Chesley Mires, MD, 15 mL at 12/25/18 0600 .  [DISCONTINUED] ondansetron (ZOFRAN) tablet 4 mg, 4 mg, Oral, Q6H PRN **OR** ondansetron (ZOFRAN) injection 4 mg, 4 mg, Intravenous, Q6H PRN, Opyd, Ilene Qua, MD, 4 mg at 12/23/18 1342 .  pantoprazole sodium (PROTONIX) 40 mg/20 mL oral suspension 40 mg, 40 mg, Per Tube, Q24H, Sood, Vineet, MD, 40 mg at 12/24/18 1240 .  polyethylene glycol (MIRALAX / GLYCOLAX) packet 17 g, 17 g, Per Tube, Daily, Velna Ochs, MD, 17 g at 12/24/18 0931 .  QUEtiapine (SEROQUEL) tablet 50 mg, 50 mg, Per Tube, BID, Margaretha Seeds, MD, 50 mg at 12/24/18 2353 .  senna-docusate (Senokot-S) tablet 1 tablet, 1 tablet, Per Tube, Daily, Margaretha Seeds, MD, 1 tablet at 12/24/18 [phone] .  sodium chloride flush (NS) 0.9 % injection 10-40 mL, 10-40 mL, Intracatheter, Q12H, Chesley Mires, MD, 10 mL at 12/24/18 2208 .  sodium chloride flush (NS) 0.9 % injection 10-40 mL, 10-40 mL, Intracatheter, PRN, Halford Chessman, Vineet, MD .  sodium chloride flush (NS) 0.9 % injection 3 mL, 3 mL, Intravenous, Q12H, Opyd, Ilene Qua, MD, 3 mL at 12/24/18 2208 .  thiamine (VITAMIN B-1) tablet 500 mg, 500 mg, Per Tube, TID, 500 mg at 12/24/18 2353 **FOLLOWED BY** thiamine (VITAMIN B-1) tablet 100 mg, 100 mg, Per Tube, BID, Velna Ochs, MD Labs CBC    Component Value Date/Time   WBC 11.3 (H) 12/24/2018 0334   RBC 3.57 (L) 12/24/2018 0334   HGB 10.7 (L) 12/24/2018 0334   HCT 33.7 (L) 12/24/2018 0334   HCT 37.7 12/16/2018 0547   PLT 274 12/24/2018 0334   MCV 94.4 12/24/2018 0334   MCH 30.0 12/24/2018 0334   MCHC 31.8 12/24/2018 0334   RDW 13.2 12/24/2018 0334    LYMPHSABS 0.7 12/17/2018 0325   MONOABS 0.8 12/17/2018 0325   EOSABS 0.0 12/17/2018 0325   BASOSABS 0.0 12/17/2018 0325    CMP     Component Value Date/Time   NA 143 12/24/2018 0334   K 4.1 12/24/2018 0334   CL 99 12/24/2018 0334   CO2 33 (H) 12/24/2018 0334   GLUCOSE 111 (H) 12/24/2018 0334   BUN 40 (H) 12/24/2018 0334   CREATININE 0.95 12/24/2018 0334   CREATININE 1.12 05/02/2013 0940   CALCIUM 8.7 (L) 12/24/2018 0334   PROT 5.0 (L) 12/18/2018 0423   ALBUMIN 2.3 (L) 12/18/2018 0423   AST 28 12/18/2018 0423   ALT 14 12/18/2018 0423   ALKPHOS 55 12/18/2018 0423   BILITOT 0.7 12/18/2018 0423   GFRNONAA >60 12/24/2018 0334   GFRAA >60 12/24/2018 0334   Imaging I have reviewed images in epic and the results pertinent to this consultation are: MRI of the brain done on 12/22/2018 shows  no acute abnormalities.  Assessment: 58 year old man with ongoing encephalopathy likely secondary to alcohol and substance abuse withdrawal.  Unclear description of seizures but could be withdrawal. Current presentation could be attributed to prolonged delirium tremens Repeat EEG yesterday showed generalized slowing and no focality.  Impression: Toxic metabolic encephalopathy-?  Prolonged Delirium tremens Resolved status epilepticus/seizures  Recommendations: --Continue to lighten sedation as tolerated --Continue high-dose thiamine 500 mg 3 times daily for 3 days --Continue scheduled benzos for withdrawal --Continue seizure precautions --Do not see a need for antiepileptics for now --I would expect the mental status to improve with time. --Neurology to sign off at this time.  Please call neurology with questions.   Laurey Morale, MSN, NP-C [facility] [phone]  Attending Neurohospitalist Addendum Patient seen and examined with APP/Resident. Agree with the history and physical as documented above. Agree with the plan as documented, which I helped formulate. I have  independently reviewed the chart, obtained history, review of systems and examined the patient.I have personally reviewed pertinent head/neck/spine imaging (CT/MRI). Please feel free to call with any questions. --- Amie Portland, MD [facility] Pager: [phone]  If 7pm to 7am, please call on call as listed on AMION.

## 2018-12-25 NOTE — Progress Notes (Signed)
Pt minimal bowel sounds and distended abdomen throughout day (status remains as days prior per report). Inmt suction turned on at 1830, immediate return of green liquid from stomach. MD notified. NPO throughout day except for meds. Suction was paused after med admin for ample absorption time.   Aloha Gell, RN

## 2018-12-25 NOTE — Plan of Care (Signed)

## 2018-12-26 ENCOUNTER — Inpatient Hospital Stay (HOSPITAL_COMMUNITY): Payer: Medicare Other

## 2018-12-26 LAB — CBC
HCT: 31.5 % — ABNORMAL LOW (ref 39.0–52.0)
Hemoglobin: 9.8 g/dL — ABNORMAL LOW (ref 13.0–17.0)
MCH: 29.2 pg (ref 26.0–34.0)
MCHC: 31.1 g/dL (ref 30.0–36.0)
MCV: 93.8 fL (ref 80.0–100.0)
Platelets: 253 10*3/uL (ref 150–400)
RBC: 3.36 MIL/uL — ABNORMAL LOW (ref 4.22–5.81)
RDW: 12.4 % (ref 11.5–15.5)
WBC: 6.7 10*3/uL (ref 4.0–10.5)
nRBC: 0 % (ref 0.0–0.2)

## 2018-12-26 LAB — GLUCOSE, CAPILLARY
Glucose-Capillary: 103 mg/dL — ABNORMAL HIGH (ref 70–99)
Glucose-Capillary: 74 mg/dL (ref 70–99)
Glucose-Capillary: 79 mg/dL (ref 70–99)
Glucose-Capillary: 80 mg/dL (ref 70–99)
Glucose-Capillary: 81 mg/dL (ref 70–99)
Glucose-Capillary: 92 mg/dL (ref 70–99)

## 2018-12-26 LAB — BASIC METABOLIC PANEL
Anion gap: 11 (ref 5–15)
BUN: 25 mg/dL — ABNORMAL HIGH (ref 6–20)
CO2: 27 mmol/L (ref 22–32)
Calcium: 8.1 mg/dL — ABNORMAL LOW (ref 8.9–10.3)
Chloride: 108 mmol/L (ref 98–111)
Creatinine, Ser: 0.71 mg/dL (ref 0.61–1.24)
GFR calc Af Amer: >60 mL/min (ref >60)
GFR calc non Af Amer: >60 mL/min (ref >60)
Glucose, Bld: 105 mg/dL — ABNORMAL HIGH (ref 70–99)
Potassium: 3.4 mmol/L — ABNORMAL LOW (ref 3.5–5.1)
Sodium: 146 mmol/L — ABNORMAL HIGH (ref 135–145)

## 2018-12-26 MED ORDER — LACTATED RINGERS IV BOLUS
500.0000 mL | Freq: Once | INTRAVENOUS | Status: AC
  Administered 2018-12-26: 500 mL via INTRAVENOUS

## 2018-12-26 MED ORDER — SODIUM CHLORIDE 0.45 % IV SOLN
INTRAVENOUS | Status: DC
  Administered 2018-12-26: 14:00:00 via INTRAVENOUS
  Administered 2018-12-27: 50 mL/h via INTRAVENOUS
  Administered 2018-12-28: 06:00:00 via INTRAVENOUS

## 2018-12-26 NOTE — Progress Notes (Signed)
Pt not tolerating Fentanyl gtt wean, unable to maintain appropriate pressures during ventilation due to agitation/alertness. Mannam MD notified, instructed RN to increase Fentanyl gtt.  Aloha Gell, RN

## 2018-12-26 NOTE — Progress Notes (Signed)
NAME:  Jeffrey [name], MRN:  [id number], DOB:  1961-09-09, LOS: 10 ADMISSION DATE:  12/16/2018,  REFERRING MD: [facility] emergency department physician, CHIEF COMPLAINT: Altered mental status, elevated ammonia level  Brief History   58 yo male smoker with altered mental status, agitation.  Intubated for airway protection.  Elevated ammonia level (274).  UDS positive for cocaine, THC.  Hx of ETOH, Bipolar, COPD.  Past Medical History  COPD with chronic tobacco abuse Bipolar Polysubstance abuse  Significant Hospital Events   12/16/2018 intubated 1/27 alcohol withdrawal seizure   Consults:    Procedures:  12/16/2018 intubation>> 1/25 Failed extubation, re-intubated   Significant Diagnostic Tests:  12/16/2018 CT head >> negative  Micro Data:  1/24 Resp Cx >> Klebsiella pneumoniae  1/24 Blood Cx >> Neg. Final 1/24 Urine Culture >> NG (final)  1/26 Resp Cx >> Strep Pneumoniae  1/31 Resp Cx >> Yeast. Pending Antimicrobials:  1/26 Ceftriaxone >> dc'd 1/27 1/27 Ancef >> dc'd 1/28 1/28 >>2/3 Ceftriaxone   Interim history/subjective:  Transient hypotension today morning which responded to bolus of IV fluid Sedated but opens eyes.  Denies any pain, dyspnea.  Objective   Blood pressure 116/70, pulse 89, temperature 97.8 F (36.6 C), temperature source Oral, resp. rate 20, height 5\' 6"  (1.676 m), weight 70.6 kg, SpO2 96 %.    Vent Mode: PRVC FiO2 (%):  [40 %] 40 % Set Rate:  [20 bmp] 20 bmp Vt Set:  [510 mL] 510 mL PEEP:  [5 cmH20] 5 cmH20 Pressure Support:  [5 cmH20-8 cmH20] 5 cmH20 Plateau Pressure:  [14 cmH20-22 cmH20] 22 cmH20   Intake/Output Summary (Last 24 hours) at 12/26/2018 1059 Last data filed at 12/26/2018 1000 Gross per 24 hour  Intake 1636.65 ml  Output 1400 ml  Net 236.65 ml   Filed Weights   12/23/18 0330 12/24/18 0500 12/25/18 0500  Weight: 72.1 kg 72 kg 70.6 kg   Physical Exam: Gen:      No acute distress HEENT:  EOMI, sclera anicteric Neck:      No masses; no thyromegaly, ET tube Lungs:    Clear to auscultation bilaterally; normal respiratory effort CV:         Regular rate and rhythm; no murmurs Abd:      + bowel sounds; soft, non-tender; no palpable masses, no distension Ext:    No edema; adequate peripheral perfusion Skin:      Warm and dry; no rash Neuro: Sedated, responds to commands.   Resolved Hospital Problem list     Assessment & Plan:   Acute Metabolic Encephalopathy secondary to alcohol and polysubstance abuse  UDD + cocaine, THC Concerned for alcohol withdrawal seizure on 1/27 and second seizure like episode yesterday 1/30.  MRI brain yesterday unremarkable. Continue Ativan Wean down fentanyl drip.  Continue Precedex for now Clonidine taper per pharmacy CIWA protocol Continue Seroquel, thiamine, folic acid   Acute Hypoxic Respiratory Failure COPD exacerbation Secondary to Klebsiella & Strep pneumo pneumonia. Failed extubation 1/25 Weaning but mentation limiting factor for extubation  Finish ceftriaxone Off steroids Pressure support weans Continue nebs.  Mild Hypernatremia: Resolved Free water  Hx Diastolic HF Hx HTN Holding lasix, lopressor  Hx of bipolar disease. Holding home depakote, zyprexa, prozac, and trazadone  Best practice:  Diet: Tube feeds DVT prophylaxis: Lovenox GI prophylaxis: Protonix Mobility: Bedrest Code Status: Full Family Communication: family updated at bedside   The patient is critically ill with multiple organ system failure and requires high complexity decision  making for assessment and support, frequent evaluation and titration of therapies, advanced monitoring, review of radiographic studies and interpretation of complex data.   Critical Care Time devoted to patient care services, exclusive of separately billable procedures, described in this note is 35 minutes.   Chilton Greathouse MD [facility] Pager [phone] If no answer call 336  [phone] 12/26/2018, 12:48 PM

## 2018-12-26 NOTE — Progress Notes (Signed)
Verbally instructed by Mannam MD to hold per tube meds at this time (NPO order in place) until further notice  due to ileus.   Aloha Gell, RN

## 2018-12-26 NOTE — Progress Notes (Signed)
Pt becoming hypotensive, Syst in 80s, MAP still 65 or greater. MD paged.  Scheduled Ativan/Clonidine held at this time. Sedation gtts decreased. Will continue to monitor.   Aloha Gell, RN

## 2018-12-27 ENCOUNTER — Inpatient Hospital Stay (HOSPITAL_COMMUNITY): Payer: Medicare Other

## 2018-12-27 LAB — GLUCOSE, CAPILLARY
Glucose-Capillary: 85 mg/dL (ref 70–99)
Glucose-Capillary: 82 mg/dL (ref 70–99)
Glucose-Capillary: 62 mg/dL — ABNORMAL LOW (ref 70–99)
Glucose-Capillary: 71 mg/dL (ref 70–99)
Glucose-Capillary: 72 mg/dL (ref 70–99)
Glucose-Capillary: 91 mg/dL (ref 70–99)

## 2018-12-27 LAB — BASIC METABOLIC PANEL
Anion gap: 9 (ref 5–15)
BUN: 25 mg/dL — ABNORMAL HIGH (ref 6–20)
CO2: 25 mmol/L (ref 22–32)
Calcium: 7.8 mg/dL — ABNORMAL LOW (ref 8.9–10.3)
Chloride: 111 mmol/L (ref 98–111)
Creatinine, Ser: 0.79 mg/dL (ref 0.61–1.24)
GFR calc Af Amer: >60 mL/min (ref >60)
GFR calc non Af Amer: >60 mL/min (ref >60)
Glucose, Bld: 88 mg/dL (ref 70–99)
Potassium: 3.6 mmol/L (ref 3.5–5.1)
Sodium: 145 mmol/L (ref 135–145)

## 2018-12-27 LAB — CBC
HCT: 32.2 % — ABNORMAL LOW (ref 39.0–52.0)
Hemoglobin: 9.7 g/dL — ABNORMAL LOW (ref 13.0–17.0)
MCH: 29 pg (ref 26.0–34.0)
MCHC: 30.1 g/dL (ref 30.0–36.0)
MCV: 96.1 fL (ref 80.0–100.0)
Platelets: 252 10*3/uL (ref 150–400)
RBC: 3.35 MIL/uL — ABNORMAL LOW (ref 4.22–5.81)
RDW: 12.8 % (ref 11.5–15.5)
WBC: 6.6 10*3/uL (ref 4.0–10.5)
nRBC: 0 % (ref 0.0–0.2)

## 2018-12-27 LAB — PHOSPHORUS: Phosphorus: 3.6 mg/dL (ref 2.5–4.6)

## 2018-12-27 LAB — MAGNESIUM: Magnesium: 2 mg/dL (ref 1.7–2.4)

## 2018-12-27 MED ORDER — PANTOPRAZOLE SODIUM 40 MG IV SOLR
40.0000 mg | INTRAVENOUS | Status: DC
  Administered 2018-12-27 – 2019-01-09 (×14): 40 mg via INTRAVENOUS
  Filled 2018-12-27 (×14): qty 40

## 2018-12-27 MED ORDER — FOLIC ACID 5 MG/ML IJ SOLN
1.0000 mg | Freq: Every day | INTRAMUSCULAR | Status: DC
  Administered 2018-12-27 – 2019-01-12 (×17): 1 mg via INTRAVENOUS
  Filled 2018-12-27 (×25): qty 0.2

## 2018-12-27 MED ORDER — CLONIDINE HCL 0.3 MG/24HR TD PTWK
0.3000 mg | MEDICATED_PATCH | TRANSDERMAL | Status: DC
  Filled 2018-12-27 (×2): qty 1

## 2018-12-27 MED ORDER — SODIUM CHLORIDE 0.9 % IV BOLUS
1000.0000 mL | Freq: Once | INTRAVENOUS | Status: AC
  Administered 2018-12-27: 1000 mL via INTRAVENOUS

## 2018-12-27 MED ORDER — VITAL AF 1.2 CAL PO LIQD
1000.0000 mL | ORAL | Status: DC
  Administered 2018-12-27: 1000 mL

## 2018-12-27 MED ORDER — THIAMINE HCL 100 MG/ML IJ SOLN
100.0000 mg | Freq: Every day | INTRAMUSCULAR | Status: DC
  Administered 2018-12-27 – 2019-01-12 (×17): 100 mg via INTRAVENOUS
  Filled 2018-12-27 (×16): qty 2

## 2018-12-27 NOTE — Progress Notes (Signed)
Nutrition Follow-up  DOCUMENTATION CODES:   Not applicable  INTERVENTION:    Start trickle feedings with Vital AF 1.2 at 20 ml/h via OGT.  If tolerating TF well tomorrow, recommend increase TF rate by 10 ml every 4 hours to goal rate of 65 ml/h to provide 1872 kcal, 117 gm protein, 1265 ml free water daily.  If patient does not tolerate TF, recommend place a post-pyloric Cortrak feeding tube.   NUTRITION DIAGNOSIS:   Inadequate oral intake related to inability to eat as evidenced by NPO status.  Ongoing   GOAL:   Patient will meet greater than or equal to 90% of their needs  Progressing   MONITOR:   Vent status, TF tolerance, Labs, I & O's  ASSESSMENT:   58 yo male with PMH of [name], bipolar d/o, alcohol & marajuana abuse, [name], COPD, HTN who was admitted AMS, elevated ammonia level requiring intubation on admission.  Patient remains intubated on ventilator support MV: 9.8 L/min Temp (24hrs), Avg:99.1 F (37.3 C), Min:98 F (36.7 C), Max:99.9 F (37.7 C)   TF has been on hold since 1/31 due to ileus. Abdomen less tight today per RN. Spoke with CCM physician, okay to resume TF via OGT at trickle rate.   Labs reviewed.  Medications reviewed and include folic acid, thiamine, precedex, fentanyl.   Diet Order:   Diet Order            Diet NPO time specified  Diet effective now              EDUCATION NEEDS:   No education needs have been identified at this time  Skin:  Skin Assessment: Reviewed RN Assessment  Last BM:  1/31  Height:   Ht Readings from Last 1 Encounters:  12/18/18 5\' 6"  (1.676 m)    Weight:   Wt Readings from Last 1 Encounters:  12/27/18 71.6 kg    Ideal Body Weight:  64.5 kg  BMI:  Body mass index is 25.48 kg/m.  Estimated Nutritional Needs:   Kcal:  1810  Protein:  100-115 gm  Fluid:  >/= 1.7 L    Joaquin Courts, RD, LDN, CNSC Pager [phone] After Hours Pager [phone]

## 2018-12-27 NOTE — Progress Notes (Signed)
eLink Physician-Brief Progress Note Patient Name: Jeffrey [name] DOB: 1961/10/13 MRN: [id number]   Date of Service  12/27/2018  HPI/Events of Note  Hypotension - BP = 81/46 with MAP = 57. LVEF = 65-70%.  eICU Interventions  Will order: 1. Bolus with 0.9 NaCl 1 liter IV over 1 hour now.      Intervention Category Major Interventions: Hypotension - evaluation and management  Jamicheal Heard [place] 12/27/2018, 6:15 AM

## 2018-12-27 NOTE — Progress Notes (Signed)
NAME:  ELVER GIACONA, MRN:  [id number], DOB:  [birth year], LOS: 11 ADMISSION DATE:  12/16/2018,  REFERRING MD: [facility] emergency department physician, CHIEF COMPLAINT: Altered mental status, elevated ammonia level  Brief History   58 yo male smoker with altered mental status, agitation.  Intubated for airway protection.  Elevated ammonia level (274).  UDS positive for cocaine, THC.  Hx of ETOH, Bipolar, COPD.  Past Medical History  COPD with chronic tobacco abuse Bipolar Polysubstance abuse  Significant Hospital Events   12/16/2018 intubated 1/27 alcohol withdrawal seizure   Consults:    Procedures:  12/16/2018 intubation>> 1/25 Failed extubation, re-intubated   Significant Diagnostic Tests:  12/16/2018 CT head >> negative  Micro Data:  1/24 Resp Cx >> Klebsiella pneumoniae  1/24 Blood Cx >> Neg. Final 1/24 Urine Culture >> NG (final)  1/26 Resp Cx >> Strep Pneumoniae  1/31 Resp Cx >> Yeast. Pending Antimicrobials:  1/26 Ceftriaxone >> dc'd 1/27 1/27 Ancef >> dc'd 1/28 1/28 >>2/3 Ceftriaxone   Interim history/subjective:  Extremely combative when sedation decreased this a.m. 12/27/2018 chest x-ray continues to improve, reviewed. Objective   Blood pressure (!) 184/116, pulse 80, temperature 99.8 F (37.7 C), temperature source Oral, resp. rate 20, height 5\' 6"  (1.676 m), weight 71.6 kg, SpO2 96 %.    Vent Mode: PRVC FiO2 (%):  [40 %] 40 % Set Rate:  [20 bmp] 20 bmp Vt Set:  [510 mL] 510 mL PEEP:  [5 cmH20] 5 cmH20 Pressure Support:  [8 cmH20] 8 cmH20 Plateau Pressure:  [14 cmH20-26 cmH20] 23 cmH20   Intake/Output Summary (Last 24 hours) at 12/27/2018 0941 Last data filed at 12/27/2018 0600 Gross per 24 hour  Intake 2492.18 ml  Output 875 ml  Net 1617.18 ml   Filed Weights   12/24/18 0500 12/25/18 0500 12/27/18 0500  Weight: 72 kg 70.6 kg 71.6 kg   Physical Exam: General: Currently requiring higher IV sedations became becomes extremely combative when  sedation was decreased HEENT: Tracheal tube in place Neuro: Either sedated or agitated CV: s1s2 rrr, no m/r/g PULM: even/non-labored, lungs bilaterally managed in bases GI: Distended, faint bowel sounds, tube feedings are on hold due to ileus Extremities: warm/dry, 1+ edema, 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  MRI brain unremarkable Continue Ativan Sedation fentanyl drip Clonidine per pharmacy.  Change to patch due to ileus CIWA protocol Resume antipsychotics when got available   Acute Hypoxic Respiratory Failure COPD exacerbation Secondary to Klebsiella & Strep pneumo pneumonia. Failed extubation 1/25 Weaning but mentation limiting factor for extubation  Completed antimicrobial therapy Agitated now that he is off antipsychotics due to ileus Utilize IV medication to control his agitation Not ready for weaning at this time.  Mild Hypernatremia:  Recent Labs  Lab 12/24/18 0334 12/26/18 0427 12/27/18 0355  NA 143 146* 145    Continue to monitor  Hx Diastolic HF Hx HTN Monitor Signs at this time  Hx of bipolar disease. 12/27/2018 antipsychotics on hold due to ileus Utilizing IV medication  Best practice:  Diet: Tube feeds DVT prophylaxis: Lovenox GI prophylaxis: Protonix Mobility: Bedrest Code Status: Full Family Communication: 12/27/2018 son updated at bedside  App cct 30  min   Brett Canales Veasna Santibanez ACNP [facility] PCC[facility] Pager [phone] till 1 pm If no answer page [phone] 12/27/2018, 9:41 AM

## 2018-12-28 ENCOUNTER — Inpatient Hospital Stay (HOSPITAL_COMMUNITY): Payer: Medicare Other

## 2018-12-28 LAB — BASIC METABOLIC PANEL
Anion gap: 7 (ref 5–15)
BUN: 16 mg/dL (ref 6–20)
CO2: 26 mmol/L (ref 22–32)
Calcium: 7.7 mg/dL — ABNORMAL LOW (ref 8.9–10.3)
Chloride: 112 mmol/L — ABNORMAL HIGH (ref 98–111)
Creatinine, Ser: 0.68 mg/dL (ref 0.61–1.24)
GFR calc Af Amer: >60 mL/min (ref >60)
GFR calc non Af Amer: >60 mL/min (ref >60)
Glucose, Bld: 91 mg/dL (ref 70–99)
Potassium: 3.1 mmol/L — ABNORMAL LOW (ref 3.5–5.1)
Sodium: 145 mmol/L (ref 135–145)

## 2018-12-28 LAB — CBC WITH DIFFERENTIAL/PLATELET
Abs Immature Granulocytes: 0.04 10*3/uL (ref 0.00–0.07)
Basophils Absolute: 0 10*3/uL (ref 0.0–0.1)
Basophils Relative: 0 %
Eosinophils Absolute: 0 10*3/uL (ref 0.0–0.5)
Eosinophils Relative: 1 %
HCT: 29.3 % — ABNORMAL LOW (ref 39.0–52.0)
Hemoglobin: 9.3 g/dL — ABNORMAL LOW (ref 13.0–17.0)
Immature Granulocytes: 1 %
Lymphocytes Relative: 14 %
Lymphs Abs: 0.8 10*3/uL (ref 0.7–4.0)
MCH: 29.9 pg (ref 26.0–34.0)
MCHC: 31.7 g/dL (ref 30.0–36.0)
MCV: 94.2 fL (ref 80.0–100.0)
Monocytes Absolute: 0.3 10*3/uL (ref 0.1–1.0)
Monocytes Relative: 6 %
Neutro Abs: 4.4 10*3/uL (ref 1.7–7.7)
Neutrophils Relative %: 78 %
Platelets: 221 10*3/uL (ref 150–400)
RBC: 3.11 MIL/uL — ABNORMAL LOW (ref 4.22–5.81)
RDW: 12.3 % (ref 11.5–15.5)
WBC: 5.6 10*3/uL (ref 4.0–10.5)
nRBC: 0 % (ref 0.0–0.2)

## 2018-12-28 LAB — GLUCOSE, CAPILLARY
Glucose-Capillary: 100 mg/dL — ABNORMAL HIGH (ref 70–99)
Glucose-Capillary: 70 mg/dL (ref 70–99)
Glucose-Capillary: 81 mg/dL (ref 70–99)
Glucose-Capillary: 88 mg/dL (ref 70–99)
Glucose-Capillary: 90 mg/dL (ref 70–99)
Glucose-Capillary: 93 mg/dL (ref 70–99)

## 2018-12-28 LAB — PHOSPHORUS: Phosphorus: 2.9 mg/dL (ref 2.5–4.6)

## 2018-12-28 LAB — MAGNESIUM: Magnesium: 1.9 mg/dL (ref 1.7–2.4)

## 2018-12-28 MED ORDER — POTASSIUM CHLORIDE 10 MEQ/100ML IV SOLN
10.0000 meq | INTRAVENOUS | Status: AC
  Administered 2018-12-28 (×6): 10 meq via INTRAVENOUS
  Filled 2018-12-28 (×6): qty 100

## 2018-12-28 MED ORDER — FUROSEMIDE 10 MG/ML IJ SOLN
40.0000 mg | Freq: Two times a day (BID) | INTRAMUSCULAR | Status: AC
  Administered 2018-12-28 (×2): 40 mg via INTRAVENOUS
  Filled 2018-12-28 (×2): qty 4

## 2018-12-28 MED ORDER — POTASSIUM CHLORIDE 20 MEQ/15ML (10%) PO SOLN
30.0000 meq | ORAL | Status: AC
  Administered 2018-12-28: 30 meq
  Filled 2018-12-28: qty 30

## 2018-12-28 MED ORDER — ORAL CARE MOUTH RINSE
15.0000 mL | Freq: Two times a day (BID) | OROMUCOSAL | Status: DC
  Administered 2018-12-29 – 2019-01-07 (×12): 15 mL via OROMUCOSAL

## 2018-12-28 MED ORDER — VALPROIC ACID 250 MG/5ML PO SOLN
500.0000 mg | Freq: Three times a day (TID) | ORAL | Status: DC
  Administered 2018-12-28 – 2018-12-30 (×5): 500 mg via ORAL
  Filled 2018-12-28 (×17): qty 10

## 2018-12-28 NOTE — Progress Notes (Signed)
[facility] ADULT ICU REPLACEMENT PROTOCOL FOR AM LAB REPLACEMENT ONLY  The patient does apply for the [facility] Adult ICU Electrolyte Replacment Protocol based on the criteria listed below:   1. Is GFR >/= 40 ml/min? Yes.    Patient's GFR today is >60 2. Is urine output >/= 0.5 ml/kg/hr for the last 6 hours? Yes.   Patient's UOP is 36.85 ml/kg/hr 3. Is BUN < 60 mg/dL? Yes.    Patient's BUN today is 16 4. Abnormal electrolyte(s): K- 3.1 5. Ordered repletion with: K solution  q4hrs x 2 doses. 6. If a panic level lab has been reported, has the CCM MD in charge been notified? Yes.  .   Physician:  Dr. Orma Render, Bianna Haran [place] 12/28/2018 6:14 AM

## 2018-12-28 NOTE — Procedures (Signed)
Extubation Procedure Note  Patient Details:   Name: Jeffrey [name] DOB: [birth year] MRN: [id number]   Airway Documentation:    Vent end date: 12/28/18 Vent end time: 1047   Evaluation  O2 sats: stable throughout Complications: No apparent complications Patient did tolerate procedure well. Bilateral Breath Sounds: Clear, Diminished   Pt extubated per MD order.  Pt had +cuff leak.  Placed on 4L [place] tolerating well.   Cherylin Mylar 12/28/2018, 11:20 AM

## 2018-12-28 NOTE — Progress Notes (Signed)
NAME:  CARLISS KATAOKA, MRN:  [id number], DOB:  08/04/1961, LOS: 12 ADMISSION DATE:  12/16/2018,  REFERRING MD: [facility] emergency department physician, CHIEF COMPLAINT: Altered mental status, elevated ammonia level  Brief History   58 yo male smoker with altered mental status, agitation.  Intubated for airway protection.  Elevated ammonia level (274).  UDS positive for cocaine, THC.  Hx of ETOH, Bipolar, COPD.  Past Medical History  COPD with chronic tobacco abuse Bipolar Polysubstance abuse  Significant Hospital Events   12/16/2018 intubated 1/27 alcohol withdrawal seizure   Consults:    Procedures:  12/16/2018 intubation>> 1/25 Failed extubation, re-intubated   Significant Diagnostic Tests:  12/16/2018 CT head >> negative  Micro Data:  1/24 Resp Cx >> Klebsiella pneumoniae  1/24 Blood Cx >> Neg. Final 1/24 Urine Culture >> NG (final)  1/26 Resp Cx >> Strep Pneumoniae  1/31 Resp Cx >> Yeast.  Antimicrobials:  1/26 Ceftriaxone >> dc'd 1/27 1/27 Ancef >> dc'd 1/28 1/28 >>2/3 Ceftriaxone   Interim history/subjective:  12/28/2018 attempting to wean sedation. Diuresis will be attempted He may need to be extubated outside normal parameters for proceed with tracheostomy near future as he is day 12 of intubation Objective   Blood pressure (!) 107/58, pulse 85, temperature 98 F (36.7 C), temperature source Oral, resp. rate 13, height 5\' 6"  (1.676 m), weight 73.7 kg, SpO2 96 %.    Vent Mode: PSV;CPAP FiO2 (%):  [30 %-40 %] 30 % Set Rate:  [20 bmp] 20 bmp Vt Set:  [510 mL] 510 mL PEEP:  [5 cmH20] 5 cmH20 Pressure Support:  [5 cmH20] 5 cmH20 Plateau Pressure:  [16 cmH20-23 cmH20] 16 cmH20   Intake/Output Summary (Last 24 hours) at 12/28/2018 6433 Last data filed at 12/28/2018 0600 Gross per 24 hour  Intake 2250.26 ml  Output 645 ml  Net 1605.26 ml   Filed Weights   12/25/18 0500 12/27/18 0500 12/28/18 0314  Weight: 70.6 kg 71.6 kg 73.7 kg   Physical  Exam: General: Middle-aged male heavily sedated HEENT: Endotracheal tube in place orogastric tube in place Neuro: Currently heavily sedated reported to be agitated but not sedated CV: s1s2 rrr, no m/r/g PULM: even/non-labored, lungs bilaterally diminished IR:JJOA, non-tender, bsx4 active, tube feedings are trickle Extremities: warm/dry, 1+ edema  Skin: no rashes or lesions     Resolved Hospital Problem list     Assessment & Plan:   Acute Metabolic Encephalopathy secondary to alcohol and polysubstance abuse  UDD + cocaine, THC Concerned for alcohol withdrawal seizure on 1/27 and second seizure like episode  1/30.  MRI brain unremarkable  PRN Ativan Wean  fentanyl drip Catapres patch Weaning Precedex Resumed psychotropics 12/28/2018.   Acute Hypoxic Respiratory Failure COPD exacerbation Has been treated for Klebsiella strep pneumonia Currently weaning 5/5 but agitation is a block to extubation.  He is either sedated or agitated Antibiotics have been completed Unfortunately he may need tracheostomy to be liberated from mechanical ventilatory support. 12/29/2019 diuresis x2 in attempt to optimize chance of extubation. 12/28/2018 we will de-escalate decreasing sedation and attempt to extubate him to avoid tracheostomy in the future.  Mild Hypernatremia:  Recent Labs  Lab 12/26/18 0427 12/27/18 0355 12/28/18 0418  NA 146* 145 145    Continue to monitor  Hx Diastolic HF Hx HTN Continue to monitor   Hx of bipolar disease. 12/28/2018 resumed valproic acid and other psychotropic medication  Best practice:  Diet: Tube feeds have resumed as of 12/27/2018 DVT prophylaxis: Lovenox  GI prophylaxis: Protonix Mobility: Bedrest Code Status: Full Family Communication: 2 12/28/2018 no family at bedside at time of examination e  App cct 30 min   Brett Canales Nayeliz Hipp ACNP Adolph Pollack PCCM Pager [phone] till 1 pm If no answer page [phone] 12/28/2018, 9:06 AM

## 2018-12-29 ENCOUNTER — Inpatient Hospital Stay (HOSPITAL_COMMUNITY): Payer: Medicare Other

## 2018-12-29 LAB — BASIC METABOLIC PANEL
Anion gap: 13 (ref 5–15)
Anion gap: 18 — ABNORMAL HIGH (ref 5–15)
BUN: 7 mg/dL (ref 6–20)
BUN: 7 mg/dL (ref 6–20)
CO2: 26 mmol/L (ref 22–32)
CO2: 25 mmol/L (ref 22–32)
Calcium: 8 mg/dL — ABNORMAL LOW (ref 8.9–10.3)
Calcium: 8.4 mg/dL — ABNORMAL LOW (ref 8.9–10.3)
Chloride: 104 mmol/L (ref 98–111)
Chloride: 100 mmol/L (ref 98–111)
Creatinine, Ser: 0.68 mg/dL (ref 0.61–1.24)
Creatinine, Ser: 0.74 mg/dL (ref 0.61–1.24)
GFR calc Af Amer: >60 mL/min (ref >60)
GFR calc Af Amer: >60 mL/min (ref >60)
GFR calc non Af Amer: >60 mL/min (ref >60)
GFR calc non Af Amer: >60 mL/min (ref >60)
Glucose, Bld: 94 mg/dL (ref 70–99)
Glucose, Bld: 89 mg/dL (ref 70–99)
Potassium: 3.3 mmol/L — ABNORMAL LOW (ref 3.5–5.1)
Potassium: 3.3 mmol/L — ABNORMAL LOW (ref 3.5–5.1)
Sodium: 143 mmol/L (ref 135–145)
Sodium: 143 mmol/L (ref 135–145)

## 2018-12-29 LAB — GLUCOSE, CAPILLARY
Glucose-Capillary: 75 mg/dL (ref 70–99)
Glucose-Capillary: 77 mg/dL (ref 70–99)
Glucose-Capillary: 78 mg/dL (ref 70–99)
Glucose-Capillary: 78 mg/dL (ref 70–99)
Glucose-Capillary: 85 mg/dL (ref 70–99)
Glucose-Capillary: 87 mg/dL (ref 70–99)

## 2018-12-29 LAB — CBC WITH DIFFERENTIAL/PLATELET
Abs Immature Granulocytes: 0.04 10*3/uL (ref 0.00–0.07)
Basophils Absolute: 0 10*3/uL (ref 0.0–0.1)
Basophils Relative: 0 %
Eosinophils Absolute: 0 10*3/uL (ref 0.0–0.5)
Eosinophils Relative: 0 %
HCT: 32.1 % — ABNORMAL LOW (ref 39.0–52.0)
Hemoglobin: 10.4 g/dL — ABNORMAL LOW (ref 13.0–17.0)
Immature Granulocytes: 0 %
Lymphocytes Relative: 5 %
Lymphs Abs: 0.5 10*3/uL — ABNORMAL LOW (ref 0.7–4.0)
MCH: 29 pg (ref 26.0–34.0)
MCHC: 32.4 g/dL (ref 30.0–36.0)
MCV: 89.4 fL (ref 80.0–100.0)
Monocytes Absolute: 0.5 10*3/uL (ref 0.1–1.0)
Monocytes Relative: 5 %
Neutro Abs: 9.9 10*3/uL — ABNORMAL HIGH (ref 1.7–7.7)
Neutrophils Relative %: 90 %
Platelets: 343 10*3/uL (ref 150–400)
RBC: 3.59 MIL/uL — ABNORMAL LOW (ref 4.22–5.81)
RDW: 12.1 % (ref 11.5–15.5)
WBC: 11 10*3/uL — ABNORMAL HIGH (ref 4.0–10.5)
nRBC: 0 % (ref 0.0–0.2)

## 2018-12-29 LAB — MAGNESIUM: Magnesium: 1.8 mg/dL (ref 1.7–2.4)

## 2018-12-29 LAB — PHOSPHORUS: Phosphorus: 3.3 mg/dL (ref 2.5–4.6)

## 2018-12-29 MED ORDER — POTASSIUM CHLORIDE 10 MEQ/100ML IV SOLN: 10.0000 meq | INTRAVENOUS | Status: DC

## 2018-12-29 MED ORDER — POTASSIUM CHLORIDE 10 MEQ/100ML IV SOLN
10.0000 meq | INTRAVENOUS | Status: AC
  Administered 2018-12-29 (×4): 10 meq via INTRAVENOUS
  Filled 2018-12-29 (×4): qty 100

## 2018-12-29 MED ORDER — FUROSEMIDE 10 MG/ML IJ SOLN
40.0000 mg | Freq: Two times a day (BID) | INTRAMUSCULAR | Status: AC
  Administered 2018-12-29 – 2018-12-30 (×2): 40 mg via INTRAVENOUS
  Filled 2018-12-29 (×2): qty 4

## 2018-12-29 MED ORDER — FUROSEMIDE 10 MG/ML IJ SOLN: 40.0000 mg | Freq: Two times a day (BID) | INTRAMUSCULAR | Status: DC

## 2018-12-29 MED ORDER — POTASSIUM CHLORIDE 10 MEQ/50ML IV SOLN: 10.0000 meq | INTRAVENOUS | Status: DC

## 2018-12-29 MED ORDER — POLYETHYLENE GLYCOL 3350 17 G PO PACK
17.0000 g | PACK | Freq: Two times a day (BID) | ORAL | Status: DC
  Filled 2018-12-29 (×3): qty 1

## 2018-12-29 MED ORDER — POTASSIUM CHLORIDE CRYS ER 20 MEQ PO TBCR: 20.0000 meq | EXTENDED_RELEASE_TABLET | ORAL | Status: DC

## 2018-12-29 NOTE — Progress Notes (Signed)
NAME:  Jeffrey [name], MRN:  [id number], DOB:  1961-09-08, LOS: 13 ADMISSION DATE:  12/16/2018,  REFERRING MD: [facility] emergency department physician, CHIEF COMPLAINT: Altered mental status, elevated ammonia level  Brief History   58 yo male smoker with altered mental status, agitation.  Intubated for airway protection.  Elevated ammonia level (274).  UDS positive for cocaine, THC.  Hx of ETOH, Bipolar, COPD.  Past Medical History  COPD with chronic tobacco abuse Bipolar Polysubstance abuse  Significant Hospital Events   12/16/2018 intubated 1/27 alcohol withdrawal seizure   Consults:    Procedures:  12/16/2018 intubation>> 1/25 Failed extubation, re-intubated   Significant Diagnostic Tests:  12/16/2018 CT head >> negative  Micro Data:  1/24 Resp Cx >> Klebsiella pneumoniae  1/24 Blood Cx >> Neg. Final 1/24 Urine Culture >> NG (final)  1/26 Resp Cx >> Strep Pneumoniae  1/31 Resp Cx >> Yeast.  Antimicrobials:  1/26 Ceftriaxone >> dc'd 1/27 1/27 Ancef >> dc'd 1/28 1/28 >>2/3 Ceftriaxone   Interim history/subjective:  Remains extubated over 24 hours Holding diuresis today as he diuresed 4 L on 12/28/2018 Objective   Blood pressure 138/72, pulse (!) 117, temperature 98.3 F (36.8 C), temperature source Oral, resp. rate (!) 21, height 5\' 6"  (1.676 m), weight 68.1 kg, SpO2 97 %.        Intake/Output Summary (Last 24 hours) at 12/29/2018 1009 Last data filed at 12/29/2018 0900 Gross per 24 hour  Intake 903.56 ml  Output 5225 ml  Net -4321.44 ml   Filed Weights   12/27/18 0500 12/28/18 0314 12/29/18 0155  Weight: 71.6 kg 73.7 kg 68.1 kg   Physical Exam: General: Somewhat stunned male in no acute distress remains extubated HEENT: No JVD or lymphadenopathy is appreciated t Neuro: Dull effect but able to answer questions follow commands CV: Sounds are regular PULM: even/non-labored, lungs bilaterally in the bases HQ:IONG, non-tender, bsx4 active  Extremities:  warm/dry, 1+ edema  Skin: no rashes or lesions      Resolved Hospital Problem list   Extubated 12/28/2018  Assessment & Plan:   Acute Metabolic Encephalopathy secondary to alcohol and polysubstance abuse  UDD + cocaine, THC Concerned for alcohol withdrawal seizure on 1/27 and second seizure like episode  1/30.  MRI brain unremarkable  PRN Ativan Catapres patch Changed to p.o. when swallowing evaluation is complete Continue folic acid and thiamine Substance abuse counseling in the future   Acute Hypoxic Respiratory Failure COPD exacerbation Is been treated for Klebsiella strep pneumonia He was liberated from medical mechanical ventilatory support on 12/28/2018 Wean O2 as tolerated Currently has completed antimicrobial therapy No further diuresis today 12/29/2018 Transfer to progressive care unit and to [facility] hospitalist service   Mild Hypernatremia:  Recent Labs  Lab 12/27/18 0355 12/28/18 0418 12/29/18 0322  NA 145 145 143   Recent Labs  Lab 12/27/18 0355 12/28/18 0418 12/29/18 0322  K 3.6 3.1* 3.3*    Continue to monitor  Hx Diastolic HF Hx HTN Continue to monitor. Replete potassium when he is diuresed Changed to p.o. Lasix once swallowing evaluation is been completed    Hx of bipolar disease. Continue valproic acid and psychotropic medication  Best practice:  Diet: Tube feeds have resumed as of 12/27/2018 DVT prophylaxis: Lovenox GI prophylaxis: Protonix Mobility: Bedrest Code Status: Full Family Communication: 2 12/28/2018 no family at bedside at time of examination e  App cct 30 min   Jeffrey [name] ACNP Adolph Pollack PCCM Pager [phone] till 1 pm  If no answer page [phone] 12/29/2018, 10:09 AM

## 2018-12-29 NOTE — Progress Notes (Signed)
eLink Physician-Brief Progress Note Patient Name: Jeffrey [name] DOB: July 04, 1961 MRN: [id number]   Date of Service  12/29/2018  HPI/Events of Note  K+ 3.3  eICU Interventions  Elink electrolyte replacement protocol for K+ of 3.3        [name] [name] 12/29/2018, 6:35 AM

## 2018-12-29 NOTE — Care Management Note (Signed)
Case Management Note  Patient Details  Name: Jeffrey [name] MRN: [id number] Date of Birth: January 03, 1961  Subjective/Objective:     Pt admitted with respiratory failure secondary to PNA               Action/Plan:   From home.     Expected Discharge Date:                  Expected Discharge Plan:     In-House Referral:  Clinical Social Work  Discharge planning Services  CM Consult  Post Acute Care Choice:    Choice offered to:     DME Arranged:    DME Agency:     HH Arranged:    HH Agency:     Status of Service:  In process, will continue to follow  If discussed at Long Length of Stay Meetings, dates discussed:    Additional Comments: 12/29/2018  Pt now extubated on room air however remains confused.  PT ordered.   CM spoke with pts daughter [name] ; pt was completely independent PTA.  If pt is able to discharge home daughter plans on moving pt in with him until he is ready to go back to his home.  CM will continue to follow  12/23/18 Remains on vent and now with ileus Cherylann Parr, RN 12/29/2018, 2:32 PM
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
## 2018-12-30 ENCOUNTER — Inpatient Hospital Stay (HOSPITAL_COMMUNITY): Payer: Medicare Other

## 2018-12-30 DIAGNOSIS — I5033 Acute on chronic diastolic (congestive) heart failure: Secondary | ICD-10-CM

## 2018-12-30 LAB — BASIC METABOLIC PANEL
Anion gap: 17 — ABNORMAL HIGH (ref 5–15)
BUN: 8 mg/dL (ref 6–20)
CO2: 27 mmol/L (ref 22–32)
Calcium: 8.4 mg/dL — ABNORMAL LOW (ref 8.9–10.3)
Chloride: 99 mmol/L (ref 98–111)
Creatinine, Ser: 0.73 mg/dL (ref 0.61–1.24)
GFR calc Af Amer: >60 mL/min (ref >60)
GFR calc non Af Amer: >60 mL/min (ref >60)
Glucose, Bld: 93 mg/dL (ref 70–99)
Potassium: 3.3 mmol/L — ABNORMAL LOW (ref 3.5–5.1)
Sodium: 143 mmol/L (ref 135–145)

## 2018-12-30 LAB — GLUCOSE, CAPILLARY
Glucose-Capillary: 104 mg/dL — ABNORMAL HIGH (ref 70–99)
Glucose-Capillary: 78 mg/dL (ref 70–99)
Glucose-Capillary: 81 mg/dL (ref 70–99)
Glucose-Capillary: 83 mg/dL (ref 70–99)
Glucose-Capillary: 85 mg/dL (ref 70–99)
Glucose-Capillary: 88 mg/dL (ref 70–99)

## 2018-12-30 LAB — PHOSPHORUS: Phosphorus: 2.5 mg/dL (ref 2.5–4.6)

## 2018-12-30 LAB — MAGNESIUM: Magnesium: 1.6 mg/dL — ABNORMAL LOW (ref 1.7–2.4)

## 2018-12-30 MED ORDER — IPRATROPIUM-ALBUTEROL 0.5-2.5 (3) MG/3ML IN SOLN
3.0000 mL | Freq: Two times a day (BID) | RESPIRATORY_TRACT | Status: DC
  Filled 2018-12-30: qty 3

## 2018-12-30 MED ORDER — IPRATROPIUM-ALBUTEROL 0.5-2.5 (3) MG/3ML IN SOLN: 3.0000 mL | Freq: Four times a day (QID) | RESPIRATORY_TRACT | Status: DC | PRN

## 2018-12-30 MED ORDER — LORAZEPAM 2 MG/ML IJ SOLN
0.0000 mg | Freq: Four times a day (QID) | INTRAMUSCULAR | Status: AC
  Administered 2018-12-30 – 2018-12-31 (×2): 2 mg via INTRAVENOUS
  Filled 2018-12-30 (×2): qty 1

## 2018-12-30 MED ORDER — LORAZEPAM 2 MG/ML IJ SOLN
0.0000 mg | Freq: Two times a day (BID) | INTRAMUSCULAR | Status: AC
  Administered 2019-01-01 – 2019-01-02 (×2): 2 mg via INTRAVENOUS
  Filled 2018-12-30 (×3): qty 1

## 2018-12-30 MED ORDER — POTASSIUM CHLORIDE 10 MEQ/100ML IV SOLN
10.0000 meq | INTRAVENOUS | Status: AC
  Administered 2018-12-30 (×4): 10 meq via INTRAVENOUS
  Filled 2018-12-30 (×4): qty 100

## 2018-12-30 NOTE — Progress Notes (Signed)
Speech Language Pathology Treatment: Dysphagia  Patient Details Name: Jeffrey [name] MRN: [id number] DOB: January 20, 1961 Today's Date: 12/30/2018 Time: 1200-1210 SLP Time Calculation (min) (ACUTE ONLY): 10 min  Assessment / Plan / Recommendation Clinical Impression  Pt remains poorly alert; however, level of arousal improved when sitting EOB.  Oral suctioning was set-up and oral care provided.  Pt continues with weak phonation s/p 12-day intubation.  Able to state name, DOB.  He accepted tspn boluses of water, ice chips with anterior spillage/delayed manipulation orally. Required continuous verbal prompting to remain awake and attentive. Swallow was palpable and no coughing was elicited, however pt's risk of silent aspiration due to prolonged intubation and glottal insensitivity is high.  Recommend continuing NPO until pt demonstrates improved and consistent LOA.  SLP will follow for PO readiness; if no substantial improvement in next 24 hours, pt may need temporary enteral feeding/cortrak.     HPI HPI: Pt is a 58 y.o. male with PMH significant for alcohol and polysubstance abuse, COPD, bipolar disorder, and chronic diastolic CHF, presented to ED with poor responsiveness, altered mental status and combativness. He reports to have been out drinking with friends. Pt has had recurring likely alcohol withdrawal seizures during admission. EEG on 1/31 revealed abnormal EEG secondary to general background slowing; finding may be seen with a diffuse disturbance that is etiologically nonspecific, but may include a metabolic encephalopathy or medication effect, among other possibilities. No epileptiform activity was noted.  CXR on 1/27 revealed worsening left greater than right lung opacities suggesting pneumonia. Intubated 1/24- 2/5.      SLP Plan  Continue with current plan of care       Recommendations  Diet recommendations: NPO                Oral Care Recommendations: Oral care QID Follow up  Recommendations: Other (comment)(tba) SLP Visit Diagnosis: Dysphagia, unspecified (R13.10) Plan: Continue with current plan of care       GO               Jeffrey [name] L. Jeffrey Frederic, MA CCC/SLP Acute Rehabilitation Services Office number [phone] Pager [phone]  Carolan Shiver 12/30/2018, 12:26 PM

## 2018-12-30 NOTE — Evaluation (Signed)
Occupational Therapy Evaluation Patient Details Name: Jeffrey [name] MRN: [id number] DOB: [birth year] Today's Date: 12/30/2018    History of Present Illness Pt is a 58 year old man admitted 12/16/18 with respiratory distress due to PNA. Intubated 1/24-2/5. Pt with ETOH withdrawal seizure on 1/27. +cocaine upon admission. PMH: bipolar, COPD, alcohol abuse, HTN, [name], depression, CHF.   Clinical Impression   Pt was living independently prior to admission. Presents with lethargy, significant weakness and impaired cognition. He requires total assist for ADL and 2 person assist for bed level mobility. He is unable to stand. Pt demonstrated improved trunk and head control as he remained seated at EOB. He attempted to verbalize intermittently, but was unintelligible. Will follow acutely.    Follow Up Recommendations  SNF;Supervision/Assistance - 24 hour    Equipment Recommendations  Other (comment)(defer to next venue)    Recommendations for Other Services       Precautions / Restrictions Precautions Precautions: Fall Restrictions Weight Bearing Restrictions: No      Mobility Bed Mobility Overal bed mobility: Needs Assistance Bed Mobility: Supine to Sit;Sit to Supine     Supine to sit: +2 for physical assistance;Total assist Sit to supine: +2 for physical assistance;Total assist   General bed mobility comments: assist for all aspects  Transfers Overall transfer level: Needs assistance Equipment used: 2 person hand held assist Transfers: Sit to/from Stand Sit to Stand: +2 physical assistance;Max assist         General transfer comment: attempted x 1 from EOB, pt unable to stand    Balance Overall balance assessment: Needs assistance Sitting-balance support: Bilateral upper extremity supported;Feet supported Sitting balance-Leahy Scale: Poor Sitting balance - Comments: initially requiring moderate assistance, progressed to min guard by end of session. Sat x 25 minutes while  ST addressed swallowing/mouth care.                                   ADL either performed or assessed with clinical judgement   ADL                                         General ADL Comments: requires total assist, brought R hand to mouth x 1 with washcloth     Vision   Additional Comments: vision appears grossly intact     Perception     Praxis      Pertinent Vitals/Pain Pain Assessment: Faces Faces Pain Scale: No hurt     Hand Dominance Right   Extremity/Trunk Assessment Upper Extremity Assessment Upper Extremity Assessment: Generalized weakness;RUE deficits/detail;LUE deficits/detail RUE Deficits / Details: can bring hand to face RUE Coordination: decreased fine motor;decreased gross motor LUE Coordination: decreased fine motor;decreased gross motor   Lower Extremity Assessment Lower Extremity Assessment: Defer to PT evaluation   Cervical / Trunk Assessment Cervical / Trunk Assessment: Other exceptions(weakness, flexed posture with forward head)   Communication Communication Communication: Expressive difficulties(minimal verbalization attempts)   Cognition Arousal/Alertness: Lethargic Behavior During Therapy: Flat affect Overall Cognitive Status: Impaired/Different from baseline Area of Impairment: Following commands;Safety/judgement                       Following Commands: Follows one step commands inconsistently Safety/Judgement: Decreased awareness of safety;Decreased awareness of deficits     General Comments: pt attempting to get OOB when  family is in the room, has a Health and safety inspector Comments       Exercises     Shoulder Instructions      Home Living Family/patient expects to be discharged to:: Private residence Living Arrangements: Alone                                      Prior Functioning/Environment Level of Independence: Independent        Comments: pt unable to  report home set up/equipment, no family available        OT Problem List: Decreased strength;Decreased activity tolerance;Impaired balance (sitting and/or standing);Decreased coordination;Decreased cognition;Decreased safety awareness;Decreased knowledge of use of DME or AE;Impaired UE functional use      OT Treatment/Interventions: Self-care/ADL training;DME and/or AE instruction;Cognitive remediation/compensation;Patient/family education;Balance training;Therapeutic activities;Neuromuscular education    OT Goals(Current goals can be found in the care plan section) Acute Rehab OT Goals OT Goal Formulation: Patient unable to participate in goal setting Time For Goal Achievement: 01/13/19 Potential to Achieve Goals: Fair ADL Goals Pt Will Perform Eating: with mod assist;sitting;with adaptive utensils(when cleared for PO) Pt Will Perform Grooming: with mod assist;sitting Pt Will Perform Upper Body Dressing: with mod assist;sitting Pt Will Transfer to Toilet: with +2 assist;with mod assist;bedside commode Additional ADL Goal #1: Pt will perform bed mobility with min assist in preparation for ADL. Additional ADL Goal #2: Pt will sit EOB x 10 minutes with supervision in preparation for ADL. Additional ADL Goal #3: Pt will follow one step commands with 50% accuracy.  OT Frequency: Min 2X/week   Barriers to D/C:            Co-evaluation PT/OT/SLP Co-Evaluation/Treatment: Yes Reason for Co-Treatment: For patient/therapist safety;Necessary to address cognition/behavior during functional activity   OT goals addressed during session: ADL's and self-care;Strengthening/ROM SLP goals addressed during session: Swallowing    AM-PAC OT "6 Clicks" Daily Activity     Outcome Measure Help from another person eating meals?: Total Help from another person taking care of personal grooming?: Total Help from another person toileting, which includes using toliet, bedpan, or urinal?: Total Help from  another person bathing (including washing, rinsing, drying)?: Total Help from another person to put on and taking off regular upper body clothing?: Total Help from another person to put on and taking off regular lower body clothing?: Total 6 Click Score: 6   End of Session Equipment Utilized During Treatment: Gait belt  Activity Tolerance: Patient limited by lethargy Patient left: in bed;with call bell/phone within reach;with bed alarm set;with nursing/sitter in room  OT Visit Diagnosis: Other symptoms and signs involving cognitive function;Muscle weakness (generalized) (M62.81)                Time: 4536-4680 OT Time Calculation (min): 32 min Charges:  OT General Charges $OT Visit: 1 Visit OT Evaluation $OT Eval Moderate Complexity: 1 Mod  Martie Round, OTR/L Acute Rehabilitation Services Pager: [phone] Office: [phone]  Evern Bio 12/30/2018, 2:13 PM

## 2018-12-30 NOTE — Progress Notes (Signed)
PROGRESS NOTE  Jeffrey [name]:[id number] DOB: 13-Oct-1961 DOA: 12/16/2018 PCP: Gareth Morgan, MD  HPI/Recap of past 20 hours: 58 year old male with history of bipolar disorder, COPD, diastolic heart failure as well as polysubstance abuse who presented with respiratory failure from Klebsiella pneumonia on 1/24 and was intubated.  Extubated on 2/5 with course complicated with an alcohol withdrawal seizure on 1/27 and then again on 1/30.  Patient has completed full course for Klebsiella pneumonia.  He still remains quite confused and agitated, trying to get out of bed.  Sitter started today.  Also started on scheduled Ativan CIWA protocol.  Unable to convey much although he denies being in pain   Principal Problem:   Acute toxic metabolic encephalopathy: Persisting.  Sitter started.  On scheduled Ativan now plus as needed, secondary to alcohol withdrawal.  MRI unrevealing. Active Problems:   COPD, severe (HCC): Breathing comfortably, no wheezing   Bipolar disorder with severe depression (HCC): Continue home medications   Alcohol use disorder, severe, dependence (HCC): On protocol.  Will counsel once he is more awake and alert  Acute on chronic diastolic CHF (congestive heart failure) [facility]):   Aggressively fluid resuscitated.  Now being diuresed.Diuresed several liters, still 2.3 L positive.    Acute hepatic encephalopathy: Ammonia level initially elevated, but has since come down.  Last check on 1/27.  Will repeat in the morning.   Respiratory failure (HCC)   Acute respiratory failure with hypoxemia (HCC): Secondary to pneumonia and COPD.  Stabilized, now normal.   Alcohol withdrawal syndrome, with delirium (HCC)   Code Status: Full code  Family Communication: Left message for family  Disposition Plan: Discharge home once much more awake, diuresed   Consultants:  Transferred from CCM  Procedures:  Intubated 1/24-2/5  Antimicrobials: 1/26 Ceftriaxone >> dc'd 1/27 1/27  Ancef >> dc'd 1/28 1/28 >>2/3 Ceftriaxone   DVT prophylaxis: Lovenox   Objective: Vitals:   12/30/18 0735 12/30/18 1143  BP: 135/70   Pulse: (!) 130 (!) 104  Resp:  17  Temp: 99.2 F (37.3 C)   SpO2: 90% 96%    Intake/Output Summary (Last 24 hours) at 12/30/2018 1634 Last data filed at 12/30/2018 1143 Gross per 24 hour  Intake 139.56 ml  Output 2350 ml  Net -2210.44 ml   Filed Weights   12/27/18 0500 12/28/18 0314 12/29/18 0155  Weight: 71.6 kg 73.7 kg 68.1 kg   Body mass index is 24.23 kg/m.  Exam:   General: Confused, does respond to name though  HEENT: Normocephalic atraumatic, mucous members are slightly dry.  Neck: Supple, no JVD  Cardiovascular: Regular rate and rhythm, S1-S2, 2 out of 6 systolic ejection murmur  Respiratory: Decreased breath sounds throughout, no wheezing  Abdomen: Soft, nontender, nondistended, positive bowel sounds  Musculoskeletal: No clubbing or cyanosis, trace pitting edema  Skin: No skin breaks, tears or lesions  Neuro: No focal deficits  Psychiatry: Confused   Data Reviewed: CBC: Recent Labs  Lab 12/24/18 0334 12/26/18 0427 12/27/18 0355 12/28/18 0418 12/29/18 0322  WBC 11.3* 6.7 6.6 5.6 11.0*  NEUTROABS  --   --   --  4.4 9.9*  HGB 10.7* 9.8* 9.7* 9.3* 10.4*  HCT 33.7* 31.5* 32.2* 29.3* 32.1*  MCV 94.4 93.8 96.1 94.2 89.4  PLT 274 253 252 221 343   Basic Metabolic Panel: Recent Labs  Lab 12/24/18 0334  12/27/18 0355 12/28/18 0418 12/29/18 0322 12/29/18 2003 12/30/18 0655  NA 143   < > 145 145 143 143  143  K 4.1   < > 3.6 3.1* 3.3* 3.3* 3.3*  CL 99   < > 111 112* 104 100 99  CO2 33*   < > 25 26 26 25 27   GLUCOSE 111*   < > 88 91 94 89 93  BUN 40*   < > 25* 16 7 7 8   CREATININE 0.95   < > 0.79 0.68 0.68 0.74 0.73  CALCIUM 8.7*   < > 7.8* 7.7* 8.0* 8.4* 8.4*  MG 2.1  --  2.0 1.9 1.8  --  1.6*  PHOS 4.1  --  3.6 2.9 3.3  --  2.5   < > = values in this interval not displayed.   GFR: Estimated  Creatinine Clearance: 90.8 mL/min (by C-G formula based on SCr of 0.73 mg/dL). Liver Function Tests: No results for input(s): AST, ALT, ALKPHOS, BILITOT, PROT, ALBUMIN in the last 168 hours. No results for input(s): LIPASE, AMYLASE in the last 168 hours. No results for input(s): AMMONIA in the last 168 hours. Coagulation Profile: No results for input(s): INR, PROTIME in the last 168 hours. Cardiac Enzymes: No results for input(s): CKTOTAL, CKMB, CKMBINDEX, TROPONINI in the last 168 hours. BNP (last 3 results) No results for input(s): PROBNP in the last 8760 hours. HbA1C: No results for input(s): HGBA1C in the last 72 hours. CBG: Recent Labs  Lab 12/29/18 2113 12/29/18 2359 12/30/18 0425 12/30/18 0733 12/30/18 1141  GLUCAP 85 83 85 78 88   Lipid Profile: No results for input(s): CHOL, HDL, LDLCALC, TRIG, CHOLHDL, LDLDIRECT in the last 72 hours. Thyroid Function Tests: No results for input(s): TSH, T4TOTAL, FREET4, T3FREE, THYROIDAB in the last 72 hours. Anemia Panel: No results for input(s): VITAMINB12, FOLATE, FERRITIN, TIBC, IRON, RETICCTPCT in the last 72 hours. Urine analysis:    Component Value Date/Time   COLORURINE YELLOW 12/16/2018 0315   APPEARANCEUR CLEAR 12/16/2018 0315   LABSPEC 1.014 12/16/2018 0315   PHURINE 8.0 12/16/2018 0315   GLUCOSEU NEGATIVE 12/16/2018 0315   HGBUR NEGATIVE 12/16/2018 0315   BILIRUBINUR NEGATIVE 12/16/2018 0315   KETONESUR NEGATIVE 12/16/2018 0315   PROTEINUR NEGATIVE 12/16/2018 0315   UROBILINOGEN 0.2 03/01/2011 1427   NITRITE NEGATIVE 12/16/2018 0315   LEUKOCYTESUR NEGATIVE 12/16/2018 0315   Sepsis Labs: @LABRCNTIP (procalcitonin:4,lacticidven:4)  ) Recent Results (from the past 240 hour(s))  Culture, respiratory (non-expectorated)     Status: None   Collection Time: 12/23/18  1:59 PM  Result Value Ref Range Status   Specimen Description TRACHEAL ASPIRATE  Final   Special Requests NONE  Final   Gram Stain   Final    MODERATE  WBC PRESENT, PREDOMINANTLY PMN MODERATE YEAST WITH PSEUDOHYPHAE    Culture   Final    FEW Consistent with normal respiratory flora. Performed at [facility], [address] [place], [place] [address]    Report Status 12/25/2018 FINAL  Final      Studies: Dg Chest Port 1 View  Result Date: 12/30/2018 CLINICAL DATA:  Respiratory failure EXAM: PORTABLE CHEST 1 VIEW COMPARISON:  12/29/2018 FINDINGS: Cardiac shadow is stable. Lungs are well aerated bilaterally. Increased vascular congestion and pulmonary edema is noted slightly improved when compare with the prior exam. Some posteriorly layering effusions may be present bilaterally. No bony abnormality is seen. IMPRESSION: Changes of CHF and pulmonary edema although mildly improved from the prior exam. Electronically Signed   By: Alcide CleverMark  Lukens M.D.   On: 12/30/2018 08:23    Scheduled Meds: . cloNIDine  0.3 mg Transdermal Weekly  . enoxaparin (LOVENOX) injection  40 mg Subcutaneous Q24H  . folic acid  1 mg Intravenous Daily  . insulin aspart  0-9 Units Subcutaneous Q4H  . ipratropium-albuterol  3 mL Nebulization BID  . LORazepam  0-4 mg Intravenous Q6H   Followed by  . [START ON 01/01/2019] LORazepam  0-4 mg Intravenous Q12H  . mouth rinse  15 mL Mouth Rinse BID  . pantoprazole (PROTONIX) IV  40 mg Intravenous Q24H  . polyethylene glycol  17 g Per Tube BID  . QUEtiapine  50 mg Per Tube BID  . senna-docusate  1 tablet Per Tube Daily  . sodium chloride flush  10-40 mL Intracatheter Q12H  . sodium chloride flush  3 mL Intravenous Q12H  . thiamine injection  100 mg Intravenous Daily  . valproic acid  500 mg Oral TID    Continuous Infusions: . sodium chloride 10 mL/hr at 12/29/18 1600  . sodium chloride Stopped (12/29/18 0107)     LOS: 14 days     Hollice Espy, MD [facility]  To reach me or the doctor on call, go to: [email or web address] Password [facility]  12/30/2018, 4:34 PM

## 2018-12-30 NOTE — Evaluation (Signed)
Physical Therapy Evaluation Patient Details Name: Jeffrey [name] MRN: [id number] DOB: 1961/01/26 Today's Date: 12/30/2018   History of Present Illness  Pt is a 58 year old man admitted 12/16/18 with respiratory distress due to PNA. Intubated 1/24-2/5. Pt with ETOH withdrawal seizure on 1/27. +cocaine upon admission. PMH: bipolar, COPD, alcohol abuse, HTN, [name], depression, CHF.  Clinical Impression  Pt admitted with above diagnosis. Pt currently with functional limitations due to the deficits listed below (see PT Problem List). Patient presenting with decreased functional mobility secondary to generalized weakness/debility, poor balance, cognitive deficits. Pt requiring total assist for bed mobility and able to progress to min guard for static sitting x 25 minutes. He is unable to stand at this time. Pt will benefit from skilled PT to increase their independence and safety with mobility to allow discharge to the venue listed below.       Follow Up Recommendations SNF    Equipment Recommendations  Other (comment)(TBD)    Recommendations for Other Services       Precautions / Restrictions Precautions Precautions: Fall Restrictions Weight Bearing Restrictions: No      Mobility  Bed Mobility Overal bed mobility: Needs Assistance Bed Mobility: Supine to Sit;Sit to Supine     Supine to sit: +2 for physical assistance;Total assist Sit to supine: +2 for physical assistance;Total assist   General bed mobility comments: assist for all aspects  Transfers Overall transfer level: Needs assistance Equipment used: 2 person hand held assist Transfers: Sit to/from Stand Sit to Stand: +2 physical assistance;Max assist         General transfer comment: attempted x 1 from EOB, pt unable to stand  Ambulation/Gait                Stairs            Wheelchair Mobility    Modified Rankin (Stroke Patients Only)       Balance Overall balance assessment: Needs  assistance Sitting-balance support: Bilateral upper extremity supported;Feet supported Sitting balance-Leahy Scale: Poor Sitting balance - Comments: initially requiring moderate assistance, progressed to min guard by end of session. Sat x 25 minutes while ST addressed swallowing/mouth care.                                     Pertinent Vitals/Pain Pain Assessment: Faces Faces Pain Scale: No hurt    Home Living Family/patient expects to be discharged to:: Private residence Living Arrangements: Alone                    Prior Function Level of Independence: Independent         Comments: pt unable to report home set up/equipment, no family available     Hand Dominance   Dominant Hand: Right    Extremity/Trunk Assessment   Upper Extremity Assessment Upper Extremity Assessment: Defer to OT evaluation RUE Deficits / Details: can bring hand to face RUE Coordination: decreased fine motor;decreased gross motor LUE Coordination: decreased fine motor;decreased gross motor    Lower Extremity Assessment Lower Extremity Assessment: RLE deficits/detail;LLE deficits/detail RLE Deficits / Details: Grossly 2/5 LLE Deficits / Details: Grossly 2/5    Cervical / Trunk Assessment Cervical / Trunk Assessment: Other exceptions(weakness, flexed posture with forward head)  Communication   Communication: Expressive difficulties(minimal verbalization attempts)  Cognition Arousal/Alertness: Lethargic Behavior During Therapy: Flat affect Overall Cognitive Status: Impaired/Different from baseline Area of Impairment: Following  commands;Safety/judgement                       Following Commands: Follows one step commands inconsistently Safety/Judgement: Decreased awareness of safety;Decreased awareness of deficits     General Comments: pt attempting to get OOB when family is in the room, has a Recruitment consultant. following simple commands < 25%      General  Comments      Exercises     Assessment/Plan    PT Assessment Patient needs continued PT services  PT Problem List Decreased strength;Decreased activity tolerance;Decreased balance;Decreased mobility;Decreased coordination;Decreased cognition;Decreased safety awareness       PT Treatment Interventions DME instruction;Gait training;Functional mobility training;Therapeutic activities;Therapeutic exercise;Neuromuscular re-education;Balance training;Cognitive remediation;Patient/family education    PT Goals (Current goals can be found in the Care Plan section)  Acute Rehab PT Goals Patient Stated Goal: none stated PT Goal Formulation: Patient unable to participate in goal setting Time For Goal Achievement: 01/13/19 Potential to Achieve Goals: Fair    Frequency Min 2X/week   Barriers to discharge        Co-evaluation PT/OT/SLP Co-Evaluation/Treatment: Yes Reason for Co-Treatment: Complexity of the patient's impairments (multi-system involvement);For patient/therapist safety;To address functional/ADL transfers PT goals addressed during session: Mobility/safety with mobility;Balance OT goals addressed during session: ADL's and self-care;Strengthening/ROM SLP goals addressed during session: Swallowing     AM-PAC PT "6 Clicks" Mobility  Outcome Measure Help needed turning from your back to your side while in a flat bed without using bedrails?: Total Help needed moving from lying on your back to sitting on the side of a flat bed without using bedrails?: Total Help needed moving to and from a bed to a chair (including a wheelchair)?: Total Help needed standing up from a chair using your arms (e.g., wheelchair or bedside chair)?: Total Help needed to walk in hospital room?: Total Help needed climbing 3-5 steps with a railing? : Total 6 Click Score: 6    End of Session   Activity Tolerance: Patient limited by fatigue Patient left: in bed;with call bell/phone within reach;with bed  alarm set;with nursing/sitter in room Nurse Communication: Mobility status PT Visit Diagnosis: Other abnormalities of gait and mobility (R26.89);Muscle weakness (generalized) (M62.81)    Time: 6568-1275 PT Time Calculation (min) (ACUTE ONLY): 31 min   Charges:   PT Evaluation $PT Eval Moderate Complexity: 1 Mod         Laurina Bustle, [place], DPT Acute Rehabilitation Services Pager [phone] Office [phone]   Vanetta Mulders 12/30/2018, 3:14 PM

## 2018-12-30 NOTE — Progress Notes (Signed)
Nutrition Follow-up  DOCUMENTATION CODES:   Not applicable  INTERVENTION:   -RD will follow for diet advancement and supplement diet as appropriate -If pt unable to take PO's, consider initiation of nutrition support: Initiate Osmolite 1.5 @ 25 ml/hr and increase by 10 ml every 4 hours to goal rate of 55 ml/hr.   30 ml Prostat BID.    Tube feeding regimen provides 2180 kcal (100% of needs), 113 grams of protein, and 1006 ml of H2O.   NUTRITION DIAGNOSIS:   Inadequate oral intake related to inability to eat as evidenced by NPO status.  Ongoing  GOAL:   Patient will meet greater than or equal to 90% of their needs  Unmet  MONITOR:   Diet advancement, Labs, Weight trends, Skin, I & O's  REASON FOR ASSESSMENT:   Consult Enteral/tube feeding initiation and management  ASSESSMENT:   58 yo male with PMH of [name], bipolar d/o, alcohol & marajuana abuse, [name], COPD, HTN who was admitted AMS, elevated ammonia level requiring intubation on admission.  2/5- extubated, s/p MBSS- recommend continued NPO  Reviewed I/O's: -3.5 L x 24 hours and +2.7 L since admission  Pt sitting in bed at time of visit and very confused.   Case discussed with RN, who reports that pt remains NPO. RN reports that she tried to administer PO meds this morning, however, pt spit them out. SLP saw this morning, however, she is unsure if they will re-evaluate today.   If pt continues to be unable to take PO's, may need to consider initiation of nutrition support. Pt has been without adequate nutrition over the past 5 days, as pt was receiving trickle feeds in ICU on 12/27/18.   Labs reviewed: K: 3.3, CBGS: 78-85 (inpatient orders for glycemic control are 0-9 units insulin aspart every 4 hours).   Diet Order:   Diet Order            Diet NPO time specified  Diet effective now              EDUCATION NEEDS:   Not appropriate for education at this time  Skin:  Skin Assessment: Reviewed RN  Assessment  Last BM:  12/29/18  Height:   Ht Readings from Last 1 Encounters:  12/18/18 5\' 6"  (1.676 m)    Weight:   Wt Readings from Last 1 Encounters:  12/29/18 68.1 kg    Ideal Body Weight:  64.5 kg  BMI:  Body mass index is 24.23 kg/m.  Estimated Nutritional Needs:   Kcal:  2000-2200  Protein:  105-120 grams  Fluid:  > 2.0 L    Ellianah Cordy A. Mayford Knife, RD, LDN, CDE Pager: [phone] After hours Pager: [phone]

## 2018-12-30 NOTE — Care Management Important Message (Signed)
Important Message  Patient Details  Name: Jeffrey [name] MRN: [id number] Date of Birth: 05-11-1961   Medicare Important Message Given:  Yes    Chayil Gantt P Keandre Linden 12/30/2018, 1:22 PM

## 2018-12-31 ENCOUNTER — Inpatient Hospital Stay (HOSPITAL_COMMUNITY): Payer: Medicare Other

## 2018-12-31 LAB — URINALYSIS, ROUTINE W REFLEX MICROSCOPIC
Glucose, UA: NEGATIVE mg/dL
Ketones, ur: 20 mg/dL — AB
Leukocytes, UA: NEGATIVE
Nitrite: NEGATIVE
Protein, ur: 100 mg/dL — AB
Specific Gravity, Urine: 1.026 (ref 1.005–1.030)
pH: 5 (ref 5.0–8.0)

## 2018-12-31 LAB — GLUCOSE, CAPILLARY
Glucose-Capillary: 75 mg/dL (ref 70–99)
Glucose-Capillary: 75 mg/dL (ref 70–99)
Glucose-Capillary: 79 mg/dL (ref 70–99)
Glucose-Capillary: 83 mg/dL (ref 70–99)
Glucose-Capillary: 86 mg/dL (ref 70–99)
Glucose-Capillary: 90 mg/dL (ref 70–99)
Glucose-Capillary: 93 mg/dL (ref 70–99)

## 2018-12-31 LAB — CBC
HCT: 31.2 % — ABNORMAL LOW (ref 39.0–52.0)
Hemoglobin: 9.8 g/dL — ABNORMAL LOW (ref 13.0–17.0)
MCH: 28.6 pg (ref 26.0–34.0)
MCHC: 31.4 g/dL (ref 30.0–36.0)
MCV: 91 fL (ref 80.0–100.0)
Platelets: 310 10*3/uL (ref 150–400)
RBC: 3.43 MIL/uL — ABNORMAL LOW (ref 4.22–5.81)
RDW: 12.6 % (ref 11.5–15.5)
WBC: 4.3 10*3/uL (ref 4.0–10.5)
nRBC: 0 % (ref 0.0–0.2)

## 2018-12-31 LAB — VALPROIC ACID LEVEL: Valproic Acid Lvl: 27 ug/mL — ABNORMAL LOW (ref 50.0–100.0)

## 2018-12-31 LAB — BASIC METABOLIC PANEL
Anion gap: 10 (ref 5–15)
BUN: 8 mg/dL (ref 6–20)
CO2: 32 mmol/L (ref 22–32)
Calcium: 8.1 mg/dL — ABNORMAL LOW (ref 8.9–10.3)
Chloride: 103 mmol/L (ref 98–111)
Creatinine, Ser: 0.65 mg/dL (ref 0.61–1.24)
GFR calc Af Amer: >60 mL/min (ref >60)
GFR calc non Af Amer: >60 mL/min (ref >60)
Glucose, Bld: 85 mg/dL (ref 70–99)
Potassium: 2.8 mmol/L — ABNORMAL LOW (ref 3.5–5.1)
Sodium: 145 mmol/L (ref 135–145)

## 2018-12-31 LAB — MAGNESIUM: Magnesium: 1.9 mg/dL (ref 1.7–2.4)

## 2018-12-31 LAB — AMMONIA: Ammonia: 49 umol/L — ABNORMAL HIGH (ref 9–35)

## 2018-12-31 MED ORDER — SODIUM CHLORIDE 0.9 % IV SOLN
INTRAVENOUS | Status: AC
  Administered 2019-01-01: 1000 mL via INTRAVENOUS

## 2018-12-31 MED ORDER — SODIUM CHLORIDE 0.9 % IV SOLN
Freq: Once | INTRAVENOUS | Status: AC
  Administered 2018-12-31: 17:00:00 via INTRAVENOUS

## 2018-12-31 MED ORDER — POTASSIUM CHLORIDE 10 MEQ/100ML IV SOLN
10.0000 meq | INTRAVENOUS | Status: AC
  Administered 2018-12-31 (×4): 10 meq via INTRAVENOUS
  Filled 2018-12-31 (×4): qty 100

## 2018-12-31 NOTE — Progress Notes (Signed)
Woke up on and off trying to get out of bed. repositioned to sides with pillow behind,back to sleep . Continue to monitor.

## 2018-12-31 NOTE — Progress Notes (Signed)
Flexiseal bag full, bag changed.

## 2018-12-31 NOTE — Significant Event (Addendum)
Rapid Response Event Note  Overview: Neurologic - Increasing Somnolence  Initial Focused Assessment: While I was rounding, RN asked me to assess patient for decreased LOC. Upon entering the room, patient was sleeping, woke to painful stimuli, earlier was agitated and required Ativan and now has a Recruitment consultant. Localizes to pain in all extremities, pupils 3 mm brisk/reactive/equal. + Gag. VSS.  Interventions: - None-   Plan of Care: - I asked RN to page primary service provider to see if other interventions are needed.   Event Summary:    at    [facility] Time 0135 End Time 0150   Jeffrey [name] R

## 2018-12-31 NOTE — Progress Notes (Signed)
Md notified.  Pt's rectal temp 102.2. Hr still 130's-140's pt back from Head CT.  Urine obtained.  Little output for today.  Pt awake mumbles, and tries to get OOB.  Sitter at bedside.  New orders received. Will continue to monitor. [name]

## 2018-12-31 NOTE — Progress Notes (Signed)
Md notified pt needed safety sitter orders.  New orders received. Will continue to monitor .Karena Addisonoro, Louellen Haldeman T

## 2018-12-31 NOTE — Progress Notes (Signed)
Notified Md. Clarification = No labs ordered this am. Yesterday potassium 3.3 and mag level 1.6. Clarified if wanted am labs. Will continue to monitor. Jeffrey [name]

## 2018-12-31 NOTE — Progress Notes (Signed)
MDmade aware about the 2 bags of flexiseal emptied with loose stool. No order  For c diff. Continue to monitor.

## 2018-12-31 NOTE — Progress Notes (Signed)
Became restless trying to get out of bed. Ativan 2 mg iv given.continue to monitor.

## 2018-12-31 NOTE — Progress Notes (Signed)
  Speech Language Pathology Treatment:    Patient Details Name: Jeffrey [name] MRN: [id number] DOB: 02-11-1961 Today's Date: 12/31/2018 Time: 6283-1517 SLP Time Calculation (min) (ACUTE ONLY): 10 min  Assessment / Plan / Recommendation Clinical Impression  Patient seen to assess readiness for PO intake. RN in room administering IV meds and patient was in bed, combative but not overly aggressive (swinging hands to attempt to hit RN). Patient followed only two commands to open mouth(which he did only slightly) and attempted to follow command to stick tongue out but was not able to move tongue adequately. Patient refused oral care and was becoming increasingly agitated with SLP during this brief session. SLP did not administer any PO's as patient was agitated and refusing to participate as well as lethargic with difficulty keeping eyes open.    HPI HPI: Pt is a 58 y.o. male with PMH significant for alcohol and polysubstance abuse, COPD, bipolar disorder, and chronic diastolic CHF, presented to ED with poor responsiveness, altered mental status and combativness. He reports to have been out drinking with friends. Pt has had recurring likely alcohol withdrawal seizures during admission. EEG on 1/31 revealed abnormal EEG secondary to general background slowing; finding may be seen with a diffuse disturbance that is etiologically nonspecific, but may include a metabolic encephalopathy or medication effect, among other possibilities. No epileptiform activity was noted.  CXR on 1/27 revealed worsening left greater than right lung opacities suggesting pneumonia. Intubated 1/24- 2/5.      SLP Plan  Continue with current plan of care       Recommendations  Diet recommendations: NPO Medication Administration: Via alternative means                Oral Care Recommendations: Oral care QID Follow up Recommendations: Other (comment)(TBD (SNF if cognition does not improve)) SLP Visit Diagnosis: Dysphagia,  unspecified (R13.10) Plan: Continue with current plan of care       GO               Angela Nevin, MA, CCC-SLP 12/31/18 2:56 PM

## 2018-12-31 NOTE — Progress Notes (Signed)
PROGRESS NOTE    Jeffrey [name]  ZOX:[id number] DOB: 06-Dec-1960 DOA: 12/16/2018 PCP: Gareth Morgan, MD  Brief Narrative: 58 year old male with history of bipolar disorder, COPD, diastolic heart failure as well as polysubstance abuse who presented with respiratory failure from Klebsiella pneumonia on 1/24 and was intubated.  Extubated on 2/5 with course complicated with an alcohol withdrawal seizure on 1/27 and then again on 1/30.  Patient has completed full course for Klebsiella pneumonia.  He still remains quite confused and agitated, trying to get out of bed.  Sitter started today.  Also started on scheduled Ativan CIWA protocol.  Unable to convey much although he denies being in pain   Assessment & Plan:   Principal Problem:   Acute encephalopathy Active Problems:   COPD, severe (HCC)   Bipolar disorder with severe depression (HCC)   Alcohol use disorder, severe, dependence (HCC)   Chronic diastolic CHF (congestive heart failure) (HCC)   Acute hepatic encephalopathy   Respiratory failure (HCC)   Acute respiratory failure with hypoxemia (HCC)   Alcohol withdrawal syndrome, with delirium (HCC)    Acute toxic metabolic encephalopathy: Persisting.  Sitter started.  Ativan was increased yesterday to scheduled as well as as needed.  Will DC the scheduled dose and continue as needed Ativan.  DC Seroquel hold Depakote check Depakote level ammonia level obtain CBC.  If no improvement in spite of holding Ativan and Seroquel will do a CT of the head.  Active Problems:   COPD, severe (HCC): Breathing comfortably, no wheezing.  Oxygen to keep sats above 90%.   Bipolar disorder with severe depression (HCC): Holding Seroquel restart when patient at baseline mental status.   Alcohol use disorder, severe, dependence (HCC): On protocol.  Will counsel once he is more awake and alert  Acute on chronic diastolic CHF (congestive heart failure) [facility]):   Aggressively fluid resuscitated.  Now being  diuresed.Diuresed several liters, still 2.3 L positive.    Acute hepatic encephalopathy: Ammonia level initially elevated, but has since come down.  Last check on 1/27.    New level ordered for today results pending.   Respiratory failure (HCC)   Acute respiratory failure with hypoxemia (HCC): Secondary to pneumonia and COPD.  Stabilized, now normal.   Alcohol withdrawal syndrome, with delirium (HCC)  Hypokalemia- replete.   Nutrition Problem: Inadequate oral intake Etiology: inability to eat     Signs/Symptoms: NPO status    Interventions: Refer to RD note for recommendations  Estimated body mass index is 22.99 kg/m as calculated from the following:   Height as of this encounter: 5\' 6"  (1.676 m).   Weight as of this encounter: 64.6 kg.  DVT prophylaxis Lovenox Code Status: Full code Family Communication: None Disposition Plan: Pending clinical improvement Consultants: Transfer from PCCM  Procedures: Patient was intubated January 24 through February 5 Antimicrobials:  Rocephin Ancef stopped Subjective: Having difficulty waking him up and not sure what his baseline is however he opens his eyes but does not follow commands he opens his eyes and then goes back to sleep. Objective: Vitals:   12/30/18 2200 12/30/18 2324 12/31/18 0310 12/31/18 0743  BP:  (!) 105/52 (!) 106/56 (!) 98/51  Pulse: (!) 112 (!) 116 (!) 105 (!) 112  Resp:  (!) 34 (!) 22 (!) 28  Temp:  98.4 F (36.9 C) 99.1 F (37.3 C) 98.5 F (36.9 C)  TempSrc:  Oral Axillary Axillary  SpO2:  96% 94% 94%  Weight:   64.6 kg   Height:  Intake/Output Summary (Last 24 hours) at 12/31/2018 0905 Last data filed at 12/31/2018 7564 Gross per 24 hour  Intake 200 ml  Output 601 ml  Net -401 ml   Filed Weights   12/28/18 0314 12/29/18 0155 12/31/18 0310  Weight: 73.7 kg 68.1 kg 64.6 kg    Examination: Sitting in bed appears to be sleeping does not follow commands when I do wake him up and called his name  he will open his eyes and then straight goes back to sleep.  General exam: Appears calm and comfortable  Respiratory system: Clear to auscultation. Respiratory effort normal. Cardiovascular system: S1 & S2 heard, RRR. No JVD, murmurs, rubs, gallops or clicks. No pedal edema. Gastrointestinal system: Abdomen is nondistended, soft and nontender. No organomegaly or masses felt. Normal bowel sounds heard. Extremities: No edema bilateral muscle atrophy noted Skin: No rashes, lesions or ulcers     Data Reviewed: I have personally reviewed following labs and imaging studies  CBC: Recent Labs  Lab 12/26/18 0427 12/27/18 0355 12/28/18 0418 12/29/18 0322 12/31/18 0757  WBC 6.7 6.6 5.6 11.0* 4.3  NEUTROABS  --   --  4.4 9.9*  --   HGB 9.8* 9.7* 9.3* 10.4* 9.8*  HCT 31.5* 32.2* 29.3* 32.1* 31.2*  MCV 93.8 96.1 94.2 89.4 91.0  PLT 253 252 221 343 310   Basic Metabolic Panel: Recent Labs  Lab 12/27/18 0355 12/28/18 0418 12/29/18 0322 12/29/18 2003 12/30/18 0655 12/31/18 0433  NA 145 145 143 143 143 145  K 3.6 3.1* 3.3* 3.3* 3.3* 2.8*  CL 111 112* 104 100 99 103  CO2 25 26 26 25 27  32  GLUCOSE 88 91 94 89 93 85  BUN 25* 16 7 7 8 8   CREATININE 0.79 0.68 0.68 0.74 0.73 0.65  CALCIUM 7.8* 7.7* 8.0* 8.4* 8.4* 8.1*  MG 2.0 1.9 1.8  --  1.6* 1.9  PHOS 3.6 2.9 3.3  --  2.5  --    GFR: Estimated Creatinine Clearance: 90.8 mL/min (by C-G formula based on SCr of 0.65 mg/dL). Liver Function Tests: No results for input(s): AST, ALT, ALKPHOS, BILITOT, PROT, ALBUMIN in the last 168 hours. No results for input(s): LIPASE, AMYLASE in the last 168 hours. No results for input(s): AMMONIA in the last 168 hours. Coagulation Profile: No results for input(s): INR, PROTIME in the last 168 hours. Cardiac Enzymes: No results for input(s): CKTOTAL, CKMB, CKMBINDEX, TROPONINI in the last 168 hours. BNP (last 3 results) No results for input(s): PROBNP in the last 8760 hours. HbA1C: No results for  input(s): HGBA1C in the last 72 hours. CBG: Recent Labs  Lab 12/30/18 1851 12/30/18 1946 12/30/18 2353 12/31/18 0324 12/31/18 0743  GLUCAP 81 104* 90 83 79   Lipid Profile: No results for input(s): CHOL, HDL, LDLCALC, TRIG, CHOLHDL, LDLDIRECT in the last 72 hours. Thyroid Function Tests: No results for input(s): TSH, T4TOTAL, FREET4, T3FREE, THYROIDAB in the last 72 hours. Anemia Panel: No results for input(s): VITAMINB12, FOLATE, FERRITIN, TIBC, IRON, RETICCTPCT in the last 72 hours. Sepsis Labs: No results for input(s): PROCALCITON, LATICACIDVEN in the last 168 hours.  Recent Results (from the past 240 hour(s))  Culture, respiratory (non-expectorated)     Status: None   Collection Time: 12/23/18  1:59 PM  Result Value Ref Range Status   Specimen Description TRACHEAL ASPIRATE  Final   Special Requests NONE  Final   Gram Stain   Final    MODERATE WBC PRESENT, PREDOMINANTLY PMN MODERATE YEAST WITH  PSEUDOHYPHAE    Culture   Final    FEW Consistent with normal respiratory flora. Performed at [facility], [address], [place], [place] [address]    Report Status 12/25/2018 FINAL  Final         Radiology Studies: Dg Chest Port 1 View  Result Date: 12/30/2018 CLINICAL DATA:  Respiratory failure EXAM: PORTABLE CHEST 1 VIEW COMPARISON:  12/29/2018 FINDINGS: Cardiac shadow is stable. Lungs are well aerated bilaterally. Increased vascular congestion and pulmonary edema is noted slightly improved when compare with the prior exam. Some posteriorly layering effusions may be present bilaterally. No bony abnormality is seen. IMPRESSION: Changes of CHF and pulmonary edema although mildly improved from the prior exam. Electronically Signed   By: Alcide Clever M.D.   On: 12/30/2018 08:23        Scheduled Meds: . cloNIDine  0.3 mg Transdermal Weekly  . enoxaparin (LOVENOX) injection  40 mg Subcutaneous Q24H  . folic acid  1 mg Intravenous Daily  . insulin aspart  0-9 Units  Subcutaneous Q4H  . LORazepam  0-4 mg Intravenous Q6H   Followed by  . [START ON 01/01/2019] LORazepam  0-4 mg Intravenous Q12H  . mouth rinse  15 mL Mouth Rinse BID  . pantoprazole (PROTONIX) IV  40 mg Intravenous Q24H  . polyethylene glycol  17 g Per Tube BID  . senna-docusate  1 tablet Per Tube Daily  . sodium chloride flush  10-40 mL Intracatheter Q12H  . sodium chloride flush  3 mL Intravenous Q12H  . thiamine injection  100 mg Intravenous Daily  . valproic acid  500 mg Oral TID   Continuous Infusions: . sodium chloride 10 mL/hr at 12/29/18 1600  . sodium chloride Stopped (12/29/18 0107)  . potassium chloride 10 mEq (12/31/18 0812)     LOS: 15 days     Alwyn Ren, MD [facility]  If 7PM-7AM, please contact night-coverage [email or web address] Password [email or web address] 12/31/2018, 9:05 AM

## 2018-12-31 NOTE — Progress Notes (Signed)
With frequent loose stool, md aware, flexiseal applied , tolerated well.

## 2018-12-31 NOTE — Progress Notes (Signed)
Pt not voided since foley discontinued.  Bladder scanned pt  + aprox 330. I&O cath pt. Sterile tech.  Pt tolerated well .  Replaced condom cath.  Will continue to monitor. [name]

## 2018-12-31 NOTE — Progress Notes (Signed)
Md notified of pt's potassium level 2.8.  Pt can not take po.  Awaiting orders. [name]

## 2018-12-31 NOTE — Progress Notes (Signed)
Heart rate had been 140's to 150's sinus tach.  despite ativan given at 3pm, MD aware with order to start ivf ns .

## 2018-12-31 NOTE — Progress Notes (Signed)
Pt warm to touch, temp 101.9A  Rechecked 101.0 Tylenol given rectal. Removed blankets from bed. Pt thrashing in bed,  Ativan given pre CT scan. Foley for condom cath empied 175 output noted.  Since last documented around 11 am no output documented.  Bladder scanned to confirm if correct.  Bladder scanned 0-6 cc.  Swat RN Called to transport pt to CT.  Will continue to monitor Jeffrey [name] T

## 2018-12-31 NOTE — Progress Notes (Signed)
Notified Md to make aware of pt becoming less responsive.  Ativan was given at beginning of shift.  Rapid response  assessed pt.  Responds to pain.  Pupils react.  Does not follow commands.  Vs stable.  Will continue to monitor Jeffrey [name]

## 2019-01-01 LAB — BASIC METABOLIC PANEL
Anion gap: 20 — ABNORMAL HIGH (ref 5–15)
Anion gap: 16 — ABNORMAL HIGH (ref 5–15)
BUN: 20 mg/dL (ref 6–20)
BUN: 18 mg/dL (ref 6–20)
CO2: 20 mmol/L — ABNORMAL LOW (ref 22–32)
CO2: 22 mmol/L (ref 22–32)
Calcium: 8.3 mg/dL — ABNORMAL LOW (ref 8.9–10.3)
Calcium: 8.4 mg/dL — ABNORMAL LOW (ref 8.9–10.3)
Chloride: 106 mmol/L (ref 98–111)
Chloride: 108 mmol/L (ref 98–111)
Creatinine, Ser: 0.99 mg/dL (ref 0.61–1.24)
Creatinine, Ser: 0.76 mg/dL (ref 0.61–1.24)
GFR calc Af Amer: >60 mL/min (ref >60)
GFR calc Af Amer: >60 mL/min (ref >60)
GFR calc non Af Amer: >60 mL/min (ref >60)
GFR calc non Af Amer: >60 mL/min (ref >60)
Glucose, Bld: 97 mg/dL (ref 70–99)
Glucose, Bld: 104 mg/dL — ABNORMAL HIGH (ref 70–99)
Potassium: 2.9 mmol/L — ABNORMAL LOW (ref 3.5–5.1)
Potassium: 2.5 mmol/L — CL (ref 3.5–5.1)
Sodium: 146 mmol/L — ABNORMAL HIGH (ref 135–145)
Sodium: 146 mmol/L — ABNORMAL HIGH (ref 135–145)

## 2019-01-01 LAB — GLUCOSE, CAPILLARY
Glucose-Capillary: 91 mg/dL (ref 70–99)
Glucose-Capillary: 80 mg/dL (ref 70–99)
Glucose-Capillary: 103 mg/dL — ABNORMAL HIGH (ref 70–99)
Glucose-Capillary: 113 mg/dL — ABNORMAL HIGH (ref 70–99)
Glucose-Capillary: 104 mg/dL — ABNORMAL HIGH (ref 70–99)
Glucose-Capillary: 94 mg/dL (ref 70–99)

## 2019-01-01 LAB — CBC
HCT: 36.6 % — ABNORMAL LOW (ref 39.0–52.0)
Hemoglobin: 12.1 g/dL — ABNORMAL LOW (ref 13.0–17.0)
MCH: 30 pg (ref 26.0–34.0)
MCHC: 33.1 g/dL (ref 30.0–36.0)
MCV: 90.8 fL (ref 80.0–100.0)
Platelets: 362 10*3/uL (ref 150–400)
RBC: 4.03 MIL/uL — ABNORMAL LOW (ref 4.22–5.81)
RDW: 13 % (ref 11.5–15.5)
WBC: 4.2 10*3/uL (ref 4.0–10.5)
nRBC: 0 % (ref 0.0–0.2)

## 2019-01-01 LAB — C DIFFICILE QUICK SCREEN W PCR REFLEX
C Diff antigen: NEGATIVE
C Diff interpretation: NOT DETECTED
C Diff toxin: NEGATIVE

## 2019-01-01 MED ORDER — POTASSIUM CHLORIDE 10 MEQ/100ML IV SOLN
10.0000 meq | INTRAVENOUS | Status: AC
  Administered 2019-01-01 (×3): 10 meq via INTRAVENOUS
  Filled 2019-01-01 (×3): qty 100

## 2019-01-01 MED ORDER — KCL IN DEXTROSE-NACL 40-5-0.9 MEQ/L-%-% IV SOLN
INTRAVENOUS | Status: AC
  Administered 2019-01-01 – 2019-01-02 (×3): via INTRAVENOUS
  Filled 2019-01-01 (×4): qty 1000

## 2019-01-01 MED ORDER — MAGNESIUM SULFATE 2 GM/50ML IV SOLN
INTRAVENOUS | Status: AC
  Filled 2019-01-01: qty 50

## 2019-01-01 MED ORDER — POTASSIUM CHLORIDE 10 MEQ/100ML IV SOLN
10.0000 meq | INTRAVENOUS | Status: AC
  Administered 2019-01-01 (×6): 10 meq via INTRAVENOUS
  Filled 2019-01-01 (×6): qty 100

## 2019-01-01 MED ORDER — METOPROLOL TARTRATE 5 MG/5ML IV SOLN: 5.0000 mg | Freq: Four times a day (QID) | INTRAVENOUS | Status: DC | PRN

## 2019-01-01 MED ORDER — MAGNESIUM SULFATE 4 GM/100ML IV SOLN
4.0000 g | Freq: Once | INTRAVENOUS | Status: AC
  Administered 2019-01-01: 4 g via INTRAVENOUS
  Filled 2019-01-01 (×2): qty 100

## 2019-01-01 MED ORDER — POTASSIUM CHLORIDE CRYS ER 20 MEQ PO TBCR
40.0000 meq | EXTENDED_RELEASE_TABLET | Freq: Once | ORAL | Status: AC
  Administered 2019-01-02: 40 meq via ORAL
  Filled 2019-01-01: qty 2

## 2019-01-01 MED ORDER — HYDROMORPHONE HCL 1 MG/ML IJ SOLN
0.5000 mg | INTRAMUSCULAR | Status: DC | PRN
  Administered 2019-01-01 – 2019-01-04 (×7): 0.5 mg via INTRAVENOUS
  Filled 2019-01-01 (×7): qty 0.5

## 2019-01-01 NOTE — Progress Notes (Signed)
Speech Language Pathology Treatment:    Patient Details Name: Jeffrey [name] MRN: [id number] DOB: 1961/02/23 Today's Date: 01/01/2019 Time: 1350-1400 SLP Time Calculation (min) (ACUTE ONLY): 10 min  Assessment / Plan / Recommendation Clinical Impression  Pt was seen for swallowing re-assessment. His level of alertness was improved and adequate for p.o. intake. A clear liquid diet has been ordered but pt's nurse and nurse tech/sitter reported that the pt has not consumed anything for the day. Despite max verbal and tactile cues to open his mouth and accept trials, pt refused and did not accept trials. Pt demonstrated pursed lips, head shaking, and pushing the SLP's hands away with presentation of all boluses. The safety of p.o. intake at this time therefore cannot be determined by SLP but considering his mentation and prolonged intubation, transient dysphagia is suspected. An NPO status is therefore recommended until the pt is able to participate in assessment.    HPI HPI: Pt is a 58 y.o. male with PMH significant for alcohol and polysubstance abuse, COPD, bipolar disorder, and chronic diastolic CHF, presented to ED with poor responsiveness, altered mental status and combativness. He reports to have been out drinking with friends. Pt has had recurring likely alcohol withdrawal seizures during admission. EEG on 1/31 revealed abnormal EEG secondary to general background slowing; finding may be seen with a diffuse disturbance that is etiologically nonspecific, but may include a metabolic encephalopathy or medication effect, among other possibilities. No epileptiform activity was noted.  CXR on 1/27 revealed worsening left greater than right lung opacities suggesting pneumonia. Intubated 1/24- 2/5.      SLP Plan  Continue with current plan of care       Recommendations  Diet recommendations: NPO Medication Administration: Via alternative means                Oral Care Recommendations: Oral  care QID Follow up Recommendations: Skilled Nursing facility SLP Visit Diagnosis: Dysphagia, unspecified (R13.10) Plan: Continue with current plan of care       Francie Keeling I. Vear Clock, MS, CCC-SLP Acute Rehabilitation Services Office number [phone] Pager [phone]              [name] 01/01/2019, 2:20 PM

## 2019-01-01 NOTE — Progress Notes (Signed)
Re-paged Md for potassium of 2.9. Will continue to monitor. Karena Addisonoro, Rami Budhu T

## 2019-01-01 NOTE — Progress Notes (Addendum)
While doing mouth care on pt., asked pt" Jeffrey [name]" to rub lips together after applying moisturizer pt did so and smiled.  Pt had a productive cough with large amount of phlegm suction/yaunker out. Pt  reached for sitter's  hand and mumbled/whispered " thank you".  Pt confused still. Tossing in bed.  Sitter at bedside.  Pt more awake during this shift in comparison to previous night.  Even with the 2mg  of ativan given earlier prior to CT of head.  Will continue to monitor. [name]

## 2019-01-01 NOTE — Progress Notes (Signed)
Latest potassium level-2.5, md  aware with potassium runs ordered.

## 2019-01-01 NOTE — Progress Notes (Signed)
No urine output since this morning, bladder scan done- . In and out done tol. Well obtained 200 ml dark amber urine

## 2019-01-01 NOTE — Progress Notes (Signed)
Md made aware of potassium of 2.9  Hr 124 bp 111/66 temp 99.4 A.   Will continue to monitor. [name]

## 2019-01-01 NOTE — Progress Notes (Signed)
PROGRESS NOTE    Jeffrey [name]  UXL:[id number] DOB: July 07, 1961 DOA: 12/16/2018 PCP: Gareth Morgan, MD   Brief Narrative:58 year old male with history of bipolar disorder, COPD, diastolic heart failure as well as polysubstance abuse who presented with respiratory failure from Klebsiella pneumonia on 1/24 and was intubated. Extubated on 2/5 with course complicated with an alcohol withdrawal seizure on 1/27 and then again on 1/30. Patient has completed full course for Klebsiella pneumonia.He still remains quite confused and agitated, trying to get out of bed. Sitter started today. Also started on scheduled Ativan CIWA protocol. Unable to convey much although he denies being in pain  Assessment & Plan:   Principal Problem:   Acute encephalopathy Active Problems:   COPD, severe (HCC)   Bipolar disorder with severe depression (HCC)   Alcohol use disorder, severe, dependence (HCC)   Chronic diastolic CHF (congestive heart failure) (HCC)   Acute hepatic encephalopathy   Respiratory failure (HCC)   Acute respiratory failure with hypoxemia (HCC)   Alcohol withdrawal syndrome, with delirium (HCC)  Acute toxic metabolicencephalopathy: much improved today 2/9.awake starting talk to family again.  He received Ativan overnight.  CT of the head that was done last night shows no acute changes.  Restart Seroquel if needed.  Ammonia level 48 12/31/2018.  This patient spiked fever last night urine culture blood cultures were sent out.  Patient continues to have diarrhea C. difficile PCR sent out today.   Active Problems: COPD, severe (HCC): Breathing comfortably, no wheezing.  Oxygen to keep sats above 90%.   Bipolar disorder with severe depression (HCC): Holding Seroquel restart when patient at baseline mental status.  Alcohol use disorder, severe, dependence (HCC):On protocol. Will counsel once he is more awake and alert  Acute on chronic diastolic CHF (congestive heart failure)  (HCC):Aggressively fluid resuscitated.  Negative by 2.4 L since admission.  Not on any diuretics at this time as he is not eating or drinking and hypoglycemic.  He was started on D5 normal saline 01/01/2019.  Yesterday he received normal saline 1 bag.    Respiratory failure (HCC) Acute respiratory failure with hypoxemia (HCC): Secondary to pneumonia and COPD. Stabilized, now normal.  Alcohol withdrawal syndrome, with delirium (HCC)  Hypokalemia- replete.  Replete potassium and magnesium.      Nutrition Problem: Inadequate oral intake Etiology: inability to eat     Signs/Symptoms: NPO status    Interventions: Refer to RD note for recommendations  Estimated body mass index is 21.39 kg/m as calculated from the following:   Height as of this encounter: 5\' 6"  (1.676 m).   Weight as of this encounter: 60.1 kg.  DVT prophylaxis: Lovenox Code Status: Full code Family Communication: Discussed with [name] his daughter who is a [employer] Disposition Plan:  Will need placement rehabilitation will need PT consult patient lives alone prior to admission Consultants:  PCCM Procedures: None Antimicrobials: None Subjective: Patient awake this morning eyes open resting in bed trying to talk took a sip of water while I was in the room.  Objective: Vitals:   01/01/19 0155 01/01/19 0336 01/01/19 0504 01/01/19 0830  BP: 111/66 108/68    Pulse: (!) 125 (!) 121 (!) 122 (!) 116  Resp: (!) 30 (!) 23 (!) 25   Temp:  98 F (36.7 C)  97.8 F (36.6 C)  TempSrc:  Axillary  Oral  SpO2: 96% 96% 97% 97%  Weight:   60.1 kg   Height:        Intake/Output Summary (Last 24  hours) at 01/01/2019 1023 Last data filed at 01/01/2019 0502 Gross per 24 hour  Intake 1479.77 ml  Output 2850 ml  Net -1370.23 ml   Filed Weights   12/29/18 0155 12/31/18 0310 01/01/19 0504  Weight: 68.1 kg 64.6 kg 60.1 kg    Examination:  General exam: Appears calm and comfortable  Respiratory system:  Scattered rhonchi to auscultation. Respiratory effort normal. Cardiovascular system: S1 & S2 heard, RRR. No JVD, murmurs, rubs, gallops or clicks. No pedal edema. Gastrointestinal system: Abdomen is nondistended, soft and nontender. No organomegaly or masses felt. Normal bowel sounds heard. Central nervous system awake moves all the extremities Extremities: Symmetric 5 x 5 power. Skin: No rashes, lesions or ulcers    Data Reviewed: I have personally reviewed following labs and imaging studies  CBC: Recent Labs  Lab 12/27/18 0355 12/28/18 0418 12/29/18 0322 12/31/18 0757 01/01/19 0033  WBC 6.6 5.6 11.0* 4.3 4.2  NEUTROABS  --  4.4 9.9*  --   --   HGB 9.7* 9.3* 10.4* 9.8* 12.1*  HCT 32.2* 29.3* 32.1* 31.2* 36.6*  MCV 96.1 94.2 89.4 91.0 90.8  PLT 252 221 343 310 362   Basic Metabolic Panel: Recent Labs  Lab 12/27/18 0355 12/28/18 0418 12/29/18 0322 12/29/18 2003 12/30/18 0655 12/31/18 0433 01/01/19 0033  NA 145 145 143 143 143 145 146*  K 3.6 3.1* 3.3* 3.3* 3.3* 2.8* 2.9*  CL 111 112* 104 100 99 103 106  CO2 25 26 26 25 27  32 20*  GLUCOSE 88 91 94 89 93 85 97  BUN 25* 16 7 7 8 8 20   CREATININE 0.79 0.68 0.68 0.74 0.73 0.65 0.99  CALCIUM 7.8* 7.7* 8.0* 8.4* 8.4* 8.1* 8.3*  MG 2.0 1.9 1.8  --  1.6* 1.9  --   PHOS 3.6 2.9 3.3  --  2.5  --   --    GFR: Estimated Creatinine Clearance: 69.1 mL/min (by C-G formula based on SCr of 0.99 mg/dL). Liver Function Tests: No results for input(s): AST, ALT, ALKPHOS, BILITOT, PROT, ALBUMIN in the last 168 hours. No results for input(s): LIPASE, AMYLASE in the last 168 hours. Recent Labs  Lab 12/31/18 0757  AMMONIA 49*   Coagulation Profile: No results for input(s): INR, PROTIME in the last 168 hours. Cardiac Enzymes: No results for input(s): CKTOTAL, CKMB, CKMBINDEX, TROPONINI in the last 168 hours. BNP (last 3 results) No results for input(s): PROBNP in the last 8760 hours. HbA1C: No results for input(s): HGBA1C in the  last 72 hours. CBG: Recent Labs  Lab 12/31/18 1716 12/31/18 1930 12/31/18 2334 01/01/19 0342 01/01/19 0754  GLUCAP 93 75 86 91 80   Lipid Profile: No results for input(s): CHOL, HDL, LDLCALC, TRIG, CHOLHDL, LDLDIRECT in the last 72 hours. Thyroid Function Tests: No results for input(s): TSH, T4TOTAL, FREET4, T3FREE, THYROIDAB in the last 72 hours. Anemia Panel: No results for input(s): VITAMINB12, FOLATE, FERRITIN, TIBC, IRON, RETICCTPCT in the last 72 hours. Sepsis Labs: No results for input(s): PROCALCITON, LATICACIDVEN in the last 168 hours.  Recent Results (from the past 240 hour(s))  Culture, respiratory (non-expectorated)     Status: None   Collection Time: 12/23/18  1:59 PM  Result Value Ref Range Status   Specimen Description TRACHEAL ASPIRATE  Final   Special Requests NONE  Final   Gram Stain   Final    MODERATE WBC PRESENT, PREDOMINANTLY PMN MODERATE YEAST WITH PSEUDOHYPHAE    Culture   Final    FEW Consistent  with normal respiratory flora. Performed at [facility], [address], [place], [place] [address]    Report Status 12/25/2018 FINAL  Final         Radiology Studies: Dg Chest 1 View  Result Date: 12/31/2018 CLINICAL DATA:  58 year old male with history of hypoxia. Productive cough. EXAM: CHEST  1 VIEW COMPARISON:  Chest x-ray 12/30/2018. FINDINGS: Diffuse peribronchial cuffing and patchy interstitial prominence, most evident throughout the mid to lower lungs bilaterally, slightly improved compared to the prior study. No confluent consolidative airspace disease. No pleural effusions. No evidence of pulmonary edema. Heart size is normal. The patient is rotated to the right on today's exam, resulting in distortion of the mediastinal contours and reduced diagnostic sensitivity and specificity for mediastinal pathology. IMPRESSION: 1. Diffuse peribronchial cuffing and interstitial prominence concerning for severe bronchitis and probable multilobar  bronchopneumonia, slightly improved compared to the prior study. Electronically Signed   By: Trudie Reed M.D.   On: 12/31/2018 20:30   Ct Head Wo Contrast  Result Date: 12/31/2018 CLINICAL DATA:  Encephalopathy. No reported injury. EXAM: CT HEAD WITHOUT CONTRAST TECHNIQUE: Contiguous axial images were obtained from the base of the skull through the vertex without intravenous contrast. COMPARISON:  12/16/2018 head CT. FINDINGS: Brain: No evidence of parenchymal hemorrhage or extra-axial fluid collection. No mass lesion, mass effect, or midline shift. No CT evidence of acute infarction. Stable small perivesical spaces in the right basal ganglia. Cerebral volume is age appropriate. No ventriculomegaly. Vascular: No acute abnormality. Skull: No evidence of calvarial fracture. Sinuses/Orbits: No fluid levels. Mild mucoperiosteal thickening throughout the paranasal sinuses. Other:  The mastoid air cells are unopacified. IMPRESSION: 1. No evidence of acute intracranial abnormality. 2. Mild paranasal sinusitis of uncertain acuity. Electronically Signed   By: Delbert Phenix M.D.   On: 12/31/2018 22:53        Scheduled Meds: . cloNIDine  0.3 mg Transdermal Weekly  . enoxaparin (LOVENOX) injection  40 mg Subcutaneous Q24H  . folic acid  1 mg Intravenous Daily  . insulin aspart  0-9 Units Subcutaneous Q4H  . LORazepam  0-4 mg Intravenous Q12H  . mouth rinse  15 mL Mouth Rinse BID  . pantoprazole (PROTONIX) IV  40 mg Intravenous Q24H  . polyethylene glycol  17 g Per Tube BID  . potassium chloride  40 mEq Oral Once  . senna-docusate  1 tablet Per Tube Daily  . sodium chloride flush  10-40 mL Intracatheter Q12H  . sodium chloride flush  3 mL Intravenous Q12H  . thiamine injection  100 mg Intravenous Daily  . valproic acid  500 mg Oral TID   Continuous Infusions: . sodium chloride 10 mL/hr at 12/29/18 1600  . sodium chloride Stopped (12/29/18 0107)  . dextrose 5 % and 0.9 % NaCl with KCl 40 mEq/L      . magnesium sulfate 1 - 4 g bolus IVPB       LOS: 16 days    Alwyn Ren, MD [facility]  If 7PM-7AM, please contact night-coverage [email or web address] Password [facility] 01/01/2019, 10:23 AM

## 2019-01-02 LAB — CBC WITH DIFFERENTIAL/PLATELET
Abs Immature Granulocytes: 0.02 10*3/uL (ref 0.00–0.07)
Basophils Absolute: 0 10*3/uL (ref 0.0–0.1)
Basophils Relative: 1 %
Eosinophils Absolute: 0.1 10*3/uL (ref 0.0–0.5)
Eosinophils Relative: 3 %
HCT: 36 % — ABNORMAL LOW (ref 39.0–52.0)
Hemoglobin: 11.1 g/dL — ABNORMAL LOW (ref 13.0–17.0)
Immature Granulocytes: 1 %
Lymphocytes Relative: 11 %
Lymphs Abs: 0.5 10*3/uL — ABNORMAL LOW (ref 0.7–4.0)
MCH: 28.4 pg (ref 26.0–34.0)
MCHC: 30.8 g/dL (ref 30.0–36.0)
MCV: 92.1 fL (ref 80.0–100.0)
Monocytes Absolute: 0.5 10*3/uL (ref 0.1–1.0)
Monocytes Relative: 11 %
Neutro Abs: 3.1 10*3/uL (ref 1.7–7.7)
Neutrophils Relative %: 73 %
Platelets: 329 10*3/uL (ref 150–400)
RBC: 3.91 MIL/uL — ABNORMAL LOW (ref 4.22–5.81)
RDW: 12.6 % (ref 11.5–15.5)
WBC: 4.2 10*3/uL (ref 4.0–10.5)
nRBC: 0 % (ref 0.0–0.2)

## 2019-01-02 LAB — BLOOD CULTURE ID PANEL (REFLEXED)

## 2019-01-02 LAB — COMPREHENSIVE METABOLIC PANEL
ALT: 22 U/L (ref 0–44)
AST: 18 U/L (ref 15–41)
Albumin: 2.1 g/dL — ABNORMAL LOW (ref 3.5–5.0)
Alkaline Phosphatase: 135 U/L — ABNORMAL HIGH (ref 38–126)
Anion gap: 13 (ref 5–15)
BUN: 13 mg/dL (ref 6–20)
CO2: 22 mmol/L (ref 22–32)
Calcium: 8.2 mg/dL — ABNORMAL LOW (ref 8.9–10.3)
Chloride: 117 mmol/L — ABNORMAL HIGH (ref 98–111)
Creatinine, Ser: 0.61 mg/dL (ref 0.61–1.24)
GFR calc Af Amer: >60 mL/min (ref >60)
GFR calc non Af Amer: >60 mL/min (ref >60)
Glucose, Bld: 130 mg/dL — ABNORMAL HIGH (ref 70–99)
Potassium: 3.4 mmol/L — ABNORMAL LOW (ref 3.5–5.1)
Sodium: 152 mmol/L — ABNORMAL HIGH (ref 135–145)
Total Bilirubin: 0.5 mg/dL (ref 0.3–1.2)
Total Protein: 5.7 g/dL — ABNORMAL LOW (ref 6.5–8.1)

## 2019-01-02 LAB — GLUCOSE, CAPILLARY
Glucose-Capillary: 108 mg/dL — ABNORMAL HIGH (ref 70–99)
Glucose-Capillary: 116 mg/dL — ABNORMAL HIGH (ref 70–99)
Glucose-Capillary: 97 mg/dL (ref 70–99)
Glucose-Capillary: 104 mg/dL — ABNORMAL HIGH (ref 70–99)
Glucose-Capillary: 102 mg/dL — ABNORMAL HIGH (ref 70–99)

## 2019-01-02 LAB — MAGNESIUM: Magnesium: 2.2 mg/dL (ref 1.7–2.4)

## 2019-01-02 MED ORDER — POTASSIUM CL IN DEXTROSE 5% 20 MEQ/L IV SOLN
20.0000 meq | INTRAVENOUS | Status: DC
  Administered 2019-01-03 – 2019-01-04 (×2): 20 meq via INTRAVENOUS
  Filled 2019-01-02 (×3): qty 1000

## 2019-01-02 MED ORDER — VALPROATE SODIUM 500 MG/5ML IV SOLN
500.0000 mg | Freq: Three times a day (TID) | INTRAVENOUS | Status: DC
  Administered 2019-01-02 – 2019-01-04 (×6): 500 mg via INTRAVENOUS
  Filled 2019-01-02 (×8): qty 5

## 2019-01-02 MED ORDER — ALBUTEROL SULFATE (2.5 MG/3ML) 0.083% IN NEBU: 2.5000 mg | INHALATION_SOLUTION | RESPIRATORY_TRACT | Status: DC | PRN

## 2019-01-02 MED ORDER — KCL IN DEXTROSE-NACL 40-5-0.9 MEQ/L-%-% IV SOLN: INTRAVENOUS | Status: DC

## 2019-01-02 NOTE — Clinical Social Work Placement (Signed)
   CLINICAL SOCIAL WORK PLACEMENT  NOTE  Date:  01/02/2019  Patient Details  Name: Jeffrey [name] MRN: [id number] Date of Birth: [birth year]  Clinical Social Work is seeking post-discharge placement for this patient at the Skilled  Nursing Facility level of care (*CSW will initial, date and re-position this form in  chart as items are completed):      Patient/family provided with [facility] Clinical Social Work Department's list of facilities offering this level of care within the geographic area requested by the patient (or if unable, by the patient's family).      Patient/family informed of their freedom to choose among providers that offer the needed level of care, that participate in Medicare, Medicaid or managed care program needed by the patient, have an available bed and are willing to accept the patient.      Patient/family informed of 's ownership interest in [facility] and [facility], as well as of the fact that they are under no obligation to receive care at these facilities.  PASRR submitted to EDS on 01/02/19     PASRR number received on       Existing PASRR number confirmed on       FL2 transmitted to all facilities in geographic area requested by pt/family on 01/02/19     FL2 transmitted to all facilities within larger geographic area on       Patient informed that his/her managed care company has contracts with or will negotiate with certain facilities, including the following:            Patient/family informed of bed offers received.  Patient chooses bed at       Physician recommends and patient chooses bed at      Patient to be transferred to   on  .  Patient to be transferred to facility by       Patient family notified on   of transfer.  Name of family member notified:        PHYSICIAN Please sign FL2     Additional Comment:    _______________________________________________ Margarito Liner, LCSW 01/02/2019, 1:36 PM

## 2019-01-02 NOTE — Progress Notes (Signed)
Speech Language Pathology Treatment: Dysphagia  Patient Details Name: Jeffrey [name] MRN: [id number] DOB: 1960-12-05 Today's Date: 01/02/2019 Time: 4098-1191 SLP Time Calculation (min) (ACUTE ONLY): 19 min  Assessment / Plan / Recommendation Clinical Impression  Pt did not accept any POs offered this morning, declining all consistencies offered by pursing his lips, turning his head, or pushing the spoon away. This occurred even if pt was verbally making requests for POs. Of note, his voice remains hoarse and low in intensity. Education was provided to daughter and her fiance about current level of function and risk associated with altered mentation, prolonged intubation. Will continue to follow for readiness to start PO diet.    HPI HPI: Pt is a 58 y.o. male with PMH significant for alcohol and polysubstance abuse, COPD, bipolar disorder, and chronic diastolic CHF, presented to ED with poor responsiveness, altered mental status and combativness. He reports to have been out drinking with friends. Pt has had recurring likely alcohol withdrawal seizures during admission. EEG on 1/31 revealed abnormal EEG secondary to general background slowing; finding may be seen with a diffuse disturbance that is etiologically nonspecific, but may include a metabolic encephalopathy or medication effect, among other possibilities. No epileptiform activity was noted.  CXR on 1/27 revealed worsening left greater than right lung opacities suggesting pneumonia. Intubated 1/24- 2/5.      SLP Plan  Continue with current plan of care       Recommendations  Diet recommendations: NPO Medication Administration: Via alternative means                Oral Care Recommendations: Oral care QID Follow up Recommendations: Skilled Nursing facility SLP Visit Diagnosis: Dysphagia, unspecified (R13.10) Plan: Continue with current plan of care       GO                Jeffrey [name] 01/02/2019, 10:10 AM  Natalia Leatherwood, M.A. CCC-SLP Acute Herbalist [phone] Office [phone]

## 2019-01-02 NOTE — Progress Notes (Signed)
Patient to be transferred to 3W26.  Report called to on-coming RN and daughter, [name], notified of room change.

## 2019-01-02 NOTE — Progress Notes (Signed)
PHARMACY - PHYSICIAN COMMUNICATION CRITICAL VALUE ALERT - BLOOD CULTURE IDENTIFICATION (BCID)  Jeffrey [name] is an 58 y.o. male who presented to [facility] on 12/16/2018 with a chief complaint of AMS  Assessment:  Pt has been here since 1/24. Lab called with BCID results today of 1/4 bottles with staph species (mecA+). Likely to be contaminant.   Name of physician (or Provider) Contacted: Dr. Sharon Seller  Current antibiotics: None  Changes to prescribed antibiotics recommended:  None  Results for orders placed or performed during the hospital encounter of 12/16/18  Blood Culture ID Panel (Reflexed) (Collected: 01/01/2019 12:33 AM)  Result Value Ref Range   Enterococcus species NOT DETECTED NOT DETECTED   Listeria monocytogenes NOT DETECTED NOT DETECTED   Staphylococcus species DETECTED (A) NOT DETECTED   Staphylococcus aureus (BCID) NOT DETECTED NOT DETECTED   Methicillin resistance DETECTED (A) NOT DETECTED   Streptococcus species NOT DETECTED NOT DETECTED   Streptococcus agalactiae NOT DETECTED NOT DETECTED   Streptococcus pneumoniae NOT DETECTED NOT DETECTED   Streptococcus pyogenes NOT DETECTED NOT DETECTED   Acinetobacter baumannii NOT DETECTED NOT DETECTED   Enterobacteriaceae species NOT DETECTED NOT DETECTED   Enterobacter cloacae complex NOT DETECTED NOT DETECTED   Escherichia coli NOT DETECTED NOT DETECTED   Klebsiella oxytoca NOT DETECTED NOT DETECTED   Klebsiella pneumoniae NOT DETECTED NOT DETECTED   Proteus species NOT DETECTED NOT DETECTED   Serratia marcescens NOT DETECTED NOT DETECTED   Haemophilus influenzae NOT DETECTED NOT DETECTED   Neisseria meningitidis NOT DETECTED NOT DETECTED   Pseudomonas aeruginosa NOT DETECTED NOT DETECTED   Candida albicans NOT DETECTED NOT DETECTED   Candida glabrata NOT DETECTED NOT DETECTED   Candida krusei NOT DETECTED NOT DETECTED   Candida parapsilosis NOT DETECTED NOT DETECTED   Candida tropicalis NOT DETECTED NOT DETECTED    Ulyses Southward, PharmD, BCIDP, AAHIVP, CPP Infectious Disease Pharmacist 01/02/2019 9:02 AM

## 2019-01-02 NOTE — Progress Notes (Signed)
Physical Therapy Treatment Patient Details Name: Jeffrey [name] MRN: [id number] DOB: September 19, 1961 Today's Date: 01/02/2019    History of Present Illness Pt is a 58 year old man admitted 12/16/18 with respiratory distress due to PNA. Intubated 1/24-2/5. Pt with ETOH withdrawal seizure on 1/27. +cocaine upon admission. PMH: bipolar, COPD, alcohol abuse, HTN, [name], depression, CHF.    PT Comments    Pt had received pain meds before session and was lethargic and not communicative or following commands. Tot A for supine to sit, worked on abdominal facilitation and getting him to engage in his environment in sitting. Max A +2 for return to supine with pt assisting to get LE's back into bed. Family present for session and very involved, may be helpful to see how he responds when daughter is not there. PT will continue to follow.    Follow Up Recommendations  SNF     Equipment Recommendations  Other (comment)(TBD)    Recommendations for Other Services       Precautions / Restrictions Precautions Precautions: Fall Restrictions Weight Bearing Restrictions: No    Mobility  Bed Mobility Overal bed mobility: Needs Assistance Bed Mobility: Supine to Sit;Sit to Supine     Supine to sit: +2 for physical assistance;Total assist Sit to supine: +2 for physical assistance;Max assist   General bed mobility comments: tot A for supine to sit but pt did assist in scooting fwd and righting himself once up. Assisted getting LE's back into bed with sit to supine.   Transfers Overall transfer level: Needs assistance Equipment used: 2 person hand held assist Transfers: Sit to/from Stand Sit to Stand: Total assist;+2 physical assistance         General transfer comment: attempted sit<>stand 2x from bed and pt initiated at first but then began to resist each time.   Ambulation/Gait                 Stairs             Wheelchair Mobility    Modified Rankin (Stroke Patients Only)       Balance Overall balance assessment: Needs assistance Sitting-balance support: Bilateral upper extremity supported;Feet supported Sitting balance-Leahy Scale: Poor Sitting balance - Comments: sat EOB x12 mins with min A, worked on trunk activation with facilitated posterior lean and pt pulling back fwd to elbows on knees. Attempted to have pt reach for objects and engage in conversation with family but he would not.                                     Cognition Arousal/Alertness: Lethargic;Suspect due to medications Behavior During Therapy: Flat affect Overall Cognitive Status: Impaired/Different from baseline Area of Impairment: Following commands;Safety/judgement                       Following Commands: Follows one step commands inconsistently Safety/Judgement: Decreased awareness of safety;Decreased awareness of deficits     General Comments: pt not following commands, often resisting tactile cues, daughter and her boyfriend present for session and he was not responding to them either      Exercises      General Comments General comments (skin integrity, edema, etc.): O2 sats 93% on RA      Pertinent Vitals/Pain Pain Assessment: Faces Faces Pain Scale: Hurts little more Pain Location: back Pain Descriptors / Indicators: Grimacing Pain Intervention(s): Limited activity within patient's  tolerance;Monitored during session;Premedicated before session    Home Living                      Prior Function            PT Goals (current goals can now be found in the care plan section) Acute Rehab PT Goals Patient Stated Goal: none stated PT Goal Formulation: Patient unable to participate in goal setting Time For Goal Achievement: 01/13/19 Potential to Achieve Goals: Fair Progress towards PT goals: Not progressing toward goals - comment(not following commands)    Frequency    Min 2X/week      PT Plan Current plan remains  appropriate    Co-evaluation              AM-PAC PT "6 Clicks" Mobility   Outcome Measure  Help needed turning from your back to your side while in a flat bed without using bedrails?: Total Help needed moving from lying on your back to sitting on the side of a flat bed without using bedrails?: Total Help needed moving to and from a bed to a chair (including a wheelchair)?: Total Help needed standing up from a chair using your arms (e.g., wheelchair or bedside chair)?: Total Help needed to walk in hospital room?: Total Help needed climbing 3-5 steps with a railing? : Total 6 Click Score: 6    End of Session Equipment Utilized During Treatment: Oxygen Activity Tolerance: Other (comment)(limited by cognition) Patient left: in bed;with call bell/phone within reach;with family/visitor present Nurse Communication: Mobility status PT Visit Diagnosis: Other abnormalities of gait and mobility (R26.89);Muscle weakness (generalized) (M62.81)     Time: 4098-1191 PT Time Calculation (min) (ACUTE ONLY): 19 min  Charges:  $Neuromuscular Re-education: 8-22 mins                     Lyanne Co, PT  Acute Rehab Services  Pager [phone] Office [phone]    Lawana Chambers Wrigley Winborne 01/02/2019, 1:00 PM

## 2019-01-02 NOTE — [place] FL2 (Signed)
[place] MEDICAID FL2 LEVEL OF CARE SCREENING TOOL     IDENTIFICATION  Patient Name: Jeffrey [name] Birthdate: [birth year] Sex: male Admission Date (Current Location): 12/16/2018  [facility] [place]  [place]   Facility and Address:  [facility], [address], [place], [place] [address]      Provider Number: [id number]  Attending Physician Name and Address:  Lonia BloodMcClung, Jeffrey T, MD  Relative Name and Phone Number:       Current Level of Care: Hospital Recommended Level of Care: Skilled Nursing Facility Prior Approval Number:    Date Approved/Denied:   PASRR Number: Manual review  Discharge Plan: SNF    Current Diagnoses: Patient Active Problem List   Diagnosis Date Noted  . Acute encephalopathy 12/16/2018  . Acute hepatic encephalopathy 12/16/2018  . Respiratory failure (HCC) 12/16/2018  . Hepatic encephalopathy (HCC)   . Acute respiratory failure with hypoxemia (HCC)   . Alcohol withdrawal syndrome, with delirium (HCC)   . COPD exacerbation (HCC) 09/16/2018  . Essential hypertension 09/16/2018  . GERD (gastroesophageal reflux disease) 09/16/2018  . Hypoxia   . Chronic diastolic CHF (congestive heart failure) (HCC)   . Bipolar disorder with severe depression (HCC) 02/01/2018  . Alcohol use disorder, severe, dependence (HCC) 02/01/2018  . Cannabis use disorder, severe, dependence (HCC) 02/01/2018  . MDD (major depressive disorder), recurrent episode, severe (HCC) 01/31/2018  . Internal and external bleeding hemorrhoids   . Rectal bleeding   . Iron deficiency anemia due to chronic blood loss 03/09/2017  . Anemia   . GIB (gastrointestinal bleeding) 02/07/2017  . Acute blood loss anemia 02/07/2017  . Weakness generalized 02/07/2017  . Dizziness 02/07/2017  . Peptic ulcer disease   . Bipolar disorder (HCC)   . 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 Dyspnea 08/26/2016  . H/O: depression 08/26/2016  . Dyslipidemia 08/26/2016  .  Tobacco use disorder 08/26/2016  . [name] (peripheral vascular disease) (HCC) 06/18/2015  . Chronic diarrhea 05/02/2013  . Hematochezia 05/02/2013  . Abnormal weight loss 05/02/2013  . Abdominal pain, periumbilical 05/02/2013    Orientation RESPIRATION BLADDER Height & Weight     Self  O2(Nasal Canula 2 L) Incontinent Weight: 134 lb 4.2 oz (60.9 kg) Height:  5\' 6"  (167.6 cm)(measured at bedside)  BEHAVIORAL SYMPTOMS/MOOD NEUROLOGICAL BOWEL NUTRITION STATUS  Other (Comment)(Uncooperative, anxious, irritable, restless.) (None) Incontinent(Flexiseal) Diet(Currently NPO. See diet on discharge summary when available. )  AMBULATORY STATUS COMMUNICATION OF NEEDS Skin   Total Care Verbally Skin abrasions, Bruising, Other (Comment)(Amputation, Exoriated, Weeping. Skin tear on right posterior elbow: Foam every 3 days.)                       Personal Care Assistance Level of Assistance  Total care       Total Care Assistance: Maximum assistance   Functional Limitations Info  Sight, Hearing, Speech Sight Info: Adequate Hearing Info: Adequate Speech Info: Adequate(Delayed responses.)    SPECIAL CARE FACTORS FREQUENCY  PT (By licensed PT), OT (By licensed OT), Speech therapy     PT Frequency: 5 x week OT Frequency: 5 x week     Speech Therapy Frequency: 5 x week      Contractures Contractures Info: Not present    Additional Factors Info  Code Status, Allergies, Psychotropic Code Status Info: Full code Allergies Info: Augmentin (Amoxicillin-pot Clavulanate), Ace Inhibitors. Psychotropic Info: Depakene solution 500 mg PO TID.         Current  Medications (01/02/2019):  This is the current hospital active medication list Current Facility-Administered Medications  Medication Dose Route Frequency Provider Last Rate Last Dose  . acetaminophen (TYLENOL) tablet 650 mg  650 mg Per Tube Q6H PRN Minor, Vilinda BlanksWilliam S, NP   650 mg at 12/25/18 1028   Or  . acetaminophen (TYLENOL)  suppository 650 mg  650 mg Rectal Q6H PRN Minor, Vilinda BlanksWilliam S, NP   650 mg at 12/31/18 2156  . albuterol (PROVENTIL) (2.5 MG/3ML) 0.083% nebulizer solution 2.5 mg  2.5 mg Nebulization Q4H PRN Minor, Vilinda BlanksWilliam S, NP   2.5 mg at 12/18/18 0550  . bisacodyl (DULCOLAX) suppository 10 mg  10 mg Rectal Daily PRN Minor, Vilinda BlanksWilliam S, NP      . cloNIDine (CATAPRES - Dosed in mg/24 hr) patch 0.3 mg  0.3 mg Transdermal Weekly Minor, Vilinda BlanksWilliam S, NP      . dextrose 5 % with KCl 20 mEq / L  infusion  20 mEq Intravenous Continuous Lonia BloodMcClung, Jeffrey T, MD      . enoxaparin (LOVENOX) injection 40 mg  40 mg Subcutaneous Q24H Minor, Vilinda BlanksWilliam S, NP   40 mg at 01/02/19 0745  . folic acid injection 1 mg  1 mg Intravenous Daily Minor, Vilinda BlanksWilliam S, NP   1 mg at 01/02/19 1304  . HYDROmorphone (DILAUDID) injection 0.5 mg  0.5 mg Intravenous Q4H PRN Alwyn RenMathews, Elizabeth G, MD   0.5 mg at 01/02/19 1003  . insulin aspart (novoLOG) injection 0-9 Units  0-9 Units Subcutaneous Q4H Minor, Vilinda BlanksWilliam S, NP   Stopped at 12/25/18 1230  . ipratropium-albuterol (DUONEB) 0.5-2.5 (3) MG/3ML nebulizer solution 3 mL  3 mL Nebulization Q6H PRN Hollice EspyKrishnan, Sendil K, MD      . LORazepam (ATIVAN) injection 0-4 mg  0-4 mg Intravenous Q12H Hollice EspyKrishnan, Sendil K, MD   2 mg at 01/01/19 2138  . LORazepam (ATIVAN) injection 1-4 mg  1-4 mg Intravenous Q4H PRN Minor, Vilinda BlanksWilliam S, NP   1 mg at 01/02/19 0915  . MEDLINE mouth rinse  15 mL Mouth Rinse BID Minor, Vilinda BlanksWilliam S, NP   15 mL at 01/02/19 1155  . metoprolol tartrate (LOPRESSOR) injection 5 mg  5 mg Intravenous Q6H PRN Alwyn RenMathews, Elizabeth G, MD      . ondansetron [facility](ZOFRAN) injection 4 mg  4 mg Intravenous Q6H PRN Minor, Vilinda BlanksWilliam S, NP   4 mg at 12/23/18 1342  . pantoprazole (PROTONIX) injection 40 mg  40 mg Intravenous Q24H Minor, Vilinda BlanksWilliam S, NP   40 mg at 01/02/19 0912  . polyethylene glycol (MIRALAX / GLYCOLAX) packet 17 g  17 g Per Tube BID Minor, Vilinda BlanksWilliam S, NP      . senna-docusate (Senokot-S) tablet 1 tablet  1 tablet Per  Tube Daily Minor, Vilinda BlanksWilliam S, NP   1 tablet at 12/29/18 [phone]  . sodium chloride flush (NS) 0.9 % injection 10-40 mL  10-40 mL Intracatheter Q12H Minor, Vilinda BlanksWilliam S, NP   10 mL at 01/02/19 0924  . sodium chloride flush (NS) 0.9 % injection 10-40 mL  10-40 mL Intracatheter PRN Minor, Vilinda BlanksWilliam S, NP   10 mL at 12/30/18 1604  . sodium chloride flush (NS) 0.9 % injection 3 mL  3 mL Intravenous Q12H Minor, Vilinda BlanksWilliam S, NP   3 mL at 01/02/19 0925  . thiamine (B-1) injection 100 mg  100 mg Intravenous Daily Minor, Vilinda BlanksWilliam S, NP   100 mg at 01/02/19 0912  . valproic acid (DEPAKENE) solution 500 mg  500 mg Oral TID Minor,  Vilinda Blanks, NP   500 mg at 12/30/18 2210     Discharge Medications: Please see discharge summary for a list of discharge medications.  Relevant Imaging Results:  Relevant Lab Results:   Additional Information SS#: [id number]. IV Ativan will be discontinued tomorrow (CIWA protocol).  Margarito Liner, LCSW

## 2019-01-02 NOTE — Clinical Social Work Note (Signed)
Clinical Social Work Assessment  Patient Details  Name: Jeffrey [name] MRN: [id number] Date of Birth: Jun 03, 1961  Date of referral:  01/02/19               Reason for consult:  Facility Placement, Discharge Planning                Permission sought to share information with:  Facility Medical sales representative, Family Supports Permission granted to share information::     Name::     [name]  Agency::  SNF's  Relationship::  Daughter  Contact Information:  [phone]  Housing/Transportation Living arrangements for the past 2 months:  Single Family Home Source of Information:  Medical Team, Adult Children Patient Interpreter Needed:  None Criminal Activity/Legal Involvement Pertinent to Current Situation/Hospitalization:  No - Comment as needed Significant Relationships:  Adult Children, Parents Lives with:  Self Do you feel safe going back to the place where you live?  Yes Need for family participation in patient care:  Yes (Comment)  Care giving concerns:  PT recommending SNF once medically stable for discharge.   Social Worker assessment / plan:  Patient only oriented to self. Asleep during assessment. Daughter at bedside. CSW introduced role and explained that PT recommendations would be discussed. [name] agreeable to SNF placement. Plan is for patient to discharge to daughter's home after rehab. Provided list of CMS Medicare scores for facilities within 25 miles of his zip code. PASARR under manual review. Patient cannot go to SNF until PASARR obtained unless he goes [place] a [place] SNF. No further concerns. CSW encouraged patient's daughter to contact CSW as needed. CSW will continue to follow patient and his daughter for support and facilitate discharge to SNF once medically stable.  Employment status:  Disabled (Comment on whether or not currently receiving Disability) Insurance information:  Managed Medicare PT Recommendations:  Skilled Nursing  Facility Information / Referral to community resources:  Skilled Nursing Facility  Patient/Family's Response to care:  Patient not fully oriented or awake. [name] agreeable to SNF placement. [name] supportive and involved in patient's care. [name] appreciated social work intervention.  Patient/Family's Understanding of and Emotional Response to Diagnosis, Current Treatment, and Prognosis:  Patient not fully oriented or awake. [name] has a good understanding of the reason for admission and his need for rehab prior to him going home with her. [name] appears happy with hospital care.  Emotional Assessment Appearance:  Appears stated age Attitude/Demeanor/Rapport:  Unable to Assess Affect (typically observed):  Unable to Assess Orientation:  Oriented to Self Alcohol / Substance use:  Tobacco Use, Alcohol Use, Illicit Drugs Psych involvement (Current and /or in the community):  No (Comment)  Discharge Needs  Concerns to be addressed:  Care Coordination Readmission within the last 30 days:  No Current discharge risk:  Cognitively Impaired, Dependent with Mobility, Lives alone Barriers to Discharge:  Continued Medical Work up, Insurance Authorization   Margarito Liner, LCSW 01/02/2019, 1:32 PM

## 2019-01-02 NOTE — Progress Notes (Signed)
[place] TEAM 1 - Stepdown/ICU TEAM  SAMMEY FREE  OAC:[id number] DOB: March 05, 1961 DOA: 12/16/2018 PCP: Gareth Morgan, MD    Brief Narrative:  58 year old male with a history of bipolar disorder, COPD, diastolic heart failure as well as polysubstance abuse who presented with respiratory failure from Klebsiella pneumonia on 1/24 and was intubated. Extubated on 2/5 with course complicated by alcohol withdrawal w/ a seizure on 1/27 and then again on 1/30. Patient has completed full course for Klebsiella pneumonia.  Subjective: The patient is sedated having just received pain medication.  His daughter is at bedside.  She reports that he is having severe low back pain.  He remains confused and agitated at times but his daughter feels that he is slowly improving.  There is no evidence of respiratory distress or uncontrolled pain at the time of my visit.  Assessment & Plan:  Acute toxic metabolicencephalopathy CT head 2/8 w/ no acute changes -felt to primarily be related to alcohol withdrawal -slowly improving  Alcohol use disorder, severe, dependence - Alcohol withdrawal with delirium  Continue treatment per CIWA  Diarrhea C diff negative -monitor  1 of 2 blood cx 2/9 + Staph epi Suspect simple contaminant -will not treat for now -follow clinically  COPD, severe Well compensated at this time  Bipolar disorder with severe depression   Acute on chronic diastolic CHF Now appears to be euvolemic/somewhat dehydrated -initiate free water via IV and follow Filed Weights   12/31/18 0310 01/01/19 0504 01/02/19 0306  Weight: 64.6 kg 60.1 kg 60.9 kg     Acute hypoxic respiratory failure Secondary to pneumonia and COPD -wean oxygen as able -stabilizing  Hypokalemia Mg is normal -replace via IV and follow  Hypernatremia  Due to free water deficit -change IV fluid to hypotonic solution and follow  Nutrition The patient remains too altered to allow safe oral intake - may soon  require NG tube for feeding, but hope to avoid as it will likely add to his agitation   DVT prophylaxis: lovenox  Code Status: FULL CODE Family Communication: Spoke with daughter at bedside Disposition Plan: Stable for medical telemetry bed  Consultants:  PCCM  Antimicrobials:  Rocephin 1/26 > 2/3   Objective: Blood pressure 127/69, pulse (!) 117, temperature 99 F (37.2 C), temperature source Axillary, resp. rate 20, height 5\' 6"  (1.676 m), weight 60.9 kg, SpO2 92 %.  Intake/Output Summary (Last 24 hours) at 01/02/2019 1222 Last data filed at 01/02/2019 0400 Gross per 24 hour  Intake 2208.95 ml  Output 2525 ml  Net -316.05 ml   Filed Weights   12/31/18 0310 01/01/19 0504 01/02/19 0306  Weight: 64.6 kg 60.1 kg 60.9 kg    Examination: General: No acute respiratory distress -sedated Lungs: Clear to auscultation bilaterally without wheezes or crackles Cardiovascular: Regular rate and rhythm without murmur gallop or rub normal S1 and S2 Abdomen: Nontender, nondistended, soft, bowel sounds positive, no rebound, no ascites, no appreciable mass Extremities: No significant cyanosis, clubbing, or edema bilateral lower extremities  CBC: Recent Labs  Lab 12/28/18 0418 12/29/18 0322 12/31/18 0757 01/01/19 0033 01/02/19 0611  WBC 5.6 11.0* 4.3 4.2 4.2  NEUTROABS 4.4 9.9*  --   --  3.1  HGB 9.3* 10.4* 9.8* 12.1* 11.1*  HCT 29.3* 32.1* 31.2* 36.6* 36.0*  MCV 94.2 89.4 91.0 90.8 92.1  PLT 221 343 310 362 329   Basic Metabolic Panel: Recent Labs  Lab 12/28/18 0418 12/29/18 0322  12/30/18 0655 12/31/18 0433 01/01/19 0033 01/01/19 1031  01/02/19 0611  NA 145 143   < > 143 145 146* 146* 152*  K 3.1* 3.3*   < > 3.3* 2.8* 2.9* 2.5* 3.4*  CL 112* 104   < > 99 103 106 108 117*  CO2 26 26   < > 27 32 20* 22 22  GLUCOSE 91 94   < > 93 85 97 104* 130*  BUN 16 7   < > 8 8 20 18 13   CREATININE 0.68 0.68   < > 0.73 0.65 0.99 0.76 0.61  CALCIUM 7.7* 8.0*   < > 8.4* 8.1* 8.3* 8.4*  8.2*  MG 1.9 1.8  --  1.6* 1.9  --   --  2.2  PHOS 2.9 3.3  --  2.5  --   --   --   --    < > = values in this interval not displayed.   GFR: Estimated Creatinine Clearance: 86.7 mL/min (by C-G formula based on SCr of 0.61 mg/dL).  Liver Function Tests: Recent Labs  Lab 01/02/19 0611  AST 18  ALT 22  ALKPHOS 135*  BILITOT 0.5  PROT 5.7*  ALBUMIN 2.1*    Recent Labs  Lab 12/31/18 0757  AMMONIA 49*   CBG: Recent Labs  Lab 01/01/19 1955 01/01/19 2302 01/02/19 0308 01/02/19 0726 01/02/19 1125  GLUCAP 104* 94 108* 116* 97    Recent Results (from the past 240 hour(s))  Culture, respiratory (non-expectorated)     Status: None   Collection Time: 12/23/18  1:59 PM  Result Value Ref Range Status   Specimen Description TRACHEAL ASPIRATE  Final   Special Requests NONE  Final   Gram Stain   Final    MODERATE WBC PRESENT, PREDOMINANTLY PMN MODERATE YEAST WITH PSEUDOHYPHAE    Culture   Final    FEW Consistent with normal respiratory flora. Performed at [facility], [address], [place], [place] [address]    Report Status 12/25/2018 FINAL  Final  Culture, Urine     Status: Abnormal (Preliminary result)   Collection Time: 12/31/18 11:26 PM  Result Value Ref Range Status   Specimen Description URINE, RANDOM  Final   Special Requests   Final    NONE Performed at [facility], [address], [place], [place] [address]    Culture STAPHYLOCOCCUS EPIDERMIDIS (A)  Final   Report Status PENDING  Incomplete  Culture, blood (routine x 2)     Status: None (Preliminary result)   Collection Time: 01/01/19 12:33 AM  Result Value Ref Range Status   Specimen Description BLOOD LEFT HAND  Final   Special Requests   Final    BOTTLES DRAWN AEROBIC AND ANAEROBIC Blood Culture adequate volume   Culture  Setup Time   Final    GRAM POSITIVE COCCI AEROBIC BOTTLE ONLY CRITICAL RESULT CALLED TO, READ BACK BY AND VERIFIED WITH: PHARMD [facility] [phone] FCP    Culture PENDING   Incomplete   Report Status PENDING  Incomplete  Blood Culture ID Panel (Reflexed)     Status: Abnormal   Collection Time: 01/01/19 12:33 AM  Result Value Ref Range Status   Enterococcus species NOT DETECTED NOT DETECTED Final   Listeria monocytogenes NOT DETECTED NOT DETECTED Final   Staphylococcus species DETECTED (A) NOT DETECTED Final    Comment: Methicillin (oxacillin) resistant coagulase negative staphylococcus. Possible blood culture contaminant (unless isolated from more than one blood culture draw or clinical case suggests pathogenicity). No antibiotic treatment is  indicated for blood  culture contaminants. CRITICAL RESULT CALLED TO, READ BACK BY AND VERIFIED WITH: PHARMD [facility] [id number] FCP    Staphylococcus aureus (BCID) NOT DETECTED NOT DETECTED Final   Methicillin resistance DETECTED (A) NOT DETECTED Final    Comment: CRITICAL RESULT CALLED TO, READ BACK BY AND VERIFIED WITH: PHARMD [facility] [id number] FCP    Streptococcus species NOT DETECTED NOT DETECTED Final   Streptococcus agalactiae NOT DETECTED NOT DETECTED Final   Streptococcus pneumoniae NOT DETECTED NOT DETECTED Final   Streptococcus pyogenes NOT DETECTED NOT DETECTED Final   Acinetobacter baumannii NOT DETECTED NOT DETECTED Final   Enterobacteriaceae species NOT DETECTED NOT DETECTED Final   Enterobacter cloacae complex NOT DETECTED NOT DETECTED Final   Escherichia coli NOT DETECTED NOT DETECTED Final   Klebsiella oxytoca NOT DETECTED NOT DETECTED Final   Klebsiella pneumoniae NOT DETECTED NOT DETECTED Final   Proteus species NOT DETECTED NOT DETECTED Final   Serratia marcescens NOT DETECTED NOT DETECTED Final   Haemophilus influenzae NOT DETECTED NOT DETECTED Final   Neisseria meningitidis NOT DETECTED NOT DETECTED Final   Pseudomonas aeruginosa NOT DETECTED NOT DETECTED Final   Candida albicans NOT DETECTED NOT DETECTED Final   Candida glabrata NOT DETECTED NOT DETECTED Final   Candida krusei NOT DETECTED NOT  DETECTED Final   Candida parapsilosis NOT DETECTED NOT DETECTED Final   Candida tropicalis NOT DETECTED NOT DETECTED Final  C difficile quick scan w PCR reflex     Status: None   Collection Time: 01/01/19 10:23 AM  Result Value Ref Range Status   C Diff antigen NEGATIVE NEGATIVE Final   C Diff toxin NEGATIVE NEGATIVE Final   C Diff interpretation No C. difficile detected.  Final    Comment: Performed at [facility], [address] [place], [place] [address]     Scheduled Meds: . cloNIDine  0.3 mg Transdermal Weekly  . enoxaparin (LOVENOX) injection  40 mg Subcutaneous Q24H  . folic acid  1 mg Intravenous Daily  . insulin aspart  0-9 Units Subcutaneous Q4H  . LORazepam  0-4 mg Intravenous Q12H  . mouth rinse  15 mL Mouth Rinse BID  . pantoprazole (PROTONIX) IV  40 mg Intravenous Q24H  . polyethylene glycol  17 g Per Tube BID  . senna-docusate  1 tablet Per Tube Daily  . sodium chloride flush  10-40 mL Intracatheter Q12H  . sodium chloride flush  3 mL Intravenous Q12H  . thiamine injection  100 mg Intravenous Daily  . valproic acid  500 mg Oral TID     LOS: 17 days   Lonia BloodJeffrey T. McClung, MD [facility] Office  [phone] Pager - Text Page per Amion  If 7PM-7AM, please contact night-coverage per Amion 01/02/2019, 12:22 PM

## 2019-01-03 ENCOUNTER — Telehealth: Payer: Self-pay | Admitting: Emergency Medicine

## 2019-01-03 LAB — GLUCOSE, CAPILLARY
Glucose-Capillary: 100 mg/dL — ABNORMAL HIGH (ref 70–99)
Glucose-Capillary: 104 mg/dL — ABNORMAL HIGH (ref 70–99)
Glucose-Capillary: 104 mg/dL — ABNORMAL HIGH (ref 70–99)
Glucose-Capillary: 108 mg/dL — ABNORMAL HIGH (ref 70–99)
Glucose-Capillary: 97 mg/dL (ref 70–99)
Glucose-Capillary: 98 mg/dL (ref 70–99)

## 2019-01-03 LAB — COMPREHENSIVE METABOLIC PANEL
ALT: 19 U/L (ref 0–44)
AST: 17 U/L (ref 15–41)
Albumin: 2 g/dL — ABNORMAL LOW (ref 3.5–5.0)
Alkaline Phosphatase: 125 U/L (ref 38–126)
Anion gap: 9 (ref 5–15)
BUN: 8 mg/dL (ref 6–20)
CO2: 23 mmol/L (ref 22–32)
Calcium: 8.6 mg/dL — ABNORMAL LOW (ref 8.9–10.3)
Chloride: 123 mmol/L — ABNORMAL HIGH (ref 98–111)
Creatinine, Ser: 0.75 mg/dL (ref 0.61–1.24)
GFR calc Af Amer: >60 mL/min (ref >60)
GFR calc non Af Amer: >60 mL/min (ref >60)
Glucose, Bld: 115 mg/dL — ABNORMAL HIGH (ref 70–99)
Potassium: 3.1 mmol/L — ABNORMAL LOW (ref 3.5–5.1)
Sodium: 155 mmol/L — ABNORMAL HIGH (ref 135–145)
Total Bilirubin: 0.7 mg/dL (ref 0.3–1.2)
Total Protein: 5.7 g/dL — ABNORMAL LOW (ref 6.5–8.1)

## 2019-01-03 LAB — CULTURE, BLOOD (ROUTINE X 2): Special Requests: ADEQUATE

## 2019-01-03 LAB — BASIC METABOLIC PANEL
Anion gap: 11 (ref 5–15)
BUN: 8 mg/dL (ref 6–20)
CO2: 22 mmol/L (ref 22–32)
Calcium: 8.6 mg/dL — ABNORMAL LOW (ref 8.9–10.3)
Chloride: 122 mmol/L — ABNORMAL HIGH (ref 98–111)
Creatinine, Ser: 0.78 mg/dL (ref 0.61–1.24)
GFR calc Af Amer: >60 mL/min (ref >60)
GFR calc non Af Amer: >60 mL/min (ref >60)
Glucose, Bld: 108 mg/dL — ABNORMAL HIGH (ref 70–99)
Potassium: 3.4 mmol/L — ABNORMAL LOW (ref 3.5–5.1)
Sodium: 155 mmol/L — ABNORMAL HIGH (ref 135–145)

## 2019-01-03 LAB — URINE CULTURE

## 2019-01-03 LAB — CBC
HCT: 34.6 % — ABNORMAL LOW (ref 39.0–52.0)
Hemoglobin: 10.7 g/dL — ABNORMAL LOW (ref 13.0–17.0)
MCH: 28.4 pg (ref 26.0–34.0)
MCHC: 30.9 g/dL (ref 30.0–36.0)
MCV: 91.8 fL (ref 80.0–100.0)
Platelets: 382 10*3/uL (ref 150–400)
RBC: 3.77 MIL/uL — ABNORMAL LOW (ref 4.22–5.81)
RDW: 13 % (ref 11.5–15.5)
WBC: 5.2 10*3/uL (ref 4.0–10.5)
nRBC: 0 % (ref 0.0–0.2)

## 2019-01-03 LAB — AMMONIA: Ammonia: 46 umol/L — ABNORMAL HIGH (ref 9–35)

## 2019-01-03 LAB — OSMOLALITY: Osmolality: 315 mOsm/kg — ABNORMAL HIGH (ref 275–295)

## 2019-01-03 LAB — MAGNESIUM: Magnesium: 2 mg/dL (ref 1.7–2.4)

## 2019-01-03 MED ORDER — ORAL CARE MOUTH RINSE
15.0000 mL | Freq: Two times a day (BID) | OROMUCOSAL | Status: DC
  Administered 2019-01-04 – 2019-01-06 (×3): 15 mL via OROMUCOSAL

## 2019-01-03 MED ORDER — CHLORHEXIDINE GLUCONATE 0.12 % MT SOLN
15.0000 mL | Freq: Two times a day (BID) | OROMUCOSAL | Status: DC
  Administered 2019-01-03 – 2019-01-12 (×16): 15 mL via OROMUCOSAL
  Filled 2019-01-03 (×16): qty 15

## 2019-01-03 NOTE — Care Management Important Message (Signed)
Important Message  Patient Details  Name: Jeffrey [name] MRN: [id number] Date of Birth: 12/22/1960   Medicare Important Message Given:  Yes    Demetruis Depaul Stefan Church 01/03/2019, 3:35 PM

## 2019-01-03 NOTE — Progress Notes (Signed)
Pt continued to be restless and exposing self naked even when coved and entangled self  With the monitor leads and iv  With several attempts holding on the side rails trying to get oob  Assisted back to bed with maxi assist and reposition several tines but with confusion continue to exposed self. Was medicated with pain with less effect. Daughter notified of pt behavior and at high risk for all as he was difficultto be  Reoriented and redirected. Request for I  : 1 observation but none provided.  Floor NT and RN were sitting and closely monitoring pt untii 2nd  dose of ativan was somehow effective.    Daughter later came in to sit wit pt. Low bed requested to transfer pt into to  Prevent fall fall but  daughter request it to be later as pt is resting. All fall and seizure precaution measures in place. Pt covered with the top sheet anytime RN goes in and found  exposing  self.

## 2019-01-03 NOTE — Progress Notes (Addendum)
Pt has been agitated and restless with extreme anxiety ,with several attempts to get o ob  Was being monitored closely CI WA 12 and ativan 2 mg given at beginning of shift p Fall . floor mat and seizure pads in use will continue to monitor pt closely.

## 2019-01-03 NOTE — Progress Notes (Addendum)
Speech Language Pathology Treatment: Dysphagia  Patient Details Name: Jeffrey [name] MRN: [id number] DOB: 1960/12/31 Today's Date: 01/03/2019 Time: 4403-4742 SLP Time Calculation (min) (ACUTE ONLY): 41 min  Assessment / Plan / Recommendation Clinical Impression  Pt seen for dysphagia intervention, possibility for po's and education with son and (?) son's significant other. He recently had Dilaudid leaving him lethargic and unable to remain awake. SLP was able to view oral cavity which was covered in yellow/dark brown dried secretions over majority of hard/soft palate, tongue and lips. Per family and SLP notes pt has been refused staff when attempting oral care. Sedation allowed/enabled pt to participate in vigorous oral decontamination over a 40 minute period. Pt coughing and therapist able to retrieve large amounts of whitish mucous. Of note when he swallowed secretions which is minimal due to NPO status, he immediately coughed. SLP successful in removal of 90% of hard/soft palate debris and approximately half off tongue.   PO's were not administered given level of current lethargy. He is unable to manage his oral secretions with significant pharyngeal congestion and will need alternate means of nutrition. Recognize this challenge due to his impulsivity, refusal and at times, agitation. Pt lacks ability to perform strong volitional cough to assist in secretion mobilization. Treatment will focus on bolus manipulation, cough-swallow integrity and oral hygiene.   Continue with frequent attempts at oral care to assist in decreasing significant xerostomia.    HPI HPI: Pt is a 58 y.o. male with PMH significant for alcohol and polysubstance abuse, COPD, bipolar disorder, and chronic diastolic CHF, presented to ED with poor responsiveness, altered mental status and combativness. He reports to have been out drinking with friends. Pt has had recurring likely alcohol withdrawal seizures during admission. EEG  on 1/31 revealed abnormal EEG secondary to general background slowing; finding may be seen with a diffuse disturbance that is etiologically nonspecific, but may include a metabolic encephalopathy or medication effect, among other possibilities. No epileptiform activity was noted.  CXR on 1/27 revealed worsening left greater than right lung opacities suggesting pneumonia. Intubated 1/24- 2/5.      SLP Plan  Continue with current plan of care       Recommendations  Diet recommendations: NPO Medication Administration: Via alternative means                Oral Care Recommendations: Oral care QID Follow up Recommendations: Skilled Nursing facility SLP Visit Diagnosis: Dysphagia, unspecified (R13.10) Plan: Continue with current plan of care       GO                Royce Macadamia 01/03/2019, 3:16 PM   Breck Coons Lalo Tromp M.Ed Nurse, children's [phone] Office [phone]

## 2019-01-03 NOTE — Progress Notes (Signed)
PROGRESS NOTE    Jeffrey [name]  RUE:[id number] DOB: [birth year] DOA: 12/16/2018 PCP: Gareth Morgan, MD   Brief Narrative:  58 year old male with a history of bipolar disorder, COPD, diastolic heart failure as well as polysubstance abuse who presented with respiratory failure from Klebsiella pneumonia on 1/24 and was intubated. Extubated on 2/5 with course complicated by alcohol withdrawal w/ a seizure on 1/27 and then again on 1/30. Patient has completed full course for Klebsiella pneumonia and was transferred out of ICU to floor on 2/10 pm.     Assessment & Plan:   Principal Problem:   Acute encephalopathy Active Problems:   COPD, severe (HCC)   Bipolar disorder with severe depression (HCC)   Alcohol use disorder, severe, dependence (HCC)   Chronic diastolic CHF (congestive heart failure) (HCC)   Acute hepatic encephalopathy   Respiratory failure (HCC)   Acute respiratory failure with hypoxemia (HCC)   Alcohol withdrawal syndrome, with delirium (HCC)   Acute toxic metabolicencephalopathy CT head 2/8 w/ no acute changes -felt to primarily be related to alcohol withdrawal -slowly improving, discussed with daughter 2/11 who indicated at baseline he is often 'foggy", but reported he is improving.  Question underlying wernicke korsakof  Alcohol use disorder, severe, dependence - Alcohol withdrawal with delirium  Continue treatment per [facility] staff and daughter report he is improving  Diarrhea C diff negative -monitor replete electrolytes and fluid  Questionable bacteremia.  1 of 2 blood cx 2/9 + Staph epi Suspect simple contaminant -will not treat for now -repeat blood cultures today Patient is afebrile with a normal white blood cell count.  COPD, severe Well compensated at this time  Bipolar disorder with severe depression:  patient on Depacon, continue for now  Acute on chronic diastolic CHF Now appears to be euvolemic/somewhat dehydrated -start on D5W in the ICU,  will continue for now recheck a BMP today at 1400.  Check serum and urine osmole's, urine sodium  Acute hypoxic respiratory failure Secondary to pneumonia and COPD -wean oxygen as able -stable for now continue with current O2 levels  Hypokalemia Mg is normal  repleted, recheck in the morning.  Hypernatremia  Due to free water deficit -continue with D5W, check BMP today and in the morning.  Nutrition The patient remains too altered to allow safe oral intake -3 patient may soon require NG tube for feeding, but hope to avoid as it will likely add to his agitation.  Family does report mental status is improving slowly.   DVT prophylaxis: Lovenox SQ  Code Status: FULL    Code Status Orders  (From admission, onward)         Start     Ordered   12/16/18 1213  Full code  Continuous     12/16/18 1216        Code Status History    Date Active Date Inactive Code Status Order ID Comments User Context   12/16/2018 0429 12/16/2018 1216 Full Code [id number]  Briscoe Deutscher, MD ED   09/16/2018 1536 09/17/2018 1342 Full Code [id number]  Maurilio Lovely D, DO ED   06/16/2018 0750 06/17/2018 1356 DNR [id number]  Meredeth Ide, MD Inpatient   01/31/2018 2049 02/08/2018 2202 Full Code [id number]  Kerry Hough, PA-C Inpatient   01/31/2018 0533 01/31/2018 1850 Full Code [id number]  Zadie Rhine, MD ED   04/07/2017 2311 04/15/2017 1357 Full Code [id number]  Alberteen Sam, MD Inpatient   02/07/2017 2313 02/10/2017 1645 Full Code [id number]  Tarry Kos  A, MD Inpatient     Family Communication: DAUGHTER AT BEDSIDE  Disposition Plan:   UNKNOWN AT THIS TIME Consults called: None Admission status: Inpatient   Consultants:   pccm  Procedures:  Dg Chest 1 View  Result Date: 12/31/2018 CLINICAL DATA:  58 year old male with history of hypoxia. Productive cough. EXAM: CHEST  1 VIEW COMPARISON:  Chest x-ray 12/30/2018. FINDINGS: Diffuse peribronchial cuffing and patchy interstitial prominence,  most evident throughout the mid to lower lungs bilaterally, slightly improved compared to the prior study. No confluent consolidative airspace disease. No pleural effusions. No evidence of pulmonary edema. Heart size is normal. The patient is rotated to the right on today's exam, resulting in distortion of the mediastinal contours and reduced diagnostic sensitivity and specificity for mediastinal pathology. IMPRESSION: 1. Diffuse peribronchial cuffing and interstitial prominence concerning for severe bronchitis and probable multilobar bronchopneumonia, slightly improved compared to the prior study. Electronically Signed   By: Trudie Reed M.D.   On: 12/31/2018 20:30   Dg Abd 1 View  Result Date: 12/23/2018 CLINICAL DATA:  Abdominal pain.  Possible bowel obstruction. EXAM: ABDOMEN - 1 VIEW COMPARISON:  None. FINDINGS: NG tube is in place with both the side port and tip in the body of the stomach. The stomach is distended. There is also gaseous distention of small and large bowel. No evidence of free intraperitoneal air on supine images. IMPRESSION: OG tube in good position and distended stomach. Gaseous distention of small and large bowel most compatible with ileus. Electronically Signed   By: Drusilla Kanner M.D.   On: 12/23/2018 15:02   Ct Head Wo Contrast  Result Date: 12/31/2018 CLINICAL DATA:  Encephalopathy. No reported injury. EXAM: CT HEAD WITHOUT CONTRAST TECHNIQUE: Contiguous axial images were obtained from the base of the skull through the vertex without intravenous contrast. COMPARISON:  12/16/2018 head CT. FINDINGS: Brain: No evidence of parenchymal hemorrhage or extra-axial fluid collection. No mass lesion, mass effect, or midline shift. No CT evidence of acute infarction. Stable small perivesical spaces in the right basal ganglia. Cerebral volume is age appropriate. No ventriculomegaly. Vascular: No acute abnormality. Skull: No evidence of calvarial fracture. Sinuses/Orbits: No fluid  levels. Mild mucoperiosteal thickening throughout the paranasal sinuses. Other:  The mastoid air cells are unopacified. IMPRESSION: 1. No evidence of acute intracranial abnormality. 2. Mild paranasal sinusitis of uncertain acuity. Electronically Signed   By: Delbert Phenix M.D.   On: 12/31/2018 22:53   Ct Head Wo Contrast  Result Date: 12/16/2018 CLINICAL DATA:  58 year old male with altered mental status. EXAM: CT HEAD WITHOUT CONTRAST TECHNIQUE: Contiguous axial images were obtained from the base of the skull through the vertex without intravenous contrast. COMPARISON:  Head CT dated 04/12/2017 an MRI dated 04/12/2017 FINDINGS: Brain: The ventricles and sulci appropriate size for patient's age. Minimal periventricular and deep white matter chronic microvascular ischemic changes noted. There is no acute intracranial hemorrhage. No mass effect or midline shift. No extra-axial fluid collection. Vascular: No hyperdense vessel or unexpected calcification. Skull: Normal. Negative for fracture or focal lesion. Sinuses/Orbits: There is opacification of the nasal passages. The main there of the visualized paranasal sinuses and mastoid air cells are clear. Other: An endotracheal and an enteric tube are partially visualized. IMPRESSION: No acute intracranial pathology. Electronically Signed   By: Elgie Collard M.D.   On: 12/16/2018 03:49   Mr Brain Wo Contrast  Result Date: 12/22/2018 CLINICAL DATA:  Encephalopathy.  Altered level of consciousness. EXAM: MRI HEAD WITHOUT CONTRAST TECHNIQUE: Multiplanar,  multiecho pulse sequences of the brain and surrounding structures were obtained without intravenous contrast. COMPARISON:  CT head 12/16/2018 FINDINGS: Brain: Ventricle size and cerebral volume normal. Negative for acute infarct. Negative for hemorrhage or mass. No midline shift. Vascular: Normal arterial flow voids Skull and upper cervical spine: Negative Sinuses/Orbits: Mild mucosal edema paranasal sinuses. Normal  orbit. Patient is intubated. Other: None IMPRESSION: No acute abnormality. Electronically Signed   By: Marlan Palau M.D.   On: 12/22/2018 14:07   Dg Chest Port 1 View  Result Date: 12/30/2018 CLINICAL DATA:  Respiratory failure EXAM: PORTABLE CHEST 1 VIEW COMPARISON:  12/29/2018 FINDINGS: Cardiac shadow is stable. Lungs are well aerated bilaterally. Increased vascular congestion and pulmonary edema is noted slightly improved when compare with the prior exam. Some posteriorly layering effusions may be present bilaterally. No bony abnormality is seen. IMPRESSION: Changes of CHF and pulmonary edema although mildly improved from the prior exam. Electronically Signed   By: Alcide Clever M.D.   On: 12/30/2018 08:23   Dg Chest Port 1 View  Result Date: 12/29/2018 CLINICAL DATA:  Respiratory failure EXAM: PORTABLE CHEST 1 VIEW COMPARISON:  12/28/2018 FINDINGS: Endotracheal tube and NG tube removed. Progression of diffuse bilateral airspace disease which is symmetric and may represent edema. Small bilateral pleural effusions and bibasilar atelectasis. IMPRESSION: Endotracheal tube and NG tube removed Progression of pulmonary edema. Electronically Signed   By: Marlan Palau M.D.   On: 12/29/2018 06:50   Dg Chest Port 1 View  Result Date: 12/28/2018 CLINICAL DATA:  Respiratory failure. EXAM: PORTABLE CHEST 1 VIEW COMPARISON:  Yesterday FINDINGS: The endotracheal tube tip is between the clavicular heads and carina. The orogastric tube tip reaches the proximal stomach. Unchanged interstitial coarsening and hazy lower lung opacity, symmetry favoring edema. Normal heart size. No definite pleural fluid. No pneumothorax. IMPRESSION: Stable hardware positioning and lower lung opacification. Electronically Signed   By: Marnee Spring M.D.   On: 12/28/2018 08:13   Dg Chest Port 1 View  Result Date: 12/27/2018 CLINICAL DATA:  Followup ventilator support EXAM: PORTABLE CHEST 1 VIEW COMPARISON:  12/26/2018 FINDINGS:  Endotracheal tube tip is 3 cm above the carina. Nasogastric tube enters the stomach. There is improved aeration in both lower lobes. No worsening or new finding. IMPRESSION: Improving lower lobe infiltrate/pneumonia with better aeration. Electronically Signed   By: Paulina Fusi M.D.   On: 12/27/2018 07:16   Dg Chest Port 1 View  Result Date: 12/26/2018 CLINICAL DATA:  Respiratory failure EXAM: PORTABLE CHEST 1 VIEW COMPARISON:  12/23/2018 FINDINGS: Endotracheal tube tip is 21 mm above the carina. The orogastric tube reaches the stomach. Diffuse interstitial coarsening with hazy lower chest opacities, increased. No evident pneumothorax. Normal heart size. IMPRESSION: 1. Lower endotracheal tube, tip 2 cm above the carina. 2. Increased bilateral interstitial interstitial opacity favoring edema. Electronically Signed   By: Marnee Spring M.D.   On: 12/26/2018 07:18   Dg Chest Port 1 View  Result Date: 12/23/2018 CLINICAL DATA:  Reason for chest exam: Aspiration into airway. Hx of HTN, PNA, COPD. EXAM: PORTABLE CHEST 1 VIEW COMPARISON:  12/20/2018 and older exams. FINDINGS: Since the previous study, interstitial airspace opacities in the left mid to lower lung have improved. Residual lung opacities are primarily interstitial type opacities in the medial lung bases, left greater than right. No new lung abnormalities. No pneumothorax. Endotracheal tube and nasal/orogastric tube are stable and well positioned. IMPRESSION: 1. Improved left mid to lower lung pneumonia. 2. No new abnormalities. Electronically  Signed   By: Amie Portlandavid  Ormond M.D.   On: 12/23/2018 15:02   Dg Chest Port 1 View  Result Date: 12/20/2018 CLINICAL DATA:  Respiratory failure EXAM: PORTABLE CHEST 1 VIEW COMPARISON:  Yesterday FINDINGS: Endotracheal tube tip between the clavicular heads and carina. New orogastric tube reaches the stomach. Extensive pneumonia on the left and likely at the medial right base. Possible small left effusion. No  pneumothorax. No cardiomegaly IMPRESSION: Unchanged hardware positioning and pneumonia. Electronically Signed   By: Marnee SpringJonathon  Watts M.D.   On: 12/20/2018 07:40   Dg Chest Port 1 View  Result Date: 12/19/2018 CLINICAL DATA:  Respiratory failure. EXAM: PORTABLE CHEST 1 VIEW COMPARISON:  12/18/2018 FINDINGS: Endotracheal tube terminates 3 cm above the carina. Enteric tube terminates over the stomach. The cardiomediastinal silhouette is unchanged. Extensive opacities throughout the left mid and lower lung have increased. Milder opacities persist in the right perihilar region and right lung base. There may be a small left pleural effusion. No pneumothorax is identified. IMPRESSION: Worsening left greater than right lung opacities suggesting pneumonia. Electronically Signed   By: Sebastian AcheAllen  Grady M.D.   On: 12/19/2018 06:46   Portable Chest X-ray  Result Date: 12/18/2018 CLINICAL DATA:  Endotracheal and enteric tube placement. EXAM: PORTABLE CHEST 1 VIEW COMPARISON:  20 minutes prior. FINDINGS: Endotracheal tube tip just below the thoracic inlet 5.7 cm from the carina. Enteric tube in place with tip and side-port below the diaphragm. Unchanged bilateral perihilar opacities, left greater than right. Unchanged heart size and mediastinal contours. No other new abnormalities. IMPRESSION: 1. Endotracheal tube tip 5.7 cm from the carina. Tip and side port of the enteric tube below the diaphragm. 2. Unchanged bilateral perihilar opacities, left greater than right. Differential considerations include pulmonary edema or aspiration/pneumonia. Electronically Signed   By: Narda RutherfordMelanie  Sanford M.D.   On: 12/18/2018 00:45   Dg Chest Port 1 View  Result Date: 12/18/2018 CLINICAL DATA:  Respiratory distress. EXAM: PORTABLE CHEST 1 VIEW COMPARISON:  Radiograph earlier this day, additional priors. FINDINGS: Endotracheal and enteric tubes have been removed. Development of left greater than right patchy perihilar opacities since exam  earlier this day. Unchanged heart size and mediastinal contours. No large pleural effusion. No pneumothorax. Chronic hyperinflation. IMPRESSION: Development of left greater than right patchy perihilar opacities since exam earlier this day. Differential considerations include pulmonary edema versus aspiration if there is history of vomiting. Electronically Signed   By: Narda RutherfordMelanie  Sanford M.D.   On: 12/18/2018 00:20   Dg Chest Port 1 View  Result Date: 12/17/2018 CLINICAL DATA:  Endotracheal tube EXAM: PORTABLE CHEST 1 VIEW COMPARISON:  Yesterday FINDINGS: Endotracheal tube tip 12 mm above the carina. The orogastric tube tip is at the pylorus. Generalized interstitial coarsening with mild atelectasis at the bases. Right apex is excluded from view. No evident pneumothorax. Normal heart size. IMPRESSION: 1. Lower endotracheal tube, tip 12 mm above the carina. 2. Bronchitic markings and atelectasis. Electronically Signed   By: Marnee SpringJonathon  Watts M.D.   On: 12/17/2018 06:37   Dg Chest Port 1 View  Result Date: 12/16/2018 CLINICAL DATA:  Endotracheal position EXAM: PORTABLE CHEST 1 VIEW COMPARISON:  Earlier same day FINDINGS: Endotracheal tube tip 3 cm above the carina. Nasogastric tube enters the stomach. Upper lungs remain clear. Mild patchy infiltrate or volume loss in the left lower lobe. IMPRESSION: Lines and tubes well positioned. Patchy infiltrate or volume loss in the left lower lobe. Electronically Signed   By: Scherrie BatemanMark  Shogry M.D.  On: 12/16/2018 12:36   Dg Chest Port 1 View  Result Date: 12/16/2018 CLINICAL DATA:  Patient found by family unresponsive. Altered mental status. EXAM: PORTABLE CHEST 1 VIEW COMPARISON:  09/16/2018 FINDINGS: Heart size is top-normal. Nonaneurysmal slightly atherosclerotic aorta with central pulmonary vascular congestion. The patient's chin obscures the right lung apex. No acute pulmonary consolidation, effusion or pneumothorax. No acute osseous abnormality. IMPRESSION: Pulmonary  vascular congestion.  Minimal aortic atherosclerosis. Electronically Signed   By: Tollie Eth M.D.   On: 12/16/2018 01:50   Dg Chest Port 1v Same Day  Result Date: 12/16/2018 CLINICAL DATA:  Post intubation imaging. EXAM: PORTABLE CHEST 1 VIEW COMPARISON:  None. FINDINGS: Endotracheal tube tip is noted 7.5 cm above the carina. A gastric tube with side port extends into the expected location of the stomach. The lungs are mildly hyperinflated with central vascular congestion noted. No acute osseous abnormality. Normal heart size and mediastinal contours. IMPRESSION: 1. Endotracheal tube tip is approximately 7.5 cm above the carina. 2. Gastric tube extends into the expected location of the stomach. 3. Hyperinflated lungs with central vascular congestion. Electronically Signed   By: Tollie Eth M.D.   On: 12/16/2018 03:36   US Abdomen Limited Ruq  Result Date: 12/19/2018 CLINICAL DATA:  Increased ammonia level EXAM: ULTRASOUND ABDOMEN LIMITED RIGHT UPPER QUADRANT COMPARISON:  None. FINDINGS: Gallbladder: No gallstones or wall thickening visualized. No sonographic Murphy sign noted by sonographer. Common bile duct: Diameter: 1.7 mm Liver: No focal lesion identified. Within normal limits in parenchymal echogenicity. Portal vein is patent on color Doppler imaging with normal direction of blood flow towards the liver. Other: Hypoechoic small to moderate complex fluid noted at the right lung base compatible with a pleural effusion with debris. IMPRESSION: 1. Small to moderate right pleural effusion with internal echoes suggesting internal debris. 2. Unremarkable right upper quadrant abdominal ultrasound. Electronically Signed   By: Tollie Eth M.D.   On: 12/19/2018 20:41     Antimicrobials:   None currently, comleted a course of rocephin 1/23-2/3   Subjective: Patient remains confused, pulling at lines, although reported to be improving.   Objective: Vitals:   01/03/19 0356 01/03/19 0745 01/03/19 0950  01/03/19 1138  BP: (!) 128/57 (!) 118/91  129/65  Pulse: (!) 113 88  (!) 110  Resp: 18 20  20   Temp: 99.3 F (37.4 C)   98.8 F (37.1 C)  TempSrc: Oral   Oral  SpO2: 99% 97%  98%  Weight:      Height:   5\' 6"  (1.676 m)     Intake/Output Summary (Last 24 hours) at 01/03/2019 1203 Last data filed at 01/03/2019 4098 Gross per 24 hour  Intake 955 ml  Output 1400 ml  Net -445 ml   Filed Weights   12/31/18 0310 01/01/19 0504 01/02/19 0306  Weight: 64.6 kg 60.1 kg 60.9 kg    Examination:  General exam: Confused, unable to answer questions appropriately, Respiratory system: Mild rhonchi primarily upper airway, good air movement no wheezing no retractions Cardiovascular system: S1 & S2 heard, RRR. No JVD, murmurs, rubs, gallops or clicks. No pedal edema. Gastrointestinal system: Difficult exam secondary patient noncooperation abdomen is nondistended, soft and nontender. No organomegaly or masses felt. Normal bowel sounds heard. Central nervous system: Confused, no focal neurological deficit no contractures. Extremities: Symmetric 5 x 5 power.  No edema Skin: No rashes, lesions or ulcers Psychiatry: Judgement and insight poor, no acute change chronically confused concerning for underlying Warneke Korsakoff  Data Reviewed: I have personally reviewed following labs and imaging studies  CBC: Recent Labs  Lab 12/28/18 0418 12/29/18 0322 12/31/18 0757 01/01/19 0033 01/02/19 0611 01/03/19 0446  WBC 5.6 11.0* 4.3 4.2 4.2 5.2  NEUTROABS 4.4 9.9*  --   --  3.1  --   HGB 9.3* 10.4* 9.8* 12.1* 11.1* 10.7*  HCT 29.3* 32.1* 31.2* 36.6* 36.0* 34.6*  MCV 94.2 89.4 91.0 90.8 92.1 91.8  PLT 221 343 310 362 329 382   Basic Metabolic Panel: Recent Labs  Lab 12/28/18 0418 12/29/18 0322  12/30/18 0655 12/31/18 0433 01/01/19 0033 01/01/19 1031 01/02/19 0611 01/03/19 0446  NA 145 143   < > 143 145 146* 146* 152* 155*  K 3.1* 3.3*   < > 3.3* 2.8* 2.9* 2.5* 3.4* 3.1*  CL 112* 104    < > 99 103 106 108 117* 123*  CO2 26 26   < > 27 32 20* 22 22 23   GLUCOSE 91 94   < > 93 85 97 104* 130* 115*  BUN 16 7   < > 8 8 20 18 13 8   CREATININE 0.68 0.68   < > 0.73 0.65 0.99 0.76 0.61 0.75  CALCIUM 7.7* 8.0*   < > 8.4* 8.1* 8.3* 8.4* 8.2* 8.6*  MG 1.9 1.8  --  1.6* 1.9  --   --  2.2 2.0  PHOS 2.9 3.3  --  2.5  --   --   --   --   --    < > = values in this interval not displayed.   GFR: Estimated Creatinine Clearance: 86.7 mL/min (by C-G formula based on SCr of 0.75 mg/dL). Liver Function Tests: Recent Labs  Lab 01/02/19 0611 01/03/19 0446  AST 18 17  ALT 22 19  ALKPHOS 135* 125  BILITOT 0.5 0.7  PROT 5.7* 5.7*  ALBUMIN 2.1* 2.0*   No results for input(s): LIPASE, AMYLASE in the last 168 hours. Recent Labs  Lab 12/31/18 0757 01/03/19 0446  AMMONIA 49* 46*   Coagulation Profile: No results for input(s): INR, PROTIME in the last 168 hours. Cardiac Enzymes: No results for input(s): CKTOTAL, CKMB, CKMBINDEX, TROPONINI in the last 168 hours. BNP (last 3 results) No results for input(s): PROBNP in the last 8760 hours. HbA1C: No results for input(s): HGBA1C in the last 72 hours. CBG: Recent Labs  Lab 01/02/19 2008 01/03/19 0005 01/03/19 0433 01/03/19 0741 01/03/19 1135  GLUCAP 102* 108* 100* 98 104*   Lipid Profile: No results for input(s): CHOL, HDL, LDLCALC, TRIG, CHOLHDL, LDLDIRECT in the last 72 hours. Thyroid Function Tests: No results for input(s): TSH, T4TOTAL, FREET4, T3FREE, THYROIDAB in the last 72 hours. Anemia Panel: No results for input(s): VITAMINB12, FOLATE, FERRITIN, TIBC, IRON, RETICCTPCT in the last 72 hours. Sepsis Labs: No results for input(s): PROCALCITON, LATICACIDVEN in the last 168 hours.  Recent Results (from the past 240 hour(s))  Culture, Urine     Status: Abnormal   Collection Time: 12/31/18 11:26 PM  Result Value Ref Range Status   Specimen Description URINE, RANDOM  Final   Special Requests   Final    NONE Performed at  [facility], [address], [place], [place] [address]    Culture STAPHYLOCOCCUS EPIDERMIDIS (A)  Final   Report Status 01/03/2019 FINAL  Final   Organism ID, Bacteria STAPHYLOCOCCUS EPIDERMIDIS  Final      Susceptibility   Staphylococcus epidermidis - MIC*    CIPROFLOXACIN <=0.5 SENSITIVE  Sensitive     GENTAMICIN <=0.5 SENSITIVE Sensitive     NITROFURANTOIN 64 INTERMEDIATE Intermediate     OXACILLIN >=4 RESISTANT Resistant     TETRACYCLINE <=1 SENSITIVE Sensitive     VANCOMYCIN <=0.5 SENSITIVE Sensitive     TRIMETH/SULFA <=10 SENSITIVE Sensitive     CLINDAMYCIN >=8 RESISTANT Resistant     RIFAMPIN 16 RESISTANT Resistant     Inducible Clindamycin NEGATIVE Sensitive     * STAPHYLOCOCCUS EPIDERMIDIS  Culture, blood (routine x 2)     Status: None (Preliminary result)   Collection Time: 01/01/19 12:33 AM  Result Value Ref Range Status   Specimen Description BLOOD LEFT ANTECUBITAL  Final   Special Requests   Final    BOTTLES DRAWN AEROBIC AND ANAEROBIC Blood Culture results may not be optimal due to an inadequate volume of blood received in culture bottles   Culture   Final    NO GROWTH 2 DAYS Performed at [facility], [address] [place], [place] [address]    Report Status PENDING  Incomplete  Culture, blood (routine x 2)     Status: Abnormal (Preliminary result)   Collection Time: 01/01/19 12:33 AM  Result Value Ref Range Status   Specimen Description BLOOD LEFT HAND  Final   Special Requests   Final    BOTTLES DRAWN AEROBIC AND ANAEROBIC Blood Culture adequate volume   Culture  Setup Time   Final    GRAM POSITIVE COCCI AEROBIC BOTTLE ONLY CRITICAL RESULT CALLED TO, READ BACK BY AND VERIFIED WITH: PHARMD [facility] [id number] FCP    Culture STAPHYLOCOCCUS SPECIES (COAGULASE NEGATIVE) (A)  Final   Report Status PENDING  Incomplete  Blood Culture ID Panel (Reflexed)     Status: Abnormal   Collection Time: 01/01/19 12:33 AM  Result Value Ref Range Status    Enterococcus species NOT DETECTED NOT DETECTED Final   Listeria monocytogenes NOT DETECTED NOT DETECTED Final   Staphylococcus species DETECTED (A) NOT DETECTED Final    Comment: Methicillin (oxacillin) resistant coagulase negative staphylococcus. Possible blood culture contaminant (unless isolated from more than one blood culture draw or clinical case suggests pathogenicity). No antibiotic treatment is indicated for blood  culture contaminants. CRITICAL RESULT CALLED TO, READ BACK BY AND VERIFIED WITH: PHARMD [facility] [phone] FCP    Staphylococcus aureus (BCID) NOT DETECTED NOT DETECTED Final   Methicillin resistance DETECTED (A) NOT DETECTED Final    Comment: CRITICAL RESULT CALLED TO, READ BACK BY AND VERIFIED WITH: PHARMD [facility] [phone] [id number] FCP    Streptococcus species NOT DETECTED NOT DETECTED Final   Streptococcus agalactiae NOT DETECTED NOT DETECTED Final   Streptococcus pneumoniae NOT DETECTED NOT DETECTED Final   Streptococcus pyogenes NOT DETECTED NOT DETECTED Final   Acinetobacter baumannii NOT DETECTED NOT DETECTED Final   Enterobacteriaceae species NOT DETECTED NOT DETECTED Final   Enterobacter cloacae complex NOT DETECTED NOT DETECTED Final   Escherichia coli NOT DETECTED NOT DETECTED Final   Klebsiella oxytoca NOT DETECTED NOT DETECTED Final   Klebsiella pneumoniae NOT DETECTED NOT DETECTED Final   Proteus species NOT DETECTED NOT DETECTED Final   Serratia marcescens NOT DETECTED NOT DETECTED Final   Haemophilus influenzae NOT DETECTED NOT DETECTED Final   Neisseria meningitidis NOT DETECTED NOT DETECTED Final   Pseudomonas aeruginosa NOT DETECTED NOT DETECTED Final   Candida albicans NOT DETECTED NOT DETECTED Final   Candida glabrata NOT DETECTED NOT DETECTED Final   Candida krusei NOT DETECTED NOT DETECTED Final  Candida parapsilosis NOT DETECTED NOT DETECTED Final   Candida tropicalis NOT DETECTED NOT DETECTED Final  C difficile quick scan w PCR reflex     Status: None    Collection Time: 01/01/19 10:23 AM  Result Value Ref Range Status   C Diff antigen NEGATIVE NEGATIVE Final   C Diff toxin NEGATIVE NEGATIVE Final   C Diff interpretation No C. difficile detected.  Final    Comment: Performed at [facility], [address] [place], [place] [address]         Radiology Studies: No results found.      Scheduled Meds: . cloNIDine  0.3 mg Transdermal Weekly  . enoxaparin (LOVENOX) injection  40 mg Subcutaneous Q24H  . folic acid  1 mg Intravenous Daily  . insulin aspart  0-9 Units Subcutaneous Q4H  . mouth rinse  15 mL Mouth Rinse BID  . pantoprazole (PROTONIX) IV  40 mg Intravenous Q24H  . sodium chloride flush  10-40 mL Intracatheter Q12H  . sodium chloride flush  3 mL Intravenous Q12H  . thiamine injection  100 mg Intravenous Daily   Continuous Infusions: . dextrose 5 % with KCl 20 mEq / L 20 mEq (01/03/19 0930)  . valproate sodium 500 mg (01/03/19 0618)     LOS: 18 days    Time spent: 35 min    Burke Keels, MD [facility]  If 7PM-7AM, please contact night-coverage  01/03/2019, 12:03 PM

## 2019-01-03 NOTE — Telephone Encounter (Signed)
[facility] on the Ipratropium neb solution TP ordered initial neb start at the 3.6.19 office visit  Patient last seen 9.9.19 by RB: Patient Instructions  We will plan to continue Stiolto 2 puffs once daily. Rinse and gargle after using this medication. You may continue to use albuterol 2 puffs or 1 nebulizer treatment up to every 4 hours if needed for shortness of breath, chest tightness, wheezing. Please do not use Atrovent (ipratropium) nebulizers anymore if you still have them. Work hard on decreasing your smoking.  We agreed to try to get down to 7 cigarettes daily by her next visit. Flu and Prevnar 13 pneumonia shots today. Your Pneumovax shot is up-to-date.   Get your lung cancer screening CT scan in February 2020 as planned. Agree with seeing your PCP regarding your dizziness.  Follow with Dr Delton CoombesByrum in 6 months or sooner if you have any problems   The most recent communication with patient in December 2019 was regarding Symbicort: Leslye PeerByrum, Robert S, MD to [facility] Triage Pool 11/01/18 4:44 PM  Note   As you said, he was supposed to be on Stiolto. If he is using stiolto then I don't want him on symbicort.       Fax sent back to [facility] with note that this medication was discontinued.

## 2019-01-03 NOTE — Progress Notes (Signed)
Around 430 am this morning.  Lab work was done on the pt and daughter stated she overheard someone said something on the hall way which  Upset her and also  why was t her dad not being monitored on the big cardiac monitor and was exposed and not covered .  Nurse explained that pt was being monitored on the portable box and cared for, however anytime pt was  He  Continue to t exposed self due to his confusion and delirium however we do cover him any time staff comes in.   Nurse apologized for the comment overheard, service recovery was done She. was reassured that  her  dad is being taken good care of Charge nurse notified as well.

## 2019-01-04 ENCOUNTER — Inpatient Hospital Stay (HOSPITAL_COMMUNITY): Payer: Medicare Other

## 2019-01-04 DIAGNOSIS — R7989 Other specified abnormal findings of blood chemistry: Secondary | ICD-10-CM

## 2019-01-04 DIAGNOSIS — G92 Toxic encephalopathy: Secondary | ICD-10-CM

## 2019-01-04 LAB — GLUCOSE, CAPILLARY
Glucose-Capillary: 102 mg/dL — ABNORMAL HIGH (ref 70–99)
Glucose-Capillary: 104 mg/dL — ABNORMAL HIGH (ref 70–99)
Glucose-Capillary: 75 mg/dL (ref 70–99)
Glucose-Capillary: 85 mg/dL (ref 70–99)
Glucose-Capillary: 90 mg/dL (ref 70–99)
Glucose-Capillary: 90 mg/dL (ref 70–99)
Glucose-Capillary: 98 mg/dL (ref 70–99)

## 2019-01-04 LAB — CBC WITH DIFFERENTIAL/PLATELET
Abs Immature Granulocytes: 0.1 10*3/uL — ABNORMAL HIGH (ref 0.00–0.07)
Band Neutrophils: 1 %
Basophils Absolute: 0 10*3/uL (ref 0.0–0.1)
Basophils Relative: 0 %
Eosinophils Absolute: 0.2 10*3/uL (ref 0.0–0.5)
Eosinophils Relative: 4 %
HCT: 35.1 % — ABNORMAL LOW (ref 39.0–52.0)
Hemoglobin: 11.1 g/dL — ABNORMAL LOW (ref 13.0–17.0)
Lymphocytes Relative: 19 %
Lymphs Abs: 0.8 10*3/uL (ref 0.7–4.0)
MCH: 29.1 pg (ref 26.0–34.0)
MCHC: 31.6 g/dL (ref 30.0–36.0)
MCV: 92.1 fL (ref 80.0–100.0)
Monocytes Absolute: 0.4 10*3/uL (ref 0.1–1.0)
Monocytes Relative: 10 %
Myelocytes: 1 %
Neutro Abs: 2.8 10*3/uL (ref 1.7–7.7)
Neutrophils Relative %: 64 %
Platelets: 359 10*3/uL (ref 150–400)
Promyelocytes Relative: 1 %
RBC: 3.81 MIL/uL — ABNORMAL LOW (ref 4.22–5.81)
RDW: 13.1 % (ref 11.5–15.5)
WBC: 4.3 10*3/uL (ref 4.0–10.5)
nRBC: 0.5 % — ABNORMAL HIGH (ref 0.0–0.2)
nRBC: 1 /100 WBC — ABNORMAL HIGH

## 2019-01-04 LAB — BASIC METABOLIC PANEL
Anion gap: 7 (ref 5–15)
BUN: 6 mg/dL (ref 6–20)
CO2: 29 mmol/L (ref 22–32)
Calcium: 8.3 mg/dL — ABNORMAL LOW (ref 8.9–10.3)
Chloride: 117 mmol/L — ABNORMAL HIGH (ref 98–111)
Creatinine, Ser: 0.62 mg/dL (ref 0.61–1.24)
GFR calc Af Amer: >60 mL/min (ref >60)
GFR calc non Af Amer: >60 mL/min (ref >60)
Glucose, Bld: 101 mg/dL — ABNORMAL HIGH (ref 70–99)
Potassium: 2.9 mmol/L — ABNORMAL LOW (ref 3.5–5.1)
Sodium: 153 mmol/L — ABNORMAL HIGH (ref 135–145)

## 2019-01-04 LAB — VALPROIC ACID LEVEL: Valproic Acid Lvl: 83 ug/mL (ref 50.0–100.0)

## 2019-01-04 LAB — MAGNESIUM: Magnesium: 1.9 mg/dL (ref 1.7–2.4)

## 2019-01-04 MED ORDER — MAGNESIUM SULFATE 2 GM/50ML IV SOLN
2.0000 g | Freq: Once | INTRAVENOUS | Status: AC
  Administered 2019-01-04: 2 g via INTRAVENOUS
  Filled 2019-01-04: qty 50

## 2019-01-04 MED ORDER — POTASSIUM CHLORIDE CRYS ER 20 MEQ PO TBCR: 40.0000 meq | EXTENDED_RELEASE_TABLET | Freq: Two times a day (BID) | ORAL | Status: DC

## 2019-01-04 MED ORDER — POTASSIUM CHLORIDE 2 MEQ/ML IV SOLN: INTRAVENOUS | Status: DC

## 2019-01-04 MED ORDER — POTASSIUM CL IN DEXTROSE 5% 20 MEQ/L IV SOLN: 20.0000 meq | INTRAVENOUS | Status: DC

## 2019-01-04 MED ORDER — MAGNESIUM OXIDE 400 (241.3 MG) MG PO TABS: 400.0000 mg | ORAL_TABLET | Freq: Two times a day (BID) | ORAL | Status: DC

## 2019-01-04 MED ORDER — KETOROLAC TROMETHAMINE 30 MG/ML IJ SOLN
30.0000 mg | Freq: Once | INTRAMUSCULAR | Status: AC
  Administered 2019-01-04: 30 mg via INTRAVENOUS
  Filled 2019-01-04: qty 1

## 2019-01-04 MED ORDER — LORAZEPAM 2 MG/ML IJ SOLN
2.0000 mg | Freq: Once | INTRAMUSCULAR | Status: AC
  Administered 2019-01-04: 2 mg via INTRAVENOUS
  Filled 2019-01-04: qty 1

## 2019-01-04 MED ORDER — KCL IN DEXTROSE-NACL 40-5-0.45 MEQ/L-%-% IV SOLN
INTRAVENOUS | Status: DC
  Administered 2019-01-04 (×2): via INTRAVENOUS
  Filled 2019-01-04 (×3): qty 1000

## 2019-01-04 NOTE — Progress Notes (Signed)
Speech Language Pathology Treatment: Dysphagia  Patient Details Name: Jeffrey [name] MRN: [id number] DOB: [birth year] Today's Date: 01/04/2019 Time: 4034-7425 SLP Time Calculation (min) (ACUTE ONLY): 20 min  Assessment / Plan / Recommendation Clinical Impression  Pt was seen for dysphagia tx with his daughter, [name], present. Pt's daughter reported that the pt has been demonstrating improvement today and has been able to sit up with her assitance. He was adequately alert throughout the session but refused to fully participate in oral care despite max cues from SLP and family. Pt allowed limited oral swabbing and brushing of teeth. With max cues from SLP and pt's daughter, pt accepted trials of puree solids, thin liquids via tsp, and thin liquids via cup. He tolerated 2/3 boluses of puree and 2/2 boluses of thin liquids via tsp without overt s/sx of aspiration. However, coughing was noted with thin liquids via cup. Mild-moderate oral residue was observed with puree and mild-moderate oral holding was noted. Residue was cleared with liquid wash via tsp. SLP will continue to follow to assess consistency in swallow function, his ability to safely tolerate a p.o. diet, and the need for instrumental assessment.    HPI HPI: Pt is a 58 y.o. male with PMH significant for alcohol and polysubstance abuse, COPD, bipolar disorder, and chronic diastolic CHF, presented to ED with poor responsiveness, altered mental status and combativness. He reports to have been out drinking with friends. Pt has had recurring likely alcohol withdrawal seizures during admission. EEG on 1/31 revealed abnormal EEG secondary to general background slowing; finding may be seen with a diffuse disturbance that is etiologically nonspecific, but may include a metabolic encephalopathy or medication effect, among other possibilities. No epileptiform activity was noted.  CXR on 1/27 revealed worsening left greater than right lung opacities  suggesting pneumonia. Intubated 1/24- 2/5.      SLP Plan  Continue with current plan of care       Recommendations  Diet recommendations: NPO Medication Administration: Via alternative means                Oral Care Recommendations: Oral care QID Follow up Recommendations: Skilled Nursing facility SLP Visit Diagnosis: Dysphagia, oropharyngeal phase (R13.12) Plan: Continue with current plan of care       Lilie Vezina I. Vear Clock, MS, CCC-SLP Acute Rehabilitation Services Office number [phone] Pager [phone]               [name] 01/04/2019, 5:11 PM

## 2019-01-04 NOTE — Progress Notes (Signed)
Will notify lab to draw blood for lab as the midline is not having any blood return.

## 2019-01-04 NOTE — Progress Notes (Signed)
EEG completed, results pending. 

## 2019-01-04 NOTE — Procedures (Signed)
History: 58 year old male being evaluated for encephalopathy  Sedation: None  Technique: This is a 21 channel routine scalp EEG performed at the bedside with bipolar and monopolar montages arranged in accordance to the international 10/20 system of electrode placement. One channel was dedicated to EKG recording.    Background: The background consists predominantly of delta and theta activities.  There is a posterior dominant rhythm of 6 to 7 Hz, which is seen throughout the recording.  There is no clear sleep recorded.  Photic stimulation: Physiologic driving is performed  EEG Abnormalities: 1) generalized irregular slow activity 2) slow posterior dominant rhythm  Clinical Interpretation: This EEG is consistent with a generalized nonspecific cerebral dysfunction (encephalopathy). There was no seizure or seizure predisposition recorded on this study. Please note that lack of epileptiform activity on EEG does not preclude the possibility of epilepsy.   Ritta Slot, MD [facility] [phone]  If 7pm- 7am, please page neurology on call as listed in AMION.

## 2019-01-04 NOTE — Progress Notes (Signed)
Occupational Therapy Treatment Patient Details Name: Jeffrey [name] MRN: [id number] DOB: 04/19/1961 Today's Date: 01/04/2019    History of present illness Pt is a 58 year old man admitted 12/16/18 with respiratory distress due to PNA. Intubated 1/24-2/5. Pt with ETOH withdrawal seizure on 1/27. +cocaine upon admission. PMH: bipolar, COPD, alcohol abuse, HTN, [name], depression, CHF.   OT comments  Pt's father in room throughout session. Performed mouth care as pt with thick secretions. Pt sat EOB with min to min guard assist x 15 min. Stood x 2 with max assist from EOB. Pt nodding head inconsistently to yes/no questions. Improved initiation of mobility.   Follow Up Recommendations  SNF;Supervision/Assistance - 24 hour    Equipment Recommendations       Recommendations for Other Services      Precautions / Restrictions Precautions Precautions: Fall       Mobility Bed Mobility Overal bed mobility: Needs Assistance Bed Mobility: Supine to Sit     Supine to sit: Max assist Sit to supine: Max assist   General bed mobility comments: assist for LEs and to raise trunk, pt demonstrating initiation of supine<>sit  Transfers Overall transfer level: Needs assistance Equipment used: 1 person hand held assist Transfers: Sit to/from Stand Sit to Stand: Max assist         General transfer comment: pt initiating standing from EOB, performed x 2 with max assist    Balance Overall balance assessment: Needs assistance Sitting-balance support: Bilateral upper extremity supported;No upper extremity supported Sitting balance-Leahy Scale: Poor Sitting balance - Comments: min guard to min assist required x 15 minutes     Standing balance-Leahy Scale: Poor                             ADL either performed or assessed with clinical judgement   ADL                                         General ADL Comments: provided oral care with total assist      Vision       Perception     Praxis      Cognition Arousal/Alertness: Awake/alert Behavior During Therapy: Flat affect Overall Cognitive Status: Impaired/Different from baseline Area of Impairment: Following commands;Safety/judgement                       Following Commands: Follows one step commands inconsistently Safety/Judgement: Decreased awareness of safety;Decreased awareness of deficits              Exercises     Shoulder Instructions       General Comments      Pertinent Vitals/ Pain       Pain Assessment: Faces Faces Pain Scale: No hurt  Home Living                                          Prior Functioning/Environment              Frequency  Min 2X/week        Progress Toward Goals  OT Goals(current goals can now be found in the care plan section)  Progress towards OT goals: Progressing toward goals  Acute Rehab OT Goals  Patient Stated Goal: none stated OT Goal Formulation: Patient unable to participate in goal setting Time For Goal Achievement: 01/13/19 Potential to Achieve Goals: Fair  Plan Discharge plan remains appropriate    Co-evaluation                 AM-PAC OT "6 Clicks" Daily Activity     Outcome Measure   Help from another person eating meals?: Total Help from another person taking care of personal grooming?: Total Help from another person toileting, which includes using toliet, bedpan, or urinal?: Total Help from another person bathing (including washing, rinsing, drying)?: Total Help from another person to put on and taking off regular upper body clothing?: Total Help from another person to put on and taking off regular lower body clothing?: Total 6 Click Score: 6    End of Session Equipment Utilized During Treatment: Oxygen  OT Visit Diagnosis: Other symptoms and signs involving cognitive function;Muscle weakness (generalized) (M62.81)   Activity Tolerance Patient tolerated  treatment well   Patient Left in bed;with call bell/phone within reach;with bed alarm set;with family/visitor present   Nurse Communication Mobility status(aware condom cath is off)        Time: 1329-1405 OT Time Calculation (min): 36 min  Charges: OT General Charges $OT Visit: 1 Visit OT Treatments $Therapeutic Activity: 23-37 mins  Martie Round, OTR/L Acute Rehabilitation Services Pager: [phone] Office: [phone]   Evern Bio 01/04/2019, 2:11 PM

## 2019-01-04 NOTE — Progress Notes (Signed)
Reason for consult: Severe encephalopathy, more lethargic  Subjective: Patient lying in bed, is nonverbal and not following any commands.   ROS:  Unable to obtain due to poor mental status  Examination  Vital signs in last 24 hours: Temp:  [98.5 F (36.9 C)-99.4 F (37.4 C)] 99.4 F (37.4 C) (02/12 1212) Pulse Rate:  [102-113] 102 (02/12 1212) Resp:  [18] 18 (02/12 1212) BP: (125-130)/(69-76) 127/71 (02/12 1212) SpO2:  [96 %-100 %] 100 % (02/12 1212) Weight:  [57.2 kg] 57.2 kg (02/12 0500)  General: lying in bed CVS: pulse-normal rate and rhythm RS: breathing comfortably Extremities: normal   Neuro: MS: Awake, eyes are open but not following commands CN: pupils equal and reactive,  EOMI, no nystagmus, face symmetric, tongue midline,  Motor: Good strength in all 4 extremities, moving them spontaneously Reflexes: 2+ bilaterally over patella, biceps, plantars: flexor Coordination: unable to assess Gait: not tested  Basic Metabolic Panel: Recent Labs  Lab 12/29/18 0322  12/30/18 0655 12/31/18 0433  01/01/19 1031 01/02/19 0611 01/03/19 0446 01/03/19 1307 01/04/19 0551 01/04/19 1012  NA 143   < > 143 145   < > 146* 152* 155* 155* 153*  --   K 3.3*   < > 3.3* 2.8*   < > 2.5* 3.4* 3.1* 3.4* 2.9*  --   CL 104   < > 99 103   < > 108 117* 123* 122* 117*  --   CO2 26   < > 27 32   < > 22 22 23 22 29   --   GLUCOSE 94   < > 93 85   < > 104* 130* 115* 108* 101*  --   BUN 7   < > 8 8   < > 18 13 8 8 6   --   CREATININE 0.68   < > 0.73 0.65   < > 0.76 0.61 0.75 0.78 0.62  --   CALCIUM 8.0*   < > 8.4* 8.1*   < > 8.4* 8.2* 8.6* 8.6* 8.3*  --   MG 1.8  --  1.6* 1.9  --   --  2.2 2.0  --   --  1.9  PHOS 3.3  --  2.5  --   --   --   --   --   --   --   --    < > = values in this interval not displayed.    CBC: Recent Labs  Lab 12/29/18 0322 12/31/18 0757 01/01/19 0033 01/02/19 0611 01/03/19 0446 01/04/19 0551  WBC 11.0* 4.3 4.2 4.2 5.2 4.3  NEUTROABS 9.9*  --   --  3.1   --  2.8  HGB 10.4* 9.8* 12.1* 11.1* 10.7* 11.1*  HCT 32.1* 31.2* 36.6* 36.0* 34.6* 35.1*  MCV 89.4 91.0 90.8 92.1 91.8 92.1  PLT 343 310 362 329 382 359     Coagulation Studies: No results for input(s): LABPROT, INR in the last 72 hours.  Imaging Reviewed:     ASSESSMENT AND PLAN  58 year old male with past medical history of alcohol abuse substance abuse admitted for likely withdrawal seizure, delirium tremens.  Patient also started emeprically on high-dose thiamine 5 mg 3 times daily for 3 days.  EEG was negative for any epileptiform discharges.  A brain was performed which is negative for any acute abnormality.  Was last seen by neurology on 12/25/18.  At the time patient was intubated.  He was extubated on 2/5.  Upon reviewing notes,  patient has not talking much and has been very confused since extubation.  He is also on Depakote for history of bipolar disorder and last level was checked today and was therapeutic at 83.  Ammonia was checked yesterday and was slightly elevated at 46.  Neurology was called today as patient was more lethargic than his baseline.  On my assessment patient was awake and moving all 4 extremities spontaneously, but nonverbal and not following any commands.  Possible Wernicke encephalopathy  Recommendations -Repeat MRI brain -Repeat EEG to r/o subclinical seizures - repeat ammonia level  -Patient already received high-dose thiamine  Tristyn Demarest [facility] Pager Number [phone] For questions after 7pm please refer to AMION to reach the Neurologist on call

## 2019-01-04 NOTE — Progress Notes (Signed)
[facility] PROGRESS NOTE    Progress Note  Jeffrey [name]  ZOX:[id number] DOB: 05/23/1961 DOA: 12/16/2018 PCP: Gareth MorganKnowlton, Steve, MD     Brief Narrative:   Jeffrey [name] is an 58 y.o. male past medical history of bipolar disorder, COPD diastolic heart failure and polysubstance abuse who presents with respiratory failure found to have Klebsiella pneumonia was intubated on 12/16/2018 extubated on 12/28/2018 and she has completed her course of antibiotics, she also had alcohol withdrawal seizures on 12/19/2018 and then again on 12/22/2018  Assessment/Plan:   Acute Toxic encephalopathy/ Alcohol withdrawal syndrome, with delirium (HCC): CT head on 12/31/2018 show no acute findings. MRI brain on 12/22/2018 and was unrevealing. He was treated initially with high-dose thiamine. Encephalopathy is thought to be related to alcohol plus or minus medication. Continue thiamine and folate. Concern about Wernicke's/Korsakoff encephalopathy. We will reconsult neurology. At home he was on Depakote 1500 a day, here in the hospital he was placed on Depakote 500 mg 3 times a day, this can contribute to confusion encephalopathy will hold this medication. Check a depakote level.  Alcohol use: Continue Ativan protocol.  Diarrhea: C. difficile negative.  COPD, severe (HCC) Well compensated.  Bipolar disorder with severe depression (HCC) Continue Depacon.  Chronic diastolic CHF (congestive heart failure) (HCC) Appears to be euvolemic.  Acute respiratory failure with hypoxemia (HCC) Derry to pneumonia status post extubation.  Hypernatremia: There is a free water deficit, change IV fluids to D5 half-normal.  Continue at higher rate, recheck basic metabolic panel in the morning.  Hypokalemia: Continue replete potassium IV.  The patient is currently n.p.o.  DVT prophylaxis: lovenox Family Communication:none Disposition Plan/Barrier to D/C: unable to determine Code Status:     Code Status  Orders  (From admission, onward)         Start     Ordered   12/16/18 1213  Full code  Continuous     12/16/18 1216        Code Status History    Date Active Date Inactive Code Status Order ID Comments User Context   12/16/2018 0429 12/16/2018 1216 Full Code [id number]  Briscoe Deutscherpyd, Timothy S, MD ED   09/16/2018 1536 09/17/2018 1342 Full Code [id number]  Maurilio LovelyShah, Pratik D, DO ED   06/16/2018 0750 06/17/2018 1356 DNR [id number]  Meredeth IdeLama, Gagan S, MD Inpatient   01/31/2018 2049 02/08/2018 2202 Full Code [id number]  Kerry HoughSimon, Spencer E, PA-C Inpatient   01/31/2018 0533 01/31/2018 1850 Full Code [id number]  Zadie RhineWickline, Donald, MD ED   04/07/2017 2311 04/15/2017 1357 Full Code [id number]  Alberteen Samanford, Christopher P, MD Inpatient   02/07/2017 2313 02/10/2017 1645 Full Code [id number]  Haydee Monicaavid, Rachal A, MD Inpatient        IV Access:    Peripheral IV   Procedures and diagnostic studies:   No results found.   Medical Consultants:    None.  Anti-Infectives:   None  Subjective:    Jeffrey [name] lethargic nonverbal.  Objective:    Vitals:   01/03/19 1524 01/04/19 0500 01/04/19 0508 01/04/19 0737  BP: 130/76  125/72 130/69  Pulse: (!) 110  (!) 113 (!) 107  Resp: 18   18  Temp: 98.5 F (36.9 C)   98.8 F (37.1 C)  TempSrc: Axillary   Oral  SpO2: 98%  96% 98%  Weight:  57.2 kg    Height:        Intake/Output Summary (Last 24 hours) at 01/04/2019 [id number] Last data filed at 01/03/2019 1800 Gross per  24 hour  Intake -  Output 900 ml  Net -900 ml   Filed Weights   01/01/19 0504 01/02/19 0306 01/04/19 0500  Weight: 60.1 kg 60.9 kg 57.2 kg    Exam: General exam: In no acute distress. Respiratory system: Good air movement and clear to auscultation. Cardiovascular system: S1 & S2 heard, RRR. Marland Kitchen.  Gastrointestinal system: Abdomen is nondistended, soft and nontender.  Central nervous system: Moving all 4 extremities not following commands. Extremities: No pedal edema. Skin: No rashes, lesions or  ulcers   Data Reviewed:    Labs: Basic Metabolic Panel: Recent Labs  Lab 12/29/18 0322  12/30/18 0655 12/31/18 0433  01/01/19 1031 01/02/19 0611 01/03/19 0446 01/03/19 1307 01/04/19 0551  NA 143   < > 143 145   < > 146* 152* 155* 155* 153*  K 3.3*   < > 3.3* 2.8*   < > 2.5* 3.4* 3.1* 3.4* 2.9*  CL 104   < > 99 103   < > 108 117* 123* 122* 117*  CO2 26   < > 27 32   < > 22 22 23 22 29   GLUCOSE 94   < > 93 85   < > 104* 130* 115* 108* 101*  BUN 7   < > 8 8   < > 18 13 8 8 6   CREATININE 0.68   < > 0.73 0.65   < > 0.76 0.61 0.75 0.78 0.62  CALCIUM 8.0*   < > 8.4* 8.1*   < > 8.4* 8.2* 8.6* 8.6* 8.3*  MG 1.8  --  1.6* 1.9  --   --  2.2 2.0  --   --   PHOS 3.3  --  2.5  --   --   --   --   --   --   --    < > = values in this interval not displayed.   GFR Estimated Creatinine Clearance: 81.4 mL/min (by C-G formula based on SCr of 0.62 mg/dL). Liver Function Tests: Recent Labs  Lab 01/02/19 0611 01/03/19 0446  AST 18 17  ALT 22 19  ALKPHOS 135* 125  BILITOT 0.5 0.7  PROT 5.7* 5.7*  ALBUMIN 2.1* 2.0*   No results for input(s): LIPASE, AMYLASE in the last 168 hours. Recent Labs  Lab 12/31/18 0757 01/03/19 0446  AMMONIA 49* 46*   Coagulation profile No results for input(s): INR, PROTIME in the last 168 hours.  CBC: Recent Labs  Lab 12/29/18 0322 12/31/18 0757 01/01/19 0033 01/02/19 0611 01/03/19 0446 01/04/19 0551  WBC 11.0* 4.3 4.2 4.2 5.2 4.3  NEUTROABS 9.9*  --   --  3.1  --  2.8  HGB 10.4* 9.8* 12.1* 11.1* 10.7* 11.1*  HCT 32.1* 31.2* 36.6* 36.0* 34.6* 35.1*  MCV 89.4 91.0 90.8 92.1 91.8 92.1  PLT 343 310 362 329 382 359   Cardiac Enzymes: No results for input(s): CKTOTAL, CKMB, CKMBINDEX, TROPONINI in the last 168 hours. BNP (last 3 results) No results for input(s): PROBNP in the last 8760 hours. CBG: Recent Labs  Lab 01/03/19 1609 01/03/19 2028 01/04/19 0039 01/04/19 0458 01/04/19 0736  GLUCAP 104* 97 75 85 98   D-Dimer: No results for  input(s): DDIMER in the last 72 hours. Hgb A1c: No results for input(s): HGBA1C in the last 72 hours. Lipid Profile: No results for input(s): CHOL, HDL, LDLCALC, TRIG, CHOLHDL, LDLDIRECT in the last 72 hours. Thyroid function studies: No results for input(s): TSH, T4TOTAL, T3FREE, THYROIDAB  in the last 72 hours.  Invalid input(s): FREET3 Anemia work up: No results for input(s): VITAMINB12, FOLATE, FERRITIN, TIBC, IRON, RETICCTPCT in the last 72 hours. Sepsis Labs: Recent Labs  Lab 01/01/19 0033 01/02/19 0611 01/03/19 0446 01/04/19 0551  WBC 4.2 4.2 5.2 4.3   Microbiology Recent Results (from the past 240 hour(s))  Culture, Urine     Status: Abnormal   Collection Time: 12/31/18 11:26 PM  Result Value Ref Range Status   Specimen Description URINE, RANDOM  Final   Special Requests   Final    NONE Performed at [facility], [address], [place], [place] [address]    Culture STAPHYLOCOCCUS EPIDERMIDIS (A)  Final   Report Status 01/03/2019 FINAL  Final   Organism ID, Bacteria STAPHYLOCOCCUS EPIDERMIDIS  Final      Susceptibility   Staphylococcus epidermidis - MIC*    CIPROFLOXACIN <=0.5 SENSITIVE Sensitive     GENTAMICIN <=0.5 SENSITIVE Sensitive     NITROFURANTOIN 64 INTERMEDIATE Intermediate     OXACILLIN >=4 RESISTANT Resistant     TETRACYCLINE <=1 SENSITIVE Sensitive     VANCOMYCIN <=0.5 SENSITIVE Sensitive     TRIMETH/SULFA <=10 SENSITIVE Sensitive     CLINDAMYCIN >=8 RESISTANT Resistant     RIFAMPIN 16 RESISTANT Resistant     Inducible Clindamycin NEGATIVE Sensitive     * STAPHYLOCOCCUS EPIDERMIDIS  Culture, blood (routine x 2)     Status: None (Preliminary result)   Collection Time: 01/01/19 12:33 AM  Result Value Ref Range Status   Specimen Description BLOOD LEFT ANTECUBITAL  Final   Special Requests   Final    BOTTLES DRAWN AEROBIC AND ANAEROBIC Blood Culture results may not be optimal due to an inadequate volume of blood received in culture bottles    Culture   Final    NO GROWTH 3 DAYS Performed at [facility], [address] [place], [place] [address]    Report Status PENDING  Incomplete  Culture, blood (routine x 2)     Status: Abnormal   Collection Time: 01/01/19 12:33 AM  Result Value Ref Range Status   Specimen Description BLOOD LEFT HAND  Final   Special Requests   Final    BOTTLES DRAWN AEROBIC AND ANAEROBIC Blood Culture adequate volume   Culture  Setup Time   Final    GRAM POSITIVE COCCI AEROBIC BOTTLE ONLY CRITICAL RESULT CALLED TO, READ BACK BY AND VERIFIED WITH: PHARMD [facility] [id number] FCP    Culture (A)  Final    STAPHYLOCOCCUS SPECIES (COAGULASE NEGATIVE) THE SIGNIFICANCE OF ISOLATING THIS ORGANISM FROM A SINGLE SET OF BLOOD CULTURES WHEN MULTIPLE SETS ARE DRAWN IS UNCERTAIN. PLEASE NOTIFY THE MICROBIOLOGY DEPARTMENT WITHIN ONE WEEK IF SPECIATION AND SENSITIVITIES ARE REQUIRED. Performed at [facility], [address] [place], [place] [address]    Report Status 01/03/2019 FINAL  Final  Blood Culture ID Panel (Reflexed)     Status: Abnormal   Collection Time: 01/01/19 12:33 AM  Result Value Ref Range Status   Enterococcus species NOT DETECTED NOT DETECTED Final   Listeria monocytogenes NOT DETECTED NOT DETECTED Final   Staphylococcus species DETECTED (A) NOT DETECTED Final    Comment: Methicillin (oxacillin) resistant coagulase negative staphylococcus. Possible blood culture contaminant (unless isolated from more than one blood culture draw or clinical case suggests pathogenicity). No antibiotic treatment is indicated for blood  culture contaminants. CRITICAL RESULT CALLED TO, READ BACK BY AND VERIFIED WITH: [facility] [id number] FCP  Staphylococcus aureus (BCID) NOT DETECTED NOT DETECTED Final   Methicillin resistance DETECTED (A) NOT DETECTED Final    Comment: CRITICAL RESULT CALLED TO, READ BACK BY AND VERIFIED WITH: PH[facility]MD [facility] [id number] FCP    Streptococcus species NOT DETECTED NOT  DETECTED Final   Streptococcus agalactiae NOT DETECTED NOT DETECTED Final   Streptococcus pneumoniae NOT DETECTED NOT DETECTED Final   Streptococcus pyogenes NOT DETECTED NOT DETECTED Final   Acinetobacter baumannii NOT DETECTED NOT DETECTED Final   Enterobacteriaceae species NOT DETECTED NOT DETECTED Final   Enterobacter cloacae complex NOT DETECTED NOT DETECTED Final   Escherichia coli NOT DETECTED NOT DETECTED Final   Klebsiella oxytoca NOT DETECTED NOT DETECTED Final   Klebsiella pneumoniae NOT DETECTED NOT DETECTED Final   Proteus species NOT DETECTED NOT DETECTED Final   Serratia marcescens NOT DETECTED NOT DETECTED Final   Haemophilus influenzae NOT DETECTED NOT DETECTED Final   Neisseria meningitidis NOT DETECTED NOT DETECTED Final   Pseudomonas aeruginosa NOT DETECTED NOT DETECTED Final   Candida albicans NOT DETECTED NOT DETECTED Final   Candida glabrata NOT DETECTED NOT DETECTED Final   Candida krusei NOT DETECTED NOT DETECTED Final   Candida parapsilosis NOT DETECTED NOT DETECTED Final   Candida tropicalis NOT DETECTED NOT DETECTED Final  C difficile quick scan w PCR reflex     Status: None   Collection Time: 01/01/19 10:23 AM  Result Value Ref Range Status   C Diff antigen NEGATIVE NEGATIVE Final   C Diff toxin NEGATIVE NEGATIVE Final   C Diff interpretation No C. difficile detected.  Final    Comment: Performed at [facility], [address], [place], [place] [address]  Culture, blood (routine x 2)     Status: None (Preliminary result)   Collection Time: 01/03/19 12:38 PM  Result Value Ref Range Status   Specimen Description BLOOD RIGHT HAND  Final   Special Requests   Final    BOTTLES DRAWN AEROBIC AND ANAEROBIC Blood Culture results may not be optimal due to an inadequate volume of blood received in culture bottles   Culture   Final    NO GROWTH < 24 HOURS Performed at [facility], [address] [place], [place] [address]    Report Status PENDING   Incomplete  Culture, blood (routine x 2)     Status: None (Preliminary result)   Collection Time: 01/03/19 12:38 PM  Result Value Ref Range Status   Specimen Description BLOOD RIGHT ARM  Final   Special Requests   Final    BOTTLES DRAWN AEROBIC ONLY Blood Culture results may not be optimal due to an inadequate volume of blood received in culture bottles   Culture   Final    NO GROWTH < 24 HOURS Performed at [facility], [address], [place], [place] [address]    Report Status PENDING  Incomplete     Medications:   . chlorhexidine  15 mL Mouth Rinse BID  . cloNIDine  0.3 mg Transdermal Weekly  . enoxaparin (LOVENOX) injection  40 mg Subcutaneous Q24H  . folic acid  1 mg Intravenous Daily  . insulin aspart  0-9 Units Subcutaneous Q4H  . mouth rinse  15 mL Mouth Rinse BID  . mouth rinse  15 mL Mouth Rinse q12n4p  . pantoprazole (PROTONIX) IV  40 mg Intravenous Q24H  . sodium chloride flush  10-40 mL Intracatheter Q12H  . sodium chloride flush  3 mL Intravenous Q12H  .  thiamine injection  100 mg Intravenous Daily   Continuous Infusions: . dextrose 5 % with KCl 20 mEq / L 20 mEq (01/04/19 0250)  . valproate sodium 500 mg (01/04/19 0513)      LOS: 19 days   Marinda Elk  [facility]  01/04/2019, 9:23 AM

## 2019-01-05 ENCOUNTER — Inpatient Hospital Stay (HOSPITAL_COMMUNITY): Payer: Medicare Other

## 2019-01-05 LAB — GLUCOSE, CAPILLARY
Glucose-Capillary: 70 mg/dL (ref 70–99)
Glucose-Capillary: 73 mg/dL (ref 70–99)
Glucose-Capillary: 75 mg/dL (ref 70–99)
Glucose-Capillary: 77 mg/dL (ref 70–99)
Glucose-Capillary: 78 mg/dL (ref 70–99)
Glucose-Capillary: 79 mg/dL (ref 70–99)

## 2019-01-05 LAB — CBC WITH DIFFERENTIAL/PLATELET
Abs Immature Granulocytes: 0.2 10*3/uL — ABNORMAL HIGH (ref 0.00–0.07)
Basophils Absolute: 0 10*3/uL (ref 0.0–0.1)
Basophils Relative: 0 %
Eosinophils Absolute: 0.2 10*3/uL (ref 0.0–0.5)
Eosinophils Relative: 4 %
HCT: 34.6 % — ABNORMAL LOW (ref 39.0–52.0)
Hemoglobin: 10.7 g/dL — ABNORMAL LOW (ref 13.0–17.0)
Lymphocytes Relative: 19 %
Lymphs Abs: 0.9 10*3/uL (ref 0.7–4.0)
MCH: 28.5 pg (ref 26.0–34.0)
MCHC: 30.9 g/dL (ref 30.0–36.0)
MCV: 92.3 fL (ref 80.0–100.0)
Monocytes Absolute: 0.3 10*3/uL (ref 0.1–1.0)
Monocytes Relative: 6 %
Myelocytes: 3 %
Neutro Abs: 3.2 10*3/uL (ref 1.7–7.7)
Neutrophils Relative %: 67 %
Platelets: 391 10*3/uL (ref 150–400)
Promyelocytes Relative: 1 %
RBC: 3.75 MIL/uL — ABNORMAL LOW (ref 4.22–5.81)
RDW: 12.8 % (ref 11.5–15.5)
WBC: 4.8 10*3/uL (ref 4.0–10.5)
nRBC: 0 /100 WBC
nRBC: 0.4 % — ABNORMAL HIGH (ref 0.0–0.2)

## 2019-01-05 LAB — BASIC METABOLIC PANEL
Anion gap: 12 (ref 5–15)
Anion gap: 4 — ABNORMAL LOW (ref 5–15)
BUN: 7 mg/dL (ref 6–20)
BUN: 6 mg/dL (ref 6–20)
CO2: 24 mmol/L (ref 22–32)
CO2: 29 mmol/L (ref 22–32)
Calcium: 8.5 mg/dL — ABNORMAL LOW (ref 8.9–10.3)
Calcium: 8.5 mg/dL — ABNORMAL LOW (ref 8.9–10.3)
Chloride: 116 mmol/L — ABNORMAL HIGH (ref 98–111)
Chloride: 118 mmol/L — ABNORMAL HIGH (ref 98–111)
Creatinine, Ser: 1.75 mg/dL — ABNORMAL HIGH (ref 0.61–1.24)
Creatinine, Ser: 0.77 mg/dL (ref 0.61–1.24)
GFR calc Af Amer: 49 mL/min — ABNORMAL LOW (ref >60)
GFR calc Af Amer: >60 mL/min (ref >60)
GFR calc non Af Amer: 42 mL/min — ABNORMAL LOW (ref >60)
GFR calc non Af Amer: >60 mL/min (ref >60)
Glucose, Bld: 100 mg/dL — ABNORMAL HIGH (ref 70–99)
Glucose, Bld: 110 mg/dL — ABNORMAL HIGH (ref 70–99)
Potassium: 4.6 mmol/L (ref 3.5–5.1)
Potassium: 4.2 mmol/L (ref 3.5–5.1)
Sodium: 152 mmol/L — ABNORMAL HIGH (ref 135–145)
Sodium: 151 mmol/L — ABNORMAL HIGH (ref 135–145)

## 2019-01-05 LAB — AMMONIA: Ammonia: 25 umol/L (ref 9–35)

## 2019-01-05 MED ORDER — LORAZEPAM 2 MG/ML IJ SOLN
2.0000 mg | Freq: Once | INTRAMUSCULAR | Status: AC
  Administered 2019-01-05: 2 mg via INTRAVENOUS
  Filled 2019-01-05: qty 1

## 2019-01-05 MED ORDER — DEXTROSE 5 % IV SOLN: INTRAVENOUS | Status: DC

## 2019-01-05 MED ORDER — DEXTROSE-NACL 5-0.45 % IV SOLN
INTRAVENOUS | Status: DC
  Administered 2019-01-05 – 2019-01-10 (×6): via INTRAVENOUS

## 2019-01-05 MED ORDER — DIVALPROEX SODIUM 250 MG PO DR TAB
500.0000 mg | DELAYED_RELEASE_TABLET | Freq: Three times a day (TID) | ORAL | Status: DC
  Filled 2019-01-05: qty 2

## 2019-01-05 MED ORDER — VALPROATE SODIUM 500 MG/5ML IV SOLN
500.0000 mg | Freq: Three times a day (TID) | INTRAVENOUS | Status: DC
  Administered 2019-01-05 – 2019-01-08 (×9): 500 mg via INTRAVENOUS
  Filled 2019-01-05 (×12): qty 5

## 2019-01-05 MED ORDER — MORPHINE SULFATE (PF) 2 MG/ML IV SOLN
2.0000 mg | INTRAVENOUS | Status: DC | PRN
  Administered 2019-01-05 – 2019-01-08 (×11): 2 mg via INTRAVENOUS
  Filled 2019-01-05 (×11): qty 1

## 2019-01-05 NOTE — Progress Notes (Signed)
Speech Language Pathology Treatment: Dysphagia  Patient Details Name: Jeffrey [name] MRN: [id number] DOB: December 05, 1960 Today's Date: 01/05/2019 Time: 4098-1191 SLP Time Calculation (min) (ACUTE ONLY): 25 min  Assessment / Plan / Recommendation Clinical Impression  Pt had recently received pain medication on SLP arrival and would not accept PO trials today.  Daughter [name] reports pt accepted applesauce and water from her.  She noted cough with cup sips of thin liquid, but good tolerance of thin liquid by spoon and puree.  Provided extensive family education regarding s/s of dysphagia, dysphagia assessment and treatment, role of SLP, and discussed plan of care and possible decisions that would need to be made depending on pt's swallowing function.  Family is open to alternate means of nutrition at this time.  It would be beneficial, however, to have instrumental assessment of swallowing function to inform any decision making around long term alternate means of nutrition.  Daughter states at this time that if her father does not make any further meaningful recovery that she still has her father and that is enough for her.  Pt was able to accept PO trials yesterday during therapy session, and per report accepted PO trials with family today.  Will tentatively plan on MBSS next date provided that pt is able to participate.  Family is agreeable to continuing NPO status pending instrumental assessment. Daughter [name] would like to be present for assessment tomorrow and can be reached by phone.   HPI HPI: Pt is a 58 y.o. male with PMH significant for alcohol and polysubstance abuse, COPD, bipolar disorder, and chronic diastolic CHF, presented to ED with poor responsiveness, altered mental status and combativness. He reports to have been out drinking with friends. Pt has had recurring likely alcohol withdrawal seizures during admission. EEG on 1/31 revealed abnormal EEG secondary to general background slowing;  finding may be seen with a diffuse disturbance that is etiologically nonspecific, but may include a metabolic encephalopathy or medication effect, among other possibilities. No epileptiform activity was noted. Intubated 1/24- 2/5.  CXR 2/8 concerning for multilobar bronchopneumonia.  Head CT 2/8 no acute findings      SLP Plan  Continue with current plan of care       Recommendations  Diet recommendations: NPO Medication Administration: Via alternative means                SLP Visit Diagnosis: Dysphagia, oropharyngeal phase (R13.12) Plan: Continue with current plan of care       GO                Kerrie Pleasure, MA, CCC-SLP Acute Rehabilitation Services Office: [phone]; Pager (2/13): [phone] 01/05/2019, 3:12 PM

## 2019-01-05 NOTE — Progress Notes (Signed)
SLP Cancellation Note  Patient Details Name: Jeffrey [name] MRN: [id number] DOB: 1961/04/10   Cancelled treatment:     Attempted to see pt for PO trials to determine readiness for instrumenta evaluation/PO intake.  Pt unavailable at time of SLP attempt for family meeting with physician.  SLP will reattempt as schedule permits.      [name] 01/05/2019, 1:29 PM

## 2019-01-05 NOTE — Progress Notes (Signed)
[facility] PROGRESS NOTE    Progress Note  Jeffrey Griffondward T Mohrmann  XBM:[id number] DOB: 08-Jun-1961 DOA: 12/16/2018 PCP: Gareth MorganKnowlton, Steve, MD     Brief Narrative:   Jeffrey [name] is an 58 y.o. male past medical history of bipolar disorder, COPD diastolic heart failure and polysubstance abuse who presents with respiratory failure found to have Klebsiella pneumonia was intubated on 12/16/2018 extubated on 12/28/2018 and she has completed her course of antibiotics, she also had alcohol withdrawal seizures on 12/19/2018 and then again on 12/22/2018  Assessment/Plan:   Acute Toxic encephalopathy/ Alcohol withdrawal syndrome, with delirium (HCC): CT head on 12/31/2018 show no acute findings. MRI brain on 12/22/2018 and was unrevealing. He was treated initially with high-dose thiamine, with minimal improvement. Encephalopathy is thought to be related to alcohol. Continue thiamine and folate. Concern about Wernicke's/Korsakoff encephalopathy. Depakote level is therapeutic, his resume his Depakote dose. Neurology was consulted they recommended to repeat an MRI. Will discuss with family about goals of care today.  Alcohol use: Continue Ativan protocol. No signs of withdrawal.  Diarrhea: C. difficile negative.  COPD, severe (HCC) Well compensated.  Bipolar disorder with severe depression (HCC) Continue Depacon.  Chronic diastolic CHF (congestive heart failure) (HCC) Appears to be euvolemic.  Acute respiratory failure with hypoxemia (HCC) Due to pneumonia status post extubation.  Hypernatremia: There is a free water deficit, continues to improve with IV fluids. We will change to D5W.  Hypokalemia: Repleted IV now improved.  New acute kidney injury: Check urinary sodium and urinary creatinine.  Check a basic metabolic panel. Change IV fluids to D5 and half-normal saline. I's and O's have been recorded poorly, monitor strict I's and O's.   DVT prophylaxis: lovenox Family  Communication:none Disposition Plan/Barrier to D/C: unable to determine Code Status:     Code Status Orders  (From admission, onward)         Start     Ordered   12/16/18 1213  Full code  Continuous     12/16/18 1216        Code Status History    Date Active Date Inactive Code Status Order ID Comments User Context   12/16/2018 0429 12/16/2018 1216 Full Code [id number]  Briscoe Deutscherpyd, Timothy S, MD ED   09/16/2018 1536 09/17/2018 1342 Full Code [id number]  Maurilio LovelyShah, Pratik D, DO ED   06/16/2018 0750 06/17/2018 1356 DNR [id number]  Meredeth IdeLama, Gagan S, MD Inpatient   01/31/2018 2049 02/08/2018 2202 Full Code [id number]  Kerry HoughSimon, Spencer E, PA-C Inpatient   01/31/2018 0533 01/31/2018 1850 Full Code [id number]  Zadie RhineWickline, Donald, MD ED   04/07/2017 2311 04/15/2017 1357 Full Code [id number]  Alberteen Samanford, Christopher P, MD Inpatient   02/07/2017 2313 02/10/2017 1645 Full Code [id number]  Haydee Monicaavid, Rachal A, MD Inpatient        IV Access:    Peripheral IV   Procedures and diagnostic studies:   No results found.   Medical Consultants:    None.  Anti-Infectives:   None  Subjective:    Rosealee Albeedward T Brittingham lethargic nonverbal.  Objective:    Vitals:   01/04/19 0737 01/04/19 1212 01/04/19 2001 01/05/19 0800  BP: 130/69 127/71 126/89 136/75  Pulse: (!) 107 (!) 102 (!) 106 75  Resp: 18 18 (!) 22 18  Temp: 98.8 F (37.1 C) 99.4 F (37.4 C) 99.3 F (37.4 C) 98.1 F (36.7 C)  TempSrc: Oral Oral Axillary Axillary  SpO2: 98% 100% 100% 100%  Weight:      Height:  Intake/Output Summary (Last 24 hours) at 01/05/2019 1023 Last data filed at 01/05/2019 0754 Gross per 24 hour  Intake 1159.86 ml  Output 500 ml  Net 659.86 ml   Filed Weights   01/01/19 0504 01/02/19 0306 01/04/19 0500  Weight: 60.1 kg 60.9 kg 57.2 kg    Exam: General exam: In no acute distress. Respiratory system: Good air movement and clear to auscultation. Cardiovascular system: S1 & S2 heard, RRR. Marland Kitchen  Gastrointestinal system:  Abdomen is nondistended, soft and nontender.  Central nervous system: Moving all 4 extremities not following commands, patient is currently lethargic. Extremities: No pedal edema. Skin: No rashes, lesions or ulcers   Data Reviewed:    Labs: Basic Metabolic Panel: Recent Labs  Lab 12/30/18 0655 12/31/18 0433  01/02/19 2761 01/03/19 0446 01/03/19 1307 01/04/19 0551 01/04/19 1012 01/05/19 0632  NA 143 145   < > 152* 155* 155* 153*  --  152*  K 3.3* 2.8*   < > 3.4* 3.1* 3.4* 2.9*  --  4.6  CL 99 103   < > 117* 123* 122* 117*  --  116*  CO2 27 32   < > 22 23 22 29   --  24  GLUCOSE 93 85   < > 130* 115* 108* 101*  --  100*  BUN 8 8   < > 13 8 8 6   --  7  CREATININE 0.73 0.65   < > 0.61 0.75 0.78 0.62  --  1.75*  CALCIUM 8.4* 8.1*   < > 8.2* 8.6* 8.6* 8.3*  --  8.5*  MG 1.6* 1.9  --  2.2 2.0  --   --  1.9  --   PHOS 2.5  --   --   --   --   --   --   --   --    < > = values in this interval not displayed.   GFR Estimated Creatinine Clearance: 37.2 mL/min (A) (by C-G formula based on SCr of 1.75 mg/dL (H)). Liver Function Tests: Recent Labs  Lab 01/02/19 0611 01/03/19 0446  AST 18 17  ALT 22 19  ALKPHOS 135* 125  BILITOT 0.5 0.7  PROT 5.7* 5.7*  ALBUMIN 2.1* 2.0*   No results for input(s): LIPASE, AMYLASE in the last 168 hours. Recent Labs  Lab 12/31/18 0757 01/03/19 0446 01/05/19 0632  AMMONIA 49* 46* 25   Coagulation profile No results for input(s): INR, PROTIME in the last 168 hours.  CBC: Recent Labs  Lab 01/01/19 0033 01/02/19 0611 01/03/19 0446 01/04/19 0551 01/05/19 0632  WBC 4.2 4.2 5.2 4.3 4.8  NEUTROABS  --  3.1  --  2.8 3.2  HGB 12.1* 11.1* 10.7* 11.1* 10.7*  HCT 36.6* 36.0* 34.6* 35.1* 34.6*  MCV 90.8 92.1 91.8 92.1 92.3  PLT 362 329 382 359 391   Cardiac Enzymes: No results for input(s): CKTOTAL, CKMB, CKMBINDEX, TROPONINI in the last 168 hours. BNP (last 3 results) No results for input(s): PROBNP in the last 8760 hours. CBG: Recent  Labs  Lab 01/04/19 1715 01/04/19 1957 01/04/19 2315 01/05/19 0419 01/05/19 0755  GLUCAP 102* 90 104* 79 78   D-Dimer: No results for input(s): DDIMER in the last 72 hours. Hgb A1c: No results for input(s): HGBA1C in the last 72 hours. Lipid Profile: No results for input(s): CHOL, HDL, LDLCALC, TRIG, CHOLHDL, LDLDIRECT in the last 72 hours. Thyroid function studies: No results for input(s): TSH, T4TOTAL, T3FREE, THYROIDAB in the last 72 hours.  Invalid input(s): FREET3 Anemia work up: No results for input(s): VITAMINB12, FOLATE, FERRITIN, TIBC, IRON, RETICCTPCT in the last 72 hours. Sepsis Labs: Recent Labs  Lab 01/02/19 [id number] 01/03/19 0446 01/04/19 0551 01/05/19 0632  WBC 4.2 5.2 4.3 4.8   Microbiology Recent Results (from the past 240 hour(s))  Culture, Urine     Status: Abnormal   Collection Time: 12/31/18 11:26 PM  Result Value Ref Range Status   Specimen Description URINE, RANDOM  Final   Special Requests   Final    NONE Performed at [facility], [address] [place], [place] [address]    Culture STAPHYLOCOCCUS EPIDERMIDIS (A)  Final   Report Status 01/03/2019 FINAL  Final   Organism ID, Bacteria STAPHYLOCOCCUS EPIDERMIDIS  Final      Susceptibility   Staphylococcus epidermidis - MIC*    CIPROFLOXACIN <=0.5 SENSITIVE Sensitive     GENTAMICIN <=0.5 SENSITIVE Sensitive     NITROFURANTOIN 64 INTERMEDIATE Intermediate     OXACILLIN >=4 RESISTANT Resistant     TETRACYCLINE <=1 SENSITIVE Sensitive     VANCOMYCIN <=0.5 SENSITIVE Sensitive     TRIMETH/SULFA <=10 SENSITIVE Sensitive     CLINDAMYCIN >=8 RESISTANT Resistant     RIFAMPIN 16 RESISTANT Resistant     Inducible Clindamycin NEGATIVE Sensitive     * STAPHYLOCOCCUS EPIDERMIDIS  Culture, blood (routine x 2)     Status: None (Preliminary result)   Collection Time: 01/01/19 12:33 AM  Result Value Ref Range Status   Specimen Description BLOOD LEFT ANTECUBITAL  Final   Special Requests   Final     BOTTLES DRAWN AEROBIC AND ANAEROBIC Blood Culture results may not be optimal due to an inadequate volume of blood received in culture bottles   Culture   Final    NO GROWTH 4 DAYS Performed at [facility], [address] [place], [place] [address]    Report Status PENDING  Incomplete  Culture, blood (routine x 2)     Status: Abnormal   Collection Time: 01/01/19 12:33 AM  Result Value Ref Range Status   Specimen Description BLOOD LEFT HAND  Final   Special Requests   Final    BOTTLES DRAWN AEROBIC AND ANAEROBIC Blood Culture adequate volume   Culture  Setup Time   Final    GRAM POSITIVE COCCI AEROBIC BOTTLE ONLY CRITICAL RESULT CALLED TO, READ BACK BY AND VERIFIED WITH: PHARMD [facility] [id number] FCP    Culture (A)  Final    STAPHYLOCOCCUS SPECIES (COAGULASE NEGATIVE) THE SIGNIFICANCE OF ISOLATING THIS ORGANISM FROM A SINGLE SET OF BLOOD CULTURES WHEN MULTIPLE SETS ARE DRAWN IS UNCERTAIN. PLEASE NOTIFY THE MICROBIOLOGY DEPARTMENT WITHIN ONE WEEK IF SPECIATION AND SENSITIVITIES ARE REQUIRED. Performed at [facility], [address] [place], [place] [address]    Report Status 01/03/2019 FINAL  Final  Blood Culture ID Panel (Reflexed)     Status: Abnormal   Collection Time: 01/01/19 12:33 AM  Result Value Ref Range Status   Enterococcus species NOT DETECTED NOT DETECTED Final   Listeria monocytogenes NOT DETECTED NOT DETECTED Final   Staphylococcus species DETECTED (A) NOT DETECTED Final    Comment: Methicillin (oxacillin) resistant coagulase negative staphylococcus. Possible blood culture contaminant (unless isolated from more than one blood culture draw or clinical case suggests pathogenicity). No antibiotic treatment is indicated for blood  culture contaminants. CRITICAL RESULT CALLED TO, READ BACK BY AND VERIFIED WITH: PHARMD [facility] [phone] [id number] FCP    Staphylococcus aureus (BCID)  NOT DETECTED NOT DETECTED Final   Methicillin resistance DETECTED (A) NOT DETECTED Final     Comment: CRITICAL RESULT CALLED TO, READ BACK BY AND VERIFIED WITH: PHARMD [facility] [phone] [id number] FCP    Streptococcus species NOT DETECTED NOT DETECTED Final   Streptococcus agalactiae NOT DETECTED NOT DETECTED Final   Streptococcus pneumoniae NOT DETECTED NOT DETECTED Final   Streptococcus pyogenes NOT DETECTED NOT DETECTED Final   Acinetobacter baumannii NOT DETECTED NOT DETECTED Final   Enterobacteriaceae species NOT DETECTED NOT DETECTED Final   Enterobacter cloacae complex NOT DETECTED NOT DETECTED Final   Escherichia coli NOT DETECTED NOT DETECTED Final   Klebsiella oxytoca NOT DETECTED NOT DETECTED Final   Klebsiella pneumoniae NOT DETECTED NOT DETECTED Final   Proteus species NOT DETECTED NOT DETECTED Final   Serratia marcescens NOT DETECTED NOT DETECTED Final   Haemophilus influenzae NOT DETECTED NOT DETECTED Final   Neisseria meningitidis NOT DETECTED NOT DETECTED Final   Pseudomonas aeruginosa NOT DETECTED NOT DETECTED Final   Candida albicans NOT DETECTED NOT DETECTED Final   Candida glabrata NOT DETECTED NOT DETECTED Final   Candida krusei NOT DETECTED NOT DETECTED Final   Candida parapsilosis NOT DETECTED NOT DETECTED Final   Candida tropicalis NOT DETECTED NOT DETECTED Final  C difficile quick scan w PCR reflex     Status: None   Collection Time: 01/01/19 10:23 AM  Result Value Ref Range Status   C Diff antigen NEGATIVE NEGATIVE Final   C Diff toxin NEGATIVE NEGATIVE Final   C Diff interpretation No C. difficile detected.  Final    Comment: Performed at [facility], [address] [place], [place] [address]  Culture, blood (routine x 2)     Status: None (Preliminary result)   Collection Time: 01/03/19 12:38 PM  Result Value Ref Range Status   Specimen Description BLOOD RIGHT HAND  Final   Special Requests   Final    BOTTLES DRAWN AEROBIC AND ANAEROBIC Blood Culture results may not be optimal due to an inadequate volume of blood received in culture bottles    Culture   Final    NO GROWTH 2 DAYS Performed at [facility], [address], [place], [place] [address]    Report Status PENDING  Incomplete  Culture, blood (routine x 2)     Status: None (Preliminary result)   Collection Time: 01/03/19 12:38 PM  Result Value Ref Range Status   Specimen Description BLOOD RIGHT ARM  Final   Special Requests   Final    BOTTLES DRAWN AEROBIC ONLY Blood Culture results may not be optimal due to an inadequate volume of blood received in culture bottles   Culture   Final    NO GROWTH 2 DAYS Performed at [facility], [address], [place], [place] [address]    Report Status PENDING  Incomplete     Medications:   . chlorhexidine  15 mL Mouth Rinse BID  . cloNIDine  0.3 mg Transdermal Weekly  . enoxaparin (LOVENOX) injection  40 mg Subcutaneous Q24H  . folic acid  1 mg Intravenous Daily  . insulin aspart  0-9 Units Subcutaneous Q4H  . mouth rinse  15 mL Mouth Rinse BID  . mouth rinse  15 mL Mouth Rinse q12n4p  . pantoprazole (PROTONIX) IV  40 mg Intravenous Q24H  . sodium chloride flush  10-40 mL Intracatheter Q12H  . sodium chloride flush  3 mL Intravenous Q12H  . thiamine injection  100  mg Intravenous Daily   Continuous Infusions: . dextrose 5 % and 0.45 % NaCl with KCl 40 mEq/L 100 mL/hr at 01/04/19 2227      LOS: 20 days   Marinda Elk  [facility]  01/05/2019, 10:23 AM

## 2019-01-05 NOTE — Progress Notes (Signed)
Family wants to feed the patient despite speech recommendation. They understand risk and benefits of doing so, they know he can aspirate an make thing worst. Yet they still want to proceed with feeding.

## 2019-01-05 NOTE — Progress Notes (Signed)
Nutrition Follow-up  DOCUMENTATION CODES:   Not applicable  INTERVENTION:  If unable to pass MBS swallow evaluation,  Recommend initiation of enteral nutrition using Osmolite 1.5 formula at 25 ml/hr and increasing by 10 ml every 4 hours to goal rate of 55 ml/hr.  Recommend 30 ml Prostat BID per tube.   Tube feeding regimen to provide 2180 kcal (100% of needs), 113 grams of protein, and 1003 ml of water.   NUTRITION DIAGNOSIS:   Inadequate oral intake related to inability to eat as evidenced by NPO status; ongoing  GOAL:   Patient will meet greater than or equal to 90% of their needs; not met  MONITOR:   Diet advancement, Labs, Weight trends, Skin, I & O's  REASON FOR ASSESSMENT:   Consult Enteral/tube feeding initiation and management  ASSESSMENT:   58 yo male with PMH of [name], bipolar d/o, alcohol & marajuana abuse, [name], COPD, HTN who was admitted AMS, elevated ammonia level requiring intubation on admission. Extubated 2/5. Possible Wernicke's encephalopathy.   Pt unable to follow commands. Family reports mental status slightly improving. Pt NPO since 2/10. Noted, family requesting to feed patient regardless of NPO status and risk for aspiration, yet still wanting to feed pt. Per SLP, plans for MBS next date. Family is open to enteral nutrition if pt unable to pass MBS. Tube feeding recommendations stated above. If diet advances, RD to order nutritional supplements as appropriate.   Labs and medications reviewed. Sodium elevated at 151. Chloride elevated at 118. IV fluids infusing.    Diet Order:   Diet Order            DIET - DYS 1 Room service appropriate? Yes; Fluid consistency: Thin  Diet effective now              EDUCATION NEEDS:   Not appropriate for education at this time  Skin:  Skin Assessment: Reviewed RN Assessment  Last BM:  2/13  Height:   Ht Readings from Last 1 Encounters:  01/03/19 5' 6"  (1.676 m)    Weight:   Wt Readings from Last 1  Encounters:  01/04/19 57.2 kg    Ideal Body Weight:  64.5 kg  BMI:  Body mass index is 20.34 kg/m.  Estimated Nutritional Needs:   Kcal:  2000-2200  Protein:  105-120 grams  Fluid:  > 2.0 L    Corrin Parker, MS, RD, LDN Pager # [phone] After hours/ weekend pager # [phone]

## 2019-01-05 NOTE — Progress Notes (Signed)
Family requesting to feed pt, stating that they will feed him regardless of medical recommendations. RN informed family of risk of aspiration including silent aspiration from taking food PO. Family verbalized understanding and is continuing to request applesauce to feed pt. Due to NPO status, RN cannot give applesauce at this time. RN notified MD and SLP. SLP said they do not recommend anything by mouth until after MBS. Will continue to monitor

## 2019-01-05 NOTE — Progress Notes (Addendum)
NEUROLOGY PROGRESS NOTE  Subjective: Patient currently in bed.  Does not follow commands, is in fetal position does not attempt to interact.  Does withdraw briskly from pain and becomes agitated.  Exam: Vitals:   01/04/19 2001 01/05/19 0800  BP: 126/89 136/75  Pulse: (!) 106 75  Resp: (!) 22 18  Temp: 99.3 F (37.4 C) 98.1 F (36.7 C)  SpO2: 100% 100%    Physical Exam   HEENT-  Normocephalic, no lesions, without obvious abnormality.  Normal external eye and conjunctiva.  Extremities- Warm, dry and intact Musculoskeletal-no joint tenderness, deformity or swelling Skin-warm and dry, no hyperpigmentation, vitiligo, or suspicious lesions    Neuro:  Mental Status: Patient becomes alert with noxious stimuli.  Does not follow commands or interact.  Blinks to threat bilaterally.  Does become agitated with noxious stimuli and withdrawal.  When asked where he is he did state "I do not know ".  That is the only verbal output I could attain Cranial Nerves: II: Blinks to threat bilaterally III,IV, VI:  extra-ocular motions intact bilaterally pupils equal at 4 mm bilaterally, round, reactive to light and accommodation V,VII: smile symmetric,  VIII: hearing normal bilaterally I Motor: Moving all extremities antigravity Sensory: Responds to noxious stimuli.  Deep Tendon Reflexes: 2+ and symmetric throughout Plantars: Right: downgoing   Left: downgoing   Medications:  Prior to Admission:  Medications Prior to Admission  Medication Sig Dispense Refill Last Dose  . albuterol (PROAIR HFA) 108 (90 Base) MCG/ACT inhaler Inhale 2 puffs into the lungs every 4 (four) hours as needed for wheezing or shortness of breath. 1 Inhaler 5 unk at prn  . albuterol (PROVENTIL) (2.5 MG/3ML) 0.083% nebulizer solution Take 3 mLs (2.5 mg total) by nebulization every 4 (four) hours as needed for shortness of breath. 120 mL 3 unk at prn  . aspirin EC 81 MG tablet Take 81 mg by mouth every evening.     12/13/2018  . atorvastatin (LIPITOR) 20 MG tablet Take 1 tablet (20 mg total) by mouth daily. For high cholesterol 30 tablet 0 12/13/2018  . budesonide-formoterol (SYMBICORT) 160-4.5 MCG/ACT inhaler Inhale 2 puffs into the lungs 2 (two) times daily.   12/12/2018  . divalproex (DEPAKOTE ER) 500 MG 24 hr tablet Take 3 tablets (1,500 mg total) by mouth at bedtime. 90 tablet 2 12/13/2018  . FLUoxetine (PROZAC) 40 MG capsule Take 1 capsule (40 mg total) by mouth daily. For mood control 30 capsule 2 12/13/2018  . furosemide (LASIX) 20 MG tablet Take 1 tablet (20 mg total) by mouth daily. 30 tablet 1 12/13/2018  . gabapentin (NEURONTIN) 400 MG capsule Take 1 capsule (400 mg total) by mouth 3 (three) times daily. 90 capsule 2 12/13/2018  . ipratropium (ATROVENT) 0.02 % nebulizer solution Take 2.5 mLs (0.5 mg total) by nebulization 3 (three) times daily. 225 mL 11 12/01/2018  . metoprolol tartrate (LOPRESSOR) 50 MG tablet Take 1 tablet (50 mg total) by mouth 2 (two) times daily. 60 tablet 0 12/13/2018  . OLANZapine (ZYPREXA) 10 MG tablet Take 1.5 tablets (15 mg total) by mouth at bedtime. For mood stability 45 tablet 2 12/13/2018  . pantoprazole (PROTONIX) 20 MG tablet Take 1 tablet (20 mg total) by mouth daily. 30 tablet 1 12/14/2018  . potassium chloride SA (K-DUR,KLOR-CON) 20 MEQ tablet Take 1 tablet (20 mEq total) by mouth daily. 30 tablet 1 12/13/2018  . Tiotropium Bromide-Olodaterol (STIOLTO RESPIMAT) 2.5-2.5 MCG/ACT AERS Inhale 2 puffs into the lungs daily. 1 Inhaler 5  12/13/2018  . traZODone (DESYREL) 50 MG tablet Take 1 tablet (50 mg total) by mouth at bedtime as needed for sleep. 30 tablet 2 12/13/2018   Scheduled: . chlorhexidine  15 mL Mouth Rinse BID  . cloNIDine  0.3 mg Transdermal Weekly  . enoxaparin (LOVENOX) injection  40 mg Subcutaneous Q24H  . folic acid  1 mg Intravenous Daily  . insulin aspart  0-9 Units Subcutaneous Q4H  . mouth rinse  15 mL Mouth Rinse BID  . mouth rinse  15 mL Mouth Rinse  q12n4p  . pantoprazole (PROTONIX) IV  40 mg Intravenous Q24H  . sodium chloride flush  10-40 mL Intracatheter Q12H  . sodium chloride flush  3 mL Intravenous Q12H  . thiamine injection  100 mg Intravenous Daily   Continuous: . dextrose 5 % and 0.45 % NaCl with KCl 40 mEq/L 100 mL/hr at 01/04/19 2227    Pertinent Labs/Diagnostics: -Sodium 152 -Chloride 116 -Creatinine 1.75 -Calcium 8.5 - Ammonia has trended down to within normal limits of 25  EEG: Clinical Interpretation: This EEG is consistent with a generalized nonspecific cerebral dysfunction (encephalopathy). There was no seizure or seizure predisposition recorded on this study. Please note that lack of epileptiform activity on EEG does not preclude the possibility of epilepsy    Felicie Morn PA-C [facility] [phone]   Assessment: 58 year old male with past medical history of alcohol abuse, substance abuse who was admitted likely for withdrawal seizures and delirium tremens.  Patient was started empiric high-dose thiamine 500 milligrams 3 times daily for 3 days.  Most recent EEG was negative for epileptiform discharges.  CT head showed no evidence of acute intracranial abnormalities.  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   I have reviewed the contents of history and physical exam as documented by PA/ARNP/Resident and agree with above documentation.  I have discussed and formulated the above plan as documented. Edits to the note have been made as needed.  MRI brian still pending. Will continue to follow.     Georgiana Spinner Pheobe Sandiford MD [facility] [phone]   If 7pm to 7am, please call on call as listed on AMION.

## 2019-01-05 NOTE — Progress Notes (Addendum)
Physical Therapy Treatment Patient Details Name: Jeffrey [name] MRN: [id number] DOB: 04-12-1961 Today's Date: 01/05/2019    History of Present Illness Pt is a 58 year old man admitted 12/16/18 with respiratory distress due to PNA. Intubated 1/24-2/5. Pt with ETOH withdrawal seizure on 1/27. +cocaine upon admission. PMH: bipolar, COPD, alcohol abuse, HTN, [name], depression, CHF.    PT Comments    Pt performed limited treatment due to innability to follow commands.  He presents with strength capable of mobilizing but due to cognition it is difficult to mobilize with patient.  Continued to recommend SNF placement at d/c.      Follow Up Recommendations  SNF     Equipment Recommendations  Other (comment)(TBD)    Recommendations for Other Services       Precautions / Restrictions Precautions Precautions: Fall Restrictions Weight Bearing Restrictions: No    Mobility  Bed Mobility Overal bed mobility: Needs Assistance Bed Mobility: Supine to Sit     Supine to sit: Max assist;+2 for physical assistance Sit to supine: Supervision   General bed mobility comments: assist for LEs and to raise trunk into sitting, pt demonstrating initiation of return to supine  Transfers Overall transfer level: Needs assistance               General transfer comment: Attempted sit to stand in sara stedy patient unable to follow VCs for facilitation to achieve standing.    Ambulation/Gait                 Stairs             Wheelchair Mobility    Modified Rankin (Stroke Patients Only)       Balance Overall balance assessment: Needs assistance Sitting-balance support: Bilateral upper extremity supported;No upper extremity supported Sitting balance-Leahy Scale: Poor Sitting balance - Comments: x 2 min before pushing to return back to supine.  Able to sit unassisted.                                      Cognition Arousal/Alertness: Awake/alert Behavior  During Therapy: Flat affect;Restless Overall Cognitive Status: Difficult to assess                                        Exercises      General Comments        Pertinent Vitals/Pain Pain Assessment: Faces Faces Pain Scale: No hurt    Home Living                      Prior Function            PT Goals (current goals can now be found in the care plan section) Acute Rehab PT Goals Patient Stated Goal: none stated Potential to Achieve Goals: Fair Progress towards PT goals: Not progressing toward goals - comment    Frequency    Min 2X/week      PT Plan Current plan remains appropriate    Co-evaluation              AM-PAC PT "6 Clicks" Mobility   Outcome Measure  Help needed turning from your back to your side while in a flat bed without using bedrails?: Total Help needed moving from lying on your back to sitting on the side  of a flat bed without using bedrails?: Total Help needed moving to and from a bed to a chair (including a wheelchair)?: Total Help needed standing up from a chair using your arms (e.g., wheelchair or bedside chair)?: Total Help needed to walk in hospital room?: Total Help needed climbing 3-5 steps with a railing? : Total 6 Click Score: 6    End of Session Equipment Utilized During Treatment: Gait belt Activity Tolerance: Other (comment)(limited by cognition) Patient left: in bed;with call bell/phone within reach;with family/visitor present;with bed alarm set Nurse Communication: Mobility status PT Visit Diagnosis: Other abnormalities of gait and mobility (R26.89);Muscle weakness (generalized) (M62.81)     Time: 1191-4782 PT Time Calculation (min) (ACUTE ONLY): 15 min  Charges:  $Therapeutic Activity: 8-22 mins                     [name], PTA Acute Rehabilitation Services Pager [phone] Office [phone]     Jeffrey [name] Delay 01/05/2019, 6:19 PM

## 2019-01-06 ENCOUNTER — Inpatient Hospital Stay (HOSPITAL_COMMUNITY): Payer: Medicare Other

## 2019-01-06 DIAGNOSIS — Z7189 Other specified counseling: Secondary | ICD-10-CM

## 2019-01-06 DIAGNOSIS — Z515 Encounter for palliative care: Secondary | ICD-10-CM

## 2019-01-06 LAB — GLUCOSE, CAPILLARY
Glucose-Capillary: 73 mg/dL (ref 70–99)
Glucose-Capillary: 88 mg/dL (ref 70–99)
Glucose-Capillary: 85 mg/dL (ref 70–99)
Glucose-Capillary: 76 mg/dL (ref 70–99)
Glucose-Capillary: 78 mg/dL (ref 70–99)

## 2019-01-06 LAB — BASIC METABOLIC PANEL
Anion gap: 9 (ref 5–15)
BUN: 5 mg/dL — ABNORMAL LOW (ref 6–20)
CO2: 22 mmol/L (ref 22–32)
Calcium: 8.3 mg/dL — ABNORMAL LOW (ref 8.9–10.3)
Chloride: 118 mmol/L — ABNORMAL HIGH (ref 98–111)
Creatinine, Ser: 0.63 mg/dL (ref 0.61–1.24)
GFR calc Af Amer: >60 mL/min (ref >60)
GFR calc non Af Amer: >60 mL/min (ref >60)
Glucose, Bld: 83 mg/dL (ref 70–99)
Potassium: 3.7 mmol/L (ref 3.5–5.1)
Sodium: 149 mmol/L — ABNORMAL HIGH (ref 135–145)

## 2019-01-06 LAB — CULTURE, BLOOD (ROUTINE X 2): Culture: NO GROWTH

## 2019-01-06 LAB — PHOSPHORUS: Phosphorus: 3.7 mg/dL (ref 2.5–4.6)

## 2019-01-06 MED ORDER — JEVITY 1.2 CAL PO LIQD: 1000.0000 mL | ORAL | Status: DC

## 2019-01-06 MED ORDER — K PHOS MONO-SOD PHOS DI & MONO 155-852-130 MG PO TABS
500.0000 mg | ORAL_TABLET | Freq: Three times a day (TID) | ORAL | Status: DC
  Administered 2019-01-06: 500 mg via NASOGASTRIC
  Filled 2019-01-06: qty 2

## 2019-01-06 MED ORDER — PRO-STAT SUGAR FREE PO LIQD
30.0000 mL | Freq: Two times a day (BID) | ORAL | Status: DC
  Administered 2019-01-06 – 2019-01-07 (×3): 30 mL
  Filled 2019-01-06 (×5): qty 30

## 2019-01-06 MED ORDER — OSMOLITE 1.5 CAL PO LIQD
1000.0000 mL | ORAL | Status: DC
  Administered 2019-01-06: 1000 mL
  Filled 2019-01-06 (×4): qty 1000

## 2019-01-06 MED ORDER — POTASSIUM & SODIUM PHOSPHATES 280-160-250 MG PO PACK
2.0000 | PACK | Freq: Three times a day (TID) | ORAL | Status: DC
  Administered 2019-01-06 – 2019-01-07 (×4): 2 via ORAL
  Filled 2019-01-06 (×6): qty 2

## 2019-01-06 NOTE — Progress Notes (Signed)
[facility] PROGRESS NOTE    Progress Note  Jeffrey [name]  FSE:[id number] DOB: [birth year] DOA: 12/16/2018 PCP: Gareth Morgan, MD     Brief Narrative:   Jeffrey [name] is an 58 y.o. 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 MRI brain on 12/22/2018 and was unrevealing. He was treated initially with high-dose thiamine, with minimal improvement. Encephalopathy is thought to be related to alcohol. Continue thiamine and folate. Depakote level is therapeutic, his resume his Depakote dose. Neurology was consulted we were unable to obtain an MRI. Family is unrealistic about expectations they want full aggressive therapy and a PEG tube. Physical therapy was consulted recommended skilled nursing facility.  Alcohol use: Continue Ativan protocol. No signs of withdrawal.  Diarrhea: C. difficile negative.  COPD, severe (HCC) Well compensated.  Bipolar disorder with severe depression (HCC) Continue Depacon.  Chronic diastolic CHF (congestive heart failure) (HCC) Appears to be euvolemic.  Acute respiratory failure with hypoxemia (HCC) Due to pneumonia status post extubation.  Hypernatremia: There is a free water deficit, continues to improve with IV fluids. We will change to D5W.  Hypokalemia: Repleted IV now improved.  New acute kidney injury: Prerenal azotemia resolved with IV fluid hydration. Creatinine back to baseline  Nutrition: We will insert a core track catheter to start feeding as per family's request. So nutrition and  start enteral feedings. Daughter is unrealistic about expectation she was PEG tube placed.  DVT prophylaxis: lovenox Family Communication:none Disposition Plan/Barrier to D/C: unable to determine Code Status:     Code Status Orders  (From admission, onward)         Start     Ordered   12/16/18 1213  Full code  Continuous     12/16/18 1216        Code Status History    Date Active Date Inactive Code Status Order ID Comments User Context   12/16/2018 0429 12/16/2018 1216 Full Code [id number]  Briscoe Deutscher, MD ED   09/16/2018 1536 09/17/2018 1342 Full Code [id number]  Maurilio Lovely D, DO ED   06/16/2018 0750 06/17/2018 1356 DNR [id number]  Meredeth Ide, MD Inpatient   01/31/2018 2049 02/08/2018 2202 Full Code [id number]  Kerry Hough, PA-C Inpatient   01/31/2018 0533 01/31/2018 1850 Full Code [id number]  Zadie Rhine, MD ED   04/07/2017 2311 04/15/2017 1357 Full Code [id number]  Alberteen Sam, MD Inpatient   02/07/2017 2313 02/10/2017 1645 Full Code [id number]  Haydee Monica, MD Inpatient        IV Access:    Peripheral IV   Procedures and diagnostic studies:   No results found.   Medical Consultants:    None.  Anti-Infectives:   None  Subjective:    Jeffrey [name] lethargic nonverbal.  Objective:    Vitals:   01/05/19 2112 01/05/19 2334 01/06/19 0500 01/06/19 0642  BP: 134/78 132/69  126/69  Pulse: 93 96  89  Resp: 20 18  18   Temp: 97.8 F (36.6 C) 98.4 F (36.9 C)  99.2 F (37.3 C)  TempSrc: Oral Oral  Oral  SpO2: 98% 97%  91%  Weight:  59 kg   Height:        Intake/Output Summary (Last 24 hours) at 01/06/2019 1016 Last data filed at 01/06/2019 0300 Gross per 24 hour  Intake 1142.6 ml  Output 700 ml  Net 442.6 ml   Filed Weights   01/02/19 0306 01/04/19 0500 01/06/19 0500  Weight: 60.9 kg 57.2 kg 59 kg    Exam: General exam: In no acute distress. Respiratory system: Good air movement and clear to  auscultation. Cardiovascular system: S1 & S2 heard, RRR. Marland Kitchen.  Gastrointestinal system: Abdomen is nondistended, soft and nontender.  Central nervous system: Moving all 4 extremities not following commands, patient is currently lethargic. Extremities: No pedal edema. Skin: No rashes, lesions or ulcers   Data Reviewed:    Labs: Basic Metabolic Panel: Recent Labs  Lab 12/31/18 0433  01/02/19 0611 01/03/19 0446 01/03/19 1307 01/04/19 0551 01/04/19 1012 01/05/19 0632 01/05/19 1422 01/06/19 0601  NA 145   < > 152* 155* 155* 153*  --  152* 151* 149*  K 2.8*   < > 3.4* 3.1* 3.4* 2.9*  --  4.6 4.2 3.7  CL 103   < > 117* 123* 122* 117*  --  116* 118* 118*  CO2 32   < > 22 23 22 29   --  24 29 22   GLUCOSE 85   < > 130* 115* 108* 101*  --  100* 110* 83  BUN 8   < > 13 8 8 6   --  7 6 5*  CREATININE 0.65   < > 0.61 0.75 0.78 0.62  --  1.75* 0.77 0.63  CALCIUM 8.1*   < > 8.2* 8.6* 8.6* 8.3*  --  8.5* 8.5* 8.3*  MG 1.9  --  2.2 2.0  --   --  1.9  --   --   --    < > = values in this interval not displayed.   GFR Estimated Creatinine Clearance: 84 mL/min (by C-G formula based on SCr of 0.63 mg/dL). Liver Function Tests: Recent Labs  Lab 01/02/19 0611 01/03/19 0446  AST 18 17  ALT 22 19  ALKPHOS 135* 125  BILITOT 0.5 0.7  PROT 5.7* 5.7*  ALBUMIN 2.1* 2.0*   No results for input(s): LIPASE, AMYLASE in the last 168 hours. Recent Labs  Lab 12/31/18 0757 01/03/19 0446 01/05/19 0632  AMMONIA 49* 46* 25   Coagulation profile No results for input(s): INR, PROTIME in the last 168 hours.  CBC: Recent Labs  Lab 01/01/19 0033 01/02/19 0611 01/03/19 0446 01/04/19 0551 01/05/19 0632  WBC 4.2 4.2 5.2 4.3 4.8  NEUTROABS  --  3.1  --  2.8 3.2  HGB 12.1* 11.1* 10.7* 11.1* 10.7*  HCT 36.6* 36.0* 34.6* 35.1* 34.6*  MCV 90.8 92.1 91.8 92.1 92.3  PLT 362 329 382 359 391   Cardiac Enzymes: No results for input(s): CKTOTAL, CKMB, CKMBINDEX, TROPONINI in the last 168 hours. BNP (last  3 results) No results for input(s): PROBNP in the last 8760 hours. CBG: Recent Labs  Lab 01/05/19 1645 01/05/19 2107 01/05/19 2354 01/06/19 0341 01/06/19 0845  GLUCAP 73 77 70 73 88   D-Dimer: No results for input(s): DDIMER in the last 72 hours. Hgb A1c: No results for input(s): HGBA1C in the last 72 hours. Lipid Profile: No results for input(s): CHOL, HDL, LDLCALC, TRIG, CHOLHDL, LDLDIRECT in the last 72 hours. Thyroid function studies: No results for input(s): TSH, T4TOTAL, T3FREE, THYROIDAB in the last 72 hours.  Invalid  input(s): FREET3 Anemia work up: No results for input(s): VITAMINB12, FOLATE, FERRITIN, TIBC, IRON, RETICCTPCT in the last 72 hours. Sepsis Labs: Recent Labs  Lab 01/02/19 7096 01/03/19 0446 01/04/19 0551 01/05/19 0632  WBC 4.2 5.2 4.3 4.8   Microbiology Recent Results (from the past 240 hour(s))  Culture, Urine     Status: Abnormal   Collection Time: 12/31/18 11:26 PM  Result Value Ref Range Status   Specimen Description URINE, RANDOM  Final   Special Requests   Final    NONE Performed at [facility], [address] [place], [place] [address]    Culture STAPHYLOCOCCUS EPIDERMIDIS (A)  Final   Report Status 01/03/2019 FINAL  Final   Organism ID, Bacteria STAPHYLOCOCCUS EPIDERMIDIS  Final      Susceptibility   Staphylococcus epidermidis - MIC*    CIPROFLOXACIN <=0.5 SENSITIVE Sensitive     GENTAMICIN <=0.5 SENSITIVE Sensitive     NITROFURANTOIN 64 INTERMEDIATE Intermediate     OXACILLIN >=4 RESISTANT Resistant     TETRACYCLINE <=1 SENSITIVE Sensitive     VANCOMYCIN <=0.5 SENSITIVE Sensitive     TRIMETH/SULFA <=10 SENSITIVE Sensitive     CLINDAMYCIN >=8 RESISTANT Resistant     RIFAMPIN 16 RESISTANT Resistant     Inducible Clindamycin NEGATIVE Sensitive     * STAPHYLOCOCCUS EPIDERMIDIS  Culture, blood (routine x 2)     Status: None   Collection Time: 01/01/19 12:33 AM  Result Value Ref Range Status   Specimen Description BLOOD  LEFT ANTECUBITAL  Final   Special Requests   Final    BOTTLES DRAWN AEROBIC AND ANAEROBIC Blood Culture results may not be optimal due to an inadequate volume of blood received in culture bottles   Culture   Final    NO GROWTH 5 DAYS Performed at [facility], [address], [place], [place] [address]    Report Status 01/06/2019 FINAL  Final  Culture, blood (routine x 2)     Status: Abnormal   Collection Time: 01/01/19 12:33 AM  Result Value Ref Range Status   Specimen Description BLOOD LEFT HAND  Final   Special Requests   Final    BOTTLES DRAWN AEROBIC AND ANAEROBIC Blood Culture adequate volume   Culture  Setup Time   Final    GRAM POSITIVE COCCI AEROBIC BOTTLE ONLY CRITICAL RESULT CALLED TO, READ BACK BY AND VERIFIED WITH: PHARMD [facility] [phone] [id number] FCP    Culture (A)  Final    STAPHYLOCOCCUS SPECIES (COAGULASE NEGATIVE) THE SIGNIFICANCE OF ISOLATING THIS ORGANISM FROM A SINGLE SET OF BLOOD CULTURES WHEN MULTIPLE SETS ARE DRAWN IS UNCERTAIN. PLEASE NOTIFY THE MICROBIOLOGY DEPARTMENT WITHIN ONE WEEK IF SPECIATION AND SENSITIVITIES ARE REQUIRED. Performed at [facility], [address] [place], [place] [address]    Report Status 01/03/2019 FINAL  Final  Blood Culture ID Panel (Reflexed)     Status: Abnormal   Collection Time: 01/01/19 12:33 AM  Result Value Ref Range Status   Enterococcus species NOT DETECTED NOT DETECTED Final   Listeria monocytogenes NOT DETECTED NOT DETECTED Final   Staphylococcus species DETECTED (A) NOT DETECTED Final    Comment: Methicillin (oxacillin) resistant coagulase negative staphylococcus. Possible blood culture contaminant (unless isolated from more than one blood culture draw or clinical case suggests pathogenicity). No antibiotic treatment is indicated for blood  culture contaminants. CRITICAL RESULT CALLED TO, READ BACK BY AND VERIFIED WITH: PHARMD [facility] [phone] FCP    Staphylococcus aureus (BCID) NOT DETECTED  NOT DETECTED Final    Methicillin resistance DETECTED (A) NOT DETECTED Final    Comment: CRITICAL RESULT CALLED TO, READ BACK BY AND VERIFIED WITH: PHARMD [facility] [phone] [id number] FCP    Streptococcus species NOT DETECTED NOT DETECTED Final   Streptococcus agalactiae NOT DETECTED NOT DETECTED Final   Streptococcus pneumoniae NOT DETECTED NOT DETECTED Final   Streptococcus pyogenes NOT DETECTED NOT DETECTED Final   Acinetobacter baumannii NOT DETECTED NOT DETECTED Final   Enterobacteriaceae species NOT DETECTED NOT DETECTED Final   Enterobacter cloacae complex NOT DETECTED NOT DETECTED Final   Escherichia coli NOT DETECTED NOT DETECTED Final   Klebsiella oxytoca NOT DETECTED NOT DETECTED Final   Klebsiella pneumoniae NOT DETECTED NOT DETECTED Final   Proteus species NOT DETECTED NOT DETECTED Final   Serratia marcescens NOT DETECTED NOT DETECTED Final   Haemophilus influenzae NOT DETECTED NOT DETECTED Final   Neisseria meningitidis NOT DETECTED NOT DETECTED Final   Pseudomonas aeruginosa NOT DETECTED NOT DETECTED Final   Candida albicans NOT DETECTED NOT DETECTED Final   Candida glabrata NOT DETECTED NOT DETECTED Final   Candida krusei NOT DETECTED NOT DETECTED Final   Candida parapsilosis NOT DETECTED NOT DETECTED Final   Candida tropicalis NOT DETECTED NOT DETECTED Final  C difficile quick scan w PCR reflex     Status: None   Collection Time: 01/01/19 10:23 AM  Result Value Ref Range Status   C Diff antigen NEGATIVE NEGATIVE Final   C Diff toxin NEGATIVE NEGATIVE Final   C Diff interpretation No C. difficile detected.  Final    Comment: Performed at [facility], [address], [place], [place] [address]  Culture, blood (routine x 2)     Status: None (Preliminary result)   Collection Time: 01/03/19 12:38 PM  Result Value Ref Range Status   Specimen Description BLOOD RIGHT HAND  Final   Special Requests   Final    BOTTLES DRAWN AEROBIC AND ANAEROBIC Blood Culture results may not be optimal due to an  inadequate volume of blood received in culture bottles   Culture   Final    NO GROWTH 3 DAYS Performed at [facility], [address], [place], [place] [address]    Report Status PENDING  Incomplete  Culture, blood (routine x 2)     Status: None (Preliminary result)   Collection Time: 01/03/19 12:38 PM  Result Value Ref Range Status   Specimen Description BLOOD RIGHT ARM  Final   Special Requests   Final    BOTTLES DRAWN AEROBIC ONLY Blood Culture results may not be optimal due to an inadequate volume of blood received in culture bottles   Culture   Final    NO GROWTH 3 DAYS Performed at [facility], [address] [place], [place] [address]    Report Status PENDING  Incomplete     Medications:   . chlorhexidine  15 mL Mouth Rinse BID  . cloNIDine  0.3 mg Transdermal Weekly  . enoxaparin (LOVENOX) injection  40 mg Subcutaneous Q24H  . folic acid  1 mg Intravenous Daily  . insulin aspart  0-9 Units Subcutaneous Q4H  . mouth rinse  15 mL Mouth Rinse BID  . mouth rinse  15 mL Mouth Rinse q12n4p  . pantoprazole (PROTONIX) IV  40 mg Intravenous Q24H  . sodium chloride flush  10-40 mL Intracatheter Q12H  . sodium chloride flush  3 mL Intravenous Q12H  . thiamine injection  100 mg Intravenous  Daily   Continuous Infusions: . dextrose 5 % and 0.45% NaCl 100 mL/hr at 01/06/19 0501  . valproate sodium 500 mg (01/06/19 0557)      LOS: 21 days   Marinda Elk  [facility]  01/06/2019, 10:16 AM

## 2019-01-06 NOTE — Progress Notes (Signed)
Portable equipments has still not delivered pt feeding pump. Reported off to oncoming RN Francisca. Dionne Bucy RN

## 2019-01-06 NOTE — Progress Notes (Signed)
Pt CBG was 76 and MD notified as ordered. MD ordered for pt feeding to start; portable notified for feeding pump to start feedings but theres' no feeding pump available now per portable and will bring one once it become available. Pt ordered dextrose fluids infusing. Pt resting in bed and will continue to closely monitor. Arabella Merles Heinz Eckert RN.

## 2019-01-06 NOTE — Progress Notes (Signed)
Nutrition Follow-up  DOCUMENTATION CODES:   Not applicable  INTERVENTION:  Initiate Osmolite 1.5 formula at 25 ml/hr and increasing by 10 ml every 6 hours to goal rate of 55 ml/hr.  Recommend 30 ml Prostat BID per tube.   Tube feeding regimen to provide 2180 kcal (100% of needs), 113 grams of protein, and 1003 ml of water.   Monitor magnesium, potassium, and phosphorus daily for at least 3 days, MD to replete as needed, as pt is at risk for refeeding syndrome given prolonged poor po, NPO/CL status over the past >/= 1 week.  NUTRITION DIAGNOSIS:   Inadequate oral intake related to inability to eat as evidenced by NPO status; ongoing  GOAL:   Patient will meet greater than or equal to 90% of their needs; not met  MONITOR:   PO intake, TF tolerance, Labs, Skin, Weight trends, I & O's  REASON FOR ASSESSMENT:   Consult Enteral/tube feeding initiation and management  ASSESSMENT:   58 yo male with PMH of [name], bipolar d/o, alcohol & marajuana abuse, [name], COPD, HTN who was admitted AMS, elevated ammonia level requiring intubation on admission. Extubated 2/5. Possible Wernicke's encephalopathy.   Pt recommended by SLP NPO status due to decreased level of mentation. Noted, family requesting to feed patient regardless of NPO status and known risk for aspiration, yet still wanting to feed pt, thus dysphagia 1 diet currently ordered per MD. Per MD, family requests full aggressive therapy and care for patient. Plans for Cortrak NGT placement today to start enteral nutrition with possible plans for PEG placement. Monitor magnesium, potassium, and phosphorus daily for at least 3 days, MD to replete as needed, as pt is at risk for refeeding syndrome given prolonged poor po, NPO/CL status over the past >/= 1 week. Labs and medications reviewed.   Diet Order:   Diet Order            DIET - DYS 1 Room service appropriate? Yes; Fluid consistency: Thin  Diet effective now               EDUCATION NEEDS:   Not appropriate for education at this time  Skin:  Skin Assessment: Reviewed RN Assessment  Last BM:  2/13  Height:   Ht Readings from Last 1 Encounters:  01/03/19 5' 6"  (1.676 m)    Weight:   Wt Readings from Last 1 Encounters:  01/06/19 59 kg    Ideal Body Weight:  64.5 kg  BMI:  Body mass index is 20.98 kg/m.  Estimated Nutritional Needs:   Kcal:  2000-2200  Protein:  105-120 grams  Fluid:  > 2.0 L    Corrin Parker, MS, RD, LDN Pager # [phone] After hours/ weekend pager # [phone]

## 2019-01-06 NOTE — Consult Note (Signed)
Consultation Note Date: 01/06/2019   Patient Name: Jeffrey [name]  DOB: 02-04-1961  MRN: [id number]  Age / Sex: 58 y.o., male  PCP: Gareth Morgan, MD Referring Physician: Marinda Elk, MD  Reason for Consultation: Establishing goals of care and Psychosocial/spiritual support  HPI/Patient Profile: 58 y.o. male  with past medical history of COPD, diastolic heart failure admitted on 12/16/2018 to [facility] with respiratory failure secondary to pneumonia.  Patient required intubation and was transferred to [facility].  He was extubated, transferred out of ICU on 01/02/2019 is now on the medical floor.  He is slowly recovering but only eating bites and sips.  He remains encephalopathic  Consult ordered for "end-of-life", goals of care.   Clinical Assessment and Goals of Care: Patient seen, chart reviewed.  Patient's father and daughter are at the bedside.  Patient himself does look at me when I speak to him but he is nonverbal.  Patient's father states that he has spoken a few words to his daughter and eaten a few bites and sips of water.  He is not following commands  Introduced palliative medicine services as an additional resource and source of support.  Compared and contrasted with hospice services.  Patient is on disability secondary to mental health issues.  He is divorced with 2 children with whom he has a relationship.  His daughter, [name] at [phone] would be his healthcare proxy where he unable to speak for himself.  He also has a son, [name]  [name], [name] is his healthcare proxy at this point as he cannot speak for himself.  She can be reached at area code [phone]    SUMMARY OF RECOMMENDATIONS   Full code by choice If patient declined in the hospital, family would elect for reintubation as well as full scope of treatment Family  in favor of temporary feeding through core track and even more permanent means such as a PEG if needed Family is hopeful for patient to be transferred to [facility] for rehab Code Status/Advance Care Planning:  Full code    Symptom Management:   Chronic pain to back: Continue with morphine IV 2 mg every 4 hours as needed  Palliative Prophylaxis:   Aspiration, Bowel Regimen, Delirium Protocol, Eye Care, Frequent Pain Assessment, Oral Care and Turn Reposition  Additional Recommendations (Limitations, Scope, Preferences):  Full Scope Treatment  Psycho-social/Spiritual:   Desire for further Chaplaincy support:no  Additional Recommendations: Referral to Community Resources   Prognosis:   Unable to determine  Discharge Planning: To Be Determined      Primary Diagnoses: Present on Admission: . Toxic encephalopathy . Alcohol use disorder, severe, dependence (HCC) . Bipolar disorder with severe depression (HCC) . COPD, severe (HCC) . Chronic diastolic CHF (congestive heart failure) (HCC) . Acute hepatic encephalopathy . Respiratory failure (HCC)   I have reviewed the medical record, interviewed the patient and family, and examined the patient. The following aspects are pertinent.  Past Medical History:  Diagnosis Date  .  Acute blood loss anemia 02/07/2017  . Anxiety   . Arthritis    deg disease, bulging disk,  shoulder level  . Bipolar disorder (HCC)   . Bipolar disorder (HCC)   . 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 Depression    anxiety  . Hyperlipidemia   . Hypertension   . Peptic ulcer disease    Review  . Pneumonia   . [name] (peripheral vascular disease) (HCC) 06/18/2015  . Shortness of breath    Social History   Socioeconomic History  . Marital status: Divorced    Spouse name: Not on file  . Number of children: 2  . Years of education: Not on file  . Highest education level: Not on file  Occupational History  . Occupation: Disabled  Social  Needs  . Financial resource strain: Not on file  . Food insecurity:    Worry: Not on file    Inability: Not on file  . Transportation needs:    Medical: Not on file    Non-medical: Not on file  Tobacco Use  . Smoking status: Current Every Day Smoker    Packs/day: 1.00    Years: 40.00    Pack years: 40.00    Types: Cigarettes    Start date: 08/04/1971  . Smokeless tobacco: Never Used  . Tobacco comment: peak rate of 2.5ppd, 1/2ppd on 11/27/2016 -- 6 cigarettes / day 01/25/18  Substance and Sexual Activity  . Alcohol use: No    Alcohol/week: 0.0 standard drinks  . Drug use: Yes    Types: Marijuana    Comment: most days   . Sexual activity: Never  Lifestyle  . Physical activity:    Days per week: Not on file    Minutes per session: Not on file  . Stress: Not on file  Relationships  . Social connections:    Talks on phone: Not on file    Gets together: Not on file    Attends religious service: Not on file    Active member of club or organization: Not on file    Attends meetings of clubs or organizations: Not on file    Relationship status: Not on file  Other Topics Concern  . Not on file  Social History Narrative   Originally from [place]. Previously has lived in [facility] [place] 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 Questionable asbestos exposure. Does have significant exposure to fumes. No mold exposure. No bird exposure. No pets currently.    Family History  Problem Relation Age of Onset  . Breast cancer Mother        deceased  . Heart disease Father   . Depression Daughter   . Anxiety disorder Daughter   . Anxiety disorder Son   . Depression Son   . Asthma Brother   . Heart attack Maternal Aunt   . Heart attack Maternal Uncle   . Heart attack Paternal Aunt   . Heart attack Paternal Uncle   . Heart attack Maternal Grandmother   . Heart attack Maternal Grandfather   . Emphysema Maternal Grandfather   . Heart  attack Paternal Grandmother   . Heart attack Paternal Grandfather   . Colon cancer Neg Hx   . Liver disease Neg Hx    Scheduled Meds: . chlorhexidine  15 mL Mouth Rinse BID  . cloNIDine  0.3 mg Transdermal Weekly  . enoxaparin (LOVENOX) injection  40 mg Subcutaneous Q24H  .  feeding supplement (PRO-STAT SUGAR FREE 64)  30 mL Per Tube BID  . folic acid  1 mg Intravenous Daily  . insulin aspart  0-9 Units Subcutaneous Q4H  . mouth rinse  15 mL Mouth Rinse BID  . mouth rinse  15 mL Mouth Rinse q12n4p  . pantoprazole (PROTONIX) IV  40 mg Intravenous Q24H  . sodium chloride flush  10-40 mL Intracatheter Q12H  . sodium chloride flush  3 mL Intravenous Q12H  . thiamine injection  100 mg Intravenous Daily   Continuous Infusions: . dextrose 5 % and 0.45% NaCl 100 mL/hr at 01/06/19 0501  . feeding supplement (OSMOLITE 1.5 CAL)    . valproate sodium 500 mg (01/06/19 0557)   PRN Meds:.[DISCONTINUED] acetaminophen **OR** acetaminophen, albuterol, bisacodyl, metoprolol tartrate, morphine injection, [DISCONTINUED] ondansetron **OR** ondansetron (ZOFRAN) IV, sodium chloride flush Medications Prior to Admission:  Prior to Admission medications   Medication Sig Start Date End Date Taking? Authorizing Provider  albuterol (PROAIR HFA) 108 (90 Base) MCG/ACT inhaler Inhale 2 puffs into the lungs every 4 (four) hours as needed for wheezing or shortness of breath. 07/01/18  Yes Leslye Peer, MD  albuterol (PROVENTIL) (2.5 MG/3ML) 0.083% nebulizer solution Take 3 mLs (2.5 mg total) by nebulization every 4 (four) hours as needed for shortness of breath. 02/02/17  Yes Roslynn Amble, MD  aspirin EC 81 MG tablet Take 81 mg by mouth every evening.    Yes [provider]  atorvastatin (LIPITOR) 20 MG tablet Take 1 tablet (20 mg total) by mouth daily. For high cholesterol 02/09/18  Yes Money, Gerlene Burdock, FNP  budesonide-formoterol (SYMBICORT) 160-4.5 MCG/ACT inhaler Inhale 2 puffs into the lungs 2 (two)  times daily.   Yes [provider]  divalproex (DEPAKOTE ER) 500 MG 24 hr tablet Take 3 tablets (1,500 mg total) by mouth at bedtime. 11/28/18  Yes Myrlene Broker, MD  FLUoxetine (PROZAC) 40 MG capsule Take 1 capsule (40 mg total) by mouth daily. For mood control 11/28/18  Yes Myrlene Broker, MD  furosemide (LASIX) 20 MG tablet Take 1 tablet (20 mg total) by mouth daily. 06/18/18  Yes Vassie Loll, MD  gabapentin (NEURONTIN) 400 MG capsule Take 1 capsule (400 mg total) by mouth 3 (three) times daily. 11/28/18  Yes Myrlene Broker, MD  ipratropium (ATROVENT) 0.02 % nebulizer solution Take 2.5 mLs (0.5 mg total) by nebulization 3 (three) times daily. 01/25/18  Yes Parrett, Tammy S, NP  metoprolol tartrate (LOPRESSOR) 50 MG tablet Take 1 tablet (50 mg total) by mouth 2 (two) times daily. 02/08/18  Yes Money, Gerlene Burdock, FNP  OLANZapine (ZYPREXA) 10 MG tablet Take 1.5 tablets (15 mg total) by mouth at bedtime. For mood stability 11/28/18  Yes Myrlene Broker, MD  pantoprazole (PROTONIX) 20 MG tablet Take 1 tablet (20 mg total) by mouth daily. 06/17/18 06/17/19 Yes Vassie Loll, MD  potassium chloride SA (K-DUR,KLOR-CON) 20 MEQ tablet Take 1 tablet (20 mEq total) by mouth daily. 06/18/18  Yes Vassie Loll, MD  Tiotropium Bromide-Olodaterol (STIOLTO RESPIMAT) 2.5-2.5 MCG/ACT AERS Inhale 2 puffs into the lungs daily. 11/01/18  Yes Leslye Peer, MD  traZODone (DESYREL) 50 MG tablet Take 1 tablet (50 mg total) by mouth at bedtime as needed for sleep. 11/28/18  Yes Myrlene Broker, MD   Allergies  Allergen Reactions  . Augmentin [Amoxicillin-Pot Clavulanate] Rash and Other (See Comments)    Has patient had a PCN reaction causing immediate rash, facial/tongue/throat swelling, SOB or lightheadedness  with hypotension: No Has patient had a PCN reaction causing severe rash involving mucus membranes or skin necrosis: No Has patient had a PCN reaction that required hospitalization No Has patient had a PCN  reaction occurring within the last 10 years: Yes If all of the above answers are "NO", then may proceed with Cephalosporin use.  . Ace Inhibitors Hives   Review of Systems  Unable to perform ROS: Acuity of condition    Physical Exam Vitals signs and nursing note reviewed.  Constitutional:      Appearance: He is ill-appearing.  HENT:     Head: Normocephalic and atraumatic.  Neck:     Musculoskeletal: Normal range of motion.  Cardiovascular:     Rate and Rhythm: Normal rate.  Pulmonary:     Effort: Pulmonary effort is normal.  Genitourinary:    Comments: foley Skin:    General: Skin is warm.  Neurological:     Comments: Fluctuating level of consciousness.  He currently appears alert but is nonverbal and not following commands  Psychiatric:     Comments: Patient in mittens, and low bed secondary to fall concerns Fluctuating level of consciousness otherwise unable to test mental status     Vital Signs: BP 126/69 (BP Location: Left Arm)   Pulse 89   Temp 99.2 F (37.3 C) (Oral)   Resp 18   Ht 5\' 6"  (1.676 m)   Wt 59 kg   SpO2 91%   BMI 20.98 kg/m  Pain Scale: PAINAD POSS *See Group Information*: S-Acceptable,Sleep, easy to arouse Pain Score: Asleep   SpO2: SpO2: 91 % O2 Device:SpO2: 91 % O2 Flow Rate: .O2 Flow Rate (L/min): 2 L/min  IO: Intake/output summary:   Intake/Output Summary (Last 24 hours) at 01/06/2019 1212 Last data filed at 01/06/2019 0300 Gross per 24 hour  Intake 1142.6 ml  Output 700 ml  Net 442.6 ml    LBM: Last BM Date: 01/05/19 Baseline Weight: Weight: 85 kg Most recent weight: Weight: 59 kg     Palliative Assessment/Data:   Flowsheet Rows     Most Recent Value  Intake Tab  Referral Department  Hospitalist  Unit at Time of Referral  Med/Surg Unit  Palliative Care Primary Diagnosis  Sepsis/Infectious Disease  Date Notified  01/05/19  Reason for referral  Clarify Goals of Care, Psychosocial or Spiritual support  Date of Admission   12/16/18  Date first seen by Palliative Care  01/06/19  # of days Palliative referral response time  1 Day(s)  # of days IP prior to Palliative referral  20  Clinical Assessment  Palliative Performance Scale Score  30%  Pain Max last 24 hours  Not able to report  Pain Min Last 24 hours  Not able to report  Dyspnea Max Last 24 Hours  Not able to report  Dyspnea Min Last 24 hours  Not able to report  Nausea Max Last 24 Hours  Not able to report  Nausea Min Last 24 Hours  Not able to report  Anxiety Max Last 24 Hours  Not able to report  Anxiety Min Last 24 Hours  Not able to report  Other Max Last 24 Hours  Not able to report  Psychosocial & Spiritual Assessment  Palliative Care Outcomes  Patient/Family meeting held?  Yes  Who was at the meeting?  pt's father and his dtr  Palliative Care Outcomes  Clarified goals of care      Time In: 1200 Time Out: 1310 Time Total: 70  min Greater than 50%  of this time was spent counseling and coordinating care related to the above assessment and plan.  Signed by: Irean Hong, NP   Please contact Palliative Medicine Team phone at [phone] for questions and concerns.  For individual provider: See Loretha Stapler

## 2019-01-06 NOTE — Progress Notes (Signed)
CSW continuing to follow for possible SNF placement. Patient has no bed offers at this time, still not medically stable for transfer to SNF.   CSW to follow.  Blenda Nicely, [place] Clinical Social Worker [phone]

## 2019-01-06 NOTE — Progress Notes (Signed)
Speech Language Pathology Treatment: Dysphagia  Patient Details Name: Jeffrey [name] MRN: [id number] DOB: Nov 29, 1960 Today's Date: 01/06/2019 Time: 3875-6433 SLP Time Calculation (min) (ACUTE ONLY): 30 min  Assessment / Plan / Recommendation Clinical Impression  Pt was seen for dysphagia treatment prior to his being transported to radiology in order to assess his ability to participate in the study. Pt's daughter, Jeffrey [name], and her fiance were present and assisted with attempts to feed the pt. Pt was adequately alert but demonstrated difficulty maintaining alertness. Despite max cues from this SLP and the pt's family, the pt did not accept any boluses. It was decided that the modified barium swallow study will be deferred due to the pt's inability to participate in it. A Cortrak order has been placed and SLP is in agreement with this plan. Pt's daughter, Jeffrey [name], has been educated regarding the pt's risk of aspiration with p.o. intake at this time as well as the potential for silent aspiration even though he may be asymptomatic at bedside. She has verbalized understanding and is in agreement with Cortrak placement. SLP will follow up for completion of modified barium swallow study when pt is able to participate.    HPI HPI: Pt is a 58 y.o. male with PMH significant for alcohol and polysubstance abuse, COPD, bipolar disorder, and chronic diastolic CHF, presented to ED with poor responsiveness, altered mental status and combativness. He reports to have been out drinking with friends. Pt has had recurring likely alcohol withdrawal seizures during admission. EEG on 1/31 revealed abnormal EEG secondary to general background slowing; finding may be seen with a diffuse disturbance that is etiologically nonspecific, but may include a metabolic encephalopathy or medication effect, among other possibilities. No epileptiform activity was noted. Intubated 1/24- 2/5.  CXR 2/8 concerning for multilobar  bronchopneumonia.  Head CT 2/8 no acute findings      SLP Plan  MBS       Recommendations  Diet recommendations: NPO Medication Administration: Via alternative means                Oral Care Recommendations: Oral care QID Follow up Recommendations: Skilled Nursing facility SLP Visit Diagnosis: Dysphagia, oropharyngeal phase (R13.12) Plan: MBS       Jeffrey [name] I. Vear Clock, MS, CCC-SLP Acute Rehabilitation Services Office number [phone] Pager [phone]               Jeffrey [name] 01/06/2019, 2:31 PM

## 2019-01-06 NOTE — Progress Notes (Signed)
SLP Cancellation Note  Patient Details Name: Jeffrey [name] MRN: [id number] DOB: January 09, 1961   Cancelled treatment:       Reason Eval/Treat Not Completed: Fatigue/lethargy limiting ability to participate. Pt was approached for treatment to assess his level of cooperation with p.o. intake today in order for him to participate in a modified barium swallow study. Pt's daughter was present and she reported that the pt received pain medication at 6:00 and has been "knocked out" ever since. SLP will follow up.    [name] 01/06/2019, 8:08 AM

## 2019-01-06 NOTE — Procedures (Signed)
Cortrak  Person Inserting Tube:  Renie Ora, RD Tube Type:  Cortrak - 43 inches Tube Location:  Left nare Initial Placement:  Stomach Secured by: Bridle Technique Used to Measure Tube Placement:  Documented cm marking at nare/ corner of mouth Cortrak Secured At:  60 cm    Cortrak Tube Team Note:  Consult received to place a Cortrak feeding tube.   X-ray is required, abdominal x-ray has been ordered by the Cortrak team. Please confirm tube placement before using the Cortrak tube.   If the tube becomes dislodged please keep the tube and contact the Cortrak team at [email or web address] (password [email or web address]) for replacement.  If after hours and replacement cannot be delayed, place a NG tube and confirm placement with an abdominal x-ray.     Trenton Gammon, MS, RD, LDN, [facility] Inpatient Clinical Dietitian Pager # [phone] After hours/weekend pager # [phone]

## 2019-01-06 NOTE — Progress Notes (Signed)
Occupational Therapy Treatment Patient Details Name: Jeffrey [name] MRN: [id number] DOB: October 19, 1961 Today's Date: 01/06/2019    History of present illness Pt is a 58 year old man admitted 12/16/18 with respiratory distress due to PNA. Intubated 1/24-2/5. Pt with ETOH withdrawal seizure on 1/27. +cocaine upon admission. PMH: bipolar, COPD, alcohol abuse, HTN, [name], depression, CHF.   OT comments  Pt with lethargy and appearing resistant to any bed level movement. Not following commands. Eyes closed most of session. Will continue to follow.  Follow Up Recommendations  SNF;Supervision/Assistance - 24 hour    Equipment Recommendations       Recommendations for Other Services      Precautions / Restrictions Precautions Precautions: Fall Precaution Comments: cortrak, flexiseal       Mobility Bed Mobility Overal bed mobility: Needs Assistance Bed Mobility: Rolling Rolling: Total assist         General bed mobility comments: pt appearing to resist facilitation to roll  Transfers                      Balance                                           ADL either performed or assessed with clinical judgement   ADL                                         General ADL Comments: total assist to change gown     Vision       Perception     Praxis      Cognition Arousal/Alertness: Lethargic Behavior During Therapy: Flat affect;Restless Overall Cognitive Status: Difficult to assess Area of Impairment: Following commands;Safety/judgement                       Following Commands: (pt not following commands) Safety/Judgement: Decreased awareness of safety;Decreased awareness of deficits     General Comments: resisting tactile cues to roll, raise arms        Exercises     Shoulder Instructions       General Comments      Pertinent Vitals/ Pain       Pain Assessment: Faces Faces Pain Scale: No hurt  Home  Living                                          Prior Functioning/Environment              Frequency  Min 2X/week        Progress Toward Goals  OT Goals(current goals can now be found in the care plan section)  Progress towards OT goals: Not progressing toward goals - comment(lethargy)  Acute Rehab OT Goals Patient Stated Goal: none stated OT Goal Formulation: Patient unable to participate in goal setting Time For Goal Achievement: 01/13/19 Potential to Achieve Goals: Fair  Plan Discharge plan remains appropriate    Co-evaluation                 AM-PAC OT "6 Clicks" Daily Activity     Outcome Measure   Help from another person eating meals?: Total Help  from another person taking care of personal grooming?: Total Help from another person toileting, which includes using toliet, bedpan, or urinal?: Total Help from another person bathing (including washing, rinsing, drying)?: Total Help from another person to put on and taking off regular upper body clothing?: Total Help from another person to put on and taking off regular lower body clothing?: Total 6 Click Score: 6    End of Session    OT Visit Diagnosis: Other symptoms and signs involving cognitive function;Muscle weakness (generalized) (M62.81)   Activity Tolerance Patient limited by lethargy   Patient Left in bed;with call bell/phone within reach;with bed alarm set, mitts applied   Nurse Communication          Time: 2336-1224 OT Time Calculation (min): 21 min  Charges: OT General Charges $OT Visit: 1 Visit OT Treatments $Therapeutic Activity: 8-22 mins  Martie Round, OTR/L Acute Rehabilitation Services Pager: [phone] Office: [phone]   Evern Bio 01/06/2019, 3:29 PM

## 2019-01-07 ENCOUNTER — Inpatient Hospital Stay (HOSPITAL_COMMUNITY): Payer: Medicare Other

## 2019-01-07 DIAGNOSIS — E512 Wernicke's encephalopathy: Principal | ICD-10-CM

## 2019-01-07 LAB — GLUCOSE, CAPILLARY
Glucose-Capillary: 102 mg/dL — ABNORMAL HIGH (ref 70–99)
Glucose-Capillary: 76 mg/dL (ref 70–99)
Glucose-Capillary: 81 mg/dL (ref 70–99)
Glucose-Capillary: 81 mg/dL (ref 70–99)
Glucose-Capillary: 84 mg/dL (ref 70–99)
Glucose-Capillary: 87 mg/dL (ref 70–99)
Glucose-Capillary: 93 mg/dL (ref 70–99)

## 2019-01-07 LAB — BASIC METABOLIC PANEL
Anion gap: 6 (ref 5–15)
BUN: 5 mg/dL — ABNORMAL LOW (ref 6–20)
CO2: 23 mmol/L (ref 22–32)
Calcium: 8 mg/dL — ABNORMAL LOW (ref 8.9–10.3)
Chloride: 118 mmol/L — ABNORMAL HIGH (ref 98–111)
Creatinine, Ser: 0.77 mg/dL (ref 0.61–1.24)
GFR calc Af Amer: >60 mL/min (ref >60)
GFR calc non Af Amer: >60 mL/min (ref >60)
Glucose, Bld: 83 mg/dL (ref 70–99)
Potassium: 3.7 mmol/L (ref 3.5–5.1)
Sodium: 147 mmol/L — ABNORMAL HIGH (ref 135–145)

## 2019-01-07 LAB — PHOSPHORUS: Phosphorus: 3.7 mg/dL (ref 2.5–4.6)

## 2019-01-07 MED ORDER — FREE WATER
200.0000 mL | Freq: Three times a day (TID) | Status: DC
  Administered 2019-01-07 (×3): 200 mL

## 2019-01-07 NOTE — Progress Notes (Signed)
[facility] PROGRESS NOTE    Progress Note  Jeffrey [name]  AOZ:[id number] DOB: 02/07/1961 DOA: 12/16/2018 PCP: Gareth MorganKnowlton, Steve, MD     Brief Narrative:   Jeffrey [name] is an 58 y.o. 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 MRI brain on 12/22/2018 and was unrevealing. Continue thiamine and folate. Continue Depakote. Family is unrealistic about expectations they want full aggressive therapy and a PEG tube. Physical therapy was consulted recommended skilled nursing facility.  Alcohol use: Continue Ativan protocol. No signs of withdrawal.  Diarrhea: C. difficile negative.  COPD, severe (HCC) Well compensated.  Bipolar disorder with severe depression (HCC) Continue Depacon.  Chronic diastolic CHF (congestive heart failure) (HCC) Appears to be euvolemic.  Acute respiratory failure with hypoxemia (HCC) Due to pneumonia status post extubation.  Hypernatremia: There is a free water deficit, continues to improve with IV fluids. We will change to D5W.  Hypokalemia: Repleted IV now improved.  New acute kidney injury: Prerenal azotemia resolved with IV fluid hydration. Creatinine back to baseline  Nutrition: Core track in place nutrition has been started. We will start free water flushes per tube. We will consult IR to place a percutaneous G-tube.  DVT prophylaxis: lovenox Family Communication:none Disposition Plan/Barrier to D/C: unable to determine Code Status:     Code Status Orders  (From admission, onward)           Start     Ordered   12/16/18 1213  Full code  Continuous     12/16/18 1216        Code Status History    Date Active Date Inactive Code Status Order ID Comments User Context   12/16/2018 0429 12/16/2018 1216 Full Code [id number]  Briscoe Deutscherpyd, Timothy S, MD ED   09/16/2018 1536 09/17/2018 1342 Full Code [id number]  Maurilio LovelyShah, Pratik D, DO ED   06/16/2018 0750 06/17/2018 1356 DNR [id number]  Meredeth IdeLama, Gagan S, MD Inpatient   01/31/2018 2049 02/08/2018 2202 Full Code [id number]  Kerry HoughSimon, Spencer E, PA-C Inpatient   01/31/2018 0533 01/31/2018 1850 Full Code [id number]  Zadie RhineWickline, Donald, MD ED   04/07/2017 2311 04/15/2017 1357 Full Code [id number]  Alberteen Samanford, Christopher P, MD Inpatient   02/07/2017 2313 02/10/2017 1645 Full Code [id number]  Haydee Monicaavid, Rachal A, MD Inpatient        IV Access:    Peripheral IV   Procedures and diagnostic studies:   Dg Abd Portable 1v  Result Date: 01/06/2019 CLINICAL DATA:  Feeding tube placement. EXAM: PORTABLE ABDOMEN - 1 VIEW COMPARISON:  Radiographs 12/23/2018. FINDINGS: 1535 hours. Feeding tube projects below the GE junction, tip at the gastric fundus. Multiple telemetry leads overlie the lower chest and abdomen. The visualized bowel gas pattern is normal. IMPRESSION: Tip of the feeding tube in the proximal stomach. Electronically Signed   By: Carey BullocksWilliam  Veazey M.D.   On: 01/06/2019 16:14     Medical Consultants:    None.  Anti-Infectives:   None  Subjective:    Jeffrey [name] lethargic nonverbal.  Objective:    Vitals:   01/06/19 0642 01/06/19 1340 01/07/19 0139 01/07/19 0500  BP: 126/69 119/81 (!) 121/54   Pulse:  89 91 (!) 107   Resp: 18 20    Temp: 99.2 F (37.3 C) 98 F (36.7 C) 98.1 F (36.7 C)   TempSrc: Oral Oral Axillary   SpO2: 91%  99%   Weight:    61.2 kg  Height:        Intake/Output Summary (Last 24 hours) at 01/07/2019 0937 Last data filed at 01/07/2019 0306 Gross per 24 hour  Intake 2127.47 ml  Output -  Net 2127.47 ml   Filed Weights    01/04/19 0500 01/06/19 0500 01/07/19 0500  Weight: 57.2 kg 59 kg 61.2 kg    Exam: General exam: In no acute distress. Respiratory system: Good air movement and clear to auscultation. Cardiovascular system: S1 & S2 heard, RRR. Marland Kitchen  Gastrointestinal system: Abdomen is nondistended, soft and nontender.  Central nervous system: Moving all 4 extremities not following commands, patient is currently lethargic. Extremities: No pedal edema. Skin: No rashes, lesions or ulcers   Data Reviewed:    Labs: Basic Metabolic Panel: Recent Labs  Lab 01/02/19 0611 01/03/19 0446  01/04/19 0551 01/04/19 1012 01/05/19 0632 01/05/19 1422 01/06/19 0601 01/06/19 1934 01/07/19 0653  NA 152* 155*   < > 153*  --  152* 151* 149*  --  147*  K 3.4* 3.1*   < > 2.9*  --  4.6 4.2 3.7  --  3.7  CL 117* 123*   < > 117*  --  116* 118* 118*  --  118*  CO2 22 23   < > 29  --  24 29 22   --  23  GLUCOSE 130* 115*   < > 101*  --  100* 110* 83  --  83  BUN 13 8   < > 6  --  7 6 5*  --  5*  CREATININE 0.61 0.75   < > 0.62  --  1.75* 0.77 0.63  --  0.77  CALCIUM 8.2* 8.6*   < > 8.3*  --  8.5* 8.5* 8.3*  --  8.0*  MG 2.2 2.0  --   --  1.9  --   --   --   --   --   PHOS  --   --   --   --   --   --   --   --  3.7 3.7   < > = values in this interval not displayed.   GFR Estimated Creatinine Clearance: 87.1 mL/min (by C-G formula based on SCr of 0.77 mg/dL). Liver Function Tests: Recent Labs  Lab 01/02/19 0611 01/03/19 0446  AST 18 17  ALT 22 19  ALKPHOS 135* 125  BILITOT 0.5 0.7  PROT 5.7* 5.7*  ALBUMIN 2.1* 2.0*   No results for input(s): LIPASE, AMYLASE in the last 168 hours. Recent Labs  Lab 01/03/19 0446 01/05/19 0632  AMMONIA 46* 25   Coagulation profile No results for input(s): INR, PROTIME in the last 168 hours.  CBC: Recent Labs  Lab 01/01/19 0033 01/02/19 0611 01/03/19 0446 01/04/19 0551 01/05/19 0632  WBC 4.2 4.2 5.2 4.3 4.8  NEUTROABS  --  3.1  --  2.8 3.2  HGB 12.1* 11.1* 10.7*  11.1* 10.7*  HCT 36.6* 36.0* 34.6* 35.1* 34.6*  MCV 90.8 92.1 91.8 92.1 92.3  PLT 362 329 382 359 391   Cardiac Enzymes: No results for input(s): CKTOTAL, CKMB, CKMBINDEX, TROPONINI in the last 168 hours. BNP (last 3 results) No results for input(s): PROBNP in the last  8760 hours. CBG: Recent Labs  Lab 01/06/19 1632 01/06/19 2000 01/07/19 0050 01/07/19 0403 01/07/19 0751  GLUCAP 76 78 93 102* 87   D-Dimer: No results for input(s): DDIMER in the last 72 hours. Hgb A1c: No results for input(s): HGBA1C in the last 72 hours. Lipid Profile: No results for input(s): CHOL, HDL, LDLCALC, TRIG, CHOLHDL, LDLDIRECT in the last 72 hours. Thyroid function studies: No results for input(s): TSH, T4TOTAL, T3FREE, THYROIDAB in the last 72 hours.  Invalid input(s): FREET3 Anemia work up: No results for input(s): VITAMINB12, FOLATE, FERRITIN, TIBC, IRON, RETICCTPCT in the last 72 hours. Sepsis Labs: Recent Labs  Lab 01/02/19 5638 01/03/19 0446 01/04/19 0551 01/05/19 0632  WBC 4.2 5.2 4.3 4.8   Microbiology Recent Results (from the past 240 hour(s))  Culture, Urine     Status: Abnormal   Collection Time: 12/31/18 11:26 PM  Result Value Ref Range Status   Specimen Description URINE, RANDOM  Final   Special Requests   Final    NONE Performed at [facility], [address], [place], [place] [address]    Culture STAPHYLOCOCCUS EPIDERMIDIS (A)  Final   Report Status 01/03/2019 FINAL  Final   Organism ID, Bacteria STAPHYLOCOCCUS EPIDERMIDIS  Final      Susceptibility   Staphylococcus epidermidis - MIC*    CIPROFLOXACIN <=0.5 SENSITIVE Sensitive     GENTAMICIN <=0.5 SENSITIVE Sensitive     NITROFURANTOIN 64 INTERMEDIATE Intermediate     OXACILLIN >=4 RESISTANT Resistant     TETRACYCLINE <=1 SENSITIVE Sensitive     VANCOMYCIN <=0.5 SENSITIVE Sensitive     TRIMETH/SULFA <=10 SENSITIVE Sensitive     CLINDAMYCIN >=8 RESISTANT Resistant     RIFAMPIN 16 RESISTANT Resistant      Inducible Clindamycin NEGATIVE Sensitive     * STAPHYLOCOCCUS EPIDERMIDIS  Culture, blood (routine x 2)     Status: None   Collection Time: 01/01/19 12:33 AM  Result Value Ref Range Status   Specimen Description BLOOD LEFT ANTECUBITAL  Final   Special Requests   Final    BOTTLES DRAWN AEROBIC AND ANAEROBIC Blood Culture results may not be optimal due to an inadequate volume of blood received in culture bottles   Culture   Final    NO GROWTH 5 DAYS Performed at [facility], [address], [place], [place] [address]    Report Status 01/06/2019 FINAL  Final  Culture, blood (routine x 2)     Status: Abnormal   Collection Time: 01/01/19 12:33 AM  Result Value Ref Range Status   Specimen Description BLOOD LEFT HAND  Final   Special Requests   Final    BOTTLES DRAWN AEROBIC AND ANAEROBIC Blood Culture adequate volume   Culture  Setup Time   Final    GRAM POSITIVE COCCI AEROBIC BOTTLE ONLY CRITICAL RESULT CALLED TO, READ BACK BY AND VERIFIED WITH: PHARMD [facility] [id number] FCP    Culture (A)  Final    STAPHYLOCOCCUS SPECIES (COAGULASE NEGATIVE) THE SIGNIFICANCE OF ISOLATING THIS ORGANISM FROM A SINGLE SET OF BLOOD CULTURES WHEN MULTIPLE SETS ARE DRAWN IS UNCERTAIN. PLEASE NOTIFY THE MICROBIOLOGY DEPARTMENT WITHIN ONE WEEK IF SPECIATION AND SENSITIVITIES ARE REQUIRED. Performed at [facility], [address], [place], [place] [address]    Report Status 01/03/2019 FINAL  Final  Blood Culture ID Panel (Reflexed)     Status: Abnormal   Collection Time: 01/01/19 12:33 AM  Result Value Ref Range Status   Enterococcus species  NOT DETECTED NOT DETECTED Final   Listeria monocytogenes NOT DETECTED NOT DETECTED Final   Staphylococcus species DETECTED (A) NOT DETECTED Final    Comment: Methicillin (oxacillin) resistant coagulase negative staphylococcus. Possible blood culture contaminant (unless isolated from more than one blood culture draw or clinical case suggests pathogenicity). No  antibiotic treatment is indicated for blood  culture contaminants. CRITICAL RESULT CALLED TO, READ BACK BY AND VERIFIED WITH: PHARMD [facility] [id number] FCP    Staphylococcus aureus (BCID) NOT DETECTED NOT DETECTED Final   Methicillin resistance DETECTED (A) NOT DETECTED Final    Comment: CRITICAL RESULT CALLED TO, READ BACK BY AND VERIFIED WITH: PHARMD [facility] [id number] FCP    Streptococcus species NOT DETECTED NOT DETECTED Final   Streptococcus agalactiae NOT DETECTED NOT DETECTED Final   Streptococcus pneumoniae NOT DETECTED NOT DETECTED Final   Streptococcus pyogenes NOT DETECTED NOT DETECTED Final   Acinetobacter baumannii NOT DETECTED NOT DETECTED Final   Enterobacteriaceae species NOT DETECTED NOT DETECTED Final   Enterobacter cloacae complex NOT DETECTED NOT DETECTED Final   Escherichia coli NOT DETECTED NOT DETECTED Final   Klebsiella oxytoca NOT DETECTED NOT DETECTED Final   Klebsiella pneumoniae NOT DETECTED NOT DETECTED Final   Proteus species NOT DETECTED NOT DETECTED Final   Serratia marcescens NOT DETECTED NOT DETECTED Final   Haemophilus influenzae NOT DETECTED NOT DETECTED Final   Neisseria meningitidis NOT DETECTED NOT DETECTED Final   Pseudomonas aeruginosa NOT DETECTED NOT DETECTED Final   Candida albicans NOT DETECTED NOT DETECTED Final   Candida glabrata NOT DETECTED NOT DETECTED Final   Candida krusei NOT DETECTED NOT DETECTED Final   Candida parapsilosis NOT DETECTED NOT DETECTED Final   Candida tropicalis NOT DETECTED NOT DETECTED Final  C difficile quick scan w PCR reflex     Status: None   Collection Time: 01/01/19 10:23 AM  Result Value Ref Range Status   C Diff antigen NEGATIVE NEGATIVE Final   C Diff toxin NEGATIVE NEGATIVE Final   C Diff interpretation No C. difficile detected.  Final    Comment: Performed at [facility], [address], [place], [place] [address]  Culture, blood (routine x 2)     Status: None (Preliminary result)   Collection  Time: 01/03/19 12:38 PM  Result Value Ref Range Status   Specimen Description BLOOD RIGHT HAND  Final   Special Requests   Final    BOTTLES DRAWN AEROBIC AND ANAEROBIC Blood Culture results may not be optimal due to an inadequate volume of blood received in culture bottles   Culture   Final    NO GROWTH 4 DAYS Performed at [facility], [address] [place], [place] [address]    Report Status PENDING  Incomplete  Culture, blood (routine x 2)     Status: None (Preliminary result)   Collection Time: 01/03/19 12:38 PM  Result Value Ref Range Status   Specimen Description BLOOD RIGHT ARM  Final   Special Requests   Final    BOTTLES DRAWN AEROBIC ONLY Blood Culture results may not be optimal due to an inadequate volume of blood received in culture bottles   Culture   Final    NO GROWTH 4 DAYS Performed at [facility], [address] [place], [place] [address]    Report Status PENDING  Incomplete     Medications:   . chlorhexidine  15 mL Mouth Rinse BID  . cloNIDine  0.3 mg Transdermal Weekly  .  enoxaparin (LOVENOX) injection  40 mg Subcutaneous Q24H  . feeding supplement (PRO-STAT SUGAR FREE 64)  30 mL Per Tube BID  . folic acid  1 mg Intravenous Daily  . insulin aspart  0-9 Units Subcutaneous Q4H  . mouth rinse  15 mL Mouth Rinse BID  . mouth rinse  15 mL Mouth Rinse q12n4p  . pantoprazole (PROTONIX) IV  40 mg Intravenous Q24H  . potassium & sodium phosphates  2 packet Oral TID  . sodium chloride flush  10-40 mL Intracatheter Q12H  . sodium chloride flush  3 mL Intravenous Q12H  . thiamine injection  100 mg Intravenous Daily   Continuous Infusions: . dextrose 5 % and 0.45% NaCl 100 mL/hr at 01/07/19 0413  . feeding supplement (OSMOLITE 1.5 CAL) 35 mL/hr at 01/07/19 0330  . valproate sodium 500 mg (01/07/19 0626)      LOS: 22 days   Marinda Elk  [facility]  01/07/2019, 9:37 AM

## 2019-01-07 NOTE — Progress Notes (Signed)
  Speech Language Pathology Treatment: Dysphagia  Patient Details Name: Jeffrey [name] MRN: [id number] DOB: 1961/06/07 Today's Date: 01/07/2019 Time: 1505-6979 SLP Time Calculation (min) (ACUTE ONLY): 7 min  Assessment / Plan / Recommendation Clinical Impression  Pt asleep on SLP arrival, but was easily roused.  Pt would not accept PO trials.  With ice chip presentation via spoon, SLP jerked his head away.  With thin liquid by spoon pt blew out of mouth when liquid contacted lips.  Pt is not able to participate in MBS at this time; however, family was not present for this assessment and may be able to encourage pt to participate in PO trials. SLP will tentatively plan to re-attempt once pt's daughter [name] arrives.  Please page [phone] to reach SLP.   HPI HPI: Pt is a 58 y.o. male with PMH significant for alcohol and polysubstance abuse, COPD, bipolar disorder, and chronic diastolic CHF, presented to ED with poor responsiveness, altered mental status and combativness. He reports to have been out drinking with friends. Pt has had recurring likely alcohol withdrawal seizures during admission. EEG on 1/31 revealed abnormal EEG secondary to general background slowing; finding may be seen with a diffuse disturbance that is etiologically nonspecific, but may include a metabolic encephalopathy or medication effect, among other possibilities. No epileptiform activity was noted. Intubated 1/24- 2/5. CXR 2/8 concerning for multilobar bronchopneumonia. Head CT 2/8 no acute findings.  Cortrak now in place.      SLP Plan  Continue with current plan of care       Recommendations  Diet recommendations: NPO Medication Administration: Via alternative means                Oral Care Recommendations: Oral care QID SLP Visit Diagnosis: Dysphagia, oropharyngeal phase (R13.12) Plan: Continue with current plan of care       GO                Kerrie Pleasure  Acute Rehabilitation  Services Office: [phone]; Pager (2/15): [phone] 01/07/2019, 8:58 AM

## 2019-01-07 NOTE — Progress Notes (Signed)
  Speech Language Pathology Treatment: Dysphagia  Patient Details Name: Jeffrey [name] MRN: [id number] DOB: [birth year] Today's Date: 01/07/2019 Time: 6073-7106 SLP Time Calculation (min) (ACUTE ONLY): 21 min  Assessment / Plan / Recommendation Clinical Impression  Returned to see pt with family in room.  Daughter [name] was present. Pt was awake on SLP arrival and was much more alert and interactive.  Pt nodded yes and no in response to some questions from family, and attempted to speak.  Pt's vocal quality sounds strong, although spontaneous utterances were largely unintelligible.  Pt brought up secretions and was able to expectorate.  SLP used yankauer to suction some secretions from oral cavity, but pt remains resistant to oral care.  Even with maximal encouragement from family, pt did not accept PO trials of puree or thin liquid by spoon.  Spontaneous swallow reflex was palpated in absence of PO trials, however.  Although pt is not yet ready for instrumental swallowing evaluation, today's reassessment does mark improvement from presentation on 01/05/19.  SLP will continue to follow for readiness for instrumental swallowing test and/or initiation of PO diet.    HPI HPI: Pt is a 58 y.o. male with PMH significant for alcohol and polysubstance abuse, COPD, bipolar disorder, and chronic diastolic CHF, presented to ED with poor responsiveness, altered mental status and combativness. He reports to have been out drinking with friends. Pt has had recurring likely alcohol withdrawal seizures during admission. EEG on 1/31 revealed abnormal EEG secondary to general background slowing; finding may be seen with a diffuse disturbance that is etiologically nonspecific, but may include a metabolic encephalopathy or medication effect, among other possibilities. No epileptiform activity was noted. Intubated 1/24- 2/5. CXR 2/8 concerning for multilobar bronchopneumonia. Head CT 2/8 no acute findings. Cortrak now in  place.      SLP Plan  Continue with current plan of care       Recommendations  Diet recommendations: NPO Medication Administration: Via alternative means                Oral Care Recommendations: Oral care QID SLP Visit Diagnosis: Dysphagia, oropharyngeal phase (R13.12) Plan: Continue with current plan of care       GO                Kerrie Pleasure, MA, CCC-SLP Acute Rehabilitation Services Office: [phone]; Pager (2/15): [phone] 01/07/2019, 3:41 PM

## 2019-01-07 NOTE — Progress Notes (Signed)
Received verbal order from Dr Jomarie Longs to stop the feeding and obtain Chest X ray due to pt 's cough

## 2019-01-08 DIAGNOSIS — K7200 Acute and subacute hepatic failure without coma: Secondary | ICD-10-CM

## 2019-01-08 DIAGNOSIS — Z515 Encounter for palliative care: Secondary | ICD-10-CM

## 2019-01-08 DIAGNOSIS — Z7189 Other specified counseling: Secondary | ICD-10-CM

## 2019-01-08 LAB — CBC WITH DIFFERENTIAL/PLATELET
Abs Immature Granulocytes: 0 10*3/uL (ref 0.00–0.07)
Band Neutrophils: 1 %
Basophils Absolute: 0 10*3/uL (ref 0.0–0.1)
Basophils Relative: 0 %
Eosinophils Absolute: 0.1 10*3/uL (ref 0.0–0.5)
Eosinophils Relative: 1 %
HCT: 35.3 % — ABNORMAL LOW (ref 39.0–52.0)
Hemoglobin: 11.3 g/dL — ABNORMAL LOW (ref 13.0–17.0)
Lymphocytes Relative: 3 %
Lymphs Abs: 0.4 10*3/uL — ABNORMAL LOW (ref 0.7–4.0)
MCH: 28.5 pg (ref 26.0–34.0)
MCHC: 32 g/dL (ref 30.0–36.0)
MCV: 88.9 fL (ref 80.0–100.0)
Monocytes Absolute: 0.9 10*3/uL (ref 0.1–1.0)
Monocytes Relative: 6 %
Neutro Abs: 13.3 10*3/uL — ABNORMAL HIGH (ref 1.7–7.7)
Neutrophils Relative %: 89 %
Platelets: 413 10*3/uL — ABNORMAL HIGH (ref 150–400)
RBC: 3.97 MIL/uL — ABNORMAL LOW (ref 4.22–5.81)
RDW: 12.9 % (ref 11.5–15.5)
WBC: 14.8 10*3/uL — ABNORMAL HIGH (ref 4.0–10.5)
nRBC: 0.3 % — ABNORMAL HIGH (ref 0.0–0.2)
nRBC: 2 /100 WBC — ABNORMAL HIGH

## 2019-01-08 LAB — BASIC METABOLIC PANEL
Anion gap: 11 (ref 5–15)
BUN: <5 mg/dL — ABNORMAL LOW (ref 6–20)
CO2: 25 mmol/L (ref 22–32)
Calcium: 8.1 mg/dL — ABNORMAL LOW (ref 8.9–10.3)
Chloride: 107 mmol/L (ref 98–111)
Creatinine, Ser: 0.55 mg/dL — ABNORMAL LOW (ref 0.61–1.24)
GFR calc Af Amer: >60 mL/min (ref >60)
GFR calc non Af Amer: >60 mL/min (ref >60)
Glucose, Bld: 91 mg/dL (ref 70–99)
Potassium: 3 mmol/L — ABNORMAL LOW (ref 3.5–5.1)
Sodium: 143 mmol/L (ref 135–145)

## 2019-01-08 LAB — CULTURE, BLOOD (ROUTINE X 2)
Culture: NO GROWTH
Culture: NO GROWTH

## 2019-01-08 LAB — GLUCOSE, CAPILLARY
Glucose-Capillary: 74 mg/dL (ref 70–99)
Glucose-Capillary: 86 mg/dL (ref 70–99)
Glucose-Capillary: 88 mg/dL (ref 70–99)

## 2019-01-08 MED ORDER — HALOPERIDOL LACTATE 5 MG/ML IJ SOLN
1.0000 mg | Freq: Four times a day (QID) | INTRAMUSCULAR | Status: DC | PRN
  Administered 2019-01-08: 1 mg via INTRAVENOUS
  Administered 2019-01-09 (×2): 2 mg via INTRAVENOUS
  Filled 2019-01-08 (×4): qty 1

## 2019-01-08 MED ORDER — GLYCOPYRROLATE 0.2 MG/ML IJ SOLN: 0.2000 mg | INTRAMUSCULAR | Status: DC | PRN

## 2019-01-08 MED ORDER — MORPHINE SULFATE (PF) 2 MG/ML IV SOLN
2.0000 mg | INTRAVENOUS | Status: DC | PRN
  Administered 2019-01-08 – 2019-01-09 (×3): 2 mg via INTRAVENOUS
  Filled 2019-01-08 (×3): qty 1

## 2019-01-08 MED ORDER — LORAZEPAM 2 MG/ML IJ SOLN: 0.5000 mg | INTRAMUSCULAR | Status: DC | PRN

## 2019-01-08 NOTE — Progress Notes (Signed)
Patient is transferring to 6N. Family has been notified. Report has been called. Jeffrey [name]

## 2019-01-08 NOTE — Progress Notes (Signed)
Patient has new rash on both legs. They are small, red and have a white head. Pt has not been seen itching. Dr. David Stall notified. Nurse will continue to monitor. Jeffrey [name]

## 2019-01-08 NOTE — Progress Notes (Signed)
  Speech Language Pathology Treatment: Dysphagia  Patient Details Name: Jeffrey [name] MRN: [id number] DOB: 1961-01-16 Today's Date: 01/08/2019 Time: 1093-2355 SLP Time Calculation (min) (ACUTE ONLY): 55 min  Assessment / Plan / Recommendation Clinical Impression  Session focused on caregiver education as pt lethargic and family requesting to hold POs at this time. Tube feedings were stopped as pt reportedly aspirated tube feeds overnight and now with new left infiltrate per CXR. SLP answered daughter [name]'s questions re: phyisiology of aspiration, risks for aspiration of tube feeding due to positioning limitations. As SLP discussing this with family, palliative care arrived to meet with family. Family now expressing desire for comfort feedings, seeking hospice placement, SLP provided recommendations for safest oral diet for comfort feeding; advise dysphagia 1, thin liquids (small bites and sips). Upright positioning, if pt able to tolerate, to minimize risks. Education completed and SLP to s/o at this time.      HPI HPI: Pt is a 58 y.o. male with PMH significant for alcohol and polysubstance abuse, COPD, bipolar disorder, and chronic diastolic CHF, presented to ED with poor responsiveness, altered mental status and combativness. He reports to have been out drinking with friends. Pt has had recurring likely alcohol withdrawal seizures during admission. EEG on 1/31 revealed abnormal EEG secondary to general background slowing; finding may be seen with a diffuse disturbance that is etiologically nonspecific, but may include a metabolic encephalopathy or medication effect, among other possibilities. No epileptiform activity was noted. Intubated 1/24- 2/5. CXR 2/8 concerning for multilobar bronchopneumonia. Head CT 2/8 no acute findings. Cortrak now in place.      SLP Plan  Discharge SLP treatment due to (comment)(plan for comfort measures)       Recommendations  Diet recommendations: Dysphagia  1 (puree);Thin liquid Liquids provided via: Cup;Teaspoon Medication Administration: Via alternative means Compensations: Slow rate;Small sips/bites;Minimize environmental distractions Postural Changes and/or Swallow Maneuvers: (upright)                Oral Care Recommendations: Oral care QID Follow up Recommendations: Other (comment)(hospice) Plan: Discharge SLP treatment due to (comment)(plan for comfort measures)       GO              Rondel Baton, MS, CCC-SLP Speech-Language Pathologist Acute Rehabilitation Services Pager: [phone] Office: [phone]   Arlana Lindau 01/08/2019, 1:11 PM

## 2019-01-08 NOTE — Progress Notes (Signed)
Patient's family is refusing vital signs and blood sugar checks at this time. Nurse will continue to monitor. Jeffrey [name] Brittainy Bucker

## 2019-01-08 NOTE — Progress Notes (Addendum)
[facility] PROGRESS NOTE    Progress Note  Jeffrey [name]  ZOX:[id number] DOB: 06/06/1961 DOA: 12/16/2018 PCP: Gareth Morgan, MD     Brief Narrative:   Jeffrey [name] is an 58 y.o. 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 MRI brain on 12/22/2018 and was unrevealing. Continue thiamine and folate. Continue Depakote. Family is unrealistic about expectations they want full aggressive therapy and a PEG tube. Chest x-ray done overnight shows a new infiltrates on the left. Physical therapy was consulted recommended skilled nursing facility.  Aspiration pneumonia: Had an episode of coughing overnight, became a little bit agitated which resolved quickly. X-ray was done that shows a new left lower lobe infiltrate. We will stop tube feedings discussed with the family that he is aspirating. He has remained afebrile, but he slightly tachycardic. We will hold antibiotics for now, check CBC.  Alcohol use: Continue Ativan protocol. No signs of withdrawal.  Diarrhea: C. difficile negative.  COPD, severe (HCC) Well compensated.  Bipolar disorder with severe depression (HCC) Continue Depacon.  Chronic diastolic CHF (congestive heart failure) (HCC) Appears to be euvolemic.  Acute respiratory failure with hypoxemia (HCC) Due to pneumonia status post extubation. Saturations have remained stable.  Hypernatremia: Now resolved KVO IV fluids.  Hypokalemia: Repleted IV now improved.  New acute kidney injury: Prerenal azotemia resolved with  IV fluid hydration. Creatinine back to baseline  Nutrition: Core track in place nutrition has been started. We will start free water flushes per tube. We will have to stop feeding, as he had an episode of coughing and a chest x-ray shows new left infiltrate.  DVT prophylaxis: lovenox Family Communication:none Disposition Plan/Barrier to D/C: unable to determine Code Status:     Code Status Orders  (From admission, onward)         Start     Ordered   12/16/18 1213  Full code  Continuous     12/16/18 1216        Code Status History    Date Active Date Inactive Code Status Order ID Comments User Context   12/16/2018 0429 12/16/2018 1216 Full Code [id number]  Briscoe Deutscher, MD ED   09/16/2018 1536 09/17/2018 1342 Full Code [id number]  Maurilio Lovely D, DO ED   06/16/2018 0750 06/17/2018 1356 DNR [id number]  Meredeth Ide, MD Inpatient   01/31/2018 2049 02/08/2018 2202 Full Code [id number]  Kerry Hough, PA-C Inpatient   01/31/2018 0533 01/31/2018 1850 Full Code [id number]  Zadie Rhine, MD ED   04/07/2017 2311 04/15/2017 1357 Full Code [id number]  Alberteen Sam, MD Inpatient   02/07/2017 2313 02/10/2017 1645 Full Code [id number]  Haydee Monica, MD Inpatient        IV Access:    Peripheral IV   Procedures and diagnostic studies:   Dg Chest Port 1 View  Result Date: 01/08/2019 CLINICAL DATA:  Assess for aspiration pneumonia. EXAM: PORTABLE CHEST 1 VIEW COMPARISON:  December 31, 2018 FINDINGS: The heart size and mediastinal contours are within normal limits. There is interstitial edema. Mild patchy opacity of the left lung base is identified. Feeding tube is identified with distal tip  not included on the film. The visualized skeletal structures are unremarkable. IMPRESSION: Mild interstitial edema. Patchy opacity of the left lung base superimposed pneumonia is not excluded. Electronically Signed   By: Sherian ReinWei-Chen  Lin M.D.   On: 01/08/2019 00:28   Dg Abd Portable 1v  Result  Date: 01/06/2019 CLINICAL DATA:  Feeding tube placement. EXAM: PORTABLE ABDOMEN - 1 VIEW COMPARISON:  Radiographs 12/23/2018. FINDINGS: 1535 hours. Feeding tube projects below the GE junction, tip at the gastric fundus. Multiple telemetry leads overlie the lower chest and abdomen. The visualized bowel gas pattern is normal. IMPRESSION: Tip of the feeding tube in the proximal stomach. Electronically Signed   By: Carey BullocksWilliam  Veazey M.D.   On: 01/06/2019 16:14     Medical Consultants:    None.  Anti-Infectives:   None  Subjective:    Jeffrey [name] lethargic nonverbal.  Objective:    Vitals:   01/07/19 2000 01/07/19 2308 01/08/19 0358 01/08/19 0500  BP: (!) 141/89 (!) 155/65 (!) 151/85   Pulse: (!) 101 100 99   Resp: 20 18 17    Temp: 97.8 F (36.6 C) 98.4 F (36.9 C) 99.1 F (37.3 C)   TempSrc: Axillary Oral Oral   SpO2: 94% 100% 100%   Weight:    67.6 kg  Height:       No intake or output data in the 24 hours ending 01/08/19 0815 Filed Weights   01/06/19 0500 01/07/19 0500 01/08/19 0500  Weight: 59 kg 61.2 kg 67.6 kg    Exam: General exam: In no acute distress. Respiratory system: Good air movement and clear to auscultation. Cardiovascular system: S1 & S2 heard, RRR. Marland Kitchen.  Gastrointestinal system: Abdomen is nondistended, soft and nontender.  Central nervous system: Moving all 4 extremities not following commands, patient is currently lethargic. Extremities: No pedal edema. Skin: No rashes, lesions or ulcers   Data Reviewed:    Labs: Basic Metabolic Panel: Recent Labs  Lab 01/02/19 0611 01/03/19 0446  01/04/19 0551 01/04/19 1012 01/05/19 0632 01/05/19 1422 01/06/19 0601 01/06/19 1934 01/07/19 0653  NA 152* 155*   < > 153*  --  152* 151* 149*  --  147*  K 3.4* 3.1*   < > 2.9*  --  4.6 4.2 3.7  --  3.7  CL 117* 123*   < > 117*  --  116* 118* 118*  --  118*  CO2 22 23   < > 29  --  24 29 22   --  23  GLUCOSE 130* 115*   < > 101*  --  100* 110* 83  --  83    BUN 13 8   < > 6  --  7 6 5*  --  5*  CREATININE 0.61 0.75   < > 0.62  --  1.75* 0.77 0.63  --  0.77  CALCIUM 8.2* 8.6*   < > 8.3*  --  8.5* 8.5* 8.3*  --  8.0*  MG 2.2 2.0  --   --  1.9  --   --   --   --   --   PHOS  --   --   --   --   --   --   --   --  3.7 3.7   < > = values in this interval not displayed.   GFR Estimated Creatinine Clearance: 90.8 mL/min (by C-G formula based on SCr of 0.77 mg/dL). Liver Function Tests: Recent Labs  Lab 01/02/19 [id number] 01/03/19 0446  AST 18 17  ALT 22 19  ALKPHOS 135* 125  BILITOT 0.5 0.7  PROT 5.7* 5.7*  ALBUMIN 2.1* 2.0*   No results for input(s): LIPASE, AMYLASE in the last 168 hours. Recent Labs  Lab 01/03/19 0446 01/05/19 0632  AMMONIA 46* 25   Coagulation profile No results for input(s): INR, PROTIME in the last 168 hours.  CBC: Recent Labs  Lab 01/02/19 0611 01/03/19 0446 01/04/19 0551 01/05/19 0632  WBC 4.2 5.2 4.3 4.8  NEUTROABS 3.1  --  2.8 3.2  HGB 11.1* 10.7* 11.1* 10.7*  HCT 36.0* 34.6* 35.1* 34.6*  MCV 92.1 91.8 92.1 92.3  PLT 329 382 359 391   Cardiac Enzymes: No results for input(s): CKTOTAL, CKMB, CKMBINDEX, TROPONINI in the last 168 hours. BNP (last 3 results) No results for input(s): PROBNP in the last 8760 hours. CBG: Recent Labs  Lab 01/07/19 1731 01/07/19 1953 01/07/19 2304 01/08/19 0402 01/08/19 0738  GLUCAP 81 76 81 74 86   D-Dimer: No results for input(s): DDIMER in the last 72 hours. Hgb A1c: No results for input(s): HGBA1C in the last 72 hours. Lipid Profile: No results for input(s): CHOL, HDL, LDLCALC, TRIG, CHOLHDL, LDLDIRECT in the last 72 hours. Thyroid function studies: No results for input(s): TSH, T4TOTAL, T3FREE, THYROIDAB in the last 72 hours.  Invalid input(s): FREET3 Anemia work up: No results for input(s): VITAMINB12, FOLATE, FERRITIN, TIBC, IRON, RETICCTPCT in the last 72 hours. Sepsis Labs: Recent Labs  Lab 01/02/19 5625 01/03/19 0446 01/04/19 0551  01/05/19 0632  WBC 4.2 5.2 4.3 4.8   Microbiology Recent Results (from the past 240 hour(s))  Culture, Urine     Status: Abnormal   Collection Time: 12/31/18 11:26 PM  Result Value Ref Range Status   Specimen Description URINE, RANDOM  Final   Special Requests   Final    NONE Performed at [facility], [address], [place], [place] [address]    Culture STAPHYLOCOCCUS EPIDERMIDIS (A)  Final   Report Status 01/03/2019 FINAL  Final   Organism ID, Bacteria STAPHYLOCOCCUS EPIDERMIDIS  Final      Susceptibility   Staphylococcus epidermidis - MIC*    CIPROFLOXACIN <=0.5 SENSITIVE Sensitive     GENTAMICIN <=0.5 SENSITIVE Sensitive     NITROFURANTOIN 64 INTERMEDIATE Intermediate     OXACILLIN >=4 RESISTANT Resistant     TETRACYCLINE <=1 SENSITIVE Sensitive     VANCOMYCIN <=0.5 SENSITIVE Sensitive     TRIMETH/SULFA <=10 SENSITIVE Sensitive     CLINDAMYCIN >=8 RESISTANT Resistant     RIFAMPIN 16 RESISTANT Resistant     Inducible Clindamycin NEGATIVE Sensitive     * STAPHYLOCOCCUS EPIDERMIDIS  Culture, blood (routine x 2)     Status: None   Collection Time: 01/01/19 12:33 AM  Result Value Ref Range Status   Specimen Description BLOOD LEFT ANTECUBITAL  Final   Special Requests   Final    BOTTLES DRAWN AEROBIC AND ANAEROBIC Blood Culture results may not be optimal due to an inadequate volume of blood received in culture bottles   Culture   Final    NO GROWTH 5 DAYS Performed at [facility], [address] [place], [place] [address]    Report Status 01/06/2019 FINAL  Final  Culture, blood (routine x 2)     Status: Abnormal   Collection Time: 01/01/19 12:33 AM  Result Value Ref Range Status   Specimen Description BLOOD LEFT HAND  Final   Special Requests   Final  BOTTLES DRAWN AEROBIC AND ANAEROBIC Blood Culture adequate volume   Culture  Setup Time   Final    GRAM POSITIVE COCCI AEROBIC BOTTLE ONLY CRITICAL RESULT CALLED TO, READ BACK BY AND VERIFIED WITH: PHARMD  [facility] [id number] FCP    Culture (A)  Final    STAPHYLOCOCCUS SPECIES (COAGULASE NEGATIVE) THE SIGNIFICANCE OF ISOLATING THIS ORGANISM FROM A SINGLE SET OF BLOOD CULTURES WHEN MULTIPLE SETS ARE DRAWN IS UNCERTAIN. PLEASE NOTIFY THE MICROBIOLOGY DEPARTMENT WITHIN ONE WEEK IF SPECIATION AND SENSITIVITIES ARE REQUIRED. Performed at [facility], [address], [place], [place] [address]    Report Status 01/03/2019 FINAL  Final  Blood Culture ID Panel (Reflexed)     Status: Abnormal   Collection Time: 01/01/19 12:33 AM  Result Value Ref Range Status   Enterococcus species NOT DETECTED NOT DETECTED Final   Listeria monocytogenes NOT DETECTED NOT DETECTED Final   Staphylococcus species DETECTED (A) NOT DETECTED Final    Comment: Methicillin (oxacillin) resistant coagulase negative staphylococcus. Possible blood culture contaminant (unless isolated from more than one blood culture draw or clinical case suggests pathogenicity). No antibiotic treatment is indicated for blood  culture contaminants. CRITICAL RESULT CALLED TO, READ BACK BY AND VERIFIED WITH: PHARMD [facility] [id number] FCP    Staphylococcus aureus (BCID) NOT DETECTED NOT DETECTED Final   Methicillin resistance DETECTED (A) NOT DETECTED Final    Comment: CRITICAL RESULT CALLED TO, READ BACK BY AND VERIFIED WITH: PHARMD [facility] [id number] FCP    Streptococcus species NOT DETECTED NOT DETECTED Final   Streptococcus agalactiae NOT DETECTED NOT DETECTED Final   Streptococcus pneumoniae NOT DETECTED NOT DETECTED Final   Streptococcus pyogenes NOT DETECTED NOT DETECTED Final   Acinetobacter baumannii NOT DETECTED NOT DETECTED Final   Enterobacteriaceae species NOT DETECTED NOT DETECTED Final   Enterobacter cloacae complex NOT DETECTED NOT DETECTED Final   Escherichia coli NOT DETECTED NOT DETECTED Final   Klebsiella oxytoca NOT DETECTED NOT DETECTED Final   Klebsiella pneumoniae NOT DETECTED NOT DETECTED Final   Proteus species NOT  DETECTED NOT DETECTED Final   Serratia marcescens NOT DETECTED NOT DETECTED Final   Haemophilus influenzae NOT DETECTED NOT DETECTED Final   Neisseria meningitidis NOT DETECTED NOT DETECTED Final   Pseudomonas aeruginosa NOT DETECTED NOT DETECTED Final   Candida albicans NOT DETECTED NOT DETECTED Final   Candida glabrata NOT DETECTED NOT DETECTED Final   Candida krusei NOT DETECTED NOT DETECTED Final   Candida parapsilosis NOT DETECTED NOT DETECTED Final   Candida tropicalis NOT DETECTED NOT DETECTED Final  C difficile quick scan w PCR reflex     Status: None   Collection Time: 01/01/19 10:23 AM  Result Value Ref Range Status   C Diff antigen NEGATIVE NEGATIVE Final   C Diff toxin NEGATIVE NEGATIVE Final   C Diff interpretation No C. difficile detected.  Final    Comment: Performed at [facility], [address] [place], [place] [address]  Culture, blood (routine x 2)     Status: None   Collection Time: 01/03/19 12:38 PM  Result Value Ref Range Status   Specimen Description BLOOD RIGHT HAND  Final   Special Requests   Final    BOTTLES DRAWN AEROBIC AND ANAEROBIC Blood Culture results may not be optimal due to an inadequate volume of blood received in culture bottles   Culture   Final    NO GROWTH 5 DAYS Performed at [facility], [address], [place], [place] [address]    Report Status 01/08/2019 FINAL  Final  Culture, blood (routine x 2)     Status: None   Collection Time: 01/03/19 12:38 PM  Result Value Ref Range Status   Specimen Description BLOOD RIGHT ARM  Final   Special Requests   Final    BOTTLES DRAWN AEROBIC ONLY Blood Culture results may not be optimal due to an inadequate volume of blood received in culture bottles   Culture   Final    NO GROWTH 5 DAYS Performed at [facility], [address], [place], [place] [address]    Report Status 01/08/2019 FINAL  Final     Medications:   . chlorhexidine  15 mL Mouth Rinse BID  . cloNIDine  0.3  mg Transdermal Weekly  . enoxaparin (LOVENOX) injection  40 mg Subcutaneous Q24H  . feeding supplement (PRO-STAT SUGAR FREE 64)  30 mL Per Tube BID  . folic acid  1 mg Intravenous Daily  . free water  200 mL Per Tube Q8H  . insulin aspart  0-9 Units Subcutaneous Q4H  . mouth rinse  15 mL Mouth Rinse BID  . mouth rinse  15 mL Mouth Rinse q12n4p  . pantoprazole (PROTONIX) IV  40 mg Intravenous Q24H  . potassium & sodium phosphates  2 packet Oral TID  . sodium chloride flush  10-40 mL Intracatheter Q12H  . sodium chloride flush  3 mL Intravenous Q12H  . thiamine injection  100 mg Intravenous Daily   Continuous Infusions: . dextrose 5 % and 0.45% NaCl 100 mL/hr at 01/08/19 0253  . feeding supplement (OSMOLITE 1.5 CAL) 55 mL/hr at 01/07/19 1746  . valproate sodium 500 mg (01/08/19 0516)      LOS: 23 days   Marinda Elk  [facility]  01/08/2019, 8:15 AM

## 2019-01-08 NOTE — Progress Notes (Signed)
  Patient seen, chart reviewed.  [name], Jeffrey [name] and son Jeffrey [name] are at the bedside.  Aspirated tube feedings probably overnight.  Chest x-ray shows a new left infiltrate.  Patient is still minimally responsive.  His eyes are open but he is nonverbal and unable to follow commands.  Speech was unable to get patient to accept any boluses by mouth  Met with patient's daughter and son and talked about moving towards comfort measures.  Daughter Jeffrey [name] wanted to feel as though she had "done everything possible".  She sees that despite doing antibiotics, artificial feeding, her father is unable to tolerate those interventions and continues to decline.  At this point she is opting for comfort care. Family is requesting a DNR  We talked about next steps specifically residential hospice in [place].  Family was in favor of that.  Until we see when bed is available they are requesting transfer to the [facility] where it will be easier for family to stay with the patient.  Patient Profile Jeffrey [name] is an 58 y.o. male past medical history of bipolar disorder, COPD diastolic heart failure and polysubstance abuse who presents with respiratory failure found to have Klebsiella pneumonia was intubated on 12/16/2018 extubated on 12/28/2018 and he has completed  course of antibiotics,  also had alcohol withdrawal seizures on 12/19/2018 and then again on 12/22/2018  Consult ordered for goals of care, end-of-life.  Despite antibiotics and trial of tube feeds patient continues to aspirate.  Family is opting for comfort care  Assessment/Plan  Despite aggressive interventions, antibiotics, artificial feeding, patient has remained encephalopathic and has now likely aspirated tube feedings overnight and shows a new infiltrate.  Family is recognizing that he is nearing end-of-life and is opting for comfort care.  I prepared them for a prognosis of days to weeks; patient is high risk for acute decline  secondary to aspiration pneumonia and currently has a new infiltrate.  Family is aware that hospice facilities will not offer IV antibiotics, IV fluids, transfusion and is there for end-of-life care.  Patient now DNR Comfort meds in place for pain as well as respiratory distress Allow patient to eat for comfort.  Speech recommendations are in chart for the safest possible feeding mechanism if he were to become more alert and want to eat Injectable medicines are in place this patient is no longer  able to take anything by mouth Social work consult placed for residential hospice in [place].  I have notified social work directly today In the interim, family is requesting transfer to [address] as well as triple X status.  Will call admitting to facilitate this  Thank you, Romona Curls, NP Total time: 40 minutes Greater than 50% of time spent in counseling and coordination of care

## 2019-01-08 NOTE — Progress Notes (Signed)
CSW received a phone call from palliative nurse. The family is requesting a facility placement in [facility]. CSW called [facility] and made the referral. Someone will be calling back with the CSW.   CSW will continue to follow.   Drucilla Schmidt, MSW, LCSW-A Clinical Social Worker [facility]

## 2019-01-08 NOTE — Progress Notes (Signed)
[facility]. They could possibly have a bed on Monday. CSW faxed over patient paperwork.   Someone will need to follow up on Monday to see if they have a bed available. Facility phone number is [phone]. CSW spoke with Robin.   CSW informed the family that the patient could possibly discharge to the facility once they have a bed for him.   CSW will continue to follow.   Drucilla Schmidt, MSW, LCSW-A Clinical Social Worker [facility] [employer]

## 2019-01-09 ENCOUNTER — Inpatient Hospital Stay (HOSPITAL_COMMUNITY): Payer: Medicare Other

## 2019-01-09 MED ORDER — DIVALPROEX SODIUM 500 MG PO DR TAB
500.0000 mg | DELAYED_RELEASE_TABLET | Freq: Two times a day (BID) | ORAL | Status: DC
  Filled 2019-01-09: qty 1

## 2019-01-09 MED ORDER — PANTOPRAZOLE SODIUM 40 MG PO PACK
40.0000 mg | PACK | Freq: Every day | ORAL | Status: DC
  Administered 2019-01-11 – 2019-01-12 (×2): 40 mg
  Filled 2019-01-09 (×4): qty 20

## 2019-01-09 MED ORDER — FREE WATER
200.0000 mL | Freq: Three times a day (TID) | Status: DC
  Administered 2019-01-09 – 2019-01-11 (×5): 200 mL

## 2019-01-09 MED ORDER — VALPROIC ACID 250 MG/5ML PO SOLN
750.0000 mg | Freq: Two times a day (BID) | ORAL | Status: DC
  Administered 2019-01-09 – 2019-01-12 (×7): 750 mg via ORAL
  Filled 2019-01-09 (×8): qty 15

## 2019-01-09 MED ORDER — DIVALPROEX SODIUM 500 MG PO DR TAB
750.0000 mg | DELAYED_RELEASE_TABLET | Freq: Two times a day (BID) | ORAL | Status: DC
  Filled 2019-01-09: qty 1

## 2019-01-09 MED ORDER — MORPHINE SULFATE (PF) 2 MG/ML IV SOLN
1.0000 mg | INTRAVENOUS | Status: AC | PRN
  Administered 2019-01-09 – 2019-01-10 (×2): 1 mg via INTRAVENOUS
  Filled 2019-01-09 (×2): qty 1

## 2019-01-09 NOTE — Social Work (Addendum)
10:04am- Pt being reviewed by MD at [facility].   8:49am- CSW f/u with [facility], no staff currently available due to meeting, will re-follow up after 9:30am for open bed.  Octavio Graves, MSW, [facility] Clinical Social Work [phone]

## 2019-01-09 NOTE — Progress Notes (Signed)
Upon return from CT scan, pt verbalized to family "give me some water". Pt was able to sit on edge of bed with very little assistance and hold the cup and drank 4 sips of water per family. Pt able to stand edge of bed with minimal assist. Pt alert and conversing minimally with family, slurred at times and slow response time. Paged Dr. David Stall to inform him and request consult for PT as well as re-consult for speech to evaluate patient in this new state.

## 2019-01-09 NOTE — Progress Notes (Addendum)
[facility] PROGRESS NOTE    Progress Note  Jeffrey [name]  XBJ:[id number] DOB: May 28, 1961 DOA: 12/16/2018 PCP: Gareth Morgan, MD     Brief Narrative:   Jeffrey [name] is an 58 y.o. 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 MRI brain on 12/22/2018 and was unrevealing. Continue thiamine and folate. Resume  Depakote. Conversation over 35 minutes with the family this morning, the daughter wants to revert everything.  After having a long conversation with her as she wants full aggressive treatment. She wants to start tube feedings and place a PEG tube and having placed at a skilled nursing facility temporarily. We will go ahead and consult nutrition start tube feedings. We will consult IR for PEG tube placement. Splane to her the risk and benefits of doing that she seems to understand and she states she is never getting up.  Aspiration pneumonia: Had an episode of coughing overnight, became a little bit agitated which resolved quickly. X-ray was done that shows a new left lower lobe infiltrate. Has remained afebrile.  Alcohol use: Continue Ativan protocol. No signs of withdrawal.  Diarrhea: C. difficile negative.  COPD, severe (HCC) Well compensated.  Bipolar disorder with severe depression (HCC) Continue Depacon.  Chronic diastolic CHF (congestive heart failure) (HCC) Appears to be euvolemic.  Acute respiratory failure with hypoxemia (HCC) Due to pneumonia status post extubation. Saturations have remained  stable.  Hypernatremia: Now resolved KVO IV fluids.  Hypokalemia: Repleted IV now improved.  New acute kidney injury: Prerenal azotemia resolved with IV fluid hydration. Creatinine back to baseline  Nutrition: Reconsult nutritionist, start free water flushes. Below for further details.   DVT prophylaxis: lovenox Family Communication:none Disposition Plan/Barrier to D/C: unable to determine Code Status:     Code Status Orders  (From admission, onward)         Start     Ordered   12/16/18 1213  Full code  Continuous     12/16/18 1216        Code Status History    Date Active Date Inactive Code Status Order ID Comments User Context   12/16/2018 0429 12/16/2018 1216 Full Code [id number]  Briscoe Deutscher, MD ED   09/16/2018 1536 09/17/2018 1342 Full Code [id number]  Maurilio Lovely D, DO ED   06/16/2018 0750 06/17/2018 1356 DNR [id number]  Meredeth Ide, MD Inpatient   01/31/2018 2049 02/08/2018 2202 Full Code [id number]  Kerry Hough, PA-C Inpatient   01/31/2018 0533 01/31/2018 1850 Full Code [id number]  Zadie Rhine, MD ED   04/07/2017 2311 04/15/2017 1357 Full Code [id number]  Alberteen Sam, MD Inpatient   02/07/2017 2313 02/10/2017 1645 Full Code [id number]  Haydee Monica, MD Inpatient        IV Access:    Peripheral IV   Procedures and diagnostic studies:   Dg Chest Port 1 View  Result Date: 01/08/2019 CLINICAL DATA:  Assess for aspiration pneumonia. EXAM: PORTABLE CHEST 1 VIEW COMPARISON:  December 31, 2018 FINDINGS: The heart size and mediastinal contours are within normal limits. There is interstitial edema. Mild patchy opacity of the left lung base is identified. Feeding tube  is identified with distal tip not included on the film. The visualized skeletal structures are unremarkable. IMPRESSION: Mild interstitial edema. Patchy opacity of the left lung base superimposed pneumonia is not excluded. Electronically Signed   By: Sherian Rein M.D.   On: 01/08/2019  00:28     Medical Consultants:    None.  Anti-Infectives:   None  Subjective:    [name] sleepy.  Objective:    Vitals:   01/08/19 0840 01/08/19 1700 01/09/19 0500 01/09/19 0628  BP: (!) 104/55 137/76  138/76  Pulse: 93 97  89  Resp: 16 16  16   Temp: 99.6 F (37.6 C) 98.1 F (36.7 C)  97.6 F (36.4 C)  TempSrc: Axillary Axillary  Oral  SpO2: 95% 100%  93%  Weight:   67.6 kg   Height:        Intake/Output Summary (Last 24 hours) at 01/09/2019 1033 Last data filed at 01/09/2019 0848 Gross per 24 hour  Intake 2548.67 ml  Output 200 ml  Net 2348.67 ml   Filed Weights   01/07/19 0500 01/08/19 0500 01/09/19 0500  Weight: 61.2 kg 67.6 kg 67.6 kg    Exam: General exam: In no acute distress. Respiratory system: Good air movement and clear to auscultation. Cardiovascular system: S1 & S2 heard, RRR. Marland Kitchen  Gastrointestinal system: Abdomen is nondistended, soft and nontender.  Central nervous system: Moving all 4 extremities not following commands, patient is currently lethargic. Extremities: No pedal edema. Skin: No rashes, lesions or ulcers   Data Reviewed:    Labs: Basic Metabolic Panel: Recent Labs  Lab 01/03/19 0446  01/04/19 1012 01/05/19 0632 01/05/19 1422 01/06/19 0601 01/06/19 1934 01/07/19 0653 01/08/19 0839  NA 155*   < >  --  152* 151* 149*  --  147* 143  K 3.1*   < >  --  4.6 4.2 3.7  --  3.7 3.0*  CL 123*   < >  --  116* 118* 118*  --  118* 107  CO2 23   < >  --  24 29 22   --  23 25  GLUCOSE 115*   < >  --  100* 110* 83  --  83 91  BUN 8   < >  --  7 6 5*  --  5* <5*  CREATININE 0.75   < >  --  1.75* 0.77 0.63  --  0.77 0.55*  CALCIUM 8.6*   < >  --  8.5* 8.5* 8.3*  --  8.0* 8.1*  MG 2.0  --  1.9  --   --   --   --   --   --   PHOS  --   --   --   --   --   --  3.7 3.7  --    < > = values in this interval not displayed.   GFR Estimated Creatinine Clearance: 90.8 mL/min (A) (by C-G formula based on SCr of 0.55 mg/dL (L)). Liver  Function Tests: Recent Labs  Lab 01/03/19 0446  AST 17  ALT 19  ALKPHOS 125  BILITOT 0.7  PROT 5.7*  ALBUMIN 2.0*   No results for input(s): LIPASE, AMYLASE in the last 168 hours. Recent Labs  Lab 01/03/19 0446 01/05/19 0632  AMMONIA 46* 25   Coagulation profile No results for input(s): INR, PROTIME in the last 168 hours.  CBC: Recent Labs  Lab 01/03/19 0446 01/04/19 0551 01/05/19 0632 01/08/19 0839  WBC  5.2 4.3 4.8 14.8*  NEUTROABS  --  2.8 3.2 13.3*  HGB 10.7* 11.1* 10.7* 11.3*  HCT 34.6* 35.1* 34.6* 35.3*  MCV 91.8 92.1 92.3 88.9  PLT 382 359 391 413*   Cardiac Enzymes: No results for input(s): CKTOTAL, CKMB, CKMBINDEX, TROPONINI in the last 168 hours. BNP (last 3 results) No results for input(s): PROBNP in the last 8760 hours. CBG: Recent Labs  Lab 01/07/19 1953 01/07/19 2304 01/08/19 0402 01/08/19 0738 01/08/19 0836  GLUCAP 76 81 74 86 88   D-Dimer: No results for input(s): DDIMER in the last 72 hours. Hgb A1c: No results for input(s): HGBA1C in the last 72 hours. Lipid Profile: No results for input(s): CHOL, HDL, LDLCALC, TRIG, CHOLHDL, LDLDIRECT in the last 72 hours. Thyroid function studies: No results for input(s): TSH, T4TOTAL, T3FREE, THYROIDAB in the last 72 hours.  Invalid input(s): FREET3 Anemia work up: No results for input(s): VITAMINB12, FOLATE, FERRITIN, TIBC, IRON, RETICCTPCT in the last 72 hours. Sepsis Labs: Recent Labs  Lab 01/03/19 0446 01/04/19 0551 01/05/19 0632 01/08/19 0839  WBC 5.2 4.3 4.8 14.8*   Microbiology Recent Results (from the past 240 hour(s))  Culture, Urine     Status: Abnormal   Collection Time: 12/31/18 11:26 PM  Result Value Ref Range Status   Specimen Description URINE, RANDOM  Final   Special Requests   Final    NONE Performed at [facility], [address] [place], [place] [address]    Culture STAPHYLOCOCCUS EPIDERMIDIS (A)  Final   Report Status 01/03/2019 FINAL  Final   Organism  ID, Bacteria STAPHYLOCOCCUS EPIDERMIDIS  Final      Susceptibility   Staphylococcus epidermidis - MIC*    CIPROFLOXACIN <=0.5 SENSITIVE Sensitive     GENTAMICIN <=0.5 SENSITIVE Sensitive     NITROFURANTOIN 64 INTERMEDIATE Intermediate     OXACILLIN >=4 RESISTANT Resistant     TETRACYCLINE <=1 SENSITIVE Sensitive     VANCOMYCIN <=0.5 SENSITIVE Sensitive     TRIMETH/SULFA <=10 SENSITIVE Sensitive     CLINDAMYCIN >=8 RESISTANT Resistant     RIFAMPIN 16 RESISTANT Resistant     Inducible Clindamycin NEGATIVE Sensitive     * STAPHYLOCOCCUS EPIDERMIDIS  Culture, blood (routine x 2)     Status: None   Collection Time: 01/01/19 12:33 AM  Result Value Ref Range Status   Specimen Description BLOOD LEFT ANTECUBITAL  Final   Special Requests   Final    BOTTLES DRAWN AEROBIC AND ANAEROBIC Blood Culture results may not be optimal due to an inadequate volume of blood received in culture bottles   Culture   Final    NO GROWTH 5 DAYS Performed at [facility], [address] [place], [place] [address]    Report Status 01/06/2019 FINAL  Final  Culture, blood (routine x 2)     Status: Abnormal   Collection Time: 01/01/19 12:33 AM  Result Value Ref Range Status   Specimen Description BLOOD LEFT HAND  Final   Special Requests   Final    BOTTLES DRAWN AEROBIC AND ANAEROBIC Blood Culture adequate volume   Culture  Setup Time   Final    GRAM POSITIVE COCCI AEROBIC BOTTLE ONLY CRITICAL RESULT CALLED TO, READ BACK BY AND VERIFIED WITH: PHARMD [facility] [id number] FCP    Culture (A)  Final    STAPHYLOCOCCUS SPECIES (COAGULASE NEGATIVE) THE SIGNIFICANCE OF ISOLATING THIS ORGANISM FROM A SINGLE SET OF BLOOD CULTURES WHEN MULTIPLE SETS ARE DRAWN IS UNCERTAIN.  PLEASE NOTIFY THE MICROBIOLOGY DEPARTMENT WITHIN ONE WEEK IF SPECIATION AND SENSITIVITIES ARE REQUIRED. Performed at [facility], [address] [place], [place] [address]    Report Status 01/03/2019 FINAL  Final  Blood Culture ID Panel  (Reflexed)     Status: Abnormal   Collection Time: 01/01/19 12:33 AM  Result Value Ref Range Status   Enterococcus species NOT DETECTED NOT DETECTED Final   Listeria monocytogenes NOT DETECTED NOT DETECTED Final   Staphylococcus species DETECTED (A) NOT DETECTED Final    Comment: Methicillin (oxacillin) resistant coagulase negative staphylococcus. Possible blood culture contaminant (unless isolated from more than one blood culture draw or clinical case suggests pathogenicity). No antibiotic treatment is indicated for blood  culture contaminants. CRITICAL RESULT CALLED TO, READ BACK BY AND VERIFIED WITH: PHARMD [facility] [id number] FCP    Staphylococcus aureus (BCID) NOT DETECTED NOT DETECTED Final   Methicillin resistance DETECTED (A) NOT DETECTED Final    Comment: CRITICAL RESULT CALLED TO, READ BACK BY AND VERIFIED WITH: PHARMD [facility] [id number] FCP    Streptococcus species NOT DETECTED NOT DETECTED Final   Streptococcus agalactiae NOT DETECTED NOT DETECTED Final   Streptococcus pneumoniae NOT DETECTED NOT DETECTED Final   Streptococcus pyogenes NOT DETECTED NOT DETECTED Final   Acinetobacter baumannii NOT DETECTED NOT DETECTED Final   Enterobacteriaceae species NOT DETECTED NOT DETECTED Final   Enterobacter cloacae complex NOT DETECTED NOT DETECTED Final   Escherichia coli NOT DETECTED NOT DETECTED Final   Klebsiella oxytoca NOT DETECTED NOT DETECTED Final   Klebsiella pneumoniae NOT DETECTED NOT DETECTED Final   Proteus species NOT DETECTED NOT DETECTED Final   Serratia marcescens NOT DETECTED NOT DETECTED Final   Haemophilus influenzae NOT DETECTED NOT DETECTED Final   Neisseria meningitidis NOT DETECTED NOT DETECTED Final   Pseudomonas aeruginosa NOT DETECTED NOT DETECTED Final   Candida albicans NOT DETECTED NOT DETECTED Final   Candida glabrata NOT DETECTED NOT DETECTED Final   Candida krusei NOT DETECTED NOT DETECTED Final   Candida parapsilosis NOT DETECTED NOT DETECTED Final    Candida tropicalis NOT DETECTED NOT DETECTED Final  C difficile quick scan w PCR reflex     Status: None   Collection Time: 01/01/19 10:23 AM  Result Value Ref Range Status   C Diff antigen NEGATIVE NEGATIVE Final   C Diff toxin NEGATIVE NEGATIVE Final   C Diff interpretation No C. difficile detected.  Final    Comment: Performed at [facility], [address] [place], [place] [address]  Culture, blood (routine x 2)     Status: None   Collection Time: 01/03/19 12:38 PM  Result Value Ref Range Status   Specimen Description BLOOD RIGHT HAND  Final   Special Requests   Final    BOTTLES DRAWN AEROBIC AND ANAEROBIC Blood Culture results may not be optimal due to an inadequate volume of blood received in culture bottles   Culture   Final    NO GROWTH 5 DAYS Performed at [facility], [address] [place], [place] [address]    Report Status 01/08/2019 FINAL  Final  Culture, blood (routine x 2)     Status: None   Collection Time: 01/03/19 12:38 PM  Result Value Ref Range Status   Specimen Description BLOOD RIGHT ARM  Final   Special Requests   Final    BOTTLES DRAWN AEROBIC ONLY Blood Culture results may not be optimal due to an inadequate volume of blood received  in culture bottles   Culture   Final    NO GROWTH 5 DAYS Performed at [facility], [address], [place], [place] [address]    Report Status 01/08/2019 FINAL  Final     Medications:   . chlorhexidine  15 mL Mouth Rinse BID  . folic acid  1 mg Intravenous Daily  . pantoprazole (PROTONIX) IV  40 mg Intravenous Q24H  . sodium chloride flush  3 mL Intravenous Q12H  . thiamine injection  100 mg Intravenous Daily   Continuous Infusions: . dextrose 5 % and 0.45% NaCl 10 mL/hr at 01/08/19 1700      LOS: 24 days   Marinda Elk  [facility]  01/09/2019, 10:33 AM

## 2019-01-09 NOTE — Progress Notes (Signed)
Nutrition Brief Note  Chart reviewed. Pt now transitioning to comfort care.  No further nutrition interventions warranted at this time.  Please re-consult as needed.    Kate Jablonski Jesselyn Rask, MS, RD, LDN Inpatient Clinical Dietitian Pager: [phone] Weekend/After Hours: [phone]  

## 2019-01-09 NOTE — Social Work (Signed)
Pt family now requesting further care and not comfort care. [facility] referral cancelled.  CSW continuing to follow for support with disposition when medically appropriate.  Octavio Graves, MSW, [facility] Clinical Social Work [phone]

## 2019-01-09 NOTE — Plan of Care (Signed)
  Problem: Role Relationship: Goal: Family's ability to cope with current situation will improve Outcome: Progressing   Problem: Pain Management: Goal: Satisfaction with pain management regimen will improve Outcome: Progressing Note:  Pt. family is satisfied with pt. pain management.

## 2019-01-09 NOTE — Progress Notes (Signed)
Palliative:  HPI: 58 yo male with PMH diastolic CHF, COPD, bipolar disorder, polysubstance abuse admitted 12/16/18 to [facility] with pneumonia. Required intubation 1/24 and was transferred to [facility]. He has had alcohol withdrawal seizures on 1/27 and 1/30. He was extubated 2/5 but with Wernicke's encephalopathy and concern for aspiration of tube feedings. Family decided to pursue comfort measures 01/08/19 but are questioning decision for comfort care today.   I met at Jeffrey [name]'s bedside with his daughter, Jeffrey [name], and patient's brother and their significant others. Jeffrey [name] is awake (although lying sideways in bed) and even attempts to speak with me. I cannot hear his voice but I am able to make out what he is trying to say. After I introduce myself he does mouth "hi Elmo Putt." He does track and responds although does not follow commands.   I spoke more with family and Jeffrey [name] tells me that he is much better and is responding in phrases and expressions that they describe as the father she knows and loves. With these signs and hopes she is requesting aggressive care and plans to pursue PEG placement and rehab.   Late note: Family called me to share that Jeffrey [name] is speaking more with them and requesting water. They say that he took the cup of water with his hand and drank it all. They are requesting SLP follow up. They also call back to tell me that he was standing at the side of the bed with family standby assist. They feel very hopeful with these signs.   Exam: Alert, not oriented. Confused. No distress. Nonverbal but attempting to communicate. No distress. Restless.   Plan: - Daughter, Jeffrey [name], has opted to forego comfort measures and to pursue aggressive care and artificial feeding.  - SLP follow up.   35 min  Vinie Sill, NP Palliative Medicine Team Pager # [phone] (M-F 8a-5p) Team Phone # [phone] (Nights/Weekends)

## 2019-01-10 MED ORDER — ACETAMINOPHEN 650 MG RE SUPP: 650.0000 mg | Freq: Four times a day (QID) | RECTAL | Status: DC | PRN

## 2019-01-10 MED ORDER — MORPHINE SULFATE (PF) 2 MG/ML IV SOLN
2.0000 mg | INTRAVENOUS | Status: AC | PRN
  Administered 2019-01-10 – 2019-01-12 (×2): 2 mg via INTRAVENOUS
  Filled 2019-01-10 (×2): qty 1

## 2019-01-10 MED ORDER — RESOURCE THICKENUP CLEAR PO POWD
ORAL | Status: DC | PRN
  Filled 2019-01-10 (×2): qty 125

## 2019-01-10 MED ORDER — ACETAMINOPHEN 325 MG PO TABS: 650.0000 mg | ORAL_TABLET | ORAL | Status: DC | PRN

## 2019-01-10 MED ORDER — SODIUM CHLORIDE 0.9 % IV SOLN: 1.5000 g | Freq: Four times a day (QID) | INTRAVENOUS | Status: DC

## 2019-01-10 MED ORDER — ACETAMINOPHEN 325 MG PO TABS
650.0000 mg | ORAL_TABLET | Freq: Four times a day (QID) | ORAL | Status: DC | PRN
  Administered 2019-01-10 (×2): 650 mg
  Filled 2019-01-10 (×2): qty 2

## 2019-01-10 MED ORDER — POTASSIUM CHLORIDE 10 MEQ/100ML IV SOLN
10.0000 meq | INTRAVENOUS | Status: AC
  Administered 2019-01-10 (×5): 10 meq via INTRAVENOUS
  Filled 2019-01-10 (×5): qty 100

## 2019-01-10 NOTE — Progress Notes (Signed)
Patient managed to slide off from the low bed to the floor mats.  Reoriented patient that he do not have to worry on urinating or having a BM since these will go directly to condom catheter and rectal pouch, respectively.  Daughter and boyfriend awaken with this incident and patient stated he is ok, more verbal now.  Offered OJ to drink and have him seated upright while patient taking sips of OJ.  Patient went back to sleep while daughter's boyfriend sat on recliner at bedside.  Bed alarm on.  Will monitor.

## 2019-01-10 NOTE — Progress Notes (Signed)
Physical Therapy Treatment Patient Details Name: Jeffrey [name] MRN: [id number] DOB: 05/19/1961 Today's Date: 01/10/2019    History of Present Illness Pt is a 58 year old man admitted 12/16/18 with respiratory distress due to PNA. Intubated 1/24-2/5. Pt with ETOH withdrawal seizure on 1/27. +cocaine upon admission. PMH: bipolar, COPD, alcohol abuse, HTN, [name], depression, CHF.    PT Comments    Pt is making slow progress towards his goals. Pt found sitting up in recliner with both LE off the sides, wheezing and trying to preform a productive cough. Eventually, pt was able to spit small amount of phlem and indicated that he would like to return to bed. Pt requires modAx2 for transfers and maxAx2 lateral stepping from recliner to bed and for management back into the bed. Pt HoB adjusted to greater than 30 degrees to provide increased assistance in producing productive cough. Pt daughter instructed in use of suction for clearing thick secretions from his mouth. Pt family appreciative of session. D/c plans remain appropriate at this time.    Follow Up Recommendations  SNF     Equipment Recommendations  Other (comment)(TBD)    Recommendations for Other Services       Precautions / Restrictions Precautions Precautions: Fall Restrictions Weight Bearing Restrictions: No    Mobility  Bed Mobility Overal bed mobility: Needs Assistance Bed Mobility: Sit to Supine       Sit to supine: +2 for physical assistance;Max assist   General bed mobility comments: maxAx2 for trunk to bed surface and LE management into bed   Transfers Overall transfer level: Needs assistance Equipment used: 2 person hand held assist   Sit to Stand: Mod assist         General transfer comment: modA for power up and steadying with 2 person HHA, increased effort to gain balanced position   Ambulation/Gait Ambulation/Gait assistance: +2 physical assistance;Max assist Gait Distance (Feet): 3 Feet Assistive  device: 2 person hand held assist Gait Pattern/deviations: Step-to pattern;Decreased step length - right;Decreased step length - left;Shuffle Gait velocity: slowed Gait velocity interpretation: <1.31 ft/sec, indicative of household ambulator General Gait Details: maxAx2 for shuffling steps to bed, required max verbal cues and assist for weightshift to facilitate stepping with opposite foot     Balance   Sitting-balance support: Bilateral upper extremity supported;No upper extremity supported Sitting balance-Leahy Scale: Poor Sitting balance - Comments: required hands on to maintain balance EoB while trying to clear phlem from his throat   Standing balance support: Bilateral upper extremity supported Standing balance-Leahy Scale: Zero                              Cognition Arousal/Alertness: Awake/alert Behavior During Therapy: Flat affect;Restless Overall Cognitive Status: Difficult to assess                                           General Comments General comments (skin integrity, edema, etc.): Pt daughter present during session and very encouraging of pt      Pertinent Vitals/Pain Pain Assessment: Faces Faces Pain Scale: Hurts a little bit Pain Location: having difficulty coughing and clearing phlem Pain Descriptors / Indicators: Grimacing     PT Goals (current goals can now be found in the care plan section) Acute Rehab PT Goals PT Goal Formulation: Patient unable to participate in goal setting  Time For Goal Achievement: 01/13/19 Potential to Achieve Goals: Fair Progress towards PT goals: Progressing toward goals    Frequency    Min 2X/week      PT Plan Current plan remains appropriate    Co-evaluation              AM-PAC PT "6 Clicks" Mobility   Outcome Measure  Help needed turning from your back to your side while in a flat bed without using bedrails?: A Lot Help needed moving from lying on your back to sitting on the  side of a flat bed without using bedrails?: A Lot Help needed moving to and from a bed to a chair (including a wheelchair)?: A Lot Help needed standing up from a chair using your arms (e.g., wheelchair or bedside chair)?: A Lot Help needed to walk in hospital room?: Total Help needed climbing 3-5 steps with a railing? : Total 6 Click Score: 10    End of Session Equipment Utilized During Treatment: Gait belt Activity Tolerance: Patient limited by fatigue Patient left: in chair;with call bell/phone within reach;with chair alarm set;with family/visitor present Nurse Communication: Mobility status PT Visit Diagnosis: Other abnormalities of gait and mobility (R26.89);Muscle weakness (generalized) (M62.81)     Time: 6045-4098 PT Time Calculation (min) (ACUTE ONLY): 14 min  Charges:  $Gait Training: 8-22 mins                     Margalit Leece B. Beverely Risen PT, DPT Acute Rehabilitation Services Pager [phone] Office [phone]    Elon Alas Fleet 01/10/2019, 3:21 PM

## 2019-01-10 NOTE — Care Management Important Message (Signed)
Important Message  Patient Details  Name: Jeffrey [name] MRN: [id number] Date of Birth: August 26, 1961   Medicare Important Message Given:  Yes    [name] P [name] 01/10/2019, 1:12 PM

## 2019-01-10 NOTE — Evaluation (Signed)
Clinical/Bedside Swallow Evaluation Patient Details  Name: Jeffrey [name] MRN: [id number] Date of Birth: 05-01-1961  Today's Date: 01/10/2019 Time: SLP Start Time (ACUTE ONLY): 1200 SLP Stop Time (ACUTE ONLY): 1230 SLP Time Calculation (min) (ACUTE ONLY): 30 min  Past Medical History:  Past Medical History:  Diagnosis Date  . Acute blood loss anemia 02/07/2017  . Anxiety   . Arthritis    deg disease, bulging disk,  shoulder level  . Bipolar disorder (HCC)   . Bipolar disorder (HCC)   . 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 Depression    anxiety  . Hyperlipidemia   . Hypertension   . Peptic ulcer disease    Review  . Pneumonia   . [name] (peripheral vascular disease) (HCC) 06/18/2015  . Shortness of breath    Past Surgical History:  Past Surgical History:  Procedure Laterality Date  . BIOPSY  02/09/2017   Procedure: BIOPSY;  Surgeon: [name], MD;  [facility];  Service: Endoscopy;;  duodenal gastric  . COLONOSCOPY  03/14/2007   JOA:CZYSAY colonoscopy and terminal ileoscopy except external hemorrhoids  . COLONOSCOPY N/A 05/05/2013   [name]: external/internal anal canal hemorrhoids, unable to intubate TI, segemental biopsies unremarkable   . COLONOSCOPY N/A 04/11/2017   Procedure: COLONOSCOPY;  Surgeon: [name], MD;  [facility]: [facility];  Service: Endoscopy;  Laterality: N/A;  . COLONOSCOPY WITH PROPOFOL N/A 03/31/2017   Procedure: COLONOSCOPY WITH PROPOFOL;  Surgeon: [name], MD;  [facility];  Service: Endoscopy;  Laterality: N/A;  1:45pm  . ESOPHAGOGASTRODUODENOSCOPY  03/14/2007   TKZ:SWFUXNATFT antral gastritis with bulbar duodenitis/paucity to postbulbar duodenal folds and biopsy were benign with no evidence of villous atrophy.  . ESOPHAGOGASTRODUODENOSCOPY (EGD) WITH PROPOFOL N/A 02/09/2017   Procedure: ESOPHAGOGASTRODUODENOSCOPY (EGD) WITH PROPOFOL;  Surgeon: [name], MD;  [facility];  Service: Endoscopy;   Laterality: N/A;  . GIVENS CAPSULE STUDY N/A 04/08/2017   Procedure: GIVENS CAPSULE STUDY;  Surgeon: [name], MD;  [facility];  Service: Endoscopy;  Laterality: N/A;  . HAND SURGERY     left, secondary to self-inflicted laceration  . HEMORRHOID SURGERY N/A 04/14/2017   Procedure: HEMORRHOIDECTOMY;  Surgeon: [name], MD;  Location: [facility];  Service: General;  Laterality: N/A;  . SHOULDER SURGERY     right  . TOE SURGERY     left great toe , amputated-lawnmover accident   HPI:  Pt is a 58 y.o. male with PMH significant for alcohol and polysubstance abuse, COPD, bipolar disorder, and chronic diastolic CHF, presented to ED with poor responsiveness, altered mental status and combativness. He reports to have been out drinking with friends. Pt has had recurring likely alcohol withdrawal seizures during admission. EEG on 1/31 revealed abnormal EEG secondary to general background slowing; finding may be seen with a diffuse disturbance that is etiologically nonspecific, but may include a metabolic encephalopathy or medication effect, among other possibilities. No epileptiform activity was noted. Intubated 1/24- 2/5. CXR 2/8 concerning for multilobar bronchopneumonia. Head CT 2/8 no acute findings. Cortrak now in place. Chest x-ray of 01/07/19 showed patchy opacity of the left lung base superimposed; pneumonia was not excluded.   Assessment / Plan / Recommendation Clinical Impression  SLP was re-consulted for bedside swallow evaluation after the pt's family decided to not move forward with hospice care. Pt's family reported that he is notably improved and has been eating puree and self-feeding with thin liquids via cup.  Pt did require encouragement from his daughter, [name], to fully participate in the evaluation but he accepted thin liquids, ice chips, puree, and nectar thick liquids. He tolerated puree, ice chips, and nectar thick liquids without overt s/sx of aspiration but  exhibited throat clearing and coughing with thin liquids, suggesting possible aspiration. It also noteworthy, that he inconsistently demonstrated an oral hold and an oral transit delay is suspected. It is recommended that a puree diet and nectar thick liquids be initiated at this time with observance of swallowing precautions. Ice chips may be provided following oral care. SLP will perform insturmental assessment to further assess swallow fuction and determine the least restrictive diet.  SLP Visit Diagnosis: Dysphagia, oropharyngeal phase (R13.12)    Aspiration Risk       Diet Recommendation Dysphagia 1 (Puree);Nectar-thick liquid   Liquid Administration via: Cup;Straw Medication Administration: Via alternative means Supervision: Full supervision/cueing for compensatory strategies Compensations: Slow rate;Small sips/bites;Minimize environmental distractions Postural Changes: Seated upright at 90 degrees;Remain upright for at least 30 minutes after po intake    Other  Recommendations Oral Care Recommendations: Oral care BID   Follow up Recommendations Skilled Nursing facility      Frequency and Duration min 2x/week  2 weeks       Prognosis Prognosis for Safe Diet Advancement: Fair Barriers to Reach Goals: Cognitive deficits      Swallow Study   General Date of Onset: 12/30/18 HPI: Pt is a 58 y.o. male with PMH significant for alcohol and polysubstance abuse, COPD, bipolar disorder, and chronic diastolic CHF, presented to ED with poor responsiveness, altered mental status and combativness. He reports to have been out drinking with friends. Pt has had recurring likely alcohol withdrawal seizures during admission. EEG on 1/31 revealed abnormal EEG secondary to general background slowing; finding may be seen with a diffuse disturbance that is etiologically nonspecific, but may include a metabolic encephalopathy or medication effect, among other possibilities. No epileptiform activity was  noted. Intubated 1/24- 2/5. CXR 2/8 concerning for multilobar bronchopneumonia. Head CT 2/8 no acute findings. Cortrak now in place. Chest x-ray of 01/07/19 showed patchy opacity of the left lung base superimposed; pneumonia was not excluded. Type of Study: Bedside Swallow Evaluation Previous Swallow Assessment: Multiple swallow assessments at bedside  Diet Prior to this Study: Dysphagia 1 (puree);Thin liquids Temperature Spikes Noted: No Respiratory Status: Room air History of Recent Intubation: Yes Length of Intubations (days): 13 days Date extubated: 12/28/18 Behavior/Cognition: Alert;Confused;Distractible;Requires cueing;Doesn't follow directions(Requires mod-max cues for directions) Oral Cavity Assessment: Within Functional Limits Oral Care Completed by SLP: No Oral Cavity - Dentition: Edentulous Self-Feeding Abilities: Total assist Patient Positioning: Upright in chair;Postural control adequate for testing Baseline Vocal Quality: Low vocal intensity;Breathy Volitional Cough: Weak Volitional Swallow: Unable to elicit    Oral/Motor/Sensory Function Overall Oral Motor/Sensory Function: Other (comment)(Pt was unable to follow the necessary commands )   Ice Chips Ice chips: Within functional limits Presentation: Spoon   Thin Liquid Thin Liquid: Impaired Presentation: Spoon;Straw Pharyngeal  Phase Impairments: Cough - Immediate;Throat Clearing - Immediate    Nectar Thick Nectar Thick Liquid: Within functional limits Presentation: Cup;Spoon;Straw   Honey Thick Honey Thick Liquid: Not tested   Puree Puree: Impaired Presentation: Spoon Oral Phase Functional Implications: Prolonged oral transit;Oral holding Pharyngeal Phase Impairments: Multiple swallows   Solid    Zeki Bedrosian I. Vear Clock, MS, CCC-SLP Acute Rehabilitation Services Office number [phone] Pager [phone]  Solid: Not tested      [name] 01/10/2019,1:33 PM

## 2019-01-10 NOTE — Progress Notes (Signed)
[facility] PROGRESS NOTE    Progress Note  Jeffrey [name]  EZM:[id number] DOB: 1961/07/18 DOA: 12/16/2018 PCP: Gareth Morgan, MD     Brief Narrative:   Jeffrey [name] is an 58 y.o. 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 MRI brain on 12/22/2018 and was unrevealing. Continue thiamine, folate and Depakote. The daughter wants to revert everything.  After having a long conversation with her as she wants full aggressive treatment. We will have a swallowing evaluation again today. The patient is significantly more awake and wants to eat.  Aspiration pneumonia: X-ray done showed left lower lobe infiltrate. Has remained afebrile has no leukocytosis repeat a CBC in the morning continue to monitor fever curve we will hold on on antibiotics.  Alcohol use: Continue Ativan protocol. No signs of withdrawal.  Diarrhea: C. difficile negative.  COPD, severe (HCC) Well compensated.  Bipolar disorder with severe depression (HCC) Continue Depacon.  Chronic diastolic CHF (congestive heart failure) (HCC) Appears to be euvolemic.  Acute respiratory failure with hypoxemia (HCC) Due to pneumonia status post extubation. Saturations have remained stable.  Hypernatremia: Now resolved KVO IV fluids.  Hypokalemia: Potassium IV recheck in the morning.  New acute kidney injury: Prerenal azotemia resolved with IV fluid hydration. Creatinine at baseline.  Nutrition: Reconsult nutritionist, start free water  flushes.  Thrombocytosis: Likely reactive: Recheck a CBC in the morning, continue to monitor fever curve.    DVT prophylaxis: lovenox Family Communication:none Disposition Plan/Barrier to D/C: unable to determine Code Status:     Code Status Orders  (From admission, onward)         Start     Ordered   12/16/18 1213  Full code  Continuous     12/16/18 1216        Code Status History    Date Active Date Inactive Code Status Order ID Comments User Context   12/16/2018 0429 12/16/2018 1216 Full Code [id number]  Briscoe Deutscher, MD ED   09/16/2018 1536 09/17/2018 1342 Full Code [id number]  Maurilio Lovely D, DO ED   06/16/2018 0750 06/17/2018 1356 DNR [id number]  Meredeth Ide, MD Inpatient   01/31/2018 2049 02/08/2018 2202 Full Code [id number]  Kerry Hough, PA-C Inpatient   01/31/2018 0533 01/31/2018 1850 Full Code [id number]  Zadie Rhine, MD ED   04/07/2017 2311 04/15/2017 1357 Full Code [id number]  Alberteen Sam, MD Inpatient   02/07/2017 2313 02/10/2017 1645 Full Code [id number]  Haydee Monica, MD Inpatient        IV Access:    Peripheral IV   Procedures and diagnostic studies:   Ct Abdomen Wo Contrast  Result Date: 01/09/2019 CLINICAL DATA:  58 year old male with need for gastrostomy tube EXAM: CT ABDOMEN WITHOUT CONTRAST TECHNIQUE: Multidetector CT imaging of the abdomen was performed following the standard protocol without IV contrast. COMPARISON:  Chest CT 01/04/2018 FINDINGS: Lower chest: Mixed ground-glass and nodular opacity of the left greater than right lung bases in a predominantly peribronchovascular distribution. Small bilateral pleural effusions. Hepatobiliary: Unremarkable liver.  Unremarkable gallbladder Pancreas: Unremarkable pancreas Spleen: Unremarkable spleen Adrenals/Urinary Tract: Unremarkable appearance of the adrenal glands. Unremarkable appearance  of the kidneys. Stomach/Bowel: Weighted tip enteric feeding tube terminates in the stomach.  Unremarkable appearance of the visualized small bowel and colon. Vascular/Lymphatic: Atherosclerotic calcification of the aorta. Other: None Musculoskeletal: No acute displaced fracture. Mild degenerative changes. IMPRESSION: No acute CT finding of the abdomen. Weighted tip enteric feeding tube terminates in the stomach. Aortic atherosclerosis.  Aortic Atherosclerosis (ICD10-I70.0). Electronically Signed   By: Gilmer Mor D.O.   On: 01/09/2019 19:32     Medical Consultants:    None.  Anti-Infectives:   None  Subjective:    Jeffrey [name] is awake in bed sitting up in bed relating that he is hungry and he would like to eat.  Objective:    Vitals:   01/09/19 2014 01/10/19 0454 01/10/19 0609 01/10/19 0635  BP: 129/75  (!) 151/78 (!) 154/80  Pulse: (!) 108  (!) 107 (!) 105  Resp:   16 18  Temp: 97.7 F (36.5 C)  97.8 F (36.6 C) 98.1 F (36.7 C)  TempSrc: Oral  Oral Oral  SpO2: 97%  96% 94%  Weight:  63.5 kg    Height:        Intake/Output Summary (Last 24 hours) at 01/10/2019 0944 Last data filed at 01/10/2019 0105 Gross per 24 hour  Intake 90 ml  Output 200 ml  Net -110 ml   Filed Weights   01/08/19 0500 01/09/19 0500 01/10/19 0454  Weight: 67.6 kg 67.6 kg 63.5 kg    Exam: General exam: In no acute distress. Respiratory system: Good air movement and clear to auscultation. Cardiovascular system: S1 & S2 heard, RRR. Marland Kitchen  Gastrointestinal system: Abdomen is nondistended, soft and nontender.  Central nervous system: he is awake he recognizes me seems nonfocal moving all 4 extremities without any difficulties. Extremities: No pedal edema. Skin: No rashes, lesions or ulcers   Data Reviewed:    Labs: Basic Metabolic Panel: Recent Labs  Lab 01/04/19 1012 01/05/19 0632 01/05/19 1422 01/06/19 0601 01/06/19 1934 01/07/19 0653 01/08/19 0839  NA  --  152* 151* 149*  --  147* 143  K  --  4.6 4.2 3.7  --  3.7 3.0*  CL  --  116* 118* 118*  --  118* 107  CO2   --  24 29 22   --  23 25  GLUCOSE  --  100* 110* 83  --  83 91  BUN  --  7 6 5*  --  5* <5*  CREATININE  --  1.75* 0.77 0.63  --  0.77 0.55*  CALCIUM  --  8.5* 8.5* 8.3*  --  8.0* 8.1*  MG 1.9  --   --   --   --   --   --   PHOS  --   --   --   --  3.7 3.7  --    GFR Estimated Creatinine Clearance: 90.4 mL/min (A) (by C-G formula based on SCr of 0.55 mg/dL (L)). Liver Function Tests: No results for input(s): AST, ALT, ALKPHOS, BILITOT, PROT, ALBUMIN in the last 168 hours. No results for input(s): LIPASE, AMYLASE in the last 168 hours. Recent Labs  Lab 01/05/19 0632  AMMONIA 25   Coagulation profile No results for input(s): INR, PROTIME in the last 168 hours.  CBC: Recent Labs  Lab 01/04/19 0551 01/05/19 0632 01/08/19 0839  WBC 4.3 4.8 14.8*  NEUTROABS 2.8 3.2 13.3*  HGB 11.1* 10.7* 11.3*  HCT 35.1* 34.6* 35.3*  MCV 92.1 92.3 88.9  PLT  359 391 413*   Cardiac Enzymes: No results for input(s): CKTOTAL, CKMB, CKMBINDEX, TROPONINI in the last 168 hours. BNP (last 3 results) No results for input(s): PROBNP in the last 8760 hours. CBG: Recent Labs  Lab 01/07/19 1953 01/07/19 2304 01/08/19 0402 01/08/19 0738 01/08/19 0836  GLUCAP 76 81 74 86 88   D-Dimer: No results for input(s): DDIMER in the last 72 hours. Hgb A1c: No results for input(s): HGBA1C in the last 72 hours. Lipid Profile: No results for input(s): CHOL, HDL, LDLCALC, TRIG, CHOLHDL, LDLDIRECT in the last 72 hours. Thyroid function studies: No results for input(s): TSH, T4TOTAL, T3FREE, THYROIDAB in the last 72 hours.  Invalid input(s): FREET3 Anemia work up: No results for input(s): VITAMINB12, FOLATE, FERRITIN, TIBC, IRON, RETICCTPCT in the last 72 hours. Sepsis Labs: Recent Labs  Lab 01/04/19 0551 01/05/19 0632 01/08/19 0839  WBC 4.3 4.8 14.8*   Microbiology Recent Results (from the past 240 hour(s))  Culture, Urine     Status: Abnormal   Collection Time: 12/31/18 11:26 PM  Result Value Ref  Range Status   Specimen Description URINE, RANDOM  Final   Special Requests   Final    NONE Performed at [facility], [address] [place], [place] [address]    Culture STAPHYLOCOCCUS EPIDERMIDIS (A)  Final   Report Status 01/03/2019 FINAL  Final   Organism ID, Bacteria STAPHYLOCOCCUS EPIDERMIDIS  Final      Susceptibility   Staphylococcus epidermidis - MIC*    CIPROFLOXACIN <=0.5 SENSITIVE Sensitive     GENTAMICIN <=0.5 SENSITIVE Sensitive     NITROFURANTOIN 64 INTERMEDIATE Intermediate     OXACILLIN >=4 RESISTANT Resistant     TETRACYCLINE <=1 SENSITIVE Sensitive     VANCOMYCIN <=0.5 SENSITIVE Sensitive     TRIMETH/SULFA <=10 SENSITIVE Sensitive     CLINDAMYCIN >=8 RESISTANT Resistant     RIFAMPIN 16 RESISTANT Resistant     Inducible Clindamycin NEGATIVE Sensitive     * STAPHYLOCOCCUS EPIDERMIDIS  Culture, blood (routine x 2)     Status: None   Collection Time: 01/01/19 12:33 AM  Result Value Ref Range Status   Specimen Description BLOOD LEFT ANTECUBITAL  Final   Special Requests   Final    BOTTLES DRAWN AEROBIC AND ANAEROBIC Blood Culture results may not be optimal due to an inadequate volume of blood received in culture bottles   Culture   Final    NO GROWTH 5 DAYS Performed at [facility], [address], [place], [place] [address]    Report Status 01/06/2019 FINAL  Final  Culture, blood (routine x 2)     Status: Abnormal   Collection Time: 01/01/19 12:33 AM  Result Value Ref Range Status   Specimen Description BLOOD LEFT HAND  Final   Special Requests   Final    BOTTLES DRAWN AEROBIC AND ANAEROBIC Blood Culture adequate volume   Culture  Setup Time   Final    GRAM POSITIVE COCCI AEROBIC BOTTLE ONLY CRITICAL RESULT CALLED TO, READ BACK BY AND VERIFIED WITH: PHARMD [facility] [id number] FCP    Culture (A)  Final    STAPHYLOCOCCUS SPECIES (COAGULASE NEGATIVE) THE SIGNIFICANCE OF ISOLATING THIS ORGANISM FROM A SINGLE SET OF BLOOD CULTURES WHEN MULTIPLE SETS  ARE DRAWN IS UNCERTAIN. PLEASE NOTIFY THE MICROBIOLOGY DEPARTMENT WITHIN ONE WEEK IF SPECIATION AND SENSITIVITIES ARE REQUIRED. Performed at [facility], [address], [place], [place] [address]    Report Status 01/03/2019 FINAL  Final  Blood Culture ID Panel (Reflexed)     Status: Abnormal   Collection Time: 01/01/19 12:33 AM  Result Value Ref Range Status   Enterococcus species NOT DETECTED NOT DETECTED Final   Listeria monocytogenes NOT DETECTED NOT DETECTED Final   Staphylococcus species DETECTED (A) NOT DETECTED Final    Comment: Methicillin (oxacillin) resistant coagulase negative staphylococcus. Possible blood culture contaminant (unless isolated from more than one blood culture draw or clinical case suggests pathogenicity). No antibiotic treatment is indicated for blood  culture contaminants. CRITICAL RESULT CALLED TO, READ BACK BY AND VERIFIED WITH: PHARMD [facility] [id number] FCP    Staphylococcus aureus (BCID) NOT DETECTED NOT DETECTED Final   Methicillin resistance DETECTED (A) NOT DETECTED Final    Comment: CRITICAL RESULT CALLED TO, READ BACK BY AND VERIFIED WITH: PHARMD [facility] [id number] FCP    Streptococcus species NOT DETECTED NOT DETECTED Final   Streptococcus agalactiae NOT DETECTED NOT DETECTED Final   Streptococcus pneumoniae NOT DETECTED NOT DETECTED Final   Streptococcus pyogenes NOT DETECTED NOT DETECTED Final   Acinetobacter baumannii NOT DETECTED NOT DETECTED Final   Enterobacteriaceae species NOT DETECTED NOT DETECTED Final   Enterobacter cloacae complex NOT DETECTED NOT DETECTED Final   Escherichia coli NOT DETECTED NOT DETECTED Final   Klebsiella oxytoca NOT DETECTED NOT DETECTED Final   Klebsiella pneumoniae NOT DETECTED NOT DETECTED Final   Proteus species NOT DETECTED NOT DETECTED Final   Serratia marcescens NOT DETECTED NOT DETECTED Final   Haemophilus influenzae NOT DETECTED NOT DETECTED Final   Neisseria meningitidis NOT DETECTED NOT DETECTED  Final   Pseudomonas aeruginosa NOT DETECTED NOT DETECTED Final   Candida albicans NOT DETECTED NOT DETECTED Final   Candida glabrata NOT DETECTED NOT DETECTED Final   Candida krusei NOT DETECTED NOT DETECTED Final   Candida parapsilosis NOT DETECTED NOT DETECTED Final   Candida tropicalis NOT DETECTED NOT DETECTED Final  C difficile quick scan w PCR reflex     Status: None   Collection Time: 01/01/19 10:23 AM  Result Value Ref Range Status   C Diff antigen NEGATIVE NEGATIVE Final   C Diff toxin NEGATIVE NEGATIVE Final   C Diff interpretation No C. difficile detected.  Final    Comment: Performed at [facility], [address] [place], [place] [address]  Culture, blood (routine x 2)     Status: None   Collection Time: 01/03/19 12:38 PM  Result Value Ref Range Status   Specimen Description BLOOD RIGHT HAND  Final   Special Requests   Final    BOTTLES DRAWN AEROBIC AND ANAEROBIC Blood Culture results may not be optimal due to an inadequate volume of blood received in culture bottles   Culture   Final    NO GROWTH 5 DAYS Performed at [facility], [address] [place], [place] [address]    Report Status 01/08/2019 FINAL  Final  Culture, blood (routine x 2)     Status: None   Collection Time: 01/03/19 12:38 PM  Result Value Ref Range Status   Specimen Description BLOOD RIGHT ARM  Final   Special Requests   Final    BOTTLES DRAWN AEROBIC ONLY Blood Culture results may not be optimal due to an inadequate volume of blood received in culture bottles   Culture   Final    NO GROWTH 5 DAYS Performed at [facility], [address] [place], [place] [address]    Report Status 01/08/2019 FINAL  Final     Medications:   . chlorhexidine  15 mL Mouth Rinse BID  . folic acid  1 mg Intravenous Daily  . free water  200 mL Per Tube Q8H  . pantoprazole sodium  40 mg Per Tube Daily  . thiamine injection  100 mg Intravenous Daily  . valproic acid  750 mg Oral BID    Continuous Infusions: . dextrose 5 % and 0.45% NaCl 10 mL/hr at 01/08/19 1700      LOS: 25 days   Marinda Elk  [facility]  01/10/2019, 9:44 AM

## 2019-01-10 NOTE — Progress Notes (Signed)
Patient still wants to get OOB despite daughter staying with him on bed.  Daughter, boyfriend and this RN decided to have patient sit on wheelchair and wheeled him around hall until patient tired, patient seemed enjoying while wheeled around the hall.

## 2019-01-10 NOTE — Progress Notes (Addendum)
Patient's family called out passing Consulting civil engineer that patient was on the floor beside floor mat, this RN who was helping L. Abiera & NT transferred a patient to an air mattress, was then called by Charge RN informing the incident and helped patient up to bed.  Rectal pouch and condom catheter were dislodged at this time.  Patient now lying on bed with condom catheter back without the rectal pouch with both eyes closed.  No reported injury on the patient. Patient will greatly benefit for a physical 1:1 sitter. A family member is at the bedside.  Text paged attending MD of incident and physical safety sitter recommendation.

## 2019-01-10 NOTE — Progress Notes (Signed)
Patient now back to his room and bed, seemed relaxed and thankful of what we did to him, being able to see the outside of the room.  Bed alarm on, reoriented not to try OOB.  Daughter and boyfriend at bedside.  Will monitor.

## 2019-01-10 NOTE — Progress Notes (Signed)
Nutrition Follow-up  DOCUMENTATION CODES:   Not applicable  INTERVENTION:  Monitor for SLP recs, should tube feed resume recommend  Osmolite 1.5 formula at 30 ml/hr and increasing by 10 ml every 4 hours to goal rate of 50 ml/hr.  Recommend 30 ml Prostat daily per tube.   Tube feeding regimen to provide 1900 kcal (100% of needs), 90 grams of protein, and 912 ml of  water.    NUTRITION DIAGNOSIS:   Inadequate oral intake related to inability to eat as evidenced by NPO status. ongoing   GOAL:   Patient will meet greater than or equal to 90% of their needs  progressing  MONITOR:   PO intake, TF tolerance, Labs, Skin, Weight trends, I & O's  REASON FOR ASSESSMENT:   Consult Enteral/tube feeding initiation and management  ASSESSMENT:   58 yo male with PMH of [name], bipolar d/o, alcohol & marajuana abuse, [name], COPD, HTN who was admitted AMS, elevated ammonia level requiring intubation on admission.  1/24 - [facility] admission w/ pneumonia 1/24 - intubated and transferred to [facility] 2/5 - extubated w/ Wernicke's encephalopathy 2/14 - Cortrack placed  2/16 - Comfort Care 2/17 - d/c Comfort Care; pursue aggressive measures 2/17 - IR PEG placement  Patient sitting in chair with family in room at time of visit. Cortrak remains in place. Daughter informed RD that pt is no longer receiving PEG placement, pt eating applesauce and taking sips of water w/out complication. Family waiting on SLP evaluation.  Will continue to monitor for SLP recommendations.   Medications reviewed and include: Folic acid, Protonix, Thiamine  Free H20 - every 8 hrs  NUTRITION - FOCUSED PHYSICAL EXAM:    Most Recent Value  Orbital Region  No depletion  Upper Arm Region  No depletion  Thoracic and Lumbar Region  No depletion  Buccal Region  No depletion  Temple Region  No depletion  Clavicle Bone Region  No depletion  Clavicle and Acromion Bone Region  No depletion  Scapular Bone Region  Unable  to assess  Dorsal Hand  No depletion  Patellar Region  No depletion  Anterior Thigh Region  No depletion  Posterior Calf Region  No depletion  Edema (RD Assessment)  Mild  Hair  Reviewed  Eyes  Unable to assess  Mouth  Unable to assess  Skin  Reviewed  Nails  Reviewed       Diet Order:   Diet Order            DIET - DYS 1 Room service appropriate? Yes; Fluid consistency: Thin  Diet effective now              EDUCATION NEEDS:   Not appropriate for education at this time  Skin:  Skin Assessment: Reviewed RN Assessment  Last BM:  2/17; type 7 (brown; large)  Height:   Ht Readings from Last 1 Encounters:  01/03/19 5\' 6"  (1.676 m)    Weight:   Wt Readings from Last 1 Encounters:  01/10/19 63.5 kg    Ideal Body Weight:  64.5 kg  BMI:  Body mass index is 22.6 kg/m.  Estimated Nutritional Needs:   Kcal:  3437-3578  Protein:  83-95 grams  Fluid:  > 2L/day    Lars Masson, RD, LDN  After Hours/Weekend Pager: [phone]

## 2019-01-10 NOTE — Progress Notes (Signed)
Palliative:  I met again today at Jeffrey [name]'s bedside with daughter, Jeffrey [name], and other family members. Jeffrey [name] is awake, alert, and communicative although he continues to be restless and high risk for falls. I spoke with family about f/u SLP consult and they are hopeful to avoid PEG placement. They are feeding him during our visit and he does have increased coughing and respirations after thin liquids. I explained the likelihood of thickened liquids as well as MBS. Jeffrey [name] tells me that they have discussed as a family and that they would like to reverse DNR and proceed with full code at this time. I reiterated that if he continues with aspiration risk then they would want reintubation and to begin this process all over and they agree they want this. I have changed him to FULL code. I also explained that he should remain NPO with no further intake until SLP can see and make recommendations. All questions/concerns addressed. Emotional support provided.   Exam: Alert, confused but appropriate responses. Lungs rhonchi.   Plan: - FULL code - SLP and PT/OT follow up - High fall risk as he is restless and gets out of bed but very unsteady  25 min  Vinie Sill, NP Palliative Medicine Team Pager # [phone] (M-F 8a-5p) Team Phone # [phone] (Nights/Weekends)

## 2019-01-11 ENCOUNTER — Inpatient Hospital Stay (HOSPITAL_COMMUNITY): Payer: Medicare Other

## 2019-01-11 LAB — BASIC METABOLIC PANEL
Anion gap: 8 (ref 5–15)
BUN: 7 mg/dL (ref 6–20)
CO2: 29 mmol/L (ref 22–32)
Calcium: 8 mg/dL — ABNORMAL LOW (ref 8.9–10.3)
Chloride: 101 mmol/L (ref 98–111)
Creatinine, Ser: 0.56 mg/dL — ABNORMAL LOW (ref 0.61–1.24)
GFR calc Af Amer: >60 mL/min (ref >60)
GFR calc non Af Amer: >60 mL/min (ref >60)
Glucose, Bld: 80 mg/dL (ref 70–99)
Potassium: 3.2 mmol/L — ABNORMAL LOW (ref 3.5–5.1)
Sodium: 138 mmol/L (ref 135–145)

## 2019-01-11 LAB — CBC
HCT: 36.1 % — ABNORMAL LOW (ref 39.0–52.0)
Hemoglobin: 11.5 g/dL — ABNORMAL LOW (ref 13.0–17.0)
MCH: 27.8 pg (ref 26.0–34.0)
MCHC: 31.9 g/dL (ref 30.0–36.0)
MCV: 87.4 fL (ref 80.0–100.0)
Platelets: 472 10*3/uL — ABNORMAL HIGH (ref 150–400)
RBC: 4.13 MIL/uL — ABNORMAL LOW (ref 4.22–5.81)
RDW: 12.9 % (ref 11.5–15.5)
WBC: 8.8 10*3/uL (ref 4.0–10.5)
nRBC: 0.2 % (ref 0.0–0.2)

## 2019-01-11 MED ORDER — POTASSIUM CHLORIDE 20 MEQ/15ML (10%) PO SOLN
40.0000 meq | Freq: Once | ORAL | Status: DC
  Filled 2019-01-11: qty 30

## 2019-01-11 MED ORDER — POTASSIUM CHLORIDE 20 MEQ PO PACK
40.0000 meq | PACK | Freq: Once | ORAL | Status: AC
  Administered 2019-01-11: 40 meq via ORAL
  Filled 2019-01-11: qty 2
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
## 2019-01-11 NOTE — Progress Notes (Signed)
Palliative:  I met again today with Jeffrey [name] and daughter, Jeffrey [name], and family at bedside. Jeffrey [name] is alert and he tracks and follows simple commands. He answers questions and interacts with appropriate responses. He continues to be restless and confused but overall has continued with improvement over the past few days. He has had swallow study this morning and will continue with dys I, nectar thick liquids. He has good appetite. Will proceed with d/c Cortrak. Jeffrey [name] thanks me for my help and smiles and waves. He is joking with his family at bedside. Family is incredibly happy with his improvement and very hopeful for continued improvement. Encephalopathy appears to be resolving.   Exam: Alert, confused but appropriate. No distress.   Plan: - Proceed with full aggressive care with hopes of continued improvement.  25 min  Vinie Sill, NP Palliative Medicine Team Pager # [phone] (M-F 8a-5p) Team Phone # [phone] (Nights/Weekends)

## 2019-01-11 NOTE — Progress Notes (Signed)
Pt's son n law dissconnected pt's IV line today when getting him up to [facility], line left laying on bed. Son n law was educated on not doing this because the line can get contaminated and cause an infection. Line tubing was not reconnected, changed out with new tubing. This is not the first time he has done this as he stated, that he has done it before and covered it with an alcohol swab. He was instructed and educated not to do this again in fear that the pt could get an infection.  He verbalized understanding.

## 2019-01-11 NOTE — Social Work (Signed)
Pt case has gone to level [facility] PASRR review, pt also without any bed offers. Will attempt to re-refer pt to SNFs.  Octavio Graves, MSW, [facility] Clinical Social Work [phone]

## 2019-01-11 NOTE — Progress Notes (Signed)
Modified Barium Swallow Progress Note  Patient Details  Name: Jeffrey [name] MRN: [id number] Date of Birth: 1961-08-19  Today's Date: 01/11/2019  Modified Barium Swallow completed.  Full report located under Chart Review in the Imaging Section.  Brief recommendations include the following:  Clinical Impression  Pt presents with moderate-severe oropharyngeal dysphagia. The oral phase of the swallow was significant for bolus holding and prolonged mastication. Pharyngeally, he presented with a pharyngeal delay which resulted in penetration of thin liquids before, and during deglutition. Additionally, he demonstrated trace vallecular, and pyriform sinus residue secondary to reduced lingual retraction, and reduced laryngeal excursion, respectively. Penetration was to the level of the vocal folds and coughing was inconsistently effective in expelling the penetrant. It is recommended that a dysphagia 1 diet with nectar thick liquids be continued at this time and SLP will follow to ensure tolerance of the recommended diet. Dysphagia inervention is also clinically indicated following discharge.    Swallow Evaluation Recommendations       SLP Diet Recommendations: Dysphagia 1 (Puree) solids;Nectar thick liquid   Liquid Administration via: Cup;Straw   Medication Administration: Crushed with puree   Supervision: Full supervision/cueing for compensatory strategies   Compensations: Slow rate;Small sips/bites;Minimize environmental distractions;Multiple dry swallows after each bite/sip   Postural Changes: Remain semi-upright after after feeds/meals (Comment);Seated upright at 90 degrees   Oral Care Recommendations: Oral care BID      Jeffrey [name] I. Vear Clock, MS, CCC-SLP Acute Rehabilitation Services Office number [phone] Pager [phone]  Jeffrey [name] 01/11/2019,10:23 AM

## 2019-01-12 LAB — CBC
HCT: 33.5 % — ABNORMAL LOW (ref 39.0–52.0)
Hemoglobin: 10.8 g/dL — ABNORMAL LOW (ref 13.0–17.0)
MCH: 28.2 pg (ref 26.0–34.0)
MCHC: 32.2 g/dL (ref 30.0–36.0)
MCV: 87.5 fL (ref 80.0–100.0)
Platelets: 435 10*3/uL — ABNORMAL HIGH (ref 150–400)
RBC: 3.83 MIL/uL — ABNORMAL LOW (ref 4.22–5.81)
RDW: 13.1 % (ref 11.5–15.5)
WBC: 9.3 10*3/uL (ref 4.0–10.5)
nRBC: 0.2 % (ref 0.0–0.2)

## 2019-01-12 LAB — BASIC METABOLIC PANEL
Anion gap: 12 (ref 5–15)
BUN: 6 mg/dL (ref 6–20)
CO2: 24 mmol/L (ref 22–32)
Calcium: 8.1 mg/dL — ABNORMAL LOW (ref 8.9–10.3)
Chloride: 99 mmol/L (ref 98–111)
Creatinine, Ser: 0.65 mg/dL (ref 0.61–1.24)
GFR calc Af Amer: >60 mL/min (ref >60)
GFR calc non Af Amer: >60 mL/min (ref >60)
Glucose, Bld: 82 mg/dL (ref 70–99)
Potassium: 4.2 mmol/L (ref 3.5–5.1)
Sodium: 135 mmol/L (ref 135–145)

## 2019-01-12 MED ORDER — ADULT MULTIVITAMIN W/MINERALS CH: 1.0000 | ORAL_TABLET | Freq: Every day | ORAL | Status: DC

## 2019-01-12 MED ORDER — MORPHINE SULFATE (PF) 2 MG/ML IV SOLN
2.0000 mg | INTRAVENOUS | Status: DC | PRN
  Administered 2019-01-12: 2 mg via INTRAVENOUS
  Filled 2019-01-12: qty 1

## 2019-01-12 NOTE — Progress Notes (Signed)
SLP Cancellation Note  Patient Details Name: ABSALON MANNIS MRN: [id number] DOB: 01-22-1961   Cancelled treatment:       Reason Eval/Treat Not Completed: Patient at procedure or test/unavailable. Pt currently ambulating in hall with staff. SLP will follow up as able.    [name] 01/12/2019, 11:17 AM

## 2019-01-12 NOTE — Progress Notes (Signed)
Palliative:  I met again with Jeffrey [name] and daughter, [name]. Jeffrey [name] is walking with sitter. He is ambulating with 1-2 assist and leaning but able to take steps well just very unsteady. He is eating and they are hopeful for diet progression. Encephalopathy is slowly continuing to improve. He is alert, communicative and appreciative of care. He continues with underlying confusion and restlessness. Emotional support provided.   Exam: Alert. Confused. No distress.   Plan: - Continue PT/OT/SLP follow up.  - Keep blinds open and stimulation during daytime to assist with improving orientation and delirium as well as dark and quiet environment at night.   15 min  Vinie Sill, NP Palliative Medicine Team Pager # [phone] (M-F 8a-5p) Team Phone # [phone] (Nights/Weekends)

## 2019-01-12 NOTE — Progress Notes (Signed)
PROGRESS NOTE    [name]  UJW:[id number] DOB: Jan 14, 1961 DOA: 12/16/2018 PCP: Gareth Morgan, MD   Brief Narrative:  HPI on 12/16/2018 by Dr. Mickie Kay is a 58 y.o. male with medical history significant for alcohol and polysubstance abuse, COPD, bipolar disorder, and chronic diastolic CHF, now presenting to the emergency department after being found poorly responsive.  Patient is unable to contribute to the history secondary to his clinical condition.  He was last seen in his usual state 2 days ago.  He had reportedly been drinking alcohol with friends.  He was found today in bed, difficult to wake, and was brought into the ED for evaluation of this.  Patient would wake up and say his name in the ED, but was not providing any history.   Interim history Presented with respiratory failure due to Klebsiella pneumonia, was intubated 12/16/2018 and extubated 12/28/2018.  Patient completed course of antibiotics.  Also noted to have a alcohol withdrawal seizures.  Palliative care was consulted.  Currently patient's mental status is improving.  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  MRI brain 12/22/2018 unrevealing. -Continue thiamine, folate, Depakote -Patient's mental status improved and appears to be at baseline per family   Aspiration pneumonia -Chest x-ray done showed left lower lobe infiltrate.  Currently he is afebrile and leukocytosis is improved. -Was treated with antibiotics earlier this admission and completed course -Chest x-ray today may represent multifocal pneumonia -Speech therapy consulted, currently on dysphagia diet -NG tube discontinued on 01/11/2019  Alcohol/Drug abuse -Continue CIWA protocol, currently no signs of withdrawal -Tox screen Cocaine and THC positive  Diarrhea -C. difficile was negative  COPD -Currently well compensated  Bipolar disorder  with severe depression -Continue Depacon  Chronic diastolic heart failure -Currently euvolemic, compensated -Continue to monitor intake and output, daily weights  Acute respiratory failure with hypoxemia -Likely secondary to the above, with pneumonia -Patient required intubation extubated successfully -Continue to monitor -Saturations have remained stable  Hypernatremia -Resolved  Hypokalemia -resolved with replacement -continue to monitor BMP  Acute kidney injury -Creatinine stable -Continue to monitor CBC  Thrombocytosis -Likely reactive, improving, continue to monitor CBC  DVT Prophylaxis  SCDs  Code Status: Full  Family Communication: Family at bedside  Disposition Plan: Admitted. Pending SNF  Consultants PCCM Palliative care  Procedures  Intubation/extubation  Antibiotics   Anti-infectives (From admission, onward)   Start     Dose/Rate Route Frequency Ordered Stop   01/10/19 1000  ampicillin-sulbactam (UNASYN) 1.5 g in sodium chloride 0.9 % 100 mL IVPB  Status:  Discontinued     1.5 g 200 mL/hr over 30 Minutes Intravenous Every 6 hours 01/10/19 0952 01/10/19 0954   12/20/18 1330  cefTRIAXone (ROCEPHIN) 2 g in sodium chloride 0.9 % 100 mL IVPB     2 g 200 mL/hr over 30 Minutes Intravenous Every 24 hours 12/20/18 1328 12/26/18 1338   12/19/18 2000  ceFAZolin (ANCEF) IVPB 2g/100 mL premix  Status:  Discontinued     2 g 200 mL/hr over 30 Minutes Intravenous Every 8 hours 12/19/18 1434 12/20/18 1328   12/19/18 0900  ceFAZolin (ANCEF) IVPB 2g/100 mL premix  Status:  Discontinued     2 g 200 mL/hr over 30 Minutes Intravenous Every 8 hours 12/19/18 0828 12/19/18 1434   12/18/18 1015  cefTRIAXone (ROCEPHIN) 1 g in sodium chloride 0.9 % 100 mL IVPB  Status:  Discontinued  1 g 200 mL/hr over 30 Minutes Intravenous Every 24 hours 12/18/18 0940 12/19/18 0719      Subjective:   Jeffrey [name] seen and examined today.  No complaints today.  Denies current  chest pain, shortness breath, abdominal pain, nausea vomiting, diarrhea constipation, dizziness or headache. Objective:   Vitals:   01/11/19 2119 01/12/19 0500 01/12/19 0527 01/12/19 1338  BP: 136/77  133/72 129/75  Pulse: 95  (!) 101 89  Resp: 20  20 (!) 22  Temp: 97.7 F (36.5 C)  98.4 F (36.9 C) 98.1 F (36.7 C)  TempSrc: Oral  Oral Oral  SpO2: 97%  95% 96%  Weight:  64.4 kg    Height:        Intake/Output Summary (Last 24 hours) at 01/12/2019 1433 Last data filed at 01/12/2019 1401 Gross per 24 hour  Intake 120 ml  Output 1050 ml  Net -930 ml   Filed Weights   01/10/19 0454 01/11/19 0500 01/12/19 0500  Weight: 63.5 kg 63.6 kg 64.4 kg   Exam  General: Well developed, chronically ill-appearing, NAD  HEENT: NCAT, mucous membranes moist.   Neck: Supple  Cardiovascular: S1 S2 auscultated, RRR, no murmur  Respiratory: Clear to auscultation bilaterally with equal chest rise  Abdomen: Soft, nontender, nondistended, + bowel sounds  Extremities: warm dry without cyanosis clubbing or edema  Neuro: AAOx3, nonfocal  Psych: Pleasant, appropriate mood and affect  Data Reviewed: I have personally reviewed following labs and imaging studies  CBC: Recent Labs  Lab 01/08/19 0839 01/11/19 0154 01/12/19 0244  WBC 14.8* 8.8 9.3  NEUTROABS 13.3*  --   --   HGB 11.3* 11.5* 10.8*  HCT 35.3* 36.1* 33.5*  MCV 88.9 87.4 87.5  PLT 413* 472* 435*   Basic Metabolic Panel: Recent Labs  Lab 01/06/19 0601 01/06/19 1934 01/07/19 0653 01/08/19 0839 01/11/19 0154 01/12/19 0244  NA 149*  --  147* 143 138 135  K 3.7  --  3.7 3.0* 3.2* 4.2  CL 118*  --  118* 107 101 99  CO2 22  --  23 25 29 24   GLUCOSE 83  --  83 91 80 82  BUN 5*  --  5* <5* 7 6  CREATININE 0.63  --  0.77 0.55* 0.56* 0.65  CALCIUM 8.3*  --  8.0* 8.1* 8.0* 8.1*  PHOS  --  3.7 3.7  --   --   --    GFR: Estimated Creatinine Clearance: 90.8 mL/min (by C-G formula based on SCr of 0.65 mg/dL). Liver Function  Tests: No results for input(s): AST, ALT, ALKPHOS, BILITOT, PROT, ALBUMIN in the last 168 hours. No results for input(s): LIPASE, AMYLASE in the last 168 hours. No results for input(s): AMMONIA in the last 168 hours. Coagulation Profile: No results for input(s): INR, PROTIME in the last 168 hours. Cardiac Enzymes: No results for input(s): CKTOTAL, CKMB, CKMBINDEX, TROPONINI in the last 168 hours. BNP (last 3 results) No results for input(s): PROBNP in the last 8760 hours. HbA1C: No results for input(s): HGBA1C in the last 72 hours. CBG: Recent Labs  Lab 01/07/19 1953 01/07/19 2304 01/08/19 0402 01/08/19 0738 01/08/19 0836  GLUCAP 76 81 74 86 88   Lipid Profile: No results for input(s): CHOL, HDL, LDLCALC, TRIG, CHOLHDL, LDLDIRECT in the last 72 hours. Thyroid Function Tests: No results for input(s): TSH, T4TOTAL, FREET4, T3FREE, THYROIDAB in the last 72 hours. Anemia Panel: No results for input(s): VITAMINB12, FOLATE, FERRITIN, TIBC, IRON, RETICCTPCT in the  last 72 hours. Urine analysis:    Component Value Date/Time   COLORURINE AMBER (A) 12/31/2018 2326   APPEARANCEUR CLOUDY (A) 12/31/2018 2326   LABSPEC 1.026 12/31/2018 2326   PHURINE 5.0 12/31/2018 2326   GLUCOSEU NEGATIVE 12/31/2018 2326   HGBUR SMALL (A) 12/31/2018 2326   BILIRUBINUR SMALL (A) 12/31/2018 2326   KETONESUR 20 (A) 12/31/2018 2326   PROTEINUR 100 (A) 12/31/2018 2326   UROBILINOGEN 0.2 03/01/2011 1427   NITRITE NEGATIVE 12/31/2018 2326   LEUKOCYTESUR NEGATIVE 12/31/2018 2326   Sepsis Labs: @LABRCNTIP (procalcitonin:4,lacticidven:4)  ) Recent Results (from the past 240 hour(s))  Culture, blood (routine x 2)     Status: None   Collection Time: 01/03/19 12:38 PM  Result Value Ref Range Status   Specimen Description BLOOD RIGHT HAND  Final   Special Requests   Final    BOTTLES DRAWN AEROBIC AND ANAEROBIC Blood Culture results may not be optimal due to an inadequate volume of blood received in culture  bottles   Culture   Final    NO GROWTH 5 DAYS Performed at [facility], [address] [place], [place] [address]    Report Status 01/08/2019 FINAL  Final  Culture, blood (routine x 2)     Status: None   Collection Time: 01/03/19 12:38 PM  Result Value Ref Range Status   Specimen Description BLOOD RIGHT ARM  Final   Special Requests   Final    BOTTLES DRAWN AEROBIC ONLY Blood Culture results may not be optimal due to an inadequate volume of blood received in culture bottles   Culture   Final    NO GROWTH 5 DAYS Performed at [facility], [address], [place], [place] [address]    Report Status 01/08/2019 FINAL  Final      Radiology Studies: Dg Chest Port 1 View  Result Date: 01/11/2019 CLINICAL DATA:  Cough.  Weakness. EXAM: PORTABLE CHEST 1 VIEW COMPARISON:  11/07/2019 FINDINGS: Heart size is normal. No pleural effusion identified. Interval improvement in pulmonary edema. Mild patchy persistent opacities within the left upper lobe, left base and right upper lobe. There is an enteric tube with tip below the level of the GE junction. IMPRESSION: 1. Bilateral patchy airspace opacities which may represent multifocal pneumonia. 2. Resolution of pulmonary edema. Electronically Signed   By: Signa Kell M.D.   On: 01/11/2019 11:24   Dg Swallowing Func-speech Pathology  Result Date: 01/11/2019 Objective Swallowing Evaluation: Type of Study: MBS-Modified Barium Swallow Study  Patient Details Name: CLEARANCE CHRISTE MRN: [id number] Date of Birth: 1961/05/07 Today's Date: 01/11/2019 Time: SLP Start Time (ACUTE ONLY): 0845 -SLP Stop Time (ACUTE ONLY): 0900 SLP Time Calculation (min) (ACUTE ONLY): 15 min Past Medical History: Past Medical History: Diagnosis Date . Acute blood loss anemia 02/07/2017 . Anxiety  . Arthritis   deg disease, bulging disk,  shoulder level . Bipolar disorder (HCC)  . Bipolar disorder (HCC)  . 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 Depression   anxiety .  Hyperlipidemia  . Hypertension  . Peptic ulcer disease   Review . Pneumonia  . [name] (peripheral vascular disease) (HCC) 06/18/2015 . Shortness of breath  Past Surgical History: Past Surgical History: Procedure Laterality Date . BIOPSY  02/09/2017  Procedure: BIOPSY;  Surgeon: [name], MD;  [facility];  Service: Endoscopy;;  duodenal gastric . COLONOSCOPY  03/14/2007  [id number]:[id number] colonoscopy and terminal ileoscopy except external hemorrhoids . COLONOSCOPY N/A 05/05/2013  [name]: external/internal anal canal  hemorrhoids, unable to intubate TI, segemental biopsies unremarkable  . COLONOSCOPY N/A 04/11/2017  Procedure: COLONOSCOPY;  Surgeon: [name], MD;  [facility]: [facility];  Service: Endoscopy;  Laterality: N/A; . COLONOSCOPY WITH PROPOFOL N/A 03/31/2017  Procedure: COLONOSCOPY WITH PROPOFOL;  Surgeon: [name], MD;  Location: [facility];  Service: Endoscopy;  Laterality: N/A;  1:45pm . ESOPHAGOGASTRODUODENOSCOPY  03/14/2007  [id number] antral gastritis with bulbar duodenitis/paucity to postbulbar duodenal folds and biopsy were benign with no evidence of villous atrophy. . ESOPHAGOGASTRODUODENOSCOPY (EGD) WITH PROPOFOL N/A 02/09/2017  Procedure: ESOPHAGOGASTRODUODENOSCOPY (EGD) WITH PROPOFOL;  Surgeon: [name], MD;  [facility];  Service: Endoscopy;  Laterality: N/A; . GIVENS CAPSULE STUDY N/A 04/08/2017  Procedure: GIVENS CAPSULE STUDY;  Surgeon: [name], MD;  [facility];  Service: Endoscopy;  Laterality: N/A; . HAND SURGERY    left, secondary to self-inflicted laceration . HEMORRHOID SURGERY N/A 04/14/2017  Procedure: HEMORRHOIDECTOMY;  Surgeon: [name], MD;  Location: [facility];  Service: General;  Laterality: N/A; . SHOULDER SURGERY    right . TOE SURGERY    left great toe , amputated-lawnmover accident HPI: Pt is a 58 y.o. male with PMH significant for alcohol and polysubstance abuse, COPD, bipolar disorder, and chronic diastolic  CHF, presented to ED with poor responsiveness, altered mental status and combativness. He reports to have been out drinking with friends. Pt has had recurring likely alcohol withdrawal seizures during admission. EEG on 1/31 revealed abnormal EEG secondary to general background slowing; finding may be seen with a diffuse disturbance that is etiologically nonspecific, but may include a metabolic encephalopathy or medication effect, among other possibilities. No epileptiform activity was noted. Intubated 1/24- 2/5. CXR 2/8 concerning for multilobar bronchopneumonia. Head CT 2/8 no acute findings. Cortrak now in place. Chest x-ray of 01/07/19 showed patchy opacity of the left lung base superimposed; pneumonia was not excluded.  Subjective: Laying in bed; breathy voice; confused/inattentive Assessment / Plan / Recommendation CHL IP CLINICAL IMPRESSIONS 01/11/2019 Clinical Impression Pt presents with moderate-severe oropharyngeal dysphagia. The oral phase of the swallow was significant for bolus holding and prolonged mastication. Pharyngeally, he presented with a pharyngeal delay which resulted in penetration of thin liquids before and during deglutition and trace vallecular and pyriform sinus residue secondary to reduced lingual retraction, and reduced laryngeal excursion, respectively. Penetration was to the level of the vocal folds and coughing was inconsistently effective in expelling the penetrant. It is recommended that a dysphagia 1 diet with nectar thick liquids be continued at this time and SLP will follow to ensure tolerance of the recommended diet. Dysphagia inervention is also clinically indicated following discharge. ' SLP Visit Diagnosis Dysphagia, oropharyngeal phase (R13.12) Attention and concentration deficit following -- Frontal lobe and executive function deficit following -- Impact on safety and function Mild aspiration risk   CHL IP TREATMENT RECOMMENDATION 01/11/2019 Treatment Recommendations Therapy as  outlined in treatment plan below   Prognosis 01/11/2019 Prognosis for Safe Diet Advancement Good Barriers to Reach Goals Cognitive deficits Barriers/Prognosis Comment -- CHL IP DIET RECOMMENDATION 01/11/2019 SLP Diet Recommendations Dysphagia 1 (Puree) solids;Nectar thick liquid Liquid Administration via Cup;Straw Medication Administration Crushed with puree Compensations Slow rate;Small sips/bites;Minimize environmental distractions;Multiple dry swallows after each bite/sip Postural Changes Remain semi-upright after after feeds/meals (Comment);Seated upright at 90 degrees   CHL IP OTHER RECOMMENDATIONS 01/11/2019 Recommended Consults -- Oral Care Recommendations Oral care BID Other Recommendations --   CHL IP FOLLOW UP RECOMMENDATIONS 01/11/2019 Follow up Recommendations Skilled  Nursing facility   [facility] IP FREQUENCY AND DURATION 01/11/2019 Speech Therapy Frequency (ACUTE ONLY) min 2x/week Treatment Duration 2 weeks      CHL IP ORAL PHASE 01/11/2019 Oral Phase Impaired Oral - Pudding Teaspoon -- Oral - Pudding Cup -- Oral - Honey Teaspoon -- Oral - Honey Cup -- Oral - Nectar Teaspoon -- Oral - Nectar Cup -- Oral - Nectar Straw Holding of bolus Oral - Thin Teaspoon -- Oral - Thin Cup -- Oral - Thin Straw Holding of bolus Oral - Puree Holding of bolus Oral - Mech Soft Impaired mastication Oral - Regular -- Oral - Multi-Consistency -- Oral - Pill -- Oral Phase - Comment --  CHL IP PHARYNGEAL PHASE 01/11/2019 Pharyngeal Phase Impaired Pharyngeal- Pudding Teaspoon -- Pharyngeal -- Pharyngeal- Pudding Cup -- Pharyngeal -- Pharyngeal- Honey Teaspoon -- Pharyngeal -- Pharyngeal- Honey Cup -- Pharyngeal -- Pharyngeal- Nectar Teaspoon Pharyngeal residue - valleculae;Pharyngeal residue - pyriform;Delayed swallow initiation-pyriform sinuses;Reduced tongue base retraction;Reduced anterior laryngeal mobility Pharyngeal -- Pharyngeal- Nectar Cup -- Pharyngeal -- Pharyngeal- Nectar Straw Pharyngeal residue - valleculae;Pharyngeal residue -  pyriform;Delayed swallow initiation-pyriform sinuses;Reduced tongue base retraction;Reduced anterior laryngeal mobility Pharyngeal -- Pharyngeal- Thin Teaspoon Pharyngeal residue - valleculae;Pharyngeal residue - pyriform;Delayed swallow initiation-pyriform sinuses;Reduced tongue base retraction;Reduced anterior laryngeal mobility;Penetration/Aspiration before swallow;Penetration/Aspiration during swallow Pharyngeal Material enters airway, CONTACTS cords and not ejected out;Material enters airway, CONTACTS cords and then ejected out Pharyngeal- Thin Cup -- Pharyngeal -- Pharyngeal- Thin Straw Pharyngeal residue - valleculae;Pharyngeal residue - pyriform;Delayed swallow initiation-pyriform sinuses;Reduced tongue base retraction;Reduced anterior laryngeal mobility;Penetration/Aspiration before swallow;Penetration/Aspiration during swallow Pharyngeal Material enters airway, CONTACTS cords and then ejected out;Material enters airway, CONTACTS cords and not ejected out Pharyngeal- Puree Pharyngeal residue - valleculae;Pharyngeal residue - pyriform;Reduced tongue base retraction;Reduced anterior laryngeal mobility Pharyngeal -- Pharyngeal- Mechanical Soft -- Pharyngeal -- Pharyngeal- Regular -- Pharyngeal -- Pharyngeal- Multi-consistency -- Pharyngeal -- Pharyngeal- Pill -- Pharyngeal -- Pharyngeal Comment --  CHL IP CERVICAL ESOPHAGEAL PHASE 01/11/2019 Cervical Esophageal Phase WFL Pudding Teaspoon -- Pudding Cup -- Honey Teaspoon -- Honey Cup -- Nectar Teaspoon -- Nectar Cup -- Nectar Straw -- Thin Teaspoon -- Thin Cup -- Thin Straw -- Puree -- Mechanical Soft -- Regular -- Multi-consistency -- Pill -- Cervical Esophageal Comment -- Shanika I. Vear Clock, MS, CCC-SLP Acute Rehabilitation Services Office number [phone] Pager [phone] [name] 01/11/2019, 10:26 AM                Scheduled Meds: . chlorhexidine  15 mL Mouth Rinse BID  . folic acid  1 mg Intravenous Daily  . pantoprazole sodium  40 mg  Per Tube Daily  . thiamine injection  100 mg Intravenous Daily  . valproic acid  750 mg Oral BID   Continuous Infusions: . dextrose 5 % and 0.45% NaCl 10 mL/hr at 01/10/19 1653     LOS: 27 days   Time Spent in minutes   30 minutes  Reason Helzer D.O. on 01/12/2019 at 2:33 PM  Between 7am to 7pm - Please see pager noted on [email or web address]  After 7pm go to [email or web address]  And look for the night coverage person covering for me after hours  [facility] Office  [phone]

## 2019-01-12 NOTE — Social Work (Signed)
Had an extended conversation with pt daughter. We discussed SNF and referrals. We discussed that CSW could extend bed search to [facility]. Pt daughter states she prefers to take pt home, pt from [facility] but would be staying permanently with pt daughter at her home.  She states she has equipment at home and would like home health. I discussed need for 24/7 supervision, pt daughter states that she lives in a home with a secured gate around the property, she has quit her job to provide assistance and her husband also owns his own business and can provide support.   CSW requested pt daughter make list of home health equipment that she would need. CSW will contact PT to let them know of pt daughter request and desired disposition.   CSW continuing to follow for support with disposition when medically appropriate.  Octavio Graves, MSW, [facility] Clinical Social Work [phone]

## 2019-01-12 NOTE — Progress Notes (Signed)
Nutrition Follow-up  DOCUMENTATION CODES:   Not applicable  INTERVENTION:   -Magic cup TID with meals, each supplement provides 290 kcal and 9 grams of protein -Hormel Shake TID with meals, 520 kcals and 22 grams protein -MVI with minerals daily  NUTRITION DIAGNOSIS:   Inadequate oral intake related to inability to eat as evidenced by NPO status.  Progressing; just advanced to dysphagia 1 diet with honey thick liquids  GOAL:   Patient will meet greater than or equal to 90% of their needs  Progressing; just advanced to dysphagia 1 diet with honey thick liquids  MONITOR:   PO intake, Supplement acceptance, Diet advancement, Labs, Weight trends, Skin, I & O's  REASON FOR ASSESSMENT:   Consult Enteral/tube feeding initiation and management  ASSESSMENT:   58 yo male with PMH of [name], bipolar d/o, alcohol & marajuana abuse, [name], COPD, HTN who was admitted AMS, elevated ammonia level requiring intubation on admission.  1/24 - AP admission w/ pneumonia 1/24 - intubated and transferred to [facility] 2/5 - extubated w/ Wernicke's encephalopathy 2/14 - Cortrack placed  2/16 - Comfort Care 2/17 - d/c Comfort Care; pursue aggressive measures 2/19- s/p MBSS- advanced to a dysphagia 1 diet with nectar thick liquids  Case discussed with RN, who reports pt is more alert and taking medications well.   Per sitter at bedside, unsure of how pt ate this morning. Pt reports he consumed some eggs for breakfast. Plan to continue to pursue aggressive measures. Discussed with pt importance of good meal and supplement intake to promote healing.   Labs reviewed.   Diet Order:   Diet Order            DIET - DYS 1 Room service appropriate? Yes; Fluid consistency: Nectar Thick  Diet effective now              EDUCATION NEEDS:   Education needs have been addressed  Skin:  Skin Assessment: Reviewed RN Assessment  Last BM:  01/11/19  Height:   Ht Readings from Last 1 Encounters:  01/03/19  5\' 6"  (1.676 m)    Weight:   Wt Readings from Last 1 Encounters:  01/12/19 64.4 kg    Ideal Body Weight:  64.5 kg  BMI:  Body mass index is 22.92 kg/m.  Estimated Nutritional Needs:   Kcal:  1750-1825  Protein:  83-95 grams  Fluid:  > 2L/day    Ronn Smolinsky A. Mayford Knife, RD, LDN, CDE Pager: [phone] After hours Pager: [phone]

## 2019-01-13 DIAGNOSIS — R059 Cough, unspecified: Secondary | ICD-10-CM

## 2019-01-13 DIAGNOSIS — R05 Cough: Secondary | ICD-10-CM

## 2019-01-13 DIAGNOSIS — R1310 Dysphagia, unspecified: Secondary | ICD-10-CM

## 2019-01-13 LAB — BASIC METABOLIC PANEL
Anion gap: 9 (ref 5–15)
BUN: <5 mg/dL — ABNORMAL LOW (ref 6–20)
CO2: 29 mmol/L (ref 22–32)
Calcium: 8.2 mg/dL — ABNORMAL LOW (ref 8.9–10.3)
Chloride: 99 mmol/L (ref 98–111)
Creatinine, Ser: 0.61 mg/dL (ref 0.61–1.24)
GFR calc Af Amer: >60 mL/min (ref >60)
GFR calc non Af Amer: >60 mL/min (ref >60)
Glucose, Bld: 83 mg/dL (ref 70–99)
Potassium: 3.2 mmol/L — ABNORMAL LOW (ref 3.5–5.1)
Sodium: 137 mmol/L (ref 135–145)

## 2019-01-13 LAB — MAGNESIUM: Magnesium: 1.8 mg/dL (ref 1.7–2.4)

## 2019-01-13 MED ORDER — RESOURCE THICKENUP CLEAR PO POWD: ORAL | 0 refills | Status: AC

## 2019-01-13 MED ORDER — FLUOXETINE HCL 20 MG PO CAPS: 40.0000 mg | ORAL_CAPSULE | Freq: Every day | ORAL | Status: DC

## 2019-01-13 MED ORDER — HYDROCOD POLST-CPM POLST ER 10-8 MG/5ML PO SUER: 5.0000 mL | Freq: Every evening | ORAL | 0 refills | Status: DC | PRN

## 2019-01-13 MED ORDER — ASPIRIN EC 81 MG PO TBEC: 81.0000 mg | DELAYED_RELEASE_TABLET | Freq: Every evening | ORAL | Status: DC

## 2019-01-13 MED ORDER — ALBUTEROL SULFATE HFA 108 (90 BASE) MCG/ACT IN AERS: 2.0000 | INHALATION_SPRAY | RESPIRATORY_TRACT | 1 refills | Status: DC | PRN

## 2019-01-13 MED ORDER — PANTOPRAZOLE SODIUM 20 MG PO TBEC: 20.0000 mg | DELAYED_RELEASE_TABLET | Freq: Every day | ORAL | Status: DC

## 2019-01-13 MED ORDER — ALBUTEROL SULFATE (2.5 MG/3ML) 0.083% IN NEBU: 2.5000 mg | INHALATION_SOLUTION | RESPIRATORY_TRACT | 1 refills | Status: DC | PRN

## 2019-01-13 MED ORDER — VITAMIN B-1 100 MG PO TABS: 100.0000 mg | ORAL_TABLET | Freq: Every day | ORAL | 0 refills | Status: DC

## 2019-01-13 MED ORDER — IPRATROPIUM BROMIDE 0.02 % IN SOLN: 0.5000 mg | Freq: Three times a day (TID) | RESPIRATORY_TRACT | 1 refills | Status: DC

## 2019-01-13 MED ORDER — POTASSIUM CHLORIDE 20 MEQ/15ML (10%) PO SOLN: 40.0000 meq | Freq: Every day | ORAL | Status: DC

## 2019-01-13 MED ORDER — METHOCARBAMOL 500 MG PO TABS: 500.0000 mg | ORAL_TABLET | Freq: Three times a day (TID) | ORAL | 0 refills | Status: DC | PRN

## 2019-01-13 MED ORDER — HYDROCODONE-ACETAMINOPHEN 5-325 MG PO TABS: 1.0000 | ORAL_TABLET | Freq: Four times a day (QID) | ORAL | 0 refills | Status: DC | PRN

## 2019-01-13 MED ORDER — ADULT MULTIVITAMIN W/MINERALS CH: 1.0000 | ORAL_TABLET | Freq: Every day | ORAL | 0 refills | Status: DC

## 2019-01-13 MED ORDER — ATORVASTATIN CALCIUM 10 MG PO TABS: 20.0000 mg | ORAL_TABLET | Freq: Every day | ORAL | Status: DC

## 2019-01-13 MED ORDER — FOLIC ACID 1 MG PO TABS: 1.0000 mg | ORAL_TABLET | Freq: Every day | ORAL | 0 refills | Status: DC

## 2019-01-13 NOTE — Social Work (Signed)
Aware disposition plan is for home. Have spoken with [facility] regarding pt.  CSW signing off. Please consult if any additional needs arise.  Doy Hutching, LCSWA [facility] Clinical Social Work [phone]

## 2019-01-13 NOTE — Progress Notes (Addendum)
Was the fall witnessed: yes  Patient condition before and after the fall: OK  Patient's reaction to the fall: None  Name of the doctor that was notified including date and time: Bodenheimer NP 2/21/20at 0330  Any interventions and vital signs: assessed, see VS flow sheet

## 2019-01-13 NOTE — Care Management Note (Addendum)
Case Management Note  Patient Details  Name: MARKEEM WENDLER MRN: [id number] Date of Birth: 08/27/1961  Subjective/Objective:                    Action/Plan:Referral accepted by Jesusita Oka with [facility]. Spoke with Cassie with [facility] , patient's insurance is out of network.   Spoke to [place] at [facility] , they could provide RN,PT, Aide , but not OT or SP.        DME ordered through [facility] with [facility]. Tony's contact number  Provided. Kandee Keen with [facility] will talk with patient, daughter and son in law. Patient does not qualify for [facility]. Kandee Keen will explain to patient and family. Kandee Keen explained to family why patient does not qualify for [facility], also [facility] does not have staffing to cover referral. Alinda Money requesting [facility] called same and spoke to [place] , [place] does not cover any [place] address. Patient and family aware. Family would like to try [facility]. Call placed to [facility] with [facility], await return call to see if [facility] can accept referral.    Spoke with MD, family wants to take patient home today with [facility].  Confirmed with daughter [name] and son in law Alinda Money [phone] at bedside. Provided Medicare.gov list. They want [facility] even if patient does not qualify from home first.   Alinda Money would like to speak directly to [facility]. Call placed to [facility] awaiting call back.    Requesting walker to be delivered to patient's hospital room before discharge, and 3 in 1 , wheel chair and hospital bed to be delivered to home. Alinda Money is the contact for delivery.  Nurse checking oxygen saturation.   Confirmed PCP is DR Gareth Morgan.  Alinda Money and [name] moving patient to there home at discharge: [address] [place], [place], [place] [address]   They plan to take patient home by private car and do not need DME delivered to home prior to discharge. Expected Discharge Date:                  Expected Discharge Plan:  Home w Home Health  Services  In-House Referral:  Clinical Social Work  Discharge planning Services  CM Consult  Post Acute Care Choice:  Durable Medical Equipment, Home Health Choice offered to:  Patient, Adult Children  DME Arranged:  3-N-1, Walker rolling, Wheelchair manual, Hospital bed DME Agency:  [facility]  HH Arranged:  RN, Disease Management, PT, OT, Nurse's Aide, Social Work [employer] Agency:  [facility]  Status of Service:  In process, will continue to follow  If discussed at Long Length of Stay Meetings, dates discussed:    Additional Comments:  Kingsley Plan, RN 01/13/2019, 10:14 AM

## 2019-01-13 NOTE — Progress Notes (Signed)
Called to room by son in law and notified RN that patient was on sitting on the floor. Patient denies pain and no injury noted. Educated family earlier in the shift that bed alarm needs to be turned on but they said there is no need as they will be in the room and they don't want to be disturbed every time it goes on. I told them it is not for their convenience but it is for the patients' safety. Will place bed alarm on and tele monitor even family in the room with patient.

## 2019-01-13 NOTE — Progress Notes (Signed)
Called by tele sitter reporting that family unplugged the monitor. Went into patients' room and spoke with daughter and she stated he don't need the monitor during the day. Explained to her that it does not work that way but then she got very upset.

## 2019-01-13 NOTE — Progress Notes (Signed)
Physical Therapy Treatment Patient Details Name: Jeffrey [name] MRN: [id number] DOB: [birth year] Today's Date: 01/13/2019    History of Present Illness Pt is a 58 year old man admitted 12/16/18 with respiratory distress due to PNA. Intubated 1/24-2/5. Pt with ETOH withdrawal seizure on 1/27. +cocaine upon admission. PMH: bipolar, COPD, alcohol abuse, HTN, [name], depression, CHF.    PT Comments    Pt and family excited about d/c home today. Pt is making great progress towards his goals, however is limited in safe mobility by decreased safety awareness, balance, strength and endurance. Pt requires min guard for bed mobility, and minA for transfers and ambulation of 30 feet with RW secondary to decreased balance and endurance. Pt oxygen saturation was monitored during ambulation and was maintained above 92%O2 throughout ambulation (see General Comments). Given pt's increased mobility since last session, PT can now recommend d/c home with HHPT, however pt will require 24 hour supervision given his repeated falling. Family verbalized understanding.     Follow Up Recommendations  Home health PT;Supervision/Assistance - 24 hour     Equipment Recommendations  Rolling walker with 5" wheels;3in1 (PT)       Precautions / Restrictions Precautions Precautions: Fall Restrictions Weight Bearing Restrictions: No    Mobility  Bed Mobility Overal bed mobility: Needs Assistance Bed Mobility: Sit to Supine       Sit to supine: Min guard   General bed mobility comments: min guard for supervision   Transfers Overall transfer level: Needs assistance Equipment used: Rolling walker (2 wheeled)   Sit to Stand: Min assist         General transfer comment: minA for powerup and steadying  Ambulation/Gait Ambulation/Gait assistance: Min assist Gait Distance (Feet): 35 Feet Assistive device: Rolling walker (2 wheeled) Gait Pattern/deviations: Step-to pattern;Decreased step length -  right;Decreased step length - left;Shuffle Gait velocity: slowed Gait velocity interpretation: <1.8 ft/sec, indicate of risk for recurrent falls General Gait Details: minA for steadying with RW, vc for navigation around obstacles with RW         Balance Overall balance assessment: Needs assistance Sitting-balance support: Bilateral upper extremity supported;No upper extremity supported Sitting balance-Leahy Scale: Fair Sitting balance - Comments: able to sit EoB without support   Standing balance support: Single extremity supported;During functional activity Standing balance-Leahy Scale: Poor Standing balance comment: for stability pt requires single UE support for balance                            Cognition Arousal/Alertness: Awake/alert Behavior During Therapy: Flat affect;Restless Overall Cognitive Status: Difficult to assess                                           General Comments General comments (skin integrity, edema, etc.): Pt on RA on entry SaO2 100%O2, HR 95bpm, with ambulation SaO2 dropped to 92%O2 on RA and HR reached a max of 115 bpm. Family educated on need for constant supervision especially at night as pt with decreased safety awareness with getting up.       Pertinent Vitals/Pain Pain Assessment: No/denies pain           PT Goals (current goals can now be found in the care plan section) Acute Rehab PT Goals PT Goal Formulation: Patient unable to participate in goal setting Time For Goal Achievement: 01/13/19 Potential to Achieve  Goals: Fair Progress towards PT goals: Progressing toward goals    Frequency    Min 2X/week      PT Plan Discharge plan needs to be updated;Equipment recommendations need to be updated       AM-PAC PT "6 Clicks" Mobility   Outcome Measure  Help needed turning from your back to your side while in a flat bed without using bedrails?: A Little Help needed moving from lying on your back to  sitting on the side of a flat bed without using bedrails?: A Little Help needed moving to and from a bed to a chair (including a wheelchair)?: A Little Help needed standing up from a chair using your arms (e.g., wheelchair or bedside chair)?: A Little Help needed to walk in hospital room?: A Little Help needed climbing 3-5 steps with a railing? : A Lot 6 Click Score: 17    End of Session Equipment Utilized During Treatment: Gait belt Activity Tolerance: Patient limited by fatigue Patient left: in chair;with call bell/phone within reach;with chair alarm set;with family/visitor present Nurse Communication: Mobility status PT Visit Diagnosis: Other abnormalities of gait and mobility (R26.89);Muscle weakness (generalized) (M62.81)     Time: 0175-1025 PT Time Calculation (min) (ACUTE ONLY): 30 min  Charges:  $Gait Training: 23-37 mins                     Jeffrey [name] B. Beverely Risen PT, DPT Acute Rehabilitation Services Pager [phone] Office [phone]    Elon Alas Fleet 01/13/2019, 10:23 AM

## 2019-01-13 NOTE — Care Management (Signed)
    Durable Medical Equipment  (From admission, onward)         Start     Ordered   01/13/19 1004  For home use only DME Hospital bed  Once    Question Answer Comment  Patient has (list medical condition): Acute toxic encephalopathy, aspiration pneumonia   The above medical condition requires: Patient requires the ability to reposition frequently   Head must be elevated greater than: 45 degrees   Bed type Semi-electric      01/13/19 1004   01/13/19 1003  For home use only DME standard manual wheelchair with seat cushion  Once    Comments:  Patient suffers from Aspiration pneumonia, Acute toxic encephalopathy which impairs their ability to perform daily activities like ambulating in the home.  A cane will not resolve  issue with performing activities of daily living. A wheelchair will allow patient to safely perform daily activities. Patient can safely propel the wheelchair in the home or has a caregiver who can provide assistance.  Accessories: elevating leg rests (ELRs), wheel locks, extensions and anti-tippers.   01/13/19 1004   01/13/19 1002  For home use only DME Walker rolling  Once    Question:  Patient needs a walker to treat with the following condition  Answer:  Aspiration pneumonia (HCC)   01/13/19 1004   01/13/19 1002  For home use only DME 3 n 1  Once     01/13/19 1004

## 2019-01-13 NOTE — Progress Notes (Signed)
Speech Language Pathology Treatment: Dysphagia  Patient Details Name: Jeffrey [name] MRN: [id number] DOB: 1961/09/07 Today's Date: 01/13/2019 Time: 6644-0347 SLP Time Calculation (min) (ACUTE ONLY): 19 min  Assessment / Plan / Recommendation Clinical Impression  Pt was seen for dysphagia treatment to assess his ability to tolerate more advanced consistencies. He was alert and pt's daughter, [name], admitted that she gave the pt [employer] chicken nuggets yesterday which were "cut really really fine" and indicated that he tolerated them well. Pt's daughter was educated regarding the rationale behind his current diet and advised to wait until he is evaluated by the home health SLP and an upgraded diet is recommended by them. Pt's family was also educated extensively regarding the need for oral care to reduce oral pathogens and reduce risk of aspiration pneumonia. Education was further provided regarding signs of aspiration and swallowing precautions to reduce aspiration risk. Pt's family verbalized understanding as well as agreement with all areas of education. He tolerated trials of dysphagia 2 solids without overt s/sx of aspiration. Mastication time was mildly increased but functional considering that his dentures were not in the hospital. A dyspagia 1 diet is still recommended at this time but the pt may be able to be upgraded further after he is seen by the home health SLP.    HPI HPI: Pt is a 58 y.o. male with PMH significant for alcohol and polysubstance abuse, COPD, bipolar disorder, and chronic diastolic CHF, presented to ED with poor responsiveness, altered mental status and combativness. He reports to have been out drinking with friends. Pt has had recurring likely alcohol withdrawal seizures during admission. EEG on 1/31 revealed abnormal EEG secondary to general background slowing; finding may be seen with a diffuse disturbance that is etiologically nonspecific, but may include a  metabolic encephalopathy or medication effect, among other possibilities. No epileptiform activity was noted. Intubated 1/24- 2/5. CXR 2/8 concerning for multilobar bronchopneumonia. Head CT 2/8 no acute findings. Cortrak now in place. Chest x-ray of 01/07/19 showed patchy opacity of the left lung base superimposed; pneumonia was not excluded.      SLP Plan  Continue with current plan of care       Recommendations  Diet recommendations: Dysphagia 1 (puree);Nectar-thick liquid Liquids provided via: Cup;Straw Medication Administration: Crushed with puree Supervision: Patient able to self feed;Full supervision/cueing for compensatory strategies Compensations: Slow rate;Small sips/bites;Minimize environmental distractions Postural Changes and/or Swallow Maneuvers: Seated upright 90 degrees                Oral Care Recommendations: Oral care BID Follow up Recommendations: Home health SLP(Pt's family has decided on home health) SLP Visit Diagnosis: Dysphagia, oropharyngeal phase (R13.12) Plan: Continue with current plan of care       Bertie Mcconathy I. Vear Clock, MS, CCC-SLP Acute Rehabilitation Services Office number [phone] Pager [phone]               [name] 01/13/2019, 12:53 PM

## 2019-01-13 NOTE — Plan of Care (Signed)
Nutrition Education Note  RD consulted for nutrition education. Per RN, pt daughter requesting diet education prior to d/c home today.   Spoke with pt and daughter at bedside. Pt is much more alert than prior visit and is excited to go home.   Pt's daughter's largest concern is longevity of dysphagia diet- discussed rationale for current diet order based on most recent MBSS as well as consequences (aspiration) if non-compliant with recommendations. Also discussed SLP follow-up for possible diet upgrade as pt gets stronger and progresses  RD provided "National Dysphagia Diet Pureed Nutrition Therapy" and "Thickened Liquid Nutrition Therapy" handout from the Academy of Nutrition and Dietetics. Reviewed rationale for current diet order and how favorite foods could be mechanically altered to achieve pureed texture. Discussed use of a blender/food processor with the addition of gravies/juices/sauces as well as using potato flakes, pureed veggies, and cornstarch to thicken foods. Also reviewed allowed liquids that were naturally nectar thick consistency (ex tomato juice, fruit nectar, cream soups, and buttermilk). Also encouraged use of commercial thickeners to help thicken favorite beverages to appropriate consistency.   RD discussed why it is important for patient to adhere to diet recommendations. Teach back method used.  Expect fair to good compliance.  Body mass index is 22.92 kg/m. Pt meets criteria for normal weight range based on current BMI.  Current diet order is dysphagia 1 with nectar thic liquids, patient is consuming approximately 0-25% of meals at this time. Labs and medications reviewed. RD contact information provided. Plan for d/c home today with family and home health services.   Jeffrey [name] A. Mayford Knife, RD, LDN, CDE Pager: [phone] After hours Pager: [phone]

## 2019-01-13 NOTE — Discharge Summary (Signed)
Physician Discharge Summary  Jeffrey [name] YQM:[id number] DOB: 1960-11-30 DOA: 12/16/2018  PCP: Gareth Morgan, MD  Admit date: 12/16/2018 Discharge date: 01/13/2019  Time spent: 45 minutes  Recommendations for Outpatient Follow-up:  Patient will be discharged to home with home health services.  Patient will need to follow up with primary care provider within one week of discharge, repeat BMP.  Patient should continue medications as prescribed.  Patient should follow a dysphagia diet.   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 Madelynn Done [name] a 58 y.o.malewith medical history significant foralcohol and polysubstance abuse, COPD, bipolar disorder, and chronic diastolic CHF, now presenting to the emergency department after being found poorly responsive. Patient is unable to contribute to the history secondary to his clinical condition. He was last seen in his usual state 2 days ago. He had reportedly been drinking alcohol with friends. He was found today in bed, difficult to wake, and was brought into the ED for evaluation of this. Patient would wake up and say his name in the ED, but was not providing any history.   Hospital Course:  Interim history Presented with respiratory failure due to Klebsiella pneumonia, was intubated 12/16/2018 and extubated 12/28/2018.  Patient completed course of antibiotics.  Also noted to have a alcohol withdrawal seizures.   Palliative care was consulted.  Currently patient's mental status is improving.  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  MRI brain 12/22/2018 unrevealing. -Continue thiamine, folate, Depakote  Aspiration pneumonia -Chest x-ray done showed left lower lobe infiltrate.  Currently he is afebrile and leukocytosis is improved. -Was treated with antibiotics earlier this admission and completed course -Chest x-ray today may represent multifocal pneumonia -Speech therapy consulted, currently on dysphagia diet -NG tube discontinued on 01/11/2019 -continue incentive spirometry and flutter vlave -will discharge with antitussive QHS  Alcohol/Drug abuse -Continue CIWA protocol, currently no signs of withdrawal -Tox screen Cocaine and THC positive  Diarrhea -C. difficile was negative  COPD -Currently well compensated -continue home medications   Bipolar disorder with severe depression -Continue home depakote 1500mg  daily   Chronic diastolic heart failure -Currently euvolemic, compensated -Continue to monitor intake and output, daily weights  Acute respiratory failure with hypoxemia -Likely secondary to the above, with pneumonia -Patient required intubation extubated successfully -Continue to monitor -Saturations have remained stable -patient able to ambulate, maintaining O2 saturations in the 90s on room air  Chronic back pain  -per family it seems that he was drinking and compensating with other "things" to help his pain -advised patient and family to follow up with PCP and pain management -will discharge with 5 day supply of medications as well as muscle relaxer as patient has been receiving IV morphine frequently during hospitalization   Hypernatremia -Resolved  Hypokalemia -Replaced, repeat BMP in one week  Acute kidney injury -Resolved, Creatinine  stable  Thrombocytosis -Likely reactive, improved  Deconditioning -Likely secondary to prolonged hospitalization and weakness.  PT recommended SNF however patient improving and now recommending home health. -Case Management consulted for home health needs.  Consultants PCCM Palliative care  Procedures  Intubation/extubation  Consultations: Consultants PCCM Palliative care   Discharge Exam: Vitals:   01/13/19 0142 01/13/19 0416  BP: 133/73 129/78  Pulse: 99 99  Resp: 18 20  Temp: 98.6 F (37 C) 98.9 F (37.2 C)  SpO2: 93% 95%     General: Well developed, chronically ill-appearing, NAD  HEENT: NCAT, mucous membranes moist.  Cardiovascular: S1 S2 auscultated, RRR, no murmur  Respiratory: Clear to auscultation bilaterally with equal chest rise  Abdomen: Soft, nontender, nondistended, + bowel sounds  Extremities: warm dry without cyanosis clubbing or edema  Neuro: AAOx3, nonfocal  Psych: Pleasant, appropriate mood and affect  Discharge Instructions Discharge Instructions    Discharge instructions   Complete by:  As directed    Patient will be discharged to home with home health services.  Patient will need to follow up with primary care provider within one week of discharge, repeat BMP.  Patient should continue medications as prescribed.  Patient should follow a dysphagia diet.     Allergies as of 01/13/2019      Reactions   Augmentin [amoxicillin-pot Clavulanate] Rash, Other (See Comments)   Has patient had a PCN reaction causing immediate rash, facial/tongue/throat swelling, SOB or lightheadedness with hypotension: No Has patient had a PCN reaction causing severe rash involving mucus membranes or skin necrosis: No Has patient had a PCN reaction that required hospitalization No Has patient had a PCN reaction occurring within the last 10 years: Yes If all of the above answers are "NO", then may proceed with Cephalosporin use.   Ace Inhibitors Hives       Medication List    TAKE these medications   albuterol 108 (90 Base) MCG/ACT inhaler Commonly known as:  PROAIR HFA Inhale 2 puffs into the lungs every 4 (four) hours as needed for wheezing or shortness of breath.   albuterol (2.5 MG/3ML) 0.083% nebulizer solution Commonly known as:  PROVENTIL Take 3 mLs (2.5 mg total) by nebulization every 4 (four) hours as needed for shortness of breath.   aspirin EC 81 MG tablet Take 81 mg by mouth every evening.   atorvastatin 20 MG tablet Commonly known as:  LIPITOR Take 1 tablet (20 mg total) by mouth daily. For high cholesterol   budesonide-formoterol 160-4.5 MCG/ACT inhaler Commonly known as:  SYMBICORT Inhale 2 puffs into the lungs 2 (two) times daily.   chlorpheniramine-HYDROcodone 10-8 MG/5ML Suer Commonly known as:  TUSSIONEX PENNKINETIC ER Take 5 mLs by mouth at bedtime as needed for cough.   divalproex 500 MG 24 hr tablet Commonly known as:  DEPAKOTE ER Take 3 tablets (1,500 mg total) by mouth at bedtime.   FLUoxetine 40 MG capsule Commonly known as:  PROZAC Take 1 capsule (40 mg total) by mouth daily. For mood control   folic acid 1 MG tablet Commonly known as:  FOLVITE Take 1 tablet (1 mg total) by mouth daily.   furosemide 20 MG tablet Commonly known as:  LASIX Take 1 tablet (20 mg total) by mouth daily.   gabapentin 400 MG capsule Commonly known as:  NEURONTIN Take 1 capsule (400 mg total) by mouth 3 (three) times daily.   HYDROcodone-acetaminophen 5-325 MG tablet Commonly known as:  NORCO/VICODIN Take 1 tablet by mouth every 6 (six) hours as needed for moderate pain.   ipratropium 0.02 % nebulizer solution Commonly known as:  ATROVENT Take 2.5 mLs (0.5 mg total) by nebulization 3 (three) times daily.   methocarbamol 500 MG tablet  Commonly known as:  ROBAXIN Take 1 tablet (500 mg total) by mouth every 8 (eight) hours as needed for muscle spasms.   metoprolol tartrate 50 MG tablet Commonly known as:   LOPRESSOR Take 1 tablet (50 mg total) by mouth 2 (two) times daily.   multivitamin with minerals Tabs tablet Take 1 tablet by mouth daily.   OLANZapine 10 MG tablet Commonly known as:  ZYPREXA Take 1.5 tablets (15 mg total) by mouth at bedtime. For mood stability   pantoprazole 20 MG tablet Commonly known as:  PROTONIX Take 1 tablet (20 mg total) by mouth daily.   potassium chloride SA 20 MEQ tablet Commonly known as:  K-DUR,KLOR-CON Take 1 tablet (20 mEq total) by mouth daily.   RESOURCE THICKENUP CLEAR Powd Use as needed to thicken liquids   thiamine 100 MG tablet Commonly known as:  VITAMIN B-1 Take 1 tablet (100 mg total) by mouth daily.   Tiotropium Bromide-Olodaterol 2.5-2.5 MCG/ACT Aers Commonly known as:  STIOLTO RESPIMAT Inhale 2 puffs into the lungs daily.   traZODone 50 MG tablet Commonly known as:  DESYREL Take 1 tablet (50 mg total) by mouth at bedtime as needed for sleep.            Durable Medical Equipment  (From admission, onward)         Start     Ordered   01/13/19 1004  For home use only DME Hospital bed  Once    Question Answer Comment  Patient has (list medical condition): Acute toxic encephalopathy, aspiration pneumonia   The above medical condition requires: Patient requires the ability to reposition frequently   Head must be elevated greater than: 45 degrees   Bed type Semi-electric      01/13/19 1004   01/13/19 1003  For home use only DME standard manual wheelchair with seat cushion  Once    Comments:  Patient suffers from Aspiration pneumonia, Acute toxic encephalopathy which impairs their ability to perform daily activities like ambulating in the home.  A cane will not resolve  issue with performing activities of daily living. A wheelchair will allow patient to safely perform daily activities. Patient can safely propel the wheelchair in the home or has a caregiver who can provide assistance.  Accessories: elevating leg rests (ELRs),  wheel locks, extensions and anti-tippers.   01/13/19 1004   01/13/19 1002  For home use only DME Walker rolling  Once    Question:  Patient needs a walker to treat with the following condition  Answer:  Aspiration pneumonia (HCC)   01/13/19 1004   01/13/19 1002  For home use only DME 3 n 1  Once     01/13/19 1004         Allergies  Allergen Reactions  . Augmentin [Amoxicillin-Pot Clavulanate] Rash and Other (See Comments)    Has patient had a PCN reaction causing immediate rash, facial/tongue/throat swelling, SOB or lightheadedness with hypotension: No Has patient had a PCN reaction causing severe rash involving mucus membranes or skin necrosis: No Has patient had a PCN reaction that required hospitalization No Has patient had a PCN reaction occurring within the last 10 years: Yes If all of the above answers are "NO", then may proceed with Cephalosporin use.  Donivan Scull Inhibitors Hives   Follow-up Information    Gareth Morgan, MD. Schedule an appointment as soon as possible for a visit in 1 week(s).   Specialty:  Family Medicine Why:  Hospital follow up Contact information:  [address] [place] [address] [phone]            The results of significant diagnostics from this hospitalization (including imaging, microbiology, ancillary and laboratory) are listed below for reference.    Significant Diagnostic Studies: Ct Abdomen Wo Contrast  Result Date: 01/09/2019 CLINICAL DATA:  58 year old male with need for gastrostomy tube EXAM: CT ABDOMEN WITHOUT CONTRAST TECHNIQUE: Multidetector CT imaging of the abdomen was performed following the standard protocol without IV contrast. COMPARISON:  Chest CT 01/04/2018 FINDINGS: Lower chest: Mixed ground-glass and nodular opacity of the left greater than right lung bases in a predominantly peribronchovascular distribution. Small bilateral pleural effusions. Hepatobiliary: Unremarkable liver.  Unremarkable gallbladder Pancreas:  Unremarkable pancreas Spleen: Unremarkable spleen Adrenals/Urinary Tract: Unremarkable appearance of the adrenal glands. Unremarkable appearance of the kidneys. Stomach/Bowel: Weighted tip enteric feeding tube terminates in the stomach. Unremarkable appearance of the visualized small bowel and colon. Vascular/Lymphatic: Atherosclerotic calcification of the aorta. Other: None Musculoskeletal: No acute displaced fracture. Mild degenerative changes. IMPRESSION: No acute CT finding of the abdomen. Weighted tip enteric feeding tube terminates in the stomach. Aortic atherosclerosis.  Aortic Atherosclerosis (ICD10-I70.0). Electronically Signed   By: Gilmer Mor D.O.   On: 01/09/2019 19:32   Dg Chest 1 View  Result Date: 12/31/2018 CLINICAL DATA:  58 year old male with history of hypoxia. Productive cough. EXAM: CHEST  1 VIEW COMPARISON:  Chest x-ray 12/30/2018. FINDINGS: Diffuse peribronchial cuffing and patchy interstitial prominence, most evident throughout the mid to lower lungs bilaterally, slightly improved compared to the prior study. No confluent consolidative airspace disease. No pleural effusions. No evidence of pulmonary edema. Heart size is normal. The patient is rotated to the right on today's exam, resulting in distortion of the mediastinal contours and reduced diagnostic sensitivity and specificity for mediastinal pathology. IMPRESSION: 1. Diffuse peribronchial cuffing and interstitial prominence concerning for severe bronchitis and probable multilobar bronchopneumonia, slightly improved compared to the prior study. Electronically Signed   By: Trudie Reed M.D.   On: 12/31/2018 20:30   Dg Abd 1 View  Result Date: 12/23/2018 CLINICAL DATA:  Abdominal pain.  Possible bowel obstruction. EXAM: ABDOMEN - 1 VIEW COMPARISON:  None. FINDINGS: NG tube is in place with both the side port and tip in the body of the stomach. The stomach is distended. There is also gaseous distention of small and large bowel.  No evidence of free intraperitoneal air on supine images. IMPRESSION: OG tube in good position and distended stomach. Gaseous distention of small and large bowel most compatible with ileus. Electronically Signed   By: Drusilla Kanner M.D.   On: 12/23/2018 15:02   Ct Head Wo Contrast  Result Date: 12/31/2018 CLINICAL DATA:  Encephalopathy. No reported injury. EXAM: CT HEAD WITHOUT CONTRAST TECHNIQUE: Contiguous axial images were obtained from the base of the skull through the vertex without intravenous contrast. COMPARISON:  12/16/2018 head CT. FINDINGS: Brain: No evidence of parenchymal hemorrhage or extra-axial fluid collection. No mass lesion, mass effect, or midline shift. No CT evidence of acute infarction. Stable small perivesical spaces in the right basal ganglia. Cerebral volume is age appropriate. No ventriculomegaly. Vascular: No acute abnormality. Skull: No evidence of calvarial fracture. Sinuses/Orbits: No fluid levels. Mild mucoperiosteal thickening throughout the paranasal sinuses. Other:  The mastoid air cells are unopacified. IMPRESSION: 1. No evidence of acute intracranial abnormality. 2. Mild paranasal sinusitis of uncertain acuity. Electronically Signed   By: Delbert Phenix M.D.   On: 12/31/2018 22:53   Ct Head Wo Contrast  Result Date: 12/16/2018 CLINICAL DATA:  58 year old male with altered mental status. EXAM: CT HEAD WITHOUT CONTRAST TECHNIQUE: Contiguous axial images were obtained from the base of the skull through the vertex without intravenous contrast. COMPARISON:  Head CT dated 04/12/2017 an MRI dated 04/12/2017 FINDINGS: Brain: The ventricles and sulci appropriate size for patient's age. Minimal periventricular and deep white matter chronic microvascular ischemic changes noted. There is no acute intracranial hemorrhage. No mass effect or midline shift. No extra-axial fluid collection. Vascular: No hyperdense vessel or unexpected calcification. Skull: Normal. Negative for fracture or  focal lesion. Sinuses/Orbits: There is opacification of the nasal passages. The main there of the visualized paranasal sinuses and mastoid air cells are clear. Other: An endotracheal and an enteric tube are partially visualized. IMPRESSION: No acute intracranial pathology. Electronically Signed   By: Elgie Collard M.D.   On: 12/16/2018 03:49   Mr Brain Wo Contrast  Result Date: 12/22/2018 CLINICAL DATA:  Encephalopathy.  Altered level of consciousness. EXAM: MRI HEAD WITHOUT CONTRAST TECHNIQUE: Multiplanar, multiecho pulse sequences of the brain and surrounding structures were obtained without intravenous contrast. COMPARISON:  CT head 12/16/2018 FINDINGS: Brain: Ventricle size and cerebral volume normal. Negative for acute infarct. Negative for hemorrhage or mass. No midline shift. Vascular: Normal arterial flow voids Skull and upper cervical spine: Negative Sinuses/Orbits: Mild mucosal edema paranasal sinuses. Normal orbit. Patient is intubated. Other: None IMPRESSION: No acute abnormality. Electronically Signed   By: Marlan Palau M.D.   On: 12/22/2018 14:07   Dg Chest Port 1 View  Result Date: 01/11/2019 CLINICAL DATA:  Cough.  Weakness. EXAM: PORTABLE CHEST 1 VIEW COMPARISON:  11/07/2019 FINDINGS: Heart size is normal. No pleural effusion identified. Interval improvement in pulmonary edema. Mild patchy persistent opacities within the left upper lobe, left base and right upper lobe. There is an enteric tube with tip below the level of the GE junction. IMPRESSION: 1. Bilateral patchy airspace opacities which may represent multifocal pneumonia. 2. Resolution of pulmonary edema. Electronically Signed   By: Signa Kell M.D.   On: 01/11/2019 11:24   Dg Chest Port 1 View  Result Date: 01/08/2019 CLINICAL DATA:  Assess for aspiration pneumonia. EXAM: PORTABLE CHEST 1 VIEW COMPARISON:  December 31, 2018 FINDINGS: The heart size and mediastinal contours are within normal limits. There is interstitial  edema. Mild patchy opacity of the left lung base is identified. Feeding tube is identified with distal tip not included on the film. The visualized skeletal structures are unremarkable. IMPRESSION: Mild interstitial edema. Patchy opacity of the left lung base superimposed pneumonia is not excluded. Electronically Signed   By: Sherian Rein M.D.   On: 01/08/2019 00:28   Dg Chest Port 1 View  Result Date: 12/30/2018 CLINICAL DATA:  Respiratory failure EXAM: PORTABLE CHEST 1 VIEW COMPARISON:  12/29/2018 FINDINGS: Cardiac shadow is stable. Lungs are well aerated bilaterally. Increased vascular congestion and pulmonary edema is noted slightly improved when compare with the prior exam. Some posteriorly layering effusions may be present bilaterally. No bony abnormality is seen. IMPRESSION: Changes of CHF and pulmonary edema although mildly improved from the prior exam. Electronically Signed   By: Alcide Clever M.D.   On: 12/30/2018 08:23   Dg Chest Port 1 View  Result Date: 12/29/2018 CLINICAL DATA:  Respiratory failure EXAM: PORTABLE CHEST 1 VIEW COMPARISON:  12/28/2018 FINDINGS: Endotracheal tube and NG tube removed. Progression of diffuse bilateral airspace disease which is symmetric and may represent edema. Small bilateral pleural effusions and bibasilar atelectasis. IMPRESSION:  Endotracheal tube and NG tube removed Progression of pulmonary edema. Electronically Signed   By: Marlan Palau M.D.   On: 12/29/2018 06:50   Dg Chest Port 1 View  Result Date: 12/28/2018 CLINICAL DATA:  Respiratory failure. EXAM: PORTABLE CHEST 1 VIEW COMPARISON:  Yesterday FINDINGS: The endotracheal tube tip is between the clavicular heads and carina. The orogastric tube tip reaches the proximal stomach. Unchanged interstitial coarsening and hazy lower lung opacity, symmetry favoring edema. Normal heart size. No definite pleural fluid. No pneumothorax. IMPRESSION: Stable hardware positioning and lower lung opacification.  Electronically Signed   By: Marnee Spring M.D.   On: 12/28/2018 08:13   Dg Chest Port 1 View  Result Date: 12/27/2018 CLINICAL DATA:  Followup ventilator support EXAM: PORTABLE CHEST 1 VIEW COMPARISON:  12/26/2018 FINDINGS: Endotracheal tube tip is 3 cm above the carina. Nasogastric tube enters the stomach. There is improved aeration in both lower lobes. No worsening or new finding. IMPRESSION: Improving lower lobe infiltrate/pneumonia with better aeration. Electronically Signed   By: Paulina Fusi M.D.   On: 12/27/2018 07:16   Dg Chest Port 1 View  Result Date: 12/26/2018 CLINICAL DATA:  Respiratory failure EXAM: PORTABLE CHEST 1 VIEW COMPARISON:  12/23/2018 FINDINGS: Endotracheal tube tip is 21 mm above the carina. The orogastric tube reaches the stomach. Diffuse interstitial coarsening with hazy lower chest opacities, increased. No evident pneumothorax. Normal heart size. IMPRESSION: 1. Lower endotracheal tube, tip 2 cm above the carina. 2. Increased bilateral interstitial interstitial opacity favoring edema. Electronically Signed   By: Marnee Spring M.D.   On: 12/26/2018 07:18   Dg Chest Port 1 View  Result Date: 12/23/2018 CLINICAL DATA:  Reason for chest exam: Aspiration into airway. Hx of HTN, PNA, COPD. EXAM: PORTABLE CHEST 1 VIEW COMPARISON:  12/20/2018 and older exams. FINDINGS: Since the previous study, interstitial airspace opacities in the left mid to lower lung have improved. Residual lung opacities are primarily interstitial type opacities in the medial lung bases, left greater than right. No new lung abnormalities. No pneumothorax. Endotracheal tube and nasal/orogastric tube are stable and well positioned. IMPRESSION: 1. Improved left mid to lower lung pneumonia. 2. No new abnormalities. Electronically Signed   By: Amie Portland M.D.   On: 12/23/2018 15:02   Dg Chest Port 1 View  Result Date: 12/20/2018 CLINICAL DATA:  Respiratory failure EXAM: PORTABLE CHEST 1 VIEW COMPARISON:   Yesterday FINDINGS: Endotracheal tube tip between the clavicular heads and carina. New orogastric tube reaches the stomach. Extensive pneumonia on the left and likely at the medial right base. Possible small left effusion. No pneumothorax. No cardiomegaly IMPRESSION: Unchanged hardware positioning and pneumonia. Electronically Signed   By: Marnee Spring M.D.   On: 12/20/2018 07:40   Dg Chest Port 1 View  Result Date: 12/19/2018 CLINICAL DATA:  Respiratory failure. EXAM: PORTABLE CHEST 1 VIEW COMPARISON:  12/18/2018 FINDINGS: Endotracheal tube terminates 3 cm above the carina. Enteric tube terminates over the stomach. The cardiomediastinal silhouette is unchanged. Extensive opacities throughout the left mid and lower lung have increased. Milder opacities persist in the right perihilar region and right lung base. There may be a small left pleural effusion. No pneumothorax is identified. IMPRESSION: Worsening left greater than right lung opacities suggesting pneumonia. Electronically Signed   By: Sebastian Ache M.D.   On: 12/19/2018 06:46   Portable Chest X-ray  Result Date: 12/18/2018 CLINICAL DATA:  Endotracheal and enteric tube placement. EXAM: PORTABLE CHEST 1 VIEW COMPARISON:  20 minutes prior. FINDINGS:  Endotracheal tube tip just below the thoracic inlet 5.7 cm from the carina. Enteric tube in place with tip and side-port below the diaphragm. Unchanged bilateral perihilar opacities, left greater than right. Unchanged heart size and mediastinal contours. No other new abnormalities. IMPRESSION: 1. Endotracheal tube tip 5.7 cm from the carina. Tip and side port of the enteric tube below the diaphragm. 2. Unchanged bilateral perihilar opacities, left greater than right. Differential considerations include pulmonary edema or aspiration/pneumonia. Electronically Signed   By: Narda Rutherford M.D.   On: 12/18/2018 00:45   Dg Chest Port 1 View  Result Date: 12/18/2018 CLINICAL DATA:  Respiratory distress.  EXAM: PORTABLE CHEST 1 VIEW COMPARISON:  Radiograph earlier this day, additional priors. FINDINGS: Endotracheal and enteric tubes have been removed. Development of left greater than right patchy perihilar opacities since exam earlier this day. Unchanged heart size and mediastinal contours. No large pleural effusion. No pneumothorax. Chronic hyperinflation. IMPRESSION: Development of left greater than right patchy perihilar opacities since exam earlier this day. Differential considerations include pulmonary edema versus aspiration if there is history of vomiting. Electronically Signed   By: Narda Rutherford M.D.   On: 12/18/2018 00:20   Dg Chest Port 1 View  Result Date: 12/17/2018 CLINICAL DATA:  Endotracheal tube EXAM: PORTABLE CHEST 1 VIEW COMPARISON:  Yesterday FINDINGS: Endotracheal tube tip 12 mm above the carina. The orogastric tube tip is at the pylorus. Generalized interstitial coarsening with mild atelectasis at the bases. Right apex is excluded from view. No evident pneumothorax. Normal heart size. IMPRESSION: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

## 2019-01-13 NOTE — Discharge Instructions (Signed)
Dysphagia Eating Plan, Pureed °This diet is helpful for people with moderate to severe swallowing problems. Pureed foods are smooth and are prepared without lumps so that they can be swallowed safely. Work with your health care provider and your diet and nutrition specialist (dietitian) to make sure that you are following the diet safely and getting all the nutrients you need. °What are tips for following this plan? °General instructions °· You may eat foods that are soft and have a pudding-like texture. °· Do not eat foods that you have to chew. If you have to chew the food, then you cannot eat it. °· Avoid foods that are hard, dry, sticky, chunky, lumpy, or stringy. Also avoid foods with nuts, seeds, raisins, skins, or pulp. °· You may be instructed to thicken liquids. Follow your health care provider's instructions about how to do this and to what consistency. °Cooking ° °· If a food is not originally a smooth texture, you may be able to eat the food after: °? Pureeing it. This can be done with a blender. °? Moistening it. This can be done by adding juice, cooking liquid, gravy, or sauce to a dry food and then pureeing it. For example, you may have bread if you soak it in milk and puree it. °· If a food is too thin, you may add a commercial thickener, corn starch, rice cereal, or potato flakes to thicken it. °· Strain and throw away any liquid that separates from a solid pureed food before eating. °· Strain lumps, chunks, pulp, and seeds from pureed foods before eating. °· Reheat foods slowly to prevent a tough crust from forming. °Meal planning °· Eat a variety of foods to get all the nutrients you need. °· Add dry milk or protein powder to food to increase calories and protein content. °· Follow your meal plan as told by your dietitian. °What foods are allowed? °The items listed may not be a complete list. Talk with your dietitian about what dietary choices are best for you. °Grains °Soft breads, pancakes,  Sadek toast, muffins, and bread stuffing pureed to a smooth, moist texture, without nuts or seeds. Cooked cereals that have a pudding-like consistency, such as cream of wheat or farina. Pureed oatmeal. Pureed, well-cooked pasta and rice. °Vegetables °Pureed vegetables. Smooth tomato paste or sauce. Mashed or pureed potatoes without skin. °Fruits °Pureed fruits such as melons and apples without seeds or pulp. Mashed bananas. Mashed avocado. Fruit juices without pulp or seeds. °Meats and other protein foods °Pureed meat, poultry, and fish. Smooth pate or liverwurst. Smooth souffles. Pureed beans (such as lentils). Pureed eggs. Smooth nut and seed butters. Pureed tofu. °Dairy °Yogurt. Milk. Pureed cottage cheese. Nutritional dairy drinks or shakes. Cream cheese. Smooth pudding, ice cream, sherbet, and malts. °Fats and oils °Butter. Margarine. Vegetable oils. Smooth and strained gravy. Sour cream. Mayonnaise. Smooth sauces such as white sauce, cheese sauce, or hollandaise sauce. °Sweets and desserts °Moistened and pureed cookies and cakes. Whipped topping. Gelatin. Pudding pops. °Seasoning and other foods °Finely ground spices. Jelly. Honey. Pureed casseroles. Strained soups. Pureed sandwiches. °Beverages °Anything prepared at the consistency recommended by your dietitian. °What foods are not allowed? °The items listed may not be a complete list. Talk with your dietitian about what dietary choices are best for you. °Grains °Oatmeal. Dry cereals. Hard breads. Breads with seeds or nuts. Whole pasta, rice, or other grains. Whole pancakes, waffles, biscuits, muffins, or rolls. °Vegetables °Whole vegetables. Stringy vegetables (such as celery). Tomatoes or tomato   sauce with seeds. Fried vegetables. Fruits Whole fresh, frozen, canned, or dried fruits that have not been pureed. Stringy fruits, such as pineapple or coconut. Watermelon with seeds. Dried fruit or fruit leather. Meat and other protein foods Whole or ground  meat, fish, or poultry. Dried or cooked lentils or legumes that have been cooked but not mashed or pureed. Non-pureed eggs. Nuts and seeds. Crunchy peanut butter. Whole tofu or other meat alternatives. Dairy Cheese cubes or slices. Non-pureed cottage cheese. Yogurt with fruit chunks. Fats and oils All fats and sauces that have lumps or chunks. Sweets and desserts Solid desserts. Sticky, chewy sweets (such as licorice and caramel). Candy with nuts or coconut. Seasoning and other foods Coarse or seeded herbs and spices. Chunky preserves. Jams with seeds. Whole sandwiches. Non-pureed casseroles. Chunky soups. Summary  Pureed foods can be helpful for people with moderate to severe swallowing problems.  On the dysphagia eating plan, you may eat foods that are soft and have a pudding-like texture. You should avoid foods that you have to chew. If you have to chew the food, then you cannot eat it.  You may be instructed to thicken liquids. Follow your health care provider's instructions about how to do this and to what consistency. This information is not intended to replace advice given to you by your health care provider. Make sure you discuss any questions you have with your health care provider. Document Released: 11/09/2005 Document Revised: 01/12/2017 Document Reviewed: 01/12/2017 Elsevier Interactive Patient Education  2019 [employer] Aspiration Pneumonia Aspiration pneumonia is an infection in the lungs. It occurs when saliva or liquid contaminated with bacteria is inhaled (aspirated) into the lungs. When these things get into the lungs, swelling (inflammation) and infection can occur. This can make it difficult to breathe. Aspiration pneumonia is a serious condition and can be life threatening. What are the causes? This condition is caused when saliva or liquid from the mouth, throat, or stomach is inhaled into the lungs, and when those fluids are contaminated with bacteria. What  increases the risk? The following factors may make you more likely to develop this condition:  A narrowing of the tube that carries food to the stomach (esophageal narrowing).  Having gastroesophageal reflux disease (GERD).  Having a weak immune system.  Having diabetes.  Having poor oral hygiene.  Being malnourished. The condition is more likely to occur when a person's cough (gag) reflex, or ability to swallow, has decreased. Some things that can cause this decrease include:  Having a brain injury or disease, such as stroke, seizures, Parkinson disease, dementia, or amyotrophic lateral sclerosis (ALS).  Being given a general anesthetic for procedures.  Drinking too much alcohol. If a person passes out and vomits, vomit can be inhaled into the lungs.  Taking certain medicines, such as tranquilizers or sedatives. What are the signs or symptoms? Symptoms of this condition include:  Fever.  A cough with secretions that are yellow, tan, or green.  Breathing problems, such as wheezing or shortness of breath.  Chest pain.  Being more tired than usual (fatigue).  Having a history of coughing while eating or drinking.  Bad breath.  Bluish color to the lips, skin, or fingers. How is this diagnosed? This condition may be diagnosed based on:  A physical exam.  Tests, such as: ? Chest X-ray. ? Sputum culture. Saliva and mucus (sputum) are collected from the lungs or the tubes that carry air to the lungs (bronchi). The sputum is then tested for  bacteria. ? Oximetry. A sensor or clip is placed on areas such as a finger, earlobe, or toe to measure the oxygen level in your blood. ? Blood tests. ? Swallowing study. This test looks at how food is swallowed and whether it goes into your breathing tube (trachea) or esophagus. ? Bronchoscopy. This test uses a flexible tube (bronchoscope) to see inside the lungs. How is this treated? This condition may be treated with:  Medicines.  Antibiotic medicine will be given to kill the pneumonia bacteria. Other medicines may also be used to reduce fever or pain.  Breathing assistance and oxygen therapy. Depending on how well you are breathing, you may need to be given oxygen, or you may need breathing support from a breathing machine (ventilator).  Thoracentesis. This is a procedure to remove fluid that has built up in the space between the linings of the chest wall and the lungs.  Feeding tube and diet change. For people who have difficulty swallowing, a feeding tube might be placed in the stomach, or they may be asked to avoid certain food textures or liquids when eating. Follow these instructions at home: Medicines  Take over-the-counter and prescription medicines only as told by your health care provider. ? If you were prescribed an antibiotic medicine, take it as told by your health care provider. Do not stop taking the antibiotic even if you start to feel better. ? Take cough medicine only if you are losing sleep. Cough medicine can prevent your bodys natural ability to remove mucus from your lungs. General instructions  Carefully follow any eating instructions you were given, such as avoiding certain food textures or thickening your liquids. Thickening liquids reduces the risk of developing aspiration pneumonia again.  Use breathing exercises such as postural drainage, deep breathing, and incentive spirometry to help expel secretions.  Rest as instructed by your health care provider.  Sleep in a semi-upright position at night. Try to sleep in a reclining chair, or place a few pillows under your head.  Do not use any products that contain nicotine or tobacco, such as cigarettes and e-cigarettes. If you need help quitting, ask your health care provider.  Keep all follow-up visits as told by your health care provider. This is important. Contact a health care provider if:  You have a fever.  You have a worsening cough  with yellow, tan, or green secretions.  You have coughing while eating or drinking. Get help right away if:  You have worsening shortness of breath, wheezing, or difficulty breathing.  You have chest pain. Summary  Aspiration pneumonia is an infection in the lungs. It is caused when saliva or liquid from the mouth, throat, or stomach is inhaled into the lungs.  Aspiration pneumonia is more likely to occur when a person's cough reflex or ability to swallow has decreased.  Symptoms of aspiration pneumonia include coughing, breathing problems, fever, and chest pain.  Aspiration pneumonia may be treated with antibiotic medicine, other medicines to reduce pain or fever, and breathing assistance or oxygen therapy. This information is not intended to replace advice given to you by your health care provider. Make sure you discuss any questions you have with your health care provider. Document Released: 09/06/2009 Document Revised: 12/15/2016 Document Reviewed: 12/15/2016 Elsevier Interactive Patient Education  2019 [employer].

## 2019-02-16 ENCOUNTER — Other Ambulatory Visit: Payer: Self-pay

## 2019-02-16 ENCOUNTER — Encounter (HOSPITAL_COMMUNITY): Payer: Self-pay | Admitting: Emergency Medicine

## 2019-02-16 ENCOUNTER — Inpatient Hospital Stay (HOSPITAL_COMMUNITY)
Admission: EM | Admit: 2019-02-16 | Discharge: 2019-03-07 | DRG: 870 | Disposition: A | Discharge status: Home Health | Payer: Medicare Other | Attending: Family Medicine | Admitting: Family Medicine

## 2019-02-16 ENCOUNTER — Emergency Department (HOSPITAL_COMMUNITY): Payer: Medicare Other

## 2019-02-16 DIAGNOSIS — Z818 Family history of other mental and behavioral disorders: Secondary | ICD-10-CM

## 2019-02-16 DIAGNOSIS — J849 Interstitial pulmonary disease, unspecified: Secondary | ICD-10-CM

## 2019-02-16 DIAGNOSIS — Z20828 Contact with and (suspected) exposure to other viral communicable diseases: Secondary | ICD-10-CM | POA: Diagnosis present

## 2019-02-16 DIAGNOSIS — Z803 Family history of malignant neoplasm of breast: Secondary | ICD-10-CM | POA: Diagnosis not present

## 2019-02-16 DIAGNOSIS — R042 Hemoptysis: Secondary | ICD-10-CM | POA: Diagnosis present

## 2019-02-16 DIAGNOSIS — Z7951 Long term (current) use of inhaled steroids: Secondary | ICD-10-CM

## 2019-02-16 DIAGNOSIS — Z8701 Personal history of pneumonia (recurrent): Secondary | ICD-10-CM

## 2019-02-16 DIAGNOSIS — Y848 Other medical procedures as the cause of abnormal reaction of the patient, or of later complication, without mention of misadventure at the time of the procedure: Secondary | ICD-10-CM | POA: Diagnosis not present

## 2019-02-16 DIAGNOSIS — J9622 Acute and chronic respiratory failure with hypercapnia: Secondary | ICD-10-CM | POA: Diagnosis present

## 2019-02-16 DIAGNOSIS — Z781 Physical restraint status: Secondary | ICD-10-CM

## 2019-02-16 DIAGNOSIS — J9600 Acute respiratory failure, unspecified whether with hypoxia or hypercapnia: Secondary | ICD-10-CM

## 2019-02-16 DIAGNOSIS — Z87891 Personal history of nicotine dependence: Secondary | ICD-10-CM | POA: Diagnosis not present

## 2019-02-16 DIAGNOSIS — Z8711 Personal history of peptic ulcer disease: Secondary | ICD-10-CM | POA: Diagnosis not present

## 2019-02-16 DIAGNOSIS — E876 Hypokalemia: Secondary | ICD-10-CM | POA: Diagnosis present

## 2019-02-16 DIAGNOSIS — J439 Emphysema, unspecified: Secondary | ICD-10-CM | POA: Diagnosis present

## 2019-02-16 DIAGNOSIS — I10 Essential (primary) hypertension: Secondary | ICD-10-CM | POA: Diagnosis not present

## 2019-02-16 DIAGNOSIS — I471 Supraventricular tachycardia: Secondary | ICD-10-CM | POA: Diagnosis not present

## 2019-02-16 DIAGNOSIS — Z79899 Other long term (current) drug therapy: Secondary | ICD-10-CM

## 2019-02-16 DIAGNOSIS — F419 Anxiety disorder, unspecified: Secondary | ICD-10-CM | POA: Diagnosis not present

## 2019-02-16 DIAGNOSIS — Z881 Allergy status to other antibiotic agents status: Secondary | ICD-10-CM

## 2019-02-16 DIAGNOSIS — I050 Rheumatic mitral stenosis: Secondary | ICD-10-CM | POA: Diagnosis present

## 2019-02-16 DIAGNOSIS — A419 Sepsis, unspecified organism: Secondary | ICD-10-CM | POA: Diagnosis present

## 2019-02-16 DIAGNOSIS — J9621 Acute and chronic respiratory failure with hypoxia: Secondary | ICD-10-CM | POA: Diagnosis present

## 2019-02-16 DIAGNOSIS — Y95 Nosocomial condition: Secondary | ICD-10-CM | POA: Diagnosis present

## 2019-02-16 DIAGNOSIS — Z8249 Family history of ischemic heart disease and other diseases of the circulatory system: Secondary | ICD-10-CM | POA: Diagnosis not present

## 2019-02-16 DIAGNOSIS — J95851 Ventilator associated pneumonia: Secondary | ICD-10-CM | POA: Diagnosis not present

## 2019-02-16 DIAGNOSIS — R0602 Shortness of breath: Secondary | ICD-10-CM

## 2019-02-16 DIAGNOSIS — K7460 Unspecified cirrhosis of liver: Secondary | ICD-10-CM | POA: Diagnosis present

## 2019-02-16 DIAGNOSIS — I110 Hypertensive heart disease with heart failure: Secondary | ICD-10-CM | POA: Diagnosis present

## 2019-02-16 DIAGNOSIS — J9601 Acute respiratory failure with hypoxia: Secondary | ICD-10-CM | POA: Diagnosis not present

## 2019-02-16 DIAGNOSIS — Z4659 Encounter for fitting and adjustment of other gastrointestinal appliance and device: Secondary | ICD-10-CM

## 2019-02-16 DIAGNOSIS — J9690 Respiratory failure, unspecified, unspecified whether with hypoxia or hypercapnia: Secondary | ICD-10-CM

## 2019-02-16 DIAGNOSIS — Z825 Family history of asthma and other chronic lower respiratory diseases: Secondary | ICD-10-CM

## 2019-02-16 DIAGNOSIS — Z978 Presence of other specified devices: Secondary | ICD-10-CM

## 2019-02-16 DIAGNOSIS — E785 Hyperlipidemia, unspecified: Secondary | ICD-10-CM | POA: Diagnosis not present

## 2019-02-16 DIAGNOSIS — F17210 Nicotine dependence, cigarettes, uncomplicated: Secondary | ICD-10-CM | POA: Diagnosis present

## 2019-02-16 DIAGNOSIS — G4700 Insomnia, unspecified: Secondary | ICD-10-CM | POA: Diagnosis not present

## 2019-02-16 DIAGNOSIS — J189 Pneumonia, unspecified organism: Secondary | ICD-10-CM | POA: Diagnosis present

## 2019-02-16 DIAGNOSIS — Y92230 Patient room in hospital as the place of occurrence of the external cause: Secondary | ICD-10-CM | POA: Diagnosis not present

## 2019-02-16 DIAGNOSIS — Z452 Encounter for adjustment and management of vascular access device: Secondary | ICD-10-CM

## 2019-02-16 DIAGNOSIS — J441 Chronic obstructive pulmonary disease with (acute) exacerbation: Secondary | ICD-10-CM | POA: Diagnosis not present

## 2019-02-16 DIAGNOSIS — F1011 Alcohol abuse, in remission: Secondary | ICD-10-CM | POA: Diagnosis present

## 2019-02-16 DIAGNOSIS — M1990 Unspecified osteoarthritis, unspecified site: Secondary | ICD-10-CM | POA: Diagnosis present

## 2019-02-16 DIAGNOSIS — R069 Unspecified abnormalities of breathing: Secondary | ICD-10-CM

## 2019-02-16 DIAGNOSIS — T4275XA Adverse effect of unspecified antiepileptic and sedative-hypnotic drugs, initial encounter: Secondary | ICD-10-CM | POA: Diagnosis not present

## 2019-02-16 DIAGNOSIS — D638 Anemia in other chronic diseases classified elsewhere: Secondary | ICD-10-CM | POA: Diagnosis present

## 2019-02-16 DIAGNOSIS — Z7982 Long term (current) use of aspirin: Secondary | ICD-10-CM

## 2019-02-16 DIAGNOSIS — F17200 Nicotine dependence, unspecified, uncomplicated: Secondary | ICD-10-CM | POA: Diagnosis not present

## 2019-02-16 DIAGNOSIS — I952 Hypotension due to drugs: Secondary | ICD-10-CM | POA: Diagnosis not present

## 2019-02-16 DIAGNOSIS — R451 Restlessness and agitation: Secondary | ICD-10-CM | POA: Diagnosis not present

## 2019-02-16 DIAGNOSIS — I739 Peripheral vascular disease, unspecified: Secondary | ICD-10-CM | POA: Diagnosis present

## 2019-02-16 DIAGNOSIS — I5033 Acute on chronic diastolic (congestive) heart failure: Secondary | ICD-10-CM | POA: Diagnosis not present

## 2019-02-16 DIAGNOSIS — F3130 Bipolar disorder, current episode depressed, mild or moderate severity, unspecified: Secondary | ICD-10-CM | POA: Diagnosis not present

## 2019-02-16 DIAGNOSIS — F319 Bipolar disorder, unspecified: Secondary | ICD-10-CM | POA: Diagnosis present

## 2019-02-16 DIAGNOSIS — R52 Pain, unspecified: Secondary | ICD-10-CM

## 2019-02-16 DIAGNOSIS — K219 Gastro-esophageal reflux disease without esophagitis: Secondary | ICD-10-CM | POA: Diagnosis present

## 2019-02-16 DIAGNOSIS — E7800 Pure hypercholesterolemia, unspecified: Secondary | ICD-10-CM | POA: Diagnosis present

## 2019-02-16 DIAGNOSIS — R509 Fever, unspecified: Secondary | ICD-10-CM | POA: Diagnosis present

## 2019-02-16 DIAGNOSIS — Z79891 Long term (current) use of opiate analgesic: Secondary | ICD-10-CM

## 2019-02-16 DIAGNOSIS — R0902 Hypoxemia: Secondary | ICD-10-CM

## 2019-02-16 DIAGNOSIS — Z888 Allergy status to other drugs, medicaments and biological substances status: Secondary | ICD-10-CM

## 2019-02-16 LAB — COMPREHENSIVE METABOLIC PANEL
ALT: 130 U/L — ABNORMAL HIGH (ref 0–44)
AST: 182 U/L — ABNORMAL HIGH (ref 15–41)
Albumin: 2.8 g/dL — ABNORMAL LOW (ref 3.5–5.0)
Alkaline Phosphatase: 420 U/L — ABNORMAL HIGH (ref 38–126)
Anion gap: 15 (ref 5–15)
BUN: 17 mg/dL (ref 6–20)
CO2: 23 mmol/L (ref 22–32)
Calcium: 8.9 mg/dL (ref 8.9–10.3)
Chloride: 97 mmol/L — ABNORMAL LOW (ref 98–111)
Creatinine, Ser: 0.78 mg/dL (ref 0.61–1.24)
GFR calc Af Amer: >60 mL/min (ref >60)
GFR calc non Af Amer: >60 mL/min (ref >60)
Glucose, Bld: 88 mg/dL (ref 70–99)
Potassium: 4.6 mmol/L (ref 3.5–5.1)
Sodium: 135 mmol/L (ref 135–145)
Total Bilirubin: 0.6 mg/dL (ref 0.3–1.2)
Total Protein: 7.5 g/dL (ref 6.5–8.1)

## 2019-02-16 LAB — RESPIRATORY PANEL BY PCR

## 2019-02-16 LAB — ETHANOL: Alcohol, Ethyl (B): <10 mg/dL (ref <10)

## 2019-02-16 LAB — CBC WITH DIFFERENTIAL/PLATELET
Abs Immature Granulocytes: 0.19 10*3/uL — ABNORMAL HIGH (ref 0.00–0.07)
Basophils Absolute: 0.1 10*3/uL (ref 0.0–0.1)
Basophils Relative: 1 %
Eosinophils Absolute: 0 10*3/uL (ref 0.0–0.5)
Eosinophils Relative: 0 %
HCT: 38.8 % — ABNORMAL LOW (ref 39.0–52.0)
Hemoglobin: 12.1 g/dL — ABNORMAL LOW (ref 13.0–17.0)
Immature Granulocytes: 2 %
Lymphocytes Relative: 9 %
Lymphs Abs: 1 10*3/uL (ref 0.7–4.0)
MCH: 26.8 pg (ref 26.0–34.0)
MCHC: 31.2 g/dL (ref 30.0–36.0)
MCV: 86 fL (ref 80.0–100.0)
Monocytes Absolute: 0.3 10*3/uL (ref 0.1–1.0)
Monocytes Relative: 3 %
Neutro Abs: 10.6 10*3/uL — ABNORMAL HIGH (ref 1.7–7.7)
Neutrophils Relative %: 85 %
Platelets: 691 10*3/uL — ABNORMAL HIGH (ref 150–400)
RBC: 4.51 MIL/uL (ref 4.22–5.81)
RDW: 13.8 % (ref 11.5–15.5)
WBC: 12.3 10*3/uL — ABNORMAL HIGH (ref 4.0–10.5)
nRBC: 0.7 % — ABNORMAL HIGH (ref 0.0–0.2)

## 2019-02-16 LAB — PROCALCITONIN: Procalcitonin: 0.19 ng/mL

## 2019-02-16 LAB — LIPASE, BLOOD: Lipase: 17 U/L (ref 11–51)

## 2019-02-16 LAB — LACTIC ACID, PLASMA
Lactic Acid, Venous: 2.6 mmol/L (ref 0.5–1.9)
Lactic Acid, Venous: 3.4 mmol/L (ref 0.5–1.9)
Lactic Acid, Venous: 3.9 mmol/L (ref 0.5–1.9)

## 2019-02-16 LAB — URINALYSIS, ROUTINE W REFLEX MICROSCOPIC
Bilirubin Urine: NEGATIVE
Glucose, UA: NEGATIVE mg/dL
Hgb urine dipstick: NEGATIVE
Ketones, ur: NEGATIVE mg/dL
Leukocytes,Ua: NEGATIVE
Nitrite: NEGATIVE
Protein, ur: NEGATIVE mg/dL
Specific Gravity, Urine: 1.013 (ref 1.005–1.030)
pH: 6 (ref 5.0–8.0)

## 2019-02-16 LAB — INFLUENZA PANEL BY PCR (TYPE A & B)
Influenza A By PCR: NEGATIVE
Influenza B By PCR: NEGATIVE

## 2019-02-16 LAB — APTT: aPTT: 34 seconds (ref 24–36)

## 2019-02-16 LAB — LACTATE DEHYDROGENASE: LDH: 334 U/L — ABNORMAL HIGH (ref 98–192)

## 2019-02-16 LAB — PROTIME-INR
INR: 1.2 (ref 0.8–1.2)
Prothrombin Time: 14.9 seconds (ref 11.4–15.2)

## 2019-02-16 MED ORDER — IPRATROPIUM BROMIDE HFA 17 MCG/ACT IN AERS
2.0000 | INHALATION_SPRAY | RESPIRATORY_TRACT | Status: DC
  Administered 2019-02-17 (×5): 2 via RESPIRATORY_TRACT
  Filled 2019-02-16: qty 12.9

## 2019-02-16 MED ORDER — ALBUTEROL (5 MG/ML) CONTINUOUS INHALATION SOLN
10.0000 mg/h | INHALATION_SOLUTION | RESPIRATORY_TRACT | Status: DC
  Administered 2019-02-16: 10 mg/h via RESPIRATORY_TRACT
  Filled 2019-02-16: qty 20

## 2019-02-16 MED ORDER — HYDROCOD POLST-CPM POLST ER 10-8 MG/5ML PO SUER: 5.0000 mL | Freq: Two times a day (BID) | ORAL | Status: DC | PRN

## 2019-02-16 MED ORDER — VANCOMYCIN HCL IN DEXTROSE 750-5 MG/150ML-% IV SOLN
750.0000 mg | Freq: Three times a day (TID) | INTRAVENOUS | Status: DC
  Administered 2019-02-16 – 2019-02-17 (×2): 750 mg via INTRAVENOUS
  Filled 2019-02-16 (×3): qty 150

## 2019-02-16 MED ORDER — SODIUM CHLORIDE 0.9 % IV BOLUS
1000.0000 mL | Freq: Once | INTRAVENOUS | Status: AC
  Administered 2019-02-16: 1000 mL via INTRAVENOUS

## 2019-02-16 MED ORDER — FOLIC ACID 1 MG PO TABS
1.0000 mg | ORAL_TABLET | Freq: Every day | ORAL | Status: DC
  Filled 2019-02-16: qty 1

## 2019-02-16 MED ORDER — ACETAMINOPHEN 325 MG PO TABS: 650.0000 mg | ORAL_TABLET | Freq: Four times a day (QID) | ORAL | Status: DC | PRN

## 2019-02-16 MED ORDER — SODIUM CHLORIDE 0.9 % IV BOLUS (SEPSIS)
1000.0000 mL | Freq: Once | INTRAVENOUS | Status: AC
  Administered 2019-02-16: 1000 mL via INTRAVENOUS

## 2019-02-16 MED ORDER — HYDROCODONE-ACETAMINOPHEN 5-325 MG PO TABS
1.0000 | ORAL_TABLET | Freq: Four times a day (QID) | ORAL | Status: DC | PRN
  Administered 2019-02-17 (×2): 1 via ORAL
  Filled 2019-02-16 (×2): qty 1

## 2019-02-16 MED ORDER — SODIUM CHLORIDE 0.9 % IV SOLN
INTRAVENOUS | Status: DC
  Administered 2019-02-16 (×2): via INTRAVENOUS

## 2019-02-16 MED ORDER — SODIUM CHLORIDE 0.9 % IV SOLN
1.0000 g | Freq: Three times a day (TID) | INTRAVENOUS | Status: DC
  Administered 2019-02-16 – 2019-02-17 (×2): 1 g via INTRAVENOUS
  Filled 2019-02-16 (×3): qty 1

## 2019-02-16 MED ORDER — ADULT MULTIVITAMIN W/MINERALS CH
1.0000 | ORAL_TABLET | Freq: Every day | ORAL | Status: DC
  Filled 2019-02-16: qty 1

## 2019-02-16 MED ORDER — SODIUM CHLORIDE 0.9 % IV SOLN
INTRAVENOUS | Status: DC
  Administered 2019-02-17 – 2019-02-23 (×9): via INTRAVENOUS

## 2019-02-16 MED ORDER — LORAZEPAM 1 MG PO TABS
1.0000 mg | ORAL_TABLET | Freq: Four times a day (QID) | ORAL | Status: DC | PRN
  Administered 2019-02-17: 1 mg via ORAL
  Filled 2019-02-16: qty 1

## 2019-02-16 MED ORDER — PANTOPRAZOLE SODIUM 20 MG PO TBEC
20.0000 mg | DELAYED_RELEASE_TABLET | Freq: Every day | ORAL | Status: DC
  Administered 2019-02-17 (×2): 20 mg via ORAL
  Filled 2019-02-16 (×2): qty 1

## 2019-02-16 MED ORDER — GUAIFENESIN-DM 100-10 MG/5ML PO SYRP
10.0000 mL | ORAL_SOLUTION | ORAL | Status: DC | PRN
  Administered 2019-02-17 (×2): 10 mL via ORAL
  Filled 2019-02-16 (×2): qty 10

## 2019-02-16 MED ORDER — FLUTICASONE FUROATE-VILANTEROL 200-25 MCG/INH IN AEPB
1.0000 | INHALATION_SPRAY | Freq: Every day | RESPIRATORY_TRACT | Status: DC
  Administered 2019-02-17: 1 via RESPIRATORY_TRACT
  Filled 2019-02-16: qty 28

## 2019-02-16 MED ORDER — VANCOMYCIN HCL IN DEXTROSE 1-5 GM/200ML-% IV SOLN
1000.0000 mg | Freq: Once | INTRAVENOUS | Status: AC
  Administered 2019-02-16: 1000 mg via INTRAVENOUS
  Filled 2019-02-16: qty 200

## 2019-02-16 MED ORDER — ONDANSETRON HCL 4 MG/2ML IJ SOLN: 4.0000 mg | Freq: Four times a day (QID) | INTRAMUSCULAR | Status: DC | PRN

## 2019-02-16 MED ORDER — OLANZAPINE 5 MG PO TABS
15.0000 mg | ORAL_TABLET | Freq: Every day | ORAL | Status: DC
  Administered 2019-02-17: 15 mg via ORAL
  Filled 2019-02-16 (×2): qty 1

## 2019-02-16 MED ORDER — VITAMIN B-1 100 MG PO TABS
100.0000 mg | ORAL_TABLET | Freq: Every day | ORAL | Status: DC
  Administered 2019-02-17 (×2): 100 mg via ORAL
  Filled 2019-02-16 (×2): qty 1

## 2019-02-16 MED ORDER — METOPROLOL TARTRATE 25 MG PO TABS
50.0000 mg | ORAL_TABLET | Freq: Two times a day (BID) | ORAL | Status: DC
  Administered 2019-02-17 (×2): 50 mg via ORAL
  Filled 2019-02-16 (×2): qty 2

## 2019-02-16 MED ORDER — ALBUTEROL SULFATE HFA 108 (90 BASE) MCG/ACT IN AERS
2.0000 | INHALATION_SPRAY | RESPIRATORY_TRACT | Status: DC
  Administered 2019-02-17 (×5): 2 via RESPIRATORY_TRACT
  Filled 2019-02-16: qty 6.7

## 2019-02-16 MED ORDER — FLUOXETINE HCL 20 MG PO CAPS
40.0000 mg | ORAL_CAPSULE | Freq: Every day | ORAL | Status: DC
  Administered 2019-02-17 (×2): 40 mg via ORAL
  Filled 2019-02-16 (×2): qty 2

## 2019-02-16 MED ORDER — GABAPENTIN 400 MG PO CAPS
400.0000 mg | ORAL_CAPSULE | Freq: Three times a day (TID) | ORAL | Status: DC
  Administered 2019-02-17 (×3): 400 mg via ORAL
  Filled 2019-02-16 (×3): qty 1

## 2019-02-16 MED ORDER — VANCOMYCIN HCL IN DEXTROSE 1-5 GM/200ML-% IV SOLN: 1000.0000 mg | Freq: Once | INTRAVENOUS | Status: DC

## 2019-02-16 MED ORDER — SODIUM CHLORIDE 0.9 % IV SOLN
1.0000 g | Freq: Once | INTRAVENOUS | Status: AC
  Administered 2019-02-16: 1 g via INTRAVENOUS
  Filled 2019-02-16: qty 1

## 2019-02-16 MED ORDER — ATORVASTATIN CALCIUM 10 MG PO TABS
20.0000 mg | ORAL_TABLET | Freq: Every day | ORAL | Status: DC
  Administered 2019-02-17 (×2): 20 mg via ORAL
  Filled 2019-02-16 (×2): qty 2

## 2019-02-16 MED ORDER — ONDANSETRON HCL 4 MG PO TABS: 4.0000 mg | ORAL_TABLET | Freq: Four times a day (QID) | ORAL | Status: DC | PRN

## 2019-02-16 MED ORDER — METHOCARBAMOL 500 MG PO TABS: 500.0000 mg | ORAL_TABLET | Freq: Three times a day (TID) | ORAL | Status: DC | PRN

## 2019-02-16 MED ORDER — SODIUM CHLORIDE 0.9 % IV SOLN: 1.0000 g | Freq: Once | INTRAVENOUS | Status: DC

## 2019-02-16 MED ORDER — LORAZEPAM 2 MG/ML IJ SOLN: 1.0000 mg | Freq: Four times a day (QID) | INTRAMUSCULAR | Status: DC | PRN

## 2019-02-16 MED ORDER — FOLIC ACID 1 MG PO TABS
1.0000 mg | ORAL_TABLET | Freq: Every day | ORAL | Status: DC
  Administered 2019-02-17 (×2): 1 mg via ORAL
  Filled 2019-02-16: qty 1

## 2019-02-16 MED ORDER — ASPIRIN EC 81 MG PO TBEC
81.0000 mg | DELAYED_RELEASE_TABLET | Freq: Every evening | ORAL | Status: DC
  Administered 2019-02-17 (×2): 81 mg via ORAL
  Filled 2019-02-16 (×2): qty 1

## 2019-02-16 MED ORDER — ADULT MULTIVITAMIN W/MINERALS CH
1.0000 | ORAL_TABLET | Freq: Every day | ORAL | Status: DC
  Administered 2019-02-17 (×2): 1 via ORAL
  Filled 2019-02-16: qty 1

## 2019-02-16 MED ORDER — DIVALPROEX SODIUM ER 250 MG PO TB24
1500.0000 mg | ORAL_TABLET | Freq: Every day | ORAL | Status: DC
  Administered 2019-02-17: 1500 mg via ORAL
  Filled 2019-02-16: qty 6

## 2019-02-16 NOTE — ED Notes (Signed)
Placed on 6l o2 via n/c, or came up to 70's.  Placed on NRB at 15 lpm

## 2019-02-16 NOTE — Progress Notes (Signed)
Pharmacy Antibiotic Note  Jeffrey [name] is a 58 y.o. male admitted on 02/16/2019 with pneumonia.  Pharmacy has been consulted for vancomycin and merrem dosing.  Plan: Vancomycin 750mg  IV every 8 hours.  Goal trough 15-20 mcg/mL. merrem 1gm iv q8h  Height: 5\' 10"  (177.8 cm) Weight: 145 lb (65.8 kg) IBW/kg (Calculated) : 73  Temp (24hrs), Avg:101.3 F (38.5 C), Min:101.3 F (38.5 C), Max:101.3 F (38.5 C)  Recent Labs  Lab 02/16/19 1213 02/16/19 1219  WBC  --  12.3*  CREATININE  --  0.78  LATICACIDVEN 2.6*  --     Estimated Creatinine Clearance: 93.7 mL/min (by C-G formula based on SCr of 0.78 mg/dL).    Allergies  Allergen Reactions  . Augmentin [Amoxicillin-Pot Clavulanate] Rash and Other (See Comments)    Has patient had a PCN reaction causing immediate rash, facial/tongue/throat swelling, SOB or lightheadedness with hypotension: No Has patient had a PCN reaction causing severe rash involving mucus membranes or skin necrosis: No Has patient had a PCN reaction that required hospitalization No Has patient had a PCN reaction occurring within the last 10 years: Yes If all of the above answers are "NO", then may proceed with Cephalosporin use.  . Ace Inhibitors Hives    Antimicrobials this admission: 3/26 merrem >>  3/26 vancomycin >>    Microbiology results: 3/26 BCx: sent 3/26 MRSA PCR: sent 3/26 covid 19 sent   Thank you for allowing pharmacy to be a part of this patient's care.  Jeffrey [name] 02/16/2019 1:59 PM

## 2019-02-16 NOTE — H&P (Addendum)
History and Physical  DELANTE KARAPETYAN ZOX:[id number] DOB: 10/28/1961 DOA: 02/16/2019   PCP: Lemmie Evens, MD   Patient coming from: Home  Chief Complaint: dyspnea and cough  HPI:  RENWICK ASMAN is a 58 y.o. male with medical history of alcohol and polysubstance abuse, COPD, bipolar disorder, hypertension, hyperlipidemia,  and chronic diastolic CHF presenting with 1 week history of fevers, chills, coughing, shortness of breath.  The patient was recently discharged from the hospital after a prolonged stay from 12/16/2018 through 01/13/2019.  During that hospital stay, the patient was treated for acute respiratory failure secondary to Klebsiella pneumonia.  He required intubation and mechanical ventilation during that hospitalization.  His prolonged hospitalization was complicated by Warnicke's encephalopathy, hepatic encephalopathy with ammonia at 274.  The patient was subsequently discharged home with home health physical therapy.  Since discharge, the patient states that he has not had any travels nor known Covid contacts.  He states that he has not been out of his house much except to [place] his [place].  He claims that his daughter has been helping him with grocery shopping and other errands.  He is also been seen by his grandson.  The patient states that his daughter and grandson have been healthy without any flulike symptoms.  Nevertheless, the patient stated that he went to see his primary care provider on 02/08/2019 with the symptoms described above.  The patient was prescribed levofloxacin with which he was compliant.  He stated that he had some initial improvement over the first 2 to 3 days, but in the last 2 to 3 days he has had increasing shortness of breath, coughing, wheezing.  The patient denies any headache, sore throat, chest pain, nausea, vomiting, diarrhea, abdominal pain, dysuria, hematuria, hematochezia, melena.  The patient states that he has a small amount of blood-tinged sputum. The  patient states that he has not smoked any cigarettes nor has he drank any alcohol since his last admission to the hospital.  He endorsed compliance with all his medications and inhalers.  Because of worsening symptoms, he presented for further evaluation.  Upon presentation, the patient had temperature 101.3 F and was tachycardic into the 130s.  EMS noted the patient to have oxygen saturation in the low 60s.  He was given Solu-Medrol 125 mg IM.  In the emergency department, the patient was started on vancomycin and meropenem.  He was also given a 1 hour nebulizer treatment with albuterol.  BMP was unremarkable.  WBC was 12.3, hemoglobin 12.1, platelet 691,000.  AST 182, ALT 130, alk phosphatase 420, total bilirubin 0.6, albumin 2.8.  Lactic acid peaked at 3.4.  Blood cultures were obtained.  I have ordered Influenza and viral respiratory panel, and coronavirus NAA.  The patient was placed on a NRB with oxygen saturation 95-97% and he was able to speak in full sentences.  Assessment/Plan: Sepsis -present at time of admission -secondary to pneumonia -Lactic acid peaked 3.4 -check procalcitonin -start empiric vanco and merrem -follow blood cultures -continue IVF  HCAP -Check influenza PCR -Viral respiratory panel -Check COVID NAA -Check LDH -Check procalcitonin -MRSA screen -personally reviewed CXR--bilateral interstitial infiltrates  Transaminasemia -likely due to sepsis -check alcohol level -no abdominal pain -daily LFTs  COPD -mild wheezing on exam -start albuteral MDI -start atrovent MDI -start budesonide MDI -try to avoid steroids for now if possible pending clinical course  Alcohol abuse, in remission -Check alcohol level -Patient states that he has not had any alcohol since  his admission in January 2020 -continue thiamine  Tobacco abuse, in remission -Patient states that he has not smoked since his admission in January 2020  Bipolar disorder -Continue fluoxetine and  olanzapine -Continue valproic acid  Hyperlipidemia -Continue statin  Essential hypertension -Continue metoprolol tartrate        Past Medical History:  Diagnosis Date  . Acute blood loss anemia 02/07/2017  . Anxiety   . Arthritis    deg disease, bulging disk,  shoulder level  . Bipolar disorder ([place])   . Bipolar disorder ([place])   . COPD, severe ([place]) 10/09/2016  . Depression    anxiety  . Hyperlipidemia   . Hypertension   . Peptic ulcer disease    Review  . Pneumonia   . [name] (peripheral vascular disease) ([place]) 06/18/2015  . Shortness of breath    Past Surgical History:  Procedure Laterality Date  . BIOPSY  02/09/2017   Procedure: BIOPSY;  Surgeon: Danie Binder, MD;  Location: [facility];  Service: Endoscopy;;  duodenal gastric  . COLONOSCOPY  03/14/2007   ZOX:[id number] colonoscopy and terminal ileoscopy except external hemorrhoids  . COLONOSCOPY N/A 05/05/2013   Dr. Gala Romney: external/internal anal canal hemorrhoids, unable to intubate TI, segemental biopsies unremarkable   . COLONOSCOPY N/A 04/11/2017   Procedure: COLONOSCOPY;  Surgeon: Danie Binder, MD;  Location: [facility];  Service: Endoscopy;  Laterality: N/A;  . COLONOSCOPY WITH PROPOFOL N/A 03/31/2017   Procedure: COLONOSCOPY WITH PROPOFOL;  Surgeon: Daneil Dolin, MD;  Location: [facility];  Service: Endoscopy;  Laterality: N/A;  1:45pm  . ESOPHAGOGASTRODUODENOSCOPY  03/14/2007   [id number]:[id number] antral gastritis with bulbar duodenitis/paucity to postbulbar duodenal folds and biopsy were benign with no evidence of villous atrophy.  . ESOPHAGOGASTRODUODENOSCOPY (EGD) WITH PROPOFOL N/A 02/09/2017   Procedure: ESOPHAGOGASTRODUODENOSCOPY (EGD) WITH PROPOFOL;  Surgeon: Danie Binder, MD;  [facility];  Service: Endoscopy;  Laterality: N/A;  . GIVENS CAPSULE STUDY N/A 04/08/2017   Procedure: GIVENS CAPSULE STUDY;  Surgeon: Daneil Dolin, MD;  Location: [facility];  Service: Endoscopy;   Laterality: N/A;  . HAND SURGERY     left, secondary to self-inflicted laceration  . HEMORRHOID SURGERY N/A 04/14/2017   Procedure: HEMORRHOIDECTOMY;  Surgeon: Aviva Signs, MD;  Location: [facility];  Service: General;  Laterality: N/A;  . SHOULDER SURGERY     right  . TOE SURGERY     left great toe , amputated-lawnmover accident   Social History:  reports that he has been smoking cigarettes. He started smoking about 47 years ago. He has a 40.00 pack-year smoking history. He has never used smokeless tobacco. He reports current drug use. Drug: Marijuana. He reports that he does not drink alcohol.   Family History  Problem Relation Age of Onset  . Breast cancer Mother        deceased  . Heart disease Father   . Depression Daughter   . Anxiety disorder Daughter   . Anxiety disorder Son   . Depression Son   . Asthma Brother   . Heart attack Maternal Aunt   . Heart attack Maternal Uncle   . Heart attack Paternal Aunt   . Heart attack Paternal Uncle   . Heart attack Maternal Grandmother   . Heart attack Maternal Grandfather   . Emphysema Maternal Grandfather   . Heart attack Paternal Grandmother   . Heart attack Paternal Grandfather   . Colon cancer Neg Hx   . Liver disease Neg  Hx      Allergies  Allergen Reactions  . Augmentin [Amoxicillin-Pot Clavulanate] Rash and Other (See Comments)    Has patient had a PCN reaction causing immediate rash, facial/tongue/throat swelling, SOB or lightheadedness with hypotension: No Has patient had a PCN reaction causing severe rash involving mucus membranes or skin necrosis: No Has patient had a PCN reaction that required hospitalization No Has patient had a PCN reaction occurring within the last 10 years: Yes If all of the above answers are "NO", then may proceed with Cephalosporin use.  . Ace Inhibitors Hives     Prior to Admission medications   Medication Sig Start Date End Date Taking? Authorizing Provider  albuterol (PROAIR HFA) 108  (90 Base) MCG/ACT inhaler Inhale 2 puffs into the lungs every 4 (four) hours as needed for wheezing or shortness of breath. 01/13/19   Mikhail, Velta Addison, DO  albuterol (PROVENTIL) (2.5 MG/3ML) 0.083% nebulizer solution Take 3 mLs (2.5 mg total) by nebulization every 4 (four) hours as needed for shortness of breath. 01/13/19   Cristal Ford, DO  aspirin EC 81 MG tablet Take 81 mg by mouth every evening.     [provider]  atorvastatin (LIPITOR) 20 MG tablet Take 1 tablet (20 mg total) by mouth daily. For high cholesterol 02/09/18   Money, Lowry Ram, FNP  budesonide-formoterol (SYMBICORT) 160-4.5 MCG/ACT inhaler Inhale 2 puffs into the lungs 2 (two) times daily.    [provider]  chlorpheniramine-HYDROcodone (TUSSIONEX PENNKINETIC ER) 10-8 MG/5ML SUER Take 5 mLs by mouth at bedtime as needed for cough. 01/13/19   Mikhail, Velta Addison, DO  divalproex (DEPAKOTE ER) 500 MG 24 hr tablet Take 3 tablets (1,500 mg total) by mouth at bedtime. 11/28/18   Cloria Spring, MD  FLUoxetine (PROZAC) 40 MG capsule Take 1 capsule (40 mg total) by mouth daily. For mood control 11/28/18   Cloria Spring, MD  folic acid (FOLVITE) 1 MG tablet Take 1 tablet (1 mg total) by mouth daily. 01/13/19 01/13/20  Cristal Ford, DO  furosemide (LASIX) 20 MG tablet Take 1 tablet (20 mg total) by mouth daily. 06/18/18   Barton Dubois, MD  gabapentin (NEURONTIN) 400 MG capsule Take 1 capsule (400 mg total) by mouth 3 (three) times daily. 11/28/18   Cloria Spring, MD  HYDROcodone-acetaminophen (NORCO/VICODIN) 5-325 MG tablet Take 1 tablet by mouth every 6 (six) hours as needed for moderate pain. 01/13/19 01/13/20  Cristal Ford, DO  ipratropium (ATROVENT) 0.02 % nebulizer solution Take 2.5 mLs (0.5 mg total) by nebulization 3 (three) times daily. 01/13/19   Mikhail, Velta Addison, DO  methocarbamol (ROBAXIN) 500 MG tablet Take 1 tablet (500 mg total) by mouth every 8 (eight) hours as needed for muscle spasms. 01/13/19   Mikhail,  Velta Addison, DO  metoprolol tartrate (LOPRESSOR) 50 MG tablet Take 1 tablet (50 mg total) by mouth 2 (two) times daily. 02/08/18   Money, Lowry Ram, FNP  Multiple Vitamin (MULTIVITAMIN WITH MINERALS) TABS tablet Take 1 tablet by mouth daily. 01/13/19   Mikhail, Velta Addison, DO  OLANZapine (ZYPREXA) 10 MG tablet Take 1.5 tablets (15 mg total) by mouth at bedtime. For mood stability 11/28/18   Cloria Spring, MD  pantoprazole (PROTONIX) 20 MG tablet Take 1 tablet (20 mg total) by mouth daily. 06/17/18 06/17/19  Barton Dubois, MD  potassium chloride SA (K-DUR,KLOR-CON) 20 MEQ tablet Take 1 tablet (20 mEq total) by mouth daily. 06/18/18   Barton Dubois, MD  thiamine (VITAMIN B-1) 100 MG tablet Take 1  tablet (100 mg total) by mouth daily. 01/13/19   Mikhail, Velta Addison, DO  Tiotropium Bromide-Olodaterol (STIOLTO RESPIMAT) 2.5-2.5 MCG/ACT AERS Inhale 2 puffs into the lungs daily. 11/01/18   Collene Gobble, MD  traZODone (DESYREL) 50 MG tablet Take 1 tablet (50 mg total) by mouth at bedtime as needed for sleep. 11/28/18   Cloria Spring, MD    Review of Systems:  Constitutional:  No weight loss, night sweats Head&Eyes: No headache.  No vision loss.  No eye pain or scotoma ENT:  No Difficulty swallowing,Tooth/dental problems,Sore throat,  No ear ache, post nasal drip,  Cardio-vascular:  No chest pain, Orthopnea, PND, swelling in lower extremities,  dizziness, palpitations  GI:  No  abdominal pain, nausea, vomiting, diarrhea, loss of appetite, hematochezia, melena, heartburn, indigestion, Resp:   No wheezing.No chest wall deformity  Skin:  no rash or lesions.  GU:  no dysuria, change in color of urine, no urgency or frequency. No flank pain.  Musculoskeletal:  No joint pain or swelling. No decreased range of motion. No back pain.  Psych:  No change in mood or affect.  Neurologic: No headache, no dysesthesia, no focal weakness, no vision loss. No syncope  Physical Exam: Vitals:   02/16/19 1300 02/16/19  1330 02/16/19 1400 02/16/19 1430  BP: 121/82 (!) 143/77 129/71 115/89  Pulse:  (!) 128 (!) 126 (!) 117  Resp: (!) 31 (!) 34 (!) 34   Temp:      TempSrc:      SpO2:  91% 97% 99%  Weight:      Height:       General:  A&O x 3, NAD, nontoxic, pleasant/cooperative Head/Eye: No conjunctival hemorrhage, no icterus, [place]/AT, No nystagmus ENT:  No icterus,  No thrush, good dentition, no pharyngeal exudate Neck:  No masses, no lymphadenpathy, no bruits CV:  RRR, no rub, no gallop, no S3 Lung:  Minimal bibasilar wheeze; scattered bilateral rales Abdomen: soft/NT, +BS, nondistended, no peritoneal signs Ext: No cyanosis, No rashes, No petechiae, No lymphangitis, No edema Neuro: CNII-XII intact, strength 4/5 in bilateral upper and lower extremities, no dysmetria  Labs on Admission:  Basic Metabolic Panel: Recent Labs  Lab 02/16/19 1219  NA 135  K 4.6  CL 97*  CO2 23  GLUCOSE 88  BUN 17  CREATININE 0.78  CALCIUM 8.9   Liver Function Tests: Recent Labs  Lab 02/16/19 1219  AST 182*  ALT 130*  ALKPHOS 420*  BILITOT 0.6  PROT 7.5  ALBUMIN 2.8*   Recent Labs  Lab 02/16/19 1219  LIPASE 17   No results for input(s): AMMONIA in the last 168 hours. CBC: Recent Labs  Lab 02/16/19 1219  WBC 12.3*  NEUTROABS 10.6*  HGB 12.1*  HCT 38.8*  MCV 86.0  PLT 691*   Coagulation Profile: Recent Labs  Lab 02/16/19 1219  INR 1.2   Cardiac Enzymes: No results for input(s): CKTOTAL, CKMB, CKMBINDEX, TROPONINI in the last 168 hours. BNP: Invalid input(s): POCBNP CBG: No results for input(s): GLUCAP in the last 168 hours. Urine analysis:    Component Value Date/Time   COLORURINE AMBER (A) 12/31/2018 2326   APPEARANCEUR CLOUDY (A) 12/31/2018 2326   LABSPEC 1.026 12/31/2018 2326   PHURINE 5.0 12/31/2018 2326   GLUCOSEU NEGATIVE 12/31/2018 2326   HGBUR SMALL (A) 12/31/2018 2326   BILIRUBINUR SMALL (A) 12/31/2018 2326   KETONESUR 20 (A) 12/31/2018 2326   PROTEINUR 100 (A)  12/31/2018 2326   UROBILINOGEN 0.2 03/01/2011 1427  NITRITE NEGATIVE 12/31/2018 2326   LEUKOCYTESUR NEGATIVE 12/31/2018 2326   Sepsis Labs: _0 (procalcitonin:4,lacticidven:4) ) Recent Results (from the past 240 hour(s))  Blood Culture (routine x 2)     Status: None (Preliminary result)   Collection Time: 02/16/19 12:19 PM  Result Value Ref Range Status   Specimen Description RIGHT ANTECUBITAL DRAWN BY RN  Final   Special Requests   Final    BOTTLES DRAWN AEROBIC AND ANAEROBIC Blood Culture adequate volume Performed at [facility], [address], [place], [place] [address]    Culture PENDING  Incomplete   Report Status PENDING  Incomplete  Blood Culture (routine x 2)     Status: None (Preliminary result)   Collection Time: 02/16/19 12:52 PM  Result Value Ref Range Status   Specimen Description BLOOD LEFT WRIST  Final   Special Requests   Final    BOTTLES DRAWN AEROBIC AND ANAEROBIC Blood Culture adequate volume Performed at [facility], [address], [place], [place] [address]    Culture PENDING  Incomplete   Report Status PENDING  Incomplete     Radiological Exams on Admission: Dg Chest Port 1 View  Result Date: 02/16/2019 CLINICAL DATA:  Shortness of breath for 2-3 days. Coughing up blood tinged sputum. Quit smoking 6 months ago. EXAM: PORTABLE CHEST 1 VIEW COMPARISON:  January 11, 2019 FINDINGS: No pneumothorax. Bilateral pulmonary opacities are identified with interstitial and alveolar components. The heart size borderline. The hila and mediastinum are unremarkable. No other acute abnormalities. IMPRESSION: Bilateral pulmonary infiltrates are favored to represent diffuse multifocal pneumonia. Asymmetric pulmonary edema considered less likely. Recommend clinical correlation and follow-up to resolution. Electronically Signed   By: Dorise Bullion III M.D   On: 02/16/2019 12:46    EKG: Independently reviewed. Sinus tachycardia, nonspecific T wave changes  PPE worn:  Bouffant cap                    Eye shield                    N95 mask         Gown and gloves    Time spent:60 minutes Code Status:   FULL Family Communication:  No Family at bedside Disposition Plan: expect 2-3 day hospitalization Consults called: none DVT Prophylaxis:  SCDs  Orson Eva, DO  [facility] Pager [phone]  If 7PM-7AM, please contact night-coverage [email or web address] Password [email or web address] 02/16/2019, 2:40 PM

## 2019-02-16 NOTE — ED Triage Notes (Signed)
Pt is on abx for pne started x 24 hours ago.  Upon ems arrival to pt home, ems states pt sats were in 50's.  Pt not normally on 02 at home but does have hx of copd. Solumedrol IM 125 and albuterol x 1  given by ems.  Pt is awake and alert resp even and labored.

## 2019-02-16 NOTE — ED Notes (Signed)
3rd lactic acid redrawn due to increase of 2nd lactic

## 2019-02-16 NOTE — ED Provider Notes (Signed)
[facility] EMERGENCY DEPARTMENT Provider Note   CSN: [id number] Arrival date & time: 02/16/19  1200    History   Chief Complaint Chief Complaint  Patient presents with  . Shortness of Breath    HPI Jeffrey [name] is a 58 y.o. male.     HPI Patient presents with 3 to 4 days of subjective fevers and chills.  Also has had increased shortness of breath with cough productive of blood-streaked sputum.  Denies chest pain.  No new lower extremity swelling or pain.  Initially patient was hypoxic when EMS arrived.  Given IM Solu-Medrol and albuterol neb x1.  Patient states he his breathing has improved.  No recent travel or known Covid contacts.  Patient was hospitalized earlier this year for multifocal pneumonia requiring intubation. Past Medical History:  Diagnosis Date  . Acute blood loss anemia 02/07/2017  . Anxiety   . Arthritis    deg disease, bulging disk,  shoulder level  . Bipolar disorder (HCC)   . Bipolar disorder (HCC)   . 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 Depression    anxiety  . Hyperlipidemia   . Hypertension   . Peptic ulcer disease    Review  . Pneumonia   . [name] (peripheral vascular disease) (HCC) 06/18/2015  . Shortness of breath     Patient Active Problem List   Diagnosis Date Noted  . Cough   . Dysphagia   . Palliative care by specialist   . Goals of care, counseling/discussion   . Wernicke encephalopathy syndrome 01/07/2019  . Toxic encephalopathy 12/16/2018  . Acute hepatic encephalopathy 12/16/2018  . Respiratory failure (HCC) 12/16/2018  . Hepatic encephalopathy (HCC)   . Acute respiratory failure with hypoxemia (HCC)   . Alcohol withdrawal syndrome, with delirium (HCC)   . COPD exacerbation (HCC) 09/16/2018  . Essential hypertension 09/16/2018  . GERD (gastroesophageal reflux disease) 09/16/2018  . Hypoxia   . Chronic diastolic CHF (congestive heart failure) (HCC)   . Bipolar disorder with severe depression (HCC) 02/01/2018  . Alcohol use  disorder, severe, dependence (HCC) 02/01/2018  . Cannabis use disorder, severe, dependence (HCC) 02/01/2018  . MDD (major depressive disorder), recurrent episode, severe (HCC) 01/31/2018  . Internal and external bleeding hemorrhoids   . Rectal bleeding   . Iron deficiency anemia due to chronic blood loss 03/09/2017  . Anemia   . GIB (gastrointestinal bleeding) 02/07/2017  . Acute blood loss anemia 02/07/2017  . Weakness generalized 02/07/2017  . Dizziness 02/07/2017  . Peptic ulcer disease   . Bipolar disorder (HCC)   . 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 Dyspnea 08/26/2016  . H/O: depression 08/26/2016  . Dyslipidemia 08/26/2016  . Tobacco use disorder 08/26/2016  . [name] (peripheral vascular disease) (HCC) 06/18/2015  . Chronic diarrhea 05/02/2013  . Hematochezia 05/02/2013  . Abnormal weight loss 05/02/2013  . Abdominal pain, periumbilical 05/02/2013    Past Surgical History:  Procedure Laterality Date  . BIOPSY  02/09/2017   Procedure: BIOPSY;  Surgeon: [name], MD;  Location: [facility];  Service: Endoscopy;;  duodenal gastric  . COLONOSCOPY  03/14/2007   BJY:[id number] colonoscopy and terminal ileoscopy except external hemorrhoids  . COLONOSCOPY N/A 05/05/2013   [name]: external/internal anal canal hemorrhoids, unable to intubate TI, segemental biopsies unremarkable   . COLONOSCOPY N/A 04/11/2017   Procedure: COLONOSCOPY;  Surgeon: [name], MD;  Location: [facility];  Service: Endoscopy;  Laterality: N/A;  . COLONOSCOPY WITH PROPOFOL N/A 03/31/2017  Procedure: COLONOSCOPY WITH PROPOFOL;  Surgeon: [name], MD;  Location: [facility];  Service: Endoscopy;  Laterality: N/A;  1:45pm  . ESOPHAGOGASTRODUODENOSCOPY  03/14/2007   [id number] antral gastritis with bulbar duodenitis/paucity to postbulbar duodenal folds and biopsy were benign with no evidence of villous atrophy.  . ESOPHAGOGASTRODUODENOSCOPY (EGD) WITH PROPOFOL N/A 02/09/2017   Procedure:  ESOPHAGOGASTRODUODENOSCOPY (EGD) WITH PROPOFOL;  Surgeon: [name], MD;  Location: [facility];  Service: Endoscopy;  Laterality: N/A;  . GIVENS CAPSULE STUDY N/A 04/08/2017   Procedure: GIVENS CAPSULE STUDY;  Surgeon: [name], MD;  Location: [facility];  Service: Endoscopy;  Laterality: N/A;  . HAND SURGERY     left, secondary to self-inflicted laceration  . HEMORRHOID SURGERY N/A 04/14/2017   Procedure: HEMORRHOIDECTOMY;  Surgeon: [name], MD;  Location: [facility] ORS;  Service: General;  Laterality: N/A;  . SHOULDER SURGERY     right  . TOE SURGERY     left great toe , amputated-lawnmover accident        Home Medications    Prior to Admission medications   Medication Sig Start Date End Date Taking? Authorizing Provider  albuterol (PROAIR HFA) 108 (90 Base) MCG/ACT inhaler Inhale 2 puffs into the lungs every 4 (four) hours as needed for wheezing or shortness of breath. 01/13/19   Mikhail, Nita Sells, DO  albuterol (PROVENTIL) (2.5 MG/3ML) 0.083% nebulizer solution Take 3 mLs (2.5 mg total) by nebulization every 4 (four) hours as needed for shortness of breath. 01/13/19   Edsel Petrin, DO  aspirin EC 81 MG tablet Take 81 mg by mouth every evening.     [provider]  atorvastatin (LIPITOR) 20 MG tablet Take 1 tablet (20 mg total) by mouth daily. For high cholesterol 02/09/18   Money, Gerlene Burdock, FNP  budesonide-formoterol (SYMBICORT) 160-4.5 MCG/ACT inhaler Inhale 2 puffs into the lungs 2 (two) times daily.    [provider]  chlorpheniramine-HYDROcodone (TUSSIONEX PENNKINETIC ER) 10-8 MG/5ML SUER Take 5 mLs by mouth at bedtime as needed for cough. 01/13/19   Mikhail, Nita Sells, DO  divalproex (DEPAKOTE ER) 500 MG 24 hr tablet Take 3 tablets (1,500 mg total) by mouth at bedtime. 11/28/18   Myrlene Broker, MD  FLUoxetine (PROZAC) 40 MG capsule Take 1 capsule (40 mg total) by mouth daily. For mood control 11/28/18   Myrlene Broker, MD  folic acid (FOLVITE) 1 MG  tablet Take 1 tablet (1 mg total) by mouth daily. 01/13/19 01/13/20  Edsel Petrin, DO  furosemide (LASIX) 20 MG tablet Take 1 tablet (20 mg total) by mouth daily. 06/18/18   Vassie Loll, MD  gabapentin (NEURONTIN) 400 MG capsule Take 1 capsule (400 mg total) by mouth 3 (three) times daily. 11/28/18   Myrlene Broker, MD  HYDROcodone-acetaminophen (NORCO/VICODIN) 5-325 MG tablet Take 1 tablet by mouth every 6 (six) hours as needed for moderate pain. 01/13/19 01/13/20  Edsel Petrin, DO  ipratropium (ATROVENT) 0.02 % nebulizer solution Take 2.5 mLs (0.5 mg total) by nebulization 3 (three) times daily. 01/13/19   Mikhail, Nita Sells, DO  methocarbamol (ROBAXIN) 500 MG tablet Take 1 tablet (500 mg total) by mouth every 8 (eight) hours as needed for muscle spasms. 01/13/19   Mikhail, Nita Sells, DO  metoprolol tartrate (LOPRESSOR) 50 MG tablet Take 1 tablet (50 mg total) by mouth 2 (two) times daily. 02/08/18   Money, Gerlene Burdock, FNP  Multiple Vitamin (MULTIVITAMIN WITH MINERALS) TABS tablet Take 1 tablet by mouth daily. 01/13/19  Mikhail, Maryann, DO  OLANZapine (ZYPREXA) 10 MG tablet Take 1.5 tablets (15 mg total) by mouth at bedtime. For mood stability 11/28/18   Myrlene Broker, MD  pantoprazole (PROTONIX) 20 MG tablet Take 1 tablet (20 mg total) by mouth daily. 06/17/18 06/17/19  Vassie Loll, MD  potassium chloride SA (K-DUR,KLOR-CON) 20 MEQ tablet Take 1 tablet (20 mEq total) by mouth daily. 06/18/18   Vassie Loll, MD  thiamine (VITAMIN B-1) 100 MG tablet Take 1 tablet (100 mg total) by mouth daily. 01/13/19   Mikhail, Nita Sells, DO  Tiotropium Bromide-Olodaterol (STIOLTO RESPIMAT) 2.5-2.5 MCG/ACT AERS Inhale 2 puffs into the lungs daily. 11/01/18   Leslye Peer, MD  traZODone (DESYREL) 50 MG tablet Take 1 tablet (50 mg total) by mouth at bedtime as needed for sleep. 11/28/18   Myrlene Broker, MD    Family History Family History  Problem Relation Age of Onset  . Breast cancer Mother        deceased  .  Heart disease Father   . Depression Daughter   . Anxiety disorder Daughter   . Anxiety disorder Son   . Depression Son   . Asthma Brother   . Heart attack Maternal Aunt   . Heart attack Maternal Uncle   . Heart attack Paternal Aunt   . Heart attack Paternal Uncle   . Heart attack Maternal Grandmother   . Heart attack Maternal Grandfather   . Emphysema Maternal Grandfather   . Heart attack Paternal Grandmother   . Heart attack Paternal Grandfather   . Colon cancer Neg Hx   . Liver disease Neg Hx     Social History Social History   Tobacco Use  . Smoking status: Current Every Day Smoker    Packs/day: 1.00    Years: 40.00    Pack years: 40.00    Types: Cigarettes    Start date: 08/04/1971  . Smokeless tobacco: Never Used  . Tobacco comment: peak rate of 2.5ppd, 1/2ppd on 11/27/2016 -- 6 cigarettes / day 01/25/18  Substance Use Topics  . Alcohol use: No    Alcohol/week: 0.0 standard drinks  . Drug use: Yes    Types: Marijuana    Comment: most days      Allergies   Augmentin [amoxicillin-pot clavulanate] and Ace inhibitors   Review of Systems Review of Systems  Constitutional: Positive for chills, fatigue and fever.  HENT: Negative for sore throat and trouble swallowing.   Eyes: Negative for visual disturbance.  Respiratory: Positive for cough and shortness of breath.   Cardiovascular: Negative for chest pain.  Gastrointestinal: Negative for abdominal pain, diarrhea, nausea and vomiting.  Genitourinary: Negative for dysuria and flank pain.  Musculoskeletal: Negative for back pain and neck pain.  Skin: Negative for rash and wound.  Neurological: Negative for dizziness, weakness, light-headedness, numbness and headaches.  All other systems reviewed and are negative.    Physical Exam Updated Vital Signs BP (!) 143/77   Pulse (!) 128   Temp (!) 101.3 F (38.5 C) (Oral)   Resp (!) 34   Ht  (1.778 m)   Wt 65.8 kg   SpO2 91%   BMI 20.81 kg/m   Physical  Exam Vitals signs and nursing note reviewed.  Constitutional:      Appearance: Normal appearance. He is well-developed.  HENT:     Head: Normocephalic and atraumatic.     Nose: Nose normal.     Mouth/Throat:     Mouth: Mucous membranes are  moist.  Eyes:     Pupils: Pupils are equal, round, and reactive to light.  Neck:     Musculoskeletal: Normal range of motion and neck supple.  Cardiovascular:     Rate and Rhythm: Regular rhythm. Tachycardia present.  Pulmonary:     Effort: Pulmonary effort is normal.     Breath sounds: Wheezing and rhonchi present.     Comments: Diffuse expiratory wheezes.  Diminished breath sounds and scattered rhonchi and bilateral bases. Abdominal:     General: Bowel sounds are normal. There is no distension.     Palpations: Abdomen is soft. There is no mass.     Tenderness: There is no abdominal tenderness. There is no right CVA tenderness, left CVA tenderness, guarding or rebound.     Hernia: No hernia is present.  Musculoskeletal: Normal range of motion.        General: No swelling, tenderness, deformity or signs of injury.     Right lower leg: No edema.     Left lower leg: No edema.  Skin:    General: Skin is warm and dry.     Capillary Refill: Capillary refill takes less than 2 seconds.     Findings: No erythema or rash.  Neurological:     General: No focal deficit present.     Mental Status: He is alert and oriented to person, place, and time.  Psychiatric:        Behavior: Behavior normal.      ED Treatments / Results  Labs (all labs ordered are listed, but only abnormal results are displayed) Labs Reviewed  LACTIC ACID, PLASMA - Abnormal; Notable for the following components:      Result Value   Lactic Acid, Venous 2.6 (*)    All other components within normal limits  COMPREHENSIVE METABOLIC PANEL - Abnormal; Notable for the following components:   Chloride 97 (*)    Albumin 2.8 (*)    AST 182 (*)    ALT 130 (*)    Alkaline  Phosphatase 420 (*)    All other components within normal limits  CBC WITH DIFFERENTIAL/PLATELET - Abnormal; Notable for the following components:   WBC 12.3 (*)    Hemoglobin 12.1 (*)    HCT 38.8 (*)    Platelets 691 (*)    nRBC 0.7 (*)    Neutro Abs 10.6 (*)    Abs Immature Granulocytes 0.19 (*)    All other components within normal limits  CULTURE, BLOOD (ROUTINE X 2)  CULTURE, BLOOD (ROUTINE X 2)  RESPIRATORY PANEL BY PCR  NOVEL CORONAVIRUS, NAA (HOSPITAL ORDER, SEND-OUT TO REF LAB)  LIPASE, BLOOD  PROTIME-INR  APTT  LACTIC ACID, PLASMA  URINALYSIS, ROUTINE W REFLEX MICROSCOPIC  INFLUENZA PANEL BY PCR (TYPE A & B)  LACTATE DEHYDROGENASE    EKG EKG Interpretation  Date/Time:  Thursday February 16 2019 12:07:15 EDT Ventricular Rate:  115 PR Interval:    QRS Duration: 83 QT Interval:  344 QTC Calculation: 476 R Axis:   92 Text Interpretation:  Sinus tachycardia Ventricular premature complex Aberrant complex LAE, consider biatrial enlargement Borderline right axis deviation Borderline prolonged QT interval Confirmed by Loren Racer ([id number]) on 02/16/2019 1:13:31 PM   Radiology Dg Chest Port 1 View  Result Date: 02/16/2019 CLINICAL DATA:  Shortness of breath for 2-3 days. Coughing up blood tinged sputum. Quit smoking 6 months ago. EXAM: PORTABLE CHEST 1 VIEW COMPARISON:  January 11, 2019 FINDINGS: No pneumothorax. Bilateral pulmonary opacities are identified with  interstitial and alveolar components. The heart size borderline. The hila and mediastinum are unremarkable. No other acute abnormalities. IMPRESSION: Bilateral pulmonary infiltrates are favored to represent diffuse multifocal pneumonia. Asymmetric pulmonary edema considered less likely. Recommend clinical correlation and follow-up to resolution. Electronically Signed   By: Gerome Sam III M.D   On: 02/16/2019 12:46    Procedures Procedures (including critical care time)  Medications Ordered in ED Medications   vancomycin (VANCOCIN) IVPB 1000 mg/200 mL premix (1,000 mg Intravenous New Bag/Given 02/16/19 1341)  sodium chloride 0.9 % bolus 1,000 mL (1,000 mLs Intravenous New Bag/Given 02/16/19 1255)    And  sodium chloride 0.9 % bolus 1,000 mL (1,000 mLs Intravenous New Bag/Given 02/16/19 1341)  albuterol (PROVENTIL,VENTOLIN) solution continuous neb (10 mg/hr Nebulization New Bag/Given 02/16/19 1237)  meropenem (MERREM) 1 g in sodium chloride 0.9 % 100 mL IVPB (1 g Intravenous New Bag/Given 02/16/19 1255)   CRITICAL CARE Performed by: Loren Racer Total critical care time: 35 minutes Critical care time was exclusive of separately billable procedures and treating other patients. Critical care was necessary to treat or prevent imminent or life-threatening deterioration. Critical care was time spent personally by me on the following activities: development of treatment plan with patient and/or surrogate as well as nursing, discussions with consultants, evaluation of patient's response to treatment, examination of patient, obtaining history from patient or surrogate, ordering and performing treatments and interventions, ordering and review of laboratory studies, ordering and review of radiographic studies, pulse oximetry and re-evaluation of patient's condition.  Initial Impression / Assessment and Plan / ED Course  I have reviewed the triage vital signs and the nursing notes.  Pertinent labs & imaging results that were available during my care of the patient were reviewed by me and considered in my medical decision making (see chart for details).       Initiated sepsis protocol.  Will cover with broad-spectrum antibiotics and start IV fluids. Patient speaking in full sentences.  Chest x-ray consistent with multifocal pneumonia.  Discussed with hospitalist will see patient in the emergency department and admit. Final Clinical Impressions(s) / ED Diagnoses   Final diagnoses:  Multifocal pneumonia     ED Discharge Orders    None       Loren Racer, MD 02/16/19 1355

## 2019-02-16 NOTE — ED Notes (Signed)
Pt given meal tray, held NRB for pt so pt could take bites of food.  Tolerated well

## 2019-02-16 NOTE — ED Notes (Signed)
CRITICAL VALUE ALERT  Critical Value:  Lactic acid 3.9  Date & Time Notied:  02/16/19 1645  Provider Notified: Dr. Arbutus Leas  Orders Received/Actions taken: na

## 2019-02-16 NOTE — ED Notes (Signed)
Pt daughter [name]: [phone]  Call for any updates or when pt is d/c

## 2019-02-16 NOTE — ED Notes (Signed)
Date and time results received: 02/16/19 1:08 PM  Test: lactic Critical Value: 2.6  Name of Provider Notified: Dr Ranae Palms   Orders Received? Or Actions Taken?:

## 2019-02-16 NOTE — ED Notes (Signed)
CRITICAL VALUE ALERT  Critical Value:  Lactic acid 3.4  Date & Time Notied:  1437 02/16/19  Provider Notified: Tat  Orders Received/Actions taken:

## 2019-02-16 NOTE — Progress Notes (Signed)
Notified MD of bloody sputum.

## 2019-02-17 ENCOUNTER — Inpatient Hospital Stay (HOSPITAL_COMMUNITY): Payer: Medicare Other

## 2019-02-17 DIAGNOSIS — A419 Sepsis, unspecified organism: Principal | ICD-10-CM

## 2019-02-17 DIAGNOSIS — J9601 Acute respiratory failure with hypoxia: Secondary | ICD-10-CM

## 2019-02-17 LAB — CBC WITH DIFFERENTIAL/PLATELET
Abs Immature Granulocytes: 0.09 10*3/uL — ABNORMAL HIGH (ref 0.00–0.07)
Basophils Absolute: 0 10*3/uL (ref 0.0–0.1)
Basophils Relative: 0 %
Eosinophils Absolute: 0 10*3/uL (ref 0.0–0.5)
Eosinophils Relative: 0 %
HCT: 30.5 % — ABNORMAL LOW (ref 39.0–52.0)
Hemoglobin: 9.2 g/dL — ABNORMAL LOW (ref 13.0–17.0)
Immature Granulocytes: 1 %
Lymphocytes Relative: 15 %
Lymphs Abs: 1.3 10*3/uL (ref 0.7–4.0)
MCH: 27.2 pg (ref 26.0–34.0)
MCHC: 30.2 g/dL (ref 30.0–36.0)
MCV: 90.2 fL (ref 80.0–100.0)
Monocytes Absolute: 0.8 10*3/uL (ref 0.1–1.0)
Monocytes Relative: 9 %
Neutro Abs: 6.3 10*3/uL (ref 1.7–7.7)
Neutrophils Relative %: 75 %
Platelets: 430 10*3/uL — ABNORMAL HIGH (ref 150–400)
RBC: 3.38 MIL/uL — ABNORMAL LOW (ref 4.22–5.81)
RDW: 14.1 % (ref 11.5–15.5)
WBC: 8.5 10*3/uL (ref 4.0–10.5)
nRBC: 0.9 % — ABNORMAL HIGH (ref 0.0–0.2)

## 2019-02-17 LAB — BLOOD GAS, ARTERIAL
Acid-base deficit: 0.6 mmol/L (ref 0.0–2.0)
Bicarbonate: 25 mmol/L (ref 20.0–28.0)
Drawn by: 257881
FIO2: 100
MECHVT: 560 mL
O2 Saturation: 95 %
PEEP: 10 cmH2O
Patient temperature: 37
RATE: 22 resp/min
pCO2 arterial: 48.8 mmHg — ABNORMAL HIGH (ref 32.0–48.0)
pH, Arterial: 7.33 — ABNORMAL LOW (ref 7.350–7.450)
pO2, Arterial: 87.4 mmHg (ref 83.0–108.0)

## 2019-02-17 LAB — COMPREHENSIVE METABOLIC PANEL
ALT: 104 U/L — ABNORMAL HIGH (ref 0–44)
AST: 101 U/L — ABNORMAL HIGH (ref 15–41)
Albumin: 2.1 g/dL — ABNORMAL LOW (ref 3.5–5.0)
Alkaline Phosphatase: 248 U/L — ABNORMAL HIGH (ref 38–126)
Anion gap: 9 (ref 5–15)
BUN: 14 mg/dL (ref 6–20)
CO2: 22 mmol/L (ref 22–32)
Calcium: 7.9 mg/dL — ABNORMAL LOW (ref 8.9–10.3)
Chloride: 107 mmol/L (ref 98–111)
Creatinine, Ser: 0.59 mg/dL — ABNORMAL LOW (ref 0.61–1.24)
GFR calc Af Amer: >60 mL/min (ref >60)
GFR calc non Af Amer: >60 mL/min (ref >60)
Glucose, Bld: 146 mg/dL — ABNORMAL HIGH (ref 70–99)
Potassium: 3.9 mmol/L (ref 3.5–5.1)
Sodium: 138 mmol/L (ref 135–145)
Total Bilirubin: 0.6 mg/dL (ref 0.3–1.2)
Total Protein: 5.7 g/dL — ABNORMAL LOW (ref 6.5–8.1)

## 2019-02-17 LAB — GLUCOSE, CAPILLARY: Glucose-Capillary: 121 mg/dL — ABNORMAL HIGH (ref 70–99)

## 2019-02-17 LAB — MRSA PCR SCREENING: MRSA by PCR: NEGATIVE

## 2019-02-17 LAB — TRIGLYCERIDES: Triglycerides: 121 mg/dL (ref <150)

## 2019-02-17 MED ORDER — FOLIC ACID 1 MG PO TABS
1.0000 mg | ORAL_TABLET | Freq: Every day | ORAL | Status: DC
  Administered 2019-02-18 – 2019-02-25 (×8): 1 mg
  Filled 2019-02-17 (×8): qty 1

## 2019-02-17 MED ORDER — METHYLPREDNISOLONE SODIUM SUCC 40 MG IJ SOLR
40.0000 mg | Freq: Three times a day (TID) | INTRAMUSCULAR | Status: DC
  Administered 2019-02-17 – 2019-02-18 (×2): 40 mg via INTRAVENOUS
  Filled 2019-02-17 (×2): qty 1

## 2019-02-17 MED ORDER — OLANZAPINE 5 MG PO TABS
15.0000 mg | ORAL_TABLET | Freq: Every day | ORAL | Status: DC
  Administered 2019-02-17 – 2019-02-24 (×8): 15 mg
  Filled 2019-02-17 (×8): qty 1

## 2019-02-17 MED ORDER — METHYLPREDNISOLONE SODIUM SUCC 125 MG IJ SOLR
60.0000 mg | Freq: Three times a day (TID) | INTRAMUSCULAR | Status: DC
  Administered 2019-02-17: 60 mg via INTRAVENOUS
  Filled 2019-02-17: qty 2

## 2019-02-17 MED ORDER — MIDAZOLAM HCL 2 MG/2ML IJ SOLN
INTRAMUSCULAR | Status: AC
  Administered 2019-02-17: 17:00:00
  Filled 2019-02-17: qty 4

## 2019-02-17 MED ORDER — DOCUSATE SODIUM 50 MG/5ML PO LIQD: 100.0000 mg | Freq: Two times a day (BID) | ORAL | Status: DC | PRN

## 2019-02-17 MED ORDER — SODIUM CHLORIDE 0.9% FLUSH
10.0000 mL | Freq: Two times a day (BID) | INTRAVENOUS | Status: DC
  Administered 2019-02-18: 10 mL
  Administered 2019-02-18: 20 mL
  Administered 2019-02-19 – 2019-02-23 (×8): 10 mL
  Administered 2019-02-24: 21:00:00 40 mL
  Administered 2019-02-24: 10 mL
  Administered 2019-02-25: 6 mL
  Administered 2019-02-25 – 2019-03-04 (×9): 10 mL

## 2019-02-17 MED ORDER — ATORVASTATIN CALCIUM 10 MG PO TABS
20.0000 mg | ORAL_TABLET | Freq: Every day | ORAL | Status: DC
  Administered 2019-02-18 – 2019-02-25 (×8): 20 mg
  Filled 2019-02-17 (×8): qty 2

## 2019-02-17 MED ORDER — FENTANYL CITRATE (PF) 100 MCG/2ML IJ SOLN
100.0000 ug | INTRAMUSCULAR | Status: DC | PRN
  Administered 2019-02-17 – 2019-02-23 (×8): 100 ug via INTRAVENOUS
  Filled 2019-02-17 (×8): qty 2

## 2019-02-17 MED ORDER — ENOXAPARIN SODIUM 40 MG/0.4ML ~~LOC~~ SOLN
40.0000 mg | SUBCUTANEOUS | Status: DC
  Administered 2019-02-17 – 2019-02-22 (×6): 40 mg via SUBCUTANEOUS
  Filled 2019-02-17 (×6): qty 0.4

## 2019-02-17 MED ORDER — SODIUM CHLORIDE 0.9 % IV SOLN
1.0000 g | Freq: Three times a day (TID) | INTRAVENOUS | Status: AC
  Administered 2019-02-17 – 2019-02-23 (×21): 1 g via INTRAVENOUS
  Filled 2019-02-17 (×24): qty 1

## 2019-02-17 MED ORDER — VITAL HIGH PROTEIN PO LIQD
1000.0000 mL | ORAL | Status: DC
  Administered 2019-02-17 – 2019-02-19 (×3): 1000 mL

## 2019-02-17 MED ORDER — MIDAZOLAM HCL 2 MG/2ML IJ SOLN: 2.0000 mg | INTRAMUSCULAR | Status: DC | PRN

## 2019-02-17 MED ORDER — FENTANYL CITRATE (PF) 100 MCG/2ML IJ SOLN
INTRAMUSCULAR | Status: AC
  Administered 2019-02-17: 17:00:00
  Filled 2019-02-17: qty 4

## 2019-02-17 MED ORDER — IPRATROPIUM BROMIDE HFA 17 MCG/ACT IN AERS
4.0000 | INHALATION_SPRAY | Freq: Four times a day (QID) | RESPIRATORY_TRACT | Status: DC
  Administered 2019-02-17 – 2019-02-18 (×3): 4 via RESPIRATORY_TRACT
  Filled 2019-02-17: qty 12.9

## 2019-02-17 MED ORDER — SODIUM CHLORIDE 0.9% FLUSH: 10.0000 mL | INTRAVENOUS | Status: DC | PRN

## 2019-02-17 MED ORDER — BISACODYL 10 MG RE SUPP: 10.0000 mg | Freq: Every day | RECTAL | Status: DC | PRN

## 2019-02-17 MED ORDER — GABAPENTIN 400 MG PO CAPS
400.0000 mg | ORAL_CAPSULE | Freq: Three times a day (TID) | ORAL | Status: DC
  Administered 2019-02-17 – 2019-02-18 (×2): 400 mg
  Filled 2019-02-17 (×2): qty 1

## 2019-02-17 MED ORDER — PRO-STAT SUGAR FREE PO LIQD
30.0000 mL | Freq: Two times a day (BID) | ORAL | Status: DC
  Administered 2019-02-17 – 2019-02-19 (×4): 30 mL
  Filled 2019-02-17 (×4): qty 30

## 2019-02-17 MED ORDER — VITAMIN B-1 100 MG PO TABS
100.0000 mg | ORAL_TABLET | Freq: Every day | ORAL | Status: DC
  Administered 2019-02-18 – 2019-02-25 (×8): 100 mg
  Filled 2019-02-17 (×8): qty 1

## 2019-02-17 MED ORDER — ALBUTEROL SULFATE HFA 108 (90 BASE) MCG/ACT IN AERS
2.0000 | INHALATION_SPRAY | Freq: Four times a day (QID) | RESPIRATORY_TRACT | Status: DC
  Administered 2019-02-17 – 2019-02-18 (×2): 4 via RESPIRATORY_TRACT
  Administered 2019-02-18: 2 via RESPIRATORY_TRACT
  Filled 2019-02-17: qty 6.7

## 2019-02-17 MED ORDER — SODIUM CHLORIDE 0.9 % IV SOLN
INTRAVENOUS | Status: DC | PRN
  Administered 2019-02-17: 1000 mL via INTRAVENOUS

## 2019-02-17 MED ORDER — CHLORHEXIDINE GLUCONATE CLOTH 2 % EX PADS
6.0000 | MEDICATED_PAD | Freq: Every day | CUTANEOUS | Status: DC
  Administered 2019-02-17 – 2019-02-18 (×2): 6 via TOPICAL

## 2019-02-17 MED ORDER — PROPOFOL 1000 MG/100ML IV EMUL
0.0000 ug/kg/min | INTRAVENOUS | Status: DC
  Administered 2019-02-17: 5 ug/kg/min via INTRAVENOUS
  Administered 2019-02-18 (×2): 25 ug/kg/min via INTRAVENOUS
  Administered 2019-02-18: 35 ug/kg/min via INTRAVENOUS
  Administered 2019-02-19: 30 ug/kg/min via INTRAVENOUS
  Administered 2019-02-19: 45 ug/kg/min via INTRAVENOUS
  Administered 2019-02-19 – 2019-02-20 (×3): 25 ug/kg/min via INTRAVENOUS
  Administered 2019-02-21 (×2): 45 ug/kg/min via INTRAVENOUS
  Administered 2019-02-21: 25 ug/kg/min via INTRAVENOUS
  Administered 2019-02-21: 35 ug/kg/min via INTRAVENOUS
  Administered 2019-02-22: 25 ug/kg/min via INTRAVENOUS
  Administered 2019-02-22: 20 ug/kg/min via INTRAVENOUS
  Administered 2019-02-23 – 2019-02-24 (×3): 25 ug/kg/min via INTRAVENOUS
  Filled 2019-02-17 (×20): qty 100

## 2019-02-17 MED ORDER — METOPROLOL TARTRATE 25 MG PO TABS
25.0000 mg | ORAL_TABLET | Freq: Two times a day (BID) | ORAL | Status: DC
  Administered 2019-02-17 – 2019-02-23 (×10): 25 mg
  Filled 2019-02-17 (×11): qty 1

## 2019-02-17 MED ORDER — PANTOPRAZOLE SODIUM 40 MG PO PACK
40.0000 mg | PACK | ORAL | Status: DC
  Administered 2019-02-17 – 2019-02-24 (×8): 40 mg
  Filled 2019-02-17 (×8): qty 20

## 2019-02-17 MED ORDER — ORAL CARE MOUTH RINSE
15.0000 mL | OROMUCOSAL | Status: DC
  Administered 2019-02-18 – 2019-02-24 (×59): 15 mL via OROMUCOSAL

## 2019-02-17 MED ORDER — CHLORHEXIDINE GLUCONATE 0.12% ORAL RINSE (MEDLINE KIT)
15.0000 mL | Freq: Two times a day (BID) | OROMUCOSAL | Status: DC
  Administered 2019-02-17 – 2019-02-24 (×15): 15 mL via OROMUCOSAL

## 2019-02-17 MED ORDER — ADULT MULTIVITAMIN W/MINERALS CH
1.0000 | ORAL_TABLET | Freq: Every day | ORAL | Status: DC
  Administered 2019-02-18 – 2019-02-22 (×5): 1
  Filled 2019-02-17 (×5): qty 1

## 2019-02-17 MED ORDER — ACETAMINOPHEN 325 MG PO TABS: 650.0000 mg | ORAL_TABLET | Freq: Four times a day (QID) | ORAL | Status: DC | PRN

## 2019-02-17 MED ORDER — FLUOXETINE HCL 20 MG PO CAPS
40.0000 mg | ORAL_CAPSULE | Freq: Every day | ORAL | Status: DC
  Administered 2019-02-18: 40 mg
  Filled 2019-02-17: qty 2

## 2019-02-17 MED ORDER — MIDAZOLAM HCL 2 MG/2ML IJ SOLN
2.0000 mg | INTRAMUSCULAR | Status: DC | PRN
  Administered 2019-02-19: 2 mg via INTRAVENOUS
  Filled 2019-02-17: qty 2

## 2019-02-17 NOTE — Procedures (Signed)
Central Venous Catheter Insertion Procedure Note Jeffrey [name] [id number] Aug 13, 1961  Procedure: Insertion of Central Venous Catheter Indications: Drug and/or fluid administration  Procedure Details Consent: Risks of procedure as well as the alternatives and risks of each were explained to the (patient/caregiver).  Consent for procedure obtained. Time Out: Verified patient identification, verified procedure, site/side was marked, verified correct patient position, special equipment/implants available, medications/allergies/relevent history reviewed, required imaging and test results available.  Performed  Maximum sterile technique was used including antiseptics, cap, gloves, gown, hand hygiene, mask and sheet. Skin prep: Chlorhexidine; local anesthetic administered A antimicrobial bonded/coated triple lumen catheter was placed in the left internal jugular vein using the Seldinger technique.  Evaluation Blood flow good Complications: No apparent complications Patient did tolerate procedure well. Chest X-ray ordered to verify placement.  CXR: pending.  Jeffrey Helling, MD [facility] Pulmonary/Critical Care 02/17/2019, 5:45 PM

## 2019-02-17 NOTE — Progress Notes (Signed)
PROGRESS NOTE    Jeffrey [name]  HYW:[id number] DOB: 04/30/1961 DOA: 02/16/2019 PCP: Lemmie Evens, MD   Brief Narrative:57 y.o. male with medical history of alcohol and polysubstance abuse, COPD, bipolar disorder, hypertension, hyperlipidemia,  and chronic diastolic CHF presenting with 1 week history of fevers, chills, coughing, shortness of breath.  The patient was recently discharged from the hospital after a prolonged stay from 12/16/2018 through 01/13/2019.  During that hospital stay, the patient was treated for acute respiratory failure secondary to Klebsiella pneumonia.  He required intubation and mechanical ventilation during that hospitalization.  His prolonged hospitalization was complicated by Warnicke's encephalopathy, hepatic encephalopathy with ammonia at 274.  The patient was subsequently discharged home with home health physical therapy.  Since discharge, the patient states that he has not had any travels nor known Covid contacts.  He states that he has not been out of his house much except to [place] his lawn.  He claims that his daughter has been helping him with grocery shopping and other errands.  He is also been seen by his grandson.  The patient states that his daughter and grandson have been healthy without any flulike symptoms.  Nevertheless, the patient stated that he went to see his primary care provider on 02/08/2019 with the symptoms described above.  The patient was prescribed levofloxacin with which he was compliant.  He stated that he had some initial improvement over the first 2 to 3 days, but in the last 2 to 3 days he has had increasing shortness of breath, coughing, wheezing.  The patient denies any headache, sore throat, chest pain, nausea, vomiting, diarrhea, abdominal pain, dysuria, hematuria, hematochezia, melena.  The patient states that he has a small amount of blood-tinged sputum.  The patient states that he has not smoked any cigarettes nor has he drank any alcohol  since his last admission to the hospital.  He endorsed compliance with all his medications and inhalers.  Because of worsening symptoms, he presented for further evaluation.  Upon presentation, the patient had temperature 101.3 F and was tachycardic into the 130s.  EMS noted the patient to have oxygen saturation in the low 60s.  He was given Solu-Medrol 125 mg IM.  In the emergency department, the patient was started on vancomycin and meropenem.  He was also given a 1 hour nebulizer treatment with albuterol.  BMP was unremarkable.  WBC was 12.3, hemoglobin 12.1, platelet 691,000.  AST 182, ALT 130, alk phosphatase 420, total bilirubin 0.6, albumin 2.8.  Lactic acid peaked at 3.4.  Blood cultures were obtained.  I have ordered Influenza and viral respiratory panel, and coronavirus NAA.  The patient was placed on a NRB with oxygen saturation 95-97% and he was able to speak in full sentences.   Assessment & Plan:   Principal Problem:   Sepsis due to undetermined organism [facility]) Active Problems:   Tobacco use disorder   Bipolar disorder ([place])   Essential hypertension   Acute respiratory failure with hypoxia (HCC)   Interstitial pneumonia ([place])  #1 acute hypoxic respiratory failure secondary to HCAP/COPD- bilateral pneumonia patient had recent hospital stay close to 1 month when he was intubated.  He was started on Vanco and meropenem.  Will DC vancomycin as MRSA PCR negative.  Will DC meropenem patient is able to tolerate cephalosporins in the past and will place him on cefepime.  Panel negative influenza a and B- COVID-19 pending.  Continue HFA Atrovent albuterol.  Patient is currently on 15 L nonrebreather.  I will also start him on Solu-Medrol since he was wheezing this morning.  He is able to talk in full sentences he part of his breakfast this morning.  He wants to be a full code.  #2 transaminitis secondary to sepsis versus alcohol versus Depakote hold Depakote recheck levels tomorrow  #3  alcohol and tobacco abuse in remission patient reports he has not since discharge in February 2020 nor had any alcohol since discharge at that time.  #4 bipolar disorder continue fluoxetine and Zyprexa hold Depakote in view of transaminitis  #5 essential hypertension continue metoprolol as he was taking at home.  #6 hyperlipidemia continue statin.   Code Status:   FULL Family Communication:  No Family at bedside Disposition Plan:PENDING CLINICAL PROGRESS Consults called: none    Estimated body mass index is 21.1 kg/m as calculated from the following:   Height as of this encounter: _0  (1.778 m).   Weight as of this encounter: 66.7 kg.  Subjective: AWAKE ALERT ON 15 L [place] HE REPORTS FEELING BETTER...  Objective: Vitals:   02/17/19 0400 02/17/19 0500 02/17/19 0600 02/17/19 0844  BP: 99/62 (!) 114/54 137/88 129/81  Pulse: 92 88 85 89  Resp: 20 (!) 23 (!) 23   Temp:      TempSrc:      SpO2: 91% 94% (!) 89%   Weight:      Height:        Intake/Output Summary (Last 24 hours) at 02/17/2019 0928 Last data filed at 02/17/2019 0630 Gross per 24 hour  Intake 2895.9 ml  Output 850 ml  Net 2045.9 ml   Filed Weights   02/16/19 1206 02/16/19 2200  Weight: 65.8 kg 66.7 kg    Examination:  General exam: Appears calm and comfortable  Respiratory system: Scattered rhonchi and wheezing to auscultation. Respiratory effort normal. Cardiovascular system: S1 & S2 heard, RRR. No JVD, murmurs, rubs, gallops or clicks. No pedal edema. Gastrointestinal system: Abdomen is nondistended, soft and nontender. No organomegaly or masses felt. Normal bowel sounds heard. Central nervous system: Alert and oriented. No focal neurological deficits. Extremities: Symmetric 5 x 5 power. Skin: No rashes, lesions or ulcers Psychiatry: Judgement and insight appear normal. Mood & affect appropriate.     Data Reviewed: I have personally reviewed following labs and imaging studies  CBC: Recent Labs   Lab 02/16/19 1219 02/17/19 0621  WBC 12.3* 8.5  NEUTROABS 10.6* 6.3  HGB 12.1* 9.2*  HCT 38.8* 30.5*  MCV 86.0 90.2  PLT 691* 782*   Basic Metabolic Panel: Recent Labs  Lab 02/16/19 1219 02/17/19 0621  NA 135 138  K 4.6 3.9  CL 97* 107  CO2 23 22  GLUCOSE 88 146*  BUN 17 14  CREATININE 0.78 0.59*  CALCIUM 8.9 7.9*   GFR: Estimated Creatinine Clearance: 95 mL/min (A) (by C-G formula based on SCr of 0.59 mg/dL (L)). Liver Function Tests: Recent Labs  Lab 02/16/19 1219 02/17/19 0621  AST 182* 101*  ALT 130* 104*  ALKPHOS 420* 248*  BILITOT 0.6 0.6  PROT 7.5 5.7*  ALBUMIN 2.8* 2.1*   Recent Labs  Lab 02/16/19 1219  LIPASE 17   No results for input(s): AMMONIA in the last 168 hours. Coagulation Profile: Recent Labs  Lab 02/16/19 1219  INR 1.2   Cardiac Enzymes: No results for input(s): CKTOTAL, CKMB, CKMBINDEX, TROPONINI in the last 168 hours. BNP (last 3 results) No results for input(s): PROBNP in the last 8760 hours. HbA1C: No results for  input(s): HGBA1C in the last 72 hours. CBG: No results for input(s): GLUCAP in the last 168 hours. Lipid Profile: No results for input(s): CHOL, HDL, LDLCALC, TRIG, CHOLHDL, LDLDIRECT in the last 72 hours. Thyroid Function Tests: No results for input(s): TSH, T4TOTAL, FREET4, T3FREE, THYROIDAB in the last 72 hours. Anemia Panel: No results for input(s): VITAMINB12, FOLATE, FERRITIN, TIBC, IRON, RETICCTPCT in the last 72 hours. Sepsis Labs: Recent Labs  Lab 02/16/19 1213 02/16/19 1412 02/16/19 1419 02/16/19 1610  PROCALCITON  --   --  0.19  --   LATICACIDVEN 2.6* 3.4*  --  3.9*    Recent Results (from the past 240 hour(s))  Blood Culture (routine x 2)     Status: None (Preliminary result)   Collection Time: 02/16/19 12:19 PM  Result Value Ref Range Status   Specimen Description RIGHT ANTECUBITAL DRAWN BY RN  Final   Special Requests   Final    BOTTLES DRAWN AEROBIC AND ANAEROBIC Blood Culture adequate  volume   Culture   Final    NO GROWTH < 24 HOURS Performed at [facility], [address] [place], [place] [address]    Report Status PENDING  Incomplete  Blood Culture (routine x 2)     Status: None (Preliminary result)   Collection Time: 02/16/19 12:52 PM  Result Value Ref Range Status   Specimen Description BLOOD LEFT WRIST  Final   Special Requests   Final    BOTTLES DRAWN AEROBIC AND ANAEROBIC Blood Culture adequate volume   Culture   Final    NO GROWTH < 24 HOURS Performed at [facility], [address], [place], [place] [address]    Report Status PENDING  Incomplete  Respiratory Panel by PCR     Status: None   Collection Time: 02/16/19  3:10 PM  Result Value Ref Range Status   Adenovirus NOT DETECTED NOT DETECTED Final   Coronavirus 229E NOT DETECTED NOT DETECTED Final    Comment: (NOTE) The Coronavirus on the Respiratory Panel, DOES NOT test for the novel  Coronavirus (2019 nCoV)    Coronavirus HKU1 NOT DETECTED NOT DETECTED Final   Coronavirus NL63 NOT DETECTED NOT DETECTED Final   Coronavirus OC43 NOT DETECTED NOT DETECTED Final   Metapneumovirus NOT DETECTED NOT DETECTED Final   Rhinovirus / Enterovirus NOT DETECTED NOT DETECTED Final   Influenza A NOT DETECTED NOT DETECTED Final   Influenza B NOT DETECTED NOT DETECTED Final   Parainfluenza Virus 1 NOT DETECTED NOT DETECTED Final   Parainfluenza Virus 2 NOT DETECTED NOT DETECTED Final   Parainfluenza Virus 3 NOT DETECTED NOT DETECTED Final   Parainfluenza Virus 4 NOT DETECTED NOT DETECTED Final   Respiratory Syncytial Virus NOT DETECTED NOT DETECTED Final   Bordetella pertussis NOT DETECTED NOT DETECTED Final   Chlamydophila pneumoniae NOT DETECTED NOT DETECTED Final   Mycoplasma pneumoniae NOT DETECTED NOT DETECTED Final    Comment: Performed at [facility], [place] [address] [place], [place] [address]  MRSA PCR Screening     Status: None   Collection Time: 02/16/19  9:58 PM  Result Value Ref Range Status    MRSA by PCR NEGATIVE NEGATIVE Final    Comment:        The GeneXpert MRSA Assay (FDA approved for NASAL specimens only), is one component of a comprehensive MRSA colonization surveillance program. It is not intended to diagnose MRSA infection nor to guide or monitor treatment for MRSA infections. Performed at [facility],  [place] [address], [place], [place] [address]          Radiology Studies: Dg Chest Port 1 View  Result Date: 02/16/2019 CLINICAL DATA:  Shortness of breath for 2-3 days. Coughing up blood tinged sputum. Quit smoking 6 months ago. EXAM: PORTABLE CHEST 1 VIEW COMPARISON:  January 11, 2019 FINDINGS: No pneumothorax. Bilateral pulmonary opacities are identified with interstitial and alveolar components. The heart size borderline. The hila and mediastinum are unremarkable. No other acute abnormalities. IMPRESSION: Bilateral pulmonary infiltrates are favored to represent diffuse multifocal pneumonia. Asymmetric pulmonary edema considered less likely. Recommend clinical correlation and follow-up to resolution. Electronically Signed   By: Dorise Bullion III M.D   On: 02/16/2019 12:46        Scheduled Meds: . albuterol  2 puff Inhalation Q4H  . aspirin EC  81 mg Oral QPM  . atorvastatin  20 mg Oral Daily  . FLUoxetine  40 mg Oral Daily  . fluticasone furoate-vilanterol  1 puff Inhalation Daily  . folic acid  1 mg Oral Daily  . gabapentin  400 mg Oral TID  . ipratropium  2 puff Inhalation Q4H  . metoprolol tartrate  50 mg Oral BID  . multivitamin with minerals  1 tablet Oral Daily  . OLANZapine  15 mg Oral QHS  . pantoprazole  20 mg Oral Daily  . thiamine  100 mg Oral Daily   Continuous Infusions: . sodium chloride 100 mL/hr at 02/16/19 2153  . sodium chloride 100 mL/hr at 02/17/19 0926  . albuterol 10 mg/hr (02/16/19 1237)  . ceFEPime (MAXIPIME) IV       LOS: 1 day     Georgette Shell, MD [facility] If 7PM-7AM, please  contact night-coverage [email or web address] Password [facility] 02/17/2019, 9:28 AM

## 2019-02-17 NOTE — Procedures (Signed)
Intubation Procedure Note Jeffrey [name] [id number] [birth year]  Procedure: Intubation Indications: Airway protection and maintenance  Procedure Details Consent: Risks of procedure as well as the alternatives and risks of each were explained to the (patient/caregiver).  Consent for procedure obtained. Time Out: Verified patient identification, verified procedure, site/side was marked, verified correct patient position, special equipment/implants available, medications/allergies/relevent history reviewed, required imaging and test results available.  Performed  Maximum sterile technique was used including antiseptics, cap, gloves, gown, hand hygiene and mask.  3    Evaluation Hemodynamic Status: BP stable throughout; O2 sats: stable throughout Patient's Current Condition: stable Complications: No apparent complications Patient did tolerate procedure well. Chest X-ray ordered to verify placement.  CXR: pending.   Katheren Shams 02/17/2019

## 2019-02-17 NOTE — Progress Notes (Signed)
I spoke with pt's daughter and her husband over the phone.  At the beginning of the conversation she was very upset that he was placed back on the ventilator.  I explained to her that he was having increased WOB and worsening hypoxia in spite of being on non-rebreather mask.  With concern for COVID, Bipap was not an option.  I explained that Jeffrey [name] verbally communicated to me his understanding about his condition, and that he stated he wanted therapies to keep him alive.  He also was able to explain to me in his own words that he was agreeable to intubation and mechanical ventilation in order to keep him alive.  After further discussion with his daughter and son in law, the conversation took on a much better tone.  His daughter expressed that she was concerned about his ability to recover given his medical condition and previous hospitalization.  She has requested that she be notified with any changes in health status or plans of care.  Coralyn Helling, MD [facility] Pulmonary/Critical Care 02/17/2019, 6:50 PM

## 2019-02-17 NOTE — Progress Notes (Signed)
Patient's O2 sat 82 on NRB 15L. Patient using accessory muscles to breathe, RR 37. Jerolyn Center, MD paged. Critical care MD Craige Cotta came to see patient and intubated. 2 versed, 100 fentanyl, 10 etomidate, and 70 of rocc given.

## 2019-02-17 NOTE — Consult Note (Signed)
NAME:  Jeffrey [name], MRN:  [id number], DOB:  02/20/1961, LOS: 1 ADMISSION DATE:  02/16/2019, CONSULTATION DATE:  02/17/2019 REFERRING MD:  Dr. Jerolyn Center, [facility], CHIEF COMPLAINT:  Short of breath   History   58 yo male former smoker presented with 1 week of fever, cough, dyspnea and chills.  Had admission 12/16/18 to 01/13/19 with Klebsiella PNA requiring intubation, hepatic encephalopathy with Wernicke's encephalopathy.  He denies recent travel or exposure to COVID patient.  Failed outpt ABx and admitted.  Started on treatment for HCAP and AECOPD.  Developed progressive respiratory distress with hypoxia and required intubation 3/27.  Past Medical History  ETOH, Polysubstance abuse, Severe COPD with emphysema, HTN, [name], Bipolar, Depression, Chronic diastolic CHF, PAD, PUD  Significant Hospital Events   3/26 Admit 3/27 VDRF  Consults:    Procedures:  ETT 3/27 >> Lt IJ CVL 3/27 >>  Significant Diagnostic Tests:    Micro Data:  Respiratory viral panel 3/26 >> negative Influenza PCR 3/26 >> negative COVID 19 3/26 >> Blood 3/26 >> COVID 3/27 >>  Tracheal aspirate 3/27 >>  Antimicrobials:  Cefepime 3/26 >>   Interim history/subjective:    Objective   Blood pressure 126/77, pulse 100, temperature (!) 96.9 F (36.1 C), temperature source Rectal, resp. rate (!) 24, height  (1.778 m), weight 66.7 kg, SpO2 92 %.    Vent Mode: PRVC FiO2 (%):  [100 %] 100 % Set Rate:  [22 bmp] 22 bmp Vt Set:  [560 mL] 560 mL PEEP:  [10 cmH20] 10 cmH20 Plateau Pressure:  [23 cmH20] 23 cmH20   Intake/Output Summary (Last 24 hours) at 02/17/2019 1745 Last data filed at 02/17/2019 1513 Gross per 24 hour  Intake 4065.42 ml  Output 1750 ml  Net 2315.42 ml   Filed Weights   02/16/19 1206 02/16/19 2200  Weight: 65.8 kg 66.7 kg    Examination:  General - alert, increased WOB, using accessory muscles Eyes - pupils reactive ENT - no sinus tenderness, no stridor Cardiac - regular  tachycardic Chest - b/l crackles Abdomen - soft, non tender, + bowel sounds Extremities - no cyanosis, clubbing, or edema Skin - no rashes Neuro - follows commands  CXR (reviewed by me) - b/l ASD   Resolved Hospital Problem list     Assessment & Plan:   HCAP with sepsis. Discussion: COVID sent, but low clinical suspicion given lack of exposure history.  RVP and influenza PCR negative. Plan - day 2 of ABx - check tracheal aspirate  Acute respiratory failure from HCAP. COPD exacerbation with severe COPD/emphysema. Plan - full vent support - ipratropium/albuterol MDI's q6h - solumedrol 40 mg q8h  Hx of HTN, [name]. Plan - ASA, lipitor, lopressor  Hx of Bipolar. Hx of ETOH, Polysubstance abuse. Plan - continue prozac, neurontin, zyprexa - diprivan with prn fentanyl, versed for RASS goal 0 to -1 - continue thiamine, folic acid, MVI  Anemia of chronic disease and critical illness. Plan - f/u CBC - transfuse for Hb < 7   Best practice:  Diet: tube feeds DVT prophylaxis: lovenox GI prophylaxis: protonix Mobility: bed rest Code Status: full code Family Communication: no family at bedside Disposition: ICU  Labs   CBC: Recent Labs  Lab 02/16/19 1219 02/17/19 0621  WBC 12.3* 8.5  NEUTROABS 10.6* 6.3  HGB 12.1* 9.2*  HCT 38.8* 30.5*  MCV 86.0 90.2  PLT 691* 430*    Basic Metabolic Panel: Recent Labs  Lab 02/16/19 1219 02/17/19 0621  NA 135 138  K 4.6 3.9  CL 97* 107  CO2 23 22  GLUCOSE 88 146*  BUN 17 14  CREATININE 0.78 0.59*  CALCIUM 8.9 7.9*   GFR: Estimated Creatinine Clearance: 95 mL/min (A) (by C-G formula based on SCr of 0.59 mg/dL (L)). Recent Labs  Lab 02/16/19 1213 02/16/19 1219 02/16/19 1412 02/16/19 1419 02/16/19 1610 02/17/19 0621  PROCALCITON  --   --   --  0.19  --   --   WBC  --  12.3*  --   --   --  8.5  LATICACIDVEN 2.6*  --  3.4*  --  3.9*  --     Liver Function Tests: Recent Labs  Lab 02/16/19 1219 02/17/19 0621   AST 182* 101*  ALT 130* 104*  ALKPHOS 420* 248*  BILITOT 0.6 0.6  PROT 7.5 5.7*  ALBUMIN 2.8* 2.1*   Recent Labs  Lab 02/16/19 1219  LIPASE 17   No results for input(s): AMMONIA in the last 168 hours.  ABG    Component Value Date/Time   PHART 7.469 (H) 12/21/2018 0425   PCO2ART 44.1 12/21/2018 0425   PO2ART 69.0 (L) 12/21/2018 0425   HCO3 31.6 (H) 12/21/2018 0425   TCO2 34 (H) 12/21/2018 0357   ACIDBASEDEF 2.0 12/18/2018 0613   O2SAT 93.9 12/21/2018 0425     Coagulation Profile: Recent Labs  Lab 02/16/19 1219  INR 1.2    Cardiac Enzymes: No results for input(s): CKTOTAL, CKMB, CKMBINDEX, TROPONINI in the last 168 hours.  HbA1C: No results found for: HGBA1C  CBG: No results for input(s): GLUCAP in the last 168 hours.  Review of Systems:   Unable to obtain.  Past Medical History  He,  has a past medical history of Acute blood loss anemia (02/07/2017), Anxiety, Arthritis, Bipolar disorder (HCC), Bipolar disorder (HCC), COPD, severe (HCC) (10/09/2016), Depression, Hyperlipidemia, Hypertension, Peptic ulcer disease, Pneumonia, [name] (peripheral vascular disease) (HCC) (06/18/2015), and Shortness of breath.   Surgical History    Past Surgical History:  Procedure Laterality Date  . BIOPSY  02/09/2017   Procedure: BIOPSY;  Surgeon: [name], MD;  Location: [facility];  Service: Endoscopy;;  duodenal gastric  . COLONOSCOPY  03/14/2007   OFH:QRFXJO colonoscopy and terminal ileoscopy except external hemorrhoids  . COLONOSCOPY N/A 05/05/2013   [name]: external/internal anal canal hemorrhoids, unable to intubate TI, segemental biopsies unremarkable   . COLONOSCOPY N/A 04/11/2017   Procedure: COLONOSCOPY;  Surgeon: [name], MD;  Location: [facility];  Service: Endoscopy;  Laterality: N/A;  . COLONOSCOPY WITH PROPOFOL N/A 03/31/2017   Procedure: COLONOSCOPY WITH PROPOFOL;  Surgeon: [name], MD;  Location: [facility];  Service: Endoscopy;   Laterality: N/A;  1:45pm  . ESOPHAGOGASTRODUODENOSCOPY  03/14/2007   ITG:PQDIYMEBRA antral gastritis with bulbar duodenitis/paucity to postbulbar duodenal folds and biopsy were benign with no evidence of villous atrophy.  . ESOPHAGOGASTRODUODENOSCOPY (EGD) WITH PROPOFOL N/A 02/09/2017   Procedure: ESOPHAGOGASTRODUODENOSCOPY (EGD) WITH PROPOFOL;  Surgeon: [name], MD;  Location: [facility];  Service: Endoscopy;  Laterality: N/A;  . GIVENS CAPSULE STUDY N/A 04/08/2017   Procedure: GIVENS CAPSULE STUDY;  Surgeon: [name], MD;  Location: [facility];  Service: Endoscopy;  Laterality: N/A;  . HAND SURGERY     left, secondary to self-inflicted laceration  . HEMORRHOID SURGERY N/A 04/14/2017   Procedure: HEMORRHOIDECTOMY;  Surgeon: [name], MD;  Location: [facility];  Service: General;  Laterality: N/A;  . SHOULDER SURGERY  right  . TOE SURGERY     left great toe , amputated-lawnmover accident     Social History   reports that he has been smoking cigarettes. He started smoking about 47 years ago. He has a 40.00 pack-year smoking history. He has never used smokeless tobacco. He reports current drug use. Drug: Marijuana. He reports that he does not drink alcohol.   Family History   His family history includes Anxiety disorder in his daughter and son; Asthma in his brother; Breast cancer in his mother; Depression in his daughter and son; Emphysema in his maternal grandfather; Heart attack in his maternal aunt, maternal grandfather, maternal grandmother, maternal uncle, paternal aunt, paternal grandfather, paternal grandmother, and paternal uncle; Heart disease in his father. There is no history of Colon cancer or Liver disease.   Allergies Allergies  Allergen Reactions  . Augmentin [Amoxicillin-Pot Clavulanate] Rash and Other (See Comments)    Has patient had a PCN reaction causing immediate rash, facial/tongue/throat swelling, SOB or lightheadedness with hypotension: No Has  patient had a PCN reaction causing severe rash involving mucus membranes or skin necrosis: No Has patient had a PCN reaction that required hospitalization No Has patient had a PCN reaction occurring within the last 10 years: Yes If all of the above answers are "NO", then may proceed with Cephalosporin use.  Donivan Scull Inhibitors Hives     Home Medications  Prior to Admission medications   Medication Sig Start Date End Date Taking? Authorizing Provider  albuterol (PROAIR HFA) 108 (90 Base) MCG/ACT inhaler Inhale 2 puffs into the lungs every 4 (four) hours as needed for wheezing or shortness of breath. 01/13/19  Yes Mikhail, Nita Sells, DO  albuterol (PROVENTIL) (2.5 MG/3ML) 0.083% nebulizer solution Take 3 mLs (2.5 mg total) by nebulization every 4 (four) hours as needed for shortness of breath. 01/13/19  Yes Mikhail, Nita Sells, DO  aspirin EC 81 MG tablet Take 81 mg by mouth every evening.    Yes [provider]  atorvastatin (LIPITOR) 20 MG tablet Take 1 tablet (20 mg total) by mouth daily. For high cholesterol 02/09/18  Yes Money, Gerlene Burdock, FNP  diphenhydramine-acetaminophen (TYLENOL PM) 25-500 MG TABS tablet Take 1 tablet by mouth at bedtime as needed.   Yes [provider]  divalproex (DEPAKOTE ER) 500 MG 24 hr tablet Take 3 tablets (1,500 mg total) by mouth at bedtime. 11/28/18  Yes Myrlene Broker, MD  FLUoxetine (PROZAC) 40 MG capsule Take 1 capsule (40 mg total) by mouth daily. For mood control 11/28/18  Yes Myrlene Broker, MD  furosemide (LASIX) 20 MG tablet Take 1 tablet (20 mg total) by mouth daily. 06/18/18  Yes Vassie Loll, MD  gabapentin (NEURONTIN) 400 MG capsule Take 1 capsule (400 mg total) by mouth 3 (three) times daily. Patient taking differently: Take 400-800 mg by mouth See admin instructions. 400mg  in the morning and 800mg  in the evening 11/28/18  Yes Myrlene Broker, MD  ipratropium (ATROVENT) 0.02 % nebulizer solution Take 2.5 mLs (0.5 mg total) by nebulization 3 (three)  times daily. 01/13/19  Yes Mikhail, Quilcene, DO  metoprolol tartrate (LOPRESSOR) 50 MG tablet Take 1 tablet (50 mg total) by mouth 2 (two) times daily. 02/08/18  Yes Money, Gerlene Burdock, FNP  Multiple Vitamin (MULTIVITAMIN WITH MINERALS) TABS tablet Take 1 tablet by mouth daily. 01/13/19  Yes Mikhail, Maryann, DO  OLANZapine (ZYPREXA) 10 MG tablet Take 1.5 tablets (15 mg total) by mouth at bedtime. For mood stability Patient taking differently:  Take 10 mg by mouth at bedtime. For mood stability 11/28/18  Yes Myrlene Broker, MD  potassium chloride SA (K-DUR,KLOR-CON) 20 MEQ tablet Take 1 tablet (20 mEq total) by mouth daily. 06/18/18  Yes Vassie Loll, MD  thiamine (VITAMIN B-1) 100 MG tablet Take 1 tablet (100 mg total) by mouth daily. Patient taking differently: Take 250 mg by mouth daily.  01/13/19  Yes Mikhail, Nita Sells, DO  traZODone (DESYREL) 50 MG tablet Take 1 tablet (50 mg total) by mouth at bedtime as needed for sleep. Patient taking differently: Take 50 mg by mouth at bedtime.  11/28/18  Yes Myrlene Broker, MD     Critical care time: 47 minutes independent of procedure time    Coralyn Helling, MD [facility] Pulmonary/Critical Care 02/17/2019, 6:00 PM

## 2019-02-17 NOTE — Procedures (Signed)
Intubation Procedure Note Jeffrey [name] [id number] 1961-07-02  Procedure: Intubation Indications: Respiratory insufficiency  Procedure Details Consent: Risks of procedure as well as the alternatives and risks of each were explained to the (patient/caregiver).  Consent for procedure obtained. Time Out: Verified patient identification, verified procedure, site/side was marked, verified correct patient position, special equipment/implants available, medications/allergies/relevent history reviewed, required imaging and test results available.  Performed  Maximum sterile technique was used including cap, gloves, gown, hand hygiene and mask.  MAC and 3  Given 2 mg versed, 100 mcg fentanyl, 10 mg etomidate, 60 mg rocuronium.  Inserted 7.5 ETT to 23 cm at lip using glidescope w/o difficulty.  Evaluation Hemodynamic Status: BP stable throughout; O2 sats: stable throughout Patient's Current Condition: stable Complications: No apparent complications Patient did tolerate procedure well. Chest X-ray ordered to verify placement.  CXR: pending.   Chesley Mires, MD [facility] Pulmonary/Critical Care 02/17/2019, 5:44 PM

## 2019-02-18 DIAGNOSIS — F3130 Bipolar disorder, current episode depressed, mild or moderate severity, unspecified: Secondary | ICD-10-CM

## 2019-02-18 LAB — GLUCOSE, CAPILLARY
Glucose-Capillary: 123 mg/dL — ABNORMAL HIGH (ref 70–99)
Glucose-Capillary: 127 mg/dL — ABNORMAL HIGH (ref 70–99)
Glucose-Capillary: 128 mg/dL — ABNORMAL HIGH (ref 70–99)
Glucose-Capillary: 135 mg/dL — ABNORMAL HIGH (ref 70–99)
Glucose-Capillary: 136 mg/dL — ABNORMAL HIGH (ref 70–99)
Glucose-Capillary: 155 mg/dL — ABNORMAL HIGH (ref 70–99)

## 2019-02-18 LAB — COMPREHENSIVE METABOLIC PANEL
ALT: 89 U/L — ABNORMAL HIGH (ref 0–44)
AST: 59 U/L — ABNORMAL HIGH (ref 15–41)
Albumin: 2 g/dL — ABNORMAL LOW (ref 3.5–5.0)
Alkaline Phosphatase: 336 U/L — ABNORMAL HIGH (ref 38–126)
Anion gap: 5 (ref 5–15)
BUN: 22 mg/dL — ABNORMAL HIGH (ref 6–20)
CO2: 28 mmol/L (ref 22–32)
Calcium: 8.1 mg/dL — ABNORMAL LOW (ref 8.9–10.3)
Chloride: 110 mmol/L (ref 98–111)
Creatinine, Ser: 0.58 mg/dL — ABNORMAL LOW (ref 0.61–1.24)
GFR calc Af Amer: >60 mL/min (ref >60)
GFR calc non Af Amer: >60 mL/min (ref >60)
Glucose, Bld: 138 mg/dL — ABNORMAL HIGH (ref 70–99)
Potassium: 4.5 mmol/L (ref 3.5–5.1)
Sodium: 143 mmol/L (ref 135–145)
Total Bilirubin: 0.4 mg/dL (ref 0.3–1.2)
Total Protein: 5.3 g/dL — ABNORMAL LOW (ref 6.5–8.1)

## 2019-02-18 LAB — CBC WITH DIFFERENTIAL/PLATELET
Abs Immature Granulocytes: 0.05 10*3/uL (ref 0.00–0.07)
Basophils Absolute: 0 10*3/uL (ref 0.0–0.1)
Basophils Relative: 0 %
Eosinophils Absolute: 0 10*3/uL (ref 0.0–0.5)
Eosinophils Relative: 0 %
HCT: 31.7 % — ABNORMAL LOW (ref 39.0–52.0)
Hemoglobin: 8.8 g/dL — ABNORMAL LOW (ref 13.0–17.0)
Immature Granulocytes: 1 %
Lymphocytes Relative: 10 %
Lymphs Abs: 0.6 10*3/uL — ABNORMAL LOW (ref 0.7–4.0)
MCH: 26.4 pg (ref 26.0–34.0)
MCHC: 27.8 g/dL — ABNORMAL LOW (ref 30.0–36.0)
MCV: 95.2 fL (ref 80.0–100.0)
Monocytes Absolute: 0.2 10*3/uL (ref 0.1–1.0)
Monocytes Relative: 4 %
Neutro Abs: 4.9 10*3/uL (ref 1.7–7.7)
Neutrophils Relative %: 85 %
Platelets: 374 10*3/uL (ref 150–400)
RBC: 3.33 MIL/uL — ABNORMAL LOW (ref 4.22–5.81)
RDW: 14 % (ref 11.5–15.5)
WBC: 5.8 10*3/uL (ref 4.0–10.5)
nRBC: 0.7 % — ABNORMAL HIGH (ref 0.0–0.2)

## 2019-02-18 LAB — MAGNESIUM: Magnesium: 2.2 mg/dL (ref 1.7–2.4)

## 2019-02-18 LAB — PHOSPHORUS: Phosphorus: 3.7 mg/dL (ref 2.5–4.6)

## 2019-02-18 MED ORDER — IPRATROPIUM-ALBUTEROL 0.5-2.5 (3) MG/3ML IN SOLN
3.0000 mL | Freq: Four times a day (QID) | RESPIRATORY_TRACT | Status: DC
  Administered 2019-02-18 – 2019-02-26 (×30): 3 mL via RESPIRATORY_TRACT
  Filled 2019-02-18 (×30): qty 3

## 2019-02-18 MED ORDER — ASPIRIN 81 MG PO CHEW
81.0000 mg | CHEWABLE_TABLET | Freq: Every day | ORAL | Status: DC
  Administered 2019-02-18 – 2019-02-24 (×7): 81 mg
  Filled 2019-02-18 (×7): qty 1

## 2019-02-18 MED ORDER — GABAPENTIN 250 MG/5ML PO SOLN
400.0000 mg | Freq: Three times a day (TID) | ORAL | Status: DC
  Administered 2019-02-18 – 2019-02-24 (×18): 400 mg
  Filled 2019-02-18 (×21): qty 8

## 2019-02-18 MED ORDER — FLUOXETINE HCL 20 MG/5ML PO SOLN
40.0000 mg | Freq: Every day | ORAL | Status: DC
  Administered 2019-02-19 – 2019-02-24 (×6): 40 mg
  Filled 2019-02-18 (×7): qty 10

## 2019-02-18 NOTE — Progress Notes (Signed)
NAME:  Jeffrey [name], MRN:  [id number], DOB:  June 29, 1961, LOS: 2 ADMISSION DATE:  02/16/2019, CONSULTATION DATE:  02/16/2019 REFERRING MD:  Jeffrey [name] TRH, CHIEF COMPLAINT:  Shortness of breath   Brief History   58 y/o former smoker admitted with dyspnea, cough fever after a recent hospital discharge for Klebsiella pneumonia requiring intubation.  During that admission there was concern for wernicke's encephalopathy.  Denies recent travel or exposure to COVID patient.  Required intubation for worsening hypoxemia on 3/27.   Past Medical History  ETOH, Polysubstance abuse, Severe COPD with emphysema, HTN, [name], Bipolar, Depression, Chronic diastolic CHF, PAD, PUD  Significant Hospital Events   3/26 Admit 3/27 VDRF  Consults:    Procedures:  ETT 3/27 >> Lt IJ CVL 3/27 >>  Significant Diagnostic Tests:    Micro Data:  Respiratory viral panel 3/26 >> negative Influenza PCR 3/26 >> negative COVID 19 3/26 >> Blood 3/26 >> COVID 3/27 >>  Tracheal aspirate 3/27 >>  Antimicrobials:  Cefepime 3/26 >>    Interim history/subjective:  Overnight remains on high vent support Remains sedated Alert on propofol infusion Some oliguria overnight On tube feeds  Objective   Blood pressure 109/63, pulse 80, temperature 97.9 F (36.6 C), resp. rate 18, height 5\' 10"  (1.778 m), weight 70.7 kg, SpO2 97 %.    Vent Mode: PRVC FiO2 (%):  [80 %-100 %] 80 % Set Rate:  [22 bmp] 22 bmp Vt Set:  [560 mL] 560 mL PEEP:  [10 cmH20] 10 cmH20 Plateau Pressure:  [17 cmH20-48 cmH20] 17 cmH20   Intake/Output Summary (Last 24 hours) at 02/18/2019 1053 Last data filed at 02/18/2019 0800 Gross per 24 hour  Intake 2082.84 ml  Output 1110 ml  Net 972.84 ml   Filed Weights   02/16/19 1206 02/16/19 2200 02/18/19 0500  Weight: 65.8 kg 66.7 kg 70.7 kg    Examination:  General:  In bed on vent HENT: NCAT ETT in place PULM: CTA B, vent supported breathing CV: RRR, no mgr GI: BS+, soft,  nontender MSK: normal bulk and tone Neuro: sedated on vent    Resolved Hospital Problem list     Assessment & Plan:  COPD exacerbation > resolved, not wheezing > stop solumedrol > duoneb qid  HCAP/Influenza like illness, concern for COVID 19 > f/u culture/swab results > continue cefepime  Acute respiratory failure with hypoxemia > continue full ventilator support > wean PEEP/FiO2 for SaO2 > 90% > VAP prevention protocol  HTN, [name] > ASA, lipitor, lopressor  Bipolar disorder > history of wernicke's encephalopathy/hepatic encephalopathy > contiue home prozac, neurontin, zyprexa > continue sedation with prn fentanyl, versed, propofol titrated to RASS 0 to -1 > thiamine/folate  Anemia of chronic illness > Monitor for bleeding > Transfuse PRBC for Hgb < 7 gm/dL   Best practice:  Diet: tube feeding Pain/Anxiety/Delirium protocol (if indicated):  VAP protocol (if indicated): yes DVT prophylaxis: lovenox GI prophylaxis: pantoprazole Glucose control: SSI Mobility: bed rest Code Status: full Family Communication: Called daughter Jeffrey [name] at 1:31 PM on 3/28, she did not answer, left message Disposition: remain in ICU  Labs   CBC: Recent Labs  Lab 02/16/19 1219 02/17/19 0621 02/18/19 0407  WBC 12.3* 8.5 5.8  NEUTROABS 10.6* 6.3 4.9  HGB 12.1* 9.2* 8.8*  HCT 38.8* 30.5* 31.7*  MCV 86.0 90.2 95.2  PLT 691* 430* 374    Basic Metabolic Panel: Recent Labs  Lab 02/16/19 1219 02/17/19 0621 02/18/19 0407  NA 135 138 143  K  4.6 3.9 4.5  CL 97* 107 110  CO2 23 22 28   GLUCOSE 88 146* 138*  BUN 17 14 22*  CREATININE 0.78 0.59* 0.58*  CALCIUM 8.9 7.9* 8.1*  MG  --   --  2.2  PHOS  --   --  3.7   GFR: Estimated Creatinine Clearance: 100.6 mL/min (A) (by C-G formula based on SCr of 0.58 mg/dL (L)). Recent Labs  Lab 02/16/19 1213 02/16/19 1219 02/16/19 1412 02/16/19 1419 02/16/19 1610 02/17/19 0621 02/18/19 0407  PROCALCITON  --   --   --  0.19  --   --    --   WBC  --  12.3*  --   --   --  8.5 5.8  LATICACIDVEN 2.6*  --  3.4*  --  3.9*  --   --     Liver Function Tests: Recent Labs  Lab 02/16/19 1219 02/17/19 0621 02/18/19 0407  AST 182* 101* 59*  ALT 130* 104* 89*  ALKPHOS 420* 248* 336*  BILITOT 0.6 0.6 0.4  PROT 7.5 5.7* 5.3*  ALBUMIN 2.8* 2.1* 2.0*   Recent Labs  Lab 02/16/19 1219  LIPASE 17   No results for input(s): AMMONIA in the last 168 hours.  ABG    Component Value Date/Time   PHART 7.330 (L) 02/17/2019 1850   PCO2ART 48.8 (H) 02/17/2019 1850   PO2ART 87.4 02/17/2019 1850   HCO3 25.0 02/17/2019 1850   TCO2 34 (H) 12/21/2018 0357   ACIDBASEDEF 0.6 02/17/2019 1850   O2SAT 95.0 02/17/2019 1850     Coagulation Profile: Recent Labs  Lab 02/16/19 1219  INR 1.2    Cardiac Enzymes: No results for input(s): CKTOTAL, CKMB, CKMBINDEX, TROPONINI in the last 168 hours.  HbA1C: No results found for: HGBA1C  CBG: Recent Labs  Lab 02/17/19 2217 02/18/19 0034 02/18/19 0406 02/18/19 0801  GLUCAP 121* 128* 127* 136*     Critical care time: 31 minutes    Jeffrey [place], MD  PCCM Pager: [phone] Cell: [phone] If no response, call [phone]

## 2019-02-18 NOTE — Progress Notes (Signed)
[facility] PCCM  Tried calling daughter [name] again, no answer, left message.  Heber [place], MD [place] P[facility]M Pager: [phone] Cell: [phone] If no response, call [phone]

## 2019-02-18 NOTE — Progress Notes (Signed)
Patient FiO2 weaned to 40%; PEEP remaining at +10. O2 sats currently 90-92%. Will attempt to wean PEEP at later time if FiO2 change tolerated by patient. RT will continue to monitor patient.

## 2019-02-19 ENCOUNTER — Inpatient Hospital Stay (HOSPITAL_COMMUNITY): Payer: Medicare Other

## 2019-02-19 ENCOUNTER — Encounter (HOSPITAL_COMMUNITY): Payer: Self-pay | Admitting: Pulmonary Disease

## 2019-02-19 DIAGNOSIS — I10 Essential (primary) hypertension: Secondary | ICD-10-CM

## 2019-02-19 DIAGNOSIS — J849 Interstitial pulmonary disease, unspecified: Secondary | ICD-10-CM

## 2019-02-19 LAB — BASIC METABOLIC PANEL
Anion gap: 6 (ref 5–15)
BUN: 34 mg/dL — ABNORMAL HIGH (ref 6–20)
CO2: 29 mmol/L (ref 22–32)
Calcium: 8.1 mg/dL — ABNORMAL LOW (ref 8.9–10.3)
Chloride: 110 mmol/L (ref 98–111)
Creatinine, Ser: 0.48 mg/dL — ABNORMAL LOW (ref 0.61–1.24)
GFR calc Af Amer: >60 mL/min (ref >60)
GFR calc non Af Amer: >60 mL/min (ref >60)
Glucose, Bld: 94 mg/dL (ref 70–99)
Potassium: 3.8 mmol/L (ref 3.5–5.1)
Sodium: 145 mmol/L (ref 135–145)

## 2019-02-19 LAB — CBC
HCT: 32.1 % — ABNORMAL LOW (ref 39.0–52.0)
Hemoglobin: 9.4 g/dL — ABNORMAL LOW (ref 13.0–17.0)
MCH: 27.5 pg (ref 26.0–34.0)
MCHC: 29.3 g/dL — ABNORMAL LOW (ref 30.0–36.0)
MCV: 93.9 fL (ref 80.0–100.0)
Platelets: 373 10*3/uL (ref 150–400)
RBC: 3.42 MIL/uL — ABNORMAL LOW (ref 4.22–5.81)
RDW: 14.6 % (ref 11.5–15.5)
WBC: 8.5 10*3/uL (ref 4.0–10.5)
nRBC: 0.5 % — ABNORMAL HIGH (ref 0.0–0.2)

## 2019-02-19 LAB — GLUCOSE, CAPILLARY
Glucose-Capillary: 105 mg/dL — ABNORMAL HIGH (ref 70–99)
Glucose-Capillary: 116 mg/dL — ABNORMAL HIGH (ref 70–99)
Glucose-Capillary: 118 mg/dL — ABNORMAL HIGH (ref 70–99)
Glucose-Capillary: 125 mg/dL — ABNORMAL HIGH (ref 70–99)

## 2019-02-19 MED ORDER — VITAL AF 1.2 CAL PO LIQD
1000.0000 mL | ORAL | Status: DC
  Administered 2019-02-19 – 2019-02-21 (×3): 1000 mL

## 2019-02-19 MED ORDER — BUDESONIDE 0.5 MG/2ML IN SUSP
0.5000 mg | Freq: Two times a day (BID) | RESPIRATORY_TRACT | Status: DC
  Administered 2019-02-19 – 2019-02-26 (×15): 0.5 mg via RESPIRATORY_TRACT
  Filled 2019-02-19 (×15): qty 2

## 2019-02-19 MED ORDER — ARFORMOTEROL TARTRATE 15 MCG/2ML IN NEBU
15.0000 ug | INHALATION_SOLUTION | Freq: Two times a day (BID) | RESPIRATORY_TRACT | Status: DC
  Administered 2019-02-19 – 2019-02-26 (×15): 15 ug via RESPIRATORY_TRACT
  Filled 2019-02-19 (×15): qty 2

## 2019-02-19 MED ORDER — FENTANYL 2500MCG IN NS 250ML (10MCG/ML) PREMIX INFUSION
0.0000 ug/h | INTRAVENOUS | Status: DC
  Administered 2019-02-19: 25 ug/h via INTRAVENOUS

## 2019-02-19 MED ORDER — ROCURONIUM BROMIDE 50 MG/5ML IV SOLN
70.0000 mg | Freq: Once | INTRAVENOUS | Status: AC
  Administered 2019-02-19: 70 mg via INTRAVENOUS
  Filled 2019-02-19: qty 7

## 2019-02-19 MED ORDER — ALBUTEROL SULFATE (2.5 MG/3ML) 0.083% IN NEBU
10.0000 mg | INHALATION_SOLUTION | Freq: Once | RESPIRATORY_TRACT | Status: AC
  Administered 2019-02-19: 10 mg via RESPIRATORY_TRACT

## 2019-02-19 MED ORDER — ALBUTEROL SULFATE (2.5 MG/3ML) 0.083% IN NEBU
2.5000 mg | INHALATION_SOLUTION | RESPIRATORY_TRACT | Status: DC | PRN
  Administered 2019-02-19 – 2019-02-26 (×2): 2.5 mg via RESPIRATORY_TRACT
  Filled 2019-02-19 (×2): qty 3

## 2019-02-19 MED ORDER — METHYLPREDNISOLONE SODIUM SUCC 40 MG IJ SOLR
40.0000 mg | Freq: Every day | INTRAMUSCULAR | Status: DC
  Administered 2019-02-19 – 2019-02-23 (×5): 40 mg via INTRAVENOUS
  Filled 2019-02-19 (×5): qty 1

## 2019-02-19 MED ORDER — ROCURONIUM BROMIDE 50 MG/5ML IV SOLN
70.0000 mg | Freq: Once | INTRAVENOUS | Status: AC
  Administered 2019-02-19: 70 mg via INTRAVENOUS

## 2019-02-19 MED ORDER — ALBUTEROL SULFATE (2.5 MG/3ML) 0.083% IN NEBU
INHALATION_SOLUTION | RESPIRATORY_TRACT | Status: AC
  Filled 2019-02-19: qty 3

## 2019-02-19 MED ORDER — ALBUTEROL SULFATE (2.5 MG/3ML) 0.083% IN NEBU
INHALATION_SOLUTION | RESPIRATORY_TRACT | Status: AC
  Filled 2019-02-19: qty 9

## 2019-02-19 MED ORDER — CHLORHEXIDINE GLUCONATE CLOTH 2 % EX PADS
6.0000 | MEDICATED_PAD | Freq: Every day | CUTANEOUS | Status: DC
  Administered 2019-02-19 – 2019-02-27 (×8): 6 via TOPICAL

## 2019-02-19 NOTE — Progress Notes (Signed)
NAME:  Jeffrey [name], MRN:  [id number], DOB:  02-02-1961, LOS: 3 ADMISSION DATE:  02/16/2019, CONSULTATION DATE:  02/16/2019 REFERRING MD:  Ashley Royalty TRH, CHIEF COMPLAINT:  Shortness of breath   Brief History   58 y/o former smoker admitted with dyspnea, cough fever after a recent hospital discharge for Klebsiella pneumonia requiring intubation.  During that admission there was concern for wernicke's encephalopathy.  Denies recent travel or exposure to COVID patient.  Required intubation for worsening hypoxemia on 3/27.   Past Medical History  ETOH, Polysubstance abuse, Severe COPD with emphysema, HTN, [name], Bipolar, Depression, Chronic diastolic CHF, PAD, PUD  Significant Hospital Events   3/26 Admit 3/27 VDRF  Consults:    Procedures:  ETT 3/27 >> Lt IJ CVL 3/27 >>  Significant Diagnostic Tests:    Micro Data:  Respiratory viral panel 3/26 >> negative Influenza PCR 3/26 >> negative COVID 19 3/26 >> Blood 3/26 >> COVID 3/27 >>  Tracheal aspirate 3/27 >>  Antimicrobials:  Cefepime 3/26 >>    Interim history/subjective:  Increased peak pressure Increased wheezing, poor air movement   Objective   Blood pressure (!) 99/57, pulse 94, temperature 98.2 F (36.8 C), resp. rate (!) 22, height 5\' 10"  (1.778 m), weight 72.1 kg, SpO2 92 %.    Vent Mode: PRVC FiO2 (%):  [40 %-80 %] 40 % Set Rate:  [22 bmp] 22 bmp Vt Set:  [560 mL] 560 mL PEEP:  [8 cmH20-10 cmH20] 8 cmH20 Plateau Pressure:  [17 cmH20-29 cmH20] 29 cmH20   Intake/Output Summary (Last 24 hours) at 02/19/2019 0849 Last data filed at 02/19/2019 0800 Gross per 24 hour  Intake 2743.04 ml  Output 765 ml  Net 1978.04 ml   Filed Weights   02/16/19 2200 02/18/19 0500 02/19/19 6945  Weight: 66.7 kg 70.7 kg 72.1 kg    Examination:  General:  In bed on vent HENT: NCAT ETT in place PULM: Poor air movement B, vent supported breathing CV: RRR, no mgr GI: BS+, soft, nontender MSK: normal bulk and tone  Neuro: sedated on vent    Resolved Hospital Problem list     Assessment & Plan:  COPD exacerbation> worsening air movement on 3/29; daughter says he has severe COPD, smokes, uses cocaine > reduce respiratory rate > restart solumedrol now > add brovana/pulmicort > continuous neb now albuterol > duoneb qid  HCAP/Influenza like illness, concern for COVID 19 > f/u culture/swab results > continue cefepime  Acute respiratory failure with hypoxemia > continue full vent support > wean PEEP/FiO2 for SaO2 > 90% > VAP prevention protocol > rocuronium x2 today for severe vent dyssynchrony, plan is to continue to treat bronchospasm  HTN, [name] > ASA, lipitor, lopressor  Bipolar disorder> history of wernicke's encephalopathy/hepatic encephalopathy > continue home prozac, neurontin, zyprexa > continue sedation with prn fentanyl, versed, propofol titrated to RASS 0 to -1 > thiamine/folate  Anemia of chronic illness > Monitor for bleeding > Transfuse PRBC for Hgb < 7 gm/dL    Best practice:  Diet: tube feeding Pain/Anxiety/Delirium protocol (if indicated): yes, RASS goal 0 to -1, propofol, prn fentanyl/versed VAP protocol (if indicated): yes DVT prophylaxis: lovenox GI prophylaxis: pantoprazole Glucose control: SSI Mobility: bed rest Code Status: full Family Communication: spoke with daughter [name]  Disposition: remain in ICU  Labs   CBC: Recent Labs  Lab 02/16/19 1219 02/17/19 0621 02/18/19 0407 02/19/19 0615  WBC 12.3* 8.5 5.8 8.5  NEUTROABS 10.6* 6.3 4.9  --   HGB 12.1* 9.2*  8.8* 9.4*  HCT 38.8* 30.5* 31.7* 32.1*  MCV 86.0 90.2 95.2 93.9  PLT 691* 430* 374 373    Basic Metabolic Panel: Recent Labs  Lab 02/16/19 1219 02/17/19 0621 02/18/19 0407 02/19/19 0615  NA 135 138 143 145  K 4.6 3.9 4.5 3.8  CL 97* 107 110 110  CO2 23 22 28 29   GLUCOSE 88 146* 138* 94  BUN 17 14 22* 34*  CREATININE 0.78 0.59* 0.58* 0.48*  CALCIUM 8.9 7.9* 8.1* 8.1*  MG  --   --   2.2  --   PHOS  --   --  3.7  --    GFR: Estimated Creatinine Clearance: 102.6 mL/min (A) (by C-G formula based on SCr of 0.48 mg/dL (L)). Recent Labs  Lab 02/16/19 1213 02/16/19 1219 02/16/19 1412 02/16/19 1419 02/16/19 1610 02/17/19 0621 02/18/19 0407 02/19/19 0615  PROCALCITON  --   --   --  0.19  --   --   --   --   WBC  --  12.3*  --   --   --  8.5 5.8 8.5  LATICACIDVEN 2.6*  --  3.4*  --  3.9*  --   --   --     Liver Function Tests: Recent Labs  Lab 02/16/19 1219 02/17/19 0621 02/18/19 0407  AST 182* 101* 59*  ALT 130* 104* 89*  ALKPHOS 420* 248* 336*  BILITOT 0.6 0.6 0.4  PROT 7.5 5.7* 5.3*  ALBUMIN 2.8* 2.1* 2.0*   Recent Labs  Lab 02/16/19 1219  LIPASE 17   No results for input(s): AMMONIA in the last 168 hours.  ABG    Component Value Date/Time   PHART 7.330 (L) 02/17/2019 1850   PCO2ART 48.8 (H) 02/17/2019 1850   PO2ART 87.4 02/17/2019 1850   HCO3 25.0 02/17/2019 1850   TCO2 34 (H) 12/21/2018 0357   ACIDBASEDEF 0.6 02/17/2019 1850   O2SAT 95.0 02/17/2019 1850     Coagulation Profile: Recent Labs  Lab 02/16/19 1219  INR 1.2    Cardiac Enzymes: No results for input(s): CKTOTAL, CKMB, CKMBINDEX, TROPONINI in the last 168 hours.  HbA1C: No results found for: HGBA1C  CBG: Recent Labs  Lab 02/18/19 1233 02/18/19 1657 02/18/19 2042 02/19/19 0451 02/19/19 0808  GLUCAP 135* 155* 123* 105* 116*     Critical care time: 33 minutes    Heber [place], MD [place] PCCM Pager: [phone] Cell: [phone] If no response, call [phone]

## 2019-02-19 NOTE — Progress Notes (Signed)
Initial Nutrition Assessment  INTERVENTION:   -D/c Vital HP & Prostat orders  -Initiate Vital AF 1.2 @ 60 ml/hr via OGT. -Goal rate +currnet Propofol infusion provides 1992 kcal, 108g protein and 1167 ml H2O.  NUTRITION DIAGNOSIS:   Inadequate oral intake related to inability to eat as evidenced by NPO status.  GOAL:   Patient will meet greater than or equal to 90% of their needs  MONITOR:   Vent status, Labs, Weight trends, I & O's, TF tolerance  REASON FOR ASSESSMENT:   Consult Enteral/tube feeding initiation and management  ASSESSMENT:   58 y/o former smoker admitted with dyspnea, cough fever after a recent hospital discharge for Klebsiella pneumonia requiring intubation. Required intubation for worsening hypoxemia on 3/27.  **RD working remotely**  Per chart review, pt is being tested for COVID-19, results are pending.  Patient is currently intubated on ventilator support d/t worsened hypoxemia. MV: 15.5 L/min Temp (24hrs), Avg:97.7 F (36.5 C), Min:97 F (36.1 C), Max:98.6 F (37 C)  Propofol: 10.01 ml/hr -provides 264 kcal  Patient has been intubated since 3/27. Currently NPO with OGT. TF protocol was initiated following intubation and pt has been receiving Vital HP @ 40 ml/hr w/ 30 ml Prostat BID (providing 1160 kcal, 114g protein). To better meet estimated needs, will switch TF formula to Vital AF 1.2, goal rate of 60 ml/hr. D/c Prostat.  Per chart review, pt was previously admitted to [facility] from 1/24 -2/21. During that admission, nutrition team followed. Pt was intubated, started on TF and then was discharged on dysphagia diet.   Per weight records, pt's weight remained ~140 lb until January 2020 when he began to have weight fluctuations. Currently pt with increased weight from admission weight of 147 lb.   Labs reviewed: CBGs: 116-118 Medications: folic acid daily, Multivitamin with minerals daily, Thiamine tablet daily  NUTRITION - FOCUSED PHYSICAL  EXAM:  Unable to perform per department requirement to work remotely.  Diet Order:   Diet Order            Diet NPO time specified  Diet effective now              EDUCATION NEEDS:   Not appropriate for education at this time  Skin:  Skin Assessment: Reviewed RN Assessment  Last BM:  3/27  Height:   Ht Readings from Last 1 Encounters:  02/16/19 5\' 10"  (1.778 m)    Weight:   Wt Readings from Last 1 Encounters:  02/19/19 72.1 kg    Ideal Body Weight:  75.5 kg  BMI:  Body mass index is 22.81 kg/m.  Estimated Nutritional Needs:   Kcal:  1884  Protein:  100-110g  Fluid:  1.8L/day  Tilda Franco, MS, RD, LDN [facility] Inpatient Clinical Dietitian Pager: [phone] After Hours Pager: [phone]

## 2019-02-20 LAB — NOVEL CORONAVIRUS, NAA (HOSP ORDER, SEND-OUT TO REF LAB; TAT 18-24 HRS): SARS-CoV-2, NAA: NOT DETECTED

## 2019-02-20 LAB — TRIGLYCERIDES: Triglycerides: 86 mg/dL (ref <150)

## 2019-02-20 LAB — BASIC METABOLIC PANEL
Anion gap: 6 (ref 5–15)
BUN: 25 mg/dL — ABNORMAL HIGH (ref 6–20)
CO2: 29 mmol/L (ref 22–32)
Calcium: 8 mg/dL — ABNORMAL LOW (ref 8.9–10.3)
Chloride: 109 mmol/L (ref 98–111)
Creatinine, Ser: 0.44 mg/dL — ABNORMAL LOW (ref 0.61–1.24)
GFR calc Af Amer: >60 mL/min (ref >60)
GFR calc non Af Amer: >60 mL/min (ref >60)
Glucose, Bld: 101 mg/dL — ABNORMAL HIGH (ref 70–99)
Potassium: 3.9 mmol/L (ref 3.5–5.1)
Sodium: 144 mmol/L (ref 135–145)

## 2019-02-20 LAB — GLUCOSE, CAPILLARY
Glucose-Capillary: 100 mg/dL — ABNORMAL HIGH (ref 70–99)
Glucose-Capillary: 92 mg/dL (ref 70–99)
Glucose-Capillary: 118 mg/dL — ABNORMAL HIGH (ref 70–99)
Glucose-Capillary: 127 mg/dL — ABNORMAL HIGH (ref 70–99)
Glucose-Capillary: 119 mg/dL — ABNORMAL HIGH (ref 70–99)
Glucose-Capillary: 137 mg/dL — ABNORMAL HIGH (ref 70–99)
Glucose-Capillary: 129 mg/dL — ABNORMAL HIGH (ref 70–99)

## 2019-02-20 LAB — CBC
HCT: 30.9 % — ABNORMAL LOW (ref 39.0–52.0)
Hemoglobin: 8.6 g/dL — ABNORMAL LOW (ref 13.0–17.0)
MCH: 26.5 pg (ref 26.0–34.0)
MCHC: 27.8 g/dL — ABNORMAL LOW (ref 30.0–36.0)
MCV: 95.4 fL (ref 80.0–100.0)
Platelets: 405 10*3/uL — ABNORMAL HIGH (ref 150–400)
RBC: 3.24 MIL/uL — ABNORMAL LOW (ref 4.22–5.81)
RDW: 14.2 % (ref 11.5–15.5)
WBC: 9.1 10*3/uL (ref 4.0–10.5)
nRBC: 0 % (ref 0.0–0.2)

## 2019-02-20 MED ORDER — FENTANYL 2500MCG IN NS 250ML (10MCG/ML) PREMIX INFUSION: 0.0000 ug/h | INTRAVENOUS | Status: DC

## 2019-02-20 MED ORDER — SODIUM CHLORIDE 0.9 % IV SOLN
0.0000 ug/h | INTRAVENOUS | Status: DC
  Administered 2019-02-20: 75 ug/h via INTRAVENOUS
  Filled 2019-02-20: qty 50

## 2019-02-20 MED ORDER — NOREPINEPHRINE BITARTRATE 1 MG/ML IV SOLN
0.0000 ug/min | INTRAVENOUS | Status: DC
  Administered 2019-02-20: 2 ug/min via INTRAVENOUS
  Administered 2019-02-21: 6 ug/min via INTRAVENOUS
  Filled 2019-02-20 (×3): qty 4

## 2019-02-20 MED ORDER — PHENYLEPHRINE HCL-NACL 10-0.9 MG/250ML-% IV SOLN
25.0000 ug/min | INTRAVENOUS | Status: DC
  Administered 2019-02-20: 50 ug/min via INTRAVENOUS
  Administered 2019-02-20: 25 ug/min via INTRAVENOUS
  Filled 2019-02-20 (×2): qty 250

## 2019-02-20 MED ORDER — SODIUM CHLORIDE 0.9 % IV SOLN
250.0000 mL | INTRAVENOUS | Status: DC
  Administered 2019-02-24 – 2019-02-28 (×3): 250 mL via INTRAVENOUS

## 2019-02-20 NOTE — Progress Notes (Signed)
Respiratory culture sent down to the lab a few days ago. Lab called this RN and said the culture did not have a label and they were unable to take it. Patient not producing a large amount of sputum for a resend. Dr. Isaiah Serge notified and no further orders were given.

## 2019-02-20 NOTE — TOC Initial Note (Signed)
Transition of Care [facility]) - Initial/Assessment Note    Patient Details  Name: Jeffrey [name] MRN: [id number] Date of Birth: 1960-12-15  Transition of Care [facility]) CM/SW Contact:    Nelwyn Salisbury, LCSW Phone Number: 02/20/2019, 12:13 PM  Clinical Narrative:    Pt admitted from home where he resides with daughter, with hypoxia currently requiring intubation. Recent hospitalization for pneumonia.  Completed High Risk Readmission assessment (See below). Please consult TOC team as post DC care needs arise.                                                   Prior Living Arrangements/Services    Home/self care O2                   Activities of Daily Living Home Assistive Devices/Equipment: Oxygen ADL Screening (condition at time of admission) Patient's cognitive ability adequate to safely complete daily activities?: Yes Is the patient deaf or have difficulty hearing?: No Does the patient have difficulty seeing, even when wearing glasses/contacts?: No Does the patient have difficulty concentrating, remembering, or making decisions?: No Patient able to express need for assistance with ADLs?: Yes Does the patient have difficulty dressing or bathing?: No Independently performs ADLs?: Yes (appropriate for developmental age) Does the patient have difficulty walking or climbing stairs?: No Weakness of Legs: None Weakness of Arms/Hands: None            Admission diagnosis:  Multifocal pneumonia [J18.9] Patient Active Problem List   Diagnosis Date Noted  . Sepsis due to undetermined organism (HCC) 02/16/2019  . Acute respiratory failure with hypoxia (HCC) 02/16/2019  . Interstitial pneumonia (HCC) 02/16/2019  . Cough   . Dysphagia   . Palliative care by specialist   . Goals of care, counseling/discussion   . Wernicke encephalopathy syndrome 01/07/2019  . Toxic encephalopathy 12/16/2018  . Acute hepatic encephalopathy 12/16/2018  . Respiratory failure (HCC)  12/16/2018  . Hepatic encephalopathy (HCC)   . Acute respiratory failure with hypoxemia (HCC)   . Alcohol withdrawal syndrome, with delirium (HCC)   . COPD exacerbation (HCC) 09/16/2018  . Essential hypertension 09/16/2018  . GERD (gastroesophageal reflux disease) 09/16/2018  . Hypoxia   . Chronic diastolic CHF (congestive heart failure) (HCC)   . Bipolar disorder with severe depression (HCC) 02/01/2018  . Alcohol use disorder, severe, dependence (HCC) 02/01/2018  . Cannabis use disorder, severe, dependence (HCC) 02/01/2018  . MDD (major depressive disorder), recurrent episode, severe (HCC) 01/31/2018  . Internal and external bleeding hemorrhoids   . Rectal bleeding   . Iron deficiency anemia due to chronic blood loss 03/09/2017  . Anemia   . GIB (gastrointestinal bleeding) 02/07/2017  . Acute blood loss anemia 02/07/2017  . Weakness generalized 02/07/2017  . Dizziness 02/07/2017  . Peptic ulcer disease   . Bipolar disorder (HCC)   . 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 Dyspnea 08/26/2016  . H/O: depression 08/26/2016  . Dyslipidemia 08/26/2016  . Tobacco use disorder 08/26/2016  . [name] (peripheral vascular disease) (HCC) 06/18/2015  . Chronic diarrhea 05/02/2013  . Hematochezia 05/02/2013  . Abnormal weight loss 05/02/2013  . Abdominal pain, periumbilical 05/02/2013   PCP:  Gareth Morgan, MD Pharmacy:   [facility] [address], [place] - [address] [place] [address] [place] [address] Phone: [phone] Fax: [phone]  Social Determinants of Health (SDOH) Interventions    Readmission Risk Interventions Readmission Risk Prevention Plan 02/20/2019  Transportation Screening Complete  Medication Review Oceanographer) Not Complete  PCP or Specialist appointment within 3-5 days of discharge Complete  HRI or Home Care Consult (No Data)  SW Recovery Care/Counseling Consult (No Data)  Palliative Care Screening Not Applicable  Skilled Nursing  Facility Not Applicable  Some recent data might be hidden

## 2019-02-20 NOTE — Progress Notes (Signed)
NAME:  Jeffrey [name], MRN:  [id number], DOB:  March 04, 1961, LOS: 4 ADMISSION DATE:  02/16/2019, CONSULTATION DATE:  02/16/2019 REFERRING MD:  Ashley Royalty TRH, CHIEF COMPLAINT:  Shortness of breath   Brief History   58 y/o former smoker admitted with dyspnea, cough fever after a recent hospital discharge for Klebsiella pneumonia requiring intubation.  During that admission there was concern for wernicke's encephalopathy.  Denies recent travel or exposure to COVID patient.  Required intubation for worsening hypoxemia on 3/27.   Past Medical History  ETOH, Polysubstance abuse, Severe COPD with emphysema, HTN, [name], Bipolar, Depression, Chronic diastolic CHF, PAD, PUD  Significant Hospital Events   3/26 Admit 3/27 VDRF  Consults:    Procedures:  ETT 3/27 >> Lt IJ CVL 3/27 >>  Significant Diagnostic Tests:    Micro Data:  Respiratory viral panel 3/26 >> negative Influenza PCR 3/26 >> negative COVID 19 3/26 >> Blood 3/26 >> COVID 3/27 >>  Tracheal aspirate 3/27 >>  Antimicrobials:  Cefepime 3/26 >>    Interim history/subjective:  Had an episode of desaturation.  FiO2 up to 80% Continues on Neo-Synephrine  Objective   Blood pressure (!) 99/48, pulse 81, temperature 97.9 F (36.6 C), resp. rate 16, height 5\' 10"  (1.778 m), weight 72.9 kg, SpO2 95 %.    Vent Mode: PRVC FiO2 (%):  [45 %-90 %] 80 % Set Rate:  [14 bmp-22 bmp] 14 bmp Vt Set:  [560 mL] 560 mL PEEP:  [8 cmH20] 8 cmH20 Plateau Pressure:  [25 cmH20-38 cmH20] 25 cmH20   Intake/Output Summary (Last 24 hours) at 02/20/2019 0955 Last data filed at 02/20/2019 0900 Gross per 24 hour  Intake 4208.16 ml  Output 1330 ml  Net 2878.16 ml   Filed Weights   02/18/19 0500 02/19/19 0619 02/20/19 0500  Weight: 70.7 kg 72.1 kg 72.9 kg    Examination: Gen:      No acute distress HEENT:  EOMI, sclera anicteric Neck:     No masses; no thyromegaly, ETT Lungs:    Clear to auscultation bilaterally; normal respiratory  effort CV:         Regular rate and rhythm; no murmurs Abd:      + bowel sounds; soft, non-tender; no palpable masses, no distension Ext:    No edema; adequate peripheral perfusion Skin:      Warm and dry; no rash Neuro: Sedated, [facility] Problem list     Assessment & Plan:  58 year old with HCAP, acute exacerbation of COPD, rule out Covid 19  Resp failure COPD exacerbation Continue vent support On Solu-Medrol Brovana/Pulmicort  HCAP, flulike illness Awaiting testing for COVID-19 Continue cefepime  Hypotension Currently on Neo-Synephrine.  Will change this to Levophed  Bipolar disorder> history of wernicke's encephalopathy/hepatic encephalopathy Continue home prozac, neurontin, zyprexa Continue sedation with prn fentanyl, versed, propofol titrated to RASS 0 to -1 Thiamine/folate  Anemia of chronic illness Monitor for bleeding Transfuse PRBC for Hgb < 7 gm/dL   Best practice:  Diet: tube feeding Pain/Anxiety/Delirium protocol (if indicated): yes, RASS goal 0 to -1, propofol, prn fentanyl/versed VAP protocol (if indicated): yes DVT prophylaxis: lovenox GI prophylaxis: pantoprazole Glucose control: SSI Mobility: bed rest Code Status: full Family Communication: spoke with daughter [name]  Disposition: remain in ICU  Labs   CBC: Recent Labs  Lab 02/16/19 1219 02/17/19 0621 02/18/19 0407 02/19/19 0615 02/20/19 0617  WBC 12.3* 8.5 5.8 8.5 9.1  NEUTROABS 10.6* 6.3 4.9  --   --   HGB  12.1* 9.2* 8.8* 9.4* 8.6*  HCT 38.8* 30.5* 31.7* 32.1* 30.9*  MCV 86.0 90.2 95.2 93.9 95.4  PLT 691* 430* 374 373 405*    Basic Metabolic Panel: Recent Labs  Lab 02/16/19 1219 02/17/19 0621 02/18/19 0407 02/19/19 0615 02/20/19 0617  NA 135 138 143 145 144  K 4.6 3.9 4.5 3.8 3.9  CL 97* 107 110 110 109  CO2 23 22 28 29 29   GLUCOSE 88 146* 138* 94 101*  BUN 17 14 22* 34* 25*  CREATININE 0.78 0.59* 0.58* 0.48* 0.44*  CALCIUM 8.9 7.9* 8.1* 8.1*  8.0*  MG  --   --  2.2  --   --   PHOS  --   --  3.7  --   --    GFR: Estimated Creatinine Clearance: 103.8 mL/min (A) (by C-G formula based on SCr of 0.44 mg/dL (L)). Recent Labs  Lab 02/16/19 1213  02/16/19 1412 02/16/19 1419 02/16/19 1610 02/17/19 0621 02/18/19 0407 02/19/19 0615 02/20/19 0617  PROCALCITON  --   --   --  0.19  --   --   --   --   --   WBC  --    < >  --   --   --  8.5 5.8 8.5 9.1  LATICACIDVEN 2.6*  --  3.4*  --  3.9*  --   --   --   --    < > = values in this interval not displayed.    Liver Function Tests: Recent Labs  Lab 02/16/19 1219 02/17/19 0621 02/18/19 0407  AST 182* 101* 59*  ALT 130* 104* 89*  ALKPHOS 420* 248* 336*  BILITOT 0.6 0.6 0.4  PROT 7.5 5.7* 5.3*  ALBUMIN 2.8* 2.1* 2.0*   Recent Labs  Lab 02/16/19 1219  LIPASE 17   No results for input(s): AMMONIA in the last 168 hours.  ABG    Component Value Date/Time   PHART 7.330 (L) 02/17/2019 1850   PCO2ART 48.8 (H) 02/17/2019 1850   PO2ART 87.4 02/17/2019 1850   HCO3 25.0 02/17/2019 1850   TCO2 34 (H) 12/21/2018 0357   ACIDBASEDEF 0.6 02/17/2019 1850   O2SAT 95.0 02/17/2019 1850     Coagulation Profile: Recent Labs  Lab 02/16/19 1219  INR 1.2    Cardiac Enzymes: No results for input(s): CKTOTAL, CKMB, CKMBINDEX, TROPONINI in the last 168 hours.  HbA1C: No results found for: HGBA1C  CBG: Recent Labs  Lab 02/19/19 1546 02/19/19 1952 02/20/19 0044 02/20/19 0413 02/20/19 0804  GLUCAP 125* 127* 118* 100* 92    The patient is critically ill with multiple organ system failure and requires high complexity decision making for assessment and support, frequent evaluation and titration of therapies, advanced monitoring, review of radiographic studies and interpretation of complex data.   Critical Care Time devoted to patient care services, exclusive of separately billable procedures, described in this note is 35 minutes.   Chilton Greathouse MD [facility] Pager [phone] If no answer call [phone] 02/20/2019, 10:08 AM

## 2019-02-20 NOTE — Progress Notes (Signed)
eLink Physician-Brief Progress Note Patient Name: Jeffrey [name] DOB: [birth year] MRN: [id number]   Date of Service  02/20/2019  HPI/Events of Note  Notified of borderline BP likely from sedation however cant come off sedation due to vent desynchrony. Currently on propofol 20 and Fentanyl turned off  eICU Interventions  Ordered norepineprhine     Intervention Category Major Interventions: Hypotension - evaluation and management  Darl Pikes 02/20/2019, 12:24 AM

## 2019-02-20 NOTE — Progress Notes (Signed)
Called and paged infection prevention to find out the status of the COVID results today. The results were supposed to be known within 24 hours. The sample was sent on 02/17/19 at 1500. No response from infection prevention. Will continue to follow.

## 2019-02-21 LAB — NOVEL CORONAVIRUS, NAA (HOSP ORDER, SEND-OUT TO REF LAB; TAT 18-24 HRS): SARS-CoV-2, NAA: NOT DETECTED

## 2019-02-21 LAB — GLUCOSE, CAPILLARY
Glucose-Capillary: 106 mg/dL — ABNORMAL HIGH (ref 70–99)
Glucose-Capillary: 114 mg/dL — ABNORMAL HIGH (ref 70–99)
Glucose-Capillary: 130 mg/dL — ABNORMAL HIGH (ref 70–99)
Glucose-Capillary: 132 mg/dL — ABNORMAL HIGH (ref 70–99)

## 2019-02-21 LAB — CULTURE, BLOOD (ROUTINE X 2)
Culture: NO GROWTH
Culture: NO GROWTH
Special Requests: ADEQUATE
Special Requests: ADEQUATE

## 2019-02-21 MED ORDER — FENTANYL 2500MCG IN NS 250ML (10MCG/ML) PREMIX INFUSION
0.0000 ug/h | INTRAVENOUS | Status: DC
  Administered 2019-02-21: 175 ug/h via INTRAVENOUS
  Administered 2019-02-22 – 2019-02-24 (×3): 125 ug/h via INTRAVENOUS
  Filled 2019-02-21 (×4): qty 250

## 2019-02-21 NOTE — Progress Notes (Signed)
NAME:  Jeffrey [name], MRN:  [id number], DOB:  02/02/1961, LOS: 5 ADMISSION DATE:  02/16/2019, CONSULTATION DATE:  02/16/2019 REFERRING MD:  Ashley Royalty TRH, CHIEF COMPLAINT:  Shortness of breath   Brief History   58 y/o former smoker admitted with dyspnea, cough fever after a recent hospital discharge for Klebsiella pneumonia requiring intubation.  During that admission there was concern for wernicke's encephalopathy.  Denies recent travel or exposure to COVID patient.  Required intubation for worsening hypoxemia on 3/27.   Past Medical History  ETOH, Polysubstance abuse, Severe COPD with emphysema, HTN, [name], Bipolar, Depression, Chronic diastolic CHF, PAD, PUD  Significant Hospital Events   3/26 Admit 3/27 VDRF  Consults:    Procedures:  ETT 3/27 >> Lt IJ CVL 3/27 >>  Significant Diagnostic Tests:    Micro Data:  Respiratory viral panel 3/26 >> negative Influenza PCR 3/26 >> negative COVID 19 3/26 >> Blood 3/26 >> COVID 3/27 >>  Tracheal aspirate 3/27 >>  Antimicrobials:  Cefepime 3/26 >>    Interim history/subjective:  Stable overnight.  Neo-Synephrine changed to Levophed Continues on a stable pressor dose of 8 mcg  Objective   Blood pressure (!) 111/54, pulse 77, temperature 98.8 F (37.1 C), resp. rate (!) 24, height 5\' 10"  (1.778 m), weight 76 kg, SpO2 98 %.    Vent Mode: PRVC FiO2 (%):  [70 %-90 %] 70 % Set Rate:  [14 bmp-24 bmp] 24 bmp Vt Set:  [430 mL] 430 mL PEEP:  [10 cmH20] 10 cmH20 Plateau Pressure:  [14 cmH20-25 cmH20] 20 cmH20   Intake/Output Summary (Last 24 hours) at 02/21/2019 0933 Last data filed at 02/21/2019 3903 Gross per 24 hour  Intake 3454.56 ml  Output 1445 ml  Net 2009.56 ml   Filed Weights   02/19/19 0619 02/20/19 0500 02/21/19 0500  Weight: 72.1 kg 72.9 kg 76 kg    Examination: Gen:      No acute distress HEENT:  EOMI, sclera anicteric Neck:     No masses; no thyromegaly, ETT Lungs:    Clear to auscultation  bilaterally; normal respiratory effort CV:         Regular rate and rhythm; no murmurs Abd:      + bowel sounds; soft, non-tender; no palpable masses, no distension Ext:    No edema; adequate peripheral perfusion Skin:      Warm and dry; no rash Neuro: Sedated, [facility] Problem list     Assessment & Plan:  58 year old with HCAP, acute exacerbation of COPD, Covid negative  Resp failure COPD exacerbation Continue vent support Wean down PEEP/FiO2 as tolerated. On Solu-Medrol Brovana/Pulmicort  HCAP, flulike illness Continue cefepime  Hypotension, suspect likely sedation related. Wean down pressors Holding metoprolol.  Bipolar disorder> history of wernicke's encephalopathy/hepatic encephalopathy Continue home prozac, neurontin, zyprexa Continue sedation with prn fentanyl, versed, propofol titrated to RASS 0 to -1 Thiamine/folate  Anemia of chronic illness Monitor for bleeding Transfuse PRBC for Hgb < 7 gm/dL   Best practice:  Diet: tube feeding Pain/Anxiety/Delirium protocol (if indicated): yes, RASS goal 0 to -1, propofol, prn fentanyl/versed VAP protocol (if indicated): yes DVT prophylaxis: lovenox GI prophylaxis: pantoprazole Glucose control: SSI Mobility: bed rest Code Status: full Family Communication: spoke with daughter [name] on phone 3/31 Disposition: remain in ICU  Labs   CBC: Recent Labs  Lab 02/16/19 1219 02/17/19 0621 02/18/19 0407 02/19/19 0615 02/20/19 0617  WBC 12.3* 8.5 5.8 8.5 9.1  NEUTROABS 10.6* 6.3 4.9  --   --  HGB 12.1* 9.2* 8.8* 9.4* 8.6*  HCT 38.8* 30.5* 31.7* 32.1* 30.9*  MCV 86.0 90.2 95.2 93.9 95.4  PLT 691* 430* 374 373 405*    Basic Metabolic Panel: Recent Labs  Lab 02/16/19 1219 02/17/19 0621 02/18/19 0407 02/19/19 0615 02/20/19 0617  NA 135 138 143 145 144  K 4.6 3.9 4.5 3.8 3.9  CL 97* 107 110 110 109  CO2 23 22 28 29 29   GLUCOSE 88 146* 138* 94 101*  BUN 17 14 22* 34* 25*  CREATININE  0.78 0.59* 0.58* 0.48* 0.44*  CALCIUM 8.9 7.9* 8.1* 8.1* 8.0*  MG  --   --  2.2  --   --   PHOS  --   --  3.7  --   --    GFR: Estimated Creatinine Clearance: 103.9 mL/min (A) (by C-G formula based on SCr of 0.44 mg/dL (L)). Recent Labs  Lab 02/16/19 1213  02/16/19 1412 02/16/19 1419 02/16/19 1610 02/17/19 0621 02/18/19 0407 02/19/19 0615 02/20/19 0617  PROCALCITON  --   --   --  0.19  --   --   --   --   --   WBC  --    < >  --   --   --  8.5 5.8 8.5 9.1  LATICACIDVEN 2.6*  --  3.4*  --  3.9*  --   --   --   --    < > = values in this interval not displayed.    Liver Function Tests: Recent Labs  Lab 02/16/19 1219 02/17/19 0621 02/18/19 0407  AST 182* 101* 59*  ALT 130* 104* 89*  ALKPHOS 420* 248* 336*  BILITOT 0.6 0.6 0.4  PROT 7.5 5.7* 5.3*  ALBUMIN 2.8* 2.1* 2.0*   Recent Labs  Lab 02/16/19 1219  LIPASE 17   No results for input(s): AMMONIA in the last 168 hours.  ABG    Component Value Date/Time   PHART 7.330 (L) 02/17/2019 1850   PCO2ART 48.8 (H) 02/17/2019 1850   PO2ART 87.4 02/17/2019 1850   HCO3 25.0 02/17/2019 1850   TCO2 34 (H) 12/21/2018 0357   ACIDBASEDEF 0.6 02/17/2019 1850   O2SAT 95.0 02/17/2019 1850     Coagulation Profile: Recent Labs  Lab 02/16/19 1219  INR 1.2    Cardiac Enzymes: No results for input(s): CKTOTAL, CKMB, CKMBINDEX, TROPONINI in the last 168 hours.  HbA1C: No results found for: HGBA1C  CBG: Recent Labs  Lab 02/20/19 0413 02/20/19 0804 02/20/19 1153 02/20/19 1558 02/20/19 2309  GLUCAP 100* 92 119* 137* 129*    The patient is critically ill with multiple organ system failure and requires high complexity decision making for assessment and support, frequent evaluation and titration of therapies, advanced monitoring, review of radiographic studies and interpretation of complex data.   Critical Care Time devoted to patient care services, exclusive of separately billable procedures, described in this note is 35   minutes.   Chilton Greathouse MD [facility] Pager [phone] If no answer call [phone] 02/21/2019, 9:42 AM

## 2019-02-21 NOTE — Progress Notes (Signed)
I have spoken with pt's PCP, nursing staff, reviewed patient's chart. They have a clear alternative diagnosis and COVID precautions are being d/c.

## 2019-02-22 ENCOUNTER — Inpatient Hospital Stay (HOSPITAL_COMMUNITY): Payer: Medicare Other

## 2019-02-22 LAB — BLOOD GAS, ARTERIAL
Acid-Base Excess: 9 mmol/L — ABNORMAL HIGH (ref 0.0–2.0)
Bicarbonate: 34.8 mmol/L — ABNORMAL HIGH (ref 20.0–28.0)
Drawn by: 235321
FIO2: 40
MECHVT: 430 mL
O2 Saturation: 85.2 %
PEEP: 10 cmH2O
Patient temperature: 36.4
RATE: 24 resp/min
pCO2 arterial: 56.5 mmHg — ABNORMAL HIGH (ref 32.0–48.0)
pH, Arterial: 7.402 (ref 7.350–7.450)
pO2, Arterial: 51.9 mmHg — ABNORMAL LOW (ref 83.0–108.0)

## 2019-02-22 LAB — BASIC METABOLIC PANEL
Anion gap: 4 — ABNORMAL LOW (ref 5–15)
BUN: 19 mg/dL (ref 6–20)
CO2: 34 mmol/L — ABNORMAL HIGH (ref 22–32)
Calcium: 8.4 mg/dL — ABNORMAL LOW (ref 8.9–10.3)
Chloride: 104 mmol/L (ref 98–111)
Creatinine, Ser: 0.45 mg/dL — ABNORMAL LOW (ref 0.61–1.24)
GFR calc Af Amer: >60 mL/min (ref >60)
GFR calc non Af Amer: >60 mL/min (ref >60)
Glucose, Bld: 108 mg/dL — ABNORMAL HIGH (ref 70–99)
Potassium: 3.8 mmol/L (ref 3.5–5.1)
Sodium: 142 mmol/L (ref 135–145)

## 2019-02-22 LAB — CBC
HCT: 32.1 % — ABNORMAL LOW (ref 39.0–52.0)
Hemoglobin: 9.4 g/dL — ABNORMAL LOW (ref 13.0–17.0)
MCH: 27.6 pg (ref 26.0–34.0)
MCHC: 29.3 g/dL — ABNORMAL LOW (ref 30.0–36.0)
MCV: 94.4 fL (ref 80.0–100.0)
Platelets: 436 10*3/uL — ABNORMAL HIGH (ref 150–400)
RBC: 3.4 MIL/uL — ABNORMAL LOW (ref 4.22–5.81)
RDW: 16.6 % — ABNORMAL HIGH (ref 11.5–15.5)
WBC: 7.8 10*3/uL (ref 4.0–10.5)
nRBC: 0 % (ref 0.0–0.2)

## 2019-02-22 LAB — GLUCOSE, CAPILLARY
Glucose-Capillary: 117 mg/dL — ABNORMAL HIGH (ref 70–99)
Glucose-Capillary: 93 mg/dL (ref 70–99)
Glucose-Capillary: 103 mg/dL — ABNORMAL HIGH (ref 70–99)
Glucose-Capillary: 131 mg/dL — ABNORMAL HIGH (ref 70–99)
Glucose-Capillary: 108 mg/dL — ABNORMAL HIGH (ref 70–99)

## 2019-02-22 LAB — PHOSPHORUS: Phosphorus: 3.5 mg/dL (ref 2.5–4.6)

## 2019-02-22 LAB — MAGNESIUM: Magnesium: 2.3 mg/dL (ref 1.7–2.4)

## 2019-02-22 MED ORDER — VITAL AF 1.2 CAL PO LIQD
1000.0000 mL | ORAL | Status: DC
  Administered 2019-02-22 – 2019-02-24 (×3): 1000 mL

## 2019-02-22 MED ORDER — NOREPINEPHRINE BITARTRATE 1 MG/ML IV SOLN
0.0000 ug/min | INTRAVENOUS | Status: DC
  Administered 2019-02-22: 3 ug/min via INTRAVENOUS
  Filled 2019-02-22: qty 4

## 2019-02-22 MED ORDER — ADULT MULTIVITAMIN LIQUID CH
15.0000 mL | Freq: Every day | ORAL | Status: DC
  Administered 2019-02-23 – 2019-02-24 (×2): 15 mL
  Filled 2019-02-22 (×4): qty 15

## 2019-02-22 NOTE — Progress Notes (Signed)
eLink Physician-Brief Progress Note Patient Name: Jeffrey [name] DOB: 21-May-1961 MRN: [id number]   Date of Service  02/22/2019  HPI/Events of Note  Agitation - Request to renew bilateral soft wrist restraints.   eICU Interventions  Will renew bilateral soft wrist restraints.      Intervention Category Major Interventions: Delirium, psychosis, severe agitation - evaluation and management  [name] 02/22/2019, 12:05 AM

## 2019-02-22 NOTE — Progress Notes (Signed)
Nutrition Follow-up  RD working remotely.   DOCUMENTATION CODES:   Not applicable  INTERVENTION:  - Will decrease Vital AF 1.2 to 55 ml/hr. This rate + kcal from current propofol rate will provide 1848 kcal, 99 grams of protein, and 1070 ml free water. - Free water flush, if desired, to be per MD/NP.  - Will change order for multivitamin to liquid form.   NUTRITION DIAGNOSIS:   Inadequate oral intake related to inability to eat as evidenced by NPO status. -ongoing  GOAL:   Patient will meet greater than or equal to 90% of their needs -met with TF regimen  MONITOR:   Vent status, TF tolerance, Labs, Weight trends  ASSESSMENT:   58 y/o former smoker admitted with dyspnea, cough fever after a recent hospital discharge for Klebsiella pneumonia requiring intubation. Required intubation for worsening hypoxemia on 3/27.  Weight trending significantly up since admission; used admission weight (66.7) to re-estimate kcal need this AM. Patient remains intubated with OGT in place and is receiving Vital AF 1.2 @ 60 ml/hr. This regimen provides 1728 kcal (93% re-estimated kcal need), 108 grams protein, and 1168 ml free water.   Per Dr. Matilde Bash note yesterday AM: COPD exacerbation with ongoing vent support, HCAP, hypotension, bipolar disorder with hx of wernicke's encephalopathy/hepatic encephalopathy, anemia of chronic illness.    Patient is currently intubated on ventilator support MV: 10.2 L/min (as of 8 AM) Temp (24hrs), Avg:98.1 F (36.7 C), Min:97.3 F (36.3 C), Max:100 F (37.8 C) Propofol: 10 ml/hr (264 kcal) BP: 106/52 and MAP: 70 (as of 8 AM)  Medications reviewed; 1 mg folvite/day, 40 mg solu-medrol/day, daily multivitamin with minerals, 100 mg thiamine per OGT/day.  Labs reviewed; CBGs: 117 and 93 mg/dl today, craetinine: 0.45 mg/dl, Ca: 8.4 mg/dl.  IVF; NS @ 75 ml/hr.  Drips as of 8 AM; propofol @ 25 mcg/kg/min, fentanyl @ 125 mcg/hr, levo @ 2 mcg/min.    Diet Order:    Diet Order            Diet NPO time specified  Diet effective now              EDUCATION NEEDS:   Not appropriate for education at this time  Skin:  Skin Assessment: Reviewed RN Assessment  Last BM:  3/30  Height:   Ht Readings from Last 1 Encounters:  02/16/19 5' 10"  (1.778 m)    Weight:   Wt Readings from Last 1 Encounters:  02/22/19 75.5 kg    Ideal Body Weight:  75.5 kg  BMI:  Body mass index is 23.88 kg/m.  Estimated Nutritional Needs:   Kcal:  1854 kcal  Protein:  100-110g  Fluid:  1.8L/day     Jarome Matin, MS, RD, LDN, CNSC Inpatient Clinical Dietitian Pager # [phone] After hours/weekend pager # [phone]

## 2019-02-22 NOTE — Progress Notes (Signed)
Assisted in teleconference with patient, patient's daughter, and bedside RN and staff.

## 2019-02-22 NOTE — Progress Notes (Signed)
NAME:  Jeffrey [name], MRN:  [id number], DOB:  23-Aug-1961, LOS: 6 ADMISSION DATE:  02/16/2019, CONSULTATION DATE:  02/16/2019 REFERRING MD:  Ashley Royalty TRH, CHIEF COMPLAINT:  Shortness of breath   Brief History   58 y/o former smoker admitted with dyspnea, cough fever after a recent hospital discharge for Klebsiella pneumonia requiring intubation.  During that admission there was concern for wernicke's encephalopathy.  Denies recent travel or exposure to COVID patient.  Required intubation for worsening hypoxemia on 3/27.  Past Medical History  ETOH, Polysubstance abuse, Severe COPD with emphysema, HTN, [name], Bipolar, Depression, Chronic diastolic CHF, PAD, PUD  Significant Hospital Events   3/26 Admit 3/27 VDRF  Consults:    Procedures:  ETT 3/27 >> Lt IJ CVL 3/27 >>  Significant Diagnostic Tests:    Micro Data:  Respiratory viral panel 3/26 >> negative Influenza PCR 3/26 >> negative Blood 3/26 >> COVID 3/27 >> Negative Tracheal aspirate 3/27 >>  Antimicrobials:  Cefepime 3/26 >>   Interim history/subjective:  Stable overnight.   Off pressors today AM PEEP is weaning down  Objective   Blood pressure (!) 107/48, pulse 95, temperature 99.3 F (37.4 C), resp. rate (!) 24, height 5\' 10"  (1.778 m), weight 75.5 kg, SpO2 91 %.    Vent Mode: PRVC FiO2 (%):  [40 %-55 %] 40 % Set Rate:  [24 bmp] 24 bmp Vt Set:  [430 mL-460 mL] 430 mL PEEP:  [10 cmH20] 10 cmH20 Plateau Pressure:  [17 cmH20-26 cmH20] 26 cmH20   Intake/Output Summary (Last 24 hours) at 02/22/2019 1046 Last data filed at 02/22/2019 1000 Gross per 24 hour  Intake 5721.54 ml  Output 3100 ml  Net 2621.54 ml   Filed Weights   02/20/19 0500 02/21/19 0500 02/22/19 0500  Weight: 72.9 kg 76 kg 75.5 kg   Examination: Blood pressure (!) 107/48, pulse 95, temperature 99.3 F (37.4 C), resp. rate (!) 24, height 5\' 10"  (1.778 m), weight 75.5 kg, SpO2 91 %. Gen:      No acute distress HEENT:  EOMI, sclera  anicteric Neck:     No masses; no thyromegaly, ETT Lungs:    Clear to auscultation bilaterally; normal respiratory effort CV:         Regular rate and rhythm; no murmurs Abd:      + bowel sounds; soft, non-tender; no palpable masses, no distension Ext:    No edema; adequate peripheral perfusion Skin:      Warm and dry; no rash Neuro: Sedated, arousable  Resolved Hospital Problem list     Assessment & Plan:  58 year old with HCAP, acute exacerbation of COPD, Covid negative  Resp failure COPD exacerbation Continue vent support Wean down PEEP. Start PSV weans when PEEP at 5 On Solu-Medrol Brovana/Pulmicort  HCAP, flulike illness Continue cefepime for total 7 days  Hypotension, suspect likely sedation related. Off pressors Resume metoprolol.  Bipolar disorder> history of wernicke's encephalopathy/hepatic encephalopathy Continue home prozac, neurontin, zyprexa Wean down sedation Thiamine/folate  Anemia of chronic illness Monitor for bleeding Transfuse PRBC for Hgb < 7 gm/dL   Best practice:  Diet: tube feeding Pain/Anxiety/Delirium protocol (if indicated): yes, RASS goal 0 to -1, propofol, prn fentanyl/versed VAP protocol (if indicated): yes DVT prophylaxis: lovenox GI prophylaxis: pantoprazole Glucose control: NA Mobility: Bed rest Code Status:Full Family Communication: Spoke with daughter [name] on phone 3/31. Got voice mail 4/1 Disposition: Remain in ICU  Labs   CBC: Recent Labs  Lab 02/16/19 1219 02/17/19 2395 02/18/19 0407 02/19/19 0615 02/20/19  0617 02/22/19 0500  WBC 12.3* 8.5 5.8 8.5 9.1 7.8  NEUTROABS 10.6* 6.3 4.9  --   --   --   HGB 12.1* 9.2* 8.8* 9.4* 8.6* 9.4*  HCT 38.8* 30.5* 31.7* 32.1* 30.9* 32.1*  MCV 86.0 90.2 95.2 93.9 95.4 94.4  PLT 691* 430* 374 373 405* 436*    Basic Metabolic Panel: Recent Labs  Lab 02/17/19 0621 02/18/19 0407 02/19/19 0615 02/20/19 0617 02/22/19 0547  NA 138 143 145 144 142  K 3.9 4.5 3.8 3.9 3.8  CL  107 110 110 109 104  CO2 22 28 29 29  34*  GLUCOSE 146* 138* 94 101* 108*  BUN 14 22* 34* 25* 19  CREATININE 0.59* 0.58* 0.48* 0.44* 0.45*  CALCIUM 7.9* 8.1* 8.1* 8.0* 8.4*  MG  --  2.2  --   --  2.3  PHOS  --  3.7  --   --  3.5   GFR: Estimated Creatinine Clearance: 103.9 mL/min (A) (by C-G formula based on SCr of 0.45 mg/dL (L)). Recent Labs  Lab 02/16/19 1213  02/16/19 1412 02/16/19 1419 02/16/19 1610  02/18/19 0407 02/19/19 0615 02/20/19 0617 02/22/19 0500  PROCALCITON  --   --   --  0.19  --   --   --   --   --   --   WBC  --    < >  --   --   --    < > 5.8 8.5 9.1 7.8  LATICACIDVEN 2.6*  --  3.4*  --  3.9*  --   --   --   --   --    < > = values in this interval not displayed.    Liver Function Tests: Recent Labs  Lab 02/16/19 1219 02/17/19 0621 02/18/19 0407  AST 182* 101* 59*  ALT 130* 104* 89*  ALKPHOS 420* 248* 336*  BILITOT 0.6 0.6 0.4  PROT 7.5 5.7* 5.3*  ALBUMIN 2.8* 2.1* 2.0*   Recent Labs  Lab 02/16/19 1219  LIPASE 17   No results for input(s): AMMONIA in the last 168 hours.  ABG    Component Value Date/Time   PHART 7.402 02/22/2019 0315   PCO2ART 56.5 (H) 02/22/2019 0315   PO2ART 51.9 (L) 02/22/2019 0315   HCO3 34.8 (H) 02/22/2019 0315   TCO2 34 (H) 12/21/2018 0357   ACIDBASEDEF 0.6 02/17/2019 1850   O2SAT 85.2 02/22/2019 0315     Coagulation Profile: Recent Labs  Lab 02/16/19 1219  INR 1.2    Cardiac Enzymes: No results for input(s): CKTOTAL, CKMB, CKMBINDEX, TROPONINI in the last 168 hours.  HbA1C: No results found for: HGBA1C  CBG: Recent Labs  Lab 02/21/19 1523 02/21/19 1938 02/21/19 2338 02/22/19 0331 02/22/19 0752  GLUCAP 130* 132* 114* 117* 93    The patient is critically ill with multiple organ system failure and requires high complexity decision making for assessment and support, frequent evaluation and titration of therapies, advanced monitoring, review of radiographic studies and interpretation of complex  data.   Critical Care Time devoted to patient care services, exclusive of separately billable procedures, described in this note is 35 minutes.   Chilton Greathouse MD [facility] Pager [phone] If no answer call [phone] 02/22/2019, 10:59 AM

## 2019-02-23 ENCOUNTER — Inpatient Hospital Stay (HOSPITAL_COMMUNITY): Payer: Medicare Other

## 2019-02-23 LAB — BLOOD GAS, ARTERIAL
Acid-Base Excess: 11.5 mmol/L — ABNORMAL HIGH (ref 0.0–2.0)
Bicarbonate: 37.3 mmol/L — ABNORMAL HIGH (ref 20.0–28.0)
Drawn by: 317771
FIO2: 40
MECHVT: 510 mL
O2 Saturation: 87.3 %
PEEP: 8 cmH2O
Patient temperature: 99.1
RATE: 24 resp/min
pCO2 arterial: 59.9 mmHg — ABNORMAL HIGH (ref 32.0–48.0)
pH, Arterial: 7.413 (ref 7.350–7.450)
pO2, Arterial: 59 mmHg — ABNORMAL LOW (ref 83.0–108.0)

## 2019-02-23 LAB — CBC
HCT: 33 % — ABNORMAL LOW (ref 39.0–52.0)
Hemoglobin: 9.6 g/dL — ABNORMAL LOW (ref 13.0–17.0)
MCH: 27 pg (ref 26.0–34.0)
MCHC: 29.1 g/dL — ABNORMAL LOW (ref 30.0–36.0)
MCV: 92.7 fL (ref 80.0–100.0)
Platelets: 402 10*3/uL — ABNORMAL HIGH (ref 150–400)
RBC: 3.56 MIL/uL — ABNORMAL LOW (ref 4.22–5.81)
RDW: 14 % (ref 11.5–15.5)
WBC: 9.4 10*3/uL (ref 4.0–10.5)
nRBC: 0 % (ref 0.0–0.2)

## 2019-02-23 LAB — GLUCOSE, CAPILLARY
Glucose-Capillary: 112 mg/dL — ABNORMAL HIGH (ref 70–99)
Glucose-Capillary: 136 mg/dL — ABNORMAL HIGH (ref 70–99)
Glucose-Capillary: 142 mg/dL — ABNORMAL HIGH (ref 70–99)
Glucose-Capillary: 84 mg/dL (ref 70–99)
Glucose-Capillary: 84 mg/dL (ref 70–99)
Glucose-Capillary: 88 mg/dL (ref 70–99)
Glucose-Capillary: 95 mg/dL (ref 70–99)

## 2019-02-23 LAB — BASIC METABOLIC PANEL
Anion gap: 6 (ref 5–15)
BUN: 21 mg/dL — ABNORMAL HIGH (ref 6–20)
CO2: 34 mmol/L — ABNORMAL HIGH (ref 22–32)
Calcium: 8.3 mg/dL — ABNORMAL LOW (ref 8.9–10.3)
Chloride: 99 mmol/L (ref 98–111)
Creatinine, Ser: 0.45 mg/dL — ABNORMAL LOW (ref 0.61–1.24)
GFR calc Af Amer: >60 mL/min (ref >60)
GFR calc non Af Amer: >60 mL/min (ref >60)
Glucose, Bld: 94 mg/dL (ref 70–99)
Potassium: 3.8 mmol/L (ref 3.5–5.1)
Sodium: 139 mmol/L (ref 135–145)

## 2019-02-23 LAB — MAGNESIUM: Magnesium: 2.2 mg/dL (ref 1.7–2.4)

## 2019-02-23 LAB — STREP PNEUMONIAE URINARY ANTIGEN: Strep Pneumo Urinary Antigen: NEGATIVE

## 2019-02-23 LAB — TRIGLYCERIDES: Triglycerides: 84 mg/dL (ref <150)

## 2019-02-23 LAB — PHOSPHORUS: Phosphorus: 3.4 mg/dL (ref 2.5–4.6)

## 2019-02-23 MED ORDER — POTASSIUM CHLORIDE 20 MEQ/15ML (10%) PO SOLN
40.0000 meq | Freq: Every day | ORAL | Status: AC
  Administered 2019-02-23 – 2019-02-24 (×2): 40 meq
  Filled 2019-02-23 (×2): qty 30

## 2019-02-23 MED ORDER — METOPROLOL TARTRATE 12.5 MG HALF TABLET
12.5000 mg | ORAL_TABLET | Freq: Two times a day (BID) | ORAL | Status: DC
  Administered 2019-02-24 – 2019-02-25 (×3): 12.5 mg
  Filled 2019-02-23 (×3): qty 1

## 2019-02-23 MED ORDER — METHYLPREDNISOLONE SODIUM SUCC 40 MG IJ SOLR
30.0000 mg | Freq: Every day | INTRAMUSCULAR | Status: DC
  Administered 2019-02-24: 30 mg via INTRAVENOUS
  Filled 2019-02-23: qty 1

## 2019-02-23 MED ORDER — FUROSEMIDE 10 MG/ML IJ SOLN
40.0000 mg | Freq: Every day | INTRAMUSCULAR | Status: DC
  Administered 2019-02-23: 40 mg via INTRAVENOUS
  Filled 2019-02-23: qty 4

## 2019-02-23 MED ORDER — ENOXAPARIN SODIUM 40 MG/0.4ML ~~LOC~~ SOLN
40.0000 mg | SUBCUTANEOUS | Status: DC
  Administered 2019-02-24 – 2019-03-07 (×12): 40 mg via SUBCUTANEOUS
  Filled 2019-02-23 (×12): qty 0.4

## 2019-02-23 NOTE — Progress Notes (Signed)
Assisted patient's daughter with video teleconference.

## 2019-02-23 NOTE — Progress Notes (Signed)
NAME:  Jeffrey [name], MRN:  [id number], DOB:  Aug 30, 1961, LOS: 7 ADMISSION DATE:  02/16/2019, CONSULTATION DATE:  02/16/2019 REFERRING MD:  Ashley Royalty TRH, CHIEF COMPLAINT:  Shortness of breath   Brief History   58 y/o former smoker admitted with dyspnea, cough fever after a recent hospital discharge for Klebsiella pneumonia requiring intubation.  During that admission there was concern for wernicke's encephalopathy.  Denies recent travel or exposure to COVID patient.  Required intubation for worsening hypoxemia on 3/27.  Past Medical History  ETOH, Polysubstance abuse, Severe COPD with emphysema, HTN, [name], Bipolar, Depression, Chronic diastolic CHF, PAD, PUD  Significant Hospital Events   3/26 Admit 3/27 VDRF 4/01 Off pressors, weaning PEEP   Consults:    Procedures:  ETT 3/27 >> Lt IJ CVL 3/27 >>  Significant Diagnostic Tests:    Micro Data:  Respiratory viral panel 3/26 >> negative Influenza PCR 3/26 >> negative Blood 3/26 >> negative  COVID 3/27 >> Negative Tracheal aspirate 3/27 >> canceled  Urine strep 4/2 >>   Antimicrobials:  Cefepime 3/26 >> 4/2   Interim history/subjective:  RN reports bloody secretions from ETT.  Pt had tachypnea / mild agitation during SBT.   Objective   Blood pressure (!) 116/59, pulse 86, temperature 99.7 F (37.6 C), resp. rate (!) 23, height 5\' 10"  (1.778 m), weight 76.5 kg, SpO2 94 %.    Vent Mode: PRVC FiO2 (%):  [40 %-45 %] 45 % Set Rate:  [24 bmp] 24 bmp Vt Set:  [430 mL] 430 mL PEEP:  [8 cmH20] 8 cmH20 Plateau Pressure:  [15 cmH20-22 cmH20] 15 cmH20   Intake/Output Summary (Last 24 hours) at 02/23/2019 0954 Last data filed at 02/23/2019 0532 Gross per 24 hour  Intake 2926.97 ml  Output 2260 ml  Net 666.97 ml   Filed Weights   02/21/19 0500 02/22/19 0500 02/23/19 0500  Weight: 76 kg 75.5 kg 76.5 kg   Examination: General: critically ill appearing adult male lying in bed on vent HEENT: MM pink/moist, ETT Neuro:  sedate  CV: s1s2 rrr, no m/r/g, SR 80's on monitor  PULM: even/non-labored on vent, faint crackles  EP:PIRJ, non-tender, bsx4 active  Extremities: warm/dry, trace to 1+ UE edema  Skin: no rashes or lesions  Resolved Hospital Problem list   Hypotension - sedation related   Assessment & Plan:   Acute Hypoxic Respiratory Failure -COVID, RVP negative Acute COPD Exacerbation P: PRVC 7cc  Wean PEEP / fiO2 for sats >88% Continue brovana + pulmicort  Reduce solumedrol to 30 mg IV QD Follow up ABG at 1400  Intermittent CXR Lasix 40 mg IV QD x2 days with KCL  Send urine strep antigen   Hemoptysis  -bloody secretions from ETT 4/2, small volume  P: Hold lovenox for 4/2 dosing  Restart in am 4/3 if no further bleeding   HCAP, Flu-like illness P: Cefepime, D7/7  Stop order in place for abx  Hypertension -soft normal pressures at times with sedation P: Reduce lopressor to 12.5 mg BID   Bipolar disorder -history of wernicke's encephalopathy/hepatic encephalopathy P: Continue zyprexa, neurontin, prozac  Minimize sedation as able  Continue thiamine / folate    Anemia of Chronic Illness P: Transfuse for Hgb <7% per ICU guidelines  Monitor for bleeding    Best practice:  Diet: TF Pain/Anxiety/Delirium protocol (if indicated): in place VAP protocol (if indicated): yes DVT prophylaxis: lovenox GI prophylaxis: pantoprazole Glucose control: NA Mobility: Bed rest Code Status:Full Family Communication: Daughter updated via phone  4/2.  Disposition: ICU  Labs   CBC: Recent Labs  Lab 02/16/19 1219 02/17/19 0621 02/18/19 0407 02/19/19 0615 02/20/19 0617 02/22/19 0500 02/23/19 0514  WBC 12.3* 8.5 5.8 8.5 9.1 7.8 9.4  NEUTROABS 10.6* 6.3 4.9  --   --   --   --   HGB 12.1* 9.2* 8.8* 9.4* 8.6* 9.4* 9.6*  HCT 38.8* 30.5* 31.7* 32.1* 30.9* 32.1* 33.0*  MCV 86.0 90.2 95.2 93.9 95.4 94.4 92.7  PLT 691* 430* 374 373 405* 436* 402*    Basic Metabolic Panel: Recent Labs   Lab 02/18/19 0407 02/19/19 0615 02/20/19 0617 02/22/19 0547 02/23/19 0514  NA 143 145 144 142 139  K 4.5 3.8 3.9 3.8 3.8  CL 110 110 109 104 99  CO2 28 29 29  34* 34*  GLUCOSE 138* 94 101* 108* 94  BUN 22* 34* 25* 19 21*  CREATININE 0.58* 0.48* 0.44* 0.45* 0.45*  CALCIUM 8.1* 8.1* 8.0* 8.4* 8.3*  MG 2.2  --   --  2.3 2.2  PHOS 3.7  --   --  3.5 3.4   GFR: Estimated Creatinine Clearance: 103.9 mL/min (A) (by C-G formula based on SCr of 0.45 mg/dL (L)). Recent Labs  Lab 02/16/19 1213  02/16/19 1412 02/16/19 1419 02/16/19 1610  02/19/19 0615 02/20/19 0617 02/22/19 0500 02/23/19 0514  PROCALCITON  --   --   --  0.19  --   --   --   --   --   --   WBC  --    < >  --   --   --    < > 8.5 9.1 7.8 9.4  LATICACIDVEN 2.6*  --  3.4*  --  3.9*  --   --   --   --   --    < > = values in this interval not displayed.    Liver Function Tests: Recent Labs  Lab 02/16/19 1219 02/17/19 0621 02/18/19 0407  AST 182* 101* 59*  ALT 130* 104* 89*  ALKPHOS 420* 248* 336*  BILITOT 0.6 0.6 0.4  PROT 7.5 5.7* 5.3*  ALBUMIN 2.8* 2.1* 2.0*   Recent Labs  Lab 02/16/19 1219  LIPASE 17   No results for input(s): AMMONIA in the last 168 hours.  ABG    Component Value Date/Time   PHART 7.402 02/22/2019 0315   PCO2ART 56.5 (H) 02/22/2019 0315   PO2ART 51.9 (L) 02/22/2019 0315   HCO3 34.8 (H) 02/22/2019 0315   TCO2 34 (H) 12/21/2018 0357   ACIDBASEDEF 0.6 02/17/2019 1850   O2SAT 85.2 02/22/2019 0315     Coagulation Profile: Recent Labs  Lab 02/16/19 1219  INR 1.2    Cardiac Enzymes: No results for input(s): CKTOTAL, CKMB, CKMBINDEX, TROPONINI in the last 168 hours.  HbA1C: No results found for: HGBA1C  CBG: Recent Labs  Lab 02/22/19 1613 02/22/19 2018 02/23/19 0005 02/23/19 0513 02/23/19 0743  GLUCAP 131* 108* 88 84 95   CC TIME:  35 minutes   Canary Brim, NP-C  Pulmonary & Critical Care Pgr: [phone] or if no answer [phone] 02/23/2019, 9:54 AM

## 2019-02-24 LAB — GLUCOSE, CAPILLARY
Glucose-Capillary: 107 mg/dL — ABNORMAL HIGH (ref 70–99)
Glucose-Capillary: 123 mg/dL — ABNORMAL HIGH (ref 70–99)
Glucose-Capillary: 133 mg/dL — ABNORMAL HIGH (ref 70–99)
Glucose-Capillary: 83 mg/dL (ref 70–99)
Glucose-Capillary: 90 mg/dL (ref 70–99)
Glucose-Capillary: 97 mg/dL (ref 70–99)

## 2019-02-24 LAB — BASIC METABOLIC PANEL
Anion gap: 8 (ref 5–15)
BUN: 25 mg/dL — ABNORMAL HIGH (ref 6–20)
CO2: 34 mmol/L — ABNORMAL HIGH (ref 22–32)
Calcium: 8.3 mg/dL — ABNORMAL LOW (ref 8.9–10.3)
Chloride: 98 mmol/L (ref 98–111)
Creatinine, Ser: 0.47 mg/dL — ABNORMAL LOW (ref 0.61–1.24)
GFR calc Af Amer: >60 mL/min (ref >60)
GFR calc non Af Amer: >60 mL/min (ref >60)
Glucose, Bld: 99 mg/dL (ref 70–99)
Potassium: 3.7 mmol/L (ref 3.5–5.1)
Sodium: 140 mmol/L (ref 135–145)

## 2019-02-24 LAB — CBC
HCT: 31.8 % — ABNORMAL LOW (ref 39.0–52.0)
Hemoglobin: 9.5 g/dL — ABNORMAL LOW (ref 13.0–17.0)
MCH: 27.3 pg (ref 26.0–34.0)
MCHC: 29.9 g/dL — ABNORMAL LOW (ref 30.0–36.0)
MCV: 91.4 fL (ref 80.0–100.0)
Platelets: 392 10*3/uL (ref 150–400)
RBC: 3.48 MIL/uL — ABNORMAL LOW (ref 4.22–5.81)
RDW: 14.1 % (ref 11.5–15.5)
WBC: 8.7 10*3/uL (ref 4.0–10.5)
nRBC: 0 % (ref 0.0–0.2)

## 2019-02-24 MED ORDER — FUROSEMIDE 10 MG/ML IJ SOLN
40.0000 mg | Freq: Two times a day (BID) | INTRAMUSCULAR | Status: AC
  Administered 2019-02-24 (×2): 40 mg via INTRAVENOUS
  Filled 2019-02-24 (×2): qty 4

## 2019-02-24 MED ORDER — RESOURCE THICKENUP CLEAR PO POWD
ORAL | Status: DC | PRN
  Filled 2019-02-24: qty 125

## 2019-02-24 MED ORDER — GABAPENTIN 400 MG PO CAPS
400.0000 mg | ORAL_CAPSULE | Freq: Three times a day (TID) | ORAL | Status: DC
  Administered 2019-02-24 – 2019-02-26 (×5): 400 mg via ORAL
  Filled 2019-02-24 (×5): qty 1

## 2019-02-24 MED ORDER — DEXMEDETOMIDINE HCL IN NACL 200 MCG/50ML IV SOLN
0.4000 ug/kg/h | INTRAVENOUS | Status: DC
  Administered 2019-02-24: 0.5 ug/kg/h via INTRAVENOUS
  Administered 2019-02-24 – 2019-02-25 (×2): 0.4 ug/kg/h via INTRAVENOUS
  Filled 2019-02-24 (×4): qty 50

## 2019-02-24 NOTE — Procedures (Signed)
Extubation Procedure Note  Patient Details:   Name: Jeffrey [name] DOB: 1961-07-08 MRN: [id number]   Airway Documentation:    Vent end date: (not recorded) Vent end time: (not recorded)   Evaluation  O2 sats: stable throughout Complications: No apparent complications Patient did tolerate procedure well. Bilateral Breath Sounds: Diminished, Rhonchi   Yes  Katheren Shams 02/24/2019, 12:28 PM

## 2019-02-24 NOTE — Progress Notes (Signed)
Facilitated video phone call with pt and hid daughter.

## 2019-02-24 NOTE — Progress Notes (Signed)
NAME:  Jeffrey [name], MRN:  [id number], DOB:  [birth year], LOS: 8 ADMISSION DATE:  02/16/2019, CONSULTATION DATE:  02/16/2019 REFERRING MD:  Ashley Royalty TRH, CHIEF COMPLAINT:  Shortness of breath   Brief History   58 y/o former smoker admitted with dyspnea, cough fever after a recent hospital discharge for Klebsiella pneumonia requiring intubation.  During that admission there was concern for wernicke's encephalopathy.  Denies recent travel or exposure to COVID patient.  Required intubation for worsening hypoxemia on 3/27.  Past Medical History  ETOH, Polysubstance abuse, Severe COPD with emphysema, HTN, [name], Bipolar, Depression, Chronic diastolic CHF, PAD, PUD  Significant Hospital Events   3/26 Admit 3/27 VDRF 4/01 Off pressors, weaning PEEP   Consults:    Procedures:  ETT 3/27 >> Lt IJ CVL 3/27 >>  Significant Diagnostic Tests:    Micro Data:  Respiratory viral panel 3/26 >> negative Influenza PCR 3/26 >> negative Blood 3/26 >> negative  COVID 3/27 >> Negative Tracheal aspirate 3/27 >> canceled  Urine strep 4/2 >>   Antimicrobials:  Cefepime 3/26 >> 4/2   Interim history/subjective:  Intermittent bloody secretions. On sedation. Intermittently follows commands   Objective   Blood pressure (!) 99/54, pulse 89, temperature 99.5 F (37.5 C), resp. rate (!) 21, height 5\' 10"  (1.778 m), weight 76 kg, SpO2 99 %.    Vent Mode: PRVC FiO2 (%):  [40 %-45 %] 40 % Set Rate:  [24 bmp] 24 bmp Vt Set:  [430 mL-510 mL] 510 mL PEEP:  [8 cmH20] 8 cmH20 Plateau Pressure:  [15 cmH20-25 cmH20] 25 cmH20   Intake/Output Summary (Last 24 hours) at 02/24/2019 0739 Last data filed at 02/24/2019 0700 Gross per 24 hour  Intake 3272.79 ml  Output 3705 ml  Net -432.21 ml   Filed Weights   02/22/19 0500 02/23/19 0500 02/24/19 0500  Weight: 75.5 kg 76.5 kg 76 kg    Physical Exam: General: Critically ill-appearing, no acute distress HENT: [place], AT, ETT in place Eyes: EOMI, no  scleral icterus Respiratory: Bibasilar crackles Cardiovascular: RRR, -M/R/G, no JVD GI: BS+, soft, nontender Extremities:-Edema,-tenderness Neuro: Sedated. Easily awakened and follows commands Skin: Intact, no rashes or bruising Psych: Unable to assess GU: Foley in place   Resolved Hospital Problem list   Hypotension - sedation related   Assessment & Plan:   Acute Hypoxic Respiratory Failure -COVID, RVP negative Acute COPD Exacerbation P: Full vent support SBT when sedation weaned PRVC 7cc  Wean PEEP / fiO2 for sats >88% Bronchodilators: Continue brovana + pulmicort  Soludmedrol to 30 mg IV QD Intermittent CXR Diuresis Send urine strep antigen   Hemoptysis  -bloody secretions from ETT 4/2, small volume  P: Hold lovenox for 4/2 dosing  Restart in am 4/3 if no further bleeding   HCAP, Flu-like illness P: Cefepime, D7/7  Stop order in place for abx  Hypertension -soft normal pressures at times with sedation P: Continue opressor to 12.5 mg BID   Bipolar disorder -history of wernicke's encephalopathy/hepatic encephalopathy P: Continue zyprexa, neurontin, prozac  Minimize sedation as able  Continue thiamine / folate    Anemia of Chronic Illness P: Transfuse for Hgb <7% per ICU guidelines  Monitor for bleeding    Best practice:  Diet: TF Pain/Anxiety/Delirium protocol (if indicated): in place VAP protocol (if indicated): yes DVT prophylaxis: lovenox GI prophylaxis: pantoprazole Glucose control: NA Mobility: Bed rest Code Status:Full Family Communication: Daughter updated on 4/3. Disposition: ICU  Labs   CBC: Recent Labs  Lab 02/18/19 0407  02/19/19 0615 02/20/19 0617 02/22/19 0500 02/23/19 0514 02/24/19 0311  WBC 5.8 8.5 9.1 7.8 9.4 8.7  NEUTROABS 4.9  --   --   --   --   --   HGB 8.8* 9.4* 8.6* 9.4* 9.6* 9.5*  HCT 31.7* 32.1* 30.9* 32.1* 33.0* 31.8*  MCV 95.2 93.9 95.4 94.4 92.7 91.4  PLT 374 373 405* 436* 402* 392    Basic Metabolic  Panel: Recent Labs  Lab 02/18/19 0407 02/19/19 0615 02/20/19 0617 02/22/19 0547 02/23/19 0514 02/24/19 0311  NA 143 145 144 142 139 140  K 4.5 3.8 3.9 3.8 3.8 3.7  CL 110 110 109 104 99 98  CO2 28 29 29  34* 34* 34*  GLUCOSE 138* 94 101* 108* 94 99  BUN 22* 34* 25* 19 21* 25*  CREATININE 0.58* 0.48* 0.44* 0.45* 0.45* 0.47*  CALCIUM 8.1* 8.1* 8.0* 8.4* 8.3* 8.3*  MG 2.2  --   --  2.3 2.2  --   PHOS 3.7  --   --  3.5 3.4  --    GFR: Estimated Creatinine Clearance: 103.9 mL/min (A) (by C-G formula based on SCr of 0.47 mg/dL (L)). Recent Labs  Lab 02/20/19 0617 02/22/19 0500 02/23/19 0514 02/24/19 0311  WBC 9.1 7.8 9.4 8.7    Liver Function Tests: Recent Labs  Lab 02/18/19 0407  AST 59*  ALT 89*  ALKPHOS 336*  BILITOT 0.4  PROT 5.3*  ALBUMIN 2.0*   No results for input(s): LIPASE, AMYLASE in the last 168 hours. No results for input(s): AMMONIA in the last 168 hours.  ABG    Component Value Date/Time   PHART 7.413 02/23/2019 1516   PCO2ART 59.9 (H) 02/23/2019 1516   PO2ART 59.0 (L) 02/23/2019 1516   HCO3 37.3 (H) 02/23/2019 1516   TCO2 34 (H) 12/21/2018 0357   ACIDBASEDEF 0.6 02/17/2019 1850   O2SAT 87.3 02/23/2019 1516     Coagulation Profile: No results for input(s): INR, PROTIME in the last 168 hours.  Cardiac Enzymes: No results for input(s): CKTOTAL, CKMB, CKMBINDEX, TROPONINI in the last 168 hours.  HbA1C: No results found for: HGBA1C  CBG: Recent Labs  Lab 02/23/19 1221 02/23/19 1553 02/23/19 2018 02/23/19 2331 02/24/19 0307  GLUCAP 142* 136* 112* 84 90   The patient is critically ill with multiple organ systems failure and requires high complexity decision making for assessment and support, frequent evaluation and titration of therapies, application of advanced monitoring technologies and extensive interpretation of multiple databases.   Critical Care Time: 31 minutes  Mechele Collin, M.D. [facility] Pulmonary/Critical Care Medicine  Pager: [phone] After hours pager: [phone]

## 2019-02-24 NOTE — Evaluation (Signed)
Physical Therapy Evaluation Patient Details Name: Jeffrey [name] MRN: [id number] DOB: [birth year] Today's Date: 02/24/2019   History of Present Illness  Pt admitted with COPD exacerbation 2* PNA and with hx of recent dc from hospital with Klebsiella PNA requiring intubation.  Pt also with hx of [name], COPD, bipolar, CHF, and polysubstance abuse  Clinical Impression  Pt admitted as above and presenting with functional mobility limitations 2* generalized weakness, ambulatory balance deficits and limited endurance.  Pt should progress to dc home with family assist.    Follow Up Recommendations No PT follow up    Equipment Recommendations  None recommended by PT    Recommendations for Other Services       Precautions / Restrictions Precautions Precautions: Fall Restrictions Weight Bearing Restrictions: No      Mobility  Bed Mobility Overal bed mobility: Needs Assistance Bed Mobility: Supine to Sit     Supine to sit: Min guard        Transfers Overall transfer level: Needs assistance Equipment used: Rolling walker (2 wheeled) Transfers: Sit to/from Stand Sit to Stand: Min assist;+2 safety/equipment         General transfer comment: cues for transition position and use of UEs to self assist  Ambulation/Gait Ambulation/Gait assistance: Min assist;+2 physical assistance Gait Distance (Feet): 4 Feet Assistive device: Rolling walker (2 wheeled) Gait Pattern/deviations: Step-to pattern;Decreased step length - right;Decreased step length - left;Shuffle;Trunk flexed Gait velocity: decr   General Gait Details: cues for posture, pace and position from RW; distance ltd by O2 desat into 70s  Stairs            Wheelchair Mobility    Modified Rankin (Stroke Patients Only)       Balance Overall balance assessment: Needs assistance Sitting-balance support: Feet supported;No upper extremity supported Sitting balance-Leahy Scale: Fair     Standing balance support:  Bilateral upper extremity supported Standing balance-Leahy Scale: Poor Standing balance comment: reliant on UEs                              Pertinent Vitals/Pain Pain Assessment: No/denies pain    Home Living Family/patient expects to be discharged to:: Private residence Living Arrangements: Children Available Help at Discharge: Family Type of Home: House         Home Equipment: Dan Humphreys - 2 wheels      Prior Function Level of Independence: Independent               Hand Dominance   Dominant Hand: Right    Extremity/Trunk Assessment   Upper Extremity Assessment Upper Extremity Assessment: Generalized weakness    Lower Extremity Assessment Lower Extremity Assessment: Generalized weakness       Communication   Communication: No difficulties  Cognition Arousal/Alertness: Awake/alert Behavior During Therapy: WFL for tasks assessed/performed Overall Cognitive Status: Within Functional Limits for tasks assessed                                        General Comments      Exercises     Assessment/Plan    PT Assessment Patient needs continued PT services  PT Problem List Decreased strength;Decreased activity tolerance;Decreased balance;Decreased mobility;Decreased knowledge of use of DME       PT Treatment Interventions DME instruction;Gait training;Stair training;Functional mobility training;Therapeutic activities;Therapeutic exercise;Patient/family education    PT Goals (Current  goals can be found in the Care Plan section)  Acute Rehab PT Goals Patient Stated Goal: Regain IND PT Goal Formulation: With patient Time For Goal Achievement: 03/10/19 Potential to Achieve Goals: Fair    Frequency Min 3X/week   Barriers to discharge        Co-evaluation               AM-PAC PT "6 Clicks" Mobility  Outcome Measure Help needed turning from your back to your side while in a flat bed without using bedrails?: A  Little Help needed moving from lying on your back to sitting on the side of a flat bed without using bedrails?: A Little Help needed moving to and from a bed to a chair (including a wheelchair)?: A Lot Help needed standing up from a chair using your arms (e.g., wheelchair or bedside chair)?: A Little Help needed to walk in hospital room?: A Lot Help needed climbing 3-5 steps with a railing? : A Lot 6 Click Score: 15    End of Session Equipment Utilized During Treatment: Gait belt;Oxygen Activity Tolerance: Patient limited by fatigue Patient left: in chair;with call bell/phone within reach;with nursing/sitter in room Nurse Communication: Mobility status PT Visit Diagnosis: Difficulty in walking, not elsewhere classified (R26.2);Muscle weakness (generalized) (M62.81)    Time: 9629-5284 PT Time Calculation (min) (ACUTE ONLY): 20 min   Charges:   PT Evaluation $PT Eval Low Complexity: 1 Low          Mauro Kaufmann PT Acute Rehabilitation Services Pager [phone] Office [phone]   Jeffrey [name] 02/24/2019, 4:26 PM

## 2019-02-24 NOTE — Plan of Care (Signed)
Discussed with patient plan of care for the evening, pain management, bath and trying grape juice with the nectar thick liquids for his medications tonight with some teach back displayed.

## 2019-02-24 NOTE — Progress Notes (Signed)
Pt was extubated per doctor. Pt placed on 6L and decreased to 3LNC. Rt will continue to monitor
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
## 2019-02-25 DIAGNOSIS — J441 Chronic obstructive pulmonary disease with (acute) exacerbation: Secondary | ICD-10-CM

## 2019-02-25 DIAGNOSIS — J189 Pneumonia, unspecified organism: Secondary | ICD-10-CM

## 2019-02-25 LAB — GLUCOSE, CAPILLARY
Glucose-Capillary: 113 mg/dL — ABNORMAL HIGH (ref 70–99)
Glucose-Capillary: 74 mg/dL (ref 70–99)
Glucose-Capillary: 80 mg/dL (ref 70–99)
Glucose-Capillary: 86 mg/dL (ref 70–99)

## 2019-02-25 MED ORDER — ACETAMINOPHEN 325 MG PO TABS
650.0000 mg | ORAL_TABLET | Freq: Four times a day (QID) | ORAL | Status: DC | PRN
  Administered 2019-02-25 – 2019-03-01 (×5): 650 mg via ORAL
  Filled 2019-02-25 (×5): qty 2

## 2019-02-25 MED ORDER — FLUOXETINE HCL 20 MG PO CAPS
40.0000 mg | ORAL_CAPSULE | Freq: Every day | ORAL | Status: DC
  Administered 2019-02-25 – 2019-03-07 (×11): 40 mg via ORAL
  Filled 2019-02-25 (×11): qty 2

## 2019-02-25 MED ORDER — ATORVASTATIN CALCIUM 10 MG PO TABS
20.0000 mg | ORAL_TABLET | Freq: Every day | ORAL | Status: DC
  Administered 2019-02-26 – 2019-03-07 (×10): 20 mg via ORAL
  Filled 2019-02-25: qty 1
  Filled 2019-02-25: qty 2
  Filled 2019-02-25: qty 1
  Filled 2019-02-25 (×3): qty 2
  Filled 2019-02-25 (×2): qty 1
  Filled 2019-02-25 (×2): qty 2

## 2019-02-25 MED ORDER — VITAMIN B-1 100 MG PO TABS
100.0000 mg | ORAL_TABLET | Freq: Every day | ORAL | Status: DC
  Administered 2019-02-26 – 2019-03-07 (×10): 100 mg via ORAL
  Filled 2019-02-25 (×10): qty 1

## 2019-02-25 MED ORDER — METOPROLOL TARTRATE 12.5 MG HALF TABLET
12.5000 mg | ORAL_TABLET | Freq: Two times a day (BID) | ORAL | Status: DC
  Administered 2019-02-25: 12.5 mg via ORAL
  Filled 2019-02-25: qty 1

## 2019-02-25 MED ORDER — FOLIC ACID 1 MG PO TABS
1.0000 mg | ORAL_TABLET | Freq: Every day | ORAL | Status: DC
  Administered 2019-02-26 – 2019-03-07 (×10): 1 mg via ORAL
  Filled 2019-02-25 (×10): qty 1

## 2019-02-25 MED ORDER — OLANZAPINE 5 MG PO TABS
15.0000 mg | ORAL_TABLET | Freq: Every day | ORAL | Status: DC
  Administered 2019-02-25 – 2019-03-06 (×9): 15 mg via ORAL
  Filled 2019-02-25: qty 1
  Filled 2019-02-25: qty 3
  Filled 2019-02-25 (×5): qty 1
  Filled 2019-02-25: qty 3
  Filled 2019-02-25: qty 1
  Filled 2019-02-25 (×2): qty 3
  Filled 2019-02-25: qty 1

## 2019-02-25 MED ORDER — ORAL CARE MOUTH RINSE: 15.0000 mL | Freq: Two times a day (BID) | OROMUCOSAL | Status: DC

## 2019-02-25 MED ORDER — METOPROLOL TARTRATE 25 MG PO TABS
25.0000 mg | ORAL_TABLET | Freq: Two times a day (BID) | ORAL | Status: DC
  Administered 2019-02-26 – 2019-03-07 (×18): 25 mg via ORAL
  Filled 2019-02-25 (×19): qty 1

## 2019-02-25 MED ORDER — ASPIRIN 81 MG PO CHEW
81.0000 mg | CHEWABLE_TABLET | Freq: Every day | ORAL | Status: DC
  Administered 2019-02-25 – 2019-03-07 (×11): 81 mg via ORAL
  Filled 2019-02-25 (×11): qty 1

## 2019-02-25 MED ORDER — ADULT MULTIVITAMIN W/MINERALS CH
1.0000 | ORAL_TABLET | Freq: Every day | ORAL | Status: DC
  Administered 2019-02-25 – 2019-03-07 (×10): 1 via ORAL
  Filled 2019-02-25 (×11): qty 1

## 2019-02-25 NOTE — Plan of Care (Signed)
Discussed with patient plan of care for the evening, pain management and reviewed medications for the night with some teach back displayed.  Patient states his blood from his mouth and nose have decreased with his O2 liters going down and his increased food/fluid intake.

## 2019-02-25 NOTE — Progress Notes (Signed)
NAME:  Jeffrey [name], MRN:  [id number], DOB:  12-14-1960, LOS: 9 ADMISSION DATE:  02/16/2019, CONSULTATION DATE:  02/16/2019 REFERRING MD:  Ashley Royalty TRH, CHIEF COMPLAINT:  Shortness of breath   Brief History   58 y/o former smoker admitted with dyspnea, cough fever after a recent hospital discharge for Klebsiella pneumonia requiring intubation.  During that admission there was concern for wernicke's encephalopathy.  Denies recent travel or exposure to COVID patient.  Required intubation for worsening hypoxemia on 3/27.  Past Medical History  ETOH, Polysubstance abuse, Severe COPD with emphysema, HTN, [name], Bipolar, Depression, Chronic diastolic CHF, PAD, PUD  Significant Hospital Events   3/26 Admit 3/27 VDRF 4/01 Off pressors, weaning PEEP  4/3 Extubated  Consults:    Procedures:  ETT 3/27 >>4/3 Lt IJ CVL 3/27 >>  Significant Diagnostic Tests:    Micro Data:  Respiratory viral panel 3/26 >> negative Influenza PCR 3/26 >> negative Blood 3/26 >> negative  COVID 3/27 >> Negative Tracheal aspirate 3/27 >> canceled  Urine strep 4/2 >>   Antimicrobials:  Cefepime 3/26 >> 4/2   Interim history/subjective:  Extubated and weaned to 7L O2  Objective   Blood pressure (!) 101/48, pulse 86, temperature 100 F (37.8 C), resp. rate (!) 22, height 5\' 10"  (1.778 m), weight 70.9 kg, SpO2 (!) 88 %.    Vent Mode: PSV;CPAP FiO2 (%):  [35 %] 35 % PEEP:  [5 cmH20] 5 cmH20 Pressure Support:  [5 cmH20] 5 cmH20 Plateau Pressure:  [15 cmH20] 15 cmH20   Intake/Output Summary (Last 24 hours) at 02/25/2019 0725 Last data filed at 02/25/2019 0600 Gross per 24 hour  Intake 1724.8 ml  Output 8150 ml  Net -6425.2 ml   Filed Weights   02/23/19 0500 02/24/19 0500 02/25/19 0326  Weight: 76.5 kg 76 kg 70.9 kg   Physical Exam: General: Well-appearing, no acute distress HENT: [place], AT, OP clear, MMM Eyes: EOMI, no scleral icterus Respiratory: Diminished breath sounds Cardiovascular:  RRR, -M/R/G, no JVD GI: BS+, soft, nontender Extremities:-Edema,-tenderness Neuro: AAO x4, CNII-XII grossly intact Skin: Intact, no rashes or bruising Psych: Normal mood, normal affect  Resolved Hospital Problem list   Hypotension - sedation related   Assessment & Plan:   Acute Hypoxic Respiratory Failure -COVID, RVP negative Acute COPD Exacerbation P: Supplemental oxygen for goal sats 88-92% Bronchodilators: Continue brovana + pulmicort + duonebs.  On discharge: Discontinue nebulizers and start LABA/ICS and LAMA inhaler Discontinue steroids  Hemoptysis  Minimal, likely related to pneumonia P: Hold lovenox for 4/2 dosing  Restart in am 4/3 if no further bleeding   HCAP, Flu-like illness Completed antibiotic course  Hypertension P: Continue lopressor to 12.5 mg BID   Bipolar disorder -history of wernicke's encephalopathy/hepatic encephalopathy P: Continue zyprexa, neurontin, prozac  Continue thiamine / folate    Anemia of Chronic Illness P: Transfuse for Hgb <7% per ICU guidelines  Monitor for bleeding    Best practice:  Diet: Per speech Pain/Anxiety/Delirium protocol (if indicated): No VAP protocol (if indicated): No DVT prophylaxis: lovenox GI prophylaxis: No Glucose control: NA Mobility:  PT Code Status:Full Family Communication: Updated patient. Disposition: Transfer to [facility]  Labs   CBC: Recent Labs  Lab 02/19/19 0615 02/20/19 0617 02/22/19 0500 02/23/19 0514 02/24/19 0311  WBC 8.5 9.1 7.8 9.4 8.7  HGB 9.4* 8.6* 9.4* 9.6* 9.5*  HCT 32.1* 30.9* 32.1* 33.0* 31.8*  MCV 93.9 95.4 94.4 92.7 91.4  PLT 373 405* 436* 402* 392    Basic Metabolic Panel:  Recent Labs  Lab 02/19/19 0615 02/20/19 0617 02/22/19 0547 02/23/19 0514 02/24/19 0311  NA 145 144 142 139 140  K 3.8 3.9 3.8 3.8 3.7  CL 110 109 104 99 98  CO2 29 29 34* 34* 34*  GLUCOSE 94 101* 108* 94 99  BUN 34* 25* 19 21* 25*  CREATININE 0.48* 0.44* 0.45* 0.45* 0.47*  CALCIUM 8.1*  8.0* 8.4* 8.3* 8.3*  MG  --   --  2.3 2.2  --   PHOS  --   --  3.5 3.4  --    GFR: Estimated Creatinine Clearance: 100.9 mL/min (A) (by C-G formula based on SCr of 0.47 mg/dL (L)). Recent Labs  Lab 02/20/19 0617 02/22/19 0500 02/23/19 0514 02/24/19 0311  WBC 9.1 7.8 9.4 8.7    Liver Function Tests: No results for input(s): AST, ALT, ALKPHOS, BILITOT, PROT, ALBUMIN in the last 168 hours. No results for input(s): LIPASE, AMYLASE in the last 168 hours. No results for input(s): AMMONIA in the last 168 hours.  ABG    Component Value Date/Time   PHART 7.413 02/23/2019 1516   PCO2ART 59.9 (H) 02/23/2019 1516   PO2ART 59.0 (L) 02/23/2019 1516   HCO3 37.3 (H) 02/23/2019 1516   TCO2 34 (H) 12/21/2018 0357   ACIDBASEDEF 0.6 02/17/2019 1850   O2SAT 87.3 02/23/2019 1516     Coagulation Profile: No results for input(s): INR, PROTIME in the last 168 hours.  Cardiac Enzymes: No results for input(s): CKTOTAL, CKMB, CKMBINDEX, TROPONINI in the last 168 hours.  HbA1C: No results found for: HGBA1C  CBG: Recent Labs  Lab 02/24/19 1228 02/24/19 1709 02/24/19 1928 02/24/19 2332 02/24/19 2342  GLUCAP 123* 133* 97 83 80   Mechele Collin, M.D. [facility] Pulmonary/Critical Care Medicine Pager: [phone] After hours pager: [phone]

## 2019-02-25 NOTE — Progress Notes (Signed)
Physical Therapy Treatment Patient Details Name: Jeffrey [name] MRN: [id number] DOB: 1961/10/09 Today's Date: 02/25/2019    History of Present Illness Pt admitted with COPD exacerbation 2* PNA and with hx of recent dc from hospital with Klebsiella PNA requiring intubation.  Pt also with hx of [name], COPD, bipolar, CHF, and polysubstance abuse    PT Comments    Pt progressing toward PT goals; endurance/activity tolerance much improved today; VSS, SpO2 > 90% on 6L, HR max of 126; fatigued after amb but pleased with his progress. Continue PT in acute setting  Follow Up Recommendations  No PT follow up     Equipment Recommendations  None recommended by PT    Recommendations for Other Services       Precautions / Restrictions Precautions Precautions: Fall Restrictions Weight Bearing Restrictions: No    Mobility  Bed Mobility Overal bed mobility: Needs Assistance Bed Mobility: Supine to Sit     Supine to sit: Min guard;Supervision        Transfers Overall transfer level: Needs assistance Equipment used: Rolling walker (2 wheeled) Transfers: Sit to/from Stand Sit to Stand: +2 physical assistance;Min guard;Min assist         General transfer comment: cues for transition position and use of UEs to self assist  Ambulation/Gait Ambulation/Gait assistance: Min guard;Min assist;+2 safety/equipment Gait Distance (Feet): 60 Feet(x2) Assistive device: Rolling walker (2 wheeled) Gait Pattern/deviations: Trunk flexed;Step-through pattern;Decreased stride length Gait velocity: decr   General Gait Details: cues for posture, pace and position from RW; SpO2> 90% on 6L ; seated rest between distances   Stairs             Wheelchair Mobility    Modified Rankin (Stroke Patients Only)       Balance Overall balance assessment: Needs assistance Sitting-balance support: Feet supported;No upper extremity supported Sitting balance-Leahy Scale: Good     Standing balance  support: Bilateral upper extremity supported Standing balance-Leahy Scale: Poor Standing balance comment: reliant on UEs                             Cognition Arousal/Alertness: Awake/alert Behavior During Therapy: WFL for tasks assessed/performed Overall Cognitive Status: Within Functional Limits for tasks assessed                                        Exercises      General Comments        Pertinent Vitals/Pain Pain Assessment: No/denies pain    Home Living                      Prior Function            PT Goals (current goals can now be found in the care plan section) Acute Rehab PT Goals Patient Stated Goal: Regain IND PT Goal Formulation: With patient Time For Goal Achievement: 03/10/19 Potential to Achieve Goals: Fair    Frequency    Min 3X/week      PT Plan      Co-evaluation              AM-PAC PT "6 Clicks" Mobility   Outcome Measure  Help needed turning from your back to your side while in a flat bed without using bedrails?: A Little Help needed moving from lying on your back to sitting on the  side of a flat bed without using bedrails?: A Little Help needed moving to and from a bed to a chair (including a wheelchair)?: A Lot Help needed standing up from a chair using your arms (e.g., wheelchair or bedside chair)?: A Little Help needed to walk in hospital room?: A Lot Help needed climbing 3-5 steps with a railing? : A Lot 6 Click Score: 15    End of Session Equipment Utilized During Treatment: Gait belt;Oxygen Activity Tolerance: Patient limited by fatigue Patient left: in chair;with call bell/phone within reach;with nursing/sitter in room Nurse Communication: Mobility status PT Visit Diagnosis: Difficulty in walking, not elsewhere classified (R26.2);Muscle weakness (generalized) (M62.81)     Time: 3953-2023 PT Time Calculation (min) (ACUTE ONLY): 26 min  Charges:  $Gait Training: 23-37 mins                      Drucilla Chalet, PT  Pager: [phone] Acute Rehab Dept [facility]): [phone]   02/25/2019    [facility] 02/25/2019, 3:34 PM

## 2019-02-26 ENCOUNTER — Inpatient Hospital Stay (HOSPITAL_COMMUNITY): Payer: Medicare Other

## 2019-02-26 LAB — BASIC METABOLIC PANEL
Anion gap: 11 (ref 5–15)
BUN: 16 mg/dL (ref 6–20)
CO2: 28 mmol/L (ref 22–32)
Calcium: 8.6 mg/dL — ABNORMAL LOW (ref 8.9–10.3)
Chloride: 100 mmol/L (ref 98–111)
Creatinine, Ser: 0.6 mg/dL — ABNORMAL LOW (ref 0.61–1.24)
GFR calc Af Amer: >60 mL/min (ref >60)
GFR calc non Af Amer: >60 mL/min (ref >60)
Glucose, Bld: 133 mg/dL — ABNORMAL HIGH (ref 70–99)
Potassium: 3 mmol/L — ABNORMAL LOW (ref 3.5–5.1)
Sodium: 139 mmol/L (ref 135–145)

## 2019-02-26 LAB — PROCALCITONIN: Procalcitonin: <0.1 ng/mL

## 2019-02-26 LAB — GLUCOSE, CAPILLARY
Glucose-Capillary: 119 mg/dL — ABNORMAL HIGH (ref 70–99)
Glucose-Capillary: 132 mg/dL — ABNORMAL HIGH (ref 70–99)

## 2019-02-26 MED ORDER — ADENOSINE 6 MG/2ML IV SOLN
INTRAVENOUS | Status: AC
  Administered 2019-02-26: 6 mg
  Filled 2019-02-26: qty 2

## 2019-02-26 MED ORDER — ALBUTEROL SULFATE HFA 108 (90 BASE) MCG/ACT IN AERS: 1.0000 | INHALATION_SPRAY | Freq: Four times a day (QID) | RESPIRATORY_TRACT | Status: DC | PRN

## 2019-02-26 MED ORDER — IPRATROPIUM-ALBUTEROL 0.5-2.5 (3) MG/3ML IN SOLN
3.0000 mL | RESPIRATORY_TRACT | Status: DC | PRN
  Administered 2019-02-27 – 2019-03-03 (×5): 3 mL via RESPIRATORY_TRACT
  Filled 2019-02-26 (×5): qty 3

## 2019-02-26 MED ORDER — METRONIDAZOLE IN NACL 5-0.79 MG/ML-% IV SOLN
500.0000 mg | Freq: Three times a day (TID) | INTRAVENOUS | Status: DC
  Administered 2019-02-26 – 2019-03-03 (×16): 500 mg via INTRAVENOUS
  Filled 2019-02-26 (×17): qty 100

## 2019-02-26 MED ORDER — SODIUM CHLORIDE 0.9 % IV SOLN
1.0000 g | INTRAVENOUS | Status: DC
  Administered 2019-02-26 – 2019-03-06 (×9): 1 g via INTRAVENOUS
  Filled 2019-02-26: qty 1
  Filled 2019-02-26: qty 10
  Filled 2019-02-26 (×7): qty 1
  Filled 2019-02-26: qty 10

## 2019-02-26 MED ORDER — POTASSIUM CHLORIDE CRYS ER 20 MEQ PO TBCR
40.0000 meq | EXTENDED_RELEASE_TABLET | Freq: Once | ORAL | Status: AC
  Administered 2019-02-26: 14:00:00 40 meq via ORAL
  Filled 2019-02-26: qty 2

## 2019-02-26 MED ORDER — ALBUTEROL SULFATE HFA 108 (90 BASE) MCG/ACT IN AERS
2.0000 | INHALATION_SPRAY | Freq: Four times a day (QID) | RESPIRATORY_TRACT | Status: DC
  Filled 2019-02-26: qty 6.7

## 2019-02-26 MED ORDER — DILTIAZEM HCL 60 MG PO TABS
60.0000 mg | ORAL_TABLET | Freq: Three times a day (TID) | ORAL | Status: DC
  Administered 2019-02-26 – 2019-03-06 (×24): 60 mg via ORAL
  Filled 2019-02-26 (×24): qty 1

## 2019-02-26 MED ORDER — IPRATROPIUM-ALBUTEROL 0.5-2.5 (3) MG/3ML IN SOLN
3.0000 mL | Freq: Four times a day (QID) | RESPIRATORY_TRACT | Status: DC
  Administered 2019-02-26 – 2019-03-07 (×36): 3 mL via RESPIRATORY_TRACT
  Filled 2019-02-26 (×34): qty 3

## 2019-02-26 MED ORDER — FUROSEMIDE 10 MG/ML IJ SOLN
40.0000 mg | Freq: Once | INTRAMUSCULAR | Status: AC
  Administered 2019-02-26: 40 mg via INTRAVENOUS
  Filled 2019-02-26: qty 4

## 2019-02-26 MED ORDER — NYSTATIN 100000 UNIT/GM EX POWD
Freq: Two times a day (BID) | CUTANEOUS | Status: DC
  Administered 2019-02-26 – 2019-03-07 (×12): via TOPICAL
  Filled 2019-02-26 (×2): qty 15

## 2019-02-26 MED ORDER — GABAPENTIN 300 MG PO CAPS
300.0000 mg | ORAL_CAPSULE | Freq: Three times a day (TID) | ORAL | Status: DC
  Administered 2019-02-26 – 2019-02-27 (×5): 300 mg via ORAL
  Filled 2019-02-26 (×5): qty 1

## 2019-02-26 MED ORDER — ADENOSINE 6 MG/2ML IV SOLN
INTRAVENOUS | Status: AC
  Filled 2019-02-26: qty 2

## 2019-02-26 NOTE — Progress Notes (Addendum)
PROGRESS NOTE    Jeffrey [name]  ZOX:[id number] DOB: 1961/04/16 DOA: 02/16/2019 PCP: Gareth Morgan, MD  Brief Narrative: This is a 58 year old male with history of COPD, alcohol and polysubstance abuse, chronic diastolic CHF, bipolar disorder, hypertension presented to emergency room with fever chills cough and shortness of breath. -Admitted for acute hypoxic respiratory failure/healthcare associated pneumonia treated with broad-spectrum antibiotics and had covered testing on 3/27 which was negative, he was intubated on 3/27 due to worsening hypoxia, left IJ triple-lumen catheter placed - taken off all isolation precautions 3/31 when Covid tested negative --Subsequently extubated on 4/3 -Transferred to [facility] service today 4/5 -Patient has had intermittent fevers throughout his hospital stay, T-max was 101.5 at 9 PM 4/4,   Assessment & Plan:   Acute hypoxic respiratory failure -Due to advanced COPD with exacerbation and healthcare associated pneumonia -Chest x-ray on admission and follow-up shows bilateral interstitial opacities -Status post VDRF from 3/27 through 4/3 -Currently on 8 L O2 via nasal cannula, with fever -Covid -19 testing from 3/26 and 3/27 were negative -repeat CXR today today with left greater than right lower lobe airspace disease, I wonder if he could have aspirated -Start him on Unasyn, check blood cultures -He completed a 7-day course of broad-spectrum antibiotics for healthcare associated pneumonia -Restart nebulizers -Aspiration precautions -Transfer to floor when respiratory status and oxygen requirement is better -With infectious disease Dr. Luciana Axe, appropriate to take off isolation and didn't recommend retesting given 2 negative Covid tests, I agree with this  Dysphagia -SLP evaluation completed, currently on dysphagia 1 diet -Worsened in the setting of prolonged intubation -Anticipate slow improvement -SLP following  History of bipolar disease, Wernicke's  encephalopathy -Mental status appears stable at this time -Continue home regimen of Zyprexa, Neurontin, Prozac  COPD/chronic respiratory failure -With exacerbation as above, -Completed steroid and antibiotic course while in ICU -No wheezing at this time, continue nebs -Anticipate he will need home O2 at discharge  Alcohol abuse -In remission -No withdrawal noted, out of window at this time, continue thiamine  Tobacco abuse -Counseled  DVT prophylaxis: Code Status:  Family Communication: Disposition Plan:      Procedures:   Antimicrobials:    Subjective: -Feels short of breath, some cough, febrile last night  Objective: Vitals:   02/26/19 0600 02/26/19 0603 02/26/19 0608 02/26/19 0800  BP:      Pulse: (!) 119 (!) 109    Resp: (!) 29 (!) 23    Temp:    99 F (37.2 C)  TempSrc:    Oral  SpO2: 92% 98% 95%   Weight:      Height:        Intake/Output Summary (Last 24 hours) at 02/26/2019 1053 Last data filed at 02/26/2019 1050 Gross per 24 hour  Intake 765.2 ml  Output 2550 ml  Net -1784.8 ml   Filed Weights   02/24/19 0500 02/25/19 0326 02/26/19 0331  Weight: 76 kg 70.9 kg 67.7 kg    Examination:  General exam: Chronically ill gentleman, appears much older than stated age, awake alert oriented to self and place only  Respiratory system: Very poor air movement, no expiratory wheezes, basilar rhonchi. Cardiovascular system: S1 & S2 heard, RRR Gastrointestinal system: Abdomen is nondistended, soft and nontender.Normal bowel sounds heard. Central nervous system: Alert and oriented. No focal neurological deficits. Extremities: No edema Skin: No rashes, lesions or ulcers Psychiatry: Flat affect    Data Reviewed:   CBC: Recent Labs  Lab 02/20/19 0617 02/22/19 0500 02/23/19 8119  02/24/19 0311  WBC 9.1 7.8 9.4 8.7  HGB 8.6* 9.4* 9.6* 9.5*  HCT 30.9* 32.1* 33.0* 31.8*  MCV 95.4 94.4 92.7 91.4  PLT 405* 436* 402* 392   Basic Metabolic Panel: Recent  Labs  Lab 02/20/19 0617 02/22/19 0547 02/23/19 0514 02/24/19 0311  NA 144 142 139 140  K 3.9 3.8 3.8 3.7  CL 109 104 99 98  CO2 29 34* 34* 34*  GLUCOSE 101* 108* 94 99  BUN 25* 19 21* 25*  CREATININE 0.44* 0.45* 0.45* 0.47*  CALCIUM 8.0* 8.4* 8.3* 8.3*  MG  --  2.3 2.2  --   PHOS  --  3.5 3.4  --    GFR: Estimated Creatinine Clearance: 96.4 mL/min (A) (by C-G formula based on SCr of 0.47 mg/dL (L)). Liver Function Tests: No results for input(s): AST, ALT, ALKPHOS, BILITOT, PROT, ALBUMIN in the last 168 hours. No results for input(s): LIPASE, AMYLASE in the last 168 hours. No results for input(s): AMMONIA in the last 168 hours. Coagulation Profile: No results for input(s): INR, PROTIME in the last 168 hours. Cardiac Enzymes: No results for input(s): CKTOTAL, CKMB, CKMBINDEX, TROPONINI in the last 168 hours. BNP (last 3 results) No results for input(s): PROBNP in the last 8760 hours. HbA1C: No results for input(s): HGBA1C in the last 72 hours. CBG: Recent Labs  Lab 02/24/19 2332 02/24/19 2342 02/25/19 1116 02/25/19 1512 02/25/19 2132  GLUCAP 83 80 113* 74 86   Lipid Profile: Recent Labs    02/23/19 1729  TRIG 84   Thyroid Function Tests: No results for input(s): TSH, T4TOTAL, FREET4, T3FREE, THYROIDAB in the last 72 hours. Anemia Panel: No results for input(s): VITAMINB12, FOLATE, FERRITIN, TIBC, IRON, RETICCTPCT in the last 72 hours. Urine analysis:    Component Value Date/Time   COLORURINE YELLOW 02/16/2019 1457   APPEARANCEUR CLEAR 02/16/2019 1457   LABSPEC 1.013 02/16/2019 1457   PHURINE 6.0 02/16/2019 1457   GLUCOSEU NEGATIVE 02/16/2019 1457   HGBUR NEGATIVE 02/16/2019 1457   BILIRUBINUR NEGATIVE 02/16/2019 1457   KETONESUR NEGATIVE 02/16/2019 1457   PROTEINUR NEGATIVE 02/16/2019 1457   UROBILINOGEN 0.2 03/01/2011 1427   NITRITE NEGATIVE 02/16/2019 1457   LEUKOCYTESUR NEGATIVE 02/16/2019 1457   Sepsis  Labs: @LABRCNTIP (procalcitonin:4,lacticidven:4)  ) Recent Results (from the past 240 hour(s))  Blood Culture (routine x 2)     Status: None   Collection Time: 02/16/19 12:19 PM  Result Value Ref Range Status   Specimen Description RIGHT ANTECUBITAL DRAWN BY RN  Final   Special Requests   Final    BOTTLES DRAWN AEROBIC AND ANAEROBIC Blood Culture adequate volume   Culture   Final    NO GROWTH 5 DAYS Performed at [facility], [address], [place], [place] [address]    Report Status 02/21/2019 FINAL  Final  Blood Culture (routine x 2)     Status: None   Collection Time: 02/16/19 12:52 PM  Result Value Ref Range Status   Specimen Description BLOOD LEFT WRIST  Final   Special Requests   Final    BOTTLES DRAWN AEROBIC AND ANAEROBIC Blood Culture adequate volume   Culture   Final    NO GROWTH 5 DAYS Performed at [facility], [address], [place], [place] [address]    Report Status 02/21/2019 FINAL  Final  Respiratory Panel by PCR     Status: None   Collection Time: 02/16/19  3:10 PM  Result Value Ref Range Status   Adenovirus NOT DETECTED  NOT DETECTED Final   Coronavirus 229E NOT DETECTED NOT DETECTED Final    Comment: (NOTE) The Coronavirus on the Respiratory Panel, DOES NOT test for the novel  Coronavirus (2019 nCoV)    Coronavirus HKU1 NOT DETECTED NOT DETECTED Final   Coronavirus NL63 NOT DETECTED NOT DETECTED Final   Coronavirus OC43 NOT DETECTED NOT DETECTED Final   Metapneumovirus NOT DETECTED NOT DETECTED Final   Rhinovirus / Enterovirus NOT DETECTED NOT DETECTED Final   Influenza A NOT DETECTED NOT DETECTED Final   Influenza B NOT DETECTED NOT DETECTED Final   Parainfluenza Virus 1 NOT DETECTED NOT DETECTED Final   Parainfluenza Virus 2 NOT DETECTED NOT DETECTED Final   Parainfluenza Virus 3 NOT DETECTED NOT DETECTED Final   Parainfluenza Virus 4 NOT DETECTED NOT DETECTED Final   Respiratory Syncytial Virus NOT DETECTED NOT DETECTED Final   Bordetella pertussis  NOT DETECTED NOT DETECTED Final   Chlamydophila pneumoniae NOT DETECTED NOT DETECTED Final   Mycoplasma pneumoniae NOT DETECTED NOT DETECTED Final    Comment: Performed at [facility], [address], [place], [place] [address]  Novel Coronavirus, NAA (hospital order; send-out to ref lab)     Status: None   Collection Time: 02/16/19  3:10 PM  Result Value Ref Range Status   SARS-CoV-2, NAA NOT DETECTED NOT DETECTED Final    Comment: (NOTE) Testing was performed using the cobas(R) SARS-CoV-2 test. This test was developed and its performance characteristics determined by [employer]. This test has not been FDA cleared or approved. This test has been authorized by FDA under an Emergency Use Authorization (EUA). This test is only authorized for the duration of time the declaration that circumstances exist justifying the authorization of the emergency use of in vitro diagnostic tests for detection of SARS-CoV-2 virus and/or diagnosis of COVID-19 infection under section 564(b)(1) of the Act, 21 U.S.C. 191YNW-2(N)(5), unless the authorization is terminated or revoked sooner. Performed At: [facility] [address] [place], [place] [id number] Jolene Schimke MD QI:[id number]    Coronavirus Source NASOPHARYNGEAL  Final    Comment: Performed at [facility], [address], [place], [place] [address]  MRSA PCR Screening     Status: None   Collection Time: 02/16/19  9:58 PM  Result Value Ref Range Status   MRSA by PCR NEGATIVE NEGATIVE Final    Comment:        The GeneXpert MRSA Assay (FDA approved for NASAL specimens only), is one component of a comprehensive MRSA colonization surveillance program. It is not intended to diagnose MRSA infection nor to guide or monitor treatment for MRSA infections. Performed at [facility], [address], [place], [place] [address]   Novel Coronavirus, NAA (hospital order; send-out to ref lab)     Status:  None   Collection Time: 02/17/19  3:00 PM  Result Value Ref Range Status   SARS-CoV-2, NAA NOT DETECTED NOT DETECTED Final    Comment: (NOTE) Testing was performed using the cobas(R) SARS-CoV-2 test. This test was developed and its performance characteristics determined by [employer]. This test has not been FDA cleared or approved. This test has been authorized by FDA under an Emergency Use Authorization (EUA). This test is only authorized for the duration of time the declaration that circumstances exist justifying the authorization of the emergency use of in vitro diagnostic tests for detection of SARS-CoV-2 virus and/or diagnosis of COVID-19 infection under section 564(b)(1) of the Act, 21 U.S.C. 536UYQ-0(H)(4), unless the authorization  is terminated or revoked sooner. Performed At: [facility] [address] [place], [place] [id number] Jolene Schimke MD GN:[id number]    Coronavirus Source NASOPHARYNGEAL  Final    Comment: Performed at [facility], [address] [place], [place] [address]         Radiology Studies: No results found.      Scheduled Meds:  albuterol  2 puff Inhalation Q6H   aspirin  81 mg Oral Daily   atorvastatin  20 mg Oral Daily   Chlorhexidine Gluconate Cloth  6 each Topical Daily   enoxaparin (LOVENOX) injection  40 mg Subcutaneous Q24H   FLUoxetine  40 mg Oral Daily   folic acid  1 mg Oral Daily   gabapentin  400 mg Oral Q8H   metoprolol tartrate  25 mg Oral BID   multivitamin with minerals  1 tablet Oral Daily   OLANZapine  15 mg Oral QHS   sodium chloride flush  10-40 mL Intracatheter Q12H   thiamine  100 mg Oral Daily   Continuous Infusions:  sodium chloride 10 mL/hr at 02/25/19 0001     LOS: 10 days    Time spent:  Zannie Cove, MD [facility]  02/26/2019, 10:53 AM

## 2019-02-26 NOTE — Progress Notes (Signed)
Physical Therapy Treatment Patient Details Name: Jeffrey [name] MRN: [id number] DOB: 05-12-1961 Today's Date: 02/26/2019    History of Present Illness Pt admitted with COPD exacerbation 2* PNA and with hx of recent dc from hospital with Klebsiella PNA requiring intubation.  Pt also with hx of [name], COPD, bipolar, CHF, and polysubstance abuse    PT Comments    Limited today d/t fatigue, limited amb as pt needing to return pt to bed for chest xray; will continue to follow;  Follow Up Recommendations  No PT follow up     Equipment Recommendations  None recommended by PT    Recommendations for Other Services       Precautions / Restrictions Precautions Precautions: Fall Restrictions Weight Bearing Restrictions: No    Mobility  Bed Mobility Overal bed mobility: Needs Assistance Bed Mobility: Sit to Supine;Supine to Sit     Supine to sit: Min guard;Supervision Sit to supine: Min guard;Supervision   General bed mobility comments: for lines and safety  Transfers Overall transfer level: Needs assistance Equipment used: Rolling walker (2 wheeled) Transfers: Sit to/from Stand Sit to Stand: +2 safety/equipment;Min assist;Min guard         General transfer comment: cues for transition position and use of UEs to self assist  Ambulation/Gait Ambulation/Gait assistance: Min guard;Min assist;+2 safety/equipment Gait Distance (Feet): 5 Feet Assistive device: None       General Gait Details: lateral steps along EOB d/t dyspnea; SpO2= 99-100% on 6L   Stairs             Wheelchair Mobility    Modified Rankin (Stroke Patients Only)       Balance     Sitting balance-Leahy Scale: Good     Standing balance support: Bilateral upper extremity supported Standing balance-Leahy Scale: Poor Standing balance comment: reliant on UEs                             Cognition Arousal/Alertness: Awake/alert Behavior During Therapy: WFL for tasks  assessed/performed Overall Cognitive Status: Within Functional Limits for tasks assessed                                        Exercises      General Comments        Pertinent Vitals/Pain Pain Assessment: No/denies pain    Home Living                      Prior Function            PT Goals (current goals can now be found in the care plan section) Acute Rehab PT Goals Patient Stated Goal: Regain IND PT Goal Formulation: With patient Time For Goal Achievement: 03/10/19 Potential to Achieve Goals: Fair Progress towards PT goals: Progressing toward goals    Frequency    Min 3X/week      PT Plan Current plan remains appropriate    Co-evaluation PT/OT/SLP Co-Evaluation/Treatment: Yes Reason for Co-Treatment: For patient/therapist safety PT goals addressed during session: Mobility/safety with mobility        AM-PAC PT "6 Clicks" Mobility   Outcome Measure  Help needed turning from your back to your side while in a flat bed without using bedrails?: A Little Help needed moving from lying on your back to sitting on the side of a flat bed without  using bedrails?: A Little Help needed moving to and from a bed to a chair (including a wheelchair)?: A Little Help needed standing up from a chair using your arms (e.g., wheelchair or bedside chair)?: A Little Help needed to walk in hospital room?: A Little Help needed climbing 3-5 steps with a railing? : A Little 6 Click Score: 18    End of Session Equipment Utilized During Treatment: Gait belt;Oxygen Activity Tolerance: Patient limited by fatigue Patient left: in bed;with call bell/phone within reach   PT Visit Diagnosis: Difficulty in walking, not elsewhere classified (R26.2);Muscle weakness (generalized) (M62.81)     Time: 1030-1056 PT Time Calculation (min) (ACUTE ONLY): 26 min  Charges:  $Gait Training: 8-22 mins                     Drucilla Chalet, PT  Pager: [phone] Acute  Rehab Dept [facility]): [phone]   02/26/2019    [facility] 02/26/2019, 11:25 AM

## 2019-02-26 NOTE — Evaluation (Signed)
Occupational Therapy Evaluation Patient Details Name: Jeffrey [name] MRN: [id number] DOB: 11/12/1961 Today's Date: 02/26/2019    History of Present Illness Pt admitted with COPD exacerbation 2* PNA and with hx of recent dc from hospital with Klebsiella PNA requiring intubation.  Pt also with hx of [name], COPD, bipolar, CHF, and polysubstance abuse   Clinical Impression   Pt admitted with COPD exacerbation.   Pt currently with functional limitations due to the deficits listed below (see OT Problem List).  Pt will benefit from skilled OT to increase their safety and independence with ADL and functional mobility for ADL to facilitate discharge to venue listed below.      Follow Up Recommendations  Home health OT;Supervision/Assistance - 24 hour    Equipment Recommendations  None recommended by OT    Recommendations for Other Services       Precautions / Restrictions Precautions Precautions: Fall Restrictions Weight Bearing Restrictions: No      Mobility Bed Mobility Overal bed mobility: Needs Assistance Bed Mobility: Sit to Supine;Supine to Sit     Supine to sit: Min guard;Supervision Sit to supine: Min guard;Supervision   General bed mobility comments: for lines and safety  Transfers Overall transfer level: Needs assistance Equipment used: Rolling walker (2 wheeled) Transfers: Sit to/from Stand Sit to Stand: +2 safety/equipment;Min assist;Min guard         General transfer comment: cues for transition position and use of UEs to self assist    Balance     Sitting balance-Leahy Scale: Good     Standing balance support: Bilateral upper extremity supported Standing balance-Leahy Scale: Poor Standing balance comment: reliant on UEs                            ADL either performed or assessed with clinical judgement   ADL Overall ADL's : Needs assistance/impaired Eating/Feeding: Set up;Sitting   Grooming: Set up;Sitting   Upper Body Bathing:  Minimal assistance;Sitting   Lower Body Bathing: Moderate assistance;Sit to/from stand;Cueing for sequencing;Cueing for safety;+2 for safety/equipment   Upper Body Dressing : Minimal assistance;Sitting   Lower Body Dressing: Moderate assistance;+2 for physical assistance;+2 for safety/equipment;Sit to/from stand   Toilet Transfer: Moderate assistance;+2 for physical assistance;+2 for safety/equipment                   Vision Patient Visual Report: No change from baseline              Pertinent Vitals/Pain Pain Assessment: No/denies pain     Hand Dominance Right      Communication Communication Communication: No difficulties   Cognition Arousal/Alertness: Awake/alert Behavior During Therapy: WFL for tasks assessed/performed Overall Cognitive Status: Within Functional Limits for tasks assessed                                                Home Living Family/patient expects to be discharged to:: Private residence Living Arrangements: Children Available Help at Discharge: Family Type of Home: House                 Bathroom Toilet: Handicapped height     Home Equipment: Environmental consultant - 2 wheels          Prior Functioning/Environment Level of Independence: Independent  OT Problem List: Decreased strength;Decreased activity tolerance;Decreased knowledge of use of DME or AE;Decreased safety awareness      OT Treatment/Interventions: Self-care/ADL training;Patient/family education;Energy conservation    OT Goals(Current goals can be found in the care plan section) Acute Rehab OT Goals Patient Stated Goal: Regain IND OT Goal Formulation: With patient Time For Goal Achievement: 03/05/19 Potential to Achieve Goals: Good  OT Frequency: Min 2X/week   Barriers to D/C:            Co-evaluation   Reason for Co-Treatment: For patient/therapist safety PT goals addressed during session: Mobility/safety with mobility         AM-PAC OT "6 Clicks" Daily Activity     Outcome Measure Help from another person eating meals?: A Little Help from another person taking care of personal grooming?: A Little Help from another person toileting, which includes using toliet, bedpan, or urinal?: A Lot Help from another person bathing (including washing, rinsing, drying)?: A Little Help from another person to put on and taking off regular upper body clothing?: A Lot Help from another person to put on and taking off regular lower body clothing?: A Lot 6 Click Score: 15   End of Session Equipment Utilized During Treatment: Rolling walker Nurse Communication: Mobility status  Activity Tolerance: Patient tolerated treatment well Patient left: in bed;with call bell/phone within reach;with nursing/sitter in room  OT Visit Diagnosis: Unsteadiness on feet (R26.81);Muscle weakness (generalized) (M62.81)                Time: 1030-1056 OT Time Calculation (min): 26 min Charges:  OT General Charges $OT Visit: 1 Visit OT Evaluation $OT Eval Moderate Complexity: 1 Mod  Lise Auer, OT Acute Rehabilitation Services [phone] Office- [phone]     Bejamin Hackbart, Karin Golden D 02/26/2019, 1:39 PM

## 2019-02-26 NOTE — Plan of Care (Signed)
Patient continues to be upset about his dysphagia 1 diet, refused to try any breakfast, ate 25% of lunch, drinking very minimal amounts of thickened liquids because he says he can't stand the taste of them.  Patient continues to require 8 liters of oxygen, becomes dyspneic with any exertion (rolling in bed).  MD aware, chest x-ray shows persistent airspace disease, antibiotics restarted this shift.  Patient diuresed greater than 2 liters of urine after receiving 40 mg IV lasix x 1.

## 2019-02-26 NOTE — Progress Notes (Signed)
RN walked into room to help patient with his lunch tray that had just arrived, on monitor RN noted patients heart rate in the 180's (previously in the 90s to 110's).  Patient denied feeling that his heart was racing.  EKG obtained, SVT in the 170's.  Dr. Jomarie Longs notified.  Charge nurse notified.  Dr. Jomarie Longs arrived in the room within minutes and ordered charge nurse to administer 6 mg of Adenosine IV push, patient converted back to SR/ST rate around 100.  MD added Cardizem po to current regimen and ordered BMET.

## 2019-02-27 ENCOUNTER — Ambulatory Visit (HOSPITAL_COMMUNITY): Payer: Medicare Other | Admitting: Psychiatry

## 2019-02-27 ENCOUNTER — Inpatient Hospital Stay (HOSPITAL_COMMUNITY): Payer: Medicare Other

## 2019-02-27 LAB — CBC
HCT: 33.1 % — ABNORMAL LOW (ref 39.0–52.0)
Hemoglobin: 10 g/dL — ABNORMAL LOW (ref 13.0–17.0)
MCH: 26.7 pg (ref 26.0–34.0)
MCHC: 30.2 g/dL (ref 30.0–36.0)
MCV: 88.5 fL (ref 80.0–100.0)
Platelets: 394 10*3/uL (ref 150–400)
RBC: 3.74 MIL/uL — ABNORMAL LOW (ref 4.22–5.81)
RDW: 14 % (ref 11.5–15.5)
WBC: 10.5 10*3/uL (ref 4.0–10.5)
nRBC: 0 % (ref 0.0–0.2)

## 2019-02-27 LAB — BASIC METABOLIC PANEL
Anion gap: 9 (ref 5–15)
BUN: 15 mg/dL (ref 6–20)
CO2: 28 mmol/L (ref 22–32)
Calcium: 8.3 mg/dL — ABNORMAL LOW (ref 8.9–10.3)
Chloride: 103 mmol/L (ref 98–111)
Creatinine, Ser: 0.48 mg/dL — ABNORMAL LOW (ref 0.61–1.24)
GFR calc Af Amer: >60 mL/min (ref >60)
GFR calc non Af Amer: >60 mL/min (ref >60)
Glucose, Bld: 99 mg/dL (ref 70–99)
Potassium: 3 mmol/L — ABNORMAL LOW (ref 3.5–5.1)
Sodium: 140 mmol/L (ref 135–145)

## 2019-02-27 LAB — GLUCOSE, CAPILLARY
Glucose-Capillary: 99 mg/dL (ref 70–99)
Glucose-Capillary: 107 mg/dL — ABNORMAL HIGH (ref 70–99)
Glucose-Capillary: 107 mg/dL — ABNORMAL HIGH (ref 70–99)
Glucose-Capillary: 95 mg/dL (ref 70–99)

## 2019-02-27 MED ORDER — POTASSIUM CHLORIDE 10 MEQ/50ML IV SOLN
10.0000 meq | INTRAVENOUS | Status: AC
  Administered 2019-02-27 (×4): 10 meq via INTRAVENOUS
  Filled 2019-02-27 (×4): qty 50

## 2019-02-27 MED ORDER — DIVALPROEX SODIUM ER 250 MG PO TB24
1500.0000 mg | ORAL_TABLET | Freq: Every day | ORAL | Status: DC
  Administered 2019-02-27 – 2019-03-02 (×4): 1500 mg via ORAL
  Filled 2019-02-27 (×4): qty 3
  Filled 2019-02-27: qty 6

## 2019-02-27 MED ORDER — TRAZODONE HCL 50 MG PO TABS
50.0000 mg | ORAL_TABLET | Freq: Every evening | ORAL | Status: DC | PRN
  Administered 2019-02-28 – 2019-03-06 (×5): 50 mg via ORAL
  Filled 2019-02-27 (×5): qty 1

## 2019-02-27 NOTE — Progress Notes (Addendum)
PROGRESS NOTE  Jeffrey [name] SWF:[id number] DOB: May 26, 1961 DOA: 02/16/2019 PCP: Gareth Morgan, MD   LOS: 11 days   Brief narrative: Patient is a 58 year old male with history of COPD, alcohol and polysubstance abuse, chronic diastolic CHF, bipolar disorder, hypertension. He presented to emergency room on 3/26 with fever chills cough and shortness of breath. -Admitted for acute hypoxic respiratory failure/healthcare associated pneumonia treated with broad-spectrum antibiotics and had covered testing on 3/27 which was negative, he was intubated on 3/27 due to worsening hypoxia, left IJ triple-lumen catheter placed -taken off all isolation precautions 3/31 when Covid tested negative -Subsequently extubated on 4/3 -Transferred to [facility] service today 4/5 -Patient has had intermittent fevers throughout his hospital stay, T-max was 101.5 at 9 PM 4/4,   Subjective: Patient was seen and examined this morning.  Pleasant middle-aged Caucasian male.  Sitting up in chair.  Not in distress.  Seems anxious.  Still on 8 L oxygen via nasal cannula.  Assessment/Plan:  Principal Problem:   Sepsis due to undetermined organism [facility]) Active Problems:   Tobacco use disorder   Bipolar disorder (HCC)   Essential hypertension   Acute respiratory failure with hypoxia (HCC)   Interstitial pneumonia (HCC)  Acute hypoxic respiratory failure -Due to advanced COPD with exacerbation and healthcare associated pneumonia -Chest x-ray on admission and follow-up shows bilateral interstitial opacities -Status post VDRF from 3/27 through 4/3. -Covid -19 testing from 3/26 and 3/27 were negative. -He completed a 7-day course of broad-spectrum antibiotics for healthcare associated pneumonia. -However, patient is again spiking fever and remains on 8 L oxygen via nasal cannula. -T-max 100.2 last night. -repeat CXR on 4/5 with left greater than right lower lobe airspace disease, raising suspicion of aspiration pneumonia.  -Started him on IV Rocephin and IV flagyl again on 4/5 -Clear to auscultation bilaterally on my evaluation this morning. -Continue to monitor -Continue nebulizers -Aspiration precautions -Transfer to MedSurg with telemetry -Per infectious disease Dr. Luciana Axe, appropriate to take off isolation and didn't recommend retesting given 2 negative Covid tests.  SVT -One episode yesterday, heart rate up to 170s.  EKG showed SVT.  Converted to normal sinus rhythm after 1 dose of 6 mg of adenosine IV.  Dysphagia -SLP evaluation completed, currently on dysphagia 1 diet -Worsened in the setting of prolonged intubation -Anticipate slow improvement -SLP following.  Modified barium swallow planned today.  History of bipolar disease, Wernicke's encephalopathy and anxiety -Mental status appears stable at this time -Continue home regimen of Zyprexa, Neurontin, Prozac -Prior to admission, patient was also on Depakote and trazodone which is currently on hold.  Patient is complaining of insomnia and anxiety.  His oxygen dependence may be also secondary to anxiety.  I will resume Depakote and trazodone from tonight.  COPD/chronic respiratory failure -With exacerbation as above, -Completed steroid and antibiotic course while in ICU -No wheezing at this time, continue nebs -Anticipate he will need home O2 at discharge  Alcohol abuse -In remission -No withdrawal noted, out of window at this time, continue thiamine  Tobacco abuse -Counseled  Body mass index is 20.88 kg/m. Mobility: PT following Diet: Dysphagia 1 diet DVT prophylaxis:  Lovenox Code Status:   Code Status: Full Code  Family Communication:  None at bedside Disposition Plan:  Based on PT eval  Antimicrobials:  Anti-infectives (From admission, onward)   Start     Dose/Rate Route Frequency Ordered Stop   02/26/19 1300  metroNIDAZOLE (FLAGYL) IVPB 500 mg     500 mg 100 mL/hr over  60 Minutes Intravenous Every 8 hours 02/26/19 1250      02/26/19 1300  cefTRIAXone (ROCEPHIN) 1 g in sodium chloride 0.9 % 100 mL IVPB     1 g 200 mL/hr over 30 Minutes Intravenous Every 24 hours 02/26/19 1250     02/17/19 0845  ceFEPIme (MAXIPIME) 1 g in sodium chloride 0.9 % 100 mL IVPB     1 g 200 mL/hr over 30 Minutes Intravenous Every 8 hours 02/17/19 0837 02/24/19 0015   02/16/19 2200  meropenem (MERREM) 1 g in sodium chloride 0.9 % 100 mL IVPB  Status:  Discontinued     1 g 200 mL/hr over 30 Minutes Intravenous Every 8 hours 02/16/19 1359 02/17/19 0837   02/16/19 2200  vancomycin (VANCOCIN) IVPB 1000 mg/200 mL premix  Status:  Discontinued     1,000 mg 200 mL/hr over 60 Minutes Intravenous  Once 02/16/19 2158 02/16/19 2221   02/16/19 2200  meropenem (MERREM) 1 g in sodium chloride 0.9 % 100 mL IVPB  Status:  Discontinued     1 g 200 mL/hr over 30 Minutes Intravenous  Once 02/16/19 2158 02/16/19 2221   02/16/19 2000  vancomycin (VANCOCIN) IVPB 750 mg/150 ml premix  Status:  Discontinued     750 mg 150 mL/hr over 60 Minutes Intravenous Every 8 hours 02/16/19 1359 02/17/19 0837   02/16/19 1230  vancomycin (VANCOCIN) IVPB 1000 mg/200 mL premix     1,000 mg 200 mL/hr over 60 Minutes Intravenous  Once 02/16/19 1221 02/16/19 1605   02/16/19 1230  meropenem (MERREM) 1 g in sodium chloride 0.9 % 100 mL IVPB     1 g 200 mL/hr over 30 Minutes Intravenous  Once 02/16/19 1222 02/16/19 1605      Infusions:  . sodium chloride 10 mL/hr at 02/27/19 0807  . cefTRIAXone (ROCEPHIN)  IV Stopped (02/26/19 1340)  . metronidazole Stopped (02/27/19 0601)  . potassium chloride 10 mEq (02/27/19 1022)    Scheduled Meds: . aspirin  81 mg Oral Daily  . atorvastatin  20 mg Oral Daily  . Chlorhexidine Gluconate Cloth  6 each Topical Daily  . diltiazem  60 mg Oral Q8H  . enoxaparin (LOVENOX) injection  40 mg Subcutaneous Q24H  . FLUoxetine  40 mg Oral Daily  . folic acid  1 mg Oral Daily  . gabapentin  300 mg Oral TID  . ipratropium-albuterol  3 mL  Nebulization Q6H  . metoprolol tartrate  25 mg Oral BID  . multivitamin with minerals  1 tablet Oral Daily  . nystatin   Topical BID  . OLANZapine  15 mg Oral QHS  . sodium chloride flush  10-40 mL Intracatheter Q12H  . thiamine  100 mg Oral Daily    PRN meds: acetaminophen, bisacodyl, ipratropium-albuterol, [DISCONTINUED] ondansetron **OR** ondansetron (ZOFRAN) IV, Resource ThickenUp Clear, sodium chloride flush   Objective: Vitals:   02/27/19 1000 02/27/19 1100  BP: (!) 144/73 131/75  Pulse: 81 81  Resp: 20 (!) 25  Temp:    SpO2: 97% 99%    Intake/Output Summary (Last 24 hours) at 02/27/2019 1115 Last data filed at 02/27/2019 0807 Gross per 24 hour  Intake 860.02 ml  Output 2400 ml  Net -1539.98 ml   Filed Weights   02/25/19 0326 02/26/19 0331 02/27/19 0456  Weight: 70.9 kg 67.7 kg 66 kg   Weight change: -1.7 kg Body mass index is 20.88 kg/m.   Physical Exam: General exam: Appears calm and comfortable.   Skin: No rashes,  lesions or ulcers. HEENT: Normal Lungs: Clear to auscultation bilaterally, no wheezing or crackles CVS: Regular rate and rhythm, no murmur GI/Abd soft, nontender, nondistended, bowel sound present CNS: Alert, awake, oriented x3 Psychiatry: Mood & affect appropriate.  Extremities: No pedal edema, no calf tenderness  Data Review: I have personally reviewed the laboratory data and studies available.  Recent Labs  Lab 02/22/19 0500 02/23/19 0514 02/24/19 0311 02/27/19 0441  WBC 7.8 9.4 8.7 10.5  HGB 9.4* 9.6* 9.5* 10.0*  HCT 32.1* 33.0* 31.8* 33.1*  MCV 94.4 92.7 91.4 88.5  PLT 436* 402* 392 394   Recent Labs  Lab 02/22/19 0547 02/23/19 0514 02/24/19 0311 02/26/19 1150 02/27/19 0441  NA 142 139 140 139 140  K 3.8 3.8 3.7 3.0* 3.0*  CL 104 99 98 100 103  CO2 34* 34* 34* 28 28  GLUCOSE 108* 94 99 133* 99  BUN 19 21* 25* 16 15  CREATININE 0.45* 0.45* 0.47* 0.60* 0.48*  CALCIUM 8.4* 8.3* 8.3* 8.6* 8.3*  MG 2.3 2.2  --   --   --    PHOS 3.5 3.4  --   --   --     Lorin Glass, MD  [facility] 02/27/2019

## 2019-02-27 NOTE — Progress Notes (Signed)
Modified Barium Swallow Progress Note  Patient Details  Name: Jeffrey [name] MRN: [id number] Date of Birth: 11/25/1960  Today's Date: 02/27/2019  Modified Barium Swallow completed.  Full report located under Chart Review in the Imaging Section.  Brief recommendations include the following:  Clinical Impression  Patient presents with mild oropharyngeal dysphagia without aspiration of any consistency tested.  He did have laryngeal penetration x1 to cords with thin with sequential swallows. Cued cough effectively cleared trace penetrates.  Decreased tongue base retraction results in vallecular residuals without consistent pt awareness however liquid swallows effective to clear residue.  Of note, pt did not have on his dentures during MBS which likely impaired his mastication abilities.  Barium tablet taken with thin lodged at vallecular space - with pt awareness, use of pudding cleared into esophagus.  Upon esophageal sweep, pt appeared with minimal stasis without pt awareness - thus recommend he follow esophageal precautions.     Swallow Evaluation Recommendations       SLP Diet Recommendations: Dysphagia 3 (Mech soft) solids;Thin liquid- small frequent meals!    Liquid Administration via: Cup;Straw   Medication Administration: Whole meds with puree   Supervision: Patient able to self feed;Full assist for feeding   Compensations: Slow rate;Small sips/bites(start po with liquids, drink liquids t/o meals)   Postural Changes: Remain semi-upright after after feeds/meals (Comment);Seated upright at 90 degrees   Oral Care Recommendations: Oral care BID      [name], MS [facility] SLP Acute Rehab Services Pager [phone] Office [phone]   Chales Abrahams 02/27/2019,2:48 PM

## 2019-02-27 NOTE — Progress Notes (Signed)
Patient transferred from ICU. VSS, tele applied, agree with pervious RN assessment. Denies any needs at this time. Will continue to monitor.

## 2019-02-27 NOTE — Progress Notes (Signed)
[facility] ADULT ICU REPLACEMENT PROTOCOL FOR AM LAB REPLACEMENT ONLY  The patient does apply for the [facility] Adult ICU Electrolyte Replacment Protocol based on the criteria listed below:   1. Is GFR >/= 40 ml/min? Yes.    Patient's GFR today is >60 2. Is urine output >/= 0.5 ml/kg/hr for the last 6 hours? Yes.   Patient's UOP is 1.7 ml/kg/hr 3. Is BUN < 60 mg/dL? Yes.    Patient's BUN today is 15 4. Abnormal electrolyte(s): K 3.0 5. Ordered repletion with: protocol 6. If a panic level lab has been reported, has the CCM MD in charge been notified? No..   Physician:    Markus Daft A 02/27/2019 6:43 AM

## 2019-02-27 NOTE — Progress Notes (Signed)
Physical Therapy Treatment Patient Details Name: Jeffrey [name] MRN: [id number] DOB: [birth year] Today's Date: 02/27/2019    History of Present Illness 58 yo male admitted with COPD exacerbation 2* PNA and with hx of recent dc from hospital with Klebsiella PNA requiring intubation-VDRF 3/27-4/3.  Pt also with hx of [name], COPD, bipolar, CHF, and polysubstance abuse    PT Comments    Per RN recommendations, mask was placed on pt for hallway ambulation (pt is currently in ICU). O2 sat levels on 8L- 95% at rest, 87% during ambulation, dyspnea 3/4. Distance limited by fatigue. Pt feels a bit weaker today. Will continue to follow and progress activity as tolerated.    Follow Up Recommendations  Home health PT     Equipment Recommendations  None recommended by PT    Recommendations for Other Services       Precautions / Restrictions Precautions Precautions: Fall Precaution Comments: monitor O2 Restrictions Weight Bearing Restrictions: No    Mobility  Bed Mobility               General bed mobility comments: oob in recliner  Transfers Overall transfer level: Needs assistance Equipment used: Rolling walker (2 wheeled) Transfers: Sit to/from Stand Sit to Stand: Min assist         General transfer comment: VCS safety, hand placement.   Ambulation/Gait Ambulation/Gait assistance: Min assist;+2 safety/equipment Gait Distance (Feet): 25 Feet Assistive device: Rolling walker (2 wheeled) Gait Pattern/deviations: Step-through pattern;Decreased stride length     General Gait Details: Pt fatigues easily. O2 sat 87% 8L [place], dyspnea 3/4 with ambulation.    Stairs             Wheelchair Mobility    Modified Rankin (Stroke Patients Only)       Balance Overall balance assessment: Needs assistance         Standing balance support: Bilateral upper extremity supported Standing balance-Leahy Scale: Poor                              Cognition  Arousal/Alertness: Awake/alert Behavior During Therapy: WFL for tasks assessed/performed Overall Cognitive Status: Within Functional Limits for tasks assessed                                        Exercises      General Comments        Pertinent Vitals/Pain Pain Assessment: No/denies pain    Home Living                      Prior Function            PT Goals (current goals can now be found in the care plan section) Progress towards PT goals: Progressing toward goals    Frequency    Min 3X/week      PT Plan Current plan remains appropriate    Co-evaluation              AM-PAC PT "6 Clicks" Mobility   Outcome Measure  Help needed turning from your back to your side while in a flat bed without using bedrails?: A Little Help needed moving from lying on your back to sitting on the side of a flat bed without using bedrails?: A Little Help needed moving to and from a bed to a chair (including a wheelchair)?:  A Little Help needed standing up from a chair using your arms (e.g., wheelchair or bedside chair)?: A Little Help needed to walk in hospital room?: A Little Help needed climbing 3-5 steps with a railing? : A Little 6 Click Score: 18    End of Session Equipment Utilized During Treatment: Oxygen Activity Tolerance: Patient limited by fatigue Patient left: in chair;with call bell/phone within reach   PT Visit Diagnosis: Difficulty in walking, not elsewhere classified (R26.2);Muscle weakness (generalized) (M62.81)     Time: 1135-1150 PT Time Calculation (min) (ACUTE ONLY): 15 min  Charges:  $Gait Training: 8-22 mins                       Rebeca Alert, PT Acute Rehabilitation Services Pager: [phone] Office: [phone]

## 2019-02-27 NOTE — Progress Notes (Signed)
Nutrition Follow-up  RD working remotely.   DOCUMENTATION CODES:   Not applicable  INTERVENTION:  - will order Hormel shake once/day, each supplement provides 500 kcal and 22 grams of protein. - diet advancement as medically feasible. - continue to encourage PO intakes.    NUTRITION DIAGNOSIS:   Inadequate oral intake related to acute illness, decreased appetite, other (see comment)(current diet order) as evidenced by per patient/family report, meal completion < 50%. -revised, ongoing  GOAL:   Patient will meet greater than or equal to 90% of their needs -unmet  MONITOR:   PO intake, Supplement acceptance, Diet advancement, Weight trends, Labs  ASSESSMENT:   58 y/o former smoker admitted with dyspnea, cough fever after a recent hospital discharge for Klebsiella pneumonia requiring intubation. Required intubation for worsening hypoxemia on 3/27.  Patient was extubated on 4/3 at ~12:30 PM. Estimated nutrition needs updated and based on current weight as this is consistent with admission (3/26) weight.   While intubated, patient was receiving TF. Diet advanced from NPO to Dysphagia 1, nectar thick liquids on 4/3 at 5 PM and has been eating 25-50% since that time. Per RN note this AM, patient refused breakfast d/t not like the texture of current diet and refusing to try to eat anything.     Medications reviewed; 1 mg folvite/day, daily multivitamin with minerals, 10 mEq IV KCl x4 runs 4/6, 40 mEq K-Dur x1 dose 4/5, 100 mg oral thiamine/day. Labs reviewed; CBGs: 90 and 107 mg/dl today, K: 3 mmol/l, creatinine: 0.48 mg/dl, Ca: 8.3 mg/dl.     Diet Order:   Diet Order            DIET - DYS 1 Room service appropriate? Yes; Fluid consistency: Nectar Thick  Diet effective now              EDUCATION NEEDS:   Not appropriate for education at this time  Skin:  Skin Assessment: Reviewed RN Assessment  Last BM:  4/6  Height:   Ht Readings from Last 1 Encounters:  02/16/19  5\' 10"  (1.778 m)    Weight:   Wt Readings from Last 1 Encounters:  02/27/19 66 kg    Ideal Body Weight:  75.5 kg  BMI:  Body mass index is 20.88 kg/m.  Estimated Nutritional Needs:   Kcal:  2115-2310 kcal  Protein:  85-100 grams  Fluid:  1.8L/day     Trenton Gammon, MS, RD, LDN, [facility] Inpatient Clinical Dietitian Pager # [phone] After hours/weekend pager # [phone]

## 2019-02-28 ENCOUNTER — Inpatient Hospital Stay (HOSPITAL_COMMUNITY): Payer: Medicare Other

## 2019-02-28 LAB — CBC WITH DIFFERENTIAL/PLATELET
Abs Immature Granulocytes: 0.02 10*3/uL (ref 0.00–0.07)
Basophils Absolute: 0 10*3/uL (ref 0.0–0.1)
Basophils Relative: 1 %
Eosinophils Absolute: 0.3 10*3/uL (ref 0.0–0.5)
Eosinophils Relative: 4 %
HCT: 32 % — ABNORMAL LOW (ref 39.0–52.0)
Hemoglobin: 9.7 g/dL — ABNORMAL LOW (ref 13.0–17.0)
Immature Granulocytes: 0 %
Lymphocytes Relative: 15 %
Lymphs Abs: 1.1 10*3/uL (ref 0.7–4.0)
MCH: 27 pg (ref 26.0–34.0)
MCHC: 30.3 g/dL (ref 30.0–36.0)
MCV: 89.1 fL (ref 80.0–100.0)
Monocytes Absolute: 0.4 10*3/uL (ref 0.1–1.0)
Monocytes Relative: 5 %
Neutro Abs: 5.7 10*3/uL (ref 1.7–7.7)
Neutrophils Relative %: 75 %
Platelets: 375 10*3/uL (ref 150–400)
RBC: 3.59 MIL/uL — ABNORMAL LOW (ref 4.22–5.81)
RDW: 14 % (ref 11.5–15.5)
WBC: 7.7 10*3/uL (ref 4.0–10.5)
nRBC: 0 % (ref 0.0–0.2)

## 2019-02-28 LAB — BASIC METABOLIC PANEL
Anion gap: 9 (ref 5–15)
BUN: 13 mg/dL (ref 6–20)
CO2: 28 mmol/L (ref 22–32)
Calcium: 8.5 mg/dL — ABNORMAL LOW (ref 8.9–10.3)
Chloride: 104 mmol/L (ref 98–111)
Creatinine, Ser: 0.53 mg/dL — ABNORMAL LOW (ref 0.61–1.24)
GFR calc Af Amer: >60 mL/min (ref >60)
GFR calc non Af Amer: >60 mL/min (ref >60)
Glucose, Bld: 98 mg/dL (ref 70–99)
Potassium: 3.3 mmol/L — ABNORMAL LOW (ref 3.5–5.1)
Sodium: 141 mmol/L (ref 135–145)

## 2019-02-28 LAB — GLUCOSE, CAPILLARY
Glucose-Capillary: 89 mg/dL (ref 70–99)
Glucose-Capillary: 84 mg/dL (ref 70–99)
Glucose-Capillary: 106 mg/dL — ABNORMAL HIGH (ref 70–99)
Glucose-Capillary: 106 mg/dL — ABNORMAL HIGH (ref 70–99)
Glucose-Capillary: 123 mg/dL — ABNORMAL HIGH (ref 70–99)

## 2019-02-28 LAB — BLOOD GAS, ARTERIAL
Acid-Base Excess: 4 mmol/L — ABNORMAL HIGH (ref 0.0–2.0)
Bicarbonate: 28.3 mmol/L — ABNORMAL HIGH (ref 20.0–28.0)
Drawn by: 257881
FIO2: 100
O2 Saturation: 96.9 %
Patient temperature: 37
pCO2 arterial: 43 mmHg (ref 32.0–48.0)
pH, Arterial: 7.434 (ref 7.350–7.450)
pO2, Arterial: 92.6 mmHg (ref 83.0–108.0)

## 2019-02-28 LAB — HEPATIC FUNCTION PANEL
ALT: 21 U/L (ref 0–44)
AST: 18 U/L (ref 15–41)
Albumin: 2.5 g/dL — ABNORMAL LOW (ref 3.5–5.0)
Alkaline Phosphatase: 171 U/L — ABNORMAL HIGH (ref 38–126)
Bilirubin, Direct: 0.1 mg/dL (ref 0.0–0.2)
Indirect Bilirubin: 0.7 mg/dL (ref 0.3–0.9)
Total Bilirubin: 0.8 mg/dL (ref 0.3–1.2)
Total Protein: 5.9 g/dL — ABNORMAL LOW (ref 6.5–8.1)

## 2019-02-28 MED ORDER — GABAPENTIN 400 MG PO CAPS
800.0000 mg | ORAL_CAPSULE | Freq: Every day | ORAL | Status: DC
  Administered 2019-02-28 – 2019-03-06 (×7): 800 mg via ORAL
  Filled 2019-02-28 (×8): qty 2

## 2019-02-28 MED ORDER — POTASSIUM CHLORIDE CRYS ER 20 MEQ PO TBCR
40.0000 meq | EXTENDED_RELEASE_TABLET | Freq: Once | ORAL | Status: AC
  Administered 2019-02-28: 40 meq via ORAL
  Filled 2019-02-28: qty 2

## 2019-02-28 MED ORDER — GABAPENTIN 400 MG PO CAPS
400.0000 mg | ORAL_CAPSULE | Freq: Every day | ORAL | Status: DC
  Administered 2019-02-28 – 2019-03-07 (×8): 400 mg via ORAL
  Filled 2019-02-28 (×7): qty 1

## 2019-02-28 NOTE — Progress Notes (Signed)
Nurse tech alerted this RN that patient saturations were in the 70's. When this RN entered the room, saturation was 77% on 7L HFNC. This nurse increased to 15L. Pt had respirations of 38. Pt informed this nurse that OT had just got him up to his chair. Rest, deep breathing attempted to recover O2, only elevated to 82%. MD notified, non re-breather placed, pt quickly recovered to 98%. Will continue to monitor.

## 2019-02-28 NOTE — Progress Notes (Signed)
PROGRESS NOTE  Jeffrey [name] KGM:[id number] DOB: 1961/11/14 DOA: 02/16/2019 PCP: Gareth Morgan, MD   LOS: 12 days   Brief narrative: Patient is a 58 year old male with history of COPD, alcohol and polysubstance abuse, chronic diastolic CHF, bipolar disorder, hypertension. He presented to emergency room on 3/26 with fever chills cough and shortness of breath. Chest x-ray showed bilateral pulmonary infiltrates favored to represent diffuse multifocal pneumonia.  He was admitted for acute hypoxic respiratory failure/healthcare associated pneumonia.  Started on IV cefepime.  However, his respiratory status worsened next day.  He was intubated and transferred to ICU. Test sent for COVID-19 twice (3/26 and 3/27), both negative. Extubated on 4/3, IV cefepime was stopped and patient was transferred to hospital service on 4/5.  48 hours after stopping IV cefepime, patient started to spike fever again up to 101.5.  He was started on a second course of IV Rocephin and IV Flagyl on 4/5. Patient remains dependent on 6 to 8 L oxygen via nasal cannula and has significant exercise limitation.  Subjective: Patient was seen and examined this morning.  Pleasant middle-aged Caucasian male.  Sitting up in bed.  On 8 L by nasal cannula at rest.  Highly motivated to get better and to be discharged to home.  However, on ambulation with physical therapy, he was more short of breath and his oxygen saturation dropped. Temperature pattern shows improvement, T-max 99.1.  Assessment/Plan:  Principal Problem:   Sepsis due to undetermined organism [facility]) Active Problems:   Tobacco use disorder   Bipolar disorder (HCC)   Essential hypertension   Acute respiratory failure with hypoxia (HCC)   Interstitial pneumonia (HCC)  Multifocal pneumonia/COPD exacerbation - Present on admission - Chest x-ray on admission showed bilateral interstitial opacities. - Completed 6 days course of IV cefepime - However, because of  recurrence of fever second course of antibiotics with IV Rocephin and IV azithromycin was started on 4/5. - T-max 99.1.  WBC count remains normal. - Clear to auscultation bilaterally this morning - Covid -19testing from 3/26 and 3/27 were negative. - Continue nebulizers. - Completed a course of steroid in ICU.  Acute hypoxic respiratory failure - Status post VDRF from 3/27 through 4/3. - Remains on high flow oxygen from 6 to 8 L by nasal cannula. -.Continue to monitor  SVT -One episode yesterday, heart rate up to 170s.  EKG showed SVT.  Converted to normal sinus rhythm after 1 dose of 6 mg of adenosine IV.  Dysphagia -SLP evaluation completed, currently on dysphagia 3 diet  History of bipolar disease, Wernicke's encephalopathy and anxiety -Mental status appears stable at this time -Continue home regimen of Zyprexa, Neurontin, Prozac -Prior to admission, patient was also on Depakote and trazodone which I resumed.  Continue to monitor LFT.    Alcohol abuse -In remission -No withdrawal noted,out of window at this time, continue thiamine  Tobacco abuse -Counseled  Body mass index is 20.88 kg/m. Mobility: PT following Diet: Dysphagia 1 diet DVT prophylaxis: Lovenox Code Status:  Code Status: Full Code  Family Communication: None at bedside Disposition Plan:  Patient would prefer to go home with home health and PT.  Currently dependent on high flow oxygen  Consultants:  Pulmonary, sign of  Procedures:  Intubation/extubation  Antimicrobials:  Anti-infectives (From admission, onward)   Start     Dose/Rate Route Frequency Ordered Stop   02/26/19 1300  metroNIDAZOLE (FLAGYL) IVPB 500 mg     500 mg 100 mL/hr over 60 Minutes Intravenous Every 8  hours 02/26/19 1250     02/26/19 1300  cefTRIAXone (ROCEPHIN) 1 g in sodium chloride 0.9 % 100 mL IVPB     1 g 200 mL/hr over 30 Minutes Intravenous Every 24 hours 02/26/19 1250     02/17/19 0845  ceFEPIme (MAXIPIME) 1  g in sodium chloride 0.9 % 100 mL IVPB     1 g 200 mL/hr over 30 Minutes Intravenous Every 8 hours 02/17/19 0837 02/24/19 0015   02/16/19 2200  meropenem (MERREM) 1 g in sodium chloride 0.9 % 100 mL IVPB  Status:  Discontinued     1 g 200 mL/hr over 30 Minutes Intravenous Every 8 hours 02/16/19 1359 02/17/19 0837   02/16/19 2200  vancomycin (VANCOCIN) IVPB 1000 mg/200 mL premix  Status:  Discontinued     1,000 mg 200 mL/hr over 60 Minutes Intravenous  Once 02/16/19 2158 02/16/19 2221   02/16/19 2200  meropenem (MERREM) 1 g in sodium chloride 0.9 % 100 mL IVPB  Status:  Discontinued     1 g 200 mL/hr over 30 Minutes Intravenous  Once 02/16/19 2158 02/16/19 2221   02/16/19 2000  vancomycin (VANCOCIN) IVPB 750 mg/150 ml premix  Status:  Discontinued     750 mg 150 mL/hr over 60 Minutes Intravenous Every 8 hours 02/16/19 1359 02/17/19 0837   02/16/19 1230  vancomycin (VANCOCIN) IVPB 1000 mg/200 mL premix     1,000 mg 200 mL/hr over 60 Minutes Intravenous  Once 02/16/19 1221 02/16/19 1605   02/16/19 1230  meropenem (MERREM) 1 g in sodium chloride 0.9 % 100 mL IVPB     1 g 200 mL/hr over 30 Minutes Intravenous  Once 02/16/19 1222 02/16/19 1605      Infusions:  . sodium chloride Stopped (02/27/19 1232)  . cefTRIAXone (ROCEPHIN)  IV Stopped (02/27/19 1300)  . metronidazole 500 mg (02/28/19 1610)    Scheduled Meds: . aspirin  81 mg Oral Daily  . atorvastatin  20 mg Oral Daily  . diltiazem  60 mg Oral Q8H  . divalproex  1,500 mg Oral QHS  . enoxaparin (LOVENOX) injection  40 mg Subcutaneous Q24H  . FLUoxetine  40 mg Oral Daily  . folic acid  1 mg Oral Daily  . gabapentin  400 mg Oral Daily   And  . gabapentin  800 mg Oral QHS  . ipratropium-albuterol  3 mL Nebulization Q6H  . metoprolol tartrate  25 mg Oral BID  . multivitamin with minerals  1 tablet Oral Daily  . nystatin   Topical BID  . OLANZapine  15 mg Oral QHS  . potassium chloride  40 mEq Oral Once  . sodium chloride flush   10-40 mL Intracatheter Q12H  . thiamine  100 mg Oral Daily    PRN meds: acetaminophen, bisacodyl, ipratropium-albuterol, [DISCONTINUED] ondansetron **OR** ondansetron (ZOFRAN) IV, Resource ThickenUp Clear, sodium chloride flush, traZODone   Objective: Vitals:   02/28/19 0802 02/28/19 1125  BP:    Pulse:    Resp:    Temp:    SpO2: 90% 95%    Intake/Output Summary (Last 24 hours) at 02/28/2019 1127 Last data filed at 02/28/2019 0825 Gross per 24 hour  Intake 703.85 ml  Output 1430 ml  Net -726.15 ml   Filed Weights   02/25/19 0326 02/26/19 0331 02/27/19 0456  Weight: 70.9 kg 67.7 kg 66 kg   Weight change:  Body mass index is 20.88 kg/m.   Physical Exam: General exam: Appears calm and comfortable.  Propped up  in bed.  On 6 to 8 L oxygen by nasal Skin: No rashes, lesions or ulcers. HEENT: Normal Lungs: Clear to auscultation bilaterally CVS: Regular rate and rhythm, no murmur GI/Abd soft, nondistended, nontender, bowel sound present CNS: Alert, awake oriented x3 Psychiatry: Mood & affect appropriate.  Extremities: No pedal edema, no calf tenderness  Data Review: I have personally reviewed the laboratory data and studies available.  Recent Labs  Lab 02/22/19 0500 02/23/19 0514 02/24/19 0311 02/27/19 0441 02/28/19 0333  WBC 7.8 9.4 8.7 10.5 7.7  NEUTROABS  --   --   --   --  5.7  HGB 9.4* 9.6* 9.5* 10.0* 9.7*  HCT 32.1* 33.0* 31.8* 33.1* 32.0*  MCV 94.4 92.7 91.4 88.5 89.1  PLT 436* 402* 392 394 375   Recent Labs  Lab 02/22/19 0547 02/23/19 0514 02/24/19 0311 02/26/19 1150 02/27/19 0441 02/28/19 0333  NA 142 139 140 139 140 141  K 3.8 3.8 3.7 3.0* 3.0* 3.3*  CL 104 99 98 100 103 104  CO2 34* 34* 34* 28 28 28   GLUCOSE 108* 94 99 133* 99 98  BUN 19 21* 25* 16 15 13   CREATININE 0.45* 0.45* 0.47* 0.60* 0.48* 0.53*  CALCIUM 8.4* 8.3* 8.3* 8.6* 8.3* 8.5*  MG 2.3 2.2  --   --   --   --   PHOS 3.5 3.4  --   --   --   --     Lorin Glass, MD  [facility] 02/28/2019

## 2019-02-28 NOTE — Progress Notes (Signed)
Occupational Therapy Treatment Patient Details Name: Jeffrey [name] MRN: [id number] DOB: 1961/07/02 Today's Date: 02/28/2019    History of present illness 58 yo male admitted with COPD exacerbation 2* PNA and with hx of recent dc from hospital with Klebsiella PNA requiring intubation-VDRF 3/27-4/3.  Pt also with hx of [name], COPD, bipolar, CHF, and polysubstance abuse   OT comments  Pt very agreeable to OT this day. Brushed teeth sitting in chair.  Will benefit from energy conservation strategies  Follow Up Recommendations  Home health OT;Supervision/Assistance - 24 hour    Equipment Recommendations  None recommended by OT    Recommendations for Other Services      Precautions / Restrictions Precautions Precautions: Fall Precaution Comments: monitor O2 Restrictions Weight Bearing Restrictions: No       Mobility Bed Mobility Overal bed mobility: Needs Assistance Bed Mobility: Supine to Sit     Supine to sit: Min guard        Transfers Overall transfer level: Needs assistance Equipment used: Rolling walker (2 wheeled) Transfers: Sit to/from [employer] Sit to Stand: Min assist Stand pivot transfers: Min assist       General transfer comment: Assist to steady. VCs safety, hand placement.     Balance Overall balance assessment: Needs assistance         Standing balance support: Bilateral upper extremity supported Standing balance-Leahy Scale: Poor                             ADL either performed or assessed with clinical judgement   ADL Overall ADL's : Needs assistance/impaired Eating/Feeding: Sitting   Grooming: Sitting;Set up   Upper Body Bathing: Set up;Sitting   Lower Body Bathing: Moderate assistance;Sit to/from stand;Cueing for sequencing;Cueing for safety       Lower Body Dressing: Moderate assistance;Sit to/from stand   Toilet Transfer: Minimal assistance;Stand-pivot;Cueing for sequencing;Cueing for safety    Toileting- Clothing Manipulation and Hygiene: Minimal assistance;Sit to/from stand;Cueing for sequencing;Cueing for safety       Functional mobility during ADLs: Minimal assistance;Cueing for safety;Cueing for sequencing General ADL Comments: Pt on 6 L of oxygen and sats stayed over 89.  Educated in deep breathing as well as  taking breaks as needed     Vision Patient Visual Report: No change from baseline            Cognition Arousal/Alertness: Awake/alert Behavior During Therapy: WFL for tasks assessed/performed Overall Cognitive Status: Within Functional Limits for tasks assessed                                                     Pertinent Vitals/ Pain       Pain Assessment: Faces Faces Pain Scale: Hurts a little bit Pain Location: general Pain Descriptors / Indicators: Sore Pain Intervention(s): Limited activity within patient's tolerance;Repositioned;Monitored during session     Prior Functioning/Environment              Frequency  Min 2X/week        Progress Toward Goals  OT Goals(current goals can now be found in the care plan section)  Progress towards OT goals: Progressing toward goals     Plan Discharge plan remains appropriate       AM-PAC OT "6 Clicks" Daily Activity  Outcome Measure   Help from another person eating meals?: A Little Help from another person taking care of personal grooming?: A Little Help from another person toileting, which includes using toliet, bedpan, or urinal?: A Lot Help from another person bathing (including washing, rinsing, drying)?: A Little Help from another person to put on and taking off regular upper body clothing?: A Lot Help from another person to put on and taking off regular lower body clothing?: A Lot 6 Click Score: 15    End of Session Equipment Utilized During Treatment: Rolling walker  OT Visit Diagnosis: Unsteadiness on feet (R26.81);Muscle weakness (generalized)  (M62.81)   Activity Tolerance Patient tolerated treatment well   Patient Left with call bell/phone within reach;with nursing/sitter in room;in chair   Nurse Communication Mobility status        Time: 8657-8469 OT Time Calculation (min): 13 min  Charges: OT General Charges $OT Visit: 1 Visit OT Treatments $Self Care/Home Management : 8-22 mins  Lise Auer, OT Acute Rehabilitation Services [phone] Office- [phone]      Decorey Wahlert, Karin Golden D 02/28/2019, 3:01 PM

## 2019-02-28 NOTE — Progress Notes (Signed)
RN phoned Jeffrey [name]* to assure pt tolerating po well.  She states his intake is poor but he is tolerating po without difficulty.  SLP will follow up next date 03/01/2019 to initiate RMST and for dysphagia management.   [name], MS [facility] SLP Acute Rehab Services Pager [phone] Office [phone]

## 2019-02-28 NOTE — Progress Notes (Signed)
Physical Therapy Treatment Patient Details Name: Jeffrey [name] MRN: [id number] DOB: 16-Mar-1961 Today's Date: 02/28/2019    History of Present Illness 58 yo male admitted with COPD exacerbation 2* PNA and with hx of recent dc from hospital with Klebsiella PNA requiring intubation-VDRF 3/27-4/3.  Pt also with hx of [name], COPD, bipolar, CHF, and polysubstance abuse    PT Comments    Progressing with mobility. Pt remains on HFNC O2 (8-10L). Dyspnea and drop in sat level with ambulation on today. Will continue to follow and progress activity as tolerated. He is highly motivated to mobilize and d/c home.     Follow Up Recommendations  Home health PT     Equipment Recommendations  None recommended by PT    Recommendations for Other Services       Precautions / Restrictions Precautions Precautions: Fall Precaution Comments: monitor O2 Restrictions Weight Bearing Restrictions: No    Mobility  Bed Mobility               General bed mobility comments: supervision for lines  Transfers Overall transfer level: Needs assistance Equipment used: Rolling walker (2 wheeled) Transfers: Sit to/from Stand Sit to Stand: Min assist         General transfer comment: Assist to steady. VCs safety, hand placement.   Ambulation/Gait Ambulation/Gait assistance: Min assist Gait Distance (Feet): 37 Feet(x2) Assistive device: Rolling walker (2 wheeled) Gait Pattern/deviations: Step-through pattern;Decreased stride length     General Gait Details: O2 sat level 82% on 8L [name], 86% on 10L [name], dyspnea 3/4 during ambulation. One standing rest break to allow for rest/sat level recovery.    Stairs             Wheelchair Mobility    Modified Rankin (Stroke Patients Only)       Balance Overall balance assessment: Needs assistance         Standing balance support: Bilateral upper extremity supported Standing balance-Leahy Scale: Poor                               Cognition Arousal/Alertness: Awake/[name] Behavior During Therapy: WFL for tasks assessed/performed Overall Cognitive Status: Within Functional Limits for tasks assessed                                        Exercises      General Comments        Pertinent Vitals/Pain Pain Assessment: Faces Faces Pain Scale: Hurts a little bit Pain Location: shoulder Pain Descriptors / Indicators: Aching;Sore Pain Intervention(s): Monitored during session    Home Living                      Prior Function            PT Goals (current goals can now be found in the care plan section) Progress towards PT goals: Progressing toward goals    Frequency    Min 3X/week      PT Plan Current plan remains appropriate    Co-evaluation              AM-PAC PT "6 Clicks" Mobility   Outcome Measure  Help needed turning from your back to your side while in a flat bed without using bedrails?: None Help needed moving from lying on your back to sitting on the side  of a flat bed without using bedrails?: None Help needed moving to and from a bed to a chair (including a wheelchair)?: A Little Help needed standing up from a chair using your arms (e.g., wheelchair or bedside chair)?: A Little Help needed to walk in hospital room?: A Little Help needed climbing 3-5 steps with a railing? : A Little 6 Click Score: 20    End of Session Equipment Utilized During Treatment: Oxygen;Gait belt Activity Tolerance: Patient limited by fatigue(limited by O2 sat level) Patient left: in chair;with call bell/phone within reach   PT Visit Diagnosis: Difficulty in walking, not elsewhere classified (R26.2);Muscle weakness (generalized) (M62.81)     Time: 4098-1191 PT Time Calculation (min) (ACUTE ONLY): 17 min  Charges:  $Gait Training: 8-22 mins                       Jeffrey [name], PT Acute Rehabilitation Services Pager: [phone] Office: [phone]

## 2019-02-28 NOTE — Care Management Important Message (Signed)
Important Message  Patient Details IM Letter given to the Case Manager to present to the Patient Name: ZEBULUN BLYTHE MRN: [id number] Date of Birth: [birth year]   Medicare Important Message Given:  Yes    Caren Macadam 02/28/2019, 12:03 PMImportant Message  Patient Details  Name: KHAMDEN MINISH MRN: [id number] Date of Birth: [birth year]   Medicare Important Message Given:  Yes    Caren Macadam 02/28/2019, 12:03 PM

## 2019-02-28 NOTE — Progress Notes (Signed)
Pt has respirations of 32 on 8L HFNC, O2 sats 92%. MD aware, will continue to monitor.

## 2019-03-01 LAB — CBC WITH DIFFERENTIAL/PLATELET
Abs Immature Granulocytes: 0.02 10*3/uL (ref 0.00–0.07)
Basophils Absolute: 0 10*3/uL (ref 0.0–0.1)
Basophils Relative: 0 %
Eosinophils Absolute: 0.2 10*3/uL (ref 0.0–0.5)
Eosinophils Relative: 2 %
HCT: 32.3 % — ABNORMAL LOW (ref 39.0–52.0)
Hemoglobin: 9.7 g/dL — ABNORMAL LOW (ref 13.0–17.0)
Immature Granulocytes: 0 %
Lymphocytes Relative: 17 %
Lymphs Abs: 1.2 10*3/uL (ref 0.7–4.0)
MCH: 26.9 pg (ref 26.0–34.0)
MCHC: 30 g/dL (ref 30.0–36.0)
MCV: 89.5 fL (ref 80.0–100.0)
Monocytes Absolute: 0.4 10*3/uL (ref 0.1–1.0)
Monocytes Relative: 5 %
Neutro Abs: 5.1 10*3/uL (ref 1.7–7.7)
Neutrophils Relative %: 76 %
Platelets: 384 10*3/uL (ref 150–400)
RBC: 3.61 MIL/uL — ABNORMAL LOW (ref 4.22–5.81)
RDW: 13.9 % (ref 11.5–15.5)
WBC: 6.8 10*3/uL (ref 4.0–10.5)
nRBC: 0 % (ref 0.0–0.2)

## 2019-03-01 LAB — COMPREHENSIVE METABOLIC PANEL
ALT: 22 U/L (ref 0–44)
AST: 22 U/L (ref 15–41)
Albumin: 2.4 g/dL — ABNORMAL LOW (ref 3.5–5.0)
Alkaline Phosphatase: 273 U/L — ABNORMAL HIGH (ref 38–126)
Anion gap: 7 (ref 5–15)
BUN: 10 mg/dL (ref 6–20)
CO2: 28 mmol/L (ref 22–32)
Calcium: 8.3 mg/dL — ABNORMAL LOW (ref 8.9–10.3)
Chloride: 107 mmol/L (ref 98–111)
Creatinine, Ser: 0.47 mg/dL — ABNORMAL LOW (ref 0.61–1.24)
GFR calc Af Amer: >60 mL/min (ref >60)
GFR calc non Af Amer: >60 mL/min (ref >60)
Glucose, Bld: 104 mg/dL — ABNORMAL HIGH (ref 70–99)
Potassium: 3.6 mmol/L (ref 3.5–5.1)
Sodium: 142 mmol/L (ref 135–145)
Total Bilirubin: 0.6 mg/dL (ref 0.3–1.2)
Total Protein: 5.6 g/dL — ABNORMAL LOW (ref 6.5–8.1)

## 2019-03-01 LAB — GLUCOSE, CAPILLARY: Glucose-Capillary: 227 mg/dL — ABNORMAL HIGH (ref 70–99)

## 2019-03-01 MED ORDER — BUDESONIDE 0.25 MG/2ML IN SUSP
0.2500 mg | Freq: Two times a day (BID) | RESPIRATORY_TRACT | Status: DC
  Administered 2019-03-01 – 2019-03-07 (×12): 0.25 mg via RESPIRATORY_TRACT
  Filled 2019-03-01 (×12): qty 2

## 2019-03-01 MED ORDER — METHYLPREDNISOLONE SODIUM SUCC 40 MG IJ SOLR
40.0000 mg | Freq: Four times a day (QID) | INTRAMUSCULAR | Status: DC
  Administered 2019-03-01 – 2019-03-03 (×9): 40 mg via INTRAVENOUS
  Filled 2019-03-01 (×9): qty 1

## 2019-03-01 MED ORDER — ALPRAZOLAM 0.25 MG PO TABS
0.2500 mg | ORAL_TABLET | Freq: Three times a day (TID) | ORAL | Status: AC | PRN
  Administered 2019-03-01 – 2019-03-02 (×4): 0.25 mg via ORAL
  Filled 2019-03-01 (×4): qty 1

## 2019-03-01 NOTE — Progress Notes (Signed)
PROGRESS NOTE  Jeffrey [name] BVA:[id number] DOB: [birth year] DOA: 02/16/2019 PCP: Gareth Morgan, MD   LOS: 13 days   Brief narrative: Patient is a 58 year old male with history of COPD, alcohol and polysubstance abuse, chronic diastolic CHF, bipolar disorder, hypertension.  Patient presented to emergency room on 3/26 with fever chills cough and shortness of breath.  Chest x-ray showed bilateral pulmonary infiltrates favored to represent diffuse multifocal pneumonia.  Patient was admitted with acute hypoxic respiratory failure/healthcare associated pneumonia.  Started on IV cefepime.  However, his respiratory status worsened the following day.  Patient was intubated and transferred to ICU.  Test sent for COVID-19 twice (3/26 and 3/27), both negative.  Patient was extubated on 4/3, IV cefepime was discontinued and patient was transferred to hospitalist service on 4/5.  48 hours after discontinuing IV cefepime, patient started to spike fever again up to 101.5.  Patient was started on a second course of antibiotics, IV Rocephin and IV Flagyl, on 4/5.  Patient remains dependent on 6 to 8 L oxygen via nasal cannula and has significant exercise limitation.  03/01/2019: Patient seen today.  Patient seems quite anxious.  Patient's oxygen requirement had to be titrated upwards to 15 L/min.  We will continue IV antibiotics.  Patient was reassured.  Subjective: Anxious Requiring higher amount of supplemental oxygen.  Assessment/Plan:  Principal Problem:   Sepsis due to undetermined organism [facility]) Active Problems:   Tobacco use disorder   Bipolar disorder (HCC)   Essential hypertension   Acute respiratory failure with hypoxia (HCC)   Interstitial pneumonia (HCC)  Multifocal pneumonia/COPD exacerbation - Present on admission - Chest x-ray on admission showed bilateral interstitial opacities. - Completed 6 days course of IV cefepime - However, because of recurrence of fever second course of  antibiotics with IV Rocephin and IV azithromycin was started on 4/5. - T-max 99.1.  WBC count remains normal. - Clear to auscultation bilaterally this morning - Covid -19testing from 3/26 and 3/27 were negative. - Continue nebulizers. - Completed a course of steroid in ICU. 03/01/2019: Restart IV Solu-Medrol and nebs Pulmicort.  Management will depend on hospital course.  Acute hypoxic respiratory failure - Status post VDRF from 3/27 through 4/3. - Remains on high flow oxygen from 6 to 8 L by nasal cannula. -.Continue to monitor For 07/13/2019: Patient is requiring high amount of supplemental oxygen.  Dysphagia -SLP evaluation completed, currently on dysphagia 3 diet 03/01/2019: Speech therapy seen PT is highly appreciated.  CT chest done yesterday suggestive of possible aspiration pneumonia.  History of bipolar disease, Wernicke's encephalopathy and anxiety -Mental status appears stable at this time    Alcohol abuse out of withdrawal window at this time..  Tobacco abuse -Counseled  Diet: Dysphagia 1 diet DVT prophylaxis: Lovenox Code Status:  Code Status: Full Code  Family Communication: None at bedside Disposition Plan:  This will depend on hospital course.    Consultants:  Pulmonary, signed off  Procedures:  Intubation/extubation  Antimicrobials:  Anti-infectives (From admission, onward)   Start     Dose/Rate Route Frequency Ordered Stop   02/26/19 1300  metroNIDAZOLE (FLAGYL) IVPB 500 mg     500 mg 100 mL/hr over 60 Minutes Intravenous Every 8 hours 02/26/19 1250     02/26/19 1300  cefTRIAXone (ROCEPHIN) 1 g in sodium chloride 0.9 % 100 mL IVPB     1 g 200 mL/hr over 30 Minutes Intravenous Every 24 hours 02/26/19 1250     02/17/19 0845  ceFEPIme (MAXIPIME) 1 g  in sodium chloride 0.9 % 100 mL IVPB     1 g 200 mL/hr over 30 Minutes Intravenous Every 8 hours 02/17/19 0837 02/24/19 0015   02/16/19 2200  meropenem (MERREM) 1 g in sodium chloride 0.9 % 100 mL  IVPB  Status:  Discontinued     1 g 200 mL/hr over 30 Minutes Intravenous Every 8 hours 02/16/19 1359 02/17/19 0837   02/16/19 2200  vancomycin (VANCOCIN) IVPB 1000 mg/200 mL premix  Status:  Discontinued     1,000 mg 200 mL/hr over 60 Minutes Intravenous  Once 02/16/19 2158 02/16/19 2221   02/16/19 2200  meropenem (MERREM) 1 g in sodium chloride 0.9 % 100 mL IVPB  Status:  Discontinued     1 g 200 mL/hr over 30 Minutes Intravenous  Once 02/16/19 2158 02/16/19 2221   02/16/19 2000  vancomycin (VANCOCIN) IVPB 750 mg/150 ml premix  Status:  Discontinued     750 mg 150 mL/hr over 60 Minutes Intravenous Every 8 hours 02/16/19 1359 02/17/19 0837   02/16/19 1230  vancomycin (VANCOCIN) IVPB 1000 mg/200 mL premix     1,000 mg 200 mL/hr over 60 Minutes Intravenous  Once 02/16/19 1221 02/16/19 1605   02/16/19 1230  meropenem (MERREM) 1 g in sodium chloride 0.9 % 100 mL IVPB     1 g 200 mL/hr over 30 Minutes Intravenous  Once 02/16/19 1222 02/16/19 1605      Infusions:  . sodium chloride 250 mL (02/28/19 1412)  . cefTRIAXone (ROCEPHIN)  IV 1 g (02/28/19 1300)  . metronidazole 500 mg (03/01/19 0529)    Scheduled Meds: . aspirin  81 mg Oral Daily  . atorvastatin  20 mg Oral Daily  . diltiazem  60 mg Oral Q8H  . divalproex  1,500 mg Oral QHS  . enoxaparin (LOVENOX) injection  40 mg Subcutaneous Q24H  . FLUoxetine  40 mg Oral Daily  . folic acid  1 mg Oral Daily  . gabapentin  400 mg Oral Daily   And  . gabapentin  800 mg Oral QHS  . ipratropium-albuterol  3 mL Nebulization Q6H  . metoprolol tartrate  25 mg Oral BID  . multivitamin with minerals  1 tablet Oral Daily  . nystatin   Topical BID  . OLANZapine  15 mg Oral QHS  . sodium chloride flush  10-40 mL Intracatheter Q12H  . thiamine  100 mg Oral Daily    PRN meds: acetaminophen, bisacodyl, ipratropium-albuterol, [DISCONTINUED] ondansetron **OR** ondansetron (ZOFRAN) IV, Resource ThickenUp Clear, sodium chloride flush, traZODone    Objective: Vitals:   03/01/19 0238 03/01/19 0408  BP:  (!) 146/79  Pulse:  94  Resp:  18  Temp:  98.3 F (36.8 C)  SpO2: 92% 93%    Intake/Output Summary (Last 24 hours) at 03/01/2019 1047 Last data filed at 03/01/2019 0912 Gross per 24 hour  Intake 356 ml  Output 2100 ml  Net -1744 ml   Filed Weights   02/25/19 0326 02/26/19 0331 02/27/19 0456  Weight: 70.9 kg 67.7 kg 66 kg   Weight change:  Body mass index is 20.88 kg/m.   Physical Exam: General exam: Appears anxious.   HEENT: Mild pallor.  No jaundice. Lungs: Decreased air entry.  crackles.  Inspiratory and expiratory wheeze.   CVS: Regular rate and rhythm, no murmur GI/Abd soft, nondistended, nontender, bowel sound present CNS: Alert, awake oriented x3 Extremities: No pedal edema, no calf tenderness  Data Review: I have personally reviewed the laboratory data and  studies available.  Recent Labs  Lab 02/23/19 0514 02/24/19 0311 02/27/19 0441 02/28/19 0333 03/01/19 0344  WBC 9.4 8.7 10.5 7.7 6.8  NEUTROABS  --   --   --  5.7 5.1  HGB 9.6* 9.5* 10.0* 9.7* 9.7*  HCT 33.0* 31.8* 33.1* 32.0* 32.3*  MCV 92.7 91.4 88.5 89.1 89.5  PLT 402* 392 394 375 384   Recent Labs  Lab 02/23/19 0514 02/24/19 0311 02/26/19 1150 02/27/19 0441 02/28/19 0333 03/01/19 0344  NA 139 140 139 140 141 142  K 3.8 3.7 3.0* 3.0* 3.3* 3.6  CL 99 98 100 103 104 107  CO2 34* 34* 28 28 28 28   GLUCOSE 94 99 133* 99 98 104*  BUN 21* 25* 16 15 13 10   CREATININE 0.45* 0.47* 0.60* 0.48* 0.53* 0.47*  CALCIUM 8.3* 8.3* 8.6* 8.3* 8.5* 8.3*  MG 2.2  --   --   --   --   --   PHOS 3.4  --   --   --   --   --     Barnetta Chapel, MD  [facility] 03/01/2019

## 2019-03-01 NOTE — Plan of Care (Signed)
Patient continues to require 15 liters of oxygen to maintain sats 90% and greater.  Patient looks to be short of breath even at rest even and is short of breath with any exertion.  Did get up to chair with one assist this shift and sit in the chair for several hours.  Patient tolerating small amount of solids, shortness of breath is limiting factor with his eating.  Xanax given x 1 this shift with minimal improvement in anxiety.

## 2019-03-01 NOTE — Progress Notes (Signed)
OT Cancellation Note  Patient Details Name: Jeffrey [name] MRN: [id number] DOB: [birth year]   Cancelled Treatment:    Reason Eval/Treat Not Completed: Medical issues which prohibited therapy. Per RN, pt is on 15 liters 02 and SOB. Will check another time.  Emillie Chasen 03/01/2019, 9:32 AM  Marica Otter, OTR/L Acute Rehabilitation Services [phone] [facility] pager [phone] office 03/01/2019

## 2019-03-01 NOTE — Progress Notes (Signed)
Physical Therapy Treatment Patient Details Name: Jeffrey [name] MRN: [id number] DOB: [birth year] Today's Date: 03/01/2019    History of Present Illness 58 yo male admitted with COPD exacerbation 2* PNA and with hx of recent dc from hospital with Klebsiella PNA requiring intubation-VDRF 3/27-4/3.  Pt also with hx of [name], COPD, bipolar, CHF, and polysubstance abuse    PT Comments    Unable to progress activity on today 2* pt's current respiratory status. Pt has increased work of breathing at rest on 15L HFNC and O2 sat level 90-91%. Seated LE exercises only on today. Will defer ambulation today and reassess on tomorrow.     Follow Up Recommendations  Home health PT(depending on progress/medical course)     Equipment Recommendations  None recommended by PT    Recommendations for Other Services       Precautions / Restrictions Precautions Precautions: Fall Precaution Comments: monitor O2; requiring HFNC O2 Restrictions Weight Bearing Restrictions: No    Mobility  Bed Mobility               General bed mobility comments: oob in recliner. Deferred ambulation on today 2* increased WOB at rest  Transfers                 General transfer comment: not performed due to increased WOB  Ambulation/Gait                 Stairs             Wheelchair Mobility    Modified Rankin (Stroke Patients Only)       Balance                                            Cognition Arousal/Alertness: Awake/alert Behavior During Therapy: WFL for tasks assessed/performed Overall Cognitive Status: Within Functional Limits for tasks assessed                                        Exercises General Exercises - Lower Extremity Long Arc Quad: AROM;Both;10 reps;Supine Toe Raises: AROM;Both;10 reps;Seated Heel Raises: AROM;Both;10 reps;Seated    General Comments        Pertinent Vitals/Pain Pain Assessment: No/denies pain     Home Living                      Prior Function            PT Goals (current goals can now be found in the care plan section) Progress towards PT goals: Progressing toward goals    Frequency    Min 3X/week      PT Plan Current plan remains appropriate    Co-evaluation              AM-PAC PT "6 Clicks" Mobility   Outcome Measure  Help needed turning from your back to your side while in a flat bed without using bedrails?: None Help needed moving from lying on your back to sitting on the side of a flat bed without using bedrails?: None Help needed moving to and from a bed to a chair (including a wheelchair)?: A Little Help needed standing up from a chair using your arms (e.g., wheelchair or bedside chair)?: A Little Help needed to walk in hospital room?:  A Little Help needed climbing 3-5 steps with a railing? : A Little 6 Click Score: 20    End of Session Equipment Utilized During Treatment: Oxygen Activity Tolerance: (Limited by WOB) Patient left: in chair;with call bell/phone within reach   PT Visit Diagnosis: Difficulty in walking, not elsewhere classified (R26.2);Muscle weakness (generalized) (M62.81)     Time: 8295-6213 PT Time Calculation (min) (ACUTE ONLY): 21 min  Charges:  $Therapeutic Exercise: 8-22 mins                       Rebeca Alert, PT Acute Rehabilitation Services Pager: [phone] Office: [phone]

## 2019-03-01 NOTE — Progress Notes (Addendum)
Speech Language Pathology Treatment: Dysphagia  Patient Details Name: Jeffrey [name] MRN: [id number] DOB: Mar 11, 1961 Today's Date: 03/01/2019 Time: 0865-7846 SLP Time Calculation (min) (ACUTE ONLY): 25 min  Assessment / Plan / Recommendation Clinical Impression  Note pt continuing to require increased oxygen use and CT of chest concerning for aspirate material over the dependent trach.  Pt with increased work of breathing during SLP visit thus minimal intake observed.  He was willing to try a Boost Breeze - consumption of a few sips tolerated without indication of aspiration.  SLP does not suspect oropharyngeal dysphagia as single source of aspiration and ? If GI source may be contributing?  Would pt benefit from esophagram to assure esophageal deficits not present that may be mitigated?  Pt has h/o GERD and gastritis.  Per his pulmonologist note 07/01/2018, he was taking Protonix.  Pt states he stopped taking this approximately six months ago as he did not have problems with reflux - SLP questions if this could be a contributing factor to pt's ongoing pulmonary issues?    RT arrived to provide pt with his nebulizer treatment - and his dyspnea caused concern for his ability to consume po.  SLP reviewed pt's swallow precautions with him and discussed importance of denture removal and cleaning as well as brushing his oral cavity nightly at least to decrease risk of biofilm aspirates.    Pt denies issues with dysphagia/coughing with intake.  He did state he coughed up a large amount of dark secretions last night.    Of note, SlP reviewed MBS from 02/27/2019 again - no aspiration observed and trace laryngeal penetration cleared with with dry swallows.  Swallowing tablet was precarious as he did not transit it into pharynx - instead in lodged on posterior oral tongue - spilled to vallecular space with retention.  Pt fortunately sensed tablet and cleared it with pudding bolus.  He also only had mild  pharyngeal retention.  Spoke to RN and requested pt take medication with puree *whole*  start and follow with liquids. Night RN, Jeffrey [name], reports day RN reports giving pt medication with liquids.  SLP advised against due to pt's dyspnea.  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        HPI HPI: 57 yo male former smoker presented with one week of fever, cough, dyspnea and chills.  Had admission 12/16/18 to 01/13/19 with Klebsiella PNA requiring intubation, hepatic encephalopathy with Wernicke's encephalopathy. MBS at that time revealed mod-severe dysphagia with airway intrusion to level of vocal folds and weak cough. Dys 1/nectars were recommended. According to phone conversation with his dtr, Jeffrey [name], he had several weeks of home health SLP and his diet was advanced to regular solids, thin liquids.  Upon readmission, he denied recent travel or exposure to COVID patient.  Failed outpt ABx and admitted.  Started on treatment for HCAP and AECOPD.  Developed progressive respiratory distress with hypoxia and required intubation 3/27-4/3. PMHx ETOH, Polysubstance abuse, Severe COPD with emphysema, HTN, [name], Bipolar, Depression, Chronic diastolic CHF, PAD, PUD. COVID test ordered 3/27; test (-) as of 3/31.  Per Dr. Moshe Cipro note, there is a clear alternative dx and COVID precautions were D/Cd.   Pt underwent MBS 4/6 to assess for readiness for dietary advancement. He absolutely refused to consume puree/nectar diet.  Repeat MBS conducted showing trace laryngeal penetration of thin and diet was advanced.  He had demonstrated increased oxygen needs  and is now on NRB.  Latest chest CT 02/28/2019 showed left upper and lingua pna, mild right basilar ATX.  Also noted was "aspirate material over the dependent trachea". Pt denies coughing with intake.  Pt has h/o GERD and was on a PPI per OP pulmonology - he does not appear to be on a PPI at this  time.        SLP Plan  Continue with current plan of care       Recommendations  Diet recommendations: Dysphagia 3 (mechanical soft);Thin liquid Liquids provided via: Cup Medication Administration: Whole meds with puree Supervision: Patient able to self feed Compensations: Slow rate;Small sips/bites(drink liquids t/o meal, rest if dyspneic) Postural Changes and/or Swallow Maneuvers: Seated upright 90 degrees;Upright 30-60 min after meal                Follow up Recommendations: (tbd) SLP Visit Diagnosis: Dysphagia, oropharyngeal phase (R13.12);Other (comment)(concern for esophageal/GI issue also - has h/o GERD and took protonix per Dr Kavin Leech note from 06/2018) Plan: Continue with current plan of care       GO                Chales Abrahams 03/01/2019, 8:49 PM   [name], MS [facility] SLP Acute Rehab Services Pager [phone] Office [phone]

## 2019-03-01 NOTE — Progress Notes (Signed)
Occupational Therapy Treatment Patient Details Name: Jeffrey [name] MRN: [id number] DOB: 1961/07/06 Today's Date: 03/01/2019    History of present illness 58 yo male admitted with COPD exacerbation 2* PNA and with hx of recent dc from hospital with Klebsiella PNA requiring intubation-VDRF 3/27-4/3.  Pt also with hx of [name], COPD, bipolar, CHF, and polysubstance abuse   OT comments  Limited tx due to increased WOB.  Pt was up in chair.  Reviewed energy conservation and gave him a handout on this. Pt is very motivated and wanted to do more with therapy.  Ensured him sitting up in chair was good for today   Follow Up Recommendations  Home health OT;Supervision/Assistance - 24 hour    Equipment Recommendations  None recommended by OT    Recommendations for Other Services      Precautions / Restrictions Precautions Precautions: Fall Precaution Comments: monitor O2 Restrictions Weight Bearing Restrictions: No       Mobility Bed Mobility               General bed mobility comments: oob  Transfers                 General transfer comment: not performed due to increased WOB    Balance                                           ADL either performed or assessed with clinical judgement   ADL                                         General ADL Comments: pt up in chair, but WOB increased without activity.  Pt on 15 liters 02 and sats remained in 90s.  Educated on energy conservation and gave him a handout. He does have a seat in his shower at home.  He is also able to cross legs for adls     Vision       Perception     Praxis      Cognition Arousal/Alertness: Awake/alert Behavior During Therapy: WFL for tasks assessed/performed Overall Cognitive Status: Within Functional Limits for tasks assessed                                          Exercises     Shoulder Instructions       General Comments       Pertinent Vitals/ Pain       Pain Assessment: No/denies pain  Home Living                                          Prior Functioning/Environment              Frequency  Min 2X/week        Progress Toward Goals  OT Goals(current goals can now be found in the care plan section)  Progress towards OT goals: (not progressing today due to WOB)     Plan      Co-evaluation  AM-PAC OT "6 Clicks" Daily Activity     Outcome Measure   Help from another person eating meals?: A Little Help from another person taking care of personal grooming?: A Little Help from another person toileting, which includes using toliet, bedpan, or urinal?: A Lot Help from another person bathing (including washing, rinsing, drying)?: A Lot Help from another person to put on and taking off regular upper body clothing?: A Lot Help from another person to put on and taking off regular lower body clothing?: A Lot 6 Click Score: 14    End of Session    OT Visit Diagnosis: Unsteadiness on feet (R26.81);Muscle weakness (generalized) (M62.81)   Activity Tolerance Treatment limited secondary to medical complications (Comment)   Patient Left in chair;with call bell/phone within reach   Nurse Communication          Time: 1416-1430 OT Time Calculation (min): 14 min  Charges: OT General Charges $OT Visit: 1 Visit OT Treatments $Therapeutic Activity: 8-22 mins  Jeffrey [name], OTR/L Acute Rehabilitation Services [phone] [facility] pager [phone] office 03/01/2019   Jeffrey [name] 03/01/2019, 3:30 PM

## 2019-03-02 LAB — GLUCOSE, CAPILLARY
Glucose-Capillary: 119 mg/dL — ABNORMAL HIGH (ref 70–99)
Glucose-Capillary: 165 mg/dL — ABNORMAL HIGH (ref 70–99)
Glucose-Capillary: 151 mg/dL — ABNORMAL HIGH (ref 70–99)
Glucose-Capillary: 147 mg/dL — ABNORMAL HIGH (ref 70–99)
Glucose-Capillary: 163 mg/dL — ABNORMAL HIGH (ref 70–99)

## 2019-03-02 LAB — BASIC METABOLIC PANEL
Anion gap: 9 (ref 5–15)
BUN: 11 mg/dL (ref 6–20)
CO2: 27 mmol/L (ref 22–32)
Calcium: 8.5 mg/dL — ABNORMAL LOW (ref 8.9–10.3)
Chloride: 103 mmol/L (ref 98–111)
Creatinine, Ser: 0.45 mg/dL — ABNORMAL LOW (ref 0.61–1.24)
GFR calc Af Amer: >60 mL/min (ref >60)
GFR calc non Af Amer: >60 mL/min (ref >60)
Glucose, Bld: 175 mg/dL — ABNORMAL HIGH (ref 70–99)
Potassium: 3.3 mmol/L — ABNORMAL LOW (ref 3.5–5.1)
Sodium: 139 mmol/L (ref 135–145)

## 2019-03-02 LAB — CBC WITH DIFFERENTIAL/PLATELET
Abs Immature Granulocytes: 0.02 10*3/uL (ref 0.00–0.07)
Basophils Absolute: 0 10*3/uL (ref 0.0–0.1)
Basophils Relative: 0 %
Eosinophils Absolute: 0 10*3/uL (ref 0.0–0.5)
Eosinophils Relative: 0 %
HCT: 35.6 % — ABNORMAL LOW (ref 39.0–52.0)
Hemoglobin: 10.7 g/dL — ABNORMAL LOW (ref 13.0–17.0)
Immature Granulocytes: 0 %
Lymphocytes Relative: 6 %
Lymphs Abs: 0.3 10*3/uL — ABNORMAL LOW (ref 0.7–4.0)
MCH: 26.6 pg (ref 26.0–34.0)
MCHC: 30.1 g/dL (ref 30.0–36.0)
MCV: 88.6 fL (ref 80.0–100.0)
Monocytes Absolute: 0.1 10*3/uL (ref 0.1–1.0)
Monocytes Relative: 1 %
Neutro Abs: 5.5 10*3/uL (ref 1.7–7.7)
Neutrophils Relative %: 93 %
Platelets: 394 10*3/uL (ref 150–400)
RBC: 4.02 MIL/uL — ABNORMAL LOW (ref 4.22–5.81)
RDW: 13.5 % (ref 11.5–15.5)
WBC: 5.9 10*3/uL (ref 4.0–10.5)
nRBC: 0 % (ref 0.0–0.2)

## 2019-03-02 MED ORDER — SODIUM CHLORIDE 0.9% FLUSH: 10.0000 mL | INTRAVENOUS | Status: DC | PRN

## 2019-03-02 MED ORDER — POTASSIUM CHLORIDE CRYS ER 20 MEQ PO TBCR
40.0000 meq | EXTENDED_RELEASE_TABLET | ORAL | Status: AC
  Administered 2019-03-02 (×2): 40 meq via ORAL
  Filled 2019-03-02 (×2): qty 2

## 2019-03-02 MED ORDER — SODIUM CHLORIDE 0.9% FLUSH
10.0000 mL | Freq: Two times a day (BID) | INTRAVENOUS | Status: DC
  Administered 2019-03-04: 10 mL
  Administered 2019-03-04: 20 mL
  Administered 2019-03-05 – 2019-03-07 (×4): 10 mL

## 2019-03-02 NOTE — Progress Notes (Signed)
Physical Therapy Treatment Patient Details Name: Jeffrey [name] T Johanson MRN: [id number] DOB: February 15, 1961 Today's Date: 03/02/2019    History of Present Illness 58 yo male admitted with COPD exacerbation 2* PNA and with hx of recent dc from hospital with Klebsiella PNA requiring intubation-VDRF 3/27-4/3.  Pt also with hx of [name], COPD, bipolar, CHF, and polysubstance abuse    PT Comments    Pt remains eager to mobilize. He reports feeling better than yesterday. Spoke with RN about working with pt on today-she okay'd use of NRB for ambulation. O2 sat level 93% 15L HFNC at rest; 88-90% NRB 15L during ambulation; 93% 15L HFNC end of session. Will continue to follow. May be able to remain on HFNC for ambulation next session.     Follow Up Recommendations  Home health PT(depending on progress and medical course)     Equipment Recommendations  None recommended by PT    Recommendations for Other Services       Precautions / Restrictions Precautions Precautions: Fall Precaution Comments: monitor O2; requiring HFNC O2 Restrictions Weight Bearing Restrictions: No    Mobility  Bed Mobility Overal bed mobility: Needs Assistance Bed Mobility: Supine to Sit           General bed mobility comments: for safety, lines. cues for pt to slow down and rest before standing  Transfers Overall transfer level: Needs assistance Equipment used: Rolling walker (2 wheeled) Transfers: Sit to/from Stand Sit to Stand: Min guard         General transfer comment: close guard for safety. VCs safety, hand placement, and for pt to slow down and stand a bit before walking  Ambulation/Gait Ambulation/Gait assistance: Min guard Gait Distance (Feet): 50 Feet(x2) Assistive device: Rolling walker (2 wheeled) Gait Pattern/deviations: Decreased stride length     General Gait Details: close guard for safety. VCs safety, pacing, rest breaks. Took 1 standing rest break midway. Ambulated pt on NRB 15L-O2 sat level  88-90%.    Stairs             Wheelchair Mobility    Modified Rankin (Stroke Patients Only)       Balance Overall balance assessment: Needs assistance           Standing balance-Leahy Scale: Fair                              Cognition Arousal/Alertness: Awake/alert Behavior During Therapy: WFL for tasks assessed/performed Overall Cognitive Status: Within Functional Limits for tasks assessed                                        Exercises      General Comments        Pertinent Vitals/Pain Pain Assessment: No/denies pain    Home Living                      Prior Function            PT Goals (current goals can now be found in the care plan section) Progress towards PT goals: Progressing toward goals    Frequency    Min 3X/week      PT Plan Current plan remains appropriate    Co-evaluation              AM-PAC PT "6 Clicks" Mobility   Outcome Measure  Help needed turning from your back to your side while in a flat bed without using bedrails?: None Help needed moving from lying on your back to sitting on the side of a flat bed without using bedrails?: None Help needed moving to and from a bed to a chair (including a wheelchair)?: A Little Help needed standing up from a chair using your arms (e.g., wheelchair or bedside chair)?: A Little Help needed to walk in hospital room?: A Little Help needed climbing 3-5 steps with a railing? : A Little 6 Click Score: 20    End of Session Equipment Utilized During Treatment: Oxygen Activity Tolerance: Patient tolerated treatment well Patient left: in chair;with call bell/phone within reach   PT Visit Diagnosis: Unsteadiness on feet (R26.81);Difficulty in walking, not elsewhere classified (R26.2)     Time: 0488-8916 PT Time Calculation (min) (ACUTE ONLY): 21 min  Charges:  $Gait Training: 8-22 mins                       Rebeca Alert, PT Acute  Rehabilitation Services Pager: [phone] Office: [phone]

## 2019-03-02 NOTE — Progress Notes (Signed)
Occupational Therapy Treatment Patient Details Name: Jeffrey [name] MRN: [id number] DOB: 1961/03/27 Today's Date: 03/02/2019    History of present illness 58 yo male admitted with COPD exacerbation 2* PNA and with hx of recent dc from hospital with Klebsiella PNA requiring intubation-VDRF 3/27-4/3.  Pt also with hx of [name], COPD, bipolar, CHF, and polysubstance abuse   OT comments  Pt still with some increased WOB; encouraged rest breaks; reviewed energy conservation and performed toilet transfer as well as walking in hall  Follow Up Recommendations  Home health OT;Supervision/Assistance - 24 hour    Equipment Recommendations  3:1 for over toilet   Recommendations for Other Services      Precautions / Restrictions Precautions Precautions: Fall Precaution Comments: monitor O2; requiring HFNC O2 Restrictions Weight Bearing Restrictions: No       Mobility Bed Mobility Overal bed mobility: Needs Assistance Bed Mobility: Supine to Sit           General bed mobility comments: for safety, lines. cues for pt to slow down and rest before standing  Transfers Overall transfer level: Needs assistance Equipment used: Rolling walker (2 wheeled) Transfers: Sit to/from Stand Sit to Stand: Min guard;Min assist         General transfer comment: min A from comfort height commode.  Min guard from recliner    Balance Overall balance assessment: Needs assistance           Standing balance-Leahy Scale: Fair                             ADL either performed or assessed with clinical judgement   ADL                           Toilet Transfer: Minimal assistance;Ambulation;Comfort height toilet;RW             General ADL Comments: cues for safety when sitting/standing from recliner and chair.  Also ambulated in the hall twice, with standing break and sitting break in room at min guard level     Vision       Perception     Praxis       Cognition Arousal/Alertness: Awake/alert Behavior During Therapy: Impulsive Overall Cognitive Status: Within Functional Limits for tasks assessed                                          Exercises     Shoulder Instructions       General Comments pt on 15 liters; sats 88-96%    Pertinent Vitals/ Pain       Pain Assessment: No/denies pain  Home Living                                          Prior Functioning/Environment              Frequency  Min 2X/week        Progress Toward Goals  OT Goals(current goals can now be found in the care plan section)  Progress towards OT goals: Progressing toward goals     Plan      Co-evaluation  AM-PAC OT "6 Clicks" Daily Activity     Outcome Measure   Help from another person eating meals?: A Little Help from another person taking care of personal grooming?: A Little Help from another person toileting, which includes using toliet, bedpan, or urinal?: A Little Help from another person bathing (including washing, rinsing, drying)?: A Little Help from another person to put on and taking off regular upper body clothing?: A Little Help from another person to put on and taking off regular lower body clothing?: A Little 6 Click Score: 18    End of Session    OT Visit Diagnosis: Unsteadiness on feet (R26.81);Muscle weakness (generalized) (M62.81)   Activity Tolerance Patient tolerated treatment well   Patient Left in chair;with call bell/phone within reach   Nurse Communication          Time: 0932-3557 OT Time Calculation (min): 18 min  Charges: OT General Charges $OT Visit: 1 Visit OT Treatments $Self Care/Home Management : 8-22 mins  Marica Otter, OTR/L Acute Rehabilitation Services [phone] [facility] pager [phone] office 03/02/2019   Jameire Kouba 03/02/2019, 12:37 PM

## 2019-03-02 NOTE — TOC Initial Note (Signed)
Transition of Care [facility]) - Initial/Assessment Note    Patient Details  Name: Jeffrey [name] MRN: [id number] Date of Birth: 06-20-1961  [facility] CM/SW Contact:    Althea Charon, LCSW Phone Number: 03/02/2019, 10:21 AM  Clinical Narrative:    CSW spoke with patient at bedside. Patient stated he lives at home with adult daughter, son in law and grandchildren. Patient stated he has support from family members in the home. Patient stated he would like [facility] since he has had them before in the past. CSW spoke to Clydie Braun [facility] representative) she stated they will be able to follow patient for home health needs once medically stable.          Expected Discharge Plan: Home w Home Health Services Barriers to Discharge: Continued Medical Work up   Patient Goals and CMS Choice Patient states their goals for this hospitalization and ongoing recovery are:: to go back home  CMS Medicare.gov Compare Post Acute Care list provided to:: Patient Choice offered to / list presented to : Patient  Expected Discharge Plan and Services Expected Discharge Plan: Home w Home Health Services In-house Referral: Clinical Social Work   Post Acute Care Choice: Home Health Living arrangements for the past 2 months: Single Family Home                          Prior Living Arrangements/Services Living arrangements for the past 2 months: Single Family Home Lives with:: Adult Children, Minor Children, Self Patient language and need for interpreter reviewed:: Yes Do you feel safe going back to the place where you live?: Yes      Need for Family Participation in Patient Care: Yes (Comment) Care giver support system in place?: Yes (comment) Current home services: Home PT, Home OT Criminal Activity/Legal Involvement Pertinent to Current Situation/Hospitalization: No - Comment as needed  Activities of Daily Living Home Assistive Devices/Equipment: Oxygen ADL Screening (condition  at time of admission) Patient's cognitive ability adequate to safely complete daily activities?: Yes Is the patient deaf or have difficulty hearing?: No Does the patient have difficulty seeing, even when wearing glasses/contacts?: No Does the patient have difficulty concentrating, remembering, or making decisions?: No Patient able to express need for assistance with ADLs?: Yes Does the patient have difficulty dressing or bathing?: No Independently performs ADLs?: Yes (appropriate for developmental age) Does the patient have difficulty walking or climbing stairs?: No Weakness of Legs: None Weakness of Arms/Hands: None  Permission Sought/Granted Permission sought to share information with : Family Supports Permission granted to share information with : Yes, Verbal Permission Granted  Share Information with NAME: heather blakeley   Permission granted to share info w [facility]  Permission granted to share info w Relationship: daughter  Permission granted to share info w Contact Information: [phone]  Emotional Assessment Appearance:: Appears stated age Attitude/Demeanor/Rapport: Engaged Affect (typically observed): Accepting Orientation: : Oriented to Self, Oriented to  Time, Oriented to Place, Oriented to Situation Alcohol / Substance Use: Not Applicable Psych Involvement: No (comment)  Admission diagnosis:  Multifocal pneumonia [J18.9] Patient Active Problem List   Diagnosis Date Noted  . Sepsis due to undetermined organism (HCC) 02/16/2019  . Acute respiratory failure with hypoxia (HCC) 02/16/2019  . Interstitial pneumonia (HCC) 02/16/2019  . Cough   . Dysphagia   . Palliative care by specialist   . Goals of care, counseling/discussion   . Wernicke encephalopathy syndrome 01/07/2019  . Toxic  encephalopathy 12/16/2018  . Acute hepatic encephalopathy 12/16/2018  . Respiratory failure (HCC) 12/16/2018  . Hepatic encephalopathy (HCC)   . Acute respiratory failure with  hypoxemia (HCC)   . Alcohol withdrawal syndrome, with delirium (HCC)   . COPD exacerbation (HCC) 09/16/2018  . Essential hypertension 09/16/2018  . GERD (gastroesophageal reflux disease) 09/16/2018  . Hypoxia   . Chronic diastolic CHF (congestive heart failure) (HCC)   . Bipolar disorder with severe depression (HCC) 02/01/2018  . Alcohol use disorder, severe, dependence (HCC) 02/01/2018  . Cannabis use disorder, severe, dependence (HCC) 02/01/2018  . MDD (major depressive disorder), recurrent episode, severe (HCC) 01/31/2018  . Internal and external bleeding hemorrhoids   . Rectal bleeding   . Iron deficiency anemia due to chronic blood loss 03/09/2017  . Anemia   . GIB (gastrointestinal bleeding) 02/07/2017  . Acute blood loss anemia 02/07/2017  . Weakness generalized 02/07/2017  . Dizziness 02/07/2017  . Peptic ulcer disease   . Bipolar disorder (HCC)   . 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 Dyspnea 08/26/2016  . H/O: depression 08/26/2016  . Dyslipidemia 08/26/2016  . Tobacco use disorder 08/26/2016  . [name] (peripheral vascular disease) (HCC) 06/18/2015  . Chronic diarrhea 05/02/2013  . Hematochezia 05/02/2013  . Abnormal weight loss 05/02/2013  . Abdominal pain, periumbilical 05/02/2013   PCP:  Gareth MorganKnowlton, Steve, MD Pharmacy:   [facility] [address] - [place], [place] - [address] [place] [address] [place] [address] [place] [address] Phone: [phone] Fax: [phone]     Social Determinants of Health (SDOH) Interventions    Readmission Risk Interventions Readmission Risk Prevention Plan 02/20/2019  Transportation Screening Complete  Medication Review Oceanographer(RN Care Manager) Not Complete  PCP or Specialist appointment within 3-5 days of discharge Complete  HRI or Home Care Consult (No Data)  SW Recovery Care/Counseling Consult (No Data)  Palliative Care Screening Not Applicable  Skilled Nursing Facility Not Applicable  Some recent data might be hidden

## 2019-03-02 NOTE — Progress Notes (Signed)
Jeffrey [name]:[id number] DOB: 02/05/1961 DOA: 02/16/2019 PCP: Jeffrey Morgan, MD   LOS: 14 days   Brief narrative: Patient is a 58 year old male with history of COPD, alcohol and polysubstance abuse, chronic diastolic CHF, bipolar disorder, hypertension.  Patient presented to emergency room on 3/26 with fever chills cough and shortness of breath.  Chest x-ray showed bilateral pulmonary infiltrates favored to represent diffuse multifocal pneumonia.  Patient was admitted with acute hypoxic respiratory failure/healthcare associated pneumonia.  Started on IV cefepime.  However, his respiratory status worsened the following day.  Patient was intubated and transferred to ICU.  Test sent for COVID-19 twice (3/26 and 3/27), both negative.  Patient was extubated on 4/3, IV cefepime was discontinued and patient was transferred to hospitalist service on 4/5.  48 hours after discontinuing IV cefepime, patient started to spike fever again up to 101.5.  Patient was started on a second course of antibiotics, IV Rocephin and IV Flagyl, on 4/5.  Patient remains dependent on 6 to 8 L oxygen via nasal cannula and has significant exercise limitation.  03/01/2019: Patient seen today.  Patient seems quite anxious.  Patient's oxygen requirement had to be titrated upwards to 15 L/min.  We will continue IV antibiotics.  Patient was reassured.  03/02/2019: Patient seen.  Patient looks a lot better today.  Patient was able to ambulate around the floor today.  Patient is still on 15 L of supplemental oxygen, but O2 sat is 95%.  Will titrate oxygen supplement downwards to keep O2 sat above 88%.  Improved air entry noted on lung examination.  Subjective: Looks better today. Reports feeling better.  Assessment/Plan:  Principal Problem:   Sepsis due to undetermined organism [facility]) Active Problems:   Tobacco use disorder   Bipolar disorder (HCC)   Essential hypertension   Acute respiratory failure with  hypoxia (HCC)   Interstitial pneumonia (HCC)  Multifocal pneumonia/COPD exacerbation - Present on admission - Chest x-ray on admission showed bilateral interstitial opacities. - Completed 6 days course of IV cefepime - However, because of recurrence of fever second course of antibiotics with IV Rocephin and IV azithromycin was started on 4/5. - T-max 99.1.  WBC count remains normal. - Clear to auscultation bilaterally this morning - Covid -19testing from 3/26 and 3/27 were negative. - Continue nebulizers. - Completed a course of steroid in ICU. 03/01/2019: Restarted IV Solu-Medrol and nebs Pulmicort.   03/02/2019: Looks a lot better today.  Patient also feels a lot better today.  Improved air entry globally.  Acute hypoxic respiratory failure - Status post VDRF from 3/27 through 4/3. - Remains on high flow oxygen 15 L by nasal cannula, but will start to titrate down worse. -.Continue to monitor  Dysphagia -SLP evaluation completed, currently on dysphagia 3 diet 03/01/2019: Speech therapy seen PT is highly appreciated.  CT chest done yesterday suggestive of possible aspiration pneumonia. 03/02/2019: Speech therapy input is appreciated.  Dysphagia 3 diet and thin liquids recommended.  Whole medication with pure.    History of bipolar disease, Wernicke's encephalopathy and anxiety -Mental status appears stable at this time    Alcohol abuse out of withdrawal window at this time..  Tobacco abuse -Counseled  Diet: Dysphagia 3 diet DVT prophylaxis: Lovenox Code Status:  Code Status: Full Code  Family Communication: None at bedside Disposition Plan:  This will depend on hospital course.    Consultants:  Pulmonary, signed off  Procedures:  Intubation/extubation  Antimicrobials:  Anti-infectives (From admission, onward)  Start     Dose/Rate Route Frequency Ordered Stop   02/26/19 1300  metroNIDAZOLE (FLAGYL) IVPB 500 mg     500 mg 100 mL/hr over 60 Minutes Intravenous  Every 8 hours 02/26/19 1250     02/26/19 1300  cefTRIAXone (ROCEPHIN) 1 g in sodium chloride 0.9 % 100 mL IVPB     1 g 200 mL/hr over 30 Minutes Intravenous Every 24 hours 02/26/19 1250     02/17/19 0845  ceFEPIme (MAXIPIME) 1 g in sodium chloride 0.9 % 100 mL IVPB     1 g 200 mL/hr over 30 Minutes Intravenous Every 8 hours 02/17/19 0837 02/24/19 0015   02/16/19 2200  meropenem (MERREM) 1 g in sodium chloride 0.9 % 100 mL IVPB  Status:  Discontinued     1 g 200 mL/hr over 30 Minutes Intravenous Every 8 hours 02/16/19 1359 02/17/19 0837   02/16/19 2200  vancomycin (VANCOCIN) IVPB 1000 mg/200 mL premix  Status:  Discontinued     1,000 mg 200 mL/hr over 60 Minutes Intravenous  Once 02/16/19 2158 02/16/19 2221   02/16/19 2200  meropenem (MERREM) 1 g in sodium chloride 0.9 % 100 mL IVPB  Status:  Discontinued     1 g 200 mL/hr over 30 Minutes Intravenous  Once 02/16/19 2158 02/16/19 2221   02/16/19 2000  vancomycin (VANCOCIN) IVPB 750 mg/150 ml premix  Status:  Discontinued     750 mg 150 mL/hr over 60 Minutes Intravenous Every 8 hours 02/16/19 1359 02/17/19 0837   02/16/19 1230  vancomycin (VANCOCIN) IVPB 1000 mg/200 mL premix     1,000 mg 200 mL/hr over 60 Minutes Intravenous  Once 02/16/19 1221 02/16/19 1605   02/16/19 1230  meropenem (MERREM) 1 g in sodium chloride 0.9 % 100 mL IVPB     1 g 200 mL/hr over 30 Minutes Intravenous  Once 02/16/19 1222 02/16/19 1605      Infusions:  . sodium chloride 250 mL (02/28/19 1412)  . cefTRIAXone (ROCEPHIN)  IV 1 g (03/02/19 1346)  . metronidazole 500 mg (03/02/19 0502)    Scheduled Meds: . aspirin  81 mg Oral Daily  . atorvastatin  20 mg Oral Daily  . budesonide (PULMICORT) nebulizer solution  0.25 mg Nebulization BID  . diltiazem  60 mg Oral Q8H  . divalproex  1,500 mg Oral QHS  . enoxaparin (LOVENOX) injection  40 mg Subcutaneous Q24H  . FLUoxetine  40 mg Oral Daily  . folic acid  1 mg Oral Daily  . gabapentin  400 mg Oral Daily   And   . gabapentin  800 mg Oral QHS  . ipratropium-albuterol  3 mL Nebulization Q6H  . methylPREDNISolone (SOLU-MEDROL) injection  40 mg Intravenous Q6H  . metoprolol tartrate  25 mg Oral BID  . multivitamin with minerals  1 tablet Oral Daily  . nystatin   Topical BID  . OLANZapine  15 mg Oral QHS  . sodium chloride flush  10-40 mL Intracatheter Q12H  . thiamine  100 mg Oral Daily    PRN meds: acetaminophen, ALPRAZolam, bisacodyl, ipratropium-albuterol, [DISCONTINUED] ondansetron **OR** ondansetron (ZOFRAN) IV, Resource ThickenUp Clear, sodium chloride flush, traZODone   Objective: Vitals:   03/02/19 1301 03/02/19 1406  BP: 128/76   Pulse: 77   Resp: 20   Temp: 97.9 F (36.6 C)   SpO2: 93% 94%    Intake/Output Summary (Last 24 hours) at 03/02/2019 1417 Last data filed at 03/02/2019 1414 Gross per 24 hour  Intake 2285.79 ml  Output 1450 ml  Net 835.79 ml   Filed Weights   02/26/19 0331 02/27/19 0456 03/02/19 0430  Weight: 67.7 kg 66 kg 64.6 kg   Weight change:  Body mass index is 20.43 kg/m.   Physical Exam: General exam: Not in any distress.  Awake and alert.    HEENT: Mild pallor.  No jaundice. Lungs: Improved air entry, though, still diminished.   CVS: Regular rate and rhythm, no murmur GI/Abd soft, nondistended, nontender, bowel sound present CNS: Alert, awake oriented x3 Extremities: No pedal edema, no calf tenderness  Data Review: I have personally reviewed the laboratory data and studies available.  Recent Labs  Lab 02/24/19 0311 02/27/19 0441 02/28/19 0333 03/01/19 0344 03/02/19 0518  WBC 8.7 10.5 7.7 6.8 5.9  NEUTROABS  --   --  5.7 5.1 5.5  HGB 9.5* 10.0* 9.7* 9.7* 10.7*  HCT 31.8* 33.1* 32.0* 32.3* 35.6*  MCV 91.4 88.5 89.1 89.5 88.6  PLT 392 394 375 384 394   Recent Labs  Lab 02/26/19 1150 02/27/19 0441 02/28/19 0333 03/01/19 0344 03/02/19 0518  NA 139 140 141 142 139  K 3.0* 3.0* 3.3* 3.6 3.3*  CL 100 103 104 107 103  CO2 28 28 28 28 27    GLUCOSE 133* 99 98 104* 175*  BUN 16 15 13 10 11   CREATININE 0.60* 0.48* 0.53* 0.47* 0.45*  CALCIUM 8.6* 8.3* 8.5* 8.3* 8.5*    Barnetta Chapel, MD  [facility] 03/02/2019

## 2019-03-02 NOTE — Progress Notes (Signed)
  Speech Language Pathology Treatment: Dysphagia  Patient Details Name: Jeffrey [name] MRN: [id number] DOB: 1961/06/07 Today's Date: 03/02/2019 Time: 1048-1100 SLP Time Calculation (min) (ACUTE ONLY): 12 min  Assessment / Plan / Recommendation Clinical Impression  Pt doing better clinically today.  Lung sounds are better per Dr. Dartha Lodge, and pt is much less anxious.  Sitting in recliner having just finished working with OT. In good spirits; consumed crackers, water, applesauce with verbal prompts needed to follow written swallow instructions on bedside table.  No c/o difficulty; no clinical deficits observed with any PO intake today, despite continued high oxygen needs (HFNC 15 LPM).  No signs of impaired respiratory/swallow dissynchrony. Continue current diet with esophageal precautions.    HPI HPI: 58 yo male former smoker presented with one week of fever, cough, dyspnea and chills.  Had admission 12/16/18 to 01/13/19 with Klebsiella PNA requiring intubation, hepatic encephalopathy with Wernicke's encephalopathy. MBS at that time revealed mod-severe dysphagia with airway intrusion to level of vocal folds and weak cough. Dys 1/nectars were recommended. According to phone conversation with his dtr, [name], he had several weeks of home health SLP and his diet was advanced to regular solids, thin liquids.  Upon readmission, he denied recent travel or exposure to COVID patient.  Failed outpt ABx and admitted.  Started on treatment for HCAP and AECOPD.  Developed progressive respiratory distress with hypoxia and required intubation 3/27-4/3. PMHx ETOH, Polysubstance abuse, Severe COPD with emphysema, HTN, [name], Bipolar, Depression, Chronic diastolic CHF, PAD, PUD. COVID test ordered 3/27; test (-) as of 3/31.  Per Dr. Moshe Cipro note, there is a clear alternative dx and COVID precautions were D/Cd.   Pt underwent MBS 4/6 to assess for readiness for dietary advancement. He absolutely refused to consume puree/nectar  diet.  Repeat MBS conducted showing trace laryngeal penetration of thin and diet was advanced.  He had demonstrated increased oxygen needs and is now on NRB.  Latest chest CT 02/28/2019 showed left upper and lingua pna, mild right basilar ATX.  Also noted was "aspirate material over the dependent trachea". Pt denies coughing with intake.  Pt has h/o GERD and was on a PPI per OP pulmonology - he does not appear to be on a PPI at this time.        SLP Plan  Continue with current plan of care       Recommendations  Diet recommendations: Dysphagia 3 (mechanical soft);Thin liquid Liquids provided via: Cup Medication Administration: Whole meds with puree Supervision: Patient able to self feed Compensations: Slow rate;Small sips/bites Postural Changes and/or Swallow Maneuvers: Seated upright 90 degrees;Upright 30-60 min after meal                Oral Care Recommendations: Oral care BID SLP Visit Diagnosis: Dysphagia, oropharyngeal phase (R13.12) Plan: Continue with current plan of care       GO                Blenda Mounts Laurice 03/02/2019, 11:54 AM  Marchelle Folks L. Samson Frederic, MA CCC/SLP Acute Rehabilitation Services Office number [phone] Pager [phone]

## 2019-03-02 NOTE — Progress Notes (Signed)
Pts oxygen weaned down to 11L HFNC with O2 sat remaining around 90-92%. Explained the importance and need to wean pt off oxygen to go home.

## 2019-03-03 ENCOUNTER — Inpatient Hospital Stay (HOSPITAL_COMMUNITY): Payer: Medicare Other

## 2019-03-03 DIAGNOSIS — J9621 Acute and chronic respiratory failure with hypoxia: Secondary | ICD-10-CM

## 2019-03-03 DIAGNOSIS — J9622 Acute and chronic respiratory failure with hypercapnia: Secondary | ICD-10-CM

## 2019-03-03 LAB — CULTURE, BLOOD (ROUTINE X 2)
Culture: NO GROWTH
Culture: NO GROWTH
Special Requests: ADEQUATE
Special Requests: ADEQUATE

## 2019-03-03 LAB — BLOOD GAS, ARTERIAL
Acid-Base Excess: 2.2 mmol/L — ABNORMAL HIGH (ref 0.0–2.0)
Acid-Base Excess: 5 mmol/L — ABNORMAL HIGH (ref 0.0–2.0)
Bicarbonate: 31 mmol/L — ABNORMAL HIGH (ref 20.0–28.0)
Bicarbonate: 31.3 mmol/L — ABNORMAL HIGH (ref 20.0–28.0)
Drawn by: 331471
Drawn by: 11249
FIO2: 100
FIO2: 100
MECHVT: 430 mL
O2 Saturation: 89 %
O2 Saturation: 99.2 %
PEEP: 5 cmH2O
Patient temperature: 98.6
Patient temperature: 98.6
RATE: 30 resp/min
pCO2 arterial: 74.4 mmHg (ref 32.0–48.0)
pCO2 arterial: 58.5 mmHg — ABNORMAL HIGH (ref 32.0–48.0)
pH, Arterial: 7.243 — ABNORMAL LOW (ref 7.350–7.450)
pH, Arterial: 7.348 — ABNORMAL LOW (ref 7.350–7.450)
pO2, Arterial: 73 mmHg — ABNORMAL LOW (ref 83.0–108.0)
pO2, Arterial: 148 mmHg — ABNORMAL HIGH (ref 83.0–108.0)

## 2019-03-03 LAB — RENAL FUNCTION PANEL
Albumin: 2.5 g/dL — ABNORMAL LOW (ref 3.5–5.0)
Anion gap: 10 (ref 5–15)
BUN: 12 mg/dL (ref 6–20)
CO2: 27 mmol/L (ref 22–32)
Calcium: 8.7 mg/dL — ABNORMAL LOW (ref 8.9–10.3)
Chloride: 103 mmol/L (ref 98–111)
Creatinine, Ser: 0.41 mg/dL — ABNORMAL LOW (ref 0.61–1.24)
GFR calc Af Amer: >60 mL/min (ref >60)
GFR calc non Af Amer: >60 mL/min (ref >60)
Glucose, Bld: 183 mg/dL — ABNORMAL HIGH (ref 70–99)
Phosphorus: 3.3 mg/dL (ref 2.5–4.6)
Potassium: 4.2 mmol/L (ref 3.5–5.1)
Sodium: 140 mmol/L (ref 135–145)

## 2019-03-03 LAB — MAGNESIUM: Magnesium: 1.8 mg/dL (ref 1.7–2.4)

## 2019-03-03 MED ORDER — LORAZEPAM 0.5 MG PO TABS: 0.5000 mg | ORAL_TABLET | Freq: Four times a day (QID) | ORAL | Status: DC | PRN

## 2019-03-03 MED ORDER — FENTANYL CITRATE (PF) 100 MCG/2ML IJ SOLN
INTRAMUSCULAR | Status: AC
  Administered 2019-03-03: 19:00:00 100 ug
  Filled 2019-03-03: qty 2

## 2019-03-03 MED ORDER — LORAZEPAM 2 MG/ML IJ SOLN
0.5000 mg | Freq: Once | INTRAMUSCULAR | Status: DC
  Filled 2019-03-03: qty 1

## 2019-03-03 MED ORDER — ETOMIDATE 2 MG/ML IV SOLN
20.0000 mg | Freq: Once | INTRAVENOUS | Status: AC
  Administered 2019-03-03: 20 mg via INTRAVENOUS

## 2019-03-03 MED ORDER — MIDAZOLAM HCL 2 MG/2ML IJ SOLN
1.0000 mg | Freq: Once | INTRAMUSCULAR | Status: AC
  Administered 2019-03-03: 1 mg via INTRAVENOUS

## 2019-03-03 MED ORDER — FENTANYL CITRATE (PF) 100 MCG/2ML IJ SOLN: 100.0000 ug | INTRAMUSCULAR | Status: DC | PRN

## 2019-03-03 MED ORDER — MIDAZOLAM HCL 2 MG/2ML IJ SOLN
2.0000 mg | INTRAMUSCULAR | Status: DC | PRN
  Administered 2019-03-04: 2 mg via INTRAVENOUS
  Filled 2019-03-03: qty 2

## 2019-03-03 MED ORDER — FUROSEMIDE 10 MG/ML IJ SOLN
40.0000 mg | Freq: Once | INTRAMUSCULAR | Status: AC
  Administered 2019-03-03: 40 mg via INTRAVENOUS
  Filled 2019-03-03: qty 4

## 2019-03-03 MED ORDER — MIDAZOLAM HCL 2 MG/2ML IJ SOLN
2.0000 mg | INTRAMUSCULAR | Status: DC | PRN
  Administered 2019-03-03: 2 mg via INTRAVENOUS
  Filled 2019-03-03: qty 2

## 2019-03-03 MED ORDER — LORAZEPAM 0.5 MG PO TABS: 0.5000 mg | ORAL_TABLET | Freq: Once | ORAL | Status: DC

## 2019-03-03 MED ORDER — ROCURONIUM BROMIDE 50 MG/5ML IV SOLN
1.0000 mg/kg | Freq: Once | INTRAVENOUS | Status: AC
  Administered 2019-03-03: 60 mg via INTRAVENOUS

## 2019-03-03 MED ORDER — DEXMEDETOMIDINE HCL IN NACL 200 MCG/50ML IV SOLN
0.0000 ug/kg/h | INTRAVENOUS | Status: DC
  Administered 2019-03-03 – 2019-03-04 (×4): 1 ug/kg/h via INTRAVENOUS
  Administered 2019-03-04: 1.003 ug/kg/h via INTRAVENOUS
  Administered 2019-03-05: 0.502 ug/kg/h via INTRAVENOUS
  Filled 2019-03-03 (×6): qty 50

## 2019-03-03 MED ORDER — METHYLPREDNISOLONE SODIUM SUCC 125 MG IJ SOLR
60.0000 mg | Freq: Every day | INTRAMUSCULAR | Status: DC
  Administered 2019-03-04: 60 mg via INTRAVENOUS
  Filled 2019-03-03: qty 2

## 2019-03-03 MED ORDER — FENTANYL CITRATE (PF) 100 MCG/2ML IJ SOLN
100.0000 ug | Freq: Once | INTRAMUSCULAR | Status: AC
  Administered 2019-03-03: 100 ug via INTRAVENOUS

## 2019-03-03 MED ORDER — VALPROIC ACID 250 MG/5ML PO SOLN
500.0000 mg | Freq: Three times a day (TID) | ORAL | Status: DC
  Administered 2019-03-04 (×4): 500 mg
  Filled 2019-03-03 (×5): qty 10

## 2019-03-03 NOTE — Care Management Important Message (Signed)
Important Message  Patient Details IM Letter given to the Case Manager to present to the Patient Name: Jeffrey [name] MRN: [id number] Date of Birth: [birth year]   Medicare Important Message Given:  Yes    Caren Macadam 03/03/2019, 11:07 AM

## 2019-03-03 NOTE — Progress Notes (Addendum)
PROGRESS NOTE  Jeffrey [name] WGN:[id number] DOB: [birth year] DOA: 02/16/2019 PCP: Gareth Morgan, MD  HPI/Recap of past 24 hours: Patient seen and examined at bedside he complained that he was feeling great this morning but cannot breathe and that he felt tired.  He was on high flow oxygen at 10 L and his oxygen was saturation was 90%.  His work of breathing was still a little up and he was desaturating to 88 when he moved.  However in the afternoon of March 03, 2019: He suddenly became short of breath diaphoretic tachypneic in respiratory distress anxious and was  tiring out, critical care was consulted and he was transferred to ICU where he was reintubated . Of note, patient was just extubated on April of third 2020   Assessment/Plan: Principal Problem:   Sepsis due to undetermined organism [facility]) Active Problems:   Tobacco use disorder   Bipolar disorder (HCC)   Essential hypertension   Acute respiratory failure with hypoxia (HCC)   Interstitial pneumonia (HCC)  #1 acute on chronic respiratory failure patient decompensated heart to be transferred to ICU and intubated  2.  Multifocal pneumonia/COPD exacerbation O2 dependent COVID-19 were negative x2.  Is currently on IV Solu-Medrol and duo nebs  3.  Dysphagia patient is currently on dysphagia 3 diet he had a speech swallow evaluation done.  4.  Bipolar disorder mental status is stable  5.  Alcohol abuse history of Wernicke's encephalopathy.  He does not have any risk of withdrawal at this time   Severity of Illness: The appropriate patient status for this patient is INPATIENT. Inpatient status is judged to be reasonable and necessary in order to provide the required intensity of service to ensure the patient's safety. The patient's presenting symptoms, physical exam findings, and initial radiographic and laboratory data in the context of their chronic comorbidities is felt to place them at high risk for further clinical  deterioration. Furthermore, it is not anticipated that the patient will be medically stable for discharge from the hospital within 2 midnights of admission. The following factors support the patient status of inpatient.   " Patient went into acute respiratory distress requiring intubation * I certify that at the point of admission it is my clinical judgment that the patient will require inpatient hospital care spanning beyond 2 midnights from the point of admission due to high intensity of service, high risk for further deterioration and high frequency of surveillance required.*     Code Status: FULL Family Communication: Spoke by phone to his daughter [name] and updated her on his deteriorated condition and transferred to ICU and intubation  Disposition Plan: TBD   Consultants:  Luciano Cutter, MD,  Critical Care Procedures:  INTUBATION 03/03/2019  Antimicrobials: metroNIDAZOLE (FLAGYL) IVPB 500 mg     500 mg 100 mL/hr over 60 Minutes Intravenous Every 8 hours 02/26/19 1250     02/26/19 1300  cefTRIAXone (ROCEPHIN) 1 g in           DVT prophylaxis:  LEVONOX   Objective: Vitals:   03/03/19 0404 03/03/19 0405 03/03/19 0500 03/03/19 0732  BP:  134/75    Pulse: 86 85    Resp:  16    Temp:  98 F (36.7 C)    TempSrc:  Oral    SpO2: 94% 94% 92% 90%  Weight:      Height:        Intake/Output Summary (Last 24 hours) at 03/03/2019 7846 Last data filed at 03/03/2019  2595 Gross per 24 hour  Intake 1900 ml  Output 1875 ml  Net 25 ml   Filed Weights   02/26/19 0331 02/27/19 0456 03/02/19 0430  Weight: 67.7 kg 66 kg 64.6 kg   Body mass index is 20.43 kg/m.  Exam:  . General: 58 y.o. year-old male well developed well nourished . Alert and oriented x3.  In respiratory distress diaphoretic . Cardiovascular: Tachycardia . Respiratory: Tachypneic bilateral rhonchi and wheezing  . abdomen: Soft nontender nondistended with normal bowel sounds x4 quadrants. .  Musculoskeletal: No lower extremity edema. 2/4 pulses in all 4 extremities. . Skin: No ulcerative lesions noted or rashes, . Psychiatry: Mood is appropriate for condition and setting    Data Reviewed: CBC: Recent Labs  Lab 02/27/19 0441 02/28/19 0333 03/01/19 0344 03/02/19 0518  WBC 10.5 7.7 6.8 5.9  NEUTROABS  --  5.7 5.1 5.5  HGB 10.0* 9.7* 9.7* 10.7*  HCT 33.1* 32.0* 32.3* 35.6*  MCV 88.5 89.1 89.5 88.6  PLT 394 375 384 394   Basic Metabolic Panel: Recent Labs  Lab 02/27/19 0441 02/28/19 0333 03/01/19 0344 03/02/19 0518 03/03/19 0453  NA 140 141 142 139 140  K 3.0* 3.3* 3.6 3.3* 4.2  CL 103 104 107 103 103  CO2 28 28 28 27 27   GLUCOSE 99 98 104* 175* 183*  BUN 15 13 10 11 12   CREATININE 0.48* 0.53* 0.47* 0.45* 0.41*  CALCIUM 8.3* 8.5* 8.3* 8.5* 8.7*  MG  --   --   --   --  1.8  PHOS  --   --   --   --  3.3   GFR: Estimated Creatinine Clearance: 92 mL/min (A) (by C-G formula based on SCr of 0.41 mg/dL (L)). Liver Function Tests: Recent Labs  Lab 02/28/19 0333 03/01/19 0344 03/03/19 0453  AST 18 22  --   ALT 21 22  --   ALKPHOS 171* 273*  --   BILITOT 0.8 0.6  --   PROT 5.9* 5.6*  --   ALBUMIN 2.5* 2.4* 2.5*   No results for input(s): LIPASE, AMYLASE in the last 168 hours. No results for input(s): AMMONIA in the last 168 hours. Coagulation Profile: No results for input(s): INR, PROTIME in the last 168 hours. Cardiac Enzymes: No results for input(s): CKTOTAL, CKMB, CKMBINDEX, TROPONINI in the last 168 hours. BNP (last 3 results) No results for input(s): PROBNP in the last 8760 hours. HbA1C: No results for input(s): HGBA1C in the last 72 hours. CBG: Recent Labs  Lab 03/02/19 0206 03/02/19 0709 03/02/19 1125 03/02/19 1716 03/02/19 2038  GLUCAP 119* 165* 151* 147* 163*   Lipid Profile: No results for input(s): CHOL, HDL, LDLCALC, TRIG, CHOLHDL, LDLDIRECT in the last 72 hours. Thyroid Function Tests: No results for input(s): TSH, T4TOTAL,  FREET4, T3FREE, THYROIDAB in the last 72 hours. Anemia Panel: No results for input(s): VITAMINB12, FOLATE, FERRITIN, TIBC, IRON, RETICCTPCT in the last 72 hours. Urine analysis:    Component Value Date/Time   COLORURINE YELLOW 02/16/2019 1457   APPEARANCEUR CLEAR 02/16/2019 1457   LABSPEC 1.013 02/16/2019 1457   PHURINE 6.0 02/16/2019 1457   GLUCOSEU NEGATIVE 02/16/2019 1457   HGBUR NEGATIVE 02/16/2019 1457   BILIRUBINUR NEGATIVE 02/16/2019 1457   KETONESUR NEGATIVE 02/16/2019 1457   PROTEINUR NEGATIVE 02/16/2019 1457   UROBILINOGEN 0.2 03/01/2011 1427   NITRITE NEGATIVE 02/16/2019 1457   LEUKOCYTESUR NEGATIVE 02/16/2019 1457   Sepsis Labs: @LABRCNTIP (procalcitonin:4,lacticidven:4)  ) Recent Results (from the past 240 hour(s))  Culture, blood (routine x 2)     Status: None (Preliminary result)   Collection Time: 02/26/19 10:15 AM  Result Value Ref Range Status   Specimen Description   Final    BLOOD LEFT ARM Performed at [facility], [address] [place], [place] [address]    Special Requests   Final    BOTTLES DRAWN AEROBIC ONLY Blood Culture adequate volume Performed at [facility], [address], [place], [place] [address]    Culture   Final    NO GROWTH 4 DAYS Performed at [facility], [address], [place], [place] [address]    Report Status PENDING  Incomplete  Culture, blood (routine x 2)     Status: None (Preliminary result)   Collection Time: 02/26/19 10:33 AM  Result Value Ref Range Status   Specimen Description   Final    BLOOD LEFT HAND Performed at [facility], [address], [place], [place] [address]    Special Requests   Final    BOTTLES DRAWN AEROBIC ONLY Blood Culture adequate volume Performed at [facility], [address], [place], [place] [address]    Culture   Final    NO GROWTH 4 DAYS Performed at [facility], [address],  [place], [place] [address]    Report Status PENDING  Incomplete      Studies: No results found.  Scheduled Meds: . aspirin  81 mg Oral Daily  . atorvastatin  20 mg Oral Daily  . budesonide (PULMICORT) nebulizer solution  0.25 mg Nebulization BID  . diltiazem  60 mg Oral Q8H  . divalproex  1,500 mg Oral QHS  . enoxaparin (LOVENOX) injection  40 mg Subcutaneous Q24H  . FLUoxetine  40 mg Oral Daily  . folic acid  1 mg Oral Daily  . gabapentin  400 mg Oral Daily   And  . gabapentin  800 mg Oral QHS  . ipratropium-albuterol  3 mL Nebulization Q6H  . methylPREDNISolone (SOLU-MEDROL) injection  40 mg Intravenous Q6H  . metoprolol tartrate  25 mg Oral BID  . multivitamin with minerals  1 tablet Oral Daily  . nystatin   Topical BID  . OLANZapine  15 mg Oral QHS  . sodium chloride flush  10-40 mL Intracatheter Q12H  . sodium chloride flush  10-40 mL Intracatheter Q12H  . thiamine  100 mg Oral Daily    Continuous Infusions: . sodium chloride 250 mL (02/28/19 1412)  . cefTRIAXone (ROCEPHIN)  IV 1 g (03/02/19 1346)  . metronidazole 500 mg (03/03/19 0512)     LOS: 15 days     Myrtie Neither, MD [facility]  To reach me or the doctor on call, go to: [email or web address] Password [email or web address]  03/03/2019, 8:33 AM

## 2019-03-03 NOTE — Consult Note (Signed)
NAME:  Jeffrey [name], MRN:  [id number], DOB:  03-27-1961, LOS: 15 ADMISSION DATE:  02/16/2019, CONSULTATION DATE:  02/16/2019 REFERRING MD:  Drema HalonMatthews, TRH, CHIEF COMPLAINT:  Shortness of breath   HPI/Brief History   58 y/o former smoker with hx of recent Klebsiella pneumonia initially admitted on 3/26 and required intubation from 3/27-4/3. Transferred to floor. However he re-developed fevers and restarted on antibiotics and continued to require 6-8L O2. In the last 48 hours, he has required 15L O2 and transferred to ICU when he began having worsening fatigue and respiratory distress   Past Medical History  ETOH, Polysubstance abuse, Severe COPD with emphysema, HTN, [name], Bipolar, Depression, Chronic diastolic CHF, PAD, PUD  Significant Hospital Events   3/26 Admit 3/27 VDRF 4/01 Off pressors, weaning PEEP  4/3 Extubated  Consults:    Procedures:  ETT 3/27 >>4/3 Lt IJ CVL 3/27 >>  Significant Diagnostic Tests:  CT Chest 02/28/19 - bilateral multifocal airspace disease L>R, new bilateral pleural, effusion, foreign debris in trachea  Micro Data:  Respiratory viral panel 3/26 >> negative Influenza PCR 3/26 >> negative Blood 3/26 >> negative  COVID 3/27 >> Negative Tracheal aspirate 3/27 >> canceled  Urine strep 4/2 >>   Antimicrobials:  Cefepime 3/26 >> 4/2   Interim history/subjective:  Extubated and weaned to 7L O2  Objective   Blood pressure (!) 170/80, pulse (!) 105, temperature 97.8 F (36.6 C), temperature source Oral, resp. rate 20, height 5\' 10"  (1.778 m), weight 64.6 kg, SpO2 94 %.        Intake/Output Summary (Last 24 hours) at 03/03/2019 1818 Last data filed at 03/03/2019 1738 Gross per 24 hour  Intake 1484 ml  Output 1225 ml  Net 259 ml   Filed Weights   02/26/19 0331 02/27/19 0456 03/02/19 0430  Weight: 67.7 kg 66 kg 64.6 kg   Physical Exam: General: Critically ill-appearing, respiratory distress HENT: [place], AT, ETT in place Eyes: EOMI, no scleral  icterus Respiratory: Rhonchus in right side, breath sides on left, no wheezing Cardiovascular: RRR, -M/R/G, no JVD GI: BS+, soft, nontender Extremities:-Edema,-tenderness Neuro: Sedated Skin: Intact, no rashes or bruising  Resolved Hospital Problem list   Hypotension - sedation related   Assessment & Plan:   Acute on Chronic Hypoxic and Hypercarbic Respiratory Failure COPD exacerbation -COVID, RVP negative -Reintubated on 4/10 from suspected aspiration and pulmonary edema P: Intubated Full vent support via ARDSnet protocol Nebulizers ABG in 1 hour Lasix 20 mg now Trach aspirate Continue ceftriaxone Steroids PAD protocol for RASS goal -3  Hypertension P: Continue lopressor to 25 mg BID   Bipolar disorder -history of wernicke's encephalopathy/hepatic encephalopathy P: Continue zyprexa, neurontin, prozac  Continue thiamine / folate    Best practice:  Diet: NPO Pain/Anxiety/Delirium protocol (if indicated): No VAP protocol (if indicated): No DVT prophylaxis: lovenox GI prophylaxis: No Glucose control: NA Mobility:  PT Code Status: Patient  Family Communication: Contacted daughter Herbert SetaHeather and updated on intubation status Disposition: Transfer to ICU  Labs   CBC: Recent Labs  Lab 02/27/19 0441 02/28/19 0333 03/01/19 0344 03/02/19 0518  WBC 10.5 7.7 6.8 5.9  NEUTROABS  --  5.7 5.1 5.5  HGB 10.0* 9.7* 9.7* 10.7*  HCT 33.1* 32.0* 32.3* 35.6*  MCV 88.5 89.1 89.5 88.6  PLT 394 375 384 394    Basic Metabolic Panel: Recent Labs  Lab 02/27/19 0441 02/28/19 0333 03/01/19 0344 03/02/19 0518 03/03/19 0453  NA 140 141 142 139 140  K 3.0* 3.3* 3.6 3.3*  4.2  CL 103 104 107 103 103  CO2 28 28 28 27 27   GLUCOSE 99 98 104* 175* 183*  BUN 15 13 10 11 12   CREATININE 0.48* 0.53* 0.47* 0.45* 0.41*  CALCIUM 8.3* 8.5* 8.3* 8.5* 8.7*  MG  --   --   --   --  1.8  PHOS  --   --   --   --  3.3   GFR: Estimated Creatinine Clearance: 92 mL/min (A) (by C-G formula  based on SCr of 0.41 mg/dL (L)). Recent Labs  Lab 02/26/19 1033 02/27/19 0441 02/28/19 0333 03/01/19 0344 03/02/19 0518  PROCALCITON <0.10  --   --   --   --   WBC  --  10.5 7.7 6.8 5.9    Liver Function Tests: Recent Labs  Lab 02/28/19 0333 03/01/19 0344 03/03/19 0453  AST 18 22  --   ALT 21 22  --   ALKPHOS 171* 273*  --   BILITOT 0.8 0.6  --   PROT 5.9* 5.6*  --   ALBUMIN 2.5* 2.4* 2.5*   No results for input(s): LIPASE, AMYLASE in the last 168 hours. No results for input(s): AMMONIA in the last 168 hours.  ABG    Component Value Date/Time   PHART 7.243 (L) 03/03/2019 1738   PCO2ART 74.4 (HH) 03/03/2019 1738   PO2ART 73.0 (L) 03/03/2019 1738   HCO3 31.0 (H) 03/03/2019 1738   TCO2 34 (H) 12/21/2018 0357   ACIDBASEDEF 0.6 02/17/2019 1850   O2SAT 89.0 03/03/2019 1738     Coagulation Profile: No results for input(s): INR, PROTIME in the last 168 hours.  Cardiac Enzymes: No results for input(s): CKTOTAL, CKMB, CKMBINDEX, TROPONINI in the last 168 hours.  HbA1C: No results found for: HGBA1C  CBG: Recent Labs  Lab 03/02/19 0206 03/02/19 0709 03/02/19 1125 03/02/19 1716 03/02/19 2038  GLUCAP 119* 165* 151* 147* 163*   The patient is critically ill with multiple organ systems failure and requires high complexity decision making for assessment and support, frequent evaluation and titration of therapies, application of advanced monitoring technologies and extensive interpretation of multiple databases.   Critical Care Time devoted to patient care services described in this note is 33 Minutes. This time reflects time of care of this signee Dr. Mechele Collin. This critical care time does not reflect procedure time, or teaching time or supervisory time of PA/NP/Med student/Med Resident etc but could involve care discussion time.  Mechele Collin, M.D. [facility] Pulmonary/Critical Care Medicine Pager: [phone] After hours pager: [phone]

## 2019-03-03 NOTE — Progress Notes (Signed)
Physical Therapy Treatment Patient Details Name: Jeffrey [name] MRN: [id number] DOB: 07-19-1961 Today's Date: 03/03/2019    History of Present Illness 58 yo male admitted with COPD exacerbation 2* PNA and with hx of recent dc from hospital with Klebsiella PNA requiring intubation-VDRF 3/27-4/3.  Pt also with hx of [name], COPD, bipolar, CHF, and polysubstance abuse    PT Comments    Progressing slowly with mobility. O2 sat levels: at rest-90% 10L HFNC; during ambulation-89% 15L HFNC; end of session-93% 10L HFNC. Will continue to follow. Pt is very motivated to mobilize. He requires cues for safety, pacing, rest breaks.     Follow Up Recommendations  Home health PT(depending on progress and medical course)     Equipment Recommendations  None recommended by PT    Recommendations for Other Services       Precautions / Restrictions Precautions Precautions: Fall Precaution Comments: monitor O2; requiring HFNC O2 Restrictions Weight Bearing Restrictions: No    Mobility  Bed Mobility   Bed Mobility: Supine to Sit     Supine to sit: Supervision     General bed mobility comments: for safety, lines. cues for pt to slow down and rest before standing  Transfers Overall transfer level: Needs assistance Equipment used: Rolling walker (2 wheeled) Transfers: Sit to/from Stand Sit to Stand: Min guard         General transfer comment: close guard for safety. VCs safety, hand placement  Ambulation/Gait Ambulation/Gait assistance: Min guard Gait Distance (Feet): 150 Feet Assistive device: Rolling walker (2 wheeled) Gait Pattern/deviations: Step-through pattern;Decreased stride length     General Gait Details: close guard for safety. VCs safety, pacing, rest breaks. Took 1 brief standing rest break. Remained on 15L [place] O2-sat level 89%, dyspnea 3/4.    Stairs             Wheelchair Mobility    Modified Rankin (Stroke Patients Only)       Balance Overall balance  assessment: Needs assistance         Standing balance support: Bilateral upper extremity supported Standing balance-Leahy Scale: Fair                              Cognition Arousal/Alertness: Awake/alert Behavior During Therapy: Impulsive Overall Cognitive Status: Within Functional Limits for tasks assessed                                 General Comments: cues for safety/line management      Exercises      General Comments        Pertinent Vitals/Pain Pain Assessment: No/denies pain    Home Living                      Prior Function            PT Goals (current goals can now be found in the care plan section) Progress towards PT goals: Progressing toward goals    Frequency    Min 3X/week      PT Plan Current plan remains appropriate    Co-evaluation              AM-PAC PT "6 Clicks" Mobility   Outcome Measure  Help needed turning from your back to your side while in a flat bed without using bedrails?: None Help needed moving from lying on your back  to sitting on the side of a flat bed without using bedrails?: A Little Help needed moving to and from a bed to a chair (including a wheelchair)?: A Little Help needed standing up from a chair using your arms (e.g., wheelchair or bedside chair)?: A Little Help needed to walk in hospital room?: A Little Help needed climbing 3-5 steps with a railing? : A Little 6 Click Score: 19    End of Session Equipment Utilized During Treatment: Oxygen Activity Tolerance: Patient tolerated treatment well Patient left: in chair;with call bell/phone within reach   PT Visit Diagnosis: Unsteadiness on feet (R26.81);Difficulty in walking, not elsewhere classified (R26.2)     Time: 8413-2440 PT Time Calculation (min) (ACUTE ONLY): 21 min  Charges:  $Gait Training: 8-22 mins                        Rebeca Alert, PT Acute Rehabilitation Services Pager: [phone] Office:  [phone]

## 2019-03-03 NOTE — Progress Notes (Signed)
Pt O2 levels in the low 80's on 10 L of O2. Pt recently received breathing treatment for SOB. Pt found to be hypoxic, retractions and diaphoretic.  MD was notified of pt decline. Critical care team was notified and pt transferred to icu stepdown due to increased respiratory effort. Critical care team taking over care.

## 2019-03-03 NOTE — Plan of Care (Signed)
  Problem: Health Behavior/Discharge Planning: Goal: Ability to manage health-related needs will improve Outcome: Progressing   Problem: Clinical Measurements: Goal: Will remain free from infection Outcome: Progressing Goal: Diagnostic test results will improve Outcome: Progressing Goal: Cardiovascular complication will be avoided Outcome: Progressing   Problem: Coping: Goal: Level of anxiety will decrease Outcome: Progressing   Problem: Safety: Goal: Ability to remain free from injury will improve Outcome: Progressing   Problem: Clinical Measurements: Goal: Ability to maintain clinical measurements within normal limits will improve Outcome: Not Progressing Goal: Respiratory complications will improve Outcome: Not Progressing   Problem: Activity: Goal: Risk for activity intolerance will decrease Outcome: Not Progressing   Problem: Nutrition: Goal: Adequate nutrition will be maintained Outcome: Adequate for Discharge   Problem: Elimination: Goal: Will not experience complications related to bowel motility Outcome: Adequate for Discharge Goal: Will not experience complications related to urinary retention Outcome: Adequate for Discharge   Problem: Pain Managment: Goal: General experience of comfort will improve Outcome: Adequate for Discharge   Problem: Skin Integrity: Goal: Risk for impaired skin integrity will decrease Outcome: Adequate for Discharge

## 2019-03-03 NOTE — Progress Notes (Signed)
Occupational Therapy Treatment Patient Details Name: Jeffrey [name] MRN: [id number] DOB: 1960/12/30 Today's Date: 03/03/2019    History of present illness 58 yo male admitted with COPD exacerbation 2* PNA and with hx of recent dc from hospital with Klebsiella PNA requiring intubation-VDRF 3/27-4/3.  Pt also with hx of [name], COPD, bipolar, CHF, and polysubstance abuse   OT comments  Pt required increased 02 and still desaturated to 84% twice on 15 liters. Encouraged rest breaks. Reinforced need for these and also to self monitor for increased WOB.  Needs reinforcement    Follow Up Recommendations  Home health OT;Supervision/Assistance - 24 hour    Equipment Recommendations  None recommended by OT    Recommendations for Other Services      Precautions / Restrictions Precautions Precautions: Fall Precaution Comments: monitor O2; requiring HFNC O2 Restrictions Weight Bearing Restrictions: No       Mobility Bed Mobility                  Transfers       Sit to Stand: Min guard         General transfer comment: close guard for safety. VCs safety, hand placement    Balance                                           ADL either performed or assessed with clinical judgement   ADL                           Toilet Transfer: Min guard;Ambulation;Comfort height toilet;RW   Toileting- Clothing Manipulation and Hygiene: Supervision/safety;Sitting/lateral lean         General ADL Comments: ambulated to bathroom; 02 bumped up to 15 liters and sats 84%.  Pt recovered slowly with rest break. Also walked outside of door after recovery and sats 84%.  Recovered to 91%.  He was 95% at rest.  RN bumped 02 up in room. He was initially on 10 liters. Continued education on need for frequent rest breaks and recovery. Pt looks at sats and doesn't pay as much attention to WOB     Vision       Perception     Praxis      Cognition  Arousal/Alertness: Awake/alert Behavior During Therapy: Impulsive                                   General Comments: cues for safety        Exercises     Shoulder Instructions       General Comments      Pertinent Vitals/ Pain       Pain Assessment: No/denies pain  Home Living                                          Prior Functioning/Environment              Frequency  Min 2X/week        Progress Toward Goals  OT Goals(current goals can now be found in the care plan section)  Progress towards OT goals: Progressing toward goals     Plan  Co-evaluation                 AM-PAC OT "6 Clicks" Daily Activity     Outcome Measure   Help from another person eating meals?: A Little Help from another person taking care of personal grooming?: A Little Help from another person toileting, which includes using toliet, bedpan, or urinal?: A Little Help from another person bathing (including washing, rinsing, drying)?: A Little Help from another person to put on and taking off regular upper body clothing?: A Little Help from another person to put on and taking off regular lower body clothing?: A Little 6 Click Score: 18    End of Session    OT Visit Diagnosis: Unsteadiness on feet (R26.81);Muscle weakness (generalized) (M62.81)   Activity Tolerance Patient limited by fatigue(and sats)   Patient Left in bed;with call bell/phone within reach;with bed alarm set   Nurse Communication (sats)        Time: 1610-9604 OT Time Calculation (min): 30 min  Charges: OT General Charges $OT Visit: 1 Visit OT Treatments $Self Care/Home Management : 8-22 mins $Therapeutic Activity: 8-22 mins  Marica Otter, OTR/L Acute Rehabilitation Services [phone] [facility] pager [phone] office 03/03/2019   Mckoy Bhakta 03/03/2019, 4:07 PM

## 2019-03-04 DIAGNOSIS — I5033 Acute on chronic diastolic (congestive) heart failure: Secondary | ICD-10-CM

## 2019-03-04 LAB — CBC WITH DIFFERENTIAL/PLATELET
Abs Immature Granulocytes: 0.02 10*3/uL (ref 0.00–0.07)
Basophils Absolute: 0 10*3/uL (ref 0.0–0.1)
Basophils Relative: 0 %
Eosinophils Absolute: 0.2 10*3/uL (ref 0.0–0.5)
Eosinophils Relative: 3 %
HCT: 30.9 % — ABNORMAL LOW (ref 39.0–52.0)
Hemoglobin: 9.2 g/dL — ABNORMAL LOW (ref 13.0–17.0)
Immature Granulocytes: 0 %
Lymphocytes Relative: 8 %
Lymphs Abs: 0.5 10*3/uL — ABNORMAL LOW (ref 0.7–4.0)
MCH: 26.4 pg (ref 26.0–34.0)
MCHC: 29.8 g/dL — ABNORMAL LOW (ref 30.0–36.0)
MCV: 88.8 fL (ref 80.0–100.0)
Monocytes Absolute: 0.3 10*3/uL (ref 0.1–1.0)
Monocytes Relative: 6 %
Neutro Abs: 4.7 10*3/uL (ref 1.7–7.7)
Neutrophils Relative %: 83 %
Platelets: 308 10*3/uL (ref 150–400)
RBC: 3.48 MIL/uL — ABNORMAL LOW (ref 4.22–5.81)
RDW: 13.4 % (ref 11.5–15.5)
WBC: 5.6 10*3/uL (ref 4.0–10.5)
nRBC: 0 % (ref 0.0–0.2)

## 2019-03-04 LAB — GLUCOSE, CAPILLARY: Glucose-Capillary: 141 mg/dL — ABNORMAL HIGH (ref 70–99)

## 2019-03-04 LAB — BASIC METABOLIC PANEL
Anion gap: 12 (ref 5–15)
BUN: 19 mg/dL (ref 6–20)
CO2: 24 mmol/L (ref 22–32)
Calcium: 8.6 mg/dL — ABNORMAL LOW (ref 8.9–10.3)
Chloride: 104 mmol/L (ref 98–111)
Creatinine, Ser: 0.52 mg/dL — ABNORMAL LOW (ref 0.61–1.24)
GFR calc Af Amer: >60 mL/min (ref >60)
GFR calc non Af Amer: >60 mL/min (ref >60)
Glucose, Bld: 149 mg/dL — ABNORMAL HIGH (ref 70–99)
Potassium: 4.5 mmol/L (ref 3.5–5.1)
Sodium: 140 mmol/L (ref 135–145)

## 2019-03-04 MED ORDER — FUROSEMIDE 10 MG/ML IJ SOLN
40.0000 mg | Freq: Once | INTRAMUSCULAR | Status: AC
  Administered 2019-03-04: 40 mg via INTRAVENOUS
  Filled 2019-03-04: qty 4

## 2019-03-04 MED ORDER — ORAL CARE MOUTH RINSE
15.0000 mL | OROMUCOSAL | Status: DC
  Administered 2019-03-04 – 2019-03-05 (×13): 15 mL via OROMUCOSAL

## 2019-03-04 MED ORDER — CHLORHEXIDINE GLUCONATE 0.12% ORAL RINSE (MEDLINE KIT)
15.0000 mL | Freq: Two times a day (BID) | OROMUCOSAL | Status: DC
  Administered 2019-03-04 – 2019-03-05 (×4): 15 mL via OROMUCOSAL

## 2019-03-04 NOTE — Progress Notes (Signed)
Patients daughter [name] called for updates at this time.

## 2019-03-04 NOTE — Plan of Care (Signed)
Patient continues improving.  Pt is tolerating ventilation well.

## 2019-03-04 NOTE — Progress Notes (Signed)
Video call completed with daughter, [name]. Pt smiling, writing and communicating with me and daughter despite vent.

## 2019-03-04 NOTE — Progress Notes (Signed)
NAME:  Jeffrey [name], MRN:  [id number], DOB:  Apr 18, 1961, LOS: 16 ADMISSION DATE:  02/16/2019, CONSULTATION DATE:  02/16/2019 REFERRING MD:  Drema Halon, CHIEF COMPLAINT:  Shortness of breath   HPI/Brief History   58 y/o former smoker with hx of recent Klebsiella pneumonia initially admitted on 3/26 and required intubation from 3/27-4/3. Transferred to floor. However he re-developed fevers and restarted on antibiotics and continued to require 6-8L O2. In the last 48 hours, he has required 15L O2 and transferred to ICU when he began having worsening fatigue and respiratory distress   Past Medical History  ETOH, Polysubstance abuse, Severe COPD with emphysema, HTN, [name], Bipolar, Depression, Chronic diastolic CHF, PAD, PUD  Significant Hospital Events   3/26 Admit 3/27 VDRF  4/01 Off pressors, weaning PEEP   4/3 Extubated  4/10 Re-intubated    Consults:    Procedures:  ETT 3/27 > 4/3 Lt IJ CVL 3/27 > 4/7  ETT 4/10 >>   Significant Diagnostic Tests:  CT Chest 02/28/19 - bilateral multifocal airspace disease L>R, new bilateral pleural, effusion, foreign debris in trachea  Micro Data:  Respiratory viral panel 3/26 > negative Influenza PCR 3/26 > negative Blood 3/26 > negative  COVID 3/27 > Negative Urine strep 4/2 > negative   Antimicrobials:  Cefepime 3/26 > 4/2   Interim history/subjective:  Remains intubated, Diuresed, Weaning now   Objective   Blood pressure 134/70, pulse 63, temperature 98.7 F (37.1 C), temperature source Axillary, resp. rate (!) 30, height 5\' 10"  (1.778 m), weight 64.6 kg, SpO2 98 %.    Vent Mode: PRVC FiO2 (%):  [50 %-100 %] 50 % Set Rate:  [30 bmp] 30 bmp Vt Set:  [430 mL] 430 mL PEEP:  [5 cmH20] 5 cmH20 Plateau Pressure:  [18 cmH20-26 cmH20] 22 cmH20   Intake/Output Summary (Last 24 hours) at 03/04/2019 1207 Last data filed at 03/04/2019 0600 Gross per 24 hour  Intake 994.7 ml  Output 600 ml  Net 394.7 ml   Filed Weights   02/26/19 0331 02/27/19 0456 03/02/19 0430  Weight: 67.7 kg 66 kg 64.6 kg   Physical Exam: General: Adult male, on vent, no distress  HENT: [place], AT, ETT in place, pupils intact  Respiratory: Diminished to bases, no wheeze/crackles  Cardiovascular: RRR, no MRG GI: active bowel sounds, non-tender  Extremities -edema  Neuro: alert, oriented, follows commands  Skin: Intact   Resolved Hospital Problem list   Hypotension - sedation related   Assessment & Plan:   Acute on Chronic Hypoxic and Hypercarbic Respiratory Failure with Multifocal PNA and Pulmonary Edema, Aspiration Event  AECOPD  -COVID, RVP negative -Reintubated on 4/10 from suspected aspiration and pulmonary edema P: Vent Support Wean As able > weaning now  Pulmonary Hygiene  Scheduled Nebs  Solu-medrol 60 mg daily  Continue ceftriaxone Holding Precedex currently as weaning  Lasix 40 mg now   Hypertension [name]  Chronic Diastolic HF P: Cardiac Monitoring  Continue lopressor to 25 mg BID  Continue Cardizem   Dysphagia Plan Once Extubated Continue with ST and Dysphagia diet   Bipolar disorder, ETOH, Polysubstance Abuse  -history of wernicke's encephalopathy/hepatic encephalopathy P: Continue zyprexa, neurontin, prozac  Continue thiamine / folate    Best practice:  Diet: NPO Pain/Anxiety/Delirium protocol (if indicated): No VAP protocol (if indicated): No DVT prophylaxis: lovenox GI prophylaxis: No Glucose control: NA Mobility:  PT Code Status: Patient  Family Communication: Family updated  Disposition: continue to ICU   Labs   CBC:  Recent Labs  Lab 02/27/19 0441 02/28/19 0333 03/01/19 0344 03/02/19 0518 03/04/19 0258  WBC 10.5 7.7 6.8 5.9 5.6  NEUTROABS  --  5.7 5.1 5.5 4.7  HGB 10.0* 9.7* 9.7* 10.7* 9.2*  HCT 33.1* 32.0* 32.3* 35.6* 30.9*  MCV 88.5 89.1 89.5 88.6 88.8  PLT 394 375 384 394 308    Basic Metabolic Panel: Recent Labs  Lab 02/28/19 0333 03/01/19 0344 03/02/19 0518 03/03/19  0453 03/04/19 0258  NA 141 142 139 140 140  K 3.3* 3.6 3.3* 4.2 4.5  CL 104 107 103 103 104  CO2 28 28 27 27 24   GLUCOSE 98 104* 175* 183* 149*  BUN 13 10 11 12 19   CREATININE 0.53* 0.47* 0.45* 0.41* 0.52*  CALCIUM 8.5* 8.3* 8.5* 8.7* 8.6*  MG  --   --   --  1.8  --   PHOS  --   --   --  3.3  --    GFR: Estimated Creatinine Clearance: 92 mL/min (A) (by C-G formula based on SCr of 0.52 mg/dL (L)). Recent Labs  Lab 02/26/19 1033  02/28/19 0333 03/01/19 0344 03/02/19 0518 03/04/19 0258  PROCALCITON <0.10  --   --   --   --   --   WBC  --    < > 7.7 6.8 5.9 5.6   < > = values in this interval not displayed.    Liver Function Tests: Recent Labs  Lab 02/28/19 0333 03/01/19 0344 03/03/19 0453  AST 18 22  --   ALT 21 22  --   ALKPHOS 171* 273*  --   BILITOT 0.8 0.6  --   PROT 5.9* 5.6*  --   ALBUMIN 2.5* 2.4* 2.5*   No results for input(s): LIPASE, AMYLASE in the last 168 hours. No results for input(s): AMMONIA in the last 168 hours.  ABG    Component Value Date/Time   PHART 7.348 (L) 03/03/2019 2039   PCO2ART 58.5 (H) 03/03/2019 2039   PO2ART 148 (H) 03/03/2019 2039   HCO3 31.3 (H) 03/03/2019 2039   TCO2 34 (H) 12/21/2018 0357   ACIDBASEDEF 0.6 02/17/2019 1850   O2SAT 99.2 03/03/2019 2039     Coagulation Profile: No results for input(s): INR, PROTIME in the last 168 hours.  Cardiac Enzymes: No results for input(s): CKTOTAL, CKMB, CKMBINDEX, TROPONINI in the last 168 hours.  HbA1C: No results found for: HGBA1C  CBG: Recent Labs  Lab 03/02/19 0709 03/02/19 1125 03/02/19 1716 03/02/19 2038 03/04/19 0756  GLUCAP 165* 151* 147* 163* 141*   CC Time: 32 minutes  Jovita KussmaulKatalina Eubanks, AGACNP-BC [facility]  PCCM Pgr: [phone]

## 2019-03-04 NOTE — Progress Notes (Signed)
PT Cancellation Note  Patient Details Name: Jeffrey [name] MRN: [id number] DOB: 1961/10/27   Cancelled Treatment:     pt transferred to ICU with medical decline. Will attempt to see another day as schedule permits   Felecia Shelling  PTA Acute  Rehabilitation Services Pager      [phone] Office      [phone]

## 2019-03-05 LAB — BASIC METABOLIC PANEL
Anion gap: 9 (ref 5–15)
BUN: 23 mg/dL — ABNORMAL HIGH (ref 6–20)
CO2: 34 mmol/L — ABNORMAL HIGH (ref 22–32)
Calcium: 8.6 mg/dL — ABNORMAL LOW (ref 8.9–10.3)
Chloride: 101 mmol/L (ref 98–111)
Creatinine, Ser: 0.41 mg/dL — ABNORMAL LOW (ref 0.61–1.24)
GFR calc Af Amer: >60 mL/min (ref >60)
GFR calc non Af Amer: >60 mL/min (ref >60)
Glucose, Bld: 120 mg/dL — ABNORMAL HIGH (ref 70–99)
Potassium: 3.5 mmol/L (ref 3.5–5.1)
Sodium: 144 mmol/L (ref 135–145)

## 2019-03-05 LAB — PHOSPHORUS: Phosphorus: 4.5 mg/dL (ref 2.5–4.6)

## 2019-03-05 LAB — CBC
HCT: 35.1 % — ABNORMAL LOW (ref 39.0–52.0)
Hemoglobin: 10.5 g/dL — ABNORMAL LOW (ref 13.0–17.0)
MCH: 26.3 pg (ref 26.0–34.0)
MCHC: 29.9 g/dL — ABNORMAL LOW (ref 30.0–36.0)
MCV: 88 fL (ref 80.0–100.0)
Platelets: 305 10*3/uL (ref 150–400)
RBC: 3.99 MIL/uL — ABNORMAL LOW (ref 4.22–5.81)
RDW: 13.4 % (ref 11.5–15.5)
WBC: 5.9 10*3/uL (ref 4.0–10.5)
nRBC: 0 % (ref 0.0–0.2)

## 2019-03-05 LAB — MAGNESIUM: Magnesium: 2.2 mg/dL (ref 1.7–2.4)

## 2019-03-05 MED ORDER — VALPROIC ACID 250 MG PO CAPS
500.0000 mg | ORAL_CAPSULE | Freq: Three times a day (TID) | ORAL | Status: AC
  Administered 2019-03-05 – 2019-03-06 (×6): 500 mg via ORAL
  Filled 2019-03-05 (×6): qty 2

## 2019-03-05 MED ORDER — FUROSEMIDE 10 MG/ML PO SOLN
20.0000 mg | Freq: Once | ORAL | Status: DC
  Filled 2019-03-05: qty 2

## 2019-03-05 MED ORDER — FUROSEMIDE 10 MG/ML IJ SOLN
40.0000 mg | Freq: Once | INTRAMUSCULAR | Status: AC
  Administered 2019-03-05: 40 mg via INTRAVENOUS
  Filled 2019-03-05: qty 4

## 2019-03-05 MED ORDER — ORAL CARE MOUTH RINSE
15.0000 mL | Freq: Two times a day (BID) | OROMUCOSAL | Status: DC
  Administered 2019-03-05 – 2019-03-06 (×2): 15 mL via OROMUCOSAL

## 2019-03-05 MED ORDER — METHYLPREDNISOLONE SODIUM SUCC 40 MG IJ SOLR
40.0000 mg | Freq: Every day | INTRAMUSCULAR | Status: DC
  Administered 2019-03-06: 40 mg via INTRAVENOUS
  Filled 2019-03-05: qty 1

## 2019-03-05 MED ORDER — CHLORHEXIDINE GLUCONATE 0.12 % MT SOLN
15.0000 mL | Freq: Two times a day (BID) | OROMUCOSAL | Status: DC
  Administered 2019-03-06 (×2): 15 mL via OROMUCOSAL
  Filled 2019-03-05 (×2): qty 15

## 2019-03-05 NOTE — Progress Notes (Addendum)
NAME:  Jeffrey [name], MRN:  [id number], DOB:  [birth year], LOS: 17 ADMISSION DATE:  02/16/2019, CONSULTATION DATE:  02/16/2019 REFERRING MD:  Drema Halon, CHIEF COMPLAINT:  Shortness of breath   HPI/Brief History   58 y/o former smoker with hx of recent Klebsiella pneumonia initially admitted on 3/26 and required intubation from 3/27-4/3. Transferred to floor. However he re-developed fevers and restarted on antibiotics and continued to require 6-8L O2. In the last 48 hours, he has required 15L O2 and transferred to ICU when he began having worsening fatigue and respiratory distress   Past Medical History  ETOH, Polysubstance abuse, Severe COPD with emphysema, HTN, [name], Bipolar, Depression, Chronic diastolic CHF, PAD, PUD  Significant Hospital Events   3/26 Admit 3/27 VDRF  4/01 Off pressors, weaning PEEP   4/3 Extubated  4/10 Re-intubated   03/05/2019 extubated 4/12 Report given to Dr. Blake Divine of [facility].  Consults:    Procedures:  ETT 3/27 > 4/3 Lt IJ CVL 3/27 > 4/7  ETT 4/10 >> 03/05/2019  Significant Diagnostic Tests:  CT Chest 02/28/19 - bilateral multifocal airspace disease L>R, new bilateral pleural, effusion, foreign debris in trachea  Micro Data:  Respiratory viral panel 3/26 > negative Influenza PCR 3/26 > negative Blood 3/26 > negative  COVID 3/27 > Negative Urine strep 4/2 > negative   Antimicrobials:  Cefepime 3/26 > 4/2   Interim history/subjective:  Extubated no acute distress  Objective   Blood pressure (!) 165/80, pulse (!) 109, temperature 97.7 F (36.5 C), temperature source Axillary, resp. rate (!) 24, height 5\' 10"  (1.778 m), weight 63 kg, SpO2 90 %.    Vent Mode: PSV FiO2 (%):  [40 %-50 %] 44 % Set Rate:  [15 bmp] 15 bmp Vt Set:  [310 mL-430 mL] 430 mL PEEP:  [5 cmH20] 5 cmH20 Pressure Support:  [5 cmH20-21 cmH20] 5 cmH20 Plateau Pressure:  [14 cmH20-22 cmH20] 16 cmH20   Intake/Output Summary (Last 24 hours) at 03/05/2019 1011 Last data  filed at 03/05/2019 0600 Gross per 24 hour  Intake 524.47 ml  Output 3750 ml  Net -3225.53 ml   Filed Weights   02/27/19 0456 03/02/19 0430 03/05/19 0630  Weight: 66 kg 64.6 kg 63 kg   Physical Exam: General: Male who appears older than his stated age, cachectic HEENT: Edentulous, no JVD or lymphadenopathy is appreciated Neuro: Awake alert no acute distress CV: s1s2 rrr, no m/r/g PULM: even/non-labored, lungs bilaterally diminished throughout YT:KZSW, non-tender, bsx4 active  Extremities: warm/dry, negative edema  Skin: no rashes or lesions   Resolved Hospital Problem list   Hypotension - sedation related   Assessment & Plan:   Acute on Chronic Hypoxic and Hypercarbic Respiratory Failure with Multifocal PNA and Pulmonary Edema, Aspiration Event  AECOPD  -COVID, RVP negative -Reintubated on 4/10 from suspected aspiration and pulmonary edema P:  extubated 03/05/2019 Bronchodilators Dysphagia diet for chronic aspiration Oxygen as needed Transferred to [facility] service as of 03/06/2019 He may be able to move out of the intensive care unit on 03/06/2019 No he is a full code per his request. 20 mg p.o. Lasix 03/05/2019 he may be able to go back on his daily dose.   Hypertension [name]  Chronic Diastolic HF P: Continue cardiac monitoring  Lopressor twice daily Continue cardio exam  Dysphagia Plan He was extubated on 03/05/2019 Documentation he requires a dysphagia 3 diet  Bipolar disorder, ETOH, Polysubstance Abuse  -history of wernicke's encephalopathy/hepatic encephalopathy P: Continue Zyprexa, Neurontin, Prozac Continue thiamine and  folic acid  Best practice:  Diet: Dysphagia 3 diet Pain/Anxiety/Delirium protocol (if indicated): No VAP protocol (if indicated): No DVT prophylaxis: lovenox GI prophylaxis: No Glucose control: NA Mobility:  PT Code Status: Patient  Family Communication: Family updated  Disposition: Intensive care within 24 hours post extubation   Labs   CBC: Recent Labs  Lab 02/27/19 0441 02/28/19 0333 03/01/19 0344 03/02/19 0518 03/04/19 0258  WBC 10.5 7.7 6.8 5.9 5.6  NEUTROABS  --  5.7 5.1 5.5 4.7  HGB 10.0* 9.7* 9.7* 10.7* 9.2*  HCT 33.1* 32.0* 32.3* 35.6* 30.9*  MCV 88.5 89.1 89.5 88.6 88.8  PLT 394 375 384 394 308    Basic Metabolic Panel: Recent Labs  Lab 02/28/19 0333 03/01/19 0344 03/02/19 0518 03/03/19 0453 03/04/19 0258  NA 141 142 139 140 140  K 3.3* 3.6 3.3* 4.2 4.5  CL 104 107 103 103 104  CO2 28 28 27 27 24   GLUCOSE 98 104* 175* 183* 149*  BUN 13 10 11 12 19   CREATININE 0.53* 0.47* 0.45* 0.41* 0.52*  CALCIUM 8.5* 8.3* 8.5* 8.7* 8.6*  MG  --   --   --  1.8  --   PHOS  --   --   --  3.3  --    GFR: Estimated Creatinine Clearance: 89.7 mL/min (A) (by C-G formula based on SCr of 0.52 mg/dL (L)). Recent Labs  Lab 02/26/19 1033  02/28/19 0333 03/01/19 0344 03/02/19 0518 03/04/19 0258  PROCALCITON <0.10  --   --   --   --   --   WBC  --    < > 7.7 6.8 5.9 5.6   < > = values in this interval not displayed.    Liver Function Tests: Recent Labs  Lab 02/28/19 0333 03/01/19 0344 03/03/19 0453  AST 18 22  --   ALT 21 22  --   ALKPHOS 171* 273*  --   BILITOT 0.8 0.6  --   PROT 5.9* 5.6*  --   ALBUMIN 2.5* 2.4* 2.5*   No results for input(s): LIPASE, AMYLASE in the last 168 hours. No results for input(s): AMMONIA in the last 168 hours.  ABG    Component Value Date/Time   PHART 7.348 (L) 03/03/2019 2039   PCO2ART 58.5 (H) 03/03/2019 2039   PO2ART 148 (H) 03/03/2019 2039   HCO3 31.3 (H) 03/03/2019 2039   TCO2 34 (H) 12/21/2018 0357   ACIDBASEDEF 0.6 02/17/2019 1850   O2SAT 99.2 03/03/2019 2039     Coagulation Profile: No results for input(s): INR, PROTIME in the last 168 hours.  Cardiac Enzymes: No results for input(s): CKTOTAL, CKMB, CKMBINDEX, TROPONINI in the last 168 hours.  HbA1C: No results found for: HGBA1C  CBG: Recent Labs  Lab 03/02/19 0709 03/02/19 1125  03/02/19 1716 03/02/19 2038 03/04/19 0756  GLUCAP 165* 151* 147* 163* 141*   CC Time: 31 min  Steve Christ Fullenwider ACNP [facility] PCCM Pager [phone] till 1 pm If no answer page [phone] 03/05/2019, 10:11 AM

## 2019-03-05 NOTE — Progress Notes (Signed)
Patient extubated per MD's order, placed on 6LNC, SATS 92%, patient able to vocalize in no respiratory distress, patient is able to cough and clear his own secretions, will continue to monitor patient.

## 2019-03-06 ENCOUNTER — Inpatient Hospital Stay (HOSPITAL_COMMUNITY): Payer: Medicare Other

## 2019-03-06 LAB — EXPECTORATED SPUTUM ASSESSMENT W GRAM STAIN, RFLX TO RESP C

## 2019-03-06 LAB — BASIC METABOLIC PANEL
Anion gap: 8 (ref 5–15)
BUN: 23 mg/dL — ABNORMAL HIGH (ref 6–20)
CO2: 37 mmol/L — ABNORMAL HIGH (ref 22–32)
Calcium: 8.5 mg/dL — ABNORMAL LOW (ref 8.9–10.3)
Chloride: 98 mmol/L (ref 98–111)
Creatinine, Ser: 0.55 mg/dL — ABNORMAL LOW (ref 0.61–1.24)
GFR calc Af Amer: >60 mL/min (ref >60)
GFR calc non Af Amer: >60 mL/min (ref >60)
Glucose, Bld: 87 mg/dL (ref 70–99)
Potassium: 3.1 mmol/L — ABNORMAL LOW (ref 3.5–5.1)
Sodium: 143 mmol/L (ref 135–145)

## 2019-03-06 LAB — PHOSPHORUS: Phosphorus: 3.8 mg/dL (ref 2.5–4.6)

## 2019-03-06 LAB — MAGNESIUM: Magnesium: 2.1 mg/dL (ref 1.7–2.4)

## 2019-03-06 MED ORDER — DILTIAZEM HCL ER COATED BEADS 180 MG PO CP24
180.0000 mg | ORAL_CAPSULE | Freq: Every day | ORAL | Status: DC
  Administered 2019-03-06 – 2019-03-07 (×2): 180 mg via ORAL
  Filled 2019-03-06 (×2): qty 1

## 2019-03-06 MED ORDER — DIVALPROEX SODIUM ER 250 MG PO TB24: 1500.0000 mg | ORAL_TABLET | Freq: Every day | ORAL | Status: DC

## 2019-03-06 MED ORDER — POTASSIUM CHLORIDE CRYS ER 20 MEQ PO TBCR
40.0000 meq | EXTENDED_RELEASE_TABLET | ORAL | Status: AC
  Administered 2019-03-06 (×2): 40 meq via ORAL
  Filled 2019-03-06 (×2): qty 2

## 2019-03-06 MED ORDER — ENSURE ENLIVE PO LIQD
237.0000 mL | Freq: Two times a day (BID) | ORAL | Status: DC
  Administered 2019-03-06: 237 mL via ORAL

## 2019-03-06 NOTE — Progress Notes (Signed)
Interim   58 year old male Polysubstance abuse COPD stage III bipolar hypertension hyperlipidemia chronic diastolic heart failure  Prior hospitalization 12/16/2018-01/13/2019 Rx Klebsiella pneumonia acute respiratory failure had intubation mechanical ventilation Hospitalization complicated Wernicke's encephalopathy and hepatic encephalopathy  Readmitted 3/26 [facility] chills fever cough S OB On admission septic tachycardic started on Vanco meropenem AST/ALT 182/130 alk phos 420 viral panel for influenza RVP coronavirus obtained]\  Initially seen and then intubated by critical care 3/27 transferred to floor on 4/3-required high flow oxygen 15 L reintubated 4/10 and extubated 1 more time 4/12 [facility] has resumed attending management of care as of 4/13 a.m.  Impression  VDRF Underlying Gold stage COPD 3 Hypoxia and respiratory failure ?  VAP Solu-Medrol until prednisone taper 4/14 currently ceftriaxone 4/5 but no temperature curve--respiratory culture 4/13 pending CXR this a.m. my read RUL disease, persisting LUL pneumonia Acute decompensated diastolic heart failure, last EF 65-70% 12/19/2018- Moderate mitral valve stenosis Sinus tachycardia Net -6.3 L, weight down 64-->60.3 kg Holding Lasix at this time Check a.m. magnesium Favor CCB >beta-blocker secondary to COPD-we will consolidate Cardizem to 180 CD today OP consideration Rpt echo for MoV Chronic ethanolism [quit 6 mo ago?] Bipolar Continue gabapentin 4oo am, 800 P, Depakote 500 3 times daily Last CIWA unknown but patient is clearly out of concerns for withdrawal Smoker[quit 6 mo ago?] Counseled for 10 minutes regarding cessation Renal Replace potassium, check magnesium, hold Lasix given mild prerenal azotemia Would check LFTs a.m. PAD Needs outpatient screening and management GI/fen History of Wernicke's, hepatic encephalopathy with probable underlying cirrhosis Ultrasound 4/4 2008 significant for  hemangioma-needs correlation and screening as an outpatient by PCP would screen also for hepatitis C on discharge H/o GI bleed 03/2017 needs outpatient screening per protocols by Dr. fields eventually Currently on dysphagia 3 diet and resource thickened-defer to speech therapy further management     Subjective/interval events   No issue sovernight-weaned to 6 liters rops to 88 when talking Eating well without cough, fever cold sore throat No cp Stool yesterday wnl   Objective     BP (!) 142/73   Pulse 75   Temp 98.4 F (36.9 C) (Oral)   Resp 19   Ht _0  (1.778 m)   Wt 60.3 kg   SpO2 91%   BMI 19.07 kg/m      Intake/Output Summary (Last 24 hours) at 03/06/2019 0719 Last data filed at 03/06/2019 0600 Gross per 24 hour  Intake 554.64 ml  Output 3001 ml  Net -2446.36 ml    GPE Awake older looking than age asthenic coherent GCS 15 pupils 4 mm No SM lan, edentulous, no jvd s1 s2 no m/r/g cta b no added sound no rales, rhonchi abd sof tnt nd no rebound No LE edema  Extubated at this time on  DATA personally reviewed as of 03/06/19 and significant findings addressed below  Hypokalemia 3.1 BUN/creatinine 23/1 up from baseline Hemoglobin is 10 MCV is 88 platelets of 300 Blood sugar ranges between 87 and 149 Chest x-ray over read shows emphysematous changes with hyperinflation costophrenic angles are clear right middle lobe and left upper lobe pneumonia  Antibiotic/cultures  As above Sputum culture from 4/13 is pending  Best practice:   VAP bundle ordered: Yes Glycemic control ordered: Yes SUP w/ yes DVT prophylaxis w: Yes Disposition --- disposition is unclear at this time he is debilitated we will get therapy service involved including speech therapy and he will probably need institutionalization/short-term skilled facility placement  Code status:    GOC discussion not done Will call family later today    Critical care time 43  DISCLAIMER This patient  was evaluated at the [facility] in context of the [place] COVID-19 pandemic.  Institutional protocols and algorithms to patient care with COVID-19 are in a state of rapid change based on information from regulatory bodies including the CDC and federal and state organizations.  We adhered to rapidly changing policies and algorithms to the highest degree possible.

## 2019-03-06 NOTE — Progress Notes (Signed)
Nutrition Follow-up  RD working remotely.   DOCUMENTATION CODES:   Not applicable  INTERVENTION:  - will order Ensure Enlive BID, each supplement provides 350 kcal and 20 grams of protein. - continue to encourage PO intakes.    NUTRITION DIAGNOSIS:   Inadequate oral intake related to acute illness, decreased appetite, other (see comment)(current diet order) as evidenced by per patient/family report, meal completion < 50%. -ongoing, improving   GOAL:   Patient will meet greater than or equal to 90% of their needs -unmet at this time  MONITOR:   PO intake, Supplement acceptance, Labs, Weight trends  ASSESSMENT:   58 y/o former smoker admitted with dyspnea, cough fever after a recent hospital discharge for Klebsiella pneumonia requiring intubation. Required intubation for worsening hypoxemia on 3/27.  Weight continues to trend down and is now -5.5 kg/8 lb compared to admission (3/26) weight. Diet advanced from Dysphagia 1, nectar-thick to Dysphagia 3, thin liquids on 4/6 at 2:20 PM and then patient was NPO 4/11 at 11:50 AM-4/12 at 10:20 AM.   Per review of RN flow sheet, last documented PO intakes were 100% of breakfast and lunch on 4/9 (total of 700 kcal, 29 grams protein) and 100% of breakfast and 60% of lunch on 4/10 (total of 1053 kcal, 50 grams protein).   Medications reviewed; 1 mg folvite/day, 40 mg solu-medrol/day x3 days starting 4/13, 40 mEq K-Dur x2 doses 4/13, 100 mg thiamine/day. Labs reviewed; K: 3.1 mmol/l, BUN: 23 mg/dl, creatinine: 4.94 mg/dl, Ca: 8.5 mg/dl.    Diet Order:   Diet Order            DIET DYS 3 Room service appropriate? Yes; Fluid consistency: Thin  Diet effective now              EDUCATION NEEDS:   Not appropriate for education at this time  Skin:  Skin Assessment: Reviewed RN Assessment  Last BM:  4/13  Height:   Ht Readings from Last 1 Encounters:  02/16/19 5\' 10"  (1.778 m)    Weight:   Wt Readings from Last 1 Encounters:   03/06/19 60.3 kg    Ideal Body Weight:  75.5 kg  BMI:  Body mass index is 19.07 kg/m.  Estimated Nutritional Needs:   Kcal:  2115-2310 kcal  Protein:  85-100 grams  Fluid:  1.8L/day     Trenton Gammon, MS, RD, LDN, [facility] Inpatient Clinical Dietitian Pager # [phone] After hours/weekend pager # [phone]

## 2019-03-06 NOTE — Progress Notes (Signed)
Occupational Therapy Treatment Patient Details Name: Jeffrey [name] MRN: [id number] DOB: [birth year] Today's Date: 03/06/2019    History of present illness 58 yo male admitted with COPD exacerbation 2* PNA and with hx of recent dc from hospital with Klebsiella PNA requiring intubation-VDRF 3/27-4/3.  Pt also with hx of [name], COPD, bipolar, CHF, and polysubstance abuse   OT comments  Goal added for BUE Exercise.    Follow Up Recommendations  Home health OT;Supervision/Assistance - 24 hour    Equipment Recommendations  None recommended by OT    Recommendations for Other Services      Precautions / Restrictions Precautions Precautions: Fall Precaution Comments: monitor O2; requiring 6L O2 Restrictions Weight Bearing Restrictions: No       Mobility Bed Mobility Overal bed mobility: Modified Independent                Transfers Overall transfer level: Needs assistance Equipment used: Rolling walker (2 wheeled) Transfers: Sit to/from Stand Sit to Stand: Min guard         General transfer comment: cues for hand placement        ADL either performed or assessed with clinical judgement   ADL Overall ADL's : Needs assistance/impaired Eating/Feeding: Sitting;Set up   Grooming: Sitting;Set up                                                 Cognition Arousal/Alertness: Awake/alert Behavior During Therapy: [facility] for tasks assessed/performed Overall Cognitive Status: Within Functional Limits for tasks assessed                                          Exercises Shoulder Exercises Shoulder Flexion: AROM;10 reps;Both;Strengthening;Seated Elbow Flexion: AROM;Both;20 reps;Seated Elbow Extension: AROM;20 reps;Both;Seated           Pertinent Vitals/ Pain       Pain Assessment: No/denies pain     Prior Functioning/Environment              Frequency  Min 2X/week        Progress Toward Goals  OT Goals(current  goals can now be found in the care plan section)  Progress towards OT goals: Progressing toward goals  Acute Rehab OT Goals Patient Stated Goal: Regain IND  Plan Discharge plan remains appropriate       AM-PAC OT "6 Clicks" Daily Activity     Outcome Measure   Help from another person eating meals?: A Little Help from another person taking care of personal grooming?: A Little Help from another person toileting, which includes using toliet, bedpan, or urinal?: A Little Help from another person bathing (including washing, rinsing, drying)?: A Little Help from another person to put on and taking off regular upper body clothing?: A Little Help from another person to put on and taking off regular lower body clothing?: A Little 6 Click Score: 18    End of Session    OT Visit Diagnosis: Unsteadiness on feet (R26.81);Muscle weakness (generalized) (M62.81)   Activity Tolerance Patient tolerated treatment well(and sats)   Patient Left with call bell/phone within reach;in chair   Nurse Communication Mobility status(sats)        Time: 2952-8413 OT Time Calculation (min): 15 min  Charges: OT General Charges $OT  Visit: 1 Visit OT Treatments $Therapeutic Activity: 8-22 mins  Lise Auer, [place] Acute Rehabilitation Services [phone] Office- [phone]      Veronia Laprise, Karin Golden D 03/06/2019, 5:35 PM

## 2019-03-06 NOTE — Progress Notes (Signed)
Physical Therapy Treatment Patient Details Name: Jeffrey [name] MRN: [id number] DOB: Apr 16, 1961 Today's Date: 03/06/2019    History of Present Illness 58 yo male admitted with COPD exacerbation 2* PNA and with hx of recent dc from hospital with Klebsiella PNA requiring intubation-VDRF 3/27-4/3.  Pt also with hx of [name], COPD, bipolar, CHF, and polysubstance abuse    PT Comments    Pt  Progressing well; incr amb distance, see below for info on VS; will continue to follow   Follow Up Recommendations  Home health PT(vs no f/u pending progress)     Equipment Recommendations  None recommended by PT    Recommendations for Other Services       Precautions / Restrictions Precautions Precautions: Fall Precaution Comments: monitor O2; requiring 6L O2 Restrictions Weight Bearing Restrictions: No    Mobility  Bed Mobility Overal bed mobility: Modified Independent                Transfers Overall transfer level: Needs assistance Equipment used: Rolling walker (2 wheeled) Transfers: Sit to/from Stand Sit to Stand: Min guard         General transfer comment: cues for hand placement  Ambulation/Gait Ambulation/Gait assistance: Supervision;Min guard Gait Distance (Feet): 180 Feet Assistive device: Rolling walker (2 wheeled) Gait Pattern/deviations: Step-through pattern;Decreased stride length Gait velocity: decr   General Gait Details: cues for trunk extension and shoulder depression to improve inspiration; SpO2 = 84% on 6L after amb, returned to 92% after seated rest x 1.5 min; RR in 25-32   Stairs             Wheelchair Mobility    Modified Rankin (Stroke Patients Only)       Balance                                            Cognition Arousal/Alertness: Awake/alert Behavior During Therapy: WFL for tasks assessed/performed Overall Cognitive Status: Within Functional Limits for tasks assessed                                        Exercises      General Comments        Pertinent Vitals/Pain Pain Assessment: No/denies pain    Home Living                      Prior Function            PT Goals (current goals can now be found in the care plan section) Acute Rehab PT Goals Patient Stated Goal: Regain IND PT Goal Formulation: With patient Time For Goal Achievement: 03/10/19 Potential to Achieve Goals: Good Progress towards PT goals: Progressing toward goals    Frequency    Min 3X/week      PT Plan Current plan remains appropriate    Co-evaluation              AM-PAC PT "6 Clicks" Mobility   Outcome Measure  Help needed turning from your back to your side while in a flat bed without using bedrails?: None Help needed moving from lying on your back to sitting on the side of a flat bed without using bedrails?: None Help needed moving to and from a bed to a chair (including a wheelchair)?: A  Little Help needed standing up from a chair using your arms (e.g., wheelchair or bedside chair)?: A Little Help needed to walk in hospital room?: A Little Help needed climbing 3-5 steps with a railing? : A Little 6 Click Score: 20    End of Session Equipment Utilized During Treatment: Gait belt;Oxygen Activity Tolerance: Patient tolerated treatment well Patient left: in chair;with call bell/phone within reach Nurse Communication: Mobility status PT Visit Diagnosis: Unsteadiness on feet (R26.81);Difficulty in walking, not elsewhere classified (R26.2)     Time: 6222-9798 PT Time Calculation (min) (ACUTE ONLY): 23 min  Charges:  $Gait Training: 23-37 mins                     Drucilla Chalet, PT  Pager: [phone] Acute Rehab Dept [facility]): [phone]   03/06/2019    [facility] 03/06/2019, 4:12 PM

## 2019-03-07 LAB — RENAL FUNCTION PANEL
Albumin: 2.4 g/dL — ABNORMAL LOW (ref 3.5–5.0)
Anion gap: 6 (ref 5–15)
BUN: 10 mg/dL (ref 6–20)
CO2: 33 mmol/L — ABNORMAL HIGH (ref 22–32)
Calcium: 8.5 mg/dL — ABNORMAL LOW (ref 8.9–10.3)
Chloride: 100 mmol/L (ref 98–111)
Creatinine, Ser: 0.47 mg/dL — ABNORMAL LOW (ref 0.61–1.24)
GFR calc Af Amer: >60 mL/min (ref >60)
GFR calc non Af Amer: >60 mL/min (ref >60)
Glucose, Bld: 145 mg/dL — ABNORMAL HIGH (ref 70–99)
Phosphorus: 2.5 mg/dL (ref 2.5–4.6)
Potassium: 3.7 mmol/L (ref 3.5–5.1)
Sodium: 139 mmol/L (ref 135–145)

## 2019-03-07 MED ORDER — DILTIAZEM HCL ER COATED BEADS 180 MG PO CP24: 180.0000 mg | ORAL_CAPSULE | Freq: Every day | ORAL | 0 refills | Status: DC

## 2019-03-07 MED ORDER — PREDNISONE 20 MG PO TABS
40.0000 mg | ORAL_TABLET | Freq: Every day | ORAL | Status: DC
  Administered 2019-03-07: 40 mg via ORAL
  Filled 2019-03-07: qty 2

## 2019-03-07 MED ORDER — METOPROLOL TARTRATE 25 MG PO TABS: 25.0000 mg | ORAL_TABLET | Freq: Two times a day (BID) | ORAL | 0 refills | Status: DC

## 2019-03-07 MED ORDER — LEVOFLOXACIN 750 MG PO TABS
750.0000 mg | ORAL_TABLET | Freq: Every day | ORAL | Status: DC
  Administered 2019-03-07: 750 mg via ORAL
  Filled 2019-03-07: qty 1

## 2019-03-07 MED ORDER — POTASSIUM CHLORIDE CRYS ER 20 MEQ PO TBCR: 80.0000 meq | EXTENDED_RELEASE_TABLET | Freq: Two times a day (BID) | ORAL | 0 refills | Status: DC

## 2019-03-07 MED ORDER — ALBUTEROL SULFATE HFA 108 (90 BASE) MCG/ACT IN AERS: 2.0000 | INHALATION_SPRAY | RESPIRATORY_TRACT | 3 refills | Status: DC | PRN

## 2019-03-07 MED ORDER — RESOURCE THICKENUP CLEAR PO POWD: 1.0000 | ORAL | 0 refills | Status: DC | PRN

## 2019-03-07 MED ORDER — CHLORHEXIDINE GLUCONATE CLOTH 2 % EX PADS
6.0000 | MEDICATED_PAD | Freq: Every day | CUTANEOUS | Status: DC
  Administered 2019-03-07: 6 via TOPICAL

## 2019-03-07 MED ORDER — PREDNISONE 20 MG PO TABS: 40.0000 mg | ORAL_TABLET | Freq: Every day | ORAL | 0 refills | Status: DC

## 2019-03-07 MED ORDER — POTASSIUM CHLORIDE CRYS ER 20 MEQ PO TBCR
80.0000 meq | EXTENDED_RELEASE_TABLET | Freq: Two times a day (BID) | ORAL | Status: DC
  Administered 2019-03-07: 80 meq via ORAL
  Filled 2019-03-07: qty 4

## 2019-03-07 MED ORDER — LEVOFLOXACIN 750 MG PO TABS: 750.0000 mg | ORAL_TABLET | Freq: Every day | ORAL | 0 refills | Status: DC

## 2019-03-07 NOTE — Progress Notes (Signed)
Occupational Therapy Treatment Patient Details Name: Jeffrey [name] MRN: [id number] DOB: 06-12-1961 Today's Date: 03/07/2019    History of present illness 58 yo male admitted with COPD exacerbation 2* PNA and with hx of recent dc from hospital with Klebsiella PNA requiring intubation-VDRF 3/27-4/3.  Pt also with hx of [name], COPD, bipolar, CHF, and polysubstance abuse   OT comments  Pt pleased to be working toward going home.  Follow Up Recommendations  Home health OT;Supervision/Assistance - 24 hour    Equipment Recommendations  None recommended by OT    Recommendations for Other Services      Precautions / Restrictions Precautions Precautions: Fall Precaution Comments: monitor O2 Restrictions Weight Bearing Restrictions: No       Mobility Bed Mobility               General bed mobility comments: pt OOB  Transfers Overall transfer level: Needs assistance Equipment used: Rolling walker (2 wheeled) Transfers: Sit to/from Stand Sit to Stand: Min guard              Balance Overall balance assessment: Needs assistance         Standing balance support: Bilateral upper extremity supported Standing balance-Leahy Scale: Fair                             ADL either performed or assessed with clinical judgement   ADL Overall ADL's : Needs assistance/impaired             Lower Body Bathing: Minimal assistance;Sit to/from stand;Cueing for sequencing;Cueing for safety       Lower Body Dressing: Sit to/from stand;Minimal assistance   Toilet Transfer: Minimal assistance;RW;BSC;Stand-pivot   Toileting- Clothing Manipulation and Hygiene: Minimal assistance;Sit to/from stand;Cueing for sequencing;Cueing for safety         General ADL Comments: Pt OOB.  Issued orange theraband and issued in use.  Pt plans to Dc home this day with family to A as needed. Reinterated energy conservation strategies with pt in regards to ADL activity.        Vision Patient Visual Report: No change from baseline            Cognition Arousal/Alertness: Awake/alert Behavior During Therapy: WFL for tasks assessed/performed Overall Cognitive Status: Within Functional Limits for tasks assessed                                          Exercises Shoulder Exercises Shoulder Flexion: AROM;Both;Strengthening;Seated;5 reps;Theraband Theraband Level (Shoulder Flexion): Other (comment)(orange)           Pertinent Vitals/ Pain       Pain Assessment: No/denies pain         Frequency  Min 2X/week        Progress Toward Goals  OT Goals(current goals can now be found in the care plan section)  Progress towards OT goals: Progressing toward goals     Plan Discharge plan remains appropriate       AM-PAC OT "6 Clicks" Daily Activity     Outcome Measure   Help from another person eating meals?: None Help from another person taking care of personal grooming?: None Help from another person toileting, which includes using toliet, bedpan, or urinal?: A Little Help from another person bathing (including washing, rinsing, drying)?: A Little Help from another person to put on and  taking off regular upper body clothing?: A Little Help from another person to put on and taking off regular lower body clothing?: A Little 6 Click Score: 20    End of Session    OT Visit Diagnosis: Unsteadiness on feet (R26.81);Muscle weakness (generalized) (M62.81)   Activity Tolerance Patient tolerated treatment well(and sats)   Patient Left with call bell/phone within reach;in chair   Nurse Communication Mobility status(sats)        Time: 1610-9604 OT Time Calculation (min): 17 min  Charges: OT General Charges $OT Visit: 1 Visit OT Treatments $Self Care/Home Management : 8-22 mins  Lise Auer, OT Acute Rehabilitation Services [phone] Office- [phone]      Abelino Tippin, Karin Golden D 03/07/2019, 2:13  PM

## 2019-03-07 NOTE — Discharge Summary (Signed)
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 see all data.   Prognosis 02/27/2019 Prognosis for Safe Diet Advancement Good Barriers to Reach Goals -- Barriers/Prognosis Comment -- Some encounter information is confidential and restricted. Go to Review Flowsheets activity to see all data. CHL IP DIET RECOMMENDATION 02/27/2019 SLP Diet Recommendations Dysphagia 3 (Mech soft) solids;Thin liquid Liquid Administration via Cup;Straw Medication Administration Whole meds with puree Compensations Slow rate;Small sips/bites Postural Changes Remain semi-upright after after feeds/meals (Comment);Seated upright at 90 degrees Some encounter information is confidential and restricted. Go to Review Flowsheets activity to see all data.   CHL IP OTHER RECOMMENDATIONS 02/27/2019 Recommended Consults -- Oral Care Recommendations Oral care BID Other Recommendations -- Some encounter information is confidential and restricted. Go to Review Flowsheets activity to see all data.   CHL IP FOLLOW UP RECOMMENDATIONS 02/24/2019 Follow up Recommendations Other (comment) Some encounter information is confidential and  restricted. Go to Review Flowsheets activity to see all data.   CHL IP FREQUENCY AND DURATION 02/27/2019 Speech Therapy Frequency (ACUTE ONLY) min 1 x/week Treatment Duration 1 week Some encounter information is confidential and restricted. Go to Review Flowsheets activity to see all data.      CHL IP ORAL PHASE 02/27/2019 Oral Phase Impaired Oral - Pudding Teaspoon -- Oral - Pudding Cup -- Oral - Honey Teaspoon -- Oral - Honey Cup -- Oral - Nectar Teaspoon -- Oral - Nectar Cup WFL Oral - Nectar Straw -- Oral - Thin  Teaspoon -- Oral - Thin Cup WFL Oral - Thin Straw WFL Oral - Puree WFL Oral - Mech Soft WFL Oral - Regular -- Oral - Multi-Consistency -- Oral - Pill WFL Oral Phase - Comment -- Some encounter information is confidential and restricted. Go to Review Flowsheets activity to see all data.  CHL IP PHARYNGEAL PHASE 02/27/2019 Pharyngeal Phase Impaired Pharyngeal- Pudding Teaspoon -- Pharyngeal -- Pharyngeal- Pudding Cup -- Pharyngeal -- Pharyngeal- Honey Teaspoon -- Pharyngeal -- Pharyngeal- Honey Cup -- Pharyngeal -- Pharyngeal- Nectar Teaspoon -- Pharyngeal -- Pharyngeal- Nectar Cup WFL Pharyngeal -- Pharyngeal- Nectar Straw -- Pharyngeal -- Pharyngeal- Thin Teaspoon WFL Pharyngeal -- Pharyngeal- Thin Cup WFL Pharyngeal -- Pharyngeal- Thin Straw WFL Pharyngeal -- Pharyngeal- Puree WFL;Reduced tongue base retraction;Pharyngeal residue - valleculae Pharyngeal -- Pharyngeal- Mechanical Soft Reduced tongue base retraction;Pharyngeal residue - valleculae Pharyngeal -- Pharyngeal- Regular -- Pharyngeal -- Pharyngeal- Multi-consistency -- Pharyngeal -- Pharyngeal- Pill Reduced tongue base retraction;Pharyngeal residue - valleculae Pharyngeal -- Pharyngeal Comment -- Some encounter information is confidential and restricted. Go to Review Flowsheets activity to see all data.  CHL IP CERVICAL ESOPHAGEAL PHASE 02/27/2019 Cervical Esophageal Phase WFL Pudding Teaspoon -- Pudding Cup -- Honey Teaspoon -- Honey Cup -- Nectar  Teaspoon -- Nectar Cup -- Nectar Straw -- Thin Teaspoon -- Thin Cup -- Thin Straw -- Puree -- Mechanical Soft -- Regular -- Multi-consistency -- Pill -- Cervical Esophageal Comment -- Some encounter information is confidential and restricted. Go to Review Flowsheets activity to see all data. Slow cleared mid-esophagus without pt awareness, radiologist not present to confirm.  Pt without sensation to minimal amount of residual in esophagus. Macario Golds 02/27/2019, 2:45 PM  Luanna Salk, MS [facility] SLP Acute Rehab Services Pager [phone] Office [phone]              Microbiology: Recent Results (from the past 240 hour(s))  Culture, blood (routine x 2)     Status: None   Collection Time: 02/26/19 10:15 AM  Result Value Ref Range Status   Specimen Description   Final    BLOOD LEFT ARM Performed at [facility] [address], [place], [place] [address]    Special Requests   Final    BOTTLES DRAWN AEROBIC ONLY Blood Culture adequate volume Performed at [facility] [address], [place], [place] [address]    Culture   Final    NO GROWTH 5 DAYS Performed at [facility], [place] [address] [place], [place] [address]    Report Status 03/03/2019 FINAL  Final  Culture, blood (routine x 2)     Status: None   Collection Time: 02/26/19 10:33 AM  Result Value Ref Range Status   Specimen Description   Final    BLOOD LEFT HAND Performed at [facility] [address], [place], [place] [address]    Special Requests   Final    BOTTLES DRAWN AEROBIC ONLY Blood Culture adequate volume Performed at [facility] [address], [place], [place] [address]    Culture   Final    NO GROWTH 5 DAYS Performed at [facility], [place] [address] [place],  [address]    Report Status 03/03/2019 FINAL  Final  Expectorated sputum assessment w rflx to resp cult     Status: None   Collection Time: 03/06/19   7:00 AM  Result Value Ref Range Status   Specimen Description SPUTUM  Final   Special Requests  NONE  Final   Sputum evaluation   Final    THIS SPECIMEN IS ACCEPTABLE FOR SPUTUM CULTURE Performed at [facility] [address], [place], [place] [address]    Report Status 03/06/2019 FINAL  Final  Culture, respiratory     Status: None (Preliminary result)   Collection Time: 03/06/19  7:00 AM  Result Value Ref Range Status   Specimen Description   Final    SPUTUM Performed at [facility] [address] [place], [place] [address]    Special Requests   Final    NONE Performed at [facility], [place] [address], [place], [place] [address]    Gram Stain NO WBC SEEN NO ORGANISMS SEEN   Final   Culture   Final    CULTURE REINCUBATED FOR BETTER GROWTH Performed at [facility], [place] [address] [place], [place] [address]    Report Status PENDING  Incomplete     Labs: Basic Metabolic Panel: Recent Labs  Lab 03/03/19 0453 03/04/19 0258 03/05/19 0500 03/06/19 0328 03/07/19 0501  NA 140 140 144 143 139  K 4.2 4.5 3.5 3.1* 3.7  CL 103 104 101 98 100  CO2 27 24 34* 37* 33*  GLUCOSE 183* 149* 120* 87 145*  BUN 12 19 23* 23* 10  CREATININE 0.41* 0.52* 0.41* 0.55* 0.47*  CALCIUM 8.7* 8.6* 8.6* 8.5* 8.5*  MG 1.8  --  2.2 2.1  --   PHOS 3.3  --  4.5 3.8 2.5   Liver Function Tests: Recent Labs  Lab 03/01/19 0344 03/03/19 0453 03/07/19 0501  AST 22  --   --   ALT 22  --   --   ALKPHOS 273*  --   --   BILITOT 0.6  --   --   PROT 5.6*  --   --   ALBUMIN 2.4* 2.5* 2.4*   No results for input(s): LIPASE, AMYLASE in the last 168 hours. No results for input(s): AMMONIA in the last 168 hours. CBC: Recent Labs  Lab 03/01/19 0344 03/02/19 0518 03/04/19 0258 03/05/19 0500  WBC 6.8 5.9 5.6 5.9  NEUTROABS 5.1 5.5 4.7  --   HGB 9.7* 10.7* 9.2* 10.5*  HCT 32.3* 35.6* 30.9* 35.1*  MCV 89.5 88.6 88.8 88.0  PLT 384 394  308 305   Cardiac Enzymes: No results for input(s): CKTOTAL, CKMB, CKMBINDEX, TROPONINI in the last 168 hours. BNP: BNP (last 3 results) Recent Labs    06/16/18 0151 09/16/18 1049  BNP 315.0* 407.0*    ProBNP (last 3 results) No results for input(s): PROBNP in the last 8760 hours.  CBG: Recent Labs  Lab 03/02/19 0709 03/02/19 1125 03/02/19 1716 03/02/19 2038 03/04/19 0756  GLUCAP 165* 151* 147* 163* 141*       Signed:  Nita Sells MD   [facility] 03/07/2019, 10:43 AM

## 2019-03-07 NOTE — TOC Transition Note (Signed)
Transition of Care [facility]) - CM/SW Discharge Note   Patient Details  Name: Jeffrey [name] MRN: [id number] Date of Birth: 1961-07-01  [facility] CM/SW Contact:  Bartholome Bill, RN Phone Number: 03/07/2019, 11:34 AM   Clinical Narrative:    Pt to dc home with [facility] for home health services. Also to dc with RW and home 02. [employer] contacted for both DME needs. O2 and RW to be delivered to pt room.   Final next level of care: Home w Home Health Services Barriers to Discharge: Continued Medical Work up   Patient Goals and CMS Choice Patient states their goals for this hospitalization and ongoing recovery are:: to go back home  CMS Medicare.gov Compare Post Acute Care list provided to:: Patient Choice offered to / list presented to : Patient  Discharge Plan and Services In-house Referral: Clinical Social Work   Post Acute Care Choice: Home Health                       Readmission Risk Interventions Readmission Risk Prevention Plan 02/20/2019  Transportation Screening Complete  Medication Review [employer]) Not Complete  PCP or Specialist appointment within 3-5 days of discharge Complete  HRI or Home Care Consult (No Data)  SW Recovery Care/Counseling Consult (No Data)  Palliative Care Screening Not Applicable  Skilled Nursing Facility Not Applicable  Some recent data might be hidden

## 2019-03-07 NOTE — Progress Notes (Signed)
SATURATION QUALIFICATIONS: (This note is used to comply with regulatory documentation for home oxygen)  Patient Saturations on Room Air at Rest = 88%  Patient Saturations on Room Air while Ambulating =   Patient Saturations on  Liters of oxygen while Ambulating =   Please briefly explain why patient needs home oxygen: He desaturated without oxygen

## 2019-03-08 LAB — CULTURE, RESPIRATORY W GRAM STAIN
Culture: NORMAL
Gram Stain: NONE SEEN

## 2019-03-12 ENCOUNTER — Other Ambulatory Visit (HOSPITAL_COMMUNITY)
Admission: RE | Admit: 2019-03-12 | Discharge: 2019-03-12 | Disposition: A | Discharge status: Home / Self Care | Payer: Medicare Other | Source: Other Acute Inpatient Hospital | Attending: Family Medicine | Admitting: Family Medicine

## 2019-03-12 DIAGNOSIS — Z5181 Encounter for therapeutic drug level monitoring: Secondary | ICD-10-CM | POA: Diagnosis present

## 2019-03-12 LAB — POTASSIUM: Potassium: 4.1 mmol/L (ref 3.5–5.1)

## 2019-03-24 ENCOUNTER — Other Ambulatory Visit: Payer: Self-pay

## 2019-03-24 ENCOUNTER — Inpatient Hospital Stay (HOSPITAL_COMMUNITY)
Admission: EM | Admit: 2019-03-24 | Discharge: 2019-03-30 | DRG: 191 | Disposition: A | Discharge status: Home Health | Payer: Medicare Other | Attending: Internal Medicine | Admitting: Internal Medicine

## 2019-03-24 ENCOUNTER — Emergency Department (HOSPITAL_COMMUNITY): Payer: Medicare Other

## 2019-03-24 ENCOUNTER — Other Ambulatory Visit: Payer: Self-pay | Admitting: Adult Health

## 2019-03-24 ENCOUNTER — Encounter (HOSPITAL_COMMUNITY): Payer: Self-pay | Admitting: *Deleted

## 2019-03-24 DIAGNOSIS — F314 Bipolar disorder, current episode depressed, severe, without psychotic features: Secondary | ICD-10-CM | POA: Diagnosis present

## 2019-03-24 DIAGNOSIS — J479 Bronchiectasis, uncomplicated: Secondary | ICD-10-CM | POA: Diagnosis not present

## 2019-03-24 DIAGNOSIS — R509 Fever, unspecified: Secondary | ICD-10-CM | POA: Diagnosis present

## 2019-03-24 DIAGNOSIS — E785 Hyperlipidemia, unspecified: Secondary | ICD-10-CM | POA: Diagnosis present

## 2019-03-24 DIAGNOSIS — G4700 Insomnia, unspecified: Secondary | ICD-10-CM | POA: Diagnosis present

## 2019-03-24 DIAGNOSIS — Z87891 Personal history of nicotine dependence: Secondary | ICD-10-CM

## 2019-03-24 DIAGNOSIS — Z8249 Family history of ischemic heart disease and other diseases of the circulatory system: Secondary | ICD-10-CM

## 2019-03-24 DIAGNOSIS — J449 Chronic obstructive pulmonary disease, unspecified: Secondary | ICD-10-CM | POA: Diagnosis not present

## 2019-03-24 DIAGNOSIS — F1020 Alcohol dependence, uncomplicated: Secondary | ICD-10-CM | POA: Diagnosis present

## 2019-03-24 DIAGNOSIS — Y95 Nosocomial condition: Secondary | ICD-10-CM

## 2019-03-24 DIAGNOSIS — Z7982 Long term (current) use of aspirin: Secondary | ICD-10-CM

## 2019-03-24 DIAGNOSIS — Z7141 Alcohol abuse counseling and surveillance of alcoholic: Secondary | ICD-10-CM

## 2019-03-24 DIAGNOSIS — Z818 Family history of other mental and behavioral disorders: Secondary | ICD-10-CM

## 2019-03-24 DIAGNOSIS — Z79899 Other long term (current) drug therapy: Secondary | ICD-10-CM

## 2019-03-24 DIAGNOSIS — F419 Anxiety disorder, unspecified: Secondary | ICD-10-CM | POA: Diagnosis present

## 2019-03-24 DIAGNOSIS — Z9981 Dependence on supplemental oxygen: Secondary | ICD-10-CM

## 2019-03-24 DIAGNOSIS — Z8711 Personal history of peptic ulcer disease: Secondary | ICD-10-CM

## 2019-03-24 DIAGNOSIS — I5032 Chronic diastolic (congestive) heart failure: Secondary | ICD-10-CM | POA: Diagnosis present

## 2019-03-24 DIAGNOSIS — Z9889 Other specified postprocedural states: Secondary | ICD-10-CM

## 2019-03-24 DIAGNOSIS — Z79891 Long term (current) use of opiate analgesic: Secondary | ICD-10-CM

## 2019-03-24 DIAGNOSIS — Z881 Allergy status to other antibiotic agents status: Secondary | ICD-10-CM

## 2019-03-24 DIAGNOSIS — Z20828 Contact with and (suspected) exposure to other viral communicable diseases: Secondary | ICD-10-CM | POA: Diagnosis present

## 2019-03-24 DIAGNOSIS — J471 Bronchiectasis with (acute) exacerbation: Secondary | ICD-10-CM | POA: Diagnosis not present

## 2019-03-24 DIAGNOSIS — J310 Chronic rhinitis: Secondary | ICD-10-CM | POA: Diagnosis present

## 2019-03-24 DIAGNOSIS — I110 Hypertensive heart disease with heart failure: Secondary | ICD-10-CM | POA: Diagnosis present

## 2019-03-24 DIAGNOSIS — K219 Gastro-esophageal reflux disease without esophagitis: Secondary | ICD-10-CM | POA: Diagnosis present

## 2019-03-24 DIAGNOSIS — J189 Pneumonia, unspecified organism: Secondary | ICD-10-CM

## 2019-03-24 DIAGNOSIS — Z7952 Long term (current) use of systemic steroids: Secondary | ICD-10-CM

## 2019-03-24 DIAGNOSIS — Z888 Allergy status to other drugs, medicaments and biological substances status: Secondary | ICD-10-CM

## 2019-03-24 DIAGNOSIS — F319 Bipolar disorder, unspecified: Secondary | ICD-10-CM | POA: Diagnosis present

## 2019-03-24 DIAGNOSIS — Z89412 Acquired absence of left great toe: Secondary | ICD-10-CM

## 2019-03-24 DIAGNOSIS — I739 Peripheral vascular disease, unspecified: Secondary | ICD-10-CM | POA: Diagnosis present

## 2019-03-24 DIAGNOSIS — I10 Essential (primary) hypertension: Secondary | ICD-10-CM | POA: Diagnosis present

## 2019-03-24 DIAGNOSIS — J9611 Chronic respiratory failure with hypoxia: Secondary | ICD-10-CM | POA: Diagnosis present

## 2019-03-24 DIAGNOSIS — F1220 Cannabis dependence, uncomplicated: Secondary | ICD-10-CM | POA: Diagnosis present

## 2019-03-24 DIAGNOSIS — J90 Pleural effusion, not elsewhere classified: Secondary | ICD-10-CM

## 2019-03-24 HISTORY — DX: Heart failure, unspecified: I50.9

## 2019-03-24 LAB — COMPREHENSIVE METABOLIC PANEL
ALT: 78 U/L — ABNORMAL HIGH (ref 0–44)
AST: 45 U/L — ABNORMAL HIGH (ref 15–41)
Albumin: 2.4 g/dL — ABNORMAL LOW (ref 3.5–5.0)
Alkaline Phosphatase: 431 U/L — ABNORMAL HIGH (ref 38–126)
Anion gap: 10 (ref 5–15)
BUN: 5 mg/dL — ABNORMAL LOW (ref 6–20)
CO2: 27 mmol/L (ref 22–32)
Calcium: 8.6 mg/dL — ABNORMAL LOW (ref 8.9–10.3)
Chloride: 97 mmol/L — ABNORMAL LOW (ref 98–111)
Creatinine, Ser: 0.49 mg/dL — ABNORMAL LOW (ref 0.61–1.24)
GFR calc Af Amer: >60 mL/min (ref >60)
GFR calc non Af Amer: >60 mL/min (ref >60)
Glucose, Bld: 87 mg/dL (ref 70–99)
Potassium: 4.3 mmol/L (ref 3.5–5.1)
Sodium: 134 mmol/L — ABNORMAL LOW (ref 135–145)
Total Bilirubin: 0.3 mg/dL (ref 0.3–1.2)
Total Protein: 6.1 g/dL — ABNORMAL LOW (ref 6.5–8.1)

## 2019-03-24 LAB — CBC WITH DIFFERENTIAL/PLATELET
Abs Immature Granulocytes: 0.09 10*3/uL — ABNORMAL HIGH (ref 0.00–0.07)
Basophils Absolute: 0 10*3/uL (ref 0.0–0.1)
Basophils Relative: 1 %
Eosinophils Absolute: 0.2 10*3/uL (ref 0.0–0.5)
Eosinophils Relative: 3 %
HCT: 31.1 % — ABNORMAL LOW (ref 39.0–52.0)
Hemoglobin: 9.6 g/dL — ABNORMAL LOW (ref 13.0–17.0)
Immature Granulocytes: 1 %
Lymphocytes Relative: 12 %
Lymphs Abs: 0.9 10*3/uL (ref 0.7–4.0)
MCH: 26.2 pg (ref 26.0–34.0)
MCHC: 30.9 g/dL (ref 30.0–36.0)
MCV: 85 fL (ref 80.0–100.0)
Monocytes Absolute: 0.8 10*3/uL (ref 0.1–1.0)
Monocytes Relative: 11 %
Neutro Abs: 5.4 10*3/uL (ref 1.7–7.7)
Neutrophils Relative %: 72 %
Platelets: 289 10*3/uL (ref 150–400)
RBC: 3.66 MIL/uL — ABNORMAL LOW (ref 4.22–5.81)
RDW: 13.9 % (ref 11.5–15.5)
WBC: 7.5 10*3/uL (ref 4.0–10.5)
nRBC: 0 % (ref 0.0–0.2)

## 2019-03-24 LAB — URINALYSIS, ROUTINE W REFLEX MICROSCOPIC
Bilirubin Urine: NEGATIVE
Glucose, UA: NEGATIVE mg/dL
Hgb urine dipstick: NEGATIVE
Ketones, ur: NEGATIVE mg/dL
Leukocytes,Ua: NEGATIVE
Nitrite: NEGATIVE
Protein, ur: NEGATIVE mg/dL
Specific Gravity, Urine: 1.008 (ref 1.005–1.030)
pH: 7 (ref 5.0–8.0)

## 2019-03-24 LAB — LACTIC ACID, PLASMA
Lactic Acid, Venous: 1 mmol/L (ref 0.5–1.9)
Lactic Acid, Venous: 1.3 mmol/L (ref 0.5–1.9)

## 2019-03-24 LAB — C-REACTIVE PROTEIN: CRP: 13 mg/dL — ABNORMAL HIGH (ref <1.0)

## 2019-03-24 LAB — MRSA PCR SCREENING: MRSA by PCR: NEGATIVE

## 2019-03-24 LAB — TROPONIN I: Troponin I: <0.03 ng/mL (ref <0.03)

## 2019-03-24 LAB — PROCALCITONIN: Procalcitonin: <0.1 ng/mL

## 2019-03-24 LAB — SARS CORONAVIRUS 2 BY RT PCR (HOSPITAL ORDER, PERFORMED IN ~~LOC~~ HOSPITAL LAB): SARS Coronavirus 2: NEGATIVE

## 2019-03-24 MED ORDER — VANCOMYCIN HCL IN DEXTROSE 750-5 MG/150ML-% IV SOLN: 750.0000 mg | Freq: Three times a day (TID) | INTRAVENOUS | Status: DC

## 2019-03-24 MED ORDER — VANCOMYCIN HCL 10 G IV SOLR
1250.0000 mg | Freq: Once | INTRAVENOUS | Status: AC
  Administered 2019-03-24: 1250 mg via INTRAVENOUS
  Filled 2019-03-24: qty 1250

## 2019-03-24 MED ORDER — OLANZAPINE 5 MG PO TABS
10.0000 mg | ORAL_TABLET | Freq: Every day | ORAL | Status: DC
  Administered 2019-03-24 – 2019-03-29 (×6): 10 mg via ORAL
  Filled 2019-03-24 (×6): qty 2

## 2019-03-24 MED ORDER — POTASSIUM CHLORIDE CRYS ER 20 MEQ PO TBCR
80.0000 meq | EXTENDED_RELEASE_TABLET | Freq: Two times a day (BID) | ORAL | Status: DC
  Administered 2019-03-24: 80 meq via ORAL
  Filled 2019-03-24 (×2): qty 4

## 2019-03-24 MED ORDER — TIOTROPIUM BROMIDE MONOHYDRATE 18 MCG IN CAPS: 18.0000 ug | ORAL_CAPSULE | Freq: Every day | RESPIRATORY_TRACT | Status: DC

## 2019-03-24 MED ORDER — ATORVASTATIN CALCIUM 20 MG PO TABS
20.0000 mg | ORAL_TABLET | Freq: Every day | ORAL | Status: DC
  Administered 2019-03-25 – 2019-03-29 (×5): 20 mg via ORAL
  Filled 2019-03-24 (×5): qty 1

## 2019-03-24 MED ORDER — GABAPENTIN 400 MG PO CAPS
800.0000 mg | ORAL_CAPSULE | Freq: Every day | ORAL | Status: DC
  Administered 2019-03-24 – 2019-03-29 (×6): 800 mg via ORAL
  Filled 2019-03-24 (×6): qty 2

## 2019-03-24 MED ORDER — ONDANSETRON HCL 4 MG PO TABS: 4.0000 mg | ORAL_TABLET | Freq: Four times a day (QID) | ORAL | Status: DC | PRN

## 2019-03-24 MED ORDER — GUAIFENESIN ER 600 MG PO TB12
600.0000 mg | ORAL_TABLET | Freq: Two times a day (BID) | ORAL | Status: DC
  Administered 2019-03-24 – 2019-03-30 (×12): 600 mg via ORAL
  Filled 2019-03-24 (×11): qty 1

## 2019-03-24 MED ORDER — ONDANSETRON HCL 4 MG/2ML IJ SOLN: 4.0000 mg | Freq: Four times a day (QID) | INTRAMUSCULAR | Status: DC | PRN

## 2019-03-24 MED ORDER — METHOCARBAMOL 500 MG PO TABS
500.0000 mg | ORAL_TABLET | Freq: Three times a day (TID) | ORAL | Status: DC
  Administered 2019-03-24 – 2019-03-30 (×17): 500 mg via ORAL
  Filled 2019-03-24 (×17): qty 1

## 2019-03-24 MED ORDER — ADULT MULTIVITAMIN W/MINERALS CH
1.0000 | ORAL_TABLET | Freq: Every day | ORAL | Status: DC
  Filled 2019-03-24: qty 1

## 2019-03-24 MED ORDER — TRAZODONE HCL 50 MG PO TABS
50.0000 mg | ORAL_TABLET | Freq: Every day | ORAL | Status: DC
  Administered 2019-03-24 – 2019-03-29 (×6): 50 mg via ORAL
  Filled 2019-03-24 (×6): qty 1

## 2019-03-24 MED ORDER — ENOXAPARIN SODIUM 40 MG/0.4ML ~~LOC~~ SOLN
40.0000 mg | SUBCUTANEOUS | Status: DC
  Administered 2019-03-24 – 2019-03-29 (×6): 40 mg via SUBCUTANEOUS
  Filled 2019-03-24 (×6): qty 0.4

## 2019-03-24 MED ORDER — LORAZEPAM 2 MG/ML IJ SOLN: 1.0000 mg | Freq: Four times a day (QID) | INTRAMUSCULAR | Status: AC | PRN

## 2019-03-24 MED ORDER — DIVALPROEX SODIUM ER 500 MG PO TB24
1500.0000 mg | ORAL_TABLET | Freq: Every day | ORAL | Status: DC
  Administered 2019-03-24 – 2019-03-29 (×6): 1500 mg via ORAL
  Filled 2019-03-24 (×6): qty 3

## 2019-03-24 MED ORDER — ALBUTEROL SULFATE HFA 108 (90 BASE) MCG/ACT IN AERS
2.0000 | INHALATION_SPRAY | RESPIRATORY_TRACT | Status: DC
  Administered 2019-03-24 – 2019-03-25 (×4): 2 via RESPIRATORY_TRACT
  Filled 2019-03-24 (×2): qty 6.7

## 2019-03-24 MED ORDER — ASPIRIN EC 81 MG PO TBEC
81.0000 mg | DELAYED_RELEASE_TABLET | Freq: Every evening | ORAL | Status: DC
  Administered 2019-03-25 – 2019-03-29 (×5): 81 mg via ORAL
  Filled 2019-03-24 (×5): qty 1

## 2019-03-24 MED ORDER — UMECLIDINIUM BROMIDE 62.5 MCG/INH IN AEPB
1.0000 | INHALATION_SPRAY | Freq: Every day | RESPIRATORY_TRACT | Status: DC
  Administered 2019-03-25 – 2019-03-30 (×5): 1 via RESPIRATORY_TRACT
  Filled 2019-03-24 (×2): qty 7

## 2019-03-24 MED ORDER — IBUPROFEN 400 MG PO TABS
400.0000 mg | ORAL_TABLET | Freq: Once | ORAL | Status: AC
  Administered 2019-03-24: 400 mg via ORAL
  Filled 2019-03-24: qty 1

## 2019-03-24 MED ORDER — LORAZEPAM 1 MG PO TABS
1.0000 mg | ORAL_TABLET | Freq: Four times a day (QID) | ORAL | Status: AC | PRN
  Administered 2019-03-26 – 2019-03-27 (×4): 1 mg via ORAL
  Filled 2019-03-24 (×3): qty 1

## 2019-03-24 MED ORDER — SODIUM CHLORIDE 0.9 % IV SOLN
1000.0000 mL | INTRAVENOUS | Status: DC
  Administered 2019-03-24: 1000 mL via INTRAVENOUS

## 2019-03-24 MED ORDER — GABAPENTIN 400 MG PO CAPS
400.0000 mg | ORAL_CAPSULE | Freq: Every day | ORAL | Status: DC
  Administered 2019-03-25 – 2019-03-30 (×6): 400 mg via ORAL
  Filled 2019-03-24 (×6): qty 1

## 2019-03-24 MED ORDER — FOLIC ACID 1 MG PO TABS
1.0000 mg | ORAL_TABLET | Freq: Every day | ORAL | Status: DC
  Administered 2019-03-25 – 2019-03-30 (×6): 1 mg via ORAL
  Filled 2019-03-24 (×6): qty 1

## 2019-03-24 MED ORDER — FLUOXETINE HCL 20 MG PO CAPS
40.0000 mg | ORAL_CAPSULE | Freq: Every day | ORAL | Status: DC
  Administered 2019-03-25 – 2019-03-30 (×6): 40 mg via ORAL
  Filled 2019-03-24 (×6): qty 2

## 2019-03-24 MED ORDER — SODIUM CHLORIDE 0.9 % IV BOLUS (SEPSIS)
1000.0000 mL | Freq: Once | INTRAVENOUS | Status: AC
  Administered 2019-03-24: 1000 mL via INTRAVENOUS

## 2019-03-24 MED ORDER — DILTIAZEM HCL ER COATED BEADS 180 MG PO CP24
180.0000 mg | ORAL_CAPSULE | Freq: Every day | ORAL | Status: DC
  Administered 2019-03-25 – 2019-03-30 (×6): 180 mg via ORAL
  Filled 2019-03-24 (×6): qty 1

## 2019-03-24 MED ORDER — DOXYCYCLINE HYCLATE 100 MG PO TABS
100.0000 mg | ORAL_TABLET | Freq: Two times a day (BID) | ORAL | Status: DC
  Administered 2019-03-24 – 2019-03-26 (×4): 100 mg via ORAL
  Filled 2019-03-24 (×4): qty 1

## 2019-03-24 MED ORDER — SODIUM CHLORIDE 0.9 % IV SOLN
2.0000 g | Freq: Once | INTRAVENOUS | Status: AC
  Administered 2019-03-24: 2 g via INTRAVENOUS
  Filled 2019-03-24: qty 2

## 2019-03-24 MED ORDER — METOPROLOL TARTRATE 25 MG PO TABS
25.0000 mg | ORAL_TABLET | Freq: Two times a day (BID) | ORAL | Status: DC
  Administered 2019-03-24 – 2019-03-30 (×12): 25 mg via ORAL
  Filled 2019-03-24 (×12): qty 1

## 2019-03-24 MED ORDER — VITAMIN B-1 100 MG PO TABS
250.0000 mg | ORAL_TABLET | Freq: Every day | ORAL | Status: DC
  Administered 2019-03-25 – 2019-03-30 (×6): 250 mg via ORAL
  Filled 2019-03-24 (×6): qty 3

## 2019-03-24 MED ORDER — GABAPENTIN 400 MG PO CAPS: 400.0000 mg | ORAL_CAPSULE | ORAL | Status: DC

## 2019-03-24 NOTE — Progress Notes (Signed)
Pharmacy Antibiotic Note  Jeffrey [name] is a 58 y.o. male admitted on 03/24/2019 with pneumonia.  Pharmacy has been consulted for vancomycin dosing.  Plan: Vancomycin 750mg  IV every 8 hours.  Goal trough 15-20 mcg/mL.    Height: 5\' 10"  (177.8 cm) Weight: 139 lb (63 kg) IBW/kg (Calculated) : 73  Temp (24hrs), Avg:98.5 F (36.9 C), Min:98.5 F (36.9 C), Max:98.5 F (36.9 C)  Recent Labs  Lab 03/24/19 1232  CREATININE 0.49*  LATICACIDVEN 1.0    Estimated Creatinine Clearance: 89.8 mL/min (A) (by C-G formula based on SCr of 0.49 mg/dL (L)).    Allergies  Allergen Reactions  . Augmentin [Amoxicillin-Pot Clavulanate] Rash and Other (See Comments)    Has patient had a PCN reaction causing immediate rash, facial/tongue/throat swelling, SOB or lightheadedness with hypotension: No Has patient had a PCN reaction causing severe rash involving mucus membranes or skin necrosis: No Has patient had a PCN reaction that required hospitalization No Has patient had a PCN reaction occurring within the last 10 years: Yes If all of the above answers are "NO", then may proceed with Cephalosporin use.  . Ace Inhibitors Hives    Antimicrobials this admission: 5/1 vancomycin >>  5/1 aztreonam x 1   Microbiology results: 5/1 BCx: sent 5/1 MRSA PCR: sent   Thank you for allowing pharmacy to be a part of this patient's care.  Jeffrey [name] 03/24/2019 1:52 PM

## 2019-03-24 NOTE — ED Provider Notes (Signed)
Pt's care assumed at 5pm.  Pt's covid test is negative.  I called Dr. Adrian Blackwater to notify.    Elson Areas, PA-C 03/24/19 1757    Donnetta Hutching, MD 03/24/19 Kristopher Oppenheim

## 2019-03-24 NOTE — ED Notes (Addendum)
ED TO INPATIENT HANDOFF REPORT  ED Nurse Name and Phone #: Josph Macho RN [phone]  S Name/Age/Gender Jeffrey [name] 58 y.o. male Room/Bed: APA14/APA14  Code Status   Code Status: Prior  Home/SNF/Other Home Patient oriented to: self Is this baseline? Yes   Triage Complete: Triage complete  Chief Complaint Abnormal Labs  Triage Note Patient had home health to visit today advised patient to come into ER due to abnormal labs drawn a couple of days ago.  Patient currently afebrile with oral temp 98.5, patient had taken Tylenol 2 hours prior.     Allergies Allergies  Allergen Reactions  . Augmentin [Amoxicillin-Pot Clavulanate] Rash and Other (See Comments)    Has patient had a PCN reaction causing immediate rash, facial/tongue/throat swelling, SOB or lightheadedness with hypotension: No Has patient had a PCN reaction causing severe rash involving mucus membranes or skin necrosis: No Has patient had a PCN reaction that required hospitalization No Has patient had a PCN reaction occurring within the last 10 years: Yes If all of the above answers are "NO", then may proceed with Cephalosporin use.  Donivan Scull Inhibitors Hives    Level of Care/Admitting Diagnosis ED Disposition    ED Disposition Condition Comment   Admit  Hospital Area: [facility] [[id number]]  Level of Care: Telemetry [5]  Covid Evaluation: N/A  Diagnosis: Fever [147829]  Admitting Physician: Levie Heritage [[id number]]  Attending Physician: Levie Heritage [[id number]]  PT Class (Do Not Modify): Observation [104]  PT Acc Code (Do Not Modify): Observation [10022]       B Medical/Surgery History Past Medical History:  Diagnosis Date  . Acute blood loss anemia 02/07/2017  . Anxiety   . Arthritis    deg disease, bulging disk,  shoulder level  . Bipolar disorder (HCC)   . Bipolar disorder (HCC)   . CHF (congestive heart failure) (HCC)   . 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 Depression    anxiety  .  Hyperlipidemia   . Hypertension   . Peptic ulcer disease    Review  . Pneumonia   . [name] (peripheral vascular disease) (HCC) 06/18/2015  . Shortness of breath    Past Surgical History:  Procedure Laterality Date  . BIOPSY  02/09/2017   Procedure: BIOPSY;  Surgeon: [name], MD;  [facility];  Service: Endoscopy;;  duodenal gastric  . COLONOSCOPY  03/14/2007   FAO:ZHYQMV colonoscopy and terminal ileoscopy except external hemorrhoids  . COLONOSCOPY N/A 05/05/2013   [name]: external/internal anal canal hemorrhoids, unable to intubate TI, segemental biopsies unremarkable   . COLONOSCOPY N/A 04/11/2017   Procedure: COLONOSCOPY;  Surgeon: [name], MD;  [facility];  Service: Endoscopy;  Laterality: N/A;  . COLONOSCOPY WITH PROPOFOL N/A 03/31/2017   Procedure: COLONOSCOPY WITH PROPOFOL;  Surgeon: [name], MD;  [facility];  Service: Endoscopy;  Laterality: N/A;  1:45pm  . ESOPHAGOGASTRODUODENOSCOPY  03/14/2007   HQI:[id number] antral gastritis with bulbar duodenitis/paucity to postbulbar duodenal folds and biopsy were benign with no evidence of villous atrophy.  . ESOPHAGOGASTRODUODENOSCOPY (EGD) WITH PROPOFOL N/A 02/09/2017   Procedure: ESOPHAGOGASTRODUODENOSCOPY (EGD) WITH PROPOFOL;  Surgeon: [name], MD;  [facility];  Service: Endoscopy;  Laterality: N/A;  . GIVENS CAPSULE STUDY N/A 04/08/2017   Procedure: GIVENS CAPSULE STUDY;  Surgeon: [name], MD;  [facility];  Service: Endoscopy;  Laterality: N/A;  . HAND SURGERY  left, secondary to self-inflicted laceration  . HEMORRHOID SURGERY N/A 04/14/2017   Procedure: HEMORRHOIDECTOMY;  Surgeon: [name], MD;  Location: [facility];  Service: General;  Laterality: N/A;  . SHOULDER SURGERY     right  . TOE SURGERY     left great toe , amputated-lawnmover accident     A IV Location/Drains/Wounds Patient Lines/Drains/Airways Status   Active  Line/Drains/Airways    Name:   Placement date:   Placement time:   Site:   Days:   Peripheral IV 03/24/19 Right Hand   03/24/19    1725    Hand   less than 1   Midline Single Lumen 03/02/19 Midline Left Basilic 8 cm 0 cm   03/02/19    1707    Basilic   22   Wound / Incision (Open or Dehisced) 12/30/18 Elbow Posterior;Right skin tear   12/30/18    2000    Elbow   84          Intake/Output Last 24 hours No intake or output data in the 24 hours ending 03/24/19 1958  Labs/Imaging Results for orders placed or performed during the hospital encounter of 03/24/19 (from the past 48 hour(s))  Urinalysis, Routine w reflex microscopic     Status: None   Collection Time: 03/24/19 12:15 PM  Result Value Ref Range   Color, Urine YELLOW YELLOW   APPearance CLEAR CLEAR   Specific Gravity, Urine 1.008 1.005 - 1.030   pH 7.0 5.0 - 8.0   Glucose, UA NEGATIVE NEGATIVE mg/dL   Hgb urine dipstick NEGATIVE NEGATIVE   Bilirubin Urine NEGATIVE NEGATIVE   Ketones, ur NEGATIVE NEGATIVE mg/dL   Protein, ur NEGATIVE NEGATIVE mg/dL   Nitrite NEGATIVE NEGATIVE   Leukocytes,Ua NEGATIVE NEGATIVE    Comment: Performed at [facility], [address] [place], [place] [address]  Comprehensive metabolic panel     Status: Abnormal   Collection Time: 03/24/19 12:32 PM  Result Value Ref Range   Sodium 134 (L) 135 - 145 mmol/L   Potassium 4.3 3.5 - 5.1 mmol/L   Chloride 97 (L) 98 - 111 mmol/L   CO2 27 22 - 32 mmol/L   Glucose, Bld 87 70 - 99 mg/dL   BUN 5 (L) 6 - 20 mg/dL   Creatinine, Ser 6.04 (L) 0.61 - 1.24 mg/dL   Calcium 8.6 (L) 8.9 - 10.3 mg/dL   Total Protein 6.1 (L) 6.5 - 8.1 g/dL   Albumin 2.4 (L) 3.5 - 5.0 g/dL   AST 45 (H) 15 - 41 U/L   ALT 78 (H) 0 - 44 U/L   Alkaline Phosphatase 431 (H) 38 - 126 U/L   Total Bilirubin 0.3 0.3 - 1.2 mg/dL   GFR calc non Af Amer >60 >60 mL/min   GFR calc Af Amer >60 >60 mL/min   Anion gap 10 5 - 15    Comment: Performed at [facility], [address]  [place], [place] [address]  Lactic acid, plasma     Status: None   Collection Time: 03/24/19 12:32 PM  Result Value Ref Range   Lactic Acid, Venous 1.0 0.5 - 1.9 mmol/L    Comment: Performed at [facility], [address], [place], [place] [address]  Troponin I - Once     Status: None   Collection Time: 03/24/19 12:32 PM  Result Value Ref Range   Troponin I <0.03 <0.03 ng/mL    Comment: Performed at [facility], [address] [place], [place]  [address]  CBC WITH DIFFERENTIAL     Status: Abnormal   Collection Time: 03/24/19 12:32 PM  Result Value Ref Range   WBC 7.5 4.0 - 10.5 K/uL   RBC 3.66 (L) 4.22 - 5.81 MIL/uL   Hemoglobin 9.6 (L) 13.0 - 17.0 g/dL   HCT 74.1 (L) 28.7 - 86.7 %   MCV 85.0 80.0 - 100.0 fL   MCH 26.2 26.0 - 34.0 pg   MCHC 30.9 30.0 - 36.0 g/dL   RDW 67.2 09.4 - 70.9 %   Platelets 289 150 - 400 K/uL   nRBC 0.0 0.0 - 0.2 %   Neutrophils Relative % 72 %   Neutro Abs 5.4 1.7 - 7.7 K/uL   Lymphocytes Relative 12 %   Lymphs Abs 0.9 0.7 - 4.0 K/uL   Monocytes Relative 11 %   Monocytes Absolute 0.8 0.1 - 1.0 K/uL   Eosinophils Relative 3 %   Eosinophils Absolute 0.2 0.0 - 0.5 K/uL   Basophils Relative 1 %   Basophils Absolute 0.0 0.0 - 0.1 K/uL   Immature Granulocytes 1 %   Abs Immature Granulocytes 0.09 (H) 0.00 - 0.07 K/uL    Comment: Performed at [facility], [address], [place], [place] [address]  Procalcitonin     Status: None   Collection Time: 03/24/19 12:32 PM  Result Value Ref Range   Procalcitonin <0.10 ng/mL    Comment:        Interpretation: PCT (Procalcitonin) <= 0.5 ng/mL: Systemic infection (sepsis) is not likely. Local bacterial infection is possible. (NOTE)       Sepsis PCT Algorithm           Lower Respiratory Tract                                      Infection PCT Algorithm    ----------------------------     ----------------------------         PCT < 0.25 ng/mL                PCT < 0.10 ng/mL         Strongly encourage              Strongly discourage   discontinuation of antibiotics    initiation of antibiotics    ----------------------------     -----------------------------       PCT 0.25 - 0.50 ng/mL            PCT 0.10 - 0.25 ng/mL               OR       >80% decrease in PCT            Discourage initiation of                                            antibiotics      Encourage discontinuation           of antibiotics    ----------------------------     -----------------------------         PCT >= 0.50 ng/mL              PCT 0.26 - 0.50 ng/mL               AND        <  80% decrease in PCT             Encourage initiation of                                             antibiotics       Encourage continuation           of antibiotics    ----------------------------     -----------------------------        PCT >= 0.50 ng/mL                  PCT > 0.50 ng/mL               AND         increase in PCT                  Strongly encourage                                      initiation of antibiotics    Strongly encourage escalation           of antibiotics                                     -----------------------------                                           PCT <= 0.25 ng/mL                                                 OR                                        > 80% decrease in PCT                                     Discontinue / Do not initiate                                             antibiotics Performed at [facility], [address], [place], [place] [address]   C-reactive protein     Status: Abnormal   Collection Time: 03/24/19 12:32 PM  Result Value Ref Range   CRP 13.0 (H) <1.0 mg/dL    Comment: Performed at [facility], [address], [place], [place] [address]  Culture, blood (routine x 2)     Status: None (Preliminary result)   Collection Time: 03/24/19 12:40 PM  Result Value Ref Range   Specimen Description RIGHT ANTECUBITAL    Special Requests      BOTTLES DRAWN AEROBIC AND  ANAEROBIC Blood Culture adequate volume Performed at [facility], [address] [place], [place] [address]    Culture PENDING    Report Status PENDING   Culture, blood (routine x 2)     Status: None (Preliminary result)   Collection Time: 03/24/19 12:44 PM  Result Value Ref Range   Specimen Description BLOOD RIGHT ARM    Special Requests      BOTTLES DRAWN AEROBIC AND ANAEROBIC Blood Culture adequate volume Performed at [facility], [address] [place], [place] [address]    Culture PENDING    Report Status PENDING   MRSA PCR Screening     Status: None   Collection Time: 03/24/19  2:10 PM  Result Value Ref Range   MRSA by PCR NEGATIVE NEGATIVE    Comment:        The GeneXpert MRSA Assay (FDA approved for NASAL specimens only), is one component of a comprehensive MRSA colonization surveillance program. It is not intended to diagnose MRSA infection nor to guide or monitor treatment for MRSA infections. Performed at [facility], [address] [place], [place] [address]   Lactic acid, plasma     Status: None   Collection Time: 03/24/19  2:18 PM  Result Value Ref Range   Lactic Acid, Venous 1.3 0.5 - 1.9 mmol/L    Comment: Performed at [facility], [address] [place], [place] [address]  SARS Coronavirus 2 [facility](Hospital order, Performed in [facility] hospital lab)     Status: None   Collection Time: 03/24/19  3:24 PM  Result Value Ref Range   SARS Coronavirus 2 NEGATIVE NEGATIVE    Comment: (NOTE) If result is NEGATIVE SARS-CoV-2 target nucleic acids are NOT DETECTED. The SARS-CoV-2 RNA is generally detectable in upper and lower  respiratory specimens during the acute phase of infection. The lowest  concentration of SARS-CoV-2 viral copies this assay can detect is 250  copies / mL. A negative result does not preclude SARS-CoV-2 infection  and should not be used as the sole basis for treatment or other  patient management decisions.  A negative result may occur with   improper specimen collection / handling, submission of specimen other  than nasopharyngeal swab, presence of viral mutation(s) within the  areas targeted by this assay, and inadequate number of viral copies  (<250 copies / mL). A negative result must be combined with clinical  observations, patient history, and epidemiological information. If result is POSITIVE SARS-CoV-2 target nucleic acids are DETECTED. The SARS-CoV-2 RNA is generally detectable in upper and lower  respiratory specimens dur ing the acute phase of infection.  Positive  results are indicative of active infection with SARS-CoV-2.  Clinical  correlation with patient history and other diagnostic information is  necessary to determine patient infection status.  Positive results do  not rule out bacterial infection or co-infection with other viruses. If result is PRESUMPTIVE POSTIVE SARS-CoV-2 nucleic acids MAY BE PRESENT.   A presumptive positive result was obtained on the submitted specimen  and confirmed on repeat testing.  While 2019 novel coronavirus  (SARS-CoV-2) nucleic acids may be present in the submitted sample  additional confirmatory testing may be necessary for epidemiological  and / or clinical management purposes  to differentiate between  SARS-CoV-2 and other Sarbecovirus currently known to infect humans.  If clinically indicated additional testing with an alternate test  methodology [phone]([id number]) is advised. The SARS-CoV-2 RNA is generally  detectable in upper and lower respiratory sp ecimens during the acute  phase of infection. The expected result is  Negative. Fact Sheet for Patients:  BoilerBrush.com.cy Fact Sheet for Healthcare Providers: https://pope.com/ This test is not yet approved or cleared by the [place] FDA and has been authorized for detection and/or diagnosis of SARS-CoV-2 by FDA under an Emergency Use Authorization (EUA).  This EUA will  remain in effect (meaning this test can be used) for the duration of the COVID-19 declaration under Section 564(b)(1) of the Act, 21 U.S.C. section 360bbb-3(b)(1), unless the authorization is terminated or revoked sooner. Performed at [facility], [address], [place], [place] [address]    Dg Chest Portable 1 View  Result Date: 03/24/2019 CLINICAL DATA:  Fever with recent hospitalization for pneumonia. COPD. EXAM: PORTABLE CHEST 1 VIEW COMPARISON:  03/06/2019, 02/26/2019 and CT 02/28/2019 FINDINGS: Lungs are adequately inflated demonstrate persistent bilateral airspace process over the left upper lung and right mid to lower lung. There is improved aeration over the mid to upper lungs and worsening opacification over the right base. No effusion. Cardiomediastinal silhouette and remainder of the exam is unchanged. IMPRESSION: Persistent multifocal airspace process likely pneumonia improved in the mid to upper lungs are worse in the right base. Electronically Signed   By: Elberta Fortis M.D.   On: 03/24/2019 13:06    Pending Labs Unresulted Labs (From admission, onward)    Start     Ordered   03/24/19 1747  Respiratory Panel by PCR  (Respiratory virus panel with precautions)  Add-on,   R     03/24/19 1746   Signed and Held  Basic metabolic panel  Tomorrow morning,   R     Signed and Held   Signed and Held  CBC  Tomorrow morning,   R     Signed and Held   Signed and Held  C-reactive protein  Tomorrow morning,   R     Signed and Held          Vitals/Pain Today's Vitals   03/24/19 1707 03/24/19 1730 03/24/19 1800 03/24/19 1830  BP: 125/72 126/67 119/70 133/71  Pulse: 87 88 87 95  Resp: 18     Temp: 99 F (37.2 C)     TempSrc: Oral     SpO2: 96% 97% 98% 98%  Weight:      Height:      PainSc:        Isolation Precautions Droplet precaution  Medications Medications  sodium chloride 0.9 % bolus 1,000 mL (0 mLs Intravenous Stopped 03/24/19 1339)    Followed by  0.9 %  sodium  chloride infusion (1,000 mLs Intravenous Transfusing/Transfer 03/24/19 1958)  vancomycin (VANCOCIN) IVPB 750 mg/150 ml premix (has no administration in time range)  aztreonam (AZACTAM) 2 g in sodium chloride 0.9 % 100 mL IVPB (0 g Intravenous Stopped 03/24/19 1445)  vancomycin (VANCOCIN) 1,250 mg in sodium chloride 0.9 % 250 mL IVPB (0 mg Intravenous Stopped 03/24/19 1657)    Mobility walks Low fall risk   Focused Assessments Respiratory   R Recommendations: See Admitting Provider Note  Report given to: Tinnie Gens RN  Additional Notes:

## 2019-03-24 NOTE — ED Provider Notes (Signed)
[facility] EMERGENCY DEPARTMENT Provider Note   CSN: [id number] Arrival date & time: 03/24/19  1101    History   Chief Complaint Chief Complaint  Patient presents with  . Fever    HPI Jeffrey [name] is a 58 y.o. male.     Patient is a 58 year old male who presents to the emergency department at the request of his primary physician because of elevated temperature and a low blood pressure.  The patient was discharged from the hospital on April 14.  He was diagnosed at that time with sepsis, hypertension, acute renal failure with hypoxia, and interstitial pneumonia.  The patient states that on 2 days ago he had a temperature elevation of 103.7, and then had another temperature elevation of 103 the following day.  His home nurse came out to visit him and he was noted to have a low blood pressure.  She notified his physician, and he was advised to come to the hospital for evaluation.  It is of note that the patient requires home oxygen.  No recent injury or trauma to the chest.  The patient states that he has not been around anyone who is been sick.  He lives with his spouse, and she is well as far as he is aware.  It is also of note that the patient had more than 1 COVID-19 virus testing while in the hospital.  The history is provided by the patient.    Past Medical History:  Diagnosis Date  . Acute blood loss anemia 02/07/2017  . Anxiety   . Arthritis    deg disease, bulging disk,  shoulder level  . Bipolar disorder (HCC)   . Bipolar disorder (HCC)   . CHF (congestive heart failure) (HCC)   . 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 Depression    anxiety  . Hyperlipidemia   . Hypertension   . Peptic ulcer disease    Review  . Pneumonia   . [name] (peripheral vascular disease) (HCC) 06/18/2015  . Shortness of breath     Patient Active Problem List   Diagnosis Date Noted  . Sepsis due to undetermined organism (HCC) 02/16/2019  . Acute respiratory failure with hypoxia (HCC)  02/16/2019  . Interstitial pneumonia (HCC) 02/16/2019  . Cough   . Dysphagia   . Palliative care by specialist   . Goals of care, counseling/discussion   . Wernicke encephalopathy syndrome 01/07/2019  . Toxic encephalopathy 12/16/2018  . Acute hepatic encephalopathy 12/16/2018  . Respiratory failure (HCC) 12/16/2018  . Hepatic encephalopathy (HCC)   . Acute respiratory failure with hypoxemia (HCC)   . Alcohol withdrawal syndrome, with delirium (HCC)   . COPD exacerbation (HCC) 09/16/2018  . Essential hypertension 09/16/2018  . GERD (gastroesophageal reflux disease) 09/16/2018  . Hypoxia   . Chronic diastolic CHF (congestive heart failure) (HCC)   . Bipolar disorder with severe depression (HCC) 02/01/2018  . Alcohol use disorder, severe, dependence (HCC) 02/01/2018  . Cannabis use disorder, severe, dependence (HCC) 02/01/2018  . MDD (major depressive disorder), recurrent episode, severe (HCC) 01/31/2018  . Internal and external bleeding hemorrhoids   . Rectal bleeding   . Iron deficiency anemia due to chronic blood loss 03/09/2017  . Anemia   . GIB (gastrointestinal bleeding) 02/07/2017  . Acute blood loss anemia 02/07/2017  . Weakness generalized 02/07/2017  . Dizziness 02/07/2017  . Peptic ulcer disease   . Bipolar disorder (HCC)   . 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 Dyspnea 08/26/2016  . H/O: depression  08/26/2016  . Dyslipidemia 08/26/2016  . Tobacco use disorder 08/26/2016  . [name] (peripheral vascular disease) (HCC) 06/18/2015  . Chronic diarrhea 05/02/2013  . Hematochezia 05/02/2013  . Abnormal weight loss 05/02/2013  . Abdominal pain, periumbilical 05/02/2013    Past Surgical History:  Procedure Laterality Date  . BIOPSY  02/09/2017   Procedure: BIOPSY;  Surgeon: [name], MD;  Location: [facility];  Service: Endoscopy;;  duodenal gastric  . COLONOSCOPY  03/14/2007   [id number] colonoscopy and terminal ileoscopy except external hemorrhoids  . COLONOSCOPY  N/A 05/05/2013   [name]: external/internal anal canal hemorrhoids, unable to intubate TI, segemental biopsies unremarkable   . COLONOSCOPY N/A 04/11/2017   Procedure: COLONOSCOPY;  Surgeon: [name], MD;  Location: [facility];  Service: Endoscopy;  Laterality: N/A;  . COLONOSCOPY WITH PROPOFOL N/A 03/31/2017   Procedure: COLONOSCOPY WITH PROPOFOL;  Surgeon: [name], MD;  Location: [facility];  Service: Endoscopy;  Laterality: N/A;  1:45pm  . ESOPHAGOGASTRODUODENOSCOPY  03/14/2007   [id number] antral gastritis with bulbar duodenitis/paucity to postbulbar duodenal folds and biopsy were benign with no evidence of villous atrophy.  . ESOPHAGOGASTRODUODENOSCOPY (EGD) WITH PROPOFOL N/A 02/09/2017   Procedure: ESOPHAGOGASTRODUODENOSCOPY (EGD) WITH PROPOFOL;  Surgeon: [name], MD;  Location: [facility];  Service: Endoscopy;  Laterality: N/A;  . GIVENS CAPSULE STUDY N/A 04/08/2017   Procedure: GIVENS CAPSULE STUDY;  Surgeon: [name], MD;  Location: [facility];  Service: Endoscopy;  Laterality: N/A;  . HAND SURGERY     left, secondary to self-inflicted laceration  . HEMORRHOID SURGERY N/A 04/14/2017   Procedure: HEMORRHOIDECTOMY;  Surgeon: [name], MD;  Location: [facility];  Service: General;  Laterality: N/A;  . SHOULDER SURGERY     right  . TOE SURGERY     left great toe , amputated-lawnmover accident        Home Medications    Prior to Admission medications   Medication Sig Start Date End Date Taking? Authorizing Provider  albuterol (PROAIR HFA) 108 (90 Base) MCG/ACT inhaler Inhale 2 puffs into the lungs every 4 (four) hours as needed for wheezing or shortness of breath. 03/07/19  Yes Rhetta Mura, MD  albuterol (PROVENTIL) (2.5 MG/3ML) 0.083% nebulizer solution Take 3 mLs (2.5 mg total) by nebulization every 4 (four) hours as needed for shortness of breath. 01/13/19  Yes Mikhail, Nita Sells, DO  aspirin EC 81 MG tablet Take 81 mg by mouth every  evening.    Yes [provider]  atorvastatin (LIPITOR) 20 MG tablet Take 1 tablet (20 mg total) by mouth daily. For high cholesterol 02/09/18  Yes Money, Gerlene Burdock, FNP  diltiazem (CARDIZEM CD) 180 MG 24 hr capsule Take 1 capsule (180 mg total) by mouth daily. 03/08/19  Yes Rhetta Mura, MD  divalproex (DEPAKOTE ER) 500 MG 24 hr tablet Take 3 tablets (1,500 mg total) by mouth at bedtime. 11/28/18  Yes Myrlene Broker, MD  FLUoxetine (PROZAC) 40 MG capsule Take 1 capsule (40 mg total) by mouth daily. For mood control 11/28/18  Yes Myrlene Broker, MD  gabapentin (NEURONTIN) 400 MG capsule Take 1 capsule (400 mg total) by mouth 3 (three) times daily. Patient taking differently: Take 400-800 mg by mouth See admin instructions.  in the morning and  in the evening 11/28/18  Yes Myrlene Broker, MD  ipratropium (ATROVENT) 0.02 % nebulizer solution Take 2.5 mLs (0.5 mg total) by nebulization 3 (three) times daily. 01/13/19  Yes Mikhail, Nita Sells, DO  levofloxacin (LEVAQUIN) 500 MG tablet Take 1 tablet by mouth daily. 03/22/19  Yes [provider]  methocarbamol (ROBAXIN) 500 MG tablet Take 1 tablet by mouth 3 (three) times daily. 03/16/19  Yes [provider]  metoprolol tartrate (LOPRESSOR) 25 MG tablet Take 1 tablet (25 mg total) by mouth 2 (two) times daily. 03/07/19  Yes Rhetta Mura, MD  Multiple Vitamin (MULTIVITAMIN WITH MINERALS) TABS tablet Take 1 tablet by mouth daily. 01/13/19  Yes Mikhail, Maryann, DO  OLANZapine (ZYPREXA) 10 MG tablet Take 1.5 tablets (15 mg total) by mouth at bedtime. For mood stability Patient taking differently: Take 10 mg by mouth at bedtime. For mood stability 11/28/18  Yes Myrlene Broker, MD  potassium chloride SA (K-DUR,KLOR-CON) 20 MEQ tablet Take 4 tablets (80 mEq total) by mouth 2 (two) times daily. 03/07/19  Yes Rhetta Mura, MD  predniSONE (DELTASONE) 20 MG tablet Take 2 tablets (40 mg total) by mouth daily before  breakfast. 03/08/19  Yes Rhetta Mura, MD  thiamine (VITAMIN B-1) 100 MG tablet Take 1 tablet (100 mg total) by mouth daily. Patient taking differently: Take 250 mg by mouth daily.  01/13/19  Yes Mikhail, Nita Sells, DO  traZODone (DESYREL) 50 MG tablet Take 1 tablet (50 mg total) by mouth at bedtime as needed for sleep. Patient taking differently: Take 50 mg by mouth at bedtime.  11/28/18  Yes Myrlene Broker, MD  levofloxacin (LEVAQUIN) 750 MG tablet Take 1 tablet (750 mg total) by mouth daily. Patient not taking: Reported on 03/24/2019 03/08/19   Rhetta Mura, MD  Maltodextrin-Xanthan Gum (RESOURCE THICKENUP CLEAR) POWD Take 120 g by mouth as needed. Patient not taking: Reported on 03/24/2019 03/07/19   Rhetta Mura, MD    Family History Family History  Problem Relation Age of Onset  . Breast cancer Mother        deceased  . Heart disease Father   . Depression Daughter   . Anxiety disorder Daughter   . Anxiety disorder Son   . Depression Son   . Asthma Brother   . Heart attack Maternal Aunt   . Heart attack Maternal Uncle   . Heart attack Paternal Aunt   . Heart attack Paternal Uncle   . Heart attack Maternal Grandmother   . Heart attack Maternal Grandfather   . Emphysema Maternal Grandfather   . Heart attack Paternal Grandmother   . Heart attack Paternal Grandfather   . Colon cancer Neg Hx   . Liver disease Neg Hx     Social History Social History   Tobacco Use  . Smoking status: Former Smoker    Packs/day: 1.00    Years: 40.00    Pack years: 40.00    Types: Cigarettes    Start date: 08/04/1971  . Smokeless tobacco: Never Used  . Tobacco comment: peak rate of 2.5ppd, 1/2ppd on 11/27/2016 -- 6 cigarettes / day 01/25/18  Substance Use Topics  . Alcohol use: No    Alcohol/week: 0.0 standard drinks  . Drug use: Not Currently    Types: Marijuana    Comment: most days      Allergies   Augmentin [amoxicillin-pot clavulanate] and Ace inhibitors   Review  of Systems Review of Systems  Constitutional: Positive for activity change and appetite change. Negative for fever.  Respiratory: Positive for shortness of breath. Negative for cough.   Musculoskeletal: Positive for arthralgias.     Physical Exam Updated Vital Signs BP 106/66   Pulse  73   Temp 98.3 F (36.8 C) (Oral)   Resp (!) 23   Ht 5\' 10"  (1.778 m)   Wt 63 kg   SpO2 95%   BMI 19.94 kg/m   Physical Exam Vitals signs and nursing note reviewed.  Constitutional:      Appearance: He is well-developed. He is not toxic-appearing.  HENT:     Head: Normocephalic.     Right Ear: Tympanic membrane and external ear normal.     Left Ear: Tympanic membrane and external ear normal.  Eyes:     General: Lids are normal.     Pupils: Pupils are equal, round, and reactive to light.  Neck:     Musculoskeletal: Normal range of motion and neck supple.     Vascular: No carotid bruit.  Cardiovascular:     Rate and Rhythm: Normal rate and regular rhythm.     Pulses: Normal pulses.     Heart sounds: Normal heart sounds.  Pulmonary:     Effort: No accessory muscle usage, respiratory distress or retractions.     Breath sounds: Wheezing and rhonchi present.     Comments: Patient speaks in complete sentences without problem. Abdominal:     General: Bowel sounds are normal.     Palpations: Abdomen is soft.     Tenderness: There is no abdominal tenderness. There is no guarding.  Musculoskeletal: Normal range of motion.  Lymphadenopathy:     Head:     Right side of head: No submandibular adenopathy.     Left side of head: No submandibular adenopathy.     Cervical: No cervical adenopathy.  Skin:    General: Skin is warm and dry.  Neurological:     Mental Status: He is alert and oriented to person, place, and time.     Cranial Nerves: No cranial nerve deficit.     Sensory: No sensory deficit.  Psychiatric:        Speech: Speech normal.      ED Treatments / Results  Labs (all labs  ordered are listed, but only abnormal results are displayed) Labs Reviewed  COMPREHENSIVE METABOLIC PANEL - Abnormal; Notable for the following components:      Result Value   Sodium 134 (*)    Chloride 97 (*)    BUN 5 (*)    Creatinine, Ser 0.49 (*)    Calcium 8.6 (*)    Total Protein 6.1 (*)    Albumin 2.4 (*)    AST 45 (*)    ALT 78 (*)    Alkaline Phosphatase 431 (*)    All other components within normal limits  CULTURE, BLOOD (ROUTINE X 2)  CULTURE, BLOOD (ROUTINE X 2)  MRSA PCR SCREENING  LACTIC ACID, PLASMA  TROPONIN I  URINALYSIS, ROUTINE W REFLEX MICROSCOPIC  LACTIC ACID, PLASMA    EKG EKG Interpretation  Date/Time:  Friday Mar 24 2019 11:17:08 EDT Ventricular Rate:  76 PR Interval:    QRS Duration: 93 QT Interval:  413 QTC Calculation: 465 R Axis:   88 Text Interpretation:  Sinus rhythm Left ventricular hypertrophy Confirmed by Geoffery Lyons ([id number]) on 03/24/2019 12:15:00 PM   Radiology Dg Chest Portable 1 View  Result Date: 03/24/2019 CLINICAL DATA:  Fever with recent hospitalization for pneumonia. COPD. EXAM: PORTABLE CHEST 1 VIEW COMPARISON:  03/06/2019, 02/26/2019 and CT 02/28/2019 FINDINGS: Lungs are adequately inflated demonstrate persistent bilateral airspace process over the left upper lung and right mid to lower lung. There is improved aeration  over the mid to upper lungs and worsening opacification over the right base. No effusion. Cardiomediastinal silhouette and remainder of the exam is unchanged. IMPRESSION: Persistent multifocal airspace process likely pneumonia improved in the mid to upper lungs are worse in the right base. Electronically Signed   By: Elberta Fortis M.D.   On: 03/24/2019 13:06    Procedures Procedures (including critical care time)  Medications Ordered in ED Medications  sodium chloride 0.9 % bolus 1,000 mL (0 mLs Intravenous Stopped 03/24/19 1339)    Followed by  0.9 %  sodium chloride infusion (has no administration in time  range)  aztreonam (AZACTAM) 2 g in sodium chloride 0.9 % 100 mL IVPB (2 g Intravenous New Bag/Given 03/24/19 1403)  vancomycin (VANCOCIN) 1,250 mg in sodium chloride 0.9 % 250 mL IVPB (has no administration in time range)  vancomycin (VANCOCIN) IVPB 750 mg/150 ml premix (has no administration in time range)     Initial Impression / Assessment and Plan / ED Course  I have reviewed the triage vital signs and the nursing notes.  Pertinent labs & imaging results that were available during my care of the patient were reviewed by me and considered in my medical decision making (see chart for details).          Final Clinical Impressions(s) / ED Diagnoses MDM  Vital signs reviewed.  Pulse oximetry ranging from 95 to 100% on 2 L of oxygen.  It is of note that the patient is on oxygen at home.  The patient has had hospitalizations recently for respiratory failure and respiratory issues.  He is had 2 or 3- COVID-19 test done up to this point.  He has required intubations x3 since February.  We will recheck lab work and chest x-ray, as the patient does have a complex chest x-ray in the past reviewed by me.  The patient's home health nurse noted the blood pressure to be low.  Patient gave [place] a history of 2 days of temperature elevation of 103.7, and 103. IV fluids started.  The complete blood count returned showing a white blood cells to be normal at 7500.  The hemoglobin is low at 9.6 and the hematocrit is low at 31.1.  There is no shift to the left.  The initial troponin is less than 0.03.  Doubt cardiac event causing the low blood pressure. Blood cultures obtained.  The comprehensive metabolic panel shows the hepatic studies elevated with AST elevated at 45, ALT elevated at 78, and alkaline phosphatase elevated at 431.  The BUN is low at 5, and the creatinine is low at 0.49.  The sodium is slightly low at 134, and the chloride is slightly low at 97.  The potassium, CO2, and glucose are within normal  range.  Chest x-ray shows persistent bilateral airspace process over the left upper lung and the right mid to lower lung.  There is improved aeration over the mid to upper lungs.  However there is worsening opacification over the right base.  There is no effusion noted.  Antibiotics for hospital-acquired pneumonia started on the patient.  Case discussed with the triad hospitalist.  A COVID-19 tests is requested to determine the location for the patient's admission. COVID test, C-reactive protein, and prolactin pending.  Patient's care to be continued by Langston Masker, PA-C.  Patient has been made aware of the findings up to this point.  He is in agreement with the admission.    Final diagnoses:  HAP (hospital-acquired pneumonia)  ED Discharge Orders    None       Ivery QualeBryant, Olof Marcil, PA-C 03/25/19 1047    Geoffery Lyonselo, [place], MD 03/29/19 [id number]

## 2019-03-24 NOTE — Care Management Obs Status (Signed)
MEDICARE OBSERVATION STATUS NOTIFICATION   Patient Details  Name: Jeffrey [name] MRN: [id number] Date of Birth: 07/08/1961   Medicare Observation Status Notification Given:  Yes    Celica Kotowski Sherryle Lis, LCSW 03/24/2019, 10:15 PM

## 2019-03-24 NOTE — H&P (Signed)
History and Physical  Jeffrey [name] ZOX:[id number] DOB: 20-Sep-1961 DOA: 03/24/2019  Referring physician: Ivery Quale, PA-C, ED provider PCP: Gareth Morgan, MD  Outpatient Specialists:   Patient Coming From: home  Chief Complaint: Fever  HPI: Jeffrey [name] is a 58 y.o. male with a history of severe COPD, chronic diastolic heart failure, GERD, bipolar disorder with depression.  Patient has had 2 hospitalizations: Was hospitalized from 12/16/2020 to 01/13/2019 for Klebsiella pneumonia with acute respiratory failure and intubation.  He was admitted on 3/26 for fevers and cough, reintubated on 3/27 and eventually sent home on 03/07/2019.  He was sent home on 14 days of Levaquin and a steroid taper.  He has completed both of these.  The patient has had a fever over the past 2 days, as high as 103.  He was seen by the visiting home nurse, who sent him to the hospital for evaluation.  He still has slight cough occasionally but does not complain of shortness of breath.  He denies wheezing.  No palliating or provoking factors to his fever.  Emergency Department Course: Chest x-ray shows worsening right infiltrate.  White count normal.  Procalcitonin less than 0.1.  Lactic acid normal CRP 13.  Patient tested negative for coronavirus.  Review of Systems:   Pt denies any nausea, vomiting, diarrhea, constipation, abdominal pain, shortness of breath, dyspnea on exertion, orthopnea, wheezing, palpitations, headache, vision changes, lightheadedness, dizziness, melena, rectal bleeding.  Review of systems are otherwise negative  Past Medical History:  Diagnosis Date  . Acute blood loss anemia 02/07/2017  . Anxiety   . Arthritis    deg disease, bulging disk,  shoulder level  . Bipolar disorder (HCC)   . Bipolar disorder (HCC)   . CHF (congestive heart failure) (HCC)   . 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 Depression    anxiety  . Hyperlipidemia   . Hypertension   . Peptic ulcer disease    Review  .  Pneumonia   . [name] (peripheral vascular disease) (HCC) 06/18/2015  . Shortness of breath    Past Surgical History:  Procedure Laterality Date  . BIOPSY  02/09/2017   Procedure: BIOPSY;  Surgeon: [name], MD;  Location: [facility];  Service: Endoscopy;;  duodenal gastric  . COLONOSCOPY  03/14/2007   [id number] colonoscopy and terminal ileoscopy except external hemorrhoids  . COLONOSCOPY N/A 05/05/2013   [name]: external/internal anal canal hemorrhoids, unable to intubate TI, segemental biopsies unremarkable   . COLONOSCOPY N/A 04/11/2017   Procedure: COLONOSCOPY;  Surgeon: [name], MD;  Location: [facility];  Service: Endoscopy;  Laterality: N/A;  . COLONOSCOPY WITH PROPOFOL N/A 03/31/2017   Procedure: COLONOSCOPY WITH PROPOFOL;  Surgeon: [name], MD;  [facility]: [facility];  Service: Endoscopy;  Laterality: N/A;  1:45pm  . ESOPHAGOGASTRODUODENOSCOPY  03/14/2007   [id number] antral gastritis with bulbar duodenitis/paucity to postbulbar duodenal folds and biopsy were benign with no evidence of villous atrophy.  . ESOPHAGOGASTRODUODENOSCOPY (EGD) WITH PROPOFOL N/A 02/09/2017   Procedure: ESOPHAGOGASTRODUODENOSCOPY (EGD) WITH PROPOFOL;  Surgeon: [name], MD;  [facility]: [facility];  Service: Endoscopy;  Laterality: N/A;  . GIVENS CAPSULE STUDY N/A 04/08/2017   Procedure: GIVENS CAPSULE STUDY;  Surgeon: [name], MD;  [facility]: [facility];  Service: Endoscopy;  Laterality: N/A;  . HAND SURGERY     left, secondary to self-inflicted laceration  . HEMORRHOID SURGERY N/A 04/14/2017   Procedure: HEMORRHOIDECTOMY;  Surgeon: [name], MD;  Location: [facility];  Service: General;  Laterality: N/A;  . SHOULDER SURGERY     right  . TOE SURGERY     left great toe , amputated-lawnmover accident   Social History:  reports that he has quit smoking. His smoking use included cigarettes. He started smoking about 47 years ago. He has a 40.00 pack-year smoking  history. He has never used smokeless tobacco. He reports previous drug use. Drug: Marijuana. He reports that he does not drink alcohol. Patient lives at home  Allergies  Allergen Reactions  . Augmentin [Amoxicillin-Pot Clavulanate] Rash and Other (See Comments)    Has patient had a PCN reaction causing immediate rash, facial/tongue/throat swelling, SOB or lightheadedness with hypotension: No Has patient had a PCN reaction causing severe rash involving mucus membranes or skin necrosis: No Has patient had a PCN reaction that required hospitalization No Has patient had a PCN reaction occurring within the last 10 years: Yes If all of the above answers are "NO", then may proceed with Cephalosporin use.  Donivan Scull Inhibitors Hives    Family History  Problem Relation Age of Onset  . Breast cancer Mother        deceased  . Heart disease Father   . Depression Daughter   . Anxiety disorder Daughter   . Anxiety disorder Son   . Depression Son   . Asthma Brother   . Heart attack Maternal Aunt   . Heart attack Maternal Uncle   . Heart attack Paternal Aunt   . Heart attack Paternal Uncle   . Heart attack Maternal Grandmother   . Heart attack Maternal Grandfather   . Emphysema Maternal Grandfather   . Heart attack Paternal Grandmother   . Heart attack Paternal Grandfather   . Colon cancer Neg Hx   . Liver disease Neg Hx       Prior to Admission medications   Medication Sig Start Date End Date Taking? Authorizing Provider  albuterol (PROAIR HFA) 108 (90 Base) MCG/ACT inhaler Inhale 2 puffs into the lungs every 4 (four) hours as needed for wheezing or shortness of breath. 03/07/19  Yes Rhetta Mura, MD  albuterol (PROVENTIL) (2.5 MG/3ML) 0.083% nebulizer solution Take 3 mLs (2.5 mg total) by nebulization every 4 (four) hours as needed for shortness of breath. 01/13/19  Yes Mikhail, Nita Sells, DO  aspirin EC 81 MG tablet Take 81 mg by mouth every evening.    Yes [provider]   atorvastatin (LIPITOR) 20 MG tablet Take 1 tablet (20 mg total) by mouth daily. For high cholesterol 02/09/18  Yes Money, Gerlene Burdock, FNP  diltiazem (CARDIZEM CD) 180 MG 24 hr capsule Take 1 capsule (180 mg total) by mouth daily. 03/08/19  Yes Rhetta Mura, MD  divalproex (DEPAKOTE ER) 500 MG 24 hr tablet Take 3 tablets (1,500 mg total) by mouth at bedtime. 11/28/18  Yes Myrlene Broker, MD  FLUoxetine (PROZAC) 40 MG capsule Take 1 capsule (40 mg total) by mouth daily. For mood control 11/28/18  Yes Myrlene Broker, MD  gabapentin (NEURONTIN) 400 MG capsule Take 1 capsule (400 mg total) by mouth 3 (three) times daily. Patient taking differently: Take 400-800 mg by mouth See admin instructions. 400mg  in the morning and 800mg  in the evening 11/28/18  Yes Myrlene Broker, MD  ipratropium (ATROVENT) 0.02 % nebulizer solution Take 2.5 mLs (0.5 mg total) by nebulization 3 (three) times daily. 01/13/19  Yes Mikhail, Nita Sells, DO  levofloxacin (LEVAQUIN) 500 MG tablet Take 1 tablet by  mouth daily. 03/22/19  Yes [provider]  methocarbamol (ROBAXIN) 500 MG tablet Take 1 tablet by mouth 3 (three) times daily. 03/16/19  Yes [provider]  metoprolol tartrate (LOPRESSOR) 25 MG tablet Take 1 tablet (25 mg total) by mouth 2 (two) times daily. 03/07/19  Yes Rhetta MuraSamtani, Jai-Gurmukh, MD  Multiple Vitamin (MULTIVITAMIN WITH MINERALS) TABS tablet Take 1 tablet by mouth daily. 01/13/19  Yes Mikhail, Maryann, DO  OLANZapine (ZYPREXA) 10 MG tablet Take 1.5 tablets (15 mg total) by mouth at bedtime. For mood stability Patient taking differently: Take 10 mg by mouth at bedtime. For mood stability 11/28/18  Yes Myrlene Brokeross, Deborah R, MD  potassium chloride SA (K-DUR,KLOR-CON) 20 MEQ tablet Take 4 tablets (80 mEq total) by mouth 2 (two) times daily. 03/07/19  Yes Rhetta MuraSamtani, Jai-Gurmukh, MD  predniSONE (DELTASONE) 20 MG tablet Take 2 tablets (40 mg total) by mouth daily before breakfast. 03/08/19  Yes Rhetta MuraSamtani, Jai-Gurmukh, MD   thiamine (VITAMIN B-1) 100 MG tablet Take 1 tablet (100 mg total) by mouth daily. Patient taking differently: Take 250 mg by mouth daily.  01/13/19  Yes Mikhail, Nita SellsMaryann, DO  traZODone (DESYREL) 50 MG tablet Take 1 tablet (50 mg total) by mouth at bedtime as needed for sleep. Patient taking differently: Take 50 mg by mouth at bedtime.  11/28/18  Yes Myrlene Brokeross, Deborah R, MD  levofloxacin (LEVAQUIN) 750 MG tablet Take 1 tablet (750 mg total) by mouth daily. Patient not taking: Reported on 03/24/2019 03/08/19   Rhetta MuraSamtani, Jai-Gurmukh, MD  Maltodextrin-Xanthan Gum (RESOURCE THICKENUP CLEAR) POWD Take 120 g by mouth as needed. Patient not taking: Reported on 03/24/2019 03/07/19   Rhetta MuraSamtani, Jai-Gurmukh, MD    Physical Exam: BP 125/72 (BP Location: Right Arm)   Pulse 87   Temp 99 F (37.2 C) (Oral)   Resp 18   Ht 5\' 10"  (1.778 m)   Wt 63 kg   SpO2 96%   BMI 19.94 kg/m   . General: Middle-age Caucasian male. Awake and alert and oriented x3. No acute cardiopulmonary distress.  Marland Kitchen. HEENT: Normocephalic atraumatic.  Right and left ears normal in appearance.  Pupils equal, round, reactive to light. Extraocular muscles are intact. Sclerae anicteric and noninjected.  Moist mucosal membranes. No mucosal lesions.  . Neck: Neck supple without lymphadenopathy. No carotid bruits. No masses palpated.  . Cardiovascular: Regular rate with normal S1-S2 sounds. No murmurs, rubs, gallops auscultated. No JVD.  Marland Kitchen. Respiratory: Prolonged exhalation.  Rales in right mid and lower lung field.  No wheezing.  No accessory muscle use. . Abdomen: Soft, nontender, nondistended. Active bowel sounds. No masses or hepatosplenomegaly  . Skin: No rashes, lesions, or ulcerations.  Dry, warm to touch. 2+ dorsalis pedis and radial pulses. . Musculoskeletal: No calf or leg pain. All major joints not erythematous nontender.  No upper or lower joint deformation.  Good ROM.  No contractures  . Psychiatric: Intact judgment and insight. Pleasant and  cooperative. . Neurologic: No focal neurological deficits. Strength is 5/5 and symmetric in upper and lower extremities.  Cranial nerves II through XII are grossly intact.           Labs on Admission: I have personally reviewed following labs and imaging studies  CBC: Recent Labs  Lab 03/24/19 1232  WBC 7.5  NEUTROABS 5.4  HGB 9.6*  HCT 31.1*  MCV 85.0  PLT 289   Basic Metabolic Panel: Recent Labs  Lab 03/24/19 1232  NA 134*  K 4.3  CL 97*  CO2  27  GLUCOSE 87  BUN 5*  CREATININE 0.49*  CALCIUM 8.6*   GFR: Estimated Creatinine Clearance: 89.8 mL/min (A) (by C-G formula based on SCr of 0.49 mg/dL (L)). Liver Function Tests: Recent Labs  Lab 03/24/19 1232  AST 45*  ALT 78*  ALKPHOS 431*  BILITOT 0.3  PROT 6.1*  ALBUMIN 2.4*   No results for input(s): LIPASE, AMYLASE in the last 168 hours. No results for input(s): AMMONIA in the last 168 hours. Coagulation Profile: No results for input(s): INR, PROTIME in the last 168 hours. Cardiac Enzymes: Recent Labs  Lab 03/24/19 1232  TROPONINI <0.03   BNP (last 3 results) No results for input(s): PROBNP in the last 8760 hours. HbA1C: No results for input(s): HGBA1C in the last 72 hours. CBG: No results for input(s): GLUCAP in the last 168 hours. Lipid Profile: No results for input(s): CHOL, HDL, LDLCALC, TRIG, CHOLHDL, LDLDIRECT in the last 72 hours. Thyroid Function Tests: No results for input(s): TSH, T4TOTAL, FREET4, T3FREE, THYROIDAB in the last 72 hours. Anemia Panel: No results for input(s): VITAMINB12, FOLATE, FERRITIN, TIBC, IRON, RETICCTPCT in the last 72 hours. Urine analysis:    Component Value Date/Time   COLORURINE YELLOW 03/24/2019 1215   APPEARANCEUR CLEAR 03/24/2019 1215   LABSPEC 1.008 03/24/2019 1215   PHURINE 7.0 03/24/2019 1215   GLUCOSEU NEGATIVE 03/24/2019 1215   HGBUR NEGATIVE 03/24/2019 1215   BILIRUBINUR NEGATIVE 03/24/2019 1215   KETONESUR NEGATIVE 03/24/2019 1215   PROTEINUR  NEGATIVE 03/24/2019 1215   UROBILINOGEN 0.2 03/01/2011 1427   NITRITE NEGATIVE 03/24/2019 1215   LEUKOCYTESUR NEGATIVE 03/24/2019 1215   Sepsis Labs: (procalcitonin:4,lacticidven:4) ) Recent Results (from the past 240 hour(s))  Culture, blood (routine x 2)     Status: None (Preliminary result)   Collection Time: 03/24/19 12:40 PM  Result Value Ref Range Status   Specimen Description RIGHT ANTECUBITAL  Final   Special Requests   Final    BOTTLES DRAWN AEROBIC AND ANAEROBIC Blood Culture adequate volume Performed at [facility], [address] [place], [place] [address]    Culture PENDING  Incomplete   Report Status PENDING  Incomplete  Culture, blood (routine x 2)     Status: None (Preliminary result)   Collection Time: 03/24/19 12:44 PM  Result Value Ref Range Status   Specimen Description BLOOD RIGHT ARM  Final   Special Requests   Final    BOTTLES DRAWN AEROBIC AND ANAEROBIC Blood Culture adequate volume Performed at [facility], [address], [place], [place] [address]    Culture PENDING  Incomplete   Report Status PENDING  Incomplete  MRSA PCR Screening     Status: None   Collection Time: 03/24/19  2:10 PM  Result Value Ref Range Status   MRSA by PCR NEGATIVE NEGATIVE Final    Comment:        The GeneXpert MRSA Assay (FDA approved for NASAL specimens only), is one component of a comprehensive MRSA colonization surveillance program. It is not intended to diagnose MRSA infection nor to guide or monitor treatment for MRSA infections. Performed at [facility], [address], [place], [place] [address]   SARS Coronavirus 2 [facility] order, Performed in [facility] hospital lab)     Status: None   Collection Time: 03/24/19  3:24 PM  Result Value Ref Range Status   SARS Coronavirus 2 NEGATIVE NEGATIVE Final    Comment: (NOTE) If result is NEGATIVE SARS-CoV-2 target nucleic acids are NOT DETECTED. The SARS-CoV-2 RNA is  generally detectable in upper and  lower  respiratory specimens during the acute phase of infection. The lowest  concentration of SARS-CoV-2 viral copies this assay can detect is 250  copies / mL. A negative result does not preclude SARS-CoV-2 infection  and should not be used as the sole basis for treatment or other  patient management decisions.  A negative result may occur with  improper specimen collection / handling, submission of specimen other  than nasopharyngeal swab, presence of viral mutation(s) within the  areas targeted by this assay, and inadequate number of viral copies  (<250 copies / mL). A negative result must be combined with clinical  observations, patient history, and epidemiological information. If result is POSITIVE SARS-CoV-2 target nucleic acids are DETECTED. The SARS-CoV-2 RNA is generally detectable in upper and lower  respiratory specimens dur ing the acute phase of infection.  Positive  results are indicative of active infection with SARS-CoV-2.  Clinical  correlation with patient history and other diagnostic information is  necessary to determine patient infection status.  Positive results do  not rule out bacterial infection or co-infection with other viruses. If result is PRESUMPTIVE POSTIVE SARS-CoV-2 nucleic acids MAY BE PRESENT.   A presumptive positive result was obtained on the submitted specimen  and confirmed on repeat testing.  While 2019 novel coronavirus  (SARS-CoV-2) nucleic acids may be present in the submitted sample  additional confirmatory testing may be necessary for epidemiological  and / or clinical management purposes  to differentiate between  SARS-CoV-2 and other Sarbecovirus currently known to infect humans.  If clinically indicated additional testing with an alternate test  methodology [phone]) is advised. The SARS-CoV-2 RNA is generally  detectable in upper and lower respiratory sp ecimens during the acute  phase of infection. The expected result is Negative.  Fact Sheet for Patients:  BoilerBrush.com.cy Fact Sheet for Healthcare Providers: https://pope.com/ This test is not yet approved or cleared by the [place] FDA and has been authorized for detection and/or diagnosis of SARS-CoV-2 by FDA under an Emergency Use Authorization (EUA).  This EUA will remain in effect (meaning this test can be used) for the duration of the COVID-19 declaration under Section 564(b)(1) of the Act, 21 U.S.C. section 360bbb-3(b)(1), unless the authorization is terminated or revoked sooner. Performed at [facility], [address], [place], [place] [address]      Radiological Exams on Admission: Dg Chest Portable 1 View  Result Date: 03/24/2019 CLINICAL DATA:  Fever with recent hospitalization for pneumonia. COPD. EXAM: PORTABLE CHEST 1 VIEW COMPARISON:  03/06/2019, 02/26/2019 and CT 02/28/2019 FINDINGS: Lungs are adequately inflated demonstrate persistent bilateral airspace process over the left upper lung and right mid to lower lung. There is improved aeration over the mid to upper lungs and worsening opacification over the right base. No effusion. Cardiomediastinal silhouette and remainder of the exam is unchanged. IMPRESSION: Persistent multifocal airspace process likely pneumonia improved in the mid to upper lungs are worse in the right base. Electronically Signed   By: Elberta Fortis M.D.   On: 03/24/2019 13:06    EKG: Independently reviewed.  Rhythm with LVH.  No acute ST changes.  Assessment/Plan: Active Problems:   COPD, severe (HCC)   Bipolar disorder (HCC)   Bipolar disorder with severe depression (HCC)   Alcohol use disorder, severe, dependence (HCC)   Essential hypertension   GERD (gastroesophageal reflux disease)   Bronchiectasis (HCC)   Fever    This patient was discussed with the ED physician, including  pertinent vitals, physical exam findings, labs, and imaging.  We also discussed care given by  the ED provider.  1. Fever a. No fever here. b. Will observe overnight, especially given the patient's past history of needing to be intubated 3 times during his hospitalizations. c. Possibly respiratory virus in nature d. Blood cultures obtained. e. Negative for COVID f. Respiratory virus panel obtained 2. Bronchiectasis a. With procalcitonin less than 0.1 and normal white count, this is likely bronchiectasis or viral in nature. b. Start doxycycline c. Mucinex d. Albuterol HFA every 4 hours e. Spiriva f. Incentive spirometry g. Repeat CRP in the morning h. Repeat CBC in the morning 3. COPD (severe) a. Continue Spiriva and albuterol 4. CHF a. Compensated 5. GERD a. Continue home medications 6. Hypertension a. Continue antihypertensives 7. Bipolar. a. Continue home meds 8. Alcohol use disorder a. Withdrawal precautions  DVT prophylaxis: Lovenox Consultants: None Code Status: No code Family Communication: None Disposition Plan: Patient should be able to return home tomorrow   Levie Heritage, DO

## 2019-03-24 NOTE — ED Triage Notes (Signed)
Patient had home health to visit today advised patient to come into ER due to abnormal labs drawn a couple of days ago.  Patient currently afebrile with oral temp 98.5, patient had taken Tylenol 2 hours prior.

## 2019-03-25 DIAGNOSIS — J471 Bronchiectasis with (acute) exacerbation: Secondary | ICD-10-CM | POA: Diagnosis not present

## 2019-03-25 DIAGNOSIS — J449 Chronic obstructive pulmonary disease, unspecified: Secondary | ICD-10-CM | POA: Diagnosis not present

## 2019-03-25 DIAGNOSIS — F1020 Alcohol dependence, uncomplicated: Secondary | ICD-10-CM | POA: Diagnosis not present

## 2019-03-25 DIAGNOSIS — F314 Bipolar disorder, current episode depressed, severe, without psychotic features: Secondary | ICD-10-CM | POA: Diagnosis not present

## 2019-03-25 LAB — RESPIRATORY PANEL BY PCR

## 2019-03-25 LAB — BASIC METABOLIC PANEL
Anion gap: 8 (ref 5–15)
BUN: 7 mg/dL (ref 6–20)
CO2: 26 mmol/L (ref 22–32)
Calcium: 8.3 mg/dL — ABNORMAL LOW (ref 8.9–10.3)
Chloride: 99 mmol/L (ref 98–111)
Creatinine, Ser: 0.55 mg/dL — ABNORMAL LOW (ref 0.61–1.24)
GFR calc Af Amer: >60 mL/min (ref >60)
GFR calc non Af Amer: >60 mL/min (ref >60)
Glucose, Bld: 87 mg/dL (ref 70–99)
Potassium: 4.5 mmol/L (ref 3.5–5.1)
Sodium: 133 mmol/L — ABNORMAL LOW (ref 135–145)

## 2019-03-25 LAB — CBC
HCT: 30.2 % — ABNORMAL LOW (ref 39.0–52.0)
Hemoglobin: 9.5 g/dL — ABNORMAL LOW (ref 13.0–17.0)
MCH: 26.2 pg (ref 26.0–34.0)
MCHC: 31.5 g/dL (ref 30.0–36.0)
MCV: 83.4 fL (ref 80.0–100.0)
Platelets: 321 10*3/uL (ref 150–400)
RBC: 3.62 MIL/uL — ABNORMAL LOW (ref 4.22–5.81)
RDW: 14 % (ref 11.5–15.5)
WBC: 6.6 10*3/uL (ref 4.0–10.5)
nRBC: 0 % (ref 0.0–0.2)

## 2019-03-25 LAB — C-REACTIVE PROTEIN: CRP: 14.8 mg/dL — ABNORMAL HIGH (ref <1.0)

## 2019-03-25 MED ORDER — ALBUTEROL SULFATE (2.5 MG/3ML) 0.083% IN NEBU: 2.5000 mg | INHALATION_SOLUTION | RESPIRATORY_TRACT | Status: DC

## 2019-03-25 MED ORDER — ALBUTEROL SULFATE (2.5 MG/3ML) 0.083% IN NEBU
2.5000 mg | INHALATION_SOLUTION | Freq: Three times a day (TID) | RESPIRATORY_TRACT | Status: DC
  Administered 2019-03-26 – 2019-03-30 (×13): 2.5 mg via RESPIRATORY_TRACT
  Filled 2019-03-25 (×14): qty 3

## 2019-03-25 MED ORDER — POTASSIUM CHLORIDE CRYS ER 20 MEQ PO TBCR
40.0000 meq | EXTENDED_RELEASE_TABLET | Freq: Every day | ORAL | Status: DC
  Filled 2019-03-25 (×5): qty 2

## 2019-03-25 MED ORDER — LORATADINE 10 MG PO TABS
10.0000 mg | ORAL_TABLET | Freq: Every day | ORAL | Status: DC
  Administered 2019-03-25 – 2019-03-29 (×5): 10 mg via ORAL
  Filled 2019-03-25 (×6): qty 1

## 2019-03-25 MED ORDER — FLUTICASONE PROPIONATE 50 MCG/ACT NA SUSP
1.0000 | Freq: Every day | NASAL | Status: DC
  Administered 2019-03-25 – 2019-03-30 (×6): 1 via NASAL
  Filled 2019-03-25 (×3): qty 16

## 2019-03-25 MED ORDER — ENSURE ENLIVE PO LIQD
237.0000 mL | Freq: Two times a day (BID) | ORAL | Status: DC
  Administered 2019-03-25 – 2019-03-27 (×4): 237 mL via ORAL

## 2019-03-25 MED ORDER — IBUPROFEN 400 MG PO TABS
200.0000 mg | ORAL_TABLET | Freq: Once | ORAL | Status: AC
  Administered 2019-03-25: 200 mg via ORAL
  Filled 2019-03-25: qty 1

## 2019-03-25 MED ORDER — ALBUTEROL SULFATE (2.5 MG/3ML) 0.083% IN NEBU
2.5000 mg | INHALATION_SOLUTION | RESPIRATORY_TRACT | Status: DC
  Administered 2019-03-25 (×3): 2.5 mg via RESPIRATORY_TRACT
  Filled 2019-03-25 (×3): qty 3

## 2019-03-25 MED ORDER — POTASSIUM CHLORIDE CRYS ER 20 MEQ PO TBCR
40.0000 meq | EXTENDED_RELEASE_TABLET | Freq: Two times a day (BID) | ORAL | Status: DC
  Administered 2019-03-25: 40 meq via ORAL

## 2019-03-25 MED ORDER — ACETAMINOPHEN 325 MG PO TABS
650.0000 mg | ORAL_TABLET | Freq: Four times a day (QID) | ORAL | Status: DC | PRN
  Administered 2019-03-25 – 2019-03-29 (×9): 650 mg via ORAL
  Filled 2019-03-25 (×9): qty 2

## 2019-03-25 NOTE — Progress Notes (Signed)
Initial Nutrition Assessment  DOCUMENTATION CODES:  Not applicable  INTERVENTION:  Chocolate milk w/ meals  Ensure Enlive po BID, each supplement provides 350 kcal and 20 grams of protein  D/C mvi per pt request (is refusing)  NUTRITION DIAGNOSIS:  Increased nutrient needs related to Reccurent acute illness as evidenced by per pt report of being 20 lbs below ubw.  GOAL:  Patient will meet greater than or equal to 90% of their needs  MONITOR:  PO intake, Supplement acceptance, Diet advancement, Skin, Weight trends, Labs, I & O's  REASON FOR ASSESSMENT:  Malnutrition Screening Tool    ASSESSMENT:  58 y/o male PMHx Severe COPD, CHF, GERD, Bipolar disorder w/ depression, polysubstance abuse. Has had 2 hospitalizations earlier this year, both for PNA. Represents w/  fever and cough. Admitted for further workup and management.    RD operating remotely on weekends. Was able to reach by phone.   Despite his initial MST report, he says his appetite is good and denies any recent changes in his eating behavior. However, he does endorse weight loss and he says this is because "I have been in the hospital for the past 4 months". Hew reports a UBW of 156 lbs. Pts current weight is 138.7 lbs. Looking at chart, his weight history is extremely difficult to trend d/t his HF and large fluids shifts. He appears to be roughly stable at 135-150 lbs.   Pt says he has been sober x6 months and has also been off cigarettes for roughly the same amount of time.   Based on his report, it sounds pts weight loss is d/t recurrent acute illness and heightened energy needs, without necessarily any change in appetite. RD reccommended oral supplements. He was agreeable. He specifically asks for chocolate milk. Feel this is appropriate.  Per chart, pt ate 50% of the only meal documented on since admission.   Labs: CRP 13->14.8, Na: 133, Albumin: 2.4, Alk Phos: 431, elevated lfts.  Meds: PO abx, Depakote, Zyprexa,  Folate, Thiamin, KCL. MVI (refusing)  Recent Labs  Lab 03/24/19 1232 03/25/19 0504  NA 134* 133*  K 4.3 4.5  CL 97* 99  CO2 27 26  BUN 5* 7  CREATININE 0.49* 0.55*  CALCIUM 8.6* 8.3*  GLUCOSE 87 87   NUTRITION - FOCUSED PHYSICAL EXAM: Unable to conduct  Diet Order:   Diet Order            Diet Heart Room service appropriate? Yes; Fluid consistency: Thin  Diet effective now             EDUCATION NEEDS:  No education needs have been identified at this time  Skin:  Skin Assessment: Reviewed RN Assessment  Last BM:  5/2  Height:  Ht Readings from Last 1 Encounters:  03/24/19 5' 10"  (1.778 m)   Weight:  Wt Readings from Last 1 Encounters:  03/24/19 62.9 kg   Wt Readings from Last 10 Encounters:  03/24/19 62.9 kg  03/06/19 60.3 kg  01/12/19 64.4 kg  11/28/18 68 kg  09/16/18 65.3 kg  08/01/18 64.9 kg  07/19/18 64.4 kg  07/01/18 63.6 kg  06/16/18 65.8 kg  06/06/18 64 kg   Ideal Body Weight:  75.45 kg  BMI:  Body mass index is 19.9 kg/m.  Estimated Nutritional Needs:  Kcal:  2000-2200 kcals (32-35 kcal/kg bw) Protein:  88-100g Pro (1.4-1.6 /kg bw) Fluid:  1.9 L/day (30 ml/kg bw)  Burtis Junes RD, LDN, CNSC Clinical Nutrition Available Tues-Sat via Pager: [phone]  03/25/2019 5:28 PM

## 2019-03-25 NOTE — Progress Notes (Signed)
PROGRESS NOTE    Jeffrey [name]  ONG:[id number] DOB: 02-11-1961 DOA: 03/24/2019 PCP: Gareth Morgan, MD     Brief Narrative:  58 y.o. male with a history of severe COPD, chronic diastolic heart failure, GERD, bipolar disorder with depression.  Patient has had 2 hospitalizations: Was hospitalized from 12/16/2020 to 01/13/2019 for Klebsiella pneumonia with acute respiratory failure and intubation.  He was admitted on 3/26 for fevers and cough, reintubated on 3/27 and eventually sent home on 03/07/2019.  He was sent home on 14 days of Levaquin and a steroid taper.  He has completed both of these.  The patient has had a fever over the past 2 days, as high as 103.  He was seen by the visiting home nurse, who sent him to the hospital for evaluation.  Assessment & Plan: 1-fever/bronchiectasis: Presumed to be secondary to bronchiectasis; patient reports productive cough and some shortness of breath at baseline. -Continue as needed antipyretics -Follow blood cultures -Patient is negative for COVID-19 -Continue current antibiotics -Continue bronchodilators -CRP is elevated, suggesting inflammatory response. -Started on flutter valve and incentive spirometry.  2-COPD -Currently no wheezing -Continue current bronchodilator therapy with Spiriva, Breo Ellipta and albuterol  3-chronic diastolic heart failure -Appears to be compensated -Continue to follow daily weights and strict I's and O's -Low-sodium diet has been ordered -Patient educated about compliance with diet, medication and daily weight checks.  4-gastroesophageal reflux disease -Continue PPI  5-essential hypertension -A stable and well-controlled -Continue current antihypertensive regimen.  6-bipolar disorder -Mood is a stable -Denies hallucinations and suicidal ideation -Continue home antidepressant meds.  7-hyperlipidemia -Continue statins.  8-allergy rhinitis/PND -Started on Claritin and Flonase  9-history of alcohol  abuse -Cessation counseling has been provided -No signs of active withdrawal -Continue monitoring on telemetry -Follow CIWA protocol.  DVT prophylaxis: Lovenox Code Status: Full code Family Communication: No family at bedside Disposition Plan: Remains hospitalized, continue treatment with antibiotics, bronchodilators,start use of flutter valve and incentive spirometer.  Continue as needed antipyretics, follow culture results and clinical improvement.  Consultants:   None  Procedures:   See below for x-ray reports  Antimicrobials:  Anti-infectives (From admission, onward)   Start     Dose/Rate Route Frequency Ordered Stop   03/24/19 2200  vancomycin (VANCOCIN) IVPB 750 mg/150 ml premix  Status:  Discontinued     750 mg 150 mL/hr over 60 Minutes Intravenous Every 8 hours 03/24/19 1352 03/24/19 2039   03/24/19 2100  doxycycline (VIBRA-TABS) tablet 100 mg     100 mg Oral 2 times daily 03/24/19 2039     03/24/19 1400  aztreonam (AZACTAM) 2 g in sodium chloride 0.9 % 100 mL IVPB     2 g 200 mL/hr over 30 Minutes Intravenous  Once 03/24/19 1347 03/24/19 1445   03/24/19 1400  vancomycin (VANCOCIN) 1,250 mg in sodium chloride 0.9 % 250 mL IVPB     1,250 mg 166.7 mL/hr over 90 Minutes Intravenous  Once 03/24/19 1349 03/24/19 1657       Subjective: Patient is spiked fever overnight, reports mild shortness of breath, productive cough, nasal congestion and postnasal drip.  Objective: Vitals:   03/25/19 0005 03/25/19 0345 03/25/19 0600 03/25/19 0723  BP: 107/66  115/74   Pulse: 88  84   Resp: 18  18   Temp: 98.2 F (36.8 C)  98 F (36.7 C)   TempSrc: Oral  Oral   SpO2: 96% 96% 96% 92%  Weight:      Height:  Intake/Output Summary (Last 24 hours) at 03/25/2019 1044 Last data filed at 03/25/2019 0900 Gross per 24 hour  Intake 480 ml  Output 1050 ml  Net -570 ml   Filed Weights   03/24/19 1112 03/24/19 2032  Weight: 63 kg 62.9 kg    Examination: General exam:  Alert, awake, oriented x 3; complaining of nasal congestion and postnasal drip.  Wearing 2 L nasal cannula supplementation; having some shortness of breath and productive cough.  Patient had fever overnight.  Mild shortness of breath has been described. Respiratory system: Mild tachypnea on exertion, positive diffuse rhonchi, very little expiratory wheezing, no using accessory muscles. Cardiovascular system:RRR. No murmurs, rubs, gallops. Gastrointestinal system: Abdomen is nondistended, soft and nontender. No organomegaly or masses felt. Normal bowel sounds heard. Central nervous system: Alert and oriented. No focal neurological deficits. Extremities: No C/C/E, +pedal pulses Skin: No rashes, lesions or ulcers Psychiatry: Judgement and insight appear normal. Mood & affect appropriate.     Data Reviewed: I have personally reviewed following labs and imaging studies  CBC: Recent Labs  Lab 03/24/19 1232 03/25/19 0504  WBC 7.5 6.6  NEUTROABS 5.4  --   HGB 9.6* 9.5*  HCT 31.1* 30.2*  MCV 85.0 83.4  PLT 289 321   Basic Metabolic Panel: Recent Labs  Lab 03/24/19 1232 03/25/19 0504  NA 134* 133*  K 4.3 4.5  CL 97* 99  CO2 27 26  GLUCOSE 87 87  BUN 5* 7  CREATININE 0.49* 0.55*  CALCIUM 8.6* 8.3*   GFR: Estimated Creatinine Clearance: 89.5 mL/min (A) (by C-G formula based on SCr of 0.55 mg/dL (L)).   Liver Function Tests: Recent Labs  Lab 03/24/19 1232  AST 45*  ALT 78*  ALKPHOS 431*  BILITOT 0.3  PROT 6.1*  ALBUMIN 2.4*   Cardiac Enzymes: Recent Labs  Lab 03/24/19 1232  TROPONINI <0.03   Urine analysis:    Component Value Date/Time   COLORURINE YELLOW 03/24/2019 1215   APPEARANCEUR CLEAR 03/24/2019 1215   LABSPEC 1.008 03/24/2019 1215   PHURINE 7.0 03/24/2019 1215   GLUCOSEU NEGATIVE 03/24/2019 1215   HGBUR NEGATIVE 03/24/2019 1215   BILIRUBINUR NEGATIVE 03/24/2019 1215   KETONESUR NEGATIVE 03/24/2019 1215   PROTEINUR NEGATIVE 03/24/2019 1215    UROBILINOGEN 0.2 03/01/2011 1427   NITRITE NEGATIVE 03/24/2019 1215   LEUKOCYTESUR NEGATIVE 03/24/2019 1215    Recent Results (from the past 240 hour(s))  Culture, blood (routine x 2)     Status: None (Preliminary result)   Collection Time: 03/24/19 12:40 PM  Result Value Ref Range Status   Specimen Description RIGHT ANTECUBITAL  Final   Special Requests   Final    BOTTLES DRAWN AEROBIC AND ANAEROBIC Blood Culture adequate volume   Culture   Final    NO GROWTH < 24 HOURS Performed at [facility], [address], [place], [place] [address]    Report Status PENDING  Incomplete  Culture, blood (routine x 2)     Status: None (Preliminary result)   Collection Time: 03/24/19 12:44 PM  Result Value Ref Range Status   Specimen Description BLOOD RIGHT ARM  Final   Special Requests   Final    BOTTLES DRAWN AEROBIC AND ANAEROBIC Blood Culture adequate volume   Culture   Final    NO GROWTH < 24 HOURS Performed at [facility], [address] [place], [place] [address]    Report Status PENDING  Incomplete  MRSA PCR Screening     Status: None  Collection Time: 03/24/19  2:10 PM  Result Value Ref Range Status   MRSA by PCR NEGATIVE NEGATIVE Final    Comment:        The GeneXpert MRSA Assay (FDA approved for NASAL specimens only), is one component of a comprehensive MRSA colonization surveillance program. It is not intended to diagnose MRSA infection nor to guide or monitor treatment for MRSA infections. Performed at [facility], [address] [place], [place] [address]   SARS Coronavirus 2 [facility](Hospital order, Performed in [facility] lab)     Status: None   Collection Time: 03/24/19  3:24 PM  Result Value Ref Range Status   SARS Coronavirus 2 NEGATIVE NEGATIVE Final    Comment: (NOTE) If result is NEGATIVE SARS-CoV-2 target nucleic acids are NOT DETECTED. The SARS-CoV-2 RNA is generally detectable in upper and lower  respiratory specimens during the acute phase of  infection. The lowest  concentration of SARS-CoV-2 viral copies this assay can detect is 250  copies / mL. A negative result does not preclude SARS-CoV-2 infection  and should not be used as the sole basis for treatment or other  patient management decisions.  A negative result may occur with  improper specimen collection / handling, submission of specimen other  than nasopharyngeal swab, presence of viral mutation(s) within the  areas targeted by this assay, and inadequate number of viral copies  (<250 copies / mL). A negative result must be combined with clinical  observations, patient history, and epidemiological information. If result is POSITIVE SARS-CoV-2 target nucleic acids are DETECTED. The SARS-CoV-2 RNA is generally detectable in upper and lower  respiratory specimens dur ing the acute phase of infection.  Positive  results are indicative of active infection with SARS-CoV-2.  Clinical  correlation with patient history and other diagnostic information is  necessary to determine patient infection status.  Positive results do  not rule out bacterial infection or co-infection with other viruses. If result is PRESUMPTIVE POSTIVE SARS-CoV-2 nucleic acids MAY BE PRESENT.   A presumptive positive result was obtained on the submitted specimen  and confirmed on repeat testing.  While 2019 novel coronavirus  (SARS-CoV-2) nucleic acids may be present in the submitted sample  additional confirmatory testing may be necessary for epidemiological  and / or clinical management purposes  to differentiate between  SARS-CoV-2 and other Sarbecovirus currently known to infect humans.  If clinically indicated additional testing with an alternate test  methodology [phone]([id number]) is advised. The SARS-CoV-2 RNA is generally  detectable in upper and lower respiratory sp ecimens during the acute  phase of infection. The expected result is Negative. Fact Sheet for Patients:   BoilerBrush.com.cyhttps://www.fda.gov/media/136312/download Fact Sheet for Healthcare Providers: https://pope.com/https://www.fda.gov/media/136313/download This test is not yet approved or cleared by the [place] FDA and has been authorized for detection and/or diagnosis of SARS-CoV-2 by FDA under an Emergency Use Authorization (EUA).  This EUA will remain in effect (meaning this test can be used) for the duration of the COVID-19 declaration under Section 564(b)(1) of the Act, 21 U.S.C. section 360bbb-3(b)(1), unless the authorization is terminated or revoked sooner. Performed at [facility], [address] [place], [place] [address]     Radiology Studies: Dg Chest Portable 1 View  Result Date: 03/24/2019 CLINICAL DATA:  Fever with recent hospitalization for pneumonia. COPD. EXAM: PORTABLE CHEST 1 VIEW COMPARISON:  03/06/2019, 02/26/2019 and CT 02/28/2019 FINDINGS: Lungs are adequately inflated demonstrate persistent bilateral airspace process over the left upper lung and right mid to lower  lung. There is improved aeration over the mid to upper lungs and worsening opacification over the right base. No effusion. Cardiomediastinal silhouette and remainder of the exam is unchanged. IMPRESSION: Persistent multifocal airspace process likely pneumonia improved in the mid to upper lungs are worse in the right base. Electronically Signed   By: Elberta Fortis M.D.   On: 03/24/2019 13:06   Scheduled Meds: . albuterol  2 puff Inhalation Q4H WA  . aspirin EC  81 mg Oral QPM  . atorvastatin  20 mg Oral q1800  . diltiazem  180 mg Oral Daily  . divalproex  1,500 mg Oral QHS  . doxycycline  100 mg Oral BID  . enoxaparin (LOVENOX) injection  40 mg Subcutaneous Q24H  . FLUoxetine  40 mg Oral Daily  . fluticasone  1 spray Each Nare Daily  . folic acid  1 mg Oral Daily  . gabapentin  400 mg Oral Daily   And  . gabapentin  800 mg Oral QHS  . guaiFENesin  600 mg Oral BID  . loratadine  10 mg Oral Daily  . methocarbamol  500 mg Oral  TID  . metoprolol tartrate  25 mg Oral BID  . multivitamin with minerals  1 tablet Oral Daily  . OLANZapine  10 mg Oral QHS  . [START ON 03/26/2019] potassium chloride SA  40 mEq Oral Daily  . thiamine  250 mg Oral Daily  . traZODone  50 mg Oral QHS  . umeclidinium bromide  1 puff Inhalation Daily   Continuous Infusions:   LOS: 0 days    Time spent: 30 minutes    Vassie Loll, MD [facility] Pager [phone]  03/25/2019, 10:44 AM

## 2019-03-26 ENCOUNTER — Observation Stay (HOSPITAL_COMMUNITY): Payer: Medicare Other

## 2019-03-26 DIAGNOSIS — G4700 Insomnia, unspecified: Secondary | ICD-10-CM | POA: Diagnosis present

## 2019-03-26 DIAGNOSIS — Z79899 Other long term (current) drug therapy: Secondary | ICD-10-CM | POA: Diagnosis not present

## 2019-03-26 DIAGNOSIS — J189 Pneumonia, unspecified organism: Secondary | ICD-10-CM

## 2019-03-26 DIAGNOSIS — J90 Pleural effusion, not elsewhere classified: Secondary | ICD-10-CM | POA: Diagnosis present

## 2019-03-26 DIAGNOSIS — Z87891 Personal history of nicotine dependence: Secondary | ICD-10-CM | POA: Diagnosis not present

## 2019-03-26 DIAGNOSIS — J471 Bronchiectasis with (acute) exacerbation: Secondary | ICD-10-CM | POA: Diagnosis not present

## 2019-03-26 DIAGNOSIS — F3130 Bipolar disorder, current episode depressed, mild or moderate severity, unspecified: Secondary | ICD-10-CM | POA: Diagnosis not present

## 2019-03-26 DIAGNOSIS — I110 Hypertensive heart disease with heart failure: Secondary | ICD-10-CM | POA: Diagnosis present

## 2019-03-26 DIAGNOSIS — F1220 Cannabis dependence, uncomplicated: Secondary | ICD-10-CM | POA: Diagnosis present

## 2019-03-26 DIAGNOSIS — F1020 Alcohol dependence, uncomplicated: Secondary | ICD-10-CM | POA: Diagnosis present

## 2019-03-26 DIAGNOSIS — I739 Peripheral vascular disease, unspecified: Secondary | ICD-10-CM | POA: Diagnosis present

## 2019-03-26 DIAGNOSIS — Z20828 Contact with and (suspected) exposure to other viral communicable diseases: Secondary | ICD-10-CM | POA: Diagnosis present

## 2019-03-26 DIAGNOSIS — K219 Gastro-esophageal reflux disease without esophagitis: Secondary | ICD-10-CM | POA: Diagnosis present

## 2019-03-26 DIAGNOSIS — J9611 Chronic respiratory failure with hypoxia: Secondary | ICD-10-CM | POA: Diagnosis present

## 2019-03-26 DIAGNOSIS — Z8249 Family history of ischemic heart disease and other diseases of the circulatory system: Secondary | ICD-10-CM | POA: Diagnosis not present

## 2019-03-26 DIAGNOSIS — Z8711 Personal history of peptic ulcer disease: Secondary | ICD-10-CM | POA: Diagnosis not present

## 2019-03-26 DIAGNOSIS — I5032 Chronic diastolic (congestive) heart failure: Secondary | ICD-10-CM | POA: Diagnosis present

## 2019-03-26 DIAGNOSIS — F419 Anxiety disorder, unspecified: Secondary | ICD-10-CM | POA: Diagnosis present

## 2019-03-26 DIAGNOSIS — J310 Chronic rhinitis: Secondary | ICD-10-CM | POA: Diagnosis present

## 2019-03-26 DIAGNOSIS — Z89412 Acquired absence of left great toe: Secondary | ICD-10-CM | POA: Diagnosis not present

## 2019-03-26 DIAGNOSIS — Z818 Family history of other mental and behavioral disorders: Secondary | ICD-10-CM | POA: Diagnosis not present

## 2019-03-26 DIAGNOSIS — F314 Bipolar disorder, current episode depressed, severe, without psychotic features: Secondary | ICD-10-CM | POA: Diagnosis not present

## 2019-03-26 DIAGNOSIS — Z881 Allergy status to other antibiotic agents status: Secondary | ICD-10-CM | POA: Diagnosis not present

## 2019-03-26 DIAGNOSIS — Z888 Allergy status to other drugs, medicaments and biological substances status: Secondary | ICD-10-CM | POA: Diagnosis not present

## 2019-03-26 DIAGNOSIS — J479 Bronchiectasis, uncomplicated: Secondary | ICD-10-CM | POA: Diagnosis present

## 2019-03-26 DIAGNOSIS — E785 Hyperlipidemia, unspecified: Secondary | ICD-10-CM | POA: Diagnosis present

## 2019-03-26 DIAGNOSIS — Y95 Nosocomial condition: Secondary | ICD-10-CM

## 2019-03-26 DIAGNOSIS — Z7982 Long term (current) use of aspirin: Secondary | ICD-10-CM | POA: Diagnosis not present

## 2019-03-26 DIAGNOSIS — J449 Chronic obstructive pulmonary disease, unspecified: Secondary | ICD-10-CM | POA: Diagnosis not present

## 2019-03-26 DIAGNOSIS — Z79891 Long term (current) use of opiate analgesic: Secondary | ICD-10-CM | POA: Diagnosis not present

## 2019-03-26 LAB — BASIC METABOLIC PANEL
Anion gap: 10 (ref 5–15)
BUN: 7 mg/dL (ref 6–20)
CO2: 25 mmol/L (ref 22–32)
Calcium: 8.3 mg/dL — ABNORMAL LOW (ref 8.9–10.3)
Chloride: 98 mmol/L (ref 98–111)
Creatinine, Ser: 0.55 mg/dL — ABNORMAL LOW (ref 0.61–1.24)
GFR calc Af Amer: >60 mL/min (ref >60)
GFR calc non Af Amer: >60 mL/min (ref >60)
Glucose, Bld: 144 mg/dL — ABNORMAL HIGH (ref 70–99)
Potassium: 4.1 mmol/L (ref 3.5–5.1)
Sodium: 133 mmol/L — ABNORMAL LOW (ref 135–145)

## 2019-03-26 MED ORDER — IOHEXOL 300 MG/ML  SOLN
75.0000 mL | Freq: Once | INTRAMUSCULAR | Status: AC | PRN
  Administered 2019-03-26: 75 mL via INTRAVENOUS

## 2019-03-26 MED ORDER — METRONIDAZOLE IN NACL 5-0.79 MG/ML-% IV SOLN
500.0000 mg | Freq: Three times a day (TID) | INTRAVENOUS | Status: DC
  Administered 2019-03-26 – 2019-03-30 (×12): 500 mg via INTRAVENOUS
  Filled 2019-03-26 (×12): qty 100

## 2019-03-26 MED ORDER — SODIUM CHLORIDE 0.9 % IV SOLN
2.0000 g | Freq: Three times a day (TID) | INTRAVENOUS | Status: DC
  Administered 2019-03-26 – 2019-03-30 (×11): 2 g via INTRAVENOUS
  Filled 2019-03-26 (×11): qty 2

## 2019-03-26 NOTE — Progress Notes (Signed)
PROGRESS NOTE    Jeffrey [name]  ZOX:[id number] DOB: [birth year] DOA: 03/24/2019 PCP: Gareth MorganKnowlton, Steve, MD     Brief Narrative:  58 y.o. male with a history of severe COPD, chronic diastolic heart failure, GERD, bipolar disorder with depression.  Patient has had 2 hospitalizations: Was hospitalized from 12/16/2020 to 01/13/2019 for Klebsiella pneumonia with acute respiratory failure and intubation.  He was admitted on 3/26 for fevers and cough, reintubated on 3/27 and eventually sent home on 03/07/2019.  He was sent home on 14 days of Levaquin and a steroid taper.  He has completed both of these.  The patient has had a fever over the past 2 days, as high as 103.  He was seen by the visiting home nurse, who sent him to the hospital for evaluation.  Assessment & Plan: 1-fever/bronchiectasis: Presumed to be secondary to bronchiectasis; patient reports productive cough and some shortness of breath at baseline. -Continue as needed antipyretics -Follow blood cultures -Patient is negative for COVID-19 -Given ongoing fever will check CT chest to rule out empyema; continue antibiotics but blood in for better coverage using cefepime and Flagyl.  No vancomycin given negative MRSA by PCR. -Continue bronchodilators -CRP is elevated, suggesting inflammatory response. -continue flutter valve and incentive spirometry. -There is no other source of infection identified on physical exam.  No meningeal symptoms.  2-COPD/chronic respiratory failure -Currently no wheezing -Continue current bronchodilator therapy with Spiriva, Breo Ellipta and albuterol. -Continue oxygen supplementation.  3-chronic diastolic heart failure -Appears to be compensated -Continue to follow daily weights and strict I's and O's -Low-sodium diet has been ordered -Patient educated about compliance with diet, medication and daily weight checks.  4-gastroesophageal reflux disease -Continue PPI  5-essential hypertension -A stable and  well-controlled -Continue current antihypertensive regimen.  6-bipolar disorder -Mood is a stable -Denies hallucinations and suicidal ideation -Continue home antidepressant meds.  7-hyperlipidemia -Continue statins.  8-allergy rhinitis/PND -Continue treatment with Claritin and Flonase  9-history of alcohol abuse -Cessation counseling has been provided -No signs of active withdrawal -Continue monitoring on telemetry -Follow CIWA protocol.  DVT prophylaxis: Lovenox Code Status: Full code Family Communication: No family at bedside Disposition Plan: Remains hospitalized, will check CT chest; continue antibiotics, but will broaden to cefepime and Flagyl (covering for Pseudomonas, H CAP and aspiration).  Case discussed with ID.  No complaints to suggest meningitis or any other source of infection currently.    Consultants:   ID (Dr. Luciana Axeomer); check CT chest, broaden antibiotics to cefepime and Flagyl.  Procedures:   See below for x-ray reports  Antimicrobials:  Anti-infectives (From admission, onward)   Start     Dose/Rate Route Frequency Ordered Stop   03/26/19 0900  ceFEPIme (MAXIPIME) 2 g in sodium chloride 0.9 % 100 mL IVPB     2 g 200 mL/hr over 30 Minutes Intravenous Every 8 hours 03/26/19 0848     03/26/19 0845  metroNIDAZOLE (FLAGYL) IVPB 500 mg     500 mg 100 mL/hr over 60 Minutes Intravenous Every 8 hours 03/26/19 0848     03/24/19 2200  vancomycin (VANCOCIN) IVPB 750 mg/150 ml premix  Status:  Discontinued     750 mg 150 mL/hr over 60 Minutes Intravenous Every 8 hours 03/24/19 1352 03/24/19 2039   03/24/19 2100  doxycycline (VIBRA-TABS) tablet 100 mg  Status:  Discontinued     100 mg Oral 2 times daily 03/24/19 2039 03/26/19 0848   03/24/19 1400  aztreonam (AZACTAM) 2 g in sodium chloride 0.9 % 100  mL IVPB     2 g 200 mL/hr over 30 Minutes Intravenous  Once 03/24/19 1347 03/24/19 1445   03/24/19 1400  vancomycin (VANCOCIN) 1,250 mg in sodium chloride 0.9 % 250 mL  IVPB     1,250 mg 166.7 mL/hr over 90 Minutes Intravenous  Once 03/24/19 1349 03/24/19 1657       Subjective: No chest pain, no nausea, no vomiting.  Patient continue spiking fever and complaining of productive cough.  Objective: Vitals:   03/25/19 2109 03/26/19 0517 03/26/19 0646 03/26/19 0733  BP: 109/65 110/60    Pulse: 91 (!) 103    Resp:      Temp: 98.1 F (36.7 C) (!) 102.3 F (39.1 C) 100.1 F (37.8 C)   TempSrc: Oral Oral Oral   SpO2: 90% 96%  96%  Weight:      Height:        Intake/Output Summary (Last 24 hours) at 03/26/2019 1014 Last data filed at 03/25/2019 1700 Gross per 24 hour  Intake 600 ml  Output 850 ml  Net -250 ml   Filed Weights   03/24/19 1112 03/24/19 2032  Weight: 63 kg 62.9 kg    Examination: General exam: Alert, awake, oriented x 3; denies chest pain, no nausea, no vomiting.  Currently without withdrawal symptoms.  Has continued spiking fevers and reports productive cough. Respiratory system: No crackles, positive rhonchi right, no using accessory muscles.  Continue avoiding 3 L nasal cannula oxygen supplementation. Cardiovascular system:RRR. No murmurs, rubs, gallops. Gastrointestinal system: Abdomen is nondistended, soft and nontender. No organomegaly or masses felt. Normal bowel sounds heard. Central nervous system: Alert and oriented. No focal neurological deficits. Extremities: No C/C/E, +pedal pulses Skin: No rashes, lesions or ulcers Psychiatry: Judgement and insight appear normal. Mood & affect appropriate.   Data Reviewed: I have personally reviewed following labs and imaging studies  CBC: Recent Labs  Lab 03/24/19 1232 03/25/19 0504  WBC 7.5 6.6  NEUTROABS 5.4  --   HGB 9.6* 9.5*  HCT 31.1* 30.2*  MCV 85.0 83.4  PLT 289 321   Basic Metabolic Panel: Recent Labs  Lab 03/24/19 1232 03/25/19 0504 03/26/19 0615  NA 134* 133* 133*  K 4.3 4.5 4.1  CL 97* 99 98  CO2 GLUCOSE 87 87 144*  BUN 5* 7 7  CREATININE  0.49* 0.55* 0.55*  CALCIUM 8.6* 8.3* 8.3*   GFR: Estimated Creatinine Clearance: 89.5 mL/min (A) (by C-G formula based on SCr of 0.55 mg/dL (L)).   Liver Function Tests: Recent Labs  Lab 03/24/19 1232  AST 45*  ALT 78*  ALKPHOS 431*  BILITOT 0.3  PROT 6.1*  ALBUMIN 2.4*   Cardiac Enzymes: Recent Labs  Lab 03/24/19 1232  TROPONINI <0.03   Urine analysis:    Component Value Date/Time   COLORURINE YELLOW 03/24/2019 1215   APPEARANCEUR CLEAR 03/24/2019 1215   LABSPEC 1.008 03/24/2019 1215   PHURINE 7.0 03/24/2019 1215   GLUCOSEU NEGATIVE 03/24/2019 1215   HGBUR NEGATIVE 03/24/2019 1215   BILIRUBINUR NEGATIVE 03/24/2019 1215   KETONESUR NEGATIVE 03/24/2019 1215   PROTEINUR NEGATIVE 03/24/2019 1215   UROBILINOGEN 0.2 03/01/2011 1427   NITRITE NEGATIVE 03/24/2019 1215   LEUKOCYTESUR NEGATIVE 03/24/2019 1215    Recent Results (from the past 240 hour(s))  Culture, blood (routine x 2)     Status: None (Preliminary result)   Collection Time: 03/24/19 12:40 PM  Result Value Ref Range Status   Specimen Description RIGHT ANTECUBITAL  Final  Special Requests   Final    BOTTLES DRAWN AEROBIC AND ANAEROBIC Blood Culture adequate volume   Culture   Final    NO GROWTH 2 DAYS Performed at [facility], [address] [place], [place] [address]    Report Status PENDING  Incomplete  Culture, blood (routine x 2)     Status: None (Preliminary result)   Collection Time: 03/24/19 12:44 PM  Result Value Ref Range Status   Specimen Description BLOOD RIGHT ARM  Final   Special Requests   Final    BOTTLES DRAWN AEROBIC AND ANAEROBIC Blood Culture adequate volume   Culture   Final    NO GROWTH 2 DAYS Performed at [facility], [address], [place], [place] [address]    Report Status PENDING  Incomplete  MRSA PCR Screening     Status: None   Collection Time: 03/24/19  2:10 PM  Result Value Ref Range Status   MRSA by PCR NEGATIVE NEGATIVE Final    Comment:        The GeneXpert  MRSA Assay (FDA approved for NASAL specimens only), is one component of a comprehensive MRSA colonization surveillance program. It is not intended to diagnose MRSA infection nor to guide or monitor treatment for MRSA infections. Performed at [facility], [address], [place], [place] [address]   SARS Coronavirus 2 [facility] order, Performed in [facility] hospital lab)     Status: None   Collection Time: 03/24/19  3:24 PM  Result Value Ref Range Status   SARS Coronavirus 2 NEGATIVE NEGATIVE Final    Comment: (NOTE) If result is NEGATIVE SARS-CoV-2 target nucleic acids are NOT DETECTED. The SARS-CoV-2 RNA is generally detectable in upper and lower  respiratory specimens during the acute phase of infection. The lowest  concentration of SARS-CoV-2 viral copies this assay can detect is 250  copies / mL. A negative result does not preclude SARS-CoV-2 infection  and should not be used as the sole basis for treatment or other  patient management decisions.  A negative result may occur with  improper specimen collection / handling, submission of specimen other  than nasopharyngeal swab, presence of viral mutation(s) within the  areas targeted by this assay, and inadequate number of viral copies  (<250 copies / mL). A negative result must be combined with clinical  observations, patient history, and epidemiological information. If result is POSITIVE SARS-CoV-2 target nucleic acids are DETECTED. The SARS-CoV-2 RNA is generally detectable in upper and lower  respiratory specimens dur ing the acute phase of infection.  Positive  results are indicative of active infection with SARS-CoV-2.  Clinical  correlation with patient history and other diagnostic information is  necessary to determine patient infection status.  Positive results do  not rule out bacterial infection or co-infection with other viruses. If result is PRESUMPTIVE POSTIVE SARS-CoV-2 nucleic acids MAY BE PRESENT.   A  presumptive positive result was obtained on the submitted specimen  and confirmed on repeat testing.  While 2019 novel coronavirus  (SARS-CoV-2) nucleic acids may be present in the submitted sample  additional confirmatory testing may be necessary for epidemiological  and / or clinical management purposes  to differentiate between  SARS-CoV-2 and other Sarbecovirus currently known to infect humans.  If clinically indicated additional testing with an alternate test  methodology [phone]) is advised. The SARS-CoV-2 RNA is generally  detectable in upper and lower respiratory sp ecimens during the acute  phase of infection. The expected result is Negative. Fact  Sheet for Patients:  BoilerBrush.com.cy Fact Sheet for Healthcare Providers: https://pope.com/ This test is not yet approved or cleared by the [place] FDA and has been authorized for detection and/or diagnosis of SARS-CoV-2 by FDA under an Emergency Use Authorization (EUA).  This EUA will remain in effect (meaning this test can be used) for the duration of the COVID-19 declaration under Section 564(b)(1) of the Act, 21 U.S.C. section 360bbb-3(b)(1), unless the authorization is terminated or revoked sooner. Performed at [facility], [address], [place], [place] [address]   Respiratory Panel by PCR     Status: None   Collection Time: 03/24/19  4:05 PM  Result Value Ref Range Status   Adenovirus NOT DETECTED NOT DETECTED Final   Coronavirus 229E NOT DETECTED NOT DETECTED Final    Comment: (NOTE) The Coronavirus on the Respiratory Panel, DOES NOT test for the novel  Coronavirus (2019 nCoV)    Coronavirus HKU1 NOT DETECTED NOT DETECTED Final   Coronavirus NL63 NOT DETECTED NOT DETECTED Final   Coronavirus OC43 NOT DETECTED NOT DETECTED Final   Metapneumovirus NOT DETECTED NOT DETECTED Final   Rhinovirus / Enterovirus NOT DETECTED NOT DETECTED Final   Influenza A NOT DETECTED  NOT DETECTED Final   Influenza B NOT DETECTED NOT DETECTED Final   Parainfluenza Virus 1 NOT DETECTED NOT DETECTED Final   Parainfluenza Virus 2 NOT DETECTED NOT DETECTED Final   Parainfluenza Virus 3 NOT DETECTED NOT DETECTED Final   Parainfluenza Virus 4 NOT DETECTED NOT DETECTED Final   Respiratory Syncytial Virus NOT DETECTED NOT DETECTED Final   Bordetella pertussis NOT DETECTED NOT DETECTED Final   Chlamydophila pneumoniae NOT DETECTED NOT DETECTED Final   Mycoplasma pneumoniae NOT DETECTED NOT DETECTED Final    Comment: Performed at [facility], [address], [place], [place] [address]    Radiology Studies: Dg Chest Portable 1 View  Result Date: 03/24/2019 CLINICAL DATA:  Fever with recent hospitalization for pneumonia. COPD. EXAM: PORTABLE CHEST 1 VIEW COMPARISON:  03/06/2019, 02/26/2019 and CT 02/28/2019 FINDINGS: Lungs are adequately inflated demonstrate persistent bilateral airspace process over the left upper lung and right mid to lower lung. There is improved aeration over the mid to upper lungs and worsening opacification over the right base. No effusion. Cardiomediastinal silhouette and remainder of the exam is unchanged. IMPRESSION: Persistent multifocal airspace process likely pneumonia improved in the mid to upper lungs are worse in the right base. Electronically Signed   By: Elberta Fortis M.D.   On: 03/24/2019 13:06   Scheduled Meds: . albuterol  2.5 mg Nebulization TID  . aspirin EC  81 mg Oral QPM  . atorvastatin  20 mg Oral q1800  . diltiazem  180 mg Oral Daily  . divalproex  1,500 mg Oral QHS  . enoxaparin (LOVENOX) injection  40 mg Subcutaneous Q24H  . feeding supplement (ENSURE ENLIVE)  237 mL Oral BID BM  . FLUoxetine  40 mg Oral Daily  . fluticasone  1 spray Each Nare Daily  . folic acid  1 mg Oral Daily  . gabapentin  400 mg Oral Daily   And  . gabapentin  800 mg Oral QHS  . guaiFENesin  600 mg Oral BID  . loratadine  10 mg Oral Daily  .  methocarbamol  500 mg Oral TID  . metoprolol tartrate  25 mg Oral BID  . OLANZapine  10 mg Oral QHS  . potassium chloride SA  40 mEq Oral Daily  . thiamine  250 mg Oral Daily  . traZODone  50 mg Oral QHS  . umeclidinium bromide  1 puff Inhalation Daily   Continuous Infusions: . ceFEPime (MAXIPIME) IV    . metronidazole       LOS: 0 days    Time spent: 30 minutes    Vassie Loll, MD [facility] Pager [phone]  03/26/2019, 10:14 AM

## 2019-03-27 ENCOUNTER — Inpatient Hospital Stay (HOSPITAL_COMMUNITY): Payer: Medicare Other

## 2019-03-27 DIAGNOSIS — F3130 Bipolar disorder, current episode depressed, mild or moderate severity, unspecified: Secondary | ICD-10-CM

## 2019-03-27 DIAGNOSIS — J9611 Chronic respiratory failure with hypoxia: Secondary | ICD-10-CM

## 2019-03-27 LAB — GRAM STAIN

## 2019-03-27 LAB — BODY FLUID CELL COUNT WITH DIFFERENTIAL
Eos, Fluid: 6 %
Lymphs, Fluid: 49 %
Monocyte-Macrophage-Serous Fluid: 12 % — ABNORMAL LOW (ref 50–90)
Neutrophil Count, Fluid: 33 % — ABNORMAL HIGH (ref 0–25)
Total Nucleated Cell Count, Fluid: 1686 cu mm — ABNORMAL HIGH (ref 0–1000)

## 2019-03-27 LAB — GLUCOSE, PLEURAL OR PERITONEAL FLUID: Glucose, Fluid: 124 mg/dL

## 2019-03-27 LAB — LACTATE DEHYDROGENASE, PLEURAL OR PERITONEAL FLUID: LD, Fluid: 122 U/L — ABNORMAL HIGH (ref 3–23)

## 2019-03-27 NOTE — Progress Notes (Signed)
PROGRESS NOTE    Jeffrey [name]  MHW:[id number] DOB: 24-Dec-1960 DOA: 03/24/2019 PCP: Gareth Morgan, MD     Brief Narrative:  58 y.o. male with a history of severe COPD, chronic diastolic heart failure, GERD, bipolar disorder with depression.  Patient has had 2 hospitalizations: Was hospitalized from 12/16/2020 to 01/13/2019 for Klebsiella pneumonia with acute respiratory failure and intubation.  He was admitted on 3/26 for fevers and cough, reintubated on 3/27 and eventually sent home on 03/07/2019.  He was sent home on 14 days of Levaquin and a steroid taper.  He has completed both of these.  The patient has had a fever over the past 2 days, as high as 103.  He was seen by the visiting home nurse, who sent him to the hospital for evaluation.  Assessment & Plan: 1-fever/bronchiectasis: Presumed to be secondary to bronchiectasis; patient reports productive cough and some shortness of breath at baseline. -Continue as needed antipyretics -Follow blood cultures -Patient is negative for COVID-19 -Given ongoing fever CT chest was done to rule out empyema; images demonstrated complex effusion collection on his left and right lungs (2 on the left one on the right); radiology has been consulted for diagnostic thoracentesis.   -Continue IV antibiotics (using cefepime and Flagyl). -Continue bronchodilators -CRP is elevated, suggesting inflammatory response. -continue flutter valve and incentive spirometry. -There is no other source of infection identified on physical exam.  No meningeal symptoms.  2-COPD/chronic respiratory failure -Currently no wheezing -Continue current bronchodilator therapy with Spiriva, Breo Ellipta and albuterol. -Continue oxygen supplementation. -Patient reported some difficulty speaking in full sentences due to shortness of breath, intermittently.  3-chronic diastolic heart failure -Appears to be compensated -Continue to follow daily weights and strict I's and  O's -Low-sodium diet has been ordered -Patient educated about compliance with diet, medication and daily weight checks.  4-gastroesophageal reflux disease -Continue PPI  5-essential hypertension -A stable and well-controlled -Continue current antihypertensive regimen.  6-bipolar disorder -Mood is a stable -Denies hallucinations and suicidal ideation -Continue home antidepressant meds.  7-hyperlipidemia -Continue statins.  8-allergy rhinitis/PND -Continue treatment with Claritin and Flonase  9-history of alcohol abuse -Cessation counseling has been provided -No signs of active withdrawal -Continue monitoring on telemetry -Follow CIWA protocol.  10-insomnia -Will use low-dose of as needed trazodone.  DVT prophylaxis: Lovenox Code Status: Full code Family Communication: No family at bedside Disposition Plan: Remains hospitalized, CT chest demonstrated some complex effusion collection 2 on the left and 1 on the right; along with worsening right lower lobe infiltrate. Will continue current antibiotics, cefepime and Flagyl (covering for Pseudomonas, HCAP and aspiration).  Case discussed with ID.  No complaints to suggest meningitis or any other source of infection currently.  Radiology service contacted for diagnostic thoracentesis.  Consultants:   ID (Dr. Luciana Axe); check CT chest, broaden antibiotics to cefepime and Flagyl.  Radiology (consulted for diagnostic thoracentesis).  Procedures:   See below for x-ray reports  Antimicrobials:  Anti-infectives (From admission, onward)   Start     Dose/Rate Route Frequency Ordered Stop   03/26/19 0900  ceFEPIme (MAXIPIME) 2 g in sodium chloride 0.9 % 100 mL IVPB     2 g 200 mL/hr over 30 Minutes Intravenous Every 8 hours 03/26/19 0848     03/26/19 0845  metroNIDAZOLE (FLAGYL) IVPB 500 mg     500 mg 100 mL/hr over 60 Minutes Intravenous Every 8 hours 03/26/19 0848     03/24/19 2200  vancomycin (VANCOCIN) IVPB 750 mg/150 ml premix  Status:  Discontinued     750 mg 150 mL/hr over 60 Minutes Intravenous Every 8 hours 03/24/19 1352 03/24/19 2039   03/24/19 2100  doxycycline (VIBRA-TABS) tablet 100 mg  Status:  Discontinued     100 mg Oral 2 times daily 03/24/19 2039 03/26/19 0848   03/24/19 1400  aztreonam (AZACTAM) 2 g in sodium chloride 0.9 % 100 mL IVPB     2 g 200 mL/hr over 30 Minutes Intravenous  Once 03/24/19 1347 03/24/19 1445   03/24/19 1400  vancomycin (VANCOCIN) 1,250 mg in sodium chloride 0.9 % 250 mL IVPB     1,250 mg 166.7 mL/hr over 90 Minutes Intravenous  Once 03/24/19 1349 03/24/19 1657       Subjective: Denies chest pain, no nausea, no vomiting.  Patient continues spiking fever (T-max in the last 24-hour 102.3); having productive cough and feeling unable to speak in full sentences due to shortness of breath intermittently.  Patient reports some difficulty sleeping at night.  Objective: Vitals:   03/27/19 0519 03/27/19 0728 03/27/19 1334 03/27/19 1501  BP: 108/63  108/66   Pulse: 87     Resp: 18  18   Temp: 98.9 F (37.2 C)     TempSrc: Oral     SpO2: 96% 93% 98% 98%  Weight:      Height:        Intake/Output Summary (Last 24 hours) at 03/27/2019 1532 Last data filed at 03/27/2019 1300 Gross per 24 hour  Intake 1360.08 ml  Output 775 ml  Net 585.08 ml   Filed Weights   03/24/19 1112 03/24/19 2032  Weight: 63 kg 62.9 kg    Examination: General exam: Alert, awake, oriented x 3; reports some difficulty speaking in full sentences due to shortness of breath, continued having productive cough, using 3-3.5 L nasal cannula supplementation and is spiking fever. Respiratory system: Positive rhonchi right, no crackles, no wheezing currently.  No using accessory muscle. Cardiovascular system:RRR. No murmurs, rubs, gallops.  No JVD on exam. Gastrointestinal system: Abdomen is nondistended, soft and nontender. No organomegaly or masses felt. Normal bowel sounds heard. Central nervous system: Alert  and oriented. No focal neurological deficits. Extremities: No C/C/E, +pedal pulses Skin: No rashes, lesions or ulcers Psychiatry: Judgement and insight appear normal. Mood & affect appropriate.    Data Reviewed: I have personally reviewed following labs and imaging studies  CBC: Recent Labs  Lab 03/24/19 1232 03/25/19 0504  WBC 7.5 6.6  NEUTROABS 5.4  --   HGB 9.6* 9.5*  HCT 31.1* 30.2*  MCV 85.0 83.4  PLT 289 321   Basic Metabolic Panel: Recent Labs  Lab 03/24/19 1232 03/25/19 0504 03/26/19 0615  NA 134* 133* 133*  K 4.3 4.5 4.1  CL 97* 99 98  CO2 GLUCOSE 87 87 144*  BUN 5* 7 7  CREATININE 0.49* 0.55* 0.55*  CALCIUM 8.6* 8.3* 8.3*   GFR: Estimated Creatinine Clearance: 89.5 mL/min (A) (by C-G formula based on SCr of 0.55 mg/dL (L)).   Liver Function Tests: Recent Labs  Lab 03/24/19 1232  AST 45*  ALT 78*  ALKPHOS 431*  BILITOT 0.3  PROT 6.1*  ALBUMIN 2.4*   Cardiac Enzymes: Recent Labs  Lab 03/24/19 1232  TROPONINI <0.03   Urine analysis:    Component Value Date/Time   COLORURINE YELLOW 03/24/2019 1215   APPEARANCEUR CLEAR 03/24/2019 1215   LABSPEC 1.008 03/24/2019 1215   PHURINE 7.0 03/24/2019 1215   GLUCOSEU NEGATIVE 03/24/2019  1215   HGBUR NEGATIVE 03/24/2019 1215   BILIRUBINUR NEGATIVE 03/24/2019 1215   KETONESUR NEGATIVE 03/24/2019 1215   PROTEINUR NEGATIVE 03/24/2019 1215   UROBILINOGEN 0.2 03/01/2011 1427   NITRITE NEGATIVE 03/24/2019 1215   LEUKOCYTESUR NEGATIVE 03/24/2019 1215    Recent Results (from the past 240 hour(s))  Culture, blood (routine x 2)     Status: None (Preliminary result)   Collection Time: 03/24/19 12:40 PM  Result Value Ref Range Status   Specimen Description RIGHT ANTECUBITAL  Final   Special Requests   Final    BOTTLES DRAWN AEROBIC AND ANAEROBIC Blood Culture adequate volume   Culture   Final    NO GROWTH 3 DAYS Performed at [facility], [address] [place], [place] [address]    Report  Status PENDING  Incomplete  Culture, blood (routine x 2)     Status: None (Preliminary result)   Collection Time: 03/24/19 12:44 PM  Result Value Ref Range Status   Specimen Description BLOOD RIGHT ARM  Final   Special Requests   Final    BOTTLES DRAWN AEROBIC AND ANAEROBIC Blood Culture adequate volume   Culture   Final    NO GROWTH 3 DAYS Performed at [facility], [address] [place], [place] [address]    Report Status PENDING  Incomplete  MRSA PCR Screening     Status: None   Collection Time: 03/24/19  2:10 PM  Result Value Ref Range Status   MRSA by PCR NEGATIVE NEGATIVE Final    Comment:        The GeneXpert MRSA Assay (FDA approved for NASAL specimens only), is one component of a comprehensive MRSA colonization surveillance program. It is not intended to diagnose MRSA infection nor to guide or monitor treatment for MRSA infections. Performed at [facility], [address] [place], [place] [address]   SARS Coronavirus 2 [facility](Hospital order, Performed in [facility] hospital lab)     Status: None   Collection Time: 03/24/19  3:24 PM  Result Value Ref Range Status   SARS Coronavirus 2 NEGATIVE NEGATIVE Final    Comment: (NOTE) If result is NEGATIVE SARS-CoV-2 target nucleic acids are NOT DETECTED. The SARS-CoV-2 RNA is generally detectable in upper and lower  respiratory specimens during the acute phase of infection. The lowest  concentration of SARS-CoV-2 viral copies this assay can detect is 250  copies / mL. A negative result does not preclude SARS-CoV-2 infection  and should not be used as the sole basis for treatment or other  patient management decisions.  A negative result may occur with  improper specimen collection / handling, submission of specimen other  than nasopharyngeal swab, presence of viral mutation(s) within the  areas targeted by this assay, and inadequate number of viral copies  (<250 copies / mL). A negative result must be combined with clinical   observations, patient history, and epidemiological information. If result is POSITIVE SARS-CoV-2 target nucleic acids are DETECTED. The SARS-CoV-2 RNA is generally detectable in upper and lower  respiratory specimens dur ing the acute phase of infection.  Positive  results are indicative of active infection with SARS-CoV-2.  Clinical  correlation with patient history and other diagnostic information is  necessary to determine patient infection status.  Positive results do  not rule out bacterial infection or co-infection with other viruses. If result is PRESUMPTIVE POSTIVE SARS-CoV-2 nucleic acids MAY BE PRESENT.   A presumptive positive result was obtained on the submitted specimen  and confirmed on  repeat testing.  While 2019 novel coronavirus  (SARS-CoV-2) nucleic acids may be present in the submitted sample  additional confirmatory testing may be necessary for epidemiological  and / or clinical management purposes  to differentiate between  SARS-CoV-2 and other Sarbecovirus currently known to infect humans.  If clinically indicated additional testing with an alternate test  methodology [phone]) is advised. The SARS-CoV-2 RNA is generally  detectable in upper and lower respiratory sp ecimens during the acute  phase of infection. The expected result is Negative. Fact Sheet for Patients:  BoilerBrush.com.cy Fact Sheet for Healthcare Providers: https://pope.com/ This test is not yet approved or cleared by the [place] FDA and has been authorized for detection and/or diagnosis of SARS-CoV-2 by FDA under an Emergency Use Authorization (EUA).  This EUA will remain in effect (meaning this test can be used) for the duration of the COVID-19 declaration under Section 564(b)(1) of the Act, 21 U.S.C. section 360bbb-3(b)(1), unless the authorization is terminated or revoked sooner. Performed at [facility], [address],  [place], [place] [address]   Respiratory Panel by PCR     Status: None   Collection Time: 03/24/19  4:05 PM  Result Value Ref Range Status   Adenovirus NOT DETECTED NOT DETECTED Final   Coronavirus 229E NOT DETECTED NOT DETECTED Final    Comment: (NOTE) The Coronavirus on the Respiratory Panel, DOES NOT test for the novel  Coronavirus (2019 nCoV)    Coronavirus HKU1 NOT DETECTED NOT DETECTED Final   Coronavirus NL63 NOT DETECTED NOT DETECTED Final   Coronavirus OC43 NOT DETECTED NOT DETECTED Final   Metapneumovirus NOT DETECTED NOT DETECTED Final   Rhinovirus / Enterovirus NOT DETECTED NOT DETECTED Final   Influenza A NOT DETECTED NOT DETECTED Final   Influenza B NOT DETECTED NOT DETECTED Final   Parainfluenza Virus 1 NOT DETECTED NOT DETECTED Final   Parainfluenza Virus 2 NOT DETECTED NOT DETECTED Final   Parainfluenza Virus 3 NOT DETECTED NOT DETECTED Final   Parainfluenza Virus 4 NOT DETECTED NOT DETECTED Final   Respiratory Syncytial Virus NOT DETECTED NOT DETECTED Final   Bordetella pertussis NOT DETECTED NOT DETECTED Final   Chlamydophila pneumoniae NOT DETECTED NOT DETECTED Final   Mycoplasma pneumoniae NOT DETECTED NOT DETECTED Final    Comment: Performed at [facility], [address] [place], [place] [address]  Gram stain     Status: None   Collection Time: 03/27/19  1:48 PM  Result Value Ref Range Status   Specimen Description PLEURAL  Final   Special Requests NONE  Final   Gram Stain   Final    CYTOSPIN SMEAR NO ORGANISMS SEEN WBC PRESENT,BOTH PMN AND MONONUCLEAR Performed at [facility], [address], [place], [place] [address]    Report Status 03/27/2019 FINAL  Final    Radiology Studies: Dg Chest 1 View  Result Date: 03/27/2019 CLINICAL DATA:  Post LEFT thoracentesis EXAM: CHEST  1 VIEW COMPARISON:  03/24/2019 FINDINGS: Stable heart size mediastinal contours. BILATERAL pulmonary infiltrates, greatest at RIGHT base. No residual LEFT pleural effusion. No  pneumothorax following thoracentesis. IMPRESSION: Persistent pulmonary infiltrates. No pneumothorax following LEFT thoracentesis. Electronically Signed   By: Ulyses Southward M.D.   On: 03/27/2019 14:17   Ct Chest W Contrast  Result Date: 03/26/2019 CLINICAL DATA:  58 year old with empyema.  History of pneumonia. EXAM: CT CHEST WITH CONTRAST TECHNIQUE: Multidetector CT imaging of the chest was performed during intravenous contrast administration. CONTRAST:  75mL OMNIPAQUE IOHEXOL 300 MG/ML  SOLN COMPARISON:  Chest radiograms 03/24/2019. Chest CT 02/28/2019 and chest CT from 01/04/2018 FINDINGS: Cardiovascular: Atherosclerotic calcifications in the thoracic aorta. No evidence for aneurysm. Main and central pulmonary arteries are patent. Heart size is normal without significant pericardial fluid. Mediastinum/Nodes: Evidence for mild mediastinal and hilar lymphadenopathy similar to the previous examination. Index lymph node in the pre carinal space measures 1.3 cm in short axis and stable. Left hilar node measures 1.2 cm in short axis on sequence 3, image 77. No axillary lymph node enlargement. Small supraclavicular lymph nodes are similar to the prior examination. Lungs/Pleura: Small bilateral pleural effusions have slightly decreased in size. The left pleural fluid appears to be loculated along the posterior left upper chest and at the left lung base. Right pleural fluid appears to be slightly complex along the upper portion of the right major fissure. The trachea and mainstem bronchi are patent. Severe centrilobular emphysema. The airspace disease in the right upper lung has improved since 02/28/2019. Small amount of residual disease in the medial right upper lobe. There is also decreased airspace disease in the right middle lobe. However, there is markedly increased interstitial and airspace densities in the right lower lobe. Again noted are patchy areas of consolidation and airspace disease in the left lower lobe  but the distribution has changed since 02/28/2019. Areas along the lateral left lower lobe have resolved and there are new areas of disease in the medial left lower lobe. Airspace disease in left upper lung has markedly improved but now there is a focal bandlike area of consolidation in left upper lobe which has formed adjacent to the complex left pleural fluid. Residual ground-glass and airspace densities in the left upper lobe. Negative for a large pneumothorax. Upper Abdomen: Images of the upper abdomen are unremarkable. Musculoskeletal: No acute bone abnormality. IMPRESSION: 1. Persistent bilateral parenchymal lung disease. The airspace disease in the upper lungs has decreased since 02/28/2019 but there has clearly been progression of disease in the right lower lobe and the distribution of disease in the left lower lobe has changed. Findings are compatible with acute on chronic disease. 2. Interval development of bandlike consolidation in left upper lobe adjacent to the complex left pleural fluid. 3. Bilateral pleural effusions. Overall, pleural fluid volume has decreased since 02/28/2019. Pleural fluid appears to be complex or loculated. 4. Minimal change in the mediastinal and hilar lymphadenopathy. Lymphadenopathy is likely reactive. 5. Aortic Atherosclerosis (ICD10-I70.0) and Emphysema (ICD10-J43.9). Electronically Signed   By: Richarda Overlie M.D.   On: 03/26/2019 11:21   US Thoracentesis Asp Pleural Space W/img Guide  Result Date: 03/27/2019 INDICATION: LEFT pleural effusion EXAM: ULTRASOUND GUIDED DIAGNOSTIC AND THERAPEUTIC LEFT THORACENTESIS MEDICATIONS: None. COMPLICATIONS: None immediate. PROCEDURE: Procedure, benefits, and risks of procedure were discussed with patient. Written informed consent for procedure was obtained. Time out protocol followed. Pleural effusion localized by ultrasound at the posterior LEFT hemithorax. Skin prepped and draped in usual sterile fashion. Skin and soft tissues  anesthetized with 10 mL of 1% lidocaine. 8 Rada thoracentesis catheter placed into the LEFT pleural space. 700 mL of serosanguineous LEFT pleural fluid aspirated by syringe pump. Procedure tolerated well by patient without immediate complication. FINDINGS: A total of approximately 700 mL of LEFT pleural fluid was removed. Samples were sent to the laboratory as requested by the clinical team. IMPRESSION: Successful ultrasound guided LEFT thoracentesis yielding 700 mL of pleural fluid. Electronically Signed   By: Ulyses Southward M.D.   On: 03/27/2019 14:16   Scheduled Meds:  albuterol  2.5 mg Nebulization TID   aspirin EC  81 mg Oral QPM   atorvastatin  20 mg Oral q1800   diltiazem  180 mg Oral Daily   divalproex  1,500 mg Oral QHS   enoxaparin (LOVENOX) injection  40 mg Subcutaneous Q24H   feeding supplement (ENSURE ENLIVE)  237 mL Oral BID BM   FLUoxetine  40 mg Oral Daily   fluticasone  1 spray Each Nare Daily   folic acid  1 mg Oral Daily   gabapentin  400 mg Oral Daily   And   gabapentin  800 mg Oral QHS   guaiFENesin  600 mg Oral BID   loratadine  10 mg Oral Daily   methocarbamol  500 mg Oral TID   metoprolol tartrate  25 mg Oral BID   OLANZapine  10 mg Oral QHS   potassium chloride SA  40 mEq Oral Daily   thiamine  250 mg Oral Daily   traZODone  50 mg Oral QHS   umeclidinium bromide  1 puff Inhalation Daily   Continuous Infusions:  ceFEPime (MAXIPIME) IV 2 g (03/27/19 0518)   metronidazole 500 mg (03/27/19 0411)     LOS: 1 day    Time spent: 30 minutes    Vassie Loll, MD [facility] Pager [phone]  03/27/2019, 3:32 PM

## 2019-03-27 NOTE — Procedures (Signed)
PreOperative Dx: LEFT pleural effusion Postoperative Dx: LEFT pleural effusion Procedure:   US guided LEFT thoracentesis Radiologist:  Tyron Russell Anesthesia:  10 ml of 1% lidocaine Specimen:  0.7 L of serosanguinous colored fluid EBL:   < 1 ml Complications: None

## 2019-03-27 NOTE — TOC Initial Note (Signed)
Transition of Care [facility]) - Initial/Assessment Note    Patient Details  Name: Jeffrey [name] MRN: [id number] Date of Birth: 1961-06-13  Transition of Care [facility]) CM/SW Contact:    Leitha Bleak, RN Phone Number: 03/27/2019, 1:19 PM  Clinical Narrative:     Patient admitted for COPD, reviewed patient charge, high risk for readmission. Patient is active with [facility] for RN/PT. Will continue to follow for discharge planning.              Expected Discharge Plan: Home w Home Health Services Barriers to Discharge: Continued Medical Work up   Patient Goals and CMS Choice        Expected Discharge Plan and Services Expected Discharge Plan: Home w Home Health Services   Discharge Planning Services: CM Consult   Living arrangements for the past 2 months: Single Family Home                             HH Agency: [facility]        Prior Living Arrangements/Services Living arrangements for the past 2 months: Single Family Home Lives with:: Self, Adult Children, Minor Children   Do you feel safe going back to the place where you live?: Yes      Need for Family Participation in Patient Care: Yes (Comment) Care giver support system in place?: Yes (comment) Current home services: Home PT, Home RN Criminal Activity/Legal Involvement Pertinent to Current Situation/Hospitalization: No - Comment as needed  Activities of Daily Living Home Assistive Devices/Equipment: Scales, Oxygen, Dentures (specify type), Nebulizer ADL Screening (condition at time of admission) Patient's cognitive ability adequate to safely complete daily activities?: Yes Is the patient deaf or have difficulty hearing?: No Does the patient have difficulty seeing, even when wearing glasses/contacts?: No Does the patient have difficulty concentrating, remembering, or making decisions?: No Patient able to express need for assistance with ADLs?: Yes Does the patient have  difficulty dressing or bathing?: No Independently performs ADLs?: Yes (appropriate for developmental age) Does the patient have difficulty walking or climbing stairs?: No Weakness of Legs: None Weakness of Arms/Hands: None                                  Admission diagnosis:  Abnormal Labs Patient Active Problem List   Diagnosis Date Noted  . Bronchiectasis (HCC) 03/24/2019  . Fever 03/24/2019  . Cough   . Dysphagia   . Palliative care by specialist   . Goals of care, counseling/discussion   . Hepatic encephalopathy (HCC)   . COPD exacerbation (HCC) 09/16/2018  . Essential hypertension 09/16/2018  . GERD (gastroesophageal reflux disease) 09/16/2018  . Hypoxia   . Chronic diastolic CHF (congestive heart failure) (HCC)   . Bipolar disorder with severe depression (HCC) 02/01/2018  . Alcohol use disorder, severe, dependence (HCC) 02/01/2018  . Cannabis use disorder, severe, dependence (HCC) 02/01/2018  . MDD (major depressive disorder), recurrent episode, severe (HCC) 01/31/2018  . Internal and external bleeding hemorrhoids   . Rectal bleeding   . Iron deficiency anemia due to chronic blood loss 03/09/2017  . Anemia   . GIB (gastrointestinal bleeding) 02/07/2017  . Peptic ulcer disease   . Bipolar disorder (HCC)   . 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 H/O: depression 08/26/2016  . Dyslipidemia 08/26/2016  . Tobacco use disorder 08/26/2016  . [name] (peripheral  vascular disease) (HCC) 06/18/2015  . Chronic diarrhea 05/02/2013  . Hematochezia 05/02/2013  . Abnormal weight loss 05/02/2013  . Abdominal pain, periumbilical 05/02/2013   PCP:  Gareth MorganKnowlton, Steve, MD Pharmacy:   [facility] [address] - [place], [place] - [address] [place] [address] [place] [address] [place] [address] Phone: [phone] Fax: [phone]  [facility] [place] [id number] [place] [id number] Phone: [phone]  Fax: [phone]         Readmission Risk Interventions Readmission Risk Prevention Plan 02/20/2019  Transportation Screening Complete  Medication Review (RN Care Manager) Not Complete  PCP or Specialist appointment within 3-5 days of discharge Complete  HRI or Home Care Consult (No Data)  SW Recovery Care/Counseling Consult (No Data)  Palliative Care Screening Not Applicable  Skilled Nursing Facility Not Applicable  Some recent data might be hidden

## 2019-03-27 NOTE — Progress Notes (Signed)
Present with Mr Jeffrey [name] for a brief visit offering emotional and spiritual support.

## 2019-03-27 NOTE — Progress Notes (Signed)
Thoracentesis complete no signs of distress.  

## 2019-03-27 NOTE — Care Management Important Message (Signed)
Important Message  Patient Details  Name: Jeffrey [name] MRN: [id number] Date of Birth: 1961-03-17   Medicare Important Message Given:  Yes    Corey Harold 03/27/2019, 1:31 PM

## 2019-03-28 LAB — PH, BODY FLUID: pH, Body Fluid: 7.5

## 2019-03-28 NOTE — Progress Notes (Signed)
PROGRESS NOTE    Jeffrey [name]  ZOX:[id number] DOB: November 01, 1961 DOA: 03/24/2019 PCP: Gareth Morgan, MD     Brief Narrative:  58 y.o. male with a history of severe COPD, chronic diastolic heart failure, GERD, bipolar disorder with depression.  Patient has had 2 hospitalizations: Was hospitalized from 12/16/2020 to 01/13/2019 for Klebsiella pneumonia with acute respiratory failure and intubation.  He was admitted on 3/26 for fevers and cough, reintubated on 3/27 and eventually sent home on 03/07/2019.  He was sent home on 14 days of Levaquin and a steroid taper.  He has completed both of these.  The patient has had a fever over the past 2 days, as high as 103.  He was seen by the visiting home nurse, who sent him to the hospital for evaluation.  Assessment & Plan: 1-fever/bronchiectasis: Presumed to be secondary to bronchiectasis; patient reports productive cough and some shortness of breath at baseline. -Continue as needed antipyretics -Follow blood cultures and sputum cx's -fever curve improving. -Patient is negative for COVID-19 -Given ongoing fever CT chest was done to rule out empyema; images demonstrated complex effusion collection on his left and right lungs (2 on the left one on the right); radiology has been consulted for diagnostic thoracentesis, which was done on 03/27/19. Will follow fluid analysis results..   -Continue IV antibiotics (using cefepime and Flagyl). -Continue bronchodilators -CRP is elevated, suggesting inflammatory response. -continue flutter valve and incentive spirometry. -There is no other source of infection identified on physical exam.  No meningeal symptoms.  2-COPD/chronic respiratory failure -Currently no wheezing -Continue current bronchodilator therapy with Spiriva, Breo Ellipta and albuterol. -Continue oxygen supplementation. -Patient reported improvement in his difficulty speaking in full sentences due to shortness of breath, intermittently. -Given lack  of wheezing will hold on a steroids treatment (patient has just completed steroids tapering prior to admission).   3-chronic diastolic heart failure -Appears to be compensated -Continue to follow daily weights and strict I's and O's -Low-sodium diet has been ordered -Patient educated about compliance with diet, medication and daily weight checks.  4-gastroesophageal reflux disease -Continue PPI  5-essential hypertension -A stable and well-controlled -Continue current antihypertensive regimen.  6-bipolar disorder -Mood is a stable -Denies hallucinations and suicidal ideation -Continue home antidepressant meds.  7-hyperlipidemia -Continue statins.  8-allergy rhinitis/PND -Continue treatment with Claritin and Flonase  9-history of alcohol abuse -Cessation counseling has been provided -No signs of active withdrawal -Continue monitoring on telemetry -Follow CIWA protocol.  10-insomnia -Will use low-dose of as needed trazodone.  DVT prophylaxis: Lovenox Code Status: Full code Family Communication: No family at bedside Disposition Plan: Remains hospitalized, CT chest demonstrated some complex effusion collection 2 on the left and 1 on the right; along with worsening right lower lobe infiltrate. Will continue current antibiotics, cefepime and Flagyl (covering for Pseudomonas, HCAP and aspiration).  Case discussed with ID.  No complaints to suggest meningitis or any other source of infection currently.  Radiology service contacted for diagnostic thoracentesis and performed on 03/27/19.  Consultants:   ID (Dr. Luciana Axe); recommendations to check CT chest and broaden antibiotics to cefepime and Flagyl.  Radiology (consulted for diagnostic thoracentesis).  Procedures:   See below for x-ray reports  Antimicrobials:  Anti-infectives (From admission, onward)   Start     Dose/Rate Route Frequency Ordered Stop   03/26/19 0900  ceFEPIme (MAXIPIME) 2 g in sodium chloride 0.9 % 100 mL  IVPB     2 g 200 mL/hr over 30 Minutes Intravenous Every 8 hours  03/26/19 0848     03/26/19 0845  metroNIDAZOLE (FLAGYL) IVPB 500 mg     500 mg 100 mL/hr over 60 Minutes Intravenous Every 8 hours 03/26/19 0848     03/24/19 2200  vancomycin (VANCOCIN) IVPB 750 mg/150 ml premix  Status:  Discontinued     750 mg 150 mL/hr over 60 Minutes Intravenous Every 8 hours 03/24/19 1352 03/24/19 2039   03/24/19 2100  doxycycline (VIBRA-TABS) tablet 100 mg  Status:  Discontinued     100 mg Oral 2 times daily 03/24/19 2039 03/26/19 0848   03/24/19 1400  aztreonam (AZACTAM) 2 g in sodium chloride 0.9 % 100 mL IVPB     2 g 200 mL/hr over 30 Minutes Intravenous  Once 03/24/19 1347 03/24/19 1445   03/24/19 1400  vancomycin (VANCOCIN) 1,250 mg in sodium chloride 0.9 % 250 mL IVPB     1,250 mg 166.7 mL/hr over 90 Minutes Intravenous  Once 03/24/19 1349 03/24/19 1657      Subjective: No chest pain, no nausea, no vomiting.  Low-grade temperature overnight; still reporting productive cough.  Patient expressed improvement in his overall shortness of breath.  Objective: Vitals:   03/28/19 0457 03/28/19 0758 03/28/19 1414 03/28/19 1513  BP: (!) 101/58   107/64  Pulse: 90   93  Resp: 20   (!) 21  Temp: 100.1 F (37.8 C)   98.8 F (37.1 C)  TempSrc: Oral   Oral  SpO2: 96% 96% 94% 93%  Weight:      Height:        Intake/Output Summary (Last 24 hours) at 03/28/2019 1534 Last data filed at 03/28/2019 1323 Gross per 24 hour  Intake 1282.24 ml  Output 150 ml  Net 1132.24 ml   Filed Weights   03/24/19 1112 03/24/19 2032  Weight: 63 kg 62.9 kg    Examination: General exam: Alert, awake, oriented x 3; no chest pain, no nausea, no vomiting patient reports some improvement in his shortness of breath; continues to have some productive cough.  Low-grade temperature overnight. Respiratory system: Currently no wheezing, no using accessory muscles, positive rhonchi diffusely (right more than  left). Cardiovascular system:RRR. No murmurs, rubs, gallops. Gastrointestinal system: Abdomen is nondistended, soft and nontender. No organomegaly or masses felt. Normal bowel sounds heard. Central nervous system: Alert and oriented. No focal neurological deficits. Extremities: No C/C/E, +pedal pulses Skin: No rashes, lesions or ulcers Psychiatry: Judgement and insight appear normal. Mood & affect appropriate.    Data Reviewed: I have personally reviewed following labs and imaging studies  CBC: Recent Labs  Lab 03/24/19 1232 03/25/19 0504  WBC 7.5 6.6  NEUTROABS 5.4  --   HGB 9.6* 9.5*  HCT 31.1* 30.2*  MCV 85.0 83.4  PLT 289 321   Basic Metabolic Panel: Recent Labs  Lab 03/24/19 1232 03/25/19 0504 03/26/19 0615  NA 134* 133* 133*  K 4.3 4.5 4.1  CL 97* 99 98  CO2 27 26 25   GLUCOSE 87 87 144*  BUN 5* 7 7  CREATININE 0.49* 0.55* 0.55*  CALCIUM 8.6* 8.3* 8.3*   GFR: Estimated Creatinine Clearance: 89.5 mL/min (A) (by C-G formula based on SCr of 0.55 mg/dL (L)).   Liver Function Tests: Recent Labs  Lab 03/24/19 1232  AST 45*  ALT 78*  ALKPHOS 431*  BILITOT 0.3  PROT 6.1*  ALBUMIN 2.4*   Cardiac Enzymes: Recent Labs  Lab 03/24/19 1232  TROPONINI <0.03   Urine analysis:    Component Value Date/Time  COLORURINE YELLOW 03/24/2019 1215   APPEARANCEUR CLEAR 03/24/2019 1215   LABSPEC 1.008 03/24/2019 1215   PHURINE 7.0 03/24/2019 1215   GLUCOSEU NEGATIVE 03/24/2019 1215   HGBUR NEGATIVE 03/24/2019 1215   BILIRUBINUR NEGATIVE 03/24/2019 1215   KETONESUR NEGATIVE 03/24/2019 1215   PROTEINUR NEGATIVE 03/24/2019 1215   UROBILINOGEN 0.2 03/01/2011 1427   NITRITE NEGATIVE 03/24/2019 1215   LEUKOCYTESUR NEGATIVE 03/24/2019 1215    Recent Results (from the past 240 hour(s))  Culture, blood (routine x 2)     Status: None (Preliminary result)   Collection Time: 03/24/19 12:40 PM  Result Value Ref Range Status   Specimen Description RIGHT ANTECUBITAL  Final    Special Requests   Final    BOTTLES DRAWN AEROBIC AND ANAEROBIC Blood Culture adequate volume   Culture   Final    NO GROWTH 4 DAYS Performed at [facility], [address] [place], [place] [address]    Report Status PENDING  Incomplete  Culture, blood (routine x 2)     Status: None (Preliminary result)   Collection Time: 03/24/19 12:44 PM  Result Value Ref Range Status   Specimen Description BLOOD RIGHT ARM  Final   Special Requests   Final    BOTTLES DRAWN AEROBIC AND ANAEROBIC Blood Culture adequate volume   Culture   Final    NO GROWTH 4 DAYS Performed at [facility], [address], [place], [place] [address]    Report Status PENDING  Incomplete  MRSA PCR Screening     Status: None   Collection Time: 03/24/19  2:10 PM  Result Value Ref Range Status   MRSA by PCR NEGATIVE NEGATIVE Final    Comment:        The GeneXpert MRSA Assay (FDA approved for NASAL specimens only), is one component of a comprehensive MRSA colonization surveillance program. It is not intended to diagnose MRSA infection nor to guide or monitor treatment for MRSA infections. Performed at [facility], [address], [place], [place] [address]   SARS Coronavirus 2 [facility] order, Performed in [facility] hospital lab)     Status: None   Collection Time: 03/24/19  3:24 PM  Result Value Ref Range Status   SARS Coronavirus 2 NEGATIVE NEGATIVE Final    Comment: (NOTE) If result is NEGATIVE SARS-CoV-2 target nucleic acids are NOT DETECTED. The SARS-CoV-2 RNA is generally detectable in upper and lower  respiratory specimens during the acute phase of infection. The lowest  concentration of SARS-CoV-2 viral copies this assay can detect is 250  copies / mL. A negative result does not preclude SARS-CoV-2 infection  and should not be used as the sole basis for treatment or other  patient management decisions.  A negative result may occur with  improper specimen collection / handling, submission of  specimen other  than nasopharyngeal swab, presence of viral mutation(s) within the  areas targeted by this assay, and inadequate number of viral copies  (<250 copies / mL). A negative result must be combined with clinical  observations, patient history, and epidemiological information. If result is POSITIVE SARS-CoV-2 target nucleic acids are DETECTED. The SARS-CoV-2 RNA is generally detectable in upper and lower  respiratory specimens dur ing the acute phase of infection.  Positive  results are indicative of active infection with SARS-CoV-2.  Clinical  correlation with patient history and other diagnostic information is  necessary to determine patient infection status.  Positive results do  not rule out bacterial infection or co-infection with other viruses.  If result is PRESUMPTIVE POSTIVE SARS-CoV-2 nucleic acids MAY BE PRESENT.   A presumptive positive result was obtained on the submitted specimen  and confirmed on repeat testing.  While 2019 novel coronavirus  (SARS-CoV-2) nucleic acids may be present in the submitted sample  additional confirmatory testing may be necessary for epidemiological  and / or clinical management purposes  to differentiate between  SARS-CoV-2 and other Sarbecovirus currently known to infect humans.  If clinically indicated additional testing with an alternate test  methodology [phone]) is advised. The SARS-CoV-2 RNA is generally  detectable in upper and lower respiratory sp ecimens during the acute  phase of infection. The expected result is Negative. Fact Sheet for Patients:  BoilerBrush.com.cy Fact Sheet for Healthcare Providers: https://pope.com/ This test is not yet approved or cleared by the [place] FDA and has been authorized for detection and/or diagnosis of SARS-CoV-2 by FDA under an Emergency Use Authorization (EUA).  This EUA will remain in effect (meaning this test can be used) for the  duration of the COVID-19 declaration under Section 564(b)(1) of the Act, 21 U.S.C. section 360bbb-3(b)(1), unless the authorization is terminated or revoked sooner. Performed at [facility], [address] [place], [place] [address]   Respiratory Panel by PCR     Status: None   Collection Time: 03/24/19  4:05 PM  Result Value Ref Range Status   Adenovirus NOT DETECTED NOT DETECTED Final   Coronavirus 229E NOT DETECTED NOT DETECTED Final    Comment: (NOTE) The Coronavirus on the Respiratory Panel, DOES NOT test for the novel  Coronavirus (2019 nCoV)    Coronavirus HKU1 NOT DETECTED NOT DETECTED Final   Coronavirus NL63 NOT DETECTED NOT DETECTED Final   Coronavirus OC43 NOT DETECTED NOT DETECTED Final   Metapneumovirus NOT DETECTED NOT DETECTED Final   Rhinovirus / Enterovirus NOT DETECTED NOT DETECTED Final   Influenza A NOT DETECTED NOT DETECTED Final   Influenza B NOT DETECTED NOT DETECTED Final   Parainfluenza Virus 1 NOT DETECTED NOT DETECTED Final   Parainfluenza Virus 2 NOT DETECTED NOT DETECTED Final   Parainfluenza Virus 3 NOT DETECTED NOT DETECTED Final   Parainfluenza Virus 4 NOT DETECTED NOT DETECTED Final   Respiratory Syncytial Virus NOT DETECTED NOT DETECTED Final   Bordetella pertussis NOT DETECTED NOT DETECTED Final   Chlamydophila pneumoniae NOT DETECTED NOT DETECTED Final   Mycoplasma pneumoniae NOT DETECTED NOT DETECTED Final    Comment: Performed at [facility], [address], [place], [place] [address]  Gram stain     Status: None   Collection Time: 03/27/19  1:48 PM  Result Value Ref Range Status   Specimen Description PLEURAL  Final   Special Requests NONE  Final   Gram Stain   Final    CYTOSPIN SMEAR NO ORGANISMS SEEN WBC PRESENT,BOTH PMN AND MONONUCLEAR Performed at [facility], [address], [place], [place] [address]    Report Status 03/27/2019 FINAL  Final  Culture, body fluid-bottle     Status: None (Preliminary result)   Collection  Time: 03/27/19  1:48 PM  Result Value Ref Range Status   Specimen Description PLEURAL  Final   Special Requests BOTTLES DRAWN AEROBIC AND ANAEROBIC 10 CC EACH  Final   Culture   Final    NO GROWTH < 24 HOURS Performed at [facility], [address], [place], [place] [address]    Report Status PENDING  Incomplete    Radiology Studies: Dg Chest 1 View  Result  Date: 03/27/2019 CLINICAL DATA:  Post LEFT thoracentesis EXAM: CHEST  1 VIEW COMPARISON:  03/24/2019 FINDINGS: Stable heart size mediastinal contours. BILATERAL pulmonary infiltrates, greatest at RIGHT base. No residual LEFT pleural effusion. No pneumothorax following thoracentesis. IMPRESSION: Persistent pulmonary infiltrates. No pneumothorax following LEFT thoracentesis. Electronically Signed   By: Ulyses Southward M.D.   On: 03/27/2019 14:17   US Thoracentesis Asp Pleural Space W/img Guide  Result Date: 03/27/2019 INDICATION: LEFT pleural effusion EXAM: ULTRASOUND GUIDED DIAGNOSTIC AND THERAPEUTIC LEFT THORACENTESIS MEDICATIONS: None. COMPLICATIONS: None immediate. PROCEDURE: Procedure, benefits, and risks of procedure were discussed with patient. Written informed consent for procedure was obtained. Time out protocol followed. Pleural effusion localized by ultrasound at the posterior LEFT hemithorax. Skin prepped and draped in usual sterile fashion. Skin and soft tissues anesthetized with 10 mL of 1% lidocaine. 8 Sharpe thoracentesis catheter placed into the LEFT pleural space. 700 mL of serosanguineous LEFT pleural fluid aspirated by syringe pump. Procedure tolerated well by patient without immediate complication. FINDINGS: A total of approximately 700 mL of LEFT pleural fluid was removed. Samples were sent to the laboratory as requested by the clinical team. IMPRESSION: Successful ultrasound guided LEFT thoracentesis yielding 700 mL of pleural fluid. Electronically Signed   By: Ulyses Southward M.D.   On: 03/27/2019 14:16   Scheduled Meds:   albuterol  2.5 mg Nebulization TID   aspirin EC  81 mg Oral QPM   atorvastatin  20 mg Oral q1800   diltiazem  180 mg Oral Daily   divalproex  1,500 mg Oral QHS   enoxaparin (LOVENOX) injection  40 mg Subcutaneous Q24H   feeding supplement (ENSURE ENLIVE)  237 mL Oral BID BM   FLUoxetine  40 mg Oral Daily   fluticasone  1 spray Each Nare Daily   folic acid  1 mg Oral Daily   gabapentin  400 mg Oral Daily   And   gabapentin  800 mg Oral QHS   guaiFENesin  600 mg Oral BID   loratadine  10 mg Oral Daily   methocarbamol  500 mg Oral TID   metoprolol tartrate  25 mg Oral BID   OLANZapine  10 mg Oral QHS   potassium chloride SA  40 mEq Oral Daily   thiamine  250 mg Oral Daily   traZODone  50 mg Oral QHS   umeclidinium bromide  1 puff Inhalation Daily   Continuous Infusions:  ceFEPime (MAXIPIME) IV 2 g (03/28/19 1416)   metronidazole 500 mg (03/28/19 1153)     LOS: 2 days    Time spent: 30 minutes    Vassie Loll, MD [facility] Pager [phone]  03/28/2019, 3:34 PM

## 2019-03-28 NOTE — Progress Notes (Signed)
Tele discontinued per Dr. Gwenlyn Perking.

## 2019-03-29 LAB — BASIC METABOLIC PANEL
Anion gap: 11 (ref 5–15)
BUN: 8 mg/dL (ref 6–20)
CO2: 24 mmol/L (ref 22–32)
Calcium: 8.3 mg/dL — ABNORMAL LOW (ref 8.9–10.3)
Chloride: 101 mmol/L (ref 98–111)
Creatinine, Ser: 0.45 mg/dL — ABNORMAL LOW (ref 0.61–1.24)
GFR calc Af Amer: >60 mL/min (ref >60)
GFR calc non Af Amer: >60 mL/min (ref >60)
Glucose, Bld: 87 mg/dL (ref 70–99)
Potassium: 3.8 mmol/L (ref 3.5–5.1)
Sodium: 136 mmol/L (ref 135–145)

## 2019-03-29 LAB — CULTURE, BLOOD (ROUTINE X 2)
Culture: NO GROWTH
Culture: NO GROWTH
Special Requests: ADEQUATE
Special Requests: ADEQUATE

## 2019-03-29 LAB — CBC
HCT: 32.2 % — ABNORMAL LOW (ref 39.0–52.0)
Hemoglobin: 9.6 g/dL — ABNORMAL LOW (ref 13.0–17.0)
MCH: 25.6 pg — ABNORMAL LOW (ref 26.0–34.0)
MCHC: 29.8 g/dL — ABNORMAL LOW (ref 30.0–36.0)
MCV: 85.9 fL (ref 80.0–100.0)
Platelets: 360 10*3/uL (ref 150–400)
RBC: 3.75 MIL/uL — ABNORMAL LOW (ref 4.22–5.81)
RDW: 13.6 % (ref 11.5–15.5)
WBC: 6.5 10*3/uL (ref 4.0–10.5)
nRBC: 0 % (ref 0.0–0.2)

## 2019-03-29 MED ORDER — METHYLPREDNISOLONE SODIUM SUCC 40 MG IJ SOLR
40.0000 mg | Freq: Two times a day (BID) | INTRAMUSCULAR | Status: DC
  Administered 2019-03-29 (×2): 40 mg via INTRAVENOUS
  Filled 2019-03-29 (×2): qty 1

## 2019-03-29 MED ORDER — BUDESONIDE 0.25 MG/2ML IN SUSP
0.2500 mg | Freq: Two times a day (BID) | RESPIRATORY_TRACT | Status: DC
  Administered 2019-03-29 – 2019-03-30 (×2): 0.25 mg via RESPIRATORY_TRACT
  Filled 2019-03-29 (×2): qty 2

## 2019-03-29 NOTE — Progress Notes (Signed)
PROGRESS NOTE    Jeffrey [name]  ZOX:[id number] DOB: 1961-01-17 DOA: 03/24/2019 PCP: Gareth Morgan, MD   Brief Narrative:  Per HPI: 58 y.o.malewith a history of severe COPD, chronic diastolic heart failure, GERD, bipolar disorder with depression. Patient has had 2 hospitalizations: Was hospitalized from 12/16/2020 to 01/13/2019 for Klebsiella pneumonia with acute respiratory failure and intubation. He was admitted on 3/26 for fevers and cough, reintubated on 3/27 and eventually sent home on 03/07/2019. He was sent home on 14 days of Levaquin and a steroid taper. He has completed both of these. The patient has had a fever over the past 2 days, as high as 103. He was seen by the visiting home nurse, who sent him to the hospital for evaluation.  Patient was admitted with fever/bronchiectasis and has been on IV cefepime and Flagyl.  This is in the setting of severe COPD.  Assessment & Plan:   Active Problems:   COPD, severe (HCC)   Bipolar disorder (HCC)   Bipolar disorder with severe depression (HCC)   Alcohol use disorder, severe, dependence (HCC)   Essential hypertension   GERD (gastroesophageal reflux disease)   Bronchiectasis (HCC)   Fever   1-fever/bronchiectasis: Presumed to be secondary to bronchiectasis; patient reports productive cough and some shortness of breath at baseline. -Continue as needed antipyretics -Follow blood cultures and sputum cx's -fever curve improving. -Patient is negative for COVID-19 -Given ongoing fever CT chest was done to rule out empyema; images demonstrated complex effusion collection on his left and right lungs (2 on the left one on the right); radiology has been consulted for diagnostic thoracentesis, which was done on 03/27/19. Will follow fluid analysis results..   -Continue IV antibiotics (using cefepime and Flagyl). -Continue bronchodilators -CRP is elevated, suggesting inflammatory response. -continue flutter valve and incentive  spirometry. -There is no other source of infection identified on physical exam.  No meningeal symptoms.  2-COPD/chronic respiratory failure -Currently no wheezing, but will add IV methylprednisolone and Pulmicort today to further assist with hypoxemia -Continue current bronchodilator therapy with Spiriva, Breo Ellipta and albuterol. -Continue oxygen supplementation. -Patient reported improvement in his difficulty speaking in full sentences due to shortness of breath, intermittently. -Given lack of wheezing will hold on a steroids treatment (patient has just completed steroids tapering prior to admission).  -We will plan to ambulate once again in a.m. with 5 L nasal cannula.  3-chronic diastolic heart failure -Appears to be compensated -Continue to follow daily weights and strict I's and O's -Low-sodium diet has been ordered -Patient educated about compliance with diet, medication and daily weight checks.  4-gastroesophageal reflux disease -Continue PPI  5-essential hypertension -A stable and well-controlled -Continue current antihypertensive regimen.  6-bipolar disorder -Mood is a stable -Denies hallucinations and suicidal ideation -Continue home antidepressant meds.  7-hyperlipidemia -Continue statins.  8-allergy rhinitis/PND -Continue treatment with Claritin and Flonase  9-history of alcohol abuse -Cessation counseling has been provided -No signs of active withdrawal -Continue monitoring on telemetry -Follow CIWA protocol.  10-insomnia -Will use low-dose of as needed trazodone.  DVT prophylaxis: Lovenox Code Status: Full code Family Communication: No family at bedside Disposition Plan:  Reviewed pleural effusion related labs and culture with no growth noted thus far.  Will maintain on antibiotics for now and start IV methylprednisolone and Pulmicort to further assist with hypoxemia that was noted with ambulation today.  Anticipate discharge in the next 24  hours if improved.  Consultants:   ID (Dr. Luciana Axe); recommendations to check CT chest and  broaden antibiotics to cefepime and Flagyl.  Radiology (consulted for diagnostic thoracentesis).  Procedures:   See below for x-ray reports  Antimicrobials:  Anti-infectives (From admission, onward)   Start     Dose/Rate Route Frequency Ordered Stop   03/26/19 0900  ceFEPIme (MAXIPIME) 2 g in sodium chloride 0.9 % 100 mL IVPB     2 g 200 mL/hr over 30 Minutes Intravenous Every 8 hours 03/26/19 0848     03/26/19 0845  metroNIDAZOLE (FLAGYL) IVPB 500 mg     500 mg 100 mL/hr over 60 Minutes Intravenous Every 8 hours 03/26/19 0848     03/24/19 2200  vancomycin (VANCOCIN) IVPB 750 mg/150 ml premix  Status:  Discontinued     750 mg 150 mL/hr over 60 Minutes Intravenous Every 8 hours 03/24/19 1352 03/24/19 2039   03/24/19 2100  doxycycline (VIBRA-TABS) tablet 100 mg  Status:  Discontinued     100 mg Oral 2 times daily 03/24/19 2039 03/26/19 0848   03/24/19 1400  aztreonam (AZACTAM) 2 g in sodium chloride 0.9 % 100 mL IVPB     2 g 200 mL/hr over 30 Minutes Intravenous  Once 03/24/19 1347 03/24/19 1445   03/24/19 1400  vancomycin (VANCOCIN) 1,250 mg in sodium chloride 0.9 % 250 mL IVPB     1,250 mg 166.7 mL/hr over 90 Minutes Intravenous  Once 03/24/19 1349 03/24/19 1657          Subjective: Patient seen and evaluated today with no new acute complaints or concerns. No acute concerns or events noted overnight.  He states that he is starting to feel somewhat better, but still has some baseline shortness of breath and hypoxemia that is preventing him from going home at this point.  Objective: Vitals:   03/29/19 0457 03/29/19 0630 03/29/19 0757 03/29/19 0800  BP: 115/63     Pulse: 90     Resp: 20     Temp: 99.3 F (37.4 C)     TempSrc: Oral     SpO2: 97%  (!) 89% 94%  Weight:  63.9 kg    Height:        Intake/Output Summary (Last 24 hours) at 03/29/2019 1123 Last data filed at 03/29/2019  0531 Gross per 24 hour  Intake 1960 ml  Output 900 ml  Net 1060 ml   Filed Weights   03/24/19 1112 03/24/19 2032 03/29/19 0630  Weight: 63 kg 62.9 kg 63.9 kg    Examination:  General exam: Appears calm and comfortable  Respiratory system: Clear to auscultation. Respiratory effort normal.  On 3 L nasal cannula. Cardiovascular system: S1 & S2 heard, RRR. No JVD, murmurs, rubs, gallops or clicks. No pedal edema. Gastrointestinal system: Abdomen is nondistended, soft and nontender. No organomegaly or masses felt. Normal bowel sounds heard. Central nervous system: Alert and oriented. No focal neurological deficits. Extremities: Symmetric 5 x 5 power. Skin: No rashes, lesions or ulcers Psychiatry: Judgement and insight appear normal. Mood & affect appropriate.     Data Reviewed: I have personally reviewed following labs and imaging studies  CBC: Recent Labs  Lab 03/24/19 1232 03/25/19 0504 03/29/19 0450  WBC 7.5 6.6 6.5  NEUTROABS 5.4  --   --   HGB 9.6* 9.5* 9.6*  HCT 31.1* 30.2* 32.2*  MCV 85.0 83.4 85.9  PLT 289 321 360   Basic Metabolic Panel: Recent Labs  Lab 03/24/19 1232 03/25/19 0504 03/26/19 0615 03/29/19 0450  NA 134* 133* 133* 136  K 4.3 4.5 4.1 3.8  CL 97* 99 98 101  CO2 GLUCOSE 87 87 144* 87  BUN 5* CREATININE 0.49* 0.55* 0.55* 0.45*  CALCIUM 8.6* 8.3* 8.3* 8.3*   GFR: Estimated Creatinine Clearance: 91 mL/min (A) (by C-G formula based on SCr of 0.45 mg/dL (L)). Liver Function Tests: Recent Labs  Lab 03/24/19 1232  AST 45*  ALT 78*  ALKPHOS 431*  BILITOT 0.3  PROT 6.1*  ALBUMIN 2.4*   No results for input(s): LIPASE, AMYLASE in the last 168 hours. No results for input(s): AMMONIA in the last 168 hours. Coagulation Profile: No results for input(s): INR, PROTIME in the last 168 hours. Cardiac Enzymes: Recent Labs  Lab 03/24/19 1232  TROPONINI <0.03   BNP (last 3 results) No results for input(s): PROBNP in the last  8760 hours. HbA1C: No results for input(s): HGBA1C in the last 72 hours. CBG: No results for input(s): GLUCAP in the last 168 hours. Lipid Profile: No results for input(s): CHOL, HDL, LDLCALC, TRIG, CHOLHDL, LDLDIRECT in the last 72 hours. Thyroid Function Tests: No results for input(s): TSH, T4TOTAL, FREET4, T3FREE, THYROIDAB in the last 72 hours. Anemia Panel: No results for input(s): VITAMINB12, FOLATE, FERRITIN, TIBC, IRON, RETICCTPCT in the last 72 hours. Sepsis Labs: Recent Labs  Lab 03/24/19 1232 03/24/19 1418  PROCALCITON <0.10  --   LATICACIDVEN 1.0 1.3    Recent Results (from the past 240 hour(s))  Culture, blood (routine x 2)     Status: None   Collection Time: 03/24/19 12:40 PM  Result Value Ref Range Status   Specimen Description RIGHT ANTECUBITAL  Final   Special Requests   Final    BOTTLES DRAWN AEROBIC AND ANAEROBIC Blood Culture adequate volume   Culture   Final    NO GROWTH 5 DAYS Performed at [facility], [address] [place], [place] [address]    Report Status 03/29/2019 FINAL  Final  Culture, blood (routine x 2)     Status: None   Collection Time: 03/24/19 12:44 PM  Result Value Ref Range Status   Specimen Description BLOOD RIGHT ARM  Final   Special Requests   Final    BOTTLES DRAWN AEROBIC AND ANAEROBIC Blood Culture adequate volume   Culture   Final    NO GROWTH 5 DAYS Performed at [facility], [address], [place], [place] [address]    Report Status 03/29/2019 FINAL  Final  MRSA PCR Screening     Status: None   Collection Time: 03/24/19  2:10 PM  Result Value Ref Range Status   MRSA by PCR NEGATIVE NEGATIVE Final    Comment:        The GeneXpert MRSA Assay (FDA approved for NASAL specimens only), is one component of a comprehensive MRSA colonization surveillance program. It is not intended to diagnose MRSA infection nor to guide or monitor treatment for MRSA infections. Performed at [facility], [address] [place],  [place] [address]   SARS Coronavirus 2 [facility] order, Performed in [facility] hospital lab)     Status: None   Collection Time: 03/24/19  3:24 PM  Result Value Ref Range Status   SARS Coronavirus 2 NEGATIVE NEGATIVE Final    Comment: (NOTE) If result is NEGATIVE SARS-CoV-2 target nucleic acids are NOT DETECTED. The SARS-CoV-2 RNA is generally detectable in upper and lower  respiratory specimens during the acute phase of infection. The lowest  concentration of SARS-CoV-2 viral copies this assay can detect is  250  copies / mL. A negative result does not preclude SARS-CoV-2 infection  and should not be used as the sole basis for treatment or other  patient management decisions.  A negative result may occur with  improper specimen collection / handling, submission of specimen other  than nasopharyngeal swab, presence of viral mutation(s) within the  areas targeted by this assay, and inadequate number of viral copies  (<250 copies / mL). A negative result must be combined with clinical  observations, patient history, and epidemiological information. If result is POSITIVE SARS-CoV-2 target nucleic acids are DETECTED. The SARS-CoV-2 RNA is generally detectable in upper and lower  respiratory specimens dur ing the acute phase of infection.  Positive  results are indicative of active infection with SARS-CoV-2.  Clinical  correlation with patient history and other diagnostic information is  necessary to determine patient infection status.  Positive results do  not rule out bacterial infection or co-infection with other viruses. If result is PRESUMPTIVE POSTIVE SARS-CoV-2 nucleic acids MAY BE PRESENT.   A presumptive positive result was obtained on the submitted specimen  and confirmed on repeat testing.  While 2019 novel coronavirus  (SARS-CoV-2) nucleic acids may be present in the submitted sample  additional confirmatory testing may be necessary for epidemiological  and / or clinical management  purposes  to differentiate between  SARS-CoV-2 and other Sarbecovirus currently known to infect humans.  If clinically indicated additional testing with an alternate test  methodology [phone]([id number]) is advised. The SARS-CoV-2 RNA is generally  detectable in upper and lower respiratory sp ecimens during the acute  phase of infection. The expected result is Negative. Fact Sheet for Patients:  BoilerBrush.com.cyhttps://www.fda.gov/media/136312/download Fact Sheet for Healthcare Providers: https://pope.com/https://www.fda.gov/media/136313/download This test is not yet approved or cleared by the [place] FDA and has been authorized for detection and/or diagnosis of SARS-CoV-2 by FDA under an Emergency Use Authorization (EUA).  This EUA will remain in effect (meaning this test can be used) for the duration of the COVID-19 declaration under Section 564(b)(1) of the Act, 21 U.S.C. section 360bbb-3(b)(1), unless the authorization is terminated or revoked sooner. Performed at [facility], [address] [place], [place] [address]   Respiratory Panel by PCR     Status: None   Collection Time: 03/24/19  4:05 PM  Result Value Ref Range Status   Adenovirus NOT DETECTED NOT DETECTED Final   Coronavirus 229E NOT DETECTED NOT DETECTED Final    Comment: (NOTE) The Coronavirus on the Respiratory Panel, DOES NOT test for the novel  Coronavirus (2019 nCoV)    Coronavirus HKU1 NOT DETECTED NOT DETECTED Final   Coronavirus NL63 NOT DETECTED NOT DETECTED Final   Coronavirus OC43 NOT DETECTED NOT DETECTED Final   Metapneumovirus NOT DETECTED NOT DETECTED Final   Rhinovirus / Enterovirus NOT DETECTED NOT DETECTED Final   Influenza A NOT DETECTED NOT DETECTED Final   Influenza B NOT DETECTED NOT DETECTED Final   Parainfluenza Virus 1 NOT DETECTED NOT DETECTED Final   Parainfluenza Virus 2 NOT DETECTED NOT DETECTED Final   Parainfluenza Virus 3 NOT DETECTED NOT DETECTED Final   Parainfluenza Virus 4 NOT DETECTED NOT DETECTED Final    Respiratory Syncytial Virus NOT DETECTED NOT DETECTED Final   Bordetella pertussis NOT DETECTED NOT DETECTED Final   Chlamydophila pneumoniae NOT DETECTED NOT DETECTED Final   Mycoplasma pneumoniae NOT DETECTED NOT DETECTED Final    Comment: Performed at [facility], [address] [place], [place] [address]  Gram stain  Status: None   Collection Time: 03/27/19  1:48 PM  Result Value Ref Range Status   Specimen Description PLEURAL  Final   Special Requests NONE  Final   Gram Stain   Final    CYTOSPIN SMEAR NO ORGANISMS SEEN WBC PRESENT,BOTH PMN AND MONONUCLEAR Performed at [facility], [address] [place], [place] [address]    Report Status 03/27/2019 FINAL  Final  Culture, body fluid-bottle     Status: None (Preliminary result)   Collection Time: 03/27/19  1:48 PM  Result Value Ref Range Status   Specimen Description PLEURAL  Final   Special Requests BOTTLES DRAWN AEROBIC AND ANAEROBIC 10 CC EACH  Final   Culture   Final    NO GROWTH 2 DAYS Performed at [facility], [address] [place], [place] [address]    Report Status PENDING  Incomplete         Radiology Studies: Dg Chest 1 View  Result Date: 03/27/2019 CLINICAL DATA:  Post LEFT thoracentesis EXAM: CHEST  1 VIEW COMPARISON:  03/24/2019 FINDINGS: Stable heart size mediastinal contours. BILATERAL pulmonary infiltrates, greatest at RIGHT base. No residual LEFT pleural effusion. No pneumothorax following thoracentesis. IMPRESSION: Persistent pulmonary infiltrates. No pneumothorax following LEFT thoracentesis. Electronically Signed   By: Ulyses Southward M.D.   On: 03/27/2019 14:17   US Thoracentesis Asp Pleural Space W/img Guide  Result Date: 03/27/2019 INDICATION: LEFT pleural effusion EXAM: ULTRASOUND GUIDED DIAGNOSTIC AND THERAPEUTIC LEFT THORACENTESIS MEDICATIONS: None. COMPLICATIONS: None immediate. PROCEDURE: Procedure, benefits, and risks of procedure were discussed with patient. Written informed consent  for procedure was obtained. Time out protocol followed. Pleural effusion localized by ultrasound at the posterior LEFT hemithorax. Skin prepped and draped in usual sterile fashion. Skin and soft tissues anesthetized with 10 mL of 1% lidocaine. 8 Pham thoracentesis catheter placed into the LEFT pleural space. 700 mL of serosanguineous LEFT pleural fluid aspirated by syringe pump. Procedure tolerated well by patient without immediate complication. FINDINGS: A total of approximately 700 mL of LEFT pleural fluid was removed. Samples were sent to the laboratory as requested by the clinical team. IMPRESSION: Successful ultrasound guided LEFT thoracentesis yielding 700 mL of pleural fluid. Electronically Signed   By: Ulyses Southward M.D.   On: 03/27/2019 14:16        Scheduled Meds: . albuterol  2.5 mg Nebulization TID  . aspirin EC  81 mg Oral QPM  . atorvastatin  20 mg Oral q1800  . budesonide (PULMICORT) nebulizer solution  0.25 mg Nebulization BID  . diltiazem  180 mg Oral Daily  . divalproex  1,500 mg Oral QHS  . enoxaparin (LOVENOX) injection  40 mg Subcutaneous Q24H  . feeding supplement (ENSURE ENLIVE)  237 mL Oral BID BM  . FLUoxetine  40 mg Oral Daily  . fluticasone  1 spray Each Nare Daily  . folic acid  1 mg Oral Daily  . gabapentin  400 mg Oral Daily   And  . gabapentin  800 mg Oral QHS  . guaiFENesin  600 mg Oral BID  . loratadine  10 mg Oral Daily  . methocarbamol  500 mg Oral TID  . methylPREDNISolone (SOLU-MEDROL) injection  40 mg Intravenous Q12H  . metoprolol tartrate  25 mg Oral BID  . OLANZapine  10 mg Oral QHS  . potassium chloride SA  40 mEq Oral Daily  . thiamine  250 mg Oral Daily  . traZODone  50 mg Oral QHS  . umeclidinium bromide  1 puff Inhalation  Daily   Continuous Infusions: . ceFEPime (MAXIPIME) IV 2 g (03/29/19 0531)  . metronidazole 500 mg (03/29/19 0407)     LOS: 3 days    Time spent: 30 minutes     Hoover Brunette, DO [facility] Pager  [phone]  If 7PM-7AM, please contact night-coverage [email or web address] Password [email or web address] 03/29/2019, 11:23 AM

## 2019-03-29 NOTE — Plan of Care (Signed)
Patient is progressing with care plan. 

## 2019-03-29 NOTE — Care Management Important Message (Signed)
Important Message  Patient Details  Name: Jeffrey [name] MRN: [id number] Date of Birth: 13-Nov-1961   Medicare Important Message Given:  Yes    Corey Harold 03/29/2019, 12:52 PM

## 2019-03-30 MED ORDER — PREDNISONE 20 MG PO TABS: 40.0000 mg | ORAL_TABLET | Freq: Every day | ORAL | 0 refills | Status: AC

## 2019-03-30 MED ORDER — GUAIFENESIN ER 600 MG PO TB12: 600.0000 mg | ORAL_TABLET | Freq: Two times a day (BID) | ORAL | 0 refills | Status: AC

## 2019-03-30 MED ORDER — ENSURE ENLIVE PO LIQD: 237.0000 mL | Freq: Two times a day (BID) | ORAL | 12 refills | Status: DC

## 2019-03-30 NOTE — Discharge Summary (Signed)
Physician Discharge Summary  Jeffrey [name] ZOX:[id number] DOB: Aug 01, 1961 DOA: 03/24/2019  PCP: Gareth Morgan, MD  Admit date: 03/24/2019  Discharge date: 03/30/2019  Admitted From:Home  Disposition:  Home  Recommendations for Outpatient Follow-up:  1. Follow up with PCP in 1-2 weeks 2. Continue on prednisone as prescribed for 5 more days, no further need for antibiotics on discharge as he has completed course of treatment 3. Continue on home oxygen as previous with 5 L nasal cannula  Home Health: None  Equipment/Devices: Has home oxygen  Discharge Condition: Stable  CODE STATUS: Full  Diet recommendation: Heart Healthy  Brief/Interim Summary: Per HPI: 58 y.o.malewith a history of severe COPD, chronic diastolic heart failure, GERD, bipolar disorder with depression. Patient has had 2 hospitalizations: Was hospitalized from 12/16/2020 to 01/13/2019 for Klebsiella pneumonia with acute respiratory failure and intubation. He was admitted on 3/26 for fevers and cough, reintubated on 3/27 and eventually sent home on 03/07/2019. He was sent home on 14 days of Levaquin and a steroid taper. He has completed both of these. The patient has had a fever over the past 2 days, as high as 103. He was seen by the visiting home nurse, who sent him to the hospital for evaluation.  Patient was admitted with fever/bronchiectasis and has been on IV cefepime and Flagyl.  This is in the setting of severe COPD.  He is completed a 5-day course of treatment and has been doing quite well with no further symptoms to report at this time.  His testing for COVID-19 was also negative and respiratory panel was also negative.  He has no further cough and has remained afebrile.  He has ambulated with no significant symptoms on 5 L nasal cannula with saturations near 95%.  He will remain on prednisone taper as prescribed for several more days and does have home nebulizer machine as needed for shortness of breath or  wheezing.  He has been counseled on alcohol cessation prior to discharge as well.  No other acute events noted during the course of this brief admission.  He is otherwise stable for discharge today.  He may benefit from a percussion vest and some chest PT outpatient due to his bronchiectasis to help prevent further recurrent admissions.  Discharge Diagnoses:  Active Problems:   COPD, severe (HCC)   Bipolar disorder (HCC)   Bipolar disorder with severe depression (HCC)   Alcohol use disorder, severe, dependence (HCC)   Essential hypertension   GERD (gastroesophageal reflux disease)   Bronchiectasis (HCC)   Fever  Principal discharge diagnosis: Bronchiectasis with mild COPD exacerbation.  Discharge Instructions  Discharge Instructions    Diet - low sodium heart healthy   Complete by:  As directed    Increase activity slowly   Complete by:  As directed      Allergies as of 03/30/2019      Reactions   Augmentin [amoxicillin-pot Clavulanate] Rash, Other (See Comments)   Has patient had a PCN reaction causing immediate rash, facial/tongue/throat swelling, SOB or lightheadedness with hypotension: No Has patient had a PCN reaction causing severe rash involving mucus membranes or skin necrosis: No Has patient had a PCN reaction that required hospitalization No Has patient had a PCN reaction occurring within the last 10 years: Yes If all of the above answers are "NO", then may proceed with Cephalosporin use.   Ace Inhibitors Hives      Medication List    STOP taking these medications   levofloxacin 500 MG tablet  Commonly known as:  LEVAQUIN   levofloxacin 750 MG tablet Commonly known as:  LEVAQUIN     TAKE these medications   albuterol (2.5 MG/3ML) 0.083% nebulizer solution Commonly known as:  PROVENTIL Take 3 mLs (2.5 mg total) by nebulization every 4 (four) hours as needed for shortness of breath.   albuterol 108 (90 Base) MCG/ACT inhaler Commonly known as:  ProAir  HFA Inhale 2 puffs into the lungs every 4 (four) hours as needed for wheezing or shortness of breath.   aspirin EC 81 MG tablet Take 81 mg by mouth every evening.   atorvastatin 20 MG tablet Commonly known as:  LIPITOR Take 1 tablet (20 mg total) by mouth daily. For high cholesterol   diltiazem 180 MG 24 hr capsule Commonly known as:  CARDIZEM CD Take 1 capsule (180 mg total) by mouth daily.   divalproex 500 MG 24 hr tablet Commonly known as:  DEPAKOTE ER Take 3 tablets (1,500 mg total) by mouth at bedtime.   feeding supplement (ENSURE ENLIVE) Liqd Take 237 mLs by mouth 2 (two) times daily between meals.   FLUoxetine 40 MG capsule Commonly known as:  PROZAC Take 1 capsule (40 mg total) by mouth daily. For mood control   gabapentin 400 MG capsule Commonly known as:  NEURONTIN Take 1 capsule (400 mg total) by mouth 3 (three) times daily. What changed:    how much to take  when to take this  additional instructions   guaiFENesin 600 MG 12 hr tablet Commonly known as:  MUCINEX Take 1 tablet (600 mg total) by mouth 2 (two) times daily for 10 days.   ipratropium 0.02 % nebulizer solution Commonly known as:  ATROVENT Take 2.5 mLs (0.5 mg total) by nebulization 3 (three) times daily.   methocarbamol 500 MG tablet Commonly known as:  ROBAXIN Take 1 tablet by mouth 3 (three) times daily.   metoprolol tartrate 25 MG tablet Commonly known as:  LOPRESSOR Take 1 tablet (25 mg total) by mouth 2 (two) times daily.   multivitamin with minerals Tabs tablet Take 1 tablet by mouth daily.   OLANZapine 10 MG tablet Commonly known as:  ZYPREXA Take 1.5 tablets (15 mg total) by mouth at bedtime. For mood stability What changed:  how much to take   potassium chloride SA 20 MEQ tablet Commonly known as:  K-DUR Take 4 tablets (80 mEq total) by mouth 2 (two) times daily.   predniSONE 20 MG tablet Commonly known as:  Deltasone Take 2 tablets (40 mg total) by mouth daily for 5  days. What changed:  when to take this   Resource ThickenUp Clear Powd Take 120 g by mouth as needed.   thiamine 100 MG tablet Commonly known as:  VITAMIN B-1 Take 1 tablet (100 mg total) by mouth daily. What changed:  how much to take   traZODone 50 MG tablet Commonly known as:  DESYREL Take 1 tablet (50 mg total) by mouth at bedtime as needed for sleep. What changed:  when to take this      Follow-up Information    Gareth Morgan, MD Follow up in 1 week(s).   Specialty:  Family Medicine Contact information: [address] [place] [address] [phone]          Allergies  Allergen Reactions  . Augmentin [Amoxicillin-Pot Clavulanate] Rash and Other (See Comments)    Has patient had a PCN reaction causing immediate rash, facial/tongue/throat swelling, SOB or lightheadedness with hypotension: No Has patient  had a PCN reaction causing severe rash involving mucus membranes or skin necrosis: No Has patient had a PCN reaction that required hospitalization No Has patient had a PCN reaction occurring within the last 10 years: Yes If all of the above answers are "NO", then may proceed with Cephalosporin use.  Donivan Scull Inhibitors Hives    Consultations:  None   Procedures/Studies: Dg Chest 1 View  Result Date: 03/27/2019 CLINICAL DATA:  Post LEFT thoracentesis EXAM: CHEST  1 VIEW COMPARISON:  03/24/2019 FINDINGS: Stable heart size mediastinal contours. BILATERAL pulmonary infiltrates, greatest at RIGHT base. No residual LEFT pleural effusion. No pneumothorax following thoracentesis. IMPRESSION: Persistent pulmonary infiltrates. No pneumothorax following LEFT thoracentesis. Electronically Signed   By: Ulyses Southward M.D.   On: 03/27/2019 14:17   Dg Shoulder Right  Result Date: 02/28/2019 CLINICAL DATA:  Right shoulder pain and limited range of motion. History of prior surgery. EXAM: RIGHT SHOULDER - 2+ VIEW COMPARISON:  MRI right shoulder 05/05/2004. FINDINGS: The  acromioclavicular joint has been debrided. The glenohumeral joint appears normal. No acute bony or joint abnormality is identified. Extensive airspace disease is seen in the visualized right chest. Note is made the patient has a chest CT scan today. IMPRESSION: Status post debridement of the right acromioclavicular joint. The right shoulder otherwise appears normal. Extensive airspace disease in the right chest. Please see report of dedicated chest CT this same day. Electronically Signed   By: Drusilla Kanner M.D.   On: 02/28/2019 16:13   Dg Abd 1 View  Result Date: 03/03/2019 CLINICAL DATA:  Initial evaluation for NG tube placement EXAM: ABDOMEN - 1 VIEW COMPARISON:  Prior radiograph from 02/17/2019 FINDINGS: Enteric tube in place with tip overlying the proximal stomach, side hole in the distal esophagus. Few nondilated gas-filled loops of bowel noted within the left and lower abdomen. No acute intra-abdominal process. Probable small left pleural effusion. Associated left basilar opacity could reflect atelectasis or infiltrate. IMPRESSION: 1. Tip of enteric tube overlying the proximal stomach, side hole in the distal esophagus. Consider advancement by approximately 7 cm to insure adequate placement within the stomach. 2. Small left pleural effusion with associated left basilar opacity, which could reflect atelectasis or infiltrate. Electronically Signed   By: Rise Mu M.D.   On: 03/03/2019 23:48   Ct Chest Wo Contrast  Result Date: 02/28/2019 CLINICAL DATA:  Shortness of breath with fever and chills beginning 1 week ago and diagnosed with pneumonia. Worsening respiratory status. COVID-19 negative twice. EXAM: CT CHEST WITHOUT CONTRAST TECHNIQUE: Multidetector CT imaging of the chest was performed following the standard protocol without IV contrast. COMPARISON:  01/04/2018 FINDINGS: Cardiovascular: Right IJ central venous catheter has tip over the SVC. Heart is normal size. Minimal calcified  plaque over the left anterior descending coronary artery. Minimal calcified plaque over the aortic arch. Remaining vascular structures are unremarkable. Mediastinum/Nodes: 1 cm prevascular lymph node. 1.2 cm precarinal lymph node and 1.5 cm subcarinal lymph node likely reactive. Remaining mediastinal structures are unremarkable. Lungs/Pleura: Lungs are adequately inflated and demonstrate moderate centrilobular emphysematous disease. There is patchy bilateral airspace opacification worse over the left upper lobe/lingula. Small to moderate size bilateral pleural effusions are present. These findings are all new since the previous exam. Peripheral airways are unremarkable. There is aspirate material over the dependent trachea. Upper Abdomen: No acute findings. Musculoskeletal: Minimal degenerative change of the spine. IMPRESSION: Bilateral multifocal airspace process most prominent over the left upper lobe/lingula likely multifocal pneumonia. Small to moderate bilateral pleural effusions  with mild right basilar atelectasis. These findings are new since 01/04/2018. Mild reactive mediastinal adenopathy. Findings could be secondary to aspiration as there is aspirate material over the trachea. Aortic Atherosclerosis (ICD10-I70.0). Minimal atherosclerotic coronary artery disease. Electronically Signed   By: Elberta Fortis M.D.   On: 02/28/2019 16:16   Ct Chest W Contrast  Result Date: 03/26/2019 CLINICAL DATA:  58 year old with empyema.  History of pneumonia. EXAM: CT CHEST WITH CONTRAST TECHNIQUE: Multidetector CT imaging of the chest was performed during intravenous contrast administration. CONTRAST:  75mL OMNIPAQUE IOHEXOL 300 MG/ML  SOLN COMPARISON:  Chest radiograms 03/24/2019. Chest CT 02/28/2019 and chest CT from 01/04/2018 FINDINGS: Cardiovascular: Atherosclerotic calcifications in the thoracic aorta. No evidence for aneurysm. Main and central pulmonary arteries are patent. Heart size is normal without significant  pericardial fluid. Mediastinum/Nodes: Evidence for mild mediastinal and hilar lymphadenopathy similar to the previous examination. Index lymph node in the pre carinal space measures 1.3 cm in short axis and stable. Left hilar node measures 1.2 cm in short axis on sequence 3, image 77. No axillary lymph node enlargement. Small supraclavicular lymph nodes are similar to the prior examination. Lungs/Pleura: Small bilateral pleural effusions have slightly decreased in size. The left pleural fluid appears to be loculated along the posterior left upper chest and at the left lung base. Right pleural fluid appears to be slightly complex along the upper portion of the right major fissure. The trachea and mainstem bronchi are patent. Severe centrilobular emphysema. The airspace disease in the right upper lung has improved since 02/28/2019. Small amount of residual disease in the medial right upper lobe. There is also decreased airspace disease in the right middle lobe. However, there is markedly increased interstitial and airspace densities in the right lower lobe. Again noted are patchy areas of consolidation and airspace disease in the left lower lobe but the distribution has changed since 02/28/2019. Areas along the lateral left lower lobe have resolved and there are new areas of disease in the medial left lower lobe. Airspace disease in left upper lung has markedly improved but now there is a focal bandlike area of consolidation in left upper lobe which has formed adjacent to the complex left pleural fluid. Residual ground-glass and airspace densities in the left upper lobe. Negative for a large pneumothorax. Upper Abdomen: Images of the upper abdomen are unremarkable. Musculoskeletal: No acute bone abnormality. IMPRESSION: 1. Persistent bilateral parenchymal lung disease. The airspace disease in the upper lungs has decreased since 02/28/2019 but there has clearly been progression of disease in the right lower lobe and  the distribution of disease in the left lower lobe has changed. Findings are compatible with acute on chronic disease. 2. Interval development of bandlike consolidation in left upper lobe adjacent to the complex left pleural fluid. 3. Bilateral pleural effusions. Overall, pleural fluid volume has decreased since 02/28/2019. Pleural fluid appears to be complex or loculated. 4. Minimal change in the mediastinal and hilar lymphadenopathy. Lymphadenopathy is likely reactive. 5. Aortic Atherosclerosis (ICD10-I70.0) and Emphysema (ICD10-J43.9). Electronically Signed   By: Richarda Overlie M.D.   On: 03/26/2019 11:21   Dg Chest Portable 1 View  Result Date: 03/24/2019 CLINICAL DATA:  Fever with recent hospitalization for pneumonia. COPD. EXAM: PORTABLE CHEST 1 VIEW COMPARISON:  03/06/2019, 02/26/2019 and CT 02/28/2019 FINDINGS: Lungs are adequately inflated demonstrate persistent bilateral airspace process over the left upper lung and right mid to lower lung. There is improved aeration over the mid to upper lungs and worsening opacification over the  right base. No effusion. Cardiomediastinal silhouette and remainder of the exam is unchanged. IMPRESSION: Persistent multifocal airspace process likely pneumonia improved in the mid to upper lungs are worse in the right base. Electronically Signed   By: Elberta Fortis M.D.   On: 03/24/2019 13:06   Dg Chest Port 1 View  Result Date: 03/06/2019 CLINICAL DATA:  Respiratory failure. EXAM: PORTABLE CHEST 1 VIEW COMPARISON:  One-view chest x-ray 03/03/2019 FINDINGS: Heart size is normal. Left upper lobe pneumonia is not significantly changed. There is some proven of right upper lobe airspace disease. Right basilar airspace disease is slightly improved as well. Left greater than right pleural effusions are again noted. No new airspace disease is present. Patient has been extubated. IMPRESSION: 1. Improving of right-sided airspace disease in the setting of COPD. 2. Persistent left  upper lobe pneumonia. Electronically Signed   By: Marin Roberts M.D.   On: 03/06/2019 05:28   Portable Chest X-ray  Result Date: 03/03/2019 CLINICAL DATA:  Status post intubation. EXAM: PORTABLE CHEST 1 VIEW COMPARISON:  Radiograph of February 26, 2019.  CT scan of February 28, 2019. FINDINGS: Stable cardiomediastinal silhouette. Endotracheal tube is seen projected over tracheal air shadow with distal tip 2 cm above the carina. No pneumothorax or pleural effusion is noted. Stable left upper lobe and right perihilar opacities are noted concerning for multifocal pneumonia. Bony thorax is unremarkable. IMPRESSION: Endotracheal tube in grossly good position. Stable bilateral lung opacities are noted most consistent with multifocal pneumonia. Electronically Signed   By: Lupita Raider, M.D.   On: 03/03/2019 19:10   US Thoracentesis Asp Pleural Space W/img Guide  Result Date: 03/27/2019 INDICATION: LEFT pleural effusion EXAM: ULTRASOUND GUIDED DIAGNOSTIC AND THERAPEUTIC LEFT THORACENTESIS MEDICATIONS: None. COMPLICATIONS: None immediate. PROCEDURE: Procedure, benefits, and risks of procedure were discussed with patient. Written informed consent for procedure was obtained. Time out protocol followed. Pleural effusion localized by ultrasound at the posterior LEFT hemithorax. Skin prepped and draped in usual sterile fashion. Skin and soft tissues anesthetized with 10 mL of 1% lidocaine. 8 Hubble thoracentesis catheter placed into the LEFT pleural space. 700 mL of serosanguineous LEFT pleural fluid aspirated by syringe pump. Procedure tolerated well by patient without immediate complication. FINDINGS: A total of approximately 700 mL of LEFT pleural fluid was removed. Samples were sent to the laboratory as requested by the clinical team. IMPRESSION: Successful ultrasound guided LEFT thoracentesis yielding 700 mL of pleural fluid. Electronically Signed   By: Ulyses Southward M.D.   On: 03/27/2019 14:16     Discharge  Exam: Vitals:   03/30/19 0838 03/30/19 0947  BP:  108/65  Pulse:  92  Resp:    Temp:    SpO2: 100%    Vitals:   03/30/19 0831 03/30/19 0834 03/30/19 0838 03/30/19 0947  BP:    108/65  Pulse:    92  Resp:      Temp:      TempSrc:      SpO2: 95% 100% 100%   Weight:      Height:        General: Pt is alert, awake, not in acute distress Cardiovascular: RRR, S1/S2 +, no rubs, no gallops Respiratory: CTA bilaterally, no wheezing, no rhonchi, on 5 L nasal cannula Abdominal: Soft, NT, ND, bowel sounds + Extremities: no edema, no cyanosis    The results of significant diagnostics from this hospitalization (including imaging, microbiology, ancillary and laboratory) are listed below for reference.     Microbiology: Recent Results (  from the past 240 hour(s))  Culture, blood (routine x 2)     Status: None   Collection Time: 03/24/19 12:40 PM  Result Value Ref Range Status   Specimen Description RIGHT ANTECUBITAL  Final   Special Requests   Final    BOTTLES DRAWN AEROBIC AND ANAEROBIC Blood Culture adequate volume   Culture   Final    NO GROWTH 5 DAYS Performed at [facility], [address], [place], [place] [address]    Report Status 03/29/2019 FINAL  Final  Culture, blood (routine x 2)     Status: None   Collection Time: 03/24/19 12:44 PM  Result Value Ref Range Status   Specimen Description BLOOD RIGHT ARM  Final   Special Requests   Final    BOTTLES DRAWN AEROBIC AND ANAEROBIC Blood Culture adequate volume   Culture   Final    NO GROWTH 5 DAYS Performed at [facility], [address], [place], [place] [address]    Report Status 03/29/2019 FINAL  Final  MRSA PCR Screening     Status: None   Collection Time: 03/24/19  2:10 PM  Result Value Ref Range Status   MRSA by PCR NEGATIVE NEGATIVE Final    Comment:        The GeneXpert MRSA Assay (FDA approved for NASAL specimens only), is one component of a comprehensive MRSA colonization surveillance program. It is  not intended to diagnose MRSA infection nor to guide or monitor treatment for MRSA infections. Performed at [facility], [address] [place], [place] [address]   SARS Coronavirus 2 [facility] order, Performed in [facility] hospital lab)     Status: None   Collection Time: 03/24/19  3:24 PM  Result Value Ref Range Status   SARS Coronavirus 2 NEGATIVE NEGATIVE Final    Comment: (NOTE) If result is NEGATIVE SARS-CoV-2 target nucleic acids are NOT DETECTED. The SARS-CoV-2 RNA is generally detectable in upper and lower  respiratory specimens during the acute phase of infection. The lowest  concentration of SARS-CoV-2 viral copies this assay can detect is 250  copies / mL. A negative result does not preclude SARS-CoV-2 infection  and should not be used as the sole basis for treatment or other  patient management decisions.  A negative result may occur with  improper specimen collection / handling, submission of specimen other  than nasopharyngeal swab, presence of viral mutation(s) within the  areas targeted by this assay, and inadequate number of viral copies  (<250 copies / mL). A negative result must be combined with clinical  observations, patient history, and epidemiological information. If result is POSITIVE SARS-CoV-2 target nucleic acids are DETECTED. The SARS-CoV-2 RNA is generally detectable in upper and lower  respiratory specimens dur ing the acute phase of infection.  Positive  results are indicative of active infection with SARS-CoV-2.  Clinical  correlation with patient history and other diagnostic information is  necessary to determine patient infection status.  Positive results do  not rule out bacterial infection or co-infection with other viruses. If result is PRESUMPTIVE POSTIVE SARS-CoV-2 nucleic acids MAY BE PRESENT.   A presumptive positive result was obtained on the submitted specimen  and confirmed on repeat testing.  While 2019 novel coronavirus   (SARS-CoV-2) nucleic acids may be present in the submitted sample  additional confirmatory testing may be necessary for epidemiological  and / or clinical management purposes  to differentiate between  SARS-CoV-2 and other Sarbecovirus currently known to infect humans.  If  clinically indicated additional testing with an alternate test  methodology [phone]([id number]) is advised. The SARS-CoV-2 RNA is generally  detectable in upper and lower respiratory sp ecimens during the acute  phase of infection. The expected result is Negative. Fact Sheet for Patients:  BoilerBrush.com.cyhttps://www.fda.gov/media/136312/download Fact Sheet for Healthcare Providers: https://pope.com/https://www.fda.gov/media/136313/download This test is not yet approved or cleared by the [place] FDA and has been authorized for detection and/or diagnosis of SARS-CoV-2 by FDA under an Emergency Use Authorization (EUA).  This EUA will remain in effect (meaning this test can be used) for the duration of the COVID-19 declaration under Section 564(b)(1) of the Act, 21 U.S.C. section 360bbb-3(b)(1), unless the authorization is terminated or revoked sooner. Performed at [facility], [address] [place], [place] [address]   Respiratory Panel by PCR     Status: None   Collection Time: 03/24/19  4:05 PM  Result Value Ref Range Status   Adenovirus NOT DETECTED NOT DETECTED Final   Coronavirus 229E NOT DETECTED NOT DETECTED Final    Comment: (NOTE) The Coronavirus on the Respiratory Panel, DOES NOT test for the novel  Coronavirus (2019 nCoV)    Coronavirus HKU1 NOT DETECTED NOT DETECTED Final   Coronavirus NL63 NOT DETECTED NOT DETECTED Final   Coronavirus OC43 NOT DETECTED NOT DETECTED Final   Metapneumovirus NOT DETECTED NOT DETECTED Final   Rhinovirus / Enterovirus NOT DETECTED NOT DETECTED Final   Influenza A NOT DETECTED NOT DETECTED Final   Influenza B NOT DETECTED NOT DETECTED Final   Parainfluenza Virus 1 NOT DETECTED NOT DETECTED Final    Parainfluenza Virus 2 NOT DETECTED NOT DETECTED Final   Parainfluenza Virus 3 NOT DETECTED NOT DETECTED Final   Parainfluenza Virus 4 NOT DETECTED NOT DETECTED Final   Respiratory Syncytial Virus NOT DETECTED NOT DETECTED Final   Bordetella pertussis NOT DETECTED NOT DETECTED Final   Chlamydophila pneumoniae NOT DETECTED NOT DETECTED Final   Mycoplasma pneumoniae NOT DETECTED NOT DETECTED Final    Comment: Performed at [facility], [address] [place], [place] [address]  Gram stain     Status: None   Collection Time: 03/27/19  1:48 PM  Result Value Ref Range Status   Specimen Description PLEURAL  Final   Special Requests NONE  Final   Gram Stain   Final    CYTOSPIN SMEAR NO ORGANISMS SEEN WBC PRESENT,BOTH PMN AND MONONUCLEAR Performed at [facility], [address] [place], [place] [address]    Report Status 03/27/2019 FINAL  Final  Culture, body fluid-bottle     Status: None (Preliminary result)   Collection Time: 03/27/19  1:48 PM  Result Value Ref Range Status   Specimen Description PLEURAL  Final   Special Requests BOTTLES DRAWN AEROBIC AND ANAEROBIC 10 CC EACH  Final   Culture   Final    NO GROWTH 3 DAYS Performed at [facility], [address] [place], [place] [address]    Report Status PENDING  Incomplete  Fungus Culture With Stain     Status: None (Preliminary result)   Collection Time: 03/27/19  1:49 PM  Result Value Ref Range Status   Fungus Stain Final report  Final    Comment: (NOTE) Performed At: [facility] [address] [place], [place] [id number] Jolene SchimkeNagendra Sanjai MD GE:[id number]:[phone]    Fungus (Mycology) Culture PENDING  Incomplete   Fungal Source PLEURAL  Final    Comment: Performed at [facility], [address] [place], [place] [address]  Fungus Culture Result  Status: None   Collection Time: 03/27/19  1:49 PM  Result Value Ref Range Status   Result 1 Comment  Final    Comment: (NOTE) KOH/Calcofluor preparation:  no fungus  observed. Performed At: [facility] [address] [place], [place] [id number] Jolene Schimke MD WU:[id number]      Labs: BNP (last 3 results) Recent Labs    06/16/18 0151 09/16/18 1049  BNP 315.0* 407.0*   Basic Metabolic Panel: Recent Labs  Lab 03/24/19 1232 03/25/19 0504 03/26/19 0615 03/29/19 0450  NA 134* 133* 133* 136  K 4.3 4.5 4.1 3.8  CL 97* 99 98 101  CO2 GLUCOSE 87 87 144* 87  BUN 5* CREATININE 0.49* 0.55* 0.55* 0.45*  CALCIUM 8.6* 8.3* 8.3* 8.3*   Liver Function Tests: Recent Labs  Lab 03/24/19 1232  AST 45*  ALT 78*  ALKPHOS 431*  BILITOT 0.3  PROT 6.1*  ALBUMIN 2.4*   No results for input(s): LIPASE, AMYLASE in the last 168 hours. No results for input(s): AMMONIA in the last 168 hours. CBC: Recent Labs  Lab 03/24/19 1232 03/25/19 0504 03/29/19 0450  WBC 7.5 6.6 6.5  NEUTROABS 5.4  --   --   HGB 9.6* 9.5* 9.6*  HCT 31.1* 30.2* 32.2*  MCV 85.0 83.4 85.9  PLT 289 321 360   Cardiac Enzymes: Recent Labs  Lab 03/24/19 1232  TROPONINI <0.03   BNP: Invalid input(s): POCBNP CBG: No results for input(s): GLUCAP in the last 168 hours. D-Dimer No results for input(s): DDIMER in the last 72 hours. Hgb A1c No results for input(s): HGBA1C in the last 72 hours. Lipid Profile No results for input(s): CHOL, HDL, LDLCALC, TRIG, CHOLHDL, LDLDIRECT in the last 72 hours. Thyroid function studies No results for input(s): TSH, T4TOTAL, T3FREE, THYROIDAB in the last 72 hours.  Invalid input(s): FREET3 Anemia work up No results for input(s): VITAMINB12, FOLATE, FERRITIN, TIBC, IRON, RETICCTPCT in the last 72 hours. Urinalysis    Component Value Date/Time   COLORURINE YELLOW 03/24/2019 1215   APPEARANCEUR CLEAR 03/24/2019 1215   LABSPEC 1.008 03/24/2019 1215   PHURINE 7.0 03/24/2019 1215   GLUCOSEU NEGATIVE 03/24/2019 1215   HGBUR NEGATIVE 03/24/2019 1215   BILIRUBINUR NEGATIVE 03/24/2019 1215   KETONESUR  NEGATIVE 03/24/2019 1215   PROTEINUR NEGATIVE 03/24/2019 1215   UROBILINOGEN 0.2 03/01/2011 1427   NITRITE NEGATIVE 03/24/2019 1215   LEUKOCYTESUR NEGATIVE 03/24/2019 1215   Sepsis Labs Invalid input(s): PROCALCITONIN,  WBC,  LACTICIDVEN Microbiology Recent Results (from the past 240 hour(s))  Culture, blood (routine x 2)     Status: None   Collection Time: 03/24/19 12:40 PM  Result Value Ref Range Status   Specimen Description RIGHT ANTECUBITAL  Final   Special Requests   Final    BOTTLES DRAWN AEROBIC AND ANAEROBIC Blood Culture adequate volume   Culture   Final    NO GROWTH 5 DAYS Performed at [facility], [address], [place], [place] [address]    Report Status 03/29/2019 FINAL  Final  Culture, blood (routine x 2)     Status: None   Collection Time: 03/24/19 12:44 PM  Result Value Ref Range Status   Specimen Description BLOOD RIGHT ARM  Final   Special Requests   Final    BOTTLES DRAWN AEROBIC AND ANAEROBIC Blood Culture adequate volume   Culture   Final    NO GROWTH 5 DAYS Performed at [facility], [address], [place], [place] [address]    Report Status 03/29/2019 FINAL  Final  MRSA PCR Screening     Status: None   Collection Time: 03/24/19  2:10 PM  Result Value Ref Range Status   MRSA by PCR NEGATIVE NEGATIVE Final    Comment:        The GeneXpert MRSA Assay (FDA approved for NASAL specimens only), is one component of a comprehensive MRSA colonization surveillance program. It is not intended to diagnose MRSA infection nor to guide or monitor treatment for MRSA infections. Performed at [facility], [address], [place], [place] [address]   SARS Coronavirus 2 [facility] order, Performed in [facility] hospital lab)     Status: None   Collection Time: 03/24/19  3:24 PM  Result Value Ref Range Status   SARS Coronavirus 2 NEGATIVE NEGATIVE Final    Comment: (NOTE) If result is NEGATIVE SARS-CoV-2 target nucleic acids are NOT DETECTED. The SARS-CoV-2  RNA is generally detectable in upper and lower  respiratory specimens during the acute phase of infection. The lowest  concentration of SARS-CoV-2 viral copies this assay can detect is 250  copies / mL. A negative result does not preclude SARS-CoV-2 infection  and should not be used as the sole basis for treatment or other  patient management decisions.  A negative result may occur with  improper specimen collection / handling, submission of specimen other  than nasopharyngeal swab, presence of viral mutation(s) within the  areas targeted by this assay, and inadequate number of viral copies  (<250 copies / mL). A negative result must be combined with clinical  observations, patient history, and epidemiological information. If result is POSITIVE SARS-CoV-2 target nucleic acids are DETECTED. The SARS-CoV-2 RNA is generally detectable in upper and lower  respiratory specimens dur ing the acute phase of infection.  Positive  results are indicative of active infection with SARS-CoV-2.  Clinical  correlation with patient history and other diagnostic information is  necessary to determine patient infection status.  Positive results do  not rule out bacterial infection or co-infection with other viruses. If result is PRESUMPTIVE POSTIVE SARS-CoV-2 nucleic acids MAY BE PRESENT.   A presumptive positive result was obtained on the submitted specimen  and confirmed on repeat testing.  While 2019 novel coronavirus  (SARS-CoV-2) nucleic acids may be present in the submitted sample  additional confirmatory testing may be necessary for epidemiological  and / or clinical management purposes  to differentiate between  SARS-CoV-2 and other Sarbecovirus currently known to infect humans.  If clinically indicated additional testing with an alternate test  methodology [phone]) is advised. The SARS-CoV-2 RNA is generally  detectable in upper and lower respiratory sp ecimens during the acute  phase of  infection. The expected result is Negative. Fact Sheet for Patients:  BoilerBrush.com.cy Fact Sheet for Healthcare Providers: https://pope.com/ This test is not yet approved or cleared by the [place] FDA and has been authorized for detection and/or diagnosis of SARS-CoV-2 by FDA under an Emergency Use Authorization (EUA).  This EUA will remain in effect (meaning this test can be used) for the duration of the COVID-19 declaration under Section 564(b)(1) of the Act, 21 U.S.C. section 360bbb-3(b)(1), unless the authorization is terminated or revoked sooner. Performed at [facility], [address], [place], [place] [address]   Respiratory Panel by PCR     Status: None   Collection Time: 03/24/19  4:05 PM  Result Value Ref Range Status   Adenovirus NOT DETECTED NOT  DETECTED Final   Coronavirus 229E NOT DETECTED NOT DETECTED Final    Comment: (NOTE) The Coronavirus on the Respiratory Panel, DOES NOT test for the novel  Coronavirus (2019 nCoV)    Coronavirus HKU1 NOT DETECTED NOT DETECTED Final   Coronavirus NL63 NOT DETECTED NOT DETECTED Final   Coronavirus OC43 NOT DETECTED NOT DETECTED Final   Metapneumovirus NOT DETECTED NOT DETECTED Final   Rhinovirus / Enterovirus NOT DETECTED NOT DETECTED Final   Influenza A NOT DETECTED NOT DETECTED Final   Influenza B NOT DETECTED NOT DETECTED Final   Parainfluenza Virus 1 NOT DETECTED NOT DETECTED Final   Parainfluenza Virus 2 NOT DETECTED NOT DETECTED Final   Parainfluenza Virus 3 NOT DETECTED NOT DETECTED Final   Parainfluenza Virus 4 NOT DETECTED NOT DETECTED Final   Respiratory Syncytial Virus NOT DETECTED NOT DETECTED Final   Bordetella pertussis NOT DETECTED NOT DETECTED Final   Chlamydophila pneumoniae NOT DETECTED NOT DETECTED Final   Mycoplasma pneumoniae NOT DETECTED NOT DETECTED Final    Comment: Performed at [facility], [address] [place], [place] [address]  Gram  stain     Status: None   Collection Time: 03/27/19  1:48 PM  Result Value Ref Range Status   Specimen Description PLEURAL  Final   Special Requests NONE  Final   Gram Stain   Final    CYTOSPIN SMEAR NO ORGANISMS SEEN WBC PRESENT,BOTH PMN AND MONONUCLEAR Performed at [facility], [address], [place], [place] [address]    Report Status 03/27/2019 FINAL  Final  Culture, body fluid-bottle     Status: None (Preliminary result)   Collection Time: 03/27/19  1:48 PM  Result Value Ref Range Status   Specimen Description PLEURAL  Final   Special Requests BOTTLES DRAWN AEROBIC AND ANAEROBIC 10 CC EACH  Final   Culture   Final    NO GROWTH 3 DAYS Performed at [facility], [address] [place], [place] [address]    Report Status PENDING  Incomplete  Fungus Culture With Stain     Status: None (Preliminary result)   Collection Time: 03/27/19  1:49 PM  Result Value Ref Range Status   Fungus Stain Final report  Final    Comment: (NOTE) Performed At: [facility] [address] [place], [place] [id number] Jolene Schimke MD QM:[id number]    Fungus (Mycology) Culture PENDING  Incomplete   Fungal Source PLEURAL  Final    Comment: Performed at [facility], [address] [place], [place] [address]  Fungus Culture Result     Status: None   Collection Time: 03/27/19  1:49 PM  Result Value Ref Range Status   Result 1 Comment  Final    Comment: (NOTE) KOH/Calcofluor preparation:  no fungus observed. Performed At: [facility] [address] [place], [place] [id number] Jolene Schimke MD ZD:[id number]      Time coordinating discharge: 35 minutes  SIGNED:   Erick Blinks, DO [facility] 03/30/2019, 10:35 AM   If 7PM-7AM, please contact night-coverage [email or web address] Password [email or web address]

## 2019-03-30 NOTE — TOC Transition Note (Signed)
Transition of Care [facility]) - CM/SW Discharge Note   Patient Details  Name: Jeffrey [name] MRN: [id number] Date of Birth: [birth year]  [facility] of [facility] CM/SW Contact:  Kiesha Ensey, Chrystine Oiler, RN Phone Number: 03/30/2019, 11:01 AM   Clinical Narrative:   Patient discharging home today. Will resume home health services with [facility]. Bonita Quin of [facility] notified. Patient's PCP closed today. Patient plans to call tomorrow for f/u appt within 1 week. Family to bring portable tank for DC today.   Expected Discharge Plan: Home w Home Health Services Barriers to Discharge: No Barriers Identified    Expected Discharge Plan and Services Expected Discharge Plan: Home w Home Health Services   Discharge Planning Services: CM Consult Post Acute Care Choice: Home Health, Resumption of Svcs/PTA Provider Living arrangements for the past 2 months: Single Family Home Expected Discharge Date: 03/30/19                           [facility] Agency: [facility]        Prior Living Arrangements/Services Living arrangements for the past 2 months: Single Family Home Lives with:: Self, Adult Children, Minor Children   Do you feel safe going back to the place where you live?: Yes      Need for Family Participation in Patient Care: Yes (Comment) Care giver support system in place?: Yes (comment) Current home services: Home PT, Home RN Criminal Activity/Legal Involvement Pertinent to Current Situation/Hospitalization: No - Comment as needed  Activities of Daily Living Home Assistive Devices/Equipment: Scales, Oxygen, Dentures (specify type), Nebulizer ADL Screening (condition at time of admission) Patient's cognitive ability adequate to safely complete daily activities?: Yes Is the patient deaf or have difficulty hearing?: No Does the patient have difficulty seeing, even when wearing glasses/contacts?: No Does the patient have difficulty concentrating, remembering, or  making decisions?: No Patient able to express need for assistance with ADLs?: Yes Does the patient have difficulty dressing or bathing?: No Independently performs ADLs?: Yes (appropriate for developmental age) Does the patient have difficulty walking or climbing stairs?: No Weakness of Legs: None Weakness of Arms/Hands: None           Admission diagnosis:  Abnormal Labs Patient Active Problem List   Diagnosis Date Noted  . Bronchiectasis (HCC) 03/24/2019  . Fever 03/24/2019  . Cough   . Dysphagia   . Palliative care by specialist   . Goals of care, counseling/discussion   . Hepatic encephalopathy (HCC)   . COPD exacerbation (HCC) 09/16/2018  . Essential hypertension 09/16/2018  . GERD (gastroesophageal reflux disease) 09/16/2018  . Hypoxia   . Chronic diastolic CHF (congestive heart failure) (HCC)   . Bipolar disorder with severe depression (HCC) 02/01/2018  . Alcohol use disorder, severe, dependence (HCC) 02/01/2018  . Cannabis use disorder, severe, dependence (HCC) 02/01/2018  . MDD (major depressive disorder), recurrent episode, severe (HCC) 01/31/2018  . Internal and external bleeding hemorrhoids   . Rectal bleeding   . Iron deficiency anemia due to chronic blood loss 03/09/2017  . Anemia   . GIB (gastrointestinal bleeding) 02/07/2017  . Peptic ulcer disease   . Bipolar disorder (HCC)   . 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 H/O: depression 08/26/2016  . Dyslipidemia 08/26/2016  . Tobacco use disorder 08/26/2016  . [name] (peripheral vascular disease) (HCC) 06/18/2015  . Chronic diarrhea 05/02/2013  . Hematochezia 05/02/2013  . Abnormal weight loss 05/02/2013  . Abdominal pain, periumbilical 05/02/2013  PCP:  Gareth Morgan, MD Pharmacy:   [facility] [address] [place] - [address] [place] [address] [place] [address] [place] [address] Phone: [phone] Fax: [phone]  [facility] [place], [place] - [address] [facility] [facility] [address]  [employer]. [address] [place] [address] Phone: [phone] Fax: [phone]      Readmission Risk Interventions Readmission Risk Prevention Plan 03/30/2019 03/28/2019 02/20/2019  Transportation Screening - Complete Complete  Medication Review (RN Care Manager) - Complete Not Complete  PCP or Specialist appointment within 3-5 days of discharge Not Complete - Complete  PCP/Specialist Appt Not Complete comments recommended for 1 week f/u, PCP closed today, patient to call tomorrow and make appt.  - -  HRI or Home Care Consult - Complete (No Data)  SW Recovery Care/Counseling Consult - Complete (No Data)  Palliative Care Screening - Not Applicable Not Applicable  Skilled Nursing Facility - Not Applicable Not Applicable  Some recent data might be hidden

## 2019-03-30 NOTE — Progress Notes (Signed)
Nsg Discharge Note  Admit Date:  03/24/2019 Discharge date: 03/30/2019   Jeffrey [name] to be D/C'd Home per MD order.  AVS completed.  Copy for chart, and copy for patient signed, and dated. Patient/caregiver able to verbalize understanding.  Discharge Medication: Allergies as of 03/30/2019      Reactions   Augmentin [amoxicillin-pot Clavulanate] Rash, Other (See Comments)   Has patient had a PCN reaction causing immediate rash, facial/tongue/throat swelling, SOB or lightheadedness with hypotension: No Has patient had a PCN reaction causing severe rash involving mucus membranes or skin necrosis: No Has patient had a PCN reaction that required hospitalization No Has patient had a PCN reaction occurring within the last 10 years: Yes If all of the above answers are "NO", then may proceed with Cephalosporin use.   Ace Inhibitors Hives      Medication List    STOP taking these medications   levofloxacin 500 MG tablet Commonly known as:  LEVAQUIN   levofloxacin 750 MG tablet Commonly known as:  LEVAQUIN     TAKE these medications   albuterol (2.5 MG/3ML) 0.083% nebulizer solution Commonly known as:  PROVENTIL Take 3 mLs (2.5 mg total) by nebulization every 4 (four) hours as needed for shortness of breath.   albuterol 108 (90 Base) MCG/ACT inhaler Commonly known as:  ProAir HFA Inhale 2 puffs into the lungs every 4 (four) hours as needed for wheezing or shortness of breath.   aspirin EC 81 MG tablet Take 81 mg by mouth every evening.   atorvastatin 20 MG tablet Commonly known as:  LIPITOR Take 1 tablet (20 mg total) by mouth daily. For high cholesterol   diltiazem 180 MG 24 hr capsule Commonly known as:  CARDIZEM CD Take 1 capsule (180 mg total) by mouth daily.   divalproex 500 MG 24 hr tablet Commonly known as:  DEPAKOTE ER Take 3 tablets (1,500 mg total) by mouth at bedtime.   feeding supplement (ENSURE ENLIVE) Liqd Take 237 mLs by mouth 2 (two) times daily between  meals.   FLUoxetine 40 MG capsule Commonly known as:  PROZAC Take 1 capsule (40 mg total) by mouth daily. For mood control   gabapentin 400 MG capsule Commonly known as:  NEURONTIN Take 1 capsule (400 mg total) by mouth 3 (three) times daily. What changed:    how much to take  when to take this  additional instructions   guaiFENesin 600 MG 12 hr tablet Commonly known as:  MUCINEX Take 1 tablet (600 mg total) by mouth 2 (two) times daily for 10 days.   ipratropium 0.02 % nebulizer solution Commonly known as:  ATROVENT Take 2.5 mLs (0.5 mg total) by nebulization 3 (three) times daily.   methocarbamol 500 MG tablet Commonly known as:  ROBAXIN Take 1 tablet by mouth 3 (three) times daily.   metoprolol tartrate 25 MG tablet Commonly known as:  LOPRESSOR Take 1 tablet (25 mg total) by mouth 2 (two) times daily.   multivitamin with minerals Tabs tablet Take 1 tablet by mouth daily.   OLANZapine 10 MG tablet Commonly known as:  ZYPREXA Take 1.5 tablets (15 mg total) by mouth at bedtime. For mood stability What changed:  how much to take   potassium chloride SA 20 MEQ tablet Commonly known as:  K-DUR Take 4 tablets (80 mEq total) by mouth 2 (two) times daily.   predniSONE 20 MG tablet Commonly known as:  Deltasone Take 2 tablets (40 mg total) by mouth daily for 5  days. What changed:  when to take this   Resource ThickenUp Clear Powd Take 120 g by mouth as needed.   thiamine 100 MG tablet Commonly known as:  VITAMIN B-1 Take 1 tablet (100 mg total) by mouth daily. What changed:  how much to take   traZODone 50 MG tablet Commonly known as:  DESYREL Take 1 tablet (50 mg total) by mouth at bedtime as needed for sleep. What changed:  when to take this       Discharge Assessment: Vitals:   03/30/19 0838 03/30/19 0947  BP:  108/65  Pulse:  92  Resp:    Temp:    SpO2: 100%    Skin clean, dry and intact without evidence of skin break down, no evidence of skin  tears noted. IV catheter discontinued intact. Site without signs and symptoms of complications - no redness or edema noted at insertion site, patient denies c/o pain - only slight tenderness at site.  Dressing with slight pressure applied.  D/c Instructions-Education: Discharge instructions given to patient with verbalized understanding. D/c education completed with patient including follow up instructions, medication list, d/c activities limitations if indicated, with other d/c instructions as indicated by MD - patient able to verbalize understanding, all questions fully answered. Patient instructed to return to ED, call 911, or call MD for any changes in condition.  Patient escorted via WC, and D/C home via private auto.  Dalesha Stanback C, RN 03/30/2019 11:04 AM

## 2019-03-30 NOTE — Progress Notes (Signed)
SATURATION QUALIFICATIONS: (This note is used to comply with regulatory documentation for home oxygen)   Patient Saturations on 5 Liters of oxygen while Ambulating = 95%  Please briefly explain why patient needs home oxygen:   Patient utilizes home O2 5L at baseline.

## 2019-04-01 LAB — CULTURE, BODY FLUID W GRAM STAIN -BOTTLE: Culture: NO GROWTH

## 2019-04-01 LAB — CULTURE, BODY FLUID-BOTTLE

## 2019-04-04 ENCOUNTER — Other Ambulatory Visit: Payer: Self-pay

## 2019-04-04 ENCOUNTER — Ambulatory Visit (HOSPITAL_COMMUNITY): Payer: Medicare Other | Admitting: Psychiatry

## 2019-04-25 LAB — FUNGUS CULTURE WITH STAIN

## 2019-04-25 LAB — FUNGAL ORGANISM REFLEX

## 2019-04-25 LAB — FUNGUS CULTURE RESULT

## 2019-04-30 ENCOUNTER — Inpatient Hospital Stay (HOSPITAL_COMMUNITY)
Admission: EM | Admit: 2019-04-30 | Discharge: 2019-05-02 | DRG: 291 | Disposition: A | Discharge status: Home Health | Payer: Medicare Other | Attending: Internal Medicine | Admitting: Internal Medicine

## 2019-04-30 ENCOUNTER — Emergency Department (HOSPITAL_COMMUNITY): Payer: Medicare Other

## 2019-04-30 ENCOUNTER — Encounter (HOSPITAL_COMMUNITY): Payer: Self-pay | Admitting: Emergency Medicine

## 2019-04-30 ENCOUNTER — Other Ambulatory Visit: Payer: Self-pay

## 2019-04-30 DIAGNOSIS — E785 Hyperlipidemia, unspecified: Secondary | ICD-10-CM | POA: Diagnosis present

## 2019-04-30 DIAGNOSIS — F1021 Alcohol dependence, in remission: Secondary | ICD-10-CM | POA: Diagnosis present

## 2019-04-30 DIAGNOSIS — J9621 Acute and chronic respiratory failure with hypoxia: Secondary | ICD-10-CM | POA: Diagnosis present

## 2019-04-30 DIAGNOSIS — I110 Hypertensive heart disease with heart failure: Secondary | ICD-10-CM | POA: Diagnosis not present

## 2019-04-30 DIAGNOSIS — Z79899 Other long term (current) drug therapy: Secondary | ICD-10-CM

## 2019-04-30 DIAGNOSIS — Z8249 Family history of ischemic heart disease and other diseases of the circulatory system: Secondary | ICD-10-CM

## 2019-04-30 DIAGNOSIS — Z9981 Dependence on supplemental oxygen: Secondary | ICD-10-CM | POA: Diagnosis not present

## 2019-04-30 DIAGNOSIS — I050 Rheumatic mitral stenosis: Secondary | ICD-10-CM | POA: Diagnosis present

## 2019-04-30 DIAGNOSIS — Z7982 Long term (current) use of aspirin: Secondary | ICD-10-CM

## 2019-04-30 DIAGNOSIS — R0789 Other chest pain: Secondary | ICD-10-CM

## 2019-04-30 DIAGNOSIS — Z825 Family history of asthma and other chronic lower respiratory diseases: Secondary | ICD-10-CM | POA: Diagnosis not present

## 2019-04-30 DIAGNOSIS — I5033 Acute on chronic diastolic (congestive) heart failure: Secondary | ICD-10-CM | POA: Diagnosis present

## 2019-04-30 DIAGNOSIS — R0602 Shortness of breath: Secondary | ICD-10-CM

## 2019-04-30 DIAGNOSIS — Z89412 Acquired absence of left great toe: Secondary | ICD-10-CM | POA: Diagnosis not present

## 2019-04-30 DIAGNOSIS — F319 Bipolar disorder, unspecified: Secondary | ICD-10-CM | POA: Diagnosis present

## 2019-04-30 DIAGNOSIS — F419 Anxiety disorder, unspecified: Secondary | ICD-10-CM | POA: Diagnosis present

## 2019-04-30 DIAGNOSIS — Z880 Allergy status to penicillin: Secondary | ICD-10-CM

## 2019-04-30 DIAGNOSIS — J441 Chronic obstructive pulmonary disease with (acute) exacerbation: Secondary | ICD-10-CM | POA: Diagnosis present

## 2019-04-30 DIAGNOSIS — J9691 Respiratory failure, unspecified with hypoxia: Secondary | ICD-10-CM | POA: Insufficient documentation

## 2019-04-30 DIAGNOSIS — E871 Hypo-osmolality and hyponatremia: Secondary | ICD-10-CM | POA: Diagnosis present

## 2019-04-30 DIAGNOSIS — Z888 Allergy status to other drugs, medicaments and biological substances status: Secondary | ICD-10-CM | POA: Diagnosis not present

## 2019-04-30 DIAGNOSIS — Z8701 Personal history of pneumonia (recurrent): Secondary | ICD-10-CM

## 2019-04-30 DIAGNOSIS — I5032 Chronic diastolic (congestive) heart failure: Secondary | ICD-10-CM | POA: Diagnosis present

## 2019-04-30 DIAGNOSIS — I739 Peripheral vascular disease, unspecified: Secondary | ICD-10-CM | POA: Diagnosis present

## 2019-04-30 DIAGNOSIS — Z87891 Personal history of nicotine dependence: Secondary | ICD-10-CM

## 2019-04-30 DIAGNOSIS — I10 Essential (primary) hypertension: Secondary | ICD-10-CM | POA: Diagnosis present

## 2019-04-30 DIAGNOSIS — R079 Chest pain, unspecified: Secondary | ICD-10-CM

## 2019-04-30 DIAGNOSIS — J449 Chronic obstructive pulmonary disease, unspecified: Secondary | ICD-10-CM

## 2019-04-30 DIAGNOSIS — Z20828 Contact with and (suspected) exposure to other viral communicable diseases: Secondary | ICD-10-CM | POA: Diagnosis present

## 2019-04-30 LAB — BASIC METABOLIC PANEL
Anion gap: 12 (ref 5–15)
BUN: 7 mg/dL (ref 6–20)
CO2: 28 mmol/L (ref 22–32)
Calcium: 8.8 mg/dL — ABNORMAL LOW (ref 8.9–10.3)
Chloride: 93 mmol/L — ABNORMAL LOW (ref 98–111)
Creatinine, Ser: 0.69 mg/dL (ref 0.61–1.24)
GFR calc Af Amer: >60 mL/min (ref >60)
GFR calc non Af Amer: >60 mL/min (ref >60)
Glucose, Bld: 163 mg/dL — ABNORMAL HIGH (ref 70–99)
Potassium: 4.3 mmol/L (ref 3.5–5.1)
Sodium: 133 mmol/L — ABNORMAL LOW (ref 135–145)

## 2019-04-30 LAB — CBC
HCT: 33.1 % — ABNORMAL LOW (ref 39.0–52.0)
Hemoglobin: 10.2 g/dL — ABNORMAL LOW (ref 13.0–17.0)
MCH: 25.6 pg — ABNORMAL LOW (ref 26.0–34.0)
MCHC: 30.8 g/dL (ref 30.0–36.0)
MCV: 83.2 fL (ref 80.0–100.0)
Platelets: 365 10*3/uL (ref 150–400)
RBC: 3.98 MIL/uL — ABNORMAL LOW (ref 4.22–5.81)
RDW: 14.6 % (ref 11.5–15.5)
WBC: 7.9 10*3/uL (ref 4.0–10.5)
nRBC: 0 % (ref 0.0–0.2)

## 2019-04-30 LAB — DIFFERENTIAL
Abs Immature Granulocytes: 0.2 10*3/uL — ABNORMAL HIGH (ref 0.00–0.07)
Basophils Absolute: 0.1 10*3/uL (ref 0.0–0.1)
Basophils Relative: 1 %
Eosinophils Absolute: 0.1 10*3/uL (ref 0.0–0.5)
Eosinophils Relative: 1 %
Immature Granulocytes: 3 %
Lymphocytes Relative: 9 %
Lymphs Abs: 0.7 10*3/uL (ref 0.7–4.0)
Monocytes Absolute: 0.4 10*3/uL (ref 0.1–1.0)
Monocytes Relative: 5 %
Neutro Abs: 6.4 10*3/uL (ref 1.7–7.7)
Neutrophils Relative %: 81 %

## 2019-04-30 LAB — HEPATIC FUNCTION PANEL
ALT: 24 U/L (ref 0–44)
AST: 25 U/L (ref 15–41)
Albumin: 3 g/dL — ABNORMAL LOW (ref 3.5–5.0)
Alkaline Phosphatase: 165 U/L — ABNORMAL HIGH (ref 38–126)
Bilirubin, Direct: 0.1 mg/dL (ref 0.0–0.2)
Indirect Bilirubin: 0.3 mg/dL (ref 0.3–0.9)
Total Bilirubin: 0.4 mg/dL (ref 0.3–1.2)
Total Protein: 6.5 g/dL (ref 6.5–8.1)

## 2019-04-30 LAB — TROPONIN I
Troponin I: <0.03 ng/mL (ref <0.03)
Troponin I: <0.03 ng/mL (ref <0.03)

## 2019-04-30 LAB — PROCALCITONIN: Procalcitonin: <0.1 ng/mL

## 2019-04-30 LAB — SARS CORONAVIRUS 2 BY RT PCR (HOSPITAL ORDER, PERFORMED IN ~~LOC~~ HOSPITAL LAB): SARS Coronavirus 2: NEGATIVE

## 2019-04-30 LAB — BRAIN NATRIURETIC PEPTIDE: B Natriuretic Peptide: 245 pg/mL — ABNORMAL HIGH (ref 0.0–100.0)

## 2019-04-30 MED ORDER — VITAMIN B-1 100 MG PO TABS
100.0000 mg | ORAL_TABLET | Freq: Every day | ORAL | Status: DC
  Administered 2019-04-30 – 2019-05-02 (×3): 100 mg via ORAL
  Filled 2019-04-30 (×3): qty 1

## 2019-04-30 MED ORDER — VANCOMYCIN HCL IN DEXTROSE 1-5 GM/200ML-% IV SOLN: 1000.0000 mg | Freq: Two times a day (BID) | INTRAVENOUS | Status: DC

## 2019-04-30 MED ORDER — METHYLPREDNISOLONE SODIUM SUCC 40 MG IJ SOLR
40.0000 mg | Freq: Three times a day (TID) | INTRAMUSCULAR | Status: DC
  Administered 2019-05-01 (×2): 40 mg via INTRAVENOUS
  Filled 2019-04-30 (×2): qty 1

## 2019-04-30 MED ORDER — GABAPENTIN 400 MG PO CAPS
400.0000 mg | ORAL_CAPSULE | Freq: Every day | ORAL | Status: DC
  Administered 2019-05-01 – 2019-05-02 (×2): 400 mg via ORAL
  Filled 2019-04-30 (×2): qty 1

## 2019-04-30 MED ORDER — ALBUTEROL SULFATE HFA 108 (90 BASE) MCG/ACT IN AERS
6.0000 | INHALATION_SPRAY | Freq: Once | RESPIRATORY_TRACT | Status: AC
  Administered 2019-04-30: 6 via RESPIRATORY_TRACT
  Filled 2019-04-30: qty 6.7

## 2019-04-30 MED ORDER — METHYLPREDNISOLONE SODIUM SUCC 125 MG IJ SOLR
125.0000 mg | Freq: Once | INTRAMUSCULAR | Status: AC
  Administered 2019-04-30: 125 mg via INTRAVENOUS
  Filled 2019-04-30: qty 2

## 2019-04-30 MED ORDER — IPRATROPIUM BROMIDE 0.02 % IN SOLN: 0.5000 mg | Freq: Three times a day (TID) | RESPIRATORY_TRACT | Status: DC

## 2019-04-30 MED ORDER — ENSURE ENLIVE PO LIQD: 237.0000 mL | Freq: Two times a day (BID) | ORAL | Status: DC

## 2019-04-30 MED ORDER — GABAPENTIN 400 MG PO CAPS
800.0000 mg | ORAL_CAPSULE | Freq: Every day | ORAL | Status: DC
  Administered 2019-04-30 – 2019-05-01 (×2): 800 mg via ORAL
  Filled 2019-04-30 (×2): qty 2

## 2019-04-30 MED ORDER — ACETAMINOPHEN 650 MG RE SUPP: 650.0000 mg | Freq: Four times a day (QID) | RECTAL | Status: DC | PRN

## 2019-04-30 MED ORDER — TRAZODONE HCL 50 MG PO TABS: 50.0000 mg | ORAL_TABLET | Freq: Every evening | ORAL | Status: DC | PRN

## 2019-04-30 MED ORDER — LORAZEPAM 1 MG PO TABS: 1.0000 mg | ORAL_TABLET | Freq: Four times a day (QID) | ORAL | Status: DC | PRN

## 2019-04-30 MED ORDER — DOXYCYCLINE HYCLATE 100 MG PO TABS
100.0000 mg | ORAL_TABLET | Freq: Two times a day (BID) | ORAL | Status: DC
  Administered 2019-04-30 – 2019-05-02 (×4): 100 mg via ORAL
  Filled 2019-04-30 (×4): qty 1

## 2019-04-30 MED ORDER — METHOCARBAMOL 500 MG PO TABS
500.0000 mg | ORAL_TABLET | Freq: Three times a day (TID) | ORAL | Status: DC
  Administered 2019-04-30 – 2019-05-02 (×6): 500 mg via ORAL
  Filled 2019-04-30 (×6): qty 1

## 2019-04-30 MED ORDER — SODIUM CHLORIDE 0.9 % IV SOLN: 250.0000 mL | INTRAVENOUS | Status: DC | PRN

## 2019-04-30 MED ORDER — POTASSIUM CHLORIDE CRYS ER 20 MEQ PO TBCR
80.0000 meq | EXTENDED_RELEASE_TABLET | Freq: Two times a day (BID) | ORAL | Status: DC
  Administered 2019-04-30 – 2019-05-01 (×3): 80 meq via ORAL
  Filled 2019-04-30 (×4): qty 4

## 2019-04-30 MED ORDER — ALBUTEROL SULFATE HFA 108 (90 BASE) MCG/ACT IN AERS: 2.0000 | INHALATION_SPRAY | RESPIRATORY_TRACT | Status: DC | PRN

## 2019-04-30 MED ORDER — ALBUTEROL SULFATE (2.5 MG/3ML) 0.083% IN NEBU
2.5000 mg | INHALATION_SOLUTION | RESPIRATORY_TRACT | Status: DC | PRN
  Filled 2019-04-30: qty 3

## 2019-04-30 MED ORDER — METOPROLOL TARTRATE 25 MG PO TABS
25.0000 mg | ORAL_TABLET | Freq: Two times a day (BID) | ORAL | Status: DC
  Administered 2019-04-30 – 2019-05-02 (×4): 25 mg via ORAL
  Filled 2019-04-30 (×4): qty 1

## 2019-04-30 MED ORDER — MAGNESIUM SULFATE 2 GM/50ML IV SOLN
2.0000 g | Freq: Once | INTRAVENOUS | Status: AC
  Administered 2019-04-30: 2 g via INTRAVENOUS
  Filled 2019-04-30: qty 50

## 2019-04-30 MED ORDER — ATORVASTATIN CALCIUM 20 MG PO TABS
20.0000 mg | ORAL_TABLET | Freq: Every day | ORAL | Status: DC
  Administered 2019-04-30 – 2019-05-01 (×2): 20 mg via ORAL
  Filled 2019-04-30 (×2): qty 1

## 2019-04-30 MED ORDER — ADULT MULTIVITAMIN W/MINERALS CH: 1.0000 | ORAL_TABLET | Freq: Every day | ORAL | Status: DC

## 2019-04-30 MED ORDER — FLUOXETINE HCL 20 MG PO CAPS
40.0000 mg | ORAL_CAPSULE | Freq: Every day | ORAL | Status: DC
  Administered 2019-04-30 – 2019-05-02 (×3): 40 mg via ORAL
  Filled 2019-04-30 (×3): qty 2

## 2019-04-30 MED ORDER — FUROSEMIDE 10 MG/ML IJ SOLN
20.0000 mg | Freq: Two times a day (BID) | INTRAMUSCULAR | Status: DC
  Administered 2019-05-01 – 2019-05-02 (×3): 20 mg via INTRAVENOUS
  Filled 2019-04-30 (×3): qty 2

## 2019-04-30 MED ORDER — OLANZAPINE 5 MG PO TABS
15.0000 mg | ORAL_TABLET | Freq: Every day | ORAL | Status: DC
  Administered 2019-04-30 – 2019-05-01 (×2): 15 mg via ORAL
  Filled 2019-04-30 (×2): qty 3

## 2019-04-30 MED ORDER — IPRATROPIUM-ALBUTEROL 0.5-2.5 (3) MG/3ML IN SOLN: 3.0000 mL | Freq: Four times a day (QID) | RESPIRATORY_TRACT | Status: DC

## 2019-04-30 MED ORDER — DIVALPROEX SODIUM ER 500 MG PO TB24
1500.0000 mg | ORAL_TABLET | Freq: Every day | ORAL | Status: DC
  Administered 2019-04-30 – 2019-05-01 (×2): 1500 mg via ORAL
  Filled 2019-04-30 (×2): qty 3

## 2019-04-30 MED ORDER — HEPARIN SODIUM (PORCINE) 5000 UNIT/ML IJ SOLN
5000.0000 [IU] | Freq: Three times a day (TID) | INTRAMUSCULAR | Status: DC
  Administered 2019-04-30 – 2019-05-02 (×5): 5000 [IU] via SUBCUTANEOUS
  Filled 2019-04-30 (×5): qty 1

## 2019-04-30 MED ORDER — VANCOMYCIN HCL 1.5 G IV SOLR
1500.0000 mg | Freq: Once | INTRAVENOUS | Status: AC
  Administered 2019-04-30: 17:00:00 1500 mg via INTRAVENOUS
  Filled 2019-04-30: qty 1500

## 2019-04-30 MED ORDER — DILTIAZEM HCL ER COATED BEADS 180 MG PO CP24
180.0000 mg | ORAL_CAPSULE | Freq: Every day | ORAL | Status: DC
  Administered 2019-04-30 – 2019-05-02 (×3): 180 mg via ORAL
  Filled 2019-04-30 (×3): qty 1

## 2019-04-30 MED ORDER — FOLIC ACID 1 MG PO TABS
1.0000 mg | ORAL_TABLET | Freq: Every day | ORAL | Status: DC
  Administered 2019-04-30 – 2019-05-02 (×3): 1 mg via ORAL
  Filled 2019-04-30 (×3): qty 1

## 2019-04-30 MED ORDER — ADULT MULTIVITAMIN W/MINERALS CH
1.0000 | ORAL_TABLET | Freq: Every day | ORAL | Status: DC
  Administered 2019-05-01: 1 via ORAL
  Filled 2019-04-30 (×2): qty 1

## 2019-04-30 MED ORDER — GABAPENTIN 400 MG PO CAPS: 400.0000 mg | ORAL_CAPSULE | Freq: Three times a day (TID) | ORAL | Status: DC

## 2019-04-30 MED ORDER — MELATONIN 3 MG PO TABS: 9.0000 mg | ORAL_TABLET | Freq: Every day | ORAL | Status: DC

## 2019-04-30 MED ORDER — NON FORMULARY: 10.0000 mg | Freq: Once | Status: DC

## 2019-04-30 MED ORDER — SODIUM CHLORIDE 0.9% FLUSH
3.0000 mL | Freq: Two times a day (BID) | INTRAVENOUS | Status: DC
  Administered 2019-04-30 – 2019-05-02 (×4): 3 mL via INTRAVENOUS

## 2019-04-30 MED ORDER — ALBUTEROL SULFATE (2.5 MG/3ML) 0.083% IN NEBU: 2.5000 mg | INHALATION_SOLUTION | RESPIRATORY_TRACT | Status: DC | PRN

## 2019-04-30 MED ORDER — FUROSEMIDE 10 MG/ML IJ SOLN
20.0000 mg | Freq: Once | INTRAMUSCULAR | Status: AC
  Administered 2019-04-30: 20 mg via INTRAVENOUS
  Filled 2019-04-30: qty 2

## 2019-04-30 MED ORDER — ONDANSETRON HCL 4 MG PO TABS: 4.0000 mg | ORAL_TABLET | Freq: Four times a day (QID) | ORAL | Status: DC | PRN

## 2019-04-30 MED ORDER — LORAZEPAM 2 MG/ML IJ SOLN
1.0000 mg | Freq: Four times a day (QID) | INTRAMUSCULAR | Status: DC | PRN
  Administered 2019-05-01: 22:00:00 1 mg via INTRAVENOUS
  Filled 2019-04-30: qty 1

## 2019-04-30 MED ORDER — ONDANSETRON HCL 4 MG/2ML IJ SOLN: 4.0000 mg | Freq: Four times a day (QID) | INTRAMUSCULAR | Status: DC | PRN

## 2019-04-30 MED ORDER — SODIUM CHLORIDE 0.9% FLUSH: 3.0000 mL | INTRAVENOUS | Status: DC | PRN

## 2019-04-30 MED ORDER — SODIUM CHLORIDE 0.9% FLUSH: 3.0000 mL | Freq: Once | INTRAVENOUS | Status: DC

## 2019-04-30 MED ORDER — THIAMINE HCL 100 MG/ML IJ SOLN: 100.0000 mg | Freq: Every day | INTRAMUSCULAR | Status: DC

## 2019-04-30 MED ORDER — ACETAMINOPHEN 325 MG PO TABS: 650.0000 mg | ORAL_TABLET | Freq: Four times a day (QID) | ORAL | Status: DC | PRN

## 2019-04-30 MED ORDER — IPRATROPIUM-ALBUTEROL 0.5-2.5 (3) MG/3ML IN SOLN
3.0000 mL | Freq: Four times a day (QID) | RESPIRATORY_TRACT | Status: DC
  Administered 2019-04-30: 3 mL via RESPIRATORY_TRACT

## 2019-04-30 MED ORDER — POLYETHYLENE GLYCOL 3350 17 G PO PACK: 17.0000 g | PACK | Freq: Every day | ORAL | Status: DC | PRN

## 2019-04-30 MED ORDER — GUAIFENESIN ER 600 MG PO TB12
600.0000 mg | ORAL_TABLET | Freq: Two times a day (BID) | ORAL | Status: DC
  Administered 2019-04-30 – 2019-05-02 (×4): 600 mg via ORAL
  Filled 2019-04-30 (×4): qty 1

## 2019-04-30 MED ORDER — SODIUM CHLORIDE 0.9 % IV SOLN: 2.0000 g | Freq: Three times a day (TID) | INTRAVENOUS | Status: DC

## 2019-04-30 MED ORDER — VANCOMYCIN HCL 10 G IV SOLR
INTRAVENOUS | Status: AC
  Filled 2019-04-30: qty 1500

## 2019-04-30 MED ORDER — ASPIRIN EC 81 MG PO TBEC
81.0000 mg | DELAYED_RELEASE_TABLET | Freq: Every evening | ORAL | Status: DC
  Administered 2019-04-30 – 2019-05-01 (×2): 81 mg via ORAL
  Filled 2019-04-30 (×2): qty 1

## 2019-04-30 MED ORDER — TRAZODONE HCL 50 MG PO TABS
50.0000 mg | ORAL_TABLET | Freq: Every day | ORAL | Status: DC
  Administered 2019-04-30 – 2019-05-01 (×2): 50 mg via ORAL
  Filled 2019-04-30 (×2): qty 1

## 2019-04-30 MED ORDER — SODIUM CHLORIDE 0.9 % IV SOLN
1.0000 g | Freq: Once | INTRAVENOUS | Status: AC
  Administered 2019-04-30: 1 g via INTRAVENOUS
  Filled 2019-04-30: qty 1

## 2019-04-30 MED ORDER — VITAMIN B-1 100 MG PO TABS: 100.0000 mg | ORAL_TABLET | Freq: Every day | ORAL | Status: DC

## 2019-04-30 NOTE — ED Triage Notes (Signed)
Pt on home O2, 5L [place]  Has had shortness of breath for the last 2 days   Here for eval

## 2019-04-30 NOTE — H&P (Signed)
Patient Demographics:    Jeffrey [name], is a 58 y.o. male  MRN: [id number]   DOB - 1960/12/01  Admit Date - 04/30/2019  Outpatient Primary MD for the patient is Gareth Morgan, MD   Assessment & Plan:    Principal Problem:   Acute on chronic respiratory failure with hypoxia [facility]) Active Problems:   Chronic diastolic CHF (congestive heart failure) (HCC)   COPD exacerbation (HCC)   [name] (peripheral vascular disease) (HCC)   Essential hypertension    1) acute on chronic hypoxic respiratory failure----suspect due to combination of diastolic dysfunction CHF exacerbation and COPD exacerbation--- treat as below,  continue supplemental oxygen  2)HFpEF/acute exacerbation of chronic diastolic dysfunction CHF--- last known EF 65 to 70% based on echo from 12/19/2018 with grade through diastolic dysfunction and moderate mitral stenosis----patient with worsening oxygen requirement, orthopnea, shortness of breath and dyspnea on exertion consistent with acute on chronic [name] exacerbation  , treat empirically with IV Lasix, daily weights, fluid input and output monitoring, troponin is not elevated  3)Acute COPD exacerbation----no definite pneumonia, no fevers, no leukocytosis, procalcitonin is not elevated, patient with severe COPD at baseline, previously required intubation, give IV Solu-Medrol, mucolytics, bronchodilators and doxycycline  4) bipolar disorder--- stable, continue Depakote for mood, continue Prozac Zyprexa and trazodone nightly  5)H/o alcohol and tobacco abuse--- patient is congratulated on being abstinent from both alcohol and tobacco, encouraged to stay abstinent, continue folic acid and multivitamin  6)H/o PAD--stable, continue aspirin and Lipitor  Reviewed by me  Past Medical History:  Diagnosis Date    Acute blood loss anemia 02/07/2017   Anxiety    Arthritis    deg disease, bulging disk,  shoulder level   Bipolar disorder (HCC)    Bipolar disorder (HCC)    CHF (congestive heart failure) (HCC)    COPD, severe (HCC) 10/09/2016   Depression    anxiety   Hyperlipidemia    Hypertension    Peptic ulcer disease    Review   Pneumonia    [name] (peripheral vascular disease) (HCC) 06/18/2015   Shortness of breath       Past Surgical History:  Procedure Laterality Date   BIOPSY  02/09/2017   Procedure: BIOPSY;  Surgeon: [name], MD;  Location: [facility];  Service: Endoscopy;;  duodenal gastric   COLONOSCOPY  03/14/2007   QMV:HQIONG colonoscopy and terminal ileoscopy except external hemorrhoids   COLONOSCOPY N/A 05/05/2013   [name]: external/internal anal canal hemorrhoids, unable to intubate TI, segemental biopsies unremarkable    COLONOSCOPY N/A 04/11/2017   Procedure: COLONOSCOPY;  Surgeon: [name], MD;  Location: [facility];  Service: Endoscopy;  Laterality: N/A;   COLONOSCOPY WITH PROPOFOL N/A 03/31/2017   Procedure: COLONOSCOPY WITH PROPOFOL;  Surgeon: [name], MD;  Location: [facility];  Service: Endoscopy;  Laterality: N/A;  1:45pm   ESOPHAGOGASTRODUODENOSCOPY  03/14/2007   EXB:[id number] antral gastritis with bulbar duodenitis/paucity to postbulbar  duodenal folds and biopsy were benign with no evidence of villous atrophy.   ESOPHAGOGASTRODUODENOSCOPY (EGD) WITH PROPOFOL N/A 02/09/2017   Procedure: ESOPHAGOGASTRODUODENOSCOPY (EGD) WITH PROPOFOL;  Surgeon: [name], MD;  Location: [facility];  Service: Endoscopy;  Laterality: N/A;   GIVENS CAPSULE STUDY N/A 04/08/2017   Procedure: GIVENS CAPSULE STUDY;  Surgeon: [name], MD;  Location: [facility];  Service: Endoscopy;  Laterality: N/A;   HAND SURGERY     left, secondary to self-inflicted laceration   HEMORRHOID SURGERY N/A 04/14/2017   Procedure:  HEMORRHOIDECTOMY;  Surgeon: [name], MD;  Location: [facility];  Service: General;  Laterality: N/A;   SHOULDER SURGERY     right   TOE SURGERY     left great toe , amputated-lawnmover accident    Chief Complaint  Patient presents with   Shortness of Breath   Chest Pain      HPI:    Jeffrey [name]  is a 58 y.o. male who is a reformed smoker  (quit 2020) and a reformed alcoholic (quit 2020) with pmhx relevant for chronic hypoxic respiratory failure on 5 L of oxygen at baseline at home, severe  COPD (requiring intubation previously), [name], HTN, [name], [name], depression/anxiety, bipolar disorder, dyslipidemia, PAD ED with complaints of worsening shortness of breath for the last couple days... Chest discomfort earlier but no pleuritic symptoms per se.... No leg pains, patient endorses orthopnea and dyspnea on exertion.. No fever, cough is nonproductive no vomiting no diarrhea no abdominal pain  Patient apparently actively using nebulizer treatments with some improvement, he does not have diuretics at home..  In the ED he is found to be requiring oxygen above his baseline of 5 L initially  In Ed....Marland KitchenMarland Kitchen procalcitonin is not elevated (< 0.10), BNP is 245, troponin is not elevated , no fevers, WBC is 7.9, chest x-ray consistent with CHF/vascular congestion/asymmetric edema, creatinine 0.69, sodium is 133 with a chloride of 93 , hemoglobin is 10.2 which is higher than patient's recent baseline  Covid 19 test is negative   Review of systems:    In addition to the HPI above,   A full Review of  Systems was done, all other systems reviewed are negative except as noted above in HPI , .   Social History:  Reviewed by me   Social History   Tobacco Use   Smoking status: Former Smoker    Packs/day: 1.00    Years: 40.00    Pack years: 40.00    Types: Cigarettes    Start date: 08/04/1971   Smokeless tobacco: Never Used   Tobacco comment: peak rate of 2.5ppd, 1/2ppd on 11/27/2016 -- 6  cigarettes / day 01/25/18  Substance Use Topics   Alcohol use: No    Alcohol/week: 0.0 standard drinks     Family History :  Reviewed by me   Family History  Problem Relation Age of Onset   Breast cancer Mother        deceased   Heart disease Father    Depression Daughter    Anxiety disorder Daughter    Anxiety disorder Son    Depression Son    Asthma Brother    Heart attack Maternal Aunt    Heart attack Maternal Uncle    Heart attack Paternal Aunt    Heart attack Paternal Uncle    Heart attack Maternal Grandmother    Heart attack Maternal Grandfather    Emphysema Maternal Grandfather    Heart attack Paternal Grandmother  Heart attack Paternal Grandfather    Colon cancer Neg Hx    Liver disease Neg Hx     Home Medications:   Prior to Admission medications   Medication Sig Start Date End Date Taking? Authorizing Provider  albuterol (PROAIR HFA) 108 (90 Base) MCG/ACT inhaler Inhale 2 puffs into the lungs every 4 (four) hours as needed for wheezing or shortness of breath. 03/07/19  Yes Rhetta Mura, MD  albuterol (PROVENTIL) (2.5 MG/3ML) 0.083% nebulizer solution Take 3 mLs (2.5 mg total) by nebulization every 4 (four) hours as needed for shortness of breath. 01/13/19  Yes Mikhail, Nita Sells, DO  aspirin EC 81 MG tablet Take 81 mg by mouth every evening.    Yes [provider]  atorvastatin (LIPITOR) 20 MG tablet Take 1 tablet (20 mg total) by mouth daily. For high cholesterol Patient taking differently: Take 20 mg by mouth every evening. For high cholesterol 02/09/18  Yes Money, Gerlene Burdock, FNP  diltiazem (CARDIZEM CD) 180 MG 24 hr capsule Take 1 capsule (180 mg total) by mouth daily. 03/08/19  Yes Rhetta Mura, MD  divalproex (DEPAKOTE ER) 500 MG 24 hr tablet Take 3 tablets (1,500 mg total) by mouth at bedtime. 11/28/18  Yes Myrlene Broker, MD  feeding supplement, ENSURE ENLIVE, (ENSURE ENLIVE) LIQD Take 237 mLs by mouth 2 (two) times daily  between meals. 03/30/19  Yes Shah, Pratik D, DO  FLUoxetine (PROZAC) 40 MG capsule Take 1 capsule (40 mg total) by mouth daily. For mood control 11/28/18  Yes Myrlene Broker, MD  furosemide (LASIX) 20 MG tablet Take 20 mg by mouth daily.   Yes [provider]  gabapentin (NEURONTIN) 400 MG capsule Take 1 capsule (400 mg total) by mouth 3 (three) times daily. 11/28/18  Yes Myrlene Broker, MD  ipratropium (ATROVENT) 0.02 % nebulizer solution Take 2.5 mLs (0.5 mg total) by nebulization 3 (three) times daily. 01/13/19  Yes Mikhail, Nita Sells, DO  methocarbamol (ROBAXIN) 500 MG tablet Take 1 tablet by mouth 3 (three) times daily. 03/16/19  Yes [provider]  metoprolol tartrate (LOPRESSOR) 25 MG tablet Take 1 tablet (25 mg total) by mouth 2 (two) times daily. Patient taking differently: Take 50 mg by mouth every evening.  03/07/19  Yes Rhetta Mura, MD  Multiple Vitamin (MULTIVITAMIN WITH MINERALS) TABS tablet Take 1 tablet by mouth daily. 01/13/19  Yes Mikhail, Maryann, DO  OLANZapine (ZYPREXA) 10 MG tablet Take 1.5 tablets (15 mg total) by mouth at bedtime. For mood stability 11/28/18  Yes Myrlene Broker, MD  predniSONE (DELTASONE) 10 MG tablet Take 10 mg by mouth daily with breakfast.   Yes [provider]  traZODone (DESYREL) 50 MG tablet Take 1 tablet (50 mg total) by mouth at bedtime as needed for sleep. Patient taking differently: Take 50 mg by mouth at bedtime.  11/28/18  Yes Myrlene Broker, MD     Allergies:     Allergies  Allergen Reactions   Augmentin [Amoxicillin-Pot Clavulanate] Rash and Other (See Comments)    Has patient had a PCN reaction causing immediate rash, facial/tongue/throat swelling, SOB or lightheadedness with hypotension: No Has patient had a PCN reaction causing severe rash involving mucus membranes or skin necrosis: No Has patient had a PCN reaction that required hospitalization No Has patient had a PCN reaction occurring within the last 10  years: Yes If all of the above answers are "NO", then may proceed with Cephalosporin use.   Ace Inhibitors Hives  Physical Exam:   Vitals  Blood pressure 115/75, pulse 83, temperature 98.4 F (36.9 C), temperature source Oral, resp. rate (!) 22, height 5\' 10"  (1.778 m), weight 70.8 kg, SpO2 100 %.  Physical Examination: General appearance - alert, chronically ill-appearing and only able to speak in short sentences  mental status - alert, oriented to person, place, and time,  Eyes - sclera anicteric Nose- [place] 5L/min Neck - supple, no JVD elevation , Chest -diminished in bases, Rales in right lung base, scattered wheezes right more than left heart - S1 and S2 normal, regular  Abdomen - soft, nontender, nondistended, no masses or organomegaly Neurological - screening mental status exam normal, neck supple without rigidity, cranial nerves II through XII intact, DTR's normal and symmetric Extremities -trace pedal edema noted, intact peripheral pulses  Skin - warm, dry     Data Review:    CBC Recent Labs  Lab 04/30/19 1340  WBC 7.9  HGB 10.2*  HCT 33.1*  PLT 365  MCV 83.2  MCH 25.6*  MCHC 30.8  RDW 14.6  LYMPHSABS 0.7  MONOABS 0.4  EOSABS 0.1  BASOSABS 0.1   ------------------------------------------------------------------------------------------------------------------  Chemistries  Recent Labs  Lab 04/30/19 1340  NA 133*  K 4.3  CL 93*  CO2 28  GLUCOSE 163*  BUN 7  CREATININE 0.69  CALCIUM 8.8*  AST 25  ALT 24  ALKPHOS 165*  BILITOT 0.4   ------------------------------------------------------------------------------------------------------------------ estimated creatinine clearance is 100.8 mL/min (by C-G formula based on SCr of 0.69 mg/dL). ------------------------------------------------------------------------------------------------------------------ No results for input(s): TSH, T4TOTAL, T3FREE, THYROIDAB in the last 72 hours.  Invalid  input(s): FREET3   Coagulation profile No results for input(s): INR, PROTIME in the last 168 hours. ------------------------------------------------------------------------------------------------------------------- No results for input(s): DDIMER in the last 72 hours. -------------------------------------------------------------------------------------------------------------------  Cardiac Enzymes Recent Labs  Lab 04/30/19 1340 04/30/19 1631  TROPONINI <0.03 <0.03   ------------------------------------------------------------------------------------------------------------------    Component Value Date/Time   BNP 245.0 (H) 04/30/2019 1340     ---------------------------------------------------------------------------------------------------------------  Urinalysis    Component Value Date/Time   COLORURINE YELLOW 03/24/2019 1215   APPEARANCEUR CLEAR 03/24/2019 1215   LABSPEC 1.008 03/24/2019 1215   PHURINE 7.0 03/24/2019 1215   GLUCOSEU NEGATIVE 03/24/2019 1215   HGBUR NEGATIVE 03/24/2019 1215   BILIRUBINUR NEGATIVE 03/24/2019 1215   KETONESUR NEGATIVE 03/24/2019 1215   PROTEINUR NEGATIVE 03/24/2019 1215   UROBILINOGEN 0.2 03/01/2011 1427   NITRITE NEGATIVE 03/24/2019 1215   LEUKOCYTESUR NEGATIVE 03/24/2019 1215    ----------------------------------------------------------------------------------------------------------------   Imaging Results:    Dg Chest Port 1 View  Result Date: 04/30/2019 CLINICAL DATA:  Chest pain and shortness-of-breath. Patient oxygen dependent. EXAM: PORTABLE CHEST 1 VIEW COMPARISON:  03/27/2019 FINDINGS: Lungs are adequately inflated demonstrate interval improvement with persistent hazy airspace consolidation over the right mid to lower lung. Slight worsening hazy opacification over the right upper lung. Persistent hazy prominence of the central pulmonary vessels. No evidence of effusion. Cardiomediastinal silhouette and remainder the exam is  unchanged. IMPRESSION: Interval improvement, but persistent airspace process over the right mid to lower lung with slight worsening hazy density over the right upper lung which may be due to asymmetric edema versus persistent infection. Suggestion of mild vascular congestion Electronically Signed   By: Elberta Fortis M.D.   On: 04/30/2019 14:37    Radiological Exams on Admission: Dg Chest Port 1 View  Result Date: 04/30/2019 CLINICAL DATA:  Chest pain and shortness-of-breath. Patient oxygen dependent. EXAM: PORTABLE CHEST 1 VIEW COMPARISON:  03/27/2019 FINDINGS: Lungs  are adequately inflated demonstrate interval improvement with persistent hazy airspace consolidation over the right mid to lower lung. Slight worsening hazy opacification over the right upper lung. Persistent hazy prominence of the central pulmonary vessels. No evidence of effusion. Cardiomediastinal silhouette and remainder the exam is unchanged. IMPRESSION: Interval improvement, but persistent airspace process over the right mid to lower lung with slight worsening hazy density over the right upper lung which may be due to asymmetric edema versus persistent infection. Suggestion of mild vascular congestion Electronically Signed   By: Elberta Fortis M.D.   On: 04/30/2019 14:37    DVT Prophylaxis -SCD/Heparin AM Labs Ordered, also please review Full Orders  Family Communication: Admission, patients condition and plan of care including tests being ordered have been discussed with the patient who indicate understanding and agree with the plan   Code Status - Full Code  Likely DC to  home  Condition   stable Shon Hale M.D on 04/30/2019 at 9:14 PM Go to [email or web address] -  for contact info  [facility] - Office  [phone]

## 2019-04-30 NOTE — ED Triage Notes (Signed)
Pt also reports chest pain for the last 2 days

## 2019-04-30 NOTE — ED Provider Notes (Signed)
[facility] Provider Note   CSN: [id number] Arrival date & time: 04/30/19  1312    History   Chief Complaint Chief Complaint  Patient presents with  . Shortness of Breath  . Chest Pain    HPI Jeffrey [name] is a 58 y.o. male presenting for evaluation of chest pain or shortness of breath.  Patient states he has had increased chest pain and shortness of breath for the past 2 days.  Pain is worse when he takes a deep breath in.  He also reports worsening shortness of breath and laying flat.  Patient was recently treated for pneumonia, was placed on oxygen at that time and as such is currently on oxygen at home.  He denies fevers.  He states he has had increased cough over the past several days, it is nonproductive.  He denies nausea, vomiting, abdominal pain, urinary symptoms, normal bowel movements.  He is not on Lasix, but states that he used to be.  Patient reports his shortness of breath yesterday improved with albuterol, but he has not had any albuterol today.  Additional history obtained from chart review.  Patient was admitted in March during which he was intubated for multifocal pneumonia.  He had a long admission in May as well for HCAP pneumonia.  H/o COPD, CHF, HTN, [name], [name].     HPI  Past Medical History:  Diagnosis Date  . Acute blood loss anemia 02/07/2017  . Anxiety   . Arthritis    deg disease, bulging disk,  shoulder level  . Bipolar disorder (HCC)   . Bipolar disorder (HCC)   . CHF (congestive heart failure) (HCC)   . 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 Depression    anxiety  . Hyperlipidemia   . Hypertension   . Peptic ulcer disease    Review  . Pneumonia   . [name] (peripheral vascular disease) (HCC) 06/18/2015  . Shortness of breath     Patient Active Problem List   Diagnosis Date Noted  . Respiratory failure with hypoxia (HCC) 04/30/2019  . Bronchiectasis (HCC) 03/24/2019  . Fever 03/24/2019  . Cough   . Dysphagia   . Palliative  care by specialist   . Goals of care, counseling/discussion   . Hepatic encephalopathy (HCC)   . COPD exacerbation (HCC) 09/16/2018  . Essential hypertension 09/16/2018  . GERD (gastroesophageal reflux disease) 09/16/2018  . Hypoxia   . Chronic diastolic CHF (congestive heart failure) (HCC)   . Bipolar disorder with severe depression (HCC) 02/01/2018  . Alcohol use disorder, severe, dependence (HCC) 02/01/2018  . Cannabis use disorder, severe, dependence (HCC) 02/01/2018  . MDD (major depressive disorder), recurrent episode, severe (HCC) 01/31/2018  . Internal and external bleeding hemorrhoids   . Rectal bleeding   . Iron deficiency anemia due to chronic blood loss 03/09/2017  . Anemia   . GIB (gastrointestinal bleeding) 02/07/2017  . Peptic ulcer disease   . Bipolar disorder (HCC)   . 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 H/O: depression 08/26/2016  . Dyslipidemia 08/26/2016  . Tobacco use disorder 08/26/2016  . [name] (peripheral vascular disease) (HCC) 06/18/2015  . Chronic diarrhea 05/02/2013  . Hematochezia 05/02/2013  . Abnormal weight loss 05/02/2013  . Abdominal pain, periumbilical 05/02/2013    Past Surgical History:  Procedure Laterality Date  . BIOPSY  02/09/2017   Procedure: BIOPSY;  Surgeon: West BaliSandi L Fields, MD;  Location: [facility];  Service: Endoscopy;;  duodenal gastric  . COLONOSCOPY  03/14/2007  ZOX:[id number]:Normal colonoscopy and terminal ileoscopy except external hemorrhoids  . COLONOSCOPY N/A 05/05/2013   Dr. Jena Gaussourk: external/internal anal canal hemorrhoids, unable to intubate TI, segemental biopsies unremarkable   . COLONOSCOPY N/A 04/11/2017   Procedure: COLONOSCOPY;  Surgeon: West BaliFields, Sandi L, MD;  Location: [facility];  Service: Endoscopy;  Laterality: N/A;  . COLONOSCOPY WITH PROPOFOL N/A 03/31/2017   Procedure: COLONOSCOPY WITH PROPOFOL;  Surgeon: Corbin Adeourk, Robert M, MD;  Location: [facility];  Service: Endoscopy;  Laterality: N/A;  1:45pm  .  ESOPHAGOGASTRODUODENOSCOPY  03/14/2007   WUJ:[id number]:Nonerosive antral gastritis with bulbar duodenitis/paucity to postbulbar duodenal folds and biopsy were benign with no evidence of villous atrophy.  . ESOPHAGOGASTRODUODENOSCOPY (EGD) WITH PROPOFOL N/A 02/09/2017   Procedure: ESOPHAGOGASTRODUODENOSCOPY (EGD) WITH PROPOFOL;  Surgeon: West BaliSandi L Fields, MD;  Location: [facility];  Service: Endoscopy;  Laterality: N/A;  . GIVENS CAPSULE STUDY N/A 04/08/2017   Procedure: GIVENS CAPSULE STUDY;  Surgeon: Corbin Adeourk, Robert M, MD;  Location: [facility];  Service: Endoscopy;  Laterality: N/A;  . HAND SURGERY     left, secondary to self-inflicted laceration  . HEMORRHOID SURGERY N/A 04/14/2017   Procedure: HEMORRHOIDECTOMY;  Surgeon: Franky MachoJenkins, Mark, MD;  Location: [facility];  Service: General;  Laterality: N/A;  . SHOULDER SURGERY     right  . TOE SURGERY     left great toe , amputated-lawnmover accident        Home Medications    Prior to Admission medications   Medication Sig Start Date End Date Taking? Authorizing Provider  albuterol (PROAIR HFA) 108 (90 Base) MCG/ACT inhaler Inhale 2 puffs into the lungs every 4 (four) hours as needed for wheezing or shortness of breath. 03/07/19   Rhetta MuraSamtani, Jai-Gurmukh, MD  albuterol (PROVENTIL) (2.5 MG/3ML) 0.083% nebulizer solution Take 3 mLs (2.5 mg total) by nebulization every 4 (four) hours as needed for shortness of breath. 01/13/19   Edsel PetrinMikhail, Maryann, DO  aspirin EC 81 MG tablet Take 81 mg by mouth every evening.     [provider]  atorvastatin (LIPITOR) 20 MG tablet Take 1 tablet (20 mg total) by mouth daily. For high cholesterol 02/09/18   Money, Gerlene Burdockravis B, FNP  diltiazem (CARDIZEM CD) 180 MG 24 hr capsule Take 1 capsule (180 mg total) by mouth daily. 03/08/19   Rhetta MuraSamtani, Jai-Gurmukh, MD  divalproex (DEPAKOTE ER) 500 MG 24 hr tablet Take 3 tablets (1,500 mg total) by mouth at bedtime. 11/28/18   Myrlene Brokeross, Deborah R, MD  feeding supplement, ENSURE ENLIVE, (ENSURE  ENLIVE) LIQD Take 237 mLs by mouth 2 (two) times daily between meals. 03/30/19   Sherryll BurgerShah, Pratik D, DO  FLUoxetine (PROZAC) 40 MG capsule Take 1 capsule (40 mg total) by mouth daily. For mood control 11/28/18   Myrlene Brokeross, Deborah R, MD  gabapentin (NEURONTIN) 400 MG capsule Take 1 capsule (400 mg total) by mouth 3 (three) times daily. Patient taking differently: Take 400-800 mg by mouth See admin instructions. 400mg  in the morning and 800mg  in the evening 11/28/18   Myrlene Brokeross, Deborah R, MD  ipratropium (ATROVENT) 0.02 % nebulizer solution Take 2.5 mLs (0.5 mg total) by nebulization 3 (three) times daily. 01/13/19   Mikhail, Nita SellsMaryann, DO  Maltodextrin-Xanthan Gum (RESOURCE THICKENUP CLEAR) POWD Take 120 g by mouth as needed. Patient not taking: Reported on 03/24/2019 03/07/19   Rhetta MuraSamtani, Jai-Gurmukh, MD  methocarbamol (ROBAXIN) 500 MG tablet Take 1 tablet by mouth 3 (three) times daily. 03/16/19   [provider]  metoprolol tartrate (LOPRESSOR) 25 MG  tablet Take 1 tablet (25 mg total) by mouth 2 (two) times daily. 03/07/19   Nita Sells, MD  Multiple Vitamin (MULTIVITAMIN WITH MINERALS) TABS tablet Take 1 tablet by mouth daily. 01/13/19   Mikhail, Velta Addison, DO  OLANZapine (ZYPREXA) 10 MG tablet Take 1.5 tablets (15 mg total) by mouth at bedtime. For mood stability Patient taking differently: Take 10 mg by mouth at bedtime. For mood stability 11/28/18   Cloria Spring, MD  potassium chloride SA (K-DUR,KLOR-CON) 20 MEQ tablet Take 4 tablets (80 mEq total) by mouth 2 (two) times daily. 03/07/19   Nita Sells, MD  thiamine (VITAMIN B-1) 100 MG tablet Take 1 tablet (100 mg total) by mouth daily. Patient taking differently: Take 250 mg by mouth daily.  01/13/19   Cristal Ford, DO  traZODone (DESYREL) 50 MG tablet Take 1 tablet (50 mg total) by mouth at bedtime as needed for sleep. Patient taking differently: Take 50 mg by mouth at bedtime.  11/28/18   Cloria Spring, MD    Family History Family History   Problem Relation Age of Onset  . Breast cancer Mother        deceased  . Heart disease Father   . Depression Daughter   . Anxiety disorder Daughter   . Anxiety disorder Son   . Depression Son   . Asthma Brother   . Heart attack Maternal Aunt   . Heart attack Maternal Uncle   . Heart attack Paternal Aunt   . Heart attack Paternal Uncle   . Heart attack Maternal Grandmother   . Heart attack Maternal Grandfather   . Emphysema Maternal Grandfather   . Heart attack Paternal Grandmother   . Heart attack Paternal Grandfather   . Colon cancer Neg Hx   . Liver disease Neg Hx     Social History Social History   Tobacco Use  . Smoking status: Former Smoker    Packs/day: 1.00    Years: 40.00    Pack years: 40.00    Types: Cigarettes    Start date: 08/04/1971  . Smokeless tobacco: Never Used  . Tobacco comment: peak rate of 2.5ppd, 1/2ppd on 11/27/2016 -- 6 cigarettes / day 01/25/18  Substance Use Topics  . Alcohol use: No    Alcohol/week: 0.0 standard drinks  . Drug use: Not Currently    Types: Marijuana    Comment: most days      Allergies   Augmentin [amoxicillin-pot clavulanate] and Ace inhibitors   Review of Systems Review of Systems  Respiratory: Positive for cough and shortness of breath.   Cardiovascular: Positive for chest pain.  All other systems reviewed and are negative.    Physical Exam Updated Vital Signs BP 105/69   Pulse 77   Resp (!) 23   Ht 5\' 10"  (1.778 m)   Wt 73 kg   SpO2 99%   BMI 23.10 kg/m   Physical Exam Vitals signs and nursing note reviewed.  Constitutional:      General: He is not in acute distress.    Appearance: He is well-developed.     Comments: Appears chronically ill  HENT:     Head: Normocephalic and atraumatic.  Eyes:     Conjunctiva/sclera: Conjunctivae normal.     Pupils: Pupils are equal, round, and reactive to light.  Neck:     Musculoskeletal: Normal range of motion and neck supple.  Cardiovascular:     Rate and  Rhythm: Normal rate and regular rhythm.  Pulses: Normal pulses.  Pulmonary:     Effort: Pulmonary effort is normal. No respiratory distress.     Breath sounds: Decreased breath sounds and rales present. No wheezing.     Comments: Inspiratory and expiratory wheezing throughout the right lobe.  Increased rales in the left lobe. Lung sounds distant on L lobe Abdominal:     General: There is no distension.     Palpations: Abdomen is soft. There is no mass.     Tenderness: There is no abdominal tenderness. There is no guarding or rebound.  Musculoskeletal: Normal range of motion.  Skin:    General: Skin is warm and dry.     Capillary Refill: Capillary refill takes less than 2 seconds.  Neurological:     Mental Status: He is alert and oriented to person, place, and time.      ED Treatments / Results  Labs (all labs ordered are listed, but only abnormal results are displayed) Labs Reviewed  BASIC METABOLIC PANEL - Abnormal; Notable for the following components:      Result Value   Sodium 133 (*)    Chloride 93 (*)    Glucose, Bld 163 (*)    Calcium 8.8 (*)    All other components within normal limits  CBC - Abnormal; Notable for the following components:   RBC 3.98 (*)    Hemoglobin 10.2 (*)    HCT 33.1 (*)    MCH 25.6 (*)    All other components within normal limits  BRAIN NATRIURETIC PEPTIDE - Abnormal; Notable for the following components:   B Natriuretic Peptide 245.0 (*)    All other components within normal limits  DIFFERENTIAL - Abnormal; Notable for the following components:   Abs Immature Granulocytes 0.20 (*)    All other components within normal limits  HEPATIC FUNCTION PANEL - Abnormal; Notable for the following components:   Albumin 3.0 (*)    Alkaline Phosphatase 165 (*)    All other components within normal limits  SARS CORONAVIRUS 2 (HOSPITAL ORDER, PERFORMED IN [facility])  MRSA PCR SCREENING  TROPONIN I  TROPONIN I  PROCALCITONIN     EKG None  Radiology Dg Chest Port 1 View  Result Date: 04/30/2019 CLINICAL DATA:  Chest pain and shortness-of-breath. Patient oxygen dependent. EXAM: PORTABLE CHEST 1 VIEW COMPARISON:  03/27/2019 FINDINGS: Lungs are adequately inflated demonstrate interval improvement with persistent hazy airspace consolidation over the right mid to lower lung. Slight worsening hazy opacification over the right upper lung. Persistent hazy prominence of the central pulmonary vessels. No evidence of effusion. Cardiomediastinal silhouette and remainder the exam is unchanged. IMPRESSION: Interval improvement, but persistent airspace process over the right mid to lower lung with slight worsening hazy density over the right upper lung which may be due to asymmetric edema versus persistent infection. Suggestion of mild vascular congestion Electronically Signed   By: Elberta Fortisaniel  Boyle M.D.   On: 04/30/2019 14:37    Procedures Procedures (including critical care time)  Medications Ordered in ED Medications  sodium chloride flush (NS) 0.9 % injection 3 mL ( Intravenous Canceled Entry 04/30/19 1345)  magnesium sulfate IVPB 2 g 50 mL (has no administration in time range)  vancomycin (VANCOCIN) 1,500 mg in sodium chloride 0.9 % 500 mL IVPB (has no administration in time range)  furosemide (LASIX) injection 20 mg (has no administration in time range)  ceFEPIme (MAXIPIME) 1 g in sodium chloride 0.9 % 100 mL IVPB (1 g Intravenous New Bag/Given 04/30/19 1553)  methylPREDNISolone sodium succinate (SOLU-MEDROL) 125 mg/2 mL injection 125 mg (125 mg Intravenous Given 04/30/19 1549)  albuterol (VENTOLIN HFA) 108 (90 Base) MCG/ACT inhaler 6 puff (6 puffs Inhalation Given 04/30/19 1550)  aspirin EC tablet 81 mg (has no administration in time range)  atorvastatin (LIPITOR) tablet 20 mg (has no administration in time range)  FLUoxetine (PROZAC) capsule 40 mg (has no administration in time range)  divalproex (DEPAKOTE ER) 24 hr tablet 1,500 mg  (has no administration in time range)  gabapentin (NEURONTIN) capsule 400 mg (has no administration in time range)  ipratropium (ATROVENT) nebulizer solution 0.5 mg (has no administration in time range)  albuterol (PROVENTIL) (2.5 MG/3ML) 0.083% nebulizer solution 2.5 mg (has no administration in time range)  multivitamin with minerals tablet 1 tablet (has no administration in time range)  albuterol (VENTOLIN HFA) 108 (90 Base) MCG/ACT inhaler 2 puff (has no administration in time range)  metoprolol tartrate (LOPRESSOR) tablet 25 mg (has no administration in time range)  diltiazem (CARDIZEM CD) 24 hr capsule 180 mg (has no administration in time range)  methocarbamol (ROBAXIN) tablet 500 mg (has no administration in time range)  feeding supplement (ENSURE ENLIVE) (ENSURE ENLIVE) liquid 237 mL (has no administration in time range)  LORazepam (ATIVAN) tablet 1 mg (has no administration in time range)    Or  LORazepam (ATIVAN) injection 1 mg (has no administration in time range)  folic acid (FOLVITE) tablet 1 mg (has no administration in time range)  methylPREDNISolone sodium succinate (SOLU-MEDROL) 40 mg/mL injection 40 mg (has no administration in time range)  guaiFENesin (MUCINEX) 12 hr tablet 600 mg (has no administration in time range)  ipratropium-albuterol (DUONEB) 0.5-2.5 (3) MG/3ML nebulizer solution 3 mL (has no administration in time range)     Initial Impression / Assessment and Plan / ED Course  I have reviewed the triage vital signs and the nursing notes.  Pertinent labs & imaging results that were available during my care of the patient were reviewed by me and considered in my medical decision making (see chart for details).        Resenting for evaluation of chest pain and shortness of breath.  Physical exam shows chronically ill man on oxygen.  Concerning history with multiple rounds of multifocal/H CAP pneumonia.  Has been intubated multiple times for this.  Also severe  COPD and a history of CHF.  Pulmonary exam concerning, wheezing throughout with decreased sounds on the left side with crackles.  Concern for pneumonia, CHF, COPD.  Also consider ACS cause of patient's chest pain.  Will obtain labs, chest x-ray, EKG, and reassess. Dr. Adriana Simas evaluated the pt and agrees to plan.  Labs reassuring and that troponin is negative and no leukocytosis.  BNP mildly elevated to 45, but improved from previous.  Patient's blood pressure trending down while he is asleep, getting to the 80s systolic.  EKG without STEMI.  Chest x-ray viewed interpreted by me, shows continued pneumonia, and concern for increasing infection versus edema of the right upper lobe.  I am concerned about patient's respiratory status if he is to go home, I predict he will continue to decline.  No longer on any antibiotics at home.  Will cover for H CAP pneumonia, give Solu-Medrol, mag, and albuterol for COPD.  Case discussed with Dr. Estell Harpin who agrees to plan. Will call for admission  Discussed with [facility] Mariea Clonts from [facility], pt to be admitted for sob/cp with concern for pna vs copd.   Final Clinical Impressions(s) /  ED Diagnoses   Final diagnoses:  Chest pain    ED Discharge Orders    None       Alveria Apley, PA-C 04/30/19 1651    Donnetta Hutching, MD 05/04/19 [phone]

## 2019-04-30 NOTE — Progress Notes (Signed)
Pharmacy Antibiotic Note  Jeffrey [name] is a 58 y.o. male admitted on 04/30/2019 with pneumonia.  Pharmacy has been consulted for Vancomycin and Cefepime dosing.  Plan: Vancomycin 1000 mg IV every 12 hours.  Goal trough 15-20 mcg/mL.  Cefepime 2000 mg IV every 8 hours. Monitor labs, c/s, and patient improvement.  Height: 5\' 10"  (177.8 cm) Weight: 161 lb (73 kg) IBW/kg (Calculated) : 73  No data recorded.  Recent Labs  Lab 04/30/19 1340  WBC 7.9  CREATININE 0.69    Estimated Creatinine Clearance: 103.9 mL/min (by C-G formula based on SCr of 0.69 mg/dL).    Allergies  Allergen Reactions  . Augmentin [Amoxicillin-Pot Clavulanate] Rash and Other (See Comments)    Has patient had a PCN reaction causing immediate rash, facial/tongue/throat swelling, SOB or lightheadedness with hypotension: No Has patient had a PCN reaction causing severe rash involving mucus membranes or skin necrosis: No Has patient had a PCN reaction that required hospitalization No Has patient had a PCN reaction occurring within the last 10 years: Yes If all of the above answers are "NO", then may proceed with Cephalosporin use.  . Ace Inhibitors Hives    Antimicrobials this admission: Vanco 6/7 >>  Cefepime 6/7 >>   Dose adjustments this admission: N/A  Microbiology results: 6/7 MRSA PCR: pending  Thank you for allowing pharmacy to be a part of this patient's care.  Ramond Craver 04/30/2019 5:21 PM

## 2019-05-01 LAB — BASIC METABOLIC PANEL
Anion gap: 12 (ref 5–15)
BUN: 12 mg/dL (ref 6–20)
CO2: 27 mmol/L (ref 22–32)
Calcium: 8.5 mg/dL — ABNORMAL LOW (ref 8.9–10.3)
Chloride: 90 mmol/L — ABNORMAL LOW (ref 98–111)
Creatinine, Ser: 0.57 mg/dL — ABNORMAL LOW (ref 0.61–1.24)
GFR calc Af Amer: >60 mL/min (ref >60)
GFR calc non Af Amer: >60 mL/min (ref >60)
Glucose, Bld: 206 mg/dL — ABNORMAL HIGH (ref 70–99)
Potassium: 4.5 mmol/L (ref 3.5–5.1)
Sodium: 129 mmol/L — ABNORMAL LOW (ref 135–145)

## 2019-05-01 LAB — SODIUM, URINE, RANDOM: Sodium, Ur: 17 mmol/L

## 2019-05-01 LAB — TROPONIN I: Troponin I: <0.03 ng/mL (ref <0.03)

## 2019-05-01 LAB — CBC
HCT: 31.8 % — ABNORMAL LOW (ref 39.0–52.0)
Hemoglobin: 9.9 g/dL — ABNORMAL LOW (ref 13.0–17.0)
MCH: 25.6 pg — ABNORMAL LOW (ref 26.0–34.0)
MCHC: 31.1 g/dL (ref 30.0–36.0)
MCV: 82.2 fL (ref 80.0–100.0)
Platelets: 359 10*3/uL (ref 150–400)
RBC: 3.87 MIL/uL — ABNORMAL LOW (ref 4.22–5.81)
RDW: 13.9 % (ref 11.5–15.5)
WBC: 9.8 10*3/uL (ref 4.0–10.5)
nRBC: 0 % (ref 0.0–0.2)

## 2019-05-01 LAB — TSH: TSH: 1.552 u[IU]/mL (ref 0.350–4.500)

## 2019-05-01 LAB — MRSA PCR SCREENING: MRSA by PCR: NEGATIVE

## 2019-05-01 MED ORDER — METHYLPREDNISOLONE SODIUM SUCC 40 MG IJ SOLR
40.0000 mg | Freq: Two times a day (BID) | INTRAMUSCULAR | Status: DC
  Administered 2019-05-01 – 2019-05-02 (×2): 40 mg via INTRAVENOUS
  Filled 2019-05-01 (×2): qty 1

## 2019-05-01 MED ORDER — IPRATROPIUM-ALBUTEROL 0.5-2.5 (3) MG/3ML IN SOLN
3.0000 mL | Freq: Three times a day (TID) | RESPIRATORY_TRACT | Status: DC
  Administered 2019-05-01 – 2019-05-02 (×3): 3 mL via RESPIRATORY_TRACT
  Filled 2019-05-01 (×3): qty 3

## 2019-05-01 MED ORDER — IPRATROPIUM-ALBUTEROL 0.5-2.5 (3) MG/3ML IN SOLN
3.0000 mL | Freq: Four times a day (QID) | RESPIRATORY_TRACT | Status: DC
  Administered 2019-05-01: 3 mL via RESPIRATORY_TRACT
  Filled 2019-05-01: qty 3

## 2019-05-01 NOTE — Progress Notes (Signed)
PROGRESS NOTE    Jeffrey [name]  UVO:[id number] DOB: 1961-11-09 DOA: 04/30/2019 PCP: Gareth Morgan, MD   Brief Narrative:  Per HPI: Jeffrey [name]  is a 58 y.o. male who is a reformed smoker  (quit 2020) and a reformed alcoholic (quit 2020) with pmhx relevant for chronic hypoxic respiratory failure on 5 L of oxygen at baseline at home, severe  COPD (requiring intubation previously), [name], HTN, [name], [name], depression/anxiety, bipolar disorder, dyslipidemia, PAD ED with complaints of worsening shortness of breath for the last couple days... Chest discomfort earlier but no pleuritic symptoms per se.... No leg pains, patient endorses orthopnea and dyspnea on exertion.. No fever, cough is nonproductive no vomiting no diarrhea no abdominal pain  Patient apparently actively using nebulizer treatments with some improvement, he does not have diuretics at home..   Assessment & Plan:   Principal Problem:   Acute on chronic respiratory failure with hypoxia (HCC) Active Problems:   [name] (peripheral vascular disease) (HCC)   Chronic diastolic CHF (congestive heart failure) (HCC)   COPD exacerbation (HCC)   Essential hypertension   1) acute on chronic hypoxic respiratory failure----patient appears to be back to baseline 5 L nasal cannula at this time.  Continue diuresis as tolerated with IV.  2)HFpEF/acute exacerbation of chronic diastolic dysfunction CHF--- last known EF 65 to 70% based on echo from 12/19/2018 with grade through diastolic dysfunction and moderate mitral stenosis----appears to be improving.  Continue to diurese and monitor repeat labs in a.m.  3)Acute COPD exacerbation----no definite pneumonia, no fevers, no leukocytosis, procalcitonin is not elevated, patient with severe COPD at baseline, previously required intubation, give IV Solu-Medrol, mucolytics, bronchodilators and doxycycline.  Wean IV steroids further to twice daily dosing today as he is improved.  4) bipolar disorder---  stable, continue Depakote for mood, continue Prozac Zyprexa and trazodone nightly  5)H/o alcohol and tobacco abuse--- patient is congratulated on being abstinent from both alcohol and tobacco, encouraged to stay abstinent, continue folic acid and multivitamin  6)H/o PAD--stable, continue aspirin and Lipitor    DVT prophylaxis: Heparin Code Status: Full Family Communication: None at bedside Disposition Plan: Continue diuresis as well as treatment of COPD exacerbation with plan discharge in 24 hours if further improved.   Consultants:   None  Procedures:   None  Antimicrobials:  Anti-infectives (From admission, onward)   Start     Dose/Rate Route Frequency Ordered Stop   05/01/19 0700  vancomycin (VANCOCIN) IVPB 1000 mg/200 mL premix  Status:  Discontinued     1,000 mg 200 mL/hr over 60 Minutes Intravenous Every 12 hours 04/30/19 1718 04/30/19 1719   05/01/19 0700  vancomycin (VANCOCIN) IVPB 1000 mg/200 mL premix  Status:  Discontinued     1,000 mg 200 mL/hr over 60 Minutes Intravenous Every 12 hours 04/30/19 1719 04/30/19 2101   05/01/19 0000  ceFEPIme (MAXIPIME) 2 g in sodium chloride 0.9 % 100 mL IVPB  Status:  Discontinued     2 g 200 mL/hr over 30 Minutes Intravenous Every 8 hours 04/30/19 1714 04/30/19 2101   04/30/19 2130  doxycycline (VIBRA-TABS) tablet 100 mg     100 mg Oral 2 times daily 04/30/19 2101     04/30/19 1600  vancomycin (VANCOCIN) 1,500 mg in sodium chloride 0.9 % 500 mL IVPB     1,500 mg 250 mL/hr over 120 Minutes Intravenous  Once 04/30/19 1552 04/30/19 1922   04/30/19 1545  ceFEPIme (MAXIPIME) 1 g in sodium chloride 0.9 % 100 mL IVPB  1 g 200 mL/hr over 30 Minutes Intravenous  Once 04/30/19 1535 04/30/19 1728       Subjective: Patient seen and evaluated today with no new acute complaints or concerns. No acute concerns or events noted overnight.  He states that his breathing and coughing have overall improved this morning.  Objective:  Vitals:   04/30/19 2120 05/01/19 0010 05/01/19 0612 05/01/19 0736  BP:  109/73 104/67   Pulse:  81 81   Resp:  19 19   Temp:  98 F (36.7 C) 98.4 F (36.9 C)   TempSrc:  Oral Oral   SpO2: 95% 98% 98% 95%  Weight:      Height:        Intake/Output Summary (Last 24 hours) at 05/01/2019 1126 Last data filed at 05/01/2019 0500 Gross per 24 hour  Intake 483 ml  Output 1550 ml  Net -1067 ml   Filed Weights   04/30/19 1324 04/30/19 1839  Weight: 73 kg 70.8 kg    Examination:  General exam: Appears calm and comfortable  Respiratory system: Clear to auscultation. Respiratory effort normal.  Currently on 4 L nasal cannula with minimal bilateral wheezing noted. Cardiovascular system: S1 & S2 heard, RRR. No JVD, murmurs, rubs, gallops or clicks. No pedal edema. Gastrointestinal system: Abdomen is nondistended, soft and nontender. No organomegaly or masses felt. Normal bowel sounds heard. Central nervous system: Alert and oriented. No focal neurological deficits. Extremities: Symmetric 5 x 5 power. Skin: No rashes, lesions or ulcers Psychiatry: Judgement and insight appear normal. Mood & affect appropriate.     Data Reviewed: I have personally reviewed following labs and imaging studies  CBC: Recent Labs  Lab 04/30/19 1340 05/01/19 0616  WBC 7.9 9.8  NEUTROABS 6.4  --   HGB 10.2* 9.9*  HCT 33.1* 31.8*  MCV 83.2 82.2  PLT 365 359   Basic Metabolic Panel: Recent Labs  Lab 04/30/19 1340 05/01/19 0616  NA 133* 129*  K 4.3 4.5  CL 93* 90*  CO2 28 27  GLUCOSE 163* 206*  BUN 7 12  CREATININE 0.69 0.57*  CALCIUM 8.8* 8.5*   GFR: Estimated Creatinine Clearance: 100.8 mL/min (A) (by C-G formula based on SCr of 0.57 mg/dL (L)). Liver Function Tests: Recent Labs  Lab 04/30/19 1340  AST 25  ALT 24  ALKPHOS 165*  BILITOT 0.4  PROT 6.5  ALBUMIN 3.0*   No results for input(s): LIPASE, AMYLASE in the last 168 hours. No results for input(s): AMMONIA in the last 168  hours. Coagulation Profile: No results for input(s): INR, PROTIME in the last 168 hours. Cardiac Enzymes: Recent Labs  Lab 04/30/19 1340 04/30/19 1631 05/01/19 0019  TROPONINI <0.03 <0.03 <0.03   BNP (last 3 results) No results for input(s): PROBNP in the last 8760 hours. HbA1C: No results for input(s): HGBA1C in the last 72 hours. CBG: No results for input(s): GLUCAP in the last 168 hours. Lipid Profile: No results for input(s): CHOL, HDL, LDLCALC, TRIG, CHOLHDL, LDLDIRECT in the last 72 hours. Thyroid Function Tests: No results for input(s): TSH, T4TOTAL, FREET4, T3FREE, THYROIDAB in the last 72 hours. Anemia Panel: No results for input(s): VITAMINB12, FOLATE, FERRITIN, TIBC, IRON, RETICCTPCT in the last 72 hours. Sepsis Labs: Recent Labs  Lab 04/30/19 1631  PROCALCITON <0.10    Recent Results (from the past 240 hour(s))  SARS Coronavirus 2 (CEPHEID - Performed in [facility] hospital lab), Hosp Order     Status: None   Collection Time: 04/30/19  3:41 PM  Result Value Ref Range Status   SARS Coronavirus 2 NEGATIVE NEGATIVE Final    Comment: (NOTE) If result is NEGATIVE SARS-CoV-2 target nucleic acids are NOT DETECTED. The SARS-CoV-2 RNA is generally detectable in upper and lower  respiratory specimens during the acute phase of infection. The lowest  concentration of SARS-CoV-2 viral copies this assay can detect is 250  copies / mL. A negative result does not preclude SARS-CoV-2 infection  and should not be used as the sole basis for treatment or other  patient management decisions.  A negative result may occur with  improper specimen collection / handling, submission of specimen other  than nasopharyngeal swab, presence of viral mutation(s) within the  areas targeted by this assay, and inadequate number of viral copies  (<250 copies / mL). A negative result must be combined with clinical  observations, patient history, and epidemiological information. If result is  POSITIVE SARS-CoV-2 target nucleic acids are DETECTED. The SARS-CoV-2 RNA is generally detectable in upper and lower  respiratory specimens dur ing the acute phase of infection.  Positive  results are indicative of active infection with SARS-CoV-2.  Clinical  correlation with patient history and other diagnostic information is  necessary to determine patient infection status.  Positive results do  not rule out bacterial infection or co-infection with other viruses. If result is PRESUMPTIVE POSTIVE SARS-CoV-2 nucleic acids MAY BE PRESENT.   A presumptive positive result was obtained on the submitted specimen  and confirmed on repeat testing.  While 2019 novel coronavirus  (SARS-CoV-2) nucleic acids may be present in the submitted sample  additional confirmatory testing may be necessary for epidemiological  and / or clinical management purposes  to differentiate between  SARS-CoV-2 and other Sarbecovirus currently known to infect humans.  If clinically indicated additional testing with an alternate test  methodology [phone]) is advised. The SARS-CoV-2 RNA is generally  detectable in upper and lower respiratory sp ecimens during the acute  phase of infection. The expected result is Negative. Fact Sheet for Patients:  BoilerBrush.com.cy Fact Sheet for Healthcare Providers: https://pope.com/ This test is not yet approved or cleared by the [place] FDA and has been authorized for detection and/or diagnosis of SARS-CoV-2 by FDA under an Emergency Use Authorization (EUA).  This EUA will remain in effect (meaning this test can be used) for the duration of the COVID-19 declaration under Section 564(b)(1) of the Act, 21 U.S.C. section 360bbb-3(b)(1), unless the authorization is terminated or revoked sooner. Performed at [facility], [address] [place], [place] [address]          Radiology Studies: Dg Chest [facility] 1 View  Result  Date: 04/30/2019 CLINICAL DATA:  Chest pain and shortness-of-breath. Patient oxygen dependent. EXAM: PORTABLE CHEST 1 VIEW COMPARISON:  03/27/2019 FINDINGS: Lungs are adequately inflated demonstrate interval improvement with persistent hazy airspace consolidation over the right mid to lower lung. Slight worsening hazy opacification over the right upper lung. Persistent hazy prominence of the central pulmonary vessels. No evidence of effusion. Cardiomediastinal silhouette and remainder the exam is unchanged. IMPRESSION: Interval improvement, but persistent airspace process over the right mid to lower lung with slight worsening hazy density over the right upper lung which may be due to asymmetric edema versus persistent infection. Suggestion of mild vascular congestion Electronically Signed   By: Elberta Fortis M.D.   On: 04/30/2019 14:37        Scheduled Meds: . aspirin EC  81 mg Oral QPM  . atorvastatin  20 mg Oral q1800  . diltiazem  180 mg Oral Daily  . divalproex  1,500 mg Oral QHS  . doxycycline  100 mg Oral BID  . feeding supplement (ENSURE ENLIVE)  237 mL Oral BID BM  . FLUoxetine  40 mg Oral Daily  . folic acid  1 mg Oral Daily  . furosemide  20 mg Intravenous Q12H  . gabapentin  400 mg Oral Daily   And  . gabapentin  800 mg Oral QHS  . guaiFENesin  600 mg Oral BID  . heparin  5,000 Units Subcutaneous Q8H  . ipratropium-albuterol  3 mL Nebulization TID  . methocarbamol  500 mg Oral TID  . methylPREDNISolone (SOLU-MEDROL) injection  40 mg Intravenous Q8H  . metoprolol tartrate  25 mg Oral BID  . multivitamin with minerals  1 tablet Oral Daily  . OLANZapine  15 mg Oral QHS  . potassium chloride SA  80 mEq Oral BID  . sodium chloride flush  3 mL Intravenous Once  . sodium chloride flush  3 mL Intravenous Q12H  . thiamine  100 mg Oral Daily   Or  . thiamine  100 mg Intravenous Daily  . traZODone  50 mg Oral QHS   Continuous Infusions: . sodium chloride       LOS: 1 day     Time spent: 30 minutes    [place] Cookson Hoover Brunette, DO [facility] Pager [phone]  If 7PM-7AM, please contact night-coverage [email or web address] Password [facility] 05/01/2019, 11:26 AM

## 2019-05-01 NOTE — TOC Initial Note (Signed)
Transition of Care [facility]) - Initial/Assessment Note    Patient Details  Name: Jeffrey [name] MRN: [id number] Date of Birth: [birth year]  Transition of [facility]) CM/SW Contact:    [name], LCSW Phone Number: 05/01/2019, 2:37 PM  Clinical Narrative:                  Met with pt to assess. Pt alert and oriented x4. He states that he is independent in ADLs at home. He does not drive. Pt states that his wife takes him to appointments and to pick up his medications. Pt has had [facility] and this was to be their last week. Will speak with Vaughan Basta at [facility] to see if they can extend their care of pt at dc. Pt does not anticipate any other dc needs.  TOC will follow.  Expected Discharge Plan: [place] Barriers to Discharge: Continued Medical Work up   Patient Goals and CMS Choice        Expected Discharge Plan and Services Expected Discharge Plan: [place] In-house Referral: Clinical Social Work     Living arrangements for the past 2 months: [place]                                      Prior Living Arrangements/Services Living arrangements for the past 2 months: Single Family Home Lives with:: Spouse Patient language and need for interpreter reviewed:: Yes Do you feel safe going back to the place where you live?: Yes      Need for Family Participation in Patient Care: Yes (Comment) Care giver support system in place?: Yes (comment) Current home services: Home RN Criminal Activity/Legal Involvement Pertinent to Current Situation/Hospitalization: No - Comment as needed  Activities of Daily Living Home Assistive Devices/Equipment: Oxygen, Walker (specify type) ADL Screening (condition at time of admission) Patient's cognitive ability adequate to safely complete daily activities?: Yes Is the patient deaf or have difficulty hearing?: Yes Does the patient have difficulty seeing, even when wearing  glasses/contacts?: No Does the patient have difficulty concentrating, remembering, or making decisions?: No Patient able to express need for assistance with ADLs?: Yes Does the patient have difficulty dressing or bathing?: Yes Independently performs ADLs?: Yes (appropriate for developmental age) Does the patient have difficulty walking or climbing stairs?: Yes Weakness of Legs: Both Weakness of Arms/Hands: Both  Permission Sought/Granted                  Emotional Assessment Appearance:: Appears stated age Attitude/Demeanor/Rapport: Engaged Affect (typically observed): Calm, Pleasant Orientation: : Oriented to Self, Oriented to Place, Oriented to  Time, Oriented to Situation Alcohol / Substance Use: Not Applicable Psych Involvement: No (comment)  Admission diagnosis:  Shortness of breath [R06.02] Atypical chest pain [R07.89] Chest pain [R07.9] Chronic obstructive pulmonary disease, unspecified COPD type ([place]) [J44.9] Patient Active Problem List   Diagnosis Date Noted  . Respiratory failure with hypoxia ([place]) 04/30/2019  . Acute on chronic respiratory failure with hypoxia ([place]) 04/30/2019  . Bronchiectasis ([place]) 03/24/2019  . Fever 03/24/2019  . Cough   . Dysphagia   . Palliative care by specialist   . Goals of care, counseling/discussion   . Hepatic encephalopathy ([place])   . COPD exacerbation ([place]) 09/16/2018  . Essential hypertension 09/16/2018  . GERD (gastroesophageal reflux disease) 09/16/2018  . Hypoxia   . Chronic diastolic CHF (congestive  heart failure) ([place])   . Bipolar disorder with severe depression ([place]) 02/01/2018  . Alcohol use disorder, severe, dependence ([place]) 02/01/2018  . Cannabis use disorder, severe, dependence ([place]) 02/01/2018  . MDD (major depressive disorder), recurrent episode, severe ([place]) 01/31/2018  . Internal and external bleeding hemorrhoids   . Rectal bleeding   . Iron deficiency anemia due to chronic blood loss 03/09/2017  . Anemia   .  GIB (gastrointestinal bleeding) 02/07/2017  . Peptic ulcer disease   . Bipolar disorder ([place])   . COPD, severe ([place]) 10/09/2016  . H/O: depression 08/26/2016  . Dyslipidemia 08/26/2016  . Tobacco use disorder 08/26/2016  . [name] (peripheral vascular disease) ([place]) 06/18/2015  . Chronic diarrhea 05/02/2013  . Hematochezia 05/02/2013  . Abnormal weight loss 05/02/2013  . Abdominal pain, periumbilical 98/42/1031   PCP:  Lemmie Evens, MD Pharmacy:   [place], [place] - [place] [address] [place] [address] [place] [address] Phone: [phone] Fax: [phone]  [place], [place] [place] [place] [address] Phone: [phone] Fax: [phone]     Social Determinants of Health (SDOH) Interventions    Readmission Risk Interventions Readmission Risk Prevention Plan 03/30/2019 03/28/2019 02/20/2019  Transportation Screening - Complete Complete  Medication Review (RN Care Manager) - Complete Not Complete  PCP or Specialist appointment within 3-5 days of discharge Not Complete - Complete  PCP/Specialist Appt Not Complete comments recommended for 1 week f/u, PCP closed today, patient to call tomorrow and make appt.  - -  HRI or Home Care Consult - Complete (No Data)  SW Recovery Care/Counseling Consult - Complete (No Data)  Palliative Care Screening - Not Applicable Not Applicable  Skilled Nursing Facility - Not Applicable Not Applicable  Some recent data might be hidden

## 2019-05-02 LAB — BASIC METABOLIC PANEL
Anion gap: 11 (ref 5–15)
BUN: 15 mg/dL (ref 6–20)
CO2: 26 mmol/L (ref 22–32)
Calcium: 9 mg/dL (ref 8.9–10.3)
Chloride: 90 mmol/L — ABNORMAL LOW (ref 98–111)
Creatinine, Ser: 0.55 mg/dL — ABNORMAL LOW (ref 0.61–1.24)
GFR calc Af Amer: >60 mL/min (ref >60)
GFR calc non Af Amer: >60 mL/min (ref >60)
Glucose, Bld: 142 mg/dL — ABNORMAL HIGH (ref 70–99)
Potassium: 5.5 mmol/L — ABNORMAL HIGH (ref 3.5–5.1)
Sodium: 127 mmol/L — ABNORMAL LOW (ref 135–145)

## 2019-05-02 LAB — MAGNESIUM: Magnesium: 1.9 mg/dL (ref 1.7–2.4)

## 2019-05-02 MED ORDER — FUROSEMIDE 20 MG PO TABS: 40.0000 mg | ORAL_TABLET | Freq: Every day | ORAL | 0 refills | Status: DC

## 2019-05-02 MED ORDER — DOXYCYCLINE HYCLATE 100 MG PO TABS: 100.0000 mg | ORAL_TABLET | Freq: Two times a day (BID) | ORAL | 0 refills | Status: AC

## 2019-05-02 MED ORDER — PREDNISONE 20 MG PO TABS: 40.0000 mg | ORAL_TABLET | Freq: Every day | ORAL | 0 refills | Status: AC

## 2019-05-02 MED ORDER — GUAIFENESIN ER 600 MG PO TB12: 600.0000 mg | ORAL_TABLET | Freq: Two times a day (BID) | ORAL | 0 refills | Status: AC

## 2019-05-02 NOTE — Discharge Summary (Signed)
Physician Discharge Summary  Jeffrey [name] WUJ:[id number] DOB: 11-Mar-1961 DOA: 04/30/2019  PCP: Gareth Morgan, MD  Admit date: 04/30/2019  Discharge date: 05/02/2019  Admitted From:Home  Disposition:  Home  Recommendations for Outpatient Follow-up:  Follow up with PCP in 3-5 days and repeat BMP while on Lasix 40mg  daily Finish course of prednisone as prescribed Monitor daily weights as recommended  Home Health: Yes, with RN to help monitor symptoms  Equipment/Devices: Has home 5 L nasal cannula  Discharge Condition: Stable  CODE STATUS: Full  Diet recommendation: Heart Healthy  Brief/Interim Summary: Per HPI: [name], [name],[name], HTN, [name], [name],depression/anxiety, bipolar disorder, dyslipidemia,[name] with complaints of worsening shortness of breath for the last couple days.Marland KitchenMarland KitchenChest discomfort earlier but no pleuritic symptoms per se.Marland KitchenMarland KitchenMarland KitchenNo leg pains, patient endorses orthopnea and dyspnea on exertion..No fever, cough is nonproductive no vomiting no diarrhea no abdominal pain  Patient apparently actively using nebulizer treatments with some improvement, and was continue to use Lasix 20 mg daily at home with minimal improvement.  Patient was admitted with acute on chronic hypoxemic respiratory failure secondary to acute COPD exacerbation as well as acute on chronic diastolic CHF exacerbation.  Patient was started on Lasix IV 20 mg twice daily and has had approximately 1.7 L net negative fluid balance during this admission and is overall feeling much better and is currently on 4 L nasal cannula with no other symptomatology such as edema or shortness of breath or chest pain.  His weight on discharge is noted to be 162 pounds, but doubt accuracy.  He is  ready to go home and is agreeable to Lasix dose be increased to 40 mg daily from his usual 20 mg and will be on prednisone taper.  He has no further coughing or chest tightness and is otherwise stable with no other acute events noted during the course of this admission.  He will have home health RN for further follow-up and agrees to monitor daily weights and check baseline at home and call MD as needed.  He will need close follow-up with his PCP in the next 3 to 5 days to recheck serum sodium and creatinine levels while on his increased dose of home Lasix.  He does have some hyponatremia with serum sodium of 127 but is otherwise asymptomatic and this is likely due to some residual hypervolemia which can be treated with a higher dose of Lasix at home.  His TSH and urine sodium are also noted to be within normal limits.  He has been advised on fluid restriction of approximately 2 L or less at home as well.  Discharge Diagnoses:  Principal Problem:   Acute on chronic respiratory failure with hypoxia (HCC) Active Problems:   [name] (peripheral vascular disease) (HCC)   Chronic diastolic CHF (congestive heart failure) (HCC)   COPD exacerbation (HCC)   Essential hypertension  Principal discharge diagnosis: Acute on chronic hypoxemic respiratory failure secondary to COPD exacerbation as well as acute on chronic diastolic heart failure.  Discharge Instructions  Discharge Instructions    (HEART FAILURE PATIENTS) Call MD:  Anytime you have any of the following symptoms: 1) 3 pound weight gain in 24 hours or 5 pounds in 1 week 2) shortness of breath, with or without a dry hacking cough 3) swelling in the hands, feet or stomach 4) if you have to sleep  on extra pillows at night in order to breathe.   Complete by:  As directed    Diet - low sodium heart healthy   Complete by:  As directed    Increase activity slowly   Complete by:  As directed      Allergies as of 05/02/2019      Reactions   Augmentin  [amoxicillin-pot Clavulanate] Rash, Other (See Comments)   Has patient had a PCN reaction causing immediate rash, facial/tongue/throat swelling, SOB or lightheadedness with hypotension: No Has patient had a PCN reaction causing severe rash involving mucus membranes or skin necrosis: No Has patient had a PCN reaction that required hospitalization No Has patient had a PCN reaction occurring within the last 10 years: Yes If all of the above answers are "NO", then may proceed with Cephalosporin use.   Ace Inhibitors Hives      Medication List    TAKE these medications   albuterol (2.5 MG/3ML) 0.083% nebulizer solution Commonly known as:  PROVENTIL Take 3 mLs (2.5 mg total) by nebulization every 4 (four) hours as needed for shortness of breath.   albuterol 108 (90 Base) MCG/ACT inhaler Commonly known as:  ProAir HFA Inhale 2 puffs into the lungs every 4 (four) hours as needed for wheezing or shortness of breath.   aspirin EC 81 MG tablet Take 81 mg by mouth every evening.   atorvastatin 20 MG tablet Commonly known as:  LIPITOR Take 1 tablet (20 mg total) by mouth daily. For high cholesterol What changed:  when to take this   diltiazem 180 MG 24 hr capsule Commonly known as:  CARDIZEM CD Take 1 capsule (180 mg total) by mouth daily.   divalproex 500 MG 24 hr tablet Commonly known as:  DEPAKOTE ER Take 3 tablets (1,500 mg total) by mouth at bedtime.   doxycycline 100 MG tablet Commonly known as:  VIBRA-TABS Take 1 tablet (100 mg total) by mouth 2 (two) times daily for 5 days.   feeding supplement (ENSURE ENLIVE) Liqd Take 237 mLs by mouth 2 (two) times daily between meals.   FLUoxetine 40 MG capsule Commonly known as:  PROZAC Take 1 capsule (40 mg total) by mouth daily. For mood control   furosemide 20 MG tablet Commonly known as:  LASIX Take 2 tablets (40 mg total) by mouth daily for 30 days. What changed:  how much to take   gabapentin 400 MG capsule Commonly known as:   NEURONTIN Take 1 capsule (400 mg total) by mouth 3 (three) times daily.   guaiFENesin 600 MG 12 hr tablet Commonly known as:  MUCINEX Take 1 tablet (600 mg total) by mouth 2 (two) times daily for 10 days.   ipratropium 0.02 % nebulizer solution Commonly known as:  ATROVENT Take 2.5 mLs (0.5 mg total) by nebulization 3 (three) times daily.   methocarbamol 500 MG tablet Commonly known as:  ROBAXIN Take 1 tablet by mouth 3 (three) times daily.   metoprolol tartrate 25 MG tablet Commonly known as:  LOPRESSOR Take 1 tablet (25 mg total) by mouth 2 (two) times daily. What changed:    how much to take  when to take this   multivitamin with minerals Tabs tablet Take 1 tablet by mouth daily.   OLANZapine 10 MG tablet Commonly known as:  ZYPREXA Take 1.5 tablets (15 mg total) by mouth at bedtime. For mood stability   predniSONE 20 MG tablet Commonly known as:  Deltasone Take 2 tablets (40 mg total) by  mouth daily for 5 days. What changed:    medication strength  how much to take  when to take this   traZODone 50 MG tablet Commonly known as:  DESYREL Take 1 tablet (50 mg total) by mouth at bedtime as needed for sleep. What changed:  when to take this      Follow-up Information    Gareth Morgan, MD Follow up in 3 day(s).   Specialty:  Family Medicine Contact information: [address] [place] [address] [phone]          Allergies  Allergen Reactions  . Augmentin [Amoxicillin-Pot Clavulanate] Rash and Other (See Comments)    Has patient had a PCN reaction causing immediate rash, facial/tongue/throat swelling, SOB or lightheadedness with hypotension: No Has patient had a PCN reaction causing severe rash involving mucus membranes or skin necrosis: No Has patient had a PCN reaction that required hospitalization No Has patient had a PCN reaction occurring within the last 10 years: Yes If all of the above answers are "NO", then may proceed with  Cephalosporin use.  Donivan Scull Inhibitors Hives    Consultations:  None   Procedures/Studies: Dg Chest Port 1 View  Result Date: 04/30/2019 CLINICAL DATA:  Chest pain and shortness-of-breath. Patient oxygen dependent. EXAM: PORTABLE CHEST 1 VIEW COMPARISON:  03/27/2019 FINDINGS: Lungs are adequately inflated demonstrate interval improvement with persistent hazy airspace consolidation over the right mid to lower lung. Slight worsening hazy opacification over the right upper lung. Persistent hazy prominence of the central pulmonary vessels. No evidence of effusion. Cardiomediastinal silhouette and remainder the exam is unchanged. IMPRESSION: Interval improvement, but persistent airspace process over the right mid to lower lung with slight worsening hazy density over the right upper lung which may be due to asymmetric edema versus persistent infection. Suggestion of mild vascular congestion Electronically Signed   By: Elberta Fortis M.D.   On: 04/30/2019 14:37     Discharge Exam: Vitals:   05/02/19 0559 05/02/19 0755  BP: (!) 130/99   Pulse: 87   Resp: 20   Temp: 98.4 F (36.9 C)   SpO2: 97% 96%   Vitals:   05/02/19 0100 05/02/19 0559 05/02/19 0603 05/02/19 0755  BP: 111/73 (!) 130/99    Pulse: 84 87    Resp: 20 20    Temp: 98.1 F (36.7 C) 98.4 F (36.9 C)    TempSrc: Oral Oral    SpO2: 96% 97%  96%  Weight:   73.5 kg   Height:        General: Pt is alert, awake, not in acute distress Cardiovascular: RRR, S1/S2 +, no rubs, no gallops Respiratory: CTA bilaterally, no wheezing, no rhonchi, currently on 4 L nasal cannula Abdominal: Soft, NT, ND, bowel sounds + Extremities: no edema, no cyanosis    The results of significant diagnostics from this hospitalization (including imaging, microbiology, ancillary and laboratory) are listed below for reference.     Microbiology: Recent Results (from the past 240 hour(s))  SARS Coronavirus 2 (CEPHEID - Performed in [facility] hospital  lab), Hosp Order     Status: None   Collection Time: 04/30/19  3:41 PM  Result Value Ref Range Status   SARS Coronavirus 2 NEGATIVE NEGATIVE Final    Comment: (NOTE) If result is NEGATIVE SARS-CoV-2 target nucleic acids are NOT DETECTED. The SARS-CoV-2 RNA is generally detectable in upper and lower  respiratory specimens during the acute phase of infection. The lowest  concentration of SARS-CoV-2 viral copies this  assay can detect is 250  copies / mL. A negative result does not preclude SARS-CoV-2 infection  and should not be used as the sole basis for treatment or other  patient management decisions.  A negative result may occur with  improper specimen collection / handling, submission of specimen other  than nasopharyngeal swab, presence of viral mutation(s) within the  areas targeted by this assay, and inadequate number of viral copies  (<250 copies / mL). A negative result must be combined with clinical  observations, patient history, and epidemiological information. If result is POSITIVE SARS-CoV-2 target nucleic acids are DETECTED. The SARS-CoV-2 RNA is generally detectable in upper and lower  respiratory specimens dur ing the acute phase of infection.  Positive  results are indicative of active infection with SARS-CoV-2.  Clinical  correlation with patient history and other diagnostic information is  necessary to determine patient infection status.  Positive results do  not rule out bacterial infection or co-infection with other viruses. If result is PRESUMPTIVE POSTIVE SARS-CoV-2 nucleic acids MAY BE PRESENT.   A presumptive positive result was obtained on the submitted specimen  and confirmed on repeat testing.  While 2019 novel coronavirus  (SARS-CoV-2) nucleic acids may be present in the submitted sample  additional confirmatory testing may be necessary for epidemiological  and / or clinical management purposes  to differentiate between  SARS-CoV-2 and other Sarbecovirus  currently known to infect humans.  If clinically indicated additional testing with an alternate test  methodology [phone]) is advised. The SARS-CoV-2 RNA is generally  detectable in upper and lower respiratory sp ecimens during the acute  phase of infection. The expected result is Negative. Fact Sheet for Patients:  BoilerBrush.com.cy Fact Sheet for Healthcare Providers: https://pope.com/ This test is not yet approved or cleared by the [place] FDA and has been authorized for detection and/or diagnosis of SARS-CoV-2 by FDA under an Emergency Use Authorization (EUA).  This EUA will remain in effect (meaning this test can be used) for the duration of the COVID-19 declaration under Section 564(b)(1) of the Act, 21 U.S.C. section 360bbb-3(b)(1), unless the authorization is terminated or revoked sooner. Performed at [facility], [address], [place], [place] [address]   MRSA PCR Screening     Status: None   Collection Time: 05/01/19 12:42 PM  Result Value Ref Range Status   MRSA by PCR NEGATIVE NEGATIVE Final    Comment:        The GeneXpert MRSA Assay (FDA approved for NASAL specimens only), is one component of a comprehensive MRSA colonization surveillance program. It is not intended to diagnose MRSA infection nor to guide or monitor treatment for MRSA infections. Performed at [facility], [address] [place], [place] [address]      Labs: BNP (last 3 results) Recent Labs    06/16/18 0151 09/16/18 1049 04/30/19 1340  BNP 315.0* 407.0* 245.0*   Basic Metabolic Panel: Recent Labs  Lab 04/30/19 1340 05/01/19 0616 05/02/19 0624  NA 133* 129* 127*  K 4.3 4.5 5.5*  CL 93* 90* 90*  CO2 28 27 26   GLUCOSE 163* 206* 142*  BUN 7 12 15   CREATININE 0.69 0.57* 0.55*  CALCIUM 8.8* 8.5* 9.0  MG  --   --  1.9   Liver Function Tests: Recent Labs  Lab 04/30/19 1340  AST 25  ALT 24  ALKPHOS 165*  BILITOT 0.4   PROT 6.5  ALBUMIN 3.0*   No results for input(s): LIPASE, AMYLASE in the last  168 hours. No results for input(s): AMMONIA in the last 168 hours. CBC: Recent Labs  Lab 04/30/19 1340 05/01/19 0616  WBC 7.9 9.8  NEUTROABS 6.4  --   HGB 10.2* 9.9*  HCT 33.1* 31.8*  MCV 83.2 82.2  PLT 365 359   Cardiac Enzymes: Recent Labs  Lab 04/30/19 1340 04/30/19 1631 05/01/19 0019  TROPONINI <0.03 <0.03 <0.03   BNP: Invalid input(s): POCBNP CBG: No results for input(s): GLUCAP in the last 168 hours. D-Dimer No results for input(s): DDIMER in the last 72 hours. Hgb A1c No results for input(s): HGBA1C in the last 72 hours. Lipid Profile No results for input(s): CHOL, HDL, LDLCALC, TRIG, CHOLHDL, LDLDIRECT in the last 72 hours. Thyroid function studies Recent Labs    05/01/19 1142  TSH 1.552   Anemia work up No results for input(s): VITAMINB12, FOLATE, FERRITIN, TIBC, IRON, RETICCTPCT in the last 72 hours. Urinalysis    Component Value Date/Time   COLORURINE YELLOW 03/24/2019 1215   APPEARANCEUR CLEAR 03/24/2019 1215   LABSPEC 1.008 03/24/2019 1215   PHURINE 7.0 03/24/2019 1215   GLUCOSEU NEGATIVE 03/24/2019 1215   HGBUR NEGATIVE 03/24/2019 1215   BILIRUBINUR NEGATIVE 03/24/2019 1215   KETONESUR NEGATIVE 03/24/2019 1215   PROTEINUR NEGATIVE 03/24/2019 1215   UROBILINOGEN 0.2 03/01/2011 1427   NITRITE NEGATIVE 03/24/2019 1215   LEUKOCYTESUR NEGATIVE 03/24/2019 1215   Sepsis Labs Invalid input(s): PROCALCITONIN,  WBC,  LACTICIDVEN Microbiology Recent Results (from the past 240 hour(s))  SARS Coronavirus 2 (CEPHEID - Performed in [facility] hospital lab), Hosp Order     Status: None   Collection Time: 04/30/19  3:41 PM  Result Value Ref Range Status   SARS Coronavirus 2 NEGATIVE NEGATIVE Final    Comment: (NOTE) If result is NEGATIVE SARS-CoV-2 target nucleic acids are NOT DETECTED. The SARS-CoV-2 RNA is generally detectable in upper and lower  respiratory specimens  during the acute phase of infection. The lowest  concentration of SARS-CoV-2 viral copies this assay can detect is 250  copies / mL. A negative result does not preclude SARS-CoV-2 infection  and should not be used as the sole basis for treatment or other  patient management decisions.  A negative result may occur with  improper specimen collection / handling, submission of specimen other  than nasopharyngeal swab, presence of viral mutation(s) within the  areas targeted by this assay, and inadequate number of viral copies  (<250 copies / mL). A negative result must be combined with clinical  observations, patient history, and epidemiological information. If result is POSITIVE SARS-CoV-2 target nucleic acids are DETECTED. The SARS-CoV-2 RNA is generally detectable in upper and lower  respiratory specimens dur ing the acute phase of infection.  Positive  results are indicative of active infection with SARS-CoV-2.  Clinical  correlation with patient history and other diagnostic information is  necessary to determine patient infection status.  Positive results do  not rule out bacterial infection or co-infection with other viruses. If result is PRESUMPTIVE POSTIVE SARS-CoV-2 nucleic acids MAY BE PRESENT.   A presumptive positive result was obtained on the submitted specimen  and confirmed on repeat testing.  While 2019 novel coronavirus  (SARS-CoV-2) nucleic acids may be present in the submitted sample  additional confirmatory testing may be necessary for epidemiological  and / or clinical management purposes  to differentiate between  SARS-CoV-2 and other Sarbecovirus currently known to infect humans.  If clinically indicated additional testing with an alternate test  methodology [phone]) is advised.  The SARS-CoV-2 RNA is generally  detectable in upper and lower respiratory sp ecimens during the acute  phase of infection. The expected result is Negative. Fact Sheet for Patients:   BoilerBrush.com.cy Fact Sheet for Healthcare Providers: https://pope.com/ This test is not yet approved or cleared by the [place] FDA and has been authorized for detection and/or diagnosis of SARS-CoV-2 by FDA under an Emergency Use Authorization (EUA).  This EUA will remain in effect (meaning this test can be used) for the duration of the COVID-19 declaration under Section 564(b)(1) of the Act, 21 U.S.C. section 360bbb-3(b)(1), unless the authorization is terminated or revoked sooner. Performed at [facility], [address] [place], [place] [address]   MRSA PCR Screening     Status: None   Collection Time: 05/01/19 12:42 PM  Result Value Ref Range Status   MRSA by PCR NEGATIVE NEGATIVE Final    Comment:        The GeneXpert MRSA Assay (FDA approved for NASAL specimens only), is one component of a comprehensive MRSA colonization surveillance program. It is not intended to diagnose MRSA infection nor to guide or monitor treatment for MRSA infections. Performed at [facility], [address], [place], [place] [address]      Time coordinating discharge: 35 minutes  SIGNED:   Erick Blinks, DO [facility] 05/02/2019, 8:42 AM  If 7PM-7AM, please contact night-coverage [email or web address] Password [email or web address]

## 2019-05-16 ENCOUNTER — Encounter (HOSPITAL_COMMUNITY): Payer: Self-pay | Admitting: Psychiatry

## 2019-05-16 ENCOUNTER — Ambulatory Visit (INDEPENDENT_AMBULATORY_CARE_PROVIDER_SITE_OTHER): Payer: Medicare Other | Admitting: Psychiatry

## 2019-05-16 ENCOUNTER — Other Ambulatory Visit: Payer: Self-pay

## 2019-05-16 DIAGNOSIS — F3162 Bipolar disorder, current episode mixed, moderate: Secondary | ICD-10-CM

## 2019-05-16 MED ORDER — FLUOXETINE HCL 40 MG PO CAPS: 40.0000 mg | ORAL_CAPSULE | Freq: Every day | ORAL | 2 refills | Status: DC

## 2019-05-16 MED ORDER — OLANZAPINE 10 MG PO TABS: 15.0000 mg | ORAL_TABLET | Freq: Every day | ORAL | 2 refills | Status: DC

## 2019-05-16 MED ORDER — DIVALPROEX SODIUM ER 500 MG PO TB24: 1500.0000 mg | ORAL_TABLET | Freq: Every day | ORAL | 2 refills | Status: DC

## 2019-05-16 MED ORDER — GABAPENTIN 400 MG PO CAPS: 400.0000 mg | ORAL_CAPSULE | Freq: Three times a day (TID) | ORAL | 2 refills | Status: DC

## 2019-05-16 NOTE — Progress Notes (Signed)
Virtual Visit via Video Note  I connected with Jeffrey [name] on 05/16/19 at  3:20 PM EDT by a video enabled telemedicine application and verified that I am speaking with the correct person using two identifiers.   I discussed the limitations of evaluation and management by telemedicine and the availability of in person appointments. The patient expressed understanding and agreed to proceed.    I discussed the assessment and treatment plan with the patient. The patient was provided an opportunity to ask questions and all were answered. The patient agreed with the plan and demonstrated an understanding of the instructions.   The patient was advised to call back or seek an in-person evaluation if the symptoms worsen or if the condition fails to improve as anticipated.  I provided 15 minutes of non-face-to-face time during this encounter.   Diannia Ruder, MD  [facility] MD/PA/NP OP Progress Note  05/16/2019 3:59 PM [name]  MRN:  [id number]  Chief Complaint:  Chief Complaint    Depression; Anxiety; Manic Behavior; Follow-up     HPI: This patient is a 58 year old separated white male who is livingalone on his father's land near [place]. 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  The patient had been going to [facility] for the last couple of years but they no longer take his insurance and he was therefore referred here. His daughter states that he is not doing well in terms of mood and needs to be seen anyway.  The patient has a long history of depression that dates back to his early 30s. At that time he was married to his first wife and for a while it went well. However when they separated she did not allow him to see his son and this went on for a period of about 8 years. He became increasingly stressed and depressed during this time. He was hospitalized several times for suicide attempts and was in the [place] hospital twice in 2009. His daughter thinks  his last hospital they shows approximately 5 years ago.  The patient married again and he has been married to his second wife for 17 years. She has 3 adult children and one of her sons is very violent. He is finally decided to leave his wife because her son has fought with him several times and he feels like they're trying to take away his land. Over the last month he has been staying with his daughter but is very stressed about the whole situation. He's not able to eat and has lost 40 pounds over last year. His mood is very low and sad. His memory is poor. He smokes marijuana intermittently to try to deal with the stress. He has no interest in anything other than his children and grandchildren. His energy is nonexistent and he cannot get himself out of bed. He sees [name] for primary care and was there recently and apparently there was nothing medically wrong but we do not have these records. He has had a recent colonoscopy that was negative. He denies auditory or visual hallucinations and does not use alcohol or drugs other than marijuana  He has been on a combination of Depakote Paxil Navane and BuSpar for number of years. Nothing has been change in the last couple of years despite his continued downward slide  The patient returns for follow-up after long absence.  He was last seen about 6 months ago.  He is assessed via telemedicine today due to  the coronavirus pandemic.  The patient [facility] after he was found unresponsive.  He did have pneumonia but also had been abusing alcohol cocaine and marijuana.  It seems as if he went through withdrawal seizures while in the hospital although he was under intubation at the time.  He was encephalopathic and his ammonia level was 247.  His Depakote level during that admission was 89.  He stayed in the hospital almost a month.  He went back in shortly thereafter for COPD exacerbation and has been in several more times the  last time being earlier this month for COPD exacerbation and congestive heart failure.  The patient states that he has stopped using any drugs or alcohol or cigarettes after his admission in January.  He is now on home oxygen.  He states that it is helping him.  He is still taking all of his psychiatric medications but trazodone did not help with sleep so he is using melatonin which works better.  He states that he is in a good mood he is happy to be alive and he is currently staying with his ex-wife till he can get back on his feet.  He denies any thoughts of self-harm. Visit Diagnosis:    ICD-10-CM   1. Moderate mixed bipolar I disorder (HCC)  F31.62     Past Psychiatric History: Several previous hospitalizations for depression, the last one being on 01/31/2018 for suicide attempt  Past Medical History:  Past Medical History:  Diagnosis Date  . Acute blood loss anemia 02/07/2017  . Anxiety   . Arthritis    deg disease, bulging disk,  shoulder level  . Bipolar disorder (HCC)   . Bipolar disorder (HCC)   . CHF (congestive heart failure) (HCC)   . 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 Depression    anxiety  . Hyperlipidemia   . Hypertension   . Peptic ulcer disease    Review  . Pneumonia   . [name] (peripheral vascular disease) (HCC) 06/18/2015  . Shortness of breath     Past Surgical History:  Procedure Laterality Date  . BIOPSY  02/09/2017   Procedure: BIOPSY;  Surgeon: West BaliSandi L Fields, MD;  Location: [facility];  Service: Endoscopy;;  duodenal gastric  . COLONOSCOPY  03/14/2007   WUJ:[id number]:Normal colonoscopy and terminal ileoscopy except external hemorrhoids  . COLONOSCOPY N/A 05/05/2013   Dr. Jena Gaussourk: external/internal anal canal hemorrhoids, unable to intubate TI, segemental biopsies unremarkable   . COLONOSCOPY N/A 04/11/2017   Procedure: COLONOSCOPY;  Surgeon: West BaliFields, Sandi L, MD;  Location: [facility];  Service: Endoscopy;  Laterality: N/A;  . COLONOSCOPY WITH PROPOFOL N/A 03/31/2017    Procedure: COLONOSCOPY WITH PROPOFOL;  Surgeon: Corbin Adeourk, Robert M, MD;  Location: [facility];  Service: Endoscopy;  Laterality: N/A;  1:45pm  . ESOPHAGOGASTRODUODENOSCOPY  03/14/2007   NWG:[id number]:Nonerosive antral gastritis with bulbar duodenitis/paucity to postbulbar duodenal folds and biopsy were benign with no evidence of villous atrophy.  . ESOPHAGOGASTRODUODENOSCOPY (EGD) WITH PROPOFOL N/A 02/09/2017   Procedure: ESOPHAGOGASTRODUODENOSCOPY (EGD) WITH PROPOFOL;  Surgeon: West BaliSandi L Fields, MD;  Location: [facility];  Service: Endoscopy;  Laterality: N/A;  . GIVENS CAPSULE STUDY N/A 04/08/2017   Procedure: GIVENS CAPSULE STUDY;  Surgeon: Corbin Adeourk, Robert M, MD;  Location: [facility];  Service: Endoscopy;  Laterality: N/A;  . HAND SURGERY     left, secondary to self-inflicted laceration  . HEMORRHOID SURGERY N/A 04/14/2017   Procedure: HEMORRHOIDECTOMY;  Surgeon:  Franky MachoJenkins, Mark, MD;  Location: [facility];  Service: General;  Laterality: N/A;  . SHOULDER SURGERY     right  . TOE SURGERY     left great toe , amputated-lawnmover accident    Family Psychiatric History: See below  Family History:  Family History  Problem Relation Age of Onset  . Breast cancer Mother        deceased  . Heart disease Father   . Depression Daughter   . Anxiety disorder Daughter   . Anxiety disorder Son   . Depression Son   . Asthma Brother   . Heart attack Maternal Aunt   . Heart attack Maternal Uncle   . Heart attack Paternal Aunt   . Heart attack Paternal Uncle   . Heart attack Maternal Grandmother   . Heart attack Maternal Grandfather   . Emphysema Maternal Grandfather   . Heart attack Paternal Grandmother   . Heart attack Paternal Grandfather   . Colon cancer Neg Hx   . Liver disease Neg Hx     Social History:  Social History   Socioeconomic History  . Marital status: Divorced    Spouse name: Not on file  . Number of children: 2  . Years of education: Not on file  . Highest education level: Not on  file  Occupational History  . Occupation: Disabled  Social Needs  . Financial resource strain: Not on file  . Food insecurity    Worry: Not on file    Inability: Not on file  . Transportation needs    Medical: Not on file    Non-medical: Not on file  Tobacco Use  . Smoking status: Former Smoker    Packs/day: 1.00    Years: 40.00    Pack years: 40.00    Types: Cigarettes    Start date: 08/04/1971  . Smokeless tobacco: Never Used  . Tobacco comment: peak rate of 2.5ppd, 1/2ppd on 11/27/2016 -- 6 cigarettes / day 01/25/18  Substance and Sexual Activity  . Alcohol use: No    Alcohol/week: 0.0 standard drinks  . Drug use: Not Currently    Types: Marijuana    Comment: most days   . Sexual activity: Never  Lifestyle  . Physical activity    Days per week: Not on file    Minutes per session: Not on file  . Stress: Not on file  Relationships  . Social Musicianconnections    Talks on phone: Not on file    Gets together: Not on file    Attends religious service: Not on file    Active member of club or organization: Not on file    Attends meetings of clubs or organizations: Not on file    Relationship status: Not on file  Other Topics Concern  . Not on file  Social History Narrative   Originally from [place]. Previously has lived [place] 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 Questionable asbestos exposure. Does have significant exposure to fumes. No mold exposure. No bird exposure. No pets currently.     Allergies:  Allergies  Allergen Reactions  . Augmentin [Amoxicillin-Pot Clavulanate] Rash and Other (See Comments)    Has patient had a PCN reaction causing immediate rash, facial/tongue/throat swelling, SOB or lightheadedness with hypotension: No Has patient had a PCN reaction causing severe rash involving mucus membranes or skin necrosis: No Has patient had a PCN reaction that required hospitalization No  Has patient  had a PCN reaction occurring within the last 10 years: Yes If all of the above answers are "NO", then may proceed with Cephalosporin use.  . Ace Inhibitors Hives    Metabolic Disorder Labs: No results found for: HGBA1C, MPG No results found for: PROLACTIN Lab Results  Component Value Date   TRIG 84 02/23/2019   Lab Results  Component Value Date   TSH 1.552 05/01/2019   TSH 0.787 12/22/2018    Therapeutic Level Labs: No results found for: LITHIUM Lab Results  Component Value Date   VALPROATE 83 01/04/2019   VALPROATE 27 (L) 12/31/2018   No components found for:  CBMZ  Current Medications: Current Outpatient Medications  Medication Sig Dispense Refill  . albuterol (PROAIR HFA) 108 (90 Base) MCG/ACT inhaler Inhale 2 puffs into the lungs every 4 (four) hours as needed for wheezing or shortness of breath. 1 Inhaler 3  . albuterol (PROVENTIL) (2.5 MG/3ML) 0.083% nebulizer solution Take 3 mLs (2.5 mg total) by nebulization every 4 (four) hours as needed for shortness of breath. 120 mL 1  . aspirin EC 81 MG tablet Take 81 mg by mouth every evening.     Marland Kitchen. atorvastatin (LIPITOR) 20 MG tablet Take 1 tablet (20 mg total) by mouth daily. For high cholesterol (Patient taking differently: Take 20 mg by mouth every evening. For high cholesterol) 30 tablet 0  . diltiazem (CARDIZEM CD) 180 MG 24 hr capsule Take 1 capsule (180 mg total) by mouth daily. 30 capsule 0  . divalproex (DEPAKOTE ER) 500 MG 24 hr tablet Take 3 tablets (1,500 mg total) by mouth at bedtime. 90 tablet 2  . feeding supplement, ENSURE ENLIVE, (ENSURE ENLIVE) LIQD Take 237 mLs by mouth 2 (two) times daily between meals. 237 mL 12  . FLUoxetine (PROZAC) 40 MG capsule Take 1 capsule (40 mg total) by mouth daily. For mood control 30 capsule 2  . furosemide (LASIX) 20 MG tablet Take 2 tablets (40 mg total) by mouth daily for 30 days. 60 tablet 0  . gabapentin (NEURONTIN) 400 MG capsule Take 1 capsule (400 mg total) by mouth 3  (three) times daily. 90 capsule 2  . ipratropium (ATROVENT) 0.02 % nebulizer solution Take 2.5 mLs (0.5 mg total) by nebulization 3 (three) times daily. 225 mL 1  . methocarbamol (ROBAXIN) 500 MG tablet Take 1 tablet by mouth 3 (three) times daily.    . metoprolol tartrate (LOPRESSOR) 25 MG tablet Take 1 tablet (25 mg total) by mouth 2 (two) times daily. (Patient taking differently: Take 50 mg by mouth every evening. ) 60 tablet 0  . Multiple Vitamin (MULTIVITAMIN WITH MINERALS) TABS tablet Take 1 tablet by mouth daily. 30 tablet 0  . OLANZapine (ZYPREXA) 10 MG tablet Take 1.5 tablets (15 mg total) by mouth at bedtime. For mood stability 45 tablet 2   No current facility-administered medications for this visit.      Musculoskeletal: Strength & Muscle Tone: within normal limits Gait & Station: normal Patient leans: N/A  Psychiatric Specialty Exam: Review of Systems  Constitutional: Positive for malaise/fatigue.  Respiratory: Positive for shortness of breath.     There were no vitals taken for this visit.There is no height or weight on file to calculate BMI.  General Appearance: Casual and Guarded  Eye Contact:  Fair  Speech:  Clear and Coherent  Volume:  Decreased  Mood:  Euthymic  Affect:  Flat  Thought Process:  Goal Directed  Orientation:  Full (Time,  Place, and Person)  Thought Content: WDL   Suicidal Thoughts:  No  Homicidal Thoughts:  No  Memory:  Immediate;   Good Recent;   Good Remote;   Fair  Judgement:  Poor  Insight:  Shallow  Psychomotor Activity:  Decreased  Concentration:  Concentration: Fair and Attention Span: Fair  Recall:  [employer] of Knowledge: Fair  Language: Good  Akathisia:  No  Handed:  Right  AIMS (if indicated): not done  Assets:  Communication Skills Desire for Improvement Resilience Social Support  ADL's:  Intact  Cognition: WNL  Sleep:  Good   Screenings: AIMS     Admission (Discharged) from 01/31/2018 in [place] [facility]  AIMS Total Score  0    AUDIT     Admission (Discharged) from 01/31/2018 in [place] [facility]  Alcohol Use Disorder Identification Test Final Score (AUDIT)  18       Assessment and Plan: This patient is a 58 year old male with a history of bipolar disorder and polysubstance abuse.  He seems to have learned a lesson regarding the substance abuse since his admission in January when he was unresponsive and required intubation and had toxic encephalopathy.  He is now on home oxygen and seems to be thinking more clearly.  He will continue Depakote ER 1500 mg at bedtime for mood stabilization, Prozac 40 mg daily for depression, gabapentin 400 mg 3 times daily for anxiety and olanzapine 15 mg at bedtime for mood stabilization.  He will return to see me in 3 months   Levonne Spiller, MD 05/16/2019, 3:59 PM

## 2019-08-17 ENCOUNTER — Encounter (HOSPITAL_COMMUNITY): Payer: Self-pay | Admitting: Psychiatry

## 2019-08-17 ENCOUNTER — Other Ambulatory Visit: Payer: Self-pay

## 2019-08-17 ENCOUNTER — Ambulatory Visit (INDEPENDENT_AMBULATORY_CARE_PROVIDER_SITE_OTHER): Payer: Medicare Other | Admitting: Psychiatry

## 2019-08-17 DIAGNOSIS — F3162 Bipolar disorder, current episode mixed, moderate: Secondary | ICD-10-CM

## 2019-08-17 MED ORDER — OLANZAPINE 10 MG PO TABS: 15.0000 mg | ORAL_TABLET | Freq: Every day | ORAL | 2 refills | Status: DC

## 2019-08-17 MED ORDER — FLUOXETINE HCL 40 MG PO CAPS: 40.0000 mg | ORAL_CAPSULE | Freq: Every day | ORAL | 2 refills | Status: DC

## 2019-08-17 MED ORDER — GABAPENTIN 400 MG PO CAPS: 400.0000 mg | ORAL_CAPSULE | Freq: Three times a day (TID) | ORAL | 2 refills | Status: DC

## 2019-08-17 MED ORDER — DIVALPROEX SODIUM ER 500 MG PO TB24: 1500.0000 mg | ORAL_TABLET | Freq: Every day | ORAL | 2 refills | Status: DC

## 2019-08-17 NOTE — Progress Notes (Signed)
Virtual Visit via Telephone Note  I connected with Jeffrey [name] on 08/17/19 at  9:00 AM EDT by telephone and verified that I am speaking with the correct person using two identifiers.   I discussed the limitations, risks, security and privacy concerns of performing an evaluation and management service by telephone and the availability of in person appointments. I also discussed with the patient that there may be a patient responsible charge related to this service. The patient expressed understanding and agreed to proceed.     I discussed the assessment and treatment plan with the patient. The patient was provided an opportunity to ask questions and all were answered. The patient agreed with the plan and demonstrated an understanding of the instructions.   The patient was advised to call back or seek an in-person evaluation if the symptoms worsen or if the condition fails to improve as anticipated.  I provided 15 minutes of non-face-to-face time during this encounter.   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  The patient had been going to [facility] for the last couple of years but they no longer take his insurance and he was therefore referred here. His daughter states that he is not doing well in terms of mood and needs to be seen anyway.  The patient has a long history of depression that dates back to his early 40s. At that time he was married to his first wife and for a while it went well. However when they separated she did not allow him to see his son and this went on for a period of about 8 years. He became increasingly stressed and depressed during  this time. He was hospitalized several times for suicide attempts and was in the  hospital twice in 2009. His daughter thinks his last hospital they shows approximately 5 years ago.  The patient married again and he has been married to his second wife for 17 years. She has 3 adult children and one of her sons is very violent. He is finally decided to leave his wife because her son has fought with him several times and he feels like they're trying to take away his land. Over the last month he has been staying with his daughter but is very stressed about the whole situation. He's not able to eat and has lost 40 pounds over last year. His mood is very low and sad. His memory is poor. He smokes marijuana intermittently to try to deal with the stress. He has no interest in anything other than his children and grandchildren. His energy is nonexistent and he cannot get himself out of bed. He sees [name] for primary care and was there recently and apparently there was nothing medically wrong but we do not have these records. He has had a recent colonoscopy that was negative. He denies auditory or visual hallucinations and does not use alcohol or drugs other than marijuana  He has been on a combination of Depakote Paxil Navane and BuSpar for number of years. Nothing has been change in the last couple of years despite his continued downward slide  The patient  returns for follow-up after 3 months.  Prior to his last visit he had been hospitalized numerous times for severe COPD.  He is also been found to be abusing alcohol cocaine and marijuana.  Since his last hospitalization in early June he has been back living with his ex-wife and this seems to be good for him.  He is no longer abusing any drugs or alcohol.  He is using his oxygen every day.  He does not have a lot of energy due to COPD and heart failure so he spending most of his time watching TV.  He and his ex wife and her family are all  getting along now.  He denies being depressed or manic.  He was not sleeping well so his primary doctor put him on Ambien 5 mg at bedtime which seems to help.  He denies severe depression or or anxiety and has no thoughts of self-harm or suicide.  He thinks being back with his wife is helped him considerably Visit Diagnosis:    ICD-10-CM   1. Moderate mixed bipolar I disorder (HCC)  F31.62     Past Psychiatric History: Several hospitalizations for depression, the last one being in March 2019 for suicide attempt  Past Medical History:  Past Medical History:  Diagnosis Date  . Acute blood loss anemia 02/07/2017  . Anxiety   . Arthritis    deg disease, bulging disk,  shoulder level  . Bipolar disorder (HCC)   . Bipolar disorder (HCC)   . CHF (congestive heart failure) (HCC)   . 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 Depression    anxiety  . Hyperlipidemia   . Hypertension   . Peptic ulcer disease    Review  . Pneumonia   . [name] (peripheral vascular disease) (HCC) 06/18/2015  . Shortness of breath     Past Surgical History:  Procedure Laterality Date  . BIOPSY  02/09/2017   Procedure: BIOPSY;  Surgeon: [name], MD;  Location: [facility];  Service: Endoscopy;;  duodenal gastric  . COLONOSCOPY  03/14/2007   [id number] colonoscopy and terminal ileoscopy except external hemorrhoids  . COLONOSCOPY N/A 05/05/2013   [name]: external/internal anal canal hemorrhoids, unable to intubate TI, segemental biopsies unremarkable   . COLONOSCOPY N/A 04/11/2017   Procedure: COLONOSCOPY;  Surgeon: [name], MD;  Location: [facility];  Service: Endoscopy;  Laterality: N/A;  . COLONOSCOPY WITH PROPOFOL N/A 03/31/2017   Procedure: COLONOSCOPY WITH PROPOFOL;  Surgeon: [name], MD;  Location: [facility];  Service: Endoscopy;  Laterality: N/A;  1:45pm  . ESOPHAGOGASTRODUODENOSCOPY  03/14/2007   ONG:[id number] antral gastritis with bulbar duodenitis/paucity to postbulbar duodenal  folds and biopsy were benign with no evidence of villous atrophy.  . ESOPHAGOGASTRODUODENOSCOPY (EGD) WITH PROPOFOL N/A 02/09/2017   Procedure: ESOPHAGOGASTRODUODENOSCOPY (EGD) WITH PROPOFOL;  Surgeon: [name], MD;  [facility]: [facility];  Service: Endoscopy;  Laterality: N/A;  . GIVENS CAPSULE STUDY N/A 04/08/2017   Procedure: GIVENS CAPSULE STUDY;  Surgeon: [name], MD;  Location: [facility];  Service: Endoscopy;  Laterality: N/A;  . HAND SURGERY     left, secondary to self-inflicted laceration  . HEMORRHOID SURGERY N/A 04/14/2017   Procedure: HEMORRHOIDECTOMY;  Surgeon: [name], MD;  Location: [facility];  Service: General;  Laterality: N/A;  . SHOULDER SURGERY     right  . TOE SURGERY     left great toe , amputated-lawnmover accident    Family Psychiatric History:  See below  Family History:  Family History  Problem Relation Age of Onset  . Breast cancer Mother        deceased  . Heart disease Father   . Depression Daughter   . Anxiety disorder Daughter   . Anxiety disorder Son   . Depression Son   . Asthma Brother   . Heart attack Maternal Aunt   . Heart attack Maternal Uncle   . Heart attack Paternal Aunt   . Heart attack Paternal Uncle   . Heart attack Maternal Grandmother   . Heart attack Maternal Grandfather   . Emphysema Maternal Grandfather   . Heart attack Paternal Grandmother   . Heart attack Paternal Grandfather   . Colon cancer Neg Hx   . Liver disease Neg Hx     Social History:  Social History   Socioeconomic History  . Marital status: Divorced    Spouse name: Not on file  . Number of children: 2  . Years of education: Not on file  . Highest education level: Not on file  Occupational History  . Occupation: Disabled  Social Needs  . Financial resource strain: Not on file  . Food insecurity    Worry: Not on file    Inability: Not on file  . Transportation needs    Medical: Not on file    Non-medical: Not on file  Tobacco Use   . Smoking status: Former Smoker    Packs/day: 1.00    Years: 40.00    Pack years: 40.00    Types: Cigarettes    Start date: 08/04/1971  . Smokeless tobacco: Never Used  . Tobacco comment: peak rate of 2.5ppd, 1/2ppd on 11/27/2016 -- 6 cigarettes / day 01/25/18  Substance and Sexual Activity  . Alcohol use: No    Alcohol/week: 0.0 standard drinks  . Drug use: Not Currently    Types: Marijuana    Comment: most days   . Sexual activity: Never  Lifestyle  . Physical activity    Days per week: Not on file    Minutes per session: Not on file  . Stress: Not on file  Relationships  . Social Herbalist on phone: Not on file    Gets together: Not on file    Attends religious service: Not on file    Active member of club or organization: Not on file    Attends meetings of clubs or organizations: Not on file    Relationship status: Not on file  Other Topics Concern  . Not on file  Social History Narrative   Originally from [place]. Previously has lived in [place]. 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 Questionable asbestos exposure. Does have significant exposure to fumes. No mold exposure. No bird exposure. No pets currently.     Allergies:  Allergies  Allergen Reactions  . Augmentin [Amoxicillin-Pot Clavulanate] Rash and Other (See Comments)    Has patient had a PCN reaction causing immediate rash, facial/tongue/throat swelling, SOB or lightheadedness with hypotension: No Has patient had a PCN reaction causing severe rash involving mucus membranes or skin necrosis: No Has patient had a PCN reaction that required hospitalization No Has patient had a PCN reaction occurring within the last 10 years: Yes If all of the above answers are "NO", then may proceed with Cephalosporin use.  . Ace Inhibitors Hives    Metabolic Disorder Labs: No results found for:  HGBA1C, MPG No results found for: PROLACTIN Lab Results   Component Value Date   TRIG 84 02/23/2019   Lab Results  Component Value Date   TSH 1.552 05/01/2019   TSH 0.787 12/22/2018    Therapeutic Level Labs: No results found for: LITHIUM Lab Results  Component Value Date   VALPROATE 83 01/04/2019   VALPROATE 27 (L) 12/31/2018   No components found for:  CBMZ  Current Medications: Current Outpatient Medications  Medication Sig Dispense Refill  . albuterol (PROAIR HFA) 108 (90 Base) MCG/ACT inhaler Inhale 2 puffs into the lungs every 4 (four) hours as needed for wheezing or shortness of breath. 1 Inhaler 3  . albuterol (PROVENTIL) (2.5 MG/3ML) 0.083% nebulizer solution Take 3 mLs (2.5 mg total) by nebulization every 4 (four) hours as needed for shortness of breath. 120 mL 1  . aspirin EC 81 MG tablet Take 81 mg by mouth every evening.     Marland Kitchen. atorvastatin (LIPITOR) 20 MG tablet Take 1 tablet (20 mg total) by mouth daily. For high cholesterol (Patient taking differently: Take 20 mg by mouth every evening. For high cholesterol) 30 tablet 0  . diltiazem (CARDIZEM CD) 180 MG 24 hr capsule Take 1 capsule (180 mg total) by mouth daily. 30 capsule 0  . divalproex (DEPAKOTE ER) 500 MG 24 hr tablet Take 3 tablets (1,500 mg total) by mouth at bedtime. 90 tablet 2  . feeding supplement, ENSURE ENLIVE, (ENSURE ENLIVE) LIQD Take 237 mLs by mouth 2 (two) times daily between meals. 237 mL 12  . FLUoxetine (PROZAC) 40 MG capsule Take 1 capsule (40 mg total) by mouth daily. For mood control 30 capsule 2  . furosemide (LASIX) 20 MG tablet Take 2 tablets (40 mg total) by mouth daily for 30 days. 60 tablet 0  . gabapentin (NEURONTIN) 400 MG capsule Take 1 capsule (400 mg total) by mouth 3 (three) times daily. 90 capsule 2  . ipratropium (ATROVENT) 0.02 % nebulizer solution Take 2.5 mLs (0.5 mg total) by nebulization 3 (three) times daily. 225 mL 1  . methocarbamol (ROBAXIN) 500 MG tablet Take 1 tablet by mouth 3 (three) times daily.    . metoprolol tartrate  (LOPRESSOR) 25 MG tablet Take 1 tablet (25 mg total) by mouth 2 (two) times daily. (Patient taking differently: Take 50 mg by mouth every evening. ) 60 tablet 0  . Multiple Vitamin (MULTIVITAMIN WITH MINERALS) TABS tablet Take 1 tablet by mouth daily. 30 tablet 0  . OLANZapine (ZYPREXA) 10 MG tablet Take 1.5 tablets (15 mg total) by mouth at bedtime. For mood stability 45 tablet 2  . zolpidem (AMBIEN) 5 MG tablet TAKE 1 TABLET BY MOUTH AT BEDTIME FOR SLEEP     No current facility-administered medications for this visit.      Musculoskeletal: Strength & Muscle Tone: decreased Gait & Station: normal Patient leans: N/A  Psychiatric Specialty Exam: Review of Systems  Constitutional: Positive for malaise/fatigue.  Respiratory: Positive for shortness of breath.   All other systems reviewed and are negative.   There were no vitals taken for this visit.There is no height or weight on file to calculate BMI.  General Appearance: NA  Eye Contact:  NA  Speech:  Clear and Coherent  Volume:  Normal  Mood:  Euthymic  Affect:  NA  Thought Process:  Goal Directed  Orientation:  Full (Time, Place, and Person)  Thought Content: WDL   Suicidal Thoughts:  No  Homicidal Thoughts:  No  Memory:  Immediate;   Good Recent;   Good Remote;   Fair  Judgement:  Fair  Insight:  Shallow  Psychomotor Activity:  Decreased  Concentration:  Concentration: Fair and Attention Span: Fair  Recall:  [employer] of Knowledge: Fair  Language: Good  Akathisia:  No  Handed:  Right  AIMS (if indicated): not done  Assets:  Communication Skills Desire for Improvement Resilience Social Support Talents/Skills  ADL's:  Intact  Cognition: WNL  Sleep:  Good   Screenings: AIMS     [facility]  AIMS Total Score  0    AUDIT     [facility]  Alcohol Use Disorder  Identification Test Final Score (AUDIT)  18       Assessment and Plan: This patient is a 58 year old male with a history of bipolar disorder and polysubstance abuse.  He is doing better now that he lives with his wife and is no longer using drugs or alcohol and is medically compliant.  His cognition has improved considerably.  He will continue Depakote ER 1500 mg at bedtime for mood stabilization, Prozac 40 mg daily for depression, gabapentin 400 mg 3 times daily for anxiety and olanzapine 15 mg at bedtime also for mood stabilization.  He is taking Ambien 5 mg from his primary doctor.  He will return to see me in 3 months   Diannia Ruder, MD 08/17/2019, 9:11 AM

## 2019-10-12 ENCOUNTER — Emergency Department (HOSPITAL_COMMUNITY): Payer: Medicare Other

## 2019-10-12 ENCOUNTER — Inpatient Hospital Stay (HOSPITAL_COMMUNITY)
Admission: EM | Admit: 2019-10-12 | Discharge: 2019-10-16 | DRG: 291 | Disposition: A | Discharge status: Home / Self Care | Payer: Medicare Other | Attending: Internal Medicine | Admitting: Internal Medicine

## 2019-10-12 ENCOUNTER — Inpatient Hospital Stay (HOSPITAL_COMMUNITY): Payer: Medicare Other

## 2019-10-12 ENCOUNTER — Other Ambulatory Visit: Payer: Self-pay

## 2019-10-12 ENCOUNTER — Encounter (HOSPITAL_COMMUNITY): Payer: Self-pay

## 2019-10-12 DIAGNOSIS — E7800 Pure hypercholesterolemia, unspecified: Secondary | ICD-10-CM | POA: Diagnosis present

## 2019-10-12 DIAGNOSIS — Z818 Family history of other mental and behavioral disorders: Secondary | ICD-10-CM

## 2019-10-12 DIAGNOSIS — R0602 Shortness of breath: Secondary | ICD-10-CM

## 2019-10-12 DIAGNOSIS — J441 Chronic obstructive pulmonary disease with (acute) exacerbation: Secondary | ICD-10-CM | POA: Diagnosis present

## 2019-10-12 DIAGNOSIS — Z825 Family history of asthma and other chronic lower respiratory diseases: Secondary | ICD-10-CM | POA: Diagnosis not present

## 2019-10-12 DIAGNOSIS — J9602 Acute respiratory failure with hypercapnia: Secondary | ICD-10-CM | POA: Diagnosis present

## 2019-10-12 DIAGNOSIS — E876 Hypokalemia: Secondary | ICD-10-CM | POA: Diagnosis not present

## 2019-10-12 DIAGNOSIS — E785 Hyperlipidemia, unspecified: Secondary | ICD-10-CM | POA: Diagnosis present

## 2019-10-12 DIAGNOSIS — I340 Nonrheumatic mitral (valve) insufficiency: Secondary | ICD-10-CM | POA: Diagnosis not present

## 2019-10-12 DIAGNOSIS — Z79899 Other long term (current) drug therapy: Secondary | ICD-10-CM

## 2019-10-12 DIAGNOSIS — R0902 Hypoxemia: Secondary | ICD-10-CM

## 2019-10-12 DIAGNOSIS — Z87891 Personal history of nicotine dependence: Secondary | ICD-10-CM

## 2019-10-12 DIAGNOSIS — I5033 Acute on chronic diastolic (congestive) heart failure: Secondary | ICD-10-CM | POA: Diagnosis present

## 2019-10-12 DIAGNOSIS — Z888 Allergy status to other drugs, medicaments and biological substances status: Secondary | ICD-10-CM

## 2019-10-12 DIAGNOSIS — J9601 Acute respiratory failure with hypoxia: Secondary | ICD-10-CM | POA: Diagnosis present

## 2019-10-12 DIAGNOSIS — R0600 Dyspnea, unspecified: Secondary | ICD-10-CM

## 2019-10-12 DIAGNOSIS — Z8249 Family history of ischemic heart disease and other diseases of the circulatory system: Secondary | ICD-10-CM | POA: Diagnosis not present

## 2019-10-12 DIAGNOSIS — F419 Anxiety disorder, unspecified: Secondary | ICD-10-CM | POA: Diagnosis present

## 2019-10-12 DIAGNOSIS — E871 Hypo-osmolality and hyponatremia: Secondary | ICD-10-CM | POA: Diagnosis present

## 2019-10-12 DIAGNOSIS — F319 Bipolar disorder, unspecified: Secondary | ICD-10-CM | POA: Diagnosis present

## 2019-10-12 DIAGNOSIS — R001 Bradycardia, unspecified: Secondary | ICD-10-CM | POA: Diagnosis not present

## 2019-10-12 DIAGNOSIS — Z803 Family history of malignant neoplasm of breast: Secondary | ICD-10-CM | POA: Diagnosis not present

## 2019-10-12 DIAGNOSIS — I739 Peripheral vascular disease, unspecified: Secondary | ICD-10-CM | POA: Diagnosis present

## 2019-10-12 DIAGNOSIS — R0689 Other abnormalities of breathing: Secondary | ICD-10-CM

## 2019-10-12 DIAGNOSIS — G92 Toxic encephalopathy: Secondary | ICD-10-CM | POA: Diagnosis present

## 2019-10-12 DIAGNOSIS — Z880 Allergy status to penicillin: Secondary | ICD-10-CM

## 2019-10-12 DIAGNOSIS — Z8711 Personal history of peptic ulcer disease: Secondary | ICD-10-CM

## 2019-10-12 DIAGNOSIS — K219 Gastro-esophageal reflux disease without esophagitis: Secondary | ICD-10-CM | POA: Diagnosis present

## 2019-10-12 DIAGNOSIS — I110 Hypertensive heart disease with heart failure: Secondary | ICD-10-CM | POA: Diagnosis present

## 2019-10-12 DIAGNOSIS — Z7982 Long term (current) use of aspirin: Secondary | ICD-10-CM

## 2019-10-12 DIAGNOSIS — Z20828 Contact with and (suspected) exposure to other viral communicable diseases: Secondary | ICD-10-CM | POA: Diagnosis present

## 2019-10-12 LAB — CBC WITH DIFFERENTIAL/PLATELET
Abs Immature Granulocytes: 0.02 10*3/uL (ref 0.00–0.07)
Basophils Absolute: 0 10*3/uL (ref 0.0–0.1)
Basophils Relative: 0 %
Eosinophils Absolute: 0 10*3/uL (ref 0.0–0.5)
Eosinophils Relative: 0 %
HCT: 34.6 % — ABNORMAL LOW (ref 39.0–52.0)
Hemoglobin: 11 g/dL — ABNORMAL LOW (ref 13.0–17.0)
Immature Granulocytes: 0 %
Lymphocytes Relative: 10 %
Lymphs Abs: 0.7 10*3/uL (ref 0.7–4.0)
MCH: 27.8 pg (ref 26.0–34.0)
MCHC: 31.8 g/dL (ref 30.0–36.0)
MCV: 87.6 fL (ref 80.0–100.0)
Monocytes Absolute: 0.5 10*3/uL (ref 0.1–1.0)
Monocytes Relative: 7 %
Neutro Abs: 5.5 10*3/uL (ref 1.7–7.7)
Neutrophils Relative %: 83 %
Platelets: 189 10*3/uL (ref 150–400)
RBC: 3.95 MIL/uL — ABNORMAL LOW (ref 4.22–5.81)
RDW: 12.6 % (ref 11.5–15.5)
WBC: 6.8 10*3/uL (ref 4.0–10.5)
nRBC: 0 % (ref 0.0–0.2)

## 2019-10-12 LAB — URINALYSIS, ROUTINE W REFLEX MICROSCOPIC
Bilirubin Urine: NEGATIVE
Glucose, UA: NEGATIVE mg/dL
Hgb urine dipstick: NEGATIVE
Ketones, ur: NEGATIVE mg/dL
Leukocytes,Ua: NEGATIVE
Nitrite: NEGATIVE
Protein, ur: NEGATIVE mg/dL
Specific Gravity, Urine: 1.003 — ABNORMAL LOW (ref 1.005–1.030)
pH: 7 (ref 5.0–8.0)

## 2019-10-12 LAB — BASIC METABOLIC PANEL
Anion gap: 11 (ref 5–15)
BUN: 5 mg/dL — ABNORMAL LOW (ref 6–20)
CO2: 45 mmol/L — ABNORMAL HIGH (ref 22–32)
Calcium: 9.1 mg/dL (ref 8.9–10.3)
Chloride: 75 mmol/L — ABNORMAL LOW (ref 98–111)
Creatinine, Ser: 0.52 mg/dL — ABNORMAL LOW (ref 0.61–1.24)
GFR calc Af Amer: >60 mL/min (ref >60)
GFR calc non Af Amer: >60 mL/min (ref >60)
Glucose, Bld: 105 mg/dL — ABNORMAL HIGH (ref 70–99)
Potassium: 4.1 mmol/L (ref 3.5–5.1)
Sodium: 131 mmol/L — ABNORMAL LOW (ref 135–145)

## 2019-10-12 LAB — BLOOD GAS, VENOUS
Acid-Base Excess: 16.3 mmol/L — ABNORMAL HIGH (ref 0.0–2.0)
Bicarbonate: 37.2 mmol/L — ABNORMAL HIGH (ref 20.0–28.0)
FIO2: 96
O2 Saturation: 79.4 %
Patient temperature: 37.1
pCO2, Ven: 58.2 mmHg (ref 44.0–60.0)
pH, Ven: 7.465 — ABNORMAL HIGH (ref 7.250–7.430)
pO2, Ven: 44.1 mmHg (ref 32.0–45.0)

## 2019-10-12 LAB — BLOOD GAS, ARTERIAL
Acid-Base Excess: 19.4 mmol/L — ABNORMAL HIGH (ref 0.0–2.0)
Bicarbonate: 41.6 mmol/L — ABNORMAL HIGH (ref 20.0–28.0)
FIO2: 36
O2 Saturation: 95.3 %
Patient temperature: 38
pCO2 arterial: 72.7 mmHg (ref 32.0–48.0)
pH, Arterial: 7.411 (ref 7.350–7.450)
pO2, Arterial: 73.4 mmHg — ABNORMAL LOW (ref 83.0–108.0)

## 2019-10-12 LAB — BRAIN NATRIURETIC PEPTIDE: B Natriuretic Peptide: 306 pg/mL — ABNORMAL HIGH (ref 0.0–100.0)

## 2019-10-12 LAB — TROPONIN I (HIGH SENSITIVITY): Troponin I (High Sensitivity): 3 ng/L (ref <18)

## 2019-10-12 LAB — POC SARS CORONAVIRUS 2 AG
SARS Coronavirus 2 Ag: NEGATIVE
SARS Coronavirus 2 Ag: NEGATIVE

## 2019-10-12 LAB — SARS CORONAVIRUS 2 BY RT PCR (HOSPITAL ORDER, PERFORMED IN ~~LOC~~ HOSPITAL LAB): SARS Coronavirus 2: NEGATIVE

## 2019-10-12 LAB — ECHOCARDIOGRAM COMPLETE
Height: 70 in
Weight: 2560 oz

## 2019-10-12 MED ORDER — CHLORHEXIDINE GLUCONATE CLOTH 2 % EX PADS
6.0000 | MEDICATED_PAD | Freq: Every day | CUTANEOUS | Status: DC
  Administered 2019-10-13 – 2019-10-16 (×3): 6 via TOPICAL

## 2019-10-12 MED ORDER — GUAIFENESIN ER 600 MG PO TB12
600.0000 mg | ORAL_TABLET | Freq: Two times a day (BID) | ORAL | Status: DC
  Administered 2019-10-12 – 2019-10-16 (×7): 600 mg via ORAL
  Filled 2019-10-12 (×7): qty 1

## 2019-10-12 MED ORDER — DEXTROMETHORPHAN POLISTIREX ER 30 MG/5ML PO SUER: 30.0000 mg | Freq: Two times a day (BID) | ORAL | Status: DC | PRN

## 2019-10-12 MED ORDER — ENOXAPARIN SODIUM 40 MG/0.4ML ~~LOC~~ SOLN
40.0000 mg | SUBCUTANEOUS | Status: DC
  Administered 2019-10-12 – 2019-10-15 (×4): 40 mg via SUBCUTANEOUS
  Filled 2019-10-12 (×4): qty 0.4

## 2019-10-12 MED ORDER — FUROSEMIDE 10 MG/ML IJ SOLN
80.0000 mg | Freq: Once | INTRAMUSCULAR | Status: AC
  Administered 2019-10-12: 80 mg via INTRAVENOUS
  Filled 2019-10-12: qty 8

## 2019-10-12 MED ORDER — ACETAMINOPHEN 325 MG PO TABS: 650.0000 mg | ORAL_TABLET | Freq: Four times a day (QID) | ORAL | Status: DC | PRN

## 2019-10-12 MED ORDER — ALBUTEROL SULFATE HFA 108 (90 BASE) MCG/ACT IN AERS
6.0000 | INHALATION_SPRAY | Freq: Once | RESPIRATORY_TRACT | Status: AC
  Administered 2019-10-12: 6 via RESPIRATORY_TRACT
  Filled 2019-10-12: qty 6.7

## 2019-10-12 MED ORDER — ONDANSETRON HCL 4 MG/2ML IJ SOLN: 4.0000 mg | Freq: Four times a day (QID) | INTRAMUSCULAR | Status: DC | PRN

## 2019-10-12 MED ORDER — METHYLPREDNISOLONE SODIUM SUCC 40 MG IJ SOLR
40.0000 mg | Freq: Two times a day (BID) | INTRAMUSCULAR | Status: DC
  Administered 2019-10-12 – 2019-10-16 (×8): 40 mg via INTRAVENOUS
  Filled 2019-10-12 (×8): qty 1

## 2019-10-12 MED ORDER — DILTIAZEM HCL ER COATED BEADS 180 MG PO CP24
180.0000 mg | ORAL_CAPSULE | Freq: Every day | ORAL | Status: DC
  Administered 2019-10-13: 09:00:00 180 mg via ORAL
  Filled 2019-10-12: qty 1

## 2019-10-12 MED ORDER — FLUOXETINE HCL 20 MG PO CAPS
40.0000 mg | ORAL_CAPSULE | Freq: Every day | ORAL | Status: DC
  Administered 2019-10-12 – 2019-10-16 (×4): 40 mg via ORAL
  Filled 2019-10-12 (×4): qty 2

## 2019-10-12 MED ORDER — OLANZAPINE 5 MG PO TABS
15.0000 mg | ORAL_TABLET | Freq: Every day | ORAL | Status: DC
  Administered 2019-10-12 – 2019-10-15 (×3): 15 mg via ORAL
  Filled 2019-10-12 (×3): qty 3

## 2019-10-12 MED ORDER — HALOPERIDOL LACTATE 5 MG/ML IJ SOLN
2.0000 mg | Freq: Four times a day (QID) | INTRAMUSCULAR | Status: DC | PRN
  Administered 2019-10-12 – 2019-10-13 (×2): 2 mg via INTRAVENOUS
  Filled 2019-10-12 (×2): qty 1

## 2019-10-12 MED ORDER — IPRATROPIUM-ALBUTEROL 0.5-2.5 (3) MG/3ML IN SOLN
3.0000 mL | Freq: Once | RESPIRATORY_TRACT | Status: AC
  Administered 2019-10-12: 13:00:00 3 mL via RESPIRATORY_TRACT
  Filled 2019-10-12: qty 3

## 2019-10-12 MED ORDER — METHOCARBAMOL 500 MG PO TABS
500.0000 mg | ORAL_TABLET | Freq: Three times a day (TID) | ORAL | Status: DC
  Administered 2019-10-12 – 2019-10-16 (×9): 500 mg via ORAL
  Filled 2019-10-12 (×9): qty 1

## 2019-10-12 MED ORDER — DIVALPROEX SODIUM ER 500 MG PO TB24
1500.0000 mg | ORAL_TABLET | Freq: Every day | ORAL | Status: DC
  Administered 2019-10-12 – 2019-10-15 (×3): 1500 mg via ORAL
  Filled 2019-10-12 (×3): qty 3

## 2019-10-12 MED ORDER — ALPRAZOLAM 0.25 MG PO TABS
0.2500 mg | ORAL_TABLET | Freq: Three times a day (TID) | ORAL | Status: DC | PRN
  Administered 2019-10-12: 13:00:00 0.25 mg via ORAL
  Filled 2019-10-12: qty 1

## 2019-10-12 MED ORDER — TRAZODONE HCL 50 MG PO TABS
100.0000 mg | ORAL_TABLET | Freq: Every day | ORAL | Status: DC
  Administered 2019-10-12 – 2019-10-15 (×3): 100 mg via ORAL
  Filled 2019-10-12 (×3): qty 2

## 2019-10-12 MED ORDER — GABAPENTIN 400 MG PO CAPS
400.0000 mg | ORAL_CAPSULE | Freq: Three times a day (TID) | ORAL | Status: DC
  Administered 2019-10-12 – 2019-10-16 (×9): 400 mg via ORAL
  Filled 2019-10-12 (×9): qty 1

## 2019-10-12 MED ORDER — ASPIRIN EC 81 MG PO TBEC
81.0000 mg | DELAYED_RELEASE_TABLET | Freq: Every evening | ORAL | Status: DC
  Administered 2019-10-12 – 2019-10-15 (×3): 81 mg via ORAL
  Filled 2019-10-12 (×3): qty 1

## 2019-10-12 MED ORDER — ENSURE ENLIVE PO LIQD
237.0000 mL | Freq: Two times a day (BID) | ORAL | Status: DC
  Filled 2019-10-12 (×3): qty 237

## 2019-10-12 MED ORDER — LORAZEPAM 2 MG/ML IJ SOLN
1.0000 mg | INTRAMUSCULAR | Status: DC | PRN
  Administered 2019-10-12 – 2019-10-13 (×4): 1 mg via INTRAVENOUS
  Filled 2019-10-12 (×4): qty 1

## 2019-10-12 MED ORDER — IPRATROPIUM-ALBUTEROL 0.5-2.5 (3) MG/3ML IN SOLN
3.0000 mL | Freq: Four times a day (QID) | RESPIRATORY_TRACT | Status: DC
  Administered 2019-10-12 – 2019-10-16 (×16): 3 mL via RESPIRATORY_TRACT
  Filled 2019-10-12 (×16): qty 3

## 2019-10-12 MED ORDER — POTASSIUM CHLORIDE CRYS ER 20 MEQ PO TBCR
20.0000 meq | EXTENDED_RELEASE_TABLET | Freq: Every day | ORAL | Status: DC
  Administered 2019-10-12 – 2019-10-16 (×4): 20 meq via ORAL
  Filled 2019-10-12 (×4): qty 1

## 2019-10-12 MED ORDER — ONDANSETRON HCL 4 MG PO TABS: 4.0000 mg | ORAL_TABLET | Freq: Four times a day (QID) | ORAL | Status: DC | PRN

## 2019-10-12 MED ORDER — FUROSEMIDE 10 MG/ML IJ SOLN
40.0000 mg | Freq: Two times a day (BID) | INTRAMUSCULAR | Status: DC
  Administered 2019-10-12: 40 mg via INTRAVENOUS
  Filled 2019-10-12: qty 4

## 2019-10-12 MED ORDER — METHYLPREDNISOLONE SODIUM SUCC 125 MG IJ SOLR
125.0000 mg | Freq: Once | INTRAMUSCULAR | Status: AC
  Administered 2019-10-12: 10:00:00 125 mg via INTRAVENOUS
  Filled 2019-10-12: qty 2

## 2019-10-12 MED ORDER — ATORVASTATIN CALCIUM 20 MG PO TABS
20.0000 mg | ORAL_TABLET | Freq: Every evening | ORAL | Status: DC
  Administered 2019-10-12 – 2019-10-15 (×3): 20 mg via ORAL
  Filled 2019-10-12 (×3): qty 1

## 2019-10-12 MED ORDER — SODIUM CHLORIDE 0.9 % IV SOLN: 250.0000 mL | INTRAVENOUS | Status: DC | PRN

## 2019-10-12 MED ORDER — ALBUTEROL SULFATE (2.5 MG/3ML) 0.083% IN NEBU
2.5000 mg | INHALATION_SOLUTION | Freq: Once | RESPIRATORY_TRACT | Status: AC
  Administered 2019-10-12: 13:00:00 2.5 mg via RESPIRATORY_TRACT
  Filled 2019-10-12: qty 3

## 2019-10-12 MED ORDER — ADULT MULTIVITAMIN W/MINERALS CH
1.0000 | ORAL_TABLET | Freq: Every day | ORAL | Status: DC
  Administered 2019-10-13 – 2019-10-16 (×3): 1 via ORAL
  Filled 2019-10-12 (×3): qty 1

## 2019-10-12 MED ORDER — SODIUM CHLORIDE 0.9% FLUSH: 3.0000 mL | INTRAVENOUS | Status: DC | PRN

## 2019-10-12 MED ORDER — ACETAMINOPHEN 650 MG RE SUPP: 650.0000 mg | Freq: Four times a day (QID) | RECTAL | Status: DC | PRN

## 2019-10-12 MED ORDER — SODIUM CHLORIDE 0.9% FLUSH
3.0000 mL | Freq: Two times a day (BID) | INTRAVENOUS | Status: DC
  Administered 2019-10-12 – 2019-10-16 (×8): 3 mL via INTRAVENOUS

## 2019-10-12 MED ORDER — PANTOPRAZOLE SODIUM 40 MG IV SOLR
40.0000 mg | INTRAVENOUS | Status: DC
  Administered 2019-10-12 – 2019-10-15 (×4): 40 mg via INTRAVENOUS
  Filled 2019-10-12 (×5): qty 40

## 2019-10-12 MED ORDER — BUDESONIDE 0.25 MG/2ML IN SUSP
0.2500 mg | Freq: Two times a day (BID) | RESPIRATORY_TRACT | Status: DC
  Administered 2019-10-12 – 2019-10-16 (×8): 0.25 mg via RESPIRATORY_TRACT
  Filled 2019-10-12 (×10): qty 2

## 2019-10-12 MED ORDER — METOPROLOL TARTRATE 25 MG PO TABS
25.0000 mg | ORAL_TABLET | Freq: Two times a day (BID) | ORAL | Status: DC
  Administered 2019-10-12 – 2019-10-13 (×2): 25 mg via ORAL
  Filled 2019-10-12 (×2): qty 1

## 2019-10-12 NOTE — H&P (Addendum)
History and Physical    Jeffrey [name]:[id number] DOB: [birth year] DOA: 10/12/2019  PCP: Gareth Morgan, MD   Patient coming from: Home  Chief Complaint: Shortness of breath  HPI: Jeffrey [name] is a 58 y.o. male with medical history significant for former tobacco abuse with history of COPD and chronic hypoxemia on 4 L nasal cannula, former alcoholic, diastolic congestive heart failure-grade 2, hypertension, dyslipidemia, peripheral vascular disease, depression/anxiety, and bipolar disorder who presented to the emergency department who has been having gradual worsening of shortness of breath over the last 1 month with noted orthopnea as well as weight gain of approximately 5 pounds.  He has been taking his Lasix 40 mg daily at home otherwise.  He has had an acute change in the last 24 to 48 hours, however with some chest tightness and wheezing.  His wife had also noted that he is a little bit more confused and lethargic.  He denies any lower extremity edema and does have a mild cough that is nonproductive.  He has been taking his home breathing treatments with minimal improvement in his symptoms.   ED Course: Vital signs noted to be stable patient is afebrile.  Labs notable for mild hyponatremia with sodium 131 and ABG is notable for PCO2 of 72.7.  1 view chest x-ray with bilateral pulmonary edema noted and BNP is 306 with high-sensitivity troponin of 3.  He has been given some breathing treatments as well as Solu-Medrol and Lasix for combined COPD as well as CHF decompensation.  He remains on his current 4 L nasal cannula oxygen will be started on some BiPAP to assist with his CO2 elevation.  His Covid testing has returned negative.  Review of Systems: All others reviewed as noted above and otherwise negative.  Past Medical History:  Diagnosis Date   Acute blood loss anemia 02/07/2017   Anxiety    Arthritis    deg disease, bulging disk,  shoulder level   Bipolar disorder (HCC)     Bipolar disorder (HCC)    CHF (congestive heart failure) (HCC)    COPD, severe (HCC) 10/09/2016   Depression    anxiety   Hyperlipidemia    Hypertension    Peptic ulcer disease    Review   Pneumonia    [name] (peripheral vascular disease) (HCC) 06/18/2015   Shortness of breath     Past Surgical History:  Procedure Laterality Date   BIOPSY  02/09/2017   Procedure: BIOPSY;  Surgeon: [name], MD;  Location: [facility];  Service: Endoscopy;;  duodenal gastric   COLONOSCOPY  03/14/2007   DGL:OVFIEP colonoscopy and terminal ileoscopy except external hemorrhoids   COLONOSCOPY N/A 05/05/2013   [name]: external/internal anal canal hemorrhoids, unable to intubate TI, segemental biopsies unremarkable    COLONOSCOPY N/A 04/11/2017   Procedure: COLONOSCOPY;  Surgeon: [name], MD;  Location: [facility];  Service: Endoscopy;  Laterality: N/A;   COLONOSCOPY WITH PROPOFOL N/A 03/31/2017   Procedure: COLONOSCOPY WITH PROPOFOL;  Surgeon: [name], MD;  Location: [facility];  Service: Endoscopy;  Laterality: N/A;  1:45pm   ESOPHAGOGASTRODUODENOSCOPY  03/14/2007   PIR:JJOACZYSAY antral gastritis with bulbar duodenitis/paucity to postbulbar duodenal folds and biopsy were benign with no evidence of villous atrophy.   ESOPHAGOGASTRODUODENOSCOPY (EGD) WITH PROPOFOL N/A 02/09/2017   Procedure: ESOPHAGOGASTRODUODENOSCOPY (EGD) WITH PROPOFOL;  Surgeon: [name], MD;  Location: [facility];  Service: Endoscopy;  Laterality: N/A;   GIVENS CAPSULE STUDY N/A  04/08/2017   Procedure: GIVENS CAPSULE STUDY;  Surgeon: [name], MD;  Location: [facility];  Service: Endoscopy;  Laterality: N/A;   HAND SURGERY     left, secondary to self-inflicted laceration   HEMORRHOID SURGERY N/A 04/14/2017   Procedure: HEMORRHOIDECTOMY;  Surgeon: [name], MD;  Location: [facility];  Service: General;  Laterality: N/A;   SHOULDER SURGERY     right   TOE SURGERY      left great toe , amputated-lawnmover accident     reports that he has quit smoking. His smoking use included cigarettes. He started smoking about 48 years ago. He has a 40.00 pack-year smoking history. He has never used smokeless tobacco. He reports previous drug use. Drug: Marijuana. He reports that he does not drink alcohol.  Allergies  Allergen Reactions   Augmentin [Amoxicillin-Pot Clavulanate] Rash and Other (See Comments)    Has patient had a PCN reaction causing immediate rash, facial/tongue/throat swelling, SOB or lightheadedness with hypotension: No Has patient had a PCN reaction causing severe rash involving mucus membranes or skin necrosis: No Has patient had a PCN reaction that required hospitalization No Has patient had a PCN reaction occurring within the last 10 years: Yes If all of the above answers are "NO", then may proceed with Cephalosporin use.   Ace Inhibitors Hives    Family History  Problem Relation Age of Onset   Breast cancer Mother        deceased   Heart disease Father    Depression Daughter    Anxiety disorder Daughter    Anxiety disorder Son    Depression Son    Asthma Brother    Heart attack Maternal Aunt    Heart attack Maternal Uncle    Heart attack Paternal Aunt    Heart attack Paternal Uncle    Heart attack Maternal Grandmother    Heart attack Maternal Grandfather    Emphysema Maternal Grandfather    Heart attack Paternal Grandmother    Heart attack Paternal Grandfather    Colon cancer Neg Hx    Liver disease Neg Hx     Prior to Admission medications   Medication Sig Start Date End Date Taking? Authorizing Provider  albuterol (PROAIR HFA) 108 (90 Base) MCG/ACT inhaler Inhale 2 puffs into the lungs every 4 (four) hours as needed for wheezing or shortness of breath. 03/07/19  Yes Rhetta Mura, MD  albuterol (PROVENTIL) (2.5 MG/3ML) 0.083% nebulizer solution Take 3 mLs (2.5 mg total) by nebulization every 4 (four)  hours as needed for shortness of breath. 01/13/19  Yes Mikhail, Nita Sells, DO  aspirin EC 81 MG tablet Take 81 mg by mouth every evening.    Yes [provider]  atorvastatin (LIPITOR) 20 MG tablet Take 1 tablet (20 mg total) by mouth daily. For high cholesterol Patient taking differently: Take 20 mg by mouth every evening. For high cholesterol 02/09/18  Yes Money, Gerlene Burdock, FNP  dextromethorphan (DELSYM) 30 MG/5ML liquid Take 30 mg by mouth 2 (two) times daily as needed for cough.   Yes [provider]  diltiazem (CARDIZEM CD) 180 MG 24 hr capsule Take 1 capsule (180 mg total) by mouth daily. 03/08/19  Yes Rhetta Mura, MD  divalproex (DEPAKOTE ER) 500 MG 24 hr tablet Take 3 tablets (1,500 mg total) by mouth at bedtime. 08/17/19  Yes Myrlene Broker, MD  feeding supplement, ENSURE ENLIVE, (ENSURE ENLIVE) LIQD Take 237 mLs by mouth 2 (two) times daily between meals. 03/30/19  Yes Sherryll Burger, Daenerys Buttram D, DO  FLUoxetine (PROZAC) 40 MG capsule Take 1 capsule (40 mg total) by mouth daily. For mood control 08/17/19  Yes Myrlene Broker, MD  furosemide (LASIX) 20 MG tablet Take 2 tablets (40 mg total) by mouth daily for 30 days. 05/02/19 10/12/19 Yes Maryse Brierley D, DO  gabapentin (NEURONTIN) 400 MG capsule Take 1 capsule (400 mg total) by mouth 3 (three) times daily. 08/17/19  Yes Myrlene Broker, MD  guaiFENesin (MUCINEX) 600 MG 12 hr tablet Take 600 mg by mouth 2 (two) times daily.   Yes [provider]  ipratropium (ATROVENT) 0.02 % nebulizer solution Take 2.5 mLs (0.5 mg total) by nebulization 3 (three) times daily. 01/13/19  Yes Mikhail, Nita Sells, DO  methocarbamol (ROBAXIN) 500 MG tablet Take 1 tablet by mouth 3 (three) times daily. 03/16/19  Yes [provider]  metoprolol tartrate (LOPRESSOR) 25 MG tablet Take 1 tablet (25 mg total) by mouth 2 (two) times daily. Patient taking differently: Take 50 mg by mouth every evening.  03/07/19  Yes Rhetta Mura, MD  Multiple  Vitamin (MULTIVITAMIN WITH MINERALS) TABS tablet Take 1 tablet by mouth daily. 01/13/19  Yes Mikhail, Maryann, DO  OLANZapine (ZYPREXA) 10 MG tablet Take 1.5 tablets (15 mg total) by mouth at bedtime. For mood stability 08/17/19  Yes Myrlene Broker, MD  potassium chloride SA (KLOR-CON) 20 MEQ tablet Take 1 tablet by mouth daily. 07/17/19  Yes [provider]  traZODone (DESYREL) 50 MG tablet Take 100 mg by mouth at bedtime.   Yes [provider]  zolpidem (AMBIEN) 5 MG tablet TAKE 1 TABLET BY MOUTH AT BEDTIME FOR SLEEP 08/12/19   [provider]    Physical Exam: Vitals:   10/12/19 1232 10/12/19 1234 10/12/19 1237 10/12/19 1240  BP: 123/79     Pulse: 80 81 83 87  Resp: (!) 24 19 (!) 22 20  Temp:      TempSrc:      SpO2: 96% 97% 97% 99%  Weight:      Height:        Constitutional: NAD, calm, comfortable Vitals:   10/12/19 1232 10/12/19 1234 10/12/19 1237 10/12/19 1240  BP: 123/79     Pulse: 80 81 83 87  Resp: (!) 24 19 (!) 22 20  Temp:      TempSrc:      SpO2: 96% 97% 97% 99%  Weight:      Height:       Eyes: lids and conjunctivae normal ENMT: Mucous membranes are moist.  Neck: normal, supple Respiratory: clear to auscultation bilaterally. Normal respiratory effort. No accessory muscle use.  Cardiovascular: Regular rate and rhythm, no murmurs. No extremity edema. Abdomen: no tenderness, no distention. Bowel sounds positive.  Musculoskeletal:  No joint deformity upper and lower extremities.   Skin: no rashes, lesions, ulcers.  Psychiatric: Normal judgment and insight. Alert and oriented x 3. Normal mood.   Labs on Admission: I have personally reviewed following labs and imaging studies  CBC: Recent Labs  Lab 10/12/19 0930  WBC 6.8  NEUTROABS 5.5  HGB 11.0*  HCT 34.6*  MCV 87.6  PLT 189   Basic Metabolic Panel: Recent Labs  Lab 10/12/19 0930  NA 131*  K 4.1  CL 75*  CO2 45*  GLUCOSE 105*  BUN 5*  CREATININE 0.52*  CALCIUM 9.1    GFR: Estimated Creatinine Clearance: 102.1 mL/min (A) (by C-G formula based on SCr of 0.52 mg/dL (L)). Liver Function  Tests: No results for input(s): AST, ALT, ALKPHOS, BILITOT, PROT, ALBUMIN in the last 168 hours. No results for input(s): LIPASE, AMYLASE in the last 168 hours. No results for input(s): AMMONIA in the last 168 hours. Coagulation Profile: No results for input(s): INR, PROTIME in the last 168 hours. Cardiac Enzymes: No results for input(s): CKTOTAL, CKMB, CKMBINDEX, TROPONINI in the last 168 hours. BNP (last 3 results) No results for input(s): PROBNP in the last 8760 hours. HbA1C: No results for input(s): HGBA1C in the last 72 hours. CBG: No results for input(s): GLUCAP in the last 168 hours. Lipid Profile: No results for input(s): CHOL, HDL, LDLCALC, TRIG, CHOLHDL, LDLDIRECT in the last 72 hours. Thyroid Function Tests: No results for input(s): TSH, T4TOTAL, FREET4, T3FREE, THYROIDAB in the last 72 hours. Anemia Panel: No results for input(s): VITAMINB12, FOLATE, FERRITIN, TIBC, IRON, RETICCTPCT in the last 72 hours. Urine analysis:    Component Value Date/Time   COLORURINE STRAW (A) 10/12/2019 0921   APPEARANCEUR CLEAR 10/12/2019 0921   LABSPEC 1.003 (L) 10/12/2019 0921   PHURINE 7.0 10/12/2019 0921   GLUCOSEU NEGATIVE 10/12/2019 0921   HGBUR NEGATIVE 10/12/2019 0921   BILIRUBINUR NEGATIVE 10/12/2019 0921   KETONESUR NEGATIVE 10/12/2019 0921   PROTEINUR NEGATIVE 10/12/2019 0921   UROBILINOGEN 0.2 03/01/2011 1427   NITRITE NEGATIVE 10/12/2019 0921   LEUKOCYTESUR NEGATIVE 10/12/2019 0921    Radiological Exams on Admission: Dg Chest Port 1 View  Result Date: 10/12/2019 CLINICAL DATA:  Shortness of breath. EXAM: PORTABLE CHEST 1 VIEW COMPARISON:  April 30, 2019. FINDINGS: The heart size and mediastinal contours are within normal limits. No pneumothorax or pleural effusion is noted. Bibasilar interstitial densities are noted concerning for pulmonary edema. The  visualized skeletal structures are unremarkable. IMPRESSION: Findings and concerning for bilateral pulmonary edema. Electronically Signed   By: Lupita Raider M.D.   On: 10/12/2019 09:52    EKG: Independently reviewed.  Sinus rhythm 75 bpm.  Assessment/Plan Active Problems:   COPD with acute exacerbation (HCC)    Acute hypercapnic respiratory failure secondary to acute on chronic COPD exacerbation -Continue on BiPAP and monitor venous blood gas for improvement in PCO2 -Continue on Solu-Medrol as well as Pulmicort along with breathing treatments -No significant cough, fevers or chills noted -Continue Mucinex -Maintain on IV PPI daily while on steroids due to history of peptic ulcer disease  Acute on chronic diastolic heart failure decompensation -Lasix 40 mg IV twice daily with 80 mg IV given in ED -Monitor I's and O's -Last 2D echocardiogram on 12/19/2018 with LVEF 65-70% with grade 2 diastolic dysfunction -Recheck 2D echocardiogram  Hypertension -Continue Cardizem and metoprolol  Bipolar disorder-stable -Continue on Depakote, Prozac, Zyprexa, and trazodone  History of alcohol and tobacco abuse -Patient states that he is remaining abstinent  History of PAD -Continue aspirin and Lipitor  DVT prophylaxis: Lovenox Code Status: Full Family Communication: None at bedside Disposition Plan: Admit for treatment of COPD exacerbation as well as diuresis for heart failure.  We will also try on BiPAP for hypercapnia Consults called: None Admission status: Inpatient, stepdown unit   Mckenlee Mangham Hoover Brunette DO [facility] Pager [phone]  If 7PM-7AM, please contact night-coverage [email or web address] Password [email or web address]  10/12/2019, 1:13 PM

## 2019-10-12 NOTE — ED Notes (Signed)
CRITICAL VALUE ALERT  Critical Value:  pCO 72.7  Date & Time Notied:  10/12/2019 @ 2020  Provider Notified: Dr Roderic Palau  Orders Received/Actions taken: see new orders.

## 2019-10-12 NOTE — Progress Notes (Signed)
Patient received 1mg  Ativan IV and it has not been effective. Patient has continually pulled his oyxgen off as well as the telemetry leads and oxygen probe. The tele sitter was placed in room with patient and that was ineffective also. The NT is having to sit in the room with the patient to keep him in the bed and to stop him from pulling at lines. MD notified.

## 2019-10-12 NOTE — ED Notes (Signed)
ED TO INPATIENT HANDOFF REPORT  ED Nurse Name and Phone #: Dorian Duval [phone]  S Name/Age/Gender Jeffrey [name] 58 y.o. male Room/Bed: APA14/APA14  Code Status   Code Status: Full Code  Home/SNF/Other Home Patient oriented to: self, place and time Is this baseline? Yes   Triage Complete: Triage complete  Chief Complaint shortness of breath  Triage Note Patient presents to the ED with SOB for one month.  Patient states he has COPD and is on continuous oxygen at home 4L.  Patient also states he is "delusional".     Allergies Allergies  Allergen Reactions  . Augmentin [Amoxicillin-Pot Clavulanate] Rash and Other (See Comments)    Has patient had a PCN reaction causing immediate rash, facial/tongue/throat swelling, SOB or lightheadedness with hypotension: No Has patient had a PCN reaction causing severe rash involving mucus membranes or skin necrosis: No Has patient had a PCN reaction that required hospitalization No Has patient had a PCN reaction occurring within the last 10 years: Yes If all of the above answers are "NO", then may proceed with Cephalosporin use.  Donivan Scull Inhibitors Hives    Level of Care/Admitting Diagnosis ED Disposition    ED Disposition Condition Comment   Admit  Hospital Area: [facility] [[id number]]  Level of Care: Stepdown [14]  Covid Evaluation: Confirmed COVID Negative  Date Laboratory Confirmed COVID Negative: 10/12/2019  Diagnosis: COPD with acute exacerbation [facility]) [[id number]]  Admitting Physician: Erick Blinks [[id number]]  Attending Physician: Erick Blinks [[id number]]  Estimated length of stay: past midnight tomorrow  Certification:: I certify this patient will need inpatient services for at least 2 midnights  PT Class (Do Not Modify): Inpatient [101]  PT Acc Code (Do Not Modify): Private [1]       B Medical/Surgery History Past Medical History:  Diagnosis Date  . Acute blood loss anemia 02/07/2017  . Anxiety   . Arthritis    deg disease, bulging disk,  shoulder level  . Bipolar disorder (HCC)   . Bipolar disorder (HCC)   . CHF (congestive heart failure) (HCC)   . 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 Depression    anxiety  . Hyperlipidemia   . Hypertension   . Peptic ulcer disease    Review  . Pneumonia   . [name] (peripheral vascular disease) (HCC) 06/18/2015  . Shortness of breath    Past Surgical History:  Procedure Laterality Date  . BIOPSY  02/09/2017   Procedure: BIOPSY;  Surgeon: [name], MD;  [facility];  Service: Endoscopy;;  duodenal gastric  . COLONOSCOPY  03/14/2007   [id number] colonoscopy and terminal ileoscopy except external hemorrhoids  . COLONOSCOPY N/A 05/05/2013   [name]: external/internal anal canal hemorrhoids, unable to intubate TI, segemental biopsies unremarkable   . COLONOSCOPY N/A 04/11/2017   Procedure: COLONOSCOPY;  Surgeon: [name], MD;  Location: [facility];  Service: Endoscopy;  Laterality: N/A;  . COLONOSCOPY WITH PROPOFOL N/A 03/31/2017   Procedure: COLONOSCOPY WITH PROPOFOL;  Surgeon: [name], MD;  Location[facility];  Service: Endoscopy;  Laterality: N/A;  1:45pm  . ESOPHAGOGASTRODUODENOSCOPY  03/14/2007   [id number] antral gastritis with bulbar duodenitis/paucity to postbulbar duodenal folds and biopsy were benign with no evidence of villous atrophy.  . ESOPHAGOGASTRODUODENOSCOPY (EGD) WITH PROPOFOL N/A 02/09/2017   Procedure: ESOPHAGOGASTRODUODENOSCOPY (EGD) WITH PROPOFOL;  Surgeon: [name], MD;  [facility];  Service: Endoscopy;  Laterality: N/A;  . GIVENS CAPSULE STUDY N/A 04/08/2017  Procedure: GIVENS CAPSULE STUDY;  Surgeon: Rourk, Robert M, MD;  Location[facility];  Service: Endoscopy;  Laterality: N/A;  . HAND SURGERY     left, secondary to self-inflicted laceration  . HEMORRHOID SURGERY N/A 5/23/2018[name]   Procedure: HEMORRHOIDECTOMY;  Surgeon: Franky MachoJenkins, Mark, MD;  Location: [facility];  Service: General;   Laterality: N/A;  . SHOULDER SURGERY     right  . TOE SURGERY     left great toe , amputated-lawnmover accident     A IV Location/Drains/Wounds Patient Lines/Drains/Airways Status   Active Line/Drains/Airways    Name:   Placement date:   Placement time:   Site:   Days:   Peripheral IV 04/30/19 Right Wrist   04/30/19    1340    Wrist   165   Peripheral IV 10/12/19 Right Antecubital   10/12/19    0933    Antecubital   less than 1   Wound / Incision (Open or Dehisced) 12/30/18 Elbow Posterior;Right skin tear   12/30/18    2000    Elbow   286          Intake/Output Last 24 hours  Intake/Output Summary (Last 24 hours) at 10/12/2019 1531 Last data filed at 10/12/2019 1326 Gross per 24 hour  Intake -  Output 1550 ml  Net -1550 ml    Labs/Imaging Results for orders placed or performed during the hospital encounter of 10/12/19 (from the past 48 hour(s))  Urinalysis, Routine w reflex microscopic     Status: Abnormal   Collection Time: 10/12/19  9:21 AM  Result Value Ref Range   Color, Urine STRAW (A) YELLOW   APPearance CLEAR CLEAR   Specific Gravity, Urine 1.003 (L) 1.005 - 1.030   pH 7.0 5.0 - 8.0   Glucose, UA NEGATIVE NEGATIVE mg/dL   Hgb urine dipstick NEGATIVE NEGATIVE   Bilirubin Urine NEGATIVE NEGATIVE   Ketones, ur NEGATIVE NEGATIVE mg/dL   Protein, ur NEGATIVE NEGATIVE mg/dL   Nitrite NEGATIVE NEGATIVE   Leukocytes,Ua NEGATIVE NEGATIVE    Comment: Performed at [facility], [address] [place], [place] [address]  Basic metabolic panel     Status: Abnormal   Collection Time: 10/12/19  9:30 AM  Result Value Ref Range   Sodium 131 (L) 135 - 145 mmol/L   Potassium 4.1 3.5 - 5.1 mmol/L   Chloride 75 (L) 98 - 111 mmol/L   CO2 45 (H) 22 - 32 mmol/L   Glucose, Bld 105 (H) 70 - 99 mg/dL   BUN 5 (L) 6 - 20 mg/dL   Creatinine, Ser 1.910.52 (L) 0.61 - 1.24 mg/dL   Calcium 9.1 8.9 - 47.810.3 mg/dL   GFR calc non Af Amer >60 >60 mL/min   GFR calc Af Amer >60 >60 mL/min    Anion gap 11 5 - 15    Comment: Performed at [facility], [address] [place], [place] [address]  CBC with Differential/Platelet     Status: Abnormal   Collection Time: 10/12/19  9:30 AM  Result Value Ref Range   WBC 6.8 4.0 - 10.5 K/uL   RBC 3.95 (L) 4.22 - 5.81 MIL/uL   Hemoglobin 11.0 (L) 13.0 - 17.0 g/dL   HCT 13.034.6 (L) 86.539.0 - 78.452.0 %   MCV 87.6 80.0 - 100.0 fL   MCH 27.8 26.0 - 34.0 pg   MCHC 31.8 30.0 - 36.0 g/dL   RDW 69.612.6 29.511.5 - 28.415.5 %   Platelets 189 150 - 400 K/uL  nRBC 0.0 0.0 - 0.2 %   Neutrophils Relative % 83 %   Neutro Abs 5.5 1.7 - 7.7 K/uL   Lymphocytes Relative 10 %   Lymphs Abs 0.7 0.7 - 4.0 K/uL   Monocytes Relative 7 %   Monocytes Absolute 0.5 0.1 - 1.0 K/uL   Eosinophils Relative 0 %   Eosinophils Absolute 0.0 0.0 - 0.5 K/uL   Basophils Relative 0 %   Basophils Absolute 0.0 0.0 - 0.1 K/uL   Immature Granulocytes 0 %   Abs Immature Granulocytes 0.02 0.00 - 0.07 K/uL    Comment: Performed at [facility], [address], [place], [place] [address]  Brain natriuretic peptide     Status: Abnormal   Collection Time: 10/12/19  9:30 AM  Result Value Ref Range   B Natriuretic Peptide 306.0 (H) 0.0 - 100.0 pg/mL    Comment: Performed at [facility], [address], [place], [place] [address]  Troponin I (High Sensitivity)     Status: None   Collection Time: 10/12/19  9:30 AM  Result Value Ref Range   Troponin I (High Sensitivity) 3 <18 ng/L    Comment: (NOTE) Elevated high sensitivity troponin I (hsTnI) values and significant  changes across serial measurements may suggest ACS but many other  chronic and acute conditions are known to elevate hsTnI results.  Refer to the "Links" section for chest pain algorithms and additional  guidance. Performed at [facility], [address], [place], [place] [address]   POC SARS Coronavirus 2 Ag     Status: None   Collection Time: 10/12/19 10:00 AM  Result Value Ref Range   SARS Coronavirus 2 Ag NEGATIVE NEGATIVE     Comment: (NOTE) SARS-CoV-2 antigen NOT DETECTED.  Negative results are presumptive.  Negative results do not preclude SARS-CoV-2 infection and should not be used as the sole basis for treatment or other patient management decisions, including infection  control decisions, particularly in the presence of clinical signs and  symptoms consistent with COVID-19, or in those who have been in contact with the virus.  Negative results must be combined with clinical observations, patient history, and epidemiological information. The expected result is Negative. Fact Sheet for Patients: PodPark.tn Fact Sheet for Healthcare Providers: GiftContent.is This test is not yet approved or cleared by the [place] FDA and  has been authorized for detection and/or diagnosis of SARS-CoV-2 by FDA under an Emergency Use Authorization (EUA).  This EUA will remain in effect (meaning this test can be used) for the duration of  the COVID-19 de claration under Section 564(b)(1) of the Act, 21 U.S.C. section 360bbb-3(b)(1), unless the authorization is terminated or revoked sooner.   POC SARS Coronavirus 2 Ag     Status: None   Collection Time: 10/12/19 10:00 AM  Result Value Ref Range   SARS Coronavirus 2 Ag NEGATIVE NEGATIVE    Comment: (NOTE) SARS-CoV-2 antigen NOT DETECTED.  Negative results are presumptive.  Negative results do not preclude SARS-CoV-2 infection and should not be used as the sole basis for treatment or other patient management decisions, including infection  control decisions, particularly in the presence of clinical signs and  symptoms consistent with COVID-19, or in those who have been in contact with the virus.  Negative results must be combined with clinical observations, patient history, and epidemiological information. The expected result is Negative. Fact Sheet for Patients: PodPark.tn Fact  Sheet for Healthcare Providers: GiftContent.is This test is not yet approved or cleared by the  [place] [employer] trader and  has been authorized for detection and/or diagnosis of SARS-CoV-2 by FDA under an TEFL teacher (EUA).  This EUA will remain in effect (meaning this test can be used) for the duration of  the COVID-19 de claration under Section 564(b)(1) of the Act, 21 U.S.C. section 360bbb-3(b)(1), unless the authorization is terminated or revoked sooner.   Blood gas, arterial (at [facility])     Status: Abnormal   Collection Time: 10/12/19 10:03 AM  Result Value Ref Range   FIO2 36.00    pH, Arterial 7.411 7.350 - 7.450   pCO2 arterial 72.7 (HH) 32.0 - 48.0 mmHg    Comment: CRITICAL RESULT CALLED TO, READ BACK BY AND VERIFIED WITH: MARTIN,D AT 10:40AM ON 10/12/19 BY FESTERMAN,C    pO2, Arterial 73.4 (L) 83.0 - 108.0 mmHg   Bicarbonate 41.6 (H) 20.0 - 28.0 mmol/L   Acid-Base Excess 19.4 (H) 0.0 - 2.0 mmol/L   O2 Saturation 95.3 %   Patient temperature 38.0    Allens test (pass/fail) PASS PASS    Comment: Performed at [facility], [address] [place], [place] [address]  SARS Coronavirus 2 by RT PCR (hospital order, performed in [facility] hospital lab) Nasopharyngeal Nasopharyngeal Swab     Status: None   Collection Time: 10/12/19 11:39 AM   Specimen: Nasopharyngeal Swab  Result Value Ref Range   SARS Coronavirus 2 NEGATIVE NEGATIVE    Comment: (NOTE) If result is NEGATIVE SARS-CoV-2 target nucleic acids are NOT DETECTED. The SARS-CoV-2 RNA is generally detectable in upper and lower  respiratory specimens during the acute phase of infection. The lowest  concentration of SARS-CoV-2 viral copies this assay can detect is 250  copies / mL. A negative result does not preclude SARS-CoV-2 infection  and should not be used as the sole basis for treatment or other  patient management decisions.  A negative result may occur with  improper  specimen collection / handling, submission of specimen other  than nasopharyngeal swab, presence of viral mutation(s) within the  areas targeted by this assay, and inadequate number of viral copies  (<250 copies / mL). A negative result must be combined with clinical  observations, patient history, and epidemiological information. If result is POSITIVE SARS-CoV-2 target nucleic acids are DETECTED. The SARS-CoV-2 RNA is generally detectable in upper and lower  respiratory specimens dur ing the acute phase of infection.  Positive  results are indicative of active infection with SARS-CoV-2.  Clinical  correlation with patient history and other diagnostic information is  necessary to determine patient infection status.  Positive results do  not rule out bacterial infection or co-infection with other viruses. If result is PRESUMPTIVE POSTIVE SARS-CoV-2 nucleic acids MAY BE PRESENT.   A presumptive positive result was obtained on the submitted specimen  and confirmed on repeat testing.  While 2019 novel coronavirus  (SARS-CoV-2) nucleic acids may be present in the submitted sample  additional confirmatory testing may be necessary for epidemiological  and / or clinical management purposes  to differentiate between  SARS-CoV-2 and other Sarbecovirus currently known to infect humans.  If clinically indicated additional testing with an alternate test  methodology [phone]) is advised. The SARS-CoV-2 RNA is generally  detectable in upper and lower respiratory sp ecimens during the acute  phase of infection. The expected result is Negative. Fact Sheet for Patients:  BoilerBrush.com.cy Fact Sheet for Healthcare Providers: https://pope.com/ This test is not yet approved or cleared by the [place] FDA  and has been authorized for detection and/or diagnosis of SARS-CoV-2 by FDA under an Emergency Use Authorization (EUA).  This EUA will remain in  effect (meaning this test can be used) for the duration of the COVID-19 declaration under Section 564(b)(1) of the Act, 21 U.S.C. section 360bbb-3(b)(1), unless the authorization is terminated or revoked sooner. Performed at [facility], [address], [place], [place] [address]    Dg Chest Port 1 View  Result Date: 10/12/2019 CLINICAL DATA:  Shortness of breath. EXAM: PORTABLE CHEST 1 VIEW COMPARISON:  April 30, 2019. FINDINGS: The heart size and mediastinal contours are within normal limits. No pneumothorax or pleural effusion is noted. Bibasilar interstitial densities are noted concerning for pulmonary edema. The visualized skeletal structures are unremarkable. IMPRESSION: Findings and concerning for bilateral pulmonary edema. Electronically Signed   By: Lupita Raider M.D.   On: 10/12/2019 09:52    Pending Labs Unresulted Labs (From admission, onward)    Start     Ordered   10/19/19 0500  Creatinine, serum  (enoxaparin (LOVENOX)    CrCl >/= 30 ml/min)  Weekly,   R    Comments: while on enoxaparin therapy    10/12/19 1329   10/13/19 0500  Blood gas, venous  Tomorrow morning,   R     10/12/19 1311   10/13/19 0500  Magnesium  Tomorrow morning,   R     10/12/19 1329   10/13/19 0500  Basic metabolic panel  Tomorrow morning,   R     10/12/19 1329   10/13/19 0500  CBC  Tomorrow morning,   R     10/12/19 1329   10/12/19 1600  Blood gas, venous  Once,   R     10/12/19 1311   10/12/19 1500  MRSA PCR Screening  Once,   STAT    Question:  Patient immune status  Answer:  Normal   10/12/19 1459          Vitals/Pain Today's Vitals   10/12/19 1240 10/12/19 1300 10/12/19 1315 10/12/19 1330  BP:  (!) 114/95  (!) 128/110  Pulse: 87 (!) 121 (!) 101 82  Resp: 20 (!) 27 (!) 23 (!) 21  Temp:      TempSrc:      SpO2: 99% (!) 85% (!) 89% 96%  Weight:      Height:      PainSc:        Isolation Precautions No active isolations  Medications Medications  ALPRAZolam (XANAX) tablet 0.25  mg (0.25 mg Oral Given 10/12/19 1310)  ipratropium-albuterol (DUONEB) 0.5-2.5 (3) MG/3ML nebulizer solution 3 mL (3 mLs Nebulization Given 10/12/19 1518)  methylPREDNISolone sodium succinate (SOLU-MEDROL) 40 mg/mL injection 40 mg (has no administration in time range)  furosemide (LASIX) injection 40 mg (has no administration in time range)  pantoprazole (PROTONIX) injection 40 mg (40 mg Intravenous Given 10/12/19 1458)  budesonide (PULMICORT) nebulizer solution 0.25 mg (0.25 mg Nebulization Not Given 10/12/19 1443)  aspirin EC tablet 81 mg (has no administration in time range)  atorvastatin (LIPITOR) tablet 20 mg (has no administration in time range)  diltiazem (CARDIZEM CD) 24 hr capsule 180 mg (has no administration in time range)  metoprolol tartrate (LOPRESSOR) tablet 25 mg (has no administration in time range)  FLUoxetine (PROZAC) capsule 40 mg (40 mg Oral Given 10/12/19 1458)  OLANZapine (ZYPREXA) tablet 15 mg (has no administration in time range)  traZODone (DESYREL) tablet 100 mg (has no administration in time range)  divalproex (DEPAKOTE  ER) 24 hr tablet 1,500 mg (has no administration in time range)  gabapentin (NEURONTIN) capsule 400 mg (has no administration in time range)  methocarbamol (ROBAXIN) tablet 500 mg (has no administration in time range)  feeding supplement (ENSURE ENLIVE) (ENSURE ENLIVE) liquid 237 mL (has no administration in time range)  multivitamin with minerals tablet 1 tablet (has no administration in time range)  potassium chloride SA (KLOR-CON) CR tablet 20 mEq (20 mEq Oral Given 10/12/19 1457)  dextromethorphan (DELSYM) 30 MG/5ML liquid 30 mg (has no administration in time range)  guaiFENesin (MUCINEX) 12 hr tablet 600 mg (600 mg Oral Given 10/12/19 1457)  enoxaparin (LOVENOX) injection 40 mg (40 mg Subcutaneous Given 10/12/19 1458)  sodium chloride flush (NS) 0.9 % injection 3 mL (has no administration in time range)  sodium chloride flush (NS) 0.9 % injection  3 mL (has no administration in time range)  0.9 %  sodium chloride infusion (has no administration in time range)  acetaminophen (TYLENOL) tablet 650 mg (has no administration in time range)    Or  acetaminophen (TYLENOL) suppository 650 mg (has no administration in time range)  ondansetron (ZOFRAN) tablet 4 mg (has no administration in time range)    Or  ondansetron (ZOFRAN) injection 4 mg (has no administration in time range)  Chlorhexidine Gluconate Cloth 2 % PADS 6 each (has no administration in time range)  methylPREDNISolone sodium succinate (SOLU-MEDROL) 125 mg/2 mL injection 125 mg (125 mg Intravenous Given 10/12/19 0934)  albuterol (VENTOLIN HFA) 108 (90 Base) MCG/ACT inhaler 6 puff (6 puffs Inhalation Given 10/12/19 0934)  furosemide (LASIX) injection 80 mg (80 mg Intravenous Given 10/12/19 1018)  ipratropium-albuterol (DUONEB) 0.5-2.5 (3) MG/3ML nebulizer solution 3 mL (3 mLs Nebulization Given 10/12/19 1231)  albuterol (PROVENTIL) (2.5 MG/3ML) 0.083% nebulizer solution 2.5 mg (2.5 mg Nebulization Given 10/12/19 1231)    Mobility walks High fall risk   Focused Assessments    R Recommendations: See Admitting Provider Note  Report given to:   Additional Notes:

## 2019-10-12 NOTE — ED Notes (Signed)
resp at bedside to begin bipap

## 2019-10-12 NOTE — ED Triage Notes (Signed)
Patient presents to the ED with SOB for one month.  Patient states he has COPD and is on continuous oxygen at home 4L.  Patient also states he is "delusional".

## 2019-10-12 NOTE — Progress Notes (Signed)
Patient admitted to stepdown unit. Very confused, states he is hallucinating. Has really bad tremors but states that is chronic. States he does smoke cigarettes. Says he does not drink alcohol. Has pulled off his nasal cannula. Has pulled off telemetry leads. Will not rest in the bed. Has received a PO xanax from the ED nurse but not effective. MD notified.

## 2019-10-12 NOTE — ED Notes (Addendum)
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

## 2019-10-12 NOTE — ED Notes (Signed)
Dr Manuella Ghazi notified bipap dc'ed and 4L nasal cannula applied because pt continues to remove bipap and get out of bed. Pt redirected frequently and agrees to stay in bed then gets up again. Pt also removes bipap while still in bed.

## 2019-10-12 NOTE — ED Provider Notes (Signed)
[facility] EMERGENCY DEPARTMENT Provider Note   CSN: [id number] Arrival date & time: 10/12/19  0175     History   Chief Complaint Chief Complaint  Patient presents with  . Shortness of Breath    HPI Jeffrey [name] is a 58 y.o. male with a history of CHF, COPD, hypertension, PUD and history of pneumonia reporting a 1 month history of increasing shortness of breath which has been worse over the past 24 hours.  He also reports having some confusion which is confirmed by his ex-wife who brought him to the ED today.  He is chronically on home oxygen at 4 L at baseline.  History is somewhat limited as patient has some confusion at this time.  He does endorse that his breathing is worse when he lies down, he is unsure if he has had any fevers.  He has had a nonproductive cough, denies fluid in his ankles.  He has taken his home albuterol MDI this morning with minimal improvement in symptoms.   Ex-wife endorses that he has been having increased difficulty with sleeping.  His PCP increased his nighttime trazodone to 100 mg nightly for the past 2 nights, he endorses still having difficulty sleeping.  He does not know what is keeping him awake, specifically unsure if it is the shortness of breath or another issue.  Level 5 caveat     The history is provided by the patient (ex wife ). The history is limited by the condition of the patient (confusion).    Past Medical History:  Diagnosis Date  . Acute blood loss anemia 02/07/2017  . Anxiety   . Arthritis    deg disease, bulging disk,  shoulder level  . Bipolar disorder (HCC)   . Bipolar disorder (HCC)   . CHF (congestive heart failure) (HCC)   . 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 Depression    anxiety  . Hyperlipidemia   . Hypertension   . Peptic ulcer disease    Review  . Pneumonia   . [name] (peripheral vascular disease) (HCC) 06/18/2015  . Shortness of breath     Patient Active Problem List   Diagnosis Date Noted  . Respiratory  failure with hypoxia (HCC) 04/30/2019  . Acute on chronic respiratory failure with hypoxia (HCC) 04/30/2019  . Bronchiectasis (HCC) 03/24/2019  . Fever 03/24/2019  . Cough   . Dysphagia   . Palliative care by specialist   . Goals of care, counseling/discussion   . Hepatic encephalopathy (HCC)   . COPD exacerbation (HCC) 09/16/2018  . Essential hypertension 09/16/2018  . GERD (gastroesophageal reflux disease) 09/16/2018  . Hypoxia   . Chronic diastolic CHF (congestive heart failure) (HCC)   . Bipolar disorder with severe depression (HCC) 02/01/2018  . Alcohol use disorder, severe, dependence (HCC) 02/01/2018  . Cannabis use disorder, severe, dependence (HCC) 02/01/2018  . MDD (major depressive disorder), recurrent episode, severe (HCC) 01/31/2018  . Internal and external bleeding hemorrhoids   . Rectal bleeding   . Iron deficiency anemia due to chronic blood loss 03/09/2017  . Anemia   . GIB (gastrointestinal bleeding) 02/07/2017  . Peptic ulcer disease   . Bipolar disorder (HCC)   . 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 H/O: depression 08/26/2016  . Dyslipidemia 08/26/2016  . Tobacco use disorder 08/26/2016  . [name] (peripheral vascular disease) (HCC) 06/18/2015  . Chronic diarrhea 05/02/2013  . Hematochezia 05/02/2013  . Abnormal weight loss 05/02/2013  . Abdominal pain, periumbilical 05/02/2013    Past  Surgical History:  Procedure Laterality Date  . BIOPSY  02/09/2017   Procedure: BIOPSY;  Surgeon: [name], MD;  Location: [facility];  Service: Endoscopy;;  duodenal gastric  . COLONOSCOPY  03/14/2007   LKG:[id number] colonoscopy and terminal ileoscopy except external hemorrhoids  . COLONOSCOPY N/A 05/05/2013   [name]: external/internal anal canal hemorrhoids, unable to intubate TI, segemental biopsies unremarkable   . COLONOSCOPY N/A 04/11/2017   Procedure: COLONOSCOPY;  Surgeon: [name], MD;  Location: [facility];  Service: Endoscopy;  Laterality: N/A;  .  COLONOSCOPY WITH PROPOFOL N/A 03/31/2017   Procedure: COLONOSCOPY WITH PROPOFOL;  Surgeon: [name], MD;  Location: [facility];  Service: Endoscopy;  Laterality: N/A;  1:45pm  . ESOPHAGOGASTRODUODENOSCOPY  03/14/2007   OZD:[id number] antral gastritis with bulbar duodenitis/paucity to postbulbar duodenal folds and biopsy were benign with no evidence of villous atrophy.  . ESOPHAGOGASTRODUODENOSCOPY (EGD) WITH PROPOFOL N/A 02/09/2017   Procedure: ESOPHAGOGASTRODUODENOSCOPY (EGD) WITH PROPOFOL;  Surgeon: [name], MD;  Location: [facility];  Service: Endoscopy;  Laterality: N/A;  . GIVENS CAPSULE STUDY N/A 04/08/2017   Procedure: GIVENS CAPSULE STUDY;  Surgeon: [name], MD;  Location: [facility];  Service: Endoscopy;  Laterality: N/A;  . HAND SURGERY     left, secondary to self-inflicted laceration  . HEMORRHOID SURGERY N/A 04/14/2017   Procedure: HEMORRHOIDECTOMY;  Surgeon: [name], MD;  Location: [facility];  Service: General;  Laterality: N/A;  . SHOULDER SURGERY     right  . TOE SURGERY     left great toe , amputated-lawnmover accident        Home Medications    Prior to Admission medications   Medication Sig Start Date End Date Taking? Authorizing Provider  albuterol (PROAIR HFA) 108 (90 Base) MCG/ACT inhaler Inhale 2 puffs into the lungs every 4 (four) hours as needed for wheezing or shortness of breath. 03/07/19   Rhetta Mura, MD  albuterol (PROVENTIL) (2.5 MG/3ML) 0.083% nebulizer solution Take 3 mLs (2.5 mg total) by nebulization every 4 (four) hours as needed for shortness of breath. 01/13/19   Edsel Petrin, DO  aspirin EC 81 MG tablet Take 81 mg by mouth every evening.     [provider]  atorvastatin (LIPITOR) 20 MG tablet Take 1 tablet (20 mg total) by mouth daily. For high cholesterol Patient taking differently: Take 20 mg by mouth every evening. For high cholesterol 02/09/18   Money, Gerlene Burdock, FNP  diltiazem (CARDIZEM CD) 180 MG  24 hr capsule Take 1 capsule (180 mg total) by mouth daily. 03/08/19   Rhetta Mura, MD  divalproex (DEPAKOTE ER) 500 MG 24 hr tablet Take 3 tablets (1,500 mg total) by mouth at bedtime. 08/17/19   Myrlene Broker, MD  feeding supplement, ENSURE ENLIVE, (ENSURE ENLIVE) LIQD Take 237 mLs by mouth 2 (two) times daily between meals. 03/30/19   Sherryll Burger, Pratik D, DO  FLUoxetine (PROZAC) 40 MG capsule Take 1 capsule (40 mg total) by mouth daily. For mood control 08/17/19   Myrlene Broker, MD  furosemide (LASIX) 20 MG tablet Take 2 tablets (40 mg total) by mouth daily for 30 days. 05/02/19 06/01/19  Sherryll Burger, Pratik D, DO  gabapentin (NEURONTIN) 400 MG capsule Take 1 capsule (400 mg total) by mouth 3 (three) times daily. 08/17/19   Myrlene Broker, MD  ipratropium (ATROVENT) 0.02 % nebulizer solution Take 2.5 mLs (0.5 mg total) by nebulization 3 (three) times daily. 01/13/19  Mikhail, Nita SellsMaryann, DO  methocarbamol (ROBAXIN) 500 MG tablet Take 1 tablet by mouth 3 (three) times daily. 03/16/19   [provider]  metoprolol tartrate (LOPRESSOR) 25 MG tablet Take 1 tablet (25 mg total) by mouth 2 (two) times daily. Patient taking differently: Take 50 mg by mouth every evening.  03/07/19   Rhetta MuraSamtani, Jai-Gurmukh, MD  Multiple Vitamin (MULTIVITAMIN WITH MINERALS) TABS tablet Take 1 tablet by mouth daily. 01/13/19   Mikhail, Nita SellsMaryann, DO  OLANZapine (ZYPREXA) 10 MG tablet Take 1.5 tablets (15 mg total) by mouth at bedtime. For mood stability 08/17/19   Myrlene Brokeross, Deborah R, MD  potassium chloride SA (KLOR-CON) 20 MEQ tablet Take 1 tablet by mouth daily. 07/17/19   [provider]  zolpidem (AMBIEN) 5 MG tablet TAKE 1 TABLET BY MOUTH AT BEDTIME FOR SLEEP 08/12/19   [provider]    Family History Family History  Problem Relation Age of Onset  . Breast cancer Mother        deceased  . Heart disease Father   . Depression Daughter   . Anxiety disorder Daughter   . Anxiety disorder Son   . Depression Son    . Asthma Brother   . Heart attack Maternal Aunt   . Heart attack Maternal Uncle   . Heart attack Paternal Aunt   . Heart attack Paternal Uncle   . Heart attack Maternal Grandmother   . Heart attack Maternal Grandfather   . Emphysema Maternal Grandfather   . Heart attack Paternal Grandmother   . Heart attack Paternal Grandfather   . Colon cancer Neg Hx   . Liver disease Neg Hx     Social History Social History   Tobacco Use  . Smoking status: Former Smoker    Packs/day: 1.00    Years: 40.00    Pack years: 40.00    Types: Cigarettes    Start date: 08/04/1971  . Smokeless tobacco: Never Used  . Tobacco comment: peak rate of 2.5ppd, 1/2ppd on 11/27/2016 -- 6 cigarettes / day 01/25/18  Substance Use Topics  . Alcohol use: No    Alcohol/week: 0.0 standard drinks  . Drug use: Not Currently    Types: Marijuana    Comment: most days      Allergies   Augmentin [amoxicillin-pot clavulanate] and Ace inhibitors   Review of Systems Review of Systems  Unable to perform ROS: Mental status change  Respiratory: Positive for cough, shortness of breath and wheezing.   Psychiatric/Behavioral: Positive for confusion.     Physical Exam Updated Vital Signs BP 119/78   Pulse 74   Temp 98.3 F (36.8 C) (Oral)   Resp (!) 25   Ht 5\' 10"  (1.778 m)   Wt 72.6 kg   SpO2 96%   BMI 22.96 kg/m   Physical Exam Vitals signs and nursing note reviewed.  Constitutional:      Appearance: He is well-developed.  HENT:     Head: Normocephalic and atraumatic.  Eyes:     Conjunctiva/sclera: Conjunctivae normal.  Neck:     Musculoskeletal: Normal range of motion.     Vascular: No JVD.  Cardiovascular:     Rate and Rhythm: Normal rate and regular rhythm.     Heart sounds: Normal heart sounds.  Pulmonary:     Breath sounds: Decreased breath sounds and wheezing present. No rhonchi.     Comments: Very little air movement in all lung fields with a faint expiratory wheeze. Chest:     Chest  wall: No tenderness.  Abdominal:     General: Bowel sounds are normal.     Palpations: Abdomen is soft.     Tenderness: There is no abdominal tenderness.  Musculoskeletal: Normal range of motion.     Right lower leg: No edema.     Left lower leg: No edema.  Skin:    General: Skin is warm and dry.     Capillary Refill: Capillary refill takes less than 2 seconds.  Neurological:     Mental Status: He is alert. He is confused.     Cranial Nerves: Cranial nerves are intact.     Motor: Motor function is intact.     Comments: Difficult understanding and responding to all questions appropriately.  Patient is alert.      ED Treatments / Results  Labs (all labs ordered are listed, but only abnormal results are displayed) Labs Reviewed  BASIC METABOLIC PANEL - Abnormal; Notable for the following components:      Result Value   Sodium 131 (*)    Chloride 75 (*)    CO2 45 (*)    Glucose, Bld 105 (*)    BUN 5 (*)    Creatinine, Ser 0.52 (*)    All other components within normal limits  CBC WITH DIFFERENTIAL/PLATELET - Abnormal; Notable for the following components:   RBC 3.95 (*)    Hemoglobin 11.0 (*)    HCT 34.6 (*)    All other components within normal limits  BRAIN NATRIURETIC PEPTIDE - Abnormal; Notable for the following components:   B Natriuretic Peptide 306.0 (*)    All other components within normal limits  URINALYSIS, ROUTINE W REFLEX MICROSCOPIC - Abnormal; Notable for the following components:   Color, Urine STRAW (*)    Specific Gravity, Urine 1.003 (*)    All other components within normal limits  BLOOD GAS, ARTERIAL - Abnormal; Notable for the following components:   pCO2 arterial 72.7 (*)    pO2, Arterial 73.4 (*)    Bicarbonate 41.6 (*)    Acid-Base Excess 19.4 (*)    All other components within normal limits  SARS CORONAVIRUS 2 (TAT 6-24 HRS)  POC SARS CORONAVIRUS 2 AG  POC SARS CORONAVIRUS 2 AG  TROPONIN I (HIGH SENSITIVITY)    EKG None  Radiology Dg  Chest Port 1 View  Result Date: 10/12/2019 CLINICAL DATA:  Shortness of breath. EXAM: PORTABLE CHEST 1 VIEW COMPARISON:  April 30, 2019. FINDINGS: The heart size and mediastinal contours are within normal limits. No pneumothorax or pleural effusion is noted. Bibasilar interstitial densities are noted concerning for pulmonary edema. The visualized skeletal structures are unremarkable. IMPRESSION: Findings and concerning for bilateral pulmonary edema. Electronically Signed   By: Lupita Raider M.D.   On: 10/12/2019 09:52    Procedures Procedures (including critical care time)  Medications Ordered in ED Medications  ipratropium-albuterol (DUONEB) 0.5-2.5 (3) MG/3ML nebulizer solution 3 mL (has no administration in time range)  albuterol (PROVENTIL) (2.5 MG/3ML) 0.083% nebulizer solution 2.5 mg (has no administration in time range)  methylPREDNISolone sodium succinate (SOLU-MEDROL) 125 mg/2 mL injection 125 mg (125 mg Intravenous Given 10/12/19 0934)  albuterol (VENTOLIN HFA) 108 (90 Base) MCG/ACT inhaler 6 puff (6 puffs Inhalation Given 10/12/19 0934)  furosemide (LASIX) injection 80 mg (80 mg Intravenous Given 10/12/19 1018)     Initial Impression / Assessment and Plan / ED Course  I have reviewed the triage vital signs and the nursing notes.  Pertinent labs & imaging  results that were available during my care of the patient were reviewed by me and considered in my medical decision making (see chart for details).        Pt with sob and confusion,  acute COPD exacerbation with CXR also suggesting chf.  Elevated bnp which is chronic.  ABG with elevation in Co2 of 72.7. No acute respiratory distress at this time while maintaining oxygen 4L [place].  He was given solumedrol and lasix IV , also given albuterol mdi.  Now that POC covid test negative, also have ordered albuterol/atrovent neb tx.  Pt will require admission for hypoxia, hypercapnia, COPD exacerbation.  Discussed with Dr. Manuella Ghazi who will  see and admit pt.  Will go to step down with bipap to improve Co2. Order placed here.  Pt was seen by Dr Roderic Palau during this ed visit.  CRITICAL CARE Performed by: Evalee Jefferson Total critical care time: 35 minutes Critical care time was exclusive of separately billable procedures and treating other patients. Critical care was necessary to treat or prevent imminent or life-threatening deterioration. Critical care was time spent personally by me on the following activities: development of treatment plan with patient and/or surrogate as well as nursing, discussions with consultants, evaluation of patient's response to treatment, examination of patient, obtaining history from patient or surrogate, ordering and performing treatments and interventions, ordering and review of laboratory studies, ordering and review of radiographic studies, pulse oximetry and re-evaluation of patient's condition.   Final Clinical Impressions(s) / ED Diagnoses   Final diagnoses:  COPD exacerbation ([place])  Hypoxia  SOB (shortness of breath)  Hypercapnia    ED Discharge Orders    None       Landis Martins 10/12/19 1117    Milton Ferguson, MD 10/12/19 1432

## 2019-10-12 NOTE — Progress Notes (Signed)
*  PRELIMINARY RESULTS* Echocardiogram 2D Echocardiogram has been performed.  Samuel Germany 10/12/2019, 2:57 PM

## 2019-10-13 LAB — BLOOD GAS, ARTERIAL
Acid-Base Excess: 18.2 mmol/L — ABNORMAL HIGH (ref 0.0–2.0)
Bicarbonate: 41.4 mmol/L — ABNORMAL HIGH (ref 20.0–28.0)
FIO2: 36
O2 Saturation: 99.3 %
Patient temperature: 36.6
pCO2 arterial: 52.6 mmHg — ABNORMAL HIGH (ref 32.0–48.0)
pH, Arterial: 7.522 — ABNORMAL HIGH (ref 7.350–7.450)
pO2, Arterial: 173 mmHg — ABNORMAL HIGH (ref 83.0–108.0)

## 2019-10-13 LAB — BASIC METABOLIC PANEL
Anion gap: 11 (ref 5–15)
BUN: 13 mg/dL (ref 6–20)
CO2: 41 mmol/L — ABNORMAL HIGH (ref 22–32)
Calcium: 8.6 mg/dL — ABNORMAL LOW (ref 8.9–10.3)
Chloride: 78 mmol/L — ABNORMAL LOW (ref 98–111)
Creatinine, Ser: 0.7 mg/dL (ref 0.61–1.24)
GFR calc Af Amer: >60 mL/min (ref >60)
GFR calc non Af Amer: >60 mL/min (ref >60)
Glucose, Bld: 106 mg/dL — ABNORMAL HIGH (ref 70–99)
Potassium: 3.7 mmol/L (ref 3.5–5.1)
Sodium: 130 mmol/L — ABNORMAL LOW (ref 135–145)

## 2019-10-13 LAB — BLOOD GAS, VENOUS
Acid-Base Excess: 23.1 mmol/L — ABNORMAL HIGH (ref 0.0–2.0)
Bicarbonate: 45.6 mmol/L — ABNORMAL HIGH (ref 20.0–28.0)
FIO2: 36
O2 Saturation: 81.8 %
Patient temperature: 37
pCO2, Ven: 73.1 mmHg (ref 44.0–60.0)
pH, Ven: 7.443 — ABNORMAL HIGH (ref 7.250–7.430)
pO2, Ven: 47.9 mmHg — ABNORMAL HIGH (ref 32.0–45.0)

## 2019-10-13 LAB — CBC
HCT: 31.8 % — ABNORMAL LOW (ref 39.0–52.0)
Hemoglobin: 10.3 g/dL — ABNORMAL LOW (ref 13.0–17.0)
MCH: 27.8 pg (ref 26.0–34.0)
MCHC: 32.4 g/dL (ref 30.0–36.0)
MCV: 85.7 fL (ref 80.0–100.0)
Platelets: 192 10*3/uL (ref 150–400)
RBC: 3.71 MIL/uL — ABNORMAL LOW (ref 4.22–5.81)
RDW: 13.1 % (ref 11.5–15.5)
WBC: 4.8 10*3/uL (ref 4.0–10.5)
nRBC: 0 % (ref 0.0–0.2)

## 2019-10-13 LAB — MAGNESIUM: Magnesium: 1.9 mg/dL (ref 1.7–2.4)

## 2019-10-13 LAB — MRSA PCR SCREENING: MRSA by PCR: NEGATIVE

## 2019-10-13 MED ORDER — DEXMEDETOMIDINE HCL IN NACL 400 MCG/100ML IV SOLN
0.4000 ug/kg/h | INTRAVENOUS | Status: DC
  Administered 2019-10-13: 23:00:00 0.799 ug/kg/h via INTRAVENOUS
  Administered 2019-10-13: 15:00:00 0.4 ug/kg/h via INTRAVENOUS
  Administered 2019-10-14 (×2): 1.2 ug/kg/h via INTRAVENOUS
  Filled 2019-10-13 (×5): qty 100

## 2019-10-13 MED ORDER — FUROSEMIDE 40 MG PO TABS
40.0000 mg | ORAL_TABLET | Freq: Every day | ORAL | Status: DC
  Administered 2019-10-13 – 2019-10-16 (×3): 40 mg via ORAL
  Filled 2019-10-13 (×3): qty 1

## 2019-10-13 MED ORDER — BISACODYL 10 MG RE SUPP
10.0000 mg | Freq: Once | RECTAL | Status: AC
  Administered 2019-10-13: 15:00:00 10 mg via RECTAL
  Filled 2019-10-13: qty 1

## 2019-10-13 NOTE — Progress Notes (Signed)
Patient attempting to get out of bed. Pulling at leads. Broke his LL lead. Safety mitts are in place but are only a small deterrent. IV ativan 1mg  prn given at this time.

## 2019-10-13 NOTE — Progress Notes (Signed)
Pt refusing to wear BiPAP mask. Explained to the patient the results of his blood gas and the benefits to wearing the BiPAP. He is still refusing. Pt is currently on 4lpm [place] which is chronic.

## 2019-10-13 NOTE — Progress Notes (Addendum)
Patient has been off and on BiPAP during day. Which because of confusion he has pulled off. He is on 4 liters oxygen and Saturation is 100 ( he wears 4 at home) Will decrease oxygen to 2 He  Is retaining some. PCO2 52.6 but not sure if this was on BiPAP. He is wheezing. On entry in ER it was reported he had not slept in days. He does have some anxiety and Bipolar disorders. He also has been given sedation because of trying to get out of bed numerous times.

## 2019-10-13 NOTE — Progress Notes (Signed)
Saturation down to 94 on 2 liters. Pulse ox is connected to foot

## 2019-10-13 NOTE — Progress Notes (Signed)
Despite multiple PRN medications given, patient has continually pulled his cpap off. Safety mitts were placed and were ineffective. Patient has gotten out of bed while connected to cords and his oxygen saturations decreased. He has still been very restless and confused. He is difficult to reorient and get back in the bed. Dr. Manuella Ghazi is aware. An ABG was drawn and patient was placed on 4L [place]. A precedex drip was started on the patient, per order, and currently patient is resting comfortably in the bed. His oyxgen saturations are 100%. Will continue to monitor patient.

## 2019-10-13 NOTE — Progress Notes (Signed)
PROGRESS NOTE    Jeffrey [name]  HQI:[id number] DOB: [birth year] DOA: 10/12/2019 PCP: Gareth Morgan, MD   Brief Narrative:  Per HPI: Jeffrey [name] is a 58 y.o. male with medical history significant for former tobacco abuse with history of COPD and chronic hypoxemia on 4 L nasal cannula, former alcoholic, diastolic congestive heart failure-grade 2, hypertension, dyslipidemia, peripheral vascular disease, depression/anxiety, and bipolar disorder who presented to the emergency department who has been having gradual worsening of shortness of breath over the last 1 month with noted orthopnea as well as weight gain of approximately 5 pounds.  He has been taking his Lasix 40 mg daily at home otherwise.  He has had an acute change in the last 24 to 48 hours, however with some chest tightness and wheezing.  His wife had also noted that he is a little bit more confused and lethargic.  He denies any lower extremity edema and does have a mild cough that is nonproductive.  He has been taking his home breathing treatments with minimal improvement in his symptoms.  11/20: Patient was admitted with acute hypercapnic respiratory failure secondary to acute on chronic COPD exacerbation as well as component of CHF.  2D echocardiogram reviewed with stable LVEF at 65-70% and grade 1 diastolic dysfunction noted.  He refuses to wear BiPAP overnight and his PCO2 continues to remain elevated.  He continues to remain somewhat agitated this morning.  Assessment & Plan:   Active Problems:   COPD with acute exacerbation (HCC)   Acute hypercapnic respiratory failure secondary to acute on chronic COPD exacerbation -Avoid further BiPAP and try nasal CPAP as discussed with respiratory therapy -Try to get out of bed into chair if possible -Continue on Solu-Medrol as well as Pulmicort along with breathing treatments -No significant cough, fevers or chills noted -Continue Mucinex -Maintain on IV PPI daily while on steroids  due to history of peptic ulcer disease  Acute on chronic diastolic heart failure decompensation -Lasix 40 mg IV twice daily we will change back to oral dosing today due to some renal intolerance -Monitor I's and O's with net negative balance of 1800 mL -Last 2D echocardiogram on 12/19/2018 with LVEF 65-70% with grade 2 diastolic dysfunction -2D echocardiogram with LVEF 65-70% and grade 1 diastolic dysfunction.  Appears stable compared to prior.  Hypertension -Continue Cardizem and metoprolol  Bipolar disorder-stable -Continue on Depakote, Prozac, Zyprexa, and trazodone -Noted to have some delirium and agitation overnight -We will continue on Ativan as needed as well as Haldol -We will discuss with wife regarding any potential withdrawal symptoms  History of alcohol and tobacco abuse -Patient states that he is remaining abstinent  History of PAD -Continue aspirin and Lipitor   DVT prophylaxis: Lovenox Code Status: Full Family Communication: None at bedside, will plan to call wife Disposition Plan: Continue treatment for COPD as well as CHF exacerbation.  Continues to have hypercapnia.  Work on component of delirium and agitation.  Will need ongoing stepdown unit monitoring.   Consultants:   None  Procedures:   None  Antimicrobials:   None   Subjective: Patient seen and evaluated today with ongoing confusion and agitation noted while receiving breathing treatment.  He refused to wear BiPAP overnight and became quite agitated and continues to have elevated PCO2 levels.  Objective: Vitals:   10/13/19 0200 10/13/19 0400 10/13/19 0500 10/13/19 0600  BP: (!) 81/49 (!) 87/56 (!) 90/58 99/60  Pulse: 75 73 73 74  Resp:  Temp:  98 F (36.7 C)    TempSrc:  Oral    SpO2: 99% 100% 97% 97%  Weight:   72.6 kg   Height:        Intake/Output Summary (Last 24 hours) at 10/13/2019 0733 Last data filed at 10/12/2019 1551 Gross per 24 hour  Intake --  Output 1800 ml   Net -1800 ml   Filed Weights   10/12/19 0847 10/12/19 1610 10/13/19 0500  Weight: 72.6 kg 72 kg 72.6 kg    Examination:  General exam: Appears minimally agitated Respiratory system: Respiratory effort normal.  Currently on breathing treatment with minimal wheezing noted bilaterally. Cardiovascular system: S1 & S2 heard, RRR. No JVD, murmurs, rubs, gallops or clicks. No pedal edema. Gastrointestinal system: Abdomen is nondistended, soft and nontender. No organomegaly or masses felt. Normal bowel sounds heard. Central nervous system: Mildly agitated Extremities: Symmetric 5 x 5 power.  No edema Skin: No rashes, lesions or ulcers Psychiatry: Difficult to assess.    Data Reviewed: I have personally reviewed following labs and imaging studies  CBC: Recent Labs  Lab 10/12/19 0930 10/13/19 0346  WBC 6.8 4.8  NEUTROABS 5.5  --   HGB 11.0* 10.3*  HCT 34.6* 31.8*  MCV 87.6 85.7  PLT 189 192   Basic Metabolic Panel: Recent Labs  Lab 10/12/19 0930 10/13/19 0346  NA 131* 130*  K 4.1 3.7  CL 75* 78*  CO2 45* 41*  GLUCOSE 105* 106*  BUN 5* 13  CREATININE 0.52* 0.70  CALCIUM 9.1 8.6*  MG  --  1.9   GFR: Estimated Creatinine Clearance: 102.1 mL/min (by C-G formula based on SCr of 0.7 mg/dL). Liver Function Tests: No results for input(s): AST, ALT, ALKPHOS, BILITOT, PROT, ALBUMIN in the last 168 hours. No results for input(s): LIPASE, AMYLASE in the last 168 hours. No results for input(s): AMMONIA in the last 168 hours. Coagulation Profile: No results for input(s): INR, PROTIME in the last 168 hours. Cardiac Enzymes: No results for input(s): CKTOTAL, CKMB, CKMBINDEX, TROPONINI in the last 168 hours. BNP (last 3 results) No results for input(s): PROBNP in the last 8760 hours. HbA1C: No results for input(s): HGBA1C in the last 72 hours. CBG: No results for input(s): GLUCAP in the last 168 hours. Lipid Profile: No results for input(s): CHOL, HDL, LDLCALC, TRIG, CHOLHDL,  LDLDIRECT in the last 72 hours. Thyroid Function Tests: No results for input(s): TSH, T4TOTAL, FREET4, T3FREE, THYROIDAB in the last 72 hours. Anemia Panel: No results for input(s): VITAMINB12, FOLATE, FERRITIN, TIBC, IRON, RETICCTPCT in the last 72 hours. Sepsis Labs: No results for input(s): PROCALCITON, LATICACIDVEN in the last 168 hours.  Recent Results (from the past 240 hour(s))  SARS Coronavirus 2 by RT PCR (hospital order, performed in [facility] hospital lab) Nasopharyngeal Nasopharyngeal Swab     Status: None   Collection Time: 10/12/19 11:39 AM   Specimen: Nasopharyngeal Swab  Result Value Ref Range Status   SARS Coronavirus 2 NEGATIVE NEGATIVE Final    Comment: (NOTE) If result is NEGATIVE SARS-CoV-2 target nucleic acids are NOT DETECTED. The SARS-CoV-2 RNA is generally detectable in upper and lower  respiratory specimens during the acute phase of infection. The lowest  concentration of SARS-CoV-2 viral copies this assay can detect is 250  copies / mL. A negative result does not preclude SARS-CoV-2 infection  and should not be used as the sole basis for treatment or other  patient management decisions.  A negative result may occur with  improper specimen collection / handling, submission of specimen other  than nasopharyngeal swab, presence of viral mutation(s) within the  areas targeted by this assay, and inadequate number of viral copies  (<250 copies / mL). A negative result must be combined with clinical  observations, patient history, and epidemiological information. If result is POSITIVE SARS-CoV-2 target nucleic acids are DETECTED. The SARS-CoV-2 RNA is generally detectable in upper and lower  respiratory specimens dur ing the acute phase of infection.  Positive  results are indicative of active infection with SARS-CoV-2.  Clinical  correlation with patient history and other diagnostic information is  necessary to determine patient infection status.  Positive  results do  not rule out bacterial infection or co-infection with other viruses. If result is PRESUMPTIVE POSTIVE SARS-CoV-2 nucleic acids MAY BE PRESENT.   A presumptive positive result was obtained on the submitted specimen  and confirmed on repeat testing.  While 2019 novel coronavirus  (SARS-CoV-2) nucleic acids may be present in the submitted sample  additional confirmatory testing may be necessary for epidemiological  and / or clinical management purposes  to differentiate between  SARS-CoV-2 and other Sarbecovirus currently known to infect humans.  If clinically indicated additional testing with an alternate test  methodology [phone]) is advised. The SARS-CoV-2 RNA is generally  detectable in upper and lower respiratory sp ecimens during the acute  phase of infection. The expected result is Negative. Fact Sheet for Patients:  BoilerBrush.com.cy Fact Sheet for Healthcare Providers: https://pope.com/ This test is not yet approved or cleared by the [place] FDA and has been authorized for detection and/or diagnosis of SARS-CoV-2 by FDA under an Emergency Use Authorization (EUA).  This EUA will remain in effect (meaning this test can be used) for the duration of the COVID-19 declaration under Section 564(b)(1) of the Act, 21 U.S.C. section 360bbb-3(b)(1), unless the authorization is terminated or revoked sooner. Performed at [facility], [address], [place], [place] [address]   MRSA PCR Screening     Status: None   Collection Time: 10/12/19  3:00 PM   Specimen: Nasal Mucosa; Nasopharyngeal  Result Value Ref Range Status   MRSA by PCR NEGATIVE NEGATIVE Final    Comment:        The GeneXpert MRSA Assay (FDA approved for NASAL specimens only), is one component of a comprehensive MRSA colonization surveillance program. It is not intended to diagnose MRSA infection nor to guide or monitor treatment for MRSA  infections. Performed at [facility], [address], [place], [place] [address]          Radiology Studies: Dg Chest [facility] 1 View  Result Date: 10/12/2019 CLINICAL DATA:  Shortness of breath. EXAM: PORTABLE CHEST 1 VIEW COMPARISON:  April 30, 2019. FINDINGS: The heart size and mediastinal contours are within normal limits. No pneumothorax or pleural effusion is noted. Bibasilar interstitial densities are noted concerning for pulmonary edema. The visualized skeletal structures are unremarkable. IMPRESSION: Findings and concerning for bilateral pulmonary edema. Electronically Signed   By: Lupita Raider M.D.   On: 10/12/2019 09:52        Scheduled Meds:  aspirin EC  81 mg Oral QPM   atorvastatin  20 mg Oral QPM   budesonide (PULMICORT) nebulizer solution  0.25 mg Nebulization BID   Chlorhexidine Gluconate Cloth  6 each Topical Q0600   diltiazem  180 mg Oral Daily   divalproex  1,500 mg Oral QHS   enoxaparin (LOVENOX) injection  40 mg Subcutaneous Q24H  feeding supplement (ENSURE ENLIVE)  237 mL Oral BID BM   FLUoxetine  40 mg Oral Daily   furosemide  40 mg Intravenous Q12H   gabapentin  400 mg Oral TID   guaiFENesin  600 mg Oral BID   ipratropium-albuterol  3 mL Nebulization Q6H   methocarbamol  500 mg Oral TID   methylPREDNISolone (SOLU-MEDROL) injection  40 mg Intravenous Q12H   metoprolol tartrate  25 mg Oral BID   multivitamin with minerals  1 tablet Oral Daily   OLANZapine  15 mg Oral QHS   pantoprazole (PROTONIX) IV  40 mg Intravenous Q24H   potassium chloride SA  20 mEq Oral Daily   sodium chloride flush  3 mL Intravenous Q12H   traZODone  100 mg Oral QHS   Continuous Infusions:  sodium chloride       LOS: 1 day    Time spent: 30 minutes    Marina Boerner Hoover Brunette, DO [facility] Pager [phone]  If 7PM-7AM, please contact night-coverage [email or web address] Password [facility] 10/13/2019, 7:33 AM

## 2019-10-13 NOTE — Progress Notes (Signed)
Lab called critical value of PCO2 of 73.1 at 0402 Has increased slightly over previous. MD made aware via text page at [place].

## 2019-10-14 LAB — CBC
HCT: 37.1 % — ABNORMAL LOW (ref 39.0–52.0)
Hemoglobin: 12.2 g/dL — ABNORMAL LOW (ref 13.0–17.0)
MCH: 27.3 pg (ref 26.0–34.0)
MCHC: 32.9 g/dL (ref 30.0–36.0)
MCV: 83 fL (ref 80.0–100.0)
Platelets: 221 10*3/uL (ref 150–400)
RBC: 4.47 MIL/uL (ref 4.22–5.81)
RDW: 12.7 % (ref 11.5–15.5)
WBC: 5.4 10*3/uL (ref 4.0–10.5)
nRBC: 0 % (ref 0.0–0.2)

## 2019-10-14 LAB — BASIC METABOLIC PANEL
Anion gap: 17 — ABNORMAL HIGH (ref 5–15)
BUN: 23 mg/dL — ABNORMAL HIGH (ref 6–20)
CO2: 37 mmol/L — ABNORMAL HIGH (ref 22–32)
Calcium: 9.6 mg/dL (ref 8.9–10.3)
Chloride: 80 mmol/L — ABNORMAL LOW (ref 98–111)
Creatinine, Ser: 0.57 mg/dL — ABNORMAL LOW (ref 0.61–1.24)
GFR calc Af Amer: >60 mL/min (ref >60)
GFR calc non Af Amer: >60 mL/min (ref >60)
Glucose, Bld: 146 mg/dL — ABNORMAL HIGH (ref 70–99)
Potassium: 3.7 mmol/L (ref 3.5–5.1)
Sodium: 134 mmol/L — ABNORMAL LOW (ref 135–145)

## 2019-10-14 LAB — MAGNESIUM: Magnesium: 2.2 mg/dL (ref 1.7–2.4)

## 2019-10-14 MED ORDER — ADULT MULTIVITAMIN W/MINERALS CH: 1.0000 | ORAL_TABLET | Freq: Every day | ORAL | Status: DC

## 2019-10-14 MED ORDER — FOLIC ACID 1 MG PO TABS
1.0000 mg | ORAL_TABLET | Freq: Every day | ORAL | Status: DC
  Administered 2019-10-15 – 2019-10-16 (×2): 1 mg via ORAL
  Filled 2019-10-14 (×2): qty 1

## 2019-10-14 MED ORDER — VITAMIN B-1 100 MG PO TABS
100.0000 mg | ORAL_TABLET | Freq: Every day | ORAL | Status: DC
  Administered 2019-10-15 – 2019-10-16 (×2): 100 mg via ORAL
  Filled 2019-10-14 (×2): qty 1

## 2019-10-14 MED ORDER — PHENOL 1.4 % MT LIQD: 1.0000 | OROMUCOSAL | Status: DC | PRN

## 2019-10-14 NOTE — Progress Notes (Addendum)
Patient given neb early for cough. He is still has slight confusion but seems better or improved. Cough is congested with some sputum. Lung sounds decreased.

## 2019-10-14 NOTE — Progress Notes (Signed)
PROGRESS NOTE    Jeffrey [name]  YQM:[id number] DOB: March 07, 1961 DOA: 10/12/2019 PCP: Gareth Morgan, MD   Brief Narrative:  Per HPI: Jeffrey [name] a 58 y.o.malewith medical history significant forformer tobacco abuse with history of COPD and chronic hypoxemia on 4 L nasal cannula, former alcoholic, diastolic congestive heart failure-grade 2, hypertension, dyslipidemia, peripheral vascular disease, depression/anxiety, and bipolar disorder who presented to the emergency department who has been having gradual worsening of shortness of breath over the last 1 month with noted orthopnea as well as weight gain of approximately 5 pounds. He has been taking his Lasix 40 mg daily at home otherwise. He has had an acute change in the last 24 to 48 hours, however with some chest tightness and wheezing. His wife had also noted that he is a little bit more confused and lethargic. He denies any lower extremity edema and does have a mild cough that is nonproductive. He has been taking his home breathing treatments with minimal improvement in his symptoms.  11/20: Patient was admitted with acute hypercapnic respiratory failure secondary to acute on chronic COPD exacerbation as well as component of CHF.  2D echocardiogram reviewed with stable LVEF at 65-70% and grade 1 diastolic dysfunction noted.  He refuses to wear BiPAP overnight and his PCO2 continues to remain elevated.  He continues to remain somewhat agitated this morning.  11/21: Patient continued to have intermittent agitation throughout the evening and Precedex drip was started yesterday afternoon.  He continues to have periods of agitation signaling potential withdrawal symptoms.  His PCO2 had improved on ABG yesterday and he has stayed off BiPAP.  We will recheck this morning.  We will plan to check urine toxicology as he has had prior cocaine and THC use noted.  Assessment & Plan:   Active Problems:   COPD with acute exacerbation (HCC)   Acute encephalopathy likely secondary to potential withdrawal symptoms -Ex-wife had stated that patient has been free of alcohol use, however daughter is concerned that there may be still ongoing alcohol use and patient could be withdrawing -CIWA protocol with thiamine supplementation -Continue close monitoring in ICU with Precedex drip and wean as tolerated -Check urine toxicology with prior noted cocaine and marijuana use  Acute hypercapnic respiratory failure secondary to acute on chronic COPD exacerbation -He is still noted to have ongoing wheezing -Currently on nasal cannula oxygen and will not tolerate noninvasive ventilation on account of agitation -Try to get out of bed into chair if possible -Continue on Solu-Medrol as well as Pulmicort along with breathing treatments -No significant cough, fevers or chills noted -Continue Mucinex -Maintain on IV PPI daily while on steroids due to history of peptic ulcer disease  Acute on chronic diastolic heart failure decompensation -Lasix 40 mg IV twice daily we will change back to oral dosing today due to some renal intolerance -Monitor I's and O's with net negative balance of approximately 3400 mL so far -Last 2D echocardiogram on 12/19/2018 with LVEF 65-70% with grade 2 diastolic dysfunction -2D echocardiogram with LVEF 65-70% and grade 1 diastolic dysfunction.  Appears stable compared to prior.  Hypertension -Hold Cardizem and metoprolol today due to some bradycardia noted on telemetry  Bipolar disorder -Continue on Depakote, Prozac, Zyprexa, and trazodone -Noted to have some delirium and agitation overnight  History of alcohol and tobacco abuse -Patient states that he is remaining abstinent, however separate history from daughter reveals potential ongoing alcohol use  History of PAD -Continue aspirin and Lipitor   DVT  prophylaxis: Lovenox Code Status: Full Family Communication: None at bedside, will plan to call wife and  daughter today Disposition Plan: Continue treatment for COPD as well as CHF exacerbation.  Recheck venous blood gas today.  Continue on DT precautions.   Consultants:   None  Procedures:   None  Antimicrobials:   None   Subjective: Patient seen and evaluated today with ongoing, intermittent agitation throughout the evening requiring Precedex drip.  He is currently on nasal cannula oxygen with no signs of respiratory distress.  Objective: Vitals:   10/14/19 0017 10/14/19 0100 10/14/19 0402 10/14/19 0500  BP:  121/72    Pulse:  (!) 52    Resp:  14    Temp: (!) 97.3 F (36.3 C)     TempSrc: Axillary     SpO2:  97% 97%   Weight:    68.4 kg  Height:        Intake/Output Summary (Last 24 hours) at 10/14/2019 0717 Last data filed at 10/14/2019 0319 Gross per 24 hour  Intake 118.4 ml  Output 1800 ml  Net -1681.6 ml   Filed Weights   10/12/19 1610 10/13/19 0500 10/14/19 0500  Weight: 72 kg 72.6 kg 68.4 kg    Examination:  General exam: Appears sedated Respiratory system: Minimal wheezing noted bilaterally. Respiratory effort normal.  Currently on 2 L nasal cannula oxygen Cardiovascular system: S1 & S2 heard, RRR. No JVD, murmurs, rubs, gallops or clicks. No pedal edema. Gastrointestinal system: Abdomen is nondistended, soft and nontender. No organomegaly or masses felt. Normal bowel sounds heard. Central nervous system: Somewhat sedated on Precedex Extremities: No edema Skin: No rashes, lesions or ulcers Psychiatry: Cannot be assessed given patient condition    Data Reviewed: I have personally reviewed following labs and imaging studies  CBC: Recent Labs  Lab 10/12/19 0930 10/13/19 0346 10/14/19 0416  WBC 6.8 4.8 5.4  NEUTROABS 5.5  --   --   HGB 11.0* 10.3* 12.2*  HCT 34.6* 31.8* 37.1*  MCV 87.6 85.7 83.0  PLT 189 192 221   Basic Metabolic Panel: Recent Labs  Lab 10/12/19 0930 10/13/19 0346 10/14/19 0416  NA 131* 130* 134*  K 4.1 3.7 3.7   CL 75* 78* 80*  CO2 45* 41* 37*  GLUCOSE 105* 106* 146*  BUN 5* 13 23*  CREATININE 0.52* 0.70 0.57*  CALCIUM 9.1 8.6* 9.6  MG  --  1.9 2.2   GFR: Estimated Creatinine Clearance: 96.2 mL/min (A) (by C-G formula based on SCr of 0.57 mg/dL (L)). Liver Function Tests: No results for input(s): AST, ALT, ALKPHOS, BILITOT, PROT, ALBUMIN in the last 168 hours. No results for input(s): LIPASE, AMYLASE in the last 168 hours. No results for input(s): AMMONIA in the last 168 hours. Coagulation Profile: No results for input(s): INR, PROTIME in the last 168 hours. Cardiac Enzymes: No results for input(s): CKTOTAL, CKMB, CKMBINDEX, TROPONINI in the last 168 hours. BNP (last 3 results) No results for input(s): PROBNP in the last 8760 hours. HbA1C: No results for input(s): HGBA1C in the last 72 hours. CBG: No results for input(s): GLUCAP in the last 168 hours. Lipid Profile: No results for input(s): CHOL, HDL, LDLCALC, TRIG, CHOLHDL, LDLDIRECT in the last 72 hours. Thyroid Function Tests: No results for input(s): TSH, T4TOTAL, FREET4, T3FREE, THYROIDAB in the last 72 hours. Anemia Panel: No results for input(s): VITAMINB12, FOLATE, FERRITIN, TIBC, IRON, RETICCTPCT in the last 72 hours. Sepsis Labs: No results for input(s): PROCALCITON, LATICACIDVEN in the last 168  hours.  Recent Results (from the past 240 hour(s))  SARS Coronavirus 2 by RT PCR (hospital order, performed in [facility] hospital lab) Nasopharyngeal Nasopharyngeal Swab     Status: None   Collection Time: 10/12/19 11:39 AM   Specimen: Nasopharyngeal Swab  Result Value Ref Range Status   SARS Coronavirus 2 NEGATIVE NEGATIVE Final    Comment: (NOTE) If result is NEGATIVE SARS-CoV-2 target nucleic acids are NOT DETECTED. The SARS-CoV-2 RNA is generally detectable in upper and lower  respiratory specimens during the acute phase of infection. The lowest  concentration of SARS-CoV-2 viral copies this assay can detect is 250   copies / mL. A negative result does not preclude SARS-CoV-2 infection  and should not be used as the sole basis for treatment or other  patient management decisions.  A negative result may occur with  improper specimen collection / handling, submission of specimen other  than nasopharyngeal swab, presence of viral mutation(s) within the  areas targeted by this assay, and inadequate number of viral copies  (<250 copies / mL). A negative result must be combined with clinical  observations, patient history, and epidemiological information. If result is POSITIVE SARS-CoV-2 target nucleic acids are DETECTED. The SARS-CoV-2 RNA is generally detectable in upper and lower  respiratory specimens dur ing the acute phase of infection.  Positive  results are indicative of active infection with SARS-CoV-2.  Clinical  correlation with patient history and other diagnostic information is  necessary to determine patient infection status.  Positive results do  not rule out bacterial infection or co-infection with other viruses. If result is PRESUMPTIVE POSTIVE SARS-CoV-2 nucleic acids MAY BE PRESENT.   A presumptive positive result was obtained on the submitted specimen  and confirmed on repeat testing.  While 2019 novel coronavirus  (SARS-CoV-2) nucleic acids may be present in the submitted sample  additional confirmatory testing may be necessary for epidemiological  and / or clinical management purposes  to differentiate between  SARS-CoV-2 and other Sarbecovirus currently known to infect humans.  If clinically indicated additional testing with an alternate test  methodology [phone]) is advised. The SARS-CoV-2 RNA is generally  detectable in upper and lower respiratory sp ecimens during the acute  phase of infection. The expected result is Negative. Fact Sheet for Patients:  BoilerBrush.com.cy Fact Sheet for Healthcare Providers: https://pope.com/  This test is not yet approved or cleared by the [place] FDA and has been authorized for detection and/or diagnosis of SARS-CoV-2 by FDA under an Emergency Use Authorization (EUA).  This EUA will remain in effect (meaning this test can be used) for the duration of the COVID-19 declaration under Section 564(b)(1) of the Act, 21 U.S.C. section 360bbb-3(b)(1), unless the authorization is terminated or revoked sooner. Performed at [facility], [address], [place], [place] [address]   MRSA PCR Screening     Status: None   Collection Time: 10/12/19  3:00 PM   Specimen: Nasal Mucosa; Nasopharyngeal  Result Value Ref Range Status   MRSA by PCR NEGATIVE NEGATIVE Final    Comment:        The GeneXpert MRSA Assay (FDA approved for NASAL specimens only), is one component of a comprehensive MRSA colonization surveillance program. It is not intended to diagnose MRSA infection nor to guide or monitor treatment for MRSA infections. Performed at [facility], [address], [place], [place] [address]          Radiology Studies: Dg Chest [facility] 1 View  Result Date: 10/12/2019  CLINICAL DATA:  Shortness of breath. EXAM: PORTABLE CHEST 1 VIEW COMPARISON:  April 30, 2019. FINDINGS: The heart size and mediastinal contours are within normal limits. No pneumothorax or pleural effusion is noted. Bibasilar interstitial densities are noted concerning for pulmonary edema. The visualized skeletal structures are unremarkable. IMPRESSION: Findings and concerning for bilateral pulmonary edema. Electronically Signed   By: Lupita Raider M.D.   On: 10/12/2019 09:52        Scheduled Meds: . aspirin EC  81 mg Oral QPM  . atorvastatin  20 mg Oral QPM  . budesonide (PULMICORT) nebulizer solution  0.25 mg Nebulization BID  . Chlorhexidine Gluconate Cloth  6 each Topical Q0600  . diltiazem  180 mg Oral Daily  . divalproex  1,500 mg Oral QHS  . enoxaparin (LOVENOX) injection  40 mg Subcutaneous Q24H   . feeding supplement (ENSURE ENLIVE)  237 mL Oral BID BM  . FLUoxetine  40 mg Oral Daily  . furosemide  40 mg Oral Daily  . gabapentin  400 mg Oral TID  . guaiFENesin  600 mg Oral BID  . ipratropium-albuterol  3 mL Nebulization Q6H  . methocarbamol  500 mg Oral TID  . methylPREDNISolone (SOLU-MEDROL) injection  40 mg Intravenous Q12H  . metoprolol tartrate  25 mg Oral BID  . multivitamin with minerals  1 tablet Oral Daily  . OLANZapine  15 mg Oral QHS  . pantoprazole (PROTONIX) IV  40 mg Intravenous Q24H  . potassium chloride SA  20 mEq Oral Daily  . sodium chloride flush  3 mL Intravenous Q12H  . traZODone  100 mg Oral QHS   Continuous Infusions: . sodium chloride    . dexmedetomidine (PRECEDEX) IV infusion 1 mcg/kg/hr (10/14/19 0100)     LOS: 2 days    Time spent: 30 minutes    Gunda Maqueda Hoover Brunette, DO [facility] Pager [phone]  If 7PM-7AM, please contact night-coverage [email or web address] Password [facility] 10/14/2019, 7:17 AM

## 2019-10-14 NOTE — Progress Notes (Signed)
Patient continually trying to get up out of bed despite medication and numerous attempts at re-orientation.

## 2019-10-14 NOTE — Progress Notes (Addendum)
Adjusted oxygen down or up for oxygen saturation. Patient has hx of alcohol and Drug use as late as 12/16/18 Cocaine. States he is not using drug or ETOH on this visit. No Toix screen was done in er this visit. Not sure if all this confusion is related???

## 2019-10-14 NOTE — Progress Notes (Signed)
Patient woke up went back to sleep still a little confused.

## 2019-10-15 ENCOUNTER — Inpatient Hospital Stay (HOSPITAL_COMMUNITY): Payer: Medicare Other

## 2019-10-15 LAB — BLOOD GAS, VENOUS
Acid-Base Excess: 16.1 mmol/L — ABNORMAL HIGH (ref 0.0–2.0)
Bicarbonate: 36.9 mmol/L — ABNORMAL HIGH (ref 20.0–28.0)
FIO2: 36
O2 Saturation: 54.5 %
Patient temperature: 36.9
pCO2, Ven: 61.3 mmHg — ABNORMAL HIGH (ref 44.0–60.0)
pH, Ven: 7.443 — ABNORMAL HIGH (ref 7.250–7.430)
pO2, Ven: 32.2 mmHg (ref 32.0–45.0)

## 2019-10-15 LAB — BASIC METABOLIC PANEL
Anion gap: 12 (ref 5–15)
BUN: 20 mg/dL (ref 6–20)
CO2: 38 mmol/L — ABNORMAL HIGH (ref 22–32)
Calcium: 9 mg/dL (ref 8.9–10.3)
Chloride: 84 mmol/L — ABNORMAL LOW (ref 98–111)
Creatinine, Ser: 0.56 mg/dL — ABNORMAL LOW (ref 0.61–1.24)
GFR calc Af Amer: >60 mL/min (ref >60)
GFR calc non Af Amer: >60 mL/min (ref >60)
Glucose, Bld: 98 mg/dL (ref 70–99)
Potassium: 3.1 mmol/L — ABNORMAL LOW (ref 3.5–5.1)
Sodium: 134 mmol/L — ABNORMAL LOW (ref 135–145)

## 2019-10-15 LAB — CBC
HCT: 35.3 % — ABNORMAL LOW (ref 39.0–52.0)
Hemoglobin: 11.5 g/dL — ABNORMAL LOW (ref 13.0–17.0)
MCH: 27.2 pg (ref 26.0–34.0)
MCHC: 32.6 g/dL (ref 30.0–36.0)
MCV: 83.5 fL (ref 80.0–100.0)
Platelets: 222 10*3/uL (ref 150–400)
RBC: 4.23 MIL/uL (ref 4.22–5.81)
RDW: 13.2 % (ref 11.5–15.5)
WBC: 7.4 10*3/uL (ref 4.0–10.5)
nRBC: 0 % (ref 0.0–0.2)

## 2019-10-15 LAB — MAGNESIUM: Magnesium: 2 mg/dL (ref 1.7–2.4)

## 2019-10-15 MED ORDER — METOPROLOL TARTRATE 25 MG PO TABS
25.0000 mg | ORAL_TABLET | Freq: Two times a day (BID) | ORAL | Status: DC
  Administered 2019-10-15 – 2019-10-16 (×3): 25 mg via ORAL
  Filled 2019-10-15 (×3): qty 1

## 2019-10-15 MED ORDER — POTASSIUM CHLORIDE CRYS ER 20 MEQ PO TBCR
40.0000 meq | EXTENDED_RELEASE_TABLET | Freq: Once | ORAL | Status: AC
  Administered 2019-10-15: 09:00:00 40 meq via ORAL
  Filled 2019-10-15: qty 2

## 2019-10-15 MED ORDER — DILTIAZEM HCL ER COATED BEADS 180 MG PO CP24
180.0000 mg | ORAL_CAPSULE | Freq: Every day | ORAL | Status: DC
  Administered 2019-10-15 – 2019-10-16 (×2): 180 mg via ORAL
  Filled 2019-10-15 (×2): qty 1

## 2019-10-15 NOTE — Progress Notes (Signed)
Patient is better does have some confusion still at times but appears to be improving.

## 2019-10-15 NOTE — Progress Notes (Signed)
PROGRESS NOTE    Jeffrey [name]  UJW:[id number] DOB: [birth year] DOA: 10/12/2019 PCP: Gareth Morgan, MD   Brief Narrative:  Per HPI: Jeffrey [name] a 58 y.o.malewith medical history significant forformer tobacco abuse with history of COPD and chronic hypoxemia on 4 L nasal cannula, former alcoholic, diastolic congestive heart failure-grade 2, hypertension, dyslipidemia, peripheral vascular disease, depression/anxiety, and bipolar disorder who presented to the emergency department who has been having gradual worsening of shortness of breath over the last 1 month with noted orthopnea as well as weight gain of approximately 5 pounds. He has been taking his Lasix 40 mg daily at home otherwise. He has had an acute change in the last 24 to 48 hours, however with some chest tightness and wheezing. His wife had also noted that he is a little bit more confused and lethargic. He denies any lower extremity edema and does have a mild cough that is nonproductive. He has been taking his home breathing treatments with minimal improvement in his symptoms.  11/20: Patient was admitted with acute hypercapnic respiratory failure secondary to acute on chronic COPD exacerbation as well as component of CHF. 2D echocardiogram reviewed with stable LVEF at 65-70% and grade 1 diastolic dysfunction noted. He refuses to wear BiPAP overnight and his PCO2 continues to remain elevated. He continues to remain somewhat agitated this morning.  11/21: Patient continued to have intermittent agitation throughout the evening and Precedex drip was started yesterday afternoon.  He continues to have periods of agitation signaling potential withdrawal symptoms.  His PCO2 had improved on ABG yesterday and he has stayed off BiPAP.  We will recheck this morning.  We will plan to check urine toxicology as he has had prior cocaine and THC use noted.  11/22: Patient is doing much better this morning he is more lucid and has been  off his Precedex for at least over 12 hours.  He is stable for transfer to telemetry this morning.  He continues to remain on nasal cannula oxygen without any significant respiratory distress.  Assessment & Plan:   Active Problems:   COPD with acute exacerbation (HCC)   Acute encephalopathy likely secondary to potential withdrawal symptoms -Ex-wife had stated that patient has been free of alcohol use, however daughter is concerned that there may be still ongoing alcohol use and/or use of excessive benzodiazepines -CIWA protocol with thiamine supplementation -Okay for transfer to telemetry as patient has been off Precedex drip.  Use Ativan as needed. -Check urine toxicology with prior noted cocaine and marijuana use, this is still pending  Acute hypercapnic respiratory failure secondary to acute on chronic COPD exacerbation -No further wheezing noted this morning, but remains on nasal cannula oxygen -We will plan to check venous blood gas as well as chest x-ray today -Currently on nasal cannula oxygen and will not tolerate noninvasive ventilation on account of agitation -Try to get out of bed into chair if possible -Continue on Solu-Medrol as well as Pulmicort along with breathing treatments -No significant cough, fevers or chills noted -Continue Mucinex -Maintain on IV PPI daily while on steroids due to history of peptic ulcer disease  Acute on chronic diastolic heart failure decompensation -Lasix 40 mg IV twice dailywe will change back to oral dosing today due to some renal intolerance -Monitor I's and O'swith net negative balance of approximately 3400 mL so far -Last 2D echocardiogram on 12/19/2018 with LVEF 65-70% with grade 2 diastolic dysfunction -2D echocardiogram with LVEF 65-70% and grade 1 diastolic dysfunction. Appears  stable compared to prior.  Hypertension -Resume home Cardizem and metoprolol due to elevated heart rates this morning -Bradycardia yesterday likely  related to Precedex use  Hypokalemia -Replete and reevaluate in a.m.  Bipolar disorder -Continue on Depakote, Prozac, Zyprexa, and trazodone -Noted to have some delirium and agitation overnight  History of alcohol and tobacco abuse -Patient states that he is remaining abstinent, however separate history from daughter reveals potential ongoing alcohol use  History of PAD -Continue aspirin and Lipitor   DVT prophylaxis:Lovenox Code Status:Full Family Communication: Discussed with daughter on phone Disposition Plan:Continue treatment for COPD as well as CHF exacerbation.  Recheck venous blood gas today as well as chest x-ray.  Okay for transfer to telemetry.   Consultants:  None  Procedures:  None  Antimicrobials:   None  Subjective: Patient seen and evaluated today with much less agitation.  Patient has remained off Precedex.  He appears to be stable for transfer to telemetry.  Objective: Vitals:   10/15/19 0300 10/15/19 0357 10/15/19 0400 10/15/19 0500  BP: (!) 92/49  (!) 111/56 (!) 100/53  Pulse: 85  84 85  Resp: 15  20 17   Temp:      TempSrc:      SpO2: 98%  94% 97%  Weight:  68.4 kg    Height:        Intake/Output Summary (Last 24 hours) at 10/15/2019 0703 Last data filed at 10/15/2019 0000 Gross per 24 hour  Intake 626.38 ml  Output -  Net 626.38 ml   Filed Weights   10/13/19 0500 10/14/19 0500 10/15/19 0357  Weight: 72.6 kg 68.4 kg 68.4 kg    Examination:  General exam: Appears calm and comfortable  Respiratory system: Clear to auscultation. Respiratory effort normal.  Currently on 3.5 L nasal cannula oxygen. Cardiovascular system: S1 & S2 heard, RRR. No JVD, murmurs, rubs, gallops or clicks. No pedal edema. Gastrointestinal system: Abdomen is nondistended, soft and nontender. No organomegaly or masses felt. Normal bowel sounds heard. Central nervous system: Alert and awake Extremities: Symmetric 5 x 5 power. Skin: No rashes,  lesions or ulcers Psychiatry: Judgement and insight appear normal. Mood & affect appropriate.     Data Reviewed: I have personally reviewed following labs and imaging studies  CBC: Recent Labs  Lab 10/12/19 0930 10/13/19 0346 10/14/19 0416 10/15/19 0547  WBC 6.8 4.8 5.4 7.4  NEUTROABS 5.5  --   --   --   HGB 11.0* 10.3* 12.2* 11.5*  HCT 34.6* 31.8* 37.1* 35.3*  MCV 87.6 85.7 83.0 83.5  PLT 189 192 221 222   Basic Metabolic Panel: Recent Labs  Lab 10/12/19 0930 10/13/19 0346 10/14/19 0416 10/15/19 0547  NA 131* 130* 134* 134*  K 4.1 3.7 3.7 3.1*  CL 75* 78* 80* 84*  CO2 45* 41* 37* 38*  GLUCOSE 105* 106* 146* 98  BUN 5* 13 23* 20  CREATININE 0.52* 0.70 0.57* 0.56*  CALCIUM 9.1 8.6* 9.6 9.0  MG  --  1.9 2.2 2.0   GFR: Estimated Creatinine Clearance: 96.2 mL/min (A) (by C-G formula based on SCr of 0.56 mg/dL (L)). Liver Function Tests: No results for input(s): AST, ALT, ALKPHOS, BILITOT, PROT, ALBUMIN in the last 168 hours. No results for input(s): LIPASE, AMYLASE in the last 168 hours. No results for input(s): AMMONIA in the last 168 hours. Coagulation Profile: No results for input(s): INR, PROTIME in the last 168 hours. Cardiac Enzymes: No results for input(s): CKTOTAL, CKMB, CKMBINDEX, TROPONINI in the last  168 hours. BNP (last 3 results) No results for input(s): PROBNP in the last 8760 hours. HbA1C: No results for input(s): HGBA1C in the last 72 hours. CBG: No results for input(s): GLUCAP in the last 168 hours. Lipid Profile: No results for input(s): CHOL, HDL, LDLCALC, TRIG, CHOLHDL, LDLDIRECT in the last 72 hours. Thyroid Function Tests: No results for input(s): TSH, T4TOTAL, FREET4, T3FREE, THYROIDAB in the last 72 hours. Anemia Panel: No results for input(s): VITAMINB12, FOLATE, FERRITIN, TIBC, IRON, RETICCTPCT in the last 72 hours. Sepsis Labs: No results for input(s): PROCALCITON, LATICACIDVEN in the last 168 hours.  Recent Results (from the past  240 hour(s))  SARS Coronavirus 2 by RT PCR (hospital order, performed in [facility] hospital lab) Nasopharyngeal Nasopharyngeal Swab     Status: None   Collection Time: 10/12/19 11:39 AM   Specimen: Nasopharyngeal Swab  Result Value Ref Range Status   SARS Coronavirus 2 NEGATIVE NEGATIVE Final    Comment: (NOTE) If result is NEGATIVE SARS-CoV-2 target nucleic acids are NOT DETECTED. The SARS-CoV-2 RNA is generally detectable in upper and lower  respiratory specimens during the acute phase of infection. The lowest  concentration of SARS-CoV-2 viral copies this assay can detect is 250  copies / mL. A negative result does not preclude SARS-CoV-2 infection  and should not be used as the sole basis for treatment or other  patient management decisions.  A negative result may occur with  improper specimen collection / handling, submission of specimen other  than nasopharyngeal swab, presence of viral mutation(s) within the  areas targeted by this assay, and inadequate number of viral copies  (<250 copies / mL). A negative result must be combined with clinical  observations, patient history, and epidemiological information. If result is POSITIVE SARS-CoV-2 target nucleic acids are DETECTED. The SARS-CoV-2 RNA is generally detectable in upper and lower  respiratory specimens dur ing the acute phase of infection.  Positive  results are indicative of active infection with SARS-CoV-2.  Clinical  correlation with patient history and other diagnostic information is  necessary to determine patient infection status.  Positive results do  not rule out bacterial infection or co-infection with other viruses. If result is PRESUMPTIVE POSTIVE SARS-CoV-2 nucleic acids MAY BE PRESENT.   A presumptive positive result was obtained on the submitted specimen  and confirmed on repeat testing.  While 2019 novel coronavirus  (SARS-CoV-2) nucleic acids may be present in the submitted sample  additional  confirmatory testing may be necessary for epidemiological  and / or clinical management purposes  to differentiate between  SARS-CoV-2 and other Sarbecovirus currently known to infect humans.  If clinically indicated additional testing with an alternate test  methodology [phone]) is advised. The SARS-CoV-2 RNA is generally  detectable in upper and lower respiratory sp ecimens during the acute  phase of infection. The expected result is Negative. Fact Sheet for Patients:  BoilerBrush.com.cy Fact Sheet for Healthcare Providers: https://pope.com/ This test is not yet approved or cleared by the [place] FDA and has been authorized for detection and/or diagnosis of SARS-CoV-2 by FDA under an Emergency Use Authorization (EUA).  This EUA will remain in effect (meaning this test can be used) for the duration of the COVID-19 declaration under Section 564(b)(1) of the Act, 21 U.S.C. section 360bbb-3(b)(1), unless the authorization is terminated or revoked sooner. Performed at [facility], [address] [place], [place] [address]   MRSA PCR Screening     Status: None   Collection Time: 10/12/19  3:00 PM   Specimen: Nasal Mucosa; Nasopharyngeal  Result Value Ref Range Status   MRSA by PCR NEGATIVE NEGATIVE Final    Comment:        The GeneXpert MRSA Assay (FDA approved for NASAL specimens only), is one component of a comprehensive MRSA colonization surveillance program. It is not intended to diagnose MRSA infection nor to guide or monitor treatment for MRSA infections. Performed at [facility], [address] [place], [place] [address]          Radiology Studies: No results found.      Scheduled Meds: . aspirin EC  81 mg Oral QPM  . atorvastatin  20 mg Oral QPM  . budesonide (PULMICORT) nebulizer solution  0.25 mg Nebulization BID  . Chlorhexidine Gluconate Cloth  6 each Topical Q0600  . divalproex  1,500 mg Oral QHS   . enoxaparin (LOVENOX) injection  40 mg Subcutaneous Q24H  . feeding supplement (ENSURE ENLIVE)  237 mL Oral BID BM  . FLUoxetine  40 mg Oral Daily  . folic acid  1 mg Oral Daily  . furosemide  40 mg Oral Daily  . gabapentin  400 mg Oral TID  . guaiFENesin  600 mg Oral BID  . ipratropium-albuterol  3 mL Nebulization Q6H  . methocarbamol  500 mg Oral TID  . methylPREDNISolone (SOLU-MEDROL) injection  40 mg Intravenous Q12H  . multivitamin with minerals  1 tablet Oral Daily  . OLANZapine  15 mg Oral QHS  . pantoprazole (PROTONIX) IV  40 mg Intravenous Q24H  . potassium chloride SA  20 mEq Oral Daily  . potassium chloride  40 mEq Oral Once  . sodium chloride flush  3 mL Intravenous Q12H  . thiamine  100 mg Oral Daily  . traZODone  100 mg Oral QHS   Continuous Infusions: . sodium chloride    . dexmedetomidine (PRECEDEX) IV infusion 1.2 mcg/kg/hr (10/14/19 1253)     LOS: 3 days    Time spent: 30 minutes    Katee Wentland Hoover Brunette, DO [facility] Pager [phone]  If 7PM-7AM, please contact night-coverage [email or web address] Password [facility] 10/15/2019, 7:03 AM

## 2019-10-16 LAB — BASIC METABOLIC PANEL
Anion gap: 11 (ref 5–15)
BUN: 19 mg/dL (ref 6–20)
CO2: 33 mmol/L — ABNORMAL HIGH (ref 22–32)
Calcium: 8.8 mg/dL — ABNORMAL LOW (ref 8.9–10.3)
Chloride: 88 mmol/L — ABNORMAL LOW (ref 98–111)
Creatinine, Ser: 0.61 mg/dL (ref 0.61–1.24)
GFR calc Af Amer: >60 mL/min (ref >60)
GFR calc non Af Amer: >60 mL/min (ref >60)
Glucose, Bld: 102 mg/dL — ABNORMAL HIGH (ref 70–99)
Potassium: 3.8 mmol/L (ref 3.5–5.1)
Sodium: 132 mmol/L — ABNORMAL LOW (ref 135–145)

## 2019-10-16 LAB — CBC
HCT: 34.5 % — ABNORMAL LOW (ref 39.0–52.0)
Hemoglobin: 11.1 g/dL — ABNORMAL LOW (ref 13.0–17.0)
MCH: 27.4 pg (ref 26.0–34.0)
MCHC: 32.2 g/dL (ref 30.0–36.0)
MCV: 85.2 fL (ref 80.0–100.0)
Platelets: 223 10*3/uL (ref 150–400)
RBC: 4.05 MIL/uL — ABNORMAL LOW (ref 4.22–5.81)
RDW: 13.6 % (ref 11.5–15.5)
WBC: 4.9 10*3/uL (ref 4.0–10.5)
nRBC: 0 % (ref 0.0–0.2)

## 2019-10-16 LAB — MAGNESIUM: Magnesium: 2 mg/dL (ref 1.7–2.4)

## 2019-10-16 MED ORDER — PREDNISONE 20 MG PO TABS: 40.0000 mg | ORAL_TABLET | Freq: Every day | ORAL | 0 refills | Status: AC

## 2019-10-16 NOTE — Discharge Summary (Signed)
Physician Discharge Summary  Jeffrey [name] MVH:[id number] DOB: 17-Nov-1961 DOA: 10/12/2019  PCP: Gareth Morgan, MD  Admit date: 10/12/2019  Discharge date: 10/16/2019  Admitted From:Home  Disposition:  Home  Recommendations for Outpatient Follow-up:  1. Follow up with PCP in 1-2 weeks 2. Continue on prior home medications.  It is unclear if patient had overdosed on any of his home medications as neither he nor his ex-wife would admit to this.  Patient also had denied any ongoing alcohol use, though daughter still concerned about this being a possibility.  Discussed the need to strictly adhere to his medication regimen as prescribed and to avoid any further alcohol use.  He has a high risk for readmission on this account.  Urine toxicology ordered during this admission which unfortunately had never resulted. 3. Continue on prednisone for 5 more days to complete course of treatment for COPD exacerbation and remain at home 3-4 L nasal cannula.  Home Health: None  Equipment/Devices: Home 3-4 L nasal cannula  Discharge Condition: Stable  CODE STATUS: Full  Diet recommendation: Heart Healthy  Brief/Interim Summary: Per HPI: Jeffrey [name] a 58 y.o.malewith medical history significant forformer tobacco abuse with history of COPD and chronic hypoxemia on 4 L nasal cannula, former alcoholic, diastolic congestive heart failure-grade 2, hypertension, dyslipidemia, peripheral vascular disease, depression/anxiety, and bipolar disorder who presented to the emergency department who has been having gradual worsening of shortness of breath over the last 1 month with noted orthopnea as well as weight gain of approximately 5 pounds. He has been taking his Lasix 40 mg daily at home otherwise. He has had an acute change in the last 24 to 48 hours, however with some chest tightness and wheezing. His wife had also noted that he is a little bit more confused and lethargic. He denies any lower  extremity edema and does have a mild cough that is nonproductive. He has been taking his home breathing treatments with minimal improvement in his symptoms.  11/20: Patient was admitted with acute hypercapnic respiratory failure secondary to acute on chronic COPD exacerbation as well as component of CHF. 2D echocardiogram reviewed with stable LVEF at 65-70% and grade 1 diastolic dysfunction noted. He refuses to wear BiPAP overnight and his PCO2 continues to remain elevated. He continues to remain somewhat agitated this morning.  11/21:Patient continued to have intermittent agitation throughout the evening and Precedex drip was started yesterday afternoon. He continues to have periods of agitation signaling potential withdrawal symptoms. His PCO2 had improved on ABG yesterday and he has stayed off BiPAP. We will recheck this morning. We will plan to check urine toxicology as he has had prior cocaine and THC use noted.  11/22: Patient is doing much better this morning he is more lucid and has been off his Precedex for at least over 12 hours.  He is stable for transfer to telemetry this morning.  He continues to remain on nasal cannula oxygen without any significant respiratory distress.  11/23: Patient continues to do quite well without any symptoms of agitation or anxiety noted and has not required any more Ativan or sedating agents.  He has remained off Precedex for well over 24 hours and appears to be stable for discharge.  He is quite lucid, alert and oriented.  He has no further wheezing or respiratory distress and remains on his baseline 3-4 L nasal cannula oxygen and is stable for discharge on oral prednisone to continue for 5 more days to complete the course of  treatment.  He has home nebulizer treatments as needed.  Chest x-ray performed on 11/22 with no findings of pneumonia or any other acute process.  Venous blood gas noted with PCO2 that remains in the 50-60 range.  Discharge  Diagnoses:  Active Problems:   COPD with acute exacerbation (HCC)  Principal discharge diagnosis: Acute encephalopathy likely secondary to potential withdrawal symptoms, but substance not identified.  Acute hypercapnic respiratory failure secondary to acute on chronic COPD exacerbation.  Discharge Instructions  Discharge Instructions    Diet - low sodium heart healthy   Complete by: As directed    Increase activity slowly   Complete by: As directed      Allergies as of 10/16/2019      Reactions   Augmentin [amoxicillin-pot Clavulanate] Rash, Other (See Comments)   Has patient had a PCN reaction causing immediate rash, facial/tongue/throat swelling, SOB or lightheadedness with hypotension: No Has patient had a PCN reaction causing severe rash involving mucus membranes or skin necrosis: No Has patient had a PCN reaction that required hospitalization No Has patient had a PCN reaction occurring within the last 10 years: Yes If all of the above answers are "NO", then may proceed with Cephalosporin use.   Ace Inhibitors Hives      Medication List    TAKE these medications   albuterol (2.5 MG/3ML) 0.083% nebulizer solution Commonly known as: PROVENTIL Take 3 mLs (2.5 mg total) by nebulization every 4 (four) hours as needed for shortness of breath.   albuterol 108 (90 Base) MCG/ACT inhaler Commonly known as: ProAir HFA Inhale 2 puffs into the lungs every 4 (four) hours as needed for wheezing or shortness of breath.   aspirin EC 81 MG tablet Take 81 mg by mouth every evening.   atorvastatin 20 MG tablet Commonly known as: LIPITOR Take 1 tablet (20 mg total) by mouth daily. For high cholesterol What changed: when to take this   Delsym 30 MG/5ML liquid Generic drug: dextromethorphan Take 30 mg by mouth 2 (two) times daily as needed for cough.   diltiazem 180 MG 24 hr capsule Commonly known as: CARDIZEM CD Take 1 capsule (180 mg total) by mouth daily.   divalproex 500 MG 24  hr tablet Commonly known as: DEPAKOTE ER Take 3 tablets (1,500 mg total) by mouth at bedtime.   feeding supplement (ENSURE ENLIVE) Liqd Take 237 mLs by mouth 2 (two) times daily between meals.   FLUoxetine 40 MG capsule Commonly known as: PROZAC Take 1 capsule (40 mg total) by mouth daily. For mood control   furosemide 20 MG tablet Commonly known as: LASIX Take 2 tablets (40 mg total) by mouth daily for 30 days.   gabapentin 400 MG capsule Commonly known as: NEURONTIN Take 1 capsule (400 mg total) by mouth 3 (three) times daily.   guaiFENesin 600 MG 12 hr tablet Commonly known as: MUCINEX Take 600 mg by mouth 2 (two) times daily.   ipratropium 0.02 % nebulizer solution Commonly known as: ATROVENT Take 2.5 mLs (0.5 mg total) by nebulization 3 (three) times daily.   methocarbamol 500 MG tablet Commonly known as: ROBAXIN Take 1 tablet by mouth 3 (three) times daily.   metoprolol tartrate 25 MG tablet Commonly known as: LOPRESSOR Take 1 tablet (25 mg total) by mouth 2 (two) times daily. What changed:   how much to take  when to take this   multivitamin with minerals Tabs tablet Take 1 tablet by mouth daily.   OLANZapine 10 MG tablet  Commonly known as: ZYPREXA Take 1.5 tablets (15 mg total) by mouth at bedtime. For mood stability   potassium chloride SA 20 MEQ tablet Commonly known as: KLOR-CON Take 1 tablet by mouth daily.   predniSONE 20 MG tablet Commonly known as: Deltasone Take 2 tablets (40 mg total) by mouth daily for 5 days.   traZODone 50 MG tablet Commonly known as: DESYREL Take 100 mg by mouth at bedtime.   zolpidem 5 MG tablet Commonly known as: AMBIEN TAKE 1 TABLET BY MOUTH AT BEDTIME FOR SLEEP      Follow-up Information    Gareth Morgan, MD Follow up in 1 week(s).   Specialty: Family Medicine Contact information: [address] [place] [address] [phone]          Allergies  Allergen Reactions  . Augmentin  [Amoxicillin-Pot Clavulanate] Rash and Other (See Comments)    Has patient had a PCN reaction causing immediate rash, facial/tongue/throat swelling, SOB or lightheadedness with hypotension: No Has patient had a PCN reaction causing severe rash involving mucus membranes or skin necrosis: No Has patient had a PCN reaction that required hospitalization No Has patient had a PCN reaction occurring within the last 10 years: Yes If all of the above answers are "NO", then may proceed with Cephalosporin use.  Donivan Scull Inhibitors Hives    Consultations:  None   Procedures/Studies: Dg Chest Port 1 View  Result Date: 10/15/2019 CLINICAL DATA:  Dyspnea EXAM: PORTABLE CHEST 1 VIEW COMPARISON:  10/12/2019 FINDINGS: Hyperinflation and chronic generalized interstitial coarsening. Chronic blunting of the left costophrenic sulcus. The right sulcus is obscured from view. Normal heart size and aortic contours. There is no edema, consolidation, effusion, or pneumothorax. IMPRESSION: COPD without acute superimposed finding. Electronically Signed   By: Marnee Spring M.D.   On: 10/15/2019 10:40   Dg Chest Port 1 View  Result Date: 10/12/2019 CLINICAL DATA:  Shortness of breath. EXAM: PORTABLE CHEST 1 VIEW COMPARISON:  April 30, 2019. FINDINGS: The heart size and mediastinal contours are within normal limits. No pneumothorax or pleural effusion is noted. Bibasilar interstitial densities are noted concerning for pulmonary edema. The visualized skeletal structures are unremarkable. IMPRESSION: Findings and concerning for bilateral pulmonary edema. Electronically Signed   By: Lupita Raider M.D.   On: 10/12/2019 09:52     Discharge Exam: Vitals:   10/16/19 0400 10/16/19 0500  BP: (!) 78/62 (!) 106/25  Pulse: 91 83  Resp: (!) 22 17  Temp:  98.6 F (37 C)  SpO2: 97% 98%   Vitals:   10/16/19 0203 10/16/19 0300 10/16/19 0400 10/16/19 0500  BP:  (!) 100/55 (!) 78/62 (!) 106/25  Pulse:  77 91 83  Resp:  14 (!)  22 17  Temp:    98.6 F (37 C)  TempSrc:      SpO2: 100% 100% 97% 98%  Weight:      Height:        General: Pt is alert, awake, not in acute distress Cardiovascular: RRR, S1/S2 +, no rubs, no gallops Respiratory: CTA bilaterally, no wheezing, no rhonchi, currently on 3-4 L nasal cannula Abdominal: Soft, NT, ND, bowel sounds + Extremities: no edema, no cyanosis    The results of significant diagnostics from this hospitalization (including imaging, microbiology, ancillary and laboratory) are listed below for reference.     Microbiology: Recent Results (from the past 240 hour(s))  SARS Coronavirus 2 by RT PCR (hospital order, performed in [facility] hospital lab) Nasopharyngeal Nasopharyngeal  Swab     Status: None   Collection Time: 10/12/19 11:39 AM   Specimen: Nasopharyngeal Swab  Result Value Ref Range Status   SARS Coronavirus 2 NEGATIVE NEGATIVE Final    Comment: (NOTE) If result is NEGATIVE SARS-CoV-2 target nucleic acids are NOT DETECTED. The SARS-CoV-2 RNA is generally detectable in upper and lower  respiratory specimens during the acute phase of infection. The lowest  concentration of SARS-CoV-2 viral copies this assay can detect is 250  copies / mL. A negative result does not preclude SARS-CoV-2 infection  and should not be used as the sole basis for treatment or other  patient management decisions.  A negative result may occur with  improper specimen collection / handling, submission of specimen other  than nasopharyngeal swab, presence of viral mutation(s) within the  areas targeted by this assay, and inadequate number of viral copies  (<250 copies / mL). A negative result must be combined with clinical  observations, patient history, and epidemiological information. If result is POSITIVE SARS-CoV-2 target nucleic acids are DETECTED. The SARS-CoV-2 RNA is generally detectable in upper and lower  respiratory specimens dur ing the acute phase of infection.   Positive  results are indicative of active infection with SARS-CoV-2.  Clinical  correlation with patient history and other diagnostic information is  necessary to determine patient infection status.  Positive results do  not rule out bacterial infection or co-infection with other viruses. If result is PRESUMPTIVE POSTIVE SARS-CoV-2 nucleic acids MAY BE PRESENT.   A presumptive positive result was obtained on the submitted specimen  and confirmed on repeat testing.  While 2019 novel coronavirus  (SARS-CoV-2) nucleic acids may be present in the submitted sample  additional confirmatory testing may be necessary for epidemiological  and / or clinical management purposes  to differentiate between  SARS-CoV-2 and other Sarbecovirus currently known to infect humans.  If clinically indicated additional testing with an alternate test  methodology [phone]) is advised. The SARS-CoV-2 RNA is generally  detectable in upper and lower respiratory sp ecimens during the acute  phase of infection. The expected result is Negative. Fact Sheet for Patients:  BoilerBrush.com.cy Fact Sheet for Healthcare Providers: https://pope.com/ This test is not yet approved or cleared by the [place] FDA and has been authorized for detection and/or diagnosis of SARS-CoV-2 by FDA under an Emergency Use Authorization (EUA).  This EUA will remain in effect (meaning this test can be used) for the duration of the COVID-19 declaration under Section 564(b)(1) of the Act, 21 U.S.C. section 360bbb-3(b)(1), unless the authorization is terminated or revoked sooner. Performed at [facility], [address] [place], [place] [address]   MRSA PCR Screening     Status: None   Collection Time: 10/12/19  3:00 PM   Specimen: Nasal Mucosa; Nasopharyngeal  Result Value Ref Range Status   MRSA by PCR NEGATIVE NEGATIVE Final    Comment:        The GeneXpert MRSA Assay (FDA approved  for NASAL specimens only), is one component of a comprehensive MRSA colonization surveillance program. It is not intended to diagnose MRSA infection nor to guide or monitor treatment for MRSA infections. Performed at [facility], [address] [place], [place] [address]      Labs: BNP (last 3 results) Recent Labs    04/30/19 1340 10/12/19 0930  BNP 245.0* 306.0*   Basic Metabolic Panel: Recent Labs  Lab 10/12/19 0930 10/13/19 0346 10/14/19 0416 10/15/19 0547 10/16/19 0445  NA 131* 130*  134* 134* 132*  K 4.1 3.7 3.7 3.1* 3.8  CL 75* 78* 80* 84* 88*  CO2 45* 41* 37* 38* 33*  GLUCOSE 105* 106* 146* 98 102*  BUN 5* 13 23* 20 19  CREATININE 0.52* 0.70 0.57* 0.56* 0.61  CALCIUM 9.1 8.6* 9.6 9.0 8.8*  MG  --  1.9 2.2 2.0 2.0   Liver Function Tests: No results for input(s): AST, ALT, ALKPHOS, BILITOT, PROT, ALBUMIN in the last 168 hours. No results for input(s): LIPASE, AMYLASE in the last 168 hours. No results for input(s): AMMONIA in the last 168 hours. CBC: Recent Labs  Lab 10/12/19 0930 10/13/19 0346 10/14/19 0416 10/15/19 0547 10/16/19 0445  WBC 6.8 4.8 5.4 7.4 4.9  NEUTROABS 5.5  --   --   --   --   HGB 11.0* 10.3* 12.2* 11.5* 11.1*  HCT 34.6* 31.8* 37.1* 35.3* 34.5*  MCV 87.6 85.7 83.0 83.5 85.2  PLT 189 192 221 222 223   Cardiac Enzymes: No results for input(s): CKTOTAL, CKMB, CKMBINDEX, TROPONINI in the last 168 hours. BNP: Invalid input(s): POCBNP CBG: No results for input(s): GLUCAP in the last 168 hours. D-Dimer No results for input(s): DDIMER in the last 72 hours. Hgb A1c No results for input(s): HGBA1C in the last 72 hours. Lipid Profile No results for input(s): CHOL, HDL, LDLCALC, TRIG, CHOLHDL, LDLDIRECT in the last 72 hours. Thyroid function studies No results for input(s): TSH, T4TOTAL, T3FREE, THYROIDAB in the last 72 hours.  Invalid input(s): FREET3 Anemia work up No results for input(s): VITAMINB12, FOLATE, FERRITIN, TIBC, IRON,  RETICCTPCT in the last 72 hours. Urinalysis    Component Value Date/Time   COLORURINE STRAW (A) 10/12/2019 0921   APPEARANCEUR CLEAR 10/12/2019 0921   LABSPEC 1.003 (L) 10/12/2019 0921   PHURINE 7.0 10/12/2019 0921   GLUCOSEU NEGATIVE 10/12/2019 0921   HGBUR NEGATIVE 10/12/2019 0921   BILIRUBINUR NEGATIVE 10/12/2019 0921   KETONESUR NEGATIVE 10/12/2019 0921   PROTEINUR NEGATIVE 10/12/2019 0921   UROBILINOGEN 0.2 03/01/2011 1427   NITRITE NEGATIVE 10/12/2019 0921   LEUKOCYTESUR NEGATIVE 10/12/2019 0921   Sepsis Labs Invalid input(s): PROCALCITONIN,  WBC,  LACTICIDVEN Microbiology Recent Results (from the past 240 hour(s))  SARS Coronavirus 2 by RT PCR (hospital order, performed in [facility] hospital lab) Nasopharyngeal Nasopharyngeal Swab     Status: None   Collection Time: 10/12/19 11:39 AM   Specimen: Nasopharyngeal Swab  Result Value Ref Range Status   SARS Coronavirus 2 NEGATIVE NEGATIVE Final    Comment: (NOTE) If result is NEGATIVE SARS-CoV-2 target nucleic acids are NOT DETECTED. The SARS-CoV-2 RNA is generally detectable in upper and lower  respiratory specimens during the acute phase of infection. The lowest  concentration of SARS-CoV-2 viral copies this assay can detect is 250  copies / mL. A negative result does not preclude SARS-CoV-2 infection  and should not be used as the sole basis for treatment or other  patient management decisions.  A negative result may occur with  improper specimen collection / handling, submission of specimen other  than nasopharyngeal swab, presence of viral mutation(s) within the  areas targeted by this assay, and inadequate number of viral copies  (<250 copies / mL). A negative result must be combined with clinical  observations, patient history, and epidemiological information. If result is POSITIVE SARS-CoV-2 target nucleic acids are DETECTED. The SARS-CoV-2 RNA is generally detectable in upper and lower  respiratory specimens  dur ing the acute phase of infection.  Positive  results are indicative of active infection with SARS-CoV-2.  Clinical  correlation with patient history and other diagnostic information is  necessary to determine patient infection status.  Positive results do  not rule out bacterial infection or co-infection with other viruses. If result is PRESUMPTIVE POSTIVE SARS-CoV-2 nucleic acids MAY BE PRESENT.   A presumptive positive result was obtained on the submitted specimen  and confirmed on repeat testing.  While 2019 novel coronavirus  (SARS-CoV-2) nucleic acids may be present in the submitted sample  additional confirmatory testing may be necessary for epidemiological  and / or clinical management purposes  to differentiate between  SARS-CoV-2 and other Sarbecovirus currently known to infect humans.  If clinically indicated additional testing with an alternate test  methodology [phone]) is advised. The SARS-CoV-2 RNA is generally  detectable in upper and lower respiratory sp ecimens during the acute  phase of infection. The expected result is Negative. Fact Sheet for Patients:  StrictlyIdeas.no Fact Sheet for Healthcare Providers: BankingDealers.co.za This test is not yet approved or cleared by the [place] FDA and has been authorized for detection and/or diagnosis of SARS-CoV-2 by FDA under an Emergency Use Authorization (EUA).  This EUA will remain in effect (meaning this test can be used) for the duration of the COVID-19 declaration under Section 564(b)(1) of the Act, 21 U.S.C. section 360bbb-3(b)(1), unless the authorization is terminated or revoked sooner. Performed at [facility], [address], [place], [place] [address]   MRSA PCR Screening     Status: None   Collection Time: 10/12/19  3:00 PM   Specimen: Nasal Mucosa; Nasopharyngeal  Result Value Ref Range Status   MRSA by PCR NEGATIVE NEGATIVE Final    Comment:         The GeneXpert MRSA Assay (FDA approved for NASAL specimens only), is one component of a comprehensive MRSA colonization surveillance program. It is not intended to diagnose MRSA infection nor to guide or monitor treatment for MRSA infections. Performed at [facility], [address], [place], [place] [address]      Time coordinating discharge: 35 minutes  SIGNED:   Rodena Goldmann, DO [facility] 10/16/2019, 7:29 AM  If 7PM-7AM, please contact night-coverage [email or web address]

## 2019-10-17 LAB — DRUG PROFILE, UR, 9 DRUGS (LABCORP)
Amphetamines, Urine: NEGATIVE ng/mL
Barbiturate, Ur: NEGATIVE ng/mL
Benzodiazepine Quant, Ur: NEGATIVE ng/mL
Cannabinoid Quant, Ur: NEGATIVE ng/mL
Cocaine (Metab.): NEGATIVE ng/mL
Methadone Screen, Urine: NEGATIVE ng/mL
Opiate Quant, Ur: NEGATIVE ng/mL
Phencyclidine, Ur: NEGATIVE ng/mL
Propoxyphene, Urine: NEGATIVE ng/mL

## 2019-10-24 ENCOUNTER — Telehealth (HOSPITAL_COMMUNITY): Payer: Self-pay | Admitting: *Deleted

## 2019-10-24 ENCOUNTER — Other Ambulatory Visit (HOSPITAL_COMMUNITY): Payer: Self-pay | Admitting: Psychiatry

## 2019-10-24 MED ORDER — GABAPENTIN 400 MG PO CAPS: 400.0000 mg | ORAL_CAPSULE | Freq: Three times a day (TID) | ORAL | 2 refills | Status: DC

## 2019-10-24 MED ORDER — DIVALPROEX SODIUM ER 500 MG PO TB24: 1500.0000 mg | ORAL_TABLET | Freq: Every day | ORAL | 2 refills | Status: DC

## 2019-10-24 MED ORDER — FLUOXETINE HCL 40 MG PO CAPS: 40.0000 mg | ORAL_CAPSULE | Freq: Every day | ORAL | 2 refills | Status: DC

## 2019-10-24 MED ORDER — TRAZODONE HCL 50 MG PO TABS: 100.0000 mg | ORAL_TABLET | Freq: Every day | ORAL | 2 refills | Status: DC

## 2019-10-24 MED ORDER — OLANZAPINE 10 MG PO TABS: 15.0000 mg | ORAL_TABLET | Freq: Every day | ORAL | 2 refills | Status: DC

## 2019-10-24 NOTE — Telephone Encounter (Signed)
PATIENT'S WIFE CALLED STATED PATIENT HAAS BEEN RECENTLY D/C FROM [facility]  10/12/2019-11-23/2020. REQUESTING THAT HIS MED'S BE SENT TO [facility] PRE-PACKAGE PILLS. TO HELP KEEP MED'S FROM GETTING ALL MIXED UP

## 2019-10-24 NOTE — Telephone Encounter (Signed)
Sent, but I am not renewing Jeffrey [name] given his recent oversedation

## 2019-11-04 IMAGING — CT CT HEAD W/O CM
4 of 8 series · 16 of 47 positions shown, 17 images · non-contrast
Comparison: 12/16/2018 head CT.

CLINICAL DATA: Encephalopathy. No reported injury.

EXAM:
CT HEAD WITHOUT CONTRAST
TECHNIQUE: Contiguous axial images were obtained from the base of the skull
through the vertex without intravenous contrast.

[Series 3: head bone · axial · 0.45mm/px · z∈[-188,-88]mm · 6 of 81 slices shown]
[im 6/81  bone]
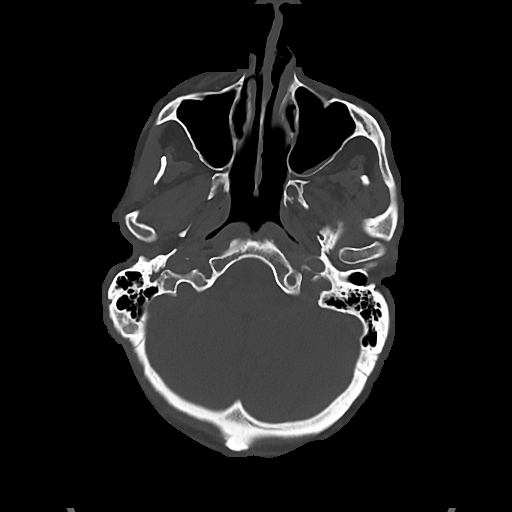
[im 16/81  bone]
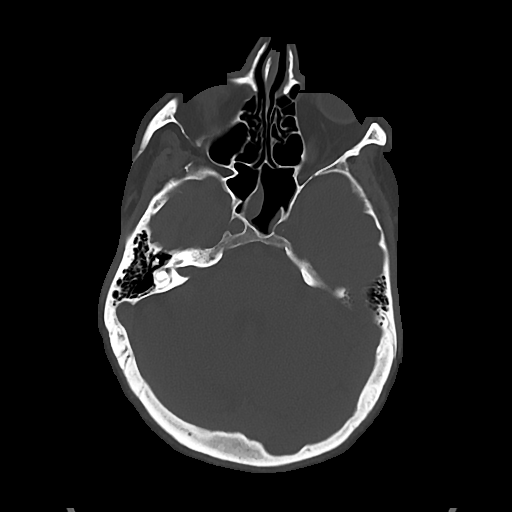
[im 26/81  bone]
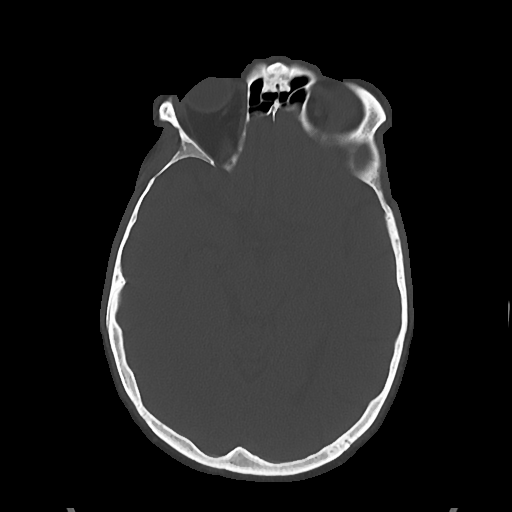
[im 36/81  bone]
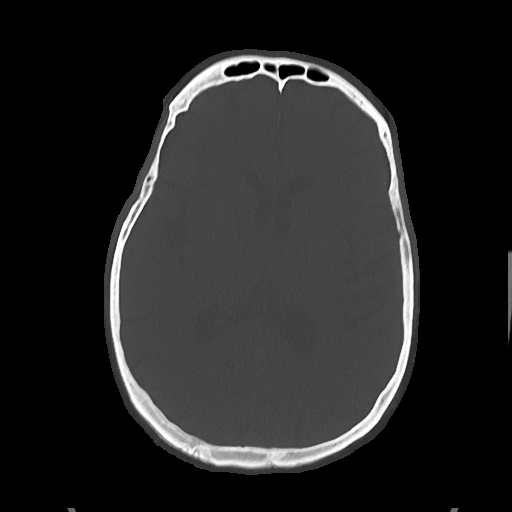
[im 46/81  bone]
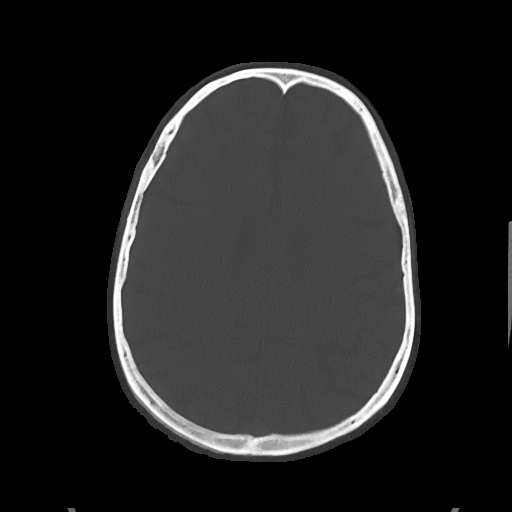
[im 56/81  bone]
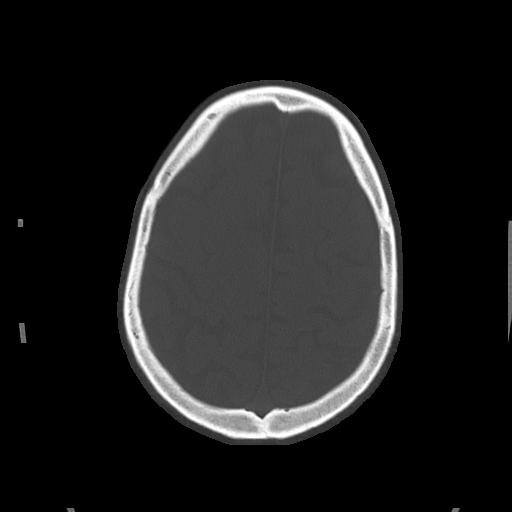

[Series 4: head wo · axial · 0.45mm/px · z∈[-168,-74]mm · 4 of 33 slices shown, 5 images]
[im 7/33  brain]
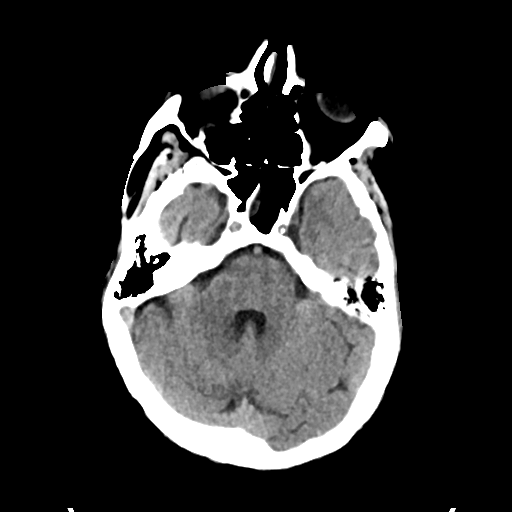
[im 7/33  bone]
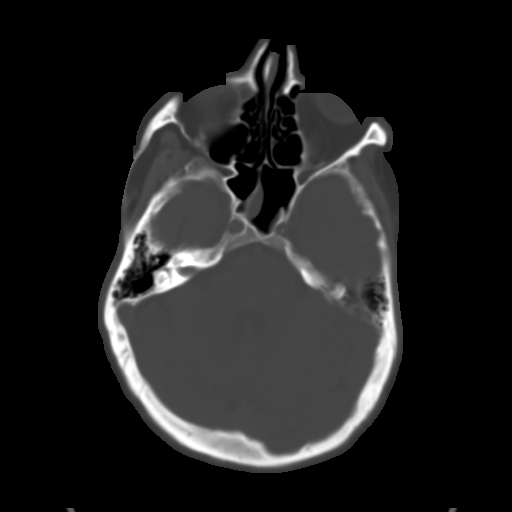
[im 13/33  brain]
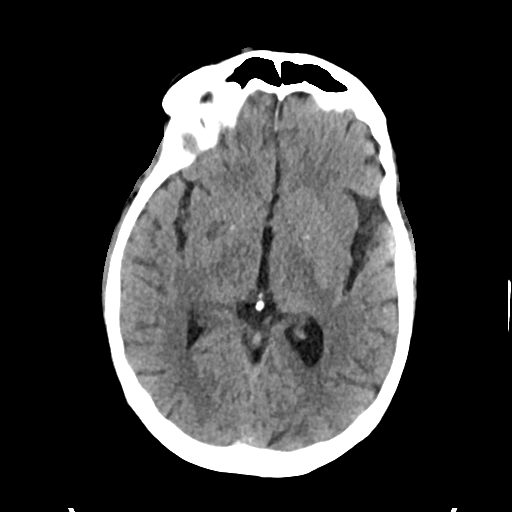
[im 20/33  brain]
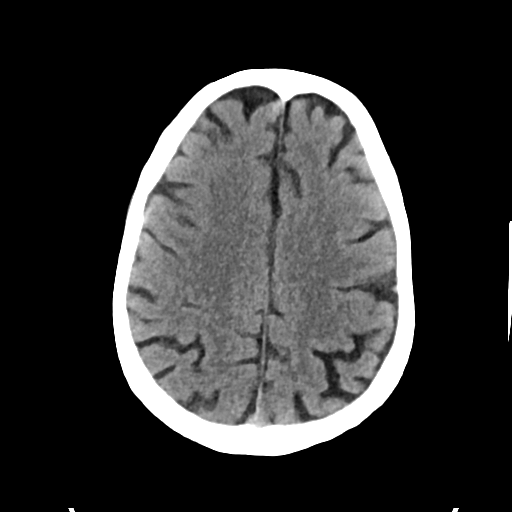
[im 26/33  brain]
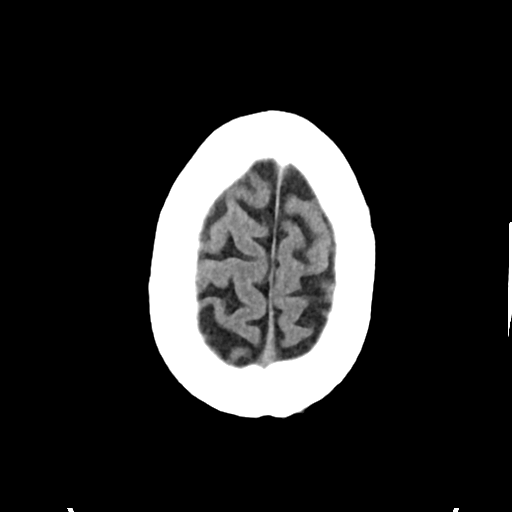

[Series 5: cor soft · coronal · 0.31mm/px · 3 of 79 slices shown]
[im 27/79  brain]
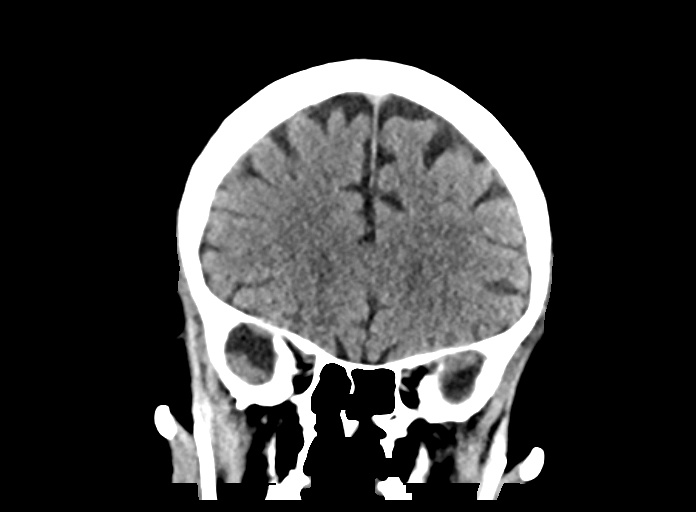
[im 35/79  brain]
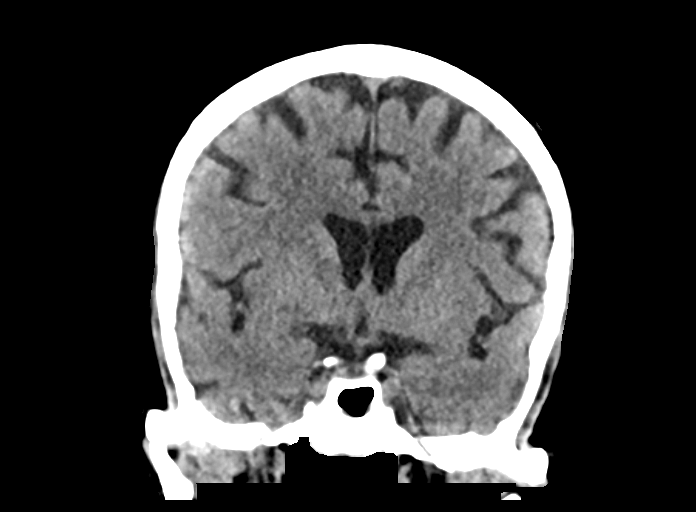
[im 44/79  brain]
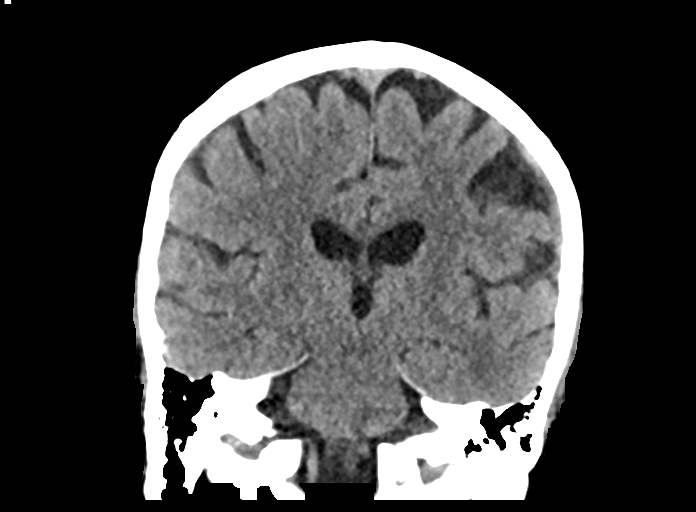

[Series 6: sag soft · sagittal · 0.31mm/px · 3 of 67 slices shown]
[im 23/67  brain]
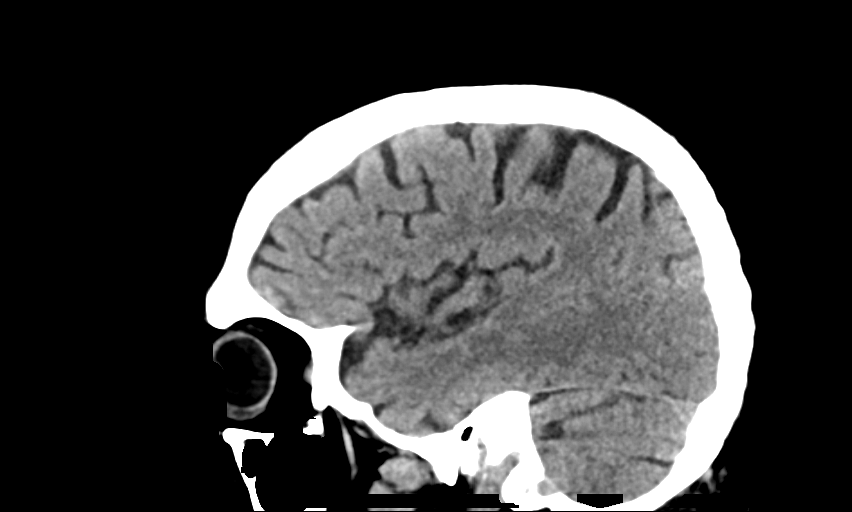
[im 34/67  brain]
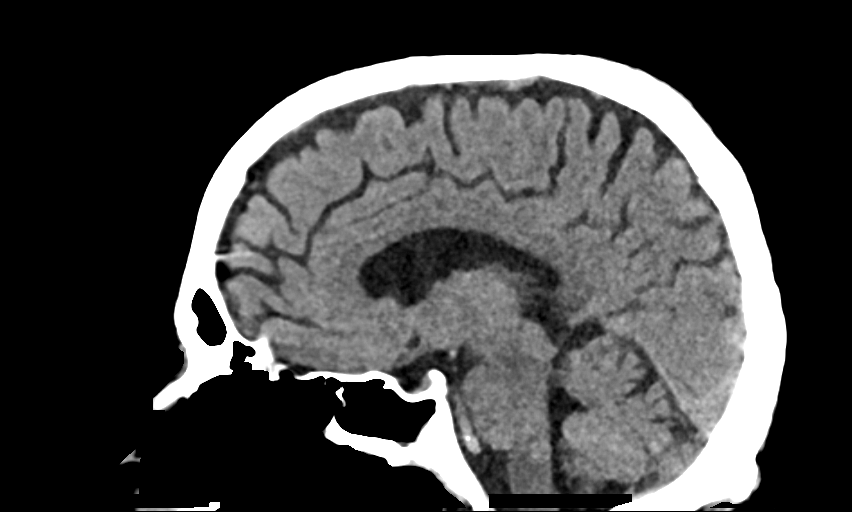
[im 45/67  brain]
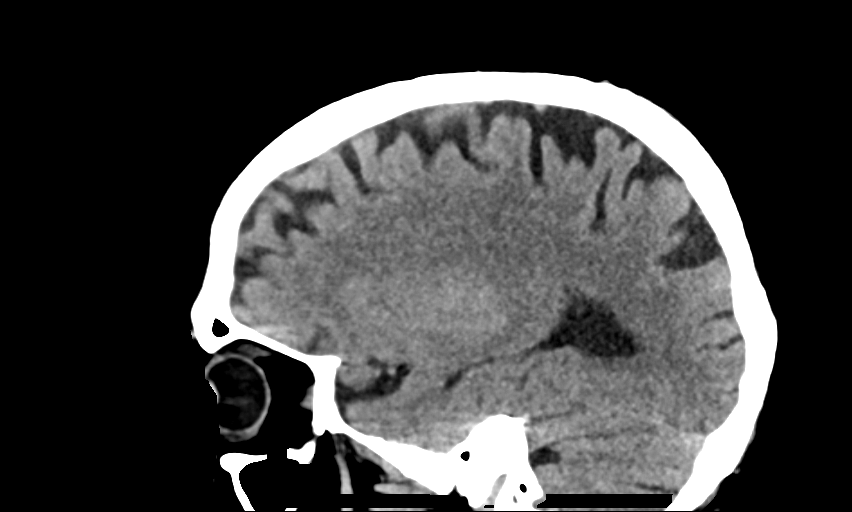

[16 of 47 positions shown; findings below may reference images not displayed]

FINDINGS: Brain: No evidence of parenchymal hemorrhage or extra-axial fluid
collection. No mass lesion, mass effect, or midline shift. No CT
evidence of acute infarction. Stable small perivesical spaces in the
right basal ganglia. Cerebral volume is age appropriate. No
ventriculomegaly.

Vascular: No acute abnormality.

Skull: No evidence of calvarial fracture.

Sinuses/Orbits: No fluid levels. Mild mucoperiosteal thickening
throughout the paranasal sinuses.

Other:  The mastoid air cells are unopacified.
IMPRESSION: 1. No evidence of acute intracranial abnormality.
2. Mild paranasal sinusitis of uncertain acuity.

## 2019-11-14 ENCOUNTER — Ambulatory Visit (INDEPENDENT_AMBULATORY_CARE_PROVIDER_SITE_OTHER): Payer: Medicare Other | Admitting: Psychiatry

## 2019-11-14 ENCOUNTER — Other Ambulatory Visit: Payer: Self-pay

## 2019-11-14 ENCOUNTER — Encounter (HOSPITAL_COMMUNITY): Payer: Self-pay | Admitting: Psychiatry

## 2019-11-14 DIAGNOSIS — F3162 Bipolar disorder, current episode mixed, moderate: Secondary | ICD-10-CM | POA: Diagnosis not present

## 2019-11-14 MED ORDER — FLUOXETINE HCL 40 MG PO CAPS: 40.0000 mg | ORAL_CAPSULE | Freq: Every day | ORAL | 2 refills | Status: DC

## 2019-11-14 MED ORDER — DIVALPROEX SODIUM ER 500 MG PO TB24: 1500.0000 mg | ORAL_TABLET | Freq: Every day | ORAL | 2 refills | Status: DC

## 2019-11-14 MED ORDER — TRAZODONE HCL 50 MG PO TABS: 100.0000 mg | ORAL_TABLET | Freq: Every day | ORAL | 2 refills | Status: DC

## 2019-11-14 MED ORDER — OLANZAPINE 10 MG PO TABS: 15.0000 mg | ORAL_TABLET | Freq: Every day | ORAL | 2 refills | Status: DC

## 2019-11-14 MED ORDER — GABAPENTIN 400 MG PO CAPS: 400.0000 mg | ORAL_CAPSULE | Freq: Three times a day (TID) | ORAL | 2 refills | Status: DC

## 2019-11-14 NOTE — Progress Notes (Signed)
Virtual Visit via Telephone Note  I connected with Jeffrey [name] on 11/14/19 at 10:40 AM EST by telephone and verified that I am speaking with the correct person using two identifiers.   I discussed the limitations, risks, security and privacy concerns of performing an evaluation and management service by telephone and the availability of in person appointments. I also discussed with the patient that there may be a patient responsible charge related to this service. The patient expressed understanding and agreed to proceed.     I discussed the assessment and treatment plan with the patient. The patient was provided an opportunity to ask questions and all were answered. The patient agreed with the plan and demonstrated an understanding of the instructions.   The patient was advised to call back or seek an in-person evaluation if the symptoms worsen or if the condition fails to improve as anticipated.  I provided 15 minutes of non-face-to-face time during this encounter.   Diannia Ruder, MD  [facility] MD/PA/NP OP Progress Note  11/14/2019 11:08 AM Jeffrey [name]  MRN:  [id number]  Chief Complaint:  Chief Complaint    Anxiety; Depression; Manic Behavior; Follow-up     HPI: This patient is a 58 year old white male who is currently living with his ex-wife in [place].  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  The patient returns for follow-up regarding his bipolar disorder.  He was last seen 3 months ago.  The patient was rehospitalized on 10/12/2019 for COPD exacerbation.  During that hospitalization he got very agitated and seemed to be in withdrawal from something.  However his urine drug test was negative and he had not been drinking.  He states he is now living with his wife and she is taking good care of him and making sure he is taking his medication.  He is not using drugs or alcohol.  He states in general his mood is good but his overall health is not good.   He was also diagnosed with congestive heart failure during the last hospitalization.  He is tired a lot and tends to sleep during the day and is not sleeping well at night.  I had discontinued his Ambien because of the oversedation that led to his hospitalization and he is now on trazodone.  His wife states he is not sleeping as well with this but I explained that if he sleeps during the day he is not going to sleep at night.  He denies any thoughts of suicide or self-harm. Visit Diagnosis:    ICD-10-CM   1. Moderate mixed bipolar I disorder (HCC)  F31.62     Past Psychiatric History: Several hospitalizations for depression, the last one being in March 2019 for suicide attempt  Past Medical History:  Past Medical History:  Diagnosis Date  . Acute blood loss anemia 02/07/2017  . Anxiety   . Arthritis    deg disease, bulging disk,  shoulder level  . Bipolar disorder (HCC)   . Bipolar disorder (HCC)   . CHF (congestive heart failure) (HCC)   . 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 Depression    anxiety  . Hyperlipidemia   . Hypertension   . Peptic ulcer disease    Review  . Pneumonia   . [name] (peripheral vascular disease) (HCC) 06/18/2015  . Shortness of breath     Past Surgical History:  Procedure Laterality Date  . BIOPSY  02/09/2017   Procedure: BIOPSY;  Surgeon: [name], MD;  [facility];  Service: Endoscopy;;  duodenal gastric  . COLONOSCOPY  03/14/2007   [id number] colonoscopy and terminal ileoscopy except external hemorrhoids  . COLONOSCOPY N/A 05/05/2013   [name]: external/internal anal canal hemorrhoids, unable to intubate TI, segemental biopsies unremarkable   . COLONOSCOPY N/A 04/11/2017   Procedure: COLONOSCOPY;  Surgeon: [name], MD;  Location: [facility];  Service: Endoscopy;  Laterality: N/A;  . COLONOSCOPY WITH PROPOFOL N/A 03/31/2017   Procedure: COLONOSCOPY WITH PROPOFOL;  Surgeon: [name], MD;  [facility];  Service:  Endoscopy;  Laterality: N/A;  1:45pm  . ESOPHAGOGASTRODUODENOSCOPY  03/14/2007   [id number] antral gastritis with bulbar duodenitis/paucity to postbulbar duodenal folds and biopsy were benign with no evidence of villous atrophy.  . ESOPHAGOGASTRODUODENOSCOPY (EGD) WITH PROPOFOL N/A 02/09/2017   Procedure: ESOPHAGOGASTRODUODENOSCOPY (EGD) WITH PROPOFOL;  Surgeon: [name], MD;  [facility];  Service: Endoscopy;  Laterality: N/A;  . GIVENS CAPSULE STUDY N/A 04/08/2017   Procedure: GIVENS CAPSULE STUDY;  Surgeon: [name], MD;  [facility];  Service: Endoscopy;  Laterality: N/A;  . HAND SURGERY     left, secondary to self-inflicted laceration  . HEMORRHOID SURGERY N/A 04/14/2017   Procedure: HEMORRHOIDECTOMY;  Surgeon: [name], MD;  Location: [facility];  Service: General;  Laterality: N/A;  . SHOULDER SURGERY     right  . TOE SURGERY     left great toe , amputated-lawnmover accident    Family Psychiatric History: See below  Family History:  Family History  Problem Relation Age of Onset  . Breast cancer Mother        deceased  . Heart disease Father   . Depression Daughter   . Anxiety disorder Daughter   . Anxiety disorder Son   . Depression Son   . Asthma Brother   . Heart attack Maternal Aunt   . Heart attack Maternal Uncle   . Heart attack Paternal Aunt   . Heart attack Paternal Uncle   . Heart attack Maternal Grandmother   . Heart attack Maternal Grandfather   . Emphysema Maternal Grandfather   . Heart attack Paternal Grandmother   . Heart attack Paternal Grandfather   . Colon cancer Neg Hx   . Liver disease Neg Hx     Social History:  Social History   Socioeconomic History  . Marital status: Divorced    Spouse name: Not on file  . Number of children: 2  . Years of education: Not on file  . Highest education level: Not on file  Occupational History  . Occupation: Disabled  Tobacco Use  . Smoking status: Former Smoker     Packs/day: 1.00    Years: 40.00    Pack years: 40.00    Types: Cigarettes    Start date: 08/04/1971  . Smokeless tobacco: Never Used  . Tobacco comment: peak rate of 2.5ppd, 1/2ppd on 11/27/2016 -- 6 cigarettes / day 01/25/18  Substance and Sexual Activity  . Alcohol use: No    Alcohol/week: 0.0 standard drinks  . Drug use: Not Currently    Types: Marijuana    Comment: most days   . Sexual activity: Never  Other Topics Concern  . Not on file  Social History Narrative   Originally from [place]. Previously has lived in [facility] [place] 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  Questionable asbestos exposure. Does have significant exposure to fumes. No mold exposure. No bird exposure. No pets currently.    Social Determinants of Health   Financial Resource Strain:   . Difficulty of Paying Living Expenses: Not on file  Food Insecurity:   . Worried About Charity fundraiser in the Last Year: Not on file  . Ran Out of Food in the Last Year: Not on file  Transportation Needs:   . Lack of Transportation (Medical): Not on file  . Lack of Transportation (Non-Medical): Not on file  Physical Activity:   . Days of Exercise per Week: Not on file  . Minutes of Exercise per Session: Not on file  Stress:   . Feeling of Stress : Not on file  Social Connections:   . Frequency of Communication with Friends and Family: Not on file  . Frequency of Social Gatherings with Friends and Family: Not on file  . Attends Religious Services: Not on file  . Active Member of Clubs or Organizations: Not on file  . Attends Archivist Meetings: Not on file  . Marital Status: Not on file    Allergies:  Allergies  Allergen Reactions  . Augmentin [Amoxicillin-Pot Clavulanate] Rash and Other (See Comments)    Has patient had a PCN reaction causing immediate rash, facial/tongue/throat swelling, SOB or lightheadedness with hypotension: No Has patient  had a PCN reaction causing severe rash involving mucus membranes or skin necrosis: No Has patient had a PCN reaction that required hospitalization No Has patient had a PCN reaction occurring within the last 10 years: Yes If all of the above answers are "NO", then may proceed with Cephalosporin use.  . Ace Inhibitors Hives    Metabolic Disorder Labs: No results found for: HGBA1C, MPG No results found for: PROLACTIN Lab Results  Component Value Date   TRIG 84 02/23/2019   Lab Results  Component Value Date   TSH 1.552 05/01/2019   TSH 0.787 12/22/2018    Therapeutic Level Labs: No results found for: LITHIUM Lab Results  Component Value Date   VALPROATE 83 01/04/2019   VALPROATE 27 (L) 12/31/2018   No components found for:  CBMZ  Current Medications: Current Outpatient Medications  Medication Sig Dispense Refill  . albuterol (PROAIR HFA) 108 (90 Base) MCG/ACT inhaler Inhale 2 puffs into the lungs every 4 (four) hours as needed for wheezing or shortness of breath. 1 Inhaler 3  . albuterol (PROVENTIL) (2.5 MG/3ML) 0.083% nebulizer solution Take 3 mLs (2.5 mg total) by nebulization every 4 (four) hours as needed for shortness of breath. 120 mL 1  . aspirin EC 81 MG tablet Take 81 mg by mouth every evening.     Marland Kitchen atorvastatin (LIPITOR) 20 MG tablet Take 1 tablet (20 mg total) by mouth daily. For high cholesterol (Patient taking differently: Take 20 mg by mouth every evening. For high cholesterol) 30 tablet 0  . dextromethorphan (DELSYM) 30 MG/5ML liquid Take 30 mg by mouth 2 (two) times daily as needed for cough.    . diltiazem (CARDIZEM CD) 180 MG 24 hr capsule Take 1 capsule (180 mg total) by mouth daily. 30 capsule 0  . divalproex (DEPAKOTE ER) 500 MG 24 hr tablet Take 3 tablets (1,500 mg total) by mouth at bedtime. 90 tablet 2  . feeding supplement, ENSURE ENLIVE, (ENSURE ENLIVE) LIQD Take 237 mLs by mouth 2 (two) times daily between meals. 237 mL 12  . FLUoxetine (PROZAC) 40 MG  capsule Take 1  capsule (40 mg total) by mouth daily. For mood control 30 capsule 2  . furosemide (LASIX) 20 MG tablet Take 2 tablets (40 mg total) by mouth daily for 30 days. 60 tablet 0  . gabapentin (NEURONTIN) 400 MG capsule Take 1 capsule (400 mg total) by mouth 3 (three) times daily. 90 capsule 2  . guaiFENesin (MUCINEX) 600 MG 12 hr tablet Take 600 mg by mouth 2 (two) times daily.    Marland Kitchen. ipratropium (ATROVENT) 0.02 % nebulizer solution Take 2.5 mLs (0.5 mg total) by nebulization 3 (three) times daily. 225 mL 1  . methocarbamol (ROBAXIN) 500 MG tablet Take 1 tablet by mouth 3 (three) times daily.    . metoprolol tartrate (LOPRESSOR) 25 MG tablet Take 1 tablet (25 mg total) by mouth 2 (two) times daily. (Patient taking differently: Take 50 mg by mouth every evening. ) 60 tablet 0  . Multiple Vitamin (MULTIVITAMIN WITH MINERALS) TABS tablet Take 1 tablet by mouth daily. 30 tablet 0  . OLANZapine (ZYPREXA) 10 MG tablet Take 1.5 tablets (15 mg total) by mouth at bedtime. For mood stability 45 tablet 2  . potassium chloride SA (KLOR-CON) 20 MEQ tablet Take 1 tablet by mouth daily.    . traZODone (DESYREL) 50 MG tablet Take 2 tablets (100 mg total) by mouth at bedtime. 60 tablet 2   No current facility-administered medications for this visit.     Musculoskeletal: Strength & Muscle Tone: within normal limits Gait & Station: normal Patient leans: N/A  Psychiatric Specialty Exam: Review of Systems  Constitutional: Positive for fatigue.  Respiratory: Positive for shortness of breath.   Psychiatric/Behavioral: Positive for sleep disturbance.  All other systems reviewed and are negative.   There were no vitals taken for this visit.There is no height or weight on file to calculate BMI.  General Appearance: NA  Eye Contact:  NA  Speech:  Clear and Coherent  Volume:  Decreased  Mood:  Euthymic  Affect:  NA  Thought Process:  Goal Directed  Orientation:  Full (Time, Place, and Person)   Thought Content: WDL   Suicidal Thoughts:  No  Homicidal Thoughts:  No  Memory:  Immediate;   Good Recent;   Fair Remote;   NA  Judgement:  Fair  Insight:  Shallow  Psychomotor Activity:  Decreased  Concentration:  Concentration: Fair and Attention Span: Fair  Recall:  [employer]Fair  Fund of Knowledge: Fair  Language: Good  Akathisia:  No  Handed:  Right  AIMS (if indicated): not done  Assets:  Communication Skills Desire for Improvement Resilience Social Support  ADL's:  Intact  Cognition: WNL  Sleep:  Fair   Screenings: AIMS     [facility]  AIMS Total Score  0    AUDIT     [facility]  Alcohol Use Disorder Identification Test Final Score (AUDIT)  18       Assessment and Plan: This patient is a 58 year old male with a history of bipolar disorder and polysubstance abuse.  He also has severe COPD and is on continuous oxygen.  He is no longer using drugs or alcohol and this seems to have made a big difference in his life as well as living with his ex-wife.  He will continue Depakote ER 1500 mg at bedtime for mood stabilization, Prozac 40 mg daily for depression, gabapentin 400 mg 3 times daily for anxiety, olanzapine  15 mg at bedtime for mood stabilization and trazodone 100 mg at bedtime for sleep.  I have instructed his wife to try to keep him from sleeping so much during the day.  He will return to see me in 3 months   Diannia Ruder, MD 11/14/2019, 11:08 AM

## 2019-11-21 ENCOUNTER — Emergency Department (HOSPITAL_COMMUNITY): Payer: Medicare Other

## 2019-11-21 ENCOUNTER — Emergency Department (HOSPITAL_COMMUNITY)
Admission: EM | Admit: 2019-11-21 | Discharge: 2019-11-21 | Disposition: A | Discharge status: Home / Self Care | Payer: Medicare Other | Attending: Emergency Medicine | Admitting: Emergency Medicine

## 2019-11-21 ENCOUNTER — Other Ambulatory Visit: Payer: Self-pay

## 2019-11-21 ENCOUNTER — Encounter (HOSPITAL_COMMUNITY): Payer: Self-pay | Admitting: Emergency Medicine

## 2019-11-21 DIAGNOSIS — Z87891 Personal history of nicotine dependence: Secondary | ICD-10-CM | POA: Diagnosis not present

## 2019-11-21 DIAGNOSIS — J449 Chronic obstructive pulmonary disease, unspecified: Secondary | ICD-10-CM | POA: Insufficient documentation

## 2019-11-21 DIAGNOSIS — Z7982 Long term (current) use of aspirin: Secondary | ICD-10-CM | POA: Insufficient documentation

## 2019-11-21 DIAGNOSIS — R0602 Shortness of breath: Secondary | ICD-10-CM | POA: Diagnosis present

## 2019-11-21 DIAGNOSIS — G9340 Encephalopathy, unspecified: Secondary | ICD-10-CM | POA: Diagnosis not present

## 2019-11-21 DIAGNOSIS — I110 Hypertensive heart disease with heart failure: Secondary | ICD-10-CM | POA: Insufficient documentation

## 2019-11-21 DIAGNOSIS — I5032 Chronic diastolic (congestive) heart failure: Secondary | ICD-10-CM | POA: Insufficient documentation

## 2019-11-21 DIAGNOSIS — Z79899 Other long term (current) drug therapy: Secondary | ICD-10-CM | POA: Diagnosis not present

## 2019-11-21 LAB — CBC
HCT: 40.3 % (ref 39.0–52.0)
Hemoglobin: 12.6 g/dL — ABNORMAL LOW (ref 13.0–17.0)
MCH: 27.9 pg (ref 26.0–34.0)
MCHC: 31.3 g/dL (ref 30.0–36.0)
MCV: 89.4 fL (ref 80.0–100.0)
Platelets: 338 10*3/uL (ref 150–400)
RBC: 4.51 MIL/uL (ref 4.22–5.81)
RDW: 11.9 % (ref 11.5–15.5)
WBC: 6.6 10*3/uL (ref 4.0–10.5)
nRBC: 0 % (ref 0.0–0.2)

## 2019-11-21 LAB — URINALYSIS, ROUTINE W REFLEX MICROSCOPIC
Bilirubin Urine: NEGATIVE
Glucose, UA: NEGATIVE mg/dL
Hgb urine dipstick: NEGATIVE
Ketones, ur: NEGATIVE mg/dL
Leukocytes,Ua: NEGATIVE
Nitrite: NEGATIVE
Protein, ur: NEGATIVE mg/dL
Specific Gravity, Urine: 1.016 (ref 1.005–1.030)
pH: 8 (ref 5.0–8.0)

## 2019-11-21 LAB — PROTIME-INR
INR: 1 (ref 0.8–1.2)
Prothrombin Time: 12.7 seconds (ref 11.4–15.2)

## 2019-11-21 LAB — BLOOD GAS, ARTERIAL
Acid-Base Excess: 19.4 mmol/L — ABNORMAL HIGH (ref 0.0–2.0)
Bicarbonate: 41.5 mmol/L — ABNORMAL HIGH (ref 20.0–28.0)
FIO2: 21
O2 Saturation: 78.7 %
Patient temperature: 36.6
pCO2 arterial: 63.4 mmHg — ABNORMAL HIGH (ref 32.0–48.0)
pH, Arterial: 7.463 — ABNORMAL HIGH (ref 7.350–7.450)
pO2, Arterial: 43.5 mmHg — ABNORMAL LOW (ref 83.0–108.0)

## 2019-11-21 LAB — RAPID URINE DRUG SCREEN, HOSP PERFORMED
Amphetamines: NOT DETECTED
Barbiturates: NOT DETECTED
Benzodiazepines: NOT DETECTED
Cocaine: NOT DETECTED
Opiates: NOT DETECTED
Tetrahydrocannabinol: NOT DETECTED

## 2019-11-21 LAB — BASIC METABOLIC PANEL
Anion gap: 13 (ref 5–15)
BUN: 12 mg/dL (ref 6–20)
CO2: 40 mmol/L — ABNORMAL HIGH (ref 22–32)
Calcium: 9.3 mg/dL (ref 8.9–10.3)
Chloride: 82 mmol/L — ABNORMAL LOW (ref 98–111)
Creatinine, Ser: 0.53 mg/dL — ABNORMAL LOW (ref 0.61–1.24)
GFR calc Af Amer: >60 mL/min (ref >60)
GFR calc non Af Amer: >60 mL/min (ref >60)
Glucose, Bld: 100 mg/dL — ABNORMAL HIGH (ref 70–99)
Potassium: 4.1 mmol/L (ref 3.5–5.1)
Sodium: 135 mmol/L (ref 135–145)

## 2019-11-21 LAB — ETHANOL: Alcohol, Ethyl (B): <10 mg/dL (ref <10)

## 2019-11-21 LAB — AMMONIA: Ammonia: 46 umol/L — ABNORMAL HIGH (ref 9–35)

## 2019-11-21 MED ORDER — ALBUTEROL SULFATE HFA 108 (90 BASE) MCG/ACT IN AERS
2.0000 | INHALATION_SPRAY | RESPIRATORY_TRACT | Status: AC
  Administered 2019-11-21: 2 via RESPIRATORY_TRACT
  Filled 2019-11-21: qty 6.7

## 2019-11-21 MED ORDER — ALBUTEROL SULFATE HFA 108 (90 BASE) MCG/ACT IN AERS: 2.0000 | INHALATION_SPRAY | RESPIRATORY_TRACT | 1 refills | Status: DC | PRN

## 2019-11-21 MED ORDER — PREDNISONE 20 MG PO TABS: 40.0000 mg | ORAL_TABLET | Freq: Every day | ORAL | 0 refills | Status: DC

## 2019-11-21 NOTE — ED Notes (Signed)
Patient transported to CT 

## 2019-11-21 NOTE — Discharge Instructions (Signed)
Take prednisone daily for 5 days Albuterol 2 puffs every 4 hours as needed or use your albuterol nebulizer every 4 hours as needed Emergency department for severe or worsening symptoms The rest of your testing shows only that you have chronic COPD, if you become more short of breath, confused or any other worsening symptoms please come back. You have requested to be discharged and refused to have any further testing.  You may return at anytime if you change your mind

## 2019-11-21 NOTE — ED Notes (Signed)
ED Provider at bedside. 

## 2019-11-21 NOTE — ED Triage Notes (Signed)
Pt reports hallucinations, generalized tremors x3 days. Pt reports is on 5liters of home o2. Pt reports called EMS last night due to shortness of breath. Pt reports to ED today without oxygen. 5liters applied. Pt O2 saturation 99%. Moderate dyspnea noted at this time.

## 2019-11-21 NOTE — ED Provider Notes (Addendum)
[facility] EMERGENCY DEPARTMENT Provider Note   CSN: [id number] Arrival date & time: 11/21/19  1434     History Chief Complaint  Patient presents with   Shortness of Breath    Jeffrey [name] is a 58 y.o. male.  HPI   This patient is a 58 year old male, he has a known history of congestive heart failure as well as COPD and bipolar disorder.  He states that he uses a oxygen concentrator at home and when he woke up he found that he did not have his oxygen mask and tubing.  He could not find it, he became more more short of breath and then started to hallucinate.  He called for his wife, was brought to the hospital and now that he has oxygen on he feels like he is back to his normal self.  He has no increased shortness of breath, no coughing, no fevers, no swelling, he has a persistent severe wheeze which is normal for him and he uses 3-4 and sometimes 5 L by nasal cannula chronically.  He denies any recent illnesses or exposure to coronavirus.  He has not had increased amounts of phlegm and he denies any fevers or swelling of the legs.    Review of the medical record shows that the patient had been admitted to the hospital in May for hospital-acquired pneumonia, he had been admitted in March for the same and in January was admitted for hepatic encephalopathy.  Most recently the patient was admitted in November for COPD exacerbation  Past Medical History:  Diagnosis Date   Acute blood loss anemia 02/07/2017   Anxiety    Arthritis    deg disease, bulging disk,  shoulder level   Bipolar disorder (HCC)    Bipolar disorder (HCC)    CHF (congestive heart failure) (HCC)    COPD, severe (HCC) 10/09/2016   Depression    anxiety   Hyperlipidemia    Hypertension    Peptic ulcer disease    Review   Pneumonia    [name] (peripheral vascular disease) (HCC) 06/18/2015   Shortness of breath     Patient Active Problem List   Diagnosis Date Noted   COPD with acute exacerbation  (HCC) 10/12/2019   Respiratory failure with hypoxia (HCC) 04/30/2019   Acute on chronic respiratory failure with hypoxia (HCC) 04/30/2019   Bronchiectasis (HCC) 03/24/2019   Fever 03/24/2019   Cough    Dysphagia    Palliative care by specialist    Goals of care, counseling/discussion    Hepatic encephalopathy (HCC)    COPD exacerbation (HCC) 09/16/2018   Essential hypertension 09/16/2018   GERD (gastroesophageal reflux disease) 09/16/2018   Hypoxia    Chronic diastolic CHF (congestive heart failure) (HCC)    Bipolar disorder with severe depression (HCC) 02/01/2018   Alcohol use disorder, severe, dependence (HCC) 02/01/2018   Cannabis use disorder, severe, dependence (HCC) 02/01/2018   MDD (major depressive disorder), recurrent episode, severe (HCC) 01/31/2018   Internal and external bleeding hemorrhoids    Rectal bleeding    Iron deficiency anemia due to chronic blood loss 03/09/2017   Anemia    GIB (gastrointestinal bleeding) 02/07/2017   Peptic ulcer disease    Bipolar disorder (HCC)    COPD, severe (HCC) 10/09/2016   H/O: depression 08/26/2016   Dyslipidemia 08/26/2016   Tobacco use disorder 08/26/2016   [name] (peripheral vascular disease) (HCC) 06/18/2015   Chronic diarrhea 05/02/2013   Hematochezia 05/02/2013   Abnormal weight loss 05/02/2013  Abdominal pain, periumbilical 78/24/2353    Past Surgical History:  Procedure Laterality Date   BIOPSY  02/09/2017   Procedure: BIOPSY;  Surgeon: Danie Binder, MD;  Location: [facility];  Service: Endoscopy;;  duodenal gastric   COLONOSCOPY  03/14/2007   [id number] colonoscopy and terminal ileoscopy except external hemorrhoids   COLONOSCOPY N/A 05/05/2013   Dr. Gala Romney: external/internal anal canal hemorrhoids, unable to intubate TI, segemental biopsies unremarkable    COLONOSCOPY N/A 04/11/2017   Procedure: COLONOSCOPY;  Surgeon: Danie Binder, MD;  Location: [facility];  Service:  Endoscopy;  Laterality: N/A;   COLONOSCOPY WITH PROPOFOL N/A 03/31/2017   Procedure: COLONOSCOPY WITH PROPOFOL;  Surgeon: Daneil Dolin, MD;  Location: [facility];  Service: Endoscopy;  Laterality: N/A;  1:45pm   ESOPHAGOGASTRODUODENOSCOPY  03/14/2007   [id number] antral gastritis with bulbar duodenitis/paucity to postbulbar duodenal folds and biopsy were benign with no evidence of villous atrophy.   ESOPHAGOGASTRODUODENOSCOPY (EGD) WITH PROPOFOL N/A 02/09/2017   Procedure: ESOPHAGOGASTRODUODENOSCOPY (EGD) WITH PROPOFOL;  Surgeon: Danie Binder, MD;  Location: [facility];  Service: Endoscopy;  Laterality: N/A;   GIVENS CAPSULE STUDY N/A 04/08/2017   Procedure: GIVENS CAPSULE STUDY;  Surgeon: Daneil Dolin, MD;  Location: [facility];  Service: Endoscopy;  Laterality: N/A;   HAND SURGERY     left, secondary to self-inflicted laceration   HEMORRHOID SURGERY N/A 04/14/2017   Procedure: HEMORRHOIDECTOMY;  Surgeon: Aviva Signs, MD;  Location: [facility];  Service: General;  Laterality: N/A;   SHOULDER SURGERY     right   TOE SURGERY     left great toe , amputated-lawnmover accident       Family History  Problem Relation Age of Onset   Breast cancer Mother        deceased   Heart disease Father    Depression Daughter    Anxiety disorder Daughter    Anxiety disorder Son    Depression Son    Asthma Brother    Heart attack Maternal Aunt    Heart attack Maternal Uncle    Heart attack Paternal Aunt    Heart attack Paternal Uncle    Heart attack Maternal Grandmother    Heart attack Maternal Grandfather    Emphysema Maternal Grandfather    Heart attack Paternal Grandmother    Heart attack Paternal Grandfather    Colon cancer Neg Hx    Liver disease Neg Hx     Social History   Tobacco Use   Smoking status: Former Smoker    Packs/day: 1.00    Years: 40.00    Pack years: 40.00    Types: Cigarettes    Start date: 08/04/1971   Smokeless tobacco:  Never Used   Tobacco comment: peak rate of 2.5ppd, 1/2ppd on 11/27/2016 -- 6 cigarettes / day 01/25/18  Substance Use Topics   Alcohol use: No    Alcohol/week: 0.0 standard drinks   Drug use: Not Currently    Types: Marijuana    Comment: most days     Home Medications Prior to Admission medications   Medication Sig Start Date End Date Taking? Authorizing Provider  albuterol (PROVENTIL) (2.5 MG/3ML) 0.083% nebulizer solution Take 3 mLs (2.5 mg total) by nebulization every 4 (four) hours as needed for shortness of breath. 01/13/19   Mikhail, Velta Addison, DO  albuterol (VENTOLIN HFA) 108 (90 Base) MCG/ACT inhaler Inhale 2 puffs into the lungs every 4 (four) hours as needed for wheezing or shortness of breath.  11/21/19   Eber HongMiller, Thelma Viana, MD  aspirin EC 81 MG tablet Take 81 mg by mouth every evening.     [provider]  atorvastatin (LIPITOR) 20 MG tablet Take 1 tablet (20 mg total) by mouth daily. For high cholesterol Patient taking differently: Take 20 mg by mouth every evening. For high cholesterol 02/09/18   Money, Gerlene Burdockravis B, FNP  dextromethorphan (DELSYM) 30 MG/5ML liquid Take 30 mg by mouth 2 (two) times daily as needed for cough.    [provider]  diltiazem (CARDIZEM CD) 180 MG 24 hr capsule Take 1 capsule (180 mg total) by mouth daily. 03/08/19   Rhetta MuraSamtani, Jai-Gurmukh, MD  divalproex (DEPAKOTE ER) 500 MG 24 hr tablet Take 3 tablets (1,500 mg total) by mouth at bedtime. 11/14/19   Myrlene Brokeross, Deborah R, MD  feeding supplement, ENSURE ENLIVE, (ENSURE ENLIVE) LIQD Take 237 mLs by mouth 2 (two) times daily between meals. 03/30/19   Sherryll BurgerShah, Pratik D, DO  FLUoxetine (PROZAC) 40 MG capsule Take 1 capsule (40 mg total) by mouth daily. For mood control 11/14/19   Myrlene Brokeross, Deborah R, MD  furosemide (LASIX) 20 MG tablet Take 2 tablets (40 mg total) by mouth daily for 30 days. 05/02/19 10/12/19  Sherryll BurgerShah, Pratik D, DO  gabapentin (NEURONTIN) 400 MG capsule Take 1 capsule (400 mg total) by mouth 3 (three) times  daily. 11/14/19   Myrlene Brokeross, Deborah R, MD  guaiFENesin (MUCINEX) 600 MG 12 hr tablet Take 600 mg by mouth 2 (two) times daily.    [provider]  ipratropium (ATROVENT) 0.02 % nebulizer solution Take 2.5 mLs (0.5 mg total) by nebulization 3 (three) times daily. 01/13/19   Mikhail, Nita SellsMaryann, DO  methocarbamol (ROBAXIN) 500 MG tablet Take 1 tablet by mouth 3 (three) times daily. 03/16/19   [provider]  metoprolol tartrate (LOPRESSOR) 25 MG tablet Take 1 tablet (25 mg total) by mouth 2 (two) times daily. Patient taking differently: Take 50 mg by mouth every evening.  03/07/19   Rhetta MuraSamtani, Jai-Gurmukh, MD  Multiple Vitamin (MULTIVITAMIN WITH MINERALS) TABS tablet Take 1 tablet by mouth daily. 01/13/19   Mikhail, Nita SellsMaryann, DO  OLANZapine (ZYPREXA) 10 MG tablet Take 1.5 tablets (15 mg total) by mouth at bedtime. For mood stability 11/14/19   Myrlene Brokeross, Deborah R, MD  potassium chloride SA (KLOR-CON) 20 MEQ tablet Take 1 tablet by mouth daily. 07/17/19   [provider]  predniSONE (DELTASONE) 20 MG tablet Take 2 tablets (40 mg total) by mouth daily. 11/21/19   Eber HongMiller, Lillias Difrancesco, MD  traZODone (DESYREL) 50 MG tablet Take 2 tablets (100 mg total) by mouth at bedtime. 11/14/19   Myrlene Brokeross, Deborah R, MD    Allergies    Augmentin [amoxicillin-pot clavulanate] and Ace inhibitors  Review of Systems   Review of Systems  Physical Exam Updated Vital Signs BP 120/68    Pulse 84    Temp 98.6 F (37 C) (Oral)    Resp (!) 22    Ht 1.803 m (5\' 11" )    Wt 70.3 kg    SpO2 99%    BMI 21.62 kg/m   Physical Exam Vitals and nursing note reviewed.  Constitutional:      General: He is not in acute distress.    Appearance: He is well-developed.  HENT:     Head: Normocephalic and atraumatic.     Mouth/Throat:     Pharynx: No oropharyngeal exudate.  Eyes:     General: No scleral icterus.  Right eye: No discharge.        Left eye: No discharge.     Conjunctiva/sclera: Conjunctivae normal.      Pupils: Pupils are equal, round, and reactive to light.  Neck:     Thyroid: No thyromegaly.     Vascular: No JVD.  Cardiovascular:     Rate and Rhythm: Normal rate and regular rhythm.     Heart sounds: Normal heart sounds. No murmur. No friction rub. No gallop.   Pulmonary:     Effort: No respiratory distress.     Breath sounds: Wheezing present. No rales.     Comments: The patient speaks in just short and sentences, he has a significant expiratory wheeze with a prolonged expiratory phase, decreased lung sounds posteriorly.  No rales, no accessory muscle use Abdominal:     General: Bowel sounds are normal. There is no distension.     Palpations: Abdomen is soft. There is no mass.     Tenderness: There is no abdominal tenderness.  Musculoskeletal:        General: No tenderness. Normal range of motion.     Cervical back: Normal range of motion and neck supple.  Lymphadenopathy:     Cervical: No cervical adenopathy.  Skin:    General: Skin is warm and dry.     Findings: No erythema or rash.  Neurological:     Mental Status: He is alert.     Coordination: Coordination normal.  Psychiatric:        Behavior: Behavior normal.     ED Results / Procedures / Treatments   Labs (all labs ordered are listed, but only abnormal results are displayed) Labs Reviewed  BASIC METABOLIC PANEL - Abnormal; Notable for the following components:      Result Value   Chloride 82 (*)    CO2 40 (*)    Glucose, Bld 100 (*)    Creatinine, Ser 0.53 (*)    All other components within normal limits  CBC - Abnormal; Notable for the following components:   Hemoglobin 12.6 (*)    All other components within normal limits  AMMONIA - Abnormal; Notable for the following components:   Ammonia 46 (*)    All other components within normal limits  URINALYSIS, ROUTINE W REFLEX MICROSCOPIC - Abnormal; Notable for the following components:   APPearance HAZY (*)    All other components within normal limits  BLOOD  GAS, ARTERIAL - Abnormal; Notable for the following components:   pH, Arterial 7.463 (*)    pCO2 arterial 63.4 (*)    pO2, Arterial 43.5 (*)    Bicarbonate 41.5 (*)    Acid-Base Excess 19.4 (*)    All other components within normal limits  ETHANOL  PROTIME-INR  RAPID URINE DRUG SCREEN, HOSP PERFORMED    EKG EKG Interpretation  Date/Time:  Tuesday November 21 2019 14:59:38 EST Ventricular Rate:  87 PR Interval:    QRS Duration: 74 QT Interval:  356 QTC Calculation: 428 R Axis:   74 Text Interpretation: Accelerated Junctional rhythm Minimal voltage criteria for LVH, may be normal variant Early repolarization Abnormal ECG The electilacl noise / undulating baseline makes this EKG virtually unreadable. Confirmed by Eber Hong ([id number]) on 11/21/2019 3:05:05 PM   Radiology DG Chest 2 View  Result Date: 11/21/2019 CLINICAL DATA:  Shortness of breath. COPD. EXAM: CHEST - 2 VIEW COMPARISON:  October 15, 2019 FINDINGS: The lungs are hyperexpanded consistent with the patient's history of COPD. There is no focal  infiltrate. No large pleural effusion. The heart size is normal. There is scarring versus atelectasis at the left lung base. IMPRESSION: 1. No active cardiopulmonary disease. 2. Findings consistent with COPD. Electronically Signed   By: Katherine Mantle M.D.   On: 11/21/2019 15:47   CT Head Wo Contrast  Result Date: 11/21/2019 CLINICAL DATA:  Encephalopathy. Additional history provided: Patient reports hallucinations, generalized tremors for 3 days. EXAM: CT HEAD WITHOUT CONTRAST TECHNIQUE: Contiguous axial images were obtained from the base of the skull through the vertex without intravenous contrast. COMPARISON:  Head CT 12/31/2018, brain MRI 12/22/2018 FINDINGS: Brain: No evidence of acute intracranial hemorrhage. No demarcated cortical infarction. No evidence of intracranial mass. No midline shift or extra-axial fluid collection. Redemonstrated prominent perivascular space  within the inferior right basal ganglia. Cerebral volume is normal for age. Vascular: No hyperdense vessel.  Atherosclerotic calcifications. Skull: Normal. Negative for fracture or focal lesion. Sinuses/Orbits: Chronic deformity of the left lamina papyracea. No significant paranasal sinus disease or mastoid effusion at the imaged levels. IMPRESSION: No evidence of acute intracranial abnormality. Electronically Signed   By: Jackey Loge DO   On: 11/21/2019 17:32    Procedures Procedures (including critical care time)  Medications Ordered in ED Medications  albuterol (VENTOLIN HFA) 108 (90 Base) MCG/ACT inhaler 2 puff (2 puffs Inhalation Given 11/21/19 1528)    ED Course  I have reviewed the triage vital signs and the nursing notes.  Pertinent labs & imaging results that were available during my care of the patient were reviewed by me and considered in my medical decision making (see chart for details).  Clinical Course as of Nov 20 1857  Tue Nov 21, 2019  1547 I have personally viewed and interpreted the 2 view PA and lateral chest x-ray which shows bilateral clear lung fields with some interstitial markings, rather flattened diaphragms from hyperinflation consistent with COPD.  No focal infiltrate, possible slight effusion on the left, no pneumothorax or abnormal mediastinum.   [BM]  1548 Lab work thus far shows no leukocytosis or significant anemia, the patient was reevaluated 30 minutes after initial evaluation and still remains in a normal mental status with a 99% oxygen on his nasal cannula.  States that he is called his wife on the phone and she has been able to find his equipment at home   [BM]  1658 The patient spouse is now showing up and states that he has not been acting like his normal self for the last several days doing strange things like putting his necklace in his nose (the cross and).  He has not been making sense occasionally saying things that do not make sense.  This is  certainly injected position to what I am seeing where the gentleman is answering my questions appropriately, has vital signs which are unremarkable, has a chest x-ray which shows no acute infiltrates lab work which is unremarkable except for his ammonia of 46 which is only minimally elevated   [BM]  1659 We will check an ABG to make sure he is not hypercapnic though the patient appears well, may also need a CT scan to rule out stroke   [BM]  1833 The patient spouse states that he has been drinking booze and [facility], not drinking much water, not eating very much in the last couple of days but does not have a history of alcoholism, seizures or withdrawal.  He has not been diaphoretic vomiting or having diarrhea.  The patient is awake alert  and does not want any more testing.  CT scan of the head is negative, labs show some hypercapnia but given his smoking history of COPD I suspect he is close to baseline.  He has normal renal function with a creatinine of 0.5 and a BUN of 12, no detectable alcohol in the blood, urinalysis which is clean and an ABG without any signs of respiratory acidosis.  Vital signs remain normal   [BM]    Clinical Course User Index [BM] Eber Hong, MD   MDM Rules/Calculators/A&P                      At this time the patient is mentating appropriately, answering all my questions, he is able to perform all the actions that I ask, is able to tell me a very clear history however given his history of hepatic encephalopathy as well as COPD and pneumonia we will check an x-ray to make sure he has not developed pneumonia and an ammonia level as well.  After the rest of the labs resulted it was clear that the patient had no signs of urinary tract infection, no signs of dehydration, no abnormal findings on his drug screen, alcohol test was negative, ABG showed the expected mild hypercapnic findings.  I have reevaluated the patient multiple times and he continues to have a normal  mental status.  He is answering my questions appropriately, states that he does not want to be in emergency department, he does not want to stay for anymore testing and he does not want to be admitted to the hospital.  At this time he appears to have appropriate medical decision-making capacity.  Additionally the patient has not had any witnessed hypoxia or distress since arrival.  ROWEN WILMER was evaluated in Emergency Department on 11/21/2019 for the symptoms described in the history of present illness. He was evaluated in the context of the global COVID-19 pandemic, which necessitated consideration that the patient might be at risk for infection with the SARS-CoV-2 virus that causes COVID-19. Institutional protocols and algorithms that pertain to the evaluation of patients at risk for COVID-19 are in a state of rapid change based on information released by regulatory bodies including the CDC and federal and state organizations. These policies and algorithms were followed during the patient's care in the ED.   Final Clinical Impression(s) / ED Diagnoses Final diagnoses:  Shortness of breath    Rx / DC Orders ED Discharge Orders         Ordered    albuterol (VENTOLIN HFA) 108 (90 Base) MCG/ACT inhaler  Every 4 hours PRN     11/21/19 1857    predniSONE (DELTASONE) 20 MG tablet  Daily     11/21/19 1857           Eber Hong, MD 11/21/19 Bennie Hind    Eber Hong, MD 11/21/19 1859

## 2019-11-21 NOTE — ED Notes (Signed)
Patient is alert and oriented and cooperative with staff.

## 2019-11-28 ENCOUNTER — Encounter: Payer: Self-pay | Admitting: *Deleted

## 2019-11-28 ENCOUNTER — Telehealth: Payer: Self-pay

## 2019-11-28 NOTE — Telephone Encounter (Signed)
Virtual Visit Pre-Appointment Phone Call  "(Name), I am calling you today to discuss your upcoming appointment. We are currently trying to limit exposure to the virus that causes COVID-19 by seeing patients at home rather than in the office."  "What is the BEST phone number to call the day of the visit?" - include this in appointment notes  "Do you have or have access to (through a family member/friend) a smartphone with video capability that we can use for your visit?" If yes - list this number in appt notes as "cell" (if different from BEST phone #) and list the appointment type as a VIDEO visit in appointment notes If no - list the appointment type as a PHONE visit in appointment notes  Confirm consent - "In the setting of the current Covid19 crisis, you are scheduled for a (phone or video) visit with your provider on (date) at (time).  Just as we do with many in-office visits, in order for you to participate in this visit, we must obtain consent.  If you'd like, I can send this to your mychart (if signed up) or email for you to review.  Otherwise, I can obtain your verbal consent now.  All virtual visits are billed to your insurance company just like a normal visit would be.  By agreeing to a virtual visit, we'd like you to understand that the technology does not allow for your provider to perform an examination, and thus may limit your provider's ability to fully assess your condition. If your provider identifies any concerns that need to be evaluated in person, we will make arrangements to do so.  Finally, though the technology is pretty good, we cannot assure that it will always work on either your or our end, and in the setting of a video visit, we may have to convert it to a phone-only visit.  In either situation, we cannot ensure that we have a secure connection.  Are you willing to proceed?" STAFF: Did the patient verbally acknowledge consent to telehealth visit? Document YES/NO here:    Advise patient to be prepared - "Two hours prior to your appointment, go ahead and check your blood pressure, pulse, oxygen saturation, and your weight (if you have the equipment to check those) and write them all down. When your visit starts, your provider will ask you for this information. If you have an Apple Watch or Kardia device, please plan to have heart rate information ready on the day of your appointment. Please have a pen and paper handy nearby the day of the visit as well."  Give patient instructions for MyChart download to smartphone OR Doximity/Doxy.me as below if video visit (depending on what platform provider is using)  Inform patient they will receive a phone call 15 minutes prior to their appointment time (may be from unknown caller ID) so they should be prepared to answer    TELEPHONE CALL NOTE  Jeffrey [name] has been deemed a candidate for a follow-up tele-health visit to limit community exposure during the Covid-19 pandemic. I spoke with the patient via phone to ensure availability of phone/video source, confirm preferred email & phone number, and discuss instructions and expectations.  I reminded Jeffrey [name] to be prepared with any vital sign and/or heart rhythm information that could potentially be obtained via home monitoring, at the time of his visit. I reminded Jeffrey [name] to expect a phone call prior to his visit.  Gracy Bruins 11/28/2019 10:41 AM  INSTRUCTIONS FOR DOWNLOADING THE MYCHART APP TO SMARTPHONE  - The patient must first make sure to have activated MyChart and know their login information - If Apple, go to [employer] and type in MyChart in the search bar and download the app. If Android, ask patient to go to [employer] and type in [place] in the search bar and download the app. The app is free but as with any other app downloads, their phone may require them to verify saved payment information or Apple/Android password.  - The patient  will need to then log into the app with their MyChart username and password, and select [facility] as their healthcare provider to link the account. When it is time for your visit, go to the MyChart app, find appointments, and click Begin Video Visit. Be sure to Select Allow for your device to access the Microphone and Camera for your visit. You will then be connected, and your provider will be with you shortly.  **If they have any issues connecting, or need assistance please contact MyChart service desk [phone])**  **If using a computer, in order to ensure the best quality for their visit they will need to use either of the following Internet Browsers: [employer], or Google Chrome**  IF USING DOXIMITY or DOXY.ME - The patient will receive a link just prior to their visit by text.     FULL LENGTH CONSENT FOR TELE-HEALTH VISIT   I hereby voluntarily request, consent and authorize [facility] and its employed or contracted physicians, physician assistants, nurse practitioners or other licensed health care professionals (the Practitioner), to provide me with telemedicine health care services (the "Services") as deemed necessary by the treating Practitioner. I acknowledge and consent to receive the Services by the Practitioner via telemedicine. I understand that the telemedicine visit will involve communicating with the Practitioner through live audiovisual communication technology and the disclosure of certain medical information by electronic transmission. I acknowledge that I have been given the opportunity to request an in-person assessment or other available alternative prior to the telemedicine visit and am voluntarily participating in the telemedicine visit.  I understand that I have the right to withhold or withdraw my consent to the use of telemedicine in the course of my care at any time, without affecting my right to future care or treatment, and that the Practitioner  or I may terminate the telemedicine visit at any time. I understand that I have the right to inspect all information obtained and/or recorded in the course of the telemedicine visit and may receive copies of available information for a reasonable fee.  I understand that some of the potential risks of receiving the Services via telemedicine include:  Delay or interruption in medical evaluation due to technological equipment failure or disruption; Information transmitted may not be sufficient (e.g. poor resolution of images) to allow for appropriate medical decision making by the Practitioner; and/or  In rare instances, security protocols could fail, causing a breach of personal health information.  Furthermore, I acknowledge that it is my responsibility to provide information about my medical history, conditions and care that is complete and accurate to the best of my ability. I acknowledge that Practitioner's advice, recommendations, and/or decision may be based on factors not within their control, such as incomplete or inaccurate data provided by me or distortions of diagnostic images or specimens that may result from electronic transmissions. I understand that the practice of medicine is not an exact science and that Practitioner  makes no warranties or guarantees regarding treatment outcomes. I acknowledge that I will receive a copy of this consent concurrently upon execution via email to the email address I last provided but may also request a printed copy by calling the office of [facility].    I understand that my insurance will be billed for this visit.   I have read or had this consent read to me. I understand the contents of this consent, which adequately explains the benefits and risks of the Services being provided via telemedicine.  I have been provided ample opportunity to ask questions regarding this consent and the Services and have had my questions answered to my satisfaction. I give  my informed consent for the services to be provided through the use of telemedicine in my medical care  By participating in this telemedicine visit I agree to the above.

## 2019-11-29 ENCOUNTER — Encounter: Payer: Self-pay | Admitting: Cardiovascular Disease

## 2019-11-29 ENCOUNTER — Telehealth (INDEPENDENT_AMBULATORY_CARE_PROVIDER_SITE_OTHER): Payer: Medicare Other | Admitting: Cardiovascular Disease

## 2019-11-29 VITALS — BP 123/80 | HR 93 | Ht 70.0 in | Wt 150.9 lb

## 2019-11-29 DIAGNOSIS — R931 Abnormal findings on diagnostic imaging of heart and coronary circulation: Secondary | ICD-10-CM | POA: Diagnosis not present

## 2019-11-29 DIAGNOSIS — I25118 Atherosclerotic heart disease of native coronary artery with other forms of angina pectoris: Secondary | ICD-10-CM

## 2019-11-29 DIAGNOSIS — I5032 Chronic diastolic (congestive) heart failure: Secondary | ICD-10-CM | POA: Diagnosis not present

## 2019-11-29 DIAGNOSIS — I10 Essential (primary) hypertension: Secondary | ICD-10-CM | POA: Diagnosis not present

## 2019-11-29 NOTE — Progress Notes (Signed)
Virtual Visit via Telephone Note   This visit type was conducted due to national recommendations for restrictions regarding the COVID-19 Pandemic (e.g. social distancing) in an effort to limit this patient's exposure and mitigate transmission in our community.  Due to his co-morbid illnesses, this patient is at least at moderate risk for complications without adequate follow up.  This format is felt to be most appropriate for this patient at this time.  The patient did not have access to video technology/had technical difficulties with video requiring transitioning to audio format only (telephone).  All issues noted in this document were discussed and addressed.  No physical exam could be performed with this format.  Please refer to the patient's chart for his  consent to telehealth for [facility].   Date:  11/29/2019   ID:  Jeffrey [name], DOB [birth year], MRN [id number]  Patient Location: Home Provider Location: Home  PCP:  Gareth Morgan, MD  Cardiologist:  Prentice Docker, MD  Electrophysiologist:  None   Evaluation Performed:  New Patient Evaluation  Chief Complaint: Chronic diastolic heart failure  History of Present Illness:    Jeffrey [name] is a 59 y.o. male with chronic diastolic heart failure, advanced COPD and history of tobacco use.  I last personally evaluated him in November 2017.  At that time he had been complaining of shortness of breath and fatigue.  Nuclear stress test 08/10/16 showed small prior myocardial infarction in the basal septum with mild to moderate sized apical infarct with mild peri-infarct ischemia. It was deemed a low risk study. Left ventricle systolic function was normal.  He was hospitalized for a COPD exacerbation in November 2020.  He was then evaluated in the ED for shortness of breath on 11/21/2019.  He is anywhere between 3 and 5 L of oxygen by nasal cannula chronically.  He denies chest pain.  Chronic exertional dyspnea appears to be  stable.   Past Medical History:  Diagnosis Date  . Acute blood loss anemia 02/07/2017  . Anxiety   . Arthritis    deg disease, bulging disk,  shoulder level  . Bipolar disorder (HCC)   . Bipolar disorder (HCC)   . CHF (congestive heart failure) (HCC)   . 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 Depression    anxiety  . Hyperlipidemia   . Hypertension   . Peptic ulcer disease    Review  . Pneumonia   . [name] (peripheral vascular disease) (HCC) 06/18/2015  . Shortness of breath    Past Surgical History:  Procedure Laterality Date  . BIOPSY  02/09/2017   Procedure: BIOPSY;  Surgeon: [name], MD;  [facility];  Service: Endoscopy;;  duodenal gastric  . COLONOSCOPY  03/14/2007   [facility]:BOFBPZ colonoscopy and terminal ileoscopy except external hemorrhoids  . COLONOSCOPY N/A 05/05/2013   [name]: external/internal anal canal hemorrhoids, unable to intubate TI, segemental biopsies unremarkable   . COLONOSCOPY N/A 04/11/2017   Procedure: COLONOSCOPY;  Surgeon: [name], MD;  [facility];  Service: Endoscopy;  Laterality: N/A;  . COLONOSCOPY WITH PROPOFOL N/A 03/31/2017   Procedure: COLONOSCOPY WITH PROPOFOL;  Surgeon: [name], MD;  [facility];  Service: Endoscopy;  Laterality: N/A;  1:45pm  . ESOPHAGOGASTRODUODENOSCOPY  03/14/2007   [facility]:ENIDPOEUMP antral gastritis with bulbar duodenitis/paucity to postbulbar duodenal folds and biopsy were benign with no evidence of villous atrophy.  . ESOPHAGOGASTRODUODENOSCOPY (EGD) WITH PROPOFOL N/A 02/09/2017   Procedure: ESOPHAGOGASTRODUODENOSCOPY (EGD) WITH  PROPOFOL;  Surgeon: [name], MD;  [facility];  Service: Endoscopy;  Laterality: N/A;  . GIVENS CAPSULE STUDY N/A 04/08/2017   Procedure: GIVENS CAPSULE STUDY;  Surgeon: [name], MD;  [facility];  Service: Endoscopy;  Laterality: N/A;  . HAND SURGERY     left, secondary to self-inflicted laceration  . HEMORRHOID  SURGERY N/A 04/14/2017   Procedure: HEMORRHOIDECTOMY;  Surgeon: [name], MD;  Location: [facility];  Service: General;  Laterality: N/A;  . SHOULDER SURGERY     right  . TOE SURGERY     left great toe , amputated-lawnmover accident     Current Meds  Medication Sig  . albuterol (PROVENTIL) (2.5 MG/3ML) 0.083% nebulizer solution Take 3 mLs (2.5 mg total) by nebulization every 4 (four) hours as needed for shortness of breath.  Marland Kitchen albuterol (VENTOLIN HFA) 108 (90 Base) MCG/ACT inhaler Inhale 2 puffs into the lungs every 4 (four) hours as needed for wheezing or shortness of breath.  Marland Kitchen aspirin EC 81 MG tablet Take 81 mg by mouth every evening.   Marland Kitchen atorvastatin (LIPITOR) 20 MG tablet Take 1 tablet (20 mg total) by mouth daily. For high cholesterol (Patient taking differently: Take 20 mg by mouth every evening. For high cholesterol)  . dextromethorphan (DELSYM) 30 MG/5ML liquid Take 30 mg by mouth 2 (two) times daily as needed for cough.  . diltiazem (CARDIZEM CD) 180 MG 24 hr capsule Take 1 capsule (180 mg total) by mouth daily.  . divalproex (DEPAKOTE ER) 500 MG 24 hr tablet Take 3 tablets (1,500 mg total) by mouth at bedtime.  . feeding supplement, ENSURE ENLIVE, (ENSURE ENLIVE) LIQD Take 237 mLs by mouth 2 (two) times daily between meals.  Marland Kitchen FLUoxetine (PROZAC) 40 MG capsule Take 1 capsule (40 mg total) by mouth daily. For mood control  . furosemide (LASIX) 20 MG tablet Take 2 tablets (40 mg total) by mouth daily for 30 days.  Marland Kitchen gabapentin (NEURONTIN) 400 MG capsule Take 1 capsule (400 mg total) by mouth 3 (three) times daily.  Marland Kitchen guaiFENesin (MUCINEX) 600 MG 12 hr tablet Take 600 mg by mouth 2 (two) times daily as needed.   Marland Kitchen ipratropium (ATROVENT) 0.02 % nebulizer solution Take 2.5 mLs (0.5 mg total) by nebulization 3 (three) times daily.  . methocarbamol (ROBAXIN) 500 MG tablet Take 1 tablet by mouth 3 (three) times daily.  . metoprolol tartrate (LOPRESSOR) 25 MG tablet Take 1 tablet (25 mg  total) by mouth 2 (two) times daily. (Patient taking differently: Take 50 mg by mouth every evening. )  . Multiple Vitamin (MULTIVITAMIN WITH MINERALS) TABS tablet Take 1 tablet by mouth daily.  Marland Kitchen OLANZapine (ZYPREXA) 10 MG tablet Take 1.5 tablets (15 mg total) by mouth at bedtime. For mood stability  . potassium chloride SA (KLOR-CON) 20 MEQ tablet Take 1 tablet by mouth daily.  . traZODone (DESYREL) 50 MG tablet Take 2 tablets (100 mg total) by mouth at bedtime.  . vitamin B-12 (CYANOCOBALAMIN) 500 MCG tablet Take 1,000 mcg by mouth daily.     Allergies:   Augmentin [amoxicillin-pot clavulanate] and Ace inhibitors   Social History   Tobacco Use  . Smoking status: Current Some Day Smoker    Packs/day: 1.00    Years: 40.00    Pack years: 40.00    Types: Cigarettes    Start date: 08/04/1971  . Smokeless tobacco: Never Used  . Tobacco comment: peak rate of 2.5ppd, 1/2ppd on 11/27/2016 --  6 cigarettes / day 01/25/18  Substance Use Topics  . Alcohol use: No    Alcohol/week: 0.0 standard drinks  . Drug use: Not Currently    Types: Marijuana    Comment: most days      Family Hx: The patient's family history includes Anxiety disorder in his daughter and son; Asthma in his brother; Breast cancer in his mother; Depression in his daughter and son; Emphysema in his maternal grandfather; Heart attack in his maternal aunt, maternal grandfather, maternal grandmother, maternal uncle, paternal aunt, paternal grandfather, paternal grandmother, and paternal uncle; Heart disease in his father. There is no history of Colon cancer or Liver disease.  ROS:   Please see the history of present illness.     All other systems reviewed and are negative.   Prior CV studies:   The following studies were reviewed today:  Echocardiogram 10/12/2019:   1. Left ventricular ejection fraction, by visual estimation, is 65 to 70%. The left ventricle has hyperdynamic function. There is no left ventricular  hypertrophy.  2. Elevated left ventricular end-diastolic pressure.  3. Left ventricular diastolic parameters are consistent with Grade I diastolic dysfunction (impaired relaxation).  4. Global right ventricle has normal systolic function.The right ventricular size is normal. No increase in right ventricular wall thickness.  5. Left atrial size was mild-moderately dilated.  6. Right atrial size was normal.  7. The mitral valve is grossly normal. Mild mitral valve regurgitation.  8. The tricuspid valve is grossly normal. Tricuspid valve regurgitation is trivial.  9. Aortic valve regurgitation is mild to moderate. 10. The aortic valve is tricuspid. Aortic valve regurgitation is mild to moderate. No evidence of aortic valve sclerosis or stenosis. 11. The pulmonic valve was not well visualized. Pulmonic valve regurgitation is not visualized. 12. Normal pulmonary artery systolic pressure. 13. The inferior vena cava is normal in size with greater than 50% respiratory variability, suggesting right atrial pressure of 3 mmHg.  Labs/Other Tests and Data Reviewed:    EKG:  No ECG reviewed.  Recent Labs: 04/30/2019: ALT 24 05/01/2019: TSH 1.552 10/12/2019: B Natriuretic Peptide 306.0 10/16/2019: Magnesium 2.0 11/21/2019: BUN 12; Creatinine, Ser 0.53; Hemoglobin 12.6; Platelets 338; Potassium 4.1; Sodium 135   Recent Lipid Panel Lab Results  Component Value Date/Time   TRIG 84 02/23/2019 05:29 PM    Wt Readings from Last 3 Encounters:  11/29/19 150 lb 14.4 oz (68.4 kg)  11/21/19 155 lb (70.3 kg)  10/15/19 150 lb 12.7 oz (68.4 kg)     Objective:    Vital Signs:  BP 123/80   Pulse 93   Ht 5\' 10"  (1.778 m)   Wt 150 lb 14.4 oz (68.4 kg)   BMI 21.65 kg/m    VITAL SIGNS:  reviewed  ASSESSMENT & PLAN:    1.  Chronic diastolic heart failure: Symptomatically stable on Lasix 40 mg daily with supplemental potassium.  He has grade 1 diastolic dysfunction by echocardiogram November 2020.  2.   Hypertension: Blood pressure is normal.  No change to therapy.  3.  Abnormal nuclear stress test/CAD: Nuclear stress test from September 2017 reviewed above which is suggestive of underlying coronary artery disease.  He denies anginal symptoms.  Continue aspirin, metoprolol, and atorvastatin.    COVID-19 Education: The signs and symptoms of COVID-19 were discussed with the patient and how to seek care for testing (follow up with PCP or arrange E-visit).  The importance of social distancing was discussed today.  Time:   Today, I have  spent 15 minutes with the patient with telehealth technology discussing the above problems.     Medication Adjustments/Labs and Tests Ordered: Current medicines are reviewed at length with the patient today.  Concerns regarding medicines are outlined above.   Tests Ordered: No orders of the defined types were placed in this encounter.   Medication Changes: No orders of the defined types were placed in this encounter.   Follow Up:  Virtual Visit  in 1 year(s)  Signed, Prentice Docker, MD  11/29/2019 11:26 AM    [facility]

## 2019-11-29 NOTE — Patient Instructions (Addendum)
Medication Instructions:   Your physician recommends that you continue on your current medications as directed. Please refer to the Current Medication list given to you today.  Labwork:  NONE  Testing/Procedures:  NONE  Follow-Up:  Your physician recommends that you schedule a follow-up appointment in: 1 year (virtual). You will receive a reminder letter in the mail in about 10 months reminding you to call and schedule your appointment. If you don't receive this letter, please contact our office.  Any Other Special Instructions Will Be Listed Below (If Applicable).  If you need a refill on your cardiac medications before your next appointment, please call your pharmacy. 

## 2019-12-04 ENCOUNTER — Emergency Department (HOSPITAL_COMMUNITY): Payer: Medicare Other

## 2019-12-04 ENCOUNTER — Emergency Department (HOSPITAL_COMMUNITY)
Admission: EM | Admit: 2019-12-04 | Discharge: 2019-12-04 | Disposition: A | Discharge status: Home / Self Care | Payer: Medicare Other | Attending: Emergency Medicine | Admitting: Emergency Medicine

## 2019-12-04 ENCOUNTER — Encounter (HOSPITAL_COMMUNITY): Payer: Self-pay | Admitting: Emergency Medicine

## 2019-12-04 ENCOUNTER — Other Ambulatory Visit: Payer: Self-pay

## 2019-12-04 DIAGNOSIS — R0789 Other chest pain: Secondary | ICD-10-CM | POA: Insufficient documentation

## 2019-12-04 DIAGNOSIS — I5032 Chronic diastolic (congestive) heart failure: Secondary | ICD-10-CM | POA: Insufficient documentation

## 2019-12-04 DIAGNOSIS — R0602 Shortness of breath: Secondary | ICD-10-CM | POA: Diagnosis present

## 2019-12-04 DIAGNOSIS — Z79899 Other long term (current) drug therapy: Secondary | ICD-10-CM | POA: Diagnosis not present

## 2019-12-04 DIAGNOSIS — F17210 Nicotine dependence, cigarettes, uncomplicated: Secondary | ICD-10-CM | POA: Diagnosis not present

## 2019-12-04 DIAGNOSIS — J441 Chronic obstructive pulmonary disease with (acute) exacerbation: Secondary | ICD-10-CM

## 2019-12-04 DIAGNOSIS — I110 Hypertensive heart disease with heart failure: Secondary | ICD-10-CM | POA: Insufficient documentation

## 2019-12-04 MED ORDER — ALBUTEROL SULFATE HFA 108 (90 BASE) MCG/ACT IN AERS: 2.0000 | INHALATION_SPRAY | RESPIRATORY_TRACT | 3 refills | Status: AC | PRN

## 2019-12-04 MED ORDER — AEROCHAMBER Z-STAT PLUS/MEDIUM MISC
Status: AC
  Administered 2019-12-04: 1
  Filled 2019-12-04: qty 1

## 2019-12-04 MED ORDER — PREDNISONE 20 MG PO TABS
40.0000 mg | ORAL_TABLET | Freq: Once | ORAL | Status: AC
  Administered 2019-12-04: 40 mg via ORAL
  Filled 2019-12-04: qty 2

## 2019-12-04 MED ORDER — PREDNISONE 20 MG PO TABS: 40.0000 mg | ORAL_TABLET | Freq: Every day | ORAL | 0 refills | Status: DC

## 2019-12-04 MED ORDER — ALBUTEROL SULFATE HFA 108 (90 BASE) MCG/ACT IN AERS
2.0000 | INHALATION_SPRAY | Freq: Once | RESPIRATORY_TRACT | Status: AC
  Administered 2019-12-04: 2 via RESPIRATORY_TRACT
  Filled 2019-12-04: qty 6.7

## 2019-12-04 NOTE — ED Triage Notes (Signed)
SOB for one month and worse in last 2 days.  On O2@4 -5L/M [place].  Denies any other symptoms.

## 2019-12-04 NOTE — Discharge Instructions (Signed)
Your evaluation today showed no signs of pneumonia.  You must follow-up with your family doctor within the next 2 weeks for a recheck.  You likely need to be evaluated by a pulmonary specialist, they need to refer you to a pulmonary specialist within the next month.  Please continue to take your albuterol every 4 hours as needed.  Prednisone daily for 5 days.  Seek medical exam for severe or worsening symptoms including increasing chest pain shortness of breath weakness or fever

## 2019-12-04 NOTE — ED Provider Notes (Signed)
[facility] Provider Note   CSN: [id number] Arrival date & time: 12/04/19  8676     History Chief Complaint  Patient presents with  . Shortness of Breath    Jeffrey [name] is a 59 y.o. male.  HPI   This patient is a 59 year old male, known to me from his recent visit to the emergency department.  He has known congestive heart failure, severe COPD on home oxygen and bipolar disorder.  He presents with increasing shortness of breath, there has been a little bit of coughing, his wife who I spoke with on the phone today states that he has not appeared more short of breath but he stated that he was more short of breath today.  He did not want to come by ambulance but requested that his wife drop him off at the hospital.  He was recently treated for COPD exacerbation when I saw him, he was given albuterol and prednisone and seemed to improve initially but still feels short of breath and seem to be worse today.  He also had some slight chest pain today which made him concerned that he had pneumonia.  He denies swelling of the legs, denies fevers or chills, denies nausea vomiting or diarrhea or any other complaints.  His wife states that he did not take his medication this morning.  Past Medical History:  Diagnosis Date  . Acute blood loss anemia 02/07/2017  . Anxiety   . Arthritis    deg disease, bulging disk,  shoulder level  . Bipolar disorder ([place])   . Bipolar disorder ([place])   . CHF (congestive heart failure) ([place])   . COPD, severe ([place]) 10/09/2016  . Depression    anxiety  . Hyperlipidemia   . Hypertension   . Peptic ulcer disease    Review  . Pneumonia   . [name] (peripheral vascular disease) ([place]) 06/18/2015  . Shortness of breath     Patient Active Problem List   Diagnosis Date Noted  . COPD with acute exacerbation ([place]) 10/12/2019  . Respiratory failure with hypoxia ([place]) 04/30/2019  . Acute on chronic respiratory failure with hypoxia ([place])  04/30/2019  . Bronchiectasis ([place]) 03/24/2019  . Fever 03/24/2019  . Cough   . Dysphagia   . Palliative care by specialist   . Goals of care, counseling/discussion   . Hepatic encephalopathy ([place])   . COPD exacerbation ([place]) 09/16/2018  . Essential hypertension 09/16/2018  . GERD (gastroesophageal reflux disease) 09/16/2018  . Hypoxia   . Chronic diastolic CHF (congestive heart failure) ([place])   . Bipolar disorder with severe depression ([place]) 02/01/2018  . Alcohol use disorder, severe, dependence ([place]) 02/01/2018  . Cannabis use disorder, severe, dependence ([place]) 02/01/2018  . MDD (major depressive disorder), recurrent episode, severe ([place]) 01/31/2018  . Internal and external bleeding hemorrhoids   . Rectal bleeding   . Iron deficiency anemia due to chronic blood loss 03/09/2017  . Anemia   . GIB (gastrointestinal bleeding) 02/07/2017  . Peptic ulcer disease   . Bipolar disorder ([place])   . COPD, severe ([place]) 10/09/2016  . H/O: depression 08/26/2016  . Dyslipidemia 08/26/2016  . Tobacco use disorder 08/26/2016  . [name] (peripheral vascular disease) ([place]) 06/18/2015  . Chronic diarrhea 05/02/2013  . Hematochezia 05/02/2013  . Abnormal weight loss 05/02/2013  . Abdominal pain, periumbilical 19/50/9326    Past Surgical History:  Procedure Laterality Date  . BIOPSY  02/09/2017   Procedure: BIOPSY;  Surgeon: Danie Binder, MD;  Location: [facility];  Service: Endoscopy;;  duodenal gastric  . COLONOSCOPY  03/14/2007   [id number] colonoscopy and terminal ileoscopy except external hemorrhoids  . COLONOSCOPY N/A 05/05/2013   [name]: external/internal anal canal hemorrhoids, unable to intubate TI, segemental biopsies unremarkable   . COLONOSCOPY N/A 04/11/2017   Procedure: COLONOSCOPY;  Surgeon: [name], MD;  Location: [facility];  Service: Endoscopy;  Laterality: N/A;  . COLONOSCOPY WITH PROPOFOL N/A 03/31/2017   Procedure: COLONOSCOPY WITH PROPOFOL;  Surgeon: [name], MD;  Location: [facility];  Service: Endoscopy;  Laterality: N/A;  1:45pm  . ESOPHAGOGASTRODUODENOSCOPY  03/14/2007   [id number] antral gastritis with bulbar duodenitis/paucity to postbulbar duodenal folds and biopsy were benign with no evidence of villous atrophy.  . ESOPHAGOGASTRODUODENOSCOPY (EGD) WITH PROPOFOL N/A 02/09/2017   Procedure: ESOPHAGOGASTRODUODENOSCOPY (EGD) WITH PROPOFOL;  Surgeon: [name], MD;  Location: [facility];  Service: Endoscopy;  Laterality: N/A;  . GIVENS CAPSULE STUDY N/A 04/08/2017   Procedure: GIVENS CAPSULE STUDY;  Surgeon: [name], MD;  Location: [facility];  Service: Endoscopy;  Laterality: N/A;  . HAND SURGERY     left, secondary to self-inflicted laceration  . HEMORRHOID SURGERY N/A 04/14/2017   Procedure: HEMORRHOIDECTOMY;  Surgeon: [name], MD;  Location: [facility];  Service: General;  Laterality: N/A;  . SHOULDER SURGERY     right  . TOE SURGERY     left great toe , amputated-lawnmover accident       Family History  Problem Relation Age of Onset  . Breast cancer Mother        deceased  . Heart disease Father   . Depression Daughter   . Anxiety disorder Daughter   . Anxiety disorder Son   . Depression Son   . Asthma Brother   . Heart attack Maternal Aunt   . Heart attack Maternal Uncle   . Heart attack Paternal Aunt   . Heart attack Paternal Uncle   . Heart attack Maternal Grandmother   . Heart attack Maternal Grandfather   . Emphysema Maternal Grandfather   . Heart attack Paternal Grandmother   . Heart attack Paternal Grandfather   . Colon cancer Neg Hx   . Liver disease Neg Hx     Social History   Tobacco Use  . Smoking status: Current Some Day Smoker    Packs/day: 1.00    Years: 40.00    Pack years: 40.00    Types: Cigarettes    Start date: 08/04/1971  . Smokeless tobacco: Never Used  . Tobacco comment: peak rate of 2.5ppd, 1/2ppd on 11/27/2016 -- 6 cigarettes / day 01/25/18  Substance Use  Topics  . Alcohol use: No    Alcohol/week: 0.0 standard drinks  . Drug use: Not Currently    Types: Marijuana    Comment: most days     Home Medications Prior to Admission medications   Medication Sig Start Date End Date Taking? Authorizing Provider  albuterol (PROVENTIL) (2.5 MG/3ML) 0.083% nebulizer solution Take 3 mLs (2.5 mg total) by nebulization every 4 (four) hours as needed for shortness of breath. 01/13/19  Yes Mikhail, Nita Sells, DO  aspirin EC 81 MG tablet Take 81 mg by mouth every evening.    Yes [provider]  atorvastatin (LIPITOR) 20 MG tablet Take 1 tablet (20 mg total) by mouth daily. For high cholesterol Patient taking differently: Take 20 mg by mouth every evening. For high cholesterol 02/09/18  Yes Money,  Gerlene Burdock, FNP  dextromethorphan (DELSYM) 30 MG/5ML liquid Take 30 mg by mouth 2 (two) times daily as needed for cough.   Yes [provider]  diltiazem (CARDIZEM CD) 180 MG 24 hr capsule Take 1 capsule (180 mg total) by mouth daily. 03/08/19  Yes Rhetta Mura, MD  divalproex (DEPAKOTE ER) 500 MG 24 hr tablet Take 3 tablets (1,500 mg total) by mouth at bedtime. 11/14/19  Yes Myrlene Broker, MD  feeding supplement, ENSURE ENLIVE, (ENSURE ENLIVE) LIQD Take 237 mLs by mouth 2 (two) times daily between meals. 03/30/19  Yes Shah, Pratik D, DO  FLUoxetine (PROZAC) 40 MG capsule Take 1 capsule (40 mg total) by mouth daily. For mood control 11/14/19  Yes Myrlene Broker, MD  folic acid (FOLVITE) 1 MG tablet Take 1 mg by mouth daily. 11/29/19  Yes [provider]  furosemide (LASIX) 20 MG tablet Take 2 tablets (40 mg total) by mouth daily for 30 days. 05/02/19 12/04/19 Yes Shah, Pratik D, DO  gabapentin (NEURONTIN) 400 MG capsule Take 1 capsule (400 mg total) by mouth 3 (three) times daily. 11/14/19  Yes Myrlene Broker, MD  guaiFENesin (MUCINEX) 600 MG 12 hr tablet Take 600 mg by mouth 2 (two) times daily as needed.    Yes [provider]   ipratropium (ATROVENT) 0.02 % nebulizer solution Take 2.5 mLs (0.5 mg total) by nebulization 3 (three) times daily. 01/13/19  Yes Mikhail, Nita Sells, DO  methocarbamol (ROBAXIN) 500 MG tablet Take 1 tablet by mouth 3 (three) times daily. 03/16/19  Yes [provider]  metoprolol tartrate (LOPRESSOR) 25 MG tablet Take 1 tablet (25 mg total) by mouth 2 (two) times daily. Patient taking differently: Take 50 mg by mouth every evening.  03/07/19  Yes Rhetta Mura, MD  Multiple Vitamin (MULTIVITAMIN WITH MINERALS) TABS tablet Take 1 tablet by mouth daily. 01/13/19  Yes Mikhail, Maryann, DO  OLANZapine (ZYPREXA) 10 MG tablet Take 1.5 tablets (15 mg total) by mouth at bedtime. For mood stability 11/14/19  Yes Myrlene Broker, MD  pantoprazole (PROTONIX) 20 MG tablet Take 20 mg by mouth daily. 11/29/19  Yes [provider]  potassium chloride SA (KLOR-CON) 20 MEQ tablet Take 1 tablet by mouth daily. 07/17/19  Yes [provider]  traZODone (DESYREL) 50 MG tablet Take 2 tablets (100 mg total) by mouth at bedtime. 11/14/19  Yes Myrlene Broker, MD  vitamin B-12 (CYANOCOBALAMIN) 500 MCG tablet Take 1,000 mcg by mouth daily.   Yes [provider]  albuterol (VENTOLIN HFA) 108 (90 Base) MCG/ACT inhaler Inhale 2 puffs into the lungs every 4 (four) hours as needed for wheezing or shortness of breath. 12/04/19   Eber Hong, MD  predniSONE (DELTASONE) 20 MG tablet Take 2 tablets (40 mg total) by mouth daily. 12/04/19   Eber Hong, MD    Allergies    Augmentin [amoxicillin-pot clavulanate] and Ace inhibitors  Review of Systems   Review of Systems  Constitutional: Negative for fever.  Respiratory: Positive for cough and shortness of breath.   Cardiovascular: Positive for chest pain. Negative for leg swelling.  Neurological: Negative for weakness, numbness and headaches.  All other systems reviewed and are negative.   Physical Exam Updated Vital Signs BP 139/68    Pulse 80   Temp 97.8 F (36.6 C) (Oral)   Resp 20   Ht 1.778 m (5\' 10" )   Wt 69.9 kg   SpO2 98%   BMI 22.10 kg/m   Physical  Exam Vitals and nursing note reviewed.  Constitutional:      General: He is not in acute distress.    Appearance: He is well-developed.  HENT:     Head: Normocephalic and atraumatic.     Mouth/Throat:     Pharynx: No oropharyngeal exudate.  Eyes:     General: No scleral icterus.       Right eye: No discharge.        Left eye: No discharge.     Conjunctiva/sclera: Conjunctivae normal.     Pupils: Pupils are equal, round, and reactive to light.  Neck:     Thyroid: No thyromegaly.     Vascular: No JVD.     Comments: No JVD Cardiovascular:     Rate and Rhythm: Normal rate and regular rhythm.     Heart sounds: Normal heart sounds. No murmur. No friction rub. No gallop.   Pulmonary:     Effort: Pulmonary effort is normal. No respiratory distress.     Breath sounds: Wheezing present. No rales.     Comments: Diminished lung sounds bilaterally with increasing wheezing on forced expiration.  Speaks in full sentences, no accessory muscle use Abdominal:     General: Bowel sounds are normal. There is no distension.     Palpations: Abdomen is soft. There is no mass.     Tenderness: There is no abdominal tenderness.  Musculoskeletal:        General: No tenderness. Normal range of motion.     Cervical back: Normal range of motion and neck supple.     Comments: No peripheral edema  Lymphadenopathy:     Cervical: No cervical adenopathy.  Skin:    General: Skin is warm and dry.     Findings: No erythema or rash.  Neurological:     Mental Status: He is alert.     Coordination: Coordination normal.  Psychiatric:        Behavior: Behavior normal.     ED Results / Procedures / Treatments   Labs (all labs ordered are listed, but only abnormal results are displayed) Labs Reviewed - No data to display  EKG None  Radiology DG Chest Port 1 View  Result  Date: 12/04/2019 CLINICAL DATA:  Shortness of breath EXAM: PORTABLE CHEST 1 VIEW COMPARISON:  November 21, 2019 and October 15, 2019 chest radiograph; chest CT Mar 26, 2019 FINDINGS: There is a degree of hyperexpansion, stable. There is underlying fibrotic type change without frank edema or consolidation apparent. No new opacity appreciable. Heart size is normal. Pulmonary vascularity is stable and within normal limits. No adenopathy. No bone lesions. IMPRESSION: Findings indicative of underlying COPD with scattered fibrosis throughout the lungs. No frank edema or focal airspace opacity. Cardiac silhouette within normal limits and stable. No adenopathy evident. Electronically Signed   By: Bretta BangWilliam  Woodruff III M.D.   On: 12/04/2019 10:08    Procedures Procedures (including critical care time)  Medications Ordered in ED Medications  albuterol (VENTOLIN HFA) 108 (90 Base) MCG/ACT inhaler 2 puff (2 puffs Inhalation Given 12/04/19 1007)  predniSONE (DELTASONE) tablet 40 mg (40 mg Oral Given 12/04/19 1007)  aerochamber Z-Stat Plus/medium (1 each  Given 12/04/19 1007)    ED Course  I have reviewed the triage vital signs and the nursing notes.  Pertinent labs & imaging results that were available during my care of the patient were reviewed by me and considered in my medical decision making (see chart for details).    MDM Rules/Calculators/A&P  The patient is on his baseline 4 to 5 L, oxygenating in the upper 90% and speaking in full sentences.  He has lung sounds consistent with reactive airway disease and COPD.  He will be given MDI treatments, steroids, x-ray to rule out worsening condition such as overlying pneumonia.  My review of the medical record shows that the patient has been seen multiple times for condition similar to this, has been diagnosed with an empyema and pneumonia in the past and has some chronic lung disease underlying this.  He is certainly predisposed to  bacterial superinfection however he is afebrile, pulse of 70, blood pressure of 134/71 and overall reassuring at his baseline.  The patient states that he now feels much better after his wife gave the medication this morning  The patient continues to appear well, he has improved with treatments, he is comforted knowing that his x-ray is negative.  On his baseline oxygen he is 98% and states he is feeling well and wants to go home.  I think this is reasonable, he does not appear in distress and I do not think he needs aggressive evaluation inpatient treatment or other emergency stabilizing procedures.  The patient is aware of the indications for return  Jeffrey [name] was evaluated in Emergency Department on 12/04/2019 for the symptoms described in the history of present illness. He was evaluated in the context of the global COVID-19 pandemic, which necessitated consideration that the patient might be at risk for infection with the SARS-CoV-2 virus that causes COVID-19. Institutional protocols and algorithms that pertain to the evaluation of patients at risk for COVID-19 are in a state of rapid change based on information released by regulatory bodies including the CDC and federal and state organizations. These policies and algorithms were followed during the patient's care in the ED.   Final Clinical Impression(s) / ED Diagnoses Final diagnoses:  COPD exacerbation (HCC)    Rx / DC Orders ED Discharge Orders         Ordered    predniSONE (DELTASONE) 20 MG tablet  Daily     12/04/19 1123    albuterol (VENTOLIN HFA) 108 (90 Base) MCG/ACT inhaler  Every 4 hours PRN     12/04/19 1123           Eber Hong, MD 12/04/19 1125

## 2019-12-07 ENCOUNTER — Encounter (HOSPITAL_COMMUNITY): Payer: Self-pay

## 2019-12-07 ENCOUNTER — Emergency Department (HOSPITAL_COMMUNITY): Payer: Medicare Other

## 2019-12-07 ENCOUNTER — Emergency Department (HOSPITAL_COMMUNITY)
Admission: EM | Admit: 2019-12-07 | Discharge: 2019-12-07 | Disposition: A | Discharge status: Home / Self Care | Payer: Medicare Other | Attending: Emergency Medicine | Admitting: Emergency Medicine

## 2019-12-07 ENCOUNTER — Other Ambulatory Visit: Payer: Self-pay

## 2019-12-07 DIAGNOSIS — I110 Hypertensive heart disease with heart failure: Secondary | ICD-10-CM | POA: Insufficient documentation

## 2019-12-07 DIAGNOSIS — J441 Chronic obstructive pulmonary disease with (acute) exacerbation: Secondary | ICD-10-CM | POA: Diagnosis not present

## 2019-12-07 DIAGNOSIS — I5032 Chronic diastolic (congestive) heart failure: Secondary | ICD-10-CM | POA: Diagnosis not present

## 2019-12-07 DIAGNOSIS — Z79899 Other long term (current) drug therapy: Secondary | ICD-10-CM | POA: Diagnosis not present

## 2019-12-07 DIAGNOSIS — R0602 Shortness of breath: Secondary | ICD-10-CM | POA: Diagnosis present

## 2019-12-07 DIAGNOSIS — Z7982 Long term (current) use of aspirin: Secondary | ICD-10-CM | POA: Diagnosis not present

## 2019-12-07 DIAGNOSIS — F17210 Nicotine dependence, cigarettes, uncomplicated: Secondary | ICD-10-CM | POA: Diagnosis not present

## 2019-12-07 LAB — BASIC METABOLIC PANEL
Anion gap: 11 (ref 5–15)
BUN: 12 mg/dL (ref 6–20)
CO2: 42 mmol/L — ABNORMAL HIGH (ref 22–32)
Calcium: 9.4 mg/dL (ref 8.9–10.3)
Chloride: 82 mmol/L — ABNORMAL LOW (ref 98–111)
Creatinine, Ser: 0.53 mg/dL — ABNORMAL LOW (ref 0.61–1.24)
GFR calc Af Amer: >60 mL/min (ref >60)
GFR calc non Af Amer: >60 mL/min (ref >60)
Glucose, Bld: 121 mg/dL — ABNORMAL HIGH (ref 70–99)
Potassium: 4.5 mmol/L (ref 3.5–5.1)
Sodium: 135 mmol/L (ref 135–145)

## 2019-12-07 LAB — CBC WITH DIFFERENTIAL/PLATELET
Abs Immature Granulocytes: 0.02 10*3/uL (ref 0.00–0.07)
Basophils Absolute: 0 10*3/uL (ref 0.0–0.1)
Basophils Relative: 0 %
Eosinophils Absolute: 0 10*3/uL (ref 0.0–0.5)
Eosinophils Relative: 0 %
HCT: 39.8 % (ref 39.0–52.0)
Hemoglobin: 12.3 g/dL — ABNORMAL LOW (ref 13.0–17.0)
Immature Granulocytes: 0 %
Lymphocytes Relative: 9 %
Lymphs Abs: 0.7 10*3/uL (ref 0.7–4.0)
MCH: 28.6 pg (ref 26.0–34.0)
MCHC: 30.9 g/dL (ref 30.0–36.0)
MCV: 92.6 fL (ref 80.0–100.0)
Monocytes Absolute: 0.6 10*3/uL (ref 0.1–1.0)
Monocytes Relative: 7 %
Neutro Abs: 6.8 10*3/uL (ref 1.7–7.7)
Neutrophils Relative %: 84 %
Platelets: 265 10*3/uL (ref 150–400)
RBC: 4.3 MIL/uL (ref 4.22–5.81)
RDW: 12.1 % (ref 11.5–15.5)
WBC: 8.2 10*3/uL (ref 4.0–10.5)
nRBC: 0 % (ref 0.0–0.2)

## 2019-12-07 LAB — TROPONIN I (HIGH SENSITIVITY)
Troponin I (High Sensitivity): 3 ng/L (ref <18)
Troponin I (High Sensitivity): 3 ng/L (ref <18)

## 2019-12-07 LAB — BRAIN NATRIURETIC PEPTIDE: B Natriuretic Peptide: 368 pg/mL — ABNORMAL HIGH (ref 0.0–100.0)

## 2019-12-07 MED ORDER — DOXYCYCLINE HYCLATE 100 MG PO CAPS: 100.0000 mg | ORAL_CAPSULE | Freq: Two times a day (BID) | ORAL | 0 refills | Status: DC

## 2019-12-07 MED ORDER — PREDNISONE 20 MG PO TABS: ORAL_TABLET | ORAL | 0 refills | Status: DC

## 2019-12-07 MED ORDER — ALBUTEROL SULFATE HFA 108 (90 BASE) MCG/ACT IN AERS
8.0000 | INHALATION_SPRAY | Freq: Once | RESPIRATORY_TRACT | Status: AC
  Administered 2019-12-07: 8 via RESPIRATORY_TRACT
  Filled 2019-12-07: qty 6.7

## 2019-12-07 MED ORDER — METHYLPREDNISOLONE SODIUM SUCC 125 MG IJ SOLR
125.0000 mg | Freq: Once | INTRAMUSCULAR | Status: AC
  Administered 2019-12-07: 125 mg via INTRAVENOUS
  Filled 2019-12-07: qty 2

## 2019-12-07 NOTE — ED Triage Notes (Addendum)
Pt reports shortness of breath that 3 days ago, pt was seen here on 12/04/2019 for the same and discharged with Rx of albuterol and prednisone, pt says he is taking this medication as prescribed, but still no improvements with breathing. Pt is on 4L oxygen continuously, pt able to speak full sentences. Pt states, "the problem I have tonight is getting my oxygen put together at home. I'm not good at putting stuff together, so if I have to be admitted to get my oxygen straightened out then it will be ok." pt says he was here earlier last night, about 1900 but did not check in.

## 2019-12-07 NOTE — Discharge Instructions (Signed)
Change the dosing of the prednisone to the new prescription I have sent tonight.  Use your albuterol every 4 hours and if you need to in between every 2 hours.  Follow-up with your doctor as soon as possible.

## 2019-12-07 NOTE — ED Notes (Signed)
Pt came out to desk after pulling off heart monitor and oxygen. Pt wanting to leave stating that no one is helping him. He has been encouraged to stay and made aware that we are waiting on test results. Pt O2 at 78% on RA. EDP made aware. Pt redirected back to room and placed back on oxygen until his wife can get here to pick him up.

## 2019-12-07 NOTE — ED Provider Notes (Signed)
[facility] EMERGENCY DEPARTMENT Provider Note   CSN: [id number] Arrival date & time: 12/07/19  0050     History Chief Complaint  Patient presents with  . Shortness of Breath    Jeffrey [name] is a 59 y.o. male.  Patient returns to the emergency department with complaints of shortness of breath.  Patient seen several days ago with similar.  He reports that his breathing has worsened.  When asked if he is using his nebulizer he reports "sometimes".  Patient on oxygen continuously at home.  He reports that he has been using his oxygen continuously.  He has not had any fever.  Patient denies chest pain.        Past Medical History:  Diagnosis Date  . Acute blood loss anemia 02/07/2017  . Anxiety   . Arthritis    deg disease, bulging disk,  shoulder level  . Bipolar disorder (HCC)   . Bipolar disorder (HCC)   . CHF (congestive heart failure) (HCC)   . 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 Depression    anxiety  . Hyperlipidemia   . Hypertension   . Peptic ulcer disease    Review  . Pneumonia   . [name] (peripheral vascular disease) (HCC) 06/18/2015  . Shortness of breath     Patient Active Problem List   Diagnosis Date Noted  . COPD with acute exacerbation (HCC) 10/12/2019  . Respiratory failure with hypoxia (HCC) 04/30/2019  . Acute on chronic respiratory failure with hypoxia (HCC) 04/30/2019  . Bronchiectasis (HCC) 03/24/2019  . Fever 03/24/2019  . Cough   . Dysphagia   . Palliative care by specialist   . Goals of care, counseling/discussion   . Hepatic encephalopathy (HCC)   . COPD exacerbation (HCC) 09/16/2018  . Essential hypertension 09/16/2018  . GERD (gastroesophageal reflux disease) 09/16/2018  . Hypoxia   . Chronic diastolic CHF (congestive heart failure) (HCC)   . Bipolar disorder with severe depression (HCC) 02/01/2018  . Alcohol use disorder, severe, dependence (HCC) 02/01/2018  . Cannabis use disorder, severe, dependence (HCC) 02/01/2018  . MDD (major  depressive disorder), recurrent episode, severe (HCC) 01/31/2018  . Internal and external bleeding hemorrhoids   . Rectal bleeding   . Iron deficiency anemia due to chronic blood loss 03/09/2017  . Anemia   . GIB (gastrointestinal bleeding) 02/07/2017  . Peptic ulcer disease   . Bipolar disorder (HCC)   . 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 H/O: depression 08/26/2016  . Dyslipidemia 08/26/2016  . Tobacco use disorder 08/26/2016  . [name] (peripheral vascular disease) (HCC) 06/18/2015  . Chronic diarrhea 05/02/2013  . Hematochezia 05/02/2013  . Abnormal weight loss 05/02/2013  . Abdominal pain, periumbilical 05/02/2013    Past Surgical History:  Procedure Laterality Date  . BIOPSY  02/09/2017   Procedure: BIOPSY;  Surgeon: [name], MD;  Location: [facility];  Service: Endoscopy;;  duodenal gastric  . COLONOSCOPY  03/14/2007   NGE:XBMWUX colonoscopy and terminal ileoscopy except external hemorrhoids  . COLONOSCOPY N/A 05/05/2013   [name]: external/internal anal canal hemorrhoids, unable to intubate TI, segemental biopsies unremarkable   . COLONOSCOPY N/A 04/11/2017   Procedure: COLONOSCOPY;  Surgeon: [name], MD;  Location: [facility];  Service: Endoscopy;  Laterality: N/A;  . COLONOSCOPY WITH PROPOFOL N/A 03/31/2017   Procedure: COLONOSCOPY WITH PROPOFOL;  Surgeon: [name], MD;  Location: [facility];  Service: Endoscopy;  Laterality: N/A;  1:45pm  . ESOPHAGOGASTRODUODENOSCOPY  03/14/2007  ONG:[id number]:Nonerosive antral gastritis with bulbar duodenitis/paucity to postbulbar duodenal folds and biopsy were benign with no evidence of villous atrophy.  . ESOPHAGOGASTRODUODENOSCOPY (EGD) WITH PROPOFOL N/A 02/09/2017   Procedure: ESOPHAGOGASTRODUODENOSCOPY (EGD) WITH PROPOFOL;  Surgeon: West BaliSandi L Fields, MD;  Location: [facility];  Service: Endoscopy;  Laterality: N/A;  . GIVENS CAPSULE STUDY N/A 04/08/2017   Procedure: GIVENS CAPSULE STUDY;  Surgeon: Corbin Adeourk, Robert M, MD;   Location: [facility];  Service: Endoscopy;  Laterality: N/A;  . HAND SURGERY     left, secondary to self-inflicted laceration  . HEMORRHOID SURGERY N/A 04/14/2017   Procedure: HEMORRHOIDECTOMY;  Surgeon: Franky MachoJenkins, Mark, MD;  Location: [facility];  Service: General;  Laterality: N/A;  . SHOULDER SURGERY     right  . TOE SURGERY     left great toe , amputated-lawnmover accident       Family History  Problem Relation Age of Onset  . Breast cancer Mother        deceased  . Heart disease Father   . Depression Daughter   . Anxiety disorder Daughter   . Anxiety disorder Son   . Depression Son   . Asthma Brother   . Heart attack Maternal Aunt   . Heart attack Maternal Uncle   . Heart attack Paternal Aunt   . Heart attack Paternal Uncle   . Heart attack Maternal Grandmother   . Heart attack Maternal Grandfather   . Emphysema Maternal Grandfather   . Heart attack Paternal Grandmother   . Heart attack Paternal Grandfather   . Colon cancer Neg Hx   . Liver disease Neg Hx     Social History   Tobacco Use  . Smoking status: Current Some Day Smoker    Packs/day: 1.00    Years: 40.00    Pack years: 40.00    Types: Cigarettes    Start date: 08/04/1971  . Smokeless tobacco: Never Used  . Tobacco comment: peak rate of 2.5ppd, 1/2ppd on 11/27/2016 -- 6 cigarettes / day 01/25/18  Substance Use Topics  . Alcohol use: No    Alcohol/week: 0.0 standard drinks  . Drug use: Not Currently    Types: Marijuana    Comment: most days     Home Medications Prior to Admission medications   Medication Sig Start Date End Date Taking? Authorizing Provider  albuterol (PROVENTIL) (2.5 MG/3ML) 0.083% nebulizer solution Take 3 mLs (2.5 mg total) by nebulization every 4 (four) hours as needed for shortness of breath. 01/13/19   Mikhail, Nita SellsMaryann, DO  albuterol (VENTOLIN HFA) 108 (90 Base) MCG/ACT inhaler Inhale 2 puffs into the lungs every 4 (four) hours as needed for wheezing or shortness of breath. 12/04/19    Eber HongMiller, Brian, MD  aspirin EC 81 MG tablet Take 81 mg by mouth every evening.     [provider]  atorvastatin (LIPITOR) 20 MG tablet Take 1 tablet (20 mg total) by mouth daily. For high cholesterol Patient taking differently: Take 20 mg by mouth every evening. For high cholesterol 02/09/18   Money, Gerlene Burdockravis B, FNP  dextromethorphan (DELSYM) 30 MG/5ML liquid Take 30 mg by mouth 2 (two) times daily as needed for cough.    [provider]  diltiazem (CARDIZEM CD) 180 MG 24 hr capsule Take 1 capsule (180 mg total) by mouth daily. 03/08/19   Rhetta MuraSamtani, Jai-Gurmukh, MD  divalproex (DEPAKOTE ER) 500 MG 24 hr tablet Take 3 tablets (1,500 mg total) by mouth at bedtime. 11/14/19   Myrlene Brokeross, Deborah R, MD  doxycycline (VIBRAMYCIN) 100 MG capsule Take 1 capsule (100 mg total) by mouth 2 (two) times daily. 12/07/19   Gilda Crease, MD  feeding supplement, ENSURE ENLIVE, (ENSURE ENLIVE) LIQD Take 237 mLs by mouth 2 (two) times daily between meals. 03/30/19   Sherryll Burger, Pratik D, DO  FLUoxetine (PROZAC) 40 MG capsule Take 1 capsule (40 mg total) by mouth daily. For mood control 11/14/19   Myrlene Broker, MD  folic acid (FOLVITE) 1 MG tablet Take 1 mg by mouth daily. 11/29/19   [provider]  furosemide (LASIX) 20 MG tablet Take 2 tablets (40 mg total) by mouth daily for 30 days. 05/02/19 12/04/19  Sherryll Burger, Pratik D, DO  gabapentin (NEURONTIN) 400 MG capsule Take 1 capsule (400 mg total) by mouth 3 (three) times daily. 11/14/19   Myrlene Broker, MD  guaiFENesin (MUCINEX) 600 MG 12 hr tablet Take 600 mg by mouth 2 (two) times daily as needed.     [provider]  ipratropium (ATROVENT) 0.02 % nebulizer solution Take 2.5 mLs (0.5 mg total) by nebulization 3 (three) times daily. 01/13/19   Mikhail, Nita Sells, DO  methocarbamol (ROBAXIN) 500 MG tablet Take 1 tablet by mouth 3 (three) times daily. 03/16/19   [provider]  metoprolol tartrate (LOPRESSOR) 25 MG tablet Take 1 tablet (25 mg  total) by mouth 2 (two) times daily. Patient taking differently: Take 50 mg by mouth every evening.  03/07/19   Rhetta Mura, MD  Multiple Vitamin (MULTIVITAMIN WITH MINERALS) TABS tablet Take 1 tablet by mouth daily. 01/13/19   Mikhail, Nita Sells, DO  OLANZapine (ZYPREXA) 10 MG tablet Take 1.5 tablets (15 mg total) by mouth at bedtime. For mood stability 11/14/19   Myrlene Broker, MD  pantoprazole (PROTONIX) 20 MG tablet Take 20 mg by mouth daily. 11/29/19   [provider]  potassium chloride SA (KLOR-CON) 20 MEQ tablet Take 1 tablet by mouth daily. 07/17/19   [provider]  predniSONE (DELTASONE) 20 MG tablet 3 tabs po daily x 3 days, then 2 tabs x 3 days, then 1.5 tabs x 3 days, then 1 tab x 3 days, then 0.5 tabs x 3 days 12/07/19   Gilda Crease, MD  traZODone (DESYREL) 50 MG tablet Take 2 tablets (100 mg total) by mouth at bedtime. 11/14/19   Myrlene Broker, MD  vitamin B-12 (CYANOCOBALAMIN) 500 MCG tablet Take 1,000 mcg by mouth daily.    [provider]    Allergies    Augmentin [amoxicillin-pot clavulanate] and Ace inhibitors  Review of Systems   Review of Systems  Respiratory: Positive for shortness of breath.   All other systems reviewed and are negative.   Physical Exam Updated Vital Signs BP (!) 155/71 (BP Location: Right Arm)   Pulse 75   Temp (!) 97.4 F (36.3 C) (Oral)   Resp (!) 21   SpO2 98%   Physical Exam Vitals and nursing note reviewed.  Constitutional:      General: He is not in acute distress.    Appearance: Normal appearance. He is well-developed.  HENT:     Head: Normocephalic and atraumatic.     Right Ear: Hearing normal.     Left Ear: Hearing normal.     Nose: Nose normal.  Eyes:     Conjunctiva/sclera: Conjunctivae normal.     Pupils: Pupils are equal, round, and reactive to light.  Cardiovascular:     Rate and Rhythm: Regular rhythm.     Heart  sounds: S1 normal and S2 normal. No murmur. No friction rub.  No gallop.   Pulmonary:     Effort: Pulmonary effort is normal. No respiratory distress.     Breath sounds: Decreased breath sounds and wheezing present.  Chest:     Chest wall: No tenderness.  Abdominal:     General: Bowel sounds are normal.     Palpations: Abdomen is soft.     Tenderness: There is no abdominal tenderness. There is no guarding or rebound. Negative signs include Murphy's sign and McBurney's sign.     Hernia: No hernia is present.  Musculoskeletal:        General: Normal range of motion.     Cervical back: Normal range of motion and neck supple.  Skin:    General: Skin is warm and dry.     Findings: No rash.  Neurological:     Mental Status: He is alert and oriented to person, place, and time.     GCS: GCS eye subscore is 4. GCS verbal subscore is 5. GCS motor subscore is 6.     Cranial Nerves: No cranial nerve deficit.     Sensory: No sensory deficit.     Coordination: Coordination normal.  Psychiatric:        Speech: Speech normal.        Behavior: Behavior normal.        Thought Content: Thought content normal.     ED Results / Procedures / Treatments   Labs (all labs ordered are listed, but only abnormal results are displayed) Labs Reviewed  BRAIN NATRIURETIC PEPTIDE - Abnormal; Notable for the following components:      Result Value   B Natriuretic Peptide 368.0 (*)    All other components within normal limits  BASIC METABOLIC PANEL - Abnormal; Notable for the following components:   Chloride 82 (*)    CO2 42 (*)    Glucose, Bld 121 (*)    Creatinine, Ser 0.53 (*)    All other components within normal limits  CBC WITH DIFFERENTIAL/PLATELET - Abnormal; Notable for the following components:   Hemoglobin 12.3 (*)    All other components within normal limits  TROPONIN I (HIGH SENSITIVITY)  TROPONIN I (HIGH SENSITIVITY)    EKG EKG Interpretation  Date/Time:  Thursday December 07 2019 01:38:36 EST Ventricular Rate:  77 PR Interval:    QRS  Duration: 95 QT Interval:  377 QTC Calculation: 427 R Axis:   79 Text Interpretation: Sinus rhythm Borderline short PR interval Probable left ventricular hypertrophy No significant change since last tracing Confirmed by Gilda Crease [phone]) on 12/07/2019 3:18:32 AM   Radiology DG Chest Portable 1 View  Result Date: 12/07/2019 CLINICAL DATA:  59 year old male with shortness of breath for 3 days. Previously negative for COVID-19, not being tested at this time. EXAM: PORTABLE CHEST 1 VIEW COMPARISON:  Portable chest 12/04/2019 and earlier. FINDINGS: Portable AP upright view at 0220 hours. Lung volumes are stable. Mediastinal contours remain normal. Visualized tracheal air column is within normal limits. Coarse bilateral increased pulmonary interstitial opacity remains stable since last month. No pneumothorax, pleural effusion or new pulmonary opacity. No acute osseous abnormality identified. Negative visible bowel gas pattern. IMPRESSION: Stable portable chest with suspected chronic lung disease and no definite cardiopulmonary abnormality. Electronically Signed   By: Odessa Fleming M.D.   On: 12/07/2019 02:41    Procedures Procedures (including critical care time)  Medications Ordered in ED Medications  methylPREDNISolone sodium succinate (SOLU-MEDROL)  125 mg/2 mL injection 125 mg (125 mg Intravenous Given 12/07/19 0212)  albuterol (VENTOLIN HFA) 108 (90 Base) MCG/ACT inhaler 8 puff (8 puffs Inhalation Given 12/07/19 0211)    ED Course  I have reviewed the triage vital signs and the nursing notes.  Pertinent labs & imaging results that were available during my care of the patient were reviewed by me and considered in my medical decision making (see chart for details).    MDM Rules/Calculators/A&P                      Patient presents with shortness of breath.  He indicated that he was having some difficulty with his oxygen at home to the triage nurse.  I also do not get the feeling that  he is appropriately using his albuterol based on his responses.  He did have significant wheezing present upon arrival to the ER.  This has completely resolved after albuterol administration.  Patient has been maintained on his normal 4 L and is not hypoxic at all.  He appears to be comfortable with his breathing.  His work-up is unremarkable.  Chest x-ray does not show evidence of pneumonia and there is no evidence of congestive heart failure on x-ray or on examination.  Patient does not appear to have any indication for admission at this time.  He does report that he is feeling better would like to go home.  We will discharge, continue steroids and albuterol, follow-up with PCP.  Final Clinical Impression(s) / ED Diagnoses Final diagnoses:  COPD exacerbation ([place])    Rx / DC Orders ED Discharge Orders         Ordered    predniSONE (DELTASONE) 20 MG tablet     12/07/19 0441    doxycycline (VIBRAMYCIN) 100 MG capsule  2 times daily     12/07/19 0441           Orpah Greek, MD 12/07/19 [phone]

## 2019-12-24 ENCOUNTER — Inpatient Hospital Stay (HOSPITAL_COMMUNITY): Payer: Medicare Other

## 2019-12-24 ENCOUNTER — Encounter (HOSPITAL_COMMUNITY): Payer: Self-pay

## 2019-12-24 ENCOUNTER — Other Ambulatory Visit: Payer: Self-pay

## 2019-12-24 ENCOUNTER — Emergency Department (HOSPITAL_COMMUNITY): Payer: Medicare Other

## 2019-12-24 ENCOUNTER — Inpatient Hospital Stay (HOSPITAL_COMMUNITY)
Admission: EM | Admit: 2019-12-24 | Discharge: 2020-01-10 | DRG: 208 | Disposition: A | Discharge status: Skilled Nursing Facility | Payer: Medicare Other | Attending: Family Medicine | Admitting: Family Medicine

## 2019-12-24 DIAGNOSIS — J181 Lobar pneumonia, unspecified organism: Secondary | ICD-10-CM | POA: Diagnosis not present

## 2019-12-24 DIAGNOSIS — J690 Pneumonitis due to inhalation of food and vomit: Secondary | ICD-10-CM | POA: Diagnosis present

## 2019-12-24 DIAGNOSIS — R45851 Suicidal ideations: Secondary | ICD-10-CM | POA: Diagnosis present

## 2019-12-24 DIAGNOSIS — I10 Essential (primary) hypertension: Secondary | ICD-10-CM | POA: Diagnosis present

## 2019-12-24 DIAGNOSIS — E876 Hypokalemia: Secondary | ICD-10-CM | POA: Diagnosis present

## 2019-12-24 DIAGNOSIS — Z803 Family history of malignant neoplasm of breast: Secondary | ICD-10-CM

## 2019-12-24 DIAGNOSIS — J441 Chronic obstructive pulmonary disease with (acute) exacerbation: Secondary | ICD-10-CM | POA: Diagnosis present

## 2019-12-24 DIAGNOSIS — I5033 Acute on chronic diastolic (congestive) heart failure: Secondary | ICD-10-CM | POA: Diagnosis present

## 2019-12-24 DIAGNOSIS — Z20822 Contact with and (suspected) exposure to covid-19: Secondary | ICD-10-CM | POA: Diagnosis present

## 2019-12-24 DIAGNOSIS — I739 Peripheral vascular disease, unspecified: Secondary | ICD-10-CM | POA: Diagnosis present

## 2019-12-24 DIAGNOSIS — F17210 Nicotine dependence, cigarettes, uncomplicated: Secondary | ICD-10-CM | POA: Diagnosis present

## 2019-12-24 DIAGNOSIS — R443 Hallucinations, unspecified: Secondary | ICD-10-CM | POA: Diagnosis not present

## 2019-12-24 DIAGNOSIS — Z9289 Personal history of other medical treatment: Secondary | ICD-10-CM

## 2019-12-24 DIAGNOSIS — Z7982 Long term (current) use of aspirin: Secondary | ICD-10-CM

## 2019-12-24 DIAGNOSIS — J439 Emphysema, unspecified: Secondary | ICD-10-CM | POA: Diagnosis present

## 2019-12-24 DIAGNOSIS — E785 Hyperlipidemia, unspecified: Secondary | ICD-10-CM | POA: Diagnosis present

## 2019-12-24 DIAGNOSIS — G9340 Encephalopathy, unspecified: Secondary | ICD-10-CM

## 2019-12-24 DIAGNOSIS — R490 Dysphonia: Secondary | ICD-10-CM | POA: Diagnosis present

## 2019-12-24 DIAGNOSIS — R1310 Dysphagia, unspecified: Secondary | ICD-10-CM | POA: Diagnosis present

## 2019-12-24 DIAGNOSIS — F314 Bipolar disorder, current episode depressed, severe, without psychotic features: Secondary | ICD-10-CM | POA: Diagnosis present

## 2019-12-24 DIAGNOSIS — Z79899 Other long term (current) drug therapy: Secondary | ICD-10-CM

## 2019-12-24 DIAGNOSIS — G9341 Metabolic encephalopathy: Secondary | ICD-10-CM | POA: Diagnosis present

## 2019-12-24 DIAGNOSIS — Z825 Family history of asthma and other chronic lower respiratory diseases: Secondary | ICD-10-CM | POA: Diagnosis not present

## 2019-12-24 DIAGNOSIS — J9602 Acute respiratory failure with hypercapnia: Secondary | ICD-10-CM

## 2019-12-24 DIAGNOSIS — Y95 Nosocomial condition: Secondary | ICD-10-CM | POA: Diagnosis present

## 2019-12-24 DIAGNOSIS — Z888 Allergy status to other drugs, medicaments and biological substances status: Secondary | ICD-10-CM

## 2019-12-24 DIAGNOSIS — J9621 Acute and chronic respiratory failure with hypoxia: Secondary | ICD-10-CM | POA: Diagnosis present

## 2019-12-24 DIAGNOSIS — Z978 Presence of other specified devices: Secondary | ICD-10-CM

## 2019-12-24 DIAGNOSIS — R000 Tachycardia, unspecified: Secondary | ICD-10-CM | POA: Diagnosis not present

## 2019-12-24 DIAGNOSIS — Z0189 Encounter for other specified special examinations: Secondary | ICD-10-CM

## 2019-12-24 DIAGNOSIS — M1990 Unspecified osteoarthritis, unspecified site: Secondary | ICD-10-CM | POA: Diagnosis present

## 2019-12-24 DIAGNOSIS — Z89412 Acquired absence of left great toe: Secondary | ICD-10-CM

## 2019-12-24 DIAGNOSIS — Z8249 Family history of ischemic heart disease and other diseases of the circulatory system: Secondary | ICD-10-CM | POA: Diagnosis not present

## 2019-12-24 DIAGNOSIS — Z5329 Procedure and treatment not carried out because of patient's decision for other reasons: Secondary | ICD-10-CM | POA: Diagnosis not present

## 2019-12-24 DIAGNOSIS — Z781 Physical restraint status: Secondary | ICD-10-CM

## 2019-12-24 DIAGNOSIS — J9690 Respiratory failure, unspecified, unspecified whether with hypoxia or hypercapnia: Secondary | ICD-10-CM

## 2019-12-24 DIAGNOSIS — Z8711 Personal history of peptic ulcer disease: Secondary | ICD-10-CM

## 2019-12-24 DIAGNOSIS — I110 Hypertensive heart disease with heart failure: Secondary | ICD-10-CM | POA: Diagnosis present

## 2019-12-24 DIAGNOSIS — F419 Anxiety disorder, unspecified: Secondary | ICD-10-CM | POA: Diagnosis present

## 2019-12-24 DIAGNOSIS — J9622 Acute and chronic respiratory failure with hypercapnia: Secondary | ICD-10-CM | POA: Diagnosis present

## 2019-12-24 DIAGNOSIS — R0902 Hypoxemia: Secondary | ICD-10-CM

## 2019-12-24 DIAGNOSIS — J9601 Acute respiratory failure with hypoxia: Secondary | ICD-10-CM

## 2019-12-24 DIAGNOSIS — Z818 Family history of other mental and behavioral disorders: Secondary | ICD-10-CM

## 2019-12-24 DIAGNOSIS — E7220 Disorder of urea cycle metabolism, unspecified: Secondary | ICD-10-CM | POA: Diagnosis present

## 2019-12-24 DIAGNOSIS — K219 Gastro-esophageal reflux disease without esophagitis: Secondary | ICD-10-CM | POA: Diagnosis present

## 2019-12-24 DIAGNOSIS — D638 Anemia in other chronic diseases classified elsewhere: Secondary | ICD-10-CM | POA: Diagnosis present

## 2019-12-24 DIAGNOSIS — Z880 Allergy status to penicillin: Secondary | ICD-10-CM

## 2019-12-24 DIAGNOSIS — D6489 Other specified anemias: Secondary | ICD-10-CM | POA: Diagnosis present

## 2019-12-24 LAB — MAGNESIUM
Magnesium: 2.5 mg/dL — ABNORMAL HIGH (ref 1.7–2.4)
Magnesium: 2.4 mg/dL (ref 1.7–2.4)

## 2019-12-24 LAB — CBC WITH DIFFERENTIAL/PLATELET
Abs Immature Granulocytes: 0.02 10*3/uL (ref 0.00–0.07)
Basophils Absolute: 0 10*3/uL (ref 0.0–0.1)
Basophils Relative: 0 %
Eosinophils Absolute: 0 10*3/uL (ref 0.0–0.5)
Eosinophils Relative: 0 %
HCT: 39.6 % (ref 39.0–52.0)
Hemoglobin: 11.7 g/dL — ABNORMAL LOW (ref 13.0–17.0)
Immature Granulocytes: 0 %
Lymphocytes Relative: 3 %
Lymphs Abs: 0.2 10*3/uL — ABNORMAL LOW (ref 0.7–4.0)
MCH: 28.5 pg (ref 26.0–34.0)
MCHC: 29.5 g/dL — ABNORMAL LOW (ref 30.0–36.0)
MCV: 96.4 fL (ref 80.0–100.0)
Monocytes Absolute: 0.1 10*3/uL (ref 0.1–1.0)
Monocytes Relative: 2 %
Neutro Abs: 6.4 10*3/uL (ref 1.7–7.7)
Neutrophils Relative %: 95 %
Platelets: 226 10*3/uL (ref 150–400)
RBC: 4.11 MIL/uL — ABNORMAL LOW (ref 4.22–5.81)
RDW: 12.2 % (ref 11.5–15.5)
WBC: 6.7 10*3/uL (ref 4.0–10.5)
nRBC: 0 % (ref 0.0–0.2)

## 2019-12-24 LAB — COMPREHENSIVE METABOLIC PANEL
ALT: 19 U/L (ref 0–44)
AST: 26 U/L (ref 15–41)
Albumin: 3.4 g/dL — ABNORMAL LOW (ref 3.5–5.0)
Alkaline Phosphatase: 82 U/L (ref 38–126)
Anion gap: 14 (ref 5–15)
BUN: 10 mg/dL (ref 6–20)
CO2: 42 mmol/L — ABNORMAL HIGH (ref 22–32)
Calcium: 9.4 mg/dL (ref 8.9–10.3)
Chloride: 86 mmol/L — ABNORMAL LOW (ref 98–111)
Creatinine, Ser: 0.5 mg/dL — ABNORMAL LOW (ref 0.61–1.24)
GFR calc Af Amer: >60 mL/min (ref >60)
GFR calc non Af Amer: >60 mL/min (ref >60)
Glucose, Bld: 121 mg/dL — ABNORMAL HIGH (ref 70–99)
Potassium: 4.5 mmol/L (ref 3.5–5.1)
Sodium: 142 mmol/L (ref 135–145)
Total Bilirubin: 0.6 mg/dL (ref 0.3–1.2)
Total Protein: 7.1 g/dL (ref 6.5–8.1)

## 2019-12-24 LAB — ETHANOL: Alcohol, Ethyl (B): <10 mg/dL (ref <10)

## 2019-12-24 LAB — RAPID URINE DRUG SCREEN, HOSP PERFORMED
Amphetamines: NOT DETECTED
Barbiturates: NOT DETECTED
Benzodiazepines: NOT DETECTED
Cocaine: NOT DETECTED
Opiates: NOT DETECTED
Tetrahydrocannabinol: NOT DETECTED

## 2019-12-24 LAB — CBC
HCT: 37.2 % — ABNORMAL LOW (ref 39.0–52.0)
Hemoglobin: 11.1 g/dL — ABNORMAL LOW (ref 13.0–17.0)
MCH: 28.9 pg (ref 26.0–34.0)
MCHC: 29.8 g/dL — ABNORMAL LOW (ref 30.0–36.0)
MCV: 96.9 fL (ref 80.0–100.0)
Platelets: 208 10*3/uL (ref 150–400)
RBC: 3.84 MIL/uL — ABNORMAL LOW (ref 4.22–5.81)
RDW: 12.4 % (ref 11.5–15.5)
WBC: 4.2 10*3/uL (ref 4.0–10.5)
nRBC: 0 % (ref 0.0–0.2)

## 2019-12-24 LAB — RESPIRATORY PANEL BY RT PCR (FLU A&B, COVID)
Influenza A by PCR: NEGATIVE
Influenza B by PCR: NEGATIVE
SARS Coronavirus 2 by RT PCR: NEGATIVE

## 2019-12-24 LAB — BLOOD GAS, ARTERIAL
Acid-Base Excess: 25.2 mmol/L — ABNORMAL HIGH (ref 0.0–2.0)
Acid-Base Excess: 19.7 mmol/L — ABNORMAL HIGH (ref 0.0–2.0)
Bicarbonate: 44.6 mmol/L — ABNORMAL HIGH (ref 20.0–28.0)
Bicarbonate: 42.7 mmol/L — ABNORMAL HIGH (ref 20.0–28.0)
FIO2: 36
FIO2: 100
O2 Saturation: 30.6 %
O2 Saturation: 99.3 %
Patient temperature: 36.5
Patient temperature: 37.2
pCO2 arterial: 107 mmHg (ref 32.0–48.0)
pCO2 arterial: 61.2 mmHg — ABNORMAL HIGH (ref 32.0–48.0)
pH, Arterial: 7.311 — ABNORMAL LOW (ref 7.350–7.450)
pH, Arterial: 7.479 — ABNORMAL HIGH (ref 7.350–7.450)
pO2, Arterial: <31 mmHg — CL (ref 83.0–108.0)
pO2, Arterial: 155 mmHg — ABNORMAL HIGH (ref 83.0–108.0)

## 2019-12-24 LAB — D-DIMER, QUANTITATIVE: D-Dimer, Quant: 1.18 ug/mL-FEU — ABNORMAL HIGH (ref 0.00–0.50)

## 2019-12-24 LAB — PHOSPHORUS
Phosphorus: 2.5 mg/dL (ref 2.5–4.6)
Phosphorus: 0.7 mg/dL — CL (ref 2.5–4.6)

## 2019-12-24 LAB — POC SARS CORONAVIRUS 2 AG -  ED: SARS Coronavirus 2 Ag: NEGATIVE

## 2019-12-24 LAB — TRIGLYCERIDES: Triglycerides: 84 mg/dL (ref <150)

## 2019-12-24 LAB — GLUCOSE, CAPILLARY: Glucose-Capillary: 144 mg/dL — ABNORMAL HIGH (ref 70–99)

## 2019-12-24 LAB — LACTIC ACID, PLASMA
Lactic Acid, Venous: 1.7 mmol/L (ref 0.5–1.9)
Lactic Acid, Venous: 3.2 mmol/L (ref 0.5–1.9)
Lactic Acid, Venous: 3.6 mmol/L (ref 0.5–1.9)

## 2019-12-24 LAB — AMMONIA: Ammonia: 56 umol/L — ABNORMAL HIGH (ref 9–35)

## 2019-12-24 LAB — TROPONIN I (HIGH SENSITIVITY)
Troponin I (High Sensitivity): 6 ng/L (ref <18)
Troponin I (High Sensitivity): 6 ng/L (ref <18)

## 2019-12-24 LAB — BRAIN NATRIURETIC PEPTIDE: B Natriuretic Peptide: 400 pg/mL — ABNORMAL HIGH (ref 0.0–100.0)

## 2019-12-24 LAB — VALPROIC ACID LEVEL: Valproic Acid Lvl: 31 ug/mL — ABNORMAL LOW (ref 50.0–100.0)

## 2019-12-24 MED ORDER — THIAMINE HCL 100 MG PO TABS: 100.0000 mg | ORAL_TABLET | Freq: Every day | ORAL | Status: DC

## 2019-12-24 MED ORDER — ENOXAPARIN SODIUM 40 MG/0.4ML ~~LOC~~ SOLN
40.0000 mg | SUBCUTANEOUS | Status: DC
  Administered 2019-12-24 – 2020-01-09 (×14): 40 mg via SUBCUTANEOUS
  Filled 2019-12-24 (×14): qty 0.4

## 2019-12-24 MED ORDER — ADULT MULTIVITAMIN LIQUID CH
15.0000 mL | Freq: Every day | ORAL | Status: DC
  Administered 2019-12-25 – 2019-12-26 (×2): 15 mL
  Filled 2019-12-24 (×2): qty 15

## 2019-12-24 MED ORDER — FOLIC ACID 1 MG PO TABS: 1.0000 mg | ORAL_TABLET | Freq: Every day | ORAL | Status: DC

## 2019-12-24 MED ORDER — ALBUTEROL SULFATE HFA 108 (90 BASE) MCG/ACT IN AERS
4.0000 | INHALATION_SPRAY | Freq: Once | RESPIRATORY_TRACT | Status: AC
  Administered 2019-12-24: 4 via RESPIRATORY_TRACT
  Filled 2019-12-24: qty 6.7

## 2019-12-24 MED ORDER — ADULT MULTIVITAMIN W/MINERALS CH
1.0000 | ORAL_TABLET | Freq: Every day | ORAL | Status: DC
  Filled 2019-12-24: qty 1

## 2019-12-24 MED ORDER — CHLORHEXIDINE GLUCONATE 0.12% ORAL RINSE (MEDLINE KIT)
15.0000 mL | Freq: Two times a day (BID) | OROMUCOSAL | Status: DC
  Administered 2019-12-24 – 2019-12-26 (×4): 15 mL via OROMUCOSAL

## 2019-12-24 MED ORDER — SODIUM CHLORIDE 0.9 % IV BOLUS
1000.0000 mL | Freq: Once | INTRAVENOUS | Status: AC
  Administered 2019-12-24: 1000 mL via INTRAVENOUS

## 2019-12-24 MED ORDER — POLYETHYLENE GLYCOL 3350 17 G PO PACK: 17.0000 g | PACK | Freq: Every day | ORAL | Status: DC | PRN

## 2019-12-24 MED ORDER — LORAZEPAM 2 MG/ML IJ SOLN
1.0000 mg | INTRAMUSCULAR | Status: DC | PRN
  Administered 2019-12-24: 1 mg via INTRAVENOUS

## 2019-12-24 MED ORDER — SODIUM PHOSPHATES 45 MMOLE/15ML IV SOLN
20.0000 mmol | Freq: Once | INTRAVENOUS | Status: DC
  Filled 2019-12-24: qty 6.67

## 2019-12-24 MED ORDER — PIPERACILLIN-TAZOBACTAM 3.375 G IVPB
3.3750 g | Freq: Three times a day (TID) | INTRAVENOUS | Status: DC
  Administered 2019-12-24 – 2019-12-30 (×17): 3.375 g via INTRAVENOUS
  Filled 2019-12-24 (×17): qty 50

## 2019-12-24 MED ORDER — MAGNESIUM SULFATE 2 GM/50ML IV SOLN
2.0000 g | Freq: Once | INTRAVENOUS | Status: AC
  Administered 2019-12-24: 2 g via INTRAVENOUS
  Filled 2019-12-24: qty 50

## 2019-12-24 MED ORDER — LORAZEPAM 1 MG PO TABS: 1.0000 mg | ORAL_TABLET | ORAL | Status: DC | PRN

## 2019-12-24 MED ORDER — LORAZEPAM 2 MG/ML IJ SOLN: 0.0000 mg | Freq: Two times a day (BID) | INTRAMUSCULAR | Status: DC

## 2019-12-24 MED ORDER — LORAZEPAM 2 MG/ML IJ SOLN
0.0000 mg | Freq: Four times a day (QID) | INTRAMUSCULAR | Status: DC
  Filled 2019-12-24: qty 1

## 2019-12-24 MED ORDER — ONDANSETRON HCL 4 MG PO TABS: 4.0000 mg | ORAL_TABLET | Freq: Four times a day (QID) | ORAL | Status: DC | PRN

## 2019-12-24 MED ORDER — DEXTROSE-NACL 5-0.45 % IV SOLN: INTRAVENOUS | Status: DC

## 2019-12-24 MED ORDER — THIAMINE HCL 100 MG/ML IJ SOLN
100.0000 mg | Freq: Every day | INTRAMUSCULAR | Status: DC
  Administered 2019-12-25 – 2019-12-26 (×2): 100 mg via INTRAVENOUS
  Filled 2019-12-24 (×2): qty 2

## 2019-12-24 MED ORDER — SODIUM CHLORIDE 0.9 % IV BOLUS
1000.0000 mL | Freq: Once | INTRAVENOUS | Status: AC
  Administered 2019-12-24: 23:00:00 1000 mL via INTRAVENOUS

## 2019-12-24 MED ORDER — FENTANYL CITRATE (PF) 100 MCG/2ML IJ SOLN: 50.0000 ug | INTRAMUSCULAR | Status: DC | PRN

## 2019-12-24 MED ORDER — THIAMINE HCL 100 MG/ML IJ SOLN
100.0000 mg | Freq: Every day | INTRAMUSCULAR | Status: DC
  Administered 2019-12-24: 100 mg via INTRAVENOUS
  Filled 2019-12-24: qty 2

## 2019-12-24 MED ORDER — ACETAMINOPHEN 325 MG PO TABS: 650.0000 mg | ORAL_TABLET | Freq: Four times a day (QID) | ORAL | Status: DC | PRN

## 2019-12-24 MED ORDER — ORAL CARE MOUTH RINSE
15.0000 mL | OROMUCOSAL | Status: DC
  Administered 2019-12-24 – 2019-12-26 (×17): 15 mL via OROMUCOSAL

## 2019-12-24 MED ORDER — DIVALPROEX SODIUM ER 500 MG PO TB24
1500.0000 mg | ORAL_TABLET | Freq: Every day | ORAL | Status: DC
  Filled 2019-12-24: qty 3

## 2019-12-24 MED ORDER — VANCOMYCIN HCL 1500 MG/300ML IV SOLN
1500.0000 mg | Freq: Once | INTRAVENOUS | Status: AC
  Administered 2019-12-24: 1500 mg via INTRAVENOUS
  Filled 2019-12-24: qty 300

## 2019-12-24 MED ORDER — LORAZEPAM 2 MG/ML IJ SOLN
1.0000 mg | Freq: Once | INTRAMUSCULAR | Status: AC
  Administered 2019-12-24: 1 mg via INTRAVENOUS
  Filled 2019-12-24: qty 1

## 2019-12-24 MED ORDER — IPRATROPIUM BROMIDE 0.02 % IN SOLN
RESPIRATORY_TRACT | Status: AC
  Administered 2019-12-24: 0.5 mg
  Filled 2019-12-24: qty 2.5

## 2019-12-24 MED ORDER — ADULT MULTIVITAMIN W/MINERALS CH: 1.0000 | ORAL_TABLET | Freq: Every day | ORAL | Status: DC

## 2019-12-24 MED ORDER — FAMOTIDINE IN NACL 20-0.9 MG/50ML-% IV SOLN
20.0000 mg | Freq: Two times a day (BID) | INTRAVENOUS | Status: DC
  Administered 2019-12-24 – 2019-12-25 (×2): 20 mg via INTRAVENOUS
  Filled 2019-12-24 (×2): qty 50

## 2019-12-24 MED ORDER — ROCURONIUM BROMIDE 50 MG/5ML IV SOLN
100.0000 mg | Freq: Once | INTRAVENOUS | Status: AC
  Administered 2019-12-24: 100 mg via INTRAVENOUS
  Filled 2019-12-24: qty 10

## 2019-12-24 MED ORDER — ACETAMINOPHEN 650 MG RE SUPP: 650.0000 mg | Freq: Four times a day (QID) | RECTAL | Status: DC | PRN

## 2019-12-24 MED ORDER — FOLIC ACID 1 MG PO TABS
1.0000 mg | ORAL_TABLET | Freq: Every day | ORAL | Status: DC
  Administered 2019-12-25 – 2019-12-26 (×2): 1 mg
  Filled 2019-12-24 (×2): qty 1

## 2019-12-24 MED ORDER — FENTANYL CITRATE (PF) 100 MCG/2ML IJ SOLN
50.0000 ug | INTRAMUSCULAR | Status: DC | PRN
  Administered 2019-12-25: 200 ug via INTRAVENOUS
  Administered 2019-12-25 – 2019-12-26 (×3): 100 ug via INTRAVENOUS
  Filled 2019-12-24: qty 2
  Filled 2019-12-24: qty 4
  Filled 2019-12-24 (×2): qty 2

## 2019-12-24 MED ORDER — METHYLPREDNISOLONE SODIUM SUCC 125 MG IJ SOLR: 60.0000 mg | Freq: Two times a day (BID) | INTRAMUSCULAR | Status: DC

## 2019-12-24 MED ORDER — SODIUM CHLORIDE 0.9 % IV SOLN
1.0000 g | Freq: Once | INTRAVENOUS | Status: AC
  Administered 2019-12-24: 1 g via INTRAVENOUS
  Filled 2019-12-24 (×2): qty 1

## 2019-12-24 MED ORDER — ALBUTEROL SULFATE (2.5 MG/3ML) 0.083% IN NEBU
INHALATION_SOLUTION | RESPIRATORY_TRACT | Status: AC
  Administered 2019-12-24: 5 mg
  Filled 2019-12-24: qty 6

## 2019-12-24 MED ORDER — GUAIFENESIN-DM 100-10 MG/5ML PO SYRP
10.0000 mL | ORAL_SOLUTION | Freq: Three times a day (TID) | ORAL | Status: DC
  Administered 2019-12-24 – 2019-12-25 (×2): 10 mL
  Filled 2019-12-24 (×2): qty 10

## 2019-12-24 MED ORDER — ONDANSETRON HCL 4 MG/2ML IJ SOLN: 4.0000 mg | Freq: Four times a day (QID) | INTRAMUSCULAR | Status: DC | PRN

## 2019-12-24 MED ORDER — FLUOXETINE HCL 20 MG PO CAPS
40.0000 mg | ORAL_CAPSULE | Freq: Every day | ORAL | Status: DC
  Administered 2019-12-25: 40 mg via ORAL
  Filled 2019-12-24: qty 2

## 2019-12-24 MED ORDER — METHYLPREDNISOLONE SODIUM SUCC 125 MG IJ SOLR
60.0000 mg | Freq: Two times a day (BID) | INTRAMUSCULAR | Status: DC
  Administered 2019-12-25 – 2019-12-28 (×7): 60 mg via INTRAVENOUS
  Filled 2019-12-24 (×7): qty 2

## 2019-12-24 MED ORDER — ALBUTEROL SULFATE (2.5 MG/3ML) 0.083% IN NEBU: 2.5000 mg | INHALATION_SOLUTION | Freq: Four times a day (QID) | RESPIRATORY_TRACT | Status: DC

## 2019-12-24 MED ORDER — FENTANYL CITRATE (PF) 100 MCG/2ML IJ SOLN: 100.0000 ug | INTRAMUSCULAR | Status: DC | PRN

## 2019-12-24 MED ORDER — OLANZAPINE 5 MG PO TABS
10.0000 mg | ORAL_TABLET | Freq: Every day | ORAL | Status: DC
  Administered 2019-12-24: 10 mg via ORAL
  Filled 2019-12-24: qty 2

## 2019-12-24 MED ORDER — ETOMIDATE 2 MG/ML IV SOLN
20.0000 mg | Freq: Once | INTRAVENOUS | Status: AC
  Administered 2019-12-24: 20 mg via INTRAVENOUS

## 2019-12-24 MED ORDER — VANCOMYCIN HCL IN DEXTROSE 1-5 GM/200ML-% IV SOLN
1000.0000 mg | Freq: Two times a day (BID) | INTRAVENOUS | Status: DC
  Administered 2019-12-25: 1000 mg via INTRAVENOUS
  Filled 2019-12-24: qty 200

## 2019-12-24 MED ORDER — PROPOFOL 1000 MG/100ML IV EMUL
0.0000 ug/kg/min | INTRAVENOUS | Status: DC
  Administered 2019-12-24: 50 ug/kg/min via INTRAVENOUS
  Administered 2019-12-24: 5 ug/kg/min via INTRAVENOUS
  Administered 2019-12-25: 09:00:00 45 ug/kg/min via INTRAVENOUS
  Administered 2019-12-25: 50 ug/kg/min via INTRAVENOUS
  Administered 2019-12-25 (×2): 45 ug/kg/min via INTRAVENOUS
  Administered 2019-12-25: 18.87 ug/kg/min via INTRAVENOUS
  Administered 2019-12-26: 47.687 ug/kg/min via INTRAVENOUS
  Administered 2019-12-26: 40 ug/kg/min via INTRAVENOUS
  Filled 2019-12-24 (×9): qty 100

## 2019-12-24 MED ORDER — FENTANYL CITRATE (PF) 100 MCG/2ML IJ SOLN
100.0000 ug | INTRAMUSCULAR | Status: DC | PRN
  Administered 2019-12-24: 100 ug via INTRAVENOUS
  Filled 2019-12-24: qty 2

## 2019-12-24 NOTE — ED Notes (Signed)
Date and time results received: 12/24/19 4:38 PM  (use smartphrase ".now" to insert current time)  Test: PO2  Critical Value: <31.0  Name of Provider Notified: Rancour, MD  Orders Received? Or Actions Taken?: na

## 2019-12-24 NOTE — H&P (Addendum)
History and Physical    Jeffrey [name] EXB:[id number] DOB: 1961/02/29 DOA: 12/24/2019  PCP: Jeffrey Morgan, MD   Patient coming from: Home  I have personally briefly reviewed patient's old medical records in [facility]  Chief Complaint: AMS, difficulty breathing  HPI: Jeffrey [name] is a 59 y.o. male with medical history significant for COPD with chronic respiratory failure, bipolar disorder, diastolic CHF, hepatic encephalopathy, depression. History is obtained from chart review and talking to patient's ex-wife.  Per chart review patient was brought into the ED reports of difficulty breathing over the past 3 days, with confusion, and in the ED patient was tremulous.   Patient's ex-wife who now lives with patient tells me patient has not had any drink or use any drugs in the past year.  She reports increasing difficulty breathing and productive cough.  No vomiting no loose stools.  She said the past 3 days when patient was confused tells when he said he wanted someone to shoot him.  She otherwise denies suicidal ideations.  She also reports patient has gotten tired of using oxygen, his bronchodilators, and lying around as normally he likes to be active.  Admission 11/19- 11/23 for acute hypercapnic respiratory failure secondary to COPD exacerbation and component of CHF.  Required Precedex drip for agitation.  Discharged home on 3 to 4 L nasal cannula which was his baseline.  ED Course: Tachypneic, O2 sats 91 to 95% on nasal cannula 4 L.  But with worsening mental status, patient not responding, and then agitated, pulling at lines and tubes, decision was made to intubate patient as patient would not tolerate BiPAP.  And sedated with propofol.  ABG showed pH of 7.3, marked elevated PCO2 of 107.  PaO2 < 31.  Covid test negative.  Portable chest x-ray shows left greater than right base airspace disease, aspiration or infection favored. IV vancomycin and cefepime started for HCAP.  Review of  Systems: Unable to ascertain, patient intubated and sedated.  Past Medical History:  Diagnosis Date  . Acute blood loss anemia 02/07/2017  . Anxiety   . Arthritis    deg disease, bulging disk,  shoulder level  . Bipolar disorder (HCC)   . Bipolar disorder (HCC)   . CHF (congestive heart failure) (HCC)   . 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 Depression    anxiety  . Hyperlipidemia   . Hypertension   . Peptic ulcer disease    Review  . Pneumonia   . [name] (peripheral vascular disease) (HCC) 06/18/2015  . Shortness of breath     Past Surgical History:  Procedure Laterality Date  . BIOPSY  02/09/2017   Procedure: BIOPSY;  Surgeon: [name], MD;  Location: [facility];  Service: Endoscopy;;  duodenal gastric  . COLONOSCOPY  03/14/2007   [id number] colonoscopy and terminal ileoscopy except external hemorrhoids  . COLONOSCOPY N/A 05/05/2013   [name]: external/internal anal canal hemorrhoids, unable to intubate TI, segemental biopsies unremarkable   . COLONOSCOPY N/A 04/11/2017   Procedure: COLONOSCOPY;  Surgeon: [name], MD;  Location: [facility];  Service: Endoscopy;  Laterality: N/A;  . COLONOSCOPY WITH PROPOFOL N/A 03/31/2017   Procedure: COLONOSCOPY WITH PROPOFOL;  Surgeon: [name], MD;  Location: [facility];  Service: Endoscopy;  Laterality: N/A;  1:45pm  . ESOPHAGOGASTRODUODENOSCOPY  03/14/2007   [id number] antral gastritis with bulbar duodenitis/paucity to postbulbar duodenal folds and biopsy were benign with no evidence of villous atrophy.  . ESOPHAGOGASTRODUODENOSCOPY (EGD)  WITH PROPOFOL N/A 02/09/2017   Procedure: ESOPHAGOGASTRODUODENOSCOPY (EGD) WITH PROPOFOL;  Surgeon: [name], MD;  Location: [facility];  Service: Endoscopy;  Laterality: N/A;  . GIVENS CAPSULE STUDY N/A 04/08/2017   Procedure: GIVENS CAPSULE STUDY;  Surgeon: [name], MD;  Location: [facility];  Service: Endoscopy;  Laterality: N/A;  . HAND SURGERY     left,  secondary to self-inflicted laceration  . HEMORRHOID SURGERY N/A 04/14/2017   Procedure: HEMORRHOIDECTOMY;  Surgeon: [name], MD;  Location: [facility];  Service: General;  Laterality: N/A;  . SHOULDER SURGERY     right  . TOE SURGERY     left great toe , amputated-lawnmover accident     reports that he has been smoking cigarettes. He started smoking about 48 years ago. He has a 40.00 pack-year smoking history. He has never used smokeless tobacco. He reports previous drug use. Drug: Marijuana. He reports that he does not drink alcohol.  Allergies  Allergen Reactions  . Augmentin [Amoxicillin-Pot Clavulanate] Rash and Other (See Comments)    Has patient had a PCN reaction causing immediate rash, facial/tongue/throat swelling, SOB or lightheadedness with hypotension: No Has patient had a PCN reaction causing severe rash involving mucus membranes or skin necrosis: No Has patient had a PCN reaction that required hospitalization No Has patient had a PCN reaction occurring within the last 10 years: Yes If all of the above answers are "NO", then may proceed with Cephalosporin use.  Donivan Scull Inhibitors Hives    Family History  Problem Relation Age of Onset  . Breast cancer Mother        deceased  . Heart disease Father   . Depression Daughter   . Anxiety disorder Daughter   . Anxiety disorder Son   . Depression Son   . Asthma Brother   . Heart attack Maternal Aunt   . Heart attack Maternal Uncle   . Heart attack Paternal Aunt   . Heart attack Paternal Uncle   . Heart attack Maternal Grandmother   . Heart attack Maternal Grandfather   . Emphysema Maternal Grandfather   . Heart attack Paternal Grandmother   . Heart attack Paternal Grandfather   . Colon cancer Neg Hx   . Liver disease Neg Hx     Prior to Admission medications   Medication Sig Start Date End Date Taking? Authorizing Provider  albuterol (PROVENTIL) (2.5 MG/3ML) 0.083% nebulizer solution Take 3 mLs (2.5 mg total) by  nebulization every 4 (four) hours as needed for shortness of breath. 01/13/19  Yes Mikhail, Nita Sells, DO  albuterol (VENTOLIN HFA) 108 (90 Base) MCG/ACT inhaler Inhale 2 puffs into the lungs every 4 (four) hours as needed for wheezing or shortness of breath. 12/04/19  Yes Eber Hong, MD  aspirin EC 81 MG tablet Take 81 mg by mouth every evening.    Yes [provider]  atorvastatin (LIPITOR) 20 MG tablet Take 1 tablet (20 mg total) by mouth daily. For high cholesterol Patient taking differently: Take 20 mg by mouth every evening. For high cholesterol 02/09/18  Yes Money, Gerlene Burdock, FNP  divalproex (DEPAKOTE ER) 500 MG 24 hr tablet Take 3 tablets (1,500 mg total) by mouth at bedtime. 11/14/19  Yes Myrlene Broker, MD  doxycycline (VIBRA-TABS) 100 MG tablet Take 100 mg by mouth 2 (two) times daily. 12/22/19  Yes [provider]  FLUoxetine (PROZAC) 40 MG capsule Take 1 capsule (40 mg total) by mouth daily. For mood control Patient  taking differently: Take 40 mg by mouth every morning. For mood control 11/14/19  Yes Myrlene Broker, MD  folic acid (FOLVITE) 1 MG tablet Take 1 mg by mouth every morning.  11/29/19  Yes [provider]  gabapentin (NEURONTIN) 400 MG capsule Take 1 capsule (400 mg total) by mouth 3 (three) times daily. 11/14/19  Yes Myrlene Broker, MD  guaiFENesin (MUCINEX) 600 MG 12 hr tablet Take 600 mg by mouth 2 (two) times daily as needed for cough or to loosen phlegm.    Yes [provider]  ipratropium (ATROVENT) 0.02 % nebulizer solution Take 2.5 mLs (0.5 mg total) by nebulization 3 (three) times daily. 01/13/19  Yes Mikhail, [place], DO  metoprolol tartrate (LOPRESSOR) 25 MG tablet Take 1 tablet (25 mg total) by mouth 2 (two) times daily. Patient taking differently: Take 25 mg by mouth every morning.  03/07/19  Yes Rhetta Mura, MD  Multiple Vitamin (MULTIVITAMIN WITH MINERALS) TABS tablet Take 1 tablet by mouth daily. Patient taking  differently: Take 1 tablet by mouth every morning.  01/13/19  Yes Mikhail, Marissa, DO  Nutritional Supplements (BOOST NUTRITIONAL ENERGY PO) Take 237 mLs by mouth 2 (two) times daily.   Yes [provider]  OLANZapine (ZYPREXA) 10 MG tablet Take 1.5 tablets (15 mg total) by mouth at bedtime. For mood stability Patient taking differently: Take 10 mg by mouth at bedtime. For mood stability 11/14/19  Yes Myrlene Broker, MD  pantoprazole (PROTONIX) 20 MG tablet Take 20 mg by mouth every morning.  11/29/19  Yes [provider]  potassium chloride SA (KLOR-CON) 20 MEQ tablet Take 20 mEq by mouth every morning.  07/17/19  Yes [provider]  traZODone (DESYREL) 50 MG tablet Take 2 tablets (100 mg total) by mouth at bedtime. 11/14/19  Yes Myrlene Broker, MD  vitamin B-12 (CYANOCOBALAMIN) 500 MCG tablet Take 1,000 mcg by mouth every morning.    Yes [provider]  doxycycline (VIBRAMYCIN) 100 MG capsule Take 1 capsule (100 mg total) by mouth 2 (two) times daily. Patient not taking: Reported on 12/24/2019 12/07/19   Gilda Crease, MD  feeding supplement, ENSURE ENLIVE, (ENSURE ENLIVE) LIQD Take 237 mLs by mouth 2 (two) times daily between meals. Patient not taking: Reported on 12/24/2019 03/30/19   Maurilio Lovely D, DO  furosemide (LASIX) 20 MG tablet Take 2 tablets (40 mg total) by mouth daily for 30 days. 05/02/19 12/04/19  Sherryll Burger, Pratik D, DO  predniSONE (DELTASONE) 20 MG tablet 3 tabs po daily x 3 days, then 2 tabs x 3 days, then 1.5 tabs x 3 days, then 1 tab x 3 days, then 0.5 tabs x 3 days Patient not taking: Reported on 12/24/2019 12/07/19   Gilda Crease, MD    Physical Exam: Vitals:   12/24/19 1830 12/24/19 1845 12/24/19 1857 12/24/19 1900  BP:  120/72  93/63  Pulse: 91 84  81  Resp: (!) 22 (!) 24  (!) 24  Temp: 98.4 F (36.9 C)     TempSrc: Core     SpO2: 100% 100% 100% 100%  Weight:      Height:        Constitutional: Sedated, on  ventilator Vitals:   12/24/19 1830 12/24/19 1845 12/24/19 1857 12/24/19 1900  BP:  120/72  93/63  Pulse: 91 84  81  Resp: (!) 22 (!) 24  (!) 24  Temp: 98.4 F (36.9 C)     TempSrc: Core  SpO2: 100% 100% 100% 100%  Weight:      Height:       Eyes: PERRL, lids and conjunctivae normal ENMT: Intubated . Neck: normal, supple, no masses, no thyromegaly Respiratory: Sedated and on ventilator, equal chest rise Cardiovascular: Regular rate and rhythm,  No extremity edema. 2+ pedal pulses. .  Abdomen: Full, and firm considering patient is sedated, tenderness not elicited, no masses palpated. No hepatosplenomegaly  Musculoskeletal: no clubbing / cyanosis. No joint deformity upper and lower extremities.  Skin: no rashes, lesions, ulcers. No induration Neurologic: sedated. Psychiatric: Sedated.  Labs on Admission: I have personally reviewed following labs and imaging studies  CBC: Recent Labs  Lab 12/24/19 1703  WBC 6.7  NEUTROABS 6.4  HGB 11.7*  HCT 39.6  MCV 96.4  PLT 226   Basic Metabolic Panel: Recent Labs  Lab 12/24/19 1703  NA 142  K 4.5  CL 86*  CO2 42*  GLUCOSE 121*  BUN 10  CREATININE 0.50*  CALCIUM 9.4  PHOS 2.5   Liver Function Tests: Recent Labs  Lab 12/24/19 1703  AST 26  ALT 19  ALKPHOS 82  BILITOT 0.6  PROT 7.1  ALBUMIN 3.4*   No results for input(s): LIPASE, AMYLASE in the last 168 hours. Recent Labs  Lab 12/24/19 1703  AMMONIA 56*   Lipid Profile: Recent Labs    12/24/19 1734  TRIG 84   Urine analysis:    Component Value Date/Time   COLORURINE YELLOW 11/21/2019 1815   APPEARANCEUR HAZY (A) 11/21/2019 1815   LABSPEC 1.016 11/21/2019 1815   PHURINE 8.0 11/21/2019 1815   GLUCOSEU NEGATIVE 11/21/2019 1815   HGBUR NEGATIVE 11/21/2019 1815   BILIRUBINUR NEGATIVE 11/21/2019 1815   KETONESUR NEGATIVE 11/21/2019 1815   PROTEINUR NEGATIVE 11/21/2019 1815   UROBILINOGEN 0.2 03/01/2011 1427   NITRITE NEGATIVE 11/21/2019 1815    LEUKOCYTESUR NEGATIVE 11/21/2019 1815    Radiological Exams on Admission: DG Chest 1V REPEAT Same Day  Result Date: 12/24/2019 CLINICAL DATA:  Post intubation. EXAM: CHEST - 1 VIEW SAME DAY COMPARISON:  Multiple chest x-rays since February 26, 2019 FINDINGS: The distal tip of the ET tube is difficult to see with confidence but appears to terminate 8.3 cm above the carina and 5 cm below the thoracic inlet, in adequate position. The NG tube terminates below today's film. No pneumothorax. Coarsened lung markings are seen bilaterally. The focal opacity in the right base seen earlier today is no longer visualized. No focal infiltrate seen in the left base either. There is thickening of the left pleural stripe near the base which is new since April of 2020. The cardiomediastinal silhouette is stable. IMPRESSION: 1. No focal infiltrates. 2. Thickening of the left inferior pleural stripe is consistent with pleural fluid, suggestive of loculation. 3. Coarsened lung markings are similar since December 04, 2019 and consistent with the patient's underlying emphysema. A superimposed atypical infection or pulmonary venous congestion are not excluded. Electronically Signed   By: Gerome Samavid  Williams III M.D   On: 12/24/2019 17:39   DG Chest Portable 1 View  Result Date: 12/24/2019 CLINICAL DATA:  Short of breath for 3 days. COVID-19 negative. CHF. COPD. Recurrent pneumonia. EXAM: PORTABLE CHEST 1 VIEW COMPARISON:  12/07/2019.  Chest CT of 03/26/2019 FINDINGS: Midline trachea. Normal heart size. No pneumothorax. Small left pleural effusion with possible mild loculation laterally. Moderate diffuse interstitial prominence. Left worse than right base airspace disease. IMPRESSION: Development of left greater than right base airspace disease, superimposed upon chronic  moderate interstitial thickening. Favor infection or aspiration. Small left pleural effusion, with possible loculation laterally. Electronically Signed   By: Abigail Miyamoto  M.D.   On: 12/24/2019 16:20    EKG: Independently reviewed.  Initial EKG sinus rhythm, T waves appear large , QTc 416.  Subsequent EKG with multiple artifacts.  Assessment/Plan Active Problems:   Acute on chronic respiratory failure with hypercapnia (HCC)  Acute on chronic respiratory failure with hypercapnia- likely combination of COPD exacerbation and pneumonia.  POC COVID respiratory panel negative for COVID-19.  Chest x-ray showing L > R base airspace disease, fever and infection or aspiration.  ABG showed respiratory acidosis with pH of 7.3, marked elevated PCO2 of 170, PO2 < 31.  Currently intubated and sedated on propofol. -Ventilator protocol, continue propofol for sedation -Daily chest x-ray -Respiratory therapy consult -Critical care to see in a.m. -N.p.o. - 1/2 N/s D5 75cc/hr -BMP, CBC a.m. -Critical care consult in a.m -D-dimer checked in ED and elevated at 1.18. Likely secondary to pneumonia.  Metabolic encephalopathy- likely secondary to CO2 narcosis and sepsis from pneumonia.  Ammonia level 56-not significantly changed from prior. -Head CT ordered and pending -IV antibiotics  HCAP/aspiration pneumonia with sepsis-as suggested on chest x-ray.  Lactic acid 3.2 > 3.6 - Will add 3rd liter N/s bolus, and continue maintenance fluids -Continue vancomycin and Zosyn to cover for possible aspiration, DC Vanc if MRSA negative - Trend lactic acid - Foley catheter  COPD exacerbation-likely exacerbated by pneumonia.  COVID-19 test negative.  Currently intubated. -IV Solu-Medrol 125mg  given, continue 60 every 12 hourly -Bronchodilators -IV antibiotics  Bipolar disorder, depression, -patient spouse denies suicidal ideation, patient was confused when he voiced asking someone to shoot him. -Resume home Depakote, fluoxetine, Zyprexa via NG tube -May benefit from psych evaluation prior to discharge  History of alcohol abuse-   no alcohol use in the past year.  Diastolic  CHF-stable, compensated.  Last echo 09/2019 EF 65 to 70%.  Currently septic with lactic acidosis requiring IV fluids. -Hold home Lasix for now -Hold home metoprolol with soft blood pressure  CRITICAL CARE Performed by: Bethena Roys   Total critical care time: 70 minutes  Critical care time was exclusive of separately billable procedures and treating other patients.  Critical care was necessary to treat or prevent imminent or life-threatening deterioration.  Critical care was time spent personally by me on the following activities: development of treatment plan with patient and/or surrogate as well as nursing, discussions with consultants, evaluation of patient's response to treatment, examination of patient, obtaining history from patient or surrogate, ordering and performing treatments and interventions, ordering and review of laboratory studies, ordering and review of radiographic studies, pulse oximetry and re-evaluation of patient's condition.   DVT prophylaxis: Lovenox Code Status: Full code Family Communication: Phone communication with patient ex-wife Juel, [place] who resides with patient. Disposition Plan: Pending improvement in respiratory status Consults called: None Admission status: Inpatient, ICU I certify that at the point of admission it is my clinical judgment that the patient will require inpatient hospital care spanning beyond 2 midnights from the point of admission due to high intensity of service, high risk for further deterioration and high frequency of surveillance required. The following factors support the patient status of inpatient: IV antibiotics, respiratory support with ventilator.   Bethena Roys MD [facility]  12/24/2019, 9:00 PM

## 2019-12-24 NOTE — ED Notes (Signed)
CRITICAL VALUE ALERT  Critical Value: LA 3.2 Date & Time Notied:  12/24/19@ 1935 Provider Notified: Dr. Manus Gunning Orders Received/Actions taken:None yet

## 2019-12-24 NOTE — ED Triage Notes (Signed)
Pt presents via EMS for AMS and hypoxia. O2 sats in 80's on room air. Pt has COPD and CHF and wears chronic oxygen at home 3-4 L per EMS.   PT given 125 mg Solumedrol and 2 Duonebs en route to [facility].   Pt states he has been SOB for past 3 days. Hx of recurrent pneuomina. Denies exposure to COVID.

## 2019-12-24 NOTE — ED Notes (Signed)
Pt intubated by Dr. Manus Gunning with 7.5 ETT measured at 24 at the lip. Positive color change. Equal breath sounds auscultated by Dr. Manus Gunning.

## 2019-12-24 NOTE — ED Notes (Signed)
Date and time results received: 12/24/19 4:39 PM  (use smartphrase ".now" to insert current time)  Test: PCO2 Critical Value: 107  Name of Provider Notified: Rancour, MD  Orders Received? Or Actions Taken?: na

## 2019-12-24 NOTE — Progress Notes (Addendum)
CRITICAL VALUE ALERT  Critical Value:  Lactic Acid 3.6  Date & Time Notied:  12/24/19, 2250  Provider Notified: Emokpae  Orders Received/Actions taken: NS bolus

## 2019-12-24 NOTE — Progress Notes (Signed)
CRITICAL VALUE ALERT  Critical Value:  Phosphorous 0.7  Date & Time Notied:  12/24/19, 2308  Provider Notified: Blount  Orders Received/Actions taken:

## 2019-12-24 NOTE — Progress Notes (Addendum)
p Pharmacy Antibiotic Note  Jeffrey [name] is a 59 y.o. male admitted on 12/24/2019 with pneumonia.  Pharmacy has been consulted for vancomycin dosing.  Plan: Vancomycin 1000mg  IV every 12 hours.  Goal trough 15-20 mcg/mL.  Height: 5\' 10"  (177.8 cm) Weight: 154 lb (69.9 kg) IBW/kg (Calculated) : 73  Temp (24hrs), Avg:97.7 F (36.5 C), Min:97.7 F (36.5 C), Max:97.7 F (36.5 C)  No results for input(s): WBC, CREATININE, LATICACIDVEN, VANCOTROUGH, VANCOPEAK, VANCORANDOM, GENTTROUGH, GENTPEAK, GENTRANDOM, TOBRATROUGH, TOBRAPEAK, TOBRARND, AMIKACINPEAK, AMIKACINTROU, AMIKACIN in the last 168 hours.  Estimated Creatinine Clearance: 98.3 mL/min (A) (by C-G formula based on SCr of 0.53 mg/dL (L)).    Allergies  Allergen Reactions  . Augmentin [Amoxicillin-Pot Clavulanate] Rash and Other (See Comments)    Has patient had a PCN reaction causing immediate rash, facial/tongue/throat swelling, SOB or lightheadedness with hypotension: No Has patient had a PCN reaction causing severe rash involving mucus membranes or skin necrosis: No Has patient had a PCN reaction that required hospitalization No Has patient had a PCN reaction occurring within the last 10 years: Yes If all of the above answers are "NO", then may proceed with Cephalosporin use.  . Ace Inhibitors Hives    Antimicrobials this admission: 1/31 vancomycin >>  1/31 cefepime x 1   Microbiology results: 1/31 MRSA 2/31 1/31 Covid: negative  1/31 BCx: sent   Thank you for allowing pharmacy to be a part of this patient's care.  2/31 Jeffrey [name] 12/24/2019 4:48 PM

## 2019-12-24 NOTE — ED Notes (Signed)
Patient being moved to Room one to prepare for intubation.

## 2019-12-24 NOTE — Progress Notes (Signed)
p Pharmacy Antibiotic Note  Jeffrey [name] is a 59 y.o. male admitted on 12/24/2019 with pneumonia.  Pharmacy has been consulted for vancomycin and zosyn dosing.  Plan: Vancomycin 1000mg  IV every 12 hours.  Goal trough 15-20 mcg/mL. Zosyn 3.375gm iv q8h   Height: 5\' 10"  (177.8 cm) Weight: 154 lb (69.9 kg) IBW/kg (Calculated) : 73  Temp (24hrs), Avg:98.1 F (36.7 C), Min:97.7 F (36.5 C), Max:98.4 F (36.9 C)  Recent Labs  Lab 12/24/19 1703 12/24/19 1830  WBC 6.7  --   CREATININE 0.50*  --   LATICACIDVEN 1.7 3.2*    Estimated Creatinine Clearance: 98.3 mL/min (A) (by C-G formula based on SCr of 0.5 mg/dL (L)).    Allergies  Allergen Reactions  . Augmentin [Amoxicillin-Pot Clavulanate] Rash and Other (See Comments)    Has patient had a PCN reaction causing immediate rash, facial/tongue/throat swelling, SOB or lightheadedness with hypotension: No Has patient had a PCN reaction causing severe rash involving mucus membranes or skin necrosis: No Has patient had a PCN reaction that required hospitalization No Has patient had a PCN reaction occurring within the last 10 years: Yes If all of the above answers are "NO", then may proceed with Cephalosporin use.  . Ace Inhibitors Hives    Antimicrobials this admission: 1/31 vancomycin >>  1/31 cefepime x 1  1/31 zosyn >>   Microbiology results: 1/31 MRSA 2/31 1/31 Covid: negative  1/31 BCx: sent   Thank you for allowing pharmacy to be a part of this patient's care.  2/31 Jeffrey [name] 12/24/2019 9:00 PM
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
## 2019-12-25 DIAGNOSIS — J441 Chronic obstructive pulmonary disease with (acute) exacerbation: Secondary | ICD-10-CM

## 2019-12-25 DIAGNOSIS — I10 Essential (primary) hypertension: Secondary | ICD-10-CM

## 2019-12-25 DIAGNOSIS — G9341 Metabolic encephalopathy: Secondary | ICD-10-CM

## 2019-12-25 DIAGNOSIS — F314 Bipolar disorder, current episode depressed, severe, without psychotic features: Secondary | ICD-10-CM

## 2019-12-25 DIAGNOSIS — J181 Lobar pneumonia, unspecified organism: Secondary | ICD-10-CM

## 2019-12-25 DIAGNOSIS — J9621 Acute and chronic respiratory failure with hypoxia: Secondary | ICD-10-CM

## 2019-12-25 DIAGNOSIS — J9622 Acute and chronic respiratory failure with hypercapnia: Secondary | ICD-10-CM

## 2019-12-25 LAB — AMMONIA: Ammonia: 30 umol/L (ref 9–35)

## 2019-12-25 LAB — BASIC METABOLIC PANEL
Anion gap: 7 (ref 5–15)
BUN: 12 mg/dL (ref 6–20)
CO2: 33 mmol/L — ABNORMAL HIGH (ref 22–32)
Calcium: 8.2 mg/dL — ABNORMAL LOW (ref 8.9–10.3)
Chloride: 97 mmol/L — ABNORMAL LOW (ref 98–111)
Creatinine, Ser: 0.67 mg/dL (ref 0.61–1.24)
GFR calc Af Amer: >60 mL/min (ref >60)
GFR calc non Af Amer: >60 mL/min (ref >60)
Glucose, Bld: 134 mg/dL — ABNORMAL HIGH (ref 70–99)
Potassium: 3.7 mmol/L (ref 3.5–5.1)
Sodium: 137 mmol/L (ref 135–145)

## 2019-12-25 LAB — PHOSPHORUS
Phosphorus: 2.1 mg/dL — ABNORMAL LOW (ref 2.5–4.6)
Phosphorus: 2.9 mg/dL (ref 2.5–4.6)
Phosphorus: 3.4 mg/dL (ref 2.5–4.6)

## 2019-12-25 LAB — URINALYSIS, COMPLETE (UACMP) WITH MICROSCOPIC
Bacteria, UA: NONE SEEN
Glucose, UA: NEGATIVE mg/dL
Hgb urine dipstick: NEGATIVE
Ketones, ur: NEGATIVE mg/dL
Leukocytes,Ua: NEGATIVE
Nitrite: NEGATIVE
Protein, ur: 30 mg/dL — AB
Specific Gravity, Urine: 1.042 — ABNORMAL HIGH (ref 1.005–1.030)
pH: 6 (ref 5.0–8.0)

## 2019-12-25 LAB — MRSA PCR SCREENING
MRSA by PCR: NEGATIVE
MRSA by PCR: NEGATIVE

## 2019-12-25 LAB — COMPREHENSIVE METABOLIC PANEL
ALT: 14 U/L (ref 0–44)
AST: 19 U/L (ref 15–41)
Albumin: 2.5 g/dL — ABNORMAL LOW (ref 3.5–5.0)
Alkaline Phosphatase: 58 U/L (ref 38–126)
Anion gap: 9 (ref 5–15)
BUN: 15 mg/dL (ref 6–20)
CO2: 34 mmol/L — ABNORMAL HIGH (ref 22–32)
Calcium: 8.2 mg/dL — ABNORMAL LOW (ref 8.9–10.3)
Chloride: 95 mmol/L — ABNORMAL LOW (ref 98–111)
Creatinine, Ser: 0.61 mg/dL (ref 0.61–1.24)
GFR calc Af Amer: >60 mL/min (ref >60)
GFR calc non Af Amer: >60 mL/min (ref >60)
Glucose, Bld: 139 mg/dL — ABNORMAL HIGH (ref 70–99)
Potassium: 3.3 mmol/L — ABNORMAL LOW (ref 3.5–5.1)
Sodium: 138 mmol/L (ref 135–145)
Total Bilirubin: 0.6 mg/dL (ref 0.3–1.2)
Total Protein: 5.2 g/dL — ABNORMAL LOW (ref 6.5–8.1)

## 2019-12-25 LAB — CBC
HCT: 31.6 % — ABNORMAL LOW (ref 39.0–52.0)
Hemoglobin: 9.7 g/dL — ABNORMAL LOW (ref 13.0–17.0)
MCH: 29.2 pg (ref 26.0–34.0)
MCHC: 30.7 g/dL (ref 30.0–36.0)
MCV: 95.2 fL (ref 80.0–100.0)
Platelets: 192 10*3/uL (ref 150–400)
RBC: 3.32 MIL/uL — ABNORMAL LOW (ref 4.22–5.81)
RDW: 12.5 % (ref 11.5–15.5)
WBC: 4.2 10*3/uL (ref 4.0–10.5)
nRBC: 0 % (ref 0.0–0.2)

## 2019-12-25 LAB — GLUCOSE, CAPILLARY
Glucose-Capillary: 132 mg/dL — ABNORMAL HIGH (ref 70–99)
Glucose-Capillary: 139 mg/dL — ABNORMAL HIGH (ref 70–99)
Glucose-Capillary: 119 mg/dL — ABNORMAL HIGH (ref 70–99)
Glucose-Capillary: 145 mg/dL — ABNORMAL HIGH (ref 70–99)
Glucose-Capillary: 138 mg/dL — ABNORMAL HIGH (ref 70–99)

## 2019-12-25 LAB — BLOOD GAS, ARTERIAL
Acid-Base Excess: 13.3 mmol/L — ABNORMAL HIGH (ref 0.0–2.0)
Bicarbonate: 36.9 mmol/L — ABNORMAL HIGH (ref 20.0–28.0)
FIO2: 40
O2 Saturation: 98.8 %
Patient temperature: 37
pCO2 arterial: 43.1 mmHg (ref 32.0–48.0)
pH, Arterial: 7.543 — ABNORMAL HIGH (ref 7.350–7.450)
pO2, Arterial: 107 mmHg (ref 83.0–108.0)

## 2019-12-25 LAB — MAGNESIUM
Magnesium: 2.1 mg/dL (ref 1.7–2.4)
Magnesium: 2.1 mg/dL (ref 1.7–2.4)
Magnesium: 2 mg/dL (ref 1.7–2.4)

## 2019-12-25 LAB — PROCALCITONIN: Procalcitonin: <0.1 ng/mL

## 2019-12-25 LAB — LACTIC ACID, PLASMA: Lactic Acid, Venous: 3.6 mmol/L (ref 0.5–1.9)

## 2019-12-25 LAB — HIV ANTIBODY (ROUTINE TESTING W REFLEX): HIV Screen 4th Generation wRfx: NONREACTIVE

## 2019-12-25 MED ORDER — VITAL HIGH PROTEIN PO LIQD
1000.0000 mL | ORAL | Status: DC
  Administered 2019-12-25: 13:00:00 1000 mL

## 2019-12-25 MED ORDER — PRO-STAT SUGAR FREE PO LIQD
30.0000 mL | Freq: Two times a day (BID) | ORAL | Status: DC
  Administered 2019-12-25: 30 mL
  Filled 2019-12-25: qty 30

## 2019-12-25 MED ORDER — GUAIFENESIN-DM 100-10 MG/5ML PO SYRP: 10.0000 mL | ORAL_SOLUTION | ORAL | Status: DC | PRN

## 2019-12-25 MED ORDER — ACETAMINOPHEN 325 MG PO TABS: 650.0000 mg | ORAL_TABLET | Freq: Four times a day (QID) | ORAL | Status: DC | PRN

## 2019-12-25 MED ORDER — FLUOXETINE HCL 20 MG PO CAPS
40.0000 mg | ORAL_CAPSULE | Freq: Every day | ORAL | Status: DC
  Administered 2019-12-26: 40 mg
  Filled 2019-12-25: qty 2

## 2019-12-25 MED ORDER — CHLORHEXIDINE GLUCONATE CLOTH 2 % EX PADS
6.0000 | MEDICATED_PAD | Freq: Every day | CUTANEOUS | Status: DC
  Administered 2019-12-25 – 2020-01-09 (×16): 6 via TOPICAL

## 2019-12-25 MED ORDER — OLANZAPINE 5 MG PO TABS
10.0000 mg | ORAL_TABLET | Freq: Every day | ORAL | Status: DC
  Administered 2019-12-25: 10 mg
  Filled 2019-12-25: qty 2

## 2019-12-25 MED ORDER — ALBUTEROL SULFATE (2.5 MG/3ML) 0.083% IN NEBU: 2.5000 mg | INHALATION_SOLUTION | RESPIRATORY_TRACT | Status: DC | PRN

## 2019-12-25 MED ORDER — ACETAMINOPHEN 650 MG RE SUPP: 650.0000 mg | Freq: Four times a day (QID) | RECTAL | Status: DC | PRN

## 2019-12-25 MED ORDER — ATORVASTATIN CALCIUM 20 MG PO TABS
20.0000 mg | ORAL_TABLET | Freq: Every evening | ORAL | Status: DC
  Administered 2019-12-25 – 2020-01-09 (×15): 20 mg via ORAL
  Filled 2019-12-25 (×15): qty 1

## 2019-12-25 MED ORDER — BUDESONIDE 0.5 MG/2ML IN SUSP
0.5000 mg | Freq: Two times a day (BID) | RESPIRATORY_TRACT | Status: DC
  Administered 2019-12-25 – 2020-01-10 (×30): 0.5 mg via RESPIRATORY_TRACT
  Filled 2019-12-25 (×31): qty 2

## 2019-12-25 MED ORDER — LACTATED RINGERS IV SOLN: INTRAVENOUS | Status: DC

## 2019-12-25 MED ORDER — IPRATROPIUM-ALBUTEROL 0.5-2.5 (3) MG/3ML IN SOLN
3.0000 mL | Freq: Four times a day (QID) | RESPIRATORY_TRACT | Status: DC
  Administered 2019-12-25 – 2019-12-26 (×8): 3 mL via RESPIRATORY_TRACT
  Filled 2019-12-25 (×8): qty 3

## 2019-12-25 MED ORDER — PANTOPRAZOLE SODIUM 40 MG IV SOLR
40.0000 mg | INTRAVENOUS | Status: DC
  Administered 2019-12-25: 40 mg via INTRAVENOUS
  Filled 2019-12-25: qty 40

## 2019-12-25 MED ORDER — VALPROIC ACID 250 MG/5ML PO SOLN
500.0000 mg | Freq: Three times a day (TID) | ORAL | Status: DC
  Administered 2019-12-26 (×2): 500 mg
  Filled 2019-12-25 (×5): qty 10

## 2019-12-25 MED ORDER — VALPROIC ACID 250 MG/5ML PO SOLN: 1500.0000 mg | Freq: Every day | ORAL | Status: DC

## 2019-12-25 MED ORDER — ASPIRIN EC 81 MG PO TBEC
81.0000 mg | DELAYED_RELEASE_TABLET | Freq: Every evening | ORAL | Status: DC
  Administered 2019-12-25 – 2020-01-09 (×15): 81 mg via ORAL
  Filled 2019-12-25 (×15): qty 1

## 2019-12-25 MED ORDER — FUROSEMIDE 10 MG/ML IJ SOLN
40.0000 mg | Freq: Once | INTRAMUSCULAR | Status: AC
  Administered 2019-12-25: 12:00:00 40 mg via INTRAVENOUS
  Filled 2019-12-25: qty 4

## 2019-12-25 MED ORDER — PANTOPRAZOLE SODIUM 40 MG PO PACK
40.0000 mg | PACK | ORAL | Status: DC
  Administered 2019-12-25 – 2019-12-26 (×2): 40 mg
  Filled 2019-12-25 (×2): qty 20

## 2019-12-25 MED ORDER — VITAL HIGH PROTEIN PO LIQD
1000.0000 mL | ORAL | Status: DC
  Administered 2019-12-26: 1000 mL

## 2019-12-25 NOTE — Progress Notes (Signed)
Patient on vent for respiratory failure. X-ray shows long lungs ,with wide space ribs , depressed diaphragm. ABG indicates elevated PCO2 chronic with elevated bicarb. Wheezes which resolved mostly with duo neb. Lots of clear secretions with suctioned. Suspect underlying asthma non diagnosed component along with long history of smoking plus mariajuana use. On 3-4 liter oxygen chronic at home. Vent VT adjusted to 8 cc kg. from 600 to 580 cc

## 2019-12-25 NOTE — [facility] (Signed)
NAME:  Jeffrey [name], MRN:  [id number], DOB:  [birth year], LOS: 1 ADMISSION DATE:  12/24/2019, CONSULTATION DATE:  12/25/2019 REFERRING MD:  Dr. Arbutus Leas, [facility], CHIEF COMPLAINT:  Short of breath   Brief History   59 yo male smoker brought to ER with altered mental status, tremor and hypoxia.  Has hx of COPD and wears 3 to 4 liters oxygen at home  Reported dyspnea for 3 days prior to admission and had recent treatment for pneumonia.  Found to have hypoxia and hypercapnia.  Started on treated for COPD exacerbation and HCAP.  Failed to improve with Bipap and required intubation.  Past Medical History  COPD, Bipolar, Diastolic CHF, Hepatic encephalopathy, Depression, Polysubstance abuse, [name], ETOH, GI bleed  Significant Hospital Events   1/31 Admit  Consults:    Procedures:  ETT 2/01 >>  Significant Diagnostic Tests:  PFT 10/09/16 >> FEV1 1.84 (49%), FEV1% 48, TLC 7.39 (105%), DLCO 36% A1AT 10/09/16 >> 215, MM Spirometry 02/02/17 >> FEV1 2.00 (55%), FEV1% 54 CT head 12/24/19 >> normal  Micro Data:  SARS CoV2 PCR 1/31 >> negative Influenza PCR 1/31 >> negative Blood 1/31 >>  Sputum 2/01 >>  Antimicrobials:  Zosyn 1/31 >>   Interim history/subjective:    Objective   Blood pressure (!) 145/73, pulse (!) 106, temperature 98.6 F (37 C), resp. rate 16, height 5\' 10"  (1.778 m), weight 69.9 kg, SpO2 95 %.    Vent Mode: PRVC FiO2 (%):  [40 %-60 %] 40 % Set Rate:  [18 bmp-24 bmp] 18 bmp Vt Set:  [580 mL-600 mL] 580 mL PEEP:  [5 cmH20] 5 cmH20 Plateau Pressure:  [17 cmH20-26 cmH20] 18 cmH20   Intake/Output Summary (Last 24 hours) at 12/25/2019 1136 Last data filed at 12/25/2019 02/22/2020 Gross per 24 hour  Intake 2515.53 ml  Output 600 ml  Net 1915.53 ml   Filed Weights   12/24/19 1506  Weight: 69.9 kg    Examination:  General - sedated Eyes - pupils reactive ENT - ETT in place Cardiac - regular rate/rhythm, no murmur Chest - prolonged exhalation, no wheeze Abdomen - soft, non  tender, + bowel sounds Extremities - 1+ edema Skin - no rashes Neuro - RASS -2  CXR (reviewed by me) - basilar ASD   Resolved Hospital Problem list     Assessment & Plan:   Acute on chronic hypoxic/hypercapnic respiratory failure from AECOPD and aspiration pneumonia. 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 - full vent support - allow for permissive hypercapnia to avoid PEEPi - continue solumedrol - scheduled BDs - day 2 of ABx  Acute metabolic encephalopathy 2nd to hypoxia/hypercapnia. Hx of bipolar with depression. - RASS goal 0 to -1  Hypokalemia. Hypophosphatemia. - f/u labs  Anemia of critical illness and chronic disease. - f/u CBC - transfuse for Hb < 7 or significant bleeding  Chronic diastolic CHF, [name]. - ASA, lipitor - hold outpt lopressor - lasix 40 mg IV x one on 2/01  Best practice:  Diet: tube feeds DVT prophylaxis: lovenox GI prophylaxis: protonix Mobility: bed rest Code Status: full code Disposition: ICU  Labs   CBC: Recent Labs  Lab 12/24/19 1703 12/24/19 2202 12/25/19 0209  WBC 6.7 4.2 4.2  NEUTROABS 6.4  --   --   HGB 11.7* 11.1* 9.7*  HCT 39.6 37.2* 31.6*  MCV 96.4 96.9 95.2  PLT 226 208 192    Basic Metabolic Panel: Recent Labs  Lab 12/24/19 1703 12/24/19 1830 12/24/19  2202 12/25/19 0209 12/25/19 0841  NA 142  --   --  137 138  K 4.5  --   --  3.7 3.3*  CL 86*  --   --  97* 95*  CO2 42*  --   --  33* 34*  GLUCOSE 121*  --   --  134* 139*  BUN 10  --   --  12 15  CREATININE 0.50*  --   --  0.67 0.61  CALCIUM 9.4  --   --  8.2* 8.2*  MG  --  2.5* 2.4  --  2.1  PHOS 2.5  --  0.7*  --  2.1*   GFR: Estimated Creatinine Clearance: 98.3 mL/min (by C-G formula based on SCr of 0.61 mg/dL). Recent Labs  Lab 12/24/19 1703 12/24/19 1830 12/24/19 2202 12/25/19 0209 12/25/19 0841  PROCALCITON  --   --   --   --  <0.10  WBC 6.7  --  4.2 4.2  --   LATICACIDVEN 1.7 3.2* 3.6* 3.6*  --     Liver  Function Tests: Recent Labs  Lab 12/24/19 1703 12/25/19 0841  AST 26 19  ALT 19 14  ALKPHOS 82 58  BILITOT 0.6 0.6  PROT 7.1 5.2*  ALBUMIN 3.4* 2.5*   No results for input(s): LIPASE, AMYLASE in the last 168 hours. Recent Labs  Lab 12/24/19 1703 12/25/19 0841  AMMONIA 56* 30    ABG    Component Value Date/Time   PHART 7.543 (H) 12/25/2019 0428   PCO2ART 43.1 12/25/2019 0428   PO2ART 107 12/25/2019 0428   HCO3 36.9 (H) 12/25/2019 0428   TCO2 34 (H) 12/21/2018 0357   ACIDBASEDEF 0.6 02/17/2019 1850   O2SAT 98.8 12/25/2019 0428     Coagulation Profile: No results for input(s): INR, PROTIME in the last 168 hours.  Cardiac Enzymes: No results for input(s): CKTOTAL, CKMB, CKMBINDEX, TROPONINI in the last 168 hours.  HbA1C: No results found for: HGBA1C  CBG: Recent Labs  Lab 12/24/19 2340 12/25/19 0340 12/25/19 0738 12/25/19 1100  GLUCAP 144* 132* 139* 119*    Review of Systems:   Unable to obtain.  Past Medical History  He,  has a past medical history of Acute blood loss anemia (02/07/2017), Anxiety, Arthritis, Bipolar disorder (HCC), Bipolar disorder (HCC), CHF (congestive heart failure) (HCC), COPD, severe (HCC) (10/09/2016), Depression, Hyperlipidemia, Hypertension, Peptic ulcer disease, Pneumonia, [name] (peripheral vascular disease) (HCC) (06/18/2015), and Shortness of breath.   Surgical History    Past Surgical History:  Procedure Laterality Date  . BIOPSY  02/09/2017   Procedure: BIOPSY;  Surgeon: [name], MD;  Location: [facility];  Service: Endoscopy;;  duodenal gastric  . COLONOSCOPY  03/14/2007   FWY:OVZCHY colonoscopy and terminal ileoscopy except external hemorrhoids  . COLONOSCOPY N/A 05/05/2013   [name]: external/internal anal canal hemorrhoids, unable to intubate TI, segemental biopsies unremarkable   . COLONOSCOPY N/A 04/11/2017   Procedure: COLONOSCOPY;  Surgeon: [name], MD;  Location: [facility];  Service: Endoscopy;   Laterality: N/A;  . COLONOSCOPY WITH PROPOFOL N/A 03/31/2017   Procedure: COLONOSCOPY WITH PROPOFOL;  Surgeon: [name], MD;  Location: [facility];  Service: Endoscopy;  Laterality: N/A;  1:45pm  . ESOPHAGOGASTRODUODENOSCOPY  03/14/2007   IFO:YDXAJOINOM antral gastritis with bulbar duodenitis/paucity to postbulbar duodenal folds and biopsy were benign with no evidence of villous atrophy.  . ESOPHAGOGASTRODUODENOSCOPY (EGD) WITH PROPOFOL N/A 02/09/2017   Procedure: ESOPHAGOGASTRODUODENOSCOPY (EGD) WITH PROPOFOL;  Surgeon: Danie Binder, MD;  Location: [facility];  Service: Endoscopy;  Laterality: N/A;  . GIVENS CAPSULE STUDY N/A 04/08/2017   Procedure: GIVENS CAPSULE STUDY;  Surgeon: Daneil Dolin, MD;  Location: [facility];  Service: Endoscopy;  Laterality: N/A;  . HAND SURGERY     left, secondary to self-inflicted laceration  . HEMORRHOID SURGERY N/A 04/14/2017   Procedure: HEMORRHOIDECTOMY;  Surgeon: Aviva Signs, MD;  Location: [facility];  Service: General;  Laterality: N/A;  . SHOULDER SURGERY     right  . TOE SURGERY     left great toe , amputated-lawnmover accident     Social History   reports that he has been smoking cigarettes. He started smoking about 48 years ago. He has a 40.00 pack-year smoking history. He has never used smokeless tobacco. He reports previous drug use. Drug: Marijuana. He reports that he does not drink alcohol.   Family History   His family history includes Anxiety disorder in his daughter and son; Asthma in his brother; Breast cancer in his mother; Depression in his daughter and son; Emphysema in his maternal grandfather; Heart attack in his maternal aunt, maternal grandfather, maternal grandmother, maternal uncle, paternal aunt, paternal grandfather, paternal grandmother, and paternal uncle; Heart disease in his father. There is no history of Colon cancer or Liver disease.   Allergies Allergies  Allergen Reactions  . Augmentin [Amoxicillin-Pot  Clavulanate] Rash and Other (See Comments)    Has patient had a PCN reaction causing immediate rash, facial/tongue/throat swelling, SOB or lightheadedness with hypotension: No Has patient had a PCN reaction causing severe rash involving mucus membranes or skin necrosis: No Has patient had a PCN reaction that required hospitalization No Has patient had a PCN reaction occurring within the last 10 years: Yes If all of the above answers are "NO", then may proceed with Cephalosporin use.  Humberto Leep Inhibitors Hives     Home Medications  Prior to Admission medications   Medication Sig Start Date End Date Taking? Authorizing Provider  albuterol (PROVENTIL) (2.5 MG/3ML) 0.083% nebulizer solution Take 3 mLs (2.5 mg total) by nebulization every 4 (four) hours as needed for shortness of breath. 01/13/19  Yes Mikhail, Velta Addison, DO  albuterol (VENTOLIN HFA) 108 (90 Base) MCG/ACT inhaler Inhale 2 puffs into the lungs every 4 (four) hours as needed for wheezing or shortness of breath. 12/04/19  Yes Noemi Chapel, MD  aspirin EC 81 MG tablet Take 81 mg by mouth every evening.    Yes [provider]  atorvastatin (LIPITOR) 20 MG tablet Take 1 tablet (20 mg total) by mouth daily. For high cholesterol Patient taking differently: Take 20 mg by mouth every evening. For high cholesterol 02/09/18  Yes Money, Lowry Ram, FNP  divalproex (DEPAKOTE ER) 500 MG 24 hr tablet Take 3 tablets (1,500 mg total) by mouth at bedtime. 11/14/19  Yes Cloria Spring, MD  doxycycline (VIBRA-TABS) 100 MG tablet Take 100 mg by mouth 2 (two) times daily. 12/22/19  Yes [provider]  FLUoxetine (PROZAC) 40 MG capsule Take 1 capsule (40 mg total) by mouth daily. For mood control Patient taking differently: Take 40 mg by mouth every morning. For mood control 11/14/19  Yes Cloria Spring, MD  folic acid (FOLVITE) 1 MG tablet Take 1 mg by mouth every morning.  11/29/19  Yes [provider]  gabapentin (NEURONTIN) 400 MG  capsule Take 1 capsule (400 mg total) by mouth 3 (three) times daily. 11/14/19  Yes Harrington Challenger,  Mellody Drown, MD  guaiFENesin (MUCINEX) 600 MG 12 hr tablet Take 600 mg by mouth 2 (two) times daily as needed for cough or to loosen phlegm.    Yes [provider]  ipratropium (ATROVENT) 0.02 % nebulizer solution Take 2.5 mLs (0.5 mg total) by nebulization 3 (three) times daily. 01/13/19  Yes Mikhail, Kieler, DO  metoprolol tartrate (LOPRESSOR) 25 MG tablet Take 1 tablet (25 mg total) by mouth 2 (two) times daily. Patient taking differently: Take 25 mg by mouth every morning.  03/07/19  Yes Rhetta Mura, MD  Multiple Vitamin (MULTIVITAMIN WITH MINERALS) TABS tablet Take 1 tablet by mouth daily. Patient taking differently: Take 1 tablet by mouth every morning.  01/13/19  Yes Mikhail, [place], DO  Nutritional Supplements (BOOST NUTRITIONAL ENERGY PO) Take 237 mLs by mouth 2 (two) times daily.   Yes [provider]  OLANZapine (ZYPREXA) 10 MG tablet Take 1.5 tablets (15 mg total) by mouth at bedtime. For mood stability Patient taking differently: Take 10 mg by mouth at bedtime. For mood stability 11/14/19  Yes Myrlene Broker, MD  pantoprazole (PROTONIX) 20 MG tablet Take 20 mg by mouth every morning.  11/29/19  Yes [provider]  potassium chloride SA (KLOR-CON) 20 MEQ tablet Take 20 mEq by mouth every morning.  07/17/19  Yes [provider]  traZODone (DESYREL) 50 MG tablet Take 2 tablets (100 mg total) by mouth at bedtime. 11/14/19  Yes Myrlene Broker, MD  vitamin B-12 (CYANOCOBALAMIN) 500 MCG tablet Take 1,000 mcg by mouth every morning.    Yes [provider]  doxycycline (VIBRAMYCIN) 100 MG capsule Take 1 capsule (100 mg total) by mouth 2 (two) times daily. Patient not taking: Reported on 12/24/2019 12/07/19   Gilda Crease, MD  feeding supplement, ENSURE ENLIVE, (ENSURE ENLIVE) LIQD Take 237 mLs by mouth 2 (two) times daily between meals. Patient not  taking: Reported on 12/24/2019 03/30/19   Maurilio Lovely D, DO  furosemide (LASIX) 20 MG tablet Take 2 tablets (40 mg total) by mouth daily for 30 days. 05/02/19 12/04/19  Sherryll Burger, Pratik D, DO  predniSONE (DELTASONE) 20 MG tablet 3 tabs po daily x 3 days, then 2 tabs x 3 days, then 1.5 tabs x 3 days, then 1 tab x 3 days, then 0.5 tabs x 3 days Patient not taking: Reported on 12/24/2019 12/07/19   Gilda Crease, MD     Critical care time: 34 minutes    Coralyn Helling, MD [facility] Pulmonary/Critical Care 12/25/2019, 12:03 PM

## 2019-12-25 NOTE — Progress Notes (Signed)
PROGRESS NOTE  Jeffrey [name] BPZ:[id number] DOB: [birth year] DOA: 12/24/2019 PCP: Lemmie Evens, MD  Brief History:  59 year old male with a history of COPD, chronic respiratory failure on 3 to 4 L nasal cannula, diastolic CHF, bipolar disorder, GI bleed, alcohol abuse, hypertension, hyperlipidemia presenting with 3-day history of shortness of breath and confusion.  Upon EMS arrival, the patient was noted to have oxygen saturation in the 80s.  He was given Solu-Medrol in route to the hospital.  History was limited in the emergency department secondary to the patient's confusion and extremis and respiratory distress.  The patient was initially placed on BiPAP and given Ativan.  He continued to be confused and interfere with medical therapy.  As result, the patient was subsequently intubated in the emergency department.  The patient's wife stated that the patient had had some passive suicidal ideations stating that he wished someone would just shoot him.  In addition, the patient had stopped using his home oxygen and other medical therapies.  Notably, the patient was just admitted to the hospital from 10/12/2019 to 10/16/2019 secondary to acute on chronic respiratory failure resulting from COPD exacerbation and acute on chronic diastolic CHF.  The patient was discharged home with furosemide 40 mg daily.  His discharge weight was 150 pounds.  He had a telemedicine visit with Dr. Bronson Ing on 11/29/2019, and he was instructed to continue taking his furosemide.  In the emergency department, the patient had a temperature 100.8 F.  He was initially hemodynamically stable, but blood pressures became soft after intubation.  WBC 4.2, hemoglobin 9.7, platelets 192,000.  BMP was essentially unremarkable.  Chest x-ray showed bibasilar infiltrates, left greater than right.  There was chronic interstitial prominence.  The patient was started on vancomycin and Zosyn.  CT of the brain was negative for any  acute findings.  Assessment/Plan: Acute on chronic respiratory failure with hypoxia and hypercarbia -Secondary to COPD exacerbation, pneumonia -Currently intubated and sedated -PCCM consult -Wean to extubation -Start enteral feeding -SARS-CoV2 RT-PCR neg  HCAP/lobar pneumonia -Check procalcitonin -Tracheal aspirate for culture -MRSA screen -Continue vancomycin and Zosyn  COPD exacerbation -Continue duo nebs -Start Pulmicort -Continue IV Solu-Medrol  chronic diastolic CHF -Saline lock IV fluids -10/12/2019 echo EF 65-70%, grade 1 DD, trivial TR, mild MR -Personally reviewed EKG--sinus rhythm, nonspecific T wave change -Daily weights  Acute metabolic encephalopathy -Multifactorial including hypercarbia, infectious process, hyperammonemia  Essential Hypertension -holding metoprolol due to soft BP  Bipolar disorder -Restart Depakote, olanzapine, fluoxetine -psychiatry eval once pt is stable      Disposition Plan:   Remain in ICU Family Communication:  No Family at bedside  Consultants:  PCCM  Code Status:  FULL   DVT Prophylaxis:  [place] Lovenox   Procedures: As Listed in Progress Note Above  Antibiotics: vanco 1/31>>> Zosyn 1/31>>>    Subjective: Pt intubated and sedated.  No events overnight.  No vomiting, diarrhea, respiratory distress or uncontrolled pain.  Objective: Vitals:   12/25/19 0600 12/25/19 0615 12/25/19 0630 12/25/19 0700  BP: (!) 107/57 108/63 (!) 104/56 116/63  Pulse: 73 67 68 79  Resp: (!) 0 10 (!) 0 19  Temp: 98.6 F (37 C) 98.4 F (36.9 C) 98.3 F (36.8 C) 98.2 F (36.8 C)  TempSrc:      SpO2: 99% 99% 99% 99%  Weight:      Height:        Intake/Output Summary (Last 24 hours) at 12/25/2019 0757  Last data filed at 12/25/2019 9798 Gross per 24 hour  Intake 2515.53 ml  Output 600 ml  Net 1915.53 ml   Weight change:  Exam:   General:  Pt is alert, follows commands appropriately, not in acute distress  HEENT: No icterus,  No thrush, No neck mass, [place]/AT  Cardiovascular: RRR, S1/S2, no rubs, no gallops  Respiratory: bilateral exp wheeze.  Bibasilar rales.   Abdomen: Soft/+BS, non tender, non distended, no guarding  Extremities: trace LE edema, No lymphangitis, No petechiae, No rashes, no synovitis   Data Reviewed: I have personally reviewed following labs and imaging studies Basic Metabolic Panel: Recent Labs  Lab 12/24/19 1703 12/24/19 1830 12/24/19 2202 12/25/19 0209  NA 142  --   --  137  K 4.5  --   --  3.7  CL 86*  --   --  97*  CO2 42*  --   --  33*  GLUCOSE 121*  --   --  134*  BUN 10  --   --  12  CREATININE 0.50*  --   --  0.67  CALCIUM 9.4  --   --  8.2*  MG  --  2.5* 2.4  --   PHOS 2.5  --  0.7*  --    Liver Function Tests: Recent Labs  Lab 12/24/19 1703  AST 26  ALT 19  ALKPHOS 82  BILITOT 0.6  PROT 7.1  ALBUMIN 3.4*   No results for input(s): LIPASE, AMYLASE in the last 168 hours. Recent Labs  Lab 12/24/19 1703  AMMONIA 56*   Coagulation Profile: No results for input(s): INR, PROTIME in the last 168 hours. CBC: Recent Labs  Lab 12/24/19 1703 12/24/19 2202 12/25/19 0209  WBC 6.7 4.2 4.2  NEUTROABS 6.4  --   --   HGB 11.7* 11.1* 9.7*  HCT 39.6 37.2* 31.6*  MCV 96.4 96.9 95.2  PLT 226 208 192   Cardiac Enzymes: No results for input(s): CKTOTAL, CKMB, CKMBINDEX, TROPONINI in the last 168 hours. BNP: Invalid input(s): POCBNP CBG: Recent Labs  Lab 12/24/19 2340 12/25/19 0340 12/25/19 0738  GLUCAP 144* 132* 139*   HbA1C: No results for input(s): HGBA1C in the last 72 hours. Urine analysis:    Component Value Date/Time   COLORURINE YELLOW 11/21/2019 1815   APPEARANCEUR HAZY (A) 11/21/2019 1815   LABSPEC 1.016 11/21/2019 1815   PHURINE 8.0 11/21/2019 1815   GLUCOSEU NEGATIVE 11/21/2019 1815   HGBUR NEGATIVE 11/21/2019 1815   BILIRUBINUR NEGATIVE 11/21/2019 1815   KETONESUR NEGATIVE 11/21/2019 1815   PROTEINUR NEGATIVE 11/21/2019 1815    UROBILINOGEN 0.2 03/01/2011 1427   NITRITE NEGATIVE 11/21/2019 1815   LEUKOCYTESUR NEGATIVE 11/21/2019 1815   Sepsis Labs: @LABRCNTIP (procalcitonin:4,lacticidven:4) ) Recent Results (from the past 240 hour(s))  Respiratory Panel by RT PCR (Flu A&B, Covid) - Nasopharyngeal Swab     Status: None   Collection Time: 12/24/19  3:38 PM   Specimen: Nasopharyngeal Swab  Result Value Ref Range Status   SARS Coronavirus 2 by RT PCR NEGATIVE NEGATIVE Final    Comment: (NOTE) SARS-CoV-2 target nucleic acids are NOT DETECTED. The SARS-CoV-2 RNA is generally detectable in upper respiratoy specimens during the acute phase of infection. The lowest concentration of SARS-CoV-2 viral copies this assay can detect is 131 copies/mL. A negative result does not preclude SARS-Cov-2 infection and should not be used as the sole basis for treatment or other patient management decisions. A negative result may occur with  improper specimen collection/handling,  submission of specimen other than nasopharyngeal swab, presence of viral mutation(s) within the areas targeted by this assay, and inadequate number of viral copies (<131 copies/mL). A negative result must be combined with clinical observations, patient history, and epidemiological information. The expected result is Negative. Fact Sheet for Patients:  PinkCheek.be Fact Sheet for Healthcare Providers:  GravelBags.it This test is not yet ap proved or cleared by the [place] FDA and  has been authorized for detection and/or diagnosis of SARS-CoV-2 by FDA under an Emergency Use Authorization (EUA). This EUA will remain  in effect (meaning this test can be used) for the duration of the COVID-19 declaration under Section 564(b)(1) of the Act, 21 U.S.C. section 360bbb-3(b)(1), unless the authorization is terminated or revoked sooner.    Influenza A by PCR NEGATIVE NEGATIVE Final   Influenza B by  PCR NEGATIVE NEGATIVE Final    Comment: (NOTE) The Xpert Xpress SARS-CoV-2/FLU/RSV assay is intended as an aid in  the diagnosis of influenza from Nasopharyngeal swab specimens and  should not be used as a sole basis for treatment. Nasal washings and  aspirates are unacceptable for Xpert Xpress SARS-CoV-2/FLU/RSV  testing. Fact Sheet for Patients: PinkCheek.be Fact Sheet for Healthcare Providers: GravelBags.it This test is not yet approved or cleared by the [place] FDA and  has been authorized for detection and/or diagnosis of SARS-CoV-2 by  FDA under an Emergency Use Authorization (EUA). This EUA will remain  in effect (meaning this test can be used) for the duration of the  Covid-19 declaration under Section 564(b)(1) of the Act, 21  U.S.C. section 360bbb-3(b)(1), unless the authorization is  terminated or revoked. Performed at [facility], [address], [place], [place] [address]   Blood culture (routine x 2)     Status: None (Preliminary result)   Collection Time: 12/24/19  5:04 PM   Specimen: BLOOD LEFT HAND  Result Value Ref Range Status   Specimen Description BLOOD LEFT HAND  Final   Special Requests   Final    BOTTLES DRAWN AEROBIC AND ANAEROBIC Blood Culture adequate volume   Culture   Final    NO GROWTH < 24 HOURS Performed at [facility], [address], [place], [place] [address]    Report Status PENDING  Incomplete  Blood culture (routine x 2)     Status: None (Preliminary result)   Collection Time: 12/24/19  5:05 PM   Specimen: BLOOD LEFT HAND  Result Value Ref Range Status   Specimen Description BLOOD LEFT HAND  Final   Special Requests   Final    BOTTLES DRAWN AEROBIC AND ANAEROBIC Blood Culture adequate volume   Culture   Final    NO GROWTH < 24 HOURS Performed at [facility], [address] [place], [place] [address]    Report Status PENDING  Incomplete  MRSA PCR Screening     Status: None    Collection Time: 12/24/19  8:59 PM   Specimen: Nasopharyngeal  Result Value Ref Range Status   MRSA by PCR NEGATIVE NEGATIVE Final    Comment:        The GeneXpert MRSA Assay (FDA approved for NASAL specimens only), is one component of a comprehensive MRSA colonization surveillance program. It is not intended to diagnose MRSA infection nor to guide or monitor treatment for MRSA infections. Performed at [facility], [address], [place], [name] [address]      Scheduled Meds: . chlorhexidine gluconate (MEDLINE KIT)  15 mL Mouth Rinse BID  .  Chlorhexidine Gluconate Cloth  6 each Topical Daily  . divalproex  1,500 mg Oral QHS  . enoxaparin (LOVENOX) injection  40 mg Subcutaneous Q24H  . FLUoxetine  40 mg Oral Daily  . folic acid  1 mg Per Tube Daily  . guaiFENesin-dextromethorphan  10 mL Per Tube Q8H  . ipratropium-albuterol  3 mL Nebulization Q6H  . mouth rinse  15 mL Mouth Rinse 10 times per day  . methylPREDNISolone (SOLU-MEDROL) injection  60 mg Intravenous Q12H  . multivitamin  15 mL Per Tube Daily  . multivitamin with minerals  1 tablet Oral Daily  . OLANZapine  10 mg Oral QHS  . thiamine  100 mg Per Tube Daily   Or  . thiamine  100 mg Intravenous Daily   Continuous Infusions: . dextrose 5 % and 0.45% NaCl Stopped (12/25/19 8366)  . famotidine (PEPCID) IV Stopped (12/24/19 2239)  . piperacillin-tazobactam (ZOSYN)  IV 3.375 g (12/25/19 0610)  . propofol (DIPRIVAN) infusion 45 mcg/kg/min (12/25/19 0610)  . sodium phosphate  Dextrose 5% IVPB    . vancomycin 200 mL/hr at 12/25/19 2947    Procedures/Studies: CT Head Wo Contrast  Result Date: 12/24/2019 CLINICAL DATA:  Altered mental status EXAM: CT HEAD WITHOUT CONTRAST TECHNIQUE: Contiguous axial images were obtained from the base of the skull through the vertex without intravenous contrast. COMPARISON:  11/21/2019 FINDINGS: Brain: No evidence of acute infarction, hemorrhage, hydrocephalus, extra-axial collection  or mass lesion/mass effect. Vascular: No hyperdense vessel or unexpected calcification. Skull: Normal. Negative for fracture or focal lesion. Sinuses/Orbits: No acute finding. Other: None. IMPRESSION: Normal head CT for age. Electronically Signed   By: Inez Catalina M.D.   On: 12/24/2019 20:57   DG Chest 1V REPEAT Same Day  Result Date: 12/24/2019 CLINICAL DATA:  Post intubation. EXAM: CHEST - 1 VIEW SAME DAY COMPARISON:  Multiple chest x-rays since February 26, 2019 FINDINGS: The distal tip of the ET tube is difficult to see with confidence but appears to terminate 8.3 cm above the carina and 5 cm below the thoracic inlet, in adequate position. The NG tube terminates below today's film. No pneumothorax. Coarsened lung markings are seen bilaterally. The focal opacity in the right base seen earlier today is no longer visualized. No focal infiltrate seen in the left base either. There is thickening of the left pleural stripe near the base which is new since April of 2020. The cardiomediastinal silhouette is stable. IMPRESSION: 1. No focal infiltrates. 2. Thickening of the left inferior pleural stripe is consistent with pleural fluid, suggestive of loculation. 3. Coarsened lung markings are similar since December 04, 2019 and consistent with the patient's underlying emphysema. A superimposed atypical infection or pulmonary venous congestion are not excluded. Electronically Signed   By: Dorise Bullion III M.D   On: 12/24/2019 17:39   DG CHEST PORT 1 VIEW  Result Date: 12/24/2019 CLINICAL DATA:  Check gastric catheter placement EXAM: PORTABLE CHEST 1 VIEW COMPARISON:  12/24/2019 FINDINGS: Cardiac shadow is within normal limits. Endotracheal tube and gastric catheter are again noted and in satisfactory position. The lungs are again well aerated. Small loculated pleural effusion on the left inferiorly is noted. Patchy interstitial changes are noted similar to that seen on the prior exam. No bony abnormality is seen.  IMPRESSION: Endotracheal tube and gastric catheter in satisfactory position. Stable interstitial markings and left-sided pleural effusion. Electronically Signed   By: Inez Catalina M.D.   On: 12/24/2019 21:55   DG Chest Portable 1 View  Result Date: 12/24/2019 CLINICAL DATA:  Short of breath for 3 days. COVID-19 negative. CHF. COPD. Recurrent pneumonia. EXAM: PORTABLE CHEST 1 VIEW COMPARISON:  12/07/2019.  Chest CT of 03/26/2019 FINDINGS: Midline trachea. Normal heart size. No pneumothorax. Small left pleural effusion with possible mild loculation laterally. Moderate diffuse interstitial prominence. Left worse than right base airspace disease. IMPRESSION: Development of left greater than right base airspace disease, superimposed upon chronic moderate interstitial thickening. Favor infection or aspiration. Small left pleural effusion, with possible loculation laterally. Electronically Signed   By: Abigail Miyamoto M.D.   On: 12/24/2019 16:20   DG Chest Portable 1 View  Result Date: 12/07/2019 CLINICAL DATA:  59 year old male with shortness of breath for 3 days. Previously negative for COVID-19, not being tested at this time. EXAM: PORTABLE CHEST 1 VIEW COMPARISON:  Portable chest 12/04/2019 and earlier. FINDINGS: Portable AP upright view at 0220 hours. Lung volumes are stable. Mediastinal contours remain normal. Visualized tracheal air column is within normal limits. Coarse bilateral increased pulmonary interstitial opacity remains stable since last month. No pneumothorax, pleural effusion or new pulmonary opacity. No acute osseous abnormality identified. Negative visible bowel gas pattern. IMPRESSION: Stable portable chest with suspected chronic lung disease and no definite cardiopulmonary abnormality. Electronically Signed   By: Genevie Ann M.D.   On: 12/07/2019 02:41   DG Chest Port 1 View  Result Date: 12/04/2019 CLINICAL DATA:  Shortness of breath EXAM: PORTABLE CHEST 1 VIEW COMPARISON:  November 21, 2019 and  October 15, 2019 chest radiograph; chest CT Mar 26, 2019 FINDINGS: There is a degree of hyperexpansion, stable. There is underlying fibrotic type change without frank edema or consolidation apparent. No new opacity appreciable. Heart size is normal. Pulmonary vascularity is stable and within normal limits. No adenopathy. No bone lesions. IMPRESSION: Findings indicative of underlying COPD with scattered fibrosis throughout the lungs. No frank edema or focal airspace opacity. Cardiac silhouette within normal limits and stable. No adenopathy evident. Electronically Signed   By: Lowella Grip III M.D.   On: 12/04/2019 10:08    Orson Eva, DO  [facility] Pager [phone]  If 7PM-7AM, please contact night-coverage [email or web address] Password [facility] 12/25/2019, 7:57 AM   LOS: 1 day

## 2019-12-25 NOTE — Progress Notes (Signed)
Medication ordered for phosphorus replacement unavailable during night shift per [facility]. Attempted contact with pharmacy multiple times for substitution recommendation, no answer. Midlevel aware that medication is not available here and cannot be given.

## 2019-12-25 NOTE — Progress Notes (Addendum)
Initial Nutrition Assessment  DOCUMENTATION CODES:      INTERVENTION:   Tube feeding recommendation: Vital HP @ 40 ml/hr via OGT and increase by 10 ml every 6 hours to goal rate of 70 ml/hr.   Tube feeding regimen provides 1680 kcal, 146 grams of protein, and 1404 ml of H2O.     NUTRITION DIAGNOSIS:   Inadequate oral intake related to inability to eat as evidenced by NPO status.   GOAL:  Provide needs based on ASPEN/SCCM guidelines   MONITOR:   Vent status, Labs, Weight trends, I & O's, TF tolerance  REASON FOR ASSESSMENT:   Consult Assessment of nutrition requirement/status(Tube feeding recommendations)  ASSESSMENT: Patient is a 59 yo male with hx of COPD( Chronic O2), CHF, and recent pneumonia. He presents with short of breath. Failed BiPAP and is now intubated.   Patient is on ventilator support since 1/31.  MV: 10.1 L/min Temp (24hrs), Avg:99.1 F (37.3 C), Min:97.7 F (36.5 C), Max:100.8 F (38.2 C)  Propofol: 7.91  ml/hr (209 kcal q 24 hr)   Medications reviewed and include: lipitor, folvite, Prednisone, MVI  BMP Latest Ref Rng & Units 12/25/2019 12/25/2019 12/24/2019  Glucose 70 - 99 mg/dL 272(Z) 366(Y) 403(K)  BUN 6 - 20 mg/dL 15 12 10   Creatinine 0.61 - 1.24 mg/dL 7.42 5.95)  Sodium 135 - 145 mmol/L 138 137 142  Potassium 3.5 - 5.1 mmol/L 3.3(L) 3.7 4.5  Chloride 98 - 111 mmol/L 95(L) 97(L) 86(L)  CO2 22 - 32 mmol/L 34(H) 33(H) 42(H)  Calcium 8.9 - 10.3 mg/dL 8.2(L) 8.2(L) 9.4     Intake/Output Summary (Last 24 hours) at 12/25/2019 1342 Last data filed at 12/25/2019 1259 Gross per 24 hour  Intake 3089.49 ml  Output 600 ml  Net 2489.49 ml    Weight: stable wt hx-68-70 kg  NUTRITION - FOCUSED PHYSICAL EXAM: Unable to complete Nutrition-Focused physical exam at this time.     Diet Order:   Diet Order            Diet NPO time specified  Diet effective now              EDUCATION NEEDS:  No education needs have been identified at this  time Skin:  Skin Assessment: Reviewed RN Assessment(MASD-sacrum)  Last BM:     Height:   Ht Readings from Last 1 Encounters:  12/25/19 5\' 10"  (1.778 m)    Weight:   Wt Readings from Last 1 Encounters:  12/24/19 69.9 kg    Ideal Body Weight:   75 kg  BMI:  Body mass index is 22.1 kg/m.  Estimated Nutritional Needs:   Kcal:  1795  Protein:  140-155 gr  Fluid:  >1800 ml daily    MS,RD,CSG,LDN Office: [phone] Pager: [phone]

## 2019-12-25 NOTE — Progress Notes (Addendum)
CRITICAL VALUE ALERT  Critical Value: Lactic Acid, 3.6  Date & Time Notied:  12/25/19, 0300  Provider Notified: Teodoro Kil  Orders Received/Actions taken:

## 2019-12-25 NOTE — Progress Notes (Signed)
Morning ABG results PH 7.543 ,PCO2 43.1, PO2 107, Bicarb 36.9   Decreasing rate for  High PH to 18

## 2019-12-26 ENCOUNTER — Inpatient Hospital Stay (HOSPITAL_COMMUNITY): Payer: Medicare Other

## 2019-12-26 DIAGNOSIS — J690 Pneumonitis due to inhalation of food and vomit: Secondary | ICD-10-CM

## 2019-12-26 DIAGNOSIS — G9341 Metabolic encephalopathy: Secondary | ICD-10-CM

## 2019-12-26 DIAGNOSIS — I5033 Acute on chronic diastolic (congestive) heart failure: Secondary | ICD-10-CM

## 2019-12-26 LAB — GLUCOSE, CAPILLARY
Glucose-Capillary: 109 mg/dL — ABNORMAL HIGH (ref 70–99)
Glucose-Capillary: 140 mg/dL — ABNORMAL HIGH (ref 70–99)
Glucose-Capillary: 152 mg/dL — ABNORMAL HIGH (ref 70–99)
Glucose-Capillary: 152 mg/dL — ABNORMAL HIGH (ref 70–99)

## 2019-12-26 LAB — CBC
HCT: 33.2 % — ABNORMAL LOW (ref 39.0–52.0)
HCT: 37.3 % — ABNORMAL LOW (ref 39.0–52.0)
Hemoglobin: 10.4 g/dL — ABNORMAL LOW (ref 13.0–17.0)
Hemoglobin: 11.6 g/dL — ABNORMAL LOW (ref 13.0–17.0)
MCH: 29.1 pg (ref 26.0–34.0)
MCH: 28.9 pg (ref 26.0–34.0)
MCHC: 31.3 g/dL (ref 30.0–36.0)
MCHC: 31.1 g/dL (ref 30.0–36.0)
MCV: 92.7 fL (ref 80.0–100.0)
MCV: 92.8 fL (ref 80.0–100.0)
Platelets: 206 10*3/uL (ref 150–400)
Platelets: 254 10*3/uL (ref 150–400)
RBC: 3.58 MIL/uL — ABNORMAL LOW (ref 4.22–5.81)
RBC: 4.02 MIL/uL — ABNORMAL LOW (ref 4.22–5.81)
RDW: 13.1 % (ref 11.5–15.5)
RDW: 13.4 % (ref 11.5–15.5)
WBC: 6.4 10*3/uL (ref 4.0–10.5)
WBC: 9.8 10*3/uL (ref 4.0–10.5)
nRBC: 0 % (ref 0.0–0.2)
nRBC: 0 % (ref 0.0–0.2)

## 2019-12-26 LAB — BASIC METABOLIC PANEL
Anion gap: 11 (ref 5–15)
BUN: 22 mg/dL — ABNORMAL HIGH (ref 6–20)
CO2: 35 mmol/L — ABNORMAL HIGH (ref 22–32)
Calcium: 8.3 mg/dL — ABNORMAL LOW (ref 8.9–10.3)
Chloride: 92 mmol/L — ABNORMAL LOW (ref 98–111)
Creatinine, Ser: 0.64 mg/dL (ref 0.61–1.24)
GFR calc Af Amer: >60 mL/min (ref >60)
GFR calc non Af Amer: >60 mL/min (ref >60)
Glucose, Bld: 145 mg/dL — ABNORMAL HIGH (ref 70–99)
Potassium: 3.5 mmol/L (ref 3.5–5.1)
Sodium: 138 mmol/L (ref 135–145)

## 2019-12-26 LAB — URINE CULTURE: Culture: NO GROWTH

## 2019-12-26 LAB — BRAIN NATRIURETIC PEPTIDE: B Natriuretic Peptide: 187 pg/mL — ABNORMAL HIGH (ref 0.0–100.0)

## 2019-12-26 LAB — MAGNESIUM: Magnesium: 2 mg/dL (ref 1.7–2.4)

## 2019-12-26 LAB — PHOSPHORUS: Phosphorus: 3 mg/dL (ref 2.5–4.6)

## 2019-12-26 MED ORDER — LORAZEPAM 2 MG/ML IJ SOLN
1.0000 mg | INTRAMUSCULAR | Status: DC | PRN
  Administered 2019-12-26 – 2019-12-28 (×8): 2 mg via INTRAVENOUS
  Filled 2019-12-26 (×8): qty 1

## 2019-12-26 MED ORDER — FOLIC ACID 1 MG PO TABS: 1.0000 mg | ORAL_TABLET | Freq: Every day | ORAL | Status: DC

## 2019-12-26 MED ORDER — ACETAMINOPHEN 650 MG RE SUPP: 650.0000 mg | Freq: Four times a day (QID) | RECTAL | Status: DC | PRN

## 2019-12-26 MED ORDER — VALPROIC ACID 250 MG PO CAPS
500.0000 mg | ORAL_CAPSULE | Freq: Three times a day (TID) | ORAL | Status: DC
  Administered 2019-12-26 – 2019-12-31 (×8): 500 mg via ORAL
  Filled 2019-12-26 (×18): qty 2

## 2019-12-26 MED ORDER — ADULT MULTIVITAMIN W/MINERALS CH
1.0000 | ORAL_TABLET | Freq: Every day | ORAL | Status: DC
  Administered 2019-12-27 – 2020-01-10 (×14): 1 via ORAL
  Filled 2019-12-26 (×16): qty 1

## 2019-12-26 MED ORDER — FOLIC ACID 1 MG PO TABS
1.0000 mg | ORAL_TABLET | Freq: Every day | ORAL | Status: DC
  Administered 2019-12-27 – 2020-01-10 (×14): 1 mg via ORAL
  Filled 2019-12-26 (×16): qty 1

## 2019-12-26 MED ORDER — ORAL CARE MOUTH RINSE
15.0000 mL | Freq: Two times a day (BID) | OROMUCOSAL | Status: DC
  Administered 2019-12-26 – 2020-01-10 (×29): 15 mL via OROMUCOSAL

## 2019-12-26 MED ORDER — ACETAMINOPHEN 325 MG PO TABS: 650.0000 mg | ORAL_TABLET | Freq: Four times a day (QID) | ORAL | Status: DC | PRN

## 2019-12-26 MED ORDER — POTASSIUM CHLORIDE 20 MEQ/15ML (10%) PO SOLN
20.0000 meq | Freq: Once | ORAL | Status: AC
  Administered 2019-12-26: 20 meq
  Filled 2019-12-26: qty 30

## 2019-12-26 MED ORDER — THIAMINE HCL 100 MG/ML IJ SOLN
100.0000 mg | Freq: Every day | INTRAMUSCULAR | Status: DC
  Administered 2019-12-27 – 2020-01-09 (×3): 100 mg via INTRAVENOUS
  Filled 2019-12-26 (×5): qty 2

## 2019-12-26 MED ORDER — METOPROLOL TARTRATE 5 MG/5ML IV SOLN
2.5000 mg | Freq: Four times a day (QID) | INTRAVENOUS | Status: DC
  Administered 2019-12-26 – 2019-12-29 (×12): 2.5 mg via INTRAVENOUS
  Filled 2019-12-26 (×13): qty 5

## 2019-12-26 MED ORDER — FUROSEMIDE 10 MG/ML IJ SOLN
40.0000 mg | Freq: Every day | INTRAMUSCULAR | Status: DC
  Administered 2019-12-26 – 2019-12-27 (×2): 40 mg via INTRAVENOUS
  Filled 2019-12-26 (×2): qty 4

## 2019-12-26 MED ORDER — LORAZEPAM 1 MG PO TABS
1.0000 mg | ORAL_TABLET | ORAL | Status: DC | PRN
  Administered 2019-12-27: 1 mg via ORAL
  Filled 2019-12-26: qty 1

## 2019-12-26 MED ORDER — OLANZAPINE 5 MG PO TABS
10.0000 mg | ORAL_TABLET | Freq: Every day | ORAL | Status: DC
  Administered 2019-12-27 – 2020-01-04 (×8): 10 mg via ORAL
  Filled 2019-12-26 (×10): qty 2

## 2019-12-26 MED ORDER — ADULT MULTIVITAMIN W/MINERALS CH
1.0000 | ORAL_TABLET | Freq: Every day | ORAL | Status: DC
  Administered 2019-12-26: 12:00:00 1 via ORAL

## 2019-12-26 MED ORDER — PANTOPRAZOLE SODIUM 40 MG PO TBEC
40.0000 mg | DELAYED_RELEASE_TABLET | Freq: Every day | ORAL | Status: DC
  Administered 2019-12-26 – 2020-01-10 (×15): 40 mg via ORAL
  Filled 2019-12-26 (×16): qty 1

## 2019-12-26 MED ORDER — FLUOXETINE HCL 20 MG PO CAPS
40.0000 mg | ORAL_CAPSULE | Freq: Every day | ORAL | Status: DC
  Administered 2019-12-27 – 2020-01-10 (×14): 40 mg via ORAL
  Filled 2019-12-26 (×16): qty 2

## 2019-12-26 MED ORDER — VALPROATE SODIUM 250 MG/5ML PO SOLN
ORAL | Status: AC
  Filled 2019-12-26: qty 10

## 2019-12-26 MED ORDER — GUAIFENESIN-DM 100-10 MG/5ML PO SYRP: 10.0000 mL | ORAL_SOLUTION | ORAL | Status: DC | PRN

## 2019-12-26 MED ORDER — THIAMINE HCL 100 MG PO TABS
100.0000 mg | ORAL_TABLET | Freq: Every day | ORAL | Status: DC
  Administered 2019-12-29 – 2020-01-10 (×12): 100 mg via ORAL
  Filled 2019-12-26 (×15): qty 1

## 2019-12-26 NOTE — Plan of Care (Signed)
  Problem: Acute Rehab PT Goals(only PT should resolve) Goal: Pt Will Go Supine/Side To Sit Outcome: Progressing Flowsheets (Taken 12/26/2019 1510) Pt will go Supine/Side to Sit: with supervision Goal: Pt Will Go Sit To Supine/Side Outcome: Progressing Flowsheets (Taken 12/26/2019 1510) Pt will go Sit to Supine/Side: with supervision Goal: Patient Will Transfer Sit To/From Stand Outcome: Progressing Flowsheets (Taken 12/26/2019 1510) Patient will transfer sit to/from stand: with modified independence Goal: Pt Will Transfer Bed To Chair/Chair To Bed Outcome: Progressing Flowsheets (Taken 12/26/2019 1510) Pt will Transfer Bed to Chair/Chair to Bed: with modified independence Goal: Pt Will Ambulate Outcome: Progressing Flowsheets (Taken 12/26/2019 1510) Pt will Ambulate: 75 feet Goal: Pt/caregiver will Perform Home Exercise Program Outcome: Progressing Flowsheets (Taken 12/26/2019 1510) Pt/caregiver will Perform Home Exercise Program:  For increased strengthening  For improved balance  Independently  3:11 PM, 12/26/19 Wyman Songster PT, DPT Physical Therapist at [facility]

## 2019-12-26 NOTE — Progress Notes (Signed)
NAME:  Jeffrey [name], MRN:  [id number], DOB:  1961/10/23, LOS: 2 ADMISSION DATE:  12/24/2019, CONSULTATION DATE:  12/25/2019 REFERRING MD:  Dr. Arbutus Leas, [facility], CHIEF COMPLAINT:  Short of breath   Brief History   59 yo male smoker brought to ER with altered mental status, tremor and hypoxia.  Has hx of COPD and wears 3 to 4 liters oxygen at home  Reported dyspnea for 3 days prior to admission and had recent treatment for pneumonia.  Found to have hypoxia and hypercapnia.  Started on treated for COPD exacerbation and HCAP.  Failed to improve with Bipap and required intubation.  Past Medical History  COPD, Bipolar, Diastolic CHF, Hepatic encephalopathy, Depression, Polysubstance abuse, [name], ETOH, GI bleed  Significant Hospital Events   1/31 Admit  Consults:    Procedures:  ETT 2/01 >> 2/02  Significant Diagnostic Tests:  PFT 10/09/16 >> FEV1 1.84 (49%), FEV1% 48, TLC 7.39 (105%), DLCO 36% A1AT 10/09/16 >> 215, MM Spirometry 02/02/17 >> FEV1 2.00 (55%), FEV1% 54 CT head 12/24/19 >> normal  Micro Data:  SARS CoV2 PCR 1/31 >> negative Influenza PCR 1/31 >> negative Blood 1/31 >>  Sputum 2/01 >>  Antimicrobials:  Zosyn 1/31 >>   Interim history/subjective:  Pressure support.  Objective   Blood pressure 119/62, pulse (!) 104, temperature 99.1 F (37.3 C), resp. rate (!) 26, height 5\' 10"  (1.778 m), weight 73.5 kg, SpO2 93 %.    Vent Mode: PSV;CPAP FiO2 (%):  [40 %] 40 % Set Rate:  [16 bmp-18 bmp] 16 bmp Vt Set:  [580 mL] 580 mL PEEP:  [5 cmH20] 5 cmH20 Pressure Support:  [8 cmH20] 8 cmH20 Plateau Pressure:  [14 cmH20-18 cmH20] 18 cmH20   Intake/Output Summary (Last 24 hours) at 12/26/2019 1124 Last data filed at 12/26/2019 02/23/2020 Gross per 24 hour  Intake 2120.64 ml  Output 3200 ml  Net -1079.36 ml   Filed Weights   12/24/19 1506 12/26/19 0400  Weight: 69.9 kg 73.5 kg    Examination:  General - sedated Eyes - pupils reactive ENT - ETT in place Cardiac - regular  rate/rhythm, no murmur Chest - decreased BS, no wheeze Abdomen - soft, non tender, + bowel sounds Extremities - no cyanosis, clubbing, or edema Skin - no rashes Neuro - RASS -1   Resolved Hospital Problem list   Acute metabolic encephalopathy  Assessment & Plan:   Acute on chronic hypoxic/hypercapnic respiratory failure from AECOPD and aspiration pneumonia. 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 - extubate - continue solumedrol - scheduled BDs - day 3 of ABx  Hx of bipolar with depression. - continue prozac, zyprexa, valproic acid  Hypokalemia. Hypophosphatemia. - f/u labs  Anemia of critical illness and chronic disease. - f/u CBC - transfuse for Hb < 7 or significant bleeding  Chronic diastolic CHF, [name]. - continue ASA, lipitor - hold outpt lopressor   Best practice:  Diet: tube feeds DVT prophylaxis: lovenox GI prophylaxis: protonix Mobility: bed rest Code Status: full code Disposition: ICU  Labs    CMP Latest Ref Rng & Units 12/26/2019 12/25/2019 12/25/2019  Glucose 70 - 99 mg/dL 02/22/2020) 948(N) 462(V)  BUN 6 - 20 mg/dL 035(K) 15 12  Creatinine 0.61 - 1.24 mg/dL 09(F 8.18 2.99  Sodium 135 - 145 mmol/L 138 138 137  Potassium 3.5 - 5.1 mmol/L 3.5 3.3(L) 3.7  Chloride 98 - 111 mmol/L 92(L) 95(L) 97(L)  CO2 22 - 32 mmol/L 35(H) 34(H) 33(H)  Calcium  8.9 - 10.3 mg/dL 8.3(L) 8.2(L) 8.2(L)  Total Protein 6.5 - 8.1 g/dL - 5.2(L) -  Total Bilirubin 0.3 - 1.2 mg/dL - 0.6 -  Alkaline Phos 38 - 126 U/L - 58 -  AST 15 - 41 U/L - 19 -  ALT 0 - 44 U/L - 14 -   CBC Latest Ref Rng & Units 12/26/2019 12/25/2019 12/24/2019  WBC 4.0 - 10.5 K/uL 6.4 4.2 4.2  Hemoglobin 13.0 - 17.0 g/dL 10.4(L) 9.7(L) 11.1(L)  Hematocrit 39.0 - 52.0 % 33.2(L) 31.6(L) 37.2(L)  Platelets 150 - 400 K/uL 206 192 208   ABG    Component Value Date/Time   PHART 7.543 (H) 12/25/2019 0428   PCO2ART 43.1 12/25/2019 0428   PO2ART 107 12/25/2019 0428   HCO3 36.9 (H) 12/25/2019 0428    TCO2 34 (H) 12/21/2018 0357   ACIDBASEDEF 0.6 02/17/2019 1850   O2SAT 98.8 12/25/2019 0428   CBG (last 3)  Recent Labs    12/26/19 0023 12/26/19 0416 12/26/19 0726  GLUCAP 152* 140* 109*    CC time 32 minutes  Chesley Mires, MD [place] 12/26/2019, 11:30 AM

## 2019-12-26 NOTE — Progress Notes (Addendum)
PROGRESS NOTE  Jeffrey [name] HCW:[id number] DOB: May 17, 1961 DOA: 12/24/2019 PCP: Lemmie Evens, MD  Brief History:  59 year old male with a history of COPD, chronic respiratory failure on 3 to 4 L nasal cannula, diastolic CHF, bipolar disorder, GI bleed, alcohol abuse, hypertension, hyperlipidemia presenting with 3-day history of shortness of breath and confusion.  Upon EMS arrival, the patient was noted to have oxygen saturation in the 80s.  He was given Solu-Medrol in route to the hospital.  History was limited in the emergency department secondary to the patient's confusion and extremis and respiratory distress.  The patient was initially placed on BiPAP and given Ativan.  He continued to be confused and interfere with medical therapy.  As result, the patient was subsequently intubated in the emergency department.  The patient's wife stated that the patient had had some passive suicidal ideations stating that he wished someone would just shoot him.  In addition, the patient had stopped using his home oxygen and other medical therapies.  Notably, the patient was just admitted to the hospital from 10/12/2019 to 10/16/2019 secondary to acute on chronic respiratory failure resulting from COPD exacerbation and acute on chronic diastolic CHF.  The patient was discharged home with furosemide 40 mg daily.  His discharge weight was 150 pounds.  He had a telemedicine visit with Dr. Bronson Ing on 11/29/2019, and he was instructed to continue taking his furosemide.  In the emergency department, the patient had a temperature 100.8 F.  He was initially hemodynamically stable, but blood pressures became soft after intubation.  WBC 4.2, hemoglobin 9.7, platelets 192,000.  BMP was essentially unremarkable.  Chest x-ray showed bibasilar infiltrates, left greater than right.  There was chronic interstitial prominence.  The patient was started on vancomycin and Zosyn.  CT of the brain was negative for any  acute findings.  PCCM consulted to assist.  Assessment/Plan: Acute on chronic respiratory failure with hypoxia and hypercarbia -Secondary to COPD exacerbation, pneumonia -Currently intubated and sedated -PCCM consult appreciated -Wean to extubation--begin weaning trial 12/26/19 -Continue enteral feeding--tolerating -SARS-CoV2 RT-PCR neg  Aspiration pneumonitis -Check procalcitonin <0.10 -Tracheal aspirate for culture -MRSA screen--neg -d/c vanco -Continue Zosyn for now -12/26/19--personally reviewed CXR--small bilateral pleural effusion, improving chronic intersititial markings -plan to de-escalate antibiotics in next 24 hours if stable and cultures unrevealing  COPD exacerbation -Continue duo nebs -Continue Pulmicort -Continue IV Solu-Medrol  Acute on chronic diastolic CHF -Saline lock IV fluids -NEG 1.3L in last 24 hours -on lasix 40 mg daily at home -restart  IV lasix 40 mg daily today -10/12/2019 echo EF 65-70%, grade 1 DD, trivial TR, mild MR -Personally reviewed EKG--sinus rhythm, nonspecific T wave change -Daily weights  Acute metabolic encephalopathy -Multifactorial including hypoxia/hypercarbia, infectious process, hyperammonemia -UA neg for pyuria -12/24/19 CT brain negative  Essential Hypertension -holding metoprolol due to soft BP  Bipolar disorder -Restart Depakote, olanzapine, fluoxetine -psychiatry eval once pt is stable  Sinus tachycardia -partly due to BB rebound      Disposition Plan:   Remain in ICU Family Communication:  spouse updated 2/2 on phone  Consultants:  PCCM  Code Status:  FULL   DVT Prophylaxis:  [place] Lovenox   Procedures: As Listed in Progress Note Above  Antibiotics: vanco 1/31>>>2/1 Zosyn 1/31>>>     Subjective: Pt remains intubated and sedated.  No reports of vomiting, diarrhea, respiratory distress.  Tolerating enteral feeds  Objective: Vitals:   12/26/19 0900 12/26/19 0915 12/26/19 0930  12/26/19 1000  BP: 119/62     Pulse: (!) 106  (!) 112 (!) 102  Resp: (!) 22  (!) 35 (!) 22  Temp: 99.1 F (37.3 C) 99.2 F (37.3 C) 99.3 F (37.4 C) 99.1 F (37.3 C)  TempSrc:      SpO2: 93%  97% 94%  Weight:      Height:        Intake/Output Summary (Last 24 hours) at 12/26/2019 1015 Last data filed at 12/26/2019 6195 Gross per 24 hour  Intake 2120.64 ml  Output 3200 ml  Net -1079.36 ml   Weight change: 3.646 kg Exam:   General:  Pt is sedated on vent.  No distresss  HEENT: No icterus, No thrush, No neck mass, [place]/AT  Cardiovascular: RRR, S1/S2, no rubs, no gallops  Respiratory: bilateral crackles.  Bibasilar wheeze  Abdomen: Soft/+BS, non tender, non distended, no guarding  Extremities: 1+ LE edema, No lymphangitis, No petechiae, No rashes, no synovitis   Data Reviewed: I have personally reviewed following labs and imaging studies Basic Metabolic Panel: Recent Labs  Lab 12/24/19 1703 12/24/19 1830 12/24/19 2202 12/25/19 0209 12/25/19 0841 12/25/19 1311 12/25/19 1655 12/26/19 0403  NA 142  --   --  137 138  --   --  138  K 4.5  --   --  3.7 3.3*  --   --  3.5  CL 86*  --   --  97* 95*  --   --  92*  CO2 42*  --   --  33* 34*  --   --  35*  GLUCOSE 121*  --   --  134* 139*  --   --  145*  BUN 10  --   --  12 15  --   --  22*  CREATININE 0.50*  --   --  0.67 0.61  --   --  0.64  CALCIUM 9.4  --   --  8.2* 8.2*  --   --  8.3*  MG  --    < > 2.4  --  2.1 2.1 2.0 2.0  PHOS 2.5   < > 0.7*  --  2.1* 2.9 3.4 3.0   < > = values in this interval not displayed.   Liver Function Tests: Recent Labs  Lab 12/24/19 1703 12/25/19 0841  AST 26 19  ALT 19 14  ALKPHOS 82 58  BILITOT 0.6 0.6  PROT 7.1 5.2*  ALBUMIN 3.4* 2.5*   No results for input(s): LIPASE, AMYLASE in the last 168 hours. Recent Labs  Lab 12/24/19 1703 12/25/19 0841  AMMONIA 56* 30   Coagulation Profile: No results for input(s): INR, PROTIME in the last 168 hours. CBC: Recent Labs  Lab  12/24/19 1703 12/24/19 2202 12/25/19 0209 12/26/19 0403  WBC 6.7 4.2 4.2 6.4  NEUTROABS 6.4  --   --   --   HGB 11.7* 11.1* 9.7* 10.4*  HCT 39.6 37.2* 31.6* 33.2*  MCV 96.4 96.9 95.2 92.7  PLT 226 208 192 206   Cardiac Enzymes: No results for input(s): CKTOTAL, CKMB, CKMBINDEX, TROPONINI in the last 168 hours. BNP: Invalid input(s): POCBNP CBG: Recent Labs  Lab 12/25/19 1619 12/25/19 1954 12/26/19 0023 12/26/19 0416 12/26/19 0726  GLUCAP 145* 138* 152* 140* 109*   HbA1C: No results for input(s): HGBA1C in the last 72 hours. Urine analysis:    Component Value Date/Time   COLORURINE AMBER (A) 12/25/2019 0912   APPEARANCEUR CLEAR 12/25/2019 0912  LABSPEC 1.042 (H) 12/25/2019 0912   PHURINE 6.0 12/25/2019 0912   GLUCOSEU NEGATIVE 12/25/2019 0912   HGBUR NEGATIVE 12/25/2019 0912   BILIRUBINUR MODERATE (A) 12/25/2019 0912   KETONESUR NEGATIVE 12/25/2019 0912   PROTEINUR 30 (A) 12/25/2019 0912   UROBILINOGEN 0.2 03/01/2011 1427   NITRITE NEGATIVE 12/25/2019 0912   LEUKOCYTESUR NEGATIVE 12/25/2019 0912   Sepsis Labs: _0 (procalcitonin:4,lacticidven:4) ) Recent Results (from the past 240 hour(s))  Respiratory Panel by RT PCR (Flu A&B, Covid) - Nasopharyngeal Swab     Status: None   Collection Time: 12/24/19  3:38 PM   Specimen: Nasopharyngeal Swab  Result Value Ref Range Status   SARS Coronavirus 2 by RT PCR NEGATIVE NEGATIVE Final    Comment: (NOTE) SARS-CoV-2 target nucleic acids are NOT DETECTED. The SARS-CoV-2 RNA is generally detectable in upper respiratoy specimens during the acute phase of infection. The lowest concentration of SARS-CoV-2 viral copies this assay can detect is 131 copies/mL. A negative result does not preclude SARS-Cov-2 infection and should not be used as the sole basis for treatment or other patient management decisions. A negative result may occur with  improper specimen collection/handling, submission of specimen other than  nasopharyngeal swab, presence of viral mutation(s) within the areas targeted by this assay, and inadequate number of viral copies (<131 copies/mL). A negative result must be combined with clinical observations, patient history, and epidemiological information. The expected result is Negative. Fact Sheet for Patients:  PinkCheek.be Fact Sheet for Healthcare Providers:  GravelBags.it This test is not yet ap proved or cleared by the [place] FDA and  has been authorized for detection and/or diagnosis of SARS-CoV-2 by FDA under an Emergency Use Authorization (EUA). This EUA will remain  in effect (meaning this test can be used) for the duration of the COVID-19 declaration under Section 564(b)(1) of the Act, 21 U.S.C. section 360bbb-3(b)(1), unless the authorization is terminated or revoked sooner.    Influenza A by PCR NEGATIVE NEGATIVE Final   Influenza B by PCR NEGATIVE NEGATIVE Final    Comment: (NOTE) The Xpert Xpress SARS-CoV-2/FLU/RSV assay is intended as an aid in  the diagnosis of influenza from Nasopharyngeal swab specimens and  should not be used as a sole basis for treatment. Nasal washings and  aspirates are unacceptable for Xpert Xpress SARS-CoV-2/FLU/RSV  testing. Fact Sheet for Patients: PinkCheek.be Fact Sheet for Healthcare Providers: GravelBags.it This test is not yet approved or cleared by the [place] FDA and  has been authorized for detection and/or diagnosis of SARS-CoV-2 by  FDA under an Emergency Use Authorization (EUA). This EUA will remain  in effect (meaning this test can be used) for the duration of the  Covid-19 declaration under Section 564(b)(1) of the Act, 21  U.S.C. section 360bbb-3(b)(1), unless the authorization is  terminated or revoked. Performed at [facility], [address] [place], [place] [address]   Blood culture  (routine x 2)     Status: None (Preliminary result)   Collection Time: 12/24/19  5:04 PM   Specimen: BLOOD LEFT HAND  Result Value Ref Range Status   Specimen Description BLOOD LEFT HAND  Final   Special Requests   Final    BOTTLES DRAWN AEROBIC AND ANAEROBIC Blood Culture adequate volume   Culture   Final    NO GROWTH 2 DAYS Performed at [facility], [address] [place], [place] [address]    Report Status PENDING  Incomplete  Blood culture (routine x 2)     Status: None (  Preliminary result)   Collection Time: 12/24/19  5:05 PM   Specimen: BLOOD LEFT HAND  Result Value Ref Range Status   Specimen Description BLOOD LEFT HAND  Final   Special Requests   Final    BOTTLES DRAWN AEROBIC AND ANAEROBIC Blood Culture adequate volume   Culture   Final    NO GROWTH 2 DAYS Performed at [facility], [address] [place], [place] [address]    Report Status PENDING  Incomplete  MRSA PCR Screening     Status: None   Collection Time: 12/24/19  8:59 PM   Specimen: Nasopharyngeal  Result Value Ref Range Status   MRSA by PCR NEGATIVE NEGATIVE Final    Comment:        The GeneXpert MRSA Assay (FDA approved for NASAL specimens only), is one component of a comprehensive MRSA colonization surveillance program. It is not intended to diagnose MRSA infection nor to guide or monitor treatment for MRSA infections. Performed at [facility], [address] [place], [place] [address]   Culture, respiratory (non-expectorated)     Status: None (Preliminary result)   Collection Time: 12/25/19  8:21 AM   Specimen: Tracheal Aspirate; Respiratory  Result Value Ref Range Status   Specimen Description   Final    TRACHEAL ASPIRATE COLLECTED BY RT Performed at [facility], [address] [place], [place] [address]    Special Requests   Final    NONE Performed at [facility], [address], [place], [place] [address]    Gram Stain   Final    FEW WBC PRESENT, PREDOMINANTLY PMN NO ORGANISMS  SEEN    Culture   Final    NO GROWTH < 24 HOURS Performed at [facility] [address], [place], [place] [address]    Report Status PENDING  Incomplete  MRSA PCR Screening     Status: None   Collection Time: 12/25/19  9:11 AM   Specimen: Nasal Mucosa; Nasopharyngeal  Result Value Ref Range Status   MRSA by PCR NEGATIVE NEGATIVE Final    Comment:        The GeneXpert MRSA Assay (FDA approved for NASAL specimens only), is one component of a comprehensive MRSA colonization surveillance program. It is not intended to diagnose MRSA infection nor to guide or monitor treatment for MRSA infections. Performed at [facility], [address] [place], [place] [address]   Culture, Urine     Status: None   Collection Time: 12/25/19  9:12 AM   Specimen: Urine, Clean Catch  Result Value Ref Range Status   Specimen Description   Final    URINE, CLEAN CATCH Performed at [facility], [address] [place], [place] [address]    Special Requests   Final    NONE Performed at [facility], [address], [place], [place] [address]    Culture   Final    NO GROWTH Performed at [facility], [place] [address], [place], [place] [address]    Report Status 12/26/2019 FINAL  Final     Scheduled Meds: . aspirin EC  81 mg Oral QPM  . atorvastatin  20 mg Oral QPM  . budesonide (PULMICORT) nebulizer solution  0.5 mg Nebulization BID  . chlorhexidine gluconate (MEDLINE KIT)  15 mL Mouth Rinse BID  . Chlorhexidine Gluconate Cloth  6 each Topical Daily  . enoxaparin (LOVENOX) injection  40 mg Subcutaneous Q24H  . feeding supplement (VITAL HIGH PROTEIN)  1,000 mL Per Tube  Q24H  . FLUoxetine  40 mg Per Tube Daily  . folic acid  1 mg Per Tube Daily  . ipratropium-albuterol  3 mL Nebulization Q6H  . mouth rinse  15 mL Mouth Rinse 10 times per day  . methylPREDNISolone (SOLU-MEDROL) injection  60 mg Intravenous Q12H  . multivitamin  15 mL Per Tube Daily  . OLANZapine  10 mg Per Tube QHS   . pantoprazole sodium  40 mg Per Tube Q24H  . thiamine  100 mg Per Tube Daily   Or  . thiamine  100 mg Intravenous Daily  . valproic acid  500 mg Per Tube Q8H   Continuous Infusions: . lactated ringers 10 mL/hr at 12/25/19 1208  . piperacillin-tazobactam (ZOSYN)  IV Stopped (12/26/19 0917)  . propofol (DIPRIVAN) infusion 40 mcg/kg/min (12/26/19 0937)  . sodium phosphate  Dextrose 5% IVPB      Procedures/Studies: CT Head Wo Contrast  Result Date: 12/24/2019 CLINICAL DATA:  Altered mental status EXAM: CT HEAD WITHOUT CONTRAST TECHNIQUE: Contiguous axial images were obtained from the base of the skull through the vertex without intravenous contrast. COMPARISON:  11/21/2019 FINDINGS: Brain: No evidence of acute infarction, hemorrhage, hydrocephalus, extra-axial collection or mass lesion/mass effect. Vascular: No hyperdense vessel or unexpected calcification. Skull: Normal. Negative for fracture or focal lesion. Sinuses/Orbits: No acute finding. Other: None. IMPRESSION: Normal head CT for age. Electronically Signed   By: Inez Catalina M.D.   On: 12/24/2019 20:57   DG Chest 1V REPEAT Same Day  Result Date: 12/24/2019 CLINICAL DATA:  Post intubation. EXAM: CHEST - 1 VIEW SAME DAY COMPARISON:  Multiple chest x-rays since February 26, 2019 FINDINGS: The distal tip of the ET tube is difficult to see with confidence but appears to terminate 8.3 cm above the carina and 5 cm below the thoracic inlet, in adequate position. The NG tube terminates below today's film. No pneumothorax. Coarsened lung markings are seen bilaterally. The focal opacity in the right base seen earlier today is no longer visualized. No focal infiltrate seen in the left base either. There is thickening of the left pleural stripe near the base which is new since April of 2020. The cardiomediastinal silhouette is stable. IMPRESSION: 1. No focal infiltrates. 2. Thickening of the left inferior pleural stripe is consistent with pleural fluid,  suggestive of loculation. 3. Coarsened lung markings are similar since December 04, 2019 and consistent with the patient's underlying emphysema. A superimposed atypical infection or pulmonary venous congestion are not excluded. Electronically Signed   By: Dorise Bullion III M.D   On: 12/24/2019 17:39   DG Chest Port 1 View  Result Date: 12/26/2019 CLINICAL DATA:  Respiratory failure. EXAM: PORTABLE CHEST 1 VIEW COMPARISON:  12/24/2019 FINDINGS: The endotracheal tube is 4 cm above the carina. The NG tube is coursing down the esophagus and into the stomach. Underlying chronic emphysematous changes and pulmonary scarring. Small bilateral pleural effusions and overlying atelectasis along with probable some loculated fluid in the major fissure on the right. No definite infiltrates. IMPRESSION: 1. Endotracheal tube and NG tubes in good position. 2. Chronic underlying lung disease/emphysema. 3. Small bilateral pleural effusions and overlying atelectasis. Electronically Signed   By: Marijo Sanes M.D.   On: 12/26/2019 05:17   DG CHEST PORT 1 VIEW  Result Date: 12/24/2019 CLINICAL DATA:  Check gastric catheter placement EXAM: PORTABLE CHEST 1 VIEW COMPARISON:  12/24/2019 FINDINGS: Cardiac shadow is within normal limits. Endotracheal tube and gastric catheter are again noted and in satisfactory position.  The lungs are again well aerated. Small loculated pleural effusion on the left inferiorly is noted. Patchy interstitial changes are noted similar to that seen on the prior exam. No bony abnormality is seen. IMPRESSION: Endotracheal tube and gastric catheter in satisfactory position. Stable interstitial markings and left-sided pleural effusion. Electronically Signed   By: Inez Catalina M.D.   On: 12/24/2019 21:55   DG Chest Portable 1 View  Result Date: 12/24/2019 CLINICAL DATA:  Short of breath for 3 days. COVID-19 negative. CHF. COPD. Recurrent pneumonia. EXAM: PORTABLE CHEST 1 VIEW COMPARISON:  12/07/2019.  Chest  CT of 03/26/2019 FINDINGS: Midline trachea. Normal heart size. No pneumothorax. Small left pleural effusion with possible mild loculation laterally. Moderate diffuse interstitial prominence. Left worse than right base airspace disease. IMPRESSION: Development of left greater than right base airspace disease, superimposed upon chronic moderate interstitial thickening. Favor infection or aspiration. Small left pleural effusion, with possible loculation laterally. Electronically Signed   By: Abigail Miyamoto M.D.   On: 12/24/2019 16:20   DG Chest Portable 1 View  Result Date: 12/07/2019 CLINICAL DATA:  59 year old male with shortness of breath for 3 days. Previously negative for COVID-19, not being tested at this time. EXAM: PORTABLE CHEST 1 VIEW COMPARISON:  Portable chest 12/04/2019 and earlier. FINDINGS: Portable AP upright view at 0220 hours. Lung volumes are stable. Mediastinal contours remain normal. Visualized tracheal air column is within normal limits. Coarse bilateral increased pulmonary interstitial opacity remains stable since last month. No pneumothorax, pleural effusion or new pulmonary opacity. No acute osseous abnormality identified. Negative visible bowel gas pattern. IMPRESSION: Stable portable chest with suspected chronic lung disease and no definite cardiopulmonary abnormality. Electronically Signed   By: Genevie Ann M.D.   On: 12/07/2019 02:41   DG Chest Port 1 View  Result Date: 12/04/2019 CLINICAL DATA:  Shortness of breath EXAM: PORTABLE CHEST 1 VIEW COMPARISON:  November 21, 2019 and October 15, 2019 chest radiograph; chest CT Mar 26, 2019 FINDINGS: There is a degree of hyperexpansion, stable. There is underlying fibrotic type change without frank edema or consolidation apparent. No new opacity appreciable. Heart size is normal. Pulmonary vascularity is stable and within normal limits. No adenopathy. No bone lesions. IMPRESSION: Findings indicative of underlying COPD with scattered fibrosis  throughout the lungs. No frank edema or focal airspace opacity. Cardiac silhouette within normal limits and stable. No adenopathy evident. Electronically Signed   By: Lowella Grip III M.D.   On: 12/04/2019 10:08    Orson Eva, DO  [facility] Pager [phone]  If 7PM-7AM, please contact night-coverage [email or web address] Password [email or web address] 12/26/2019, 10:15 AM   LOS: 2 days

## 2019-12-26 NOTE — Progress Notes (Signed)
Vital High Protein infusing via OG tube. Tube feed initiated by A. Shelton RN around 1300 12/25/19 at 40. Residual and placement verified. Patient tolerating well. Rate of feed increased to 55 and will be increased another 10 at 0600 as order and note from dietician. Continue to monitor.

## 2019-12-26 NOTE — Progress Notes (Signed)
Vital High Protein increased to 55. Patient tolerating well. Continue to monitor.

## 2019-12-26 NOTE — Progress Notes (Signed)
Began wean on CPAP/PSV at this time. Patient is still sleepy but responding to weaning. His VTs look acceptable as well as his Hemos and SPO2. RT will cont to monitor and RN will cont to wean sedation as sees fit.

## 2019-12-26 NOTE — Progress Notes (Signed)
Extubated patient per MD order. The patient was placed on 4lpm . This is home setting. SPO2 at the time of extubation ranges from 94%-97%. There were no complications noted. Pt is refusing IS at this time. I will attempt with 1400 nebulizer treatment.

## 2019-12-26 NOTE — Evaluation (Signed)
Physical Therapy Evaluation Patient Details Name: Jeffrey [name] MRN: [id number] DOB: [birth year] Today's Date: 12/26/2019   History of Present Illness  MANAN OLMO is a 59 y.o. male with medical history significant for COPD with chronic respiratory failure, bipolar disorder, diastolic CHF, hepatic encephalopathy, depression. History is obtained from chart review and talking to patient's ex-wife.  Per chart review patient was brought into the ED reports of difficulty breathing over the past 3 days, with confusion, and in the ED patient was tremulous. Patient's ex-wife who now lives with patient tells me patient has not had any drink or use any drugs in the past year.  She reports increasing difficulty breathing and productive cough.  No vomiting no loose stools.  She said the past 3 days when patient was confused tells when he said he wanted someone to shoot him.  She otherwise denies suicidal ideations.  She also reports patient has gotten tired of using oxygen, his bronchodilators, and lying around as normally he likes to be active.     Clinical Impression  Patient limited for functional mobility as stated below secondary to BLE weakness, fatigue and poor standing balance. He appears confused throughout today's session and is a poor historian of home set up/ equipment and PLOF. Patients SpO2 was monitored throughout session and it around 92% while on supplemental O2 and dropped during coughing bursts which increased shortly after. He is able to transfer to standing with use of RW and requires frequent verbal cueing for sequencing. He is limited to several small steps at bedside to chair. He tolerates sitting up in chair to end session - left with nursing present. Patient will benefit from continued physical therapy in hospital and recommended venue below to increase strength, balance, endurance for safe ADLs and gait.     Follow Up Recommendations SNF    Equipment Recommendations  None  recommended by PT    Recommendations for Other Services       Precautions / Restrictions Precautions Precautions: Fall Restrictions Weight Bearing Restrictions: No      Mobility  Bed Mobility Overal bed mobility: Needs Assistance Bed Mobility: Supine to Sit     Supine to sit: Min assist     General bed mobility comments: to transition to seated EOB, frequent verbal cueing for sequencing  Transfers Overall transfer level: Needs assistance Equipment used: Rolling walker (2 wheeled) Transfers: Sit to/from [facility] Sit to Stand: Min assist Stand pivot transfers: Min assist       General transfer comment: to transfer to standing with use of RW  Ambulation/Gait Ambulation/Gait assistance: Min assist Gait Distance (Feet): 4 Feet Assistive device: Rolling walker (2 wheeled) Gait Pattern/deviations: Shuffle;Decreased step length - right;Decreased step length - left;Decreased stride length Gait velocity: decreased   General Gait Details: slow, labored steps to chair at bedside; frequent verbal cueing for sequencing  Stairs            Wheelchair Mobility    Modified Rankin (Stroke Patients Only)       Balance Overall balance assessment: Needs assistance Sitting-balance support: Feet supported;No upper extremity supported Sitting balance-Leahy Scale: Fair Sitting balance - Comments: seated EOB   Standing balance support: Bilateral upper extremity supported;No upper extremity supported Standing balance-Leahy Scale: Poor Standing balance comment: using RW                             Pertinent Vitals/Pain Pain Assessment: No/denies pain  Home Living Family/patient expects to be discharged to:: Private residence Living Arrangements: Spouse/significant other Available Help at Discharge: Family Type of Home: House           Additional Comments: Patient is confused and appears to be a poor historian of home set  up/equipment and PLOF    Prior Function           Comments: Patient states household ambulator without AD     Hand Dominance        Extremity/Trunk Assessment   Upper Extremity Assessment Upper Extremity Assessment: Generalized weakness    Lower Extremity Assessment Lower Extremity Assessment: Generalized weakness       Communication      Cognition Arousal/Alertness: Awake/alert;Suspect due to medications Behavior During Therapy: [facility] for tasks assessed/performed;Flat affect Overall Cognitive Status: No family/caregiver present to determine baseline cognitive functioning                                 General Comments: non appropriate responses to questions, poor historian      General Comments      Exercises     Assessment/Plan    PT Assessment Patient needs continued PT services  PT Problem List Decreased strength;Decreased activity tolerance;Decreased balance;Decreased mobility;Decreased knowledge of use of DME;Decreased cognition;Decreased safety awareness       PT Treatment Interventions DME instruction;Therapeutic exercise;Gait training;Balance training;Stair training;Neuromuscular re-education;Functional mobility training;Therapeutic activities;Patient/family education    PT Goals (Current goals can be found in the Care Plan section)  Acute Rehab PT Goals Patient Stated Goal: return home PT Goal Formulation: With patient Time For Goal Achievement: 01/09/20 Potential to Achieve Goals: Fair    Frequency Min 3X/week   Barriers to discharge        Co-evaluation               AM-PAC PT "6 Clicks" Mobility  Outcome Measure Help needed turning from your back to your side while in a flat bed without using bedrails?: None Help needed moving from lying on your back to sitting on the side of a flat bed without using bedrails?: A Little Help needed moving to and from a bed to a chair (including a wheelchair)?: A Lot Help needed  standing up from a chair using your arms (e.g., wheelchair or bedside chair)?: A Little Help needed to walk in hospital room?: A Lot Help needed climbing 3-5 steps with a railing? : A Lot 6 Click Score: 16    End of Session Equipment Utilized During Treatment: Gait belt;Oxygen Activity Tolerance: Patient limited by fatigue;Patient tolerated treatment well Patient left: in chair;with chair alarm set;with nursing/sitter in room;with call bell/phone within reach Nurse Communication: Mobility status PT Visit Diagnosis: Unsteadiness on feet (R26.81);Other abnormalities of gait and mobility (R26.89);Muscle weakness (generalized) (M62.81)    Time: 3976-7341 PT Time Calculation (min) (ACUTE ONLY): 33 min   Charges:   PT Evaluation $PT Eval Moderate Complexity: 1 Mod PT Treatments $Therapeutic Activity: 23-37 mins        3:08 PM, 12/26/19 Wyman Songster PT, DPT Physical Therapist at [facility]

## 2019-12-26 NOTE — Progress Notes (Signed)
Nutrition Follow-up  DOCUMENTATION CODES:      INTERVENTION:  RD will continue to follow for diet advancement and make further recommendations as appropriate   NUTRITION DIAGNOSIS:   Inadequate oral intake related to inability to eat as evidenced by NPO status.  GOAL:   Provide needs based on ASPEN/SCCM guidelines   MONITOR:  Respiratory status, Labs, Weight trends, I & O's, diet advancment  ASSESSMENT:   Patient is a 59 yo male with hx of COPD( Chronic O2), CHF, and recent pneumonia. He presents with short of breath. Failed BiPAP and is now intubated.  2/2-Patient successfully extubated this morning. NPO pending clearance for oral intake. Nasal canula @ 4 liters. Nutrition needs re-assessed with change in status. RD will continue to follow for nutrition needs.  Intake/Output Summary (Last 24 hours) at 12/26/2019 1250 Last data filed at 12/26/2019 0258 Gross per 24 hour  Intake 2120.64 ml  Output 3200 ml  Net -1079.36 ml    Medications reviewed.   Labs: BMP Latest Ref Rng & Units 12/26/2019 12/25/2019 12/25/2019  Glucose 70 - 99 mg/dL 527(P) 824(M) 353(I)  BUN 6 - 20 mg/dL 14(E) 15 12  Creatinine 0.61 - 1.24 mg/dL 3.15 4.00 8.67  Sodium 135 - 145 mmol/L 138 138 137  Potassium 3.5 - 5.1 mmol/L 3.5 3.3(L) 3.7  Chloride 98 - 111 mmol/L 92(L) 95(L) 97(L)  CO2 22 - 32 mmol/L 35(H) 34(H) 33(H)  Calcium 8.9 - 10.3 mg/dL 8.3(L) 8.2(L) 8.2(L)     Diet Order:   Diet Order            Diet NPO time specified  Diet effective now              EDUCATION NEEDS:   No education needs have been identified at this time  Skin:  Skin Assessment: Reviewed RN Assessment(MASD-sacrum)  Last BM:   2/2 type 6  Height:   Ht Readings from Last 1 Encounters:  12/25/19 5\' 10"  (1.778 m)    Weight:   Wt Readings from Last 1 Encounters:  12/26/19 73.5 kg    Ideal Body Weight:   75 kg  BMI:  Body mass index is 23.25 kg/m.  Estimated Nutritional Needs:   Kcal:   2205-2352  Protein: 110-132 gr   Fluid:  >1800 ml daily  05-[birth year]1972 MS,RD,CSG,LDN Office: Royann Shivers Pager: [phone]

## 2019-12-27 LAB — PHOSPHORUS: Phosphorus: 5.6 mg/dL — ABNORMAL HIGH (ref 2.5–4.6)

## 2019-12-27 LAB — CBC
HCT: 35.9 % — ABNORMAL LOW (ref 39.0–52.0)
Hemoglobin: 10.9 g/dL — ABNORMAL LOW (ref 13.0–17.0)
MCH: 28.6 pg (ref 26.0–34.0)
MCHC: 30.4 g/dL (ref 30.0–36.0)
MCV: 94.2 fL (ref 80.0–100.0)
Platelets: 221 10*3/uL (ref 150–400)
RBC: 3.81 MIL/uL — ABNORMAL LOW (ref 4.22–5.81)
RDW: 13.2 % (ref 11.5–15.5)
WBC: 6.8 10*3/uL (ref 4.0–10.5)
nRBC: 0 % (ref 0.0–0.2)

## 2019-12-27 LAB — CULTURE, RESPIRATORY W GRAM STAIN: Culture: NORMAL

## 2019-12-27 LAB — GLUCOSE, CAPILLARY
Glucose-Capillary: 89 mg/dL (ref 70–99)
Glucose-Capillary: 93 mg/dL (ref 70–99)

## 2019-12-27 LAB — BASIC METABOLIC PANEL
Anion gap: 10 (ref 5–15)
BUN: 25 mg/dL — ABNORMAL HIGH (ref 6–20)
CO2: 41 mmol/L — ABNORMAL HIGH (ref 22–32)
Calcium: 8.8 mg/dL — ABNORMAL LOW (ref 8.9–10.3)
Chloride: 94 mmol/L — ABNORMAL LOW (ref 98–111)
Creatinine, Ser: 0.59 mg/dL — ABNORMAL LOW (ref 0.61–1.24)
GFR calc Af Amer: >60 mL/min (ref >60)
GFR calc non Af Amer: >60 mL/min (ref >60)
Glucose, Bld: 99 mg/dL (ref 70–99)
Potassium: 3.9 mmol/L (ref 3.5–5.1)
Sodium: 145 mmol/L (ref 135–145)

## 2019-12-27 LAB — MAGNESIUM: Magnesium: 2.1 mg/dL (ref 1.7–2.4)

## 2019-12-27 MED ORDER — IPRATROPIUM-ALBUTEROL 0.5-2.5 (3) MG/3ML IN SOLN
3.0000 mL | Freq: Three times a day (TID) | RESPIRATORY_TRACT | Status: DC
  Administered 2019-12-27 – 2020-01-10 (×35): 3 mL via RESPIRATORY_TRACT
  Filled 2019-12-27 (×37): qty 3

## 2019-12-27 NOTE — Progress Notes (Signed)
NAME:  Jeffrey [name], MRN:  [id number], DOB:  September 11, 1961, LOS: 3 ADMISSION DATE:  12/24/2019, CONSULTATION DATE:  12/25/2019 REFERRING MD:  Dr. Carles Collet, [facility], CHIEF COMPLAINT:  Short of breath   Brief History   59 yo male smoker brought to ER with altered mental status, tremor and hypoxia.  Has hx of COPD and wears 3 to 4 liters oxygen at home  Reported dyspnea for 3 days prior to admission and had recent treatment for pneumonia.  Found to have hypoxia and hypercapnia.  Started on treated for COPD exacerbation and HCAP.  Failed to improve with Bipap and required intubation.  Past Medical History  COPD, Bipolar, Diastolic CHF, Hepatic encephalopathy, Depression, Polysubstance abuse, [name], ETOH, GI bleed  Significant Hospital Events   1/31 Admit  Consults:    Procedures:  ETT 2/01 >> 2/02  Significant Diagnostic Tests:  PFT 10/09/16 >> FEV1 1.84 (49%), FEV1% 48, TLC 7.39 (105%), DLCO 36% A1AT 10/09/16 >> 215, MM Spirometry 02/02/17 >> FEV1 2.00 (55%), FEV1% 54 CT head 12/24/19 >> normal  Micro Data:  SARS CoV2 PCR 1/31 >> negative Influenza PCR 1/31 >> negative Blood 1/31 >>  Sputum 2/01 >> oral flora  Antimicrobials:  Zosyn 1/31 >>   Interim history/subjective:  Breathing okay.  Weak voice quality.  Objective   Blood pressure 115/78, pulse 95, temperature 98.7 F (37.1 C), temperature source Oral, resp. rate (!) 27, height 5\' 10"  (1.778 m), weight 70.5 kg, SpO2 98 %.        Intake/Output Summary (Last 24 hours) at 12/27/2019 1511 Last data filed at 12/27/2019 0500 Gross per 24 hour  Intake 328.19 ml  Output 600 ml  Net -271.81 ml   Filed Weights   12/24/19 1506 12/26/19 0400 12/27/19 0500  Weight: 69.9 kg 73.5 kg 70.5 kg    Examination:  General - alert Eyes - pupils reactive ENT - no sinus tenderness, no stridor, weak voice Cardiac - regular rate/rhythm, no murmur Chest - equal breath sounds b/l, no wheezing or rales Abdomen - soft, non tender, + bowel  sounds Extremities - no cyanosis, clubbing, or edema Skin - no rashes Neuro - moves extremities, follows commands   Resolved Hospital Problem list   Acute metabolic encephalopathy  Assessment & Plan:   Acute on chronic hypoxic/hypercapnic respiratory failure from AECOPD and aspiration pneumonia. 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 - day 4 of ABx - wean off steroids as able - scheduled BDs  Hx of bipolar with depression. - continue prozac, zyprexa, valproic acid  Hypokalemia. Hypophosphatemia. - f/u labs  Anemia of critical illness and chronic disease. - f/u CBC - transfuse for Hb < 7 or significant bleeding  Chronic diastolic CHF, [name]. - continue ASA, lopressor, lipitor - lasix x one today   Best practice:  Diet: tube feeds DVT prophylaxis: lovenox GI prophylaxis: protonix Mobility: bed rest Code Status: full code Disposition: transfer to floor bed   PCCM will sign off.  Please call if additional help needed while he is in ICU.  Labs    CMP Latest Ref Rng & Units 12/27/2019 12/26/2019 12/25/2019  Glucose 70 - 99 mg/dL 99 145(H) 139(H)  BUN 6 - 20 mg/dL 25(H) 22(H) 15  Creatinine 0.61 - 1.24 mg/dL 0.59(L) 0.64 0.61  Sodium 135 - 145 mmol/L 145 138 138  Potassium 3.5 - 5.1 mmol/L 3.9 3.5 3.3(L)  Chloride 98 - 111 mmol/L 94(L) 92(L) 95(L)  CO2 22 - 32 mmol/L 41(H) 35(H) 34(H)  Calcium 8.9 - 10.3 mg/dL 4.8(N) 8.3(L) 8.2(L)  Total Protein 6.5 - 8.1 g/dL - - 5.2(L)  Total Bilirubin 0.3 - 1.2 mg/dL - - 0.6  Alkaline Phos 38 - 126 U/L - - 58  AST 15 - 41 U/L - - 19  ALT 0 - 44 U/L - - 14   CBC Latest Ref Rng & Units 12/27/2019 12/26/2019 12/26/2019  WBC 4.0 - 10.5 K/uL 6.8 9.8 6.4  Hemoglobin 13.0 - 17.0 g/dL 10.9(L) 11.6(L) 10.4(L)  Hematocrit 39.0 - 52.0 % 35.9(L) 37.3(L) 33.2(L)  Platelets 150 - 400 K/uL 221 254 206   ABG    Component Value Date/Time   PHART 7.543 (H) 12/25/2019 0428   PCO2ART 43.1 12/25/2019 0428   PO2ART 107 12/25/2019  0428   HCO3 36.9 (H) 12/25/2019 0428   TCO2 34 (H) 12/21/2018 0357   ACIDBASEDEF 0.6 02/17/2019 1850   O2SAT 98.8 12/25/2019 0428   CBG (last 3)  Recent Labs    12/26/19 1132 12/27/19 0729 12/27/19 1134  GLUCAP 152* 89 93     Coralyn Helling, MD [facility] 12/27/2019, 3:11 PM

## 2019-12-27 NOTE — Progress Notes (Signed)
p Pharmacy Antibiotic Note  Jeffrey [name] is a 59 y.o. male admitted on 12/24/2019 with pneumonia.  Pharmacy has been consulted for zosyn dosing.  Plan: Zosyn 3.375gm iv q8h  Monitor labs, c/s, and de-escalation.  Height: 5\' 10"  (177.8 cm) Weight: 155 lb 6.8 oz (70.5 kg) IBW/kg (Calculated) : 73  Temp (24hrs), Avg:98.7 F (37.1 C), Min:97.8 F (36.6 C), Max:99.2 F (37.3 C)  Recent Labs  Lab 12/24/19 1703 12/24/19 1703 12/24/19 1830 12/24/19 2202 12/25/19 0209 12/25/19 0841 12/26/19 0403 12/26/19 1648 12/27/19 0621  WBC 6.7   < >  --  4.2 4.2  --  6.4 9.8 6.8  CREATININE 0.50*  --   --   --  0.67 0.61 0.64  --  0.59*  LATICACIDVEN 1.7  --  3.2* 3.6* 3.6*  --   --   --   --    < > = values in this interval not displayed.    Estimated Creatinine Clearance: 99.1 mL/min (A) (by C-G formula based on SCr of 0.59 mg/dL (L)).    Allergies  Allergen Reactions  . Augmentin [Amoxicillin-Pot Clavulanate] Rash and Other (See Comments)    Has patient had a PCN reaction causing immediate rash, facial/tongue/throat swelling, SOB or lightheadedness with hypotension: No Has patient had a PCN reaction causing severe rash involving mucus membranes or skin necrosis: No Has patient had a PCN reaction that required hospitalization No Has patient had a PCN reaction occurring within the last 10 years: Yes If all of the above answers are "NO", then may proceed with Cephalosporin use.  . Ace Inhibitors Hives    Antimicrobials this admission: Vancomycin 1/31 >> 2/1 1/31 cefepime x 1  zosyn 1/31>>   Microbiology results: 1/31 MRSA PCR:neg 1/31 Covid: negative  1/31 BCx: ngtd  Thank you for allowing pharmacy to be a part of this patient's care.  2/31 12/27/2019 10:40 AM

## 2019-12-27 NOTE — Progress Notes (Signed)
PROGRESS NOTE    Jeffrey [name]  TKZ:[id number] DOB: 1960-12-23 DOA: 12/24/2019 PCP: Gareth Morgan, MD    Brief Narrative:  This is a 59 year old man with severe COPD who is usually on 3 to 4 L/min of nasal cannula at home and who has history of diastolic congestive heart failure was admitted 3 days ago with dyspnea and confusion.  He was intubated and mechanically ventilated fairly quickly in the emergency room.  Thankfully, he was able to be extubated yesterday successfully. He is currently having a nebulized treatment and looks weak but says he feels reasonably okay.  No specific complaints at the present time. He has been treated appropriately with antibiotics, steroids and bronchodilators.  Assessment & Plan:   Active Problems:   Bipolar disorder with severe depression (HCC)   Essential hypertension   COPD with acute exacerbation (HCC)   Acute on chronic respiratory failure with hypercapnia (HCC)   Acute on chronic respiratory failure with hypoxia and hypercapnia (HCC)   Lobar pneumonia (HCC)   Acute metabolic encephalopathy   Acute on chronic diastolic CHF (congestive heart failure) (HCC)   Aspiration pneumonitis (HCC)   1.  Acute on chronic respiratory failure with hypoxia and hypercarbia .  This is from his severe COPD.  Now extubated.  Continue with steroids, antibiotics and bronchodilators.  Pulmonary following. 2.  Acute on chronic diastolic congestive heart failure.  Continue with IV Lasix. 3.  Possible aspiration pneumonitis.  Continue with Zosyn. 4.  Acute metabolic encephalopathy.  This seems to be improving.  CT brain scan negative. 5.  Hypertension.  Receiving intravenous metoprolol, small doses but monitor blood pressure closely as blood pressures tends to be soft. 6.  Bipolar disorder.  Continue with psychoactive medications.   DVT prophylaxis: Lovenox Code Status: Full code.  Disposition Plan: Patient was admitted from home.  Physical therapy recommends  skilled nursing facility upon discharge.  I would like to see further stabilization of his respiratory status, appears weak at the present time.  I would like to reduce and taper steroids and get him back to consistent baseline oxygen of 3 to 4 L/min before discharge.   Consultants:   Pulmonary.  Procedures:   Intubated and mechanically ventilated, now extubated.  Antimicrobials:   Zosyn started December 24, 2019 until present.  Vancomycin discontinued December 25, 2019.   Subjective: Having nebulized treatment.  Shakes my hand and indicates that he feels okay.  Objective: Vitals:   12/27/19 0500 12/27/19 0600 12/27/19 0700 12/27/19 0730  BP: 98/60 109/69 96/61   Pulse: 97 95 90 97  Resp: (!) 23 (!) 23 (!) 22 (!) 27  Temp:    98.7 F (37.1 C)  TempSrc:    Oral  SpO2: 98% 100% 98% 99%  Weight: 70.5 kg     Height:        Intake/Output Summary (Last 24 hours) at 12/27/2019 0806 Last data filed at 12/27/2019 0500 Gross per 24 hour  Intake 592.4 ml  Output 600 ml  Net -7.6 ml   Filed Weights   12/24/19 1506 12/26/19 0400 12/27/19 0500  Weight: 69.9 kg 73.5 kg 70.5 kg    Examination:   Appears to have some respiratory distress at rest but this may be his baseline.  Saturations are adequate on nebulized treatment at the present time.  Lung fields show poor air entry bilaterally.  Few crackles both bases.  Alert.  Data Reviewed: I have personally reviewed following labs and imaging studies  CBC: Recent Labs  Lab 12/24/19 1703 12/24/19 1703 12/24/19 2202 12/25/19 0209 12/26/19 0403 12/26/19 1648 12/27/19 0621  WBC 6.7   < > 4.2 4.2 6.4 9.8 6.8  NEUTROABS 6.4  --   --   --   --   --   --   HGB 11.7*   < > 11.1* 9.7* 10.4* 11.6* 10.9*  HCT 39.6   < > 37.2* 31.6* 33.2* 37.3* 35.9*  MCV 96.4   < > 96.9 95.2 92.7 92.8 94.2  PLT 226   < > 208 192 206 254 221   < > = values in this interval not displayed.   Basic Metabolic Panel: Recent Labs  Lab 12/24/19 1703  12/24/19 1830 12/24/19 2202 12/25/19 0209 12/25/19 0841 12/25/19 1311 12/25/19 1655 12/26/19 0403 12/27/19 0621  NA 142  --   --  137 138  --   --  138 145  K 4.5  --   --  3.7 3.3*  --   --  3.5 3.9  CL 86*  --   --  97* 95*  --   --  92* 94*  CO2 42*  --   --  33* 34*  --   --  35* 41*  GLUCOSE 121*  --   --  134* 139*  --   --  145* 99  BUN 10  --   --  12 15  --   --  22* 25*  CREATININE 0.50*  --   --  0.67 0.61  --   --  0.64 0.59*  CALCIUM 9.4  --   --  8.2* 8.2*  --   --  8.3* 8.8*  MG  --    < >   < >  --  2.1 2.1 2.0 2.0 2.1  PHOS 2.5   < >   < >  --  2.1* 2.9 3.4 3.0 5.6*   < > = values in this interval not displayed.   GFR: Estimated Creatinine Clearance: 99.1 mL/min (A) (by C-G formula based on SCr of 0.59 mg/dL (L)). Liver Function Tests: Recent Labs  Lab 12/24/19 1703 12/25/19 0841  AST 26 19  ALT 19 14  ALKPHOS 82 58  BILITOT 0.6 0.6  PROT 7.1 5.2*  ALBUMIN 3.4* 2.5*   No results for input(s): LIPASE, AMYLASE in the last 168 hours. Recent Labs  Lab 12/24/19 1703 12/25/19 0841  AMMONIA 56* 30   Coagulation Profile: No results for input(s): INR, PROTIME in the last 168 hours. Cardiac Enzymes: No results for input(s): CKTOTAL, CKMB, CKMBINDEX, TROPONINI in the last 168 hours. BNP (last 3 results) No results for input(s): PROBNP in the last 8760 hours. HbA1C: No results for input(s): HGBA1C in the last 72 hours. CBG: Recent Labs  Lab 12/26/19 0023 12/26/19 0416 12/26/19 0726 12/26/19 1132 12/27/19 0729  GLUCAP 152* 140* 109* 152* 89   Lipid Profile: Recent Labs    12/24/19 1734  TRIG 84   Thyroid Function Tests: No results for input(s): TSH, T4TOTAL, FREET4, T3FREE, THYROIDAB in the last 72 hours. Anemia Panel: No results for input(s): VITAMINB12, FOLATE, FERRITIN, TIBC, IRON, RETICCTPCT in the last 72 hours. Sepsis Labs: Recent Labs  Lab 12/24/19 1703 12/24/19 1830 12/24/19 2202 12/25/19 0209 12/25/19 0841  PROCALCITON  --    --   --   --  <0.10  LATICACIDVEN 1.7 3.2* 3.6* 3.6*  --     Recent Results (from the past 240 hour(s))  Respiratory Panel by RT  PCR (Flu A&B, Covid) - Nasopharyngeal Swab     Status: None   Collection Time: 12/24/19  3:38 PM   Specimen: Nasopharyngeal Swab  Result Value Ref Range Status   SARS Coronavirus 2 by RT PCR NEGATIVE NEGATIVE Final    Comment: (NOTE) SARS-CoV-2 target nucleic acids are NOT DETECTED. The SARS-CoV-2 RNA is generally detectable in upper respiratoy specimens during the acute phase of infection. The lowest concentration of SARS-CoV-2 viral copies this assay can detect is 131 copies/mL. A negative result does not preclude SARS-Cov-2 infection and should not be used as the sole basis for treatment or other patient management decisions. A negative result may occur with  improper specimen collection/handling, submission of specimen other than nasopharyngeal swab, presence of viral mutation(s) within the areas targeted by this assay, and inadequate number of viral copies (<131 copies/mL). A negative result must be combined with clinical observations, patient history, and epidemiological information. The expected result is Negative. Fact Sheet for Patients:  https://www.moore.com/ Fact Sheet for Healthcare Providers:  https://www.young.biz/ This test is not yet ap proved or cleared by the [place] FDA and  has been authorized for detection and/or diagnosis of SARS-CoV-2 by FDA under an Emergency Use Authorization (EUA). This EUA will remain  in effect (meaning this test can be used) for the duration of the COVID-19 declaration under Section 564(b)(1) of the Act, 21 U.S.C. section 360bbb-3(b)(1), unless the authorization is terminated or revoked sooner.    Influenza A by PCR NEGATIVE NEGATIVE Final   Influenza B by PCR NEGATIVE NEGATIVE Final    Comment: (NOTE) The Xpert Xpress SARS-CoV-2/FLU/RSV assay is intended as an  aid in  the diagnosis of influenza from Nasopharyngeal swab specimens and  should not be used as a sole basis for treatment. Nasal washings and  aspirates are unacceptable for Xpert Xpress SARS-CoV-2/FLU/RSV  testing. Fact Sheet for Patients: https://www.moore.com/ Fact Sheet for Healthcare Providers: https://www.young.biz/ This test is not yet approved or cleared by the [place] FDA and  has been authorized for detection and/or diagnosis of SARS-CoV-2 by  FDA under an Emergency Use Authorization (EUA). This EUA will remain  in effect (meaning this test can be used) for the duration of the  Covid-19 declaration under Section 564(b)(1) of the Act, 21  U.S.C. section 360bbb-3(b)(1), unless the authorization is  terminated or revoked. Performed at [facility], [address], [place], [place] [address]   Blood culture (routine x 2)     Status: None (Preliminary result)   Collection Time: 12/24/19  5:04 PM   Specimen: BLOOD LEFT HAND  Result Value Ref Range Status   Specimen Description BLOOD LEFT HAND  Final   Special Requests   Final    BOTTLES DRAWN AEROBIC AND ANAEROBIC Blood Culture adequate volume   Culture   Final    NO GROWTH 3 DAYS Performed at [facility], [address], [place], [place] [address]    Report Status PENDING  Incomplete  Blood culture (routine x 2)     Status: None (Preliminary result)   Collection Time: 12/24/19  5:05 PM   Specimen: BLOOD LEFT HAND  Result Value Ref Range Status   Specimen Description BLOOD LEFT HAND  Final   Special Requests   Final    BOTTLES DRAWN AEROBIC AND ANAEROBIC Blood Culture adequate volume   Culture   Final    NO GROWTH 3 DAYS Performed at [facility], [address], [place], [place] [address]    Report Status PENDING  Incomplete  MRSA PCR Screening     Status: None   Collection Time: 12/24/19  8:59 PM   Specimen: Nasopharyngeal  Result Value Ref Range Status   MRSA by PCR  NEGATIVE NEGATIVE Final    Comment:        The GeneXpert MRSA Assay (FDA approved for NASAL specimens only), is one component of a comprehensive MRSA colonization surveillance program. It is not intended to diagnose MRSA infection nor to guide or monitor treatment for MRSA infections. Performed at [facility], [address] [place], [place] [address]   Culture, respiratory (non-expectorated)     Status: None (Preliminary result)   Collection Time: 12/25/19  8:21 AM   Specimen: Tracheal Aspirate; Respiratory  Result Value Ref Range Status   Specimen Description   Final    TRACHEAL ASPIRATE COLLECTED BY RT Performed at [facility], [address], [place], [place] [address]    Special Requests   Final    NONE Performed at [facility], [address] [place], [place] [address]    Gram Stain   Final    FEW WBC PRESENT, PREDOMINANTLY PMN NO ORGANISMS SEEN    Culture   Final    NO GROWTH < 24 HOURS Performed at [facility], [address], [place], [place] [address]    Report Status PENDING  Incomplete  MRSA PCR Screening     Status: None   Collection Time: 12/25/19  9:11 AM   Specimen: Nasal Mucosa; Nasopharyngeal  Result Value Ref Range Status   MRSA by PCR NEGATIVE NEGATIVE Final    Comment:        The GeneXpert MRSA Assay (FDA approved for NASAL specimens only), is one component of a comprehensive MRSA colonization surveillance program. It is not intended to diagnose MRSA infection nor to guide or monitor treatment for MRSA infections. Performed at [facility], [address], [place], [place] [address]   Culture, Urine     Status: None   Collection Time: 12/25/19  9:12 AM   Specimen: Urine, Clean Catch  Result Value Ref Range Status   Specimen Description   Final    URINE, CLEAN CATCH Performed at [facility], [address], [place], [place] [address]    Special Requests   Final    NONE Performed at [facility], [address] [place],  [place] [address]    Culture   Final    NO GROWTH Performed at [facility], [address] [place], [place] [address]    Report Status 12/26/2019 FINAL  Final         Radiology Studies: DG Chest Port 1 View  Result Date: 12/26/2019 CLINICAL DATA:  Respiratory failure. EXAM: PORTABLE CHEST 1 VIEW COMPARISON:  12/24/2019 FINDINGS: The endotracheal tube is 4 cm above the carina. The NG tube is coursing down the esophagus and into the stomach. Underlying chronic emphysematous changes and pulmonary scarring. Small bilateral pleural effusions and overlying atelectasis along with probable some loculated fluid in the major fissure on the right. No definite infiltrates. IMPRESSION: 1. Endotracheal tube and NG tubes in good position. 2. Chronic underlying lung disease/emphysema. 3. Small bilateral pleural effusions and overlying atelectasis. Electronically Signed   By: Rudie Meyer M.D.   On: 12/26/2019 05:17        Scheduled Meds: . aspirin EC  81 mg Oral QPM  . atorvastatin  20 mg Oral QPM  . budesonide (PULMICORT) nebulizer solution  0.5 mg Nebulization BID  .  Chlorhexidine Gluconate Cloth  6 each Topical Daily  . enoxaparin (LOVENOX) injection  40 mg Subcutaneous Q24H  . FLUoxetine  40 mg Oral Daily  . folic acid  1 mg Oral Daily  . furosemide  40 mg Intravenous Daily  . ipratropium-albuterol  3 mL Nebulization TID  . mouth rinse  15 mL Mouth Rinse BID  . methylPREDNISolone (SOLU-MEDROL) injection  60 mg Intravenous Q12H  . metoprolol tartrate  2.5 mg Intravenous Q6H  . multivitamin with minerals  1 tablet Oral Daily  . OLANZapine  10 mg Oral QHS  . pantoprazole  40 mg Oral Daily  . thiamine  100 mg Oral Daily   Or  . thiamine  100 mg Intravenous Daily  . valproic acid  500 mg Oral TID   Continuous Infusions: . lactated ringers 10 mL/hr at 12/25/19 1208  . piperacillin-tazobactam (ZOSYN)  IV 3.375 g (12/27/19 0514)  . sodium phosphate  Dextrose 5% IVPB       LOS: 3 days        Sondi Desch Normajean Glasgow, MD [facility]   If 7PM-7AM, please contact night-coverage [email or web address]  12/27/2019, 8:06 AM

## 2019-12-27 NOTE — TOC Initial Note (Signed)
Transition of Care [facility]) - Initial/Assessment Note    Patient Details  Name: Jeffrey [name] MRN: [id number] Date of Birth: Oct 30, 1961  Transition of [facility]) CM/SW Contact:    Annice Needy, LCSW Phone Number: 12/27/2019, 5:10 PM  Clinical Narrative:                 From home with ex-wife. Has home oxygen. Smokes. Dtr. Vanna Sailer provided hx. Patient previously had been living with dtr. And son in law and about five months ago moved back in with his ex-wife. Discussed PT/ST recommendations. [name] would like for patient to go to short term rehab and discharge back to her home. She states that patient would have to make his own decision about rehab.  Attempted contact with patient via room phone. Per nurse, patient held phone up to his ear but would not discussed discharge plan. RN advised that patient had been talking earlier in the day.  TOC will follow up with patient about SNFvs HH.  Expected Discharge Plan: Skilled Nursing Facility Barriers to Discharge: Continued Medical Work up   Patient Goals and CMS Choice     Choice offered to / list presented to : Patient  Expected Discharge Plan and Services Expected Discharge Plan: Skilled Nursing Facility       Living arrangements for the past 2 months: Single Family Home                                      Prior Living Arrangements/Services Living arrangements for the past 2 months: Single Family Home Lives with:: Significant Other(lives with ex-wie) Patient language and need for interpreter reviewed:: Yes        Need for Family Participation in Patient Care: Yes (Comment) Care giver support system in place?: Yes (comment) Current home services: DME Criminal Activity/Legal Involvement Pertinent to Current Situation/Hospitalization: No - Comment as needed  Activities of Daily Living      Permission Sought/Granted Permission sought to share information with : Family Supports          Permission granted to  share info w Relationship: dtr. Malena Timpone     Emotional Assessment Appearance:: Appears stated age Attitude/Demeanor/Rapport: (would not engage) Affect (typically observed): Unable to Assess Orientation: : Oriented to Self, Oriented to Place, Oriented to Situation Alcohol / Substance Use: Tobacco Use Psych Involvement: No (comment)  Admission diagnosis:  Encephalopathy [G93.40] History of ETT [Z92.89] Acute on chronic respiratory failure with hypercapnia (HCC) [J96.22] Acute respiratory failure with hypoxia and hypercapnia (HCC) [J96.01, J96.02] Patient Active Problem List   Diagnosis Date Noted  . Acute on chronic diastolic CHF (congestive heart failure) (HCC) 12/26/2019  . Aspiration pneumonitis (HCC) 12/26/2019  . Acute on chronic respiratory failure with hypoxia and hypercapnia (HCC) 12/25/2019  . Lobar pneumonia (HCC) 12/25/2019  . Acute metabolic encephalopathy 12/25/2019  . Acute on chronic respiratory failure with hypercapnia (HCC) 12/24/2019  . COPD with acute exacerbation (HCC) 10/12/2019  . Respiratory failure with hypoxia (HCC) 04/30/2019  . Acute on chronic respiratory failure with hypoxia (HCC) 04/30/2019  . Bronchiectasis (HCC) 03/24/2019  . Fever 03/24/2019  . Cough   . Dysphagia   . Palliative care by specialist   . Goals of care, counseling/discussion   . Hepatic encephalopathy (HCC)   . COPD exacerbation (HCC) 09/16/2018  . Essential hypertension 09/16/2018  . GERD (gastroesophageal reflux disease) 09/16/2018  . Hypoxia   . Chronic  diastolic CHF (congestive heart failure) (HCC)   . Bipolar disorder with severe depression (HCC) 02/01/2018  . Alcohol use disorder, severe, dependence (HCC) 02/01/2018  . Cannabis use disorder, severe, dependence (HCC) 02/01/2018  . MDD (major depressive disorder), recurrent episode, severe (HCC) 01/31/2018  . Internal and external bleeding hemorrhoids   . Rectal bleeding   . Iron deficiency anemia due to chronic blood loss  03/09/2017  . Anemia   . GIB (gastrointestinal bleeding) 02/07/2017  . Peptic ulcer disease   . Bipolar disorder (HCC)   . 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 H/O: depression 08/26/2016  . Dyslipidemia 08/26/2016  . Tobacco use disorder 08/26/2016  . [name] (peripheral vascular disease) (HCC) 06/18/2015  . Chronic diarrhea 05/02/2013  . Hematochezia 05/02/2013  . Abnormal weight loss 05/02/2013  . Abdominal pain, periumbilical 05/02/2013   PCP:  Gareth Morgan, MD Pharmacy:   [facility] [place] [place] - [address] [place] [address] Phone: [phone] Fax: [phone]     Social Determinants of Health (SDOH) Interventions    Readmission Risk Interventions Readmission Risk Prevention Plan 05/01/2019 03/30/2019 03/28/2019  Transportation Screening Complete - Complete  Medication Review (RN Care Manager) - - Complete  PCP or Specialist appointment within 3-5 days of discharge - Not Complete -  PCP/Specialist Appt Not Complete comments - recommended for 1 week f/u, PCP closed today, patient to call tomorrow and make appt.  -  HRI or Home Care Consult Complete - Complete  SW Recovery Care/Counseling Consult Complete - Complete  Palliative Care Screening Not Applicable - Not Applicable  Skilled Nursing Facility Not Applicable - Not Applicable  Some recent data might be hidden

## 2019-12-27 NOTE — Progress Notes (Signed)
OT Cancellation Note  Patient Details Name: Jeffrey [name] MRN: [id number] DOB: 1960-12-13   Cancelled Treatment:    Reason Eval/Treat Not Completed: Fatigue/lethargy limiting ability to participate. Pt easily awakened to name this am. Lethargic but answering questions. Pt coughing with max effort, RR increasing to mid 30s when coughing, decreasing with rest immediately after. After multiple coughing episodes pt closed eyes, very fatigued. OT asked if pt would prefer she return at a later time to which pt nodded head. Will check back later when pt better able to participate in evaluation.   Ezra Sites, OTR/L  [phone] 12/27/2019, 8:04 AM

## 2019-12-27 NOTE — Progress Notes (Signed)
Physical Therapy Treatment Patient Details Name: Jeffrey [name] MRN: [id number] DOB: Feb 25, 1961 Today's Date: 12/27/2019    History of Present Illness Jeffrey [name] is a 60 y.o. male with medical history significant for COPD with chronic respiratory failure, bipolar disorder, diastolic CHF, hepatic encephalopathy, depression. History is obtained from chart review and talking to patient's ex-wife.  Per chart review patient was brought into the ED reports of difficulty breathing over the past 3 days, with confusion, and in the ED patient was tremulous. Patient's ex-wife who now lives with patient tells me patient has not had any drink or use any drugs in the past year.  She reports increasing difficulty breathing and productive cough.  No vomiting no loose stools.  She said the past 3 days when patient was confused tells when he said he wanted someone to shoot him.  She otherwise denies suicidal ideations.  She also reports patient has gotten tired of using oxygen, his bronchodilators, and lying around as normally he likes to be active.    PT Comments    Pt non-verbal though did nod head agreeing to participate with therpay.  Pt impulsive and required constant cueing for safety though session.  Cueing for safety with hand and LE placement prior sit to stand, used RW upon standing to assist with balance, some posterior lean but was able to resolve following cueing.  Limited by fatigue following 6 ft of gait.  It appeared as though pt had removed oxygen saturation from finger, RN aware and replaced.  O2 saturation stayed in 90, slight decreased oxygen during coughs but did return quickly to WNL.  EOS pt left in chair with call bell with in reach, chair alarm set and RN/NT aware of status.   Follow Up Recommendations  SNF     Equipment Recommendations  None recommended by PT    Recommendations for Other Services       Precautions / Restrictions Precautions Precautions:  Fall Restrictions Weight Bearing Restrictions: No    Mobility  Bed Mobility               General bed mobility comments: pt sitting in chair upon therapist entrance  Transfers Overall transfer level: Modified independent Equipment used: Rolling walker (2 wheeled) Transfers: Sit to/from Stand Sit to Stand: Min assist         General transfer comment: multimodal cueing for proper hand placement for safety upon standing to RW  Ambulation/Gait Ambulation/Gait assistance: Min assist Gait Distance (Feet): 6 Feet Assistive device: Rolling walker (2 wheeled) Gait Pattern/deviations: Shuffle;Decreased step length - right;Decreased step length - left;Decreased stride length Gait velocity: decreased   General Gait Details: slow, labored steps to chair at bedside; frequent verbal cueing for sequencing   Stairs             Wheelchair Mobility    Modified Rankin (Stroke Patients Only)       Balance                                            Cognition Arousal/Alertness: Awake/alert Behavior During Therapy: WFL for tasks assessed/performed;Anxious;Impulsive Overall Cognitive Status: No family/caregiver present to determine baseline cognitive functioning                                 General Comments: pt non-verbal,  just mouthed words with no sound, difficult to understanding.  Pt anxious and impulsive.  Poor historian per eval      Exercises      General Comments        Pertinent Vitals/Pain Pain Assessment: No/denies pain    Home Living                      Prior Function            PT Goals (current goals can now be found in the care plan section)      Frequency    Min 3X/week      PT Plan      Co-evaluation              AM-PAC PT "6 Clicks" Mobility   Outcome Measure  Help needed turning from your back to your side while in a flat bed without using bedrails?: None Help needed moving  from lying on your back to sitting on the side of a flat bed without using bedrails?: A Little Help needed moving to and from a bed to a chair (including a wheelchair)?: A Little Help needed standing up from a chair using your arms (e.g., wheelchair or bedside chair)?: A Little Help needed to walk in hospital room?: A Lot Help needed climbing 3-5 steps with a railing? : A Lot 6 Click Score: 17    End of Session Equipment Utilized During Treatment: Gait belt;Oxygen Activity Tolerance: Patient limited by fatigue;Patient tolerated treatment well Patient left: in chair;with chair alarm set;with nursing/sitter in room;with call bell/phone within reach Nurse Communication: Mobility status PT Visit Diagnosis: Unsteadiness on feet (R26.81);Other abnormalities of gait and mobility (R26.89);Muscle weakness (generalized) (M62.81)     Time: 2409-7353 PT Time Calculation (min) (ACUTE ONLY): 24 min  Charges:  $Therapeutic Activity: 23-37 mins                     [address], LPTA; CBIS [phone]   Aldona Lento 12/27/2019, 12:26 PM

## 2019-12-27 NOTE — Evaluation (Signed)
Clinical/Bedside Swallow Evaluation Patient Details  Name: Jeffrey [name] MRN: [id number] Date of Birth: May 23, 1961  Today's Date: 12/27/2019 Time: SLP Start Time (ACUTE ONLY): 1352 SLP Stop Time (ACUTE ONLY): 1416 SLP Time Calculation (min) (ACUTE ONLY): 24 min  Past Medical History:  Past Medical History:  Diagnosis Date  . Acute blood loss anemia 02/07/2017  . Anxiety   . Arthritis    deg disease, bulging disk,  shoulder level  . Bipolar disorder (HCC)   . Bipolar disorder (HCC)   . CHF (congestive heart failure) (HCC)   . 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 Depression    anxiety  . Hyperlipidemia   . Hypertension   . Peptic ulcer disease    Review  . Pneumonia   . [name] (peripheral vascular disease) (HCC) 06/18/2015  . Shortness of breath    Past Surgical History:  Past Surgical History:  Procedure Laterality Date  . BIOPSY  02/09/2017   Procedure: BIOPSY;  Surgeon: [name], MD;  Location: [facility];  Service: Endoscopy;;  duodenal gastric  . COLONOSCOPY  03/14/2007   NWG:NFAOZH colonoscopy and terminal ileoscopy except external hemorrhoids  . COLONOSCOPY N/A 05/05/2013   [name]: external/internal anal canal hemorrhoids, unable to intubate TI, segemental biopsies unremarkable   . COLONOSCOPY N/A 04/11/2017   Procedure: COLONOSCOPY;  Surgeon: [name], MD;  Location: [facility];  Service: Endoscopy;  Laterality: N/A;  . COLONOSCOPY WITH PROPOFOL N/A 03/31/2017   Procedure: COLONOSCOPY WITH PROPOFOL;  Surgeon: [name], MD;  Location: [facility];  Service: Endoscopy;  Laterality: N/A;  1:45pm  . ESOPHAGOGASTRODUODENOSCOPY  03/14/2007   YQM:VHQIONGEXB antral gastritis with bulbar duodenitis/paucity to postbulbar duodenal folds and biopsy were benign with no evidence of villous atrophy.  . ESOPHAGOGASTRODUODENOSCOPY (EGD) WITH PROPOFOL N/A 02/09/2017   Procedure: ESOPHAGOGASTRODUODENOSCOPY (EGD) WITH PROPOFOL;  Surgeon: [name], MD;  Location:  [facility];  Service: Endoscopy;  Laterality: N/A;  . GIVENS CAPSULE STUDY N/A 04/08/2017   Procedure: GIVENS CAPSULE STUDY;  Surgeon: [name], MD;  Location: [facility];  Service: Endoscopy;  Laterality: N/A;  . HAND SURGERY     left, secondary to self-inflicted laceration  . HEMORRHOID SURGERY N/A 04/14/2017   Procedure: HEMORRHOIDECTOMY;  Surgeon: [name], MD;  Location: [facility];  Service: General;  Laterality: N/A;  . SHOULDER SURGERY     right  . TOE SURGERY     left great toe , amputated-lawnmover accident   HPI:  This is a 58 year old man with severe COPD who is usually on 3 to 4 L/min of nasal cannula at home and who has history of diastolic congestive heart failure was admitted 12/24/19 with dyspnea and confusion.  He was intubated and mechanically ventilated fairly quickly in the emergency room. He was extubated 12/26/19. Acute on chronic respiratory failure with hypoxia and hypercarbia .  This is from his severe COPD.  Now extubated.  Continue with steroids, antibiotics and bronchodilators.  Pulmonary following. Pt was seen for MBSS 02/27/20 during acute stay at [facility] and a mechanical soft diet with thin liquids was recommended (Pt refused puree/NTL prior to MBSS). BSE ordered   Assessment / Plan / Recommendation Clinical Impression  Pt primarily presents with suspected cognitive based dysphagia, however his dysphonia is concerning for reduced airway protection. Pt was seen in acute setting at [facility] in April after prolonged intubation (was placed on puree/NTL and refused and then MBSS completed and Pt placed  on D3/thin during that stay). Pt currently only mouthing words to SLP (not even a whisper). Some vocalizations observed when Pt coughing after trials of thin liquids, however unable to sustain voicing in cued tasks. Pt consumed puree without incident. He required mod/max cues to suck water from the straw which occasionally resulting in immediate and delayed  coughing. Recommend NPO except for po medications whole in puree and sips of water/ice chips after oral care. Pt also recently had Ativan per nursing. SLP will follow tomorrow AM.   SLP Visit Diagnosis: Dysphagia, unspecified (R13.10)    Aspiration Risk  Moderate aspiration risk    Diet Recommendation NPO except meds;Ice chips PRN after oral care;Free water protocol after oral care   Medication Administration: Whole meds with puree Postural Changes: Seated upright at 90 degrees;Remain upright for at least 30 minutes after po intake    Other  Recommendations Oral Care Recommendations: Oral care prior to ice chip/H20;Staff/trained caregiver to provide oral care   Follow up Recommendations (pending)      Frequency and Duration min 2x/week  1 week       Prognosis Prognosis for Safe Diet Advancement: Fair Barriers to Reach Goals: Cognitive deficits      Swallow Study   General Date of Onset: 12/24/19 HPI: This is a 59 year old man with severe COPD who is usually on 3 to 4 L/min of nasal cannula at home and who has history of diastolic congestive heart failure was admitted 12/24/19 with dyspnea and confusion.  He was intubated and mechanically ventilated fairly quickly in the emergency room. He was extubated 12/26/19. Acute on chronic respiratory failure with hypoxia and hypercarbia .  This is from his severe COPD.  Now extubated.  Continue with steroids, antibiotics and bronchodilators.  Pulmonary following. Pt was seen for MBSS 02/27/20 during acute stay at [facility] and a mechanical soft diet with thin liquids was recommended (Pt refused puree/NTL prior to MBSS). BSE ordered Type of Study: Bedside Swallow Evaluation Previous Swallow Assessment: MBSS February 27, 2019 D3/thin  Diet Prior to this Study: NPO Temperature Spikes Noted: No Respiratory Status: Nasal cannula History of Recent Intubation: Yes Length of Intubations (days): 3 days Date extubated: 12/26/19 Behavior/Cognition:  Alert;Requires cueing Oral Cavity Assessment: Within Functional Limits Oral Care Completed by SLP: Yes Oral Cavity - Dentition: Dentures, top Vision: Functional for self-feeding Self-Feeding Abilities: (Pt demonstrating cognitive deficits currently) Patient Positioning: Upright in bed Baseline Vocal Quality: Not observed;Aphonic(RN reports that Pt's family says this his baseline) Volitional Cough: Cognitively unable to elicit Volitional Swallow: Unable to elicit    Oral/Motor/Sensory Function Overall Oral Motor/Sensory Function: Within functional limits   Ice Chips Ice chips: Within functional limits Presentation: Spoon;Cup   Thin Liquid Thin Liquid: Impaired Presentation: Cup;Spoon Oral Phase Impairments: Reduced labial seal;Reduced lingual movement/coordination;Poor awareness of bolus Oral Phase Functional Implications: Left anterior spillage;Oral holding Pharyngeal  Phase Impairments: Suspected delayed Swallow;Cough - Immediate;Cough - Delayed    Nectar Thick Nectar Thick Liquid: Not tested   Honey Thick Honey Thick Liquid: Not tested   Puree Puree: Within functional limits Presentation: Spoon   Solid     Solid: Impaired Presentation: Spoon Oral Phase Impairments: Reduced lingual movement/coordination     Thank you,  Havery Moros, CCC-SLP [phone]  Ari Bernabei 12/27/2019,2:23 PM

## 2019-12-28 ENCOUNTER — Inpatient Hospital Stay (HOSPITAL_COMMUNITY): Payer: Medicare Other

## 2019-12-28 LAB — BLOOD GAS, ARTERIAL
Acid-Base Excess: 14.7 mmol/L — ABNORMAL HIGH (ref 0.0–2.0)
Bicarbonate: 37.3 mmol/L — ABNORMAL HIGH (ref 20.0–28.0)
FIO2: 26
O2 Saturation: 90.4 %
Patient temperature: 36.7
pCO2 arterial: 54.3 mmHg — ABNORMAL HIGH (ref 32.0–48.0)
pH, Arterial: 7.474 — ABNORMAL HIGH (ref 7.350–7.450)
pO2, Arterial: 59.7 mmHg — ABNORMAL LOW (ref 83.0–108.0)

## 2019-12-28 LAB — BASIC METABOLIC PANEL
Anion gap: 15 (ref 5–15)
BUN: 30 mg/dL — ABNORMAL HIGH (ref 6–20)
CO2: 40 mmol/L — ABNORMAL HIGH (ref 22–32)
Calcium: 9.1 mg/dL (ref 8.9–10.3)
Chloride: 91 mmol/L — ABNORMAL LOW (ref 98–111)
Creatinine, Ser: 0.71 mg/dL (ref 0.61–1.24)
GFR calc Af Amer: >60 mL/min (ref >60)
GFR calc non Af Amer: >60 mL/min (ref >60)
Glucose, Bld: 91 mg/dL (ref 70–99)
Potassium: 3.3 mmol/L — ABNORMAL LOW (ref 3.5–5.1)
Sodium: 146 mmol/L — ABNORMAL HIGH (ref 135–145)

## 2019-12-28 MED ORDER — POTASSIUM CHLORIDE IN NACL 40-0.9 MEQ/L-% IV SOLN
INTRAVENOUS | Status: DC
  Administered 2019-12-28 (×2): 75 mL/h via INTRAVENOUS

## 2019-12-28 MED ORDER — LORAZEPAM 0.5 MG PO TABS
0.5000 mg | ORAL_TABLET | Freq: Four times a day (QID) | ORAL | Status: DC | PRN
  Administered 2019-12-28 – 2019-12-31 (×4): 0.5 mg via ORAL
  Filled 2019-12-28 (×4): qty 1

## 2019-12-28 MED ORDER — HALOPERIDOL LACTATE 5 MG/ML IJ SOLN
5.0000 mg | Freq: Four times a day (QID) | INTRAMUSCULAR | Status: DC | PRN
  Administered 2019-12-28: 5 mg via INTRAVENOUS
  Filled 2019-12-28: qty 1

## 2019-12-28 MED ORDER — DEXMEDETOMIDINE HCL IN NACL 400 MCG/100ML IV SOLN
0.4000 ug/kg/h | INTRAVENOUS | Status: DC
  Administered 2019-12-28: 0.4 ug/kg/h via INTRAVENOUS
  Administered 2019-12-29: 0.7 ug/kg/h via INTRAVENOUS
  Administered 2019-12-29 – 2019-12-30 (×4): 1 ug/kg/h via INTRAVENOUS
  Administered 2019-12-31: 0.8 ug/kg/h via INTRAVENOUS
  Administered 2019-12-31 – 2020-01-01 (×2): 0.4 ug/kg/h via INTRAVENOUS
  Administered 2020-01-01: 0.603 ug/kg/h via INTRAVENOUS
  Administered 2020-01-02: 0.4 ug/kg/h via INTRAVENOUS
  Administered 2020-01-02: 0.6 ug/kg/h via INTRAVENOUS
  Administered 2020-01-03 – 2020-01-04 (×2): 0.4 ug/kg/h via INTRAVENOUS
  Filled 2019-12-28 (×14): qty 100

## 2019-12-28 MED ORDER — LORAZEPAM 2 MG/ML IJ SOLN
1.0000 mg | INTRAMUSCULAR | Status: DC | PRN
  Administered 2019-12-28 – 2020-01-05 (×10): 1 mg via INTRAVENOUS
  Filled 2019-12-28 (×10): qty 1

## 2019-12-28 MED ORDER — FUROSEMIDE 10 MG/ML IJ SOLN
INTRAMUSCULAR | Status: AC
  Filled 2019-12-28: qty 4

## 2019-12-28 MED ORDER — PREDNISONE 20 MG PO TABS
40.0000 mg | ORAL_TABLET | Freq: Every day | ORAL | Status: DC
  Administered 2019-12-29 – 2020-01-01 (×4): 40 mg via ORAL
  Filled 2019-12-28 (×4): qty 2

## 2019-12-28 NOTE — Progress Notes (Signed)
PROGRESS NOTE    Jeffrey [name]  WGN:[id number] DOB: 1961-03-07 DOA: 12/24/2019 PCP: Gareth Morgan, MD    Brief Narrative:  This man was admitted with respiratory failure and was intubated and mechanically ventilated.  He has severe COPD and usually is on 3 to 4 L of oxygen via nasal cannula per minute at home.  He also has a history of diastolic congestive heart failure.  Fortunately, he was liberated from the ventilator and has managed well on the floor and requires 2 L/min of oxygen via nasal cannula to maintain saturation. Unfortunately, he has become more combative/confused in the last 24 hours.  He is on a CIWA protocol.   Assessment & Plan:   Active Problems:   Bipolar disorder with severe depression (HCC)   Essential hypertension   COPD with acute exacerbation (HCC)   Acute on chronic respiratory failure with hypercapnia (HCC)   Acute on chronic respiratory failure with hypoxia and hypercapnia (HCC)   Lobar pneumonia (HCC)   Acute metabolic encephalopathy   Acute on chronic diastolic CHF (congestive heart failure) (HCC)   Aspiration pneumonitis (HCC)   1.  Acute on chronic respiratory failure with hypoxia and hypercarbia.  Now extubated.  I will reduce intravenous steroids to oral now. 2.  Acute on chronic diastolic congestive heart failure.  We can switch to oral Lasix. 3.  Acute metabolic encephalopathy.  Somewhat worse today than yesterday.  This may be related to steroids.  CT scan of the brain is negative. 4.  Hypertension.  Stable. 5.  Bipolar disorder.  Continue with psychoactive medications.  Some of his confusion may be related to this also.   DVT prophylaxis: Lovenox Code Status: Full code  Disposition Plan: Patient was admitted from home.  I believe family might want to take this patient home even though physical therapy is recommending skilled nursing facility.  He is now more confused than yesterday.  It may be related to steroids so I am going to reduce  steroids from intravenous to oral and hopefully this will improve his mental state.  Oxygen requirements are now probably better than baseline at 2 L/min.   Consultants:   Pulmonary.  Procedures:   Mechanical ventilator.  Now extubated by me just  Antimicrobials:   Zosyn.  Vancomycin.   Subjective:  No complaints.  Just appears confused. Objective: Vitals:   12/27/19 2135 12/28/19 0010 12/28/19 0530 12/28/19 0746  BP: 129/76 (!) 146/87 138/88   Pulse: 93 95 96   Resp: (!) 21 (!) 21 19   Temp: 98.4 F (36.9 C) 98.1 F (36.7 C) 98.1 F (36.7 C)   TempSrc:      SpO2: 95% 93% 96% 97%  Weight:   67 kg   Height:        Intake/Output Summary (Last 24 hours) at 12/28/2019 1039 Last data filed at 12/28/2019 0501 Gross per 24 hour  Intake --  Output 501 ml  Net -501 ml   Filed Weights   12/26/19 0400 12/27/19 0500 12/28/19 0530  Weight: 73.5 kg 70.5 kg 67 kg    Examination:  Clearly confused, appears somewhat delirious.  No focal neurological signs.  Hemodynamically stable.  Afebrile.  Lung fields no excessive wheezing, crackles or bronchial breathing.   Data Reviewed: I have personally reviewed following labs and imaging studies  CBC: Recent Labs  Lab 12/24/19 1703 12/24/19 1703 12/24/19 2202 12/25/19 0209 12/26/19 0403 12/26/19 1648 12/27/19 0621  WBC 6.7   < > 4.2 4.2 6.4  9.8 6.8  NEUTROABS 6.4  --   --   --   --   --   --   HGB 11.7*   < > 11.1* 9.7* 10.4* 11.6* 10.9*  HCT 39.6   < > 37.2* 31.6* 33.2* 37.3* 35.9*  MCV 96.4   < > 96.9 95.2 92.7 92.8 94.2  PLT 226   < > 208 192 206 254 221   < > = values in this interval not displayed.   Basic Metabolic Panel: Recent Labs  Lab 12/24/19 2202 12/25/19 0209 12/25/19 0841 12/25/19 1311 12/25/19 1655 12/26/19 0403 12/27/19 0621 12/28/19 0556  NA  --  137 138  --   --  138 145 146*  K  --  3.7 3.3*  --   --  3.5 3.9 3.3*  CL  --  97* 95*  --   --  92* 94* 91*  CO2  --  33* 34*  --   --  35* 41*  40*  GLUCOSE  --  134* 139*  --   --  145* 99 91  BUN  --  12 15  --   --  22* 25* 30*  CREATININE  --  0.67 0.61  --   --  0.64 0.59* 0.71  CALCIUM  --  8.2* 8.2*  --   --  8.3* 8.8* 9.1  MG   < >  --  2.1 2.1 2.0 2.0 2.1  --   PHOS   < >  --  2.1* 2.9 3.4 3.0 5.6*  --    < > = values in this interval not displayed.   GFR: Estimated Creatinine Clearance: 94.2 mL/min (by C-G formula based on SCr of 0.71 mg/dL). Liver Function Tests: Recent Labs  Lab 12/24/19 1703 12/25/19 0841  AST 26 19  ALT 19 14  ALKPHOS 82 58  BILITOT 0.6 0.6  PROT 7.1 5.2*  ALBUMIN 3.4* 2.5*   No results for input(s): LIPASE, AMYLASE in the last 168 hours. Recent Labs  Lab 12/24/19 1703 12/25/19 0841  AMMONIA 56* 30   Coagulation Profile: No results for input(s): INR, PROTIME in the last 168 hours. Cardiac Enzymes: No results for input(s): CKTOTAL, CKMB, CKMBINDEX, TROPONINI in the last 168 hours. BNP (last 3 results) No results for input(s): PROBNP in the last 8760 hours. HbA1C: No results for input(s): HGBA1C in the last 72 hours. CBG: Recent Labs  Lab 12/26/19 0416 12/26/19 0726 12/26/19 1132 12/27/19 0729 12/27/19 1134  GLUCAP 140* 109* 152* 89 93   Lipid Profile: No results for input(s): CHOL, HDL, LDLCALC, TRIG, CHOLHDL, LDLDIRECT in the last 72 hours. Thyroid Function Tests: No results for input(s): TSH, T4TOTAL, FREET4, T3FREE, THYROIDAB in the last 72 hours. Anemia Panel: No results for input(s): VITAMINB12, FOLATE, FERRITIN, TIBC, IRON, RETICCTPCT in the last 72 hours. Sepsis Labs: Recent Labs  Lab 12/24/19 1703 12/24/19 1830 12/24/19 2202 12/25/19 0209 12/25/19 0841  PROCALCITON  --   --   --   --  <0.10  LATICACIDVEN 1.7 3.2* 3.6* 3.6*  --     Recent Results (from the past 240 hour(s))  Respiratory Panel by RT PCR (Flu A&B, Covid) - Nasopharyngeal Swab     Status: None   Collection Time: 12/24/19  3:38 PM   Specimen: Nasopharyngeal Swab  Result Value Ref Range  Status   SARS Coronavirus 2 by RT PCR NEGATIVE NEGATIVE Final    Comment: (NOTE) SARS-CoV-2 target nucleic acids are NOT DETECTED. The  SARS-CoV-2 RNA is generally detectable in upper respiratoy specimens during the acute phase of infection. The lowest concentration of SARS-CoV-2 viral copies this assay can detect is 131 copies/mL. A negative result does not preclude SARS-Cov-2 infection and should not be used as the sole basis for treatment or other patient management decisions. A negative result may occur with  improper specimen collection/handling, submission of specimen other than nasopharyngeal swab, presence of viral mutation(s) within the areas targeted by this assay, and inadequate number of viral copies (<131 copies/mL). A negative result must be combined with clinical observations, patient history, and epidemiological information. The expected result is Negative. Fact Sheet for Patients:  https://www.moore.com/ Fact Sheet for Healthcare Providers:  https://www.young.biz/ This test is not yet ap proved or cleared by the [place] FDA and  has been authorized for detection and/or diagnosis of SARS-CoV-2 by FDA under an Emergency Use Authorization (EUA). This EUA will remain  in effect (meaning this test can be used) for the duration of the COVID-19 declaration under Section 564(b)(1) of the Act, 21 U.S.C. section 360bbb-3(b)(1), unless the authorization is terminated or revoked sooner.    Influenza A by PCR NEGATIVE NEGATIVE Final   Influenza B by PCR NEGATIVE NEGATIVE Final    Comment: (NOTE) The Xpert Xpress SARS-CoV-2/FLU/RSV assay is intended as an aid in  the diagnosis of influenza from Nasopharyngeal swab specimens and  should not be used as a sole basis for treatment. Nasal washings and  aspirates are unacceptable for Xpert Xpress SARS-CoV-2/FLU/RSV  testing. Fact Sheet for  Patients: https://www.moore.com/ Fact Sheet for Healthcare Providers: https://www.young.biz/ This test is not yet approved or cleared by the [place] FDA and  has been authorized for detection and/or diagnosis of SARS-CoV-2 by  FDA under an Emergency Use Authorization (EUA). This EUA will remain  in effect (meaning this test can be used) for the duration of the  Covid-19 declaration under Section 564(b)(1) of the Act, 21  U.S.C. section 360bbb-3(b)(1), unless the authorization is  terminated or revoked. Performed at [facility], [address], [place], [place] [address]   Blood culture (routine x 2)     Status: None (Preliminary result)   Collection Time: 12/24/19  5:04 PM   Specimen: BLOOD LEFT HAND  Result Value Ref Range Status   Specimen Description BLOOD LEFT HAND  Final   Special Requests   Final    BOTTLES DRAWN AEROBIC AND ANAEROBIC Blood Culture adequate volume   Culture   Final    NO GROWTH 4 DAYS Performed at [facility], [address], [place], [place] [address]    Report Status PENDING  Incomplete  Blood culture (routine x 2)     Status: None (Preliminary result)   Collection Time: 12/24/19  5:05 PM   Specimen: BLOOD LEFT HAND  Result Value Ref Range Status   Specimen Description BLOOD LEFT HAND  Final   Special Requests   Final    BOTTLES DRAWN AEROBIC AND ANAEROBIC Blood Culture adequate volume   Culture   Final    NO GROWTH 4 DAYS Performed at [facility], [address], [place], [place] [address]    Report Status PENDING  Incomplete  MRSA PCR Screening     Status: None   Collection Time: 12/24/19  8:59 PM   Specimen: Nasopharyngeal  Result Value Ref Range Status   MRSA by PCR NEGATIVE NEGATIVE Final    Comment:        The GeneXpert MRSA Assay (FDA approved for  NASAL specimens only), is one component of a comprehensive MRSA colonization surveillance program. It is not intended to diagnose MRSA infection nor  to guide or monitor treatment for MRSA infections. Performed at [facility], [address], [place], [place] [address]   Culture, respiratory (non-expectorated)     Status: None   Collection Time: 12/25/19  8:21 AM   Specimen: Tracheal Aspirate; Respiratory  Result Value Ref Range Status   Specimen Description   Final    TRACHEAL ASPIRATE COLLECTED BY RT Performed at [facility], [address], [place], [place] [address]    Special Requests   Final    NONE Performed at [facility], [address], [place], [place] [address]    Gram Stain   Final    FEW WBC PRESENT, PREDOMINANTLY PMN NO ORGANISMS SEEN    Culture   Final    RARE Consistent with normal respiratory flora. Performed at [facility], [address] [place], [place] [address]    Report Status 12/27/2019 FINAL  Final  MRSA PCR Screening     Status: None   Collection Time: 12/25/19  9:11 AM   Specimen: Nasal Mucosa; Nasopharyngeal  Result Value Ref Range Status   MRSA by PCR NEGATIVE NEGATIVE Final    Comment:        The GeneXpert MRSA Assay (FDA approved for NASAL specimens only), is one component of a comprehensive MRSA colonization surveillance program. It is not intended to diagnose MRSA infection nor to guide or monitor treatment for MRSA infections. Performed at [facility], [address] [place], [place] [address]   Culture, Urine     Status: None   Collection Time: 12/25/19  9:12 AM   Specimen: Urine, Clean Catch  Result Value Ref Range Status   Specimen Description   Final    URINE, CLEAN CATCH Performed at [facility], [address] [place], [place] [address]    Special Requests   Final    NONE Performed at [facility], [address], [place], [place] [address]    Culture   Final    NO GROWTH Performed at [facility], [address] [place], [place] [address]    Report Status 12/26/2019 FINAL  Final         Radiology Studies: No results  found.      Scheduled Meds: . aspirin EC  81 mg Oral QPM  . atorvastatin  20 mg Oral QPM  . budesonide (PULMICORT) nebulizer solution  0.5 mg Nebulization BID  . Chlorhexidine Gluconate Cloth  6 each Topical Daily  . enoxaparin (LOVENOX) injection  40 mg Subcutaneous Q24H  . FLUoxetine  40 mg Oral Daily  . folic acid  1 mg Oral Daily  . ipratropium-albuterol  3 mL Nebulization TID  . mouth rinse  15 mL Mouth Rinse BID  . metoprolol tartrate  2.5 mg Intravenous Q6H  . multivitamin with minerals  1 tablet Oral Daily  . OLANZapine  10 mg Oral QHS  . pantoprazole  40 mg Oral Daily  . predniSONE  40 mg Oral Q breakfast  . thiamine  100 mg Oral Daily   Or  . thiamine  100 mg Intravenous Daily  . valproic acid  500 mg Oral TID   Continuous Infusions: . 0.9 % NaCl with KCl 40 mEq / L    . lactated ringers 10 mL/hr at 12/25/19 1208  . piperacillin-tazobactam (ZOSYN)  IV 3.375 g (12/28/19 0531)  LOS: 4 days       Rolin Schult Normajean Glasgow, MD [facility]   If 7PM-7AM, please contact night-coverage [email or web address]  12/28/2019, 10:39 AM

## 2019-12-28 NOTE — Progress Notes (Signed)
OT Cancellation Note  Patient Details Name: Jeffrey [name] MRN: [id number] DOB: 10/01/1961   Cancelled Treatment:    Reason Eval/Treat Not Completed: Medical issues which prohibited therapy. Sitter in room, reports pt had ativan recently due to combative and trying to climb out of bed. Will check back later when pt is alert and able to participate.    Ezra Sites, OTR/L  [phone] 12/28/2019, 7:55 AM

## 2019-12-28 NOTE — Progress Notes (Signed)
Patient has become more combative on the floor and now requiring intravenous Ativan which I discontinued earlier. Oxygen saturation is 84 to 88% on 2 L of oxygen per minute. He has a resting tachycardia. Plan: 1. Transfer to stepdown again. 2. Chest x-ray on arrival at the stepdown unit. 3. Arterial blood gas.

## 2019-12-28 NOTE — Significant Event (Signed)
Nurse paged regarding patient increased agitation. Uncontrolled with multiple doses of ativan and 5 mg Haldol. Patient continues to pull at medical devices, as well as hit and spit on staff. Precedex ordered. Bed Status changed to ICU

## 2019-12-28 NOTE — Progress Notes (Signed)
Pt becoming more and more agitated. Combative with staff. Pulling at iv line, condom catheter and ekg leads. Spitting at staff. Order for haldol iv received with little to no effect. PRN ativan given with little to no effect. Midlevel provider ordered precedex. Precedex started. New IV started on patient. Sitter is with patient. He continues to be impulsive and not follow commands. Is not quite as agitated and less combative. However he is still tryiing to get out of bed and pulling at his lines. Continue to titrate the precedex drip. Continue to monitor patient for his safety and safety of staff.

## 2019-12-28 NOTE — Progress Notes (Signed)
Called to patient's room by Barbara Cower, RN patient's assigned RN. Pt noted to be increasingly agitated, attempting to climb out of bed. Had been swinging fists and attempting to kick nursing staff per report. Unable to obtain VS due to patient being agitated. Notified Dr. Karilyn Cota. Order received for ativan 1 mg IV and check O2 sats prior to dose. Nursing attempted to check O2 sats, pt agitated and could not get good reading. Notified MD. Stated nursing to give ativan as ordered and reattempt vitals once he had calmed. O2 sats 84% on r/a after ativan. HR 110s-120. O2 2 lpm applied, O2 sats initally 87% on 2 lpm, came up to 96 on 2 lpm. Notified MD. Vitals stable other than HR remained tachy in 110s. Orders received to transfer to stepdown unit. Notified ICU charge nurse of situation. Patient to transfer to stepdown unit once bed available. Safety sitter remains at bedside.

## 2019-12-28 NOTE — Progress Notes (Signed)
Speech Language Pathology Treatment: Dysphagia  Patient Details Name: Jeffrey [name] MRN: [id number] DOB: 1961-05-14 Today's Date: 12/28/2019 Time: 4403-4742 SLP Time Calculation (min) (ACUTE ONLY): 28 min  Assessment / Plan / Recommendation Clinical Impression  Pt seen for ongoing diagnostic dysphagia intervention. He remains primarily aphonic, however voicing achieved intermittently through SLP elicited techniques (most effective was adduction with "Uh-Oh" and "HUT"). Carryover into single words was limited. Pt assessed with ice chips, water, NTL, puree, and regular textures. Pt continues to present with altered mentation which impacted his ability to self feed and suck from the straw independently. Pt with occasional strong cough and wheezing after self presented cup sips of thin water. Pt appeared to have improved oral control with NTL. RN, Barbara Cower, reports that family stated that Pt struggles with bouts of laryngitis every since his intubation in early 2020. Unsure if Pt has been seen by ENT, however this should be considered as an outpatient. Will initiate D3/mech soft and NTL, po medications whole in puree, ok for small sips of water after oral care between meals when presented by nursing staff for Pt comfort. SLP will continue to follow.    HPI HPI: This is a 59 year old man with severe COPD who is usually on 3 to 4 L/min of nasal cannula at home and who has history of diastolic congestive heart failure was admitted 12/24/19 with dyspnea and confusion.  He was intubated and mechanically ventilated fairly quickly in the emergency room. He was extubated 12/26/19. Acute on chronic respiratory failure with hypoxia and hypercarbia .  This is from his severe COPD.  Now extubated.  Continue with steroids, antibiotics and bronchodilators.  Pulmonary following. Pt was seen for MBSS 02/27/20 during acute stay at [facility] and a mechanical soft diet with thin liquids was recommended (Pt refused puree/NTL prior to  MBSS). BSE ordered      SLP Plan  Continue with current plan of care       Recommendations  Diet recommendations: Dysphagia 3 (mechanical soft);Nectar-thick liquid Liquids provided via: Cup;Straw Medication Administration: Whole meds with puree Supervision: Patient able to self feed;Staff to assist with self feeding;Full supervision/cueing for compensatory strategies Compensations: Slow rate;Small sips/bites Postural Changes and/or Swallow Maneuvers: Seated upright 90 degrees;Upright 30-60 min after meal                Oral Care Recommendations: Oral care prior to ice chip/H20;Staff/trained caregiver to provide oral care;Oral care BID Follow up Recommendations: 24 hour supervision/assistance SLP Visit Diagnosis: Dysphagia, unspecified (R13.10) Plan: Continue with current plan of care       Thank you,  Havery Moros, CCC-SLP [phone]                 Jeffrey [name] 12/28/2019, 2:55 PM

## 2019-12-28 NOTE — Progress Notes (Signed)
Physical Therapy Treatment Patient Details Name: Jeffrey [name] MRN: [id number] DOB: November 23, 1961 Today's Date: 12/28/2019    History of Present Illness Jeffrey [name] is a 59 y.o. male with medical history significant for COPD with chronic respiratory failure, bipolar disorder, diastolic CHF, hepatic encephalopathy, depression. History is obtained from chart review and talking to patient's ex-wife.  Per chart review patient was brought into the ED reports of difficulty breathing over the past 3 days, with confusion, and in the ED patient was tremulous. Patient's ex-wife who now lives with patient tells me patient has not had any drink or use any drugs in the past year.  She reports increasing difficulty breathing and productive cough.  No vomiting no loose stools.  She said the past 3 days when patient was confused tells when he said he wanted someone to shoot him.  She otherwise denies suicidal ideations.  She also reports patient has gotten tired of using oxygen, his bronchodilators, and lying around as normally he likes to be active.    PT Comments    Patient presents with sitter in room due to constantly attempting to get out of bed and restlessness.  Patient cooperative with therapy and tolerated standing marching in place x 2 bouts of 4-5 minutes with 3-4 minute rest break in between, able to complete exercises with much verbal cueing and demonstration with fair carryover and put back to bed with sitter in room after therapy.  Patient will benefit from continued physical therapy in hospital and recommended venue below to increase strength, balance, endurance for safe ADLs and gait.   Follow Up Recommendations  SNF     Equipment Recommendations  None recommended by PT    Recommendations for Other Services       Precautions / Restrictions Precautions Precautions: Fall Restrictions Weight Bearing Restrictions: No    Mobility  Bed Mobility Overal bed mobility: Needs Assistance Bed  Mobility: Supine to Sit;Sit to Supine     Supine to sit: Min assist Sit to supine: Min assist   General bed mobility comments: slow labored movement  Transfers Overall transfer level: Needs assistance Equipment used: 1 person hand held assist Transfers: Sit to/from [facility] Sit to Stand: Min assist Stand pivot transfers: Min assist       General transfer comment: requires repeated attempts before completing sit to stands due to BLE weakness  Ambulation/Gait Ambulation/Gait assistance: Min assist;Mod assist Gait Distance (Feet): 20 Feet Assistive device: 1 person hand held assist Gait Pattern/deviations: Decreased step length - right;Decreased step length - left;Decreased stride length Gait velocity: decreased   General Gait Details: limited to marching in place for up to 20-25 steps, very unsteady with frequent leaning backwards   Stairs             Wheelchair Mobility    Modified Rankin (Stroke Patients Only)       Balance Overall balance assessment: Needs assistance Sitting-balance support: Feet supported;No upper extremity supported Sitting balance-Leahy Scale: Fair Sitting balance - Comments: seated EOB   Standing balance support: During functional activity;Bilateral upper extremity supported Standing balance-Leahy Scale: Poor Standing balance comment: with bilateral hand held assist                            Cognition Arousal/Alertness: Awake/alert Behavior During Therapy: Restless;Flat affect Overall Cognitive Status: No family/caregiver present to determine baseline cognitive functioning  Exercises General Exercises - Lower Extremity Long Arc Quad: Seated;AROM;Strengthening;Both;15 reps Hip Flexion/Marching: Seated;AROM;Strengthening;Both;15 reps Mini-Sqauts: Standing;AROM;Right;Both;5 reps    General Comments        Pertinent Vitals/Pain Pain  Assessment: No/denies pain    Home Living                      Prior Function            PT Goals (current goals can now be found in the care plan section) Acute Rehab PT Goals Patient Stated Goal: return home PT Goal Formulation: With patient Time For Goal Achievement: 01/09/20 Potential to Achieve Goals: Fair Progress towards PT goals: Progressing toward goals    Frequency    Min 3X/week      PT Plan Current plan remains appropriate    Co-evaluation              AM-PAC PT "6 Clicks" Mobility   Outcome Measure  Help needed turning from your back to your side while in a flat bed without using bedrails?: None Help needed moving from lying on your back to sitting on the side of a flat bed without using bedrails?: A Little Help needed moving to and from a bed to a chair (including a wheelchair)?: A Little Help needed standing up from a chair using your arms (e.g., wheelchair or bedside chair)?: A Little Help needed to walk in hospital room?: A Lot Help needed climbing 3-5 steps with a railing? : A Lot 6 Click Score: 17    End of Session   Activity Tolerance: Patient limited by fatigue;Patient tolerated treatment well Patient left: in bed;with call bell/phone within reach;with nursing/sitter in room Nurse Communication: Mobility status PT Visit Diagnosis: Unsteadiness on feet (R26.81);Other abnormalities of gait and mobility (R26.89);Muscle weakness (generalized) (M62.81)     Time: 8891-6945 PT Time Calculation (min) (ACUTE ONLY): 28 min  Charges:  $Therapeutic Exercise: 8-22 mins $Therapeutic Activity: 8-22 mins                     4:06 PM, 12/28/19 Ocie Bob, MPT Physical Therapist with [facility] 336 [phone] office [phone] mobile phone

## 2019-12-29 DIAGNOSIS — J690 Pneumonitis due to inhalation of food and vomit: Principal | ICD-10-CM

## 2019-12-29 LAB — BASIC METABOLIC PANEL
Anion gap: 8 (ref 5–15)
BUN: 30 mg/dL — ABNORMAL HIGH (ref 6–20)
CO2: 37 mmol/L — ABNORMAL HIGH (ref 22–32)
Calcium: 8.6 mg/dL — ABNORMAL LOW (ref 8.9–10.3)
Chloride: 99 mmol/L (ref 98–111)
Creatinine, Ser: 0.78 mg/dL (ref 0.61–1.24)
GFR calc Af Amer: >60 mL/min (ref >60)
GFR calc non Af Amer: >60 mL/min (ref >60)
Glucose, Bld: 140 mg/dL — ABNORMAL HIGH (ref 70–99)
Potassium: 3.2 mmol/L — ABNORMAL LOW (ref 3.5–5.1)
Sodium: 144 mmol/L (ref 135–145)

## 2019-12-29 LAB — BLOOD GAS, ARTERIAL
Acid-Base Excess: 13.6 mmol/L — ABNORMAL HIGH (ref 0.0–2.0)
Bicarbonate: 36.1 mmol/L — ABNORMAL HIGH (ref 20.0–28.0)
FIO2: 32.1
O2 Saturation: 95.7 %
Patient temperature: 37.2
pCO2 arterial: 62.4 mmHg — ABNORMAL HIGH (ref 32.0–48.0)
pH, Arterial: 7.412 (ref 7.350–7.450)
pO2, Arterial: 84.1 mmHg (ref 83.0–108.0)

## 2019-12-29 LAB — CBC
HCT: 35.1 % — ABNORMAL LOW (ref 39.0–52.0)
Hemoglobin: 11.2 g/dL — ABNORMAL LOW (ref 13.0–17.0)
MCH: 29.5 pg (ref 26.0–34.0)
MCHC: 31.9 g/dL (ref 30.0–36.0)
MCV: 92.4 fL (ref 80.0–100.0)
Platelets: 207 10*3/uL (ref 150–400)
RBC: 3.8 MIL/uL — ABNORMAL LOW (ref 4.22–5.81)
RDW: 12.5 % (ref 11.5–15.5)
WBC: 4.8 10*3/uL (ref 4.0–10.5)
nRBC: 0 % (ref 0.0–0.2)

## 2019-12-29 LAB — URINALYSIS, ROUTINE W REFLEX MICROSCOPIC
Bilirubin Urine: NEGATIVE
Glucose, UA: NEGATIVE mg/dL
Hgb urine dipstick: NEGATIVE
Ketones, ur: NEGATIVE mg/dL
Leukocytes,Ua: NEGATIVE
Nitrite: NEGATIVE
Protein, ur: NEGATIVE mg/dL
Specific Gravity, Urine: 1.02 (ref 1.005–1.030)
pH: 7 (ref 5.0–8.0)

## 2019-12-29 LAB — CULTURE, BLOOD (ROUTINE X 2)
Culture: NO GROWTH
Culture: NO GROWTH
Special Requests: ADEQUATE
Special Requests: ADEQUATE

## 2019-12-29 LAB — AMMONIA: Ammonia: 43 umol/L — ABNORMAL HIGH (ref 9–35)

## 2019-12-29 MED ORDER — METOPROLOL TARTRATE 25 MG PO TABS
25.0000 mg | ORAL_TABLET | Freq: Two times a day (BID) | ORAL | Status: DC
  Administered 2019-12-29 – 2020-01-06 (×15): 25 mg via ORAL
  Filled 2019-12-29 (×19): qty 1

## 2019-12-29 MED ORDER — POTASSIUM CHLORIDE 10 MEQ/100ML IV SOLN
10.0000 meq | INTRAVENOUS | Status: AC
  Administered 2019-12-29 (×4): 10 meq via INTRAVENOUS
  Filled 2019-12-29 (×4): qty 100

## 2019-12-29 MED ORDER — STARCH (THICKENING) PO POWD
ORAL | Status: DC | PRN
  Filled 2019-12-29: qty 227

## 2019-12-29 NOTE — Progress Notes (Signed)
PT Cancellation Note  Patient Details Name: Jeffrey [name] MRN: [id number] DOB: 1961-01-08   Cancelled Treatment:    Reason Eval/Treat Not Completed: Medical issues which prohibited therapy;Other (comment) Pt transferred to step-down due to agitation and combative behavior and poor response to medications. Pt is not appropriate for therapy services at this time. Per [facility] policy pt has had a change in status and a new order will be required for PT evaluation when pt is appropriate for therapy services.   8:42 AM, 12/29/19 Wyman Songster PT, DPT Physical Therapist at [facility]

## 2019-12-29 NOTE — Progress Notes (Signed)
PROGRESS NOTE    Jeffrey [name]  OQH:[id number] DOB: Dec 18, 1960 DOA: 12/24/2019 PCP: Gareth Morgan, MD    Brief Narrative:  59 year old male with history of COPD is chronically on 3 to 4 L of oxygen, admitted with worsening shortness of breath.  Felt to be related to COPD exacerbation.  He was intubated in the emergency room and admitted to the ICU.  Fortunately, he was able to extubate on 2/2.  Hospital course further complicated by development of acute metabolic encephalopathy.  Etiology is unclear, but may be related to high-dose steroids.  Is currently on Precedex infusion.  Steroids are being tapered.   Assessment & Plan:   Active Problems:   Bipolar disorder with severe depression (HCC)   Essential hypertension   COPD with acute exacerbation (HCC)   Acute on chronic respiratory failure with hypercapnia (HCC)   Acute on chronic respiratory failure with hypoxia and hypercapnia (HCC)   Lobar pneumonia (HCC)   Acute metabolic encephalopathy   Acute on chronic diastolic CHF (congestive heart failure) (HCC)   Aspiration pneumonitis (HCC)   1. Acute on chronic respiratory failure with hypoxia.  Secondary to COPD exacerbation and aspiration pneumonia.  Patient was intubated on admission, but was able to extubate 2/2.  Overall respiratory status appears to be stable. 2. COPD exacerbation.  Wean prednisone.  Continue bronchodilators.  Respiratory status appears to be stable at this time. 3. Aspiration pneumonia.  Continue on IV antibiotics for now.  He is on modified diet with thickened liquids. 4. Acute metabolic encephalopathy.  Patient developed significant agitation/delirium yesterday and required transfer to ICU for further management.  When he did not respond to Ativan, he was started on Precedex infusion.  Blood gas last night showed mildly elevated PCO2, but pH is compensated.  Will repeat blood gas today.  Check urinalysis, ammonia.  He did not have any focal deficits.  Continue  to wean off Precedex as tolerated.  Discussed with patient's wife and he does not have a history of alcohol use. 5. Bipolar disorder.  Continue with psychotropic medications 6. Hypertension.  Currently on intravenous metoprolol.  Will transition to p.o. once he is reliably taking oral medications.   DVT prophylaxis: Lovenox Code Status: Full code Family Communication: Discussed with ex-wife over the phone who the patient resides with Disposition Plan: Discharge home once mental status has stabilized off of Precedex infusion   Consultants:   Pulmonology  Procedures:   Intubation  Antimicrobials:   Zosyn 1/31>   Subjective: Patient became agitated overnight and was noted to ICU.  Started on Precedex since he was not responding to Ativan.  At this time, he appears to be somnolent, but calm.  Precedex is being weaned down.  Objective: Vitals:   12/29/19 0645 12/29/19 0700 12/29/19 0730 12/29/19 0837  BP: 137/74 139/73 (!) 141/77   Pulse: 72 75 74   Resp: (!) 36 (!) 33 (!) 32   Temp:      TempSrc:      SpO2: 99% 98% 97% 99%  Weight:      Height:        Intake/Output Summary (Last 24 hours) at 12/29/2019 1111 Last data filed at 12/29/2019 0918 Gross per 24 hour  Intake 1752.85 ml  Output --  Net 1752.85 ml   Filed Weights   12/27/19 0500 12/28/19 0530 12/29/19 0500  Weight: 70.5 kg 67 kg 66.8 kg    Examination:  General exam: Appears calm and comfortable  Respiratory system: Clear to auscultation.  Respiratory effort normal. Cardiovascular system: S1 & S2 heard, RRR. No JVD, murmurs, rubs, gallops or clicks. No pedal edema. Gastrointestinal system: Abdomen is nondistended, soft and nontender. No organomegaly or masses felt. Normal bowel sounds heard. Central nervous system: Somnolent no focal neurological deficits. Extremities: Symmetric 5 x 5 power. Skin: No rashes, lesions or ulcers Psychiatry: Somnolent, wakes up to voice, still appears to be confused    Data  Reviewed: I have personally reviewed following labs and imaging studies  CBC: Recent Labs  Lab 12/24/19 1703 12/24/19 1703 12/24/19 2202 12/25/19 0209 12/26/19 0403 12/26/19 1648 12/27/19 0621  WBC 6.7   < > 4.2 4.2 6.4 9.8 6.8  NEUTROABS 6.4  --   --   --   --   --   --   HGB 11.7*   < > 11.1* 9.7* 10.4* 11.6* 10.9*  HCT 39.6   < > 37.2* 31.6* 33.2* 37.3* 35.9*  MCV 96.4   < > 96.9 95.2 92.7 92.8 94.2  PLT 226   < > 208 192 206 254 221   < > = values in this interval not displayed.   Basic Metabolic Panel: Recent Labs  Lab 12/24/19 2202 12/25/19 0209 12/25/19 0841 12/25/19 1311 12/25/19 1655 12/26/19 0403 12/27/19 0621 12/28/19 0556  NA  --  137 138  --   --  138 145 146*  K  --  3.7 3.3*  --   --  3.5 3.9 3.3*  CL  --  97* 95*  --   --  92* 94* 91*  CO2  --  33* 34*  --   --  35* 41* 40*  GLUCOSE  --  134* 139*  --   --  145* 99 91  BUN  --  12 15  --   --  22* 25* 30*  CREATININE  --  0.67 0.61  --   --  0.64 0.59* 0.71  CALCIUM  --  8.2* 8.2*  --   --  8.3* 8.8* 9.1  MG   < >  --  2.1 2.1 2.0 2.0 2.1  --   PHOS   < >  --  2.1* 2.9 3.4 3.0 5.6*  --    < > = values in this interval not displayed.   GFR: Estimated Creatinine Clearance: 93.9 mL/min (by C-G formula based on SCr of 0.71 mg/dL). Liver Function Tests: Recent Labs  Lab 12/24/19 1703 12/25/19 0841  AST 26 19  ALT 19 14  ALKPHOS 82 58  BILITOT 0.6 0.6  PROT 7.1 5.2*  ALBUMIN 3.4* 2.5*   No results for input(s): LIPASE, AMYLASE in the last 168 hours. Recent Labs  Lab 12/24/19 1703 12/25/19 0841  AMMONIA 56* 30   Coagulation Profile: No results for input(s): INR, PROTIME in the last 168 hours. Cardiac Enzymes: No results for input(s): CKTOTAL, CKMB, CKMBINDEX, TROPONINI in the last 168 hours. BNP (last 3 results) No results for input(s): PROBNP in the last 8760 hours. HbA1C: No results for input(s): HGBA1C in the last 72 hours. CBG: Recent Labs  Lab 12/26/19 0416 12/26/19 0726  12/26/19 1132 12/27/19 0729 12/27/19 1134  GLUCAP 140* 109* 152* 89 93   Lipid Profile: No results for input(s): CHOL, HDL, LDLCALC, TRIG, CHOLHDL, LDLDIRECT in the last 72 hours. Thyroid Function Tests: No results for input(s): TSH, T4TOTAL, FREET4, T3FREE, THYROIDAB in the last 72 hours. Anemia Panel: No results for input(s): VITAMINB12, FOLATE, FERRITIN, TIBC, IRON, RETICCTPCT in the last 72  hours. Sepsis Labs: Recent Labs  Lab 12/24/19 1703 12/24/19 1830 12/24/19 2202 12/25/19 0209 12/25/19 0841  PROCALCITON  --   --   --   --  <0.10  LATICACIDVEN 1.7 3.2* 3.6* 3.6*  --     Recent Results (from the past 240 hour(s))  Respiratory Panel by RT PCR (Flu A&B, Covid) - Nasopharyngeal Swab     Status: None   Collection Time: 12/24/19  3:38 PM   Specimen: Nasopharyngeal Swab  Result Value Ref Range Status   SARS Coronavirus 2 by RT PCR NEGATIVE NEGATIVE Final    Comment: (NOTE) SARS-CoV-2 target nucleic acids are NOT DETECTED. The SARS-CoV-2 RNA is generally detectable in upper respiratoy specimens during the acute phase of infection. The lowest concentration of SARS-CoV-2 viral copies this assay can detect is 131 copies/mL. A negative result does not preclude SARS-Cov-2 infection and should not be used as the sole basis for treatment or other patient management decisions. A negative result may occur with  improper specimen collection/handling, submission of specimen other than nasopharyngeal swab, presence of viral mutation(s) within the areas targeted by this assay, and inadequate number of viral copies (<131 copies/mL). A negative result must be combined with clinical observations, patient history, and epidemiological information. The expected result is Negative. Fact Sheet for Patients:  https://www.moore.com/ Fact Sheet for Healthcare Providers:  https://www.young.biz/ This test is not yet ap proved or cleared by the [place]  FDA and  has been authorized for detection and/or diagnosis of SARS-CoV-2 by FDA under an Emergency Use Authorization (EUA). This EUA will remain  in effect (meaning this test can be used) for the duration of the COVID-19 declaration under Section 564(b)(1) of the Act, 21 U.S.C. section 360bbb-3(b)(1), unless the authorization is terminated or revoked sooner.    Influenza A by PCR NEGATIVE NEGATIVE Final   Influenza B by PCR NEGATIVE NEGATIVE Final    Comment: (NOTE) The Xpert Xpress SARS-CoV-2/FLU/RSV assay is intended as an aid in  the diagnosis of influenza from Nasopharyngeal swab specimens and  should not be used as a sole basis for treatment. Nasal washings and  aspirates are unacceptable for Xpert Xpress SARS-CoV-2/FLU/RSV  testing. Fact Sheet for Patients: https://www.moore.com/ Fact Sheet for Healthcare Providers: https://www.young.biz/ This test is not yet approved or cleared by the [place] FDA and  has been authorized for detection and/or diagnosis of SARS-CoV-2 by  FDA under an Emergency Use Authorization (EUA). This EUA will remain  in effect (meaning this test can be used) for the duration of the  Covid-19 declaration under Section 564(b)(1) of the Act, 21  U.S.C. section 360bbb-3(b)(1), unless the authorization is  terminated or revoked. Performed at [facility], [address] [place], [place] [address]   Blood culture (routine x 2)     Status: None   Collection Time: 12/24/19  5:04 PM   Specimen: BLOOD LEFT HAND  Result Value Ref Range Status   Specimen Description BLOOD LEFT HAND  Final   Special Requests   Final    BOTTLES DRAWN AEROBIC AND ANAEROBIC Blood Culture adequate volume   Culture   Final    NO GROWTH 5 DAYS Performed at [facility], [address] [place], [place] [address]    Report Status 12/29/2019 FINAL  Final  Blood culture (routine x 2)     Status: None   Collection Time: 12/24/19  5:05 PM    Specimen: BLOOD LEFT HAND  Result Value Ref Range Status   Specimen Description  BLOOD LEFT HAND  Final   Special Requests   Final    BOTTLES DRAWN AEROBIC AND ANAEROBIC Blood Culture adequate volume   Culture   Final    NO GROWTH 5 DAYS Performed at [facility], [address], [place], [place] [address]    Report Status 12/29/2019 FINAL  Final  MRSA PCR Screening     Status: None   Collection Time: 12/24/19  8:59 PM   Specimen: Nasopharyngeal  Result Value Ref Range Status   MRSA by PCR NEGATIVE NEGATIVE Final    Comment:        The GeneXpert MRSA Assay (FDA approved for NASAL specimens only), is one component of a comprehensive MRSA colonization surveillance program. It is not intended to diagnose MRSA infection nor to guide or monitor treatment for MRSA infections. Performed at [facility], [address] [place], [place] [address]   Culture, respiratory (non-expectorated)     Status: None   Collection Time: 12/25/19  8:21 AM   Specimen: Tracheal Aspirate; Respiratory  Result Value Ref Range Status   Specimen Description   Final    TRACHEAL ASPIRATE COLLECTED BY RT Performed at [facility], [address], [place], [place] [address]    Special Requests   Final    NONE Performed at [facility], [address], [place], [place] [address]    Gram Stain   Final    FEW WBC PRESENT, PREDOMINANTLY PMN NO ORGANISMS SEEN    Culture   Final    RARE Consistent with normal respiratory flora. Performed at [facility], [address], [place], [place] [address]    Report Status 12/27/2019 FINAL  Final  MRSA PCR Screening     Status: None   Collection Time: 12/25/19  9:11 AM   Specimen: Nasal Mucosa; Nasopharyngeal  Result Value Ref Range Status   MRSA by PCR NEGATIVE NEGATIVE Final    Comment:        The GeneXpert MRSA Assay (FDA approved for NASAL specimens only), is one component of a comprehensive MRSA colonization surveillance program. It is not intended to  diagnose MRSA infection nor to guide or monitor treatment for MRSA infections. Performed at [facility], [address] [place], [place] [address]   Culture, Urine     Status: None   Collection Time: 12/25/19  9:12 AM   Specimen: Urine, Clean Catch  Result Value Ref Range Status   Specimen Description   Final    URINE, CLEAN CATCH Performed at [facility], [address], [place], [place] [address]    Special Requests   Final    NONE Performed at [facility], [address] [place], [place] [address]    Culture   Final    NO GROWTH Performed at [facility], [address] [place], [place] [address]    Report Status 12/26/2019 FINAL  Final         Radiology Studies: DG CHEST PORT 1 VIEW  Result Date: 12/28/2019 CLINICAL DATA:  Hypoxia EXAM: PORTABLE CHEST 1 VIEW COMPARISON:  Chest radiograph 12/26/2019 FINDINGS: The lungs are hyperinflated with diffuse interstitial prominence. No focal airspace consolidation or pulmonary edema. No pleural effusion or pneumothorax. Normal cardiomediastinal contours. IMPRESSION: Findings of COPD without acute airspace disease. Electronically Signed   By: Deatra Robinson M.D.   On: 12/28/2019 19:20        Scheduled Meds: . aspirin EC  81 mg Oral QPM  . atorvastatin  20 mg  Oral QPM  . budesonide (PULMICORT) nebulizer solution  0.5 mg Nebulization BID  . Chlorhexidine Gluconate Cloth  6 each Topical Daily  . enoxaparin (LOVENOX) injection  40 mg Subcutaneous Q24H  . FLUoxetine  40 mg Oral Daily  . folic acid  1 mg Oral Daily  . ipratropium-albuterol  3 mL Nebulization TID  . mouth rinse  15 mL Mouth Rinse BID  . metoprolol tartrate  2.5 mg Intravenous Q6H  . multivitamin with minerals  1 tablet Oral Daily  . OLANZapine  10 mg Oral QHS  . pantoprazole  40 mg Oral Daily  . predniSONE  40 mg Oral Q breakfast  . thiamine  100 mg Oral Daily   Or  . thiamine  100 mg Intravenous Daily  . valproic acid  500 mg Oral TID   Continuous  Infusions: . 0.9 % NaCl with KCl 40 mEq / L Stopped (12/29/19 0507)  . dexmedetomidine (PRECEDEX) IV infusion 0.8 mcg/kg/hr (12/29/19 0916)  . lactated ringers 10 mL/hr at 12/25/19 1208  . piperacillin-tazobactam (ZOSYN)  IV 3.375 g (12/29/19 0506)     LOS: 5 days    Time spent: 92mins    Kathie Dike, MD [facility]   If 7PM-7AM, please contact night-coverage [email or web address]  12/29/2019, 11:11 AM

## 2019-12-29 NOTE — Progress Notes (Signed)
SLP Cancellation Note  Patient Details Name: CLEATIS FANDRICH MRN: [id number] DOB: [birth year]   Cancelled treatment:       Reason Eval/Treat Not Completed: Patient's level of consciousness;Fatigue/lethargy limiting ability to participate. ST will re-attempt as schedule permits.  Braiden Rodman H. Romie Levee, CCC-SLP Speech Language Pathologist    Georgetta Haber 12/29/2019, 10:21 AM

## 2019-12-29 NOTE — Progress Notes (Signed)
OT Cancellation Note  Patient Details Name: Jeffrey [name] MRN: [id number] DOB: November 03, 1961   Cancelled Treatment:    Reason Eval/Treat Not Completed: Medical issues which prohibited therapy;Other (comment). Pt transferred to step-down due to agitation and combative behavior and poor response to medications. Pt is not appropriate for therapy services at this time. Per [facility] policy pt has had a change in status and a new order will be required for OT evaluation when pt is appropriate for therapy services.   Ezra Sites, OTR/L  [phone] 12/29/2019, 7:39 AM

## 2019-12-30 LAB — COMPREHENSIVE METABOLIC PANEL
ALT: 22 U/L (ref 0–44)
AST: 20 U/L (ref 15–41)
Albumin: 2.7 g/dL — ABNORMAL LOW (ref 3.5–5.0)
Alkaline Phosphatase: 52 U/L (ref 38–126)
Anion gap: 9 (ref 5–15)
BUN: 22 mg/dL — ABNORMAL HIGH (ref 6–20)
CO2: 33 mmol/L — ABNORMAL HIGH (ref 22–32)
Calcium: 8.7 mg/dL — ABNORMAL LOW (ref 8.9–10.3)
Chloride: 99 mmol/L (ref 98–111)
Creatinine, Ser: 0.75 mg/dL (ref 0.61–1.24)
GFR calc Af Amer: >60 mL/min (ref >60)
GFR calc non Af Amer: >60 mL/min (ref >60)
Glucose, Bld: 115 mg/dL — ABNORMAL HIGH (ref 70–99)
Potassium: 4 mmol/L (ref 3.5–5.1)
Sodium: 141 mmol/L (ref 135–145)
Total Bilirubin: 0.7 mg/dL (ref 0.3–1.2)
Total Protein: 5.5 g/dL — ABNORMAL LOW (ref 6.5–8.1)

## 2019-12-30 LAB — CBC
HCT: 34.8 % — ABNORMAL LOW (ref 39.0–52.0)
Hemoglobin: 10.9 g/dL — ABNORMAL LOW (ref 13.0–17.0)
MCH: 28.8 pg (ref 26.0–34.0)
MCHC: 31.3 g/dL (ref 30.0–36.0)
MCV: 91.8 fL (ref 80.0–100.0)
Platelets: 208 10*3/uL (ref 150–400)
RBC: 3.79 MIL/uL — ABNORMAL LOW (ref 4.22–5.81)
RDW: 12.2 % (ref 11.5–15.5)
WBC: 5 10*3/uL (ref 4.0–10.5)
nRBC: 0 % (ref 0.0–0.2)

## 2019-12-30 MED ORDER — PIPERACILLIN-TAZOBACTAM 3.375 G IVPB
3.3750 g | Freq: Three times a day (TID) | INTRAVENOUS | Status: AC
  Administered 2019-12-30 – 2019-12-31 (×3): 3.375 g via INTRAVENOUS
  Filled 2019-12-30 (×3): qty 50

## 2019-12-30 NOTE — Progress Notes (Addendum)
PROGRESS NOTE    Jeffrey [name]  NOM:[id number] DOB: 1961-09-13 DOA: 12/24/2019 PCP: Gareth Morgan, MD    Brief Narrative:  59 year old male with history of COPD is chronically on 3 to 4 L of oxygen, admitted with worsening shortness of breath.  Felt to be related to COPD exacerbation.  He was intubated in the emergency room and admitted to the ICU.  Fortunately, he was able to extubate on 2/2.  Hospital course further complicated by development of acute metabolic encephalopathy.  Etiology is unclear, but may be related to high-dose steroids.  Is currently on Precedex infusion.  Steroids are being tapered.   Assessment & Plan:   Active Problems:   Bipolar disorder with severe depression (HCC)   Essential hypertension   COPD with acute exacerbation (HCC)   Acute on chronic respiratory failure with hypercapnia (HCC)   Acute on chronic respiratory failure with hypoxia and hypercapnia (HCC)   Lobar pneumonia (HCC)   Acute metabolic encephalopathy   Acute on chronic diastolic CHF (congestive heart failure) (HCC)   Aspiration pneumonitis (HCC)   1. Acute on chronic respiratory failure with hypoxia.  Secondary to COPD exacerbation and aspiration pneumonia.  Patient was intubated on admission, but was able to extubate 2/2.  Overall respiratory status appears to be stable. 2. COPD exacerbation.  Wean prednisone.  Continue bronchodilators.  Respiratory status appears to be stable at this time. 3. Aspiration pneumonia.  He will likely complete his course of antibiotics today.  He is on modified diet with thickened liquids. 4. Acute metabolic encephalopathy.  Patient developed significant agitation/delirium yesterday and required transfer to ICU for further management.  When he did not respond to Ativan, he was started on Precedex infusion.  Blood gas last night showed mildly elevated PCO2, but pH is compensated.  Urinalysis unremarkable and ammonia is only minimally elevated.  He did not have any  focal deficits.  Continue to wean off Precedex as tolerated.  Discussed with patient's wife and he does not have a history of alcohol use.  It is possible that his encephalopathy may have been related to high-dose steroids. 5. Bipolar disorder.  Continue with psychotropic medications 6. Hypertension.  Stable on metoprolol.   DVT prophylaxis: Lovenox Code Status: Full code Family Communication: Discussed with ex-wife over the phone who the patient resides with Disposition Plan: Discharge to skilled nursing facility once mental status has stabilized off of Precedex infusion   Consultants:   Pulmonology  Procedures:   Intubation  Antimicrobials:   Zosyn 1/31>   Subjective: Patient is continued on Precedex infusion, but is being weaned down.  He appears calm, answering questions appropriately..  Objective: Vitals:   12/30/19 1334 12/30/19 1400 12/30/19 1500 12/30/19 1530  BP:      Pulse:  78 71 65  Resp:    (!) 24  Temp:      TempSrc:      SpO2: 96%   98%  Weight:      Height:        Intake/Output Summary (Last 24 hours) at 12/30/2019 1601 Last data filed at 12/30/2019 1031 Gross per 24 hour  Intake 357.17 ml  Output 800 ml  Net -442.83 ml   Filed Weights   12/27/19 0500 12/28/19 0530 12/29/19 0500  Weight: 70.5 kg 67 kg 66.8 kg    Examination:  General exam: Appears calm and comfortable  Respiratory system: Mostly clear bilaterally, occasional rhonchi. Respiratory effort normal. Cardiovascular system: S1 & S2 heard, RRR. No JVD, murmurs,  rubs, gallops or clicks. No pedal edema. Gastrointestinal system: Abdomen is nondistended, soft and nontender. No organomegaly or masses felt. Normal bowel sounds heard. Central nervous system: no focal neurological deficits. Extremities: Symmetric 5 x 5 power. Skin: No rashes, lesions or ulcers Psychiatry: Sleeping but wakes up to voice, answers questions appropriately    Data Reviewed: I have personally reviewed following  labs and imaging studies  CBC: Recent Labs  Lab 12/24/19 1703 12/24/19 2202 12/26/19 0403 12/26/19 1648 12/27/19 0621 12/29/19 1037 12/30/19 0453  WBC 6.7   < > 6.4 9.8 6.8 4.8 5.0  NEUTROABS 6.4  --   --   --   --   --   --   HGB 11.7*   < > 10.4* 11.6* 10.9* 11.2* 10.9*  HCT 39.6   < > 33.2* 37.3* 35.9* 35.1* 34.8*  MCV 96.4   < > 92.7 92.8 94.2 92.4 91.8  PLT 226   < > 206 254 221 207 208   < > = values in this interval not displayed.   Basic Metabolic Panel: Recent Labs  Lab 12/25/19 0841 12/25/19 0841 12/25/19 1311 12/25/19 1655 12/26/19 0403 12/27/19 0621 12/28/19 0556 12/29/19 1037 12/30/19 0453  NA 138   < >  --   --  138 145 146* 144 141  K 3.3*   < >  --   --  3.5 3.9 3.3* 3.2* 4.0  CL 95*   < >  --   --  92* 94* 91* 99 99  CO2 34*   < >  --   --  35* 41* 40* 37* 33*  GLUCOSE 139*   < >  --   --  145* 99 91 140* 115*  BUN 15   < >  --   --  22* 25* 30* 30* 22*  CREATININE 0.61   < >  --   --  0.64 0.59* 0.71 0.78 0.75  CALCIUM 8.2*   < >  --   --  8.3* 8.8* 9.1 8.6* 8.7*  MG 2.1  --  2.1 2.0 2.0 2.1  --   --   --   PHOS 2.1*  --  2.9 3.4 3.0 5.6*  --   --   --    < > = values in this interval not displayed.   GFR: Estimated Creatinine Clearance: 93.9 mL/min (by C-G formula based on SCr of 0.75 mg/dL). Liver Function Tests: Recent Labs  Lab 12/24/19 1703 12/25/19 0841 12/30/19 0453  AST 26 19 20   ALT 19 14 22   ALKPHOS 82 58 52  BILITOT 0.6 0.6 0.7  PROT 7.1 5.2* 5.5*  ALBUMIN 3.4* 2.5* 2.7*   No results for input(s): LIPASE, AMYLASE in the last 168 hours. Recent Labs  Lab 12/24/19 1703 12/25/19 0841 12/29/19 1112  AMMONIA 56* 30 43*   Coagulation Profile: No results for input(s): INR, PROTIME in the last 168 hours. Cardiac Enzymes: No results for input(s): CKTOTAL, CKMB, CKMBINDEX, TROPONINI in the last 168 hours. BNP (last 3 results) No results for input(s): PROBNP in the last 8760 hours. HbA1C: No results for input(s): HGBA1C in the  last 72 hours. CBG: Recent Labs  Lab 12/26/19 0416 12/26/19 0726 12/26/19 1132 12/27/19 0729 12/27/19 1134  GLUCAP 140* 109* 152* 89 93   Lipid Profile: No results for input(s): CHOL, HDL, LDLCALC, TRIG, CHOLHDL, LDLDIRECT in the last 72 hours. Thyroid Function Tests: No results for input(s): TSH, T4TOTAL, FREET4, T3FREE, THYROIDAB in the last  72 hours. Anemia Panel: No results for input(s): VITAMINB12, FOLATE, FERRITIN, TIBC, IRON, RETICCTPCT in the last 72 hours. Sepsis Labs: Recent Labs  Lab 12/24/19 1703 12/24/19 1830 12/24/19 2202 12/25/19 0209 12/25/19 0841  PROCALCITON  --   --   --   --  <0.10  LATICACIDVEN 1.7 3.2* 3.6* 3.6*  --     Recent Results (from the past 240 hour(s))  Respiratory Panel by RT PCR (Flu A&B, Covid) - Nasopharyngeal Swab     Status: None   Collection Time: 12/24/19  3:38 PM   Specimen: Nasopharyngeal Swab  Result Value Ref Range Status   SARS Coronavirus 2 by RT PCR NEGATIVE NEGATIVE Final    Comment: (NOTE) SARS-CoV-2 target nucleic acids are NOT DETECTED. The SARS-CoV-2 RNA is generally detectable in upper respiratoy specimens during the acute phase of infection. The lowest concentration of SARS-CoV-2 viral copies this assay can detect is 131 copies/mL. A negative result does not preclude SARS-Cov-2 infection and should not be used as the sole basis for treatment or other patient management decisions. A negative result may occur with  improper specimen collection/handling, submission of specimen other than nasopharyngeal swab, presence of viral mutation(s) within the areas targeted by this assay, and inadequate number of viral copies (<131 copies/mL). A negative result must be combined with clinical observations, patient history, and epidemiological information. The expected result is Negative. Fact Sheet for Patients:  PinkCheek.be Fact Sheet for Healthcare Providers:   GravelBags.it This test is not yet ap proved or cleared by the [place] FDA and  has been authorized for detection and/or diagnosis of SARS-CoV-2 by FDA under an Emergency Use Authorization (EUA). This EUA will remain  in effect (meaning this test can be used) for the duration of the COVID-19 declaration under Section 564(b)(1) of the Act, 21 U.S.C. section 360bbb-3(b)(1), unless the authorization is terminated or revoked sooner.    Influenza A by PCR NEGATIVE NEGATIVE Final   Influenza B by PCR NEGATIVE NEGATIVE Final    Comment: (NOTE) The Xpert Xpress SARS-CoV-2/FLU/RSV assay is intended as an aid in  the diagnosis of influenza from Nasopharyngeal swab specimens and  should not be used as a sole basis for treatment. Nasal washings and  aspirates are unacceptable for Xpert Xpress SARS-CoV-2/FLU/RSV  testing. Fact Sheet for Patients: PinkCheek.be Fact Sheet for Healthcare Providers: GravelBags.it This test is not yet approved or cleared by the [place] FDA and  has been authorized for detection and/or diagnosis of SARS-CoV-2 by  FDA under an Emergency Use Authorization (EUA). This EUA will remain  in effect (meaning this test can be used) for the duration of the  Covid-19 declaration under Section 564(b)(1) of the Act, 21  U.S.C. section 360bbb-3(b)(1), unless the authorization is  terminated or revoked. Performed at [facility], [address] [place], [place] [address]   Blood culture (routine x 2)     Status: None   Collection Time: 12/24/19  5:04 PM   Specimen: BLOOD LEFT HAND  Result Value Ref Range Status   Specimen Description BLOOD LEFT HAND  Final   Special Requests   Final    BOTTLES DRAWN AEROBIC AND ANAEROBIC Blood Culture adequate volume   Culture   Final    NO GROWTH 5 DAYS Performed at [facility], [address], [place], [place] [address]    Report Status  12/29/2019 FINAL  Final  Blood culture (routine x 2)     Status: None   Collection Time: 12/24/19  5:05 PM   Specimen: BLOOD LEFT HAND  Result Value Ref Range Status   Specimen Description BLOOD LEFT HAND  Final   Special Requests   Final    BOTTLES DRAWN AEROBIC AND ANAEROBIC Blood Culture adequate volume   Culture   Final    NO GROWTH 5 DAYS Performed at [facility], [address], [place], [place] [address]    Report Status 12/29/2019 FINAL  Final  MRSA PCR Screening     Status: None   Collection Time: 12/24/19  8:59 PM   Specimen: Nasopharyngeal  Result Value Ref Range Status   MRSA by PCR NEGATIVE NEGATIVE Final    Comment:        The GeneXpert MRSA Assay (FDA approved for NASAL specimens only), is one component of a comprehensive MRSA colonization surveillance program. It is not intended to diagnose MRSA infection nor to guide or monitor treatment for MRSA infections. Performed at [facility], [address], [place], [place] [address]   Culture, respiratory (non-expectorated)     Status: None   Collection Time: 12/25/19  8:21 AM   Specimen: Tracheal Aspirate; Respiratory  Result Value Ref Range Status   Specimen Description   Final    TRACHEAL ASPIRATE COLLECTED BY RT Performed at [facility], [address], [place], [place] [address]    Special Requests   Final    NONE Performed at [facility], [address] [place], [place] [address]    Gram Stain   Final    FEW WBC PRESENT, PREDOMINANTLY PMN NO ORGANISMS SEEN    Culture   Final    RARE Consistent with normal respiratory flora. Performed at [facility], [address], [place], [place] [address]    Report Status 12/27/2019 FINAL  Final  MRSA PCR Screening     Status: None   Collection Time: 12/25/19  9:11 AM   Specimen: Nasal Mucosa; Nasopharyngeal  Result Value Ref Range Status   MRSA by PCR NEGATIVE NEGATIVE Final    Comment:        The GeneXpert MRSA Assay (FDA approved for NASAL  specimens only), is one component of a comprehensive MRSA colonization surveillance program. It is not intended to diagnose MRSA infection nor to guide or monitor treatment for MRSA infections. Performed at [facility], [address], [place], [place] [address]   Culture, Urine     Status: None   Collection Time: 12/25/19  9:12 AM   Specimen: Urine, Clean Catch  Result Value Ref Range Status   Specimen Description   Final    URINE, CLEAN CATCH Performed at [facility], [address], [place], [place] [address]    Special Requests   Final    NONE Performed at [facility], [address] [place], [place] [address]    Culture   Final    NO GROWTH Performed at [facility], [address], [place], [place] [address]    Report Status 12/26/2019 FINAL  Final         Radiology Studies: DG CHEST PORT 1 VIEW  Result Date: 12/28/2019 CLINICAL DATA:  Hypoxia EXAM: PORTABLE CHEST 1 VIEW COMPARISON:  Chest radiograph 12/26/2019 FINDINGS: The lungs are hyperinflated with diffuse interstitial prominence. No focal airspace consolidation or pulmonary edema. No pleural effusion or pneumothorax. Normal cardiomediastinal contours. IMPRESSION: Findings of COPD without acute airspace disease. Electronically Signed   By: Deatra Robinson M.D.   On: 12/28/2019 19:20  Scheduled Meds: . aspirin EC  81 mg Oral QPM  . atorvastatin  20 mg Oral QPM  . budesonide (PULMICORT) nebulizer solution  0.5 mg Nebulization BID  . Chlorhexidine Gluconate Cloth  6 each Topical Daily  . enoxaparin (LOVENOX) injection  40 mg Subcutaneous Q24H  . FLUoxetine  40 mg Oral Daily  . folic acid  1 mg Oral Daily  . ipratropium-albuterol  3 mL Nebulization TID  . mouth rinse  15 mL Mouth Rinse BID  . metoprolol tartrate  25 mg Oral BID  . multivitamin with minerals  1 tablet Oral Daily  . OLANZapine  10 mg Oral QHS  . pantoprazole  40 mg Oral Daily  . predniSONE  40 mg Oral Q breakfast  . thiamine   100 mg Oral Daily   Or  . thiamine  100 mg Intravenous Daily  . valproic acid  500 mg Oral TID   Continuous Infusions: . 0.9 % NaCl with KCl 40 mEq / L 75 mL/hr (12/29/19 1827)  . dexmedetomidine (PRECEDEX) IV infusion Stopped (12/30/19 1030)  . lactated ringers 10 mL/hr at 12/25/19 1208  . piperacillin-tazobactam (ZOSYN)  IV 3.375 g (12/30/19 1542)     LOS: 6 days    Time spent:    Erick Blinks, MD [facility]   If 7PM-7AM, please contact night-coverage [email or web address]  12/30/2019, 4:01 PM

## 2019-12-31 ENCOUNTER — Inpatient Hospital Stay (HOSPITAL_COMMUNITY): Payer: Medicare Other

## 2019-12-31 LAB — BLOOD GAS, ARTERIAL
Acid-Base Excess: 8.2 mmol/L — ABNORMAL HIGH (ref 0.0–2.0)
Bicarbonate: 31.3 mmol/L — ABNORMAL HIGH (ref 20.0–28.0)
FIO2: 28
O2 Saturation: 94.4 %
Patient temperature: 36.7
pCO2 arterial: 53.6 mmHg — ABNORMAL HIGH (ref 32.0–48.0)
pH, Arterial: 7.407 (ref 7.350–7.450)
pO2, Arterial: 72.9 mmHg — ABNORMAL LOW (ref 83.0–108.0)

## 2019-12-31 MED ORDER — SODIUM CHLORIDE 0.9 % IV SOLN
INTRAVENOUS | Status: DC | PRN
  Administered 2019-12-31: 250 mL via INTRAVENOUS

## 2019-12-31 MED ORDER — DIVALPROEX SODIUM ER 500 MG PO TB24
1500.0000 mg | ORAL_TABLET | Freq: Every day | ORAL | Status: DC
  Administered 2019-12-31 – 2020-01-09 (×9): 1500 mg via ORAL
  Filled 2019-12-31 (×9): qty 3

## 2019-12-31 NOTE — Progress Notes (Signed)
Pt refused all nightly medicine except IV medication. Pt can tell me his name and that he is at [facility] but does not know the day or situation. Pt has had tremors since I arrived to my shift but has gotten worse. He has been fidgety all night pulling at monitoring equipment and has been anxious. Pt is having visual hallucinations telling me "Don't you see that bat running back and forth at the foot of the bed down there" 1mg  of Ativan has been given that was ordered PRN to help patient relax.

## 2019-12-31 NOTE — Progress Notes (Addendum)
PROGRESS NOTE    Jeffrey [name]  HWE:[id number] DOB: December 31, 1960 DOA: 12/24/2019 PCP: Gareth Morgan, MD    Brief Narrative:  59 year old male with history of COPD is chronically on 3 to 4 L of oxygen, admitted with worsening shortness of breath.  Felt to be related to COPD exacerbation.  He was intubated in the emergency room and admitted to the ICU.  Fortunately, he was able to extubate on 2/2.  Hospital course further complicated by development of acute metabolic encephalopathy.  Etiology is unclear, but may be related to high-dose steroids.  Is currently on Precedex infusion.  Steroids are being tapered.   Assessment & Plan:   Active Problems:   Bipolar disorder with severe depression (HCC)   Essential hypertension   COPD with acute exacerbation (HCC)   Acute on chronic respiratory failure with hypercapnia (HCC)   Acute on chronic respiratory failure with hypoxia and hypercapnia (HCC)   Lobar pneumonia (HCC)   Acute metabolic encephalopathy   Acute on chronic diastolic CHF (congestive heart failure) (HCC)   Aspiration pneumonitis (HCC)   1. Acute on chronic respiratory failure with hypoxia.  Secondary to COPD exacerbation and aspiration pneumonia.  Patient was intubated on admission, but was able to extubate 2/2.  Overall respiratory status appears to be stable.  Overnight, oxygen was bumped up to 5 L.  Since he is more confused today, will check ABG and check chest x-ray.  Discontinue IV fluids. 2. COPD exacerbation.  Wean prednisone.  Continue bronchodilators.  Respiratory status appears to be stable at this time. 3. Aspiration pneumonia.  He will likely complete his course of antibiotics today.  He is on modified diet with thickened liquids. 4. Acute metabolic encephalopathy.  Patient developed significant agitation/delirium yesterday and required transfer to ICU for further management.  When he did not respond to Ativan, he was started on Precedex infusion.  Blood gas last night  showed mildly elevated PCO2, but pH is compensated.  Urinalysis unremarkable and ammonia is only minimally elevated.  He did not have any focal deficits.  Continue to wean off Precedex as tolerated.  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 5. Bipolar disorder.  Continue home psychotropic medications including olanzapine and Depakote 6. Hypertension.  Stable on metoprolol.   DVT prophylaxis: Lovenox Code Status: Full code Family Communication: Discussed with daughter over the phone who is the POA Disposition Plan: Discharge to skilled nursing facility once mental status has stabilized off of Precedex infusion   Consultants:   Pulmonology  Procedures:   Intubation  Antimicrobials:   Zosyn 1/31> 2/7   Subjective: Patient became agitated overnight was hallucinating and refusing medications.  Was restarted on Precedex infusion overnight.  This morning, he appears calm.  Objective: Vitals:   12/31/19 0415 12/31/19 0500 12/31/19 0807 12/31/19 0811  BP: 134/70     Pulse: 93     Resp: (!) 23     Temp:   98 F (36.7 C)   TempSrc:   Oral   SpO2: 96%   94%  Weight:  67.2 kg    Height:        Intake/Output Summary (Last 24 hours) at 12/31/2019 0933 Last data filed at 12/31/2019 0419 Gross per 24 hour  Intake 1691.72 ml  Output 1000 ml  Net 691.72 ml  Filed Weights   12/28/19 0530 12/29/19 0500 12/31/19 0500  Weight: 67 kg 66.8 kg 67.2 kg    Examination:  General exam: Alert, awake, no distress Respiratory system: Crackles at bases. Respiratory effort normal. Cardiovascular system:RRR. No murmurs, rubs, gallops. Gastrointestinal system: Abdomen is nondistended, soft and nontender. No organomegaly or masses felt. Normal bowel sounds heard. Central nervous system:  No focal neurological  deficits. Extremities: No C/C/E, +pedal pulses Skin: No rashes, lesions or ulcers Psychiatry: Pleasant, mildly confused   Data Reviewed: I have personally reviewed following labs and imaging studies  CBC: Recent Labs  Lab 12/24/19 1703 12/24/19 2202 12/26/19 0403 12/26/19 1648 12/27/19 0621 12/29/19 1037 12/30/19 0453  WBC 6.7   < > 6.4 9.8 6.8 4.8 5.0  NEUTROABS 6.4  --   --   --   --   --   --   HGB 11.7*   < > 10.4* 11.6* 10.9* 11.2* 10.9*  HCT 39.6   < > 33.2* 37.3* 35.9* 35.1* 34.8*  MCV 96.4   < > 92.7 92.8 94.2 92.4 91.8  PLT 226   < > 206 254 221 207 208   < > = values in this interval not displayed.   Basic Metabolic Panel: Recent Labs  Lab 12/25/19 0841 12/25/19 0841 12/25/19 1311 12/25/19 1655 12/26/19 0403 12/27/19 0621 12/28/19 0556 12/29/19 1037 12/30/19 0453  NA 138   < >  --   --  138 145 146* 144 141  K 3.3*   < >  --   --  3.5 3.9 3.3* 3.2* 4.0  CL 95*   < >  --   --  92* 94* 91* 99 99  CO2 34*   < >  --   --  35* 41* 40* 37* 33*  GLUCOSE 139*   < >  --   --  145* 99 91 140* 115*  BUN 15   < >  --   --  22* 25* 30* 30* 22*  CREATININE 0.61   < >  --   --  0.64 0.59* 0.71 0.78 0.75  CALCIUM 8.2*   < >  --   --  8.3* 8.8* 9.1 8.6* 8.7*  MG 2.1  --  2.1 2.0 2.0 2.1  --   --   --   PHOS 2.1*  --  2.9 3.4 3.0 5.6*  --   --   --    < > = values in this interval not displayed.   GFR: Estimated Creatinine Clearance: 94.5 mL/min (by C-G formula based on SCr of 0.75 mg/dL). Liver Function Tests: Recent Labs  Lab 12/24/19 1703 12/25/19 0841 12/30/19 0453  AST 26 19 20   ALT 19 14 22   ALKPHOS 82 58 52  BILITOT 0.6 0.6 0.7  PROT 7.1 5.2* 5.5*  ALBUMIN 3.4* 2.5* 2.7*   No results for input(s): LIPASE, AMYLASE in the last 168 hours. Recent Labs  Lab 12/24/19 1703 12/25/19 0841 12/29/19 1112  AMMONIA 56* 30 43*   Coagulation Profile: No results for input(s): INR, PROTIME in the last 168 hours. Cardiac Enzymes: No results for input(s):  CKTOTAL, CKMB, CKMBINDEX, TROPONINI in the last 168 hours. BNP (last 3 results) No results for input(s): PROBNP in the last 8760 hours. HbA1C: No results for input(s): HGBA1C in the last 72 hours. CBG: Recent Labs  Lab 12/26/19 0416 12/26/19 0726 12/26/19 1132 12/27/19 0729 12/27/19 1134  GLUCAP 140* 109* 152* 89 93   Lipid Profile: No  results for input(s): CHOL, HDL, LDLCALC, TRIG, CHOLHDL, LDLDIRECT in the last 72 hours. Thyroid Function Tests: No results for input(s): TSH, T4TOTAL, FREET4, T3FREE, THYROIDAB in the last 72 hours. Anemia Panel: No results for input(s): VITAMINB12, FOLATE, FERRITIN, TIBC, IRON, RETICCTPCT in the last 72 hours. Sepsis Labs: Recent Labs  Lab 12/24/19 1703 12/24/19 1830 12/24/19 2202 12/25/19 0209 12/25/19 0841  PROCALCITON  --   --   --   --  <0.10  LATICACIDVEN 1.7 3.2* 3.6* 3.6*  --     Recent Results (from the past 240 hour(s))  Respiratory Panel by RT PCR (Flu A&B, Covid) - Nasopharyngeal Swab     Status: None   Collection Time: 12/24/19  3:38 PM   Specimen: Nasopharyngeal Swab  Result Value Ref Range Status   SARS Coronavirus 2 by RT PCR NEGATIVE NEGATIVE Final    Comment: (NOTE) SARS-CoV-2 target nucleic acids are NOT DETECTED. The SARS-CoV-2 RNA is generally detectable in upper respiratoy specimens during the acute phase of infection. The lowest concentration of SARS-CoV-2 viral copies this assay can detect is 131 copies/mL. A negative result does not preclude SARS-Cov-2 infection and should not be used as the sole basis for treatment or other patient management decisions. A negative result may occur with  improper specimen collection/handling, submission of specimen other than nasopharyngeal swab, presence of viral mutation(s) within the areas targeted by this assay, and inadequate number of viral copies (<131 copies/mL). A negative result must be combined with clinical observations, patient history, and epidemiological  information. The expected result is Negative. Fact Sheet for Patients:  https://www.moore.com/ Fact Sheet for Healthcare Providers:  https://www.young.biz/ This test is not yet ap proved or cleared by the [place] FDA and  has been authorized for detection and/or diagnosis of SARS-CoV-2 by FDA under an Emergency Use Authorization (EUA). This EUA will remain  in effect (meaning this test can be used) for the duration of the COVID-19 declaration under Section 564(b)(1) of the Act, 21 U.S.C. section 360bbb-3(b)(1), unless the authorization is terminated or revoked sooner.    Influenza A by PCR NEGATIVE NEGATIVE Final   Influenza B by PCR NEGATIVE NEGATIVE Final    Comment: (NOTE) The Xpert Xpress SARS-CoV-2/FLU/RSV assay is intended as an aid in  the diagnosis of influenza from Nasopharyngeal swab specimens and  should not be used as a sole basis for treatment. Nasal washings and  aspirates are unacceptable for Xpert Xpress SARS-CoV-2/FLU/RSV  testing. Fact Sheet for Patients: https://www.moore.com/ Fact Sheet for Healthcare Providers: https://www.young.biz/ This test is not yet approved or cleared by the [place] FDA and  has been authorized for detection and/or diagnosis of SARS-CoV-2 by  FDA under an Emergency Use Authorization (EUA). This EUA will remain  in effect (meaning this test can be used) for the duration of the  Covid-19 declaration under Section 564(b)(1) of the Act, 21  U.S.C. section 360bbb-3(b)(1), unless the authorization is  terminated or revoked. Performed at [facility], [address], [place], [place] [address]   Blood culture (routine x 2)     Status: None   Collection Time: 12/24/19  5:04 PM   Specimen: BLOOD LEFT HAND  Result Value Ref Range Status   Specimen Description BLOOD LEFT HAND  Final   Special Requests   Final    BOTTLES DRAWN AEROBIC AND ANAEROBIC Blood  Culture adequate volume   Culture   Final    NO GROWTH 5 DAYS Performed at [facility], [address], [place],  [place] [address]    Report Status 12/29/2019 FINAL  Final  Blood culture (routine x 2)     Status: None   Collection Time: 12/24/19  5:05 PM   Specimen: BLOOD LEFT HAND  Result Value Ref Range Status   Specimen Description BLOOD LEFT HAND  Final   Special Requests   Final    BOTTLES DRAWN AEROBIC AND ANAEROBIC Blood Culture adequate volume   Culture   Final    NO GROWTH 5 DAYS Performed at [facility], [address] [place], [place] [address]    Report Status 12/29/2019 FINAL  Final  MRSA PCR Screening     Status: None   Collection Time: 12/24/19  8:59 PM   Specimen: Nasopharyngeal  Result Value Ref Range Status   MRSA by PCR NEGATIVE NEGATIVE Final    Comment:        The GeneXpert MRSA Assay (FDA approved for NASAL specimens only), is one component of a comprehensive MRSA colonization surveillance program. It is not intended to diagnose MRSA infection nor to guide or monitor treatment for MRSA infections. Performed at [facility], [address] [place], [place] [address]   Culture, respiratory (non-expectorated)     Status: None   Collection Time: 12/25/19  8:21 AM   Specimen: Tracheal Aspirate; Respiratory  Result Value Ref Range Status   Specimen Description   Final    TRACHEAL ASPIRATE COLLECTED BY RT Performed at [facility], [address] [place], [place] [address]    Special Requests   Final    NONE Performed at [facility], [address] [place], [place] [address]    Gram Stain   Final    FEW WBC PRESENT, PREDOMINANTLY PMN NO ORGANISMS SEEN    Culture   Final    RARE Consistent with normal respiratory flora. Performed at [facility], [place] [address] [place], [place] [address]    Report Status 12/27/2019 FINAL  Final  MRSA PCR Screening     Status: None   Collection Time: 12/25/19  9:11 AM   Specimen: Nasal Mucosa;  Nasopharyngeal  Result Value Ref Range Status   MRSA by PCR NEGATIVE NEGATIVE Final    Comment:        The GeneXpert MRSA Assay (FDA approved for NASAL specimens only), is one component of a comprehensive MRSA colonization surveillance program. It is not intended to diagnose MRSA infection nor to guide or monitor treatment for MRSA infections. Performed at [facility], [address] [place], [place] [address]   Culture, Urine     Status: None   Collection Time: 12/25/19  9:12 AM   Specimen: Urine, Clean Catch  Result Value Ref Range Status   Specimen Description   Final    URINE, CLEAN CATCH Performed at [facility], [address], [place], [place] [address]    Special Requests   Final    NONE Performed at [facility], [address], [place], [place] [address]    Culture   Final    NO GROWTH Performed at [facility], [place] [address], [place], [place] [address]    Report Status 12/26/2019 FINAL  Final         Radiology Studies: No results found.      Scheduled Meds: . aspirin EC  81 mg Oral QPM  . atorvastatin  20 mg Oral QPM  . budesonide (PULMICORT) nebulizer solution  0.5 mg Nebulization BID  . Chlorhexidine Gluconate Cloth  6 each  Topical Daily  . enoxaparin (LOVENOX) injection  40 mg Subcutaneous Q24H  . FLUoxetine  40 mg Oral Daily  . folic acid  1 mg Oral Daily  . ipratropium-albuterol  3 mL Nebulization TID  . mouth rinse  15 mL Mouth Rinse BID  . metoprolol tartrate  25 mg Oral BID  . multivitamin with minerals  1 tablet Oral Daily  . OLANZapine  10 mg Oral QHS  . pantoprazole  40 mg Oral Daily  . predniSONE  40 mg Oral Q breakfast  . thiamine  100 mg Oral Daily   Or  . thiamine  100 mg Intravenous Daily  . valproic acid  500 mg Oral TID   Continuous Infusions: . dexmedetomidine (PRECEDEX) IV infusion 0.6 mcg/kg/hr (12/31/19 8921)  . lactated ringers 10 mL/hr at 12/25/19 1208  . piperacillin-tazobactam (ZOSYN)  IV 3.375 g (12/31/19  0547)     LOS: 7 days    Time spent:    Erick Blinks, MD [facility]   If 7PM-7AM, please contact night-coverage [email or web address]  12/31/2019, 9:33 AM

## 2019-12-31 NOTE — Progress Notes (Signed)
Pt started mumbling out of his head saying things that didn't make sense pt became disoriented x4 patient trying to get out of bed and leave constantly. Precedex restarted after multiple attempts to reorient patient and calm him down.

## 2020-01-01 LAB — BASIC METABOLIC PANEL
Anion gap: 9 (ref 5–15)
BUN: 16 mg/dL (ref 6–20)
CO2: 32 mmol/L (ref 22–32)
Calcium: 8.5 mg/dL — ABNORMAL LOW (ref 8.9–10.3)
Chloride: 101 mmol/L (ref 98–111)
Creatinine, Ser: 0.65 mg/dL (ref 0.61–1.24)
GFR calc Af Amer: >60 mL/min (ref >60)
GFR calc non Af Amer: >60 mL/min (ref >60)
Glucose, Bld: 84 mg/dL (ref 70–99)
Potassium: 3.3 mmol/L — ABNORMAL LOW (ref 3.5–5.1)
Sodium: 142 mmol/L (ref 135–145)

## 2020-01-01 MED ORDER — PREDNISONE 20 MG PO TABS
20.0000 mg | ORAL_TABLET | Freq: Every day | ORAL | Status: DC
  Administered 2020-01-02 – 2020-01-04 (×3): 20 mg via ORAL
  Filled 2020-01-01 (×3): qty 1

## 2020-01-01 MED ORDER — TRAZODONE HCL 50 MG PO TABS
100.0000 mg | ORAL_TABLET | Freq: Every day | ORAL | Status: DC
  Administered 2020-01-01 – 2020-01-04 (×4): 100 mg via ORAL
  Filled 2020-01-01 (×4): qty 2

## 2020-01-01 MED ORDER — POTASSIUM CHLORIDE 10 MEQ/100ML IV SOLN
10.0000 meq | INTRAVENOUS | Status: AC
  Administered 2020-01-01 (×4): 10 meq via INTRAVENOUS
  Filled 2020-01-01 (×2): qty 100

## 2020-01-01 MED ORDER — HALOPERIDOL LACTATE 5 MG/ML IJ SOLN
5.0000 mg | Freq: Four times a day (QID) | INTRAMUSCULAR | Status: DC | PRN
  Administered 2020-01-01 (×2): 5 mg via INTRAVENOUS
  Filled 2020-01-01 (×2): qty 1

## 2020-01-01 NOTE — Progress Notes (Signed)
PROGRESS NOTE    Jeffrey [name]  AXE:[id number] DOB: 1961/08/13 DOA: 12/24/2019 PCP: Gareth Morgan, MD    Brief Narrative:  59 year old male with history of COPD is chronically on 3 to 4 L of oxygen, admitted with worsening shortness of breath.  Felt to be related to COPD exacerbation.  He was intubated in the emergency room and admitted to the ICU.  Fortunately, he was able to extubate on 2/2.  Hospital course further complicated by development of acute metabolic encephalopathy.  Etiology is unclear, but may be related to high-dose steroids.  Is currently on Precedex infusion.  Steroids are being tapered.   Assessment & Plan:   Active Problems:   Bipolar disorder with severe depression (HCC)   Essential hypertension   COPD with acute exacerbation (HCC)   Acute on chronic respiratory failure with hypercapnia (HCC)   Acute on chronic respiratory failure with hypoxia and hypercapnia (HCC)   Lobar pneumonia (HCC)   Acute metabolic encephalopathy   Acute on chronic diastolic CHF (congestive heart failure) (HCC)   Aspiration pneumonitis (HCC)   1. Acute on chronic respiratory failure with hypoxia.  Secondary to COPD exacerbation and aspiration pneumonia.  Patient was intubated on admission, but was able to extubate 2/2.  Overall respiratory status appears to be stable.  Currently, he is requiring 2 L which is his baseline.. 2. COPD exacerbation.  Wean prednisone.  Continue bronchodilators.  Respiratory status appears to be stable at this time. 3. Aspiration pneumonia.  He has completed his antibiotics in the hospital.  He is on modified diet with thickened liquids.  Speech therapy following 4. Acute metabolic encephalopathy.  Patient developed significant agitation/delirium and required transfer to ICU for further management.  When he did not respond to Ativan, he was started on Precedex infusion.  Blood gas showed mildly elevated PCO2, but pH was compensated indicating a chronic process.   Urinalysis unremarkable and ammonia is only minimally elevated and unlikely to be clinically significant.  He did not have any focal deficits.  He has a previous history of alcohol use.  It is unclear whether he has been recently drinking.  Patient appears to be more calm on Precedex and is able to wean off during the day.  He typically gets more agitated at night and is placed back on Precedex.  He is already on Depakote and olanzapine at night.  We will also add trazodone. 5. Bipolar disorder.  Continue home psychotropic medications including olanzapine and Depakote 6. Hypertension.  Stable on metoprolol.   DVT prophylaxis: Lovenox Code Status: Full code Family Communication: Discussed with daughter over the phone who is the POA Disposition Plan: Discharge to skilled nursing facility once mental status has stabilized off of Precedex infusion   Consultants:   Pulmonology  Procedures:   Intubation  Antimicrobials:   Zosyn 1/31> 2/7   Subjective: Was calm for most of the day yesterday but became agitated overnight and needed to be restarted on precedex. Currently he is calm and precedex has been turned off. He denies any shortness of breath or cough  Objective: Vitals:   01/01/20 0827 01/01/20 0853 01/01/20 1000 01/01/20 1117  BP:  (!) 147/77 127/80 (!) 137/56  Pulse:  67 61   Resp:   (!) 21 20  Temp:    98.3 F (36.8 C)  TempSrc:    Oral  SpO2: 93%  94%   Weight:      Height:        Intake/Output Summary (Last  24 hours) at 01/01/2020 1120 Last data filed at 01/01/2020 1032 Gross per 24 hour  Intake 478.16 ml  Output --  Net 478.16 ml   Filed Weights   12/29/19 0500 12/31/19 0500 01/01/20 0500  Weight: 66.8 kg 67.2 kg 68.5 kg    Examination:  General exam: Alert, awake, no distress Respiratory system: Clear to auscultation. Respiratory effort normal. Cardiovascular system:RRR. No murmurs, rubs, gallops. Gastrointestinal system: Abdomen is nondistended, soft and  nontender. No organomegaly or masses felt. Normal bowel sounds heard. Central nervous system: Alert and oriented. No focal neurological deficits. Extremities: No C/C/E, +pedal pulses Skin: No rashes, lesions or ulcers Psychiatry: calm, pleasant, answering questions appropriately    Data Reviewed: I have personally reviewed following labs and imaging studies  CBC: Recent Labs  Lab 12/26/19 0403 12/26/19 1648 12/27/19 0621 12/29/19 1037 12/30/19 0453  WBC 6.4 9.8 6.8 4.8 5.0  HGB 10.4* 11.6* 10.9* 11.2* 10.9*  HCT 33.2* 37.3* 35.9* 35.1* 34.8*  MCV 92.7 92.8 94.2 92.4 91.8  PLT 206 254 221 207 208   Basic Metabolic Panel: Recent Labs  Lab 12/25/19 1311 12/25/19 1655 12/26/19 0403 12/26/19 0403 12/27/19 0621 12/28/19 0556 12/29/19 1037 12/30/19 0453 01/01/20 0422  NA  --   --  138   < > 145 146* 144 141 142  K  --   --  3.5   < > 3.9 3.3* 3.2* 4.0 3.3*  CL  --   --  92*   < > 94* 91* 99 99 101  CO2  --   --  35*   < > 41* 40* 37* 33* 32  GLUCOSE  --   --  145*   < > 99 91 140* 115* 84  BUN  --   --  22*   < > 25* 30* 30* 22* 16  CREATININE  --   --  0.64   < > 0.59* 0.71 0.78 0.75 0.65  CALCIUM  --   --  8.3*   < > 8.8* 9.1 8.6* 8.7* 8.5*  MG 2.1 2.0 2.0  --  2.1  --   --   --   --   PHOS 2.9 3.4 3.0  --  5.6*  --   --   --   --    < > = values in this interval not displayed.   GFR: Estimated Creatinine Clearance: 96.3 mL/min (by C-G formula based on SCr of 0.65 mg/dL). Liver Function Tests: Recent Labs  Lab 12/30/19 0453  AST 20  ALT 22  ALKPHOS 52  BILITOT 0.7  PROT 5.5*  ALBUMIN 2.7*   No results for input(s): LIPASE, AMYLASE in the last 168 hours. Recent Labs  Lab 12/29/19 1112  AMMONIA 43*   Coagulation Profile: No results for input(s): INR, PROTIME in the last 168 hours. Cardiac Enzymes: No results for input(s): CKTOTAL, CKMB, CKMBINDEX, TROPONINI in the last 168 hours. BNP (last 3 results) No results for input(s): PROBNP in the last 8760  hours. HbA1C: No results for input(s): HGBA1C in the last 72 hours. CBG: Recent Labs  Lab 12/26/19 0416 12/26/19 0726 12/26/19 1132 12/27/19 0729 12/27/19 1134  GLUCAP 140* 109* 152* 89 93   Lipid Profile: No results for input(s): CHOL, HDL, LDLCALC, TRIG, CHOLHDL, LDLDIRECT in the last 72 hours. Thyroid Function Tests: No results for input(s): TSH, T4TOTAL, FREET4, T3FREE, THYROIDAB in the last 72 hours. Anemia Panel: No results for input(s): VITAMINB12, FOLATE, FERRITIN, TIBC, IRON, RETICCTPCT in the last  72 hours. Sepsis Labs: No results for input(s): PROCALCITON, LATICACIDVEN in the last 168 hours.  Recent Results (from the past 240 hour(s))  Respiratory Panel by RT PCR (Flu A&B, Covid) - Nasopharyngeal Swab     Status: None   Collection Time: 12/24/19  3:38 PM   Specimen: Nasopharyngeal Swab  Result Value Ref Range Status   SARS Coronavirus 2 by RT PCR NEGATIVE NEGATIVE Final    Comment: (NOTE) SARS-CoV-2 target nucleic acids are NOT DETECTED. The SARS-CoV-2 RNA is generally detectable in upper respiratoy specimens during the acute phase of infection. The lowest concentration of SARS-CoV-2 viral copies this assay can detect is 131 copies/mL. A negative result does not preclude SARS-Cov-2 infection and should not be used as the sole basis for treatment or other patient management decisions. A negative result may occur with  improper specimen collection/handling, submission of specimen other than nasopharyngeal swab, presence of viral mutation(s) within the areas targeted by this assay, and inadequate number of viral copies (<131 copies/mL). A negative result must be combined with clinical observations, patient history, and epidemiological information. The expected result is Negative. Fact Sheet for Patients:  https://www.moore.com/ Fact Sheet for Healthcare Providers:  https://www.young.biz/ This test is not yet ap proved or  cleared by the [place] FDA and  has been authorized for detection and/or diagnosis of SARS-CoV-2 by FDA under an Emergency Use Authorization (EUA). This EUA will remain  in effect (meaning this test can be used) for the duration of the COVID-19 declaration under Section 564(b)(1) of the Act, 21 U.S.C. section 360bbb-3(b)(1), unless the authorization is terminated or revoked sooner.    Influenza A by PCR NEGATIVE NEGATIVE Final   Influenza B by PCR NEGATIVE NEGATIVE Final    Comment: (NOTE) The Xpert Xpress SARS-CoV-2/FLU/RSV assay is intended as an aid in  the diagnosis of influenza from Nasopharyngeal swab specimens and  should not be used as a sole basis for treatment. Nasal washings and  aspirates are unacceptable for Xpert Xpress SARS-CoV-2/FLU/RSV  testing. Fact Sheet for Patients: https://www.moore.com/ Fact Sheet for Healthcare Providers: https://www.young.biz/ This test is not yet approved or cleared by the [place] FDA and  has been authorized for detection and/or diagnosis of SARS-CoV-2 by  FDA under an Emergency Use Authorization (EUA). This EUA will remain  in effect (meaning this test can be used) for the duration of the  Covid-19 declaration under Section 564(b)(1) of the Act, 21  U.S.C. section 360bbb-3(b)(1), unless the authorization is  terminated or revoked. Performed at [facility], [address] [place], [place] [address]   Blood culture (routine x 2)     Status: None   Collection Time: 12/24/19  5:04 PM   Specimen: BLOOD LEFT HAND  Result Value Ref Range Status   Specimen Description BLOOD LEFT HAND  Final   Special Requests   Final    BOTTLES DRAWN AEROBIC AND ANAEROBIC Blood Culture adequate volume   Culture   Final    NO GROWTH 5 DAYS Performed at [facility], [address], [place], [place] [address]    Report Status 12/29/2019 FINAL  Final  Blood culture (routine x 2)     Status: None   Collection  Time: 12/24/19  5:05 PM   Specimen: BLOOD LEFT HAND  Result Value Ref Range Status   Specimen Description BLOOD LEFT HAND  Final   Special Requests   Final    BOTTLES DRAWN AEROBIC AND ANAEROBIC Blood Culture adequate volume   Culture  Final    NO GROWTH 5 DAYS Performed at [facility], [address] [place], [place] [address]    Report Status 12/29/2019 FINAL  Final  MRSA PCR Screening     Status: None   Collection Time: 12/24/19  8:59 PM   Specimen: Nasopharyngeal  Result Value Ref Range Status   MRSA by PCR NEGATIVE NEGATIVE Final    Comment:        The GeneXpert MRSA Assay (FDA approved for NASAL specimens only), is one component of a comprehensive MRSA colonization surveillance program. It is not intended to diagnose MRSA infection nor to guide or monitor treatment for MRSA infections. Performed at [facility], [address], [place], [place] [address]   Culture, respiratory (non-expectorated)     Status: None   Collection Time: 12/25/19  8:21 AM   Specimen: Tracheal Aspirate; Respiratory  Result Value Ref Range Status   Specimen Description   Final    TRACHEAL ASPIRATE COLLECTED BY RT Performed at [facility], [address] [place], [place] [address]    Special Requests   Final    NONE Performed at [facility], [address] [place], [place] [address]    Gram Stain   Final    FEW WBC PRESENT, PREDOMINANTLY PMN NO ORGANISMS SEEN    Culture   Final    RARE Consistent with normal respiratory flora. Performed at [facility], [address], [place], [place] [address]    Report Status 12/27/2019 FINAL  Final  MRSA PCR Screening     Status: None   Collection Time: 12/25/19  9:11 AM   Specimen: Nasal Mucosa; Nasopharyngeal  Result Value Ref Range Status   MRSA by PCR NEGATIVE NEGATIVE Final    Comment:        The GeneXpert MRSA Assay (FDA approved for NASAL specimens only), is one component of a comprehensive MRSA colonization surveillance  program. It is not intended to diagnose MRSA infection nor to guide or monitor treatment for MRSA infections. Performed at [facility], [address] [place], [place] [address]   Culture, Urine     Status: None   Collection Time: 12/25/19  9:12 AM   Specimen: Urine, Clean Catch  Result Value Ref Range Status   Specimen Description   Final    URINE, CLEAN CATCH Performed at [facility], [address], [place], [place] [address]    Special Requests   Final    NONE Performed at [facility], [address], [place], [place] [address]    Culture   Final    NO GROWTH Performed at [facility], [address] [place], [place] [address]    Report Status 12/26/2019 FINAL  Final         Radiology Studies: DG CHEST PORT 1 VIEW  Result Date: 12/31/2019 CLINICAL DATA:  Respiratory failure. EXAM: PORTABLE CHEST 1 VIEW COMPARISON:  December 28, 2019. FINDINGS: The heart size and mediastinal contours are within normal limits. No pneumothorax is noted. Increased bibasilar opacities are noted concerning for pneumonia or edema. Small bilateral pleural effusions are noted. The visualized skeletal structures are unremarkable. IMPRESSION: Increased bibasilar opacities are noted concerning for pneumonia or edema. Small bilateral pleural effusions are noted. Electronically Signed   By: Lupita Raider M.D.   On: 12/31/2019 09:50        Scheduled Meds: . aspirin EC  81 mg Oral QPM  . atorvastatin  20 mg Oral QPM  . budesonide (  PULMICORT) nebulizer solution  0.5 mg Nebulization BID  . Chlorhexidine Gluconate Cloth  6 each Topical Daily  . divalproex  1,500 mg Oral QHS  . enoxaparin (LOVENOX) injection  40 mg Subcutaneous Q24H  . FLUoxetine  40 mg Oral Daily  . folic acid  1 mg Oral Daily  . ipratropium-albuterol  3 mL Nebulization TID  . mouth rinse  15 mL Mouth Rinse BID  . metoprolol tartrate  25 mg Oral BID  . multivitamin with minerals  1 tablet Oral Daily  . OLANZapine  10 mg  Oral QHS  . pantoprazole  40 mg Oral Daily  . [START ON 01/02/2020] predniSONE  20 mg Oral Q breakfast  . thiamine  100 mg Oral Daily   Or  . thiamine  100 mg Intravenous Daily  . traZODone  100 mg Oral QHS   Continuous Infusions: . sodium chloride 10 mL/hr at 01/01/20 1032  . dexmedetomidine (PRECEDEX) IV infusion Stopped (01/01/20 0901)  . lactated ringers 10 mL/hr at 12/25/19 1208     LOS: 8 days    Time spent: 74mins    Kathie Dike, MD [facility]   If 7PM-7AM, please contact night-coverage [email or web address]  01/01/2020, 11:20 AM

## 2020-01-01 NOTE — NC FL2 (Signed)
[place] MEDICAID FL2 LEVEL OF CARE SCREENING TOOL     IDENTIFICATION  Patient Name: Jeffrey [name] Birthdate: Jun 08, 1961 Sex: male Admission Date (Current Location): 12/24/2019  [facility] and [place] Number:  [employer] and Address:  [facility],  [address] [place] [address]      Provider Number: [id number]  Attending Physician Name and Address:  Erick Blinks, MD  Relative Name and Phone Number:  Sharon Bente PH:[phone]    Current Level of Care: Hospital Recommended Level of Care: Skilled Nursing Facility Prior Approval Number:    Date Approved/Denied: 01/16/19 PASRR Number: [id number]  Discharge Plan: SNF    Current Diagnoses: Patient Active Problem List   Diagnosis Date Noted  . Acute on chronic diastolic CHF (congestive heart failure) (HCC) 12/26/2019  . Aspiration pneumonitis (HCC) 12/26/2019  . Acute on chronic respiratory failure with hypoxia and hypercapnia (HCC) 12/25/2019  . Lobar pneumonia (HCC) 12/25/2019  . Acute metabolic encephalopathy 12/25/2019  . Acute on chronic respiratory failure with hypercapnia (HCC) 12/24/2019  . COPD with acute exacerbation (HCC) 10/12/2019  . Respiratory failure with hypoxia (HCC) 04/30/2019  . Acute on chronic respiratory failure with hypoxia (HCC) 04/30/2019  . Bronchiectasis (HCC) 03/24/2019  . Fever 03/24/2019  . Cough   . Dysphagia   . Palliative care by specialist   . Goals of care, counseling/discussion   . Hepatic encephalopathy (HCC)   . COPD exacerbation (HCC) 09/16/2018  . Essential hypertension 09/16/2018  . GERD (gastroesophageal reflux disease) 09/16/2018  . Hypoxia   . Chronic diastolic CHF (congestive heart failure) (HCC)   . Bipolar disorder with severe depression (HCC) 02/01/2018  . Alcohol use disorder, severe, dependence (HCC) 02/01/2018  . Cannabis use disorder, severe, dependence (HCC) 02/01/2018  . MDD (major depressive disorder), recurrent episode, severe  (HCC) 01/31/2018  . Internal and external bleeding hemorrhoids   . Rectal bleeding   . Iron deficiency anemia due to chronic blood loss 03/09/2017  . Anemia   . GIB (gastrointestinal bleeding) 02/07/2017  . Peptic ulcer disease   . Bipolar disorder (HCC)   . 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 H/O: depression 08/26/2016  . Dyslipidemia 08/26/2016  . Tobacco use disorder 08/26/2016  . [name] (peripheral vascular disease) (HCC) 06/18/2015  . Chronic diarrhea 05/02/2013  . Hematochezia 05/02/2013  . Abnormal weight loss 05/02/2013  . Abdominal pain, periumbilical 05/02/2013    Orientation RESPIRATION BLADDER Height & Weight     Self, Time, Situation, Place  O2(2L min) Continent Weight: 151 lb 0.2 oz (68.5 kg) Height:  5\' 10"  (177.8 cm)  BEHAVIORAL SYMPTOMS/MOOD NEUROLOGICAL BOWEL NUTRITION STATUS      Continent    AMBULATORY STATUS COMMUNICATION OF NEEDS Skin   Extensive Assist Verbally Other (Comment)(Ecchymosis bilateral arm; skin associated moisture on scrotum and perineum area.)                       Personal Care Assistance Level of Assistance  Bathing, Feeding, Dressing Bathing Assistance: Limited assistance Feeding assistance: Independent Dressing Assistance: Limited assistance     Functional Limitations Info  Sight, Hearing, Speech Sight Info: Adequate Hearing Info: Adequate Speech Info: Adequate    SPECIAL CARE FACTORS FREQUENCY  PT (By licensed PT), OT (By licensed OT)     PT Frequency: 3x min weekly OT Frequency: 3x min weekly            Contractures Contractures Info: Not present    Additional Factors Info  Code  Status, Allergies, Psychotropic Code Status Info: Full code Allergies Info: Augmentin (Amoxicillin-pot Clavulanate) Ace Inhibitors Psychotropic Info: divalproex (DEPAKOTE ER) 24 hr tablet 1,500 mg, FLUoxetine (PROZAC) capsule 40 mg,traZODone (DESYREL) tablet 100 mg         Current Medications (01/01/2020):  This is the current hospital  active medication list Current Facility-Administered Medications  Medication Dose Route Frequency Provider Last Rate Last Admin  . 0.9 %  sodium chloride infusion   Intravenous PRN Erick Blinks, MD 10 mL/hr at 01/01/20 1032 Rate Verify at 01/01/20 1032  . acetaminophen (TYLENOL) tablet 650 mg  650 mg Oral Q6H PRN Coralyn Helling, MD       Or  . acetaminophen (TYLENOL) suppository 650 mg  650 mg Rectal Q6H PRN Craige Cotta, Vineet, MD      . albuterol (PROVENTIL) (2.5 MG/3ML) 0.083% nebulizer solution 2.5 mg  2.5 mg Nebulization Q4H PRN Coralyn Helling, MD      . aspirin EC tablet 81 mg  81 mg Oral QPM Sood, Vineet, MD   81 mg at 12/31/19 1700  . atorvastatin (LIPITOR) tablet 20 mg  20 mg Oral QPM Coralyn Helling, MD   20 mg at 12/31/19 1700  . budesonide (PULMICORT) nebulizer solution 0.5 mg  0.5 mg Nebulization BID Coralyn Helling, MD   0.5 mg at 01/01/20 0827  . Chlorhexidine Gluconate Cloth 2 % PADS 6 each  6 each Topical Daily Coralyn Helling, MD   6 each at 01/01/20 [phone]  . dexmedetomidine (PRECEDEX) 400 MCG/100ML (4 mcg/mL) infusion  0.4-1.2 mcg/kg/hr Intravenous Titrated Tobey Grim, NP   Stopped at 01/01/20 0901  . divalproex (DEPAKOTE ER) 24 hr tablet 1,500 mg  1,500 mg Oral QHS Erick Blinks, MD   1,500 mg at 12/31/19 2101  . enoxaparin (LOVENOX) injection 40 mg  40 mg Subcutaneous Q24H Coralyn Helling, MD   40 mg at 12/31/19 2059  . FLUoxetine (PROZAC) capsule 40 mg  40 mg Oral Daily Coralyn Helling, MD   40 mg at 01/01/20 0854  . folic acid (FOLVITE) tablet 1 mg  1 mg Oral Daily Coralyn Helling, MD   1 mg at 01/01/20 0853  . food thickener (THICK IT) powder   Oral PRN Erick Blinks, MD      . guaiFENesin-dextromethorphan (ROBITUSSIN DM) 100-10 MG/5ML syrup 10 mL  10 mL Oral Q4H PRN Coralyn Helling, MD      . ipratropium-albuterol (DUONEB) 0.5-2.5 (3) MG/3ML nebulizer solution 3 mL  3 mL Nebulization TID Coralyn Helling, MD   3 mL at 01/01/20 0830  . lactated ringers infusion   Intravenous Continuous Coralyn Helling, MD 10 mL/hr at 12/25/19 1208 Rate Change at 12/25/19 1208  . LORazepam (ATIVAN) injection 1 mg  1 mg Intravenous Q4H PRN Karilyn Cota, Nimish C, MD   1 mg at 01/01/20 1223  . LORazepam (ATIVAN) tablet 0.5 mg  0.5 mg Oral Q6H PRN Karilyn Cota, Nimish C, MD   0.5 mg at 12/31/19 1624  . MEDLINE mouth rinse  15 mL Mouth Rinse BID Coralyn Helling, MD   15 mL at 01/01/20 0854  . metoprolol tartrate (LOPRESSOR) tablet 25 mg  25 mg Oral BID Erick Blinks, MD   25 mg at 01/01/20 0853  . multivitamin with minerals tablet 1 tablet  1 tablet Oral Daily Coralyn Helling, MD   1 tablet at 01/01/20 0853  . OLANZapine (ZYPREXA) tablet 10 mg  10 mg Oral QHS Coralyn Helling, MD   10 mg at 12/31/19 2102  . ondansetron (ZOFRAN) tablet  4 mg  4 mg Oral Q6H PRN Coralyn Helling, MD       Or  . ondansetron (ZOFRAN) injection 4 mg  4 mg Intravenous Q6H PRN Coralyn Helling, MD      . pantoprazole (PROTONIX) EC tablet 40 mg  40 mg Oral Daily Coralyn Helling, MD   40 mg at 01/01/20 0854  . polyethylene glycol (MIRALAX / GLYCOLAX) packet 17 g  17 g Oral Daily PRN Coralyn Helling, MD      . potassium chloride 10 mEq in 100 mL IVPB  10 mEq Intravenous Q1 Hr x 4 Memon, Jehanzeb, MD 100 mL/hr at 01/01/20 1346 10 mEq at 01/01/20 1346  . [START ON 01/02/2020] predniSONE (DELTASONE) tablet 20 mg  20 mg Oral Q breakfast Memon, Durward Mallard, MD      . thiamine tablet 100 mg  100 mg Oral Daily Coralyn Helling, MD   100 mg at 01/01/20 2956   Or  . thiamine (B-1) injection 100 mg  100 mg Intravenous Daily Coralyn Helling, MD   100 mg at 12/28/19 1129  . traZODone (DESYREL) tablet 100 mg  100 mg Oral QHS Erick Blinks, MD         Discharge Medications: Please see discharge summary for a list of discharge medications.  Relevant Imaging Results:  Relevant Lab Results:   Additional Information SS#: [id number]  Malyk Girouard Sherryle Lis, LCSW

## 2020-01-01 NOTE — Progress Notes (Signed)
Pt continuing to attempt to get out of bed, talking to people that aren't there, pulling at things. Placed mitts on patient, bed alarm is on, this RN is sitting by his room. Will continue to monitor.

## 2020-01-01 NOTE — Progress Notes (Signed)
Lama MD paged in regards to agitation and increased CIWA score. Verbally instructed to restart precedex drip as ordered. Patient agitated, combative, verbally abusive, tremors, and disoriented. Precedex restarted. Continue to monitor.

## 2020-01-01 NOTE — Progress Notes (Signed)
Speech Language Pathology Treatment: Dysphagia  Patient Details Name: Jeffrey [name] MRN: [id number] DOB: Sep 14, 1961 Today's Date: 01/01/2020 Time: 1914-7829 SLP Time Calculation (min) (ACUTE ONLY): 19 min  Assessment / Plan / Recommendation Clinical Impression  Pt seen for ongoing diagnostic dysphagia intervention. He is much more alert today. He continues to present with dysphonia, however increased vocalizations heard. He reported that he would "rather drink nothing than drink thick liquids". Cued cough, prior to po administration, yielded congested cough. Pt assessed with mechanical soft textures (banana) and thin water (self fed). No overt signs of aspiration. Pt is not really consuming the NTL and appears to consume thins without incident so will advance to thin liquids and consider objective assessment as needed due to dysphonia/aphonia, congested cough, and recent chest xray showing bibasilar opacities. Continue mechanical soft textures.    HPI HPI: This is a 59 year old man with severe COPD who is usually on 3 to 4 L/min of nasal cannula at home and who has history of diastolic congestive heart failure was admitted 12/24/19 with dyspnea and confusion.  He was intubated and mechanically ventilated fairly quickly in the emergency room. He was extubated 12/26/19. Acute on chronic respiratory failure with hypoxia and hypercarbia .  This is from his severe COPD.  Now extubated.  Continue with steroids, antibiotics and bronchodilators.  Pulmonary following. Pt was seen for MBSS 02/27/20 during acute stay at [facility] and a mechanical soft diet with thin liquids was recommended (Pt refused puree/NTL prior to MBSS). BSE ordered      SLP Plan  Continue with current plan of care       Recommendations  Diet recommendations: Dysphagia 3 (mechanical soft);Thin liquid Liquids provided via: Cup;Straw Medication Administration: Whole meds with puree Supervision: Patient able to self feed;Staff to  assist with self feeding;Full supervision/cueing for compensatory strategies Compensations: Slow rate;Small sips/bites Postural Changes and/or Swallow Maneuvers: Seated upright 90 degrees;Upright 30-60 min after meal                Oral Care Recommendations: Oral care BID;Staff/trained caregiver to provide oral care Follow up Recommendations: 24 hour supervision/assistance SLP Visit Diagnosis: Dysphagia, unspecified (R13.10) Plan: Continue with current plan of care       Thank you,  Havery Moros, CCC-SLP [phone]                 Ouita Nish 01/01/2020, 2:42 PM

## 2020-01-01 NOTE — Care Management Important Message (Signed)
Important Message  Patient Details  Name: Jeffrey [name] MRN: [id number] Date of Birth: 1961/02/01   Medicare Important Message Given:  Yes(letter explained to spouse, mailed to address on file)     Corey Harold 01/01/2020, 10:37 AM

## 2020-01-01 NOTE — TOC Progression Note (Signed)
Transition of Care [facility]) - Progression Note    Patient Details  Name: MACARI ZALESKY MRN: [id number] Date of Birth: 1961-10-18  Transition of Care [facility]) CM/SW Contact  Jamielynn Wigley Sherryle Lis, LCSW Phone Number: 01/01/2020, 4:59 PM  Clinical Narrative:   CSW completed [facility] referral and sent out for SNF placement. Will continue to follow for discharge related needs  Aalayah Riles Tomma Rakers Transitions of Care  Clinical Social Worker  Ph: [phone]    Expected Discharge Plan: Skilled Nursing Facility Barriers to Discharge: Continued Medical Work up  Expected Discharge Plan and Services Expected Discharge Plan: Skilled Nursing Facility       Living arrangements for the past 2 months: Single Family Home                                       Social Determinants of Health (SDOH) Interventions    Readmission Risk Interventions Readmission Risk Prevention Plan 05/01/2019 03/30/2019 03/28/2019  Transportation Screening Complete - Complete  Medication Review (RN Care Manager) - - Complete  PCP or Specialist appointment within 3-5 days of discharge - Not Complete -  PCP/Specialist Appt Not Complete comments - recommended for 1 week f/u, PCP closed today, patient to call tomorrow and make appt.  -  HRI or Home Care Consult Complete - Complete  SW Recovery Care/Counseling Consult Complete - Complete  Palliative Care Screening Not Applicable - Not Applicable  Skilled Nursing Facility Not Applicable - Not Applicable  Some recent data might be hidden

## 2020-01-02 MED ORDER — ZIPRASIDONE MESYLATE 20 MG IM SOLR
20.0000 mg | Freq: Two times a day (BID) | INTRAMUSCULAR | Status: DC | PRN
  Administered 2020-01-04 – 2020-01-08 (×3): 20 mg via INTRAMUSCULAR
  Filled 2020-01-02 (×5): qty 20

## 2020-01-02 MED ORDER — ENSURE ENLIVE PO LIQD
237.0000 mL | Freq: Two times a day (BID) | ORAL | Status: DC
  Administered 2020-01-02 – 2020-01-05 (×6): 237 mL via ORAL

## 2020-01-02 NOTE — Progress Notes (Addendum)
Patient ID: Jeffrey [name], male   DOB: 02/20/1961, 59 y.o.   MRN: [id number]   Medication consultation requested for agitation. Per chart review, patient has ordered  Haldol 5 mg q6hrs PRN and Ativan 1 mg po q4hrs as needed for agitation. If this is not sufficient, I am recommending Geodon 20 mg Q12hrs as needed with continued Ativan as above. Reviewed EKG. QTc is 416

## 2020-01-02 NOTE — Progress Notes (Signed)
MD Memon made aware pt is refusing medication and spitting them on the floor. Pt states that MD said restraints could be removed and he could go home. Notified pt that MD has not been in yet to see pt and went over discontinuation criteria for wrist restraints. Educated pt on appropriate behavior and safety for both staff and pt in order to discontinue. Pt is uncooperative at this time.

## 2020-01-02 NOTE — Progress Notes (Signed)
PROGRESS NOTE    Jeffrey [name]  MWU:[id number] DOB: [birth year] DOA: 12/24/2019 PCP: Gareth Morgan, MD    Brief Narrative:  59 year old male with history of COPD is chronically on 3 to 4 L of oxygen, admitted with worsening shortness of breath.  Felt to be related to COPD exacerbation.  He was intubated in the emergency room and admitted to the ICU.  Fortunately, he was able to extubate on 2/2.  Hospital course further complicated by development of acute metabolic encephalopathy.  Etiology is unclear, but may be related to high-dose steroids.  Is currently on Precedex infusion.  Steroids are being tapered.   Assessment & Plan:   Active Problems:   Bipolar disorder with severe depression (HCC)   Essential hypertension   COPD with acute exacerbation (HCC)   Acute on chronic respiratory failure with hypercapnia (HCC)   Acute on chronic respiratory failure with hypoxia and hypercapnia (HCC)   Lobar pneumonia (HCC)   Acute metabolic encephalopathy   Acute on chronic diastolic CHF (congestive heart failure) (HCC)   Aspiration pneumonitis (HCC)   1. Acute on chronic respiratory failure with hypoxia.  Secondary to COPD exacerbation and aspiration pneumonia.  Patient was intubated on admission, but was able to extubate 2/2.  Overall respiratory status appears to be stable.  Currently, he is requiring 2 L which is his baseline.. 2. COPD exacerbation.  Wean prednisone.  Continue bronchodilators.  Respiratory status appears to be stable at this time. 3. Aspiration pneumonia.  He has completed his antibiotics in the hospital.  He is on modified diet with thickened liquids.  Speech therapy following 4. Acute metabolic encephalopathy.  Patient developed significant agitation/delirium and required transfer to ICU for further management.  When he did not respond to Ativan, he was started on Precedex infusion.  Blood gas showed mildly elevated PCO2, but pH was compensated indicating a chronic process.   Urinalysis unremarkable and ammonia is only minimally elevated and unlikely to be clinically significant.  He did not have any focal deficits.  He has a previous history of alcohol use.  It is unclear whether he has been recently drinking.  Patient appears to be more calm on Precedex and is able to wean off during the day.  He typically gets more agitated at night and is placed back on Precedex.  He is already on Depakote and olanzapine at night.  Psychiatry consult requested to help with medical management.  Recommendations for Geodon every 12 hours as needed 5. Bipolar disorder.  Continue home psychotropic medications including olanzapine and Depakote 6. Hypertension.  Stable on metoprolol.   DVT prophylaxis: Lovenox Code Status: Full code Family Communication: Discussed with daughter over the phone who is the POA Disposition Plan: Discharge to skilled nursing facility once mental status has stabilized off of Precedex infusion   Consultants:   Pulmonology  Psychiatry  Procedures:   Intubation  Antimicrobials:   Zosyn 1/31> 2/7   Subjective: Patient required restraints overnight.  Became agitated again.  Was placed back on Precedex.  Objective: Vitals:   01/02/20 1630 01/02/20 1700 01/02/20 1800 01/02/20 1900  BP: 125/63 133/68 119/66 133/68  Pulse: (!) 55 (!) 55 64 (!) 52  Resp: 10 17 (!) 21 17  Temp:      TempSrc:      SpO2: 94% 96% 97% 96%  Weight:      Height:        Intake/Output Summary (Last 24 hours) at 01/02/2020 1914 Last data filed at 01/02/2020 7253  Gross per 24 hour  Intake 751.45 ml  Output 900 ml  Net -148.55 ml   Filed Weights   12/29/19 0500 12/31/19 0500 01/01/20 0500  Weight: 66.8 kg 67.2 kg 68.5 kg    Examination:  General exam: Alert, awake, no distress Respiratory system: Clear to auscultation. Respiratory effort normal. Cardiovascular system:RRR. No murmurs, rubs, gallops. Gastrointestinal system: Abdomen is nondistended, soft and  nontender. No organomegaly or masses felt. Normal bowel sounds heard. Central nervous system: No focal neurological deficits. Extremities: No C/C/E, +pedal pulses Skin: No rashes, lesions or ulcers Psychiatry: Calm, pleasant.      Data Reviewed: I have personally reviewed following labs and imaging studies  CBC: Recent Labs  Lab 12/27/19 0621 12/29/19 1037 12/30/19 0453  WBC 6.8 4.8 5.0  HGB 10.9* 11.2* 10.9*  HCT 35.9* 35.1* 34.8*  MCV 94.2 92.4 91.8  PLT 221 207 208   Basic Metabolic Panel: Recent Labs  Lab 12/27/19 0621 12/28/19 0556 12/29/19 1037 12/30/19 0453 01/01/20 0422  NA 145 146* 144 141 142  K 3.9 3.3* 3.2* 4.0 3.3*  CL 94* 91* 99 99 101  CO2 41* 40* 37* 33* 32  GLUCOSE 99 91 140* 115* 84  BUN 25* 30* 30* 22* 16  CREATININE 0.59* 0.71 0.78 0.75 0.65  CALCIUM 8.8* 9.1 8.6* 8.7* 8.5*  MG 2.1  --   --   --   --   PHOS 5.6*  --   --   --   --    GFR: Estimated Creatinine Clearance: 96.3 mL/min (by C-G formula based on SCr of 0.65 mg/dL). Liver Function Tests: Recent Labs  Lab 12/30/19 0453  AST 20  ALT 22  ALKPHOS 52  BILITOT 0.7  PROT 5.5*  ALBUMIN 2.7*   No results for input(s): LIPASE, AMYLASE in the last 168 hours. Recent Labs  Lab 12/29/19 1112  AMMONIA 43*   Coagulation Profile: No results for input(s): INR, PROTIME in the last 168 hours. Cardiac Enzymes: No results for input(s): CKTOTAL, CKMB, CKMBINDEX, TROPONINI in the last 168 hours. BNP (last 3 results) No results for input(s): PROBNP in the last 8760 hours. HbA1C: No results for input(s): HGBA1C in the last 72 hours. CBG: Recent Labs  Lab 12/27/19 0729 12/27/19 1134  GLUCAP 89 93   Lipid Profile: No results for input(s): CHOL, HDL, LDLCALC, TRIG, CHOLHDL, LDLDIRECT in the last 72 hours. Thyroid Function Tests: No results for input(s): TSH, T4TOTAL, FREET4, T3FREE, THYROIDAB in the last 72 hours. Anemia Panel: No results for input(s): VITAMINB12, FOLATE, FERRITIN,  TIBC, IRON, RETICCTPCT in the last 72 hours. Sepsis Labs: No results for input(s): PROCALCITON, LATICACIDVEN in the last 168 hours.  Recent Results (from the past 240 hour(s))  Respiratory Panel by RT PCR (Flu A&B, Covid) - Nasopharyngeal Swab     Status: None   Collection Time: 12/24/19  3:38 PM   Specimen: Nasopharyngeal Swab  Result Value Ref Range Status   SARS Coronavirus 2 by RT PCR NEGATIVE NEGATIVE Final    Comment: (NOTE) SARS-CoV-2 target nucleic acids are NOT DETECTED. The SARS-CoV-2 RNA is generally detectable in upper respiratoy specimens during the acute phase of infection. The lowest concentration of SARS-CoV-2 viral copies this assay can detect is 131 copies/mL. A negative result does not preclude SARS-Cov-2 infection and should not be used as the sole basis for treatment or other patient management decisions. A negative result may occur with  improper specimen collection/handling, submission of specimen other than nasopharyngeal swab, presence of  viral mutation(s) within the areas targeted by this assay, and inadequate number of viral copies (<131 copies/mL). A negative result must be combined with clinical observations, patient history, and epidemiological information. The expected result is Negative. Fact Sheet for Patients:  PinkCheek.be Fact Sheet for Healthcare Providers:  GravelBags.it This test is not yet ap proved or cleared by the [place] FDA and  has been authorized for detection and/or diagnosis of SARS-CoV-2 by FDA under an Emergency Use Authorization (EUA). This EUA will remain  in effect (meaning this test can be used) for the duration of the COVID-19 declaration under Section 564(b)(1) of the Act, 21 U.S.C. section 360bbb-3(b)(1), unless the authorization is terminated or revoked sooner.    Influenza A by PCR NEGATIVE NEGATIVE Final   Influenza B by PCR NEGATIVE NEGATIVE Final     Comment: (NOTE) The Xpert Xpress SARS-CoV-2/FLU/RSV assay is intended as an aid in  the diagnosis of influenza from Nasopharyngeal swab specimens and  should not be used as a sole basis for treatment. Nasal washings and  aspirates are unacceptable for Xpert Xpress SARS-CoV-2/FLU/RSV  testing. Fact Sheet for Patients: PinkCheek.be Fact Sheet for Healthcare Providers: GravelBags.it This test is not yet approved or cleared by the [place] FDA and  has been authorized for detection and/or diagnosis of SARS-CoV-2 by  FDA under an Emergency Use Authorization (EUA). This EUA will remain  in effect (meaning this test can be used) for the duration of the  Covid-19 declaration under Section 564(b)(1) of the Act, 21  U.S.C. section 360bbb-3(b)(1), unless the authorization is  terminated or revoked. Performed at [facility], [address] [place], [place] [address]   Blood culture (routine x 2)     Status: None   Collection Time: 12/24/19  5:04 PM   Specimen: BLOOD LEFT HAND  Result Value Ref Range Status   Specimen Description BLOOD LEFT HAND  Final   Special Requests   Final    BOTTLES DRAWN AEROBIC AND ANAEROBIC Blood Culture adequate volume   Culture   Final    NO GROWTH 5 DAYS Performed at [facility], [address] [place], [place] [address]    Report Status 12/29/2019 FINAL  Final  Blood culture (routine x 2)     Status: None   Collection Time: 12/24/19  5:05 PM   Specimen: BLOOD LEFT HAND  Result Value Ref Range Status   Specimen Description BLOOD LEFT HAND  Final   Special Requests   Final    BOTTLES DRAWN AEROBIC AND ANAEROBIC Blood Culture adequate volume   Culture   Final    NO GROWTH 5 DAYS Performed at [facility], [address], [place], [place] [address]    Report Status 12/29/2019 FINAL  Final  MRSA PCR Screening     Status: None   Collection Time: 12/24/19  8:59 PM   Specimen: Nasopharyngeal  Result  Value Ref Range Status   MRSA by PCR NEGATIVE NEGATIVE Final    Comment:        The GeneXpert MRSA Assay (FDA approved for NASAL specimens only), is one component of a comprehensive MRSA colonization surveillance program. It is not intended to diagnose MRSA infection nor to guide or monitor treatment for MRSA infections. Performed at [facility], [address], [place], [place] [address]   Culture, respiratory (non-expectorated)     Status: None   Collection Time: 12/25/19  8:21 AM   Specimen: Tracheal Aspirate; Respiratory  Result Value Ref Range Status  Specimen Description   Final    TRACHEAL ASPIRATE COLLECTED BY RT Performed at [facility], [address] [place], [place] [address]    Special Requests   Final    NONE Performed at [facility], [address], [place], [place] [address]    Gram Stain   Final    FEW WBC PRESENT, PREDOMINANTLY PMN NO ORGANISMS SEEN    Culture   Final    RARE Consistent with normal respiratory flora. Performed at [facility], [address] [place], [place] [address]    Report Status 12/27/2019 FINAL  Final  MRSA PCR Screening     Status: None   Collection Time: 12/25/19  9:11 AM   Specimen: Nasal Mucosa; Nasopharyngeal  Result Value Ref Range Status   MRSA by PCR NEGATIVE NEGATIVE Final    Comment:        The GeneXpert MRSA Assay (FDA approved for NASAL specimens only), is one component of a comprehensive MRSA colonization surveillance program. It is not intended to diagnose MRSA infection nor to guide or monitor treatment for MRSA infections. Performed at [facility], [address], [place], [place] [address]   Culture, Urine     Status: None   Collection Time: 12/25/19  9:12 AM   Specimen: Urine, Clean Catch  Result Value Ref Range Status   Specimen Description   Final    URINE, CLEAN CATCH Performed at [facility], [address] [place], [place] [address]    Special Requests   Final    NONE Performed at  [facility], [address], [place], [place] [address]    Culture   Final    NO GROWTH Performed at [facility], [address], [place], [place] [address]    Report Status 12/26/2019 FINAL  Final         Radiology Studies: No results found.      Scheduled Meds: . aspirin EC  81 mg Oral QPM  . atorvastatin  20 mg Oral QPM  . budesonide (PULMICORT) nebulizer solution  0.5 mg Nebulization BID  . Chlorhexidine Gluconate Cloth  6 each Topical Daily  . divalproex  1,500 mg Oral QHS  . enoxaparin (LOVENOX) injection  40 mg Subcutaneous Q24H  . feeding supplement (ENSURE ENLIVE)  237 mL Oral BID BM  . FLUoxetine  40 mg Oral Daily  . folic acid  1 mg Oral Daily  . ipratropium-albuterol  3 mL Nebulization TID  . mouth rinse  15 mL Mouth Rinse BID  . metoprolol tartrate  25 mg Oral BID  . multivitamin with minerals  1 tablet Oral Daily  . OLANZapine  10 mg Oral QHS  . pantoprazole  40 mg Oral Daily  . predniSONE  20 mg Oral Q breakfast  . thiamine  100 mg Oral Daily   Or  . thiamine  100 mg Intravenous Daily  . traZODone  100 mg Oral QHS   Continuous Infusions: . sodium chloride Stopped (01/01/20 1511)  . dexmedetomidine (PRECEDEX) IV infusion 0.4 mcg/kg/hr (01/02/20 1837)  . lactated ringers 10 mL/hr at 01/02/20 1700     LOS: 9 days    Time spent:    Erick Blinks, MD [facility]   If 7PM-7AM, please contact night-coverage [email or web address]  01/02/2020, 7:14 PM

## 2020-01-02 NOTE — Progress Notes (Signed)
Trial initiated for removal of restraints at this time. Untied restraints from bed as pt is calm and cooperative at this time. Pt ate lunch with RN assistance.

## 2020-01-02 NOTE — Progress Notes (Signed)
Nutrition Follow-up  DOCUMENTATION CODES:   Not applicable  INTERVENTION:  Ensure Enlive po BID, each supplement provides 350 kcal and 20 grams of protein  Magic cup BID with meals, each supplement provides 290 kcal and 9 grams of protein  Continue MVI with minerals daily  NUTRITION DIAGNOSIS:   Inadequate oral intake related to inability to eat as evidenced by NPO status.  Progressing, diet advanced to dysphagia 3   GOAL:   Provide needs based on ASPEN/SCCM guidelines  Met  MONITOR:   Vent status, Labs, Weight trends, I & O's, TF tolerance  REASON FOR ASSESSMENT:   Consult Assessment of nutrition requirement/status(Tube feeding recommendations)  ASSESSMENT:   Patient is a 58 yo male with hx of COPD( Chronic O2), CHF, and recent pneumonia. He presents with short of breath. Failed BiPAP and is now intubated.  1/31 Admit 2/1 Intubated 2/2 Extubated 2/4 Diet advanced - dysphagia 3; nectar thick  Meal history reviewed, patient eating 25-50% x 4 documented meals. Estimated needs re-evaluated to reflect nutrition needs s/p extubation. Will provide Ensure as well Magic Cup with meals to aid with calorie/protein needs.   Per notes, trial removal of soft restraints. Pt calm and cooperative, ate lunch with RN assistance.   Medications reviewed and include: MVI, Protonix, Thiamine  Precedex 0.5 mcg  Labs: BG 84,115 x 24 hrs, K 3.3 (L)  Diet Order:   Diet Order            DIET DYS 3 Room service appropriate? Yes; Fluid consistency: Thin  Diet effective now              EDUCATION NEEDS:   No education needs have been identified at this time  Skin:  Skin Assessment: Reviewed RN Assessment(MASD-sacrum)  Last BM:  2/8 type 6  Height:   Ht Readings from Last 1 Encounters:  12/25/19 5' 10" (1.778 m)    Weight:   Wt Readings from Last 1 Encounters:  01/01/20 68.5 kg    Ideal Body Weight:  75.5 kg  BMI:  Body mass index is 21.67 kg/m.  Estimated  Nutritional Needs:   Kcal:  1800-2000  Protein:  90-100  Fluid:  >1800 ml daily    Suzanne , RD, LDN Clinical Nutrition Office Telephone [phone] After Hours/Weekend Pager: [phone]  

## 2020-01-03 LAB — CBC
HCT: 35.6 % — ABNORMAL LOW (ref 39.0–52.0)
Hemoglobin: 11.3 g/dL — ABNORMAL LOW (ref 13.0–17.0)
MCH: 28.6 pg (ref 26.0–34.0)
MCHC: 31.7 g/dL (ref 30.0–36.0)
MCV: 90.1 fL (ref 80.0–100.0)
Platelets: 319 10*3/uL (ref 150–400)
RBC: 3.95 MIL/uL — ABNORMAL LOW (ref 4.22–5.81)
RDW: 12.8 % (ref 11.5–15.5)
WBC: 6.1 10*3/uL (ref 4.0–10.5)
nRBC: 0 % (ref 0.0–0.2)

## 2020-01-03 LAB — BASIC METABOLIC PANEL
Anion gap: 8 (ref 5–15)
BUN: 7 mg/dL (ref 6–20)
CO2: 34 mmol/L — ABNORMAL HIGH (ref 22–32)
Calcium: 8.6 mg/dL — ABNORMAL LOW (ref 8.9–10.3)
Chloride: 99 mmol/L (ref 98–111)
Creatinine, Ser: 0.62 mg/dL (ref 0.61–1.24)
GFR calc Af Amer: >60 mL/min (ref >60)
GFR calc non Af Amer: >60 mL/min (ref >60)
Glucose, Bld: 70 mg/dL (ref 70–99)
Potassium: 3.4 mmol/L — ABNORMAL LOW (ref 3.5–5.1)
Sodium: 141 mmol/L (ref 135–145)

## 2020-01-03 MED ORDER — POTASSIUM CHLORIDE CRYS ER 20 MEQ PO TBCR
40.0000 meq | EXTENDED_RELEASE_TABLET | ORAL | Status: AC
  Administered 2020-01-03 (×2): 40 meq via ORAL
  Filled 2020-01-03 (×2): qty 2

## 2020-01-03 NOTE — Progress Notes (Signed)
Patient still on Precedex gtt and has been very appropriate, calm, and cooperative. Restraints are off. Continuing to monitor patient.

## 2020-01-03 NOTE — Progress Notes (Signed)
PROGRESS NOTE    Jeffrey [name]  NWG:[id number] DOB: [birth year] DOA: 12/24/2019 PCP: Gareth Morgan, MD    Brief Narrative:  59 year old male with history of COPD is chronically on 3 to 4 L of oxygen, admitted with worsening shortness of breath.  Felt to be related to COPD exacerbation.  He was intubated in the emergency room and admitted to the ICU.  Fortunately, he was able to extubate on 2/2.  Hospital course further complicated by development of acute metabolic encephalopathy.  Etiology is unclear, but may be related to high-dose steroids.  Is currently on Precedex infusion.  Steroids are being tapered.  Assessment & Plan:   Active Problems:   Bipolar disorder with severe depression (HCC)   Essential hypertension   COPD with acute exacerbation (HCC)   Acute on chronic respiratory failure with hypercapnia (HCC)   Acute on chronic respiratory failure with hypoxia and hypercapnia (HCC)   Lobar pneumonia (HCC)   Acute metabolic encephalopathy   Acute on chronic diastolic CHF (congestive heart failure) (HCC)   Aspiration pneumonitis (HCC)   1)Acute on chronic respiratory failure with hypoxia-ABG suggested mild hypercapnia as well----- secondary to COPD exacerbation and aspiration pneumonia.  Patient was intubated on admission (12/24/19), but was able to extubate 12/26/19.  Overall respiratory status appears to be stable.  Currently, he is requiring 2 L, PTA patient does really use 3 L/min at home    2) acute metabolic encephalopathy with agitation----patient with underlying history of bipolar disorder, was on high-dose steroids for COPD exacerbation respiratory failure, developed psychosis and agitation not correctable with Haldol and lorazepam -[facility] and PCCM consult appreciated, treated with Precedex drip and as needed Geodon --Mental status improving, some confusional episodes persist -Unable to wean off Precedex drip due to agitation and concerns -Steroids being tapered -Patient with  history of EtOH abuse, denies recent EtOH use  3)Acute COPD exacerbation secondary to presumed aspiration pneumonia --- extubated 12/26/2019 , c =-ABG was suggestive of hypoxia and hypercapnia, completed antibiotics already, okay to wean off steroids   4)Bipolar disorder.  Continue home psychotropic medications including olanzapine and Depakote, also continue Prozac, lorazepam and trazodone,--- additional psych medication adjustments as above  5)Hypertension.  Stable on metoprolol 5 twice daily  6)Dysphonia/Dysphagia--- patient voice is very soft/muffled apparently this is not new -Speech pathology eval appreciated recommends-Dysphagia 3 (mechanical soft);Nectar-thick liquid  7)[name]--stable, continue atorvastatin and aspirin   DVT prophylaxis: Lovenox Code Status: Full code Family Communication: Discussed with daughter over the phone who is the POA Disposition Plan: Continues to require IV Precedex, anticipate discharge to SNF rehab once encephalopathy resolves  Consultants:   Pulmonology  Psychiatry  Procedures:   Intubation 12/24/19  Extubation- 12/26/19  Antimicrobials:   Zosyn 1/31> 2/7   Subjective: --Unable to come off Precedex drip due to persistent agitation -Oral intake is fair  Objective: Vitals:   01/03/20 1700 01/03/20 1701 01/03/20 1817 01/03/20 1950  BP: 100/63     Pulse: 68     Resp:  20    Temp:   98 F (36.7 C)   TempSrc:   Oral   SpO2: 93%   98%  Weight:      Height:        Intake/Output Summary (Last 24 hours) at 01/03/2020 2012 Last data filed at 01/03/2020 1200 Gross per 24 hour  Intake 150.31 ml  Output 1200 ml  Net -1049.69 ml   Filed Weights   12/31/19 0500 01/01/20 0500 01/03/20 0600  Weight: 67.2 kg  68.5 kg 66.5 kg    Examination:  General exam: Alert, awake, no distress Nose- [place] 2L/min Respiratory system: Fair air movement, no wheezing  cardiovascular system:RRR. No murmurs, rubs, gallops. Gastrointestinal system: Abdomen is  nondistended, soft and nontender.   Normal bowel sounds heard. Central nervous system: No focal neurological deficits, more/soft voice Extremities: No C/C/E, +pedal pulses Skin: No rashes, lesions or ulcers Psychiatry: Episodes of agitation with attempts to come off Precedex drip   Data Reviewed: I have personally reviewed following labs and imaging studies  CBC: Recent Labs  Lab 12/29/19 1037 12/30/19 0453 01/03/20 0411  WBC 4.8 5.0 6.1  HGB 11.2* 10.9* 11.3*  HCT 35.1* 34.8* 35.6*  MCV 92.4 91.8 90.1  PLT 207 208 301   Basic Metabolic Panel: Recent Labs  Lab 12/28/19 0556 12/29/19 1037 12/30/19 0453 01/01/20 0422 01/03/20 0411  NA 146* 144 141 142 141  K 3.3* 3.2* 4.0 3.3* 3.4*  CL 91* 99 99 101 99  CO2 40* 37* 33* 32 34*  GLUCOSE 91 140* 115* 84 70  BUN 30* 30* 22* 16 7  CREATININE 0.71 0.78 0.75 0.65 0.62  CALCIUM 9.1 8.6* 8.7* 8.5* 8.6*   GFR: Estimated Creatinine Clearance: 93.5 mL/min (by C-G formula based on SCr of 0.62 mg/dL). Liver Function Tests: Recent Labs  Lab 12/30/19 0453  AST 20  ALT 22  ALKPHOS 52  BILITOT 0.7  PROT 5.5*  ALBUMIN 2.7*   No results for input(s): LIPASE, AMYLASE in the last 168 hours. Recent Labs  Lab 12/29/19 1112  AMMONIA 43*   Coagulation Profile: No results for input(s): INR, PROTIME in the last 168 hours. Cardiac Enzymes: No results for input(s): CKTOTAL, CKMB, CKMBINDEX, TROPONINI in the last 168 hours. BNP (last 3 results) No results for input(s): PROBNP in the last 8760 hours. HbA1C: No results for input(s): HGBA1C in the last 72 hours. CBG: No results for input(s): GLUCAP in the last 168 hours. Lipid Profile: No results for input(s): CHOL, HDL, LDLCALC, TRIG, CHOLHDL, LDLDIRECT in the last 72 hours. Thyroid Function Tests: No results for input(s): TSH, T4TOTAL, FREET4, T3FREE, THYROIDAB in the last 72 hours. Anemia Panel: No results for input(s): VITAMINB12, FOLATE, FERRITIN, TIBC, IRON, RETICCTPCT in  the last 72 hours. Sepsis Labs: No results for input(s): PROCALCITON, LATICACIDVEN in the last 168 hours.  Recent Results (from the past 240 hour(s))  MRSA PCR Screening     Status: None   Collection Time: 12/24/19  8:59 PM   Specimen: Nasopharyngeal  Result Value Ref Range Status   MRSA by PCR NEGATIVE NEGATIVE Final    Comment:        The GeneXpert MRSA Assay (FDA approved for NASAL specimens only), is one component of a comprehensive MRSA colonization surveillance program. It is not intended to diagnose MRSA infection nor to guide or monitor treatment for MRSA infections. Performed at [facility], [address], [place], [place] [address]   Culture, respiratory (non-expectorated)     Status: None   Collection Time: 12/25/19  8:21 AM   Specimen: Tracheal Aspirate; Respiratory  Result Value Ref Range Status   Specimen Description   Final    TRACHEAL ASPIRATE COLLECTED BY RT Performed at [facility], [address], [place], [place] [address]    Special Requests   Final    NONE Performed at [facility], [address] [place], [place] [address]    Gram Stain   Final    FEW WBC PRESENT, PREDOMINANTLY PMN NO  ORGANISMS SEEN    Culture   Final    RARE Consistent with normal respiratory flora. Performed at [facility], [address] [place], [place] [address]    Report Status 12/27/2019 FINAL  Final  MRSA PCR Screening     Status: None   Collection Time: 12/25/19  9:11 AM   Specimen: Nasal Mucosa; Nasopharyngeal  Result Value Ref Range Status   MRSA by PCR NEGATIVE NEGATIVE Final    Comment:        The GeneXpert MRSA Assay (FDA approved for NASAL specimens only), is one component of a comprehensive MRSA colonization surveillance program. It is not intended to diagnose MRSA infection nor to guide or monitor treatment for MRSA infections. Performed at [facility], [address] [place], [place] [address]   Culture, Urine     Status: None   Collection  Time: 12/25/19  9:12 AM   Specimen: Urine, Clean Catch  Result Value Ref Range Status   Specimen Description   Final    URINE, CLEAN CATCH Performed at [facility], [address], [place], [place] [address]    Special Requests   Final    NONE Performed at [facility] [address], [place], [place] [address]    Culture   Final    NO GROWTH Performed at [facility], [address], [place], [place] [address]    Report Status 12/26/2019 FINAL  Final         Radiology Studies: No results found.      Scheduled Meds: . aspirin EC  81 mg Oral QPM  . atorvastatin  20 mg Oral QPM  . budesonide (PULMICORT) nebulizer solution  0.5 mg Nebulization BID  . Chlorhexidine Gluconate Cloth  6 each Topical Daily  . divalproex  1,500 mg Oral QHS  . enoxaparin (LOVENOX) injection  40 mg Subcutaneous Q24H  . feeding supplement (ENSURE ENLIVE)  237 mL Oral BID BM  . FLUoxetine  40 mg Oral Daily  . folic acid  1 mg Oral Daily  . ipratropium-albuterol  3 mL Nebulization TID  . mouth rinse  15 mL Mouth Rinse BID  . metoprolol tartrate  25 mg Oral BID  . multivitamin with minerals  1 tablet Oral Daily  . OLANZapine  10 mg Oral QHS  . pantoprazole  40 mg Oral Daily  . predniSONE  20 mg Oral Q breakfast  . thiamine  100 mg Oral Daily   Or  . thiamine  100 mg Intravenous Daily  . traZODone  100 mg Oral QHS   Continuous Infusions: . sodium chloride Stopped (01/01/20 1511)  . dexmedetomidine (PRECEDEX) IV infusion 0.4 mcg/kg/hr (01/03/20 0935)  . lactated ringers 10 mL/hr at 01/02/20 1700     LOS: 10 days    Shon Hale, MD [facility]   If 7PM-7AM, please contact night-coverage [email or web address]  01/03/2020, 8:12 PM

## 2020-01-04 MED ORDER — ZIPRASIDONE MESYLATE 20 MG IM SOLR
20.0000 mg | Freq: Once | INTRAMUSCULAR | Status: DC
  Filled 2020-01-04: qty 20

## 2020-01-04 MED ORDER — LORAZEPAM 0.5 MG PO TABS
0.5000 mg | ORAL_TABLET | Freq: Two times a day (BID) | ORAL | Status: DC
  Administered 2020-01-04 – 2020-01-05 (×3): 0.5 mg via ORAL
  Filled 2020-01-04 (×3): qty 1

## 2020-01-04 MED ORDER — ZIPRASIDONE MESYLATE 20 MG IM SOLR
20.0000 mg | Freq: Once | INTRAMUSCULAR | Status: AC
  Administered 2020-01-04: 20 mg via INTRAMUSCULAR
  Filled 2020-01-04: qty 20

## 2020-01-04 NOTE — Progress Notes (Addendum)
PROGRESS NOTE    Jeffrey [name]  GEZ:[id number] DOB: 1960/12/12 DOA: 12/24/2019 PCP: Gareth Morgan, MD    Brief Narrative:  59 year old male with history of COPD is chronically on 3 to 4 L of oxygen, admitted with worsening shortness of breath.  Felt to be related to COPD exacerbation.  He was intubated in the emergency room and admitted to the ICU.  Fortunately, he was able to extubate on 2/2.  Hospital course further complicated by development of acute metabolic encephalopathy.  Etiology is unclear, but may be related to high-dose steroids.  Required Precedex infusion.  Steroids have been tapered off ---Mental status overall much improved,  -Was able to wean off Precedex drip on 01/04/2020-   Assessment & Plan:   Active Problems:   Bipolar disorder with severe depression (HCC)   Essential hypertension   COPD with acute exacerbation (HCC)   Acute on chronic respiratory failure with hypercapnia (HCC)   Acute on chronic respiratory failure with hypoxia and hypercapnia (HCC)   Lobar pneumonia (HCC)   Acute metabolic encephalopathy   Acute on chronic diastolic CHF (congestive heart failure) (HCC)   Aspiration pneumonitis (HCC)   1)Acute on chronic respiratory failure with hypoxia-ABG suggested mild hypercapnia as well----- secondary to COPD exacerbation and aspiration pneumonia.  Patient was intubated on admission (12/24/19), but was able to extubate 12/26/19.  Overall respiratory status appears to be stable.  Currently, he is requiring 3 L, PTA used 2 L of oxygen via nasal cannula  2) acute metabolic encephalopathy with agitation----patient with underlying history of bipolar disorder, was on high-dose steroids for COPD exacerbation respiratory failure, developed psychosis and agitation not correctable with Haldol and lorazepam -[facility] and PCCM consult appreciated, treated with Precedex drip and as needed Geodon --Mental status overall much improved,  -Was able to wean off Precedex drip on  01/04/2020-  -Patient with history of EtOH abuse, denies recent EtOH use -As per daughter Ms. 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  Stable on metoprolol 25 mg  twice daily  6)Dysphonia/Dysphagia--- patient voice is very soft/muffled apparently this is not new -Speech pathology eval appreciated recommends-Dysphagia 3 (mechanical soft);Nectar-thick liquid  7)[name]--stable, continue atorvastatin and aspirin  8)Social/Ethics-- Discussed with Daughter [name] -[phone] [facility]), Pt is a Full code  9)Tobacco Abuse----smoking cessation  DVT prophylaxis: Lovenox Code Status: Full code  Family Communication: Discussed with daughter over the phone who is the POA---- [phone]  Disposition Plan: Await PT eval, possible SNF transfer if encephalopathy continues to improve off Precedex drip  Consultants:   Pulmonology  Psychiatry  Procedures:   Intubation 12/24/19  Extubation- 12/26/19  Antimicrobials:   Zosyn 1/31> 2/7  Other--off Precedex drip on 01/04/2020  Subjective: -Resting comfortably, more cooperative, more oriented,  Objective: Vitals:   01/04/20 0600 01/04/20 0700 01/04/20 1000 01/04/20 1200  BP: 133/81 138/82  108/65  Pulse: 69 63 82 78  Resp: 18  (!) 28 (!) 26  Temp:      TempSrc:      SpO2: 95%  (!) 88% 96%  Weight:  Height:        Intake/Output Summary  (Last 24 hours) at 01/04/2020 1345 Last data filed at 01/04/2020 1200 Gross per 24 hour  Intake 551.65 ml  Output 600 ml  Net -48.35 ml   Filed Weights   01/01/20 0500 01/03/20 0600 01/04/20 0500  Weight: 68.5 kg 66.5 kg 66.2 kg    Examination:  General exam: Alert, awake, no distress Nose- [place] 3L/min Respiratory system: Fair air movement, no wheezing  cardiovascular system:RRR. No murmurs, rubs, gallops. Gastrointestinal system: Abdomen is nondistended, soft and nontender.   Normal bowel sounds heard. Central nervous system: No focal neurological deficits, muffed/soft voice Extremities: No C/C/E, +pedal pulses Skin: No rashes, lesions or ulcers Psychiatry: Calm and cooperative  Data Reviewed: CBC: Recent Labs  Lab 12/29/19 1037 12/30/19 0453 01/03/20 0411  WBC 4.8 5.0 6.1  HGB 11.2* 10.9* 11.3*  HCT 35.1* 34.8* 35.6*  MCV 92.4 91.8 90.1  PLT 207 208 319   Basic Metabolic Panel: Recent Labs  Lab 12/29/19 1037 12/30/19 0453 01/01/20 0422 01/03/20 0411  NA 144 141 142 141  K 3.2* 4.0 3.3* 3.4*  CL 99 99 101 99  CO2 37* 33* 32 34*  GLUCOSE 140* 115* 84 70  BUN 30* 22* 16 7  CREATININE 0.78 0.75 0.65 0.62  CALCIUM 8.6* 8.7* 8.5* 8.6*   GFR: Estimated Creatinine Clearance: 93.1 mL/min (by C-G formula based on SCr of 0.62 mg/dL). Liver Function Tests: Recent Labs  Lab 12/30/19 0453  AST 20  ALT 22  ALKPHOS 52  BILITOT 0.7  PROT 5.5*  ALBUMIN 2.7*   No results for input(s): LIPASE, AMYLASE in the last 168 hours. Recent Labs  Lab 12/29/19 1112  AMMONIA 43*   Coagulation Profile: No results for input(s): INR, PROTIME in the last 168 hours. Cardiac Enzymes: No results for input(s): CKTOTAL, CKMB, CKMBINDEX, TROPONINI in the last 168 hours. BNP (last 3 results) No results for input(s): PROBNP in the last 8760 hours. HbA1C: No results for input(s): HGBA1C in the last 72 hours. CBG: No results for input(s): GLUCAP in the last 168 hours. Lipid  Profile: No results for input(s): CHOL, HDL, LDLCALC, TRIG, CHOLHDL, LDLDIRECT in the last 72 hours. Thyroid Function Tests: No results for input(s): TSH, T4TOTAL, FREET4, T3FREE, THYROIDAB in the last 72 hours. Anemia Panel: No results for input(s): VITAMINB12, FOLATE, FERRITIN, TIBC, IRON, RETICCTPCT in the last 72 hours. Sepsis Labs: No results for input(s): PROCALCITON, LATICACIDVEN in the last 168 hours.  No results found for this or any previous visit (from the past 240 hour(s)).   Radiology Studies: No results found.  Scheduled Meds: . aspirin EC  81 mg Oral QPM  . atorvastatin  20 mg Oral QPM  . budesonide (PULMICORT) nebulizer solution  0.5 mg Nebulization BID  . Chlorhexidine Gluconate Cloth  6 each Topical Daily  . divalproex  1,500 mg Oral QHS  . enoxaparin (LOVENOX) injection  40 mg Subcutaneous Q24H  . feeding supplement (ENSURE ENLIVE)  237 mL Oral BID BM  . FLUoxetine  40 mg Oral Daily  . folic acid  1 mg Oral Daily  . ipratropium-albuterol  3 mL Nebulization TID  . LORazepam  0.5 mg Oral BID  . mouth rinse  15 mL Mouth Rinse BID  . metoprolol tartrate  25 mg Oral BID  . multivitamin with minerals  1 tablet Oral Daily  . OLANZapine  10 mg Oral QHS  . pantoprazole  40 mg Oral Daily  . thiamine  100 mg Oral Daily   Or  . thiamine  100 mg Intravenous Daily  . traZODone  100 mg Oral QHS  . ziprasidone  20 mg Intramuscular Once   Continuous Infusions: . sodium chloride Stopped (01/01/20 1511)  . dexmedetomidine (PRECEDEX) IV infusion Stopped (01/04/20 1109)  . lactated ringers 10 mL/hr at 01/04/20 1200     LOS: 11 days   Roxan Hockey, MD [facility]   If 7PM-7AM, please contact night-coverage [email or web address]  01/04/2020, 1:45 PM

## 2020-01-05 MED ORDER — QUETIAPINE FUMARATE 25 MG PO TABS
25.0000 mg | ORAL_TABLET | Freq: Every morning | ORAL | Status: DC
  Administered 2020-01-05 – 2020-01-10 (×6): 25 mg via ORAL
  Filled 2020-01-05 (×6): qty 1

## 2020-01-05 MED ORDER — ENSURE ENLIVE PO LIQD
237.0000 mL | Freq: Three times a day (TID) | ORAL | Status: DC
  Administered 2020-01-06 – 2020-01-10 (×7): 237 mL via ORAL

## 2020-01-05 MED ORDER — LORAZEPAM 1 MG PO TABS
1.0000 mg | ORAL_TABLET | Freq: Two times a day (BID) | ORAL | Status: DC
  Administered 2020-01-05 – 2020-01-10 (×8): 1 mg via ORAL
  Filled 2020-01-05: qty 1
  Filled 2020-01-05: qty 2
  Filled 2020-01-05 (×6): qty 1

## 2020-01-05 MED ORDER — QUETIAPINE FUMARATE 25 MG PO TABS
50.0000 mg | ORAL_TABLET | Freq: Every day | ORAL | Status: DC
  Administered 2020-01-05 – 2020-01-09 (×4): 50 mg via ORAL
  Filled 2020-01-05 (×4): qty 2

## 2020-01-05 MED ORDER — TRAZODONE HCL 50 MG PO TABS
150.0000 mg | ORAL_TABLET | Freq: Every day | ORAL | Status: DC
  Administered 2020-01-05 – 2020-01-06 (×2): 150 mg via ORAL
  Filled 2020-01-05 (×2): qty 3

## 2020-01-05 NOTE — Progress Notes (Addendum)
Pt has remained off Precedex tonight and given the 20mg  of geodon once and PRN ativan twice. Pt still fumbling with cords but will mainly leave on BP cuff and Heart monitor leads. O2 sat has been fine every time it has been checked it is in upper 90's and 100's.Pt has been out of mitts all night. He will not leave the 02 sensor on regardless of where you put it due to what the patient calls "The laser light will hurt him". Pt on 2L of o2 for comfort during the night. Pt has rested most of my shift.

## 2020-01-05 NOTE — Evaluation (Signed)
Physical Therapy Evaluation Patient Details Name: Jeffrey [name] MRN: [id number] DOB: 1960/12/24 Today's Date: 01/05/2020   History of Present Illness  Jeffrey [name] is a 59 y.o. male with medical history significant for COPD with chronic respiratory failure, bipolar disorder, diastolic CHF, hepatic encephalopathy, depression. History is obtained from chart review and talking to patient's ex-wife.  Per chart review patient was brought into the ED reports of difficulty breathing over the past 3 days, with confusion, and in the ED patient was tremulous. Patient's ex-wife who now lives with patient tells me patient has not had any drink or use any drugs in the past year.  She reports increasing difficulty breathing and productive cough.  No vomiting no loose stools.  She said the past 3 days when patient was confused tells when he said he wanted someone to shoot him.  She otherwise denies suicidal ideations.  She also reports patient has gotten tired of using oxygen, his bronchodilators, and lying around as normally he likes to be active.    Clinical Impression  Patient presents restless, impulsive and agreeable for therapy.  Patient has most difficulty completing sit to stands due to BLE requiring multiple attempts before standing with RW, at high risk for falls and limited to a few steps at bedside and tolerated sitting up in chair after therapy to been seen by speech therapy - RN aware.  Patient will benefit from continued physical therapy in hospital and recommended venue below to increase strength, balance, endurance for safe ADLs and gait.    Follow Up Recommendations SNF    Equipment Recommendations  None recommended by PT    Recommendations for Other Services       Precautions / Restrictions Precautions Precautions: Fall Restrictions Weight Bearing Restrictions: No      Mobility  Bed Mobility Overal bed mobility: Needs Assistance Bed Mobility: Supine to Sit       Sit to  supine: Min assist   General bed mobility comments: labored movement  Transfers Overall transfer level: Needs assistance Equipment used: Rolling walker (2 wheeled) Transfers: Sit to/from [employer] Sit to Stand: Mod assist Stand pivot transfers: Mod assist       General transfer comment: very unsteady on feet, labored movement, increased time  Ambulation/Gait Ambulation/Gait assistance: Mod assist Gait Distance (Feet): 5 Feet Assistive device: Rolling walker (2 wheeled) Gait Pattern/deviations: Decreased step length - right;Decreased step length - left;Decreased stride length Gait velocity: decreased   General Gait Details: limited to 5-6 slow unsteady labored steps at bedside due to weakness, poor standing balance  Stairs            Wheelchair Mobility    Modified Rankin (Stroke Patients Only)       Balance Overall balance assessment: Needs assistance Sitting-balance support: Feet supported;No upper extremity supported Sitting balance-Leahy Scale: Fair Sitting balance - Comments: seated EOB   Standing balance support: During functional activity;Bilateral upper extremity supported Standing balance-Leahy Scale: Poor Standing balance comment: fair/poor using RW                             Pertinent Vitals/Pain Pain Assessment: No/denies pain    Home Living Family/patient expects to be discharged to:: Private residence Living Arrangements: Spouse/significant other Available Help at Discharge: Family Type of Home: House         Home Equipment: Dan Humphreys - 2 wheels Additional Comments: Patient is confused and appears to be a poor historian  of home set up/equipment and PLOF    Prior Function           Comments: Patient states household ambulator without AD     Hand Dominance   Dominant Hand: Right    Extremity/Trunk Assessment   Upper Extremity Assessment Upper Extremity Assessment: Generalized weakness    Lower  Extremity Assessment Lower Extremity Assessment: Generalized weakness    Cervical / Trunk Assessment Cervical / Trunk Assessment: Normal  Communication   Communication: No difficulties  Cognition Arousal/Alertness: Awake/alert Behavior During Therapy: Restless;Flat affect;Impulsive Overall Cognitive Status: No family/caregiver present to determine baseline cognitive functioning                                 General Comments: Patient mostly non-verbal      General Comments      Exercises     Assessment/Plan    PT Assessment Patient needs continued PT services  PT Problem List Decreased strength;Decreased activity tolerance;Decreased balance;Decreased mobility;Decreased knowledge of use of DME;Decreased cognition;Decreased safety awareness       PT Treatment Interventions DME instruction;Therapeutic exercise;Gait training;Balance training;Stair training;Functional mobility training;Therapeutic activities;Patient/family education    PT Goals (Current goals can be found in the Care Plan section)  Acute Rehab PT Goals Patient Stated Goal: return home PT Goal Formulation: With patient Time For Goal Achievement: 01/19/20 Potential to Achieve Goals: Fair    Frequency Min 3X/week   Barriers to discharge        Co-evaluation               AM-PAC PT "6 Clicks" Mobility  Outcome Measure Help needed turning from your back to your side while in a flat bed without using bedrails?: None Help needed moving from lying on your back to sitting on the side of a flat bed without using bedrails?: A Little Help needed moving to and from a bed to a chair (including a wheelchair)?: A Lot Help needed standing up from a chair using your arms (e.g., wheelchair or bedside chair)?: A Lot Help needed to walk in hospital room?: A Lot Help needed climbing 3-5 steps with a railing? : A Lot 6 Click Score: 15    End of Session Equipment Utilized During Treatment:  Oxygen Activity Tolerance: Patient tolerated treatment well;Patient limited by fatigue Patient left: in chair;with call bell/phone within reach;with chair alarm set Nurse Communication: Mobility status PT Visit Diagnosis: Unsteadiness on feet (R26.81);Other abnormalities of gait and mobility (R26.89);Muscle weakness (generalized) (M62.81)    Time: 9485-4627 PT Time Calculation (min) (ACUTE ONLY): 23 min   Charges:   PT Evaluation $PT Eval Moderate Complexity: 1 Mod PT Treatments $Therapeutic Activity: 23-37 mins        12:19 PM, 01/05/20 Lonell Grandchild, MPT Physical Therapist with [facility] [phone] office [phone] mobile phone

## 2020-01-05 NOTE — Plan of Care (Signed)
  Problem: Acute Rehab PT Goals(only PT should resolve) Goal: Pt Will Go Supine/Side To Sit Outcome: Progressing Flowsheets (Taken 01/05/2020 1220) Pt will go Supine/Side to Sit: with min guard assist Goal: Pt Will Go Sit To Supine/Side 01/05/2020 1221 by Ocie Bob, PT Flowsheets (Taken 01/05/2020 1221) Pt will go Sit to Supine/Side: with min guard assist 01/05/2020 1220 by Ocie Bob, PT Outcome: Progressing Goal: Patient Will Transfer Sit To/From Stand 01/05/2020 1221 by Ocie Bob, PT Flowsheets (Taken 01/05/2020 1221) Patient will transfer sit to/from stand: with minimal assist 01/05/2020 1220 by Ocie Bob, PT Outcome: Progressing Goal: Pt Will Transfer Bed To Chair/Chair To Bed 01/05/2020 1221 by Ocie Bob, PT Flowsheets (Taken 01/05/2020 1221) Pt will Transfer Bed to Chair/Chair to Bed: with min assist 01/05/2020 1220 by Ocie Bob, PT Outcome: Progressing Goal: Pt Will Ambulate 01/05/2020 1221 by Ocie Bob, PT Flowsheets (Taken 01/05/2020 1221) Pt will Ambulate:  50 feet  with minimal assist  with rolling walker 01/05/2020 1220 by Ocie Bob, PT Outcome: Progressing Goal: Pt/caregiver will Perform Home Exercise Program 01/05/2020 1221 by Ocie Bob, PT Flowsheets (Taken 01/05/2020 1221) Pt/caregiver will Perform Home Exercise Program: With Supervision, verbal cues required/provided 01/05/2020 1220 by Ocie Bob, PT Outcome: Progressing   12:21 PM, 01/05/20 Ocie Bob, MPT Physical Therapist with [facility] [phone] office [phone] mobile phone

## 2020-01-05 NOTE — Progress Notes (Signed)
PROGRESS NOTE    Jeffrey [name]  OVF:[id number] DOB: 1961-01-29 DOA: 12/24/2019 PCP: Gareth Morgan, MD    Brief Narrative:  59 year old male with history of COPD is chronically on 3 to 4 L of oxygen, admitted with worsening shortness of breath.  Felt to be related to COPD exacerbation.  He was intubated in the emergency room and admitted to the ICU.  Fortunately, he was able to extubate on 2/2.  Hospital course further complicated by development of acute metabolic encephalopathy.  Etiology is unclear, but may be related to high-dose steroids.  Required Precedex infusion.  Steroids have been tapered off ---Mental status overall much improved,  -Was able to wean off Precedex drip on 01/04/2020-   Assessment & Plan:   Active Problems:   Bipolar disorder with severe depression (HCC)   Essential hypertension   COPD with acute exacerbation (HCC)   Acute on chronic respiratory failure with hypercapnia (HCC)   Acute on chronic respiratory failure with hypoxia and hypercapnia (HCC)   Lobar pneumonia (HCC)   Acute metabolic encephalopathy   Acute on chronic diastolic CHF (congestive heart failure) (HCC)   Aspiration pneumonitis (HCC)   1)Acute on chronic respiratory failure with hypoxia-ABG suggested mild hypercapnia as well----- secondary to COPD exacerbation and aspiration pneumonia.  Patient was intubated on admission (12/24/19), but was able to extubate 12/26/19.  Overall respiratory status appears to be stable.  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 twice daily  3)Acute COPD exacerbation secondary to presumed aspiration pneumonia --- extubated 12/26/2019 , c =-ABG was suggestive of hypoxia and hypercapnia, completed antibiotics already, prednisone discontinued 01/04/2020  4)Bipolar Disorder-  Continue home psychotropic medications including olanzapine and Depakote, also continue Prozac, lorazepam and trazodone,--- additional psych medication adjustments as above  5)Hypertension---.  Stable on metoprolol 25 mg  twice daily  6)Dysphonia/Dysphagia--- patient voice is very soft/muffled apparently this is not new -Speech pathology eval appreciated recommends-Dysphagia 3 (mechanical soft); liquids have been changed to thin liquids as of 01/05/2020 from nectar-thick liquid  7)[name]--stable, continue atorvastatin and aspirin  8)Social/Ethics-- Discussed with Daughter [name] [phone] [facility]), Pt is a Full code  9)Tobacco Abuse----smoking cessation  DVT prophylaxis: Lovenox Code Status: Full code  Family Communication: Discussed with daughter over the phone who is the POA---- [phone]  Disposition Plan: PT eval appreciated awaiting transfer to SNF when bed available  Consultants:   Pulmonology  Psychiatry  Procedures:   Intubation 12/24/19  Extubation- 12/26/19  Antimicrobials:   Zosyn 1/31> 2/7  Other--off Precedex drip on 01/04/2020  Subjective: -Resting comfortably, more cooperative, more oriented, -Oral intake improving Objective: Vitals:   01/05/20 1400 01/05/20 1500 01/05/20 1555 01/05/20 1632  BP: 121/69 124/68  108/61  Pulse: (!)  104 79  83  Resp: (!) 30 (!) 35  (!) 22  Temp:   98.7 F (37.1 C)   TempSrc:   Oral   SpO2:  95%  96%  Weight:      Height:        Intake/Output Summary (Last 24 hours) at 01/05/2020 1831 Last data filed at 01/05/2020 1242 Gross per 24 hour  Intake 360 ml  Output 400 ml  Net -40 ml   Filed Weights   01/03/20 0600 01/04/20 0500 01/05/20 0500  Weight: 66.5 kg 66.2 kg 66.5 kg    Examination:  General exam: Alert, awake, no distress Nose- [place] 3L/min Respiratory system: Fair air movement, no wheezing  cardiovascular system:RRR. No murmurs, rubs, gallops. Gastrointestinal system: Abdomen is nondistended, soft and nontender.   Normal bowel sounds heard. Central nervous system: No focal neurological deficits, muffed/soft voice, moving all extremities spontaneously Extremities: No C/C/E, +pedal pulses Skin: No rashes, lesions or ulcers Psychiatry: Calm and cooperative  Data Reviewed: CBC: Recent Labs  Lab 12/30/19 0453 01/03/20 0411  WBC 5.0 6.1  HGB 10.9* 11.3*  HCT 34.8* 35.6*  MCV 91.8 90.1  PLT 208 319   Basic Metabolic Panel: Recent Labs  Lab 12/30/19 0453 01/01/20 0422 01/03/20 0411  NA 141 142 141  K 4.0 3.3* 3.4*  CL 99 101 99  CO2 33* 32 34*  GLUCOSE 115* 84 70  BUN 22* 16 7  CREATININE 0.75 0.65 0.62  CALCIUM 8.7* 8.5* 8.6*   GFR: Estimated Creatinine Clearance: 93.5 mL/min (by C-G formula based on SCr of 0.62 mg/dL). Liver Function Tests: Recent Labs  Lab 12/30/19 0453  AST 20  ALT 22  ALKPHOS 52  BILITOT 0.7  PROT 5.5*  ALBUMIN 2.7*   No results for input(s): LIPASE, AMYLASE in the last 168 hours. No results for input(s): AMMONIA in the last 168 hours. Coagulation Profile: No results for input(s): INR, PROTIME in the last 168 hours. Cardiac Enzymes: No results for input(s): CKTOTAL, CKMB, CKMBINDEX, TROPONINI in the last 168 hours. BNP (last 3 results) No results for input(s): PROBNP in the last 8760 hours. HbA1C: No results for  input(s): HGBA1C in the last 72 hours. CBG: No results for input(s): GLUCAP in the last 168 hours. Lipid Profile: No results for input(s): CHOL, HDL, LDLCALC, TRIG, CHOLHDL, LDLDIRECT in the last 72 hours. Thyroid Function Tests: No results for input(s): TSH, T4TOTAL, FREET4, T3FREE, THYROIDAB in the last 72 hours. Anemia Panel: No results for input(s): VITAMINB12, FOLATE, FERRITIN, TIBC, IRON, RETICCTPCT in the last 72 hours. Sepsis Labs: No results for input(s): PROCALCITON, LATICACIDVEN in the last 168 hours.  No results found for this or any previous visit (from the past 240 hour(s)).   Radiology Studies: No results found.  Scheduled Meds: . aspirin EC  81 mg Oral QPM  . atorvastatin  20 mg Oral QPM  . budesonide (PULMICORT) nebulizer solution  0.5 mg Nebulization BID  . Chlorhexidine Gluconate Cloth  6 each Topical Daily  . divalproex  1,500 mg Oral QHS  . enoxaparin (LOVENOX) injection  40 mg Subcutaneous Q24H  . feeding supplement (ENSURE ENLIVE)  237 mL Oral TID BM  . FLUoxetine  40 mg Oral Daily  . folic acid  1 mg Oral Daily  . ipratropium-albuterol  3 mL Nebulization TID  . LORazepam  1 mg Oral BID  . mouth rinse  15 mL Mouth Rinse BID  . metoprolol tartrate  25 mg Oral BID  . multivitamin with minerals  1 tablet Oral  Daily  . pantoprazole  40 mg Oral Daily  . QUEtiapine  25 mg Oral q morning - 10a  . QUEtiapine  50 mg Oral QHS  . thiamine  100 mg Oral Daily   Or  . thiamine  100 mg Intravenous Daily  . traZODone  150 mg Oral QHS   Continuous Infusions: . sodium chloride Stopped (01/01/20 1511)  . lactated ringers 10 mL/hr at 01/04/20 1200     LOS: 12 days   Roxan Hockey, MD [facility]   If 7PM-7AM, please contact night-coverage [email or web address]  01/05/2020, 6:31 PM

## 2020-01-05 NOTE — Progress Notes (Signed)
Speech Language Pathology Treatment: Dysphagia  Patient Details Name: Jeffrey [name] MRN: [id number] DOB: March 02, 1961 Today's Date: 01/05/2020 Time: 0865-7846 SLP Time Calculation (min) (ACUTE ONLY): 22 min  Assessment / Plan / Recommendation Clinical Impression  SLP provided ongoing diagnostic dysphagia therapy targeting trials of thin liquids and D3/mechanical soft. Pt presented today with increased s/sx of oropharyngeal dysphagia including inconsistent yet immediate overt coughing with 2/10 trials of thin liquids. No overt s/sx of aspiration were noted with trials of D3/mech soft (banana). He continues to present with aspiration risk factors including aphonic vocal quality and compromised respiratory status. SLP reviewed aspiration precautions including SMALL sips and pausing with liquid in oral cavity prior to a "hard swallow", Pt should be seated upright for all PO and take small sips. Despite cuing Pt was unable to vocalize with any of the various vowels Pt was encouraged to produce today; only whispers and moving his lips. Consider objective measure to objectively assess swallowing function and vocal cord function. ST will continue to follow   HPI HPI: This is a 59 year old man with severe COPD who is usually on 3 to 4 L/min of nasal cannula at home and who has history of diastolic congestive heart failure was admitted 12/24/19 with dyspnea and confusion.  He was intubated and mechanically ventilated fairly quickly in the emergency room. He was extubated 12/26/19. Acute on chronic respiratory failure with hypoxia and hypercarbia .  This is from his severe COPD.  Now extubated.  Continue with steroids, antibiotics and bronchodilators.  Pulmonary following. Pt was seen for MBSS 02/27/20 during acute stay at [facility] and a mechanical soft diet with thin liquids was recommended (Pt refused puree/NTL prior to MBSS). BSE ordered      SLP Plan  Continue with current plan of care        Recommendations  Diet recommendations: Thin liquid;Dysphagia 3 (mechanical soft) Liquids provided via: Cup;Straw Medication Administration: Whole meds with puree Supervision: Patient able to self feed;Staff to assist with self feeding;Full supervision/cueing for compensatory strategies Compensations: Slow rate;Small sips/bites Postural Changes and/or Swallow Maneuvers: Seated upright 90 degrees;Upright 30-60 min after meal                Oral Care Recommendations: Oral care BID;Staff/trained caregiver to provide oral care Follow up Recommendations: 24 hour supervision/assistance SLP Visit Diagnosis: Dysphagia, unspecified (R13.10) Plan: Continue with current plan of care       [name] H. Romie Levee, CCC-SLP Speech Language Pathologist   Georgetta Haber 01/05/2020, 11:37 AM

## 2020-01-05 NOTE — Progress Notes (Signed)
Nutrition Follow-up  DOCUMENTATION CODES:   Not applicable  INTERVENTION:  Continue Magic cup TID with meals, each supplement provides 290 kcal and 9 grams of protein  Continue MVI daily  Increase Ensure Enlive TID, each supplement provides 350 kcal and 20 grams of protein   NUTRITION DIAGNOSIS:   Inadequate oral intake related to inability to eat as evidenced by NPO status.  Addressing via nutrition supplements  GOAL:   Provide needs based on ASPEN/SCCM guidelines  Met  MONITOR:   Vent status, Labs, Weight trends, I & O's, TF tolerance  REASON FOR ASSESSMENT:   Consult Assessment of nutrition requirement/status(Tube feeding recommendations)  ASSESSMENT:  RD working remotely.  Patient is a 59 yo male with hx of COPD( Chronic O2), CHF, and recent pneumonia. He presents with short of breath. Failed BiPAP and is now intubated.  1/31 Admit 2/1 Intubated 2/2 Extubated 2/4 Diet advanced - dysphagia 3; nectar thick  Per notes mental status overall much improved and weaned off Precedex drip on 2/11  Patient with ongoing poor meal intake, consuming 25-50% of the last 6 documented meals. Per chart, patient is consuming Ensure supplements. Will increase supplement to three times daily and continue to provide magic cup on meal trays.   Medications reviewed and include: Folvite, MVI, Thiamine, Protonix  No new labs for review at this time 2/10 - K 3.4 (L)  Diet Order:   Diet Order            DIET DYS 3 Room service appropriate? Yes; Fluid consistency: Thin  Diet effective now              EDUCATION NEEDS:   No education needs have been identified at this time  Skin:  Skin Assessment: Reviewed RN Assessment(MASD-sacrum)  Last BM:  2/8 type 6  Height:   Ht Readings from Last 1 Encounters:  12/25/19 _0  (1.778 m)    Weight:   Wt Readings from Last 1 Encounters:  01/05/20 66.5 kg    Ideal Body Weight:  75.5 kg  BMI:  Body mass index is 21.04  kg/m.  Estimated Nutritional Needs:   Kcal:  1800-2000  Protein:  90-100  Fluid:  >1800 ml daily   Lajuan Lines, RD, LDN Clinical Nutrition [facility] Telephone [phone] After Hours/Weekend Pager: [phone]

## 2020-01-06 LAB — BASIC METABOLIC PANEL
Anion gap: 11 (ref 5–15)
BUN: 8 mg/dL (ref 6–20)
CO2: 35 mmol/L — ABNORMAL HIGH (ref 22–32)
Calcium: 9 mg/dL (ref 8.9–10.3)
Chloride: 99 mmol/L (ref 98–111)
Creatinine, Ser: 0.7 mg/dL (ref 0.61–1.24)
GFR calc Af Amer: >60 mL/min (ref >60)
GFR calc non Af Amer: >60 mL/min (ref >60)
Glucose, Bld: 77 mg/dL (ref 70–99)
Potassium: 3.5 mmol/L (ref 3.5–5.1)
Sodium: 145 mmol/L (ref 135–145)

## 2020-01-06 LAB — CBC
HCT: 38.4 % — ABNORMAL LOW (ref 39.0–52.0)
Hemoglobin: 11.9 g/dL — ABNORMAL LOW (ref 13.0–17.0)
MCH: 28.9 pg (ref 26.0–34.0)
MCHC: 31 g/dL (ref 30.0–36.0)
MCV: 93.2 fL (ref 80.0–100.0)
Platelets: 326 10*3/uL (ref 150–400)
RBC: 4.12 MIL/uL — ABNORMAL LOW (ref 4.22–5.81)
RDW: 13.1 % (ref 11.5–15.5)
WBC: 8.9 10*3/uL (ref 4.0–10.5)
nRBC: 0 % (ref 0.0–0.2)

## 2020-01-06 MED ORDER — SODIUM CHLORIDE 0.9% FLUSH
3.0000 mL | Freq: Two times a day (BID) | INTRAVENOUS | Status: DC
  Administered 2020-01-06 – 2020-01-09 (×6): 3 mL via INTRAVENOUS

## 2020-01-06 NOTE — Progress Notes (Signed)
PROGRESS NOTE    Jeffrey [name]  AOZ:[id number] DOB: 1961/02/18 DOA: 12/24/2019 PCP: Jeffrey Morgan, MD    Brief Narrative:  59 year old male with history of COPD is chronically on 3 to 4 L of oxygen, admitted with worsening shortness of breath.  Felt to be related to COPD exacerbation.  He was intubated in the emergency room and admitted to the ICU.  Fortunately, he was able to extubate on 2/2.  Hospital course further complicated by development of acute metabolic encephalopathy.  Etiology is unclear, but may be related to high-dose steroids.  Required Precedex infusion.  Steroids have been tapered off ---Mental status overall much improved,  -Patient is cooperative, talkative and mostly appropriate -Was able to wean off Precedex drip on 01/04/2020-   Assessment & Plan:   Active Problems:   Bipolar disorder with severe depression (HCC)   Essential hypertension   COPD with acute exacerbation (HCC)   Acute on chronic respiratory failure with hypercapnia (HCC)   Acute on chronic respiratory failure with hypoxia and hypercapnia (HCC)   Lobar pneumonia (HCC)   Acute metabolic encephalopathy   Acute on chronic diastolic CHF (congestive heart failure) (HCC)   Aspiration pneumonitis (HCC)   1)Acute on chronic respiratory failure with hypoxia-ABG suggested mild hypercapnia as well----- secondary to COPD exacerbation and aspiration pneumonia.  Patient was intubated on admission (12/24/19), but was able to extubate 12/26/19.  Overall respiratory status appears to be stable.  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 twice daily  3)Acute COPD exacerbation secondary to presumed aspiration pneumonia --- extubated 12/26/2019 , c =-ABG was suggestive of hypoxia and hypercapnia, completed antibiotics already, prednisone discontinued 01/04/2020  4)Bipolar Disorder-  Continue home psychotropic medications including olanzapine and Depakote, also continue Prozac, lorazepam and trazodone,--- additional psych medication adjustments as above ----Mental status overall much improved,  -Patient is cooperative, talkative and mostly appropriate  5)Hypertension---.  Stable on metoprolol 25 mg  twice daily  6)Dysphonia/Dysphagia--- patient voice is very soft/muffled apparently this is not new -Speech pathology eval appreciated recommends-Dysphagia 3 (mechanical soft); liquids have been changed to thin liquids as of 01/05/2020 from nectar-thick liquid  7)[name]--stable, continue atorvastatin and aspirin  8)Social/Ethics-- Discussed with Daughter Jeffrey [name] -[phone] [facility]), Pt is a Full code  9)Tobacco Abuse----smoking cessation  DVT prophylaxis: Lovenox Code Status: Full code  Family Communication: Discussed with daughter over the phone who is the POA---- [phone]  Disposition Plan: PT eval appreciated awaiting transfer to SNF when bed available  Consultants:   Pulmonology  Psychiatry  Procedures:   Intubation 12/24/19  Extubation- 12/26/19  Antimicrobials:   Zosyn 1/31> 2/7  Other--off Precedex drip on 01/04/2020  Subjective:  ---Mental status overall  much improved,  -Patient is cooperative, talkative and mostly appropriate  Objective: Vitals:  01/06/20 1500 01/06/20 1602 01/06/20 1700 01/06/20 1800  BP: (!) 88/50 (!) 94/50 (!) 99/58 118/74  Pulse: 73 79 81 80  Resp: (!) 26 (!) 32 (!) 22 (!) 24  Temp:   (!) 97.4 F (36.3 C)   TempSrc:   Axillary   SpO2: 95% 97% 99% 97%  Weight:      Height:       No intake or output data in the 24 hours ending 01/06/20 1824 Filed Weights   01/04/20 0500 01/05/20 0500 01/06/20 0500  Weight: 66.2 kg 66.5 kg 64.9 kg    Examination:  General exam: Alert, awake, no distress Nose- [name] 2L/min Respiratory system: Fair air movement, no wheezing  cardiovascular system:RRR. No murmurs, rubs, gallops. Gastrointestinal system: Abdomen is nondistended, soft and nontender.   Normal bowel sounds heard. Central nervous system: No focal neurological deficits, muffed/soft voice, moving all extremities spontaneously Extremities: No C/C/E, +pedal pulses Skin: No rashes, lesions or ulcers Psychiatry: ---Mental status overall much improved, -Patient is cooperative, talkative and mostly appropriate  Data Reviewed: CBC: Recent Labs  Lab 01/03/20 0411 01/06/20 0456  WBC 6.1 8.9  HGB 11.3* 11.9*  HCT 35.6* 38.4*  MCV 90.1 93.2  PLT 319 326   Basic Metabolic Panel: Recent Labs  Lab 01/01/20 0422 01/03/20 0411 01/06/20 0456  NA 142 141 145  K 3.3* 3.4* 3.5  CL 101 99 99  CO2 32 34* 35*  GLUCOSE 84 70 77  BUN 16 7 8   CREATININE 0.65 0.62 0.70  CALCIUM 8.5* 8.6* 9.0   GFR: Estimated Creatinine Clearance: 91.3 mL/min (by C-G formula based on SCr of 0.7 mg/dL). Liver Function Tests: No results for input(s): AST, ALT, ALKPHOS, BILITOT, PROT, ALBUMIN in the last 168 hours. No results for input(s): LIPASE, AMYLASE in the last 168 hours. No results for input(s): AMMONIA in the last 168 hours. Coagulation Profile: No results for input(s): INR, PROTIME in the last 168 hours. Cardiac Enzymes: No  results for input(s): CKTOTAL, CKMB, CKMBINDEX, TROPONINI in the last 168 hours. BNP (last 3 results) No results for input(s): PROBNP in the last 8760 hours. HbA1C: No results for input(s): HGBA1C in the last 72 hours. CBG: No results for input(s): GLUCAP in the last 168 hours. Lipid Profile: No results for input(s): CHOL, HDL, LDLCALC, TRIG, CHOLHDL, LDLDIRECT in the last 72 hours. Thyroid Function Tests: No results for input(s): TSH, T4TOTAL, FREET4, T3FREE, THYROIDAB in the last 72 hours. Anemia Panel: No results for input(s): VITAMINB12, FOLATE, FERRITIN, TIBC, IRON, RETICCTPCT in the last 72 hours. Sepsis Labs: No results for input(s): PROCALCITON, LATICACIDVEN in the last 168 hours.  No results found for this or any previous visit (from the past 240 hour(s)).   Radiology Studies: No results found.  Scheduled Meds: . aspirin EC  81 mg Oral QPM  . atorvastatin  20 mg Oral QPM  . budesonide (PULMICORT) nebulizer solution  0.5 mg Nebulization BID  . Chlorhexidine Gluconate Cloth  6 each Topical Daily  . divalproex  1,500 mg Oral QHS  . enoxaparin (LOVENOX) injection  40 mg Subcutaneous Q24H  . feeding supplement (ENSURE ENLIVE)  237 mL Oral TID BM  . FLUoxetine  40 mg Oral Daily  . folic acid  1 mg Oral Daily  . ipratropium-albuterol  3 mL Nebulization TID  . LORazepam  1 mg Oral BID  . mouth rinse  15 mL Mouth Rinse BID  . metoprolol tartrate  25 mg Oral BID  . multivitamin with minerals  1 tablet Oral Daily  . pantoprazole  40 mg Oral Daily  . QUEtiapine  25 mg Oral q morning - 10a  . QUEtiapine  50 mg Oral QHS  . thiamine  100 mg Oral Daily   Or  . thiamine  100 mg Intravenous Daily  . traZODone  150 mg Oral QHS   Continuous Infusions: . sodium chloride Stopped (01/01/20 1511)  . lactated ringers 10 mL/hr at 01/04/20 1200     LOS: 13 days   Roxan Hockey, MD [facility]   If 7PM-7AM, please contact night-coverage [email or web address]  01/06/2020, 6:24  PM

## 2020-01-06 NOTE — Progress Notes (Signed)
Pt sleeping. Nebulizer not given

## 2020-01-07 MED ORDER — METOPROLOL TARTRATE 25 MG PO TABS
12.5000 mg | ORAL_TABLET | Freq: Two times a day (BID) | ORAL | Status: DC
  Administered 2020-01-07 – 2020-01-10 (×5): 12.5 mg via ORAL
  Filled 2020-01-07 (×5): qty 1

## 2020-01-07 NOTE — Progress Notes (Addendum)
PROGRESS NOTE    Jeffrey [name]  KGY:[id number] DOB: June 27, 1961 DOA: 12/24/2019 PCP: Gareth Morgan, MD    Brief Narrative:  59 year old male with history of COPD is chronically on 3 to 4 L of oxygen, admitted with worsening shortness of breath.  Felt to be related to COPD exacerbation.  He was intubated in the emergency room and admitted to the ICU.  Fortunately, he was able to extubate on 2/2.  Hospital course further complicated by development of acute metabolic encephalopathy.  Etiology is unclear, but may be related to high-dose steroids.  Required Precedex infusion.  Steroids have been tapered off ---Mental status overall much improved,  -Patient is cooperative, talkative and mostly appropriate -Was able to wean off Precedex drip on 01/04/2020-  -Patient is medically stable for discharge -Awaiting transfer to SNF rehab when bed available  Assessment & Plan:   Active Problems:   Bipolar disorder with severe depression (HCC)   Essential hypertension   COPD with acute exacerbation (HCC)   Acute on chronic respiratory failure with hypercapnia (HCC)   Acute on chronic respiratory failure with hypoxia and hypercapnia (HCC)   Lobar pneumonia (HCC)   Acute metabolic encephalopathy   Acute on chronic diastolic CHF (congestive heart failure) (HCC)   Aspiration pneumonitis (HCC)   1)Acute on chronic respiratory failure with hypoxia-ABG suggested mild hypercapnia as well----- secondary to COPD exacerbation and aspiration pneumonia.  Patient was intubated on admission (12/24/19), but was able to extubate 12/26/19.  Overall respiratory status appears to be stable.  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 -No longer requiring IV medications for sedation -Patient is cooperative, talkative and mostly appropriate -Was able to wean off Precedex drip on 01/04/2020-  -Patient with history of EtOH abuse, denies recent EtOH use -As per daughter Ms. 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 twice daily  3)Acute COPD exacerbation secondary to presumed aspiration pneumonia --- extubated 12/26/2019 , c =-ABG was suggestive of hypoxia and hypercapnia, completed antibiotics already, prednisone discontinued 01/04/2020  4)Bipolar Disorder-  Continue home psychotropic medications including olanzapine and Depakote, also continue Prozac, lorazepam and trazodone,--- additional psych medication adjustments as above ----Mental status overall much improved,   -Not requiring IV medications for sedation -Patient is cooperative, talkative and mostly appropriate  5)Hypertension---.  Decrease metoprolol to 12.5mg   twice daily, consider stopping metoprolol if BP remains soft  6)Dysphonia/Dysphagia--- patient voice is very soft/muffled apparently this is not new (initially worsened post intubation) -Speech pathology eval appreciated recommends-Dysphagia 3 (mechanical soft); liquids have been changed to thin liquids as of 01/05/2020 from nectar-thick liquid  7)[name]--stable, continue atorvastatin and aspirin  8)Social/Ethics-- Discussed with Daughter [name] -[phone] [facility]), Pt is a Full code  9)Tobacco Abuse----smoking cessation  DVT prophylaxis: Lovenox Code Status: Full code  Family Communication: Discussed with daughter over the phone who is the POA---- [phone]  Disposition Plan: --- -Mental status  overall much improved,  -Patient is cooperative, talkative and mostly appropriate -Was able to wean off Precedex drip on 01/04/2020-  -  Not requiring IV medications for sedation -Patient is medically stable for discharge -Awaiting transfer to SNF rehab when bed available  Consultants:   Pulmonology  Psychiatry  Procedures:   Intubation 12/24/19  Extubation- 12/26/19  Antimicrobials:   Zosyn 1/31> 2/7  Other--off Precedex drip on 01/04/2020  Subjective:  ---Mental status overall much improved,  -Patient is cooperative, talkative and mostly appropriate -Oral intake is fair, -No new concerns  Objective: Vitals:   01/07/20 1002 01/07/20 1100 01/07/20 1129 01/07/20 1600  BP: (!) 98/57 91/61  102/60  Pulse: 83 76 81 71  Resp: (!) 21 (!) 22 (!) 23 (!) 27  Temp:   98.7 F (37.1 C)   TempSrc:   Axillary   SpO2: 97% 94% 97% 97%  Weight:      Height:        Intake/Output Summary (Last 24 hours) at 01/07/2020 1708 Last data filed at 01/07/2020 1200 Gross per 24 hour  Intake 443 ml  Output --  Net 443 ml   Filed Weights   01/04/20 0500 01/05/20 0500 01/06/20 0500  Weight: 66.2 kg 66.5 kg 64.9 kg    Examination:  General exam: Alert, awake, no distress Nose- [place] 2L/min Respiratory system: Fair air movement, no wheezing  cardiovascular system:RRR. No murmurs, rubs, gallops. Gastrointestinal system: Abdomen is nondistended, soft and nontender.   Normal bowel sounds heard. Central nervous system: No focal neurological deficits, muffed/soft voice, moving all extremities spontaneously Extremities: No C/C/E, +pedal pulses Skin: No rashes, lesions or ulcers Psychiatry: ---Mental status overall much improved, -Patient is cooperative, talkative and mostly appropriate  Data Reviewed: CBC: Recent Labs  Lab 01/03/20 0411 01/06/20 0456  WBC 6.1 8.9  HGB 11.3* 11.9*  HCT 35.6* 38.4*  MCV 90.1 93.2  PLT 319 353   Basic Metabolic Panel: Recent Labs  Lab 01/01/20 0422  01/03/20 0411 01/06/20 0456  NA 142 141 145  K 3.3* 3.4* 3.5  CL 101 99 99  CO2 32 34* 35*  GLUCOSE 84 70 77  BUN 16 7 8   CREATININE 0.65 0.62 0.70  CALCIUM 8.5* 8.6* 9.0   GFR: Estimated Creatinine Clearance: 91.3 mL/min (by C-G formula based on SCr of 0.7 mg/dL). Liver Function Tests: No results for input(s): AST, ALT, ALKPHOS, BILITOT, PROT, ALBUMIN in the last 168 hours. No results for input(s): LIPASE, AMYLASE in the last 168 hours. No results for input(s): AMMONIA in the last 168 hours. Coagulation Profile: No results for input(s): INR, PROTIME in the last 168 hours. Cardiac Enzymes: No results for input(s): CKTOTAL, CKMB, CKMBINDEX, TROPONINI in the last 168 hours. BNP (last 3 results) No results for input(s): PROBNP in the last 8760 hours. HbA1C: No results for input(s): HGBA1C in the last 72 hours. CBG: No results for input(s): GLUCAP in the last 168 hours. Lipid Profile: No results for input(s): CHOL, HDL, LDLCALC, TRIG, CHOLHDL, LDLDIRECT in the last 72 hours. Thyroid Function Tests: No results for input(s): TSH, T4TOTAL, FREET4, T3FREE, THYROIDAB in the last 72 hours. Anemia Panel: No results for input(s): VITAMINB12, FOLATE, FERRITIN, TIBC, IRON, RETICCTPCT in the last 72 hours. Sepsis Labs: No results for input(s): PROCALCITON, LATICACIDVEN in the last 168 hours.  No results found for this or any previous visit (from the past 240 hour(s)).   Radiology Studies: No results found.  Scheduled Meds: . aspirin EC  81 mg Oral QPM  . atorvastatin  20 mg Oral QPM  . budesonide (PULMICORT) nebulizer solution  0.5 mg Nebulization BID  . Chlorhexidine Gluconate  Cloth  6 each Topical Daily  . divalproex  1,500 mg Oral QHS  . enoxaparin (LOVENOX) injection  40 mg Subcutaneous Q24H  . feeding supplement (ENSURE ENLIVE)  237 mL Oral TID BM  . FLUoxetine  40 mg Oral Daily  . folic acid  1 mg Oral Daily  . ipratropium-albuterol  3 mL Nebulization TID  . LORazepam  1  mg Oral BID  . mouth rinse  15 mL Mouth Rinse BID  . metoprolol tartrate  12.5 mg Oral BID  . multivitamin with minerals  1 tablet Oral Daily  . pantoprazole  40 mg Oral Daily  . QUEtiapine  25 mg Oral q morning - 10a  . QUEtiapine  50 mg Oral QHS  . sodium chloride flush  3 mL Intravenous Q12H  . thiamine  100 mg Oral Daily   Or  . thiamine  100 mg Intravenous Daily   Continuous Infusions: . sodium chloride Stopped (01/01/20 1511)     LOS: 14 days   Shon Hale, MD [facility]   If 7PM-7AM, please contact night-coverage [email or web address]  01/07/2020, 5:08 PM

## 2020-01-08 LAB — CBC
HCT: 34.1 % — ABNORMAL LOW (ref 39.0–52.0)
Hemoglobin: 10.7 g/dL — ABNORMAL LOW (ref 13.0–17.0)
MCH: 29 pg (ref 26.0–34.0)
MCHC: 31.4 g/dL (ref 30.0–36.0)
MCV: 92.4 fL (ref 80.0–100.0)
Platelets: 307 10*3/uL (ref 150–400)
RBC: 3.69 MIL/uL — ABNORMAL LOW (ref 4.22–5.81)
RDW: 12.7 % (ref 11.5–15.5)
WBC: 6.2 10*3/uL (ref 4.0–10.5)
nRBC: 0 % (ref 0.0–0.2)

## 2020-01-08 LAB — BASIC METABOLIC PANEL
Anion gap: 9 (ref 5–15)
BUN: 17 mg/dL (ref 6–20)
CO2: 34 mmol/L — ABNORMAL HIGH (ref 22–32)
Calcium: 8.7 mg/dL — ABNORMAL LOW (ref 8.9–10.3)
Chloride: 99 mmol/L (ref 98–111)
Creatinine, Ser: 0.67 mg/dL (ref 0.61–1.24)
GFR calc Af Amer: >60 mL/min (ref >60)
GFR calc non Af Amer: >60 mL/min (ref >60)
Glucose, Bld: 90 mg/dL (ref 70–99)
Potassium: 3.3 mmol/L — ABNORMAL LOW (ref 3.5–5.1)
Sodium: 142 mmol/L (ref 135–145)

## 2020-01-08 MED ORDER — OLANZAPINE 5 MG PO TBDP
5.0000 mg | ORAL_TABLET | Freq: Two times a day (BID) | ORAL | Status: DC | PRN
  Filled 2020-01-08 (×3): qty 1

## 2020-01-08 NOTE — Progress Notes (Signed)
Speech Language Pathology Treatment: Dysphagia  Patient Details Name: Jeffrey [name] MRN: [id number] DOB: 1961-05-03 Today's Date: 01/08/2020 Time: 5284-1324 SLP Time Calculation (min) (ACUTE ONLY): 11 min  Assessment / Plan / Recommendation Clinical Impression  Pt lethargic at this time, reportedly given medications due to agitation earlier. Pt with max verbal and tactile cues for alertness and sips of thin. He reportedly fed himself over the weekend without incident. Unable to work extensively with Pt today due to lethargy.   HPI HPI: This is a 59 year old man with severe COPD who is usually on 3 to 4 L/min of nasal cannula at home and who has history of diastolic congestive heart failure was admitted 12/24/19 with dyspnea and confusion.  He was intubated and mechanically ventilated fairly quickly in the emergency room. He was extubated 12/26/19. Acute on chronic respiratory failure with hypoxia and hypercarbia .  This is from his severe COPD.  Now extubated.  Continue with steroids, antibiotics and bronchodilators.  Pulmonary following. Pt was seen for MBSS 02/27/20 during acute stay at [facility] and a mechanical soft diet with thin liquids was recommended (Pt refused puree/NTL prior to MBSS). BSE ordered      SLP Plan  Continue with current plan of care       Recommendations  Diet recommendations: Dysphagia 3 (mechanical soft);Thin liquid Liquids provided via: Cup;Straw Medication Administration: Whole meds with puree Supervision: Patient able to self feed;Staff to assist with self feeding;Full supervision/cueing for compensatory strategies Compensations: Slow rate;Small sips/bites Postural Changes and/or Swallow Maneuvers: Seated upright 90 degrees;Upright 30-60 min after meal                Oral Care Recommendations: Oral care BID;Staff/trained caregiver to provide oral care Follow up Recommendations: 24 hour supervision/assistance SLP Visit Diagnosis: Dysphagia, unspecified  (R13.10) Plan: Continue with current plan of care       Thank you,  Havery Moros, CCC-SLP [phone]                 Jae Skeet 01/08/2020, 2:10 PM

## 2020-01-08 NOTE — Progress Notes (Signed)
Spoke with daughter, [name], and gave update.  Answered all questions that she had.  Let patient know that I spoke to his daughter and gave update.

## 2020-01-08 NOTE — Progress Notes (Signed)
PROGRESS NOTE    Jeffrey [name]  BPZ:[id number] DOB: [birth year] DOA: 12/24/2019 PCP: Lemmie Evens, MD    Brief Narrative:  59 year old male with history of COPD is chronically on 3 to 4 L of oxygen, admitted with worsening shortness of breath.  Felt to be related to COPD exacerbation.  He was intubated in the emergency room and admitted to the ICU.  Fortunately, he was able to extubate on 2/2.  Hospital course further complicated by development of acute metabolic encephalopathy.  Etiology is unclear, but may be related to high-dose steroids.  Required Precedex infusion.  Steroids have been tapered off ---Mental status overall much improved,  -Patient is cooperative, talkative and mostly appropriate -Was able to wean off Precedex drip on 01/04/2020-  -Patient is medically stable for discharge -Awaiting transfer to SNF rehab when bed available  Assessment & Plan:   Active Problems:   Bipolar disorder with severe depression ([place])   Essential hypertension   COPD with acute exacerbation (HCC)   Acute on chronic respiratory failure with hypercapnia (HCC)   Acute on chronic respiratory failure with hypoxia and hypercapnia (HCC)   Lobar pneumonia (HCC)   Acute metabolic encephalopathy   Acute on chronic diastolic CHF (congestive heart failure) (HCC)   Aspiration pneumonitis (HCC)   1)Acute on chronic respiratory failure with hypoxia-ABG suggested mild hypercapnia as well----- secondary to COPD exacerbation and aspiration pneumonia.  Patient was intubated on admission (12/24/19), but was able to extubate 12/26/19.  Overall respiratory status appears to be stable.  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 -No longer requiring IV medications for sedation -Patient is cooperative, talkative and mostly appropriate -Was able to wean off Precedex drip on 01/04/2020-  -Patient with history of EtOH abuse, denies recent EtOH use -As per daughter Ms. 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 twice daily -May use as needed ODT Zyprexa  3)Acute COPD exacerbation secondary to presumed aspiration pneumonia --- extubated 12/26/2019 , c =-ABG was suggestive of hypoxia and hypercapnia, completed antibiotics already, prednisone discontinued 01/04/2020  4)Bipolar Disorder-  Continue home psychotropic medications including olanzapine and Depakote, also continue Prozac, lorazepam and trazodone,--- additional psych medication adjustments as above ----Mental status overall much improved,   -Not requiring IV medications for sedation -Patient is cooperative, talkative and mostly appropriate  5)Hypertension---.  Decrease metoprolol to 12.5mg   twice daily, consider stopping metoprolol if BP remains soft  6)Dysphonia/Dysphagia--- patient voice is very soft/muffled apparently this is not new (initially worsened post intubation) -Speech pathology eval appreciated recommends-Dysphagia 3 (mechanical soft); liquids have been changed to thin liquids as of 01/05/2020 from nectar-thick liquid  7)[name]--stable, continue atorvastatin and aspirin  8)Social/Ethics-- Discussed with Daughter [name] -[phone] [facility]), Pt is a Full code  9)Tobacco Abuse----smoking cessation  DVT prophylaxis: Lovenox Code Status: Full code  Family Communication: Discussed with daughter over the phone who is the POA----  [phone]  Disposition Plan: --- -Mental status overall much improved,  -Patient is cooperative, talkative and mostly appropriate -Was able to  wean off Precedex drip on 01/04/2020-  -Not requiring IV medications for sedation -Patient is medically stable for discharge -Awaiting transfer to SNF rehab when bed available  Consultants:   Pulmonology  Psychiatry  Procedures:   Intubation 12/24/19  Extubation- 12/26/19  Antimicrobials:   Zosyn 1/31> 2/7  Other--off Precedex drip on 01/04/2020  Subjective:  -Occasional episodes of restlessness overall remains more cooperative and redirectable  Objective: Vitals:   01/08/20 1600 01/08/20 1630 01/08/20 1700 01/08/20 1800  BP: (!) 95/56  115/74 123/66  Pulse: 73 73 74 77  Resp: (!) 24 20 (!) 24 (!) 23  Temp: 98 F (36.7 C)  97.9 F (36.6 C)   TempSrc: Axillary     SpO2: 95% 97% 97% 94%  Weight:      Height:        Intake/Output Summary (Last 24 hours) at 01/08/2020 2021 Last data filed at 01/08/2020 1200 Gross per 24 hour  Intake 923 ml  Output 400 ml  Net 523 ml   Filed Weights   01/05/20 0500 01/06/20 0500 01/08/20 0416  Weight: 66.5 kg 64.9 kg 64 kg    Examination:  General exam: Alert, awake, no distress Nose- [place] 2L/min Respiratory system: Fair air movement, no wheezing  cardiovascular system:RRR. No murmurs, rubs, gallops. Gastrointestinal system: Abdomen is nondistended, soft and nontender.   Normal bowel sounds heard. Central nervous system: No focal neurological deficits, muffed/soft voice, moving all extremities spontaneously Extremities: No C/C/E, +pedal pulses Skin: No rashes, lesions or ulcers Psychiatry: ---Mental status overall much improved, -Patient is cooperative, talkative and mostly appropriate  Data Reviewed: CBC: Recent Labs  Lab 01/03/20 0411 01/06/20 0456 01/08/20 0414  WBC 6.1 8.9 6.2  HGB 11.3* 11.9* 10.7*  HCT 35.6* 38.4* 34.1*  MCV 90.1 93.2 92.4  PLT 319 326 307   Basic  Metabolic Panel: Recent Labs  Lab 01/03/20 0411 01/06/20 0456 01/08/20 0414  NA 141 145 142  K 3.4* 3.5 3.3*  CL 99 99 99  CO2 34* 35* 34*  GLUCOSE 70 77 90  BUN 7 8 17   CREATININE 0.62 0.70 0.67  CALCIUM 8.6* 9.0 8.7*   GFR: Estimated Creatinine Clearance: 90 mL/min (by C-G formula based on SCr of 0.67 mg/dL). Liver Function Tests: No results for input(s): AST, ALT, ALKPHOS, BILITOT, PROT, ALBUMIN in the last 168 hours. No results for input(s): LIPASE, AMYLASE in the last 168 hours. No results for input(s): AMMONIA in the last 168 hours. Coagulation Profile: No results for input(s): INR, PROTIME in the last 168 hours. Cardiac Enzymes: No results for input(s): CKTOTAL, CKMB, CKMBINDEX, TROPONINI in the last 168 hours. BNP (last 3 results) No results for input(s): PROBNP in the last 8760 hours. HbA1C: No results for input(s): HGBA1C in the last 72 hours. CBG: No results for input(s): GLUCAP in the last 168 hours. Lipid Profile: No results for input(s): CHOL, HDL, LDLCALC, TRIG, CHOLHDL, LDLDIRECT in the last 72 hours. Thyroid Function Tests: No results for input(s): TSH, T4TOTAL, FREET4, T3FREE, THYROIDAB in the last 72 hours. Anemia Panel: No results for input(s): VITAMINB12, FOLATE, FERRITIN, TIBC, IRON, RETICCTPCT in the last 72 hours. Sepsis Labs: No results for input(s): PROCALCITON, LATICACIDVEN in the last 168 hours.  No results found for this or any previous visit (from the past 240 hour(s)).   Radiology Studies: No results found.  Scheduled Meds: . aspirin EC  81 mg Oral QPM  . atorvastatin  20 mg Oral QPM  . budesonide (PULMICORT) nebulizer solution  0.5  mg Nebulization BID  . Chlorhexidine Gluconate Cloth  6 each Topical Daily  . divalproex  1,500 mg Oral QHS  . enoxaparin (LOVENOX) injection  40 mg Subcutaneous Q24H  . feeding supplement (ENSURE ENLIVE)  237 mL Oral TID BM  . FLUoxetine  40 mg Oral Daily  . folic acid  1 mg Oral Daily  .  ipratropium-albuterol  3 mL Nebulization TID  . LORazepam  1 mg Oral BID  . mouth rinse  15 mL Mouth Rinse BID  . metoprolol tartrate  12.5 mg Oral BID  . multivitamin with minerals  1 tablet Oral Daily  . pantoprazole  40 mg Oral Daily  . QUEtiapine  25 mg Oral q morning - 10a  . QUEtiapine  50 mg Oral QHS  . sodium chloride flush  3 mL Intravenous Q12H  . thiamine  100 mg Oral Daily   Or  . thiamine  100 mg Intravenous Daily   Continuous Infusions: . sodium chloride Stopped (01/01/20 1511)     LOS: 15 days   Shon Hale, MD [facility]   If 7PM-7AM, please contact night-coverage [email or web address]  01/08/2020, 8:21 PM

## 2020-01-08 NOTE — Progress Notes (Signed)
Notified mid-level provider of patient aggressive spitting out medications and constantly trying to get out of bed to the point of RN sitting in room constantloy Order received and completed

## 2020-01-08 NOTE — TOC Progression Note (Signed)
Transition of Care [facility]) - Progression Note    Patient Details  Name: Jeffrey [name] MRN: [id number] Date of Birth: 07/09/1961  Transition of Care [facility]) CM/SW Contact  Leitha Bleak, RN Phone Number: 01/08/2020, 3:33 PM  Clinical Narrative:   Patient has weaned off precedex with improved behavior, sent out updated progress notes to SNF.     Expected Discharge Plan: Skilled Nursing Facility Barriers to Discharge: Continued Medical Work up  Expected Discharge Plan and Services Expected Discharge Plan: Skilled Nursing Facility    Living arrangements for the past 2 months: Single Family Home                  Readmission Risk Interventions Readmission Risk Prevention Plan 05/01/2019 03/30/2019 03/28/2019  Transportation Screening Complete - Complete  Medication Review [employer]) - - Complete  PCP or Specialist appointment within 3-5 days of discharge - Not Complete -  PCP/Specialist Appt Not Complete comments - recommended for 1 week f/u, PCP closed today, patient to call tomorrow and make appt.  -  HRI or Home Care Consult Complete - Complete  SW Recovery Care/Counseling Consult Complete - Complete  Palliative Care Screening Not Applicable - Not Applicable  Skilled Nursing Facility Not Applicable - Not Applicable  Some recent data might be hidden

## 2020-01-09 LAB — RESPIRATORY PANEL BY RT PCR (FLU A&B, COVID)
Influenza A by PCR: NEGATIVE
Influenza B by PCR: NEGATIVE
SARS Coronavirus 2 by RT PCR: NEGATIVE

## 2020-01-09 NOTE — Progress Notes (Signed)
PROGRESS NOTE    Jeffrey [name]  KWI:[id number] DOB: 05-Sep-1961 DOA: 12/24/2019 PCP: Gareth Morgan, MD   Brief Narrative:  59 year old male with history of COPD is chronically on 3 to 4 L of oxygen, admitted with worsening shortness of breath.  Felt to be related to COPD exacerbation.  He was intubated in the emergency room and admitted to the ICU.  Fortunately, he was able to extubate on 12/26/19.  Hospital course further complicated by development of acute metabolic encephalopathy.  Etiology is unclear, but may be related to high-dose steroids.  Required Precedex infusion.  Steroids have been tapered off ---Mental status overall much improved,  -Patient is cooperative, talkative and mostly appropriate -Was able to wean off Precedex drip on 01/04/2020-  -Patient is medically stable for discharge -Awaiting transfer to SNF rehab when bed available  Assessment & Plan:   Active Problems:   Bipolar disorder with severe depression (HCC)   Essential hypertension   COPD with acute exacerbation (HCC)   Acute on chronic respiratory failure with hypercapnia (HCC)   Acute on chronic respiratory failure with hypoxia and hypercapnia (HCC)   Lobar pneumonia (HCC)   Acute metabolic encephalopathy   Acute on chronic diastolic CHF (congestive heart failure) (HCC)   Aspiration pneumonitis (HCC)   1)Acute on chronic respiratory failure with hypoxia-ABG suggested mild hypercapnia as well----- secondary to COPD exacerbation and aspiration pneumonia.  Patient was intubated on admission (12/24/19), but was able to extubate 12/26/19.  Overall respiratory status appears to be stable.  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 -No longer requiring IV medications for sedation -Patient is cooperative, talkative and mostly appropriate -Was able to wean off Precedex drip on 01/04/2020-  -Patient with history of EtOH abuse, denies recent EtOH use -As per daughter Ms. 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 twice daily -May use as needed ODT Zyprexa - -Occasional episodes of restlessness, I have requested behavioral health/psychiatric input on further medication adjustment/management to optimize mood behavior  3)Acute COPD exacerbation secondary to presumed aspiration pneumonia --- extubated 12/26/2019 , c =-ABG was suggestive of hypoxia and hypercapnia, completed antibiotics already, prednisone discontinued 01/04/2020  4)Bipolar Disorder-  Continue home psychotropic medications including olanzapine and Depakote, also continue Prozac, lorazepam and trazodone,--- additional psych medication adjustments as above ----Mental status overall much improved,   -Not requiring IV medications for sedation -Patient is cooperative, talkative and mostly appropriate -- -Occasional episodes of restlessness, I have requested behavioral health/psychiatric input on further medication adjustment/management to optimize mood behavior 5)Hypertension---.  c/n metoprolol to 12.5mg   twice daily, consider stopping metoprolol if BP remains soft  6)Dysphonia/Dysphagia--- patient voice is very soft/muffled apparently this is not new (initially worsened post intubation) -Speech pathology eval appreciated recommends-Dysphagia 3 (mechanical soft); liquids have been changed to thin liquids as of 01/05/2020 from nectar-thick liquid  7)[name]--stable, continue  atorvastatin and aspirin  8)Social/Ethics-- Discussed with Daughter [name] -[phone] [facility]), Pt is a Full code   9)Tobacco Abuse----smoking cessation  DVT prophylaxis: Lovenox Code  Status: Full code  Family Communication: Discussed with daughter over the phone who is the POA---- [phone]  Disposition Plan: --- -Mental status overall much improved,  -Patient is cooperative, talkative and mostly appropriate -Was able to wean off Precedex drip on 01/04/2020-  -Not requiring IV medications for sedation -Patient is medically stable for discharge -- -Occasional episodes of restlessness, I have requested behavioral health/psychiatric input on further medication adjustment/management to optimize mood behavior -Awaiting transfer to SNF rehab when bed available  Consultants:   Pulmonology  Psychiatry  Procedures:   Intubation 12/24/19  Extubation- 12/26/19  Antimicrobials:   Zosyn 1/31> 2/7  Other--off Precedex drip on 01/04/2020  Subjective:  - overall remains more cooperative and redirectable -Did require restraints overnight---   -Occasional episodes of restlessness, I have requested behavioral health/psychiatric input on further medication adjustment/management to optimize mood behavior  Objective: Vitals:   01/09/20 0800 01/09/20 0900 01/09/20 1000 01/09/20 1038  BP: (!) 144/79 (!) 145/97 123/67   Pulse: 93 99 89   Resp: (!) 33 (!) 29 (!) 26   Temp:    98.6 F (37 C)  TempSrc:    Oral  SpO2: 98% 95% 98%   Weight:      Height:        Intake/Output Summary (Last 24 hours) at 01/09/2020 1124 Last data filed at 01/09/2020 1000 Gross per 24 hour  Intake 600 ml  Output 300 ml  Net 300 ml   Filed Weights   01/06/20 0500 01/08/20 0416 01/09/20 0500  Weight: 64.9 kg 64 kg 62.8 kg    Examination:  General exam: Alert, awake, no distress Nose-  2L/min Respiratory system: Fair air movement, no wheezing  cardiovascular system:RRR. No murmurs, rubs,  gallops. Gastrointestinal system: Abdomen is nondistended, soft and nontender.   Normal bowel sounds heard. Central nervous system: No focal neurological deficits, muffed/soft voice, moving all extremities spontaneously Extremities: No C/C/E, +pedal pulses Skin: No rashes, lesions or ulcers Psychiatry: ---Mental status overall much improved, -Patient is cooperative, talkative and mostly appropriate  Data Reviewed: CBC: Recent Labs  Lab 01/03/20 0411 01/06/20 0456 01/08/20 0414  WBC 6.1 8.9 6.2  HGB 11.3* 11.9* 10.7*  HCT 35.6* 38.4* 34.1*  MCV 90.1 93.2 92.4  PLT 319 326 564   Basic Metabolic Panel: Recent Labs  Lab 01/03/20 0411 01/06/20 0456 01/08/20 0414  NA 141 145 142  K 3.4* 3.5 3.3*  CL 99 99 99  CO2 34* 35* 34*  GLUCOSE 70 77 90  BUN 7 8 17   CREATININE 0.62 0.70 0.67  CALCIUM 8.6* 9.0 8.7*   GFR: Estimated Creatinine Clearance: 88.3 mL/min (by C-G formula based on SCr of 0.67 mg/dL). Liver Function Tests: No results for input(s): AST, ALT, ALKPHOS, BILITOT, PROT, ALBUMIN in the last 168 hours. No results for input(s): LIPASE, AMYLASE in the last 168 hours. No results for input(s): AMMONIA in the last 168 hours. Coagulation Profile: No results for input(s): INR, PROTIME in the last 168 hours. Cardiac Enzymes: No results for input(s): CKTOTAL, CKMB, CKMBINDEX, TROPONINI in the last 168 hours. BNP (last 3 results) No results for input(s): PROBNP in the last 8760 hours. HbA1C: No results for input(s): HGBA1C in the last 72 hours. CBG: No results for input(s): GLUCAP in the last 168 hours. Lipid Profile: No results for input(s): CHOL, HDL, LDLCALC, TRIG, CHOLHDL, LDLDIRECT in the last 72 hours. Thyroid Function Tests: No results for input(s): TSH, T4TOTAL, FREET4, T3FREE, THYROIDAB in the last 72 hours. Anemia Panel: No results  for input(s): VITAMINB12, FOLATE, FERRITIN, TIBC, IRON, RETICCTPCT in the last 72 hours. Sepsis Labs: No results for input(s):  PROCALCITON, LATICACIDVEN in the last 168 hours.  No results found for this or any previous visit (from the past 240 hour(s)).   Radiology Studies: No results found.  Scheduled Meds: . aspirin EC  81 mg Oral QPM  . atorvastatin  20 mg Oral QPM  . budesonide (PULMICORT) nebulizer solution  0.5 mg Nebulization BID  . Chlorhexidine Gluconate Cloth  6 each Topical Daily  . divalproex  1,500 mg Oral QHS  . enoxaparin (LOVENOX) injection  40 mg Subcutaneous Q24H  . feeding supplement (ENSURE ENLIVE)  237 mL Oral TID BM  . FLUoxetine  40 mg Oral Daily  . folic acid  1 mg Oral Daily  . ipratropium-albuterol  3 mL Nebulization TID  . LORazepam  1 mg Oral BID  . mouth rinse  15 mL Mouth Rinse BID  . metoprolol tartrate  12.5 mg Oral BID  . multivitamin with minerals  1 tablet Oral Daily  . pantoprazole  40 mg Oral Daily  . QUEtiapine  25 mg Oral q morning - 10a  . QUEtiapine  50 mg Oral QHS  . sodium chloride flush  3 mL Intravenous Q12H  . thiamine  100 mg Oral Daily   Or  . thiamine  100 mg Intravenous Daily   Continuous Infusions: . sodium chloride Stopped (01/01/20 1511)     LOS: 16 days   Shon Hale, MD [facility]   If 7PM-7AM, please contact night-coverage [email or web address]  01/09/2020, 11:24 AM

## 2020-01-09 NOTE — TOC Progression Note (Signed)
Transition of Care [facility]) - Progression Note    Patient Details  Name: Jeffrey [name] MRN: [id number] Date of Birth: 1960/12/30  Transition of Care [facility]) CM/SW Contact  Leitha Bleak, RN Phone Number: 01/09/2020, 2:33 PM  Clinical Narrative:   Patient has improved, using PO medication for agitation. Spoke with her daughter [name]. She said this is not his first time having these episode after being extubated. After a while he recovers. Discussed long term plan for patient.  Her intention is for him to go to SNF for short term rehab and return home with her.  She really wants him living with her where he get proper nutrition, medication and care.  But if he will have a choice, and he may choose to go back to live with his ex-wife. She will take him in as well. Progress notes sent 2/15 to SNF.  TOC to follow for SNF bed offer.     Expected Discharge Plan: Skilled Nursing Facility Barriers to Discharge: Continued Medical Work up  Expected Discharge Plan and Services Expected Discharge Plan: Skilled Nursing Facility       Living arrangements for the past 2 months: Single Family Home

## 2020-01-09 NOTE — Care Management Important Message (Signed)
Important Message  Patient Details  Name: Jeffrey [name] MRN: [id number] Date of Birth: 1961/05/27   Medicare Important Message Given:  Yes     Corey Harold 01/09/2020, 11:10 AM

## 2020-01-09 NOTE — Progress Notes (Signed)
Patient ID: Jeffrey [name], male   DOB: 03-04-1961, 59 y.o.   MRN: [id number]   I have reviewed patients medications. Per chart review, patient has some occasional episodes of restlessness. He is on a number of medications to include Seroquel, 25 mg po at 10 am and 50 mg po daily at bedtime. I will recommend his a.m. dose to be increased to 50 mg. I would also recommend repeating his valproic level as the last level was resulted on 12/24/2019. At that time, it was 31.

## 2020-01-09 NOTE — TOC Progression Note (Signed)
Transition of Care [facility]) - Progression Note    Patient Details  Name: Jeffrey [name] MRN: [id number] Date of Birth: June 10, 1961  [facility] Care [facility]) CM/SW Contact  Leitha Bleak, RN Phone Number: 01/09/2020, 3:09 PM  Clinical Narrative:    Bobbie Stack made a bed offer, Daughter accepts.  Debbie confirmed they will be ready in the AM. Repeating patients COVID test now.  Per MD patient will be medically ready. RN updated.    Expected Discharge Plan: Skilled Nursing Facility Barriers to Discharge: Continued Medical Work up  Expected Discharge Plan and Services Expected Discharge Plan: Skilled Nursing Facility

## 2020-01-09 NOTE — Progress Notes (Signed)
Physical Therapy Treatment Patient Details Name: Jeffrey [name] MRN: [id number] DOB: 11-03-1961 Today's Date: 01/09/2020    History of Present Illness Jeffrey [name] is a 59 y.o. male with medical history significant for COPD with chronic respiratory failure, bipolar disorder, diastolic CHF, hepatic encephalopathy, depression. History is obtained from chart review and talking to patient's ex-wife.  Per chart review patient was brought into the ED reports of difficulty breathing over the past 3 days, with confusion, and in the ED patient was tremulous. Patient's ex-wife who now lives with patient tells me patient has not had any drink or use any drugs in the past year.  She reports increasing difficulty breathing and productive cough.  No vomiting no loose stools.  She said the past 3 days when patient was confused tells when he said he wanted someone to shoot him.  She otherwise denies suicidal ideations.  She also reports patient has gotten tired of using oxygen, his bronchodilators, and lying around as normally he likes to be active.    PT Comments    Patient requires frequent redirection throughout session as well as verbal and tactile cueing. Patient able to complete bed mobility and transfers with min assist and he requires assist for balance and use of RW while standing. He begins to lose balance posteriorly when standing if not held up. He requires frequent manual and verbal cueing for completing exercises at EOB. He ambulates with small, shuffled steps to chair and is limited by fatigue. Patient left in chair and RN made aware. Discussed increasing patient mobility throughout the day with RN. Patient will benefit from continued physical therapy in hospital and recommended venue below to increase strength, balance, endurance for safe ADLs and gait.   Follow Up Recommendations  SNF     Equipment Recommendations  None recommended by PT    Recommendations for Other Services        Precautions / Restrictions Precautions Precautions: Fall Restrictions Weight Bearing Restrictions: No    Mobility  Bed Mobility Overal bed mobility: Needs Assistance Bed Mobility: Supine to Sit     Supine to sit: Min assist     General bed mobility comments: labored movement  Transfers Overall transfer level: Needs assistance Equipment used: Rolling walker (2 wheeled) Transfers: Sit to/from [employer] Sit to Stand: Min assist Stand pivot transfers: Min assist       General transfer comment: very unsteady on feet, labored movement, increased time to transtion to standing, verbal and manual cueing for use of RW for support, frequent verbal cueing for sequencing  Ambulation/Gait Ambulation/Gait assistance: Min assist;Mod assist Gait Distance (Feet): 5 Feet Assistive device: Rolling walker (2 wheeled) Gait Pattern/deviations: Decreased step length - right;Decreased step length - left;Decreased stride length;Shuffle Gait velocity: decreased   General Gait Details: patient is limited to several small,shuffled, and labored steps at bedside due to weakness, poor standing balance and he requires assist to remain standing   Stairs             Wheelchair Mobility    Modified Rankin (Stroke Patients Only)       Balance Overall balance assessment: Needs assistance Sitting-balance support: Feet supported;No upper extremity supported Sitting balance-Leahy Scale: Fair Sitting balance - Comments: seated EOB   Standing balance support: During functional activity;Bilateral upper extremity supported Standing balance-Leahy Scale: Poor Standing balance comment: fair/poor using RW  Cognition Arousal/Alertness: Awake/alert Behavior During Therapy: Restless;Flat affect;Impulsive Overall Cognitive Status: No family/caregiver present to determine baseline cognitive functioning                                  General Comments: Patient mostly non-verbal      Exercises General Exercises - Lower Extremity Ankle Circles/Pumps: AROM;Both;15 reps;Seated Long Arc Quad: Seated;AROM;Strengthening;Both;15 reps Hip Flexion/Marching: Seated;AROM;Strengthening;Both;15 reps;Standing    General Comments        Pertinent Vitals/Pain Pain Assessment: No/denies pain    Home Living                      Prior Function            PT Goals (current goals can now be found in the care plan section) Acute Rehab PT Goals Patient Stated Goal: return home PT Goal Formulation: With patient Time For Goal Achievement: 01/19/20 Potential to Achieve Goals: Fair Progress towards PT goals: Progressing toward goals    Frequency    Min 3X/week      PT Plan Current plan remains appropriate    Co-evaluation              AM-PAC PT "6 Clicks" Mobility   Outcome Measure  Help needed turning from your back to your side while in a flat bed without using bedrails?: None Help needed moving from lying on your back to sitting on the side of a flat bed without using bedrails?: A Little Help needed moving to and from a bed to a chair (including a wheelchair)?: A Little Help needed standing up from a chair using your arms (e.g., wheelchair or bedside chair)?: A Little Help needed to walk in hospital room?: A Lot Help needed climbing 3-5 steps with a railing? : A Lot 6 Click Score: 17    End of Session Equipment Utilized During Treatment: Oxygen;Gait belt Activity Tolerance: Patient tolerated treatment well;Patient limited by fatigue Patient left: in chair;with call bell/phone within reach;with chair alarm set Nurse Communication: Mobility status PT Visit Diagnosis: Unsteadiness on feet (R26.81);Other abnormalities of gait and mobility (R26.89);Muscle weakness (generalized) (M62.81)     Time: 3235-5732 PT Time Calculation (min) (ACUTE ONLY): 26 min  Charges:  $Therapeutic Exercise: 8-22  mins $Therapeutic Activity: 8-22 mins                    3:14 PM, 01/09/20 Mearl Latin PT, DPT Physical Therapist at [facility]

## 2020-01-10 LAB — BASIC METABOLIC PANEL
Anion gap: 10 (ref 5–15)
BUN: 11 mg/dL (ref 6–20)
CO2: 29 mmol/L (ref 22–32)
Calcium: 8.9 mg/dL (ref 8.9–10.3)
Chloride: 102 mmol/L (ref 98–111)
Creatinine, Ser: 0.59 mg/dL — ABNORMAL LOW (ref 0.61–1.24)
GFR calc Af Amer: >60 mL/min (ref >60)
GFR calc non Af Amer: >60 mL/min (ref >60)
Glucose, Bld: 124 mg/dL — ABNORMAL HIGH (ref 70–99)
Potassium: 3.6 mmol/L (ref 3.5–5.1)
Sodium: 141 mmol/L (ref 135–145)

## 2020-01-10 MED ORDER — METOPROLOL TARTRATE 25 MG PO TABS: 12.5000 mg | ORAL_TABLET | Freq: Two times a day (BID) | ORAL | 2 refills | Status: DC

## 2020-01-10 MED ORDER — OLANZAPINE 10 MG PO TBDP: 10.0000 mg | ORAL_TABLET | Freq: Two times a day (BID) | ORAL | 1 refills | Status: DC | PRN

## 2020-01-10 MED ORDER — QUETIAPINE FUMARATE 50 MG PO TABS: 50.0000 mg | ORAL_TABLET | Freq: Every day | ORAL | 0 refills | Status: DC

## 2020-01-10 MED ORDER — GUAIFENESIN ER 600 MG PO TB12: 600.0000 mg | ORAL_TABLET | Freq: Two times a day (BID) | ORAL | 0 refills | Status: AC | PRN

## 2020-01-10 MED ORDER — ENSURE ENLIVE PO LIQD: 237.0000 mL | Freq: Three times a day (TID) | ORAL | 4 refills | Status: DC

## 2020-01-10 MED ORDER — QUETIAPINE FUMARATE 25 MG PO TABS: 25.0000 mg | ORAL_TABLET | Freq: Every morning | ORAL | 0 refills | Status: DC

## 2020-01-10 MED ORDER — GABAPENTIN 300 MG PO CAPS: 600.0000 mg | ORAL_CAPSULE | Freq: Two times a day (BID) | ORAL | 0 refills | Status: DC

## 2020-01-10 MED ORDER — IPRATROPIUM-ALBUTEROL 0.5-2.5 (3) MG/3ML IN SOLN: 3.0000 mL | Freq: Two times a day (BID) | RESPIRATORY_TRACT | 0 refills | Status: DC

## 2020-01-10 MED ORDER — ACETAMINOPHEN 325 MG PO TABS: 650.0000 mg | ORAL_TABLET | Freq: Four times a day (QID) | ORAL | 0 refills | Status: AC | PRN

## 2020-01-10 MED ORDER — LORAZEPAM 1 MG PO TABS: 1.0000 mg | ORAL_TABLET | Freq: Two times a day (BID) | ORAL | 0 refills | Status: DC

## 2020-01-10 MED ORDER — ASPIRIN EC 81 MG PO TBEC: 81.0000 mg | DELAYED_RELEASE_TABLET | Freq: Every day | ORAL | 4 refills | Status: AC

## 2020-01-10 MED ORDER — THIAMINE HCL 100 MG PO TABS: 100.0000 mg | ORAL_TABLET | Freq: Every day | ORAL | 1 refills | Status: DC

## 2020-01-10 NOTE — Progress Notes (Signed)
  Speech Language Pathology Treatment: Dysphagia  Patient Details Name: Jeffrey [name] MRN: [id number] DOB: 1961/08/09 Today's Date: 01/10/2020 Time: 9357-0177 SLP Time Calculation (min) (ACUTE ONLY): 10 min  Assessment / Plan / Recommendation Clinical Impression  Pt vocalizing appropriately this AM with mild hoarse vocal quality and reduced vocal intensity. He was able to self present thin liquids via straw sips independently and without signs of compromised airway or residuals. Continue diet as ordered and SLP to sign off. Pt will likely discharge from hospital today.    HPI HPI: This is a 59 year old man with severe COPD who is usually on 3 to 4 L/min of nasal cannula at home and who has history of diastolic congestive heart failure was admitted 12/24/19 with dyspnea and confusion.  He was intubated and mechanically ventilated fairly quickly in the emergency room. He was extubated 12/26/19. Acute on chronic respiratory failure with hypoxia and hypercarbia .  This is from his severe COPD.  Now extubated.  Continue with steroids, antibiotics and bronchodilators.  Pulmonary following. Pt was seen for MBSS 02/27/20 during acute stay at [facility] and a mechanical soft diet with thin liquids was recommended (Pt refused puree/NTL prior to MBSS). BSE ordered      SLP Plan  Discharge SLP treatment due to (comment);All goals met       Recommendations  Diet recommendations: Dysphagia 3 (mechanical soft);Thin liquid Liquids provided via: Cup;Straw Medication Administration: Whole meds with puree Supervision: Patient able to self feed;Staff to assist with self feeding;Full supervision/cueing for compensatory strategies Compensations: Slow rate;Small sips/bites Postural Changes and/or Swallow Maneuvers: Seated upright 90 degrees;Upright 30-60 min after meal                Oral Care Recommendations: Oral care BID;Staff/trained caregiver to provide oral care Follow up Recommendations: 24 hour  supervision/assistance SLP Visit Diagnosis: Dysphagia, unspecified (R13.10) Plan: Discharge SLP treatment due to (comment);All goals met       Thank you,  Genene Churn, [place]                 [place] 01/10/2020, 10:16 AM

## 2020-01-10 NOTE — Progress Notes (Signed)
Report called to [facility] [phone]. Spoke to [place] RN at [place] and gave report. Transport called.

## 2020-01-10 NOTE — Progress Notes (Signed)
Assisted tele visit to patient with wife and family member.  Vena Austria, RN

## 2020-01-10 NOTE — Consult Note (Signed)
Spoke with Dr Marisa Severin, patient scheduled for discharge today. Psychiatry consult discontinued.

## 2020-01-10 NOTE — Discharge Instructions (Signed)
From time to time patient may need further adjustments of antipsychotic/sedatives to address restlessness and behavioral concerns ----From time to time patient may need further adjustments of antipsychotic/sedatives to address restlessness and behavioral concerns -Dysphagia 3 (mechanical soft); liquids have been changed to thin liquids as of 01/05/2020 from nectar-thick liquid

## 2020-01-10 NOTE — Discharge Summary (Signed)
Jeffrey [name], is a 59 y.o. male  DOB 11-02-1961  MRN [id number].  Admission date:  12/24/2019  Admitting Physician  Onnie Boer, MD  Discharge Date:  01/10/2020   Primary MD  Gareth Morgan, MD  Recommendations for primary care physician for things to follow:    From time to time patient may need further adjustments of antipsychotic/sedatives to address restlessness and behavioral concerns  Admission Diagnosis  Encephalopathy [G93.40] History of ETT [Z92.89] Acute on chronic respiratory failure with hypercapnia (HCC) [J96.22] Acute respiratory failure with hypoxia and hypercapnia (HCC) [J96.01, J96.02]  Discharge Diagnosis  Encephalopathy [G93.40] History of ETT [Z92.89] Acute on chronic respiratory failure with hypercapnia (HCC) [J96.22] Acute respiratory failure with hypoxia and hypercapnia (HCC) [J96.01, J96.02]   Active Problems:   Bipolar disorder with severe depression (HCC)   Essential hypertension   COPD with acute exacerbation (HCC)   Acute on chronic respiratory failure with hypercapnia (HCC)   Acute on chronic respiratory failure with hypoxia and hypercapnia (HCC)   Lobar pneumonia (HCC)   Acute metabolic encephalopathy   Acute on chronic diastolic CHF (congestive heart failure) (HCC)   Aspiration pneumonitis (HCC)      Past Medical History:  Diagnosis Date  . Acute blood loss anemia 02/07/2017  . Anxiety   . Arthritis    deg disease, bulging disk,  shoulder level  . Bipolar disorder (HCC)   . Bipolar disorder (HCC)   . CHF (congestive heart failure) (HCC)   . 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 Depression    anxiety  . Hyperlipidemia   . Hypertension   . Peptic ulcer disease    Review  . Pneumonia   . [name] (peripheral vascular disease) (HCC) 06/18/2015  . Shortness of breath     Past Surgical History:  Procedure Laterality Date  . BIOPSY  02/09/2017   Procedure:  BIOPSY;  Surgeon: [name], MD;  Location: [facility];  Service: Endoscopy;;  duodenal gastric  . COLONOSCOPY  03/14/2007   DUK:GURKYH colonoscopy and terminal ileoscopy except external hemorrhoids  . COLONOSCOPY N/A 05/05/2013   [name]: external/internal anal canal hemorrhoids, unable to intubate TI, segemental biopsies unremarkable   . COLONOSCOPY N/A 04/11/2017   Procedure: COLONOSCOPY;  Surgeon: [name], MD;  Location: [facility];  Service: Endoscopy;  Laterality: N/A;  . COLONOSCOPY WITH PROPOFOL N/A 03/31/2017   Procedure: COLONOSCOPY WITH PROPOFOL;  Surgeon: [name], MD;  Location: [facility];  Service: Endoscopy;  Laterality: N/A;  1:45pm  . ESOPHAGOGASTRODUODENOSCOPY  03/14/2007   CWC:[id number] antral gastritis with bulbar duodenitis/paucity to postbulbar duodenal folds and biopsy were benign with no evidence of villous atrophy.  . ESOPHAGOGASTRODUODENOSCOPY (EGD) WITH PROPOFOL N/A 02/09/2017   Procedure: ESOPHAGOGASTRODUODENOSCOPY (EGD) WITH PROPOFOL;  Surgeon: [name], MD;  [facility]: [facility];  Service: Endoscopy;  Laterality: N/A;  . GIVENS CAPSULE STUDY N/A 04/08/2017   Procedure: GIVENS CAPSULE STUDY;  Surgeon: [name], MD;  Location: [facility];  Service: Endoscopy;  Laterality: N/A;  . HAND SURGERY     left, secondary to self-inflicted laceration  . HEMORRHOID SURGERY N/A 04/14/2017   Procedure: HEMORRHOIDECTOMY;  Surgeon: Franky MachoJenkins, Mark, MD;  Location: [facility];  Service: General;  Laterality: N/A;  . SHOULDER SURGERY     right  . TOE SURGERY     left great toe , amputated-lawnmover accident     HPI  from the history and physical done on the day of admission:    Chief Complaint: AMS, difficulty breathing  HPI: Jeffrey [name] is a 59 y.o. male with medical history significant for COPD with chronic respiratory failure, bipolar disorder, diastolic CHF, hepatic encephalopathy, depression. History is obtained from chart  review and talking to patient's ex-wife.  Per chart review patient was brought into the ED reports of difficulty breathing over the past 3 days, with confusion, and in the ED patient was tremulous.   Patient's ex-wife who now lives with patient tells me patient has not had any drink or use any drugs in the past year.  She reports increasing difficulty breathing and productive cough.  No vomiting no loose stools.  She said the past 3 days when patient was confused tells when he said he wanted someone to shoot him.  She otherwise denies suicidal ideations.  She also reports patient has gotten tired of using oxygen, his bronchodilators, and lying around as normally he likes to be active.  Admission 11/19- 11/23 for acute hypercapnic respiratory failure secondary to COPD exacerbation and component of CHF.  Required Precedex drip for agitation.  Discharged home on 3 to 4 L nasal cannula which was his baseline.  ED Course: Tachypneic, O2 sats 91 to 95% on nasal cannula 4 L.  But with worsening mental status, patient not responding, and then agitated, pulling at lines and tubes, decision was made to intubate patient as patient would not tolerate BiPAP.  And sedated with propofol.  ABG showed pH of 7.3, marked elevated PCO2 of 107.  PaO2 < 31.  Covid test negative.  Portable chest x-ray shows left greater than right base airspace disease, aspiration or infection favored. IV vancomycin and cefepime started for HCAP.     Hospital Course:    Brief Narrative:  59 year old male with history of COPD is chronically on 3 to 4 L of oxygen, admitted with worsening shortness of breath.  Felt to be related to COPD exacerbation.  He was intubated in the emergency room and admitted to the ICU.  Fortunately, he was able to extubate on 12/26/19.  Hospital course further complicated by development of acute metabolic encephalopathy.  Etiology is unclear, but may be related to high-dose steroids.  Required Precedex infusion.   Steroids have been tapered off ---Mental status overall much improved,  -Patient is cooperative, talkative and mostly appropriate -Was able to wean off Precedex drip on 01/04/2020-  -Patient is medically stable for discharge - Transfer to SNF rehab   Assessment & Plan:   Active Problems:   Bipolar disorder with severe depression (HCC)   Essential hypertension   COPD with acute exacerbation (HCC)   Acute on chronic respiratory failure with hypercapnia (HCC)   Acute on chronic respiratory failure with hypoxia and hypercapnia (HCC)   Lobar pneumonia (HCC)   Acute metabolic encephalopathy   Acute on chronic diastolic CHF (congestive heart failure) (HCC)   Aspiration pneumonitis (HCC)   1)Acute on chronic respiratory failure with hypoxia-ABG suggested mild hypercapnia as well----- secondary to COPD exacerbation and aspiration pneumonia.  Patient was intubated on admission (12/24/19), but was able to extubate 12/26/19.  Overall respiratory status appears to be stable.  Currently, he is requiring 2 L, which is back to his baseline PTA  2) acute metabolic encephalopathy with agitation----patient with underlying history of bipolar disorder, was on high-dose steroids for COPD exacerbation respiratory failure, - -[facility] and PCCM consult appreciated for management of presumed steroid-induced psychosis, treated with Precedex drip and as needed Geodon -----Mental status overall much improved. -No longer requiring IV medications for sedation -Patient is cooperative, talkative and mostly appropriate -Was able to wean off Precedex drip on 01/04/2020-  -Patient with history of EtOH abuse, denies recent EtOH use -As per daughter Ms. 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 twice daily -May  use as needed ODT Zyprexa - -Occasional episodes of restlessness, patient is usually redirectable -- may need further adjustments of antipsychotic/psychiatric medications to optimize mood and behavior  3)Acute COPD exacerbation secondary to presumed aspiration pneumonia --- extubated 12/26/2019 , c =-ABG was suggestive of hypoxia and hypercapnia, completed antibiotics already, prednisone discontinued 01/04/2020  4)Bipolar Disorder-  Continue home psychotropic medications including olanzapine and Depakote, also continue Prozac, lorazepam and trazodone,--- additional psych medication adjustments as above ----Mental status overall much improved,   -Not requiring IV medications for sedation -Patient is cooperative, talkative and mostly appropriate -- - -Occasional episodes of restlessness, patient is usually redirectable -- may need further adjustments of antipsychotic/psychiatric medications to optimize mood and behavior  5)Hypertension---.  c/n metoprolol to 12.5mg   twice daily,   6)Dysphonia/Dysphagia--- voice is a bit louder, patient voice is  soft/muffled apparently this is not new (initially worsened post intubation) -Speech pathology eval appreciated recommends-Dysphagia 3 (mechanical soft); liquids have been changed to thin liquids as of 01/05/2020 from nectar-thick liquid  7)[name]--stable, continue atorvastatin and aspirin  8)Social/Ethics-- Discussed with Daughter [name] [phone] [facility]), Pt is a Full code  9)Tobacco Abuse----smoking cessation  DVT prophylaxis: Lovenox Code Status: Full code  Family Communication: Discussed with daughter over the phone who is the POA---- [phone]  Disposition Plan: ---  transfer to SNF rehab   Consultants:   Pulmonology  Psychiatry  Procedures:   Intubation 12/24/19  Extubation- 12/26/19  Antimicrobials:   Zosyn 1/31> 12/31/19  Other--off Precedex drip on 01/04/2020  Discharge Condition: stable  Follow  UP  Contact information for after-discharge care    [facility] .   Service: Skilled Nursing Contact information: [address] [place] [phone]               Diet and Activity recommendation:  As advised  Discharge Instructions    Discharge Instructions    Call MD for:  difficulty breathing, headache or visual disturbances   Complete by: As directed    Call MD for:  persistant dizziness or light-headedness   Complete by: As directed    Call MD for:  persistant nausea and vomiting   Complete by: As directed    Call MD for:  severe uncontrolled pain   Complete by: As directed    Call MD for:  temperature >100.4   Complete by: As directed    Diet general   Complete by: As directed    Discharge instructions   Complete by: As directed    ---  From time to time patient may need further adjustments of antipsychotic/sedatives to address restlessness and behavioral concerns -Dysphagia 3 (mechanical soft); liquids have been changed to thin liquids as of 01/05/2020 from nectar-thick liquid   Increase activity slowly   Complete by: As directed         Discharge Medications     Allergies as of 01/10/2020      Reactions   Augmentin [amoxicillin-pot Clavulanate] Rash, Other (See Comments)   Tolerated Zosyn 12/27/2019 Has patient had a PCN reaction causing immediate rash, facial/tongue/throat swelling, SOB or lightheadedness with hypotension: No Has patient had a PCN reaction causing severe rash involving mucus membranes or skin necrosis: No Has patient had a PCN reaction that required hospitalization No Has patient had a PCN reaction occurring within the last 10 years: Yes If all of the above answers are "NO", then may proceed with Cephalosporin use.   Ace Inhibitors Hives      Medication List    STOP taking these medications   BOOST NUTRITIONAL ENERGY PO Replaced by: feeding supplement (ENSURE ENLIVE)  Liqd   doxycycline 100 MG tablet Commonly known as: VIBRA-TABS   furosemide 20 MG tablet Commonly known as: LASIX   ipratropium 0.02 % nebulizer solution Commonly known as: ATROVENT   OLANZapine 10 MG tablet Commonly known as: ZYPREXA Replaced by: OLANZapine zydis 10 MG disintegrating tablet   potassium chloride SA 20 MEQ tablet Commonly known as: KLOR-CON   predniSONE 20 MG tablet Commonly known as: DELTASONE   traZODone 50 MG tablet Commonly known as: DESYREL     TAKE these medications   acetaminophen 325 MG tablet Commonly known as: TYLENOL Take 2 tablets (650 mg total) by mouth every 6 (six) hours as needed for mild pain, fever or headache (or Fever >/= 101).   albuterol 108 (90 Base) MCG/ACT inhaler Commonly known as: VENTOLIN HFA Inhale 2 puffs into the lungs every 4 (four) hours as needed for wheezing or shortness of breath. What changed: Another medication with the same name was removed. Continue taking this medication, and follow the directions you see here.   aspirin EC 81 MG tablet Take 1 tablet (81 mg total) by mouth daily with breakfast. What changed: when to take this   atorvastatin 20 MG tablet Commonly known as: LIPITOR Take 1 tablet (20 mg total) by mouth daily. For high cholesterol What changed: when to take this   divalproex 500 MG 24 hr tablet Commonly known as: DEPAKOTE ER Take 3 tablets (1,500 mg total) by mouth at bedtime.   feeding supplement (ENSURE ENLIVE) Liqd Take 237 mLs by mouth 3 (three) times daily between meals. Replaces: BOOST NUTRITIONAL ENERGY PO   FLUoxetine 40 MG capsule Commonly known as: PROZAC Take 1 capsule (40 mg total) by mouth daily. For mood control What changed: when to take this   folic acid 1 MG tablet Commonly known as: FOLVITE Take 1 mg by mouth every morning.   gabapentin 300 MG capsule Commonly known as: NEURONTIN Take 2 capsules (600 mg total) by mouth 2 (two) times daily. What changed:    medication strength  how much to take  when to take this   guaiFENesin 600 MG 12 hr tablet Commonly known as: MUCINEX Take 1 tablet (600 mg total) by mouth 2 (two) times daily as needed for cough or to loosen phlegm.   ipratropium-albuterol 0.5-2.5 (3) MG/3ML Soln Commonly known as: DUONEB Take 3 mLs by nebulization 2 (two) times daily.   LORazepam 1 MG  tablet Commonly known as: ATIVAN Take 1 tablet (1 mg total) by mouth 2 (two) times daily.   metoprolol tartrate 25 MG tablet Commonly known as: LOPRESSOR Take 0.5 tablets (12.5 mg total) by mouth 2 (two) times daily. What changed: how much to take   multivitamin with minerals Tabs tablet Take 1 tablet by mouth daily. What changed: when to take this   OLANZapine zydis 10 MG disintegrating tablet Commonly known as: ZYPREXA Take 1 tablet (10 mg total) by mouth every 12 (twelve) hours as needed (Agitation). Replaces: OLANZapine 10 MG tablet   pantoprazole 20 MG tablet Commonly known as: PROTONIX Take 20 mg by mouth every morning.   QUEtiapine 50 MG tablet Commonly known as: SEROQUEL Take 1 tablet (50 mg total) by mouth at bedtime.   QUEtiapine 25 MG tablet Commonly known as: SEROQUEL Take 1 tablet (25 mg total) by mouth every morning. Start taking on: January 11, 2020   thiamine 100 MG tablet Take 1 tablet (100 mg total) by mouth daily. Start taking on: January 11, 2020   vitamin B-12 500 MCG tablet Commonly known as: CYANOCOBALAMIN Take 1,000 mcg by mouth every morning.       Major procedures and Radiology Reports - PLEASE review detailed and final reports for all details, in brief -   CT Head Wo Contrast  Result Date: 12/24/2019 CLINICAL DATA:  Altered mental status EXAM: CT HEAD WITHOUT CONTRAST TECHNIQUE: Contiguous axial images were obtained from the base of the skull through the vertex without intravenous contrast. COMPARISON:  11/21/2019 FINDINGS: Brain: No evidence of acute infarction, hemorrhage,  hydrocephalus, extra-axial collection or mass lesion/mass effect. Vascular: No hyperdense vessel or unexpected calcification. Skull: Normal. Negative for fracture or focal lesion. Sinuses/Orbits: No acute finding. Other: None. IMPRESSION: Normal head CT for age. Electronically Signed   By: Alcide Clever M.D.   On: 12/24/2019 20:57   DG Chest 1V REPEAT Same Day  Result Date: 12/24/2019 CLINICAL DATA:  Post intubation. EXAM: CHEST - 1 VIEW SAME DAY COMPARISON:  Multiple chest x-rays since February 26, 2019 FINDINGS: The distal tip of the ET tube is difficult to see with confidence but appears to terminate 8.3 cm above the carina and 5 cm below the thoracic inlet, in adequate position. The NG tube terminates below today's film. No pneumothorax. Coarsened lung markings are seen bilaterally. The focal opacity in the right base seen earlier today is no longer visualized. No focal infiltrate seen in the left base either. There is thickening of the left pleural stripe near the base which is new since April of 2020. The cardiomediastinal silhouette is stable. IMPRESSION: 1. No focal infiltrates. 2. Thickening of the left inferior pleural stripe is consistent with pleural fluid, suggestive of loculation. 3. Coarsened lung markings are similar since December 04, 2019 and consistent with the patient's underlying emphysema. A superimposed atypical infection or pulmonary venous congestion are not excluded. Electronically Signed   By: Gerome Sam III M.D   On: 12/24/2019 17:39   DG CHEST PORT 1 VIEW  Result Date: 12/31/2019 CLINICAL DATA:  Respiratory failure. EXAM: PORTABLE CHEST 1 VIEW COMPARISON:  December 28, 2019. FINDINGS: The heart size and mediastinal contours are within normal limits. No pneumothorax is noted. Increased bibasilar opacities are noted concerning for pneumonia or edema. Small bilateral pleural effusions are noted. The visualized skeletal structures are unremarkable. IMPRESSION: Increased bibasilar  opacities are noted concerning for pneumonia or edema. Small bilateral pleural effusions are noted. Electronically Signed   By: Fayrene Fearing  Christen Butter M.D.   On: 12/31/2019 09:50   DG CHEST PORT 1 VIEW  Result Date: 12/28/2019 CLINICAL DATA:  Hypoxia EXAM: PORTABLE CHEST 1 VIEW COMPARISON:  Chest radiograph 12/26/2019 FINDINGS: The lungs are hyperinflated with diffuse interstitial prominence. No focal airspace consolidation or pulmonary edema. No pleural effusion or pneumothorax. Normal cardiomediastinal contours. IMPRESSION: Findings of COPD without acute airspace disease. Electronically Signed   By: Deatra Robinson M.D.   On: 12/28/2019 19:20   DG Chest Port 1 View  Result Date: 12/26/2019 CLINICAL DATA:  Respiratory failure. EXAM: PORTABLE CHEST 1 VIEW COMPARISON:  12/24/2019 FINDINGS: The endotracheal tube is 4 cm above the carina. The NG tube is coursing down the esophagus and into the stomach. Underlying chronic emphysematous changes and pulmonary scarring. Small bilateral pleural effusions and overlying atelectasis along with probable some loculated fluid in the major fissure on the right. No definite infiltrates. IMPRESSION: 1. Endotracheal tube and NG tubes in good position. 2. Chronic underlying lung disease/emphysema. 3. Small bilateral pleural effusions and overlying atelectasis. Electronically Signed   By: Rudie Meyer M.D.   On: 12/26/2019 05:17   DG CHEST PORT 1 VIEW  Result Date: 12/24/2019 CLINICAL DATA:  Check gastric catheter placement EXAM: PORTABLE CHEST 1 VIEW COMPARISON:  12/24/2019 FINDINGS: Cardiac shadow is within normal limits. Endotracheal tube and gastric catheter are again noted and in satisfactory position. The lungs are again well aerated. Small loculated pleural effusion on the left inferiorly is noted. Patchy interstitial changes are noted similar to that seen on the prior exam. No bony abnormality is seen. IMPRESSION: Endotracheal tube and gastric catheter in satisfactory  position. Stable interstitial markings and left-sided pleural effusion. Electronically Signed   By: Alcide Clever M.D.   On: 12/24/2019 21:55   DG Chest Portable 1 View  Result Date: 12/24/2019 CLINICAL DATA:  Short of breath for 3 days. COVID-19 negative. CHF. COPD. Recurrent pneumonia. EXAM: PORTABLE CHEST 1 VIEW COMPARISON:  12/07/2019.  Chest CT of 03/26/2019 FINDINGS: Midline trachea. Normal heart size. No pneumothorax. Small left pleural effusion with possible mild loculation laterally. Moderate diffuse interstitial prominence. Left worse than right base airspace disease. IMPRESSION: Development of left greater than right base airspace disease, superimposed upon chronic moderate interstitial thickening. Favor infection or aspiration. Small left pleural effusion, with possible loculation laterally. Electronically Signed   By: Jeronimo Greaves M.D.   On: 12/24/2019 16:20   Micro Results   Recent Results (from the past 240 hour(s))  Respiratory Panel by RT PCR (Flu A&B, Covid) - Nasopharyngeal Swab     Status: None   Collection Time: 01/09/20  3:24 PM   Specimen: Nasopharyngeal Swab  Result Value Ref Range Status   SARS Coronavirus 2 by RT PCR NEGATIVE NEGATIVE Final    Comment: (NOTE) SARS-CoV-2 target nucleic acids are NOT DETECTED. The SARS-CoV-2 RNA is generally detectable in upper respiratoy specimens during the acute phase of infection. The lowest concentration of SARS-CoV-2 viral copies this assay can detect is 131 copies/mL. A negative result does not preclude SARS-Cov-2 infection and should not be used as the sole basis for treatment or other patient management decisions. A negative result may occur with  improper specimen collection/handling, submission of specimen other than nasopharyngeal swab, presence of viral mutation(s) within the areas targeted by this assay, and inadequate number of viral copies (<131 copies/mL). A negative result must be combined with clinical observations,  patient history, and epidemiological information. The expected result is Negative. Fact Sheet for Patients:  https://www.moore.com/  Fact Sheet for Healthcare Providers:  https://www.young.biz/ This test is not yet ap proved or cleared by the [place] FDA and  has been authorized for detection and/or diagnosis of SARS-CoV-2 by FDA under an Emergency Use Authorization (EUA). This EUA will remain  in effect (meaning this test can be used) for the duration of the COVID-19 declaration under Section 564(b)(1) of the Act, 21 U.S.C. section 360bbb-3(b)(1), unless the authorization is terminated or revoked sooner.    Influenza A by PCR NEGATIVE NEGATIVE Final   Influenza B by PCR NEGATIVE NEGATIVE Final    Comment: (NOTE) The Xpert Xpress SARS-CoV-2/FLU/RSV assay is intended as an aid in  the diagnosis of influenza from Nasopharyngeal swab specimens and  should not be used as a sole basis for treatment. Nasal washings and  aspirates are unacceptable for Xpert Xpress SARS-CoV-2/FLU/RSV  testing. Fact Sheet for Patients: https://www.moore.com/ Fact Sheet for Healthcare Providers: https://www.young.biz/ This test is not yet approved or cleared by the [place] FDA and  has been authorized for detection and/or diagnosis of SARS-CoV-2 by  FDA under an Emergency Use Authorization (EUA). This EUA will remain  in effect (meaning this test can be used) for the duration of the  Covid-19 declaration under Section 564(b)(1) of the Act, 21  U.S.C. section 360bbb-3(b)(1), unless the authorization is  terminated or revoked. Performed at [facility], [address] [place], [place] [address]     Today   Subjective    Jeffrey [name] today has no new concerns -Remains cooperative, oral intake is fair        Patient has been seen and examined prior to discharge   Objective   Blood pressure 136/66, pulse 94,  temperature 98.5 F (36.9 C), temperature source Oral, resp. rate (!) 22, height 5\' 10"  (1.778 m), weight 65.2 kg, SpO2 100 %.   Intake/Output Summary (Last 24 hours) at 01/10/2020 1145 Last data filed at 01/09/2020 1700 Gross per 24 hour  Intake 360 ml  Output 425 ml  Net -65 ml    Exam General exam: Alert, awake, no distress Nose- [place] 2L/min Respiratory system: Fair air movement, no wheezing  cardiovascular system:RRR. No murmurs, rubs, gallops. Gastrointestinal system: Abdomen is nondistended, soft and nontender.   Normal bowel sounds heard. Central nervous system: No focal neurological deficits, muffed/soft voice, moving all extremities spontaneously Extremities: No C/C/E, +pedal pulses Skin: No rashes, lesions or ulcers Psychiatry: ---Mental status overall much improved, -Patient is cooperative, talkative and mostly appropriate   Data Review   CBC w Diff:  Lab Results  Component Value Date   WBC 6.2 01/08/2020   HGB 10.7 (L) 01/08/2020   HCT 34.1 (L) 01/08/2020   HCT 37.7 12/16/2018   PLT 307 01/08/2020   LYMPHOPCT 3 12/24/2019   BANDSPCT 1 01/08/2019   MONOPCT 2 12/24/2019   EOSPCT 0 12/24/2019   BASOPCT 0 12/24/2019   CMP:  Lab Results  Component Value Date   NA 141 01/10/2020   K 3.6 01/10/2020   CL 102 01/10/2020   CO2 29 01/10/2020   BUN 11 01/10/2020   CREATININE 0.59 (L) 01/10/2020   CREATININE 1.12 05/02/2013   PROT 5.5 (L) 12/30/2019   ALBUMIN 2.7 (L) 12/30/2019   BILITOT 0.7 12/30/2019   ALKPHOS 52 12/30/2019   AST 20 12/30/2019   ALT 22 12/30/2019  .   Total Discharge time is about 33 minutes  02/27/2020 M.D on 01/10/2020 at 11:45 AM  Go to [email or web address] -  for contact  info  [facility] - Office  [phone]

## 2020-01-15 ENCOUNTER — Encounter (HOSPITAL_COMMUNITY): Payer: Self-pay | Admitting: Psychiatry

## 2020-01-15 ENCOUNTER — Ambulatory Visit (INDEPENDENT_AMBULATORY_CARE_PROVIDER_SITE_OTHER): Payer: Medicare Other | Admitting: Psychiatry

## 2020-01-15 ENCOUNTER — Other Ambulatory Visit: Payer: Self-pay

## 2020-01-15 DIAGNOSIS — F3162 Bipolar disorder, current episode mixed, moderate: Secondary | ICD-10-CM | POA: Diagnosis not present

## 2020-01-15 DIAGNOSIS — F332 Major depressive disorder, recurrent severe without psychotic features: Secondary | ICD-10-CM

## 2020-01-15 MED ORDER — OLANZAPINE 10 MG PO TBDP: 10.0000 mg | ORAL_TABLET | Freq: Two times a day (BID) | ORAL | 2 refills | Status: DC

## 2020-01-15 NOTE — Progress Notes (Signed)
Virtual Visit via Telephone Note  I connected with Jeffrey [name] on 01/15/20 at  1:20 PM EST by telephone and verified that I am speaking with the correct person using two identifiers.   I discussed the limitations, risks, security and privacy concerns of performing an evaluation and management service by telephone and the availability of in person appointments. I also discussed with the patient that there may be a patient responsible charge related to this service. The patient expressed understanding and agreed to proceed    I discussed the assessment and treatment plan with the patient. The patient was provided an opportunity to ask questions and all were answered. The patient agreed with the plan and demonstrated an understanding of the instructions.   The patient was advised to call back or seek an in-person evaluation if the symptoms worsen or if the condition fails to improve as anticipated.  I provided 15 minutes of non-face-to-face time during this encounter.   Diannia Ruder, MD  [facility] MD/PA/NP OP Progress Note  01/15/2020 1:54 PM Jeffrey [name]  MRN:  [id number]  Chief Complaint:  Chief Complaint    Anxiety; Agitation; Manic Behavior; Follow-up     HPI: This patient is a 59 year old white male who had been most recently living with his ex-wife in [place].  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  The patient has been staying at the [facility] skilled nursing facility since 01/12/2020.  He was discharged from [facility] with an acute COPD exacerbation after being there for approximately 2 weeks.  When he initially presented he was very confused and had pretty much given up and stopped using all of his medications and oxygen supplementation etc.  While in the hospital it looks like he had episodes of confusion and was treated with Seroquel and as needed olanzapine Zydis as well as Geodon at times.  The hospital discharge note expressed that  he was back to baseline and only having mild exacerbations of confusion.  However the nursing staff and social worker at the nursing home told me today that he is extremely confused agitated wanting to throw an oxygen tank at people and basically dangerous to self or others.  His wife stated that he was allergic to the as needed Seroquel that had been ordered so this was discontinued in the nursing home.  He now only has as needed olanzapine.  He is not sleeping during the night.  He is not oriented per their observation.  I suggested that since he is having so much difficulty and has been violent towards others in the nursing home that it would be best if he went back to the emergency room to find his general psych disposition and they are going to work towards this.  In the interim we will change his olanzapine to 10 mg twice daily in a standing order and add Haldol 5 mg p.o./IM as needed twice daily for acute agitation. Visit Diagnosis:    ICD-10-CM   1. Moderate mixed bipolar I disorder (HCC)  F31.62   2. Major depressive disorder, recurrent, severe without psychotic features (HCC)  F33.2     Past Psychiatric History: Several hospitalizations for depression, the last 1 in March 2019 for suicide attempt  Past Medical History:  Past Medical History:  Diagnosis Date  . Acute blood loss anemia 02/07/2017  . Anxiety   . Arthritis    deg disease, bulging disk,  shoulder level  .  Bipolar disorder ([place])   . Bipolar disorder ([place])   . CHF (congestive heart failure) ([place])   . COPD, severe ([place]) 10/09/2016  . Depression    anxiety  . Hyperlipidemia   . Hypertension   . Peptic ulcer disease    Review  . Pneumonia   . [name] (peripheral vascular disease) ([place]) 06/18/2015  . Shortness of breath     Past Surgical History:  Procedure Laterality Date  . BIOPSY  02/09/2017   Procedure: BIOPSY;  Surgeon: Danie Binder, MD;  [facility];  Service: Endoscopy;;  duodenal gastric  .  COLONOSCOPY  03/14/2007   [id number] colonoscopy and terminal ileoscopy except external hemorrhoids  . COLONOSCOPY N/A 05/05/2013   Dr. Gala Romney: external/internal anal canal hemorrhoids, unable to intubate TI, segemental biopsies unremarkable   . COLONOSCOPY N/A 04/11/2017   Procedure: COLONOSCOPY;  Surgeon: Danie Binder, MD;  [facility];  Service: Endoscopy;  Laterality: N/A;  . COLONOSCOPY WITH PROPOFOL N/A 03/31/2017   Procedure: COLONOSCOPY WITH PROPOFOL;  Surgeon: Daneil Dolin, MD;  Location[facility];  Service: Endoscopy;  Laterality: N/A;  1:45pm  . ESOPHAGOGASTRODUODENOSCOPY  03/14/2007   [id number] antral gastritis with bulbar duodenitis/paucity to postbulbar duodenal folds and biopsy were benign with no evidence of villous atrophy.  . ESOPHAGOGASTRODUODENOSCOPY (EGD) WITH PROPOFOL N/A 02/09/2017   Procedure: ESOPHAGOGASTRODUODENOSCOPY (EGD) WITH PROPOFOL;  Surgeon: Danie Binder, MD;  [facility];  Service: Endoscopy;  Laterality: N/A;  . GIVENS CAPSULE STUDY N/A 04/08/2017   Procedure: GIVENS CAPSULE STUDY;  Surgeon: Daneil Dolin, MD;  [facility];  Service: Endoscopy;  Laterality: N/A;  . HAND SURGERY     left, secondary to self-inflicted laceration  . HEMORRHOID SURGERY N/A 04/14/2017   Procedure: HEMORRHOIDECTOMY;  Surgeon: Aviva Signs, MD;  Location: [facility];  Service: General;  Laterality: N/A;  . SHOULDER SURGERY     right  . TOE SURGERY     left great toe , amputated-lawnmover accident    Family Psychiatric History: see below  Family History:  Family History  Problem Relation Age of Onset  . Breast cancer Mother        deceased  . Heart disease Father   . Depression Daughter   . Anxiety disorder Daughter   . Anxiety disorder Son   . Depression Son   . Asthma Brother   . Heart attack Maternal Aunt   . Heart attack Maternal Uncle   . Heart attack Paternal Aunt   . Heart attack Paternal Uncle   . Heart attack  Maternal Grandmother   . Heart attack Maternal Grandfather   . Emphysema Maternal Grandfather   . Heart attack Paternal Grandmother   . Heart attack Paternal Grandfather   . Colon cancer Neg Hx   . Liver disease Neg Hx     Social History:  Social History   Socioeconomic History  . Marital status: Divorced    Spouse name: Not on file  . Number of children: 2  . Years of education: Not on file  . Highest education level: Not on file  Occupational History  . Occupation: Disabled  Tobacco Use  . Smoking status: Current Some Day Smoker    Packs/day: 1.00    Years: 40.00    Pack years: 40.00    Types: Cigarettes    Start date: 08/04/1971  . Smokeless tobacco: Never Used  . Tobacco comment: peak rate of 2.5ppd, 1/2ppd on 11/27/2016 --  6 cigarettes / day 01/25/18  Substance and Sexual Activity  . Alcohol use: No    Alcohol/week: 0.0 standard drinks  . Drug use: Not Currently    Types: Marijuana    Comment: most days   . Sexual activity: Never  Other Topics Concern  . Not on file  Social History Narrative   Originally from [place]. Previously has lived in [facility] [place] 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 Questionable asbestos exposure. Does have significant exposure to fumes. No mold exposure. No bird exposure. No pets currently.    Social Determinants of Health   Financial Resource Strain:   . Difficulty of Paying Living Expenses: Not on file  Food Insecurity:   . Worried About Programme researcher, broadcasting/film/video in the Last Year: Not on file  . Ran Out of Food in the Last Year: Not on file  Transportation Needs:   . Lack of Transportation (Medical): Not on file  . Lack of Transportation (Non-Medical): Not on file  Physical Activity:   . Days of Exercise per Week: Not on file  . Minutes of Exercise per Session: Not on file  Stress:   . Feeling of Stress : Not on file  Social Connections:   . Frequency of Communication with  Friends and Family: Not on file  . Frequency of Social Gatherings with Friends and Family: Not on file  . Attends Religious Services: Not on file  . Active Member of Clubs or Organizations: Not on file  . Attends Banker Meetings: Not on file  . Marital Status: Not on file    Allergies:  Allergies  Allergen Reactions  . Augmentin [Amoxicillin-Pot Clavulanate] Rash and Other (See Comments)    Tolerated Zosyn 12/27/2019 Has patient had a PCN reaction causing immediate rash, facial/tongue/throat swelling, SOB or lightheadedness with hypotension: No Has patient had a PCN reaction causing severe rash involving mucus membranes or skin necrosis: No Has patient had a PCN reaction that required hospitalization No Has patient had a PCN reaction occurring within the last 10 years: Yes If all of the above answers are "NO", then may proceed with Cephalosporin use.  . Ace Inhibitors Hives    Metabolic Disorder Labs: No results found for: HGBA1C, MPG No results found for: PROLACTIN Lab Results  Component Value Date   TRIG 84 12/24/2019   Lab Results  Component Value Date   TSH 1.552 05/01/2019   TSH 0.787 12/22/2018    Therapeutic Level Labs: No results found for: LITHIUM Lab Results  Component Value Date   VALPROATE 31 (L) 12/24/2019   VALPROATE 83 01/04/2019   No components found for:  CBMZ  Current Medications: Current Outpatient Medications  Medication Sig Dispense Refill  . acetaminophen (TYLENOL) 325 MG tablet Take 2 tablets (650 mg total) by mouth every 6 (six) hours as needed for mild pain, fever or headache (or Fever >/= 101). 12 tablet 0  . albuterol (VENTOLIN HFA) 108 (90 Base) MCG/ACT inhaler Inhale 2 puffs into the lungs every 4 (four) hours as needed for wheezing or shortness of breath. 6.7 g 3  . aspirin EC 81 MG tablet Take 1 tablet (81 mg total) by mouth daily with breakfast. 30 tablet 4  . atorvastatin (LIPITOR) 20 MG tablet Take 1 tablet (20 mg total)  by mouth daily. For high cholesterol (Patient taking differently: Take 20 mg by mouth every evening. For high cholesterol) 30 tablet 0  .  divalproex (DEPAKOTE ER) 500 MG 24 hr tablet Take 3 tablets (1,500 mg total) by mouth at bedtime. 90 tablet 2  . feeding supplement, ENSURE ENLIVE, (ENSURE ENLIVE) LIQD Take 237 mLs by mouth 3 (three) times daily between meals. 2370 mL 4  . FLUoxetine (PROZAC) 40 MG capsule Take 1 capsule (40 mg total) by mouth daily. For mood control (Patient taking differently: Take 40 mg by mouth every morning. For mood control) 30 capsule 2  . folic acid (FOLVITE) 1 MG tablet Take 1 mg by mouth every morning.     . gabapentin (NEURONTIN) 300 MG capsule Take 2 capsules (600 mg total) by mouth 2 (two) times daily. 120 capsule 0  . guaiFENesin (MUCINEX) 600 MG 12 hr tablet Take 1 tablet (600 mg total) by mouth 2 (two) times daily as needed for cough or to loosen phlegm. 20 tablet 0  . ipratropium-albuterol (DUONEB) 0.5-2.5 (3) MG/3ML SOLN Take 3 mLs by nebulization 2 (two) times daily. 360 mL 0  . LORazepam (ATIVAN) 1 MG tablet Take 1 tablet (1 mg total) by mouth 2 (two) times daily. 30 tablet 0  . metoprolol tartrate (LOPRESSOR) 25 MG tablet Take 0.5 tablets (12.5 mg total) by mouth 2 (two) times daily. 30 tablet 2  . Multiple Vitamin (MULTIVITAMIN WITH MINERALS) TABS tablet Take 1 tablet by mouth daily. (Patient taking differently: Take 1 tablet by mouth every morning. ) 30 tablet 0  . OLANZapine zydis (ZYPREXA) 10 MG disintegrating tablet Take 1 tablet (10 mg total) by mouth 2 (two) times daily. 60 tablet 2  . pantoprazole (PROTONIX) 20 MG tablet Take 20 mg by mouth every morning.     Marland Kitchen QUEtiapine (SEROQUEL) 25 MG tablet Take 1 tablet (25 mg total) by mouth every morning. 30 tablet 0  . QUEtiapine (SEROQUEL) 50 MG tablet Take 1 tablet (50 mg total) by mouth at bedtime. 30 tablet 0  . thiamine 100 MG tablet Take 1 tablet (100 mg total) by mouth daily. 30 tablet 1  . vitamin B-12  (CYANOCOBALAMIN) 500 MCG tablet Take 1,000 mcg by mouth every morning.      No current facility-administered medications for this visit.     Musculoskeletal: Strength & Muscle Tone: within normal limits Gait & Station: normal Patient leans: N/A  Psychiatric Specialty Exam: Review of Systems  Unable to perform ROS: Psychiatric disorder  Endocrine: Negative for polyuria.    There were no vitals taken for this visit.There is no height or weight on file to calculate BMI.  General Appearance: NA  Eye Contact:  NA  Speech:  NA  Volume:  Decreased  Mood:  Angry and Irritable  Affect:  Inappropriate, Labile and Full Range  Thought Process:  Disorganized and Descriptions of Associations: Loose  Orientation:  Other:  disoriented except to name  Thought Content: Illogical   Suicidal Thoughts:  No  Homicidal Thoughts:  No  Memory:  Immediate;   Poor Recent;   Poor Remote;   Poor  Judgement:  Impaired  Insight:  Lacking  Psychomotor Activity:  Restlessness  Concentration:  Concentration: Poor and Attention Span: Poor  Recall:  Poor  Fund of Knowledge: Poor  Language: Fair  Akathisia:  No  Handed:  Right  AIMS (if indicated): not done  Assets: Family support  ADL's:  Impaired  Cognition: Impaired,  Mild  Sleep:  Poor   Screenings: AIMS     [facility]  AIMS Total Score  0    AUDIT     [facility]  Alcohol Use Disorder Identification Test Final Score (AUDIT)  18       Assessment and Plan: This patient is a 59 year old male with a history of polysubstance abuse severe COPD with acute exacerbations, bipolar disorder and recent hospitalization for acute COPD.  He is very agitated and all the above mental status findings were by report from the nursing staff.  He was unable to talk with me on the phone.  It sounds as if he is very  agitated and out-of-control and could pose a risk to others in the nursing facility.  Therefore I directed them to bring him back to the emergency room for evaluation.  He probably needs a general psych admission to reorganize his medications.  There may also be new organic reasons for his agitation and confusion.  For now we will change olanzapine to a standing order at 10 mg twice daily and add Haldol 5 mg IM or p.o. twice daily as needed for agitation.  He will continue all other medications.  Staff will update me and let us know if he is going to be brought back to the emergency room.   Diannia Ruder, MD 01/15/2020, 1:54 PM

## 2020-01-16 ENCOUNTER — Emergency Department (HOSPITAL_COMMUNITY)
Admission: EM | Admit: 2020-01-16 | Discharge: 2020-01-18 | Disposition: A | Discharge status: Home / Self Care | Payer: Medicare Other | Attending: Emergency Medicine | Admitting: Emergency Medicine

## 2020-01-16 ENCOUNTER — Encounter (HOSPITAL_COMMUNITY): Payer: Self-pay | Admitting: Emergency Medicine

## 2020-01-16 ENCOUNTER — Other Ambulatory Visit: Payer: Self-pay

## 2020-01-16 DIAGNOSIS — Z7982 Long term (current) use of aspirin: Secondary | ICD-10-CM | POA: Diagnosis not present

## 2020-01-16 DIAGNOSIS — Z20822 Contact with and (suspected) exposure to covid-19: Secondary | ICD-10-CM | POA: Diagnosis not present

## 2020-01-16 DIAGNOSIS — R456 Violent behavior: Secondary | ICD-10-CM | POA: Diagnosis not present

## 2020-01-16 DIAGNOSIS — J449 Chronic obstructive pulmonary disease, unspecified: Secondary | ICD-10-CM | POA: Diagnosis not present

## 2020-01-16 DIAGNOSIS — I110 Hypertensive heart disease with heart failure: Secondary | ICD-10-CM | POA: Insufficient documentation

## 2020-01-16 DIAGNOSIS — I5032 Chronic diastolic (congestive) heart failure: Secondary | ICD-10-CM | POA: Insufficient documentation

## 2020-01-16 DIAGNOSIS — Z79899 Other long term (current) drug therapy: Secondary | ICD-10-CM | POA: Diagnosis not present

## 2020-01-16 DIAGNOSIS — F319 Bipolar disorder, unspecified: Secondary | ICD-10-CM | POA: Insufficient documentation

## 2020-01-16 DIAGNOSIS — Z0489 Encounter for examination and observation for other specified reasons: Secondary | ICD-10-CM | POA: Diagnosis present

## 2020-01-16 DIAGNOSIS — R4689 Other symptoms and signs involving appearance and behavior: Secondary | ICD-10-CM

## 2020-01-16 DIAGNOSIS — F17210 Nicotine dependence, cigarettes, uncomplicated: Secondary | ICD-10-CM | POA: Insufficient documentation

## 2020-01-16 LAB — COMPREHENSIVE METABOLIC PANEL
ALT: 24 U/L (ref 0–44)
AST: 38 U/L (ref 15–41)
Albumin: 3.1 g/dL — ABNORMAL LOW (ref 3.5–5.0)
Alkaline Phosphatase: 67 U/L (ref 38–126)
Anion gap: 8 (ref 5–15)
BUN: 7 mg/dL (ref 6–20)
CO2: 32 mmol/L (ref 22–32)
Calcium: 8.6 mg/dL — ABNORMAL LOW (ref 8.9–10.3)
Chloride: 98 mmol/L (ref 98–111)
Creatinine, Ser: 0.63 mg/dL (ref 0.61–1.24)
GFR calc Af Amer: >60 mL/min (ref >60)
GFR calc non Af Amer: >60 mL/min (ref >60)
Glucose, Bld: 114 mg/dL — ABNORMAL HIGH (ref 70–99)
Potassium: 3.7 mmol/L (ref 3.5–5.1)
Sodium: 138 mmol/L (ref 135–145)
Total Bilirubin: 0.3 mg/dL (ref 0.3–1.2)
Total Protein: 6.5 g/dL (ref 6.5–8.1)

## 2020-01-16 LAB — RESPIRATORY PANEL BY RT PCR (FLU A&B, COVID)
Influenza A by PCR: NEGATIVE
Influenza B by PCR: NEGATIVE
SARS Coronavirus 2 by RT PCR: NEGATIVE

## 2020-01-16 LAB — CBC WITH DIFFERENTIAL/PLATELET
Abs Immature Granulocytes: 0.02 10*3/uL (ref 0.00–0.07)
Basophils Absolute: 0 10*3/uL (ref 0.0–0.1)
Basophils Relative: 1 %
Eosinophils Absolute: 0.1 10*3/uL (ref 0.0–0.5)
Eosinophils Relative: 2 %
HCT: 36.8 % — ABNORMAL LOW (ref 39.0–52.0)
Hemoglobin: 11.5 g/dL — ABNORMAL LOW (ref 13.0–17.0)
Immature Granulocytes: 1 %
Lymphocytes Relative: 19 %
Lymphs Abs: 0.8 10*3/uL (ref 0.7–4.0)
MCH: 29.2 pg (ref 26.0–34.0)
MCHC: 31.3 g/dL (ref 30.0–36.0)
MCV: 93.4 fL (ref 80.0–100.0)
Monocytes Absolute: 0.5 10*3/uL (ref 0.1–1.0)
Monocytes Relative: 12 %
Neutro Abs: 2.8 10*3/uL (ref 1.7–7.7)
Neutrophils Relative %: 65 %
Platelets: 247 10*3/uL (ref 150–400)
RBC: 3.94 MIL/uL — ABNORMAL LOW (ref 4.22–5.81)
RDW: 12.3 % (ref 11.5–15.5)
WBC: 4.2 10*3/uL (ref 4.0–10.5)
nRBC: 0 % (ref 0.0–0.2)

## 2020-01-16 LAB — ETHANOL: Alcohol, Ethyl (B): <10 mg/dL (ref <10)

## 2020-01-16 LAB — VALPROIC ACID LEVEL: Valproic Acid Lvl: 17 ug/mL — ABNORMAL LOW (ref 50.0–100.0)

## 2020-01-16 MED ORDER — THIAMINE HCL 100 MG PO TABS
100.0000 mg | ORAL_TABLET | Freq: Every day | ORAL | Status: DC
  Administered 2020-01-17 – 2020-01-18 (×2): 100 mg via ORAL
  Filled 2020-01-16 (×2): qty 1

## 2020-01-16 MED ORDER — GABAPENTIN 300 MG PO CAPS
600.0000 mg | ORAL_CAPSULE | Freq: Two times a day (BID) | ORAL | Status: DC
  Administered 2020-01-16 – 2020-01-18 (×4): 600 mg via ORAL
  Filled 2020-01-16 (×4): qty 2

## 2020-01-16 MED ORDER — GUAIFENESIN ER 600 MG PO TB12: 600.0000 mg | ORAL_TABLET | Freq: Two times a day (BID) | ORAL | Status: DC | PRN

## 2020-01-16 MED ORDER — VITAMIN B-12 1000 MCG PO TABS
1000.0000 ug | ORAL_TABLET | Freq: Every morning | ORAL | Status: DC
  Administered 2020-01-17: 1000 ug via ORAL
  Filled 2020-01-16 (×3): qty 1

## 2020-01-16 MED ORDER — ASPIRIN EC 81 MG PO TBEC
81.0000 mg | DELAYED_RELEASE_TABLET | Freq: Every day | ORAL | Status: DC
  Administered 2020-01-17 – 2020-01-18 (×2): 81 mg via ORAL
  Filled 2020-01-16 (×2): qty 1

## 2020-01-16 MED ORDER — PANTOPRAZOLE SODIUM 40 MG PO TBEC
20.0000 mg | DELAYED_RELEASE_TABLET | Freq: Every morning | ORAL | Status: DC
  Administered 2020-01-17 – 2020-01-18 (×2): 40 mg via ORAL
  Filled 2020-01-16 (×5): qty 1

## 2020-01-16 MED ORDER — METOPROLOL TARTRATE 25 MG PO TABS
12.5000 mg | ORAL_TABLET | Freq: Two times a day (BID) | ORAL | Status: DC
  Administered 2020-01-16 – 2020-01-18 (×4): 12.5 mg via ORAL
  Filled 2020-01-16 (×4): qty 1

## 2020-01-16 MED ORDER — LORAZEPAM 1 MG PO TABS
1.0000 mg | ORAL_TABLET | Freq: Two times a day (BID) | ORAL | Status: DC
  Administered 2020-01-17 – 2020-01-18 (×3): 1 mg via ORAL
  Filled 2020-01-16 (×3): qty 1

## 2020-01-16 MED ORDER — OLANZAPINE 5 MG PO TBDP
10.0000 mg | ORAL_TABLET | Freq: Two times a day (BID) | ORAL | Status: DC
  Administered 2020-01-16 – 2020-01-18 (×4): 10 mg via ORAL
  Filled 2020-01-16 (×4): qty 2

## 2020-01-16 MED ORDER — FLUOXETINE HCL 20 MG PO CAPS
40.0000 mg | ORAL_CAPSULE | Freq: Every morning | ORAL | Status: DC
  Administered 2020-01-17 – 2020-01-18 (×2): 40 mg via ORAL
  Filled 2020-01-16 (×2): qty 2

## 2020-01-16 MED ORDER — ACETAMINOPHEN 325 MG PO TABS: 650.0000 mg | ORAL_TABLET | Freq: Four times a day (QID) | ORAL | Status: DC | PRN

## 2020-01-16 MED ORDER — ADULT MULTIVITAMIN W/MINERALS CH
1.0000 | ORAL_TABLET | Freq: Every morning | ORAL | Status: DC
  Administered 2020-01-17 – 2020-01-18 (×2): 1 via ORAL
  Filled 2020-01-16 (×2): qty 1

## 2020-01-16 MED ORDER — ALBUTEROL SULFATE HFA 108 (90 BASE) MCG/ACT IN AERS: 2.0000 | INHALATION_SPRAY | RESPIRATORY_TRACT | Status: DC | PRN

## 2020-01-16 MED ORDER — DIVALPROEX SODIUM ER 500 MG PO TB24
1500.0000 mg | ORAL_TABLET | Freq: Every day | ORAL | Status: DC
  Administered 2020-01-16 – 2020-01-17 (×2): 1500 mg via ORAL
  Filled 2020-01-16 (×2): qty 3

## 2020-01-16 MED ORDER — ATORVASTATIN CALCIUM 10 MG PO TABS
20.0000 mg | ORAL_TABLET | Freq: Every evening | ORAL | Status: DC
  Administered 2020-01-16 – 2020-01-17 (×2): 20 mg via ORAL
  Filled 2020-01-16 (×2): qty 2

## 2020-01-16 MED ORDER — ENSURE ENLIVE PO LIQD
237.0000 mL | Freq: Three times a day (TID) | ORAL | Status: DC
  Administered 2020-01-18: 237 mL via ORAL
  Filled 2020-01-16 (×8): qty 237

## 2020-01-16 NOTE — ED Notes (Signed)
Waiting on return call from [employer] for telemonitoring.
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
## 2020-01-17 ENCOUNTER — Encounter (HOSPITAL_COMMUNITY): Payer: Self-pay | Admitting: Behavioral Health

## 2020-01-17 LAB — RAPID URINE DRUG SCREEN, HOSP PERFORMED
Amphetamines: NOT DETECTED
Barbiturates: NOT DETECTED
Benzodiazepines: POSITIVE — AB
Cocaine: NOT DETECTED
Opiates: NOT DETECTED
Tetrahydrocannabinol: NOT DETECTED

## 2020-01-17 NOTE — ED Notes (Addendum)
TTS attempted assessment. Edson Snowball, Nurse Tech, unable to arouse patient, Casimiro Needle, RN, informed. TTS to attempt at later time.
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
## 2020-01-17 NOTE — ED Notes (Signed)
TTS will follow up after 7am this morning.

## 2020-01-17 NOTE — Progress Notes (Addendum)
Patient ID: Jeffrey [name], male   DOB: Oct 06, 1961, 59 y.o.   MRN: [id number]   Reviewed patients chart and read a note written by RN, [name] that stated that Pt emergency contact, contact number [phone], called and reported pt was supposed to be discharge to her today. Pt family reports has home care set up and plan in place. Pt family states pelican aware of care plan and is not sure why pt was brought back to the ED last night.  I spoke with patients daughter [name] at the above phone number who stated she was patients emergency contact. She confirmed the above stating that the plan was for patients to be discharged from [facility] skilled nursing facility to her care today. She added that patient was apparently acting out at the facility so instead of them contacting her, they took him back to the ED. She states that she had set up home care services for patient along with follow-up with his outpatient psychiatric provider [name] which is scheduled for 02/20/2020. She states that she hs no problem caring for her father despite of his behaviors issudes reported and further states that her fathers behaviors are mostly related to him not wanting to live in the facility and adds," he got exactly what he wanted, getting out the facility and going back tot he hospital."   I discussed with her due to his reported level of aggression and manic state, it would be most appropriate  to at least monitor him overnight. I advised her that I would discuss patients case with [facility] treatment team following re-evaluation of patient along with evaluation of mood and behaviors to see if he is safe and stable enough for discharge home. She was receptive to plan.

## 2020-01-17 NOTE — ED Notes (Signed)
Patient resting comfortably at this time.

## 2020-01-17 NOTE — ED Notes (Addendum)
Pt emergency contact, contact number [phone], called and reported pt was supposed to be discharge to her today. Pt family reports has home care set up and plan in place. Pt family states pelican aware of care plan and is not sure why pt was brought back to the ED last night.  Attempted to call [facility] and get pt care plan update. No answer. Voicemail left with call back information.

## 2020-01-17 NOTE — ED Notes (Signed)
[facility] called and stated that patient was to be observed for the night and then would be re-evaluated for dispo in the morning. [facility] also asked if ED could notify pt family.  Notified pt's emergency contact and updated on plan of care.

## 2020-01-17 NOTE — ED Provider Notes (Signed)
Emergency Medicine Observation Re-evaluation Note  Jeffrey PLATTEN is a 59 y.o. male, seen on rounds today.  Pt initially presented to the ED for complaints of psychiatric evaluation, hx bipolar, and with agitated/aggressive behaviors.  Currently, the patient is calm, awaiting BH eval.   Physical Exam  BP 129/78   Pulse 88   Temp 98 F (36.7 C)   Resp 18   Wt 65 kg   SpO2 100%   BMI 20.56 kg/m  Physical Exam  ED Course / MDM  EKG:EKG Interpretation  Date/Time:  Tuesday January 16 2020 22:39:08 EST Ventricular Rate:  85 PR Interval:    QRS Duration: 89 QT Interval:  391 QTC Calculation: 465 R Axis:   75 Text Interpretation: Sinus rhythm no acute st/ts similar to prior 7/20 Confirmed by Meridee Score [phone]) on 01/16/2020 10:56:23 PM    I have reviewed the labs performed to date as well as medications administered while in observation.  Recent changes in the last 24 hours include med management, and BH reassessment.  Plan  Current plan is for [facility] re-eval this AM.  Patient is not under full IVC at this time.     Cathren Laine, MD 01/17/20 [phone]

## 2020-01-17 NOTE — ED Notes (Signed)
Patient sleeping, attempted to wake patient to do TTS consult. Patient not able to complete.Patient went back to sleep.

## 2020-01-17 NOTE — ED Notes (Signed)
Patient assisted to restroom via wheelchair. Patient cooperative and calm at this time. Patient returned to bed.

## 2020-01-17 NOTE — Progress Notes (Signed)
Pt meets inpatient criteria. Referral information has been sent to the following hospitals for review:  [facility] Details  [facility] Details  [facility] Details  [facility] Details  [facility] Details  [facility] Details  [facility] Details  [facility] Details [facility]  Disposition will continue to follow for inpatient placement needs.   Wells Guiles, LCSW, LCAS Disposition CSW [facility]/TTS [phone]

## 2020-01-17 NOTE — ED Notes (Signed)
Reviewed consult note, no disposition listed. Called [facility] to clarify, no answer at this time.

## 2020-01-17 NOTE — Progress Notes (Addendum)
CSW returned RN's phone call. CSW stated plan is for patient to stay overnight at [facility] ED for observation. Plan for re-assessment by TTS in the morning.   Drucilla Schmidt, MSW, LCSW-A Clinical Disposition Social Worker [facility]/TTS [phone]

## 2020-01-17 NOTE — ED Notes (Signed)
Patient has had no outburst tonight. Patient has been in bed. Patient asleep at this time.

## 2020-01-18 NOTE — Discharge Instructions (Addendum)
Follow-up as instructed by behavioral health 
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
## 2020-01-18 NOTE — ED Provider Notes (Signed)
Emergency Medicine Observation Re-evaluation Note  Jeffrey [name] is a 59 y.o. male, seen on rounds today.  Pt initially presented to the ED for complaints of Medical Clearance Currently, the patient is alert in no distress  Physical Exam  BP 116/61 (BP Location: Left Arm)   Pulse 83   Temp 98.1 F (36.7 C) (Oral)   Resp 18   Wt 65 kg   SpO2 93%   BMI 20.56 kg/m  Physical Exam  ED Course / MDM  EKG:EKG Interpretation  Date/Time:  Tuesday January 16 2020 22:39:08 EST Ventricular Rate:  85 PR Interval:    QRS Duration: 89 QT Interval:  391 QTC Calculation: 465 R Axis:   75 Text Interpretation: Sinus rhythm Normal ECG When compared with ECG of 12/24/2019, No significant change was found Confirmed by Dione Booze ([id number]) on 01/18/2020 5:42:12 AM    I have reviewed the labs performed to date as well as medications administered while in observation.  Recent changes in the last 24 hours include none Plan  Current plan is for geri psyc e.   Bethann Berkshire, MD 01/18/20 [phone]

## 2020-01-23 ENCOUNTER — Telehealth (HOSPITAL_COMMUNITY): Payer: Self-pay | Admitting: *Deleted

## 2020-01-23 NOTE — Telephone Encounter (Signed)
[employer] Rx APPROVED OLANZapine zydis (ZYPREXA) 10 MG disintegrating tablet   P.A. # [id number]                        EFFECTIVE: 2--23--21  THRU  12--31--21

## 2020-01-29 IMAGING — US US THORACENTESIS ASP PLEURAL SPACE W/IMG GUIDE
1 series · 3 of 3 positions shown · non-contrast
Comparison: none

INDICATION: LEFT pleural effusion

[Series 1: us thoracentesis asp pleural space w/img guide · 3 of 3 slices shown]
[im 1/3]
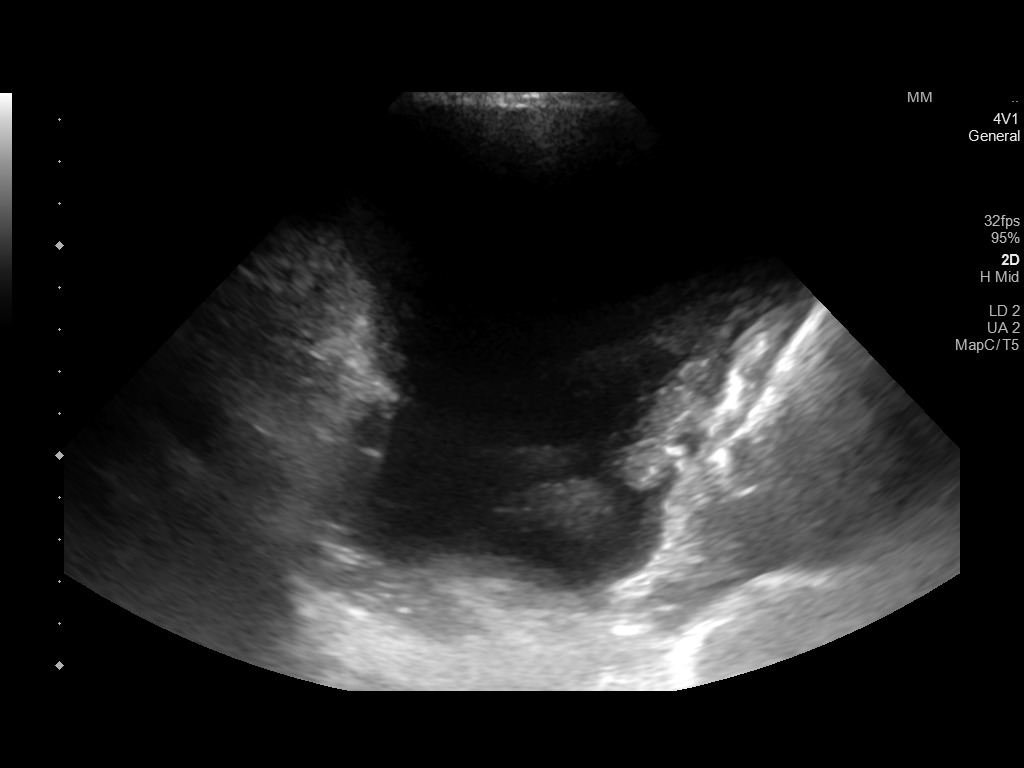
[im 2/3]
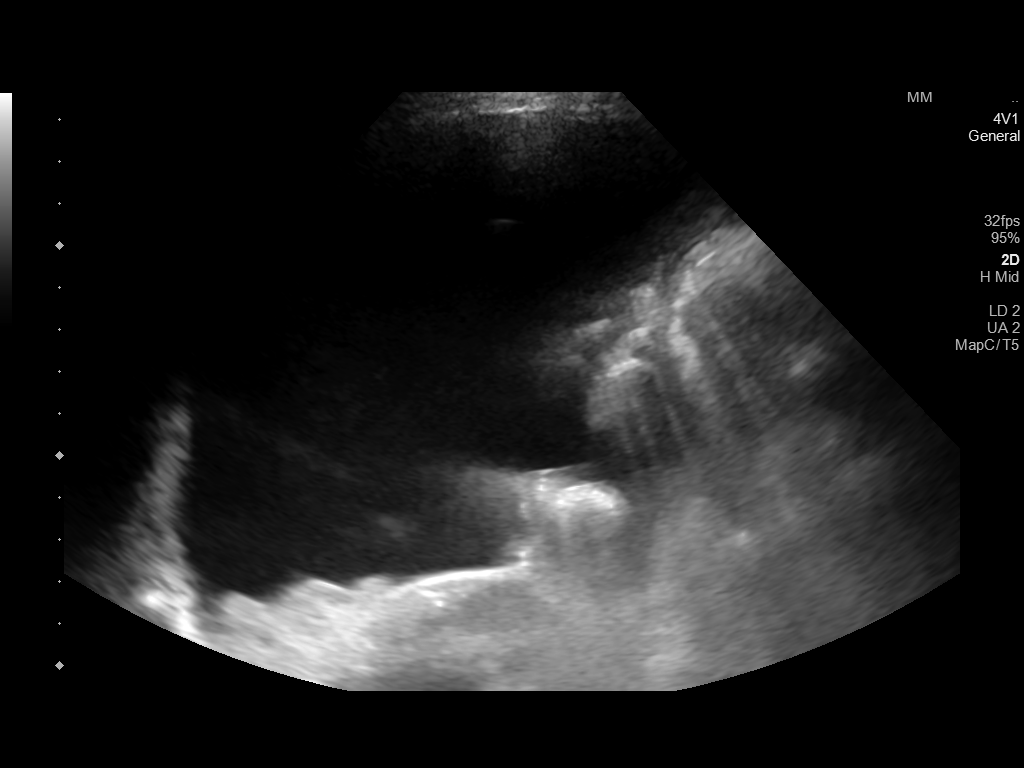
[im 3/3]
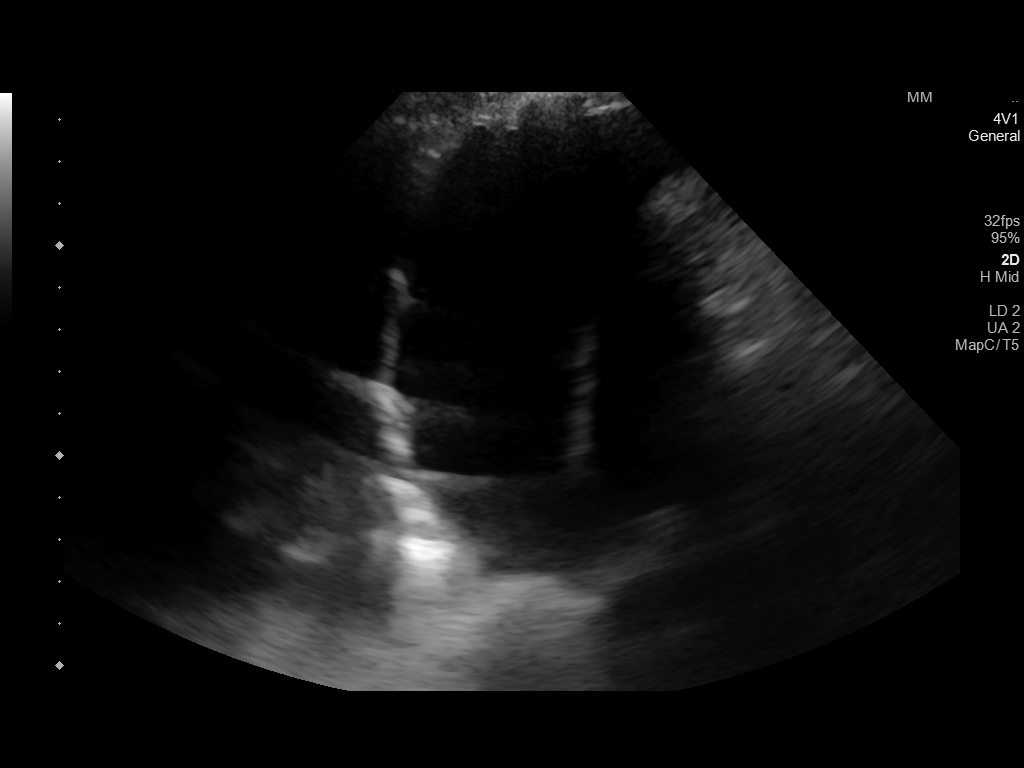

[3 of 3 positions shown; findings below may reference images not displayed]

EXAM:
ULTRASOUND GUIDED DIAGNOSTIC AND THERAPEUTIC LEFT THORACENTESIS

MEDICATIONS:
None.

COMPLICATIONS:
None immediate.

PROCEDURE:
Procedure, benefits, and risks of procedure were discussed with
patient.

Written informed consent for procedure was obtained.

Time out protocol followed.

Pleural effusion localized by ultrasound at the posterior LEFT
hemithorax.

Skin prepped and draped in usual sterile fashion.

Skin and soft tissues anesthetized with 10 mL of 1% lidocaine.

8 French thoracentesis catheter placed into the LEFT pleural space.

700 mL of serosanguineous LEFT pleural fluid aspirated by syringe
pump.

Procedure tolerated well by patient without immediate complication.
FINDINGS: A total of approximately 700 mL of LEFT pleural fluid was removed.

Samples were sent to the laboratory as requested by the clinical
team.
IMPRESSION: Successful ultrasound guided LEFT thoracentesis yielding 700 mL of
pleural fluid.

## 2020-02-20 ENCOUNTER — Other Ambulatory Visit: Payer: Self-pay

## 2020-02-20 ENCOUNTER — Ambulatory Visit (HOSPITAL_COMMUNITY): Payer: Medicare Other | Admitting: Psychiatry

## 2020-02-20 ENCOUNTER — Telehealth (HOSPITAL_COMMUNITY): Payer: Self-pay | Admitting: Psychiatry

## 2020-02-22 ENCOUNTER — Ambulatory Visit (INDEPENDENT_AMBULATORY_CARE_PROVIDER_SITE_OTHER): Payer: Medicare Other | Admitting: Psychiatry

## 2020-02-22 ENCOUNTER — Other Ambulatory Visit: Payer: Self-pay

## 2020-02-22 ENCOUNTER — Encounter (HOSPITAL_COMMUNITY): Payer: Self-pay | Admitting: Psychiatry

## 2020-02-22 DIAGNOSIS — F3162 Bipolar disorder, current episode mixed, moderate: Secondary | ICD-10-CM | POA: Diagnosis not present

## 2020-02-22 MED ORDER — GABAPENTIN 300 MG PO CAPS: 600.0000 mg | ORAL_CAPSULE | Freq: Two times a day (BID) | ORAL | 2 refills | Status: DC

## 2020-02-22 MED ORDER — OLANZAPINE 10 MG PO TBDP: 10.0000 mg | ORAL_TABLET | Freq: Two times a day (BID) | ORAL | 2 refills | Status: DC

## 2020-02-22 MED ORDER — DIVALPROEX SODIUM ER 500 MG PO TB24: 1500.0000 mg | ORAL_TABLET | Freq: Every day | ORAL | 2 refills | Status: DC

## 2020-02-22 MED ORDER — FLUOXETINE HCL 40 MG PO CAPS: 40.0000 mg | ORAL_CAPSULE | Freq: Every morning | ORAL | 2 refills | Status: DC

## 2020-02-22 MED ORDER — LORAZEPAM 1 MG PO TABS: 1.0000 mg | ORAL_TABLET | Freq: Two times a day (BID) | ORAL | 2 refills | Status: DC

## 2020-02-22 NOTE — Progress Notes (Signed)
Virtual Visit via Telephone Note  I connected with Jeffrey [name] on 02/22/20 at  2:20 PM EDT by telephone and verified that I am speaking with the correct person using two identifiers.   I discussed the limitations, risks, security and privacy concerns of performing an evaluation and management service by telephone and the availability of in person appointments. I also discussed with the patient that there may be a patient responsible charge related to this service. The patient expressed understanding and agreed to proceed.    I discussed the assessment and treatment plan with the patient. The patient was provided an opportunity to ask questions and all were answered. The patient agreed with the plan and demonstrated an understanding of the instructions.   The patient was advised to call back or seek an in-person evaluation if the symptoms worsen or if the condition fails to improve as anticipated.  I provided 15 minutes of non-face-to-face time during this encounter.   Diannia Rudereborah Adeli Frost, MD  [facility] MD/PA/NP OP Progress Note  02/22/2020 2:37 PM Jeffrey [name]  MRN:  [id number]  Chief Complaint:  Chief Complaint    Depression; Anxiety; Agitation; Follow-up     HPI: This patient is a 59 year old white male who is currently living with his ex-wife in [place].  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  The patient returns after approximately 6 weeks.  His voice was very weak and difficult to understand so I spoke primarily to his ex-wife.  He had been in a nursing home when I last talked with him and his staff.  This was after he had been hospitalized for an exacerbation of COPD and acute respiratory failure in late January.  He had been extremely confused and agitated with a lowered oxygen level.  During the time of the nursing home stay he was very agitated throwing dangerous objects around and making threats.  He was seen in the emergency room on 01/16/2020 and  cleared for admission to psychiatry preferably geropsychiatry.  However apparently his daughter came and took him home and since then he has been behaving normally.  He is a bit confused and has some memory loss but he is certainly not agitated.  I spoke with him briefly today and he is oriented but does not remember much about his hospital stays or his time in the nursing home.  His ex-wife states that he has been pleasant to be around for the most part has been sleeping and eating well and is using his oxygen as directed.  For some reason he still has a bit of Seroquel 25 mg added to the olanzapine I do not see any reason to have this type of polypharmacy so we will discontinue it. Visit Diagnosis:    ICD-10-CM   1. Moderate mixed bipolar I disorder (HCC)  F31.62     Past Psychiatric History: Several hospitalizations for depression, the last 1 in March 2019 for suicide attempt  Past Medical History:  Past Medical History:  Diagnosis Date  . Acute blood loss anemia 02/07/2017  . Anxiety   . Arthritis    deg disease, bulging disk,  shoulder level  . Bipolar disorder (HCC)   . Bipolar disorder (HCC)   . CHF (congestive heart failure) (HCC)   . 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 Depression    anxiety  . Hyperlipidemia   . Hypertension   . Peptic ulcer disease    Review  . Pneumonia   .  [name] (peripheral vascular disease) (HCC) 06/18/2015  . Shortness of breath     Past Surgical History:  Procedure Laterality Date  . BIOPSY  02/09/2017   Procedure: BIOPSY;  Surgeon: [name], MD;  [facility];  Service: Endoscopy;;  duodenal gastric  . COLONOSCOPY  03/14/2007   [id number] colonoscopy and terminal ileoscopy except external hemorrhoids  . COLONOSCOPY N/A 05/05/2013   [name]: external/internal anal canal hemorrhoids, unable to intubate TI, segemental biopsies unremarkable   . COLONOSCOPY N/A 04/11/2017   Procedure: COLONOSCOPY;  Surgeon: [name], MD;  [facility]:  [facility];  Service: Endoscopy;  Laterality: N/A;  . COLONOSCOPY WITH PROPOFOL N/A 03/31/2017   Procedure: COLONOSCOPY WITH PROPOFOL;  Surgeon: [name], MD;  [facility];  Service: Endoscopy;  Laterality: N/A;  1:45pm  . ESOPHAGOGASTRODUODENOSCOPY  03/14/2007   [id number] antral gastritis with bulbar duodenitis/paucity to postbulbar duodenal folds and biopsy were benign with no evidence of villous atrophy.  . ESOPHAGOGASTRODUODENOSCOPY (EGD) WITH PROPOFOL N/A 02/09/2017   Procedure: ESOPHAGOGASTRODUODENOSCOPY (EGD) WITH PROPOFOL;  Surgeon: [name], MD;  [facility];  Service: Endoscopy;  Laterality: N/A;  . GIVENS CAPSULE STUDY N/A 04/08/2017   Procedure: GIVENS CAPSULE STUDY;  Surgeon: [name], MD;  [facility];  Service: Endoscopy;  Laterality: N/A;  . HAND SURGERY     left, secondary to self-inflicted laceration  . HEMORRHOID SURGERY N/A 04/14/2017   Procedure: HEMORRHOIDECTOMY;  Surgeon: [name], MD;  Location: [facility];  Service: General;  Laterality: N/A;  . SHOULDER SURGERY     right  . TOE SURGERY     left great toe , amputated-lawnmover accident    Family Psychiatric History: see below  Family History:  Family History  Problem Relation Age of Onset  . Breast cancer Mother        deceased  . Heart disease Father   . Depression Daughter   . Anxiety disorder Daughter   . Anxiety disorder Son   . Depression Son   . Asthma Brother   . Heart attack Maternal Aunt   . Heart attack Maternal Uncle   . Heart attack Paternal Aunt   . Heart attack Paternal Uncle   . Heart attack Maternal Grandmother   . Heart attack Maternal Grandfather   . Emphysema Maternal Grandfather   . Heart attack Paternal Grandmother   . Heart attack Paternal Grandfather   . Colon cancer Neg Hx   . Liver disease Neg Hx     Social History:  Social History   Socioeconomic History  . Marital status: Divorced    Spouse name: Not on  file  . Number of children: 2  . Years of education: Not on file  . Highest education level: Not on file  Occupational History  . Occupation: Disabled  Tobacco Use  . Smoking status: Current Some Day Smoker    Packs/day: 1.00    Years: 40.00    Pack years: 40.00    Types: Cigarettes    Start date: 08/04/1971  . Smokeless tobacco: Never Used  . Tobacco comment: peak rate of 2.5ppd, 1/2ppd on 11/27/2016 -- 6 cigarettes / day 01/25/18  Substance and Sexual Activity  . Alcohol use: No    Alcohol/week: 0.0 standard drinks  . Drug use: Not Currently    Types: Marijuana    Comment: most days   . Sexual activity: Not Currently  Other Topics Concern  . Not on  file  Social History Narrative   Originally from [place]. Previously has lived in [facility] [place]. 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 Questionable asbestos exposure. Does have significant exposure to fumes. No mold exposure. No bird exposure. No pets currently.  Pt lives alone in [place].   Social Determinants of Health   Financial Resource Strain:   . Difficulty of Paying Living Expenses:   Food Insecurity:   . Worried About Programme researcher, broadcasting/film/video in the Last Year:   . Barista in the Last Year:   Transportation Needs:   . Freight forwarder (Medical):   Marland Kitchen Lack of Transportation (Non-Medical):   Physical Activity:   . Days of Exercise per Week:   . Minutes of Exercise per Session:   Stress:   . Feeling of Stress :   Social Connections:   . Frequency of Communication with Friends and Family:   . Frequency of Social Gatherings with Friends and Family:   . Attends Religious Services:   . Active Member of Clubs or Organizations:   . Attends Banker Meetings:   Marland Kitchen Marital Status:     Allergies:  Allergies  Allergen Reactions  . Augmentin [Amoxicillin-Pot Clavulanate] Rash and Other (See Comments)    Tolerated Zosyn 12/27/2019 Has patient had a  PCN reaction causing immediate rash, facial/tongue/throat swelling, SOB or lightheadedness with hypotension: No Has patient had a PCN reaction causing severe rash involving mucus membranes or skin necrosis: No Has patient had a PCN reaction that required hospitalization No Has patient had a PCN reaction occurring within the last 10 years: Yes If all of the above answers are "NO", then may proceed with Cephalosporin use.  . Ace Inhibitors Hives    Metabolic Disorder Labs: No results found for: HGBA1C, MPG No results found for: PROLACTIN Lab Results  Component Value Date   TRIG 84 12/24/2019   Lab Results  Component Value Date   TSH 1.552 05/01/2019   TSH 0.787 12/22/2018    Therapeutic Level Labs: No results found for: LITHIUM Lab Results  Component Value Date   VALPROATE 17 (L) 01/16/2020   VALPROATE 31 (L) 12/24/2019   No components found for:  CBMZ  Current Medications: Current Outpatient Medications  Medication Sig Dispense Refill  . acetaminophen (TYLENOL) 325 MG tablet Take 2 tablets (650 mg total) by mouth every 6 (six) hours as needed for mild pain, fever or headache (or Fever >/= 101). 12 tablet 0  . albuterol (VENTOLIN HFA) 108 (90 Base) MCG/ACT inhaler Inhale 2 puffs into the lungs every 4 (four) hours as needed for wheezing or shortness of breath. 6.7 g 3  . aspirin EC 81 MG tablet Take 1 tablet (81 mg total) by mouth daily with breakfast. 30 tablet 4  . atorvastatin (LIPITOR) 20 MG tablet Take 1 tablet (20 mg total) by mouth daily. For high cholesterol (Patient taking differently: Take 20 mg by mouth every evening. For high cholesterol) 30 tablet 0  . diltiazem (CARDIZEM CD) 180 MG 24 hr capsule Take 180 mg by mouth daily.    . divalproex (DEPAKOTE ER) 500 MG 24 hr tablet Take 3 tablets (1,500 mg total) by mouth at bedtime. 90 tablet 2  . feeding supplement, ENSURE ENLIVE, (ENSURE ENLIVE) LIQD Take 237 mLs by mouth 3 (three) times daily between meals. 2370 mL 4  .  FLUoxetine (PROZAC) 40 MG capsule Take 1 capsule (40  mg total) by mouth every morning. For mood control 30 capsule 2  . folic acid (FOLVITE) 1 MG tablet Take 1 mg by mouth every morning.     . furosemide (LASIX) 40 MG tablet Take 40 mg by mouth daily.    Marland Kitchen gabapentin (NEURONTIN) 300 MG capsule Take 2 capsules (600 mg total) by mouth 2 (two) times daily. 120 capsule 2  . guaiFENesin (MUCINEX) 600 MG 12 hr tablet Take 1 tablet (600 mg total) by mouth 2 (two) times daily as needed for cough or to loosen phlegm. 20 tablet 0  . ipratropium-albuterol (DUONEB) 0.5-2.5 (3) MG/3ML SOLN Take 3 mLs by nebulization 2 (two) times daily. 360 mL 0  . LORazepam (ATIVAN) 1 MG tablet Take 1 tablet (1 mg total) by mouth 2 (two) times daily. 60 tablet 2  . methocarbamol (ROBAXIN) 500 MG tablet Take 500 mg by mouth every 8 (eight) hours as needed.    . metoprolol tartrate (LOPRESSOR) 25 MG tablet Take 0.5 tablets (12.5 mg total) by mouth 2 (two) times daily. 30 tablet 2  . Multiple Vitamin (MULTIVITAMIN WITH MINERALS) TABS tablet Take 1 tablet by mouth daily. (Patient taking differently: Take 1 tablet by mouth every morning. ) 30 tablet 0  . OLANZapine zydis (ZYPREXA) 10 MG disintegrating tablet Take 1 tablet (10 mg total) by mouth 2 (two) times daily. 60 tablet 2  . pantoprazole (PROTONIX) 20 MG tablet Take 20 mg by mouth every morning.     . thiamine 100 MG tablet Take 1 tablet (100 mg total) by mouth daily. 30 tablet 1  . vitamin B-12 (CYANOCOBALAMIN) 500 MCG tablet Take 1,000 mcg by mouth every morning.      No current facility-administered medications for this visit.     Musculoskeletal: Strength & Muscle Tone: within normal limits Gait & Station: normal Patient leans: N/A  Psychiatric Specialty Exam: Review of Systems  HENT: Positive for mouth sores and voice change.   Psychiatric/Behavioral: Positive for confusion and decreased concentration.  All other systems reviewed and are negative.   There were  no vitals taken for this visit.There is no height or weight on file to calculate BMI.  General Appearance: NA  Eye Contact:  NA  Speech:  Slow  Volume:  Decreased  Mood:  Anxious  Affect:  NA  Thought Process:  Goal Directed  Orientation:  Full (Time, Place, and Person)  Thought Content: WDL   Suicidal Thoughts:  No  Homicidal Thoughts:  No  Memory:  Immediate;   Fair Recent;   Poor Remote;   Poor  Judgement:  Impaired  Insight:  Shallow  Psychomotor Activity:  Decreased  Concentration:  Concentration: Poor and Attention Span: Fair  Recall:  [employer] of Knowledge: Fair  Language: Fair  Akathisia:  No  Handed:  Right  AIMS (if indicated): not done  Assets:  Communication Skills Desire for Improvement Resilience Social Support  ADL's:  Intact  Cognition: Impaired,  Mild  Sleep:  Good   Screenings: AIMS     [facility]  AIMS Total Score  0    AUDIT     [facility]  Alcohol Use Disorder Identification Test Final Score (AUDIT)  18       Assessment and Plan: This patient is a 59 year old male with a history of bipolar disorder and polysubstance abuse.  He also has severe COPD and has  had several recent exacerbations which have dropped his oxygen level and perhaps destabilized his mood and cognition.  For now however he has been stable staying with his ex-wife.  He will continue Depakote ER 1500 mg at bedtime for mood stabilization, Prozac 40 mg daily for depression, gabapentin 400 mg 3 times daily for anxiety, olanzapine 10 mg twice daily for mood stabilization, and Ativan 1 mg twice daily for anxiety.  He will return to see me in 2 months   Levonne Spiller, MD 02/22/2020, 2:37 PM

## 2020-03-19 ENCOUNTER — Other Ambulatory Visit (HOSPITAL_COMMUNITY): Payer: Self-pay | Admitting: Psychiatry

## 2020-03-20 ENCOUNTER — Other Ambulatory Visit (HOSPITAL_COMMUNITY): Payer: Self-pay | Admitting: Psychiatry

## 2020-03-21 ENCOUNTER — Other Ambulatory Visit (HOSPITAL_COMMUNITY): Payer: Self-pay | Admitting: Psychiatry

## 2020-03-29 ENCOUNTER — Ambulatory Visit: Payer: Medicare Other | Admitting: Internal Medicine

## 2020-03-29 ENCOUNTER — Encounter: Payer: Self-pay | Admitting: Internal Medicine

## 2020-03-29 ENCOUNTER — Other Ambulatory Visit: Payer: Self-pay

## 2020-03-29 DIAGNOSIS — J9612 Chronic respiratory failure with hypercapnia: Secondary | ICD-10-CM

## 2020-03-29 DIAGNOSIS — J9611 Chronic respiratory failure with hypoxia: Secondary | ICD-10-CM | POA: Diagnosis not present

## 2020-03-29 DIAGNOSIS — J449 Chronic obstructive pulmonary disease, unspecified: Secondary | ICD-10-CM | POA: Diagnosis not present

## 2020-03-29 MED ORDER — TRELEGY ELLIPTA 100-62.5-25 MCG/INH IN AEPB: 1.0000 | INHALATION_SPRAY | Freq: Every day | RESPIRATORY_TRACT | 0 refills | Status: DC

## 2020-03-29 MED ORDER — ALBUTEROL SULFATE (2.5 MG/3ML) 0.083% IN NEBU: 2.5000 mg | INHALATION_SOLUTION | RESPIRATORY_TRACT | 12 refills | Status: DC | PRN

## 2020-03-29 NOTE — Patient Instructions (Addendum)
Plan A = Automatic = Always=    Trelegy one click each am - take two good deep drags and out thru nose   Plan B = Backup (to supplement plan A, not to replace it) Only use your albuterol inhaler as a rescue medication to be used if you can't catch your breath by resting or doing a relaxed purse lip breathing pattern.  - The less you use it, the better it will work when you need it. - Ok to use the inhaler up to 2 puffs  every 4 hours if you must but call for appointment if use goes up over your usual need - Don't leave home without it !!  (think of it like the spare tire for your car)   Plan C = Crisis (instead of Plan B but only if Plan B stops working) - only use your albuterol nebulizer if you first try Plan B and it fails to help > ok to use the nebulizer up to every 4 hours but if start needing it regularly call for immediate appointment    Make sure you check your oxygen saturations at highest level of activity to be sure it stays over 90% (low 90s is perfect) and adjust upward to maintain this level if needed but remember to turn it back to previous settings when you stop (to conserve your supply).    Please schedule a follow up office visit in 4 weeks, sooner if needed  with all respiratory  medications /inhalers/ solutions in hand so we can verify exactly what you are taking. This includes all medications from all doctors and over the counters Add cxr needed   Also added best fit for amb 02 order

## 2020-03-29 NOTE — Progress Notes (Signed)
Jeffrey [name], male    DOB: [birth year], 60 y.o.   MRN: [id number]   Brief patient profile:  43 yowm MM  Quit smoking 09/2019 at 5lpm 24/7 and not much better since self referred to [facility] 03/29/2020 p prior rx by [facility]/ [facility]   PFT 10/09/16 >> FEV1 1.84 (49%), FEV1% 48, TLC 7.39 (105%), DLCO 36% A1AT 10/09/16 >> 215, MM Spirometry 02/02/17 >> FEV1 2.00 (55%), FEV1% 54     Admission date:  12/24/2019   Discharge Date:  01/10/2020   Primary MD  Gareth Morgan, MD   Discharge Diagnosis  Encephalopathy [G93.40] History of ETT [Z92.89] Acute on chronic respiratory failure with hypercapnia (HCC) [J96.22] Acute respiratory failure with hypoxia and hypercapnia (HCC) [J96.01, J96.02]   Active Problems:   Bipolar disorder with severe depression (HCC)   Essential hypertension   COPD with acute exacerbation (HCC)   Acute on chronic respiratory failure with hypercapnia (HCC)   Acute on chronic respiratory failure with hypoxia and hypercapnia (HCC)   Lobar pneumonia (HCC)   Acute metabolic encephalopathy   Acute on chronic diastolic CHF (congestive heart failure) (HCC)   Aspiration pneumonitis (HCC)        HPI  from the history and physical done on the day of admission:    Chief Complaint:AMS,difficulty breathing  HPI:  Per chart review patient was brought into the ED reports of difficulty breathing x 3 days, with confusion, and in the ED patient was tremulous.  Patient's ex-wife who now lives with patient tells me patient has not had any drink or use any drugs inthe past year. She reports increasing difficulty breathing and productive cough. No vomiting no loose stools. She said the past 3 days when patient was confused tells when he said he wanted someone to shoot him. She otherwise denies suicidal ideations. She also reports patient has gotten tired of using oxygen, his bronchodilators, and lying around as normally he likes to be active.  Admission11/19-  11/70for acute hypercapnic respiratory failure secondary to COPD exacerbation and component of CHF.RequiredPrecedex drip for agitation.Discharged home on 3 to 4 L nasal cannula which was his baseline.  ED Course:Tachypneic, O2 sats 91 to 95% on nasal cannula 4 L. But with worsening mental status, patient not responding, and thenagitated, pullingat lines and tubes, decision was made to intubate patient as patient would not tolerate BiPAP. And sedated with propofol. ABG showed pH of 7.3, marked elevated PCO2 of 107. PaO2 <31. Covid test negative. Portable chest x-ray shows left greater than right base airspace disease, aspiration orinfection favored. IV vancomycin and cefepime started for HCAP.     Hospital Course:    Brief Narrative: 59 year old male with history of COPD is chronically on 3 to 4 L of oxygen, admitted with worsening shortness of breath. Felt to be related to COPD exacerbation. He was intubated in the emergency room and admitted to the ICU. Fortunately, he was able to extubate on 12/26/19. Hospital course further complicated by development of acute metabolic encephalopathy. Etiology is unclear, but may be related to high-dose steroids. Required Precedex infusion. Steroids have been tapered off ---Mental status overall much improved,  -Patient is cooperative, talkative and mostly appropriate -Was able to wean off Precedex drip on 01/04/2020-  -Patient is medically stable for discharge - Transfer to SNF rehab   Assessment & Plan: 1)Acute on chronic respiratory failure with hypoxia-ABG suggested mild hypercapnia as well----- secondary to COPD exacerbation and aspiration pneumonia. Patient was intubated on admission (12/24/19), but was  able to extubate 12/26/19. Overall respiratory status appears to be stable. 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 -No longer requiring IV medications for sedation -Patient is cooperative, talkative and mostly appropriate -Was able to wean off Precedex drip on 01/04/2020-  -Patient with history of EtOH abuse, denies recent EtOH use -As per daughter Ms. 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 twice daily -May use as needed ODT Zyprexa --Occasional episodes of restlessness, patient is usually redirectable -- may need further adjustments of antipsychotic/psychiatric medications to optimize mood and behavior  3)Acute COPD exacerbation secondary to presumed aspiration pneumonia --- extubated 12/26/2019 , c =-ABG was suggestive of hypoxia and hypercapnia, completed antibiotics already, prednisone discontinued 01/04/2020  4)Bipolar Disorder- Continue home psychotropic medications including olanzapine and Depakote, also continue Prozac, lorazepam and trazodone,--- additional psych medication adjustments as above ----Mental status overall much improved,  -Not requiring IV medications for sedation -Patient is cooperative, talkative and mostly appropriate ----Occasional episodes of restlessness, patient is usually redirectable -- may need further adjustments of antipsychotic/psychiatric medications to optimize mood and behavior  5)Hypertension---.c/nmetoprolol to 12.5mg  twice daily,   6)Dysphonia/Dysphagia--- voice is a bit louder, patient voice is  soft/muffled apparently this is not new (initially worsened post  intubation) -Speech pathology eval appreciated recommends-Dysphagia 3 (mechanical soft); liquids have been changed to thin liquids as of 01/05/2020 from nectar-thick liquid  7)[name]--stable, continue atorvastatin and aspirin  8)Social/Ethics-- Discussed with Daughter [name] -[phone] [facility]), Pt is a Full code  9)Tobacco Abuse----smoking cessation        History of Present Illness  03/29/2020  Pulmonary/ 1st office eval/Alantis [facility]une  Chief Complaint  Patient presents with  . Follow-up    sob with exertion.  sob with sitting still talking.  dry cough from time to time.  Dyspnea: Tol  Very little activity, MB maybe 348ft hasn't done x sev weeks prior to OV  Stop half way to catch breath  Cough: not much now/ minimal white mucus   Sleep: bed is flat / two pillows  SABA use: twice daily / neb 2x daily  02  5lpm at home with high 90's and then walk on 3lpm POC   No obvious day to day or daytime variability or assoc excess/ purulent sputum or mucus plugs or hemoptysis or cp or chest tightness, subjective wheeze or overt sinus or hb symptoms.   Sleeping without nocturnal  or early am exacerbation  of respiratory  c/o's or need for noct saba. Also denies any obvious fluctuation of symptoms with weather or environmental changes or other aggravating or alleviating factors except as outlined above   No unusual exposure hx or h/o childhood pna/ asthma or knowledge of premature birth.  Current Allergies, Complete Past Medical History, Past Surgical History, Family History, and Social History were reviewed in [employer] record.  ROS  The following are not active complaints unless bolded Hoarseness, sore throat, dysphagia, dental problems, itching, sneezing,  nasal congestion or discharge of excess mucus or purulent secretions, ear  ache,   fever, chills, sweats, unintended wt loss or wt gain, classically pleuritic or exertional cp,  orthopnea pnd or arm/hand swelling  or  leg swelling, presyncope, palpitations, abdominal pain, anorexia, nausea, vomiting, diarrhea  or change in bowel habits or change in bladder habits, change in stools or change in urine, dysuria, hematuria,  rash, arthralgias, visual complaints, headache, numbness, weakness or ataxia or problems with walking or coordination,  change in mood= anxious or  memory.           Past Medical History:  Diagnosis Date  . Acute blood loss anemia 02/07/2017  . Anxiety   . Arthritis    deg disease, bulging disk,  shoulder level  . Bipolar disorder (HCC)   . Bipolar disorder (HCC)   . CHF (congestive heart failure) (HCC)   . 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 Depression    anxiety  . Hyperlipidemia   . Hypertension   . Peptic ulcer disease    Review  . Pneumonia   . [name] (peripheral vascular disease) (HCC) 06/18/2015  . Shortness of breath     Outpatient Medications Prior to Visit  Medication Sig Dispense Refill  . acetaminophen (TYLENOL) 325 MG tablet Take 2 tablets (650 mg total) by mouth every 6 (six) hours as needed for mild pain, fever or headache (or Fever >/= 101). 12 tablet 0  . albuterol (VENTOLIN HFA) 108 (90 Base) MCG/ACT inhaler Inhale 2 puffs into the lungs every 4 (four) hours as needed for wheezing or shortness of breath. 6.7 g 3  . aspirin EC 81 MG tablet Take 1 tablet (81 mg total) by mouth daily with breakfast. 30 tablet 4  . atorvastatin (LIPITOR) 20 MG tablet Take 1 tablet (20 mg total) by mouth daily. For high cholesterol (Patient taking differently: Take 20 mg by mouth every evening. For high cholesterol) 30 tablet 0  . diltiazem (CARDIZEM CD) 180 MG 24 hr capsule Take 180 mg by mouth daily.    . divalproex (DEPAKOTE ER) 500 MG 24 hr tablet Take 3 tablets (1,500 mg total) by mouth at bedtime. 90 tablet 2  . FLUoxetine (PROZAC) 40 MG capsule Take 1 capsule (40 mg total) by mouth every morning. For mood control 30 capsule 2  . folic acid (FOLVITE) 1 MG tablet Take 1 mg by mouth  every morning.     . furosemide (LASIX) 40 MG tablet Take 40 mg by mouth daily.    Marland Kitchen gabapentin (NEURONTIN) 300 MG capsule Take 2 capsules (600 mg total) by mouth 2 (two) times daily. 120 capsule 2  . ipratropium-albuterol (DUONEB) 0.5-2.5 (3) MG/3ML SOLN Take 3 mLs by nebulization 2 (two) times daily. 360 mL 0  . Multiple Vitamin (MULTIVITAMIN WITH MINERALS) TABS tablet Take 1 tablet by mouth daily. (Patient taking differently: Take 1 tablet by mouth every morning. ) 30 tablet 0  . OLANZapine zydis (ZYPREXA) 10 MG disintegrating tablet Take 1 tablet (10 mg total) by mouth 2 (two) times daily. 60 tablet 2  . pantoprazole (PROTONIX) 20 MG tablet Take 20 mg by mouth every morning.     . vitamin B-12 (CYANOCOBALAMIN) 500 MCG tablet Take 1,000 mcg by mouth every morning.     . feeding supplement, ENSURE ENLIVE, (ENSURE ENLIVE) LIQD Take 237 mLs by mouth 3 (three) times daily between meals. (Patient not taking: Reported on 03/29/2020) 2370 mL 4  . guaiFENesin (MUCINEX) 600 MG 12 hr tablet Take 1 tablet (600 mg total) by mouth 2 (two) times daily  as needed for cough or to loosen phlegm. (Patient not taking: Reported on 03/29/2020) 20 tablet 0  . LORazepam (ATIVAN) 1 MG tablet Take 1 tablet (1 mg total) by mouth 2 (two) times daily. (Patient not taking: Reported on 03/29/2020) 60 tablet 2  . methocarbamol (ROBAXIN) 500 MG tablet Take 500 mg by mouth every 8 (eight) hours as needed.    . metoprolol tartrate (LOPRESSOR) 25 MG tablet Take 0.5 tablets (12.5 mg total) by mouth 2 (two) times daily. 30 tablet 2  . thiamine 100 MG tablet Take 1 tablet (100 mg total) by mouth daily. (Patient not taking: Reported on 03/29/2020) 30 tablet 1      Objective:     BP 120/80 (BP Location: Right Arm, Cuff Size: Normal)   Pulse (!) 118   Temp (!) 97.1 F (36.2 C) (Temporal)   Ht 5' 10.5" (1.791 m)   Wt 170 lb (77.1 kg)   SpO2 94%   BMI 24.05 kg/m   SpO2: 94 % O2 Flow Rate (L/min): 5 L/min   Wt Readings from Last 3  Encounters:  03/29/20 170 lb (77.1 kg)  01/16/20 143 lb 4.8 oz (65 kg)  01/10/20 143 lb 11.8 oz (65.2 kg)      Chronically ill wm >> stated age   HEENT : pt wearing mask not removed for exam due to covid -19 concerns.    NECK :  without JVD/Nodes/TM/ nl carotid upstrokes bilaterally   LUNGS: no acc muscle use,  Mod barrel  contour chest wall with bilateral  Distant bs s audible wheeze and  without cough on insp or exp maneuvers and mod  Hyperresonant  to  percussion bilaterally     CV:  RRR  no s3 or murmur or increase in P2, and no edema   ABD:  soft and nontender with pos mid insp Hoover's  in the supine position. No bruits or organomegaly appreciated, bowel sounds nl  MS:     ext warm without deformities, calf tenderness, cyanosis or clubbing No obvious joint restrictions   SKIN: warm and dry without lesions    NEURO:  alert, approp, nl sensorium with  no motor or cerebellar deficits apparent.        I personally reviewed images and agree with radiology impression as follows:  pCXR:   12/31/19 Increased bibasilar opacities are noted concerning for pneumonia or edema. Small bilateral pleural effusions are noted.      Assessment   COPD GOLD II/III but 02 dep  Quit smoking 2020 PFT 10/09/16 >> FEV1 1.84 (49%), FEV1% 48, TLC 7.39 (105%), DLCO 36% A1AT 10/09/16 >> 215, MM Spirometry 02/02/17 >> FEV1 2.00 (55%), FEV1% 54 - 03/29/2020  After extensive coaching inhaler device,  effectiveness =    90% with elipta, 75% with hfa > trelegy x 4 week trial of samples then f/u   Clearly  Group D in terms of symptom/risk and laba/lama/ICS  therefore appropriate rx at this point >>>  approp for trelegy but change backup to just albuterol using ABCDE action plan in avs/ reviewed line by line with pt     Chronic respiratory failure with hypoxia and hypercapnia (HCC) HC03 level  01/16/20  = 32  -  03/29/2020  Walked slow pace on 5lpm POC with sats starting at 92 and dropping to 82%    Present POC not adequate, will need re -eval for BEST FIT but advised goal is to keep sats in low 90s at all times  Also needs f/u cxr now and f/u HRCT if still not back to baseline since last  ? CAP in feb 2021           Each maintenance medication was reviewed in detail including emphasizing most importantly the difference between maintenance and prns and under what circumstances the prns are to be triggered using an action plan format where appropriate.  Total time for H and P, chart review, counseling, teaching device/  directly observing portions of ambulatory 02 saturation study/  and generating customized AVS unique to this office visit / charting = 40 min for pt new to me post hosp admit            Sandrea Hughs, MD 03/29/2020

## 2020-03-30 ENCOUNTER — Encounter: Payer: Self-pay | Admitting: Internal Medicine

## 2020-03-30 DIAGNOSIS — J9611 Chronic respiratory failure with hypoxia: Secondary | ICD-10-CM | POA: Insufficient documentation

## 2020-03-30 DIAGNOSIS — J9612 Chronic respiratory failure with hypercapnia: Secondary | ICD-10-CM | POA: Insufficient documentation

## 2020-03-30 NOTE — Assessment & Plan Note (Signed)
Quit smoking 2020 PFT 10/09/16 >> FEV1 1.84 (49%), FEV1% 48, TLC 7.39 (105%), DLCO 36% A1AT 10/09/16 >> 215, MM Spirometry 02/02/17 >> FEV1 2.00 (55%), FEV1% 54 - 03/29/2020  After extensive coaching inhaler device,  effectiveness =    90% with elipta, 75% with hfa > trelegy x 4 week trial of samples then f/u   Clearly  Group D in terms of symptom/risk and laba/lama/ICS  therefore appropriate rx at this point >>>  approp for trelegy but change backup to just albuterol using ABCDE action plan in avs/ reviewed line by line with pt

## 2020-03-30 NOTE — Assessment & Plan Note (Addendum)
HC03 level  01/16/20  = 32  -  03/29/2020  Walked slow pace on 5lpm POC with sats starting at 92 and dropping to 82%   Present POC not adequate, will need re -eval for BEST FIT but advised goal is to keep sats in low 90s at all times   Also needs f/u cxr now and f/u HRCT if still not back to baseline since last  ? CAP in feb 2021           Each maintenance medication was reviewed in detail including emphasizing most importantly the difference between maintenance and prns and under what circumstances the prns are to be triggered using an action plan format where appropriate.  Total time for H and P, chart review, counseling, teaching device/  directly observing portions of ambulatory 02 saturation study/  and generating customized AVS unique to this office visit / charting = 40 min for pt new to me post hosp admit

## 2020-03-31 NOTE — Addendum Note (Signed)
Addended by: Sandrea Hughs B on: 03/31/2020 03:56 PM   Modules accepted: Orders

## 2020-04-02 ENCOUNTER — Telehealth: Payer: Self-pay | Admitting: *Deleted

## 2020-04-02 DIAGNOSIS — J449 Chronic obstructive pulmonary disease, unspecified: Secondary | ICD-10-CM

## 2020-04-02 NOTE — Telephone Encounter (Signed)
Spoke with the pt  He states willing to go to [facility] and get cxr this wk  Order placed

## 2020-04-02 NOTE — Telephone Encounter (Signed)
-----   Message from Nyoka Cowden, MD sent at 03/30/2020  6:08 AM EDT ----- Needs cxr dx copd mixed - see if he'll go this week to complete his w/u if not can do prior to next ov

## 2020-04-04 ENCOUNTER — Telehealth (HOSPITAL_COMMUNITY): Payer: Self-pay

## 2020-04-04 ENCOUNTER — Ambulatory Visit (HOSPITAL_COMMUNITY)
Admission: RE | Admit: 2020-04-04 | Discharge: 2020-04-04 | Disposition: A | Discharge status: Home / Self Care | Payer: Medicare Other | Source: Ambulatory Visit | Attending: Internal Medicine | Admitting: Internal Medicine

## 2020-04-04 ENCOUNTER — Other Ambulatory Visit: Payer: Self-pay

## 2020-04-04 DIAGNOSIS — J449 Chronic obstructive pulmonary disease, unspecified: Secondary | ICD-10-CM | POA: Diagnosis not present

## 2020-04-04 NOTE — Telephone Encounter (Signed)
Patient called regarding his medication. He stated that he wants to know why his Trazodone was stopped. Please review and advise. Thank you.

## 2020-04-04 NOTE — Telephone Encounter (Signed)
Patient called regarding his medication. He stated that he wants to know why his Tra

## 2020-04-05 NOTE — Telephone Encounter (Signed)
It was stopped in the hospital due to oversedation

## 2020-04-05 NOTE — Progress Notes (Signed)
Spoke with pt and notified of results per Dr. Wert. Pt verbalized understanding and denied any questions. 

## 2020-04-10 ENCOUNTER — Telehealth: Payer: Self-pay | Admitting: Emergency Medicine

## 2020-04-10 NOTE — Telephone Encounter (Signed)
Spoke with Dewayne Hatch from [facility] [employer] calling to check on the ventilator recertification form that was faxed on 03/29/2020  [phone] . Armanda Heritage of our correct fax number and she is going to fax the reorder to [place].

## 2020-04-11 NOTE — Telephone Encounter (Signed)
We did get this form but it was all messed up. Half of the form was on the 1st page and the other part of the form was on the 2nd page.  They sent it to Dr. Work to sign but I'm not sure who actually order the vent.  I faxed the records requested and ask for a better copy of the form to be refaxed

## 2020-04-11 NOTE — Telephone Encounter (Signed)
Synetta Fail - has this form been received?

## 2020-04-12 NOTE — Telephone Encounter (Signed)
I gave this information to [facility] yesterday showing her how the form came in

## 2020-04-12 NOTE — Telephone Encounter (Signed)
Relayed message to patient caregiver

## 2020-04-26 ENCOUNTER — Ambulatory Visit: Payer: Medicare Other | Admitting: Internal Medicine

## 2020-04-26 ENCOUNTER — Encounter: Payer: Self-pay | Admitting: Internal Medicine

## 2020-04-26 ENCOUNTER — Telehealth (HOSPITAL_COMMUNITY): Payer: Medicare Other | Admitting: Psychiatry

## 2020-04-26 ENCOUNTER — Other Ambulatory Visit: Payer: Self-pay

## 2020-04-26 DIAGNOSIS — J9612 Chronic respiratory failure with hypercapnia: Secondary | ICD-10-CM | POA: Diagnosis not present

## 2020-04-26 DIAGNOSIS — J9611 Chronic respiratory failure with hypoxia: Secondary | ICD-10-CM | POA: Diagnosis not present

## 2020-04-26 DIAGNOSIS — J449 Chronic obstructive pulmonary disease, unspecified: Secondary | ICD-10-CM | POA: Diagnosis not present

## 2020-04-26 MED ORDER — TRELEGY ELLIPTA 100-62.5-25 MCG/INH IN AEPB: 1.0000 | INHALATION_SPRAY | Freq: Every day | RESPIRATORY_TRACT | 0 refills | Status: DC

## 2020-04-26 MED ORDER — TRELEGY ELLIPTA 100-62.5-25 MCG/INH IN AEPB: INHALATION_SPRAY | RESPIRATORY_TRACT | 11 refills | Status: DC

## 2020-04-26 NOTE — Progress Notes (Addendum)
Jeffrey [name], male    DOB: 07/17/1961 .   MRN: [id number]   Brief patient profile:  10 yowm MM  Quit smoking 09/2019 at 5lpm 24/7 and not much better since self referred to [facility] clinic 03/29/2020 p prior rx by [facility]/ [facility]   PFT 10/09/16 >> FEV1 1.84 (49%), FEV1% 48, TLC 7.39 (105%), DLCO 36% A1AT 10/09/16 >> 215, MM Spirometry 02/02/17 >> FEV1 2.00 (55%), FEV1% 54     Admission date:  12/24/2019   Discharge Date:  01/10/2020   Primary MD  Gareth Morgan, MD   Discharge Diagnosis  Encephalopathy [G93.40] History of ETT [Z92.89] Acute on chronic respiratory failure with hypercapnia (HCC) [J96.22] Acute respiratory failure with hypoxia and hypercapnia (HCC) [J96.01, J96.02]   Active Problems:   Bipolar disorder with severe depression (HCC)   Essential hypertension   COPD with acute exacerbation (HCC)   Acute on chronic respiratory failure with hypercapnia (HCC)   Acute on chronic respiratory failure with hypoxia and hypercapnia (HCC)   Lobar pneumonia (HCC)   Acute metabolic encephalopathy   Acute on chronic diastolic CHF (congestive heart failure) (HCC)   Aspiration pneumonitis (HCC)        HPI  from the history and physical done on the day of admission:    Chief Complaint:AMS,difficulty breathing  HPI:  Per chart review patient was brought into the ED reports of difficulty breathing x 3 days, with confusion, and in the ED patient was tremulous.  Patient's ex-wife who now lives with patient tells me patient has not had any drink or use any drugs inthe past year. She reports increasing difficulty breathing and productive cough. No vomiting no loose stools. She said the past 3 days when patient was confused tells when he said he wanted someone to shoot him. She otherwise denies suicidal ideations. She also reports patient has gotten tired of using oxygen, his bronchodilators, and lying around as normally he likes to be active.  Admission11/19-  11/66for acute hypercapnic respiratory failure secondary to COPD exacerbation and component of CHF.RequiredPrecedex drip for agitation.Discharged home on 3 to 4 L nasal cannula which was his baseline.  ED Course:Tachypneic, O2 sats 91 to 95% on nasal cannula 4 L. But with worsening mental status, patient not responding, and thenagitated, pullingat lines and tubes, decision was made to intubate patient as patient would not tolerate BiPAP. And sedated with propofol. ABG showed pH of 7.3, marked elevated PCO2 of 107. PaO2 <31. Covid test negative. Portable chest x-ray shows left greater than right base airspace disease, aspiration orinfection favored. IV vancomycin and cefepime started for HCAP.     Hospital Course:    Brief Narrative: 59 year old male with history of COPD is chronically on 3 to 4 L of oxygen, admitted with worsening shortness of breath. Felt to be related to COPD exacerbation. He was intubated in the emergency room and admitted to the ICU. Fortunately, he was able to extubate on 12/26/19. Hospital course further complicated by development of acute metabolic encephalopathy. Etiology is unclear, but may be related to high-dose steroids. Required Precedex infusion. Steroids have been tapered off ---Mental status overall much improved,  -Patient is cooperative, talkative and mostly appropriate -Was able to wean off Precedex drip on 01/04/2020-  -Patient is medically stable for discharge - Transfer to SNF rehab   Assessment & Plan: 1)Acute on chronic respiratory failure with hypoxia-ABG suggested mild hypercapnia as well----- secondary to COPD exacerbation and aspiration pneumonia. Patient was intubated on admission (12/24/19), but was able  to extubate 12/26/19. Overall respiratory status appears to be stable. 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 -No longer requiring IV medications for sedation -Patient is cooperative, talkative and mostly appropriate -Was able to wean off Precedex drip on 01/04/2020-  -Patient with history of EtOH abuse, denies recent EtOH use -As per daughter Ms. 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 twice daily -May use as needed ODT Zyprexa --Occasional episodes of restlessness, patient is usually redirectable -- may need further adjustments of antipsychotic/psychiatric medications to optimize mood and behavior  3)Acute COPD exacerbation secondary to presumed aspiration pneumonia --- extubated 12/26/2019 , c =-ABG was suggestive of hypoxia and hypercapnia, completed antibiotics already, prednisone discontinued 01/04/2020  4)Bipolar Disorder- Continue home psychotropic medications including olanzapine and Depakote, also continue Prozac, lorazepam and trazodone,--- additional psych medication adjustments as above ----Mental status overall much improved,  -Not requiring IV medications for sedation -Patient is cooperative, talkative and mostly appropriate ----Occasional episodes of restlessness, patient is usually redirectable -- may need further adjustments of antipsychotic/psychiatric medications to optimize mood and behavior  5)Hypertension---.c/nmetoprolol to 12.5mg  twice daily,   6)Dysphonia/Dysphagia--- voice is a bit louder, patient voice is  soft/muffled apparently this is not new (initially worsened post  intubation) -Speech pathology eval appreciated recommends-Dysphagia 3 (mechanical soft); liquids have been changed to thin liquids as of 01/05/2020 from nectar-thick liquid  7)[name]--stable, continue atorvastatin and aspirin  8)Social/Ethics-- Discussed with Daughter [name] [phone] [facility]), Pt is a Full code  9)Tobacco Abuse----smoking cessation        History of Present Illness  03/29/2020  Pulmonary/ 1st office eval/Tarig Zimmers  Chief Complaint  Patient presents with  . Follow-up    sob with exertion.  sob with sitting still talking.  dry cough from time to time.  Dyspnea: Tol  Very little activity, MB maybe 317ft hasn't done x sev weeks prior to OV  Stop half way to catch breath  Cough: not much now/ minimal white mucus   Sleep: bed is flat / two pillows  SABA use: twice daily / neb 2x daily  02  5lpm at home with high 90's and then walk on 3lpm POC  rec Plan A = Automatic = Always=    Trelegy one click each am - take two good deep drags and out thru nose  Plan B = Backup (to supplement plan A, not to replace it) Only use your albuterol inhaler as a rescue medication to be used if you can't catch your breath by resting or doing a relaxed purse lip breathing pattern.  - The less you use it, the better it will work when you need it. - Ok to use the inhaler up to 2 puffs  every 4 hours if you must but call for appointment if use goes up over your usual need - Don't leave home without it !!  (think of it like the spare tire for your car)  Plan C = Crisis (instead of Plan B but only if Plan B stops working) -  only use your albuterol nebulizer if you first try Plan B and it fails to help > ok to use the nebulizer up to every 4 hours but if start needing it regularly call for immediate appointment Make sure you check your oxygen saturations at highest level of activity to be sure it stays over 90% (low 90s is perfect) and adjust upward to maintain this level if needed but remember to  turn it back to previous settings when you stop (to conserve your supply).  Please schedule a follow up office visit in 4 weeks, sooner if needed  with all respiratory  medications /inhalers/ solutions in hand so we can verify exactly what you are taking. This includes all medications from all doctors and over the counters Add cxr needed   Also added best fit for amb 02 order     04/26/2020  f/u ov/Gohan Collister re: GOLD II/III 02 on trelegy daily  S/p moderna Chief Complaint  Patient presents with  . Follow-up    Breathing has improved some since the last visit. His voice is back to normal.   Dyspnea:  Shower is easier but not the mb and desats on 3lpm POC but not adjusting  Cough: very seldone Sleeping: bed is flat / 2 pillows SABA use: once a day 02: 3lpm conc/ POC  3lpm walking but it will go to 5 and never checks sats     No obvious day to day or daytime variability or assoc excess/ purulent sputum or mucus plugs or hemoptysis or cp or chest tightness, subjective wheeze or overt sinus or hb symptoms.   Sleeping  without nocturnal  or early am exacerbation  of respiratory  c/o's or need for noct saba. Also denies any obvious fluctuation of symptoms with weather or environmental changes or other aggravating or alleviating factors except as outlined above   No unusual exposure hx or h/o childhood pna/ asthma or knowledge of premature birth.  Current Allergies, Complete Past Medical History, Past Surgical History, Family History, and Social History were reviewed in [employer] record.  ROS  The following are not active complaints unless bolded Hoarseness, sore throat, dysphagia, dental problems, itching, sneezing,  nasal congestion or discharge of excess mucus or purulent secretions, ear ache,   fever, chills, sweats, unintended wt loss or wt gain, classically pleuritic or exertional cp,  orthopnea pnd or arm/hand swelling  or leg swelling, presyncope, palpitations, abdominal  pain, anorexia, nausea, vomiting, diarrhea  or change in bowel habits or change in bladder habits, change in stools or change in urine, dysuria, hematuria,  rash, arthralgias, visual complaints, headache, numbness, weakness or ataxia or problems with walking or coordination,  change in mood or  Memory. Tremor         Current Meds  Medication Sig  . acetaminophen (TYLENOL) 325 MG tablet Take 2 tablets (650 mg total) by mouth every 6 (six) hours as needed for mild pain, fever or headache (or Fever >/= 101).  Marland Kitchen albuterol (PROVENTIL) (2.5 MG/3ML) 0.083% nebulizer solution Take 3 mLs (2.5 mg total) by nebulization every 4 (four) hours as needed for wheezing or shortness of breath.  Marland Kitchen albuterol (VENTOLIN HFA) 108 (90 Base) MCG/ACT inhaler Inhale 2 puffs into the lungs every 4 (four) hours as needed for wheezing or shortness of breath.  Marland Kitchen aspirin EC 81 MG tablet Take 1 tablet (81 mg total) by mouth daily with breakfast.  . atorvastatin (LIPITOR) 20 MG tablet Take 1 tablet (20 mg total) by  mouth daily. For high cholesterol (Patient taking differently: Take 20 mg by mouth every evening. For high cholesterol)  . diltiazem (CARDIZEM CD) 180 MG 24 hr capsule Take 180 mg by mouth daily.  . divalproex (DEPAKOTE ER) 500 MG 24 hr tablet Take 3 tablets (1,500 mg total) by mouth at bedtime.  . feeding supplement, ENSURE ENLIVE, (ENSURE ENLIVE) LIQD Take 237 mLs by mouth 3 (three) times daily between meals.  Marland Kitchen FLUoxetine (PROZAC) 40 MG capsule Take 1 capsule (40 mg total) by mouth every morning. For mood control  . Fluticasone-Umeclidin-Vilant (TRELEGY ELLIPTA) 100-62.5-25 MCG/INH AEPB Inhale 1 puff into the lungs daily.  . folic acid (FOLVITE) 1 MG tablet Take 1 mg by mouth every morning.   . furosemide (LASIX) 40 MG tablet Take 40 mg by mouth daily.  Marland Kitchen gabapentin (NEURONTIN) 300 MG capsule Take 2 capsules (600 mg total) by mouth 2 (two) times daily.  Marland Kitchen guaiFENesin (MUCINEX) 600 MG 12 hr tablet Take 1 tablet (600 mg  total) by mouth 2 (two) times daily as needed for cough or to loosen phlegm.  . methocarbamol (ROBAXIN) 500 MG tablet Take 500 mg by mouth every 8 (eight) hours as needed.  . metoprolol tartrate (LOPRESSOR) 25 MG tablet Take 0.5 tablets (12.5 mg total) by mouth 2 (two) times daily.  . Multiple Vitamin (MULTIVITAMIN WITH MINERALS) TABS tablet Take 1 tablet by mouth daily. (Patient taking differently: Take 1 tablet by mouth every morning. )  . OLANZapine zydis (ZYPREXA) 10 MG disintegrating tablet Take 1 tablet (10 mg total) by mouth 2 (two) times daily.  . pantoprazole (PROTONIX) 20 MG tablet Take 20 mg by mouth every morning.   . vitamin B-12 (CYANOCOBALAMIN) 500 MCG tablet Take 1,000 mcg by mouth every morning.                   Past Medical History:  Diagnosis Date  . Acute blood loss anemia 02/07/2017  . Anxiety   . Arthritis    deg disease, bulging disk,  shoulder level  . Bipolar disorder (HCC)   . Bipolar disorder (HCC)   . CHF (congestive heart failure) (HCC)   . 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 Depression    anxiety  . Hyperlipidemia   . Hypertension   . Peptic ulcer disease    Review  . Pneumonia   . [name] (peripheral vascular disease) (HCC) 06/18/2015  . Shortness of breath     Outpatient Medications Prior to Visit  Medication Sig Dispense Refill  . acetaminophen (TYLENOL) 325 MG tablet Take 2 tablets (650 mg total) by mouth every 6 (six) hours as needed for mild pain, fever or headache (or Fever >/= 101). 12 tablet 0  . albuterol (VENTOLIN HFA) 108 (90 Base) MCG/ACT inhaler Inhale 2 puffs into the lungs every 4 (four) hours as needed for wheezing or shortness of breath. 6.7 g 3  . aspirin EC 81 MG tablet Take 1 tablet (81 mg total) by mouth daily with breakfast. 30 tablet 4  . atorvastatin (LIPITOR) 20 MG tablet Take 1 tablet (20 mg total) by mouth daily. For high cholesterol (Patient taking differently: Take 20 mg by mouth every evening. For high cholesterol) 30  tablet 0  . diltiazem (CARDIZEM CD) 180 MG 24 hr capsule Take 180 mg by mouth daily.    . divalproex (DEPAKOTE ER) 500 MG 24 hr tablet Take 3 tablets (1,500 mg total) by mouth at bedtime. 90 tablet 2  . FLUoxetine (PROZAC) 40 MG  capsule Take 1 capsule (40 mg total) by mouth every morning. For mood control 30 capsule 2  . folic acid (FOLVITE) 1 MG tablet Take 1 mg by mouth every morning.     . furosemide (LASIX) 40 MG tablet Take 40 mg by mouth daily.    Marland Kitchen gabapentin (NEURONTIN) 300 MG capsule Take 2 capsules (600 mg total) by mouth 2 (two) times daily. 120 capsule 2  . ipratropium-albuterol (DUONEB) 0.5-2.5 (3) MG/3ML SOLN Take 3 mLs by nebulization 2 (two) times daily. 360 mL 0  . Multiple Vitamin (MULTIVITAMIN WITH MINERALS) TABS tablet Take 1 tablet by mouth daily. (Patient taking differently: Take 1 tablet by mouth every morning. ) 30 tablet 0  . OLANZapine zydis (ZYPREXA) 10 MG disintegrating tablet Take 1 tablet (10 mg total) by mouth 2 (two) times daily. 60 tablet 2  . pantoprazole (PROTONIX) 20 MG tablet Take 20 mg by mouth every morning.     . vitamin B-12 (CYANOCOBALAMIN) 500 MCG tablet Take 1,000 mcg by mouth every morning.     . feeding supplement, ENSURE ENLIVE, (ENSURE ENLIVE) LIQD Take 237 mLs by mouth 3 (three) times daily between meals. (Patient not taking: Reported on 03/29/2020) 2370 mL 4  . guaiFENesin (MUCINEX) 600 MG 12 hr tablet Take 1 tablet (600 mg total) by mouth 2 (two) times daily as needed for cough or to loosen phlegm. (Patient not taking: Reported on 03/29/2020) 20 tablet 0  . LORazepam (ATIVAN) 1 MG tablet Take 1 tablet (1 mg total) by mouth 2 (two) times daily. (Patient not taking: Reported on 03/29/2020) 60 tablet 2  . methocarbamol (ROBAXIN) 500 MG tablet Take 500 mg by mouth every 8 (eight) hours as needed.    . metoprolol tartrate (LOPRESSOR) 25 MG tablet Take 0.5 tablets (12.5 mg total) by mouth 2 (two) times daily. 30 tablet 2  . thiamine 100 MG tablet Take 1 tablet  (100 mg total) by mouth daily. (Patient not taking: Reported on 03/29/2020) 30 tablet 1      Objective:        04/26/2020        181   03/29/20 170 lb (77.1 kg)  01/16/20 143 lb 4.8 oz (65 kg)  01/10/20 143 lb 11.8 oz (65.2 kg)      Chronically ill wm >> stated age    Vital signs reviewed  04/26/2020  - Note at rest 02 sats  95% on 3lpm pulsed     HEENT : pt wearing mask not removed for exam due to covid -19 concerns.    NECK :  without JVD/Nodes/TM/ nl carotid upstrokes bilaterally   LUNGS: no acc muscle use,  Mod barrel  contour chest wall with bilateral  Distant bs s audible wheeze and  without cough on insp or exp maneuvers and mod  Hyperresonant  to  percussion bilaterally     CV:  RRR  no s3 or murmur or increase in P2, and no edema   ABD:  soft and nontender with pos mid insp Hoover's  in the supine position. No bruits or organomegaly appreciated, bowel sounds nl  MS:     ext warm without deformities, calf tenderness, cyanosis or clubbing No obvious joint restrictions   SKIN: warm and dry without lesions    NEURO:  alert, approp, nl sensorium with  no motor or cerebellar deficits apparent. Restring tremor s rigidity         I personally reviewed images and agree with radiology impression as follows:  CXR:   04/04/20 pa and lat COPD and scarring. No acute infiltrate. Interval clearing of bilateral airspace disease        Assessment

## 2020-04-26 NOTE — Assessment & Plan Note (Addendum)
Quit smoking 2020 PFT 10/09/16 >> FEV1 1.84 (49%), FEV1% 48, TLC 7.39 (105%), DLCO 36% A1AT 10/09/16 >> 215, MM Spirometry 02/02/17 >> FEV1 2.00 (55%), FEV1% 54 - 03/29/2020  rec trelegy x 4 week trial of samples  > improved 04/26/2020      Group D in terms of symptom/risk and laba/lama/ICS  therefore appropriate rx at this point >>>  Improved on trelegy > no change rx   - The proper method of use, as well as anticipated side effects, of a metered-dose inhaler are discussed and demonstrated to the patient. Improved effectiveness after extensive coaching during this visit to a level of approximately 95 % from a baseline of 75 % (short Ti)

## 2020-04-26 NOTE — Assessment & Plan Note (Signed)
HC03 level  01/16/20  = 32  -  03/29/2020  Walked slow pace on 5lpm POC with sats starting at 92 and dropping to 82%   Again advised:  Make sure you check your oxygen saturations at highest level of activity to be sure it stays over 90% and adjust upward to maintain this level if needed but remember to turn it back to previous settings when you stop (to conserve your supply).  If can't keep over 90% needs to walk slower or change to a constant flow           Each maintenance medication was reviewed in detail including emphasizing most importantly the difference between maintenance and prns and under what circumstances the prns are to be triggered using an action plan format where appropriate.  Total time for H and P, chart review, counseling, teaching device and generating customized AVS unique to this office visit / charting = 

## 2020-04-26 NOTE — Patient Instructions (Addendum)
Plan A = Automatic = Always=    trelegy is first thing in am  One click and two good drags   Plan B = Backup (to supplement plan A, not to replace it) Only use your albuterol (Ventolin)inhaler as a rescue medication to be used if you can't catch your breath by resting or doing a relaxed purse lip breathing pattern.  - The less you use it, the better it will work when you need it. - Ok to use the inhaler up to 2 puffs  every 4 hours if you must but call for appointment if use goes up over your usual need - Don't leave home without it !!  (think of it like the spare tire for your car)   Plan C = Crisis (instead of Plan B but only if Plan B stops working) - only use your albuterol nebulizer if you first try Plan B and it fails to help > ok to use the nebulizer up to every 4 hours but if start needing it regularly call for immediate appointment check your oxygen saturations at highest level of activity to be sure it stays over 90% and adjust upward to maintain this level Make sure you  if needed but remember to turn it back to previous settings when you stop (to conserve your supply).     Please schedule a follow up visit in 3 months but call sooner if needed

## 2020-04-29 ENCOUNTER — Telehealth (HOSPITAL_COMMUNITY): Payer: Medicare Other | Admitting: Psychiatry

## 2020-05-01 ENCOUNTER — Encounter (HOSPITAL_COMMUNITY): Payer: Self-pay | Admitting: Psychiatry

## 2020-05-01 ENCOUNTER — Other Ambulatory Visit: Payer: Self-pay

## 2020-05-01 ENCOUNTER — Telehealth (INDEPENDENT_AMBULATORY_CARE_PROVIDER_SITE_OTHER): Payer: Medicare Other | Admitting: Psychiatry

## 2020-05-01 ENCOUNTER — Telehealth (HOSPITAL_COMMUNITY): Payer: Medicare Other | Admitting: Psychiatry

## 2020-05-01 DIAGNOSIS — F332 Major depressive disorder, recurrent severe without psychotic features: Secondary | ICD-10-CM

## 2020-05-01 DIAGNOSIS — F3162 Bipolar disorder, current episode mixed, moderate: Secondary | ICD-10-CM | POA: Diagnosis not present

## 2020-05-01 MED ORDER — GABAPENTIN 300 MG PO CAPS: 600.0000 mg | ORAL_CAPSULE | Freq: Two times a day (BID) | ORAL | 2 refills | Status: DC

## 2020-05-01 MED ORDER — FLUOXETINE HCL 40 MG PO CAPS: 40.0000 mg | ORAL_CAPSULE | Freq: Every morning | ORAL | 2 refills | Status: DC

## 2020-05-01 MED ORDER — LORAZEPAM 1 MG PO TABS: 1.0000 mg | ORAL_TABLET | Freq: Two times a day (BID) | ORAL | 2 refills | Status: DC

## 2020-05-01 MED ORDER — DIVALPROEX SODIUM ER 500 MG PO TB24: 1500.0000 mg | ORAL_TABLET | Freq: Every day | ORAL | 2 refills | Status: DC

## 2020-05-01 MED ORDER — OLANZAPINE 10 MG PO TBDP: 10.0000 mg | ORAL_TABLET | Freq: Two times a day (BID) | ORAL | 2 refills | Status: DC

## 2020-05-01 NOTE — Progress Notes (Signed)
Virtual Visit via Telephone Note  I connected with Jeffrey [name] on 05/01/20 at  9:40 AM EDT by telephone and verified that I am speaking with the correct person using two identifiers.   I discussed the limitations, risks, security and privacy concerns of performing an evaluation and management service by telephone and the availability of in person appointments. I also discussed with the patient that there may be a patient responsible charge related to this service. The patient expressed understanding and agreed to proceed.     I discussed the assessment and treatment plan with the patient. The patient was provided an opportunity to ask questions and all were answered. The patient agreed with the plan and demonstrated an understanding of the instructions.   The patient was advised to call back or seek an in-person evaluation if the symptoms worsen or if the condition fails to improve as anticipated.  I provided 15 minutes of non-face-to-face time during this encounter. Location: Provider home, patient home  Diannia Ruder, MD  [facility] MD/PA/NP OP Progress Note  05/01/2020 9:57 AM Jeffrey [name]  MRN:  [id number]  Chief Complaint:  Chief Complaint    Anxiety; Depression; Manic Behavior; Follow-up     HPI: This patient is a 59 year old white male who is currently living with his ex-wife in [place].  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  Patient returns for follow-up after 2 months.  He states that in general he is doing okay.  He still oxygen and cannot move very far without getting short of breath.  He does have a portable tank that he can use to go out.  He states that his primary physician put him on Robaxin and he takes this at night and it helps him sleep.  He states in general his mood is pretty good and he denies any thoughts of self-harm or suicidal ideation.  He denies being angry or agitated or having hallucinations or racing thoughts.  He actually  sounds pleasant polite and oriented today.  He is compliant with all of his medications.  To his credit he has quit smoking and he has also received his coronavirus vaccine. Visit Diagnosis:    ICD-10-CM   1. Moderate mixed bipolar I disorder (HCC)  F31.62   2. 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: external/internal anal canal hemorrhoids, unable to intubate TI,  segemental biopsies unremarkable    COLONOSCOPY N/A 04/11/2017   Procedure: COLONOSCOPY;  Surgeon: West BaliFields, Sandi L, MD;  Location: [facility];  Service: Endoscopy;  Laterality: N/A;   COLONOSCOPY WITH PROPOFOL N/A 03/31/2017   Procedure: COLONOSCOPY WITH PROPOFOL;  Surgeon: Corbin Adeourk, Robert M, MD;  Location: [facility];  Service: Endoscopy;  Laterality: N/A;  1:45pm   ESOPHAGOGASTRODUODENOSCOPY  03/14/2007   ZOX:[id number]:Nonerosive antral gastritis with bulbar duodenitis/paucity to postbulbar duodenal folds and biopsy were benign with no evidence of  villous atrophy.   ESOPHAGOGASTRODUODENOSCOPY (EGD) WITH PROPOFOL N/A 02/09/2017   Procedure: ESOPHAGOGASTRODUODENOSCOPY (EGD) WITH PROPOFOL;  Surgeon: West BaliSandi L Fields, MD;  [facility]: [facility];  Service: Endoscopy;  Laterality: N/A;   GIVENS CAPSULE STUDY N/A 04/08/2017   Procedure: GIVENS CAPSULE STUDY;  Surgeon: Corbin Adeourk, Robert M, MD;  [facility]: [facility];  Service: Endoscopy;  Laterality: N/A;   HAND SURGERY     left, secondary to self-inflicted laceration   HEMORRHOID SURGERY N/A 04/14/2017   Procedure: HEMORRHOIDECTOMY;  Surgeon: Franky MachoJenkins, Mark, MD;  Location: [facility];  Service: General;  Laterality: N/A;   SHOULDER SURGERY     right   TOE SURGERY     left great toe , amputated-lawnmover accident    Family Psychiatric History: see below  Family History:  Family History  Problem Relation Age of Onset   Breast cancer Mother        deceased   Heart disease Father    Depression Daughter    Anxiety disorder Daughter    Anxiety disorder Son    Depression Son    Asthma Brother    Heart attack Maternal Aunt    Heart attack Maternal Uncle    Heart attack Paternal Aunt    Heart attack Paternal Uncle    Heart attack Maternal Grandmother    Heart attack Maternal Grandfather    Emphysema Maternal Grandfather    Heart attack Paternal Grandmother    Heart attack Paternal Grandfather    Colon cancer Neg Hx    Liver disease Neg Hx     Social History:  Social History   Socioeconomic History   Marital status: Divorced    Spouse name: Not on file   Number of children: 2   Years of education: Not on file   Highest education level: Not on file  Occupational History   Occupation: Disabled  Tobacco Use   Smoking status: Former Smoker    Packs/day: 1.00    Years: 40.00    Pack years: 40.00    Types: Cigarettes    Start date: 08/04/1971    Quit date: 09/30/2019    Years since quitting: 0.5   Smokeless tobacco: Former NeurosurgeonUser    Quit date: 09/30/2019    Tobacco comment: peak rate of 2.5ppd, 1/2ppd on 11/27/2016 -- 6 cigarettes / day 01/25/18  Substance and Sexual Activity   Alcohol use: No    Alcohol/week: 0.0 standard drinks   Drug use: Not Currently    Types: Marijuana    Comment: most days    Sexual activity: Not Currently  Other Topics Concern   Not on file  Social History Narrative   Originally from [place]. [place] in [place] [facility] [place] [facility] 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 Questionable asbestos exposure. Does have significant exposure to fumes. No mold exposure. No bird exposure. No pets currently.  [place] lives alone in [place].   Social Determinants  of Health   Financial Resource Strain:    Difficulty of Paying Living Expenses:   Food Insecurity:    Worried About Charity fundraiser in the Last Year:    Arboriculturist in the Last Year:   Transportation Needs:    Film/video editor (Medical):    Lack of Transportation (Non-Medical):   Physical Activity:    Days of Exercise per Week:    Minutes of Exercise per Session:   Stress:    Feeling of Stress :   Social Connections:    Frequency of Communication with Friends and Family:    Frequency of Social Gatherings with Friends and Family:    Attends Religious Services:    Active Member of Clubs or Organizations:    Attends Archivist Meetings:    Marital Status:     Allergies:  Allergies  Allergen Reactions   Augmentin [Amoxicillin-Pot Clavulanate] Rash and Other (See Comments)    Tolerated Zosyn 12/27/2019 Has patient had a PCN reaction causing immediate rash, facial/tongue/throat swelling, SOB or lightheadedness with hypotension: No Has patient had a PCN reaction causing severe rash involving mucus membranes or skin necrosis: No Has patient had a PCN reaction that required hospitalization No Has patient had a PCN reaction occurring within the last 10 years: Yes If  all of the above answers are "NO", then may proceed with Cephalosporin use.   Ace Inhibitors Hives    Metabolic Disorder Labs: No results found for: HGBA1C, MPG No results found for: PROLACTIN Lab Results  Component Value Date   TRIG 84 12/24/2019   Lab Results  Component Value Date   TSH 1.552 05/01/2019   TSH 0.787 12/22/2018    Therapeutic Level Labs: No results found for: LITHIUM Lab Results  Component Value Date   VALPROATE 17 (L) 01/16/2020   VALPROATE 31 (L) 12/24/2019   No components found for:  CBMZ  Current Medications: Current Outpatient Medications  Medication Sig Dispense Refill   acetaminophen (TYLENOL) 325 MG tablet Take 2 tablets (650 mg total) by mouth every 6 (six) hours as needed for mild pain, fever or headache (or Fever >/= 101). 12 tablet 0   albuterol (PROVENTIL) (2.5 MG/3ML) 0.083% nebulizer solution Take 3 mLs (2.5 mg total) by nebulization every 4 (four) hours as needed for wheezing or shortness of breath. 75 mL 12   albuterol (VENTOLIN HFA) 108 (90 Base) MCG/ACT inhaler Inhale 2 puffs into the lungs every 4 (four) hours as needed for wheezing or shortness of breath. 6.7 g 3   aspirin EC 81 MG tablet Take 1 tablet (81 mg total) by mouth daily with breakfast. 30 tablet 4   atorvastatin (LIPITOR) 20 MG tablet Take 1 tablet (20 mg total) by mouth daily. For high cholesterol (Patient taking differently: Take 20 mg by mouth every evening. For high cholesterol) 30 tablet 0   diltiazem (CARDIZEM CD) 180 MG 24 hr capsule Take 180 mg by mouth daily.     divalproex (DEPAKOTE ER) 500 MG 24 hr tablet Take 3 tablets (1,500 mg total) by mouth at bedtime. 90 tablet 2   feeding supplement, ENSURE ENLIVE, (ENSURE ENLIVE) LIQD Take 237 mLs by mouth 3 (three) times daily between meals. 2370 mL 4   FLUoxetine (PROZAC) 40 MG capsule Take 1 capsule (40 mg total) by mouth every morning. For mood control 30 capsule 2   Fluticasone-Umeclidin-Vilant (TRELEGY ELLIPTA)  100-62.5-25 MCG/INH AEPB Inhale 1 puff into the lungs daily. 14 each 0  folic acid (FOLVITE) 1 MG tablet Take 1 mg by mouth every morning.      furosemide (LASIX) 40 MG tablet Take 40 mg by mouth daily.     gabapentin (NEURONTIN) 300 MG capsule Take 2 capsules (600 mg total) by mouth 2 (two) times daily. 120 capsule 2   guaiFENesin (MUCINEX) 600 MG 12 hr tablet Take 1 tablet (600 mg total) by mouth 2 (two) times daily as needed for cough or to loosen phlegm. 20 tablet 0   LORazepam (ATIVAN) 1 MG tablet Take 1 tablet (1 mg total) by mouth 2 (two) times daily. 60 tablet 2   methocarbamol (ROBAXIN) 500 MG tablet Take 500 mg by mouth every 8 (eight) hours as needed.     metoprolol tartrate (LOPRESSOR) 25 MG tablet Take 0.5 tablets (12.5 mg total) by mouth 2 (two) times daily. 30 tablet 2   Multiple Vitamin (MULTIVITAMIN WITH MINERALS) TABS tablet Take 1 tablet by mouth daily. (Patient taking differently: Take 1 tablet by mouth every morning. ) 30 tablet 0   OLANZapine zydis (ZYPREXA) 10 MG disintegrating tablet Take 1 tablet (10 mg total) by mouth 2 (two) times daily. 60 tablet 2   pantoprazole (PROTONIX) 20 MG tablet Take 20 mg by mouth every morning.      vitamin B-12 (CYANOCOBALAMIN) 500 MCG tablet Take 1,000 mcg by mouth every morning.      No current facility-administered medications for this visit.     Musculoskeletal: Strength & Muscle Tone: within normal limits Gait & Station: normal Patient leans: N/A  Psychiatric Specialty Exam: Review of Systems  Constitutional: Positive for fatigue.  Respiratory: Positive for shortness of breath.   All other systems reviewed and are negative.   There were no vitals taken for this visit.There is no height or weight on file to calculate BMI.  General Appearance: NA  Eye Contact:  NA  Speech:  Clear and Coherent  Volume:  Normal  Mood:  Euthymic  Affect:  NA  Thought Process:  Goal Directed  Orientation:  Full (Time, Place, and  Person)  Thought Content: WDL   Suicidal Thoughts:  No  Homicidal Thoughts:  No  Memory:  Immediate;   Good Recent;   Good Remote;   NA  Judgement:  Fair  Insight:  Fair  Psychomotor Activity:  Decreased  Concentration:  Concentration: Fair and Attention Span: Fair  Recall:  [employer] of Knowledge: Fair  Language: Good  Akathisia:  No  Handed:  Right  AIMS (if indicated): not done  Assets:  Communication Skills Desire for Improvement Resilience Social Support Talents/Skills  ADL's:  Intact  Cognition: WNL  Sleep:  Good   Screenings: AIMS     [facility]  AIMS Total Score  0    AUDIT     [facility]  Alcohol Use Disorder Identification Test Final Score (AUDIT)  18       Assessment and Plan: This patient is a 59 year old male with a history of bipolar disorder and polysubstance abuse.  He is also had severe COPD and is on continuous oxygen.  His medical and psychiatric problems seem to be stable at present.  He will continue Depakote ER 1500 mg at bedtime for mood stabilization, Prozac 40 mg daily for depression, gabapentin 300 mg 3 times daily for anxiety olanzapine 10 mg twice daily for mood stabilization and Ativan 1 mg twice  daily for anxiety.  He will return to see me in 3 months   Diannia Ruder, MD 05/01/2020, 9:57 AM

## 2020-05-13 ENCOUNTER — Other Ambulatory Visit (HOSPITAL_COMMUNITY): Payer: Self-pay | Admitting: Psychiatry

## 2020-05-13 MED ORDER — OLANZAPINE 10 MG PO TABS: 10.0000 mg | ORAL_TABLET | Freq: Two times a day (BID) | ORAL | 2 refills | Status: DC

## 2020-07-17 ENCOUNTER — Telehealth (HOSPITAL_COMMUNITY): Payer: Self-pay | Admitting: Psychiatry

## 2020-07-17 NOTE — Telephone Encounter (Signed)
Called to schedule follow up appt, left detailed voicemail 

## 2020-07-31 ENCOUNTER — Ambulatory Visit: Payer: Medicare Other | Admitting: Internal Medicine

## 2020-07-31 ENCOUNTER — Other Ambulatory Visit: Payer: Self-pay

## 2020-07-31 ENCOUNTER — Encounter: Payer: Self-pay | Admitting: Internal Medicine

## 2020-07-31 DIAGNOSIS — F17210 Nicotine dependence, cigarettes, uncomplicated: Secondary | ICD-10-CM

## 2020-07-31 DIAGNOSIS — J449 Chronic obstructive pulmonary disease, unspecified: Secondary | ICD-10-CM | POA: Diagnosis not present

## 2020-07-31 DIAGNOSIS — J9611 Chronic respiratory failure with hypoxia: Secondary | ICD-10-CM | POA: Diagnosis not present

## 2020-07-31 DIAGNOSIS — J9612 Chronic respiratory failure with hypercapnia: Secondary | ICD-10-CM

## 2020-07-31 DIAGNOSIS — J310 Chronic rhinitis: Secondary | ICD-10-CM | POA: Diagnosis not present

## 2020-07-31 MED ORDER — PREDNISONE 10 MG PO TABS: ORAL_TABLET | ORAL | 0 refills | Status: DC

## 2020-07-31 NOTE — Progress Notes (Signed)
Jeffrey [name], male    DOB: 1961-01-19 .   MRN: [id number]   Brief patient profile:  59 yowm MM  Active smoker  at 5lpm 24/7 and not much better since self referred to [facility] clinic 03/29/2020 p prior rx by [facility]/ [facility]   PFT 10/09/16 >> FEV1 1.84 (49%), FEV1% 48, TLC 7.39 (105%), DLCO 36% A1AT 10/09/16 >> 215, MM Spirometry 02/02/17 >> FEV1 2.00 (55%), FEV1% 54     Admission date:  12/24/2019   Discharge Date:  01/10/2020   Primary MD  Gareth Morgan, MD   Discharge Diagnosis  Encephalopathy [G93.40] History of ETT [Z92.89] Acute on chronic respiratory failure with hypercapnia (HCC) [J96.22] Acute respiratory failure with hypoxia and hypercapnia (HCC) [J96.01, J96.02]   Active Problems:   Bipolar disorder with severe depression (HCC)   Essential hypertension   COPD with acute exacerbation (HCC)   Acute on chronic respiratory failure with hypercapnia (HCC)   Acute on chronic respiratory failure with hypoxia and hypercapnia (HCC)   Lobar pneumonia (HCC)   Acute metabolic encephalopathy   Acute on chronic diastolic CHF (congestive heart failure) (HCC)   Aspiration pneumonitis (HCC)        HPI  from the history and physical done on the day of admission:    Chief Complaint:AMS,difficulty breathing  HPI:  Per chart review patient was brought into the ED reports of difficulty breathing x 3 days, with confusion, and in the ED patient was tremulous.  Patient's ex-wife who now lives with patient tells me patient has not had any drink or use any drugs inthe past year. She reports increasing difficulty breathing and productive cough. No vomiting no loose stools. She said the past 3 days when patient was confused tells when he said he wanted someone to shoot him. She otherwise denies suicidal ideations. She also reports patient has gotten tired of using oxygen, his bronchodilators, and lying around as normally he likes to be active.  Admission11/19- 11/84for  acute hypercapnic respiratory failure secondary to COPD exacerbation and component of CHF.RequiredPrecedex drip for agitation.Discharged home on 3 to 4 L nasal cannula which was his baseline.  ED Course:Tachypneic, O2 sats 91 to 95% on nasal cannula 4 L. But with worsening mental status, patient not responding, and thenagitated, pullingat lines and tubes, decision was made to intubate patient as patient would not tolerate BiPAP. And sedated with propofol. ABG showed pH of 7.3, marked elevated PCO2 of 107. PaO2 <31. Covid test negative. Portable chest x-ray shows left greater than right base airspace disease, aspiration orinfection favored. IV vancomycin and cefepime started for HCAP.     Hospital Course:    Brief Narrative: 59 year old male with history of COPD is chronically on 3 to 4 L of oxygen, admitted with worsening shortness of breath. Felt to be related to COPD exacerbation. He was intubated in the emergency room and admitted to the ICU. Fortunately, he was able to extubate on 12/26/19. Hospital course further complicated by development of acute metabolic encephalopathy. Etiology is unclear, but may be related to high-dose steroids. Required Precedex infusion. Steroids have been tapered off ---Mental status overall much improved,  -Patient is cooperative, talkative and mostly appropriate -Was able to wean off Precedex drip on 01/04/2020-  -Patient is medically stable for discharge - Transfer to SNF rehab   Assessment & Plan: 1)Acute on chronic respiratory failure with hypoxia-ABG suggested mild hypercapnia as well----- secondary to COPD exacerbation and aspiration pneumonia. Patient was intubated on admission (12/24/19), but was able  to extubate 12/26/19. Overall respiratory status appears to be stable. 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 -No longer requiring IV medications for sedation -Patient is cooperative, talkative and mostly appropriate -Was able to wean off Precedex drip on 01/04/2020-  -Patient with history of EtOH abuse, denies recent EtOH use -As per daughter Ms. 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 twice daily -May use as needed ODT Zyprexa --Occasional episodes of restlessness, patient is usually redirectable -- may need further adjustments of antipsychotic/psychiatric medications to optimize mood and behavior  3)Acute COPD exacerbation secondary to presumed aspiration pneumonia --- extubated 12/26/2019 , c =-ABG was suggestive of hypoxia and hypercapnia, completed antibiotics already, prednisone discontinued 01/04/2020  4)Bipolar Disorder- Continue home psychotropic medications including olanzapine and Depakote, also continue Prozac, lorazepam and trazodone,--- additional psych medication adjustments as above ----Mental status overall much improved,  -Not requiring IV medications for sedation -Patient is cooperative, talkative and mostly appropriate ----Occasional episodes of restlessness, patient is usually redirectable -- may need further adjustments of antipsychotic/psychiatric medications to optimize mood and behavior  5)Hypertension---.c/nmetoprolol to 12.5mg  twice daily,   6)Dysphonia/Dysphagia--- voice is a bit louder, patient voice is  soft/muffled apparently this is not new (initially worsened post  intubation) -Speech pathology eval appreciated recommends-Dysphagia 3 (mechanical soft); liquids have been changed to thin liquids as of 01/05/2020 from nectar-thick liquid  7)[name]--stable, continue atorvastatin and aspirin  8)Social/Ethics-- Discussed with Daughter [name] -[phone] [facility]), Pt is a Full code  9)Tobacco Abuse----smoking cessation        History of Present Illness  03/29/2020  Pulmonary/ 1st office eval/Aditya Nastasi  Chief Complaint  Patient presents with   Follow-up    sob with exertion.  sob with sitting still talking.  dry cough from time to time.  Dyspnea: Tol  Very little activity, MB maybe 371ft hasn't done x sev weeks prior to OV  Stop half way to catch breath  Cough: not much now/ minimal white mucus   Sleep: bed is flat / two pillows  SABA use: twice daily / neb 2x daily  02  5lpm at home with high 90's and then walk on 3lpm POC  rec Plan A = Automatic = Always=    Trelegy one click each am - take two good deep drags and out thru nose  Plan B = Backup (to supplement plan A, not to replace it) Only use your albuterol inhaler as a rescue medication to be used if you can't catch your breath by resting or doing a relaxed purse lip breathing pattern.  - The less you use it, the better it will work when you need it. - Ok to use the inhaler up to 2 puffs  every 4 hours if you must but call for appointment if use goes up over your usual need - Don't leave home without it !!  (think of it like the spare tire for your car)  Plan C = Crisis (instead of Plan B but only if Plan B stops working) -  only use your albuterol nebulizer if you first try Plan B and it fails to help > ok to use the nebulizer up to every 4 hours but if start needing it regularly call for immediate appointment Make sure you check your oxygen saturations at highest level of activity to be sure it stays over 90% (low 90s is perfect) and adjust upward to maintain this level if needed but remember to  turn it back to previous settings when you stop (to conserve your supply).  Please schedule a follow up office visit in 4 weeks, sooner if needed  with all respiratory  medications /inhalers/ solutions in hand so we can verify exactly what you are taking. This includes all medications from all doctors and over the counters Add cxr needed   Also added best fit for amb 02 order     04/26/2020  f/u ov/Adalis Gatti re: GOLD II/III 02 on trelegy daily  S/p moderna Chief Complaint  Patient presents with   Follow-up    Breathing has improved some since the last visit. His voice is back to normal.   Dyspnea:  Shower is easier but not the mb and desats on 3lpm POC but not adjusting  Cough: very seldom  Sleeping: bed is flat / 2 pillows SABA use: once a day 02: 3lpm conc/ POC  3lpm walking but it will go to 5 and never checks sats  rec Plan A = Automatic = Always=    trelegy is first thing in am  One click and two good drags Plan B = Backup (to supplement plan A, not to replace it) Only use your albuterol (Ventolin)inhaler as a rescue medication  Plan C = Crisis (instead of Plan B but only if Plan B stops working) - only use your albuterol nebulizer if you first try Plan B and it fails to help    07/31/2020  f/u ov/[place] office/Tyquez Hollibaugh re: GOLD II/III 02 on trelegy daily Chief Complaint  Patient presents with   Follow-up   Dyspnea: MMRC3 = can't walk 100 yards even at a slow pace at a flat grade s stopping due to sob Cough: some rattling thick mucus/  Sleeping: bed is flat / 3 pillows  SABA use: still sev times per day hfa but not neb  02: 4lpm 24/7    No obvious day to day or daytime variability or assoc excess/ purulent sputum or mucus plugs or hemoptysis or cp or chest tightness, subjective wheeze or overt sinus or hb symptoms.   Sleeping  without nocturnal  or early am exacerbation  of respiratory  c/o's or need for noct saba. Also denies any obvious fluctuation of symptoms with weather or  environmental changes or other aggravating or alleviating factors except as outlined above   No unusual exposure hx or h/o childhood pna/ asthma or knowledge of premature birth.  Current Allergies, Complete Past Medical History, Past Surgical History, Family History, and Social History were reviewed in [employer] record.  ROS  The following are not active complaints unless bolded Hoarseness, sore throat, dysphagia, dental problems, itching, sneezing,  nasal congestion or discharge of excess mucus or purulent secretions, ear ache,   fever, chills, sweats, unintended wt loss or wt gain, classically pleuritic or exertional cp,  orthopnea pnd or arm/hand swelling  or leg swelling, presyncope, palpitations, abdominal pain, anorexia, nausea, vomiting, diarrhea  or change in bowel habits or change in bladder habits, change in stools or change in urine, dysuria, hematuria,  rash, arthralgias,  visual complaints, headache, numbness, weakness or ataxia or problems with walking or coordination,  change in mood or  memory.        Current Meds  Medication Sig   acetaminophen (TYLENOL) 325 MG tablet Take 2 tablets (650 mg total) by mouth every 6 (six) hours as needed for mild pain, fever or headache (or Fever >/= 101).   albuterol (PROVENTIL) (2.5 MG/3ML) 0.083% nebulizer solution Take 3 mLs (2.5 mg total) by nebulization every 4 (four) hours as needed for wheezing or shortness of breath.   albuterol (VENTOLIN HFA) 108 (90 Base) MCG/ACT inhaler Inhale 2 puffs into the lungs every 4 (four) hours as needed for wheezing or shortness of breath.   aspirin EC 81 MG tablet Take 1 tablet (81 mg total) by mouth daily with breakfast.   atorvastatin (LIPITOR) 20 MG tablet Take 1 tablet (20 mg total) by mouth daily. For high cholesterol (Patient taking differently: Take 20 mg by mouth every evening. For high cholesterol)   diltiazem (CARDIZEM CD) 180 MG 24 hr capsule Take 180 mg by mouth daily.    divalproex (DEPAKOTE ER) 500 MG 24 hr tablet Take 3 tablets (1,500 mg total) by mouth at bedtime.   feeding supplement, ENSURE ENLIVE, (ENSURE ENLIVE) LIQD Take 237 mLs by mouth 3 (three) times daily between meals.   FLUoxetine (PROZAC) 40 MG capsule Take 1 capsule (40 mg total) by mouth every morning. For mood control   Fluticasone-Umeclidin-Vilant (TRELEGY ELLIPTA) 100-62.5-25 MCG/INH AEPB Inhale 1 puff into the lungs daily.   folic acid (FOLVITE) 1 MG tablet Take 1 mg by mouth every morning.    furosemide (LASIX) 40 MG tablet Take 40 mg by mouth daily.   gabapentin (NEURONTIN) 300 MG capsule Take 2 capsules (600 mg total) by mouth 2 (two) times daily.   guaiFENesin (MUCINEX) 600 MG 12 hr tablet Take 1 tablet (600 mg total) by mouth 2 (two) times daily as needed for cough or to loosen phlegm.   LORazepam (ATIVAN) 1 MG tablet Take 1 tablet (1 mg total) by mouth 2 (two) times daily.   methocarbamol (ROBAXIN) 500 MG tablet Take 500 mg by mouth every 8 (eight) hours as needed.   metoprolol tartrate (LOPRESSOR) 25 MG tablet Take 0.5 tablets (12.5 mg total) by mouth 2 (two) times daily.   Multiple Vitamin (MULTIVITAMIN WITH MINERALS) TABS tablet Take 1 tablet by mouth daily. (Patient taking differently: Take 1 tablet by mouth every morning. )   OLANZapine (ZYPREXA) 10 MG tablet Take 1 tablet (10 mg total) by mouth 2 (two) times daily.   pantoprazole (PROTONIX) 20 MG tablet Take 20 mg by mouth every morning.    predniSONE (DELTASONE) 10 MG tablet Take  4 each am x 2 days,   2 each am x 2 days,  1 each am x 2 days and stop   vitamin B-12 (CYANOCOBALAMIN) 500 MCG tablet Take 1,000 mcg by mouth every morning.                    Past Medical History:  Diagnosis Date   Acute blood loss anemia 02/07/2017   Anxiety    Arthritis    deg disease, bulging disk,  shoulder level   Bipolar disorder (HCC)    Bipolar disorder (HCC)    CHF (congestive heart failure) (HCC)    COPD,  severe (HCC) 10/09/2016   Depression    anxiety   Hyperlipidemia    Hypertension    Peptic ulcer disease  Review   Pneumonia    [name] (peripheral vascular disease) (HCC) 06/18/2015   Shortness of breath                         Objective:       04/26/2020        181   03/29/20 170 lb (77.1 kg)  01/16/20 143 lb 4.8 oz (65 kg)  01/10/20 143 lb 11.8 oz (65.2 kg)       Vital signs reviewed  07/31/2020  - Note at rest 02 sats  94% on 4lpm          HEENT : pt wearing mask not removed for exam due to covid -19 concerns.    NECK :  without JVD/Nodes/TM/ nl carotid upstrokes bilaterally   LUNGS: no acc muscle use,  Mod barrel  contour chest wall with bilateral  Distant bs s audible wheeze and  without cough on insp or exp maneuvers and mod  Hyperresonant  to  percussion bilaterally     CV:  RRR  no s3 or murmur or increase in P2, and no edema   ABD:  soft and nontender with pos mid insp Hoover's  in the supine position. No bruits or organomegaly appreciated, bowel sounds nl  MS:     ext warm without deformities, calf tenderness, cyanosis or clubbing No obvious joint restrictions   SKIN: warm and dry without lesions    NEURO:  alert, approp, nl sensorium with  Resting tremor s rigidity            I personally reviewed images and agree with radiology impression as follows:  CXR:   04/04/20  COPD and scarring. No acute infiltrate. Interval clearing of bilateral airspace disease.     Assessment

## 2020-07-31 NOTE — Patient Instructions (Addendum)
Prednisone 10 mg take  4 each am x 2 days,   2 each am x 2 days,  1 each am x 2 days and stop   I emphasized that nasal steroids (flonase/nasacort)  have no immediate benefit in terms of improving symptoms.  To help them reached the target tissue, the patient should use Afrin two puffs every 12 hours applied one min before using the nasal steroids.  Afrin should be stopped after no more than 5 days.  If the symptoms worsen, Afrin can be restarted after 5 days off of therapy to prevent rebound congestion from overuse of Afrin.  I also emphasized that in no way are nasal steroids a concern in terms of "addiction".   I recommend the booster of moderna when it is offered probably this fall   The key is to stop smoking completely before smoking completely stops you!   For cough congestion > mucinex dm 1200 mg every 12 hours is the maximum dose   Make sure you check your oxygen saturations at highest level of activity to be sure it stays over 90% and adjust upward to maintain this level if needed but remember to turn it back to previous settings when you stop (to conserve your supply).      Please schedule a follow up visit in 3 months but call sooner if needed    .

## 2020-08-01 ENCOUNTER — Encounter: Payer: Self-pay | Admitting: Internal Medicine

## 2020-08-01 DIAGNOSIS — J310 Chronic rhinitis: Secondary | ICD-10-CM | POA: Insufficient documentation

## 2020-08-01 NOTE — Assessment & Plan Note (Signed)
I emphasized that nasal steroids have no immediate benefit in terms of improving symptoms.  To help them reached the target tissue, the patient should use Afrin two puffs every 12 hours applied one min before using the nasal steroids.  Afrin should be stopped after no more than 5 days.  If the symptoms worsen, Afrin can be restarted after 5 days off of therapy to prevent rebound congestion from overuse of Afrin.  I also emphasized that in no way are nasal steroids a concern in terms of "addiction".     Each maintenance medication was reviewed in detail including emphasizing most importantly the difference between maintenance and prns and under what circumstances the prns are to be triggered using an action plan format where appropriate.  Total time for H and P, chart review, counseling, teaching devices (elipta, hfa/ nasal sprays) and  directly observing portions of ambulatory 02 saturation study/  and generating customized AVS unique to this office visit / charting = 31 min

## 2020-08-01 NOTE — Assessment & Plan Note (Addendum)
Counseled re importance of smoking cessation but did not meet time criteria for separate billing   °

## 2020-08-01 NOTE — Assessment & Plan Note (Signed)
HC03 level  01/16/20  = 32  -  03/29/2020  Walked slow pace on 5lpm POC with sats starting at 92 and dropping to 82%  -  07/31/2020   Walked RA  approx   100 ft  @ slow pace  stopped due to  Sob on 4lpm but sats still 93%    Advised:  Make sure you check your oxygen saturations at highest level of activity to be sure it stays over 90% and adjust upward to maintain this level if needed but remember to turn it back to previous settings when you stop (to conserve your supply).

## 2020-08-01 NOTE — Assessment & Plan Note (Addendum)
Quit smoking 2020 PFT 10/09/16 >> FEV1 1.84 (49%), FEV1% 48, TLC 7.39 (105%), DLCO 36% A1AT 10/09/16 >> 215, MM Spirometry 02/02/17 >> FEV1 2.00 (55%), FEV1% 54 - 03/29/2020  After extensive coaching inhaler device,  effectiveness =    90% with elipta, 75% with hfa > trelegy x 4 week trial of samples  > improved 04/26/2020     Since actively smoking again he is likely progressing toward more severe copd and remains Group B in terms of symptoms and risk > continue trelegy  / rx pred x 6 days x cycle And prn saba  I spent extra time with pt today reviewing appropriate use of albuterol for prn use on exertion with the following points: 1) saba is for relief of sob that does not improve by walking a slower pace or resting but rather if the pt does not improve after trying this first. 2) If the pt is convinced, as many are, that saba helps recover from activity faster then it's easy to tell if this is the case by re-challenging : ie stop, take the inhaler, then p 5 minutes try the exact same activity (intensity of workload) that just caused the symptoms and see if they are substantially diminished or not after saba 3) if there is an activity that reproducibly causes the symptoms, try the saba 15 min before the activity on alternate days   If in fact the saba really does help, then fine to continue to use it prn but advised may need to look closer at the maintenance regimen being used to achieve better control of airways disease with exertion.

## 2020-08-09 ENCOUNTER — Encounter (HOSPITAL_COMMUNITY): Payer: Self-pay | Admitting: Psychiatry

## 2020-08-09 ENCOUNTER — Other Ambulatory Visit: Payer: Self-pay

## 2020-08-09 ENCOUNTER — Telehealth (INDEPENDENT_AMBULATORY_CARE_PROVIDER_SITE_OTHER): Payer: Medicare Other | Admitting: Psychiatry

## 2020-08-09 DIAGNOSIS — F332 Major depressive disorder, recurrent severe without psychotic features: Secondary | ICD-10-CM

## 2020-08-09 DIAGNOSIS — F3162 Bipolar disorder, current episode mixed, moderate: Secondary | ICD-10-CM

## 2020-08-09 MED ORDER — LORAZEPAM 1 MG PO TABS: 1.0000 mg | ORAL_TABLET | Freq: Two times a day (BID) | ORAL | 2 refills | Status: DC

## 2020-08-09 MED ORDER — TRAZODONE HCL 50 MG PO TABS: 50.0000 mg | ORAL_TABLET | Freq: Every day | ORAL | 2 refills | Status: DC

## 2020-08-09 MED ORDER — OLANZAPINE 10 MG PO TABS: 10.0000 mg | ORAL_TABLET | Freq: Two times a day (BID) | ORAL | 2 refills | Status: DC

## 2020-08-09 MED ORDER — GABAPENTIN 300 MG PO CAPS: 600.0000 mg | ORAL_CAPSULE | Freq: Two times a day (BID) | ORAL | 2 refills | Status: DC

## 2020-08-09 MED ORDER — DIVALPROEX SODIUM ER 500 MG PO TB24: 1500.0000 mg | ORAL_TABLET | Freq: Every day | ORAL | 2 refills | Status: DC

## 2020-08-09 MED ORDER — FLUOXETINE HCL 40 MG PO CAPS: 40.0000 mg | ORAL_CAPSULE | Freq: Every morning | ORAL | 2 refills | Status: DC

## 2020-08-09 NOTE — Progress Notes (Signed)
Virtual Visit via Telephone Note  I connected with Jeffrey [name] on 08/09/20 at 10:00 AM EDT by telephone and verified that I am speaking with the correct person using two identifiers.   I discussed the limitations, risks, security and privacy concerns of performing an evaluation and management service by telephone and the availability of in person appointments. I also discussed with the patient that there may be a patient responsible charge related to this service. The patient expressed understanding and agreed to proceed.     I discussed the assessment and treatment plan with the patient. The patient was provided an opportunity to ask questions and all were answered. The patient agreed with the plan and demonstrated an understanding of the instructions.   The patient was advised to call back or seek an in-person evaluation if the symptoms worsen or if the condition fails to improve as anticipated.  I provided 15 minutes of non-face-to-face time during this encounter. Location: Provider office, patient home  Diannia Ruder, MD  [facility] MD/PA/NP OP Progress Note  08/09/2020 10:14 AM [name]  MRN:  [id number]  Chief Complaint:  Chief Complaint    Depression; Manic Behavior; Follow-up     HPI: This patient is a 59 year old white male who is currently living with his ex-wife in [place]. 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  The patient returns for follow-up after 3 months. He is still dealing with severe COPD and is on continuous oxygen. He cannot do very much because of his shortness of breath. He still is not sleeping well and asked to go back on trazodone and I think this is reasonable. In general he states that his mood is good and he denies any thoughts of self-harm or suicidal ideation. He denies agitation anger temper outbursts or hallucinations or racing thoughts. He is compliant with all of his medications and denies the use of alcohol or drugs.  He had quit smoking but is back to smoking 2 or 3 cigarettes a day. He is trying to quit. Visit Diagnosis:    ICD-10-CM   1. Moderate mixed bipolar I disorder (HCC)  F31.62   2. Major depressive disorder, recurrent, severe without psychotic features (HCC)  F33.2     Past Psychiatric History: Several hospitalizations for depression, the last one in March 2019 for suicide attempt  Past Medical History:  Past Medical History:  Diagnosis Date  . Acute blood loss anemia 02/07/2017  . Anxiety   . Arthritis    deg disease, bulging disk,  shoulder level  . Bipolar disorder (HCC)   . Bipolar disorder (HCC)   . CHF (congestive heart failure) (HCC)   . 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 Depression    anxiety  . Hyperlipidemia   . Hypertension   . Peptic ulcer disease    Review  . Pneumonia   . [name] (peripheral vascular disease) (HCC) 06/18/2015  . Shortness of breath     Past Surgical History:  Procedure Laterality Date  . BIOPSY  02/09/2017   Procedure: BIOPSY;  Surgeon: [name], MD;  [facility];  Service: Endoscopy;;  duodenal gastric  . COLONOSCOPY  03/14/2007   VQQ:VZDGLO colonoscopy and terminal ileoscopy except external hemorrhoids  . COLONOSCOPY N/A 05/05/2013   [name]: external/internal anal canal hemorrhoids, unable to intubate TI, segemental biopsies unremarkable   . COLONOSCOPY N/A 04/11/2017   Procedure: COLONOSCOPY;  Surgeon: [name], MD;  [facility]:  [facility];  Service: Endoscopy;  Laterality: N/A;  . COLONOSCOPY WITH PROPOFOL N/A 03/31/2017   Procedure: COLONOSCOPY WITH PROPOFOL;  Surgeon: Corbin Adeourk, Robert M, MD;  Location: [facility];  Service: Endoscopy;  Laterality: N/A;  1:45pm  . ESOPHAGOGASTRODUODENOSCOPY  03/14/2007   ZOX:[id number]:Nonerosive antral gastritis with bulbar duodenitis/paucity to postbulbar duodenal folds and biopsy were benign with no evidence of villous atrophy.  . ESOPHAGOGASTRODUODENOSCOPY (EGD) WITH PROPOFOL N/A 02/09/2017    Procedure: ESOPHAGOGASTRODUODENOSCOPY (EGD) WITH PROPOFOL;  Surgeon: West BaliSandi L Fields, MD;  [facility]: [facility];  Service: Endoscopy;  Laterality: N/A;  . GIVENS CAPSULE STUDY N/A 04/08/2017   Procedure: GIVENS CAPSULE STUDY;  Surgeon: Corbin Adeourk, Robert M, MD;  [facility]: [facility];  Service: Endoscopy;  Laterality: N/A;  . HAND SURGERY     left, secondary to self-inflicted laceration  . HEMORRHOID SURGERY N/A 04/14/2017   Procedure: HEMORRHOIDECTOMY;  Surgeon: Franky MachoJenkins, Mark, MD;  Location: [facility];  Service: General;  Laterality: N/A;  . SHOULDER SURGERY     right  . TOE SURGERY     left great toe , amputated-lawnmover accident    Family Psychiatric History: see below  Family History:  Family History  Problem Relation Age of Onset  . Breast cancer Mother        deceased  . Heart disease Father   . Depression Daughter   . Anxiety disorder Daughter   . Anxiety disorder Son   . Depression Son   . Asthma Brother   . Heart attack Maternal Aunt   . Heart attack Maternal Uncle   . Heart attack Paternal Aunt   . Heart attack Paternal Uncle   . Heart attack Maternal Grandmother   . Heart attack Maternal Grandfather   . Emphysema Maternal Grandfather   . Heart attack Paternal Grandmother   . Heart attack Paternal Grandfather   . Colon cancer Neg Hx   . Liver disease Neg Hx     Social History:  Social History   Socioeconomic History  . Marital status: Divorced    Spouse name: Not on file  . Number of children: 2  . Years of education: Not on file  . Highest education level: Not on file  Occupational History  . Occupation: Disabled  Tobacco Use  . Smoking status: Former Smoker    Packs/day: 1.00    Years: 40.00    Pack years: 40.00    Types: Cigarettes    Start date: 08/04/1971    Quit date: 09/30/2019    Years since quitting: 0.8  . Smokeless tobacco: Former NeurosurgeonUser    Quit date: 09/30/2019  . Tobacco comment: peak rate of 2.5ppd, 1/2ppd on 11/27/2016 -- 6 cigarettes / day  01/25/18  Vaping Use  . Vaping Use: Never used  Substance and Sexual Activity  . Alcohol use: No    Alcohol/week: 0.0 standard drinks  . Drug use: Not Currently    Types: Marijuana    Comment: most days   . Sexual activity: Not Currently  Other Topics Concern  . Not on file  Social History Narrative   [place]. [place] 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 Questionable asbestos exposure. Does have significant exposure to fumes. No mold exposure. No bird exposure. No pets currently.  [place].   Social Determinants of Health   Financial Resource Strain:   . Difficulty of  Paying Living Expenses: Not on file  Food Insecurity:   . Worried About Programme researcher, broadcasting/film/video in the Last Year: Not on file  . Ran Out of Food in the Last Year: Not on file  Transportation Needs:   . Lack of Transportation (Medical): Not on file  . Lack of Transportation (Non-Medical): Not on file  Physical Activity:   . Days of Exercise per Week: Not on file  . Minutes of Exercise per Session: Not on file  Stress:   . Feeling of Stress : Not on file  Social Connections:   . Frequency of Communication with Friends and Family: Not on file  . Frequency of Social Gatherings with Friends and Family: Not on file  . Attends Religious Services: Not on file  . Active Member of Clubs or Organizations: Not on file  . Attends Banker Meetings: Not on file  . Marital Status: Not on file    Allergies:  Allergies  Allergen Reactions  . Augmentin [Amoxicillin-Pot Clavulanate] Rash and Other (See Comments)    Tolerated Zosyn 12/27/2019 Has patient had a PCN reaction causing immediate rash, facial/tongue/throat swelling, SOB or lightheadedness with hypotension: No Has patient had a PCN reaction causing severe rash involving mucus membranes or skin necrosis: No Has patient had a PCN reaction  that required hospitalization No Has patient had a PCN reaction occurring within the last 10 years: Yes If all of the above answers are "NO", then may proceed with Cephalosporin use.  . Ace Inhibitors Hives    Metabolic Disorder Labs: No results found for: HGBA1C, MPG No results found for: PROLACTIN Lab Results  Component Value Date   TRIG 84 12/24/2019   Lab Results  Component Value Date   TSH 1.552 05/01/2019   TSH 0.787 12/22/2018    Therapeutic Level Labs: No results found for: LITHIUM Lab Results  Component Value Date   VALPROATE 17 (L) 01/16/2020   VALPROATE 31 (L) 12/24/2019   No components found for:  CBMZ  Current Medications: Current Outpatient Medications  Medication Sig Dispense Refill  . acetaminophen (TYLENOL) 325 MG tablet Take 2 tablets (650 mg total) by mouth every 6 (six) hours as needed for mild pain, fever or headache (or Fever >/= 101). 12 tablet 0  . albuterol (PROVENTIL) (2.5 MG/3ML) 0.083% nebulizer solution Take 3 mLs (2.5 mg total) by nebulization every 4 (four) hours as needed for wheezing or shortness of breath. 75 mL 12  . albuterol (VENTOLIN HFA) 108 (90 Base) MCG/ACT inhaler Inhale 2 puffs into the lungs every 4 (four) hours as needed for wheezing or shortness of breath. 6.7 g 3  . aspirin EC 81 MG tablet Take 1 tablet (81 mg total) by mouth daily with breakfast. 30 tablet 4  . atorvastatin (LIPITOR) 20 MG tablet Take 1 tablet (20 mg total) by mouth daily. For high cholesterol (Patient taking differently: Take 20 mg by mouth every evening. For high cholesterol) 30 tablet 0  . diltiazem (CARDIZEM CD) 180 MG 24 hr capsule Take 180 mg by mouth daily.    . divalproex (DEPAKOTE ER) 500 MG 24 hr tablet Take 3 tablets (1,500 mg total) by mouth at bedtime. 90 tablet 2  . feeding supplement, ENSURE ENLIVE, (ENSURE ENLIVE) LIQD Take 237 mLs by mouth 3 (three) times daily between meals. 2370 mL 4  . FLUoxetine (PROZAC) 40 MG capsule Take 1 capsule (40 mg  total) by mouth every morning. For mood control 30 capsule 2  . Fluticasone-Umeclidin-Vilant (  TRELEGY ELLIPTA) 100-62.5-25 MCG/INH AEPB Inhale 1 puff into the lungs daily. 14 each 0  . folic acid (FOLVITE) 1 MG tablet Take 1 mg by mouth every morning.     . furosemide (LASIX) 40 MG tablet Take 40 mg by mouth daily.    Marland Kitchen gabapentin (NEURONTIN) 300 MG capsule Take 2 capsules (600 mg total) by mouth 2 (two) times daily. 120 capsule 2  . guaiFENesin (MUCINEX) 600 MG 12 hr tablet Take 1 tablet (600 mg total) by mouth 2 (two) times daily as needed for cough or to loosen phlegm. 20 tablet 0  . LORazepam (ATIVAN) 1 MG tablet Take 1 tablet (1 mg total) by mouth 2 (two) times daily. 60 tablet 2  . methocarbamol (ROBAXIN) 500 MG tablet Take 500 mg by mouth every 8 (eight) hours as needed.    . metoprolol tartrate (LOPRESSOR) 25 MG tablet Take 0.5 tablets (12.5 mg total) by mouth 2 (two) times daily. 30 tablet 2  . Multiple Vitamin (MULTIVITAMIN WITH MINERALS) TABS tablet Take 1 tablet by mouth daily. (Patient taking differently: Take 1 tablet by mouth every morning. ) 30 tablet 0  . OLANZapine (ZYPREXA) 10 MG tablet Take 1 tablet (10 mg total) by mouth 2 (two) times daily. 60 tablet 2  . pantoprazole (PROTONIX) 20 MG tablet Take 20 mg by mouth every morning.     . predniSONE (DELTASONE) 10 MG tablet Take  4 each am x 2 days,   2 each am x 2 days,  1 each am x 2 days and stop 14 tablet 0  . traZODone (DESYREL) 50 MG tablet Take 1 tablet (50 mg total) by mouth at bedtime. 30 tablet 2  . vitamin B-12 (CYANOCOBALAMIN) 500 MCG tablet Take 1,000 mcg by mouth every morning.      No current facility-administered medications for this visit.     Musculoskeletal: Strength & Muscle Tone: within normal limits Gait & Station: normal Patient leans: N/A  Psychiatric Specialty Exam: Review of Systems  Constitutional: Positive for fatigue.  Respiratory: Positive for shortness of breath.   All other systems reviewed  and are negative.   There were no vitals taken for this visit.There is no height or weight on file to calculate BMI.  General Appearance: NA  Eye Contact:  NA  Speech:  Clear and Coherent  Volume:  Normal  Mood:  Euthymic  Affect:  NA  Thought Process:  Goal Directed  Orientation:  Full (Time, Place, and Person)  Thought Content: WDL   Suicidal Thoughts:  No  Homicidal Thoughts:  No  Memory:  Immediate;   Good Recent;   Fair Remote;   NA  Judgement:  Fair  Insight:  Fair  Psychomotor Activity:  Decreased  Concentration:  Concentration: Fair and Attention Span: Fair  Recall:  [employer] of Knowledge: Fair  Language: Good  Akathisia:  No  Handed:  Right  AIMS (if indicated): not done  Assets:  Communication Skills Desire for Improvement Resilience Social Support  ADL's:  Intact  Cognition: WNL  Sleep:  Poor   Screenings: AIMS     [facility]  AIMS Total Score 0    AUDIT     [facility]  Alcohol Use Disorder Identification Test Final Score (AUDIT) 18       Assessment and Plan: This patient is a 59 year old male with a history of bipolar disorder  polysubstance abuse in remission and severe COPD on continuous oxygen. He seems to be stable at present although he is not sleeping very well. He will continue Depakote ER 1500 mg at bedtime for mood stabilization, Prozac 40 mg daily for depression, gabapentin 600 mg twice daily for anxiety, olanzapine 10 mg daily for mood stabilization, Ativan twice daily for anxiety and trazodone 50 mg will be added at bedtime for sleep. He will return to see me in 3 months   Diannia Ruder, MD 08/09/2020, 10:15 AM

## 2020-08-21 ENCOUNTER — Ambulatory Visit: Payer: Medicare Other | Admitting: Internal Medicine

## 2020-09-10 ENCOUNTER — Telehealth: Payer: Self-pay | Admitting: Internal Medicine

## 2020-09-10 NOTE — Telephone Encounter (Signed)
The new rec is only use if known vascular dz but I can't make a specific rec in his case as I only focus on his lungs, should discuss with pcp.

## 2020-09-10 NOTE — Telephone Encounter (Signed)
Spoke with [name], again NO PHI was given to her. She is aware of Dr. Thurston Hole response. Nothing further was needed.

## 2020-09-10 NOTE — Telephone Encounter (Signed)
Spoke with pt's caregiver, [name]. She is NOT listed on the pt's DPR, no PHI was given to her. She is wanting to know if pt should stop taking ASA 81mg .  Dr. - please advise. Thanks.

## 2020-09-29 ENCOUNTER — Encounter (HOSPITAL_COMMUNITY): Payer: Self-pay | Admitting: Emergency Medicine

## 2020-09-29 ENCOUNTER — Emergency Department (HOSPITAL_COMMUNITY): Payer: Medicare Other

## 2020-09-29 ENCOUNTER — Inpatient Hospital Stay (HOSPITAL_COMMUNITY): Payer: Medicare Other

## 2020-09-29 ENCOUNTER — Inpatient Hospital Stay (HOSPITAL_COMMUNITY)
Admission: EM | Admit: 2020-09-29 | Discharge: 2020-10-09 | DRG: 208 | Disposition: A | Discharge status: Home Health | Payer: Medicare Other | Attending: Internal Medicine | Admitting: Internal Medicine

## 2020-09-29 ENCOUNTER — Other Ambulatory Visit: Payer: Self-pay

## 2020-09-29 DIAGNOSIS — I10 Essential (primary) hypertension: Secondary | ICD-10-CM | POA: Diagnosis not present

## 2020-09-29 DIAGNOSIS — D6489 Other specified anemias: Secondary | ICD-10-CM | POA: Diagnosis present

## 2020-09-29 DIAGNOSIS — Z7952 Long term (current) use of systemic steroids: Secondary | ICD-10-CM

## 2020-09-29 DIAGNOSIS — Z7951 Long term (current) use of inhaled steroids: Secondary | ICD-10-CM

## 2020-09-29 DIAGNOSIS — F319 Bipolar disorder, unspecified: Secondary | ICD-10-CM | POA: Diagnosis present

## 2020-09-29 DIAGNOSIS — R7989 Other specified abnormal findings of blood chemistry: Secondary | ICD-10-CM | POA: Diagnosis not present

## 2020-09-29 DIAGNOSIS — E872 Acidosis, unspecified: Secondary | ICD-10-CM

## 2020-09-29 DIAGNOSIS — J9621 Acute and chronic respiratory failure with hypoxia: Secondary | ICD-10-CM | POA: Diagnosis present

## 2020-09-29 DIAGNOSIS — I5033 Acute on chronic diastolic (congestive) heart failure: Secondary | ICD-10-CM | POA: Diagnosis present

## 2020-09-29 DIAGNOSIS — G931 Anoxic brain damage, not elsewhere classified: Secondary | ICD-10-CM | POA: Diagnosis present

## 2020-09-29 DIAGNOSIS — J9622 Acute and chronic respiratory failure with hypercapnia: Secondary | ICD-10-CM | POA: Diagnosis not present

## 2020-09-29 DIAGNOSIS — E785 Hyperlipidemia, unspecified: Secondary | ICD-10-CM | POA: Diagnosis present

## 2020-09-29 DIAGNOSIS — E871 Hypo-osmolality and hyponatremia: Secondary | ICD-10-CM

## 2020-09-29 DIAGNOSIS — F13239 Sedative, hypnotic or anxiolytic dependence with withdrawal, unspecified: Secondary | ICD-10-CM | POA: Diagnosis not present

## 2020-09-29 DIAGNOSIS — I5030 Unspecified diastolic (congestive) heart failure: Secondary | ICD-10-CM | POA: Diagnosis not present

## 2020-09-29 DIAGNOSIS — R739 Hyperglycemia, unspecified: Secondary | ICD-10-CM | POA: Diagnosis present

## 2020-09-29 DIAGNOSIS — L8990 Pressure ulcer of unspecified site, unspecified stage: Secondary | ICD-10-CM | POA: Insufficient documentation

## 2020-09-29 DIAGNOSIS — Z515 Encounter for palliative care: Secondary | ICD-10-CM | POA: Diagnosis not present

## 2020-09-29 DIAGNOSIS — Z825 Family history of asthma and other chronic lower respiratory diseases: Secondary | ICD-10-CM

## 2020-09-29 DIAGNOSIS — Z881 Allergy status to other antibiotic agents status: Secondary | ICD-10-CM | POA: Diagnosis not present

## 2020-09-29 DIAGNOSIS — R0600 Dyspnea, unspecified: Secondary | ICD-10-CM | POA: Diagnosis present

## 2020-09-29 DIAGNOSIS — J441 Chronic obstructive pulmonary disease with (acute) exacerbation: Secondary | ICD-10-CM | POA: Diagnosis present

## 2020-09-29 DIAGNOSIS — Z8249 Family history of ischemic heart disease and other diseases of the circulatory system: Secondary | ICD-10-CM

## 2020-09-29 DIAGNOSIS — Z9981 Dependence on supplemental oxygen: Secondary | ICD-10-CM

## 2020-09-29 DIAGNOSIS — E876 Hypokalemia: Secondary | ICD-10-CM | POA: Diagnosis present

## 2020-09-29 DIAGNOSIS — K219 Gastro-esophageal reflux disease without esophagitis: Secondary | ICD-10-CM | POA: Diagnosis present

## 2020-09-29 DIAGNOSIS — J9602 Acute respiratory failure with hypercapnia: Secondary | ICD-10-CM

## 2020-09-29 DIAGNOSIS — G9341 Metabolic encephalopathy: Secondary | ICD-10-CM | POA: Diagnosis present

## 2020-09-29 DIAGNOSIS — I110 Hypertensive heart disease with heart failure: Secondary | ICD-10-CM | POA: Diagnosis present

## 2020-09-29 DIAGNOSIS — F17210 Nicotine dependence, cigarettes, uncomplicated: Secondary | ICD-10-CM | POA: Diagnosis present

## 2020-09-29 DIAGNOSIS — J9601 Acute respiratory failure with hypoxia: Secondary | ICD-10-CM | POA: Insufficient documentation

## 2020-09-29 DIAGNOSIS — Z7982 Long term (current) use of aspirin: Secondary | ICD-10-CM | POA: Diagnosis not present

## 2020-09-29 DIAGNOSIS — Z7189 Other specified counseling: Secondary | ICD-10-CM

## 2020-09-29 DIAGNOSIS — Z79899 Other long term (current) drug therapy: Secondary | ICD-10-CM

## 2020-09-29 DIAGNOSIS — Z803 Family history of malignant neoplasm of breast: Secondary | ICD-10-CM

## 2020-09-29 DIAGNOSIS — Z20822 Contact with and (suspected) exposure to covid-19: Secondary | ICD-10-CM | POA: Diagnosis present

## 2020-09-29 DIAGNOSIS — J9690 Respiratory failure, unspecified, unspecified whether with hypoxia or hypercapnia: Secondary | ICD-10-CM

## 2020-09-29 DIAGNOSIS — I5032 Chronic diastolic (congestive) heart failure: Secondary | ICD-10-CM | POA: Diagnosis not present

## 2020-09-29 DIAGNOSIS — Z4659 Encounter for fitting and adjustment of other gastrointestinal appliance and device: Secondary | ICD-10-CM

## 2020-09-29 LAB — CBC WITH DIFFERENTIAL/PLATELET
Abs Immature Granulocytes: 0.03 10*3/uL (ref 0.00–0.07)
Basophils Absolute: 0 10*3/uL (ref 0.0–0.1)
Basophils Relative: 0 %
Eosinophils Absolute: 0 10*3/uL (ref 0.0–0.5)
Eosinophils Relative: 1 %
HCT: 41.1 % (ref 39.0–52.0)
Hemoglobin: 13.3 g/dL (ref 13.0–17.0)
Immature Granulocytes: 0 %
Lymphocytes Relative: 12 %
Lymphs Abs: 0.9 10*3/uL (ref 0.7–4.0)
MCH: 28.4 pg (ref 26.0–34.0)
MCHC: 32.4 g/dL (ref 30.0–36.0)
MCV: 87.6 fL (ref 80.0–100.0)
Monocytes Absolute: 0.6 10*3/uL (ref 0.1–1.0)
Monocytes Relative: 7 %
Neutro Abs: 5.9 10*3/uL (ref 1.7–7.7)
Neutrophils Relative %: 80 %
Platelets: 326 10*3/uL (ref 150–400)
RBC: 4.69 MIL/uL (ref 4.22–5.81)
RDW: 12.9 % (ref 11.5–15.5)
WBC: 7.4 10*3/uL (ref 4.0–10.5)
nRBC: 0 % (ref 0.0–0.2)

## 2020-09-29 LAB — URINALYSIS, ROUTINE W REFLEX MICROSCOPIC
Bacteria, UA: NONE SEEN
Bilirubin Urine: NEGATIVE
Glucose, UA: NEGATIVE mg/dL
Ketones, ur: NEGATIVE mg/dL
Leukocytes,Ua: NEGATIVE
Nitrite: NEGATIVE
Protein, ur: NEGATIVE mg/dL
Specific Gravity, Urine: 1.009 (ref 1.005–1.030)
pH: 7 (ref 5.0–8.0)

## 2020-09-29 LAB — COMPREHENSIVE METABOLIC PANEL
ALT: 24 U/L (ref 0–44)
AST: 44 U/L — ABNORMAL HIGH (ref 15–41)
Albumin: 3.6 g/dL (ref 3.5–5.0)
Alkaline Phosphatase: 81 U/L (ref 38–126)
Anion gap: 7 (ref 5–15)
BUN: 8 mg/dL (ref 6–20)
CO2: 47 mmol/L — ABNORMAL HIGH (ref 22–32)
Calcium: 9 mg/dL (ref 8.9–10.3)
Chloride: 75 mmol/L — ABNORMAL LOW (ref 98–111)
Creatinine, Ser: 0.73 mg/dL (ref 0.61–1.24)
GFR, Estimated: >60 mL/min (ref >60)
Glucose, Bld: 161 mg/dL — ABNORMAL HIGH (ref 70–99)
Potassium: 3.5 mmol/L (ref 3.5–5.1)
Sodium: 129 mmol/L — ABNORMAL LOW (ref 135–145)
Total Bilirubin: 0.6 mg/dL (ref 0.3–1.2)
Total Protein: 7.6 g/dL (ref 6.5–8.1)

## 2020-09-29 LAB — BRAIN NATRIURETIC PEPTIDE: B Natriuretic Peptide: 147 pg/mL — ABNORMAL HIGH (ref 0.0–100.0)

## 2020-09-29 LAB — BASIC METABOLIC PANEL
Anion gap: 10 (ref 5–15)
BUN: 10 mg/dL (ref 6–20)
CO2: 40 mmol/L — ABNORMAL HIGH (ref 22–32)
Calcium: 8.8 mg/dL — ABNORMAL LOW (ref 8.9–10.3)
Chloride: 80 mmol/L — ABNORMAL LOW (ref 98–111)
Creatinine, Ser: 0.64 mg/dL (ref 0.61–1.24)
GFR, Estimated: >60 mL/min (ref >60)
Glucose, Bld: 118 mg/dL — ABNORMAL HIGH (ref 70–99)
Potassium: 3.4 mmol/L — ABNORMAL LOW (ref 3.5–5.1)
Sodium: 130 mmol/L — ABNORMAL LOW (ref 135–145)

## 2020-09-29 LAB — CBC
HCT: 35.4 % — ABNORMAL LOW (ref 39.0–52.0)
Hemoglobin: 11.2 g/dL — ABNORMAL LOW (ref 13.0–17.0)
MCH: 27.4 pg (ref 26.0–34.0)
MCHC: 31.6 g/dL (ref 30.0–36.0)
MCV: 86.6 fL (ref 80.0–100.0)
Platelets: 290 10*3/uL (ref 150–400)
RBC: 4.09 MIL/uL — ABNORMAL LOW (ref 4.22–5.81)
RDW: 12.9 % (ref 11.5–15.5)
WBC: 7.4 10*3/uL (ref 4.0–10.5)
nRBC: 0 % (ref 0.0–0.2)

## 2020-09-29 LAB — LACTIC ACID, PLASMA
Lactic Acid, Venous: 2 mmol/L (ref 0.5–1.9)
Lactic Acid, Venous: 2.5 mmol/L (ref 0.5–1.9)
Lactic Acid, Venous: 2.9 mmol/L (ref 0.5–1.9)

## 2020-09-29 LAB — PROCALCITONIN: Procalcitonin: 0.13 ng/mL

## 2020-09-29 LAB — BLOOD GAS, ARTERIAL
Acid-Base Excess: 18 mmol/L — ABNORMAL HIGH (ref 0.0–2.0)
Acid-Base Excess: 19.5 mmol/L — ABNORMAL HIGH (ref 0.0–2.0)
Bicarbonate: 40.2 mmol/L — ABNORMAL HIGH (ref 20.0–28.0)
Bicarbonate: 42 mmol/L — ABNORMAL HIGH (ref 20.0–28.0)
FIO2: 50
FIO2: 40
O2 Saturation: 98.3 %
O2 Saturation: 90 %
Patient temperature: 36.3
Patient temperature: 36.1
pCO2 arterial: 71.4 mmHg (ref 32.0–48.0)
pCO2 arterial: 60.4 mmHg — ABNORMAL HIGH (ref 32.0–48.0)
pH, Arterial: 7.403 (ref 7.350–7.450)
pH, Arterial: 7.479 — ABNORMAL HIGH (ref 7.350–7.450)
pO2, Arterial: 110 mmHg — ABNORMAL HIGH (ref 83.0–108.0)
pO2, Arterial: 53.3 mmHg — ABNORMAL LOW (ref 83.0–108.0)

## 2020-09-29 LAB — GLUCOSE, CAPILLARY
Glucose-Capillary: 100 mg/dL — ABNORMAL HIGH (ref 70–99)
Glucose-Capillary: 111 mg/dL — ABNORMAL HIGH (ref 70–99)
Glucose-Capillary: 118 mg/dL — ABNORMAL HIGH (ref 70–99)

## 2020-09-29 LAB — RESPIRATORY PANEL BY RT PCR (FLU A&B, COVID)
Influenza A by PCR: NEGATIVE
Influenza B by PCR: NEGATIVE
SARS Coronavirus 2 by RT PCR: NEGATIVE

## 2020-09-29 LAB — PHOSPHORUS: Phosphorus: <1 mg/dL — CL (ref 2.5–4.6)

## 2020-09-29 LAB — TROPONIN I (HIGH SENSITIVITY)
Troponin I (High Sensitivity): 5 ng/L (ref <18)
Troponin I (High Sensitivity): 5 ng/L (ref <18)

## 2020-09-29 LAB — MAGNESIUM: Magnesium: 1.8 mg/dL (ref 1.7–2.4)

## 2020-09-29 MED ORDER — ALBUTEROL SULFATE (2.5 MG/3ML) 0.083% IN NEBU
INHALATION_SOLUTION | RESPIRATORY_TRACT | Status: AC
  Administered 2020-09-29: 5 mg
  Filled 2020-09-29: qty 6

## 2020-09-29 MED ORDER — METHYLPREDNISOLONE SODIUM SUCC 40 MG IJ SOLR: 40.0000 mg | Freq: Two times a day (BID) | INTRAMUSCULAR | Status: DC

## 2020-09-29 MED ORDER — PROPOFOL 1000 MG/100ML IV EMUL
5.0000 ug/kg/min | INTRAVENOUS | Status: DC
  Administered 2020-09-29: 50 ug/kg/min via INTRAVENOUS
  Administered 2020-09-29: 30 ug/kg/min via INTRAVENOUS
  Administered 2020-09-29: 50 ug/kg/min via INTRAVENOUS
  Administered 2020-09-30: 30 ug/kg/min via INTRAVENOUS
  Administered 2020-09-30: 15 ug/kg/min via INTRAVENOUS
  Filled 2020-09-29 (×5): qty 100

## 2020-09-29 MED ORDER — ORAL CARE MOUTH RINSE
15.0000 mL | OROMUCOSAL | Status: DC
  Administered 2020-09-29 – 2020-10-02 (×28): 15 mL via OROMUCOSAL

## 2020-09-29 MED ORDER — POTASSIUM & SODIUM PHOSPHATES 280-160-250 MG PO PACK
1.0000 | PACK | Freq: Three times a day (TID) | ORAL | Status: AC
  Administered 2020-09-29 (×2): 1
  Filled 2020-09-29: qty 2
  Filled 2020-09-29 (×3): qty 1

## 2020-09-29 MED ORDER — FENTANYL CITRATE (PF) 100 MCG/2ML IJ SOLN
50.0000 ug | Freq: Once | INTRAMUSCULAR | Status: AC
  Administered 2020-09-29: 50 ug via INTRAVENOUS

## 2020-09-29 MED ORDER — CHLORHEXIDINE GLUCONATE CLOTH 2 % EX PADS
6.0000 | MEDICATED_PAD | Freq: Every day | CUTANEOUS | Status: DC
  Administered 2020-09-30 – 2020-10-09 (×9): 6 via TOPICAL

## 2020-09-29 MED ORDER — SODIUM CHLORIDE 0.9 % IV SOLN: 2.0000 g | Freq: Three times a day (TID) | INTRAVENOUS | Status: DC

## 2020-09-29 MED ORDER — SODIUM CHLORIDE 0.9 % IV SOLN: 1.0000 g | INTRAVENOUS | Status: DC

## 2020-09-29 MED ORDER — IPRATROPIUM-ALBUTEROL 0.5-2.5 (3) MG/3ML IN SOLN
3.0000 mL | RESPIRATORY_TRACT | Status: DC | PRN
  Administered 2020-10-02 – 2020-10-05 (×2): 3 mL via RESPIRATORY_TRACT
  Filled 2020-09-29 (×2): qty 3

## 2020-09-29 MED ORDER — FENTANYL BOLUS VIA INFUSION
50.0000 ug | INTRAVENOUS | Status: DC | PRN
  Administered 2020-09-29: 50 ug via INTRAVENOUS
  Filled 2020-09-29: qty 50

## 2020-09-29 MED ORDER — CHLORHEXIDINE GLUCONATE 0.12% ORAL RINSE (MEDLINE KIT)
15.0000 mL | Freq: Two times a day (BID) | OROMUCOSAL | Status: DC
  Administered 2020-09-29 – 2020-10-08 (×19): 15 mL via OROMUCOSAL

## 2020-09-29 MED ORDER — SODIUM CHLORIDE 0.9 % IV SOLN: INTRAVENOUS | Status: DC

## 2020-09-29 MED ORDER — SUCCINYLCHOLINE CHLORIDE 20 MG/ML IJ SOLN
150.0000 mg | Freq: Once | INTRAMUSCULAR | Status: AC
  Administered 2020-09-29: 150 mg via INTRAVENOUS

## 2020-09-29 MED ORDER — IPRATROPIUM BROMIDE 0.02 % IN SOLN
RESPIRATORY_TRACT | Status: AC
  Administered 2020-09-29: 0.5 mg
  Filled 2020-09-29: qty 2.5

## 2020-09-29 MED ORDER — PANTOPRAZOLE SODIUM 40 MG IV SOLR
40.0000 mg | INTRAVENOUS | Status: DC
  Administered 2020-09-29 – 2020-09-30 (×2): 40 mg via INTRAVENOUS
  Filled 2020-09-29 (×2): qty 40

## 2020-09-29 MED ORDER — FENTANYL 2500MCG IN NS 250ML (10MCG/ML) PREMIX INFUSION
INTRAVENOUS | Status: AC
  Administered 2020-09-29: 50 ug/h via INTRAVENOUS
  Filled 2020-09-29: qty 250

## 2020-09-29 MED ORDER — IPRATROPIUM-ALBUTEROL 0.5-2.5 (3) MG/3ML IN SOLN
3.0000 mL | Freq: Four times a day (QID) | RESPIRATORY_TRACT | Status: DC
  Administered 2020-09-29 – 2020-10-05 (×24): 3 mL via RESPIRATORY_TRACT
  Filled 2020-09-29 (×24): qty 3

## 2020-09-29 MED ORDER — METHYLPREDNISOLONE SODIUM SUCC 40 MG IJ SOLR
40.0000 mg | Freq: Two times a day (BID) | INTRAMUSCULAR | Status: DC
  Administered 2020-09-29 – 2020-10-04 (×10): 40 mg via INTRAVENOUS
  Filled 2020-09-29 (×10): qty 1

## 2020-09-29 MED ORDER — SODIUM CHLORIDE 0.9 % IV SOLN
1.0000 g | Freq: Once | INTRAVENOUS | Status: AC
  Administered 2020-09-29: 1 g via INTRAVENOUS
  Filled 2020-09-29: qty 10

## 2020-09-29 MED ORDER — DM-GUAIFENESIN ER 30-600 MG PO TB12: 1.0000 | ORAL_TABLET | Freq: Two times a day (BID) | ORAL | Status: DC

## 2020-09-29 MED ORDER — ENOXAPARIN SODIUM 40 MG/0.4ML ~~LOC~~ SOLN
40.0000 mg | SUBCUTANEOUS | Status: DC
  Administered 2020-09-29 – 2020-10-09 (×11): 40 mg via SUBCUTANEOUS
  Filled 2020-09-29 (×11): qty 0.4

## 2020-09-29 MED ORDER — METHYLPREDNISOLONE SODIUM SUCC 125 MG IJ SOLR
125.0000 mg | Freq: Once | INTRAMUSCULAR | Status: AC
  Administered 2020-09-29: 125 mg via INTRAVENOUS
  Filled 2020-09-29: qty 2

## 2020-09-29 MED ORDER — ETOMIDATE 2 MG/ML IV SOLN
20.0000 mg | Freq: Once | INTRAVENOUS | Status: AC
  Administered 2020-09-29: 20 mg via INTRAVENOUS

## 2020-09-29 MED ORDER — SODIUM CHLORIDE 0.9 % IV SOLN: Freq: Once | INTRAVENOUS | Status: AC

## 2020-09-29 MED ORDER — SODIUM CHLORIDE 0.9 % IV SOLN
500.0000 mg | INTRAVENOUS | Status: DC
  Administered 2020-09-29: 500 mg via INTRAVENOUS
  Filled 2020-09-29: qty 500

## 2020-09-29 MED ORDER — PANTOPRAZOLE SODIUM 40 MG IV SOLR: 40.0000 mg | INTRAVENOUS | Status: DC

## 2020-09-29 MED ORDER — FENTANYL 2500MCG IN NS 250ML (10MCG/ML) PREMIX INFUSION
25.0000 ug/h | INTRAVENOUS | Status: DC
  Administered 2020-09-29 – 2020-09-30 (×2): 200 ug/h via INTRAVENOUS
  Filled 2020-09-29 (×2): qty 250

## 2020-09-29 MED ORDER — PIPERACILLIN-TAZOBACTAM 3.375 G IVPB
3.3750 g | Freq: Three times a day (TID) | INTRAVENOUS | Status: DC
  Administered 2020-09-29 – 2020-10-01 (×5): 3.375 g via INTRAVENOUS
  Filled 2020-09-29 (×5): qty 50

## 2020-09-29 MED ORDER — VANCOMYCIN HCL 2000 MG/400ML IV SOLN
2000.0000 mg | Freq: Once | INTRAVENOUS | Status: AC
  Administered 2020-09-29: 2000 mg via INTRAVENOUS
  Filled 2020-09-29: qty 400

## 2020-09-29 MED ORDER — IPRATROPIUM-ALBUTEROL 0.5-2.5 (3) MG/3ML IN SOLN
3.0000 mL | Freq: Once | RESPIRATORY_TRACT | Status: AC
  Administered 2020-09-29: 3 mL via RESPIRATORY_TRACT
  Filled 2020-09-29: qty 3

## 2020-09-29 MED ORDER — SODIUM CHLORIDE 0.9 % IV BOLUS
30.0000 mL/kg | Freq: Once | INTRAVENOUS | Status: AC
  Administered 2020-09-29: 2547 mL via INTRAVENOUS

## 2020-09-29 MED ORDER — VANCOMYCIN HCL IN DEXTROSE 1-5 GM/200ML-% IV SOLN
1000.0000 mg | Freq: Two times a day (BID) | INTRAVENOUS | Status: DC
  Administered 2020-09-30: 1000 mg via INTRAVENOUS
  Filled 2020-09-29: qty 200

## 2020-09-29 MED ORDER — LACTATED RINGERS IV SOLN: INTRAVENOUS | Status: DC

## 2020-09-29 NOTE — ED Notes (Signed)
Date and time results received: 09/29/20 3:36 AM(use smartphrase ".now" to insert current time)  Test: PCO2 Critical Value: 71.4  Name of Provider Notified: Dr Wilkie Aye  Orders Received? Or Actions Taken?: see chart

## 2020-09-29 NOTE — ED Provider Notes (Signed)
[facility] EMERGENCY DEPARTMENT Provider Note   CSN: [id number] Arrival date & time: 09/29/20  0152     History Chief Complaint  Patient presents with  . Respiratory Distress    Jeffrey [name] is a 59 y.o. male.  HPI     This is a 59 year old male with a history of hypertension, hyperlipidemia, COPD, CHF who presents with respiratory distress.  Patient brought in by EMS.  Per EMS they were called out for shortness of breath.  Upon their arrival he had agonal respirations and was unresponsive.  Per family, he had been having trouble for the last hour.  EMS was unable to get a line.  O2 sats improved to 85% with assisted bag ventilations.  Level 5 caveat for acuity of condition.  Past Medical History:  Diagnosis Date  . Acute blood loss anemia 02/07/2017  . Anxiety   . Arthritis    deg disease, bulging disk,  shoulder level  . Bipolar disorder (HCC)   . Bipolar disorder (HCC)   . CHF (congestive heart failure) (HCC)   . 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 Depression    anxiety  . Hyperlipidemia   . Hypertension   . Peptic ulcer disease    Review  . Pneumonia   . [name] (peripheral vascular disease) (HCC) 06/18/2015  . Shortness of breath     Patient Active Problem List   Diagnosis Date Noted  . Rhinitis, chronic 08/01/2020  . Chronic respiratory failure with hypoxia and hypercapnia (HCC) 03/30/2020  . Acute on chronic diastolic CHF (congestive heart failure) (HCC) 12/26/2019  . Aspiration pneumonitis (HCC) 12/26/2019  . Acute on chronic respiratory failure with hypoxia and hypercapnia (HCC) 12/25/2019  . Lobar pneumonia (HCC) 12/25/2019  . Acute metabolic encephalopathy 12/25/2019  . Acute on chronic respiratory failure with hypercapnia (HCC) 12/24/2019  . COPD with acute exacerbation (HCC) 10/12/2019  . Respiratory failure with hypoxia (HCC) 04/30/2019  . Acute on chronic respiratory failure with hypoxia (HCC) 04/30/2019  . Bronchiectasis (HCC) 03/24/2019  . Fever  03/24/2019  . Cough   . Dysphagia   . Palliative care by specialist   . Goals of care, counseling/discussion   . Hepatic encephalopathy (HCC)   . COPD exacerbation (HCC) 09/16/2018  . Essential hypertension 09/16/2018  . GERD (gastroesophageal reflux disease) 09/16/2018  . Hypoxia   . Chronic diastolic CHF (congestive heart failure) (HCC)   . Bipolar disorder with severe depression (HCC) 02/01/2018  . Alcohol use disorder, severe, dependence (HCC) 02/01/2018  . Cannabis use disorder, severe, dependence (HCC) 02/01/2018  . MDD (major depressive disorder), recurrent episode, severe (HCC) 01/31/2018  . Internal and external bleeding hemorrhoids   . Rectal bleeding   . Iron deficiency anemia due to chronic blood loss 03/09/2017  . Anemia   . GIB (gastrointestinal bleeding) 02/07/2017  . Peptic ulcer disease   . Bipolar disorder (HCC)   . 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 H/O: depression 08/26/2016  . Dyslipidemia 08/26/2016  . Cigarette smoker 08/26/2016  . [name] (peripheral vascular disease) (HCC) 06/18/2015  . Chronic diarrhea 05/02/2013  . Hematochezia 05/02/2013  . Abnormal weight loss 05/02/2013  . Abdominal pain, periumbilical 05/02/2013    Past Surgical History:  Procedure Laterality Date  . BIOPSY  02/09/2017   Procedure: BIOPSY;  Surgeon: [name], MD;  Location: [facility];  Service: Endoscopy;;  duodenal gastric  . COLONOSCOPY  03/14/2007   [id number]:[id number] colonoscopy and terminal ileoscopy except external hemorrhoids  .  COLONOSCOPY N/A 05/05/2013   [name]: external/internal anal canal hemorrhoids, unable to intubate TI, segemental biopsies unremarkable   . COLONOSCOPY N/A 04/11/2017   Procedure: COLONOSCOPY;  Surgeon: [name], MD;  Location: [facility];  Service: Endoscopy;  Laterality: N/A;  . COLONOSCOPY WITH PROPOFOL N/A 03/31/2017   Procedure: COLONOSCOPY WITH PROPOFOL;  Surgeon: [name], MD;  Location: [facility];  Service:  Endoscopy;  Laterality: N/A;  1:45pm  . ESOPHAGOGASTRODUODENOSCOPY  03/14/2007   [id number] antral gastritis with bulbar duodenitis/paucity to postbulbar duodenal folds and biopsy were benign with no evidence of villous atrophy.  . ESOPHAGOGASTRODUODENOSCOPY (EGD) WITH PROPOFOL N/A 02/09/2017   Procedure: ESOPHAGOGASTRODUODENOSCOPY (EGD) WITH PROPOFOL;  Surgeon: [name], MD;  Location: [facility];  Service: Endoscopy;  Laterality: N/A;  . GIVENS CAPSULE STUDY N/A 04/08/2017   Procedure: GIVENS CAPSULE STUDY;  Surgeon: [name], MD;  Location: [facility];  Service: Endoscopy;  Laterality: N/A;  . HAND SURGERY     left, secondary to self-inflicted laceration  . HEMORRHOID SURGERY N/A 04/14/2017   Procedure: HEMORRHOIDECTOMY;  Surgeon: [name], MD;  Location: [facility];  Service: General;  Laterality: N/A;  . SHOULDER SURGERY     right  . TOE SURGERY     left great toe , amputated-lawnmover accident       Family History  Problem Relation Age of Onset  . Breast cancer Mother        deceased  . Heart disease Father   . Depression Daughter   . Anxiety disorder Daughter   . Anxiety disorder Son   . Depression Son   . Asthma Brother   . Heart attack Maternal Aunt   . Heart attack Maternal Uncle   . Heart attack Paternal Aunt   . Heart attack Paternal Uncle   . Heart attack Maternal Grandmother   . Heart attack Maternal Grandfather   . Emphysema Maternal Grandfather   . Heart attack Paternal Grandmother   . Heart attack Paternal Grandfather   . Colon cancer Neg Hx   . Liver disease Neg Hx     Social History   Tobacco Use  . Smoking status: Former Smoker    Packs/day: 1.00    Years: 40.00    Pack years: 40.00    Types: Cigarettes    Start date: 08/04/1971    Quit date: 09/30/2019    Years since quitting: 1.0  . Smokeless tobacco: Former Neurosurgeon    Quit date: 09/30/2019  . Tobacco comment: peak rate of 2.5ppd, 1/2ppd on 11/27/2016 -- 6 cigarettes / day  01/25/18  Vaping Use  . Vaping Use: Never used  Substance Use Topics  . Alcohol use: No    Alcohol/week: 0.0 standard drinks  . Drug use: Not Currently    Types: Marijuana    Comment: most days     Home Medications Prior to Admission medications   Medication Sig Start Date End Date Taking? Authorizing Provider  acetaminophen (TYLENOL) 325 MG tablet Take 2 tablets (650 mg total) by mouth every 6 (six) hours as needed for mild pain, fever or headache (or Fever >/= 101). 01/10/20   Shon Hale, MD  albuterol (PROVENTIL) (2.5 MG/3ML) 0.083% nebulizer solution Take 3 mLs (2.5 mg total) by nebulization every 4 (four) hours as needed for wheezing or shortness of breath. 03/29/20   Nyoka Cowden, MD  albuterol (VENTOLIN HFA) 108 (90 Base) MCG/ACT inhaler Inhale 2 puffs into the lungs every 4 (  four) hours as needed for wheezing or shortness of breath. 12/04/19   Eber HongMiller, Brian, MD  aspirin EC 81 MG tablet Take 1 tablet (81 mg total) by mouth daily with breakfast. 01/10/20   Shon HaleEmokpae, Courage, MD  atorvastatin (LIPITOR) 20 MG tablet Take 1 tablet (20 mg total) by mouth daily. For high cholesterol Patient taking differently: Take 20 mg by mouth every evening. For high cholesterol 02/09/18   Money, Gerlene Burdockravis B, FNP  diltiazem (CARDIZEM CD) 180 MG 24 hr capsule Take 180 mg by mouth daily. 12/25/19   [provider]  divalproex (DEPAKOTE ER) 500 MG 24 hr tablet Take 3 tablets (1,500 mg total) by mouth at bedtime. 08/09/20   Myrlene Brokeross, Deborah R, MD  feeding supplement, ENSURE ENLIVE, (ENSURE ENLIVE) LIQD Take 237 mLs by mouth 3 (three) times daily between meals. 01/10/20   Shon HaleEmokpae, Courage, MD  FLUoxetine (PROZAC) 40 MG capsule Take 1 capsule (40 mg total) by mouth every morning. For mood control 08/09/20   Myrlene Brokeross, Deborah R, MD  Fluticasone-Umeclidin-Vilant (TRELEGY ELLIPTA) 100-62.5-25 MCG/INH AEPB Inhale 1 puff into the lungs daily. 03/29/20   Nyoka CowdenWert, Michael B, MD  folic acid (FOLVITE) 1 MG tablet Take 1 mg by  mouth every morning.  11/29/19   [provider]  furosemide (LASIX) 40 MG tablet Take 40 mg by mouth daily. 12/25/19   [provider]  gabapentin (NEURONTIN) 300 MG capsule Take 2 capsules (600 mg total) by mouth 2 (two) times daily. 08/09/20   Myrlene Brokeross, Deborah R, MD  guaiFENesin (MUCINEX) 600 MG 12 hr tablet Take 1 tablet (600 mg total) by mouth 2 (two) times daily as needed for cough or to loosen phlegm. 01/10/20   Shon HaleEmokpae, Courage, MD  LORazepam (ATIVAN) 1 MG tablet Take 1 tablet (1 mg total) by mouth 2 (two) times daily. 08/09/20   Myrlene Brokeross, Deborah R, MD  methocarbamol (ROBAXIN) 500 MG tablet Take 500 mg by mouth every 8 (eight) hours as needed. 12/25/19   [provider]  metoprolol tartrate (LOPRESSOR) 25 MG tablet Take 0.5 tablets (12.5 mg total) by mouth 2 (two) times daily. 01/10/20   Shon HaleEmokpae, Courage, MD  Multiple Vitamin (MULTIVITAMIN WITH MINERALS) TABS tablet Take 1 tablet by mouth daily. Patient taking differently: Take 1 tablet by mouth every morning.  01/13/19   Mikhail, Nita SellsMaryann, DO  OLANZapine (ZYPREXA) 10 MG tablet Take 1 tablet (10 mg total) by mouth 2 (two) times daily. 08/09/20 08/09/21  Myrlene Brokeross, Deborah R, MD  pantoprazole (PROTONIX) 20 MG tablet Take 20 mg by mouth every morning.  11/29/19   [provider]  predniSONE (DELTASONE) 10 MG tablet Take  4 each am x 2 days,   2 each am x 2 days,  1 each am x 2 days and stop 07/31/20   Nyoka CowdenWert, Michael B, MD  traZODone (DESYREL) 50 MG tablet Take 1 tablet (50 mg total) by mouth at bedtime. 08/09/20   Myrlene Brokeross, Deborah R, MD  vitamin B-12 (CYANOCOBALAMIN) 500 MCG tablet Take 1,000 mcg by mouth every morning.     [provider]    Allergies    Augmentin [amoxicillin-pot clavulanate] and Ace inhibitors  Review of Systems   Review of Systems  Unable to perform ROS: Acuity of condition    Physical Exam Updated Vital Signs BP (!) 86/55   Pulse 69   Temp (!) 97.3 F (36.3 C)   Resp 16   Ht 1.778 m (5\' 10" )   Wt  84.9 kg   SpO2 99%  BMI 26.86 kg/m   Physical Exam Vitals and nursing note reviewed.  Constitutional:      Appearance: He is well-developed.     Comments: Somnolent, minimally arousable, agonal respirations  HENT:     Head: Normocephalic and atraumatic.     Nose: Nose normal.     Mouth/Throat:     Mouth: Mucous membranes are moist.  Eyes:     Pupils: Pupils are equal, round, and reactive to light.     Comments: Pupils 4 mm reactive bilaterally  Cardiovascular:     Rate and Rhythm: Normal rate and regular rhythm.     Heart sounds: Normal heart sounds. No murmur heard.   Pulmonary:     Effort: Respiratory distress present.     Breath sounds: Normal breath sounds.     Comments: Agonal guppy respirations, tight, scant wheeze with expiration, minimal air movement Abdominal:     General: There is distension.     Palpations: Abdomen is soft.     Tenderness: There is no abdominal tenderness.  Musculoskeletal:     Cervical back: Neck supple.     Right lower leg: No edema.     Left lower leg: No edema.  Lymphadenopathy:     Cervical: No cervical adenopathy.  Skin:    General: Skin is warm and dry.  Neurological:     Comments: Patient appears to move all 4 extremities  Psychiatric:     Comments: Unable to assess     ED Results / Procedures / Treatments   Labs (all labs ordered are listed, but only abnormal results are displayed) Labs Reviewed  COMPREHENSIVE METABOLIC PANEL - Abnormal; Notable for the following components:      Result Value   Sodium 129 (*)    Chloride 75 (*)    CO2 47 (*)    Glucose, Bld 161 (*)    AST 44 (*)    All other components within normal limits  BRAIN NATRIURETIC PEPTIDE - Abnormal; Notable for the following components:   B Natriuretic Peptide 147.0 (*)    All other components within normal limits  LACTIC ACID, PLASMA - Abnormal; Notable for the following components:   Lactic Acid, Venous 2.0 (*)    All other components within normal  limits  URINALYSIS, ROUTINE W REFLEX MICROSCOPIC - Abnormal; Notable for the following components:   Hgb urine dipstick MODERATE (*)    All other components within normal limits  BLOOD GAS, ARTERIAL - Abnormal; Notable for the following components:   pCO2 arterial 71.4 (*)    pO2, Arterial 110 (*)    Bicarbonate 40.2 (*)    Acid-Base Excess 18.0 (*)    Allens test (pass/fail) NOT INDICATED (*)    All other components within normal limits  CULTURE, BLOOD (ROUTINE X 2)  CULTURE, BLOOD (ROUTINE X 2)  RESPIRATORY PANEL BY RT PCR (FLU A&B, COVID)  CBC WITH DIFFERENTIAL/PLATELET  LACTIC ACID, PLASMA  TROPONIN I (HIGH SENSITIVITY)  TROPONIN I (HIGH SENSITIVITY)    EKG ED ECG REPORT   Date: 09/29/2020  Rate:75  Rhythm: normal sinus rhythm  QRS Axis: normal  Intervals: normal  ST/T Wave abnormalities: normal  Conduction Disutrbances:none  Narrative Interpretation:   Old EKG Reviewed: none available  I have personally reviewed the EKG tracing and agree with the computerized printout as noted.   Radiology DG Chest Portable 1 View  Result Date: 09/29/2020 CLINICAL DATA:  Initial evaluation for intubation. EXAM: PORTABLE CHEST 1 VIEW COMPARISON:  Prior radiograph from  04/04/2020. FINDINGS: Endotracheal tube in place with tip position 4.5 cm above the carina. Enteric tube courses into the abdomen, tip overlying the proximal stomach, side hole at and/or near the level of the GE junction. Transverse heart size stable, and remains within normal limits. Mediastinal silhouette normal. Aortic atherosclerosis. Lungs are hyperinflated with changes of COPD/emphysema. Few scattered superimposed patchy opacities within the mid and lower lungs bilaterally could reflect atelectasis or infiltrates. No pulmonary edema or definite pleural effusion. No pneumothorax. Irregular nodular density overlying the peripheral left lung apex favored to be related to overlying EKG lead. No acute osseous finding.  IMPRESSION: 1. Tip of endotracheal tube 4.5 cm above the carina. 2. Enteric tube courses into the abdomen, tip overlying the proximal stomach, side hole at and/or near the level of the GE junction. 3. COPD. Superimposed patchy and streaky opacities within the mid and lower lungs could reflect atelectasis or possibly small infiltrates. Electronically Signed   By: Rise Mu M.D.   On: 09/29/2020 03:01    Procedures Procedure Name: Intubation Date/Time: 09/29/2020 2:35 AM Performed by: Shon Baton, MD Pre-anesthesia Checklist: Patient identified, Patient being monitored, Emergency Drugs available, Timeout performed and Suction available Oxygen Delivery Method: Non-rebreather mask Preoxygenation: Pre-oxygenation with 100% oxygen Induction Type: Rapid sequence Ventilation: Mask ventilation without difficulty Laryngoscope Size: Glidescope and 4 Grade View: Grade I Tube size: 7.5 mm Number of attempts: 1 Placement Confirmation: ETT inserted through vocal cords under direct vision,  CO2 detector and Breath sounds checked- equal and bilateral Secured at: 24 cm Tube secured with: ETT holder Dental Injury: Teeth and Oropharynx as per pre-operative assessment     .Critical Care Performed by: Shon Baton, MD Authorized by: Shon Baton, MD   Critical care provider statement:    Critical care time (minutes):  70   Critical care was necessary to treat or prevent imminent or life-threatening deterioration of the following conditions:  Respiratory failure   Critical care was time spent personally by me on the following activities:  Discussions with consultants, evaluation of patient's response to treatment, examination of patient, ordering and performing treatments and interventions, ordering and review of laboratory studies, ordering and review of radiographic studies, pulse oximetry, re-evaluation of patient's condition, obtaining history from patient or surrogate and  review of old charts   (including critical care time)  Medications Ordered in ED Medications  propofol (DIPRIVAN) 1000 MG/100ML infusion (40 mcg/kg/min  84.9 kg Intravenous Rate/Dose Change 09/29/20 0402)  fentaNYL in NS (64mcg/ml) infusion-PREMIX (100 mcg/hr Intravenous Rate/Dose Change 09/29/20 0336)  fentaNYL (SUBLIMAZE) bolus via infusion 50 mcg (has no administration in time range)  azithromycin (ZITHROMAX) 500 mg in sodium chloride 0.9 % 250 mL IVPB (has no administration in time range)  ipratropium (ATROVENT) 0.02 % nebulizer solution (0.5 mg  Given 09/29/20 0216)  albuterol (PROVENTIL) (2.5 MG/3ML) 0.083% nebulizer solution (5 mg  Given 09/29/20 0217)  etomidate (AMIDATE) injection 20 mg (20 mg Intravenous Given 09/29/20 0151)  succinylcholine (ANECTINE) injection 150 mg (150 mg Intravenous Given 09/29/20 0152)  fentaNYL (SUBLIMAZE) injection 50 mcg (50 mcg Intravenous Given 09/29/20 0238)  albuterol (PROVENTIL) (2.5 MG/3ML) 0.083% nebulizer solution (5 mg  Given 09/29/20 0250)  ipratropium-albuterol (DUONEB) 0.5-2.5 (3) MG/3ML nebulizer solution 3 mL (3 mLs Nebulization Given 09/29/20 0336)  cefTRIAXone (ROCEPHIN) 1 g in sodium chloride 0.9 % 100 mL IVPB (1 g Intravenous New Bag/Given 09/29/20 0334)  methylPREDNISolone sodium succinate (SOLU-MEDROL) 125 mg/2 mL injection 125 mg (125 mg Intravenous Given  09/29/20 0406)    ED Course  I have reviewed the triage vital signs and the nursing notes.  Pertinent labs & imaging results that were available during my care of the patient were reviewed by me and considered in my medical decision making (see chart for details).    MDM Rules/Calculators/A&P                          Patient presents in respiratory distress.  Hypoxic and altered.  Has a history of hypercarbic respiratory failure and COPD.  Has been hospitalized and intubated for the same.  Given his mental status and encephalopathy upon presentation, patient was emergently  intubated.  Testing sent.  He is afebrile.  He was given a DuoNeb and steroids given how tight he was on initial pulmonary exam.  Suspect recurrent COPD and recurrent hypercarbic respiratory failure.  Chest x-ray shows evidence of COPD and possible infiltrate.  Will cover with antibiotics given clinical presentation.  Patient was given Rocephin and azithromycin.  Blood cultures pending although he does not appear septic.  Lactate is 2.  No significant leukocytosis.  Troponin and EKG are reassuring without evidence of acute ischemia or arrhythmia.  He has mild hyponatremia and hypochloremia.  ABG shows a mixed metabolic alkalosis and respiratory acidosis.  CT scan of the head was obtained given altered mental status upon arrival and this does not show any evidence of bleed.  Given work-up and clinical picture, suspect COPD exacerbation with recurrent hypercarbic respiratory failure.  We will plan for admission to the ICU. Final Clinical Impression(s) / ED Diagnoses Final diagnoses:  Acute respiratory failure with hypercapnia (HCC)  COPD exacerbation (HCC)    Rx / DC Orders ED Discharge Orders    None       Hula Tasso, Mayer Masker, MD 09/29/20 0430

## 2020-09-29 NOTE — ED Notes (Signed)
Date and time results received: 09/29/20 0848 (use smartphrase ".now" to insert current time)  Test: phosphorus  Critical Value: <1  Name of Provider Notified: Sherryll Burger MD  Orders Received? Or Actions Taken?: n/a

## 2020-09-29 NOTE — ED Notes (Signed)
Increased RR to 26 to try to help match patient. And decreased his VT to 6cc (450). Will obtain an abg at 0800

## 2020-09-29 NOTE — ED Triage Notes (Signed)
[facility] - pt brought in for respiratory distress. Upon arrival pt unresponsive and being bagged. Pt's O2 78% on RA.

## 2020-09-29 NOTE — H&P (Signed)
History and Physical  Jeffrey [name] DGL:[id number] DOB: [birth year] DOA: 09/29/2020  Referring physician: Shon Baton, MD PCP: Gareth Morgan, MD  Patient coming from: Home  Chief Complaint: Respiratory distress  HPI: Jeffrey [name] is a 59 y.o. male with medical history significant for hypertension, hyperlipidemia, COPD (on 3-4 LPM of supplemental oxygen via [place] at baseline), CHF, GERD who presents to the emergency department via EMS due to respiratory distress.  Patient was unable to provide history due to being intubated and sedated.  History was obtained from ED physician and from ED medical record.  Per report, EMS was activated due to patient having shortness of breath, on arrival of the EMS, patient was noted to be in agonal breathing and was unresponsive after 1 hour of having respiratory difficulty.  They could not obtain a line and patient was assisted with bag ventilation with O2 sat at 85% arrival to the ED. Patient has had prior intubations due to acute hypercapnic respiratory failure secondary to COPD exacerbation.  ED Course:  In the emergency department, temperature was 97.3, who presented with altered mental status possibly due to hypoxia, so he was intubated and sedated.  Work-up in the ED showed normal CBC, hyponatremia, hyperglycemia, lactic acid 2.0.  ABG showed hypercapnia on FiO2 of 50%.  CT of head showed no acute intracranial abnormality.  Chest x-ray shows evidence of COPD and possible infiltrate, empiric IV antibiotics with Rocephin and azithromycin were given.  Hospitalist was asked to admit patient for further evaluation and management.  Review of Systems: This cannot be obtained at this time due to patient being intubated and sedated    Past Medical History:  Diagnosis Date  . Acute blood loss anemia 02/07/2017  . Anxiety   . Arthritis    deg disease, bulging disk,  shoulder level  . Bipolar disorder (HCC)   . Bipolar disorder (HCC)   . CHF  (congestive heart failure) (HCC)   . 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 Depression    anxiety  . Hyperlipidemia   . Hypertension   . Peptic ulcer disease    Review  . Pneumonia   . [name] (peripheral vascular disease) (HCC) 06/18/2015  . Shortness of breath    Past Surgical History:  Procedure Laterality Date  . BIOPSY  02/09/2017   Procedure: BIOPSY;  Surgeon: [name], MD;  Location: [facility];  Service: Endoscopy;;  duodenal gastric  . COLONOSCOPY  03/14/2007   [id number] colonoscopy and terminal ileoscopy except external hemorrhoids  . COLONOSCOPY N/A 05/05/2013   [name]: external/internal anal canal hemorrhoids, unable to intubate TI, segemental biopsies unremarkable   . COLONOSCOPY N/A 04/11/2017   Procedure: COLONOSCOPY;  Surgeon: [name], MD;  Location: [facility];  Service: Endoscopy;  Laterality: N/A;  . COLONOSCOPY WITH PROPOFOL N/A 03/31/2017   Procedure: COLONOSCOPY WITH PROPOFOL;  Surgeon: [name], MD;  Location: [facility];  Service: Endoscopy;  Laterality: N/A;  1:45pm  . ESOPHAGOGASTRODUODENOSCOPY  03/14/2007   [id number] antral gastritis with bulbar duodenitis/paucity to postbulbar duodenal folds and biopsy were benign with no evidence of villous atrophy.  . ESOPHAGOGASTRODUODENOSCOPY (EGD) WITH PROPOFOL N/A 02/09/2017   Procedure: ESOPHAGOGASTRODUODENOSCOPY (EGD) WITH PROPOFOL;  Surgeon: [name], MD;  Location: [facility];  Service: Endoscopy;  Laterality: N/A;  . GIVENS CAPSULE STUDY N/A 04/08/2017   Procedure: GIVENS CAPSULE STUDY;  Surgeon: [name], MD;  Location: [facility];  Service: Endoscopy;  Laterality: N/A;  .  HAND SURGERY     left, secondary to self-inflicted laceration  . HEMORRHOID SURGERY N/A 04/14/2017   Procedure: HEMORRHOIDECTOMY;  Surgeon: [name], MD;  Location: [facility];  Service: General;  Laterality: N/A;  . SHOULDER SURGERY     right  . TOE SURGERY     left great toe , amputated-lawnmover  accident    Social History:  reports that he quit smoking about a year ago. His smoking use included cigarettes. He started smoking about 49 years ago. He has a 40.00 pack-year smoking history. He quit smokeless tobacco use about a year ago. He reports previous drug use. Drug: Marijuana. He reports that he does not drink alcohol.   Allergies  Allergen Reactions  . Augmentin [Amoxicillin-Pot Clavulanate] Rash and Other (See Comments)    Tolerated Zosyn 12/27/2019 Has patient had a PCN reaction causing immediate rash, facial/tongue/throat swelling, SOB or lightheadedness with hypotension: No Has patient had a PCN reaction causing severe rash involving mucus membranes or skin necrosis: No Has patient had a PCN reaction that required hospitalization No Has patient had a PCN reaction occurring within the last 10 years: Yes If all of the above answers are "NO", then may proceed with Cephalosporin use.  Donivan Scull Inhibitors Hives    Family History  Problem Relation Age of Onset  . Breast cancer Mother        deceased  . Heart disease Father   . Depression Daughter   . Anxiety disorder Daughter   . Anxiety disorder Son   . Depression Son   . Asthma Brother   . Heart attack Maternal Aunt   . Heart attack Maternal Uncle   . Heart attack Paternal Aunt   . Heart attack Paternal Uncle   . Heart attack Maternal Grandmother   . Heart attack Maternal Grandfather   . Emphysema Maternal Grandfather   . Heart attack Paternal Grandmother   . Heart attack Paternal Grandfather   . Colon cancer Neg Hx   . Liver disease Neg Hx     Prior to Admission medications   Medication Sig Start Date End Date Taking? Authorizing Provider  acetaminophen (TYLENOL) 325 MG tablet Take 2 tablets (650 mg total) by mouth every 6 (six) hours as needed for mild pain, fever or headache (or Fever >/= 101). 01/10/20   Shon Hale, MD  albuterol (PROVENTIL) (2.5 MG/3ML) 0.083% nebulizer solution Take 3 mLs (2.5 mg total) by  nebulization every 4 (four) hours as needed for wheezing or shortness of breath. 03/29/20   Nyoka Cowden, MD  albuterol (VENTOLIN HFA) 108 (90 Base) MCG/ACT inhaler Inhale 2 puffs into the lungs every 4 (four) hours as needed for wheezing or shortness of breath. 12/04/19   Eber Hong, MD  aspirin EC 81 MG tablet Take 1 tablet (81 mg total) by mouth daily with breakfast. 01/10/20   Shon Hale, MD  atorvastatin (LIPITOR) 20 MG tablet Take 1 tablet (20 mg total) by mouth daily. For high cholesterol Patient taking differently: Take 20 mg by mouth every evening. For high cholesterol 02/09/18   Money, Gerlene Burdock, FNP  diltiazem (CARDIZEM CD) 180 MG 24 hr capsule Take 180 mg by mouth daily. 12/25/19   [provider]  divalproex (DEPAKOTE ER) 500 MG 24 hr tablet Take 3 tablets (1,500 mg total) by mouth at bedtime. 08/09/20   Myrlene Broker, MD  feeding supplement, ENSURE ENLIVE, (ENSURE ENLIVE) LIQD Take 237 mLs by mouth 3 (three) times daily between meals. 01/10/20  Shon Hale, MD  FLUoxetine (PROZAC) 40 MG capsule Take 1 capsule (40 mg total) by mouth every morning. For mood control 08/09/20   Myrlene Broker, MD  Fluticasone-Umeclidin-Vilant (TRELEGY ELLIPTA) 100-62.5-25 MCG/INH AEPB Inhale 1 puff into the lungs daily. 03/29/20   Nyoka Cowden, MD  folic acid (FOLVITE) 1 MG tablet Take 1 mg by mouth every morning.  11/29/19   [provider]  furosemide (LASIX) 40 MG tablet Take 40 mg by mouth daily. 12/25/19   [provider]  gabapentin (NEURONTIN) 300 MG capsule Take 2 capsules (600 mg total) by mouth 2 (two) times daily. 08/09/20   Myrlene Broker, MD  guaiFENesin (MUCINEX) 600 MG 12 hr tablet Take 1 tablet (600 mg total) by mouth 2 (two) times daily as needed for cough or to loosen phlegm. 01/10/20   Shon Hale, MD  LORazepam (ATIVAN) 1 MG tablet Take 1 tablet (1 mg total) by mouth 2 (two) times daily. 08/09/20   Myrlene Broker, MD  methocarbamol (ROBAXIN) 500 MG  tablet Take 500 mg by mouth every 8 (eight) hours as needed. 12/25/19   [provider]  metoprolol tartrate (LOPRESSOR) 25 MG tablet Take 0.5 tablets (12.5 mg total) by mouth 2 (two) times daily. 01/10/20   Shon Hale, MD  Multiple Vitamin (MULTIVITAMIN WITH MINERALS) TABS tablet Take 1 tablet by mouth daily. Patient taking differently: Take 1 tablet by mouth every morning.  01/13/19   Mikhail, Nita Sells, DO  OLANZapine (ZYPREXA) 10 MG tablet Take 1 tablet (10 mg total) by mouth 2 (two) times daily. 08/09/20 08/09/21  Myrlene Broker, MD  pantoprazole (PROTONIX) 20 MG tablet Take 20 mg by mouth every morning.  11/29/19   [provider]  predniSONE (DELTASONE) 10 MG tablet Take  4 each am x 2 days,   2 each am x 2 days,  1 each am x 2 days and stop 07/31/20   Nyoka Cowden, MD  traZODone (DESYREL) 50 MG tablet Take 1 tablet (50 mg total) by mouth at bedtime. 08/09/20   Myrlene Broker, MD  vitamin B-12 (CYANOCOBALAMIN) 500 MCG tablet Take 1,000 mcg by mouth every morning.     [provider]    Physical Exam: BP 97/63   Pulse 68   Temp (!) 97.5 F (36.4 C)   Resp 16   Ht 5\' 10"  (1.778 m)   Wt 84.9 kg   SpO2 98%   BMI 26.86 kg/m   . General: 59 y.o. year-old male well developed, intubated and sedated and in no acute distress.   Marland Kitchen HEENT: NCAT, PERRL . Neck: Supple, trachea medial . Cardiovascular: Regular rate and rhythm with no rubs or gallops.  No thyromegaly or JVD noted.  No lower extremity edema. 2/4 pulses in all 4 extremities. Marland Kitchen Respiratory: Decreased breath sounds bilaterally (R > L).   . Abdomen: Soft nontender nondistended with normal bowel sounds x4 quadrants. . Muskuloskeletal: No cyanosis, clubbing or edema noted bilaterally . Neuro: This cannot be assessed at this time due to patient being intubated and sedated skin: No ulcerative lesions noted or rashes Psychiatry: This cannot be assessed at this time due to patient being intubated and sedated          Labs on Admission:  Basic Metabolic Panel: Recent Labs  Lab 09/29/20 0220  NA 129*  K 3.5  CL 75*  CO2 47*  GLUCOSE 161*  BUN 8  CREATININE 0.73  CALCIUM 9.0   Liver Function  Tests: Recent Labs  Lab 09/29/20 0220  AST 44*  ALT 24  ALKPHOS 81  BILITOT 0.6  PROT 7.6  ALBUMIN 3.6   No results for input(s): LIPASE, AMYLASE in the last 168 hours. No results for input(s): AMMONIA in the last 168 hours. CBC: Recent Labs  Lab 09/29/20 0220  WBC 7.4  NEUTROABS 5.9  HGB 13.3  HCT 41.1  MCV 87.6  PLT 326   Cardiac Enzymes: No results for input(s): CKTOTAL, CKMB, CKMBINDEX, TROPONINI in the last 168 hours.  BNP (last 3 results) Recent Labs    12/24/19 1703 12/26/19 0403 09/29/20 0220  BNP 400.0* 187.0* 147.0*    ProBNP (last 3 results) No results for input(s): PROBNP in the last 8760 hours.  CBG: No results for input(s): GLUCAP in the last 168 hours.  Radiological Exams on Admission: CT Head Wo Contrast  Result Date: 09/29/2020 CLINICAL DATA:  Initial evaluation for acute mental status change. EXAM: CT HEAD WITHOUT CONTRAST TECHNIQUE: Contiguous axial images were obtained from the base of the skull through the vertex without intravenous contrast. COMPARISON:  Prior head CT from 12/24/2019. FINDINGS: Brain: Mild age-related cerebral atrophy with chronic small vessel ischemic disease, stable. No evidence for acute intracranial hemorrhage. No findings to suggest acute large vessel territory infarct. No mass lesion, midline shift, or mass effect. Ventricles are normal in size without evidence for hydrocephalus. No extra-axial fluid collection identified. Vascular: No hyperdense vessel identified. Skull: Scalp soft tissues demonstrate no acute abnormality. Calvarium intact. Sinuses/Orbits: Globes and orbital soft tissues demonstrate no acute finding. Remote fracture of the left lamina papyracea noted. Mild scattered mucosal thickening noted within the ethmoidal air  cells mastoid air cells are clear. Orogastric tube partially visualized coiled within the oropharynx. IMPRESSION: 1. No acute intracranial abnormality. 2. Mild age-related cerebral atrophy with chronic small vessel ischemic disease, stable. 3. Orogastric tube partially visualized coiled within the oropharynx. Repositioning suggested. Electronically Signed   By: Rise Mu M.D.   On: 09/29/2020 05:11   DG Chest Portable 1 View  Result Date: 09/29/2020 CLINICAL DATA:  Initial evaluation for intubation. EXAM: PORTABLE CHEST 1 VIEW COMPARISON:  Prior radiograph from 04/04/2020. FINDINGS: Endotracheal tube in place with tip position 4.5 cm above the carina. Enteric tube courses into the abdomen, tip overlying the proximal stomach, side hole at and/or near the level of the GE junction. Transverse heart size stable, and remains within normal limits. Mediastinal silhouette normal. Aortic atherosclerosis. Lungs are hyperinflated with changes of COPD/emphysema. Few scattered superimposed patchy opacities within the mid and lower lungs bilaterally could reflect atelectasis or infiltrates. No pulmonary edema or definite pleural effusion. No pneumothorax. Irregular nodular density overlying the peripheral left lung apex favored to be related to overlying EKG lead. No acute osseous finding. IMPRESSION: 1. Tip of endotracheal tube 4.5 cm above the carina. 2. Enteric tube courses into the abdomen, tip overlying the proximal stomach, side hole at and/or near the level of the GE junction. 3. COPD. Superimposed patchy and streaky opacities within the mid and lower lungs could reflect atelectasis or possibly small infiltrates. Electronically Signed   By: Rise Mu M.D.   On: 09/29/2020 03:01    EKG: I independently viewed the EKG done and my findings are as followed: EKG was not done in the ED  Assessment/Plan Present on Admission: . Acute on chronic respiratory failure with hypoxia and hypercapnia  (HCC) . Dyslipidemia . COPD exacerbation (HCC) . Essential hypertension . GERD (gastroesophageal reflux disease) . Acute metabolic  encephalopathy  Active Problems:   Dyslipidemia   COPD exacerbation (HCC)   Essential hypertension   GERD (gastroesophageal reflux disease)   Acute on chronic respiratory failure with hypoxia and hypercapnia (HCC)   Acute metabolic encephalopathy   Lactic acidosis   Hyponatremia   Hyperglycemia   Elevated brain natriuretic peptide (BNP) level   Acute on chronic respiratory failure with hypoxia and hypercapnia requiring IPPV secondary to acute exacerbation of COPD Acute metabolic encephalopathy in the setting of above Patient has had prior intubations in the past due to similar presentation Chest x-ray shows evidence of COPD and possible infiltrate ABG: pH 7.103, PCO2 71.4, PO2 110, bicarb 40.2 at FiO2 of 50% Empiric IV ceftriaxone and azithromycin was started due to presumed CAP (though patient has no elevated white blood cells and was afebrile), we shall continue with same at this time with plan to de-escalate on antibiotics based on procalcitonin levels.   Continue duo nebs, Mucinex, Solu-Medrol, azithromycin. Continue Protonix to prevent steroid-induced ulcer Continue incentive spirometry and flutter valve when patient is extubated and able to tolerate Patient is currently not requiring pressors, maintain MAP of 65 Repeat ABG later today and check chest x-ray and ABG tomorrow morning Continue IV sedation with fentanyl and propofol drip and adjust dosing accordingly Continue mechanical ventilation with plan to extubate patient as tolerated and transition to supplemental oxygen to maintain O2 sat > 92%   Lactic acidosis possibly secondary to hypoxia Lactic acid 2.0, continue to trend lactic acid  Hyponatremia Na 129, corrected sodium level due to hyperglycemia (BG 161) = 130 ; continue gentle hydration Continue to monitor sodium level with serial  BMPs Urine osmolality and urine sodium will be checked  Hyperglycemia possibly secondary to prednisone effect BG 161; continue to monitor blood glucose level with morning labs  Elevated BNP (chronic) BNP 147, this ranged within 1 87-400 within last night to 11 months Continue total input/output, daily weights and fluid restriction Last echocardiogram on 10/12/2019 showed LVEF of 65 to 70% with grade 1 diastolic dysfunction (impaired electrician)  Essential hypertension (controlled) BP meds will be held at this time due to soft BP  GERD Continue Protonix  Dyslipidemia Resume home meds when patient is extubated and able to tolerate oral intake  History of HFpEF Hold beta-blocker at this time due to soft BP Resume home meds when patient is extubated and able to tolerate oral intake   DVT prophylaxis: Lovenox  Code Status: Full code  Family Communication: None at bedside  Disposition Plan:  Patient is from:                        home Anticipated DC to:                   SNF or family members home Anticipated DC date:               2-3 days Anticipated DC barriers:          Patient is unstable to be discharged at this time due to being intubated and sedated secondary to acute on chronic respiratory failure with hypoxia and hypercapnia due to acute exertional COPD requiring inpatient management in ICU  Consults called: None  Admission status: Inpatient    Frankey Shown MD [facility]  09/29/2020, 6:22 AM

## 2020-09-29 NOTE — ED Notes (Signed)
Date and time results received: 09/29/20 0845 (use smartphrase ".now" to insert current time)  Test: lactic acid Critical Value: 2.5  Name of Provider Notified: Sherryll Burger MD   Orders Received? Or Actions Taken?: n/a

## 2020-09-29 NOTE — Progress Notes (Signed)
Per HPI: Jeffrey [name] is a 59 y.o. male with medical history significant for hypertension, hyperlipidemia, COPD (on 3-4 LPM of supplemental oxygen via [place] at baseline), CHF, GERD who presents to the emergency department via EMS due to respiratory distress.  Patient was unable to provide history due to being intubated and sedated.  History was obtained from ED physician and from ED medical record.  Per report, EMS was activated due to patient having shortness of breath, on arrival of the EMS, patient was noted to be in agonal breathing and was unresponsive after 1 hour of having respiratory difficulty.  They could not obtain a line and patient was assisted with bag ventilation with O2 sat at 85% arrival to the ED. Patient has had prior intubations due to acute hypercapnic respiratory failure secondary to COPD exacerbation.  ED Course:  In the emergency department, temperature was 97.3, who presented with altered mental status possibly due to hypoxia, so he was intubated and sedated.  Work-up in the ED showed normal CBC, hyponatremia, hyperglycemia, lactic acid 2.0.  ABG showed hypercapnia on FiO2 of 50%.  CT of head showed no acute intracranial abnormality.  Chest x-ray shows evidence of COPD and possible infiltrate, empiric IV antibiotics with Rocephin and azithromycin were given.  Hospitalist was asked to admit patient for further evaluation and management.  11/7: Patient admitted after midnight and examined at bedside this AM.  He remains intubated on mechanical ventilator with sedation ongoing.  He has been admitted with acute on chronic combined respiratory failure in the setting of acute COPD exacerbation with associated acute metabolic encephalopathy.  He is also noted to be hyponatremic with associated lactic acidosis.  Treatment plan to include Rocephin and azithromycin empirically for now despite lower procalcitonin levels.  Continue breathing treatments and steroids.  Appreciate PCCM evaluation  by a.m.  Discussed with ex-wife on phone 11/7.  Total care time: 30 minutes.

## 2020-09-29 NOTE — ED Notes (Signed)
Date and time results received: 09/29/20 4:23 AM(use smartphrase ".now" to insert current time)  Test: lactic acid Critical Value: 2.0  Name of Provider Notified: Dr Wilkie Aye  Orders Received? Or Actions Taken?: see chart

## 2020-09-29 NOTE — ED Notes (Signed)
7.5 ett in at 0153  + color change  24 @ lip

## 2020-09-29 NOTE — Progress Notes (Signed)
Pharmacy Antibiotic Note  Jeffrey [name] is a 59 y.o. male admitted on 09/29/2020 with sepsis.  Pharmacy has been consulted for vancomycin and Zosyn  dosing.  Plan: Zosyn  3.375g IV x1 dose over 30 minutes, then Zosyn 3.375g IV q8h (4-hr inf) Give vancomycin 2g IV x1 dose, then vancomycin 1g IV q12h Goal vancomycin trough range: 15-20  mcg/mL Pharmacy will continue to monitor renal function, vancomycin troughs as clinically appropriate,  cultures and patient progress.   Height: 5\' 10"  (177.8 cm) Weight: 88.8 kg (195 lb 12.3 oz) IBW/kg (Calculated) : 73  Temp (24hrs), Avg:97.6 F (36.4 C), Min:96.6 F (35.9 C), Max:98.8 F (37.1 C)  Recent Labs  Lab 09/29/20 0220 09/29/20 0325 09/29/20 0732 09/29/20 1221  WBC 7.4  --  7.4  --   CREATININE 0.73  --   --  0.64  LATICACIDVEN  --  2.0* 2.5* 2.9*    Estimated Creatinine Clearance: 110.1 mL/min (by C-G formula based on SCr of 0.64 mg/dL).    Allergies  Allergen Reactions  . Augmentin [Amoxicillin-Pot Clavulanate] Rash and Other (See Comments)    Tolerated Zosyn 12/27/2019 Has patient had a PCN reaction causing immediate rash, facial/tongue/throat swelling, SOB or lightheadedness with hypotension: No Has patient had a PCN reaction causing severe rash involving mucus membranes or skin necrosis: No Has patient had a PCN reaction that required hospitalization No Has patient had a PCN reaction occurring within the last 10 years: Yes If all of the above answers are "NO", then may proceed with Cephalosporin use.  . Ace Inhibitors Hives    Antimicrobials this admission: ceftriaxone 11/7> x1 dose vancomycin 11/7 >>   Zosyn 11/7>>   Microbiology results: 11/7  BC x2: NG <12h 11/7 Resp PCR: SARS CoV-2 negative; Flu A/B negative 11/7  MRSA PCR:   Thank you for allowing pharmacy to be a part of this patient's care.  13/7 09/29/2020 2:49 PM

## 2020-09-30 ENCOUNTER — Inpatient Hospital Stay (HOSPITAL_COMMUNITY): Payer: Medicare Other

## 2020-09-30 DIAGNOSIS — J9621 Acute and chronic respiratory failure with hypoxia: Secondary | ICD-10-CM

## 2020-09-30 DIAGNOSIS — J441 Chronic obstructive pulmonary disease with (acute) exacerbation: Secondary | ICD-10-CM | POA: Diagnosis not present

## 2020-09-30 DIAGNOSIS — J9622 Acute and chronic respiratory failure with hypercapnia: Secondary | ICD-10-CM | POA: Diagnosis not present

## 2020-09-30 LAB — COMPREHENSIVE METABOLIC PANEL
ALT: 16 U/L (ref 0–44)
AST: 24 U/L (ref 15–41)
Albumin: 2.5 g/dL — ABNORMAL LOW (ref 3.5–5.0)
Alkaline Phosphatase: 50 U/L (ref 38–126)
Anion gap: 8 (ref 5–15)
BUN: 14 mg/dL (ref 6–20)
CO2: 35 mmol/L — ABNORMAL HIGH (ref 22–32)
Calcium: 8.3 mg/dL — ABNORMAL LOW (ref 8.9–10.3)
Chloride: 90 mmol/L — ABNORMAL LOW (ref 98–111)
Creatinine, Ser: 0.62 mg/dL (ref 0.61–1.24)
GFR, Estimated: >60 mL/min (ref >60)
Glucose, Bld: 115 mg/dL — ABNORMAL HIGH (ref 70–99)
Potassium: 3.4 mmol/L — ABNORMAL LOW (ref 3.5–5.1)
Sodium: 133 mmol/L — ABNORMAL LOW (ref 135–145)
Total Bilirubin: 0.5 mg/dL (ref 0.3–1.2)
Total Protein: 5.5 g/dL — ABNORMAL LOW (ref 6.5–8.1)

## 2020-09-30 LAB — BLOOD GAS, ARTERIAL
Acid-Base Excess: 11.1 mmol/L — ABNORMAL HIGH (ref 0.0–2.0)
Bicarbonate: 33.8 mmol/L — ABNORMAL HIGH (ref 20.0–28.0)
FIO2: 40
O2 Saturation: 95.6 %
Patient temperature: 37.3
pCO2 arterial: 61.5 mmHg — ABNORMAL HIGH (ref 32.0–48.0)
pH, Arterial: 7.391 (ref 7.350–7.450)
pO2, Arterial: 80.9 mmHg — ABNORMAL LOW (ref 83.0–108.0)

## 2020-09-30 LAB — LACTIC ACID, PLASMA: Lactic Acid, Venous: 1.5 mmol/L (ref 0.5–1.9)

## 2020-09-30 LAB — GLUCOSE, CAPILLARY
Glucose-Capillary: 101 mg/dL — ABNORMAL HIGH (ref 70–99)
Glucose-Capillary: 112 mg/dL — ABNORMAL HIGH (ref 70–99)
Glucose-Capillary: 118 mg/dL — ABNORMAL HIGH (ref 70–99)
Glucose-Capillary: 120 mg/dL — ABNORMAL HIGH (ref 70–99)
Glucose-Capillary: 135 mg/dL — ABNORMAL HIGH (ref 70–99)
Glucose-Capillary: 136 mg/dL — ABNORMAL HIGH (ref 70–99)

## 2020-09-30 LAB — MRSA PCR SCREENING: MRSA by PCR: NEGATIVE

## 2020-09-30 LAB — CBC
HCT: 30.4 % — ABNORMAL LOW (ref 39.0–52.0)
Hemoglobin: 9.8 g/dL — ABNORMAL LOW (ref 13.0–17.0)
MCH: 28.2 pg (ref 26.0–34.0)
MCHC: 32.2 g/dL (ref 30.0–36.0)
MCV: 87.6 fL (ref 80.0–100.0)
Platelets: 263 10*3/uL (ref 150–400)
RBC: 3.47 MIL/uL — ABNORMAL LOW (ref 4.22–5.81)
RDW: 13.8 % (ref 11.5–15.5)
WBC: 6.6 10*3/uL (ref 4.0–10.5)
nRBC: 0 % (ref 0.0–0.2)

## 2020-09-30 LAB — PROTIME-INR
INR: 1 (ref 0.8–1.2)
Prothrombin Time: 12.9 seconds (ref 11.4–15.2)

## 2020-09-30 LAB — APTT: aPTT: 31 seconds (ref 24–36)

## 2020-09-30 MED ORDER — TRAZODONE HCL 50 MG PO TABS
50.0000 mg | ORAL_TABLET | Freq: Every day | ORAL | Status: DC
  Administered 2020-09-30 – 2020-10-01 (×2): 50 mg
  Filled 2020-09-30 (×2): qty 1

## 2020-09-30 MED ORDER — PROSOURCE TF PO LIQD
45.0000 mL | Freq: Two times a day (BID) | ORAL | Status: DC
  Administered 2020-09-30 – 2020-10-01 (×3): 45 mL
  Filled 2020-09-30 (×3): qty 45

## 2020-09-30 MED ORDER — FOLIC ACID 1 MG PO TABS
1.0000 mg | ORAL_TABLET | Freq: Every morning | ORAL | Status: DC
  Administered 2020-09-30 – 2020-10-02 (×3): 1 mg
  Filled 2020-09-30 (×4): qty 1

## 2020-09-30 MED ORDER — FENTANYL BOLUS VIA INFUSION
50.0000 ug | INTRAVENOUS | Status: DC | PRN
  Administered 2020-09-30 – 2020-10-02 (×6): 50 ug via INTRAVENOUS
  Filled 2020-09-30: qty 50

## 2020-09-30 MED ORDER — METOPROLOL TARTRATE 25 MG PO TABS: 12.5000 mg | ORAL_TABLET | Freq: Two times a day (BID) | ORAL | Status: DC

## 2020-09-30 MED ORDER — VITAL HIGH PROTEIN PO LIQD
1000.0000 mL | ORAL | Status: DC
  Administered 2020-09-30: 1000 mL

## 2020-09-30 MED ORDER — ATORVASTATIN CALCIUM 20 MG PO TABS
20.0000 mg | ORAL_TABLET | Freq: Every evening | ORAL | Status: DC
  Administered 2020-09-30 – 2020-10-01 (×2): 20 mg via ORAL
  Filled 2020-09-30 (×2): qty 1

## 2020-09-30 MED ORDER — GABAPENTIN 300 MG PO CAPS: 600.0000 mg | ORAL_CAPSULE | Freq: Two times a day (BID) | ORAL | Status: DC

## 2020-09-30 MED ORDER — OLANZAPINE 5 MG PO TABS
10.0000 mg | ORAL_TABLET | Freq: Every day | ORAL | Status: DC
  Administered 2020-09-30 – 2020-10-01 (×2): 10 mg
  Filled 2020-09-30 (×2): qty 2

## 2020-09-30 MED ORDER — METOPROLOL TARTRATE 25 MG PO TABS
12.5000 mg | ORAL_TABLET | Freq: Two times a day (BID) | ORAL | Status: DC
  Administered 2020-09-30 – 2020-10-02 (×5): 12.5 mg
  Filled 2020-09-30 (×5): qty 1

## 2020-09-30 MED ORDER — VITAMIN B-12 1000 MCG PO TABS: 1000.0000 ug | ORAL_TABLET | Freq: Every morning | ORAL | Status: DC

## 2020-09-30 MED ORDER — VALPROIC ACID 250 MG/5ML PO SOLN
500.0000 mg | Freq: Three times a day (TID) | ORAL | Status: DC
  Administered 2020-09-30 – 2020-10-02 (×6): 500 mg
  Filled 2020-09-30 (×14): qty 10

## 2020-09-30 MED ORDER — GABAPENTIN 300 MG PO CAPS
600.0000 mg | ORAL_CAPSULE | Freq: Two times a day (BID) | ORAL | Status: DC
  Administered 2020-09-30 – 2020-10-02 (×5): 600 mg
  Filled 2020-09-30 (×5): qty 2

## 2020-09-30 MED ORDER — DIVALPROEX SODIUM 250 MG PO DR TAB
500.0000 mg | DELAYED_RELEASE_TABLET | Freq: Three times a day (TID) | ORAL | Status: DC
  Filled 2020-09-30: qty 2

## 2020-09-30 MED ORDER — POTASSIUM CHLORIDE 10 MEQ/100ML IV SOLN
10.0000 meq | INTRAVENOUS | Status: AC
  Administered 2020-09-30 (×4): 10 meq via INTRAVENOUS
  Filled 2020-09-30 (×3): qty 100

## 2020-09-30 MED ORDER — METHOCARBAMOL 500 MG PO TABS: 500.0000 mg | ORAL_TABLET | Freq: Three times a day (TID) | ORAL | Status: DC | PRN

## 2020-09-30 MED ORDER — FENTANYL 2500MCG IN NS 250ML (10MCG/ML) PREMIX INFUSION
50.0000 ug/h | INTRAVENOUS | Status: DC
  Administered 2020-09-30 – 2020-10-01 (×2): 200 ug/h via INTRAVENOUS
  Administered 2020-10-01: 150 ug/h via INTRAVENOUS
  Filled 2020-09-30 (×3): qty 250

## 2020-09-30 MED ORDER — FOLIC ACID 1 MG PO TABS: 1.0000 mg | ORAL_TABLET | Freq: Every morning | ORAL | Status: DC

## 2020-09-30 MED ORDER — PANTOPRAZOLE SODIUM 40 MG PO PACK
40.0000 mg | PACK | Freq: Every day | ORAL | Status: DC
  Administered 2020-10-01 – 2020-10-02 (×2): 40 mg
  Filled 2020-09-30 (×2): qty 20

## 2020-09-30 MED ORDER — FLUOXETINE HCL 40 MG PO CAPS: 40.0000 mg | ORAL_CAPSULE | Freq: Every morning | ORAL | Status: DC

## 2020-09-30 MED ORDER — DOCUSATE SODIUM 50 MG/5ML PO LIQD: 100.0000 mg | Freq: Every day | ORAL | Status: DC | PRN

## 2020-09-30 MED ORDER — FLUOXETINE HCL 20 MG PO CAPS
40.0000 mg | ORAL_CAPSULE | Freq: Every morning | ORAL | Status: DC
  Administered 2020-09-30 – 2020-10-02 (×3): 40 mg
  Filled 2020-09-30 (×3): qty 2

## 2020-09-30 MED ORDER — VITAL HIGH PROTEIN PO LIQD: 1000.0000 mL | ORAL | Status: DC

## 2020-09-30 MED ORDER — VITAMIN B-12 1000 MCG PO TABS
1000.0000 ug | ORAL_TABLET | Freq: Every morning | ORAL | Status: DC
  Administered 2020-09-30 – 2020-10-02 (×3): 1000 ug
  Filled 2020-09-30 (×3): qty 1

## 2020-09-30 MED ORDER — MIDAZOLAM HCL 2 MG/2ML IJ SOLN
2.0000 mg | INTRAMUSCULAR | Status: DC | PRN
  Administered 2020-09-30 – 2020-10-02 (×2): 2 mg via INTRAVENOUS
  Filled 2020-09-30 (×2): qty 2

## 2020-09-30 MED ORDER — POLYETHYLENE GLYCOL 3350 17 G PO PACK: 17.0000 g | PACK | Freq: Every day | ORAL | Status: DC | PRN

## 2020-09-30 MED ORDER — ASPIRIN EC 81 MG PO TBEC: 81.0000 mg | DELAYED_RELEASE_TABLET | Freq: Every day | ORAL | Status: DC

## 2020-09-30 MED ORDER — PROPOFOL 1000 MG/100ML IV EMUL
0.0000 ug/kg/min | INTRAVENOUS | Status: DC
  Administered 2020-09-30 (×2): 50 ug/kg/min via INTRAVENOUS
  Administered 2020-09-30: 30 ug/kg/min via INTRAVENOUS
  Administered 2020-10-01 (×2): 40 ug/kg/min via INTRAVENOUS
  Administered 2020-10-01: 50 ug/kg/min via INTRAVENOUS
  Administered 2020-10-01: 40 ug/kg/min via INTRAVENOUS
  Administered 2020-10-01 (×2): 50 ug/kg/min via INTRAVENOUS
  Administered 2020-10-02: 40 ug/kg/min via INTRAVENOUS
  Administered 2020-10-02: 20 ug/kg/min via INTRAVENOUS
  Filled 2020-09-30: qty 300
  Filled 2020-09-30 (×4): qty 100
  Filled 2020-09-30: qty 200
  Filled 2020-09-30 (×3): qty 100

## 2020-09-30 MED ORDER — OLANZAPINE 5 MG PO TABS: 10.0000 mg | ORAL_TABLET | Freq: Two times a day (BID) | ORAL | Status: DC

## 2020-09-30 MED ORDER — BISACODYL 10 MG RE SUPP
10.0000 mg | Freq: Once | RECTAL | Status: AC
  Administered 2020-09-30: 10 mg via RECTAL
  Filled 2020-09-30: qty 1

## 2020-09-30 MED ORDER — TRAZODONE HCL 50 MG PO TABS: 50.0000 mg | ORAL_TABLET | Freq: Every day | ORAL | Status: DC

## 2020-09-30 NOTE — Consult Note (Signed)
NAME:  Jeffrey [name], MRN:  [id number], DOB:  [birth year], LOS: 1 ADMISSION DATE:  09/29/2020, CONSULTATION DATE: 09/30/20 REFERRING MD:  Dr. Maurilio LovelyPratik Shah, [facility], CHIEF COMPLAINT:  Respiratory failure   Brief History   59 yo male former smoker with hx of COPD on 3 to 4 liters oxygen at home brought to ER in respiratory distress with altered mental status.  SpO2 78% on room air.  He required intubation in the ER.  He is followed in pulmonary office by Dr. Sherene SiresWert.    Hx from chart and medical team.  Past Medical History  COPD, Bipolar, Diastolic CHF, Hepatic encephalopathy, Depression, Polysubstance abuse, [name], ETOH, GI bleed, HTN, GERD  Significant Hospital Events   11/07 Admit  Consults:    Procedures:  ETT 11/07 >>  Significant Diagnostic Tests:   PFT 10/09/16 >> 1.84 (49%), FEV1% 48, TLC 7.39 (105%), RV 3.59 (166%), DLCO 36%  A1AT 10/09/16 >> 215, MM  Echo 10/12/19 >> EF 65 to 70%, grade 1 DD, mild/mod LA dilation, mild MR, mild/mod AR  Micro Data:  COVID/Flu 09/29/20 >> negative MRSA PCR 09/29/20 >> negative Blood 09/29/20 >>   Antimicrobials:  Zithromax 11/06  Rocephin 11/06  Vancomycin 11/07  Zosyn 11/07 >>   Interim history/subjective:  Low Vt, RR, and oxygen desaturation with SBT.  Objective   Blood pressure (!) 115/93, pulse (!) 106, temperature 98.4 F (36.9 C), resp. rate 19, height 5\' 10"  (1.778 m), weight 86.3 kg, SpO2 92 %.    Vent Mode: PRVC FiO2 (%):  [40 %] 40 % Set Rate:  [16 bmp-26 bmp] 16 bmp Vt Set:  [450 mL-550 mL] 550 mL PEEP:  [5 cmH20] 5 cmH20 Plateau Pressure:  [11 cmH20-25 cmH20] 19 cmH20   Intake/Output Summary (Last 24 hours) at 09/30/2020 1248 Last data filed at 09/30/2020 1038 Gross per 24 hour  Intake 3992.58 ml  Output 700 ml  Net 3292.58 ml   Filed Weights   09/29/20 0211 09/29/20 1413 09/30/20 0500  Weight: 84.9 kg 88.8 kg 86.3 kg    Examination:  General - sedated Eyes - pupils reactive ENT - ETT in place Cardiac -  regular rate/rhythm, no murmur Chest - coarse breath sounds b/l with faint wheezing Abdomen - soft, non tender, + bowel sounds Extremities - no cyanosis, clubbing, or edema Skin - no rashes Neuro - [facility] -1[facility]   Resolved Hospital Problem list   Lactic acidosis from hypoxia  Assessment & Plan:   Acute on chronic hypoxic/hypercapnic respiratory failure from COPD exacerbation. - day 3 of ABx - continue solumedrol - scheduled BDs - goal SpO2 90 to 95% - change vent settings to avoid PEEPi - not ready for extubation trial yet - f/u CXR 11/09  Acute metabolic encephalopathy from hypoxia. Hx of polysubstance abuse, ETOH, depression, bipolar. - diprivan/fentanyl gtt with prn versed for RASS goal 0 to -1 - resume outpt depakote, prozac, neurontin, zyprexa, trazodone  Hypokalemia, hypophosphatemia. - f/u electrolytes  Anemia of critical illness and chronic disease. - f/u CBC - transfuse for Hb < 7 or significant bleeding  Hx of HTN, [name], Chronic diastolic CHF. - resume outpt ASA, lipitor, lopressor - hold outpt cardizem  Best practice:  Diet: tube feeds once OG tube placed DVT prophylaxis: Lovenox GI prophylaxis: Protonix Mobility: bed rest Code Status: full code Disposition: ICU  Labs   CBC: Recent Labs  Lab 09/29/20 0220 09/29/20 0732 09/30/20 0453  WBC 7.4 7.4 6.6  NEUTROABS 5.9  --   --  HGB 13.3 11.2* 9.8*  HCT 41.1 35.4* 30.4*  MCV 87.6 86.6 87.6  PLT 326 290 263    Basic Metabolic Panel: Recent Labs  Lab 09/29/20 0220 09/29/20 0732 09/29/20 1221 09/30/20 0453  NA 129*  --  130* 133*  K 3.5  --  3.4* 3.4*  CL 75*  --  80* 90*  CO2 47*  --  40* 35*  GLUCOSE 161*  --  118* 115*  BUN 8  --  10 14  CREATININE 0.73  --  0.64 0.62  CALCIUM 9.0  --  8.8* 8.3*  MG  --  1.8  --   --   PHOS  --  <1.0*  --   --    GFR: Estimated Creatinine Clearance: 101.4 mL/min (by C-G formula based on SCr of 0.62 mg/dL). Recent Labs  Lab  09/29/20 0220 09/29/20 0325 09/29/20 0732 09/29/20 0735 09/29/20 1221 09/30/20 0453 09/30/20 0459  PROCALCITON  --   --   --  0.13  --   --   --   WBC 7.4  --  7.4  --   --  6.6  --   LATICACIDVEN  --  2.0* 2.5*  --  2.9*  --  1.5    Liver Function Tests: Recent Labs  Lab 09/29/20 0220 09/30/20 0453  AST 44* 24  ALT 24 16  ALKPHOS 81 50  BILITOT 0.6 0.5  PROT 7.6 5.5*  ALBUMIN 3.6 2.5*   No results for input(s): LIPASE, AMYLASE in the last 168 hours. No results for input(s): AMMONIA in the last 168 hours.  ABG    Component Value Date/Time   PHART 7.391 09/30/2020 0320   PCO2ART 61.5 (H) 09/30/2020 0320   PO2ART 80.9 (L) 09/30/2020 0320   HCO3 33.8 (H) 09/30/2020 0320   TCO2 34 (H) 12/21/2018 0357   ACIDBASEDEF 0.6 02/17/2019 1850   O2SAT 95.6 09/30/2020 0320     Coagulation Profile: Recent Labs  Lab 09/30/20 0453  INR 1.0    Cardiac Enzymes: No results for input(s): CKTOTAL, CKMB, CKMBINDEX, TROPONINI in the last 168 hours.  HbA1C: No results found for: HGBA1C  CBG: Recent Labs  Lab 09/29/20 1950 09/30/20 0047 09/30/20 0423 09/30/20 0726 09/30/20 1111  GLUCAP 118* 118* 101* 120* 136*    Review of Systems:   Unable to obtain.  Past Medical History  He,  has a past medical history of Acute blood loss anemia (02/07/2017), Anxiety, Arthritis, Bipolar disorder (HCC), Bipolar disorder (HCC), CHF (congestive heart failure) (HCC), COPD, severe (HCC) (10/09/2016), Depression, Hyperlipidemia, Hypertension, Peptic ulcer disease, Pneumonia, [name] (peripheral vascular disease) (HCC) (06/18/2015), and Shortness of breath.   Surgical History    Past Surgical History:  Procedure Laterality Date  . BIOPSY  02/09/2017   Procedure: BIOPSY;  Surgeon: [name], MD;  Location: [facility];  Service: Endoscopy;;  duodenal gastric  . COLONOSCOPY  03/14/2007   GYB:WLSLHT colonoscopy and terminal ileoscopy except external hemorrhoids  . COLONOSCOPY N/A  05/05/2013   [name]: external/internal anal canal hemorrhoids, unable to intubate TI, segemental biopsies unremarkable   . COLONOSCOPY N/A 04/11/2017   Procedure: COLONOSCOPY;  Surgeon: [name], MD;  Location: [facility];  Service: Endoscopy;  Laterality: N/A;  . COLONOSCOPY WITH PROPOFOL N/A 03/31/2017   Procedure: COLONOSCOPY WITH PROPOFOL;  Surgeon: [name], MD;  Location: [facility];  Service: Endoscopy;  Laterality: N/A;  1:45pm  . ESOPHAGOGASTRODUODENOSCOPY  03/14/2007   DSK:AJGOTLXBWI antral gastritis  with bulbar duodenitis/paucity to postbulbar duodenal folds and biopsy were benign with no evidence of villous atrophy.  . ESOPHAGOGASTRODUODENOSCOPY (EGD) WITH PROPOFOL N/A 02/09/2017   Procedure: ESOPHAGOGASTRODUODENOSCOPY (EGD) WITH PROPOFOL;  Surgeon: [name], MD;  Location: [facility];  Service: Endoscopy;  Laterality: N/A;  . GIVENS CAPSULE STUDY N/A 04/08/2017   Procedure: GIVENS CAPSULE STUDY;  Surgeon: [name], MD;  Location: [facility];  Service: Endoscopy;  Laterality: N/A;  . HAND SURGERY     left, secondary to self-inflicted laceration  . HEMORRHOID SURGERY N/A 04/14/2017   Procedure: HEMORRHOIDECTOMY;  Surgeon: [name], MD;  Location: [facility];  Service: General;  Laterality: N/A;  . SHOULDER SURGERY     right  . TOE SURGERY     left great toe , amputated-lawnmover accident     Social History   reports that he quit smoking about a year ago. His smoking use included cigarettes. He started smoking about 49 years ago. He has a 40.00 pack-year smoking history. He quit smokeless tobacco use about a year ago. He reports previous drug use. Drug: Marijuana. He reports that he does not drink alcohol.   Family History   His family history includes Anxiety disorder in his daughter and son; Asthma in his brother; Breast cancer in his mother; Depression in his daughter and son; Emphysema in his maternal grandfather; Heart attack in his  maternal aunt, maternal grandfather, maternal grandmother, maternal uncle, paternal aunt, paternal grandfather, paternal grandmother, and paternal uncle; Heart disease in his father. There is no history of Colon cancer or Liver disease.   Allergies Allergies  Allergen Reactions  . Augmentin [Amoxicillin-Pot Clavulanate] Rash and Other (See Comments)    Tolerated Zosyn 12/27/2019 Has patient had a PCN reaction causing immediate rash, facial/tongue/throat swelling, SOB or lightheadedness with hypotension: No Has patient had a PCN reaction causing severe rash involving mucus membranes or skin necrosis: No Has patient had a PCN reaction that required hospitalization No Has patient had a PCN reaction occurring within the last 10 years: Yes If all of the above answers are "NO", then may proceed with Cephalosporin use.  Donivan Scull Inhibitors Hives     Home Medications  Prior to Admission medications   Medication Sig Start Date End Date Taking? Authorizing Provider  acetaminophen (TYLENOL) 325 MG tablet Take 2 tablets (650 mg total) by mouth every 6 (six) hours as needed for mild pain, fever or headache (or Fever >/= 101). 01/10/20   Shon Hale, MD  albuterol (PROVENTIL) (2.5 MG/3ML) 0.083% nebulizer solution Take 3 mLs (2.5 mg total) by nebulization every 4 (four) hours as needed for wheezing or shortness of breath. 03/29/20   Nyoka Cowden, MD  albuterol (VENTOLIN HFA) 108 (90 Base) MCG/ACT inhaler Inhale 2 puffs into the lungs every 4 (four) hours as needed for wheezing or shortness of breath. 12/04/19   Eber Hong, MD  aspirin EC 81 MG tablet Take 1 tablet (81 mg total) by mouth daily with breakfast. 01/10/20   Shon Hale, MD  atorvastatin (LIPITOR) 20 MG tablet Take 1 tablet (20 mg total) by mouth daily. For high cholesterol Patient taking differently: Take 20 mg by mouth every evening. For high cholesterol 02/09/18   Money, Gerlene Burdock, FNP  diltiazem (CARDIZEM CD) 180 MG 24 hr capsule Take  180 mg by mouth daily. 12/25/19   [provider]  divalproex (DEPAKOTE ER) 500 MG 24 hr tablet Take 3 tablets (1,500 mg total) by mouth at bedtime.  08/09/20   Myrlene Broker, MD  feeding supplement, ENSURE ENLIVE, (ENSURE ENLIVE) LIQD Take 237 mLs by mouth 3 (three) times daily between meals. 01/10/20   Shon Hale, MD  FLUoxetine (PROZAC) 40 MG capsule Take 1 capsule (40 mg total) by mouth every morning. For mood control 08/09/20   Myrlene Broker, MD  Fluticasone-Umeclidin-Vilant (TRELEGY ELLIPTA) 100-62.5-25 MCG/INH AEPB Inhale 1 puff into the lungs daily. 03/29/20   Nyoka Cowden, MD  folic acid (FOLVITE) 1 MG tablet Take 1 mg by mouth every morning.  11/29/19   [provider]  furosemide (LASIX) 40 MG tablet Take 40 mg by mouth daily. 12/25/19   [provider]  gabapentin (NEURONTIN) 300 MG capsule Take 2 capsules (600 mg total) by mouth 2 (two) times daily. 08/09/20   Myrlene Broker, MD  guaiFENesin (MUCINEX) 600 MG 12 hr tablet Take 1 tablet (600 mg total) by mouth 2 (two) times daily as needed for cough or to loosen phlegm. 01/10/20   Shon Hale, MD  LORazepam (ATIVAN) 1 MG tablet Take 1 tablet (1 mg total) by mouth 2 (two) times daily. 08/09/20   Myrlene Broker, MD  methocarbamol (ROBAXIN) 500 MG tablet Take 500 mg by mouth every 8 (eight) hours as needed. 12/25/19   [provider]  metoprolol tartrate (LOPRESSOR) 25 MG tablet Take 0.5 tablets (12.5 mg total) by mouth 2 (two) times daily. 01/10/20   Shon Hale, MD  Multiple Vitamin (MULTIVITAMIN WITH MINERALS) TABS tablet Take 1 tablet by mouth daily. Patient taking differently: Take 1 tablet by mouth every morning.  01/13/19   Mikhail, Nita Sells, DO  OLANZapine (ZYPREXA) 10 MG tablet Take 1 tablet (10 mg total) by mouth 2 (two) times daily. 08/09/20 08/09/21  Myrlene Broker, MD  pantoprazole (PROTONIX) 20 MG tablet Take 20 mg by mouth every morning.  11/29/19   [provider]  predniSONE  (DELTASONE) 10 MG tablet Take  4 each am x 2 days,   2 each am x 2 days,  1 each am x 2 days and stop 07/31/20   Nyoka Cowden, MD  traZODone (DESYREL) 50 MG tablet Take 1 tablet (50 mg total) by mouth at bedtime. 08/09/20   Myrlene Broker, MD  vitamin B-12 (CYANOCOBALAMIN) 500 MCG tablet Take 1,000 mcg by mouth every morning.     [provider]     Critical care time: 43 minutes  Coralyn Helling, MD [facility] Pulmonary/Critical Care Pager - [phone] 09/30/2020, 12:54 PM

## 2020-09-30 NOTE — Progress Notes (Signed)
PROGRESS NOTE    ANDREN WAITKUS  ZOX:[id number] DOB: [birth year] DOA: 09/29/2020 PCP: Gareth Morgan, MD   Brief Narrative:  Per HPI: Jeffrey Albee [name] a 59 y.o.malewith medical history significant forhypertension, hyperlipidemia, COPD(on 3-4 LPM of supplemental oxygen via [place] at baseline), CHF, GERDwho presents to the emergency department via EMS due torespiratory distress. Patient was unable to provide history due to being intubated and sedated. History was obtained from ED physician and from ED medical record. Per report, EMS was activated due to patient having shortness of breath, on arrival of the EMS, patient was noted to be in agonal breathing and was unresponsive after 1 hour of having respiratory difficulty. They could not obtain a lineand patient was assisted with bag ventilation with O2 sat at 85% arrival to the ED. Patient has had prior intubations due to acute hypercapnic respiratory failure secondary to COPD exacerbation.  ED Course: In the emergency department, temperature was 97.3, who presented with altered mental status possibly due to hypoxia, sohewas intubated and sedated. Work-up in the ED showed normal CBC, hyponatremia, hyperglycemia, lactic acid 2.0. ABG showed hypercapnia on FiO2 of 50%. CT of head showed no acute intracranialabnormality. Chest x-ray shows evidence of COPD and possible infiltrate, empiric IV antibiotics with Rocephin and azithromycin were given. Hospitalist was asked to admit patient for further evaluation and management.  -Patient admitted with acute on chronic combined respiratory failure in the setting of COPD exacerbation with associated acute metabolic encephalopathy.  He was noted to have hyponatremia and lactic acidosis which have been improving with IV fluid administration antibiotics.  He currently remains on IV Zosyn after vancomycin has been discontinued.  PCCM consultation appreciated.  Assessment & Plan:   Active  Problems:   Dyslipidemia   COPD exacerbation (HCC)   Essential hypertension   GERD (gastroesophageal reflux disease)   Acute on chronic respiratory failure with hypoxia and hypercapnia (HCC)   Acute metabolic encephalopathy   Lactic acidosis   Hyponatremia   Hyperglycemia   Elevated brain natriuretic peptide (BNP) level   (HFpEF) heart failure with preserved ejection fraction (HCC)   Acute on chronic combined hypoxemic and hypercapnic respiratory failure in the setting of COPD exacerbation -Continue on Zosyn with vancomycin discontinued as MRSA negative for coverage, but may discontinue shortly -Continue steroids and breathing treatments as ordered -Appreciate PCCM evaluation with hopeful extubation soon  Acute metabolic encephalopathy related to above -Continue to monitor once off sedation  Lactic acidosis with sepsis related to pneumonia versus hypoxemia -Procalcitonin notably low with no leukocytosis so more likely hypoxemia -Improving after fluid administration -Continue to monitor in a.m.  Hyponatremia-improving -Continue gentle IV fluid with normal saline and monitor  Hypokalemia -Replete IV and re-evaluate in am  Hyperglycemia -Currently stable while on steroids  Essential hypertension -Holding home medications due to soft BP -Plan to resume after extubation -Continue monitoring  GERD -PPI  Dyslipidemia -Resume home medications once able to tolerate oral  History of diastolic CHF -Holding beta-blocker -Resume home medications once able -Plan to decrease IV fluid rate   DVT prophylaxis: Lovenox Code Status: Full code Family Communication: Discussed with daughter on 11/8 on phone Disposition Plan:  Status is: Inpatient  Remains inpatient appropriate because:IV treatments appropriate due to intensity of illness or inability to take PO and Inpatient level of care appropriate due to severity of illness   Dispo: The patient is from: Home               Anticipated d/c is to: Home  Anticipated d/c date is: 3 days              Patient currently is not medically stable to d/c.  Patient currently remains intubated and will need extubation and aggressive management of COPD prior to discharge.  Consultants:   PCCM  Procedures:   See below  Antimicrobials:  Anti-infectives (From admission, onward)   Start     Dose/Rate Route Frequency Ordered Stop   09/30/20 0500  vancomycin (VANCOCIN) IVPB 1000 mg/200 mL premix  Status:  Discontinued        1,000 mg 200 mL/hr over 60 Minutes Intravenous Every 12 hours 09/29/20 1459 09/30/20 0654   09/30/20 0300  cefTRIAXone (ROCEPHIN) 1 g in sodium chloride 0.9 % 100 mL IVPB  Status:  Discontinued        1 g 200 mL/hr over 30 Minutes Intravenous Every 24 hours 09/29/20 0638 09/29/20 1341   09/29/20 2300  piperacillin-tazobactam (ZOSYN) IVPB 3.375 g        3.375 g 12.5 mL/hr over 240 Minutes Intravenous Every 8 hours 09/29/20 1446     09/29/20 1500  vancomycin (VANCOREADY) IVPB 2000 mg/400 mL        2,000 mg 200 mL/hr over 120 Minutes Intravenous  Once 09/29/20 1406 09/29/20 1746   09/29/20 1500  ceFEPIme (MAXIPIME) 2 g in sodium chloride 0.9 % 100 mL IVPB  Status:  Discontinued        2 g 200 mL/hr over 30 Minutes Intravenous Every 8 hours 09/29/20 1406 09/29/20 1446   09/29/20 0315  cefTRIAXone (ROCEPHIN) 1 g in sodium chloride 0.9 % 100 mL IVPB        1 g 200 mL/hr over 30 Minutes Intravenous  Once 09/29/20 0309 09/29/20 0441   09/29/20 0315  azithromycin (ZITHROMAX) 500 mg in sodium chloride 0.9 % 250 mL IVPB  Status:  Discontinued        500 mg 250 mL/hr over 60 Minutes Intravenous Every 24 hours 09/29/20 0309 09/29/20 1341       Subjective: Patient seen and evaluated today with no acute events noted overnight.  He continues to remain intubated on the ventilator this morning.  Objective: Vitals:   09/30/20 0835 09/30/20 0900 09/30/20 1000 09/30/20 1100  BP:  (!) 123/57 (!)  120/59 (!) 115/93  Pulse:  75 68 (!) 106  Resp:  (!) 26 (!) 26 19  Temp:  99 F (37.2 C) 98.6 F (37 C) 98.4 F (36.9 C)  TempSrc:      SpO2: 95% 97% 96% 92%  Weight:      Height: 5\' 10"  (1.778 m)       Intake/Output Summary (Last 24 hours) at 09/30/2020 1236 Last data filed at 09/30/2020 1038 Gross per 24 hour  Intake 3992.58 ml  Output 700 ml  Net 3292.58 ml   Filed Weights   09/29/20 0211 09/29/20 1413 09/30/20 0500  Weight: 84.9 kg 88.8 kg 86.3 kg    Examination:  General exam: Appears calm and comfortable, sedated on ventilator Respiratory system: Clear to auscultation. Respiratory effort normal.  Intubated with FiO2 40% Cardiovascular system: S1 & S2 heard, RRR.  Gastrointestinal system: Abdomen is moderately distended, soft and nontender.  Central nervous system: Unresponsive on ventilator Extremities: No edema Skin: No rashes, lesions or ulcers Psychiatry: Cannot be assessed    Data Reviewed: I have personally reviewed following labs and imaging studies  CBC: Recent Labs  Lab 09/29/20 0220 09/29/20 0732 09/30/20 0453  WBC 7.4 7.4 6.6  NEUTROABS 5.9  --   --   HGB 13.3 11.2* 9.8*  HCT 41.1 35.4* 30.4*  MCV 87.6 86.6 87.6  PLT 326 290 263   Basic Metabolic Panel: Recent Labs  Lab 09/29/20 0220 09/29/20 0732 09/29/20 1221 09/30/20 0453  NA 129*  --  130* 133*  K 3.5  --  3.4* 3.4*  CL 75*  --  80* 90*  CO2 47*  --  40* 35*  GLUCOSE 161*  --  118* 115*  BUN 8  --  10 14  CREATININE 0.73  --  0.64 0.62  CALCIUM 9.0  --  8.8* 8.3*  MG  --  1.8  --   --   PHOS  --  <1.0*  --   --    GFR: Estimated Creatinine Clearance: 101.4 mL/min (by C-G formula based on SCr of 0.62 mg/dL). Liver Function Tests: Recent Labs  Lab 09/29/20 0220 09/30/20 0453  AST 44* 24  ALT 24 16  ALKPHOS 81 50  BILITOT 0.6 0.5  PROT 7.6 5.5*  ALBUMIN 3.6 2.5*   No results for input(s): LIPASE, AMYLASE in the last 168 hours. No results for input(s): AMMONIA in the  last 168 hours. Coagulation Profile: Recent Labs  Lab 09/30/20 0453  INR 1.0   Cardiac Enzymes: No results for input(s): CKTOTAL, CKMB, CKMBINDEX, TROPONINI in the last 168 hours. BNP (last 3 results) No results for input(s): PROBNP in the last 8760 hours. HbA1C: No results for input(s): HGBA1C in the last 72 hours. CBG: Recent Labs  Lab 09/29/20 1950 09/30/20 0047 09/30/20 0423 09/30/20 0726 09/30/20 1111  GLUCAP 118* 118* 101* 120* 136*   Lipid Profile: No results for input(s): CHOL, HDL, LDLCALC, TRIG, CHOLHDL, LDLDIRECT in the last 72 hours. Thyroid Function Tests: No results for input(s): TSH, T4TOTAL, FREET4, T3FREE, THYROIDAB in the last 72 hours. Anemia Panel: No results for input(s): VITAMINB12, FOLATE, FERRITIN, TIBC, IRON, RETICCTPCT in the last 72 hours. Sepsis Labs: Recent Labs  Lab 09/29/20 0325 09/29/20 0732 09/29/20 0735 09/29/20 1221 09/30/20 0459  PROCALCITON  --   --  0.13  --   --   LATICACIDVEN 2.0* 2.5*  --  2.9* 1.5    Recent Results (from the past 240 hour(s))  Respiratory Panel by RT PCR (Flu A&B, Covid) - Nasopharyngeal Swab     Status: None   Collection Time: 09/29/20  2:20 AM   Specimen: Nasopharyngeal Swab  Result Value Ref Range Status   SARS Coronavirus 2 by RT PCR NEGATIVE NEGATIVE Final    Comment: (NOTE) SARS-CoV-2 target nucleic acids are NOT DETECTED.  The SARS-CoV-2 RNA is generally detectable in upper respiratoy specimens during the acute phase of infection. The lowest concentration of SARS-CoV-2 viral copies this assay can detect is 131 copies/mL. A negative result does not preclude SARS-Cov-2 infection and should not be used as the sole basis for treatment or other patient management decisions. A negative result may occur with  improper specimen collection/handling, submission of specimen other than nasopharyngeal swab, presence of viral mutation(s) within the areas targeted by this assay, and inadequate number of viral  copies (<131 copies/mL). A negative result must be combined with clinical observations, patient history, and epidemiological information. The expected result is Negative.  Fact Sheet for Patients:  https://www.moore.com/  Fact Sheet for Healthcare Providers:  https://www.young.biz/  This test is no t yet approved or cleared by the [place] FDA and  has been authorized for detection and/or diagnosis of SARS-CoV-2 by  FDA under an Emergency Use Authorization (EUA). This EUA will remain  in effect (meaning this test can be used) for the duration of the COVID-19 declaration under Section 564(b)(1) of the Act, 21 U.S.C. section 360bbb-3(b)(1), unless the authorization is terminated or revoked sooner.     Influenza A by PCR NEGATIVE NEGATIVE Final   Influenza B by PCR NEGATIVE NEGATIVE Final    Comment: (NOTE) The Xpert Xpress SARS-CoV-2/FLU/RSV assay is intended as an aid in  the diagnosis of influenza from Nasopharyngeal swab specimens and  should not be used as a sole basis for treatment. Nasal washings and  aspirates are unacceptable for Xpert Xpress SARS-CoV-2/FLU/RSV  testing.  Fact Sheet for Patients: https://www.moore.com/  Fact Sheet for Healthcare Providers: https://www.young.biz/  This test is not yet approved or cleared by the [place] FDA and  has been authorized for detection and/or diagnosis of SARS-CoV-2 by  FDA under an Emergency Use Authorization (EUA). This EUA will remain  in effect (meaning this test can be used) for the duration of the  Covid-19 declaration under Section 564(b)(1) of the Act, 21  U.S.C. section 360bbb-3(b)(1), unless the authorization is  terminated or revoked. Performed at [facility], [address] [place], [place] [address]   Blood culture (routine x 2)     Status: None (Preliminary result)   Collection Time: 09/29/20  3:25 AM   Specimen: BLOOD LEFT  HAND  Result Value Ref Range Status   Specimen Description BLOOD LEFT HAND  Final   Special Requests   Final    BOTTLES DRAWN AEROBIC AND ANAEROBIC Blood Culture adequate volume   Culture   Final    NO GROWTH 1 DAY Performed at [facility], [address] [place], [place] [address]    Report Status PENDING  Incomplete  Blood culture (routine x 2)     Status: None (Preliminary result)   Collection Time: 09/29/20  3:26 AM   Specimen: BLOOD RIGHT HAND  Result Value Ref Range Status   Specimen Description BLOOD RIGHT HAND  Final   Special Requests   Final    BOTTLES DRAWN AEROBIC AND ANAEROBIC Blood Culture adequate volume   Culture   Final    NO GROWTH 1 DAY Performed at [facility], [address], [place], [place] [address]    Report Status PENDING  Incomplete  MRSA PCR Screening     Status: None   Collection Time: 09/29/20  1:42 PM   Specimen: Nasal Mucosa; Nasopharyngeal  Result Value Ref Range Status   MRSA by PCR NEGATIVE NEGATIVE Final    Comment:        The GeneXpert MRSA Assay (FDA approved for NASAL specimens only), is one component of a comprehensive MRSA colonization surveillance program. It is not intended to diagnose MRSA infection nor to guide or monitor treatment for MRSA infections. Performed at [facility], [address] [place], [place] [address]          Radiology Studies: CT Head Wo Contrast  Result Date: 09/29/2020 CLINICAL DATA:  Initial evaluation for acute mental status change. EXAM: CT HEAD WITHOUT CONTRAST TECHNIQUE: Contiguous axial images were obtained from the base of the skull through the vertex without intravenous contrast. COMPARISON:  Prior head CT from 12/24/2019. FINDINGS: Brain: Mild age-related cerebral atrophy with chronic small vessel ischemic disease, stable. No evidence for acute intracranial hemorrhage. No findings to suggest acute large vessel territory infarct. No mass lesion, midline shift, or mass effect. Ventricles are  normal in  size without evidence for hydrocephalus. No extra-axial fluid collection identified. Vascular: No hyperdense vessel identified. Skull: Scalp soft tissues demonstrate no acute abnormality. Calvarium intact. Sinuses/Orbits: Globes and orbital soft tissues demonstrate no acute finding. Remote fracture of the left lamina papyracea noted. Mild scattered mucosal thickening noted within the ethmoidal air cells mastoid air cells are clear. Orogastric tube partially visualized coiled within the oropharynx. IMPRESSION: 1. No acute intracranial abnormality. 2. Mild age-related cerebral atrophy with chronic small vessel ischemic disease, stable. 3. Orogastric tube partially visualized coiled within the oropharynx. Repositioning suggested. Electronically Signed   By: Rise Mu M.D.   On: 09/29/2020 05:11   DG Chest Portable 1 View  Result Date: 09/29/2020 CLINICAL DATA:  Nasogastric tube placement; history CHF, COPD, hypertension, former smoker, pneumonia EXAM: PORTABLE CHEST 1 VIEW COMPARISON:  Portable exam 1254 hours compared to 0658 hours FINDINGS: Of endotracheal tube projects 6.5 cm above carina. Nasogastric tube extends into stomach though the proximal side-port is just above the gastroesophageal junction; recommend advancing tube 5 cm. EKG leads project over chest. Normal heart size, mediastinal contours, and pulmonary vascularity. Scattered interstitial infiltrates in both lungs which could represent edema or infection. Lung apices excluded. No pleural effusion or gross pneumothorax. IMPRESSION: Recommend advancing nasogastric tube 5 cm. Scattered interstitial infiltrates question edema versus atypical infection. Findings called to Fleet Contras RN in ED on 09/29/2020 at 1334 hours. Electronically Signed   By: Ulyses Southward M.D.   On: 09/29/2020 13:34   DG Chest Port 1 View  Result Date: 09/29/2020 CLINICAL DATA:  Respiratory distress. EXAM: PORTABLE CHEST 1 VIEW COMPARISON:  09/29/2020, earlier  today. FINDINGS: The endotracheal tube tip is above the carina. The nasogastric tube has been retracted. The tip of the NG tube is at the level of the carina and there is a loop of NG tube within the lower neck which may be in the hypopharynx. Stable cardiomediastinal contours. Diffuse chronic interstitial coarsening is identified bilaterally. Streaky bilateral opacities compatible with areas of subsegmental atelectasis. No superimposed airspace consolidation. IMPRESSION: 1. The nasogastric tube has been retracted. The tip of the NG tube is at the level of the carina and there is a loop of NG tube within the lower neck which may be in the hypopharynx. 2. Satisfactory position of ET tube. 3. No change in aeration a lungs compared with earlier exam. Electronically Signed   By: Signa Kell M.D.   On: 09/29/2020 07:54   DG Chest Portable 1 View  Result Date: 09/29/2020 CLINICAL DATA:  Initial evaluation for intubation. EXAM: PORTABLE CHEST 1 VIEW COMPARISON:  Prior radiograph from 04/04/2020. FINDINGS: Endotracheal tube in place with tip position 4.5 cm above the carina. Enteric tube courses into the abdomen, tip overlying the proximal stomach, side hole at and/or near the level of the GE junction. Transverse heart size stable, and remains within normal limits. Mediastinal silhouette normal. Aortic atherosclerosis. Lungs are hyperinflated with changes of COPD/emphysema. Few scattered superimposed patchy opacities within the mid and lower lungs bilaterally could reflect atelectasis or infiltrates. No pulmonary edema or definite pleural effusion. No pneumothorax. Irregular nodular density overlying the peripheral left lung apex favored to be related to overlying EKG lead. No acute osseous finding. IMPRESSION: 1. Tip of endotracheal tube 4.5 cm above the carina. 2. Enteric tube courses into the abdomen, tip overlying the proximal stomach, side hole at and/or near the level of the GE junction. 3. COPD. Superimposed  patchy and streaky opacities within the mid and lower lungs could reflect  atelectasis or possibly small infiltrates. Electronically Signed   By: Rise Mu M.D.   On: 09/29/2020 03:01        Scheduled Meds: . chlorhexidine gluconate (MEDLINE KIT)  15 mL Mouth Rinse BID  . Chlorhexidine Gluconate Cloth  6 each Topical Q0600  . dextromethorphan-guaiFENesin  1 tablet Oral BID  . enoxaparin (LOVENOX) injection  40 mg Subcutaneous Q24H  . ipratropium-albuterol  3 mL Nebulization Q6H  . mouth rinse  15 mL Mouth Rinse 10 times per day  . methylPREDNISolone (SOLU-MEDROL) injection  40 mg Intravenous Q12H  . pantoprazole (PROTONIX) IV  40 mg Intravenous Q24H   Continuous Infusions: . sodium chloride Stopped (09/30/20 0426)  . fentaNYL infusion INTRAVENOUS 50 mcg/hr (09/30/20 1038)  . piperacillin-tazobactam (ZOSYN)  IV Stopped (09/30/20 1001)  . potassium chloride    . propofol (DIPRIVAN) infusion 15 mcg/kg/min (09/30/20 1038)     LOS: 1 day    Time spent: 40 minutes    Nuria Phebus Hoover Brunette, DO [facility]  If 7PM-7AM, please contact night-coverage [email or web address] 09/30/2020, 12:36 PM

## 2020-09-30 NOTE — TOC Initial Note (Signed)
Transition of Care [facility]) - Initial/Assessment Note    Patient Details  Name: Jeffrey [name] MRN: [id number] Date of Birth: 25-Mar-1961  Transition of Care [facility]) CM/SW Contact:    Barry Brunner, LCSW Phone Number: 09/30/2020, 3:01 PM  Clinical Narrative:                 Patient is a 59 year old male admitted for Acute respiratory failure with hypercapnia. CSW contacted patient's daughter for assessment. [name] reported that patient lives at home with his ex wife who is not able to monitor patient or provide 24 hour assistance. [name] expressed that family would be agreeable to [facility] and SNF. Patient has Hx with [facility] but would prefer to not go back. [name] reported that patient is not able to preform her ADL's at home and struggles to utilize bathroom due to decreased ability to ambulate. Currently patient has O2 and a nebulizer at home according to the patient's daughter. It was requested that patient receive a walker, wheelchair and BSC if patient discharged home with [facility]. CSW to follow.   Expected Discharge Plan: Skilled Nursing Facility Barriers to Discharge: Continued Medical Work up   Patient Goals and CMS Choice Patient states their goals for this hospitalization and ongoing recovery are:: (P) Rehab with SNF   Choice offered to / list presented to : Patient, Adult Children  Expected Discharge Plan and Services Expected Discharge Plan: Skilled Nursing Facility In-house Referral: (P) [facility] Discharge Planning Services: (P) Other - See comment Post Acute Care Choice: Skilled Nursing Facility Living arrangements for the past 2 months: Single Family Home                 DME Arranged: (P) Apnea monitor, Heart failure home health orders             Date HH Agency Contacted: (P) 11/21/20      Prior Living Arrangements/Services Living arrangements for the past 2 months: Single Family Home Lives with:: Other (Comment) (Ex wife) Patient  language and need for interpreter reviewed:: Yes Do you feel safe going back to the place where you live?: Yes      Need for Family Participation in Patient Care: Yes (Comment) Care giver support system in place?: Yes (comment)   Criminal Activity/Legal Involvement Pertinent to Current Situation/Hospitalization: No - Comment as needed  Activities of Daily Living Home Assistive Devices/Equipment: Oxygen ADL Screening (condition at time of admission) Patient's cognitive ability adequate to safely complete daily activities?: Yes Is the patient deaf or have difficulty hearing?: No Does the patient have difficulty seeing, even when wearing glasses/contacts?: No Does the patient have difficulty concentrating, remembering, or making decisions?: No Patient able to express need for assistance with ADLs?: Yes Does the patient have difficulty dressing or bathing?: Yes Independently performs ADLs?: Yes (appropriate for developmental age) Does the patient have difficulty walking or climbing stairs?: Yes Weakness of Legs: Both Weakness of Arms/Hands: None  Permission Sought/Granted Permission sought to share information with : Family Supports Permission granted to share information with : Yes, Verbal Permission Granted  Share Information with NAME: Burtt,[name]  Permission granted to share info w AGENCY: SNF and HH  Permission granted to share info w Relationship: Daughter  Permission granted to share info w Contact Information: [phone]  Emotional Assessment       Orientation: : Oriented to Self, Oriented to Situation, Oriented to Place, Oriented to  Time Alcohol / Substance Use: Not Applicable Psych Involvement: No (  comment)  Admission diagnosis:  Dyspnea [R06.00] COPD exacerbation (HCC) [J44.1] Acute respiratory failure with hypercapnia (HCC) [J96.02] Patient Active Problem List   Diagnosis Date Noted  . Acute respiratory failure with hypercapnia (HCC) 09/29/2020  . Lactic  acidosis 09/29/2020  . Hyponatremia 09/29/2020  . Hyperglycemia 09/29/2020  . Elevated brain natriuretic peptide (BNP) level 09/29/2020  . (HFpEF) heart failure with preserved ejection fraction (HCC) 09/29/2020  . Rhinitis, chronic 08/01/2020  . Chronic respiratory failure with hypoxia and hypercapnia (HCC) 03/30/2020  . Acute on chronic diastolic CHF (congestive heart failure) (HCC) 12/26/2019  . Aspiration pneumonitis (HCC) 12/26/2019  . Acute on chronic respiratory failure with hypoxia and hypercapnia (HCC) 12/25/2019  . Lobar pneumonia (HCC) 12/25/2019  . Acute metabolic encephalopathy 12/25/2019  . Acute on chronic respiratory failure with hypercapnia (HCC) 12/24/2019  . COPD with acute exacerbation (HCC) 10/12/2019  . Respiratory failure with hypoxia (HCC) 04/30/2019  . Acute on chronic respiratory failure with hypoxia (HCC) 04/30/2019  . Bronchiectasis (HCC) 03/24/2019  . Fever 03/24/2019  . Cough   . Dysphagia   . Palliative care by specialist   . Goals of care, counseling/discussion   . Hepatic encephalopathy (HCC)   . COPD exacerbation (HCC) 09/16/2018  . Essential hypertension 09/16/2018  . GERD (gastroesophageal reflux disease) 09/16/2018  . Hypoxia   . Chronic diastolic CHF (congestive heart failure) (HCC)   . Bipolar disorder with severe depression (HCC) 02/01/2018  . Alcohol use disorder, severe, dependence (HCC) 02/01/2018  . Cannabis use disorder, severe, dependence (HCC) 02/01/2018  . MDD (major depressive disorder), recurrent episode, severe (HCC) 01/31/2018  . Internal and external bleeding hemorrhoids   . Rectal bleeding   . Iron deficiency anemia due to chronic blood loss 03/09/2017  . Anemia   . GIB (gastrointestinal bleeding) 02/07/2017  . Peptic ulcer disease   . Bipolar disorder (HCC)   . 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 H/O: depression 08/26/2016  . Dyslipidemia 08/26/2016  . Cigarette smoker 08/26/2016  . [name] (peripheral vascular  disease) (HCC) 06/18/2015  . Chronic diarrhea 05/02/2013  . Hematochezia 05/02/2013  . Abnormal weight loss 05/02/2013  . Abdominal pain, periumbilical 05/02/2013   PCP:  Gareth Morgan, MD Pharmacy:   [facility] [place] - [address] [place] [address] Phone: [phone] Fax: [phone]     Social Determinants of Health (SDOH) Interventions    Readmission Risk Interventions Readmission Risk Prevention Plan 09/30/2020 05/01/2019 03/30/2019  Transportation Screening Complete Complete -  Medication Review (RN Care Manager) Complete - -  PCP or Specialist appointment within 3-5 days of discharge - - Not Complete  PCP/Specialist Appt Not Complete comments - - recommended for 1 week f/u, PCP closed today, patient to call tomorrow and make appt.   HRI or Home Care Consult Complete Complete -  SW Recovery Care/Counseling Consult Complete Complete -  Palliative Care Screening Not Applicable Not Applicable -  Skilled Nursing Facility - Not Applicable -  Some recent data might be hidden

## 2020-09-30 NOTE — Progress Notes (Addendum)
Initial Nutrition Assessment  DOCUMENTATION CODES:      INTERVENTION:  Advance Vital High Protein to goal rate 55 ml/hr (1320 ml) and ProSource TF-TID via OGT   Tube feeding regimen plus sedation provides 1986 kcal, 148.5 gr of protein, and 1104 ml of H2O.     NUTRITION DIAGNOSIS:   Inadequate oral intake related to inability to eat as evidenced by NPO status.   GOAL:  Provide needs based on ASPEN/SCCM guidelines  MONITOR:  TF tolerance, Vent status, Weight trends, I & O's, Skin, Labs    REASON FOR ASSESSMENT:   Consult, Ventilator Enteral/tube feeding initiation and management  ASSESSMENT:  History of COPD, CHF, HTN, [name], GERD who presents with respiratory distress and AMS.  Patient is currently intubated on ventilator support. Discussed patient with nursing and during rounds today. Patient has been cleared to start tube feeding.   11/9- Patient is tolerating tube feeds at 40 ml/hr per nursing report. His sedative has increased overnight and will adjust tube feeds to prevent overfeeding.   MV: 11.2   L/min Temp (24hrs), Avg:98.7 F (37.1 C), Min:97.2 F (36.2 C), Max:99.3 F (37.4 C)  Propofol: 20.7 ml/hr (provides-546 kcal q 24 hrs at current rate)   Intake/Output Summary (Last 24 hours) at 10/01/2020 1118 Last data filed at 10/01/2020 0743 Gross per 24 hour  Intake 1129.45 ml  Output 875 ml  Net 254.45 ml      Medications reviewed and include: Vitamin B-12, Folvite, Prozac, Neurontin, Solumedrol, Protonix and Lopressor.  Labs: BMP Latest Ref Rng & Units 09/30/2020 09/29/2020 09/29/2020  Glucose 70 - 99 mg/dL 025(E) 527(P) 824(M)  BUN 6 - 20 mg/dL 14 10 8   Creatinine 0.61 - 1.24 mg/dL 3.53 6.14  Sodium 135 - 145 mmol/L 133(L) 130(L) 129(L)  Potassium 3.5 - 5.1 mmol/L 3.4(L) 3.4(L) 3.5  Chloride 98 - 111 mmol/L 90(L) 80(L) 75(L)  CO2 22 - 32 mmol/L 35(H) 40(H) 47(H)  Calcium 8.9 - 10.3 mg/dL 8.3(L) 8.8(L) 9.0     NUTRITION - FOCUSED PHYSICAL  EXAM:  Unable to complete Nutrition-Focused physical exam at this time.    Diet Order:   Diet Order            Diet NPO time specified  Diet effective now                 EDUCATION NEEDS:  Not appropriate for education at this time   Skin:  Skin Assessment: Reviewed RN Assessment  Last BM:  unknown  Height:   Ht Readings from Last 1 Encounters:  09/30/20 5\' 10"  (1.778 m)    Weight:   Wt Readings from Last 1 Encounters:  09/30/20 86.3 kg    Ideal Body Weight:   75 kg   BMI:  Body mass index is 27.3 kg/m.  Estimated Nutritional Needs:   Kcal:  1930  Protein:  150 gr  Fluid:  >1900 ml daily  MS,RD,CSG,LDN Pager: 13/08/21

## 2020-10-01 ENCOUNTER — Inpatient Hospital Stay (HOSPITAL_COMMUNITY): Payer: Medicare Other

## 2020-10-01 DIAGNOSIS — J9621 Acute and chronic respiratory failure with hypoxia: Secondary | ICD-10-CM | POA: Diagnosis not present

## 2020-10-01 DIAGNOSIS — J9622 Acute and chronic respiratory failure with hypercapnia: Secondary | ICD-10-CM | POA: Diagnosis not present

## 2020-10-01 DIAGNOSIS — J441 Chronic obstructive pulmonary disease with (acute) exacerbation: Secondary | ICD-10-CM | POA: Diagnosis not present

## 2020-10-01 LAB — BLOOD GAS, ARTERIAL
Acid-Base Excess: 8.8 mmol/L — ABNORMAL HIGH (ref 0.0–2.0)
Bicarbonate: 32 mmol/L — ABNORMAL HIGH (ref 20.0–28.0)
Drawn by: 22223
FIO2: 40
MECHVT: 550 mL
O2 Saturation: 98.3 %
PEEP: 5 cmH2O
Patient temperature: 36.5
RATE: 16 resp/min
pCO2 arterial: 51.2 mmHg — ABNORMAL HIGH (ref 32.0–48.0)
pH, Arterial: 7.428 (ref 7.350–7.450)
pO2, Arterial: 113 mmHg — ABNORMAL HIGH (ref 83.0–108.0)

## 2020-10-01 LAB — CBC
HCT: 31 % — ABNORMAL LOW (ref 39.0–52.0)
Hemoglobin: 9.6 g/dL — ABNORMAL LOW (ref 13.0–17.0)
MCH: 27.4 pg (ref 26.0–34.0)
MCHC: 31 g/dL (ref 30.0–36.0)
MCV: 88.6 fL (ref 80.0–100.0)
Platelets: 276 10*3/uL (ref 150–400)
RBC: 3.5 MIL/uL — ABNORMAL LOW (ref 4.22–5.81)
RDW: 14.4 % (ref 11.5–15.5)
WBC: 6.6 10*3/uL (ref 4.0–10.5)
nRBC: 0 % (ref 0.0–0.2)

## 2020-10-01 LAB — BASIC METABOLIC PANEL
Anion gap: 10 (ref 5–15)
BUN: 19 mg/dL (ref 6–20)
CO2: 30 mmol/L (ref 22–32)
Calcium: 8.4 mg/dL — ABNORMAL LOW (ref 8.9–10.3)
Chloride: 92 mmol/L — ABNORMAL LOW (ref 98–111)
Creatinine, Ser: 0.52 mg/dL — ABNORMAL LOW (ref 0.61–1.24)
GFR, Estimated: >60 mL/min (ref >60)
Glucose, Bld: 107 mg/dL — ABNORMAL HIGH (ref 70–99)
Potassium: 3.6 mmol/L (ref 3.5–5.1)
Sodium: 132 mmol/L — ABNORMAL LOW (ref 135–145)

## 2020-10-01 LAB — GLUCOSE, CAPILLARY
Glucose-Capillary: 105 mg/dL — ABNORMAL HIGH (ref 70–99)
Glucose-Capillary: 115 mg/dL — ABNORMAL HIGH (ref 70–99)
Glucose-Capillary: 130 mg/dL — ABNORMAL HIGH (ref 70–99)
Glucose-Capillary: 130 mg/dL — ABNORMAL HIGH (ref 70–99)
Glucose-Capillary: 133 mg/dL — ABNORMAL HIGH (ref 70–99)
Glucose-Capillary: 134 mg/dL — ABNORMAL HIGH (ref 70–99)

## 2020-10-01 LAB — TRIGLYCERIDES: Triglycerides: 164 mg/dL — ABNORMAL HIGH (ref <150)

## 2020-10-01 LAB — MAGNESIUM: Magnesium: 2 mg/dL (ref 1.7–2.4)

## 2020-10-01 LAB — PHOSPHORUS: Phosphorus: 2.4 mg/dL — ABNORMAL LOW (ref 2.5–4.6)

## 2020-10-01 LAB — PROCALCITONIN: Procalcitonin: <0.1 ng/mL

## 2020-10-01 MED ORDER — PROSOURCE TF PO LIQD
45.0000 mL | Freq: Three times a day (TID) | ORAL | Status: DC
  Administered 2020-10-01 – 2020-10-02 (×3): 45 mL
  Filled 2020-10-01 (×3): qty 45

## 2020-10-01 MED ORDER — VITAL HIGH PROTEIN PO LIQD
1000.0000 mL | ORAL | Status: DC
  Administered 2020-10-01 – 2020-10-02 (×2): 1000 mL

## 2020-10-01 NOTE — Progress Notes (Signed)
PROGRESS NOTE    Jeffrey [name]  WNU:[id number] DOB: 02/03/1961 DOA: 09/29/2020 PCP: Gareth Morgan, MD   Brief Narrative:  Per HPI: Jeffrey [name] a 59 y.o.malewith medical history significant forhypertension, hyperlipidemia, COPD(on 3-4 LPM of supplemental oxygen via [place] at baseline), CHF, GERDwho presents to the emergency department via EMS due torespiratory distress. Patient was unable to provide history due to being intubated and sedated. History was obtained from ED physician and from ED medical record. Per report, EMS was activated due to patient having shortness of breath, on arrival of the EMS, patient was noted to be in agonal breathing and was unresponsive after 1 hour of having respiratory difficulty. They could not obtain a lineand patient was assisted with bag ventilation with O2 sat at 85% arrival to the ED. Patient has had prior intubations due to acute hypercapnic respiratory failure secondary to COPD exacerbation.  ED Course: In the emergency department, temperature was 97.3, who presented with altered mental status possibly due to hypoxia, sohewas intubated and sedated. Work-up in the ED showed normal CBC, hyponatremia, hyperglycemia, lactic acid 2.0. ABG showed hypercapnia on FiO2 of 50%. CT of head showed no acute intracranialabnormality. Chest x-ray shows evidence of COPD and possible infiltrate, empiric IV antibiotics with Rocephin and azithromycin were given. Hospitalist was asked to admit patient for further evaluation and management.  -Patient admitted with acute on chronic combined respiratory failure in the setting of COPD exacerbation with associated acute metabolic encephalopathy.  He was noted to have hyponatremia and lactic acidosis which have been improving with IV fluid administration antibiotics.  He is noted to have a low procalcitonin level with no leukocytosis otherwise noted.  Vancomycin was discontinued 11/8 and Zosyn will be  discontinued 11/9.  PCCM ongoing management appreciated with plans to continue to wean off ventilator as tolerated.  Discussed plan to obtain palliative care consultation once extubated to address goals of care.  Family members interested in SNF placement on discharge.   Assessment & Plan:   Active Problems:   Dyslipidemia   COPD exacerbation (HCC)   Essential hypertension   GERD (gastroesophageal reflux disease)   Acute on chronic respiratory failure with hypoxia and hypercapnia (HCC)   Acute metabolic encephalopathy   Lactic acidosis   Hyponatremia   Hyperglycemia   Elevated brain natriuretic peptide (BNP) level   (HFpEF) heart failure with preserved ejection fraction (HCC)   Acute on chronic combined hypoxemic and hypercapnic respiratory failure in the setting of COPD exacerbation -Antibiotics discontinued as procalcitonin low no leukocytosis noted -Continue steroids and breathing treatments as ordered -Appreciate PCCM ongoing evaluation with hopeful extubation soon  Acute hypercapnic encephalopathy related to above -Continue to monitor once off sedation -pCO2 levels downtrending on repeat ABG  Lactic acidosis likely related to hypoxemia on admission -Procalcitonin notably low with no leukocytosis so more likely hypoxemia -No further need for antibiotics noted -Continue to monitor in a.m.  Hyponatremia-stable -Continue gentle IV fluid with normal saline and monitor  Hypokalemia -Repleted, reevaluate in a.m.  Hyperglycemia -Currently stable while on steroids  Essential hypertension -Holding home medications due to soft BP -Plan to resume after extubation -Continue monitoring  GERD -PPI  Dyslipidemia -Resume home medications once able to tolerate oral  History of diastolic CHF -Holding beta-blocker -Resume home medications once able -Keep on gentle fluids   DVT prophylaxis: Lovenox Code Status: Full code Family Communication: Discussed with  daughter on 11/9 on phone Disposition Plan:  Status is: Inpatient  Remains inpatient appropriate because:IV treatments appropriate  due to intensity of illness or inability to take PO and Inpatient level of care appropriate due to severity of illness   Dispo: The patient is from: Home  Anticipated d/c is to:  Likely SNF  Anticipated d/c date is: 3 days  Patient currently is not medically stable to d/c.  Patient currently remains intubated and will need extubation and aggressive management of COPD prior to discharge.  Consultants:   PCCM  Procedures:   See below  Antimicrobials:  Anti-infectives (From admission, onward)   Start     Dose/Rate Route Frequency Ordered Stop   09/30/20 0500  vancomycin (VANCOCIN) IVPB 1000 mg/200 mL premix  Status:  Discontinued        1,000 mg 200 mL/hr over 60 Minutes Intravenous Every 12 hours 09/29/20 1459 09/30/20 0654   09/30/20 0300  cefTRIAXone (ROCEPHIN) 1 g in sodium chloride 0.9 % 100 mL IVPB  Status:  Discontinued        1 g 200 mL/hr over 30 Minutes Intravenous Every 24 hours 09/29/20 0638 09/29/20 1341   09/29/20 2300  piperacillin-tazobactam (ZOSYN) IVPB 3.375 g  Status:  Discontinued        3.375 g 12.5 mL/hr over 240 Minutes Intravenous Every 8 hours 09/29/20 1446 10/01/20 0832   09/29/20 1500  vancomycin (VANCOREADY) IVPB 2000 mg/400 mL        2,000 mg 200 mL/hr over 120 Minutes Intravenous  Once 09/29/20 1406 09/29/20 1746   09/29/20 1500  ceFEPIme (MAXIPIME) 2 g in sodium chloride 0.9 % 100 mL IVPB  Status:  Discontinued        2 g 200 mL/hr over 30 Minutes Intravenous Every 8 hours 09/29/20 1406 09/29/20 1446   09/29/20 0315  cefTRIAXone (ROCEPHIN) 1 g in sodium chloride 0.9 % 100 mL IVPB        1 g 200 mL/hr over 30 Minutes Intravenous  Once 09/29/20 0309 09/29/20 0441   09/29/20 0315  azithromycin (ZITHROMAX) 500 mg in sodium chloride 0.9 % 250 mL IVPB  Status:  Discontinued        500  mg 250 mL/hr over 60 Minutes Intravenous Every 24 hours 09/29/20 0309 09/29/20 1341       Subjective: Patient seen and evaluated today with no new acute concerns or events noted overnight.  He remains sedated on the ventilator.  Objective: Vitals:   10/01/20 0800 10/01/20 0815 10/01/20 0851 10/01/20 0852  BP: 123/62 127/62    Pulse: (!) 54 (!) 54    Resp: 14 14    Temp: (!) 97.5 F (36.4 C) (!) 97.5 F (36.4 C)    TempSrc:      SpO2: 99% 99% 99%   Weight:      Height:    5\' 10"  (1.778 m)    Intake/Output Summary (Last 24 hours) at 10/01/2020 1049 Last data filed at 10/01/2020 0743 Gross per 24 hour  Intake 1129.45 ml  Output 875 ml  Net 254.45 ml   Filed Weights   09/29/20 1413 09/30/20 0500 10/01/20 0500  Weight: 88.8 kg 86.3 kg 88.1 kg    Examination:  General exam: Sedated on ventilator Respiratory system: Clear to auscultation. Respiratory effort normal.  Intubated on FiO2 40% Cardiovascular system: S1 & S2 heard, RRR.  Gastrointestinal system: Abdomen is minimally distended, soft and nontender.  Central nervous system: Unresponsive and sedated on ventilator Extremities: No edema Skin: No rashes, lesions or ulcers Psychiatry: Cannot be assessed    Data Reviewed: I have personally reviewed  following labs and imaging studies  CBC: Recent Labs  Lab 09/29/20 0220 09/29/20 0732 09/30/20 0453 10/01/20 0427  WBC 7.4 7.4 6.6 6.6  NEUTROABS 5.9  --   --   --   HGB 13.3 11.2* 9.8* 9.6*  HCT 41.1 35.4* 30.4* 31.0*  MCV 87.6 86.6 87.6 88.6  PLT 326 290 263 276   Basic Metabolic Panel: Recent Labs  Lab 09/29/20 0220 09/29/20 0732 09/29/20 1221 09/30/20 0453 10/01/20 0427  NA 129*  --  130* 133* 132*  K 3.5  --  3.4* 3.4* 3.6  CL 75*  --  80* 90* 92*  CO2 47*  --  40* 35* 30  GLUCOSE 161*  --  118* 115* 107*  BUN 8  --  10 14 19   CREATININE 0.73  --  0.64 0.62 0.52*  CALCIUM 9.0  --  8.8* 8.3* 8.4*  MG  --  1.8  --   --  2.0  PHOS  --  <1.0*  --    --  2.4*   GFR: Estimated Creatinine Clearance: 109.7 mL/min (A) (by C-G formula based on SCr of 0.52 mg/dL (L)). Liver Function Tests: Recent Labs  Lab 09/29/20 0220 09/30/20 0453  AST 44* 24  ALT 24 16  ALKPHOS 81 50  BILITOT 0.6 0.5  PROT 7.6 5.5*  ALBUMIN 3.6 2.5*   No results for input(s): LIPASE, AMYLASE in the last 168 hours. No results for input(s): AMMONIA in the last 168 hours. Coagulation Profile: Recent Labs  Lab 09/30/20 0453  INR 1.0   Cardiac Enzymes: No results for input(s): CKTOTAL, CKMB, CKMBINDEX, TROPONINI in the last 168 hours. BNP (last 3 results) No results for input(s): PROBNP in the last 8760 hours. HbA1C: No results for input(s): HGBA1C in the last 72 hours. CBG: Recent Labs  Lab 09/30/20 1612 09/30/20 2018 10/01/20 0004 10/01/20 0459 10/01/20 0825  GLUCAP 112* 135* 133* 105* 115*   Lipid Profile: Recent Labs    10/01/20 0427  TRIG 164*   Thyroid Function Tests: No results for input(s): TSH, T4TOTAL, FREET4, T3FREE, THYROIDAB in the last 72 hours. Anemia Panel: No results for input(s): VITAMINB12, FOLATE, FERRITIN, TIBC, IRON, RETICCTPCT in the last 72 hours. Sepsis Labs: Recent Labs  Lab 09/29/20 0325 09/29/20 0732 09/29/20 0735 09/29/20 1221 09/30/20 0459 10/01/20 0427  PROCALCITON  --   --  0.13  --   --  <0.10  LATICACIDVEN 2.0* 2.5*  --  2.9* 1.5  --     Recent Results (from the past 240 hour(s))  Respiratory Panel by RT PCR (Flu A&B, Covid) - Nasopharyngeal Swab     Status: None   Collection Time: 09/29/20  2:20 AM   Specimen: Nasopharyngeal Swab  Result Value Ref Range Status   SARS Coronavirus 2 by RT PCR NEGATIVE NEGATIVE Final    Comment: (NOTE) SARS-CoV-2 target nucleic acids are NOT DETECTED.  The SARS-CoV-2 RNA is generally detectable in upper respiratoy specimens during the acute phase of infection. The lowest concentration of SARS-CoV-2 viral copies this assay can detect is 131 copies/mL. A negative  result does not preclude SARS-Cov-2 infection and should not be used as the sole basis for treatment or other patient management decisions. A negative result may occur with  improper specimen collection/handling, submission of specimen other than nasopharyngeal swab, presence of viral mutation(s) within the areas targeted by this assay, and inadequate number of viral copies (<131 copies/mL). A negative result must be combined with clinical observations, patient history,  and epidemiological information. The expected result is Negative.  Fact Sheet for Patients:  https://www.moore.com/  Fact Sheet for Healthcare Providers:  https://www.young.biz/  This test is no t yet approved or cleared by the [place] FDA and  has been authorized for detection and/or diagnosis of SARS-CoV-2 by FDA under an Emergency Use Authorization (EUA). This EUA will remain  in effect (meaning this test can be used) for the duration of the COVID-19 declaration under Section 564(b)(1) of the Act, 21 U.S.C. section 360bbb-3(b)(1), unless the authorization is terminated or revoked sooner.     Influenza A by PCR NEGATIVE NEGATIVE Final   Influenza B by PCR NEGATIVE NEGATIVE Final    Comment: (NOTE) The Xpert Xpress SARS-CoV-2/FLU/RSV assay is intended as an aid in  the diagnosis of influenza from Nasopharyngeal swab specimens and  should not be used as a sole basis for treatment. Nasal washings and  aspirates are unacceptable for Xpert Xpress SARS-CoV-2/FLU/RSV  testing.  Fact Sheet for Patients: https://www.moore.com/  Fact Sheet for Healthcare Providers: https://www.young.biz/  This test is not yet approved or cleared by the [place] FDA and  has been authorized for detection and/or diagnosis of SARS-CoV-2 by  FDA under an Emergency Use Authorization (EUA). This EUA will remain  in effect (meaning this test can be used)  for the duration of the  Covid-19 declaration under Section 564(b)(1) of the Act, 21  U.S.C. section 360bbb-3(b)(1), unless the authorization is  terminated or revoked. Performed at [facility], [address] [place], [place] [address]   Blood culture (routine x 2)     Status: None (Preliminary result)   Collection Time: 09/29/20  3:25 AM   Specimen: BLOOD LEFT HAND  Result Value Ref Range Status   Specimen Description BLOOD LEFT HAND  Final   Special Requests   Final    BOTTLES DRAWN AEROBIC AND ANAEROBIC Blood Culture adequate volume   Culture   Final    NO GROWTH 2 DAYS Performed at [facility], [address] [place], [place] [address]    Report Status PENDING  Incomplete  Blood culture (routine x 2)     Status: None (Preliminary result)   Collection Time: 09/29/20  3:26 AM   Specimen: BLOOD RIGHT HAND  Result Value Ref Range Status   Specimen Description BLOOD RIGHT HAND  Final   Special Requests   Final    BOTTLES DRAWN AEROBIC AND ANAEROBIC Blood Culture adequate volume   Culture   Final    NO GROWTH 2 DAYS Performed at [facility], [address], [place], [place] [address]    Report Status PENDING  Incomplete  MRSA PCR Screening     Status: None   Collection Time: 09/29/20  1:42 PM   Specimen: Nasal Mucosa; Nasopharyngeal  Result Value Ref Range Status   MRSA by PCR NEGATIVE NEGATIVE Final    Comment:        The GeneXpert MRSA Assay (FDA approved for NASAL specimens only), is one component of a comprehensive MRSA colonization surveillance program. It is not intended to diagnose MRSA infection nor to guide or monitor treatment for MRSA infections. Performed at [facility], [address], [place], [place] [address]          Radiology Studies: [facility] 1 View  Result Date: 10/01/2020 CLINICAL DATA:  Intubation EXAM: PORTABLE CHEST 1 VIEW COMPARISON:  Yesterday FINDINGS: Endotracheal tube with tip just below the clavicular heads. The orogastric  tube reaches the stomach. Hyperinflation  with diaphragm flattening and scarring or atelectasis at at the lateral left costophrenic sulcus. Generalized reticulonodular density. No visible air leak. Normal heart size. IMPRESSION: Stable hardware positioning and inflation. Electronically Signed   By: Marnee Spring M.D.   On: 10/01/2020 04:18   DG CHEST PORT 1 VIEW  Result Date: 09/30/2020 CLINICAL DATA:  Status post OG tube placement.  Intubated patient. EXAM: PORTABLE CHEST 1 VIEW COMPARISON:  Single-view of the chest 09/29/2020. FINDINGS: OG tube is in place with both the tip and side-port in the stomach in good position. Endotracheal tube tip is also in good position, just below the clavicular heads. Prominence of the pulmonary interstitium and multifocal nodular opacities are unchanged. No pneumothorax or pleural fluid. IMPRESSION: ETT and NG tube in good position.  No change in aeration. Electronically Signed   By: Drusilla Kanner M.D.   On: 09/30/2020 13:56   DG Chest Portable 1 View  Result Date: 09/29/2020 CLINICAL DATA:  Nasogastric tube placement; history CHF, COPD, hypertension, former smoker, pneumonia EXAM: PORTABLE CHEST 1 VIEW COMPARISON:  Portable exam 1254 hours compared to 0658 hours FINDINGS: Of endotracheal tube projects 6.5 cm above carina. Nasogastric tube extends into stomach though the proximal side-port is just above the gastroesophageal junction; recommend advancing tube 5 cm. EKG leads project over chest. Normal heart size, mediastinal contours, and pulmonary vascularity. Scattered interstitial infiltrates in both lungs which could represent edema or infection. Lung apices excluded. No pleural effusion or gross pneumothorax. IMPRESSION: Recommend advancing nasogastric tube 5 cm. Scattered interstitial infiltrates question edema versus atypical infection. Findings called to Fleet Contras RN in ED on 09/29/2020 at 1334 hours. Electronically Signed   By: Ulyses Southward M.D.   On: 09/29/2020  13:34        Scheduled Meds: . aspirin EC  81 mg Oral Q breakfast  . atorvastatin  20 mg Oral QPM  . chlorhexidine gluconate (MEDLINE KIT)  15 mL Mouth Rinse BID  . Chlorhexidine Gluconate Cloth  6 each Topical Q0600  . enoxaparin (LOVENOX) injection  40 mg Subcutaneous Q24H  . feeding supplement (PROSource TF)  45 mL Per Tube BID  . feeding supplement (VITAL HIGH PROTEIN)  1,000 mL Per Tube Q24H  . FLUoxetine  40 mg Per Tube q morning - 10a  . folic acid  1 mg Per Tube q morning - 10a  . gabapentin  600 mg Per Tube BID  . ipratropium-albuterol  3 mL Nebulization Q6H  . mouth rinse  15 mL Mouth Rinse 10 times per day  . methylPREDNISolone (SOLU-MEDROL) injection  40 mg Intravenous Q12H  . metoprolol tartrate  12.5 mg Per Tube BID  . OLANZapine  10 mg Per Tube QHS  . pantoprazole sodium  40 mg Per Tube Daily  . traZODone  50 mg Per Tube QHS  . valproic acid  500 mg Per Tube TID  . vitamin B-12  1,000 mcg Per Tube q morning - 10a   Continuous Infusions: . sodium chloride 50 mL/hr at 09/30/20 1310  . fentaNYL infusion INTRAVENOUS 200 mcg/hr (10/01/20 0743)  . propofol (DIPRIVAN) infusion 40 mcg/kg/min (10/01/20 0817)     LOS: 2 days    Time spent: 35 minutes    Marvin Grabill D Sherryll Burger, DO [facility]  If 7PM-7AM, please contact night-coverage [email or web address] 10/01/2020, 10:49 AM

## 2020-10-01 NOTE — Progress Notes (Signed)
NAME:  Jeffrey [name], MRN:  [id number], DOB:  Mar 04, 1961, LOS: 2 ADMISSION DATE:  09/29/2020, CONSULTATION DATE: 09/30/20 REFERRING MD:  Dr. Maurilio Lovely, [facility], CHIEF COMPLAINT:  Respiratory failure   Brief History   59 yo male former smoker with hx of COPD on 3 to 4 liters oxygen at home brought to ER in respiratory distress with altered mental status.  SpO2 78% on room air.  He required intubation in the ER.  He is followed in pulmonary office by Dr. Sherene Sires.    Past Medical History  COPD, Bipolar, Diastolic CHF, Hepatic encephalopathy, Depression, Polysubstance abuse, [name], ETOH, GI bleed, HTN, GERD  Significant Hospital Events   11/07 Admit  Consults:    Procedures:  ETT 11/07 >>  Significant Diagnostic Tests:   PFT 10/09/16 >> 1.84 (49%), FEV1% 48, TLC 7.39 (105%), RV 3.59 (166%), DLCO 36%  A1AT 10/09/16 >> 215, MM  Echo 10/12/19 >> EF 65 to 70%, grade 1 DD, mild/mod LA dilation, mild MR, mild/mod AR  Micro Data:  COVID/Flu 09/29/20 >> negative MRSA PCR 09/29/20 >> negative Blood 09/29/20 >>   Antimicrobials:  Zithromax 11/06  Rocephin 11/06  Vancomycin 11/07  Zosyn 11/07 >> 11/09  Interim history/subjective:  Agitated with WUA.  Objective   Blood pressure (!) 114/50, pulse (!) 57, temperature 97.9 F (36.6 C), resp. rate 16, height 5\' 10"  (1.778 m), weight 88.1 kg, SpO2 97 %.    Vent Mode: PRVC FiO2 (%):  [40 %] 40 % Set Rate:  [16 bmp] 16 bmp Vt Set:  [550 mL] 550 mL PEEP:  [5 cmH20] 5 cmH20 Plateau Pressure:  [17 cmH20-22 cmH20] 17 cmH20   Intake/Output Summary (Last 24 hours) at 10/01/2020 1358 Last data filed at 10/01/2020 1330 Gross per 24 hour  Intake 3417.64 ml  Output 875 ml  Net 2542.64 ml   Filed Weights   09/29/20 1413 09/30/20 0500 10/01/20 0500  Weight: 88.8 kg 86.3 kg 88.1 kg    Examination:  General - sedated Eyes - pupils reactive ENT - no sinus tenderness, no stridor Cardiac - regular rate/rhythm, no murmur Chest - decreased  BS, no wheeze/rales Abdomen - soft, non tender, + bowel sounds Extremities - no cyanosis, clubbing, or edema Skin - no rashes Neuro - RASS -3   Resolved Hospital Problem list   Lactic acidosis from hypoxia  Assessment & Plan:   Acute on chronic hypoxic/hypercapnic respiratory failure from COPD exacerbation. - not ready for pressure support/extubation trial; mental status will be barrier to extubation - continue solumedrol   day 3 of ABx - continue solumedrol 40 mg bid; likely can transition to prednisone on 11/10 and wean off further from there - goal SpO2 90 to 95% - f/u CXR intermittently  Acute metabolic encephalopathy from hypoxia. Hx of polysubstance abuse, ETOH, depression, bipolar. - RASS goal 0 to -1 - continue outpt depakote, prozac, neurontin, zyprexa, trazodone  Hypokalemia, hypophosphatemia. - f/u electrolytes  Anemia of critical illness and chronic disease. - f/u CBC intermittenlty - transfuse for Hb < 7 or significant bleeding  Hx of HTN, [name], Chronic diastolic CHF. - continue outpt ASA, lipitor, lopressor - hold outpt cardizem  Best practice:  Diet: tube feeds once OG tube placed DVT prophylaxis: Lovenox GI prophylaxis: Protonix Mobility: bed rest Code Status: full code Disposition: ICU  Labs    CMP Latest Ref Rng & Units 10/01/2020 09/30/2020 09/29/2020  Glucose 70 - 99 mg/dL 13/05/2020) 237(S) 283(T)  BUN 6 - 20 mg/dL 19 14  10  Creatinine 0.61 - 1.24 mg/dL 5.63(S) 9.37 3.42  Sodium 135 - 145 mmol/L 132(L) 133(L) 130(L)  Potassium 3.5 - 5.1 mmol/L 3.6 3.4(L) 3.4(L)  Chloride 98 - 111 mmol/L 92(L) 90(L) 80(L)  CO2 22 - 32 mmol/L 30 35(H) 40(H)  Calcium 8.9 - 10.3 mg/dL 8.7(G) 8.3(L) 8.8(L)  Total Protein 6.5 - 8.1 g/dL - 5.5(L) -  Total Bilirubin 0.3 - 1.2 mg/dL - 0.5 -  Alkaline Phos 38 - 126 U/L - 50 -  AST 15 - 41 U/L - 24 -  ALT 0 - 44 U/L - 16 -    CBC Latest Ref Rng & Units 10/01/2020 09/30/2020 09/29/2020  WBC 4.0 - 10.5 K/uL 6.6 6.6 7.4   Hemoglobin 13.0 - 17.0 g/dL 8.1(L) 5.7(W) 11.2(L)  Hematocrit 39 - 52 % 31.0(L) 30.4(L) 35.4(L)  Platelets 150 - 400 K/uL 276 263 290    ABG    Component Value Date/Time   PHART 7.428 10/01/2020 0420   PCO2ART 51.2 (H) 10/01/2020 0420   PO2ART 113 (H) 10/01/2020 0420   HCO3 32.0 (H) 10/01/2020 0420   TCO2 34 (H) 12/21/2018 0357   ACIDBASEDEF 0.6 02/17/2019 1850   O2SAT 98.3 10/01/2020 0420    CBG (last 3)  Recent Labs    10/01/20 0459 10/01/20 0825 10/01/20 1133  GLUCAP 105* 115* 130*     Critical care time: 43 minutes  Coralyn Helling, MD [facility] Pager - [phone] 10/01/2020, 1:58 PM

## 2020-10-01 NOTE — Progress Notes (Addendum)
Initial Nutrition Assessment  DOCUMENTATION CODES:      INTERVENTION:  Advance Vital High Protein to goal rate 55 ml/hr (1320 ml) and ProSource TF-45 ml TID via OGT   Tube feeding regimen plus sedation provides 1986 kcal, 148.5 gr of protein, and 1104 ml of H2O.     NUTRITION DIAGNOSIS:   Inadequate oral intake related to inability to eat as evidenced by NPO status.   GOAL:  Provide needs based on ASPEN/SCCM guidelines  MONITOR:  TF tolerance, Vent status, Weight trends, I & O's, Skin, Labs    REASON FOR ASSESSMENT:   Consult, Ventilator Enteral/tube feeding initiation and management  ASSESSMENT:  History of COPD, CHF, HTN, [name], GERD who presents with respiratory distress and AMS.  Patient is currently intubated on ventilator support. Discussed patient with nursing and during rounds today. Patient has been cleared to start tube feeding.   11/9- Patient is tolerating tube feeds at 40 ml/hr per nursing report. His sedative has increased overnight and will adjust tube feeds to prevent overfeeding.   MV: 11.2   L/min Temp (24hrs), Avg:97.7 F (36.5 C), Min:97 F (36.1 C), Max:98.8 F (37.1 C)  Propofol: 20.7 ml/hr (provides-546 kcal q 24 hrs at current rate)   Intake/Output Summary (Last 24 hours) at 10/01/2020 1119 Last data filed at 10/01/2020 0743 Gross per 24 hour  Intake 1129.45 ml  Output 875 ml  Net 254.45 ml   History of COPD, CHF, HTN, [name], GERD who presents with respiratory distress and AMS.  Medications reviewed and include: Vitamin B-12, Folvite, Prozac, Neurontin, Solumedrol, Protonix and Lopressor.  Labs: BMP Latest Ref Rng & Units 10/01/2020 09/30/2020 09/29/2020  Glucose 70 - 99 mg/dL 235(T) 614(E) 315(Q)  BUN 6 - 20 mg/dL 19 14 10   Creatinine 0.61 - 1.24 mg/dL ) 0.08(Q 7.61  Sodium 135 - 145 mmol/L 132(L) 133(L) 130(L)  Potassium 3.5 - 5.1 mmol/L 3.6 3.4(L) 3.4(L)  Chloride 98 - 111 mmol/L 92(L) 90(L) 80(L)  CO2 22 - 32 mmol/L 30 35(H) 40(H)   Calcium 8.9 - 10.3 mg/dL 9.50) 8.3(L) 8.8(L)     NUTRITION - FOCUSED PHYSICAL EXAM:  Unable to complete Nutrition-Focused physical exam at this time.    Diet Order:   Diet Order            Diet NPO time specified  Diet effective now                 EDUCATION NEEDS:  Not appropriate for education at this time   Skin:  Skin Assessment: Reviewed RN Assessment  Last BM:  unknown  Height:   Ht Readings from Last 1 Encounters:  10/01/20 5\' 10"  (1.778 m)    Weight:   Wt Readings from Last 1 Encounters:  10/01/20 88.1 kg    Ideal Body Weight:   75 kg   BMI:  Body mass index is 27.87 kg/m.  Estimated Nutritional Needs:   Kcal:  1930  Protein:  150 gr  Fluid:  >1900 ml daily  MS,RD,CSG,LDN Pager: 13/09/21

## 2020-10-02 ENCOUNTER — Other Ambulatory Visit: Payer: Self-pay

## 2020-10-02 DIAGNOSIS — J441 Chronic obstructive pulmonary disease with (acute) exacerbation: Secondary | ICD-10-CM | POA: Diagnosis not present

## 2020-10-02 DIAGNOSIS — J9621 Acute and chronic respiratory failure with hypoxia: Secondary | ICD-10-CM | POA: Diagnosis not present

## 2020-10-02 DIAGNOSIS — E785 Hyperlipidemia, unspecified: Secondary | ICD-10-CM | POA: Diagnosis not present

## 2020-10-02 DIAGNOSIS — J9622 Acute and chronic respiratory failure with hypercapnia: Secondary | ICD-10-CM | POA: Diagnosis not present

## 2020-10-02 DIAGNOSIS — I5030 Unspecified diastolic (congestive) heart failure: Secondary | ICD-10-CM | POA: Diagnosis not present

## 2020-10-02 LAB — BASIC METABOLIC PANEL
Anion gap: 7 (ref 5–15)
BUN: 22 mg/dL — ABNORMAL HIGH (ref 6–20)
CO2: 32 mmol/L (ref 22–32)
Calcium: 8.3 mg/dL — ABNORMAL LOW (ref 8.9–10.3)
Chloride: 98 mmol/L (ref 98–111)
Creatinine, Ser: 0.5 mg/dL — ABNORMAL LOW (ref 0.61–1.24)
GFR, Estimated: >60 mL/min (ref >60)
Glucose, Bld: 105 mg/dL — ABNORMAL HIGH (ref 70–99)
Potassium: 3.7 mmol/L (ref 3.5–5.1)
Sodium: 137 mmol/L (ref 135–145)

## 2020-10-02 LAB — CBC
HCT: 31.3 % — ABNORMAL LOW (ref 39.0–52.0)
Hemoglobin: 9.7 g/dL — ABNORMAL LOW (ref 13.0–17.0)
MCH: 27.6 pg (ref 26.0–34.0)
MCHC: 31 g/dL (ref 30.0–36.0)
MCV: 89.2 fL (ref 80.0–100.0)
Platelets: 297 10*3/uL (ref 150–400)
RBC: 3.51 MIL/uL — ABNORMAL LOW (ref 4.22–5.81)
RDW: 15.1 % (ref 11.5–15.5)
WBC: 6.3 10*3/uL (ref 4.0–10.5)
nRBC: 0.3 % — ABNORMAL HIGH (ref 0.0–0.2)

## 2020-10-02 LAB — GLUCOSE, CAPILLARY
Glucose-Capillary: 103 mg/dL — ABNORMAL HIGH (ref 70–99)
Glucose-Capillary: 104 mg/dL — ABNORMAL HIGH (ref 70–99)
Glucose-Capillary: 104 mg/dL — ABNORMAL HIGH (ref 70–99)
Glucose-Capillary: 108 mg/dL — ABNORMAL HIGH (ref 70–99)
Glucose-Capillary: 113 mg/dL — ABNORMAL HIGH (ref 70–99)
Glucose-Capillary: 118 mg/dL — ABNORMAL HIGH (ref 70–99)
Glucose-Capillary: 177 mg/dL — ABNORMAL HIGH (ref 70–99)

## 2020-10-02 LAB — PHOSPHORUS: Phosphorus: 3 mg/dL (ref 2.5–4.6)

## 2020-10-02 LAB — MAGNESIUM: Magnesium: 2.2 mg/dL (ref 1.7–2.4)

## 2020-10-02 LAB — PROCALCITONIN: Procalcitonin: <0.1 ng/mL

## 2020-10-02 LAB — LACTIC ACID, PLASMA: Lactic Acid, Venous: 1 mmol/L (ref 0.5–1.9)

## 2020-10-02 LAB — TRIGLYCERIDES: Triglycerides: 101 mg/dL (ref <150)

## 2020-10-02 MED ORDER — FENTANYL CITRATE (PF) 100 MCG/2ML IJ SOLN: 50.0000 ug | INTRAMUSCULAR | Status: DC | PRN

## 2020-10-02 MED ORDER — CHLORHEXIDINE GLUCONATE 0.12 % MT SOLN
15.0000 mL | Freq: Two times a day (BID) | OROMUCOSAL | Status: DC
  Administered 2020-10-03 – 2020-10-09 (×11): 15 mL via OROMUCOSAL
  Filled 2020-10-02 (×7): qty 15

## 2020-10-02 MED ORDER — ORAL CARE MOUTH RINSE
15.0000 mL | Freq: Two times a day (BID) | OROMUCOSAL | Status: DC
  Administered 2020-10-03 – 2020-10-09 (×13): 15 mL via OROMUCOSAL

## 2020-10-02 MED ORDER — OLANZAPINE 5 MG PO TABS: 10.0000 mg | ORAL_TABLET | Freq: Every day | ORAL | Status: DC

## 2020-10-02 MED ORDER — ASPIRIN 81 MG PO CHEW
81.0000 mg | CHEWABLE_TABLET | Freq: Every day | ORAL | Status: DC
  Administered 2020-10-02: 81 mg via ORAL
  Filled 2020-10-02: qty 1

## 2020-10-02 MED ORDER — TRAZODONE HCL 50 MG PO TABS: 50.0000 mg | ORAL_TABLET | Freq: Every day | ORAL | Status: DC

## 2020-10-02 MED ORDER — METHOCARBAMOL 500 MG PO TABS: 500.0000 mg | ORAL_TABLET | Freq: Three times a day (TID) | ORAL | Status: DC | PRN

## 2020-10-02 MED ORDER — PANTOPRAZOLE SODIUM 40 MG IV SOLR
40.0000 mg | INTRAVENOUS | Status: DC
  Administered 2020-10-03 – 2020-10-06 (×4): 40 mg via INTRAVENOUS
  Filled 2020-10-02 (×4): qty 40

## 2020-10-02 MED ORDER — METOPROLOL TARTRATE 5 MG/5ML IV SOLN
2.5000 mg | Freq: Three times a day (TID) | INTRAVENOUS | Status: DC
  Administered 2020-10-02 – 2020-10-07 (×15): 2.5 mg via INTRAVENOUS
  Filled 2020-10-02 (×15): qty 5

## 2020-10-02 MED ORDER — VALPROATE SODIUM 500 MG/5ML IV SOLN
500.0000 mg | Freq: Three times a day (TID) | INTRAVENOUS | Status: DC
  Administered 2020-10-02 – 2020-10-06 (×11): 500 mg via INTRAVENOUS
  Filled 2020-10-02 (×18): qty 5

## 2020-10-02 MED ORDER — ACETAMINOPHEN 325 MG PO TABS
650.0000 mg | ORAL_TABLET | Freq: Four times a day (QID) | ORAL | Status: DC | PRN
  Administered 2020-10-08: 650 mg via ORAL
  Filled 2020-10-02: qty 2

## 2020-10-02 MED ORDER — LORAZEPAM 2 MG/ML IJ SOLN
1.0000 mg | INTRAMUSCULAR | Status: DC | PRN
  Administered 2020-10-03: 2 mg via INTRAVENOUS
  Administered 2020-10-03 (×2): 1 mg via INTRAVENOUS
  Administered 2020-10-04 (×2): 2 mg via INTRAVENOUS
  Filled 2020-10-02 (×5): qty 1

## 2020-10-02 MED ORDER — FOLIC ACID 1 MG PO TABS: 1.0000 mg | ORAL_TABLET | Freq: Every morning | ORAL | Status: DC

## 2020-10-02 MED ORDER — GABAPENTIN 300 MG PO CAPS: 600.0000 mg | ORAL_CAPSULE | Freq: Two times a day (BID) | ORAL | Status: DC

## 2020-10-02 MED ORDER — FLUOXETINE HCL 20 MG PO CAPS: 40.0000 mg | ORAL_CAPSULE | Freq: Every morning | ORAL | Status: DC

## 2020-10-02 MED ORDER — ACETAMINOPHEN 650 MG RE SUPP: 650.0000 mg | RECTAL | Status: DC | PRN

## 2020-10-02 MED ORDER — SODIUM CHLORIDE 0.9 % IV SOLN: INTRAVENOUS | Status: DC

## 2020-10-02 NOTE — Progress Notes (Signed)
At 1500 hr RT extubated patient per MDs order. Patient remained Stable throughout, HR 100 RR 14 and SATs 96% on 4L O2 via nasal cannula. RT gave breathing treatment post extubation with duoneb. RN and MD at bedside no complications noted, RT will continue to monitor and assess

## 2020-10-02 NOTE — Progress Notes (Addendum)
Nutrition follow up     INTERVENTION:  Conitnue Vital High Protein to goal rate 55 ml/hr (1320 ml) and ProSource TF-45 ml TID via OGT   Tube feeding regimen plus sedation provides 1714 kcal, 148.5 gr of protein, and 1104 ml of H2O.     NUTRITION DIAGNOSIS:   Inadequate oral intake related to inability to eat as evidenced by NPO status.   GOAL:  Provide needs based on ASPEN/SCCM guidelines  MONITOR:  TF tolerance, Vent status, Weight trends, I & O's, Skin, Labs   ASSESSMENT:  History of COPD, CHF, HTN, [name], GERD who presents with respiratory distress and AMS. Patient intubated 11/7 on ventilator support. Discussed patient with nursing and during rounds today. Patient has been cleared to start tube feeding.   11/9- Patient is tolerating tube feeds at 40 ml/hr per nursing report. His sedative has increased overnight and will adjust tube feeds to prevent overfeeding.   11/10-Patient is tolerating tube feeds at goal rate. Continue current tube feeding regimen. Sedation decreased this morning- attempt weaning later today. Follow up tomorrow vent status.  MV: 11.2   L/min Temp (24hrs), Avg:98.4 F (36.9 C), Min:97.5 F (36.4 C), Max:99 F (37.2 C)  Propofol: 10.36 ml/hr (provides 274 kcal)   Intake/Output Summary (Last 24 hours) at 10/02/2020 1403 Last data filed at 10/02/2020 0800 Gross per 24 hour  Intake 3571.91 ml  Output 3100 ml  Net 471.91 ml   History of COPD, CHF, HTN, [name], GERD who presents with respiratory distress and AMS.  Medications reviewed and include: Vitamin B-12, Folvite, Prozac, Neurontin, Solumedrol, Protonix and Lopressor.  Labs: BMP Latest Ref Rng & Units 10/02/2020 10/01/2020 09/30/2020  Glucose 70 - 99 mg/dL 086(P) 619(J) 093(O)  BUN 6 - 20 mg/dL 67(T) 19 14  Creatinine 0.61 - 1.24 mg/dL 2.45(Y) 0.99(I) 3.38  Sodium 135 - 145 mmol/L 137 132(L) 133(L)  Potassium 3.5 - 5.1 mmol/L 3.7 3.6 3.4(L)  Chloride 98 - 111 mmol/L 98 92(L) 90(L)  CO2 22 - 32  mmol/L 32 30 35(H)  Calcium 8.9 - 10.3 mg/dL 8.3(L) 8.4(L) 8.3(L)     NUTRITION - FOCUSED PHYSICAL EXAM:  Unable to complete Nutrition-Focused physical exam at this time.    Diet Order:   Diet Order            Diet NPO time specified  Diet effective now                 EDUCATION NEEDS:  Not appropriate for education at this time   Skin:  Skin Assessment: Reviewed RN Assessment  Last BM:  unknown  Height:   Ht Readings from Last 1 Encounters:  10/02/20 5\' 10"  (1.778 m)    Weight:   Wt Readings from Last 1 Encounters:  10/01/20 88.1 kg    Ideal Body Weight:   75 kg   BMI:  Body mass index is 27.87 kg/m.  Estimated Nutritional Needs:   Kcal:  1930  Protein:  150 gr  Fluid:  >1900 ml daily  13/09/21 MS,RD,CSG,LDN Pager: Royann Shivers

## 2020-10-02 NOTE — Progress Notes (Signed)
NAME:  Jeffrey [name], MRN:  [id number], DOB:  [birth year], LOS: 3 ADMISSION DATE:  09/29/2020, CONSULTATION DATE: 09/30/20 REFERRING MD:  Dr. Maurilio Lovely, [facility], CHIEF COMPLAINT:  Respiratory failure   Brief History   59 yo male former smoker with hx of COPD on 3 to 4 liters oxygen at home brought to ER in respiratory distress with altered mental status.  SpO2 78% on room air.  He required intubation in the ER.  He is followed in pulmonary office by Dr. Sherene Sires.    Past Medical History  COPD, Bipolar, Diastolic CHF, Hepatic encephalopathy, Depression, Polysubstance abuse, [name], ETOH, GI bleed, HTN, GERD  Significant Hospital Events   11/07 Admit  Consults:    Procedures:  ETT 11/07 >>  Significant Diagnostic Tests:   PFT 10/09/16 >> 1.84 (49%), FEV1% 48, TLC 7.39 (105%), RV 3.59 (166%), DLCO 36%  A1AT 10/09/16 >> 215, MM  Echo 10/12/19 >> EF 65 to 70%, grade 1 DD, mild/mod LA dilation, mild MR, mild/mod AR  Micro Data:  COVID/Flu 09/29/20 >> negative MRSA PCR 09/29/20 >> negative Blood 09/29/20 >>   Antimicrobials:  Zithromax 11/06  Rocephin 11/06  Vancomycin 11/07  Zosyn 11/07 >> 11/09  Interim history/subjective:  Agitated with WUA again.  Objective   Blood pressure (!) 118/53, pulse 66, temperature 98.2 F (36.8 C), resp. rate 16, height 5\' 10"  (1.778 m), weight 88.1 kg, SpO2 98 %.    Vent Mode: PRVC FiO2 (%):  [40 %] 40 % Set Rate:  [15 bmp-16 bmp] 16 bmp Vt Set:  [550 mL] 550 mL PEEP:  [5 cmH20] 5 cmH20 Plateau Pressure:  [11 cmH20-19 cmH20] 16 cmH20   Intake/Output Summary (Last 24 hours) at 10/02/2020 1353 Last data filed at 10/02/2020 0800 Gross per 24 hour  Intake 3571.91 ml  Output 3100 ml  Net 471.91 ml   Filed Weights   09/29/20 1413 09/30/20 0500 10/01/20 0500  Weight: 88.8 kg 86.3 kg 88.1 kg    Examination:  General - sedated Eyes - pupils reactive ENT - ETT in place Cardiac - regular rate/rhythm, no murmur Chest - equal breath sounds  b/l, no wheezing or rales Abdomen - soft, non tender, + bowel sounds Extremities - no cyanosis, clubbing, or edema Skin - no rashes Neuro - RASS -3   Resolved Hospital Problem list   Lactic acidosis from hypoxia  Assessment & Plan:   Acute on chronic hypoxic/hypercapnic respiratory failure from COPD exacerbation. - able to tolerate pressure support - mental status is main barrier to extubation trial - will have RT arrange extubation set up, cut off sedation and then proceed with extubation - might need Bipap after extubation - continue solumedrol 40 mg bid for now - goal SpO2 90 to 95% - f/u CXR intermittently  Acute metabolic encephalopathy from hypoxia. Hx of polysubstance abuse, ETOH, depression, bipolar. - trying to limit sedation to allow for vent weaning - RASS goal 0 to -1 - might need precedex as transition after extubation - continue outpt depakote, prozac, neurontin, zyprexa, trazodone  Anemia of critical illness and chronic disease. - f/u CBC intermittenlty - transfuse for Hb < 7 or significant bleeding  Hx of HTN, [name], Chronic diastolic CHF. - continue outpt ASA, lipitor, lopressor - hold outpt cardizem  Best practice:  Diet: tube feeds DVT prophylaxis: Lovenox GI prophylaxis: Protonix Mobility: bed rest Code Status: full code Disposition: ICU  Labs    CMP Latest Ref Rng & Units 10/02/2020 10/01/2020 09/30/2020  Glucose 70 -  99 mg/dL 277(O) 242(P) 536(R)  BUN 6 - 20 mg/dL 44(R) 19 14  Creatinine 0.61 - 1.24 mg/dL 1.54(M) 0.86(P) 6.19  Sodium 135 - 145 mmol/L 137 132(L) 133(L)  Potassium 3.5 - 5.1 mmol/L 3.7 3.6 3.4(L)  Chloride 98 - 111 mmol/L 98 92(L) 90(L)  CO2 22 - 32 mmol/L 32 30 35(H)  Calcium 8.9 - 10.3 mg/dL 8.3(L) 8.4(L) 8.3(L)  Total Protein 6.5 - 8.1 g/dL - - 5.5(L)  Total Bilirubin 0.3 - 1.2 mg/dL - - 0.5  Alkaline Phos 38 - 126 U/L - - 50  AST 15 - 41 U/L - - 24  ALT 0 - 44 U/L - - 16    CBC Latest Ref Rng & Units 10/02/2020  10/01/2020 09/30/2020  WBC 4.0 - 10.5 K/uL 6.3 6.6 6.6  Hemoglobin 13.0 - 17.0 g/dL 5.0(D) 3.2(I) 7.1(I)  Hematocrit 39 - 52 % 31.3(L) 31.0(L) 30.4(L)  Platelets 150 - 400 K/uL 297 276 263    ABG    Component Value Date/Time   PHART 7.428 10/01/2020 0420   PCO2ART 51.2 (H) 10/01/2020 0420   PO2ART 113 (H) 10/01/2020 0420   HCO3 32.0 (H) 10/01/2020 0420   TCO2 34 (H) 12/21/2018 0357   ACIDBASEDEF 0.6 02/17/2019 1850   O2SAT 98.3 10/01/2020 0420    CBG (last 3)  Recent Labs    10/02/20 0452 10/02/20 0723 10/02/20 1120  GLUCAP 103* 118* 177*     Critical care time: 33 minutes  Coralyn Helling, MD [facility] Pager - [phone] 10/02/2020, 1:53 PM

## 2020-10-02 NOTE — Progress Notes (Signed)
PROGRESS NOTE    AVANT PRINTY  XLK:[id number] DOB: 1961-08-04 DOA: 09/29/2020 PCP: Lemmie Evens, MD   Brief Narrative:  Per HPI: Alain Marion [name] a 59 y.o.malewith medical history significant forhypertension, hyperlipidemia, COPD(on 3-4 LPM of supplemental oxygen via [place] at baseline), CHF, GERDwho presents to the emergency department via EMS due torespiratory distress. Patient was unable to provide history due to being intubated and sedated. History was obtained from ED physician and from ED medical record. Per report, EMS was activated due to patient having shortness of breath, on arrival of the EMS, patient was noted to be in agonal breathing and was unresponsive after 1 hour of having respiratory difficulty. They could not obtain a lineand patient was assisted with bag ventilation with O2 sat at 85% arrival to the ED. Patient has had prior intubations due to acute hypercapnic respiratory failure secondary to COPD exacerbation.  ED Course: In the emergency department, temperature was 97.3, who presented with altered mental status possibly due to hypoxia, sohewas intubated and sedated. Work-up in the ED showed normal CBC, hyponatremia, hyperglycemia, lactic acid 2.0. ABG showed hypercapnia on FiO2 of 50%. CT of head showed no acute intracranialabnormality. Chest x-ray shows evidence of COPD and possible infiltrate, empiric IV antibiotics with Rocephin and azithromycin were given. Hospitalist was asked to admit patient for further evaluation and management.  -Patient admitted with acute on chronic combined respiratory failure in the setting of COPD exacerbation with associated acute metabolic encephalopathy.  He was noted to have hyponatremia and lactic acidosis which have been improving with IV fluid administration antibiotics.  He is noted to have a low procalcitonin level with no leukocytosis otherwise noted.  Vancomycin was discontinued 11/8 and Zosyn discontinued  11/9.  PCCM ongoing management appreciated with plans to continue to wean off ventilator as tolerated.  Discussed plan to obtain palliative care consultation once extubated to address goals of care.  Family members interested in SNF placement on discharge.  Assessment & Plan:   Active Problems:   Dyslipidemia   COPD exacerbation (HCC)   Essential hypertension   GERD (gastroesophageal reflux disease)   Acute on chronic respiratory failure with hypoxia and hypercapnia (HCC)   Acute metabolic encephalopathy   Lactic acidosis   Hyponatremia   Hyperglycemia   Elevated brain natriuretic peptide (BNP) level   (HFpEF) heart failure with preserved ejection fraction (HCC)   Acute on chronic combined hypoxemic and hypercapnic respiratory failure in the setting of COPD exacerbation -Antibiotics discontinued as procalcitonin low no leukocytosis noted -Continue steroids and breathing treatments as ordered -Appreciate PCCM ongoing evaluation and assistance with extubation when ready  Acute hypercapnic encephalopathy related to above -Continue to monitor once off sedation -pCO2 levels continue to trend down ABG  Lactic acidosis likely related to hypoxemia on admission -Procalcitonin notably low with no leukocytosis so more likely hypoxemia  Hyponatremia-stable -Continue gentle IV fluid with normal saline  Hypokalemia -Repleted, Following  Hyperglycemia -Monitoring closely while on steroids   Essential hypertension -temporarily holding home medications due to soft BP -Plan to resume after extubation -Continue monitoring  GERD -PPI  Dyslipidemia -Resume home medications once able to tolerate oral  History of diastolic CHF -temporarily holding beta-blocker -Resume home medications once able  DVT prophylaxis: Lovenox Code Status: Full code Family Communication: daughter Disposition Plan: anticipate SNF  Status is: Inpatient  Remains inpatient appropriate because:IV  treatments appropriate due to intensity of illness or inability to take PO and Inpatient level of care appropriate due to severity of illness  Dispo: The patient is from: Home  Anticipated d/c is to:  Likely SNF  Anticipated d/c date is: 3 days  Patient currently is not medically stable to d/c.  Patient currently remains intubated and will need extubation and aggressive management of COPD prior to discharge.  Consultants:   PCCM  Procedures:   See below  Antimicrobials:  Anti-infectives (From admission, onward)   Start     Dose/Rate Route Frequency Ordered Stop   09/30/20 0500  vancomycin (VANCOCIN) IVPB 1000 mg/200 mL premix  Status:  Discontinued        1,000 mg 200 mL/hr over 60 Minutes Intravenous Every 12 hours 09/29/20 1459 09/30/20 0654   09/30/20 0300  cefTRIAXone (ROCEPHIN) 1 g in sodium chloride 0.9 % 100 mL IVPB  Status:  Discontinued        1 g 200 mL/hr over 30 Minutes Intravenous Every 24 hours 09/29/20 0638 09/29/20 1341   09/29/20 2300  piperacillin-tazobactam (ZOSYN) IVPB 3.375 g  Status:  Discontinued        3.375 g 12.5 mL/hr over 240 Minutes Intravenous Every 8 hours 09/29/20 1446 10/01/20 0832   09/29/20 1500  vancomycin (VANCOREADY) IVPB 2000 mg/400 mL        2,000 mg 200 mL/hr over 120 Minutes Intravenous  Once 09/29/20 1406 09/29/20 1746   09/29/20 1500  ceFEPIme (MAXIPIME) 2 g in sodium chloride 0.9 % 100 mL IVPB  Status:  Discontinued        2 g 200 mL/hr over 30 Minutes Intravenous Every 8 hours 09/29/20 1406 09/29/20 1446   09/29/20 0315  cefTRIAXone (ROCEPHIN) 1 g in sodium chloride 0.9 % 100 mL IVPB        1 g 200 mL/hr over 30 Minutes Intravenous  Once 09/29/20 0309 09/29/20 0441   09/29/20 0315  azithromycin (ZITHROMAX) 500 mg in sodium chloride 0.9 % 250 mL IVPB  Status:  Discontinued        500 mg 250 mL/hr over 60 Minutes Intravenous Every 24 hours 09/29/20 0309 09/29/20 1341      Subjective: Patient  remains sedated on the ventilator.  Objective: Vitals:   10/02/20 1100 10/02/20 1118 10/02/20 1200 10/02/20 1234  BP: (!) 114/53  (!) 118/53   Pulse: 73 69 66   Resp: 16 16 16    Temp: 98.2 F (36.8 C) 98.2 F (36.8 C) 98.2 F (36.8 C)   TempSrc:  Bladder    SpO2: 98% 99% 99% 98%  Weight:      Height:        Intake/Output Summary (Last 24 hours) at 10/02/2020 1348 Last data filed at 10/02/2020 0800 Gross per 24 hour  Intake 3571.91 ml  Output 3100 ml  Net 471.91 ml   Filed Weights   09/29/20 1413 09/30/20 0500 10/01/20 0500  Weight: 88.8 kg 86.3 kg 88.1 kg   Examination:  General exam: Pt appears critically ill and is sedated on ventilator. Respiratory system: rales heard bilaterally. Respiratory effort normal.  Intubated Cardiovascular system: normal S1 & S2 heard, no M/R/G.  Gastrointestinal system: Abdomen is soft, normal BS heard, no guarding.  Central nervous system: no gross focal deficits.  Extremities: No edema Skin: No gross lesions seen. No rashes, lesions or ulcers Psychiatry: sedated on vent   Data Reviewed: I have personally reviewed following labs and imaging studies  CBC: Recent Labs  Lab 09/29/20 0220 09/29/20 0732 09/30/20 0453 10/01/20 0427 10/02/20 0554  WBC 7.4 7.4 6.6 6.6 6.3  NEUTROABS 5.9  --   --   --   --  HGB 13.3 11.2* 9.8* 9.6* 9.7*  HCT 41.1 35.4* 30.4* 31.0* 31.3*  MCV 87.6 86.6 87.6 88.6 89.2  PLT 326 290 263 276 161   Basic Metabolic Panel: Recent Labs  Lab 09/29/20 0220 09/29/20 0732 09/29/20 1221 09/30/20 0453 10/01/20 0427 10/02/20 0554  NA 129*  --  130* 133* 132* 137  K 3.5  --  3.4* 3.4* 3.6 3.7  CL 75*  --  80* 90* 92* 98  CO2 47*  --  40* 35* 30 32  GLUCOSE 161*  --  118* 115* 107* 105*  BUN 8  --  10 14 19  22*  CREATININE 0.73  --  0.64 0.62 0.52* 0.50*  CALCIUM 9.0  --  8.8* 8.3* 8.4* 8.3*  MG  --  1.8  --   --  2.0 2.2  PHOS  --  <1.0*  --   --  2.4* 3.0   GFR: Estimated Creatinine Clearance:  109.7 mL/min (A) (by C-G formula based on SCr of 0.5 mg/dL (L)). Liver Function Tests: Recent Labs  Lab 09/29/20 0220 09/30/20 0453  AST 44* 24  ALT 24 16  ALKPHOS 81 50  BILITOT 0.6 0.5  PROT 7.6 5.5*  ALBUMIN 3.6 2.5*   No results for input(s): LIPASE, AMYLASE in the last 168 hours. No results for input(s): AMMONIA in the last 168 hours. Coagulation Profile: Recent Labs  Lab 09/30/20 0453  INR 1.0   Cardiac Enzymes: No results for input(s): CKTOTAL, CKMB, CKMBINDEX, TROPONINI in the last 168 hours. BNP (last 3 results) No results for input(s): PROBNP in the last 8760 hours. HbA1C: No results for input(s): HGBA1C in the last 72 hours. CBG: Recent Labs  Lab 10/01/20 2000 10/02/20 0107 10/02/20 0452 10/02/20 0723 10/02/20 1120  GLUCAP 134* 113* 103* 118* 177*   Lipid Profile: Recent Labs    10/01/20 0427 10/02/20 0554  TRIG 164* 101   Thyroid Function Tests: No results for input(s): TSH, T4TOTAL, FREET4, T3FREE, THYROIDAB in the last 72 hours. Anemia Panel: No results for input(s): VITAMINB12, FOLATE, FERRITIN, TIBC, IRON, RETICCTPCT in the last 72 hours. Sepsis Labs: Recent Labs  Lab 09/29/20 0732 09/29/20 0735 09/29/20 1221 09/30/20 0459 10/01/20 0427 10/02/20 0554  PROCALCITON  --  0.13  --   --  <0.10 <0.10  LATICACIDVEN 2.5*  --  2.9* 1.5  --  1.0    Recent Results (from the past 240 hour(s))  Respiratory Panel by RT PCR (Flu A&B, Covid) - Nasopharyngeal Swab     Status: None   Collection Time: 09/29/20  2:20 AM   Specimen: Nasopharyngeal Swab  Result Value Ref Range Status   SARS Coronavirus 2 by RT PCR NEGATIVE NEGATIVE Final    Comment: (NOTE) SARS-CoV-2 target nucleic acids are NOT DETECTED.  The SARS-CoV-2 RNA is generally detectable in upper respiratoy specimens during the acute phase of infection. The lowest concentration of SARS-CoV-2 viral copies this assay can detect is 131 copies/mL. A negative result does not preclude  SARS-Cov-2 infection and should not be used as the sole basis for treatment or other patient management decisions. A negative result may occur with  improper specimen collection/handling, submission of specimen other than nasopharyngeal swab, presence of viral mutation(s) within the areas targeted by this assay, and inadequate number of viral copies (<131 copies/mL). A negative result must be combined with clinical observations, patient history, and epidemiological information. The expected result is Negative.  Fact Sheet for Patients:  PinkCheek.be  Fact Sheet for Healthcare Providers:  GravelBags.it  This test is no t yet approved or cleared by the [place] and  has been authorized for detection and/or diagnosis of SARS-CoV-2 by FDA under an Emergency Use Authorization (EUA). This EUA will remain  in effect (meaning this test can be used) for the duration of the COVID-19 declaration under Section 564(b)(1) of the Act, 21 U.S.C. section 360bbb-3(b)(1), unless the authorization is terminated or revoked sooner.     Influenza A by PCR NEGATIVE NEGATIVE Final   Influenza B by PCR NEGATIVE NEGATIVE Final    Comment: (NOTE) The Xpert Xpress SARS-CoV-2/FLU/RSV assay is intended as an aid in  the diagnosis of influenza from Nasopharyngeal swab specimens and  should not be used as a sole basis for treatment. Nasal washings and  aspirates are unacceptable for Xpert Xpress SARS-CoV-2/FLU/RSV  testing.  Fact Sheet for Patients: PinkCheek.be  Fact Sheet for Healthcare Providers: GravelBags.it  This test is not yet approved or cleared by the [place] FDA and  has been authorized for detection and/or diagnosis of SARS-CoV-2 by  FDA under an Emergency Use Authorization (EUA). This EUA will remain  in effect (meaning this test can be used) for the duration of the   Covid-19 declaration under Section 564(b)(1) of the Act, 21  U.S.C. section 360bbb-3(b)(1), unless the authorization is  terminated or revoked. Performed at [facility], [address] [place], [place] [address]   Blood culture (routine x 2)     Status: None (Preliminary result)   Collection Time: 09/29/20  3:25 AM   Specimen: BLOOD LEFT HAND  Result Value Ref Range Status   Specimen Description BLOOD LEFT HAND  Final   Special Requests   Final    BOTTLES DRAWN AEROBIC AND ANAEROBIC Blood Culture adequate volume   Culture   Final    NO GROWTH 2 DAYS Performed at [facility], [address], [place], [place] [address]    Report Status PENDING  Incomplete  Blood culture (routine x 2)     Status: None (Preliminary result)   Collection Time: 09/29/20  3:26 AM   Specimen: BLOOD RIGHT HAND  Result Value Ref Range Status   Specimen Description BLOOD RIGHT HAND  Final   Special Requests   Final    BOTTLES DRAWN AEROBIC AND ANAEROBIC Blood Culture adequate volume   Culture   Final    NO GROWTH 2 DAYS Performed at [facility], [address], [place], [place] [address]    Report Status PENDING  Incomplete  MRSA PCR Screening     Status: None   Collection Time: 09/29/20  1:42 PM   Specimen: Nasal Mucosa; Nasopharyngeal  Result Value Ref Range Status   MRSA by PCR NEGATIVE NEGATIVE Final    Comment:        The GeneXpert MRSA Assay (FDA approved for NASAL specimens only), is one component of a comprehensive MRSA colonization surveillance program. It is not intended to diagnose MRSA infection nor to guide or monitor treatment for MRSA infections. Performed at [facility], [address], [place], [place] [address]     Radiology Studies: [facility] Chest [facility] 1 View  Result Date: 10/01/2020 CLINICAL DATA:  Intubation EXAM: PORTABLE CHEST 1 VIEW COMPARISON:  Yesterday FINDINGS: Endotracheal tube with tip just below the clavicular heads. The orogastric tube reaches the stomach.  Hyperinflation with diaphragm flattening and scarring or atelectasis at at the lateral left costophrenic sulcus. Generalized reticulonodular density. No visible air leak. Normal heart size. IMPRESSION: Stable hardware  positioning and inflation. Electronically Signed   By: Monte Fantasia M.D.   On: 10/01/2020 04:18   Scheduled Meds: . aspirin  81 mg Oral Daily  . atorvastatin  20 mg Oral QPM  . chlorhexidine gluconate (MEDLINE KIT)  15 mL Mouth Rinse BID  . Chlorhexidine Gluconate Cloth  6 each Topical Q0600  . enoxaparin (LOVENOX) injection  40 mg Subcutaneous Q24H  . feeding supplement (PROSource TF)  45 mL Per Tube TID  . feeding supplement (VITAL HIGH PROTEIN)  1,000 mL Per Tube Q24H  . FLUoxetine  40 mg Per Tube q morning - 20U  . folic acid  1 mg Per Tube q morning - 10a  . gabapentin  600 mg Per Tube BID  . ipratropium-albuterol  3 mL Nebulization Q6H  . mouth rinse  15 mL Mouth Rinse 10 times per day  . methylPREDNISolone (SOLU-MEDROL) injection  40 mg Intravenous Q12H  . metoprolol tartrate  12.5 mg Per Tube BID  . OLANZapine  10 mg Per Tube QHS  . pantoprazole sodium  40 mg Per Tube Daily  . traZODone  50 mg Per Tube QHS  . valproic acid  500 mg Per Tube TID  . vitamin B-12  1,000 mcg Per Tube q morning - 10a   Continuous Infusions: . sodium chloride 50 mL/hr at 10/02/20 0729  . fentaNYL infusion INTRAVENOUS 75 mcg/hr (10/02/20 0924)  . propofol (DIPRIVAN) infusion 20 mcg/kg/min (10/02/20 1255)     LOS: 3 days    Critical Care Procedure Note Authorized and Performed by: Murvin Natal MD  Total Critical Care time:  40 mins Due to a high probability of clinically significant, life threatening deterioration, the patient required my highest level of preparedness to intervene emergently and I personally spent this critical care time directly and personally managing the patient.  This critical care time included obtaining a history; examining the patient, pulse oximetry; ordering  and review of studies; arranging urgent treatment with development of a management plan; evaluation of patient's response of treatment; frequent reassessment; and discussions with other providers.  This critical care time was performed to assess and manage the high probability of imminent and life threatening deterioration that could result in multi-organ failure.  It was exclusive of separately billable procedures and treating other patients and teaching time.   Irwin Brakeman, MD How to contact the [facility] Attending or Consulting provider [place] or covering provider during after hours [place], for this patient?  1. Check the care team in [facility] and look for a) attending/consulting TRH provider listed and b) the [facility] team listed 2. Log into [email or web address] and use [facility] universal password to access. If you do not have the password, please contact the hospital operator. 3. Locate the [facility] provider you are looking for under [facility] and page to a number that you can be directly reached. 4. If you still have difficulty reaching the provider, please page the [name] (Director on Call) for the Hospitalists listed on amion for assistance. ' [facility]  If 7PM-7AM, please contact night-coverage [email or web address] 10/02/2020, 1:48 PM

## 2020-10-03 ENCOUNTER — Encounter (HOSPITAL_COMMUNITY): Payer: Self-pay | Admitting: Internal Medicine

## 2020-10-03 ENCOUNTER — Inpatient Hospital Stay (HOSPITAL_COMMUNITY): Payer: Medicare Other

## 2020-10-03 ENCOUNTER — Inpatient Hospital Stay (HOSPITAL_COMMUNITY)
Admission: EM | Admit: 2020-10-03 | Discharge: 2020-10-03 | Disposition: A | Discharge status: Home / Self Care | Payer: Medicare Other | Source: Home / Self Care | Attending: Family Medicine | Admitting: Family Medicine

## 2020-10-03 ENCOUNTER — Inpatient Hospital Stay: Payer: Self-pay

## 2020-10-03 DIAGNOSIS — J441 Chronic obstructive pulmonary disease with (acute) exacerbation: Secondary | ICD-10-CM | POA: Diagnosis not present

## 2020-10-03 DIAGNOSIS — J9622 Acute and chronic respiratory failure with hypercapnia: Secondary | ICD-10-CM | POA: Diagnosis not present

## 2020-10-03 DIAGNOSIS — G9341 Metabolic encephalopathy: Secondary | ICD-10-CM | POA: Diagnosis not present

## 2020-10-03 DIAGNOSIS — Z515 Encounter for palliative care: Secondary | ICD-10-CM | POA: Diagnosis not present

## 2020-10-03 DIAGNOSIS — E785 Hyperlipidemia, unspecified: Secondary | ICD-10-CM | POA: Diagnosis not present

## 2020-10-03 DIAGNOSIS — Z7189 Other specified counseling: Secondary | ICD-10-CM

## 2020-10-03 DIAGNOSIS — J9621 Acute and chronic respiratory failure with hypoxia: Secondary | ICD-10-CM | POA: Diagnosis not present

## 2020-10-03 DIAGNOSIS — I5030 Unspecified diastolic (congestive) heart failure: Secondary | ICD-10-CM | POA: Diagnosis not present

## 2020-10-03 LAB — CBC
HCT: 34.2 % — ABNORMAL LOW (ref 39.0–52.0)
Hemoglobin: 10.3 g/dL — ABNORMAL LOW (ref 13.0–17.0)
MCH: 28.1 pg (ref 26.0–34.0)
MCHC: 30.1 g/dL (ref 30.0–36.0)
MCV: 93.2 fL (ref 80.0–100.0)
Platelets: 325 10*3/uL (ref 150–400)
RBC: 3.67 MIL/uL — ABNORMAL LOW (ref 4.22–5.81)
RDW: 15.4 % (ref 11.5–15.5)
WBC: 9.5 10*3/uL (ref 4.0–10.5)
nRBC: 0 % (ref 0.0–0.2)

## 2020-10-03 LAB — COMPREHENSIVE METABOLIC PANEL
ALT: 17 U/L (ref 0–44)
AST: 19 U/L (ref 15–41)
Albumin: 2.5 g/dL — ABNORMAL LOW (ref 3.5–5.0)
Alkaline Phosphatase: 48 U/L (ref 38–126)
Anion gap: 7 (ref 5–15)
BUN: 21 mg/dL — ABNORMAL HIGH (ref 6–20)
CO2: 35 mmol/L — ABNORMAL HIGH (ref 22–32)
Calcium: 8.4 mg/dL — ABNORMAL LOW (ref 8.9–10.3)
Chloride: 100 mmol/L (ref 98–111)
Creatinine, Ser: 0.54 mg/dL — ABNORMAL LOW (ref 0.61–1.24)
GFR, Estimated: >60 mL/min (ref >60)
Glucose, Bld: 84 mg/dL (ref 70–99)
Potassium: 4.2 mmol/L (ref 3.5–5.1)
Sodium: 142 mmol/L (ref 135–145)
Total Bilirubin: 0.3 mg/dL (ref 0.3–1.2)
Total Protein: 5.6 g/dL — ABNORMAL LOW (ref 6.5–8.1)

## 2020-10-03 LAB — GLUCOSE, CAPILLARY
Glucose-Capillary: 114 mg/dL — ABNORMAL HIGH (ref 70–99)
Glucose-Capillary: 130 mg/dL — ABNORMAL HIGH (ref 70–99)
Glucose-Capillary: 73 mg/dL (ref 70–99)
Glucose-Capillary: 79 mg/dL (ref 70–99)
Glucose-Capillary: 91 mg/dL (ref 70–99)
Glucose-Capillary: 97 mg/dL (ref 70–99)
Glucose-Capillary: 98 mg/dL (ref 70–99)

## 2020-10-03 LAB — BLOOD GAS, ARTERIAL
Acid-Base Excess: 12.5 mmol/L — ABNORMAL HIGH (ref 0.0–2.0)
Bicarbonate: 34 mmol/L — ABNORMAL HIGH (ref 20.0–28.0)
FIO2: 36
O2 Saturation: 98.2 %
Patient temperature: 37.2
pCO2 arterial: 90.2 mmHg (ref 32.0–48.0)
pH, Arterial: 7.266 — ABNORMAL LOW (ref 7.350–7.450)
pO2, Arterial: 133 mmHg — ABNORMAL HIGH (ref 83.0–108.0)

## 2020-10-03 LAB — MAGNESIUM: Magnesium: 2.2 mg/dL (ref 1.7–2.4)

## 2020-10-03 MED ORDER — DEXMEDETOMIDINE HCL IN NACL 400 MCG/100ML IV SOLN
0.2000 ug/kg/h | INTRAVENOUS | Status: DC
  Administered 2020-10-03: 0.2 ug/kg/h via INTRAVENOUS
  Administered 2020-10-04: 0.5 ug/kg/h via INTRAVENOUS
  Administered 2020-10-04 (×2): 0.4 ug/kg/h via INTRAVENOUS
  Administered 2020-10-05: 0.5 ug/kg/h via INTRAVENOUS
  Administered 2020-10-05: 0.4 ug/kg/h via INTRAVENOUS
  Administered 2020-10-06: 0.5 ug/kg/h via INTRAVENOUS
  Filled 2020-10-03 (×7): qty 100

## 2020-10-03 MED ORDER — DEXTROSE-NACL 5-0.9 % IV SOLN: INTRAVENOUS | Status: DC

## 2020-10-03 NOTE — Progress Notes (Signed)
EEG complete - results pending 

## 2020-10-03 NOTE — Consult Note (Signed)
Consultation Note Date: 10/03/2020   Patient Name: Jeffrey [name]  DOB: Oct 10, 1961  MRN: [id number]  Age / Sex: 59 y.o., male  PCP: Lemmie Evens, MD Referring Physician: Murlean Iba, MD  Reason for Consultation: Establishing goals of care and Psychosocial/spiritual support  HPI/Patient Profile: 59 y.o. male  with past medical history of  HTN/[name], COPD (on 3-4 LPM, CHF, GERD, anxiety/depression, BiPolar, [name]  admitted on 09/29/2020 with acute on chronic hypercapnic/hypercarbic respiratory failure, COPD exacerbation.   Clinical Assessment and Goals of Care: I have reviewed medical records including EPIC notes, labs and imaging, received report from bedside nursing staff, examined the patient.  [name], [name], is lying quietly in bed.  He will briefly open his eyes when I call his name, but not make eye contact.  I do not believe he is able to make his basic needs known.  There is no family at bedside at this time.  Detail conference with bedside nursing staff.  It is reported that Jeffrey [name] is divorced from his wife, Jeffrey [name].  Call made to next of kin, father Jeffrey [name] to discuss diagnosis prognosis, [place], EOL wishes, disposition and options.  I introduced Palliative Medicine as specialized medical care for people living with serious illness. It focuses on providing relief from the symptoms and stress of a serious illness.   We discussed a brief life review of the patient.  Father, Jeffrey [name] shares that Jeffrey [name] worked until about 3 to 4 years ago when he received disability.  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  Jeffrey Medina states that he is unsure if Jeffrey [name] is divorced.  He states that Jeffrey [name] and Jeffrey [name] were married for a long time, and they still live together.  He shares that he is unsure if they are divorced.  We discussed current illness and what it means in the larger  context of on-going co-morbidities.  Natural disease trajectory and expectations at EOL were discussed.    Advanced directives, concepts specific to code status were considered and discussed.  At this point, Juanya Villavicencio states that they would want full scope/full code for [place].  I encouraged him to consider length of time on life support.  We briefly discussed trach at 2 weeks, long-term vent care.  Call to daughter, Jeffrey [name].  [name] tells me that she is Jeffrey [name] [name] is healthcare power of attorney.  She states that he is in fact divorced from Jeffrey [name], who is not her mother, but decided to move back in with her so that he could continue to smoke cigarettes.  He had been at [facility] for an extended amount of time, on life support for around 1 month, and when he discharged he came to [name]'s time. [name] and I talked about extended life support. She tells me that she had been told that he was "brain dead", but is now back to "Dad" after therapy.  She tells me that he has been doing "really good". "He has been  fine".   [name] shares that she believes his decline is related to increasing CO2.  Jeffrey [name] moved back in with former wife Jeffrey [name] so that he could smoke.   4 cigarettes per day, and is noted to smoking "more every day". [name] tells me goes to their home 4-5 times per day.   At this point full scope/full code, all measures to prolong life.    Questions and concerns were addressed.  The family was encouraged to call with questions or concerns.   Conference with attending, bedside nursing staff, transition of care team related to patient condition, needs, goals of care.  HCPOA    HCPOA - Daughter Jeffrey [name].  Jeffrey [name] is divorced from Jeffrey [name].  Father Jeffrey [name] and daughter Jeffrey [name].      SUMMARY OF RECOMMENDATIONS   Continue full scope/full code Family requesting all measures to preserve/prolong life   Code Status/Advance Care Planning:  Full code -[name] tells me  that Mr. Jeffrey [name] has been on ventilator "8-10 times in last 18 months".  She shares that he never remembers life support, and they would want to continue Full scope/Code. Would take all measures to preserve/prolong life.   Symptom Management:   Per hospitalist, no additional needs at this time.  Palliative Prophylaxis:   Frequent Pain Assessment, Oral Care and Turn Reposition  Additional Recommendations (Limitations, Scope, Preferences):  Full Scope Treatment  Psycho-social/Spiritual:   Desire for further Chaplaincy support:no  Additional Recommendations: Caregiving  Support/Resources  Prognosis:   Unable to determine, based on outcomes. 1 year or more would not be surprising based on relatively young age, 1 hospitalization in 6 months.  Discharge Planning: To be determined, based on outcomes.      Primary Diagnoses: Present on Admission: . Acute on chronic respiratory failure with hypoxia and hypercapnia (HCC) . Dyslipidemia . COPD exacerbation ([place]) . Essential hypertension . GERD (gastroesophageal reflux disease) . Acute metabolic encephalopathy   I have reviewed the medical record, interviewed the patient and family, and examined the patient. The following aspects are pertinent.  Past Medical History:  Diagnosis Date  . Acute blood loss anemia 02/07/2017  . Anxiety   . Arthritis    deg disease, bulging disk,  shoulder level  . Bipolar disorder ([place])   . Bipolar disorder ([place])   . CHF (congestive heart failure) ([place])   . COPD, severe ([place]) 10/09/2016  . Depression    anxiety  . Hyperlipidemia   . Hypertension   . Peptic ulcer disease    Review  . Pneumonia   . [name] (peripheral vascular disease) ([place]) 06/18/2015  . Shortness of breath    Social History   Socioeconomic History  . Marital status: Divorced    Spouse name: Not on file  . Number of children: 2  . Years of education: Not on file  . Highest education level: Not on file  Occupational History   . Occupation: Disabled  Tobacco Use  . Smoking status: Former Smoker    Packs/day: 1.00    Years: 40.00    Pack years: 40.00    Types: Cigarettes    Start date: 08/04/1971    Quit date: 09/30/2019    Years since quitting: 1.0  . Smokeless tobacco: Former Systems developer    Quit date: 09/30/2019  . Tobacco comment: peak rate of 2.5ppd, 1/2ppd on 11/27/2016 -- 6 cigarettes / day 01/25/18  Vaping Use  . Vaping Use: Never used  Substance and Sexual Activity  . Alcohol use: No  Alcohol/week: 0.0 standard drinks  . Drug use: Not Currently    Types: Marijuana    Comment: most days   . Sexual activity: Not Currently  Other Topics Concern  . Not on file  Social History Narrative   Originally from [place]. Previously has lived in [place]. 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 Questionable asbestos exposure. Does have significant exposure to fumes. No mold exposure. No bird exposure. No pets currently.  Pt lives alone in [place].   Social Determinants of Health   Financial Resource Strain:   . Difficulty of Paying Living Expenses: Not on file  Food Insecurity:   . Worried About Charity fundraiser in the Last Year: Not on file  . Ran Out of Food in the Last Year: Not on file  Transportation Needs:   . Lack of Transportation (Medical): Not on file  . Lack of Transportation (Non-Medical): Not on file  Physical Activity:   . Days of Exercise per Week: Not on file  . Minutes of Exercise per Session: Not on file  Stress:   . Feeling of Stress : Not on file  Social Connections:   . Frequency of Communication with Friends and Family: Not on file  . Frequency of Social Gatherings with Friends and Family: Not on file  . Attends Religious Services: Not on file  . Active Member of Clubs or Organizations: Not on file  . Attends Archivist Meetings: Not on file  . Marital Status: Not on file   Family History  Problem Relation  Age of Onset  . Breast cancer Mother        deceased  . Heart disease Father   . Depression Daughter   . Anxiety disorder Daughter   . Anxiety disorder Son   . Depression Son   . Asthma Brother   . Heart attack Maternal Aunt   . Heart attack Maternal Uncle   . Heart attack Paternal Aunt   . Heart attack Paternal Uncle   . Heart attack Maternal Grandmother   . Heart attack Maternal Grandfather   . Emphysema Maternal Grandfather   . Heart attack Paternal Grandmother   . Heart attack Paternal Grandfather   . Colon cancer Neg Hx   . Liver disease Neg Hx    Scheduled Meds: . aspirin  81 mg Oral Daily  . atorvastatin  20 mg Oral QPM  . chlorhexidine  15 mL Mouth Rinse BID  . chlorhexidine gluconate (MEDLINE KIT)  15 mL Mouth Rinse BID  . Chlorhexidine Gluconate Cloth  6 each Topical Q0600  . enoxaparin (LOVENOX) injection  40 mg Subcutaneous Q24H  . FLUoxetine  40 mg Oral q morning - 46F  . folic acid  1 mg Oral q morning - 10a  . gabapentin  600 mg Oral BID  . ipratropium-albuterol  3 mL Nebulization Q6H  . mouth rinse  15 mL Mouth Rinse q12n4p  . methylPREDNISolone (SOLU-MEDROL) injection  40 mg Intravenous Q12H  . metoprolol tartrate  2.5 mg Intravenous Q8H  . OLANZapine  10 mg Oral QHS  . pantoprazole (PROTONIX) IV  40 mg Intravenous Q24H  . traZODone  50 mg Oral QHS  . vitamin B-12  1,000 mcg Per Tube q morning - 10a   Continuous Infusions: . sodium chloride 50 mL/hr at 10/03/20 0400  . valproate sodium 500 mg (10/03/20 1330)   PRN Meds:.acetaminophen **OR** acetaminophen, docusate, fentaNYL (  SUBLIMAZE) injection, ipratropium-albuterol, LORazepam, methocarbamol, polyethylene glycol Medications Prior to Admission:  Prior to Admission medications   Medication Sig Start Date End Date Taking? Authorizing Provider  acetaminophen (TYLENOL) 325 MG tablet Take 2 tablets (650 mg total) by mouth every 6 (six) hours as needed for mild pain, fever or headache (or Fever >/= 101).  01/10/20  Yes Emokpae, Courage, MD  albuterol (PROVENTIL) (2.5 MG/3ML) 0.083% nebulizer solution Take 3 mLs (2.5 mg total) by nebulization every 4 (four) hours as needed for wheezing or shortness of breath. 03/29/20  Yes Tanda Rockers, MD  albuterol (VENTOLIN HFA) 108 (90 Base) MCG/ACT inhaler Inhale 2 puffs into the lungs every 4 (four) hours as needed for wheezing or shortness of breath. 12/04/19  Yes Noemi Chapel, MD  aspirin EC 81 MG tablet Take 1 tablet (81 mg total) by mouth daily with breakfast. 01/10/20  Yes Emokpae, Courage, MD  atorvastatin (LIPITOR) 20 MG tablet Take 1 tablet (20 mg total) by mouth daily. For high cholesterol Patient taking differently: Take 20 mg by mouth every evening. For high cholesterol 02/09/18  Yes Money, Lowry Ram, FNP  diltiazem (CARDIZEM CD) 180 MG 24 hr capsule Take 180 mg by mouth daily. 12/25/19  Yes [provider]  divalproex (DEPAKOTE ER) 500 MG 24 hr tablet Take 3 tablets (1,500 mg total) by mouth at bedtime. 08/09/20  Yes Cloria Spring, MD  FLUoxetine (PROZAC) 40 MG capsule Take 1 capsule (40 mg total) by mouth every morning. For mood control 08/09/20  Yes Cloria Spring, MD  fluticasone [facility]) 50 MCG/ACT nasal spray Place 1 spray into both nostrils daily.   Yes [provider]  Fluticasone-Umeclidin-Vilant (TRELEGY ELLIPTA) 100-62.5-25 MCG/INH AEPB Inhale 1 puff into the lungs daily. Patient taking differently: Inhale 2 puffs into the lungs daily.  03/29/20  Yes Tanda Rockers, MD  folic acid (FOLVITE) 1 MG tablet Take 1 mg by mouth every morning.  11/29/19  Yes [provider]  furosemide (LASIX) 40 MG tablet Take 40 mg by mouth daily. 12/25/19  Yes [provider]  gabapentin (NEURONTIN) 300 MG capsule Take 2 capsules (600 mg total) by mouth 2 (two) times daily. 08/09/20  Yes Cloria Spring, MD  guaiFENesin (MUCINEX) 600 MG 12 hr tablet Take 1 tablet (600 mg total) by mouth 2 (two) times daily as needed for cough or to  loosen phlegm. 01/10/20  Yes Roxan Hockey, MD  Multiple Vitamin (MULTIVITAMIN WITH MINERALS) TABS tablet Take 1 tablet by mouth daily. Patient taking differently: Take 1 tablet by mouth every morning.  01/13/19  Yes Mikhail, [place], DO  Multiple Vitamin (MULTIVITAMIN) tablet Take 1 tablet by mouth daily.   Yes [provider]  OLANZapine (ZYPREXA) 10 MG tablet Take 1 tablet (10 mg total) by mouth 2 (two) times daily. 08/09/20 08/09/21 Yes Cloria Spring, MD  oxymetazoline (AFRIN) 0.05 % nasal spray Place 1 spray into both nostrils daily.   Yes [provider]  traZODone (DESYREL) 50 MG tablet Take 1 tablet (50 mg total) by mouth at bedtime. 08/09/20  Yes Cloria Spring, MD  vitamin B-12 (CYANOCOBALAMIN) 500 MCG tablet Take 1,000 mcg by mouth every morning.    Yes [provider]  feeding supplement, ENSURE ENLIVE, (ENSURE ENLIVE) LIQD Take 237 mLs by mouth 3 (three) times daily between meals. Patient not taking: Reported on 10/01/2020 01/10/20   Roxan Hockey, MD  LORazepam (ATIVAN) 1 MG tablet Take 1 tablet (1 mg total) by mouth 2 (  two) times daily. Patient not taking: Reported on 10/01/2020 08/09/20   Cloria Spring, MD  metoprolol tartrate (LOPRESSOR) 25 MG tablet Take 0.5 tablets (12.5 mg total) by mouth 2 (two) times daily. Patient not taking: Reported on 10/01/2020 01/10/20   Roxan Hockey, MD   Allergies  Allergen Reactions  . Augmentin [Amoxicillin-Pot Clavulanate] Rash and Other (See Comments)    Tolerated Zosyn 12/27/2019 Has patient had a PCN reaction causing immediate rash, facial/tongue/throat swelling, SOB or lightheadedness with hypotension: No Has patient had a PCN reaction causing severe rash involving mucus membranes or skin necrosis: No Has patient had a PCN reaction that required hospitalization No Has patient had a PCN reaction occurring within the last 10 years: Yes If all of the above answers are "NO", then may proceed with Cephalosporin use.   . Ace Inhibitors Hives   Review of Systems  Unable to perform ROS: Mental status change    Physical Exam Vitals and nursing note reviewed.  Constitutional:      General: He is not in acute distress.    Appearance: He is obese. He is ill-appearing.  HENT:     Head: Normocephalic and atraumatic.  Cardiovascular:     Rate and Rhythm: Tachycardia present.  Pulmonary:     Comments: BiPAP in place Abdominal:     Palpations: Abdomen is soft.  Musculoskeletal:        General: No swelling.  Skin:    General: Skin is warm and dry.  Neurological:     Comments: Opens eyes when I call his name, but no response     Vital Signs: BP (!) 146/95   Pulse (!) 115   Temp 99.1 F (37.3 C) (Oral)   Resp (!) 25   Ht 5' 10"  (1.778 m)   Wt 85.5 kg   SpO2 95%   BMI 27.05 kg/m  Pain Scale: CPOT   Pain Score: 0-No pain   SpO2: SpO2: 95 % O2 Device:SpO2: 95 % O2 Flow Rate: .O2 Flow Rate (L/min): 4 L/min  IO: Intake/output summary:   Intake/Output Summary (Last 24 hours) at 10/03/2020 1332 Last data filed at 10/03/2020 0400 Gross per 24 hour  Intake 1455.84 ml  Output 1275 ml  Net 180.84 ml    LBM: Last BM Date: 10/02/20 Baseline Weight: Weight: 84.9 kg Most recent weight: Weight: 85.5 kg     Palliative Assessment/Data:   Flowsheet Rows     Most Recent Value  Intake Tab  Referral Department Hospitalist  Unit at Time of Referral ICU  Palliative Care Primary Diagnosis Pulmonary  Date Notified 10/03/20  Palliative Care Type New Palliative care  Reason for referral Clarify Goals of Care  Date of Admission 09/29/20  Date first seen by Palliative Care 10/03/20  # of days Palliative referral response time 0 Day(s)  # of days IP prior to Palliative referral 4  Clinical Assessment  Palliative Performance Scale Score 20%  Pain Max last 24 hours Not able to report  Pain Min Last 24 hours Not able to report  Dyspnea Max Last 24 Hours Not able to report  Dyspnea Min Last 24  hours Not able to report  Psychosocial & Spiritual Assessment  Palliative Care Outcomes      Time In: 1520 Time Out: 1630 Time Total: 70 minutes  Greater than 50%  of this time was spent counseling and coordinating care related to the above assessment and plan.  Signed by: Drue Novel, NP   Please contact Palliative  Medicine Team phone at [phone] for questions and concerns.  For individual provider: See Shea Evans

## 2020-10-03 NOTE — Progress Notes (Signed)
NAME:  Jeffrey [name], MRN:  [id number], DOB:  1961/03/20, LOS: 4 ADMISSION DATE:  09/29/2020, CONSULTATION DATE: 09/30/20 REFERRING MD:  Dr. Maurilio Lovely, [facility], CHIEF COMPLAINT:  Respiratory failure   Brief History   59 yo male former smoker with hx of COPD on 3 to 4 liters oxygen at home brought to ER in respiratory distress with altered mental status.  SpO2 78% on room air.  He required intubation in the ER.  He is followed in pulmonary office by Dr. Sherene Sires.    Past Medical History  COPD, Bipolar, Diastolic CHF, Hepatic encephalopathy, Depression, Polysubstance abuse, [name], ETOH, GI bleed, HTN, GERD  Significant Hospital Events   11/07 Admit 11/08 extubated, needed Bipap after 11/09 altered mental status with ?seizure versus withdrawal  Consults:    Procedures:  ETT 11/07 >> 11/10  Significant Diagnostic Tests:   PFT 10/09/16 >> 1.84 (49%), FEV1% 48, TLC 7.39 (105%), RV 3.59 (166%), DLCO 36%  A1AT 10/09/16 >> 215, MM  Echo 10/12/19 >> EF 65 to 70%, grade 1 DD, mild/mod LA dilation, mild MR, mild/mod AR  MRI brain 10/03/20 >> mild atrophy  EEG 10/03/20 >>   Micro Data:  COVID/Flu 09/29/20 >> negative MRSA PCR 09/29/20 >> negative Blood 09/29/20 >>   Antimicrobials:  Zithromax 11/06  Rocephin 11/06  Vancomycin 11/07  Zosyn 11/07 >> 11/09  Interim history/subjective:  More confused this morning.  On Bipap overnight and through the day.  ?seizure versus myoclonus.  ABG showed increased PCO2.  MRI brain negative.  Seemed to respond to ativan.  Objective   Blood pressure 138/63, pulse (!) 107, temperature 99.1 F (37.3 C), temperature source Oral, resp. rate 16, height 5\' 10"  (1.778 m), weight 85.5 kg, SpO2 99 %.    FiO2 (%):  [40 %] 40 %   Intake/Output Summary (Last 24 hours) at 10/03/2020 1514 Last data filed at 10/03/2020 0400 Gross per 24 hour  Intake 1004.14 ml  Output 1275 ml  Net -270.86 ml   Filed Weights   09/30/20 0500 10/01/20 0500 10/03/20 0400   Weight: 86.3 kg 88.1 kg 85.5 kg    Examination:  General - somnolent Eyes - pupils reactive ENT - Bipap mask on Cardiac - regular rate/rhythm, no murmur Chest - equal breath sounds b/l, no wheezing or rales Abdomen - soft, non tender, + bowel sounds Extremities - no cyanosis, clubbing, or edema Skin - no rashes Neuro - moves extremities, intermittently follows commands (squeezes hand), non verbal  Resolved Hospital Problem list   Lactic acidosis from hypoxia  Assessment & Plan:   Acute on chronic hypoxic/hypercapnic respiratory failure from COPD exacerbation. - continue Bipap - goal SpO2 90 to 95% - continue solumedrol 40 mg bid - scheduled BDs - monitor need for re-intubation - f/u CXR  Acute metabolic encephalopathy from hypoxia. Hx of polysubstance abuse, ETOH, depression, bipolar. - concern that events of 11/11 likely from worsening hypercapnia and alcohol withdrawal - will add precedex for RASS goal 0 to -1 - continue prn ativan, fentanyl - continue valproate IV - f/u EEG - hold outpt prozac, neurontin, zyprexa, trazodone until he is able to take pills  Anemia of critical illness and chronic disease. - f/u CBC intermittenlty - transfuse for Hb < 7 or significant bleeding  Hx of HTN, [name], Chronic diastolic CHF. - continue lopressor IV - hold outpt ASA, lipitor until able to take pills - hold outpt cardizem  Goals of care. - concern that he will continue to have decline  in his respiratory status and that he eventually will become ventilator dependent - agree with palliative care consult to further discuss goals of care  Best practice:  Diet: NPO DVT prophylaxis: Lovenox GI prophylaxis: Protonix Mobility: bed rest Code Status: full code Disposition: ICU  Labs    CMP Latest Ref Rng & Units 10/03/2020 10/02/2020 10/01/2020  Glucose 70 - 99 mg/dL 84 009(Q) 330(Q)  BUN 6 - 20 mg/dL 76(A) 26(J) 19  Creatinine 0.61 - 1.24 mg/dL 3.35(K) 5.62(B) 6.38(L)   Sodium 135 - 145 mmol/L 142 137 132(L)  Potassium 3.5 - 5.1 mmol/L 4.2 3.7 3.6  Chloride 98 - 111 mmol/L 100 98 92(L)  CO2 22 - 32 mmol/L 35(H) 32 30  Calcium 8.9 - 10.3 mg/dL 3.7(D) 8.3(L) 8.4(L)  Total Protein 6.5 - 8.1 g/dL 4.2(A) - -  Total Bilirubin 0.3 - 1.2 mg/dL 0.3 - -  Alkaline Phos 38 - 126 U/L 48 - -  AST 15 - 41 U/L 19 - -  ALT 0 - 44 U/L 17 - -    CBC Latest Ref Rng & Units 10/03/2020 10/02/2020 10/01/2020  WBC 4.0 - 10.5 K/uL 9.5 6.3 6.6  Hemoglobin 13.0 - 17.0 g/dL 10.3(L) 9.7(L) 9.6(L)  Hematocrit 39 - 52 % 34.2(L) 31.3(L) 31.0(L)  Platelets 150 - 400 K/uL 325 297 276    ABG    Component Value Date/Time   PHART 7.266 (L) 10/03/2020 1104   PCO2ART 90.2 (HH) 10/03/2020 1104   PO2ART 133 (H) 10/03/2020 1104   HCO3 34.0 (H) 10/03/2020 1104   TCO2 34 (H) 12/21/2018 0357   ACIDBASEDEF 0.6 02/17/2019 1850   O2SAT 98.2 10/03/2020 1104    CBG (last 3)  Recent Labs    10/03/20 0810 10/03/20 0958 10/03/20 1101  GLUCAP 97 130* 114*     Critical care time: 34 minutes  Coralyn Helling, MD [place] [facility] Pager - [phone] 10/03/2020, 3:14 PM

## 2020-10-03 NOTE — Progress Notes (Signed)
Spoke with Morrie Sheldon, RN regarding PICC order.  Made aware that IV team will not be on campus again today therefore it will be tomorrow before able to place.  Aware that [facility] will need to be contacted for placement if unable to wait until then.  [facility] will follow up 10-04-20.

## 2020-10-03 NOTE — Consult Note (Signed)
Jeffrey A. Merlene Laughter, MD     [email or web address]          Jeffrey [name] is an 59 y.o. male.   ASSESSMENT/PLAN: METABOLIC ENCEPHALOPATHY MOSTLY DUE TO HYPOXIA: Continue with supportive care.  The patient presents with acute dyspnea. He does have a baseline history of severe COPD. The patient was noted to have elevated pCO2 of 90 and hypoxia. He has been placed on BiPAP machine. Patient has been unresponsive and became significantly unresponsive this morning resulting in the neurological consultation. No clear evidence of focal deficits, seizures or other clear problems neurologically. Review of systems is not possible given the stuporous condition of the patient.  GENERAL: The patient is on a BiPAP machine and the dyspneic.  HEENT: Neck is supple no trauma noted.  ABDOMEN: Soft  EXTREMITIES: Significant edema of the upper extremities. This results in left movement of the upper extremities.  BACK: Normal alignment.  SKIN: Normal by inspection.    MENTAL STATUS: He opens his eyes to painful stimuli but does not track. He appears to follow commands once in the lower extremities. CRANIAL NERVES: Pupils are equal, round and reactive to light; extraocular movements are full, there is no significant nystagmus; upper and lower facial muscles are normal in strength and symmetric, there is no flattening of the nasolabial folds.  MOTOR: Upper extremity strength 2/5 in lower extremities 3/5.  COORDINATION: No tremors, dysmetria, parkinsonism or myoclonus noted.  REFLEXES: Deep tendon reflexes are symmetrical and normal.   SENSATION: Response to painful stimuli bilaterally.    Blood pressure (!) 152/47, pulse (!) 109, temperature 99.4 F (37.4 C), temperature source Axillary, resp. rate (!) 30, height 5' 10"  (1.778 m), weight 85.5 kg, SpO2 96 %.  Past Medical History:  Diagnosis Date  . Acute blood loss anemia 02/07/2017  . Anxiety   . Arthritis    deg disease,  bulging disk,  shoulder level  . Bipolar disorder ([place])   . Bipolar disorder ([place])   . CHF (congestive heart failure) ([place])   . COPD, severe ([place]) 10/09/2016  . Depression    anxiety  . Hyperlipidemia   . Hypertension   . Peptic ulcer disease    Review  . Pneumonia   . [name] (peripheral vascular disease) ([place]) 06/18/2015  . Shortness of breath     Past Surgical History:  Procedure Laterality Date  . BIOPSY  02/09/2017   Procedure: BIOPSY;  Surgeon: Danie Binder, MD;  [facility];  Service: Endoscopy;;  duodenal gastric  . COLONOSCOPY  03/14/2007   AVW:[id number] colonoscopy and terminal ileoscopy except external hemorrhoids  . COLONOSCOPY N/A 05/05/2013   Dr. Gala Romney: external/internal anal canal hemorrhoids, unable to intubate TI, segemental biopsies unremarkable   . COLONOSCOPY N/A 04/11/2017   Procedure: COLONOSCOPY;  Surgeon: Danie Binder, MD;  [facility];  Service: Endoscopy;  Laterality: N/A;  . COLONOSCOPY WITH PROPOFOL N/A 03/31/2017   Procedure: COLONOSCOPY WITH PROPOFOL;  Surgeon: Daneil Dolin, MD;  [facility];  Service: Endoscopy;  Laterality: N/A;  1:45pm  . ESOPHAGOGASTRODUODENOSCOPY  03/14/2007   [id number] antral gastritis with bulbar duodenitis/paucity to postbulbar duodenal folds and biopsy were benign with no evidence of villous atrophy.  . ESOPHAGOGASTRODUODENOSCOPY (EGD) WITH PROPOFOL N/A 02/09/2017   Procedure: ESOPHAGOGASTRODUODENOSCOPY (EGD) WITH PROPOFOL;  Surgeon: Danie Binder, MD;  [facility];  Service: Endoscopy;  Laterality: N/A;  . GIVENS CAPSULE STUDY N/A 04/08/2017   Procedure: GIVENS CAPSULE STUDY;  Surgeon: Daneil Dolin, MD;  [facility];  Service: Endoscopy;  Laterality: N/A;  . HAND SURGERY     left, secondary to self-inflicted laceration  . HEMORRHOID SURGERY N/A 04/14/2017   Procedure: HEMORRHOIDECTOMY;  Surgeon: Aviva Signs, MD;  Location: [facility];  Service: General;   Laterality: N/A;  . SHOULDER SURGERY     right  . TOE SURGERY     left great toe , amputated-lawnmover accident    Family History  Problem Relation Age of Onset  . Breast cancer Mother        deceased  . Heart disease Father   . Depression Daughter   . Anxiety disorder Daughter   . Anxiety disorder Son   . Depression Son   . Asthma Brother   . Heart attack Maternal Aunt   . Heart attack Maternal Uncle   . Heart attack Paternal Aunt   . Heart attack Paternal Uncle   . Heart attack Maternal Grandmother   . Heart attack Maternal Grandfather   . Emphysema Maternal Grandfather   . Heart attack Paternal Grandmother   . Heart attack Paternal Grandfather   . Colon cancer Neg Hx   . Liver disease Neg Hx     Social History:  reports that he quit smoking about a year ago. His smoking use included cigarettes. He started smoking about 49 years ago. He has a 40.00 pack-year smoking history. He quit smokeless tobacco use about a year ago. He reports previous drug use. Drug: Marijuana. He reports that he does not drink alcohol.  Allergies:  Allergies  Allergen Reactions  . Augmentin [Amoxicillin-Pot Clavulanate] Rash and Other (See Comments)    Tolerated Zosyn 12/27/2019 Has patient had a PCN reaction causing immediate rash, facial/tongue/throat swelling, SOB or lightheadedness with hypotension: No Has patient had a PCN reaction causing severe rash involving mucus membranes or skin necrosis: No Has patient had a PCN reaction that required hospitalization No Has patient had a PCN reaction occurring within the last 10 years: Yes If all of the above answers are "NO", then may proceed with Cephalosporin use.  . Ace Inhibitors Hives    Medications: Prior to Admission medications   Medication Sig Start Date End Date Taking? Authorizing Provider  acetaminophen (TYLENOL) 325 MG tablet Take 2 tablets (650 mg total) by mouth every 6 (six) hours as needed for mild pain, fever or headache (or Fever  >/= 101). 01/10/20  Yes Emokpae, Courage, MD  albuterol (PROVENTIL) (2.5 MG/3ML) 0.083% nebulizer solution Take 3 mLs (2.5 mg total) by nebulization every 4 (four) hours as needed for wheezing or shortness of breath. 03/29/20  Yes Tanda Rockers, MD  albuterol (VENTOLIN HFA) 108 (90 Base) MCG/ACT inhaler Inhale 2 puffs into the lungs every 4 (four) hours as needed for wheezing or shortness of breath. 12/04/19  Yes Noemi Chapel, MD  aspirin EC 81 MG tablet Take 1 tablet (81 mg total) by mouth daily with breakfast. 01/10/20  Yes Emokpae, Courage, MD  atorvastatin (LIPITOR) 20 MG tablet Take 1 tablet (20 mg total) by mouth daily. For high cholesterol Patient taking differently: Take 20 mg by mouth every evening. For high cholesterol 02/09/18  Yes Money, Lowry Ram, FNP  diltiazem (CARDIZEM CD) 180 MG 24 hr capsule Take 180 mg by mouth daily. 12/25/19  Yes [provider]  divalproex (DEPAKOTE ER) 500 MG 24 hr tablet Take 3 tablets (1,500 mg total) by mouth at bedtime. 08/09/20  Yes Cloria Spring, MD  FLUoxetine (PROZAC) 40 MG capsule Take 1 capsule (40 mg total) by mouth every morning. For mood control 08/09/20  Yes Cloria Spring, MD  fluticasone [facility]) 50 MCG/ACT nasal spray Place 1 spray into both nostrils daily.   Yes [provider]  Fluticasone-Umeclidin-Vilant (TRELEGY ELLIPTA) 100-62.5-25 MCG/INH AEPB Inhale 1 puff into the lungs daily. Patient taking differently: Inhale 2 puffs into the lungs daily.  03/29/20  Yes Tanda Rockers, MD  folic acid (FOLVITE) 1 MG tablet Take 1 mg by mouth every morning.  11/29/19  Yes [provider]  furosemide (LASIX) 40 MG tablet Take 40 mg by mouth daily. 12/25/19  Yes [provider]  gabapentin (NEURONTIN) 300 MG capsule Take 2 capsules (600 mg total) by mouth 2 (two) times daily. 08/09/20  Yes Cloria Spring, MD  guaiFENesin (MUCINEX) 600 MG 12 hr tablet Take 1 tablet (600 mg total) by mouth 2 (two) times daily as needed for cough  or to loosen phlegm. 01/10/20  Yes Roxan Hockey, MD  Multiple Vitamin (MULTIVITAMIN WITH MINERALS) TABS tablet Take 1 tablet by mouth daily. Patient taking differently: Take 1 tablet by mouth every morning.  01/13/19  Yes Mikhail, [place], DO  Multiple Vitamin (MULTIVITAMIN) tablet Take 1 tablet by mouth daily.   Yes [provider]  OLANZapine (ZYPREXA) 10 MG tablet Take 1 tablet (10 mg total) by mouth 2 (two) times daily. 08/09/20 08/09/21 Yes Cloria Spring, MD  oxymetazoline (AFRIN) 0.05 % nasal spray Place 1 spray into both nostrils daily.   Yes [provider]  traZODone (DESYREL) 50 MG tablet Take 1 tablet (50 mg total) by mouth at bedtime. 08/09/20  Yes Cloria Spring, MD  vitamin B-12 (CYANOCOBALAMIN) 500 MCG tablet Take 1,000 mcg by mouth every morning.    Yes [provider]  feeding supplement, ENSURE ENLIVE, (ENSURE ENLIVE) LIQD Take 237 mLs by mouth 3 (three) times daily between meals. Patient not taking: Reported on 10/01/2020 01/10/20   Roxan Hockey, MD  LORazepam (ATIVAN) 1 MG tablet Take 1 tablet (1 mg total) by mouth 2 (two) times daily. Patient not taking: Reported on 10/01/2020 08/09/20   Cloria Spring, MD  metoprolol tartrate (LOPRESSOR) 25 MG tablet Take 0.5 tablets (12.5 mg total) by mouth 2 (two) times daily. Patient not taking: Reported on 10/01/2020 01/10/20   Roxan Hockey, MD    Scheduled Meds: . aspirin  81 mg Oral Daily  . atorvastatin  20 mg Oral QPM  . chlorhexidine  15 mL Mouth Rinse BID  . chlorhexidine gluconate (MEDLINE KIT)  15 mL Mouth Rinse BID  . Chlorhexidine Gluconate Cloth  6 each Topical Q0600  . enoxaparin (LOVENOX) injection  40 mg Subcutaneous Q24H  . FLUoxetine  40 mg Oral q morning - 96P  . folic acid  1 mg Oral q morning - 10a  . gabapentin  600 mg Oral BID  . ipratropium-albuterol  3 mL Nebulization Q6H  . mouth rinse  15 mL Mouth Rinse q12n4p  . methylPREDNISolone (SOLU-MEDROL) injection  40 mg Intravenous  Q12H  . metoprolol tartrate  2.5 mg Intravenous Q8H  . OLANZapine  10 mg Oral QHS  . pantoprazole (PROTONIX) IV  40 mg Intravenous Q24H  . traZODone  50 mg Oral QHS  . vitamin B-12  1,000 mcg Per Tube q morning - 10a   Continuous Infusions: . sodium chloride 50 mL/hr at 10/03/20 0400  . dexmedetomidine (PRECEDEX) IV infusion    . valproate sodium 500  mg (10/03/20 1330)   PRN Meds:.acetaminophen **OR** acetaminophen, docusate, fentaNYL (SUBLIMAZE) injection, ipratropium-albuterol, LORazepam, methocarbamol, polyethylene glycol     Results for orders placed or performed during the hospital encounter of 09/29/20 (from the past 48 hour(s))  Glucose, capillary     Status: Abnormal   Collection Time: 10/01/20  8:00 PM  Result Value Ref Range   Glucose-Capillary 134 (H) 70 - 99 mg/dL    Comment: Glucose reference range applies only to samples taken after fasting for at least 8 hours.  Glucose, capillary     Status: Abnormal   Collection Time: 10/02/20  1:07 AM  Result Value Ref Range   Glucose-Capillary 113 (H) 70 - 99 mg/dL    Comment: Glucose reference range applies only to samples taken after fasting for at least 8 hours.  Glucose, capillary     Status: Abnormal   Collection Time: 10/02/20  4:52 AM  Result Value Ref Range   Glucose-Capillary 103 (H) 70 - 99 mg/dL    Comment: Glucose reference range applies only to samples taken after fasting for at least 8 hours.  Procalcitonin     Status: None   Collection Time: 10/02/20  5:54 AM  Result Value Ref Range   Procalcitonin <0.10 ng/mL    Comment:        Interpretation: PCT (Procalcitonin) <= 0.5 ng/mL: Systemic infection (sepsis) is not likely. Local bacterial infection is possible. (NOTE)       Sepsis PCT Algorithm           Lower Respiratory Tract                                      Infection PCT Algorithm    ----------------------------     ----------------------------         PCT < 0.25 ng/mL                PCT < 0.10  ng/mL          Strongly encourage             Strongly discourage   discontinuation of antibiotics    initiation of antibiotics    ----------------------------     -----------------------------       PCT 0.25 - 0.50 ng/mL            PCT 0.10 - 0.25 ng/mL               OR       >80% decrease in PCT            Discourage initiation of                                            antibiotics      Encourage discontinuation           of antibiotics    ----------------------------     -----------------------------         PCT >= 0.50 ng/mL              PCT 0.26 - 0.50 ng/mL               AND        <80% decrease in PCT             Encourage initiation  of                                             antibiotics       Encourage continuation           of antibiotics    ----------------------------     -----------------------------        PCT >= 0.50 ng/mL                  PCT > 0.50 ng/mL               AND         increase in PCT                  Strongly encourage                                      initiation of antibiotics    Strongly encourage escalation           of antibiotics                                     -----------------------------                                           PCT <= 0.25 ng/mL                                                 OR                                        > 80% decrease in PCT                                      Discontinue / Do not initiate                                             antibiotics  Performed at [facility], [address], [place], [place] [address]   Triglycerides     Status: None   Collection Time: 10/02/20  5:54 AM  Result Value Ref Range   Triglycerides 101 <150 mg/dL    Comment: Performed at [facility], [address], [place], [place] [address]  CBC     Status: Abnormal   Collection Time: 10/02/20  5:54 AM  Result Value Ref Range   WBC 6.3 4.0 - 10.5 K/uL   RBC 3.51 (L) 4.22 - 5.81 MIL/uL   Hemoglobin 9.7 (L) 13.0 -  17.0 g/dL   HCT 31.3 (L) 39 - 52 %   MCV 89.2 80.0 - 100.0 fL   MCH 27.6 26.0 - 34.0  pg   MCHC 31.0 30.0 - 36.0 g/dL   RDW 15.1 11.5 - 15.5 %   Platelets 297 150 - 400 K/uL   nRBC 0.3 (H) 0.0 - 0.2 %    Comment: Performed at [facility], [address], [place], [place] [address]  Basic metabolic panel     Status: Abnormal   Collection Time: 10/02/20  5:54 AM  Result Value Ref Range   Sodium 137 135 - 145 mmol/L   Potassium 3.7 3.5 - 5.1 mmol/L   Chloride 98 98 - 111 mmol/L   CO2 32 22 - 32 mmol/L   Glucose, Bld 105 (H) 70 - 99 mg/dL    Comment: Glucose reference range applies only to samples taken after fasting for at least 8 hours.   BUN 22 (H) 6 - 20 mg/dL   Creatinine, Ser 0.50 (L) 0.61 - 1.24 mg/dL   Calcium 8.3 (L) 8.9 - 10.3 mg/dL   GFR, Estimated >60 >60 mL/min    Comment: (NOTE) Calculated using the CKD-EPI Creatinine Equation (2021)    Anion gap 7 5 - 15    Comment: Performed at [facility], [address] [place], [place] [address]  Magnesium     Status: None   Collection Time: 10/02/20  5:54 AM  Result Value Ref Range   Magnesium 2.2 1.7 - 2.4 mg/dL    Comment: Performed at [facility], [address], [place], [place] [address]  Lactic acid, plasma     Status: None   Collection Time: 10/02/20  5:54 AM  Result Value Ref Range   Lactic Acid, Venous 1.0 0.5 - 1.9 mmol/L    Comment: Performed at [facility], [address], [place], [place] [address]  Phosphorus     Status: None   Collection Time: 10/02/20  5:54 AM  Result Value Ref Range   Phosphorus 3.0 2.5 - 4.6 mg/dL    Comment: Performed at [facility], [address], [place], [place] [address]  Glucose, capillary     Status: Abnormal   Collection Time: 10/02/20  7:23 AM  Result Value Ref Range   Glucose-Capillary 118 (H) 70 - 99 mg/dL    Comment: Glucose reference range applies only to samples taken after fasting for at least 8 hours.  Glucose, capillary     Status: Abnormal   Collection Time: 10/02/20  11:20 AM  Result Value Ref Range   Glucose-Capillary 177 (H) 70 - 99 mg/dL    Comment: Glucose reference range applies only to samples taken after fasting for at least 8 hours.  Glucose, capillary     Status: Abnormal   Collection Time: 10/02/20  3:58 PM  Result Value Ref Range   Glucose-Capillary 104 (H) 70 - 99 mg/dL    Comment: Glucose reference range applies only to samples taken after fasting for at least 8 hours.  Glucose, capillary     Status: Abnormal   Collection Time: 10/02/20  8:45 PM  Result Value Ref Range   Glucose-Capillary 108 (H) 70 - 99 mg/dL    Comment: Glucose reference range applies only to samples taken after fasting for at least 8 hours.  Glucose, capillary     Status: Abnormal   Collection Time: 10/02/20 11:59 PM  Result Value Ref Range   Glucose-Capillary 104 (H) 70 - 99 mg/dL    Comment: Glucose reference range applies only to samples taken after fasting for at least 8 hours.  Glucose, capillary     Status: None  Collection Time: 10/03/20  5:21 AM  Result Value Ref Range   Glucose-Capillary 73 70 - 99 mg/dL    Comment: Glucose reference range applies only to samples taken after fasting for at least 8 hours.  Comprehensive metabolic panel     Status: Abnormal   Collection Time: 10/03/20  5:34 AM  Result Value Ref Range   Sodium 142 135 - 145 mmol/L   Potassium 4.2 3.5 - 5.1 mmol/L   Chloride 100 98 - 111 mmol/L   CO2 35 (H) 22 - 32 mmol/L   Glucose, Bld 84 70 - 99 mg/dL    Comment: Glucose reference range applies only to samples taken after fasting for at least 8 hours.   BUN 21 (H) 6 - 20 mg/dL   Creatinine, Ser 0.54 (L) 0.61 - 1.24 mg/dL   Calcium 8.4 (L) 8.9 - 10.3 mg/dL   Total Protein 5.6 (L) 6.5 - 8.1 g/dL   Albumin 2.5 (L) 3.5 - 5.0 g/dL   AST 19 15 - 41 U/L   ALT 17 0 - 44 U/L   Alkaline Phosphatase 48 38 - 126 U/L   Total Bilirubin 0.3 0.3 - 1.2 mg/dL   GFR, Estimated >60 >60 mL/min    Comment: (NOTE) Calculated using the CKD-EPI  Creatinine Equation (2021)    Anion gap 7 5 - 15    Comment: Performed at [facility], [address] [place], [place] [address]  Magnesium     Status: None   Collection Time: 10/03/20  5:34 AM  Result Value Ref Range   Magnesium 2.2 1.7 - 2.4 mg/dL    Comment: Performed at [facility], [address], [place], [place] [address]  CBC     Status: Abnormal   Collection Time: 10/03/20  5:34 AM  Result Value Ref Range   WBC 9.5 4.0 - 10.5 K/uL   RBC 3.67 (L) 4.22 - 5.81 MIL/uL   Hemoglobin 10.3 (L) 13.0 - 17.0 g/dL   HCT 34.2 (L) 39 - 52 %   MCV 93.2 80.0 - 100.0 fL   MCH 28.1 26.0 - 34.0 pg   MCHC 30.1 30.0 - 36.0 g/dL   RDW 15.4 11.5 - 15.5 %   Platelets 325 150 - 400 K/uL   nRBC 0.0 0.0 - 0.2 %    Comment: Performed at [facility], [address] [place], [place] [address]  Glucose, capillary     Status: None   Collection Time: 10/03/20  8:10 AM  Result Value Ref Range   Glucose-Capillary 97 70 - 99 mg/dL    Comment: Glucose reference range applies only to samples taken after fasting for at least 8 hours.  Glucose, capillary     Status: Abnormal   Collection Time: 10/03/20  9:58 AM  Result Value Ref Range   Glucose-Capillary 130 (H) 70 - 99 mg/dL    Comment: Glucose reference range applies only to samples taken after fasting for at least 8 hours.  Glucose, capillary     Status: Abnormal   Collection Time: 10/03/20 11:01 AM  Result Value Ref Range   Glucose-Capillary 114 (H) 70 - 99 mg/dL    Comment: Glucose reference range applies only to samples taken after fasting for at least 8 hours.  Blood gas, arterial     Status: Abnormal   Collection Time: 10/03/20 11:04 AM  Result Value Ref Range   FIO2 36.00    pH, Arterial 7.266 (L) 7.35 - 7.45   pCO2 arterial 90.2 (HH)  32 - 48 mmHg    Comment: CRITICAL RESULT CALLED TO, READ BACK BY AND VERIFIED WITH: WILSON,A AT 11:15AM ON 10/03/20 BY FESTERMAN,C    pO2, Arterial 133 (H) 83 - 108 mmHg   Bicarbonate 34.0 (H) 20.0 - 28.0  mmol/L   Acid-Base Excess 12.5 (H) 0.0 - 2.0 mmol/L   O2 Saturation 98.2 %   Patient temperature 37.2    Allens test (pass/fail) PASS PASS    Comment: Performed at [facility], [address], [place], [place] [address]  Glucose, capillary     Status: None   Collection Time: 10/03/20  4:41 PM  Result Value Ref Range   Glucose-Capillary 79 70 - 99 mg/dL    Comment: Glucose reference range applies only to samples taken after fasting for at least 8 hours.    Studies/Results:  BRAIN MRI FINDINGS: Please note that image quality is degraded by motion artifact. Examination was terminated early secondary to patient disposition. Limited sequence acquisition limits evaluation.  Brain: No diffusion-weighted signal abnormality. No intracranial hemorrhage. No midline shift, ventriculomegaly or extra-axial fluid collection. No mass lesion. Mild diffuse cerebral atrophy with ex vacuo dilatation.  Vascular: Normal flow voids.  Skull and upper cervical spine: Normal marrow signal. Chronic medial left orbital wall posttraumatic deformity.  Sinuses/Orbits: Normal orbits. Clear paranasal sinuses. Bilateral mastoid effusions.  Other: None.  IMPRESSION: No acute intracranial process.  Mild cerebral atrophy.  Limited sequence acquisition and motion artifact limits evaluation.    Limited sequences done on the MRI which is reviewed in person. No acute changes are noted on DWI. There is mild global atrophy and the white matter is normal. No encephalomalacia noted.       Markcus Lazenby A. Merlene [name], M.D.  Diplomate, Tax adviser of Psychiatry and Neurology ( Neurology). 10/03/2020, 5:39 PM

## 2020-10-03 NOTE — Progress Notes (Signed)
Nutrition Follow-up  DOCUMENTATION CODES:   Not applicable  INTERVENTION:  Continue to follow for diet advancement, will provide nutrition supplements as appropriate  Monitor for GOC   NUTRITION DIAGNOSIS:   Inadequate oral intake related to inability to eat as evidenced by NPO status. -ongoing  GOAL:   Provide needs based on ASPEN/SCCM guidelines -met  MONITOR:   TF tolerance, Vent status, Weight trends, I & O's, Skin, Labs  REASON FOR ASSESSMENT:   Consult, Ventilator Enteral/tube feeding initiation and management  ASSESSMENT:  History of COPD, CHF, HTN, [name], GERD who presents with respiratory distress and AMS.   Patient admitted with acute on chronic combined respiratory failure in the setting of COPD exacerbation with acute associated metabolic encephalopathy.  11/7-intubated 11/8 -TF started 11/10-extubated  Patient on BiPAP overnight, has remained confused and poorly responsive today. He only responds to name, not following commands. Concerns for continual decline in respiratory status, eventually becoming ventilator dependent per CCM. Palliative consulted to further discuss goals of care.   He is NPO at this time. Will continue to follow for diet advancement and provide nutrition supplements as appropriate.  Admit wt 84.9 kg           Current wt 85.5 kg  Medications reviewed and include: Methylprednisolone, Folic acid, Gabapentin, B12, Drips: Depacon Precedex -to start @ 7622  IVF: NaCl @ 50 ml/hr  Labs: CBGs 114,130   Diet Order:   Diet Order            Diet NPO time specified  Diet effective now                 EDUCATION NEEDS:   Not appropriate for education at this time  Skin:  Skin Assessment: Reviewed RN Assessment  Last BM:  11/11-type 7  Height:   Ht Readings from Last 1 Encounters:  10/02/20 5' 10"  (1.778 m)    Weight:   Wt Readings from Last 1 Encounters:  10/03/20 85.5 kg    BMI:  Body mass index is 27.05  kg/m.  Estimated Nutritional Needs:   Kcal:  1930  Protein:  150  Fluid:  >1900 ml daily   Lajuan Lines, RD, LDN Clinical Nutrition After Hours/Weekend Pager # in [place]

## 2020-10-03 NOTE — Progress Notes (Signed)
PROGRESS NOTE    Jeffrey [name]  QBV:[id number] DOB: 02-06-1961 DOA: 09/29/2020 PCP: Lemmie Evens, MD   Brief Narrative:  Per HPI: Jeffrey [name] a 59 y.o.malewith medical history significant forhypertension, hyperlipidemia, COPD(on 3-4 LPM of supplemental oxygen via [place] at baseline), CHF, GERDwho presents to the emergency department via EMS due torespiratory distress. Patient was unable to provide history due to being intubated and sedated. History was obtained from ED physician and from ED medical record. Per report, EMS was activated due to patient having shortness of breath, on arrival of the EMS, patient was noted to be in agonal breathing and was unresponsive after 1 hour of having respiratory difficulty. They could not obtain a lineand patient was assisted with bag ventilation with O2 sat at 85% arrival to the ED. Patient has had prior intubations due to acute hypercapnic respiratory failure secondary to COPD exacerbation.  ED Course: In the emergency department, temperature was 97.3, who presented with altered mental status possibly due to hypoxia, sohewas intubated and sedated. Work-up in the ED showed normal CBC, hyponatremia, hyperglycemia, lactic acid 2.0. ABG showed hypercapnia on FiO2 of 50%. CT of head showed no acute intracranialabnormality. Chest x-ray shows evidence of COPD and possible infiltrate, empiric IV antibiotics with Rocephin and azithromycin were given. Hospitalist was asked to admit patient for further evaluation and management.  -Patient admitted with acute on chronic combined respiratory failure in the setting of COPD exacerbation with associated acute metabolic encephalopathy.  He was noted to have hyponatremia and lactic acidosis which have been improving with IV fluid administration antibiotics.  He is noted to have a low procalcitonin level with no leukocytosis otherwise noted.  Vancomycin was discontinued 11/8 and Zosyn discontinued  11/9.  PCCM ongoing management appreciated with plans to continue to wean off ventilator as tolerated.  Discussed plan to obtain palliative care consultation once extubated to address goals of care.  Family members interested in SNF placement on discharge.  Assessment & Plan:   Active Problems:   Dyslipidemia   COPD exacerbation (HCC)   Essential hypertension   GERD (gastroesophageal reflux disease)   Acute on chronic respiratory failure with hypoxia and hypercapnia (HCC)   Acute metabolic encephalopathy   Lactic acidosis   Hyponatremia   Hyperglycemia   Elevated brain natriuretic peptide (BNP) level   (HFpEF) heart failure with preserved ejection fraction (HCC)   Acute on chronic combined hypoxemic and hypercapnic respiratory failure in the setting of COPD exacerbation -Antibiotics discontinued as procalcitonin low no leukocytosis noted -Continue steroids and breathing treatments as ordered -Appreciate PCCM ongoing evaluation and assistance with extubation when ready  Acute hypercapnic encephalopathy related to above -Continue to monitor once off sedation -pCO2 levels continue to trend down ABG  Lactic acidosis likely related to hypoxemia on admission -Procalcitonin notably low with no leukocytosis so more likely hypoxemia  Hyponatremia-resolved -Treated with IV fluid with normal saline  Hypokalemia -Repleted, Following  Hyperglycemia -Monitoring closely while on steroids   Essential hypertension -temporarily holding home medications due to soft BP -Continue monitoring  GERD -PPI  Dyslipidemia -Resume home medications once able to tolerate oral  History of diastolic CHF -temporarily holding beta-blocker -Resume home medications once able  DVT prophylaxis: Lovenox Code Status: Full code Family Communication: daughter Disposition Plan: anticipate SNF  Status is: Inpatient  Remains inpatient appropriate because:IV treatments appropriate due to  intensity of illness or inability to take PO and Inpatient level of care appropriate due to severity of illness  Dispo: The patient is  from: Home  Anticipated d/c is to:  Likely SNF  Anticipated d/c date is: >3 days  Patient currently is not medically stable to d/c.  Patient has poor mentation, not eating or drinking, being worked up for altered mentation  Consultants:   PCCM  Procedures:   See below  Antimicrobials:  Anti-infectives (From admission, onward)   Start     Dose/Rate Route Frequency Ordered Stop   09/30/20 0500  vancomycin (VANCOCIN) IVPB 1000 mg/200 mL premix  Status:  Discontinued        1,000 mg 200 mL/hr over 60 Minutes Intravenous Every 12 hours 09/29/20 1459 09/30/20 0654   09/30/20 0300  cefTRIAXone (ROCEPHIN) 1 g in sodium chloride 0.9 % 100 mL IVPB  Status:  Discontinued        1 g 200 mL/hr over 30 Minutes Intravenous Every 24 hours 09/29/20 0638 09/29/20 1341   09/29/20 2300  piperacillin-tazobactam (ZOSYN) IVPB 3.375 g  Status:  Discontinued        3.375 g 12.5 mL/hr over 240 Minutes Intravenous Every 8 hours 09/29/20 1446 10/01/20 0832   09/29/20 1500  vancomycin (VANCOREADY) IVPB 2000 mg/400 mL        2,000 mg 200 mL/hr over 120 Minutes Intravenous  Once 09/29/20 1406 09/29/20 1746   09/29/20 1500  ceFEPIme (MAXIPIME) 2 g in sodium chloride 0.9 % 100 mL IVPB  Status:  Discontinued        2 g 200 mL/hr over 30 Minutes Intravenous Every 8 hours 09/29/20 1406 09/29/20 1446   09/29/20 0315  cefTRIAXone (ROCEPHIN) 1 g in sodium chloride 0.9 % 100 mL IVPB        1 g 200 mL/hr over 30 Minutes Intravenous  Once 09/29/20 0309 09/29/20 0441   09/29/20 0315  azithromycin (ZITHROMAX) 500 mg in sodium chloride 0.9 % 250 mL IVPB  Status:  Discontinued        500 mg 250 mL/hr over 60 Minutes Intravenous Every 24 hours 09/29/20 0309 09/29/20 1341      Subjective: Patient remains confused, poorly responsive, only responds to  name, not following commands  Objective: Vitals:   10/03/20 0437 10/03/20 0759 10/03/20 0800 10/03/20 0830  BP: (!) 137/116  (!) 132/57   Pulse: 94  (!) 108 (!) 107  Resp: 14  (!) 22 18  Temp:   98.4 F (36.9 C) 99 F (37.2 C)  TempSrc:      SpO2: 94% 97% 97% 97%  Weight:      Height:        Intake/Output Summary (Last 24 hours) at 10/03/2020 0959 Last data filed at 10/03/2020 0400 Gross per 24 hour  Intake 1455.84 ml  Output 1275 ml  Net 180.84 ml   Filed Weights   09/30/20 0500 10/01/20 0500 10/03/20 0400  Weight: 86.3 kg 88.1 kg 85.5 kg   Examination:  General exam: Pt appears critically ill and remains mostly obtunded, responded only to name Respiratory system: rales heard bilaterally poor air movement. Moderated increased work of breathing. Cardiovascular system: tachycardic, normal S1 & S2 heard, no M/R/G.  Gastrointestinal system: Abdomen is soft, normal BS heard, no guarding.  Central nervous system: no gross focal deficits.  Extremities: No edema Skin: No gross lesions seen. No rashes, lesions or ulcers Psychiatry: unable to assess as he is not responsive  Data Reviewed: I have personally reviewed following labs and imaging studies  CBC: Recent Labs  Lab 09/29/20 0220 09/29/20 0220 09/29/20 0732 09/30/20 0453 10/01/20 0427 10/02/20  2683 10/03/20 0534  WBC 7.4   < > 7.4 6.6 6.6 6.3 9.5  NEUTROABS 5.9  --   --   --   --   --   --   HGB 13.3   < > 11.2* 9.8* 9.6* 9.7* 10.3*  HCT 41.1   < > 35.4* 30.4* 31.0* 31.3* 34.2*  MCV 87.6   < > 86.6 87.6 88.6 89.2 93.2  PLT 326   < > 290 263 276 297 325   < > = values in this interval not displayed.   Basic Metabolic Panel: Recent Labs  Lab 09/29/20 0220 09/29/20 0732 09/29/20 1221 09/30/20 0453 10/01/20 0427 10/02/20 0554 10/03/20 0534  NA   < >  --  130* 133* 132* 137 142  K   < >  --  3.4* 3.4* 3.6 3.7 4.2  CL   < >  --  80* 90* 92* 98 100  CO2   < >  --  40* 35* 30 32 35*  GLUCOSE   < >  --   118* 115* 107* 105* 84  BUN   < >  --  _0 22* 21*  CREATININE   < >  --  0.64 0.62 0.52* 0.50* 0.54*  CALCIUM   < >  --  8.8* 8.3* 8.4* 8.3* 8.4*  MG  --  1.8  --   --  2.0 2.2 2.2  PHOS  --  <1.0*  --   --  2.4* 3.0  --    < > = values in this interval not displayed.   GFR: Estimated Creatinine Clearance: 101.4 mL/min (A) (by C-G formula based on SCr of 0.54 mg/dL (L)). Liver Function Tests: Recent Labs  Lab 09/29/20 0220 09/30/20 0453 10/03/20 0534  AST 44* 24 19  ALT _1 ALKPHOS 81 50 48  BILITOT 0.6 0.5 0.3  PROT 7.6 5.5* 5.6*  ALBUMIN 3.6 2.5* 2.5*   No results for input(s): LIPASE, AMYLASE in the last 168 hours. No results for input(s): AMMONIA in the last 168 hours. Coagulation Profile: Recent Labs  Lab 09/30/20 0453  INR 1.0   Cardiac Enzymes: No results for input(s): CKTOTAL, CKMB, CKMBINDEX, TROPONINI in the last 168 hours. BNP (last 3 results) No results for input(s): PROBNP in the last 8760 hours. HbA1C: No results for input(s): HGBA1C in the last 72 hours. CBG: Recent Labs  Lab 10/02/20 2045 10/02/20 2359 10/03/20 0521 10/03/20 0810 10/03/20 0958  GLUCAP 108* 104* 73 97 130*   Lipid Profile: Recent Labs    10/01/20 0427 10/02/20 0554  TRIG 164* 101   Thyroid Function Tests: No results for input(s): TSH, T4TOTAL, FREET4, T3FREE, THYROIDAB in the last 72 hours. Anemia Panel: No results for input(s): VITAMINB12, FOLATE, FERRITIN, TIBC, IRON, RETICCTPCT in the last 72 hours. Sepsis Labs: Recent Labs  Lab 09/29/20 0732 09/29/20 0735 09/29/20 1221 09/30/20 0459 10/01/20 0427 10/02/20 0554  PROCALCITON  --  0.13  --   --  <0.10 <0.10  LATICACIDVEN 2.5*  --  2.9* 1.5  --  1.0    Recent Results (from the past 240 hour(s))  Respiratory Panel by RT PCR (Flu A&B, Covid) - Nasopharyngeal Swab     Status: None   Collection Time: 09/29/20  2:20 AM   Specimen: Nasopharyngeal Swab  Result Value Ref Range Status   SARS Coronavirus 2  by RT PCR NEGATIVE NEGATIVE Final    Comment: (NOTE) SARS-CoV-2 target nucleic acids are  NOT DETECTED.  The SARS-CoV-2 RNA is generally detectable in upper respiratoy specimens during the acute phase of infection. The lowest concentration of SARS-CoV-2 viral copies this assay can detect is 131 copies/mL. A negative result does not preclude SARS-Cov-2 infection and should not be used as the sole basis for treatment or other patient management decisions. A negative result may occur with  improper specimen collection/handling, submission of specimen other than nasopharyngeal swab, presence of viral mutation(s) within the areas targeted by this assay, and inadequate number of viral copies (<131 copies/mL). A negative result must be combined with clinical observations, patient history, and epidemiological information. The expected result is Negative.  Fact Sheet for Patients:  PinkCheek.be  Fact Sheet for Healthcare Providers:  GravelBags.it  This test is no t yet approved or cleared by the [place] FDA and  has been authorized for detection and/or diagnosis of SARS-CoV-2 by FDA under an Emergency Use Authorization (EUA). This EUA will remain  in effect (meaning this test can be used) for the duration of the COVID-19 declaration under Section 564(b)(1) of the Act, 21 U.S.C. section 360bbb-3(b)(1), unless the authorization is terminated or revoked sooner.     Influenza A by PCR NEGATIVE NEGATIVE Final   Influenza B by PCR NEGATIVE NEGATIVE Final    Comment: (NOTE) The Xpert Xpress SARS-CoV-2/FLU/RSV assay is intended as an aid in  the diagnosis of influenza from Nasopharyngeal swab specimens and  should not be used as a sole basis for treatment. Nasal washings and  aspirates are unacceptable for Xpert Xpress SARS-CoV-2/FLU/RSV  testing.  Fact Sheet for Patients: PinkCheek.be  Fact Sheet  for Healthcare Providers: GravelBags.it  This test is not yet approved or cleared by the [place] FDA and  has been authorized for detection and/or diagnosis of SARS-CoV-2 by  FDA under an Emergency Use Authorization (EUA). This EUA will remain  in effect (meaning this test can be used) for the duration of the  Covid-19 declaration under Section 564(b)(1) of the Act, 21  U.S.C. section 360bbb-3(b)(1), unless the authorization is  terminated or revoked. Performed at [facility], [address], [place], [place] [address]   Blood culture (routine x 2)     Status: None (Preliminary result)   Collection Time: 09/29/20  3:25 AM   Specimen: BLOOD LEFT HAND  Result Value Ref Range Status   Specimen Description BLOOD LEFT HAND  Final   Special Requests   Final    BOTTLES DRAWN AEROBIC AND ANAEROBIC Blood Culture adequate volume   Culture   Final    NO GROWTH 4 DAYS Performed at [facility], [address], [place], [place] [address]    Report Status PENDING  Incomplete  Blood culture (routine x 2)     Status: None (Preliminary result)   Collection Time: 09/29/20  3:26 AM   Specimen: BLOOD RIGHT HAND  Result Value Ref Range Status   Specimen Description BLOOD RIGHT HAND  Final   Special Requests   Final    BOTTLES DRAWN AEROBIC AND ANAEROBIC Blood Culture adequate volume   Culture   Final    NO GROWTH 4 DAYS Performed at [facility], [address] [place], [place] [address]    Report Status PENDING  Incomplete  MRSA PCR Screening     Status: None   Collection Time: 09/29/20  1:42 PM   Specimen: Nasal Mucosa; Nasopharyngeal  Result Value Ref Range Status   MRSA by PCR NEGATIVE NEGATIVE Final    Comment:  The GeneXpert MRSA Assay (FDA approved for NASAL specimens only), is one component of a comprehensive MRSA colonization surveillance program. It is not intended to diagnose MRSA infection nor to guide or monitor treatment for MRSA  infections. Performed at [facility], [address] [place], [place] [address]     Radiology Studies: No results found. Scheduled Meds: . aspirin  81 mg Oral Daily  . atorvastatin  20 mg Oral QPM  . chlorhexidine  15 mL Mouth Rinse BID  . chlorhexidine gluconate (MEDLINE KIT)  15 mL Mouth Rinse BID  . Chlorhexidine Gluconate Cloth  6 each Topical Q0600  . enoxaparin (LOVENOX) injection  40 mg Subcutaneous Q24H  . FLUoxetine  40 mg Oral q morning - 75T  . folic acid  1 mg Oral q morning - 10a  . gabapentin  600 mg Oral BID  . ipratropium-albuterol  3 mL Nebulization Q6H  . mouth rinse  15 mL Mouth Rinse q12n4p  . methylPREDNISolone (SOLU-MEDROL) injection  40 mg Intravenous Q12H  . metoprolol tartrate  2.5 mg Intravenous Q8H  . OLANZapine  10 mg Oral QHS  . pantoprazole (PROTONIX) IV  40 mg Intravenous Q24H  . traZODone  50 mg Oral QHS  . vitamin B-12  1,000 mcg Per Tube q morning - 10a   Continuous Infusions: . sodium chloride 50 mL/hr at 10/03/20 0400  . sodium chloride    . valproate sodium 500 mg (10/03/20 0524)     LOS: 4 days    Critical Care Procedure Note Authorized and Performed by: Murvin Natal MD  Total Critical Care time:  37 mins Due to a high probability of clinically significant, life threatening deterioration, the patient required my highest level of preparedness to intervene emergently and I personally spent this critical care time directly and personally managing the patient.  This critical care time included obtaining a history; examining the patient, pulse oximetry; ordering and review of studies; arranging urgent treatment with development of a management plan; evaluation of patient's response of treatment; frequent reassessment; and discussions with other providers.  This critical care time was performed to assess and manage the high probability of imminent and life threatening deterioration that could result in multi-organ failure.  It was exclusive of  separately billable procedures and treating other patients and teaching time.   Irwin Brakeman, MD How to contact the [facility] Attending or Consulting provider [place] or covering provider during after hours [place], for this patient?  1. Check the care team in [facility] and look for a) attending/consulting [facility] provider listed and b) the [facility] team listed 2. Log into [email or web address] and use [place]'s universal password to access. If you do not have the password, please contact the hospital operator. 3. Locate the [facility] provider you are looking for under [facility] and page to a number that you can be directly reached. 4. If you still have difficulty reaching the provider, please page the [facility] (Director on Call) for the Hospitalists listed on amion for assistance. ' [facility]  If 7PM-7AM, please contact night-coverage [email or web address] 10/03/2020, 9:59 AM

## 2020-10-03 NOTE — Progress Notes (Signed)
PT Cancellation Note  Patient Details Name: Jeffrey [name] MRN: [id number] DOB: [birth year]   Cancelled Treatment:    Reason Eval/Treat Not Completed: Medical issues which prohibited therapy.  Physical therapy held secondary to patient non-responsive at this time.  Will check back when patient is able to participate.    3:14 PM, 10/03/20 Ocie Bob, MPT Physical Therapist with [facility] [phone] office [phone] mobile phone

## 2020-10-03 NOTE — Progress Notes (Signed)
MRI brain and ABG ordered on patient, d/t altered mental status and lack of following commands. Patient's CO2 level came back at 90.2. Dr. Laural Benes notified and respiratory notified. Respiratory placed patient back on bipap. Dr. Craige Cotta to come see patient later today. EEG also ordered with a neurology consult for possible seizure activity.

## 2020-10-04 ENCOUNTER — Inpatient Hospital Stay (HOSPITAL_COMMUNITY): Payer: Medicare Other

## 2020-10-04 DIAGNOSIS — I5032 Chronic diastolic (congestive) heart failure: Secondary | ICD-10-CM

## 2020-10-04 DIAGNOSIS — I5030 Unspecified diastolic (congestive) heart failure: Secondary | ICD-10-CM | POA: Diagnosis not present

## 2020-10-04 DIAGNOSIS — J9621 Acute and chronic respiratory failure with hypoxia: Secondary | ICD-10-CM | POA: Diagnosis not present

## 2020-10-04 DIAGNOSIS — J441 Chronic obstructive pulmonary disease with (acute) exacerbation: Secondary | ICD-10-CM | POA: Diagnosis not present

## 2020-10-04 DIAGNOSIS — J9622 Acute and chronic respiratory failure with hypercapnia: Secondary | ICD-10-CM | POA: Diagnosis not present

## 2020-10-04 DIAGNOSIS — Z7189 Other specified counseling: Secondary | ICD-10-CM

## 2020-10-04 DIAGNOSIS — G9341 Metabolic encephalopathy: Secondary | ICD-10-CM | POA: Diagnosis not present

## 2020-10-04 LAB — COMPREHENSIVE METABOLIC PANEL
ALT: 23 U/L (ref 0–44)
AST: 29 U/L (ref 15–41)
Albumin: 2.5 g/dL — ABNORMAL LOW (ref 3.5–5.0)
Alkaline Phosphatase: 54 U/L (ref 38–126)
Anion gap: 10 (ref 5–15)
BUN: 25 mg/dL — ABNORMAL HIGH (ref 6–20)
CO2: 39 mmol/L — ABNORMAL HIGH (ref 22–32)
Calcium: 8.9 mg/dL (ref 8.9–10.3)
Chloride: 97 mmol/L — ABNORMAL LOW (ref 98–111)
Creatinine, Ser: 0.49 mg/dL — ABNORMAL LOW (ref 0.61–1.24)
GFR, Estimated: >60 mL/min (ref >60)
Glucose, Bld: 95 mg/dL (ref 70–99)
Potassium: 3.9 mmol/L (ref 3.5–5.1)
Sodium: 146 mmol/L — ABNORMAL HIGH (ref 135–145)
Total Bilirubin: 0.6 mg/dL (ref 0.3–1.2)
Total Protein: 5.6 g/dL — ABNORMAL LOW (ref 6.5–8.1)

## 2020-10-04 LAB — CBC
HCT: 33.2 % — ABNORMAL LOW (ref 39.0–52.0)
Hemoglobin: 9.9 g/dL — ABNORMAL LOW (ref 13.0–17.0)
MCH: 27.7 pg (ref 26.0–34.0)
MCHC: 29.8 g/dL — ABNORMAL LOW (ref 30.0–36.0)
MCV: 92.7 fL (ref 80.0–100.0)
Platelets: 280 10*3/uL (ref 150–400)
RBC: 3.58 MIL/uL — ABNORMAL LOW (ref 4.22–5.81)
RDW: 14.1 % (ref 11.5–15.5)
WBC: 7.2 10*3/uL (ref 4.0–10.5)
nRBC: 0 % (ref 0.0–0.2)

## 2020-10-04 LAB — ECHOCARDIOGRAM COMPLETE
AV Mean grad: 3.3 mmHg
AV Peak grad: 6.2 mmHg
Ao pk vel: 1.24 m/s
Area-P 1/2: 2.93 cm2
Height: 70 in
MV M vel: 5.21 m/s
MV Peak grad: 108.6 mmHg
S' Lateral: 3.97 cm
Weight: 2998.26 oz

## 2020-10-04 LAB — BLOOD GAS, ARTERIAL
Acid-Base Excess: 14.4 mmol/L — ABNORMAL HIGH (ref 0.0–2.0)
Bicarbonate: 36.8 mmol/L — ABNORMAL HIGH (ref 20.0–28.0)
FIO2: 40
O2 Saturation: 96.9 %
Patient temperature: 37.1
pCO2 arterial: 68.3 mmHg (ref 32.0–48.0)
pH, Arterial: 7.387 (ref 7.350–7.450)
pO2, Arterial: 90.2 mmHg (ref 83.0–108.0)

## 2020-10-04 LAB — GLUCOSE, CAPILLARY
Glucose-Capillary: 87 mg/dL (ref 70–99)
Glucose-Capillary: 96 mg/dL (ref 70–99)
Glucose-Capillary: 103 mg/dL — ABNORMAL HIGH (ref 70–99)
Glucose-Capillary: 98 mg/dL (ref 70–99)
Glucose-Capillary: 114 mg/dL — ABNORMAL HIGH (ref 70–99)

## 2020-10-04 LAB — MAGNESIUM: Magnesium: 2.1 mg/dL (ref 1.7–2.4)

## 2020-10-04 MED ORDER — DEXTROSE 5 % IV SOLN: INTRAVENOUS | Status: DC

## 2020-10-04 MED ORDER — SODIUM CHLORIDE 0.9% FLUSH
10.0000 mL | Freq: Two times a day (BID) | INTRAVENOUS | Status: DC
  Administered 2020-10-04 – 2020-10-09 (×9): 10 mL

## 2020-10-04 MED ORDER — METHYLPREDNISOLONE SODIUM SUCC 40 MG IJ SOLR
20.0000 mg | Freq: Two times a day (BID) | INTRAMUSCULAR | Status: DC
  Administered 2020-10-04 – 2020-10-05 (×2): 20 mg via INTRAVENOUS
  Filled 2020-10-04 (×2): qty 1

## 2020-10-04 MED ORDER — FUROSEMIDE 10 MG/ML IJ SOLN
40.0000 mg | Freq: Four times a day (QID) | INTRAMUSCULAR | Status: AC
  Administered 2020-10-04 (×2): 40 mg via INTRAVENOUS
  Filled 2020-10-04 (×2): qty 4

## 2020-10-04 MED ORDER — SODIUM CHLORIDE 0.9% FLUSH: 10.0000 mL | INTRAVENOUS | Status: DC | PRN

## 2020-10-04 NOTE — Progress Notes (Signed)
PROGRESS NOTE    Jeffrey [name]  DXI:[id number] DOB: February 21, 1961 DOA: 09/29/2020 PCP: Jeffrey Evens, MD   Brief Narrative:  Per HPI: Jeffrey [name] a 59 y.o.malewith medical history significant forhypertension, hyperlipidemia, COPD(on 3-4 LPM of supplemental oxygen via [place] at baseline), CHF, GERDwho presents to the emergency department via EMS due torespiratory distress. Patient was unable to provide history due to being intubated and sedated. History was obtained from ED physician and from ED medical record. Per report, EMS was activated due to patient having shortness of breath, on arrival of the EMS, patient was noted to be in agonal breathing and was unresponsive after 1 hour of having respiratory difficulty. They could not obtain a lineand patient was assisted with bag ventilation with O2 sat at 85% arrival to the ED. Patient has had prior intubations due to acute hypercapnic respiratory failure secondary to COPD exacerbation.  ED Course: In the emergency department, temperature was 97.3, who presented with altered mental status possibly due to hypoxia, sohewas intubated and sedated. Work-up in the ED showed normal CBC, hyponatremia, hyperglycemia, lactic acid 2.0. ABG showed hypercapnia on FiO2 of 50%. CT of head showed no acute intracranialabnormality. Chest x-ray shows evidence of COPD and possible infiltrate, empiric IV antibiotics with Rocephin and azithromycin were given. Hospitalist was asked to admit patient for further evaluation and management.  -Patient admitted with acute on chronic combined respiratory failure in the setting of COPD exacerbation with associated acute metabolic encephalopathy.  He was noted to have hyponatremia and lactic acidosis which have been improving with IV fluid administration antibiotics.  He is noted to have a low procalcitonin level with no leukocytosis otherwise noted.  Vancomycin was discontinued 11/8 and Zosyn discontinued  11/9.  PCCM ongoing management appreciated with plans to continue to wean off ventilator as tolerated.  Discussed plan to obtain palliative care consultation once extubated to address goals of care.  Family members interested in SNF placement on discharge. However with no improvement in mentation and poor respiratory status may need trach and LTAC.   11/12: after conferencing with PCCM decision made to transfer to [facility] as patient not having any meaningful improvement and high risk for re-intubation and need for trach.  He will be on PCCM service at [facility].    Assessment & Plan:   Active Problems:   Dyslipidemia   COPD exacerbation (HCC)   Essential hypertension   GERD (gastroesophageal reflux disease)   Acute on chronic respiratory failure with hypoxia and hypercapnia (HCC)   Acute metabolic encephalopathy   Lactic acidosis   Hyponatremia   Hyperglycemia   Elevated brain natriuretic peptide (BNP) level   (HFpEF) heart failure with preserved ejection fraction (HCC)   DNR (do not resuscitate) discussion   Acute on chronic combined hypoxemic and hypercapnic respiratory failure in the setting of COPD exacerbation -Antibiotics discontinued as procalcitonin low no leukocytosis noted -Continue steroids and breathing treatments as ordered -Appreciate PCCM following, Recommending transfer to [facility] to PCCM service due to high risk for reintubation and need for tracheostomy  Acute hypercapnic encephalopathy related to above -Continue to monitor once off sedation -pCO2 levels slightly improved but remains obtunded with high risk for re-intubation  Lactic acidosis likely related to hypoxemia on admission -Procalcitonin notably low with no leukocytosis so more likely hypoxemia  Hyponatremia-resolved -Treated with IV fluid with normal saline  Acute pulmonary edema - PCCM ordered IV lasix 11/12.   Hypokalemia -Repleted, Following  Hyperglycemia - much improved -Monitoring closely    Essential hypertension -  temporarily holding home medications due to soft BP -Continue monitoring  GERD -PPI  Dyslipidemia -Resume home medications once able to tolerate oral  History of diastolic CHF -temporarily holding beta-blocker -Resume home medications once able -IV lasix given 11/12.   DVT prophylaxis: Lovenox Code Status: Full code - family  Family Communication: daughter telephone Disposition Plan: anticipate LTAC/trach  Status is: Inpatient  Remains inpatient appropriate because:IV treatments appropriate due to intensity of illness or inability to take PO and Inpatient level of care appropriate due to severity of illness  Dispo: The patient is from: Home  Anticipated d/c is to:  Likely SNF  Anticipated d/c date is: >3 days  Patient currently is not medically stable to d/c.  Patient has poor mentation, not eating or drinking, being worked up for altered mentation  Consultants:   PCCM  Procedures:   See below  Antimicrobials:  Anti-infectives (From admission, onward)   Start     Dose/Rate Route Frequency Ordered Stop   09/30/20 0500  vancomycin (VANCOCIN) IVPB 1000 mg/200 mL premix  Status:  Discontinued        1,000 mg 200 mL/hr over 60 Minutes Intravenous Every 12 hours 09/29/20 1459 09/30/20 0654   09/30/20 0300  cefTRIAXone (ROCEPHIN) 1 g in sodium chloride 0.9 % 100 mL IVPB  Status:  Discontinued        1 g 200 mL/hr over 30 Minutes Intravenous Every 24 hours 09/29/20 0638 09/29/20 1341   09/29/20 2300  piperacillin-tazobactam (ZOSYN) IVPB 3.375 g  Status:  Discontinued        3.375 g 12.5 mL/hr over 240 Minutes Intravenous Every 8 hours 09/29/20 1446 10/01/20 0832   09/29/20 1500  vancomycin (VANCOREADY) IVPB 2000 mg/400 mL        2,000 mg 200 mL/hr over 120 Minutes Intravenous  Once 09/29/20 1406 09/29/20 1746   09/29/20 1500  ceFEPIme (MAXIPIME) 2 g in sodium chloride 0.9 % 100 mL IVPB  Status:   Discontinued        2 g 200 mL/hr over 30 Minutes Intravenous Every 8 hours 09/29/20 1406 09/29/20 1446   09/29/20 0315  cefTRIAXone (ROCEPHIN) 1 g in sodium chloride 0.9 % 100 mL IVPB        1 g 200 mL/hr over 30 Minutes Intravenous  Once 09/29/20 0309 09/29/20 0441   09/29/20 0315  azithromycin (ZITHROMAX) 500 mg in sodium chloride 0.9 % 250 mL IVPB  Status:  Discontinued        500 mg 250 mL/hr over 60 Minutes Intravenous Every 24 hours 09/29/20 0309 09/29/20 1341      Subjective: Patient remains obtunded and critically ill, struggling on bipap, high risk for re-intubation.   Objective: Vitals:   10/04/20 1430 10/04/20 1445 10/04/20 1509 10/04/20 1600  BP: (!) 152/83  108/78 137/63  Pulse: (!) 102 95 (!) 103 71  Resp: (!) 21 (!) 30 (!) 36 (!) 0  Temp: 98.2 F (36.8 C) 98.4 F (36.9 C)  98.8 F (37.1 C)  TempSrc:      SpO2: 95% 99% 94% 95%  Weight:      Height:        Intake/Output Summary (Last 24 hours) at 10/04/2020 1637 Last data filed at 10/04/2020 1200 Gross per 24 hour  Intake 1078.68 ml  Output 375 ml  Net 703.68 ml   Filed Weights   10/01/20 0500 10/03/20 0400 10/04/20 0400  Weight: 88.1 kg 85.5 kg 85 kg   Examination:  General exam: Pt appears  critically ill and remains obtunded, no significant improvements in mentation Respiratory system: rales heard bilaterally poor air movement. Moderated increased work of breathing. Cardiovascular system: tachycardic, normal S1 & S2 heard, no M/R/G.  Gastrointestinal system: Abdomen is soft, normal BS heard, no guarding.  Central nervous system: no gross focal deficits.  Extremities: No edema Skin: No gross lesions seen. No rashes, lesions or ulcers Psychiatry: unable to assess as he is not responsive  Data Reviewed: I have personally reviewed following labs and imaging studies  CBC: Recent Labs  Lab 09/29/20 0220 09/29/20 0732 09/30/20 0453 10/01/20 0427 10/02/20 0554 10/03/20 0534 10/04/20 0400  WBC  7.4   < > 6.6 6.6 6.3 9.5 7.2  NEUTROABS 5.9  --   --   --   --   --   --   HGB 13.3   < > 9.8* 9.6* 9.7* 10.3* 9.9*  HCT 41.1   < > 30.4* 31.0* 31.3* 34.2* 33.2*  MCV 87.6   < > 87.6 88.6 89.2 93.2 92.7  PLT 326   < > 263 276 297 325 280   < > = values in this interval not displayed.   Basic Metabolic Panel: Recent Labs  Lab 09/29/20 0732 09/29/20 1221 09/30/20 0453 10/01/20 0427 10/02/20 0554 10/03/20 0534 10/04/20 0400  NA  --    < > 133* 132* 137 142 146*  K  --    < > 3.4* 3.6 3.7 4.2 3.9  CL  --    < > 90* 92* 98 100 97*  CO2  --    < > 35* 30 32 35* 39*  GLUCOSE  --    < > 115* 107* 105* 84 95  BUN  --    < > 14 19 22* 21* 25*  CREATININE  --    < > 0.62 0.52* 0.50* 0.54* 0.49*  CALCIUM  --    < > 8.3* 8.4* 8.3* 8.4* 8.9  MG 1.8  --   --  2.0 2.2 2.2 2.1  PHOS <1.0*  --   --  2.4* 3.0  --   --    < > = values in this interval not displayed.   GFR: Estimated Creatinine Clearance: 101.4 mL/min (A) (by C-G formula based on SCr of 0.49 mg/dL (L)). Liver Function Tests: Recent Labs  Lab 09/29/20 0220 09/30/20 0453 10/03/20 0534 10/04/20 0400  AST 44* 24 19 29   ALT 24 16 17 23   ALKPHOS 81 50 48 54  BILITOT 0.6 0.5 0.3 0.6  PROT 7.6 5.5* 5.6* 5.6*  ALBUMIN 3.6 2.5* 2.5* 2.5*   No results for input(s): LIPASE, AMYLASE in the last 168 hours. No results for input(s): AMMONIA in the last 168 hours. Coagulation Profile: Recent Labs  Lab 09/30/20 0453  INR 1.0   Cardiac Enzymes: No results for input(s): CKTOTAL, CKMB, CKMBINDEX, TROPONINI in the last 168 hours. BNP (last 3 results) No results for input(s): PROBNP in the last 8760 hours. HbA1C: No results for input(s): HGBA1C in the last 72 hours. CBG: Recent Labs  Lab 10/03/20 2350 10/04/20 0409 10/04/20 0754 10/04/20 1218 10/04/20 1614  GLUCAP 91 87 96 103* 98   Lipid Profile: Recent Labs    10/02/20 0554  TRIG 101   Thyroid Function Tests: No results for input(s): TSH, T4TOTAL, FREET4, T3FREE,  THYROIDAB in the last 72 hours. Anemia Panel: No results for input(s): VITAMINB12, FOLATE, FERRITIN, TIBC, IRON, RETICCTPCT in the last 72 hours. Sepsis Labs: Recent Labs  Lab 09/29/20 0732 09/29/20 0735 09/29/20 1221 09/30/20 0459 10/01/20 0427 10/02/20 0554  PROCALCITON  --  0.13  --   --  <0.10 <0.10  LATICACIDVEN 2.5*  --  2.9* 1.5  --  1.0    Recent Results (from the past 240 hour(s))  Respiratory Panel by RT PCR (Flu A&B, Covid) - Nasopharyngeal Swab     Status: None   Collection Time: 09/29/20  2:20 AM   Specimen: Nasopharyngeal Swab  Result Value Ref Range Status   SARS Coronavirus 2 by RT PCR NEGATIVE NEGATIVE Final    Comment: (NOTE) SARS-CoV-2 target nucleic acids are NOT DETECTED.  The SARS-CoV-2 RNA is generally detectable in upper respiratoy specimens during the acute phase of infection. The lowest concentration of SARS-CoV-2 viral copies this assay can detect is 131 copies/mL. A negative result does not preclude SARS-Cov-2 infection and should not be used as the sole basis for treatment or other patient management decisions. A negative result may occur with  improper specimen collection/handling, submission of specimen other than nasopharyngeal swab, presence of viral mutation(s) within the areas targeted by this assay, and inadequate number of viral copies (<131 copies/mL). A negative result must be combined with clinical observations, patient history, and epidemiological information. The expected result is Negative.  Fact Sheet for Patients:  PinkCheek.be  Fact Sheet for Healthcare Providers:  GravelBags.it  This test is no t yet approved or cleared by the [place] FDA and  has been authorized for detection and/or diagnosis of SARS-CoV-2 by FDA under an Emergency Use Authorization (EUA). This EUA will remain  in effect (meaning this test can be used) for the duration of the COVID-19  declaration under Section 564(b)(1) of the Act, 21 U.S.C. section 360bbb-3(b)(1), unless the authorization is terminated or revoked sooner.     Influenza A by PCR NEGATIVE NEGATIVE Final   Influenza B by PCR NEGATIVE NEGATIVE Final    Comment: (NOTE) The Xpert Xpress SARS-CoV-2/FLU/RSV assay is intended as an aid in  the diagnosis of influenza from Nasopharyngeal swab specimens and  should not be used as a sole basis for treatment. Nasal washings and  aspirates are unacceptable for Xpert Xpress SARS-CoV-2/FLU/RSV  testing.  Fact Sheet for Patients: PinkCheek.be  Fact Sheet for Healthcare Providers: GravelBags.it  This test is not yet approved or cleared by the [place] FDA and  has been authorized for detection and/or diagnosis of SARS-CoV-2 by  FDA under an Emergency Use Authorization (EUA). This EUA will remain  in effect (meaning this test can be used) for the duration of the  Covid-19 declaration under Section 564(b)(1) of the Act, 21  U.S.C. section 360bbb-3(b)(1), unless the authorization is  terminated or revoked. Performed at [facility], [address], [place], [place] [address]   Blood culture (routine x 2)     Status: None (Preliminary result)   Collection Time: 09/29/20  3:25 AM   Specimen: BLOOD LEFT HAND  Result Value Ref Range Status   Specimen Description BLOOD LEFT HAND  Final   Special Requests   Final    BOTTLES DRAWN AEROBIC AND ANAEROBIC Blood Culture adequate volume   Culture   Final    NO GROWTH 4 DAYS Performed at [facility], [address], [place], [place] [address]    Report Status PENDING  Incomplete  Blood culture (routine x 2)     Status: None (Preliminary result)   Collection Time: 09/29/20  3:26 AM   Specimen: BLOOD RIGHT HAND  Result  Value Ref Range Status   Specimen Description BLOOD RIGHT HAND  Final   Special Requests   Final    BOTTLES DRAWN AEROBIC AND ANAEROBIC Blood  Culture adequate volume   Culture   Final    NO GROWTH 4 DAYS Performed at [facility], [address] [place], [place] [address]    Report Status PENDING  Incomplete  MRSA PCR Screening     Status: None   Collection Time: 09/29/20  1:42 PM   Specimen: Nasal Mucosa; Nasopharyngeal  Result Value Ref Range Status   MRSA by PCR NEGATIVE NEGATIVE Final    Comment:        The GeneXpert MRSA Assay (FDA approved for NASAL specimens only), is one component of a comprehensive MRSA colonization surveillance program. It is not intended to diagnose MRSA infection nor to guide or monitor treatment for MRSA infections. Performed at [facility], [address], [place], [place] [address]     Radiology Studies: MR BRAIN WO CONTRAST  Result Date: 10/03/2020 CLINICAL DATA:  Mental status change, unknown cause EXAM: MRI HEAD WITHOUT CONTRAST TECHNIQUE: Multiplanar, multiecho pulse sequences of the brain and surrounding structures were obtained without intravenous contrast. COMPARISON:  09/29/2020 head CT and prior.  12/22/2018 MRI head. FINDINGS: Please note that image quality is degraded by motion artifact. Examination was terminated early secondary to patient disposition. Limited sequence acquisition limits evaluation. Brain: No diffusion-weighted signal abnormality. No intracranial hemorrhage. No midline shift, ventriculomegaly or extra-axial fluid collection. No mass lesion. Mild diffuse cerebral atrophy with ex vacuo dilatation. Vascular: Normal flow voids. Skull and upper cervical spine: Normal marrow signal. Chronic medial left orbital wall posttraumatic deformity. Sinuses/Orbits: Normal orbits. Clear paranasal sinuses. Bilateral mastoid effusions. Other: None. IMPRESSION: No acute intracranial process.  Mild cerebral atrophy. Limited sequence acquisition and motion artifact limits evaluation. Electronically Signed   By: Primitivo Gauze M.D.   On: 10/03/2020 10:19   DG Chest Port 1  View  Result Date: 10/04/2020 CLINICAL DATA:  Respiratory failure EXAM: PORTABLE CHEST 1 VIEW COMPARISON:  Three days ago FINDINGS: Interval extubation but worsening diffuse interstitial and airspace opacity. Small pleural effusions. No pneumothorax. No acute osseous finding. Postoperative distal right clavicle. IMPRESSION: Worsening pulmonary opacification with symmetry favoring edema over infection or aspiration. Electronically Signed   By: Monte Fantasia M.D.   On: 10/04/2020 07:02   EEG adult  Result Date: 10/04/2020 Lora Havens, MD     10/04/2020  9:19 AM Patient Name: Jeffrey [name] MRN: [id number] Epilepsy Attending: Lora Havens Referring Physician/Provider: Dr. Irwin Brakeman Date: 10/03/2020 Duration: 26.18 minutes Patient history: 59 year old male with altered mental status.  EEG to evaluate for seizures. Level of alertness: Awake AEDs during EEG study: Gabapentin, Depakote Technical aspects: This EEG study was done with scalp electrodes positioned according to the 10-20 International system of electrode placement. Electrical activity was acquired at a sampling rate of 500Hz  and reviewed with a high frequency filter of 70Hz  and a low frequency filter of 1Hz . EEG data were recorded continuously and digitally stored. Description: No posterior dominant rhythm was seen. EEG showed continuous generalized 3 to 6 Hz theta-delta slowing. Hyperventilation and photic stimulation were not performed.   ABNORMALITY -Continuous slow, generalized IMPRESSION: This study is suggestive of moderate diffuse encephalopathy, nonspecific etiology. No seizures or epileptiform discharges were seen throughout the recording. Lora Havens   ECHOCARDIOGRAM COMPLETE  Result Date: 10/04/2020    ECHOCARDIOGRAM REPORT   Patient Name:   SIDHANT HELDERMAN [facility]  Date of Exam: 10/04/2020 Medical Rec #:  [id number]       Height:       70.0 in Accession #:    [id number]      Weight:       187.4 lb Date of Birth:  22-Jul-1961        BSA:          2.030 m Patient Age:    52 years        BP:           108/78 mmHg Patient Gender: M               HR:           103 bpm. Exam Location:  [facility] Procedure: 2D Echo Indications:    Congestive Heart Failure 428.0 / I50.9  History:        Patient has prior history of Echocardiogram examinations, most                 recent 10/12/2019. COPD, Signs/Symptoms:Fever; Risk                 Factors:Dyslipidemia, Hypertension and Former Smoker. Bipolar,                 GERD.  Sonographer:    Leavy Cella RDCS (AE) Referring Phys: [id number] VINEET SOOD IMPRESSIONS  1. Left ventricular ejection fraction, by estimation, is 55 to 60%. The left ventricle has normal function. The left ventricle has no regional wall motion abnormalities. Left ventricular diastolic parameters are indeterminate.  2. Right ventricular systolic function is normal. The right ventricular size is normal.  3. Left atrial size was severely dilated.  4. Moderate to severe gradient across the MV (mean grade 10). 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 Consider TEE to better evaluate anatomy and severity of stenosis. . The mitral valve is normal in structure. Mild mitral valve regurgitation. No evidence of mitral stenosis.  5. The aortic valve is tricuspid. Aortic valve regurgitation is not visualized. No aortic stenosis is present.  6. The inferior vena cava is normal in size with greater than 50% respiratory variability, suggesting right atrial pressure of 3 mmHg. FINDINGS  Left Ventricle: Left ventricular ejection fraction, by estimation, is 55 to 60%. The left ventricle has normal function. The left ventricle has no regional wall motion abnormalities. The left ventricular internal cavity size was normal in size. There is  no left ventricular hypertrophy. Left ventricular  diastolic parameters are indeterminate. Right Ventricle: The right ventricular size is normal. No increase in right ventricular wall thickness. Right ventricular systolic function is normal. Left Atrium: Left atrial size was severely dilated. Right Atrium: Right atrial size was normal in size. Pericardium: There is no evidence of pericardial effusion. Mitral Valve: Moderate to severe gradient across the MV (mean grade 10). 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 Consider TEE to better evaluate anatomy and severity of stenosis. The mitral valve is normal in structure. Mild mitral valve regurgitation. No evidence of mitral valve stenosis. The mean mitral valve gradient is 10.5 mmHg with  average heart rate of 85 bpm. Tricuspid Valve: The tricuspid valve is normal in structure.  Tricuspid valve regurgitation is not demonstrated. No evidence of tricuspid stenosis. Aortic Valve: The aortic valve is tricuspid. Aortic valve regurgitation is not visualized. No aortic stenosis is present. Aortic valve mean gradient measures 3.3 mmHg. Aortic valve peak gradient measures 6.2 mmHg. Pulmonic Valve: The pulmonic valve was not well visualized. Pulmonic valve regurgitation is not visualized. No evidence of pulmonic stenosis. Aorta: The aortic root is normal in size and structure. Venous: The inferior vena cava is normal in size with greater than 50% respiratory variability, suggesting right atrial pressure of 3 mmHg. IAS/Shunts: No atrial level shunt detected by color flow Doppler.  LEFT VENTRICLE PLAX 2D LVIDd:         4.57 cm Diastology LVIDs:         3.97 cm LV e' medial:    7.62 cm/s LV PW:         1.00 cm LV E/e' medial:  28.0 LV IVS:        1.05 cm LV e' lateral:   9.46 cm/s                        LV E/e' lateral: 22.5  RIGHT VENTRICLE RV S prime:     19.30  cm/s TAPSE (M-mode): 2.6 cm LEFT ATRIUM             Index       RIGHT ATRIUM           Index LA diam:        4.40 cm 2.17 cm/m  RA Area:     13.30 cm LA Vol (A2C):   60.3 ml 29.70 ml/m RA Volume:   31.60 ml  15.56 ml/m LA Vol (A4C):   80.8 ml 39.80 ml/m LA Biplane Vol: 75.3 ml 37.09 ml/m  AORTIC VALVE AV Vmax:           124.11 cm/s AV Vmean:          84.984 cm/s AV VTI:            0.272 m AV Peak Grad:      6.2 mmHg AV Mean Grad:      3.3 mmHg LVOT Vmax:         124.45 cm/s LVOT Vmean:        74.395 cm/s LVOT VTI:          0.254 m LVOT/AV VTI ratio: 0.93  AORTA Ao Root diam: 2.70 cm MITRAL VALVE MV Area (PHT): 2.93 cm     SHUNTS MV Mean grad:  10.5 mmHg    Systemic VTI: 0.25 m MV Decel Time: 259 msec MR Peak grad: 108.6 mmHg MR Mean grad: 77.0 mmHg MR Vmax:      521.00 cm/s MR Vmean:     418.0 cm/s MV E velocity: 213.00 cm/s MV A velocity: 207.00 cm/s MV E/A ratio:  1.03 Carlyle Dolly MD Electronically signed by Carlyle Dolly MD Signature Date/Time: 10/04/2020/4:30:38 PM    Final    [place] EKG SITE RITE  Result Date: 10/03/2020 If Site Rite image not attached, placement could not be confirmed due to current cardiac rhythm.  Scheduled Meds: . chlorhexidine  15 mL Mouth Rinse BID  . chlorhexidine gluconate (MEDLINE KIT)  15 mL Mouth Rinse BID  . Chlorhexidine Gluconate Cloth  6 each Topical Q0600  . enoxaparin (LOVENOX) injection  40 mg Subcutaneous Q24H  . furosemide  40 mg Intravenous Q6H  . ipratropium-albuterol  3 mL Nebulization Q6H  . mouth  rinse  15 mL Mouth Rinse q12n4p  . methylPREDNISolone (SOLU-MEDROL) injection  20 mg Intravenous Q12H  . metoprolol tartrate  2.5 mg Intravenous Q8H  . pantoprazole (PROTONIX) IV  40 mg Intravenous Q24H  . sodium chloride flush  10-40 mL Intracatheter Q12H   Continuous Infusions: . dexmedetomidine (PRECEDEX) IV infusion 0.4 mcg/kg/hr (10/04/20 1427)  . dextrose 50 mL/hr at 10/04/20 1458  . valproate sodium 500 mg (10/04/20 1216)     LOS: 5  days   Critical Care Procedure Note Authorized and Performed by: Murvin Natal MD  Total Critical Care time:  36 mins Due to a high probability of clinically significant, life threatening deterioration, the patient required my highest level of preparedness to intervene emergently and I personally spent this critical care time directly and personally managing the patient.  This critical care time included obtaining a history; examining the patient, pulse oximetry; ordering and review of studies; arranging urgent treatment with development of a management plan; evaluation of patient's response of treatment; frequent reassessment; and discussions with other providers.  This critical care time was performed to assess and manage the high probability of imminent and life threatening deterioration that could result in multi-organ failure.  It was exclusive of separately billable procedures and treating other patients and teaching time.   Irwin Brakeman, MD How to contact the [facility] Attending or Consulting provider [place] or covering provider during after hours [place], for this patient?  1. Check the care team in [facility] and look for a) attending/consulting TRH provider listed and b) the [facility] team listed 2. Log into [email or web address] and use [place]'s universal password to access. If you do not have the password, please contact the hospital operator. 3. Locate the [employer] provider you are looking for under [facility] and page to a number that you can be directly reached. 4. If you still have difficulty reaching the provider, please page the [facility] (Director on Call) for the Hospitalists listed on amion for assistance. ' [facility]  If 7PM-7AM, please contact night-coverage [email or web address] 10/04/2020, 4:37 PM

## 2020-10-04 NOTE — Progress Notes (Signed)
Peripherally Inserted Central Catheter Placement  The IV Nurse has discussed with the patient and/or persons authorized to consent for the patient, the purpose of this procedure and the potential benefits and risks involved with this procedure.  The benefits include less needle sticks, lab draws from the catheter, and the patient may be discharged home with the catheter. Risks include, but not limited to, infection, bleeding, blood clot (thrombus formation), and puncture of an artery; nerve damage and irregular heartbeat and possibility to perform a PICC exchange if needed/ordered by physician.  Alternatives to this procedure were also discussed.  Bard Power PICC patient education guide, fact sheet on infection prevention and patient information card has been provided to patient /or left at bedside.    PICC Placement Documentation  PICC Double Lumen 10/04/20 PICC Right Basilic 39 cm 0 cm (Active)  Indication for Insertion or Continuance of Line Poor Vasculature-patient has had multiple peripheral attempts or PIVs lasting less than 24 hours 10/04/20 0900  Exposed Catheter (cm) 0 cm 10/04/20 0900  Site Assessment Clean;Dry;Intact 10/04/20 0900  Lumen #1 Status Flushed;Blood return noted 10/04/20 0900  Lumen #2 Status Flushed;Blood return noted 10/04/20 0900  Dressing Type Transparent 10/04/20 0900  Dressing Status Clean;Dry;Intact 10/04/20 0900  Antimicrobial disc in place? Yes 10/04/20 0900  Dressing Change Due 10/11/20 10/04/20 0900    Telephone consent  Stacie Glaze Horton 10/04/2020, 9:33 AM

## 2020-10-04 NOTE — Progress Notes (Addendum)
NAME:  Jeffrey [name], MRN:  [id number], DOB:  [birth year], LOS: 5 ADMISSION DATE:  09/29/2020, CONSULTATION DATE: 09/30/20 REFERRING MD:  Dr. Maurilio Lovely, [facility], CHIEF COMPLAINT:  Respiratory failure   Brief History   59 yo male former smoker with hx of COPD on 3 to 4 liters oxygen at home brought to ER in respiratory distress with altered mental status.  SpO2 78% on room air.  He required intubation in the ER.  He is followed in pulmonary office by Dr. Sherene Sires.    Past Medical History  COPD, Bipolar, Diastolic CHF, Hepatic encephalopathy, Depression, Polysubstance abuse, [name], ETOH, GI bleed, HTN, GERD  Significant Hospital Events   11/07 Admit 11/08 extubated, needed Bipap after 11/09 altered mental status with ?seizure versus withdrawal 11/10 started on precedex; Palliative care consulted >> family wishes to continue aggressive therapies 11/11 arrange for transfer to [place]  Consults:  Palliative care Neurology  Procedures:  ETT 11/07 >> 11/10  Significant Diagnostic Tests:   PFT 10/09/16 >> 1.84 (49%), FEV1% 48, TLC 7.39 (105%), RV 3.59 (166%), DLCO 36%  A1AT 10/09/16 >> 215, MM  Echo 10/12/19 >> EF 65 to 70%, grade 1 DD, mild/mod LA dilation, mild MR, mild/mod AR  MRI brain 10/03/20 >> mild atrophy  EEG 10/03/20 >> generalized slowing  Micro Data:  COVID/Flu 09/29/20 >> negative MRSA PCR 09/29/20 >> negative Blood 09/29/20 >>   Antimicrobials:  Zithromax 11/06  Rocephin 11/06  Vancomycin 11/07  Zosyn 11/07 >> 11/09  Interim history/subjective:  Remains on Bipap, precedex.  Objective   Blood pressure (!) 155/76, pulse 81, temperature 98.8 F (37.1 C), resp. rate (!) 24, height 5\' 10"  (1.778 m), weight 85 kg, SpO2 97 %.    FiO2 (%):  [40 %] 40 %   Intake/Output Summary (Last 24 hours) at 10/04/2020 1402 Last data filed at 10/04/2020 1200 Gross per 24 hour  Intake 1078.68 ml  Output 375 ml  Net 703.68 ml   Filed Weights   10/01/20 0500 10/03/20 0400  10/04/20 0400  Weight: 88.1 kg 85.5 kg 85 kg    Examination:  General - sedated Eyes - pupils reactive ENT - Bipap mask on Cardiac - regular rate/rhythm, no murmur Chest - b/l rhonchi, decreased breath sounds Abdomen - soft, non tender, + bowel sounds Extremities - 1+ edema Skin - no rashes Neuro - RASS -2  Resolved Hospital Problem list   Lactic acidosis from hypoxia  Assessment & Plan:   Acute on chronic hypoxic/hypercapnic respiratory failure from COPD exacerbation. - continue Bipap - monitor need for re-intubation - change solumedrol to 20 mg bid - scheduled BDs - goal SpO2 90 to 95% - family would be agreeable to tracheostomy if needed  Acute pulmonary edema. - lasix 40 mg IV q6h x 2 on 10/04/20 - f/u CXR  Acute metabolic encephalopathy from hypoxia. Hx of polysubstance abuse, ETOH, depression, bipolar. - concern that events of 11/11 likely from worsening hypercapnia and alcohol withdrawal - continue precedex for RASS goal -1 - continue prn ativan, fentanyl - continue valproate IV - hold outpt prozac, neurontin, zyprexa, trazodone until he is able to take pills  Anemia of critical illness and chronic disease. - f/u CBC - transfuse for Hb < 7 or significant bleeding  Hx of HTN, [name], Chronic diastolic CHF. - continue IV lopressor - hold outpt ASA, lipitor until able to take pills - hold outpt cardizem  Goals of care. - concern that he will continue to have decline in his  respiratory status and that he eventually will become ventilator dependent - palliative care consulted >> family wishes to continue aggressive therapy  Best practice:  Diet: NPO DVT prophylaxis: Lovenox GI prophylaxis: Protonix Mobility: bed rest Code Status: full code Disposition: ICU; hospitalist team to arrange for transfer to [facility]; will then place on PCCM service while in ICU in [facility]    CMP Latest Ref Rng & Units 10/04/2020 10/03/2020 10/02/2020  Glucose  70 - 99 mg/dL 95 84 545(G)  BUN 6 - 20 mg/dL 25(W) 38(L) 37(D)  Creatinine 0.61 - 1.24 mg/dL 4.28(J) 6.81(L) 5.72(I)  Sodium 135 - 145 mmol/L 146(H) 142 137  Potassium 3.5 - 5.1 mmol/L 3.9 4.2 3.7  Chloride 98 - 111 mmol/L 97(L) 100 98  CO2 22 - 32 mmol/L 39(H) 35(H) 32  Calcium 8.9 - 10.3 mg/dL 8.9 2.0(B) 8.3(L)  Total Protein 6.5 - 8.1 g/dL 5.5(H) 5.6(L) -  Total Bilirubin 0.3 - 1.2 mg/dL 0.6 0.3 -  Alkaline Phos 38 - 126 U/L 54 48 -  AST 15 - 41 U/L 29 19 -  ALT 0 - 44 U/L 23 17 -    CBC Latest Ref Rng & Units 10/04/2020 10/03/2020 10/02/2020  WBC 4.0 - 10.5 K/uL 7.2 9.5 6.3  Hemoglobin 13.0 - 17.0 g/dL 7.4(B) 10.3(L) 9.7(L)  Hematocrit 39 - 52 % 33.2(L) 34.2(L) 31.3(L)  Platelets 150 - 400 K/uL 280 325 297    ABG    Component Value Date/Time   PHART 7.387 10/04/2020 0844   PCO2ART 68.3 (HH) 10/04/2020 0844   PO2ART 90.2 10/04/2020 0844   HCO3 36.8 (H) 10/04/2020 0844   TCO2 34 (H) 12/21/2018 0357   ACIDBASEDEF 0.6 02/17/2019 1850   O2SAT 96.9 10/04/2020 0844    CBG (last 3)  Recent Labs    10/04/20 0409 10/04/20 0754 10/04/20 1218  GLUCAP 87 96 103*     Critical care time: 36 minutes  Coralyn Helling, MD [place] [facility]monary/Critical Care Pager - [phone] - [phone] 10/04/2020, 2:02 PM   I spoke with pt's daughter, [name], by phone.  Updated about current status and plan to transfer to [facility].  Explained concern that if he doesn't show improvement soon that he will then need reintubation and then might need tracheostomy and long term vent support.  She feels that her father has gone through this several times before and has always bounced back, but it takes him a long time to bounce back.  Therefore, she wants to continue aggressive therapy.  Coralyn Helling, MD [facility] Pulmonary/Critical Care Pager - [phone] 10/04/2020, 2:23 PM

## 2020-10-04 NOTE — Progress Notes (Signed)
*  PRELIMINARY RESULTS* Echocardiogram 2D Echocardiogram has been performed.  Jeffrey [name] 10/04/2020, 3:43 PM

## 2020-10-04 NOTE — Procedures (Signed)
Patient Name: MYLZ YUAN  MRN: [id number]  Epilepsy Attending: Charlsie Quest  Referring Physician/Provider: Dr. Standley Dakins Date: 10/03/2020 Duration: 26.18 minutes  Patient history: 59 year old male with altered mental status.  EEG to evaluate for seizures.  Level of alertness: Awake  AEDs during EEG study: Gabapentin, Depakote  Technical aspects: This EEG study was done with scalp electrodes positioned according to the 10-20 International system of electrode placement. Electrical activity was acquired at a sampling rate of 500Hz  and reviewed with a high frequency filter of 70Hz  and a low frequency filter of 1Hz . EEG data were recorded continuously and digitally stored.   Description: No posterior dominant rhythm was seen. EEG showed continuous generalized 3 to 6 Hz theta-delta slowing. Hyperventilation and photic stimulation were not performed.     ABNORMALITY -Continuous slow, generalized  IMPRESSION: This study is suggestive of moderate diffuse encephalopathy, nonspecific etiology. No seizures or epileptiform discharges were seen throughout the recording.  Rashell Shambaugh 

## 2020-10-05 ENCOUNTER — Inpatient Hospital Stay (HOSPITAL_COMMUNITY): Payer: Medicare Other

## 2020-10-05 DIAGNOSIS — L8990 Pressure ulcer of unspecified site, unspecified stage: Secondary | ICD-10-CM | POA: Insufficient documentation

## 2020-10-05 DIAGNOSIS — J9621 Acute and chronic respiratory failure with hypoxia: Secondary | ICD-10-CM | POA: Diagnosis not present

## 2020-10-05 DIAGNOSIS — J441 Chronic obstructive pulmonary disease with (acute) exacerbation: Secondary | ICD-10-CM | POA: Diagnosis not present

## 2020-10-05 DIAGNOSIS — J9622 Acute and chronic respiratory failure with hypercapnia: Secondary | ICD-10-CM | POA: Diagnosis not present

## 2020-10-05 LAB — BASIC METABOLIC PANEL
Anion gap: 10 (ref 5–15)
BUN: 23 mg/dL — ABNORMAL HIGH (ref 6–20)
CO2: 42 mmol/L — ABNORMAL HIGH (ref 22–32)
Calcium: 8.6 mg/dL — ABNORMAL LOW (ref 8.9–10.3)
Chloride: 90 mmol/L — ABNORMAL LOW (ref 98–111)
Creatinine, Ser: 0.51 mg/dL — ABNORMAL LOW (ref 0.61–1.24)
GFR, Estimated: >60 mL/min (ref >60)
Glucose, Bld: 98 mg/dL (ref 70–99)
Potassium: 3.3 mmol/L — ABNORMAL LOW (ref 3.5–5.1)
Sodium: 142 mmol/L (ref 135–145)

## 2020-10-05 LAB — CBC
HCT: 31.3 % — ABNORMAL LOW (ref 39.0–52.0)
Hemoglobin: 9.7 g/dL — ABNORMAL LOW (ref 13.0–17.0)
MCH: 27.3 pg (ref 26.0–34.0)
MCHC: 31 g/dL (ref 30.0–36.0)
MCV: 88.2 fL (ref 80.0–100.0)
Platelets: 310 10*3/uL (ref 150–400)
RBC: 3.55 MIL/uL — ABNORMAL LOW (ref 4.22–5.81)
RDW: 13.4 % (ref 11.5–15.5)
WBC: 7.6 10*3/uL (ref 4.0–10.5)
nRBC: 0 % (ref 0.0–0.2)

## 2020-10-05 LAB — GLUCOSE, CAPILLARY
Glucose-Capillary: 101 mg/dL — ABNORMAL HIGH (ref 70–99)
Glucose-Capillary: 107 mg/dL — ABNORMAL HIGH (ref 70–99)
Glucose-Capillary: 108 mg/dL — ABNORMAL HIGH (ref 70–99)
Glucose-Capillary: 108 mg/dL — ABNORMAL HIGH (ref 70–99)
Glucose-Capillary: 113 mg/dL — ABNORMAL HIGH (ref 70–99)
Glucose-Capillary: 90 mg/dL (ref 70–99)
Glucose-Capillary: 96 mg/dL (ref 70–99)

## 2020-10-05 LAB — CULTURE, BLOOD (ROUTINE X 2)
Culture: NO GROWTH
Culture: NO GROWTH
Special Requests: ADEQUATE
Special Requests: ADEQUATE

## 2020-10-05 LAB — MAGNESIUM: Magnesium: 2 mg/dL (ref 1.7–2.4)

## 2020-10-05 MED ORDER — POTASSIUM CHLORIDE 20 MEQ/15ML (10%) PO SOLN
40.0000 meq | Freq: Two times a day (BID) | ORAL | Status: DC
  Filled 2020-10-05 (×2): qty 30

## 2020-10-05 MED ORDER — METHYLPREDNISOLONE SODIUM SUCC 40 MG IJ SOLR
40.0000 mg | Freq: Every day | INTRAMUSCULAR | Status: DC
  Administered 2020-10-06 – 2020-10-07 (×2): 40 mg via INTRAVENOUS
  Filled 2020-10-05 (×2): qty 1

## 2020-10-05 MED ORDER — REVEFENACIN 175 MCG/3ML IN SOLN
175.0000 ug | Freq: Every day | RESPIRATORY_TRACT | Status: DC
  Administered 2020-10-05 – 2020-10-09 (×5): 175 ug via RESPIRATORY_TRACT
  Filled 2020-10-05 (×6): qty 3

## 2020-10-05 MED ORDER — BUDESONIDE 0.5 MG/2ML IN SUSP
0.5000 mg | Freq: Two times a day (BID) | RESPIRATORY_TRACT | Status: DC
  Administered 2020-10-05 – 2020-10-09 (×8): 0.5 mg via RESPIRATORY_TRACT
  Filled 2020-10-05 (×8): qty 2

## 2020-10-05 MED ORDER — ALBUTEROL SULFATE (2.5 MG/3ML) 0.083% IN NEBU: 2.5000 mg | INHALATION_SOLUTION | RESPIRATORY_TRACT | Status: DC | PRN

## 2020-10-05 MED ORDER — ARFORMOTEROL TARTRATE 15 MCG/2ML IN NEBU
15.0000 ug | INHALATION_SOLUTION | Freq: Two times a day (BID) | RESPIRATORY_TRACT | Status: DC
  Administered 2020-10-05 – 2020-10-09 (×8): 15 ug via RESPIRATORY_TRACT
  Filled 2020-10-05 (×7): qty 2

## 2020-10-05 MED ORDER — POTASSIUM CHLORIDE 10 MEQ/50ML IV SOLN
10.0000 meq | INTRAVENOUS | Status: AC
  Administered 2020-10-05 (×6): 10 meq via INTRAVENOUS
  Filled 2020-10-05 (×6): qty 50

## 2020-10-05 NOTE — H&P (Signed)
NAME:  Jeffrey [name], MRN:  [id number], DOB:  [birth year], LOS: 6 ADMISSION DATE:  09/29/2020, CONSULTATION DATE: 09/30/20 REFERRING MD:  Dr. Maurilio Lovely, [facility], CHIEF COMPLAINT:  Respiratory failure   Brief History   59 yo male former smoker with hx of COPD on 3 to 4 liters oxygen at home brought to ER in respiratory distress with altered mental status.  SpO2 78% on room air.  He required intubation in the ER.  He is followed in pulmonary office by Dr. Sherene Sires.    Past Medical History  COPD, Bipolar, Diastolic CHF, Hepatic encephalopathy, Depression, Polysubstance abuse, [name], ETOH, GI bleed, HTN, GERD  Significant Hospital Events   11/07 Admit 11/08 extubated, needed Bipap after 11/09 altered mental status with ?seizure versus withdrawal 11/10 started on precedex; Palliative care consulted >> family wishes to continue aggressive therapies 11/11 arrange for transfer to [place] 11/13 admitted to [facility] MICU  Consults:  Palliative care Neurology  Procedures:  ETT 11/07 >> 11/10  Significant Diagnostic Tests:   PFT 10/09/16 >> 1.84 (49%), FEV1% 48, TLC 7.39 (105%), RV 3.59 (166%), DLCO 36%  A1AT 10/09/16 >> 215, MM  Echo 10/12/19 >> EF 65 to 70%, grade 1 DD, mild/mod LA dilation, mild MR, mild/mod AR  MRI brain 10/03/20 >> mild atrophy  EEG 10/03/20 >> generalized slowing  Micro Data:  COVID/Flu 09/29/20 >> negative MRSA PCR 09/29/20 >> negative Blood 09/29/20 >>   Antimicrobials:  Zithromax 11/06  Rocephin 11/06  Vancomycin 11/07  Zosyn 11/07 >> 11/09  Interim history/subjective:   Transferred from [facility] to ICU for evaluation. Possible need for re-intubation and if this occurs recommending tracheostomy.   Objective   Blood pressure (P) 133/73, pulse 85, temperature 99.1 F (37.3 C), resp. rate (!) 34, height 5\' 10"  (1.778 m), weight 85 kg, SpO2 94 %.    FiO2 (%):  [35 %-40 %] 35 %   Intake/Output Summary (Last 24 hours) at 10/05/2020 1304 Last data filed at 10/05/2020  1138 Gross per 24 hour  Intake 1866.35 ml  Output 4575 ml  Net -2708.65 ml   Filed Weights   10/01/20 0500 10/03/20 0400 10/04/20 0400  Weight: 88.1 kg 85.5 kg 85 kg    Examination:  General - alert, attempts to track and commnicate Eyes - tracking ENT - BIPAP off at this time  Cardiac - RRR, s1 s2  Chest - diminshed bilaterally  Abdomen - soft, nt nd  Extremities - no edema Skin - decreased muscle mass Neuro - alert to voice   Resolved Hospital Problem list   Lactic acidosis from hypoxia  Assessment & Plan:   Acute on chronic hypoxic/hypercapnic respiratory failure from COPD exacerbation. Plan: Continue BIPAP PRN  Monitor closely for reintubation need Solumedrol 40mg  daily  Scheduled nebs, pulmicort, brovanna, yupelri  Wean Fio2 maintain sats >88% procal neg, holding abx   Acute pulmonary edema. - follow UOP - lasix to maintain euvolemia as needed   Acute metabolic encephalopathy from hypoxia and multifactorial reasons below Hx of polysubstance abuse, ETOH, depression, bipolar. - continue precedex - other sedating meds held - continue valproic acid - main need to restart PRN bdz but would prefer to hold off having them available on MAR until certain if he has withdrawal issues   Hypokalemia - replete goal >4  Anemia of critical illness and chronic disease. - follow cbc and plan for a conservative transfusion threshold hgb<7   Hx of HTN, [name], Chronic diastolic CHF. - prn betablocker for HR  -  diuresis as needed  Goals of care. - if he gets re-intubated I suspect he will need tracheostomy placement  Best practice:  Diet: NPO DVT prophylaxis: Lovenox GI prophylaxis: Protonix Mobility: bed rest Code Status: full code Disposition: ICU   Labs    CMP Latest Ref Rng & Units 10/05/2020 10/04/2020 10/03/2020  Glucose 70 - 99 mg/dL 98 95 84  BUN 6 - 20 mg/dL 44(Y) 18(H) 63(J)  Creatinine 0.61 - 1.24 mg/dL 4.97(W) 2.63(Z) 8.58(I)  Sodium 135 - 145  mmol/L 142 146(H) 142  Potassium 3.5 - 5.1 mmol/L 3.3(L) 3.9 4.2  Chloride 98 - 111 mmol/L 90(L) 97(L) 100  CO2 22 - 32 mmol/L 42(H) 39(H) 35(H)  Calcium 8.9 - 10.3 mg/dL 5.0(Y) 8.9 7.7(A)  Total Protein 6.5 - 8.1 g/dL - 5.6(L) 5.6(L)  Total Bilirubin 0.3 - 1.2 mg/dL - 0.6 0.3  Alkaline Phos 38 - 126 U/L - 54 48  AST 15 - 41 U/L - 29 19  ALT 0 - 44 U/L - 23 17    CBC Latest Ref Rng & Units 10/05/2020 10/04/2020 10/03/2020  WBC 4.0 - 10.5 K/uL 7.6 7.2 9.5  Hemoglobin 13.0 - 17.0 g/dL 1.2(I) 7.8(M) 10.3(L)  Hematocrit 39 - 52 % 31.3(L) 33.2(L) 34.2(L)  Platelets 150 - 400 K/uL 310 280 325    ABG    Component Value Date/Time   PHART 7.387 10/04/2020 0844   PCO2ART 68.3 (HH) 10/04/2020 0844   PO2ART 90.2 10/04/2020 0844   HCO3 36.8 (H) 10/04/2020 0844   TCO2 34 (H) 12/21/2018 0357   ACIDBASEDEF 0.6 02/17/2019 1850   O2SAT 96.9 10/04/2020 0844    CBG (last 3)  Recent Labs    10/05/20 0805 10/05/20 1139 10/05/20 1252  GLUCAP 108* 113* 107*    This patient is critically ill with multiple organ system failure; which, requires frequent high complexity decision making, assessment, support, evaluation, and titration of therapies. This was completed through the application of advanced monitoring technologies and extensive interpretation of multiple databases. During this encounter critical care time was devoted to patient care services described in this note for 31 minutes.  Josephine Igo, DO [place] [facility] 10/05/2020 1:04 PM

## 2020-10-05 NOTE — Progress Notes (Signed)
Pt arrived on the unit, slightly drowsy, oriented to self. Can only whisper at this time. 3L [place], VS stable Denture upper, and clothes at the bedside. Bed in low position, alarms are on, call bell in reach, MD at the bedside, aware of K 3.3. RT notify to have Bipap at ready Continue to monitor

## 2020-10-05 NOTE — Progress Notes (Signed)
Patient asleep off BiPAP, resting with out problems On 2liters

## 2020-10-05 NOTE — Progress Notes (Signed)
Notified Daughter [name] of pt tx to MICU 04 in [place].

## 2020-10-05 NOTE — Progress Notes (Signed)
Multiple times, thorough mouth care was done. Extreme large amount of green/tan coating throughout. Pt tolerated well. More alert and cooperative

## 2020-10-06 DIAGNOSIS — J441 Chronic obstructive pulmonary disease with (acute) exacerbation: Secondary | ICD-10-CM | POA: Diagnosis not present

## 2020-10-06 LAB — CBC WITH DIFFERENTIAL/PLATELET
Abs Immature Granulocytes: 0.03 10*3/uL (ref 0.00–0.07)
Abs Immature Granulocytes: 0.02 10*3/uL (ref 0.00–0.07)
Basophils Absolute: 0 10*3/uL (ref 0.0–0.1)
Basophils Absolute: 0 10*3/uL (ref 0.0–0.1)
Basophils Relative: 0 %
Basophils Relative: 0 %
Eosinophils Absolute: 0.2 10*3/uL (ref 0.0–0.5)
Eosinophils Absolute: 0.2 10*3/uL (ref 0.0–0.5)
Eosinophils Relative: 2 %
Eosinophils Relative: 3 %
HCT: 27.7 % — ABNORMAL LOW (ref 39.0–52.0)
HCT: 28 % — ABNORMAL LOW (ref 39.0–52.0)
Hemoglobin: 8.9 g/dL — ABNORMAL LOW (ref 13.0–17.0)
Hemoglobin: 8.8 g/dL — ABNORMAL LOW (ref 13.0–17.0)
Immature Granulocytes: 0 %
Immature Granulocytes: 0 %
Lymphocytes Relative: 12 %
Lymphocytes Relative: 12 %
Lymphs Abs: 0.8 10*3/uL (ref 0.7–4.0)
Lymphs Abs: 0.7 10*3/uL (ref 0.7–4.0)
MCH: 28.3 pg (ref 26.0–34.0)
MCH: 27.6 pg (ref 26.0–34.0)
MCHC: 32.1 g/dL (ref 30.0–36.0)
MCHC: 31.4 g/dL (ref 30.0–36.0)
MCV: 88.2 fL (ref 80.0–100.0)
MCV: 87.8 fL (ref 80.0–100.0)
Monocytes Absolute: 0.6 10*3/uL (ref 0.1–1.0)
Monocytes Absolute: 0.6 10*3/uL (ref 0.1–1.0)
Monocytes Relative: 9 %
Monocytes Relative: 10 %
Neutro Abs: 5.3 10*3/uL (ref 1.7–7.7)
Neutro Abs: 4.9 10*3/uL (ref 1.7–7.7)
Neutrophils Relative %: 77 %
Neutrophils Relative %: 75 %
Platelets: 266 10*3/uL (ref 150–400)
Platelets: 271 10*3/uL (ref 150–400)
RBC: 3.14 MIL/uL — ABNORMAL LOW (ref 4.22–5.81)
RBC: 3.19 MIL/uL — ABNORMAL LOW (ref 4.22–5.81)
RDW: 13.4 % (ref 11.5–15.5)
RDW: 13.2 % (ref 11.5–15.5)
WBC: 6.9 10*3/uL (ref 4.0–10.5)
WBC: 6.4 10*3/uL (ref 4.0–10.5)
nRBC: 0 % (ref 0.0–0.2)
nRBC: 0 % (ref 0.0–0.2)

## 2020-10-06 LAB — GLUCOSE, CAPILLARY
Glucose-Capillary: 84 mg/dL (ref 70–99)
Glucose-Capillary: 91 mg/dL (ref 70–99)
Glucose-Capillary: 208 mg/dL — ABNORMAL HIGH (ref 70–99)
Glucose-Capillary: 87 mg/dL (ref 70–99)
Glucose-Capillary: 87 mg/dL (ref 70–99)
Glucose-Capillary: 103 mg/dL — ABNORMAL HIGH (ref 70–99)
Glucose-Capillary: 78 mg/dL (ref 70–99)

## 2020-10-06 LAB — COMPREHENSIVE METABOLIC PANEL
ALT: 36 U/L (ref 0–44)
AST: 42 U/L — ABNORMAL HIGH (ref 15–41)
Albumin: 2.2 g/dL — ABNORMAL LOW (ref 3.5–5.0)
Alkaline Phosphatase: 47 U/L (ref 38–126)
Anion gap: 9 (ref 5–15)
BUN: 15 mg/dL (ref 6–20)
CO2: 36 mmol/L — ABNORMAL HIGH (ref 22–32)
Calcium: 7.9 mg/dL — ABNORMAL LOW (ref 8.9–10.3)
Chloride: 85 mmol/L — ABNORMAL LOW (ref 98–111)
Creatinine, Ser: 0.59 mg/dL — ABNORMAL LOW (ref 0.61–1.24)
GFR, Estimated: >60 mL/min (ref >60)
Glucose, Bld: 387 mg/dL — ABNORMAL HIGH (ref 70–99)
Potassium: 3.2 mmol/L — ABNORMAL LOW (ref 3.5–5.1)
Sodium: 130 mmol/L — ABNORMAL LOW (ref 135–145)
Total Bilirubin: 0.8 mg/dL (ref 0.3–1.2)
Total Protein: 4.8 g/dL — ABNORMAL LOW (ref 6.5–8.1)

## 2020-10-06 LAB — BASIC METABOLIC PANEL
Anion gap: 7 (ref 5–15)
BUN: 18 mg/dL (ref 6–20)
CO2: 37 mmol/L — ABNORMAL HIGH (ref 22–32)
Calcium: 8.2 mg/dL — ABNORMAL LOW (ref 8.9–10.3)
Chloride: 86 mmol/L — ABNORMAL LOW (ref 98–111)
Creatinine, Ser: 0.58 mg/dL — ABNORMAL LOW (ref 0.61–1.24)
GFR, Estimated: >60 mL/min (ref >60)
Glucose, Bld: 363 mg/dL — ABNORMAL HIGH (ref 70–99)
Potassium: 3.4 mmol/L — ABNORMAL LOW (ref 3.5–5.1)
Sodium: 130 mmol/L — ABNORMAL LOW (ref 135–145)

## 2020-10-06 LAB — MAGNESIUM
Magnesium: 1.8 mg/dL (ref 1.7–2.4)
Magnesium: 1.7 mg/dL (ref 1.7–2.4)

## 2020-10-06 MED ORDER — VALPROATE SODIUM 500 MG/5ML IV SOLN
500.0000 mg | Freq: Three times a day (TID) | INTRAVENOUS | Status: DC
  Administered 2020-10-06 – 2020-10-07 (×3): 500 mg via INTRAVENOUS
  Filled 2020-10-06 (×6): qty 5

## 2020-10-06 MED ORDER — FUROSEMIDE 10 MG/ML IJ SOLN
40.0000 mg | Freq: Once | INTRAMUSCULAR | Status: AC
  Administered 2020-10-06: 40 mg via INTRAVENOUS
  Filled 2020-10-06: qty 4

## 2020-10-06 MED ORDER — POTASSIUM CHLORIDE 10 MEQ/50ML IV SOLN
10.0000 meq | INTRAVENOUS | Status: AC
  Administered 2020-10-06 (×6): 10 meq via INTRAVENOUS
  Filled 2020-10-06 (×5): qty 50

## 2020-10-06 MED ORDER — MAGNESIUM SULFATE 4 GM/100ML IV SOLN
4.0000 g | Freq: Once | INTRAVENOUS | Status: AC
  Administered 2020-10-06: 4 g via INTRAVENOUS
  Filled 2020-10-06: qty 100

## 2020-10-06 NOTE — Progress Notes (Signed)
eLink Physician-Brief Progress Note Patient Name: Jeffrey [name] DOB: 1961/11/20 MRN: [id number]   Date of Service  10/06/2020  HPI/Events of Note  Nursing request for AM labs.   eICU Interventions  Will order CBC with platelets, BMP and Mg++ level at 5 AM.      Intervention Category Major Interventions: Other:  Kristoff Coonradt Dennard Nip 10/06/2020, 1:19 AM

## 2020-10-06 NOTE — Progress Notes (Signed)
NAME:  Jeffrey [name], MRN:  [id number], DOB:  [birth year], LOS: 7 ADMISSION DATE:  09/29/2020, CONSULTATION DATE: 09/30/20 REFERRING MD:  Dr. Maurilio Lovely, [facility], CHIEF COMPLAINT:  Respiratory failure   Brief History   59 yo male former smoker with hx of COPD on 3 to 4 liters oxygen at home brought to ER in respiratory distress with altered mental status.  SpO2 78% on room air.  He required intubation in the ER.  He is followed in pulmonary office by Dr. Sherene Sires.    Past Medical History  COPD, Bipolar, Diastolic CHF, Hepatic encephalopathy, Depression, Polysubstance abuse, [name], ETOH, GI bleed, HTN, GERD  Significant Hospital Events   11/07 Admit 11/08 extubated, needed Bipap after 11/09 altered mental status with ?seizure versus withdrawal 11/10 started on precedex; Palliative care consulted >> family wishes to continue aggressive therapies 11/11 arrange for transfer to [place] 11/13 admitted to [facility] MICU  Consults:  Palliative care Neurology  Procedures:  ETT 11/07 >> 11/10  Significant Diagnostic Tests:   PFT 10/09/16 >> 1.84 (49%), FEV1% 48, TLC 7.39 (105%), RV 3.59 (166%), DLCO 36%  A1AT 10/09/16 >> 215, MM  Echo 10/12/19 >> EF 65 to 70%, grade 1 DD, mild/mod LA dilation, mild MR, mild/mod AR  MRI brain 10/03/20 >> mild atrophy  EEG 10/03/20 >> generalized slowing  Micro Data:  COVID/Flu 09/29/20 >> negative MRSA PCR 09/29/20 >> negative Blood 09/29/20 >>   Antimicrobials:  Zithromax 11/06  Rocephin 11/06  Vancomycin 11/07  Zosyn 11/07 >> 11/09  Interim history/subjective:   Doing better this morning. Still requiring precedex infusion   Objective   Blood pressure 129/70, pulse 77, temperature 99 F (37.2 C), resp. rate (!) 31, height 5\' 10"  (1.778 m), weight 85.2 kg, SpO2 95 %.    FiO2 (%):  [35 %] 35 %   Intake/Output Summary (Last 24 hours) at 10/06/2020 0711 Last data filed at 10/06/2020 0631 Gross per 24 hour  Intake 1826.48 ml  Output 1300 ml  Net  526.48 ml   Filed Weights   10/04/20 0400 10/05/20 1335 10/06/20 0500  Weight: 85 kg 84.5 kg 85.2 kg    Examination: General - chronically ill appearing male, elderly  Eyes - tracking appropriately  ENT - off bipap, does have some skin breakdown on nose  Cardiac - RRR, s1 s2  Chest - barrel chested Abdomen - soft nt nd  Extremities - no edema, decreased muscle mass  Neuro - alert to voice  Resolved Hospital Problem list   Lactic acidosis from hypoxia  Assessment & Plan:   Acute on chronic hypoxic/hypercapnic respiratory failure from COPD exacerbation. Plan: BIPAP QHS and PRN  Continue solumedrol  Continue pulmicort, brovanna, yupelri  Off abx   Acute pulmonary edema. Positive cumulative fluid balance  - follow up UOP - lasix x1, 40mg  today   Acute metabolic encephalopathy from hypoxia and multifactorial reasons below Hx of polysubstance abuse, ETOH, depression, bipolar. - continue precedex - holding sedating meds  - continue valproic acid  - hold BDZs but may need them if there are signs of withdrawal   - goal to wean off precedex   Hypokalemia - repleting again today   Anemia of critical illness and chronic disease. - follow cbc, conservative transfusion threshold   Hx of HTN, [name], Chronic diastolic CHF. - prn bb(-)  - diuresis to maintain euvolemia    Best practice:  Diet: NPO DVT prophylaxis: Lovenox GI prophylaxis: Protonix Mobility: bed rest Code Status: full code Disposition:  ICU   Labs    CMP Latest Ref Rng & Units 10/06/2020 10/06/2020 10/05/2020  Glucose 70 - 99 mg/dL 578(I) 696(E) 98  BUN 6 - 20 mg/dL 15 18 95(M)  Creatinine 0.61 - 1.24 mg/dL 8.41(L) 2.44(W) 1.02(V)  Sodium 135 - 145 mmol/L 130(L) 130(L) 142  Potassium 3.5 - 5.1 mmol/L 3.2(L) 3.4(L) 3.3(L)  Chloride 98 - 111 mmol/L 85(L) 86(L) 90(L)  CO2 22 - 32 mmol/L 36(H) 37(H) 42(H)  Calcium 8.9 - 10.3 mg/dL 7.9(L) 8.2(L) 8.6(L)  Total Protein 6.5 - 8.1 g/dL 4.8(L) - -  Total  Bilirubin 0.3 - 1.2 mg/dL 0.8 - -  Alkaline Phos 38 - 126 U/L 47 - -  AST 15 - 41 U/L 42(H) - -  ALT 0 - 44 U/L 36 - -    CBC Latest Ref Rng & Units 10/06/2020 10/06/2020 10/05/2020  WBC 4.0 - 10.5 K/uL 6.4 6.9 7.6  Hemoglobin 13.0 - 17.0 g/dL 2.5(D) 8.9(L) 9.7(L)  Hematocrit 39 - 52 % 28.0(L) 27.7(L) 31.3(L)  Platelets 150 - 400 K/uL 271 266 310    ABG    Component Value Date/Time   PHART 7.387 10/04/2020 0844   PCO2ART 68.3 (HH) 10/04/2020 0844   PO2ART 90.2 10/04/2020 0844   HCO3 36.8 (H) 10/04/2020 0844   TCO2 34 (H) 12/21/2018 0357   ACIDBASEDEF 0.6 02/17/2019 1850   O2SAT 96.9 10/04/2020 0844    CBG (last 3)  Recent Labs    10/06/20 0317 10/06/20 0434 10/06/20 0554  GLUCAP 84 91 208*    This patient is critically ill with multiple organ system failure; which, requires frequent high complexity decision making, assessment, support, evaluation, and titration of therapies. This was completed through the application of advanced monitoring technologies and extensive interpretation of multiple databases. During this encounter critical care time was devoted to patient care services described in this note for 32 minutes.  Josephine Igo, DO [place] [facility] 10/06/2020 7:11 AM

## 2020-10-06 NOTE — Progress Notes (Signed)
CBGs all day have been 80s-90s, however, Glucose on morning labs showed >300.  Redrew CBG from CVP: 208.  Reported to E-link discrepancy in lab and CBG. Awaiting further instruction.

## 2020-10-07 DIAGNOSIS — J441 Chronic obstructive pulmonary disease with (acute) exacerbation: Secondary | ICD-10-CM | POA: Diagnosis not present

## 2020-10-07 DIAGNOSIS — J9622 Acute and chronic respiratory failure with hypercapnia: Secondary | ICD-10-CM | POA: Diagnosis not present

## 2020-10-07 DIAGNOSIS — J9602 Acute respiratory failure with hypercapnia: Secondary | ICD-10-CM

## 2020-10-07 DIAGNOSIS — J9621 Acute and chronic respiratory failure with hypoxia: Secondary | ICD-10-CM | POA: Diagnosis not present

## 2020-10-07 LAB — CBC
HCT: 29.6 % — ABNORMAL LOW (ref 39.0–52.0)
Hemoglobin: 9.7 g/dL — ABNORMAL LOW (ref 13.0–17.0)
MCH: 27.9 pg (ref 26.0–34.0)
MCHC: 32.8 g/dL (ref 30.0–36.0)
MCV: 85.1 fL (ref 80.0–100.0)
Platelets: 332 10*3/uL (ref 150–400)
RBC: 3.48 MIL/uL — ABNORMAL LOW (ref 4.22–5.81)
RDW: 13.2 % (ref 11.5–15.5)
WBC: 6.8 10*3/uL (ref 4.0–10.5)
nRBC: 0 % (ref 0.0–0.2)

## 2020-10-07 LAB — COMPREHENSIVE METABOLIC PANEL
ALT: 35 U/L (ref 0–44)
AST: 33 U/L (ref 15–41)
Albumin: 2.4 g/dL — ABNORMAL LOW (ref 3.5–5.0)
Alkaline Phosphatase: 56 U/L (ref 38–126)
Anion gap: 9 (ref 5–15)
BUN: 11 mg/dL (ref 6–20)
CO2: 31 mmol/L (ref 22–32)
Calcium: 7.6 mg/dL — ABNORMAL LOW (ref 8.9–10.3)
Chloride: 92 mmol/L — ABNORMAL LOW (ref 98–111)
Creatinine, Ser: 0.58 mg/dL — ABNORMAL LOW (ref 0.61–1.24)
GFR, Estimated: >60 mL/min (ref >60)
Glucose, Bld: 252 mg/dL — ABNORMAL HIGH (ref 70–99)
Potassium: 2.8 mmol/L — ABNORMAL LOW (ref 3.5–5.1)
Sodium: 132 mmol/L — ABNORMAL LOW (ref 135–145)
Total Bilirubin: 0.8 mg/dL (ref 0.3–1.2)
Total Protein: 5.1 g/dL — ABNORMAL LOW (ref 6.5–8.1)

## 2020-10-07 LAB — GLUCOSE, CAPILLARY
Glucose-Capillary: 100 mg/dL — ABNORMAL HIGH (ref 70–99)
Glucose-Capillary: 101 mg/dL — ABNORMAL HIGH (ref 70–99)
Glucose-Capillary: 69 mg/dL — ABNORMAL LOW (ref 70–99)
Glucose-Capillary: 82 mg/dL (ref 70–99)
Glucose-Capillary: 85 mg/dL (ref 70–99)
Glucose-Capillary: 87 mg/dL (ref 70–99)
Glucose-Capillary: 91 mg/dL (ref 70–99)

## 2020-10-07 LAB — AMMONIA: Ammonia: 29 umol/L (ref 9–35)

## 2020-10-07 MED ORDER — DIVALPROEX SODIUM ER 500 MG PO TB24
1500.0000 mg | ORAL_TABLET | Freq: Every day | ORAL | Status: DC
  Administered 2020-10-07 – 2020-10-08 (×2): 1500 mg via ORAL
  Filled 2020-10-07 (×3): qty 3

## 2020-10-07 MED ORDER — POTASSIUM CHLORIDE 10 MEQ/50ML IV SOLN
10.0000 meq | INTRAVENOUS | Status: AC
  Administered 2020-10-07 (×4): 10 meq via INTRAVENOUS
  Filled 2020-10-07 (×4): qty 50

## 2020-10-07 MED ORDER — PREDNISONE 20 MG PO TABS
40.0000 mg | ORAL_TABLET | Freq: Every day | ORAL | Status: DC
  Administered 2020-10-08: 40 mg via ORAL
  Filled 2020-10-07: qty 2

## 2020-10-07 MED ORDER — GERHARDT'S BUTT CREAM
TOPICAL_CREAM | CUTANEOUS | Status: DC | PRN
  Administered 2020-10-07: 1 via TOPICAL
  Filled 2020-10-07: qty 1

## 2020-10-07 MED ORDER — DILTIAZEM HCL ER COATED BEADS 180 MG PO CP24
180.0000 mg | ORAL_CAPSULE | Freq: Every day | ORAL | Status: DC
  Administered 2020-10-07 – 2020-10-09 (×3): 180 mg via ORAL
  Filled 2020-10-07 (×3): qty 1

## 2020-10-07 MED ORDER — POTASSIUM CHLORIDE CRYS ER 20 MEQ PO TBCR
40.0000 meq | EXTENDED_RELEASE_TABLET | Freq: Two times a day (BID) | ORAL | Status: AC
  Administered 2020-10-07 (×2): 40 meq via ORAL
  Filled 2020-10-07 (×2): qty 2

## 2020-10-07 MED ORDER — OLANZAPINE 2.5 MG PO TABS
2.5000 mg | ORAL_TABLET | Freq: Every day | ORAL | Status: DC
  Administered 2020-10-07 – 2020-10-08 (×2): 2.5 mg via ORAL
  Filled 2020-10-07 (×3): qty 1

## 2020-10-07 MED ORDER — DEXTROSE 50 % IV SOLN
INTRAVENOUS | Status: AC
  Administered 2020-10-07: 50 mL
  Filled 2020-10-07: qty 50

## 2020-10-07 MED ORDER — FLUOXETINE HCL 20 MG PO CAPS
20.0000 mg | ORAL_CAPSULE | Freq: Every day | ORAL | Status: DC
  Administered 2020-10-07 – 2020-10-09 (×3): 20 mg via ORAL
  Filled 2020-10-07 (×3): qty 1

## 2020-10-07 NOTE — Evaluation (Addendum)
Physical Therapy Evaluation Patient Details Name: MARGUES EXUME MRN: [id number] DOB: 1961-05-27 Today's Date: 10/07/2020   History of Present Illness  Pt is a 59 y/o M presenting to the ED on 11/7 for SOB. Pt was intubated on 11/7 and extubated on the 8th. 11/13 was transferred to [facility] MICU. PMH includes COPD, anemia, anxiety, depression, arthritis, bipolar disorder, CHF, HTN, [name] and hyperlipidemia.    Clinical Impression  Pt presents to PT with limitations in orientation/cognition and cardiovascular endurance that is limiting his independence with functional mobility. Pt needed 5L of O2 in order to maintain SpO2 above 90% during functional activity. Pt would benefit from seeing skilled PT in the acute setting to work on perform therapeutic activity and exercise as well as gait and stair training in order to return to PLOF and return home. Pt would benefit from home health at d/c secondary to limited overall cardiovascular endurance with mobility.    Follow Up Recommendations Home health PT;Supervision/Assistance - 24 hour    Equipment Recommendations  3in1 (PT)    Recommendations for Other Services       Precautions / Restrictions Precautions Precautions: Fall      Mobility  Bed Mobility Overal bed mobility: Needs Assistance       Supine to sit: Min guard Sit to supine: Min guard   General bed mobility comments: reliance on bed rail; pt needed min guarding for safety to prevent him from getting up without prompting    Transfers Overall transfer level: Needs assistance Equipment used: 2 person hand held assist Transfers: Sit to/from Stand Sit to Stand: Min assist         General transfer comment: pt needed multiple multimodal cues to perform safe transfer; pt had increased trunk flexion during sit to stand transfer  Ambulation/Gait   Gait Distance (Feet): 108 Feet Assistive device: 1 person hand held assist;IV Pole Gait Pattern/deviations: Wide base of  support;Decreased stride length   Gait velocity interpretation: <1.31 ft/sec, indicative of household ambulator General Gait Details: PT had to stabilize IV pole while pt had mod reliance on it, despite PT trying to perform hand held assist  Stairs            Wheelchair Mobility    Modified Rankin (Stroke Patients Only)       Balance                                             Pertinent Vitals/Pain Pain Assessment: Faces Faces Pain Scale: Hurts a little bit Pain Location: bottom Pain Descriptors / Indicators: Discomfort;Grimacing Pain Intervention(s): Limited activity within patient's tolerance    Home Living Family/patient expects to be discharged to:: Private residence Living Arrangements: Spouse/significant other Available Help at Discharge: Family;Available 24 hours/day Type of Home: Other(Comment) (double wide) Home Access: Stairs to enter Entrance Stairs-Rails: Left Entrance Stairs-Number of Steps: 2 Home Layout: One level Home Equipment: Walker - 2 wheels;Cane - single point      Prior Function Level of Independence: Independent               Hand Dominance   Dominant Hand: Right    Extremity/Trunk Assessment   Upper Extremity Assessment Upper Extremity Assessment: Overall WFL for tasks assessed    Lower Extremity Assessment Lower Extremity Assessment: Overall WFL for tasks assessed    Cervical / Trunk Assessment Cervical / Trunk Assessment: Normal  Communication   Communication: No difficulties  Cognition Arousal/Alertness: Awake/alert Behavior During Therapy: Flat affect Overall Cognitive Status: Impaired/Different from baseline Area of Impairment: Orientation;Attention;Memory;Following commands;Safety/judgement;Awareness;Problem solving                 Orientation Level: Disoriented to;Time;Place;Situation Current Attention Level: Sustained Memory: Decreased short-term memory Following Commands: Follows  one step commands with increased time;Follows one step commands inconsistently Safety/Judgement: Decreased awareness of safety;Decreased awareness of deficits Awareness: Intellectual Problem Solving: Slow processing;Decreased initiation;Difficulty sequencing;Requires verbal cues General Comments: pt had delayed processing when asked questions; pt also had slight impulsivity/lack of awareness of safety even when prompted to wait for assistance to get out of bed; pt kept trying to 'get out of here' and go to the bathroom to exit the room      General Comments      Exercises     Assessment/Plan    PT Assessment Patient needs continued PT services  PT Problem List Decreased strength;Decreased cognition;Decreased activity tolerance;Decreased safety awareness;Decreased mobility;Cardiopulmonary status limiting activity       PT Treatment Interventions DME instruction;Gait training;Stair training;Functional mobility training;Therapeutic activities;Therapeutic exercise;Patient/family education;Cognitive remediation    PT Goals (Current goals can be found in the Care Plan section)  Acute Rehab PT Goals Patient Stated Goal: to get better and go home PT Goal Formulation: With patient/family Time For Goal Achievement: 10/21/20 Potential to Achieve Goals: Fair    Frequency Min 3X/week   Barriers to discharge        Co-evaluation               AM-PAC PT "6 Clicks" Mobility  Outcome Measure Help needed turning from your back to your side while in a flat bed without using bedrails?: A Lot Help needed moving from lying on your back to sitting on the side of a flat bed without using bedrails?: A Lot Help needed moving to and from a bed to a chair (including a wheelchair)?: A Little Help needed standing up from a chair using your arms (e.g., wheelchair or bedside chair)?: A Little Help needed to walk in hospital room?: A Little Help needed climbing 3-5 steps with a railing? : A Lot 6  Click Score: 15    End of Session Equipment Utilized During Treatment: Gait belt Activity Tolerance: Patient tolerated treatment well Patient left: in chair;with family/visitor present;with call bell/phone within reach;with chair alarm set Nurse Communication: Mobility status PT Visit Diagnosis: Muscle weakness (generalized) (M62.81);Difficulty in walking, not elsewhere classified (R26.2)    Time: 1230-1301 PT Time Calculation (min) (ACUTE ONLY): 31 min   Charges:   PT Evaluation $PT Eval Moderate Complexity: 1 Mod PT Treatments $Gait Training: 8-22 mins        Jeri Cos, SPT [id number]  Karessa Onorato 10/07/2020, 1:55 PM

## 2020-10-07 NOTE — Progress Notes (Signed)
PROGRESS NOTE    Jeffrey [name]  XIP:[id number] DOB: 12-14-1960 DOA: 09/29/2020 PCP: Lemmie Evens, MD  Brief Narrative: 59 yo male former smoker with hx of COPD on 3 to 4 liters oxygen at home, depression, bipolar disorder, chronic diastolic CHF, hepatic encephalopathy, polysubstance abuse was brought to ER in respiratory distress, admitted with acute hypoxic and hypercarbic respiratory failure requiring intubation at [facility] he was then extubated following which he required BiPAP.  Hospital course complicated by metabolic encephalopathy requiring Precedex, as needed Ativan and fentanyl -Subsequently transferred to [facility], also seen by palliative team, family wish to continue aggressive care -Transferred from [facility] to [facility] service today 11/15   Assessment & Plan:   Acute on chronic hypoxic and hypercarbic respiratory failure COPD exacerbation -Clinically improving, required mechanical ventilation followed by BiPAP this admission -Steroids cut down, now on Solu-Medrol 40 mg daily, will change to prednisone taper -Continue BiPAP nightly and as needed -Continue nebs, completed antibiotic course -Will consult case management for trilogy -NIPPV -Transfer out of ICU today -Ambulate, PT OT  Pulmonary edema Acute on chronic diastolic CHF -Diuresed with IV Lasix this admission, clinically appears euvolemic now -Restart oral diuretics from tomorrow  Acute metabolic encephalopathy -Likely multifactorial, hypoxia, ICU delirium, steroids, withdrawal from benzos and or other psychotropic meds -Required Precedex this admission, now off -Restart valproate per home regimen -Also restart Prozac and Zyprexa at a lower dose  Hypokalemia -Replete, labs in a.m.  Anemia of critical illness and chronic disease  Bipolar disorder Depression -Resuming some home meds as listed above  Palliative care encounter -Seen by palliative team this admission at [facility], status post family  meeting, family wishes for full code and full scope of treatment  DVT prophylaxis: Lovenox Code Status: Full code Family Communication: Discussed with patient in detail, no family at bedside Disposition Plan:  Status is: Inpatient  Remains inpatient appropriate because:Inpatient level of care appropriate due to severity of illness   Dispo:  Patient From: Home  Planned Disposition: Home  Expected discharge date: 10/09/20  Medically stable for discharge: No    Consultants:   PCCM transfer   Procedures:   Antimicrobials:    Subjective: -Confusion improving, breathing better overall  Objective: Vitals:   10/07/20 0800 10/07/20 0900 10/07/20 0938 10/07/20 1000  BP: (!) 150/115 (!) 154/77 (!) 144/73 (!) 150/82  Pulse: 88 89 85 83  Resp: (!) 21 (!) 24 20 (!) 21  Temp: 99.3 F (37.4 C) 99.9 F (37.7 C) 99.7 F (37.6 C) 99.7 F (37.6 C)  TempSrc: Bladder  Bladder   SpO2: 94% 96% 98% 98%  Weight:      Height:        Intake/Output Summary (Last 24 hours) at 10/07/2020 1123 Last data filed at 10/07/2020 1100 Gross per 24 hour  Intake 2026.83 ml  Output 2175 ml  Net -148.17 ml   Filed Weights   10/05/20 1335 10/06/20 0500 10/07/20 0451  Weight: 84.5 kg 85.2 kg 79 kg    Examination:  General exam: Chronically ill male appears older than stated age, awake alert oriented to self and partly to place only CVS: S1-S2, regular rate rhythm Lungs: Distant breath sounds, no wheezes Abdomen: Soft, nontender, bowel sounds present Extremities: No edema Psychiatry: Poor insight and judgment    Data Reviewed:   CBC: Recent Labs  Lab 10/04/20 0400 10/05/20 0352 10/06/20 0209 10/06/20 0436 10/07/20 0815  WBC 7.2 7.6 6.9 6.4 6.8  NEUTROABS  --   --  5.3 4.9  --   HGB 9.9* 9.7* 8.9* 8.8* 9.7*  HCT 33.2* 31.3* 27.7* 28.0* 29.6*  MCV 92.7 88.2 88.2 87.8 85.1  PLT 280 310 266 271 245   Basic Metabolic Panel: Recent Labs  Lab 10/01/20 0427 10/01/20 0427  10/02/20 0554 10/02/20 0554 10/03/20 0534 10/03/20 0534 10/04/20 0400 10/05/20 0352 10/06/20 0209 10/06/20 0436 10/07/20 0815  NA 132*   < > 137   < > 142   < > 146* 142 130* 130* 132*  K 3.6   < > 3.7   < > 4.2   < > 3.9 3.3* 3.4* 3.2* 2.8*  CL 92*   < > 98   < > 100   < > 97* 90* 86* 85* 92*  CO2 30   < > 32   < > 35*   < > 39* 42* 37* 36* 31  GLUCOSE 107*   < > 105*   < > 84   < > 95 98 363* 387* 252*  BUN 19   < > 22*   < > 21*   < > 25* 23* _0 CREATININE 0.52*   < > 0.50*   < > 0.54*   < > 0.49* 0.51* 0.58* 0.59* 0.58*  CALCIUM 8.4*   < > 8.3*   < > 8.4*   < > 8.9 8.6* 8.2* 7.9* 7.6*  MG 2.0   < > 2.2   < > 2.2  --  2.1 2.0 1.8 1.7  --   PHOS 2.4*  --  3.0  --   --   --   --   --   --   --   --    < > = values in this interval not displayed.   GFR: Estimated Creatinine Clearance: 101.4 mL/min (A) (by C-G formula based on SCr of 0.58 mg/dL (L)). Liver Function Tests: Recent Labs  Lab 10/03/20 0534 10/04/20 0400 10/06/20 0436 10/07/20 0815  AST 19 29 42* 33  ALT 17 23 36 35  ALKPHOS 48 54 47 56  BILITOT 0.3 0.6 0.8 0.8  PROT 5.6* 5.6* 4.8* 5.1*  ALBUMIN 2.5* 2.5* 2.2* 2.4*   No results for input(s): LIPASE, AMYLASE in the last 168 hours. Recent Labs  Lab 10/07/20 0815  AMMONIA 29   Coagulation Profile: No results for input(s): INR, PROTIME in the last 168 hours. Cardiac Enzymes: No results for input(s): CKTOTAL, CKMB, CKMBINDEX, TROPONINI in the last 168 hours. BNP (last 3 results) No results for input(s): PROBNP in the last 8760 hours. HbA1C: No results for input(s): HGBA1C in the last 72 hours. CBG: Recent Labs  Lab 10/06/20 0554 10/06/20 0741 10/06/20 1102 10/06/20 1531 10/06/20 2002  GLUCAP 208* 87 87 103* 78   Lipid Profile: No results for input(s): CHOL, HDL, LDLCALC, TRIG, CHOLHDL, LDLDIRECT in the last 72 hours. Thyroid Function Tests: No results for input(s): TSH, T4TOTAL, FREET4, T3FREE, THYROIDAB in the last 72 hours. Anemia  Panel: No results for input(s): VITAMINB12, FOLATE, FERRITIN, TIBC, IRON, RETICCTPCT in the last 72 hours. Urine analysis:    Component Value Date/Time   COLORURINE YELLOW 09/29/2020 0340   APPEARANCEUR CLEAR 09/29/2020 0340   LABSPEC 1.009 09/29/2020 0340   PHURINE 7.0 09/29/2020 0340   GLUCOSEU NEGATIVE 09/29/2020 0340   HGBUR MODERATE (A) 09/29/2020 0340   BILIRUBINUR NEGATIVE 09/29/2020 0340   KETONESUR NEGATIVE 09/29/2020 0340   PROTEINUR NEGATIVE 09/29/2020 0340   UROBILINOGEN 0.2 03/01/2011 1427  NITRITE NEGATIVE 09/29/2020 0340   LEUKOCYTESUR NEGATIVE 09/29/2020 0340   Sepsis Labs: _0 (procalcitonin:4,lacticidven:4)  ) Recent Results (from the past 240 hour(s))  Respiratory Panel by RT PCR (Flu A&B, Covid) - Nasopharyngeal Swab     Status: None   Collection Time: 09/29/20  2:20 AM   Specimen: Nasopharyngeal Swab  Result Value Ref Range Status   SARS Coronavirus 2 by RT PCR NEGATIVE NEGATIVE Final    Comment: (NOTE) SARS-CoV-2 target nucleic acids are NOT DETECTED.  The SARS-CoV-2 RNA is generally detectable in upper respiratoy specimens during the acute phase of infection. The lowest concentration of SARS-CoV-2 viral copies this assay can detect is 131 copies/mL. A negative result does not preclude SARS-Cov-2 infection and should not be used as the sole basis for treatment or other patient management decisions. A negative result may occur with  improper specimen collection/handling, submission of specimen other than nasopharyngeal swab, presence of viral mutation(s) within the areas targeted by this assay, and inadequate number of viral copies (<131 copies/mL). A negative result must be combined with clinical observations, patient history, and epidemiological information. The expected result is Negative.  Fact Sheet for Patients:  PinkCheek.be  Fact Sheet for Healthcare Providers:   GravelBags.it  This test is no t yet approved or cleared by the [place] FDA and  has been authorized for detection and/or diagnosis of SARS-CoV-2 by FDA under an Emergency Use Authorization (EUA). This EUA will remain  in effect (meaning this test can be used) for the duration of the COVID-19 declaration under Section 564(b)(1) of the Act, 21 U.S.C. section 360bbb-3(b)(1), unless the authorization is terminated or revoked sooner.     Influenza A by PCR NEGATIVE NEGATIVE Final   Influenza B by PCR NEGATIVE NEGATIVE Final    Comment: (NOTE) The Xpert Xpress SARS-CoV-2/FLU/RSV assay is intended as an aid in  the diagnosis of influenza from Nasopharyngeal swab specimens and  should not be used as a sole basis for treatment. Nasal washings and  aspirates are unacceptable for Xpert Xpress SARS-CoV-2/FLU/RSV  testing.  Fact Sheet for Patients: PinkCheek.be  Fact Sheet for Healthcare Providers: GravelBags.it  This test is not yet approved or cleared by the [place] FDA and  has been authorized for detection and/or diagnosis of SARS-CoV-2 by  FDA under an Emergency Use Authorization (EUA). This EUA will remain  in effect (meaning this test can be used) for the duration of the  Covid-19 declaration under Section 564(b)(1) of the Act, 21  U.S.C. section 360bbb-3(b)(1), unless the authorization is  terminated or revoked. Performed at [facility], [address], [place], [place] [address]   Blood culture (routine x 2)     Status: None   Collection Time: 09/29/20  3:25 AM   Specimen: BLOOD LEFT HAND  Result Value Ref Range Status   Specimen Description BLOOD LEFT HAND  Final   Special Requests   Final    BOTTLES DRAWN AEROBIC AND ANAEROBIC Blood Culture adequate volume   Culture   Final    NO GROWTH 6 DAYS Performed at [facility], [address], [place], [place] [address]    Report  Status 10/05/2020 FINAL  Final  Blood culture (routine x 2)     Status: None   Collection Time: 09/29/20  3:26 AM   Specimen: BLOOD RIGHT HAND  Result Value Ref Range Status   Specimen Description BLOOD RIGHT HAND  Final   Special Requests   Final    BOTTLES DRAWN AEROBIC AND  ANAEROBIC Blood Culture adequate volume   Culture   Final    NO GROWTH 6 DAYS Performed at [facility], [address] [place], [place] [address]    Report Status 10/05/2020 FINAL  Final  MRSA PCR Screening     Status: None   Collection Time: 09/29/20  1:42 PM   Specimen: Nasal Mucosa; Nasopharyngeal  Result Value Ref Range Status   MRSA by PCR NEGATIVE NEGATIVE Final    Comment:        The GeneXpert MRSA Assay (FDA approved for NASAL specimens only), is one component of a comprehensive MRSA colonization surveillance program. It is not intended to diagnose MRSA infection nor to guide or monitor treatment for MRSA infections. Performed at [facility], [address], [place], [place] [address]          Radiology Studies: No results found.      Scheduled Meds: . arformoterol  15 mcg Nebulization BID  . budesonide (PULMICORT) nebulizer solution  0.5 mg Nebulization BID  . chlorhexidine  15 mL Mouth Rinse BID  . chlorhexidine gluconate (MEDLINE KIT)  15 mL Mouth Rinse BID  . Chlorhexidine Gluconate Cloth  6 each Topical Q0600  . diltiazem  180 mg Oral Daily  . divalproex  1,500 mg Oral QHS  . enoxaparin (LOVENOX) injection  40 mg Subcutaneous Q24H  . mouth rinse  15 mL Mouth Rinse q12n4p  . methylPREDNISolone (SOLU-MEDROL) injection  40 mg Intravenous Daily  . revefenacin  175 mcg Nebulization Daily  . sodium chloride flush  10-40 mL Intracatheter Q12H   Continuous Infusions:   LOS: 8 days    Time spent: 35mn    PDomenic Polite MD [facility] 10/07/2020, 11:23 AM

## 2020-10-07 NOTE — Progress Notes (Signed)
NAME:  Jeffrey [name], MRN:  [id number], DOB:  1961-02-19, LOS: 8 ADMISSION DATE:  09/29/2020, CONSULTATION DATE: 09/30/20 REFERRING MD:  Dr. Maurilio Lovely, [facility], CHIEF COMPLAINT:  Respiratory failure   Brief History   59 yo male former smoker with hx of COPD on 3 to 4 liters oxygen at home brought to ER in respiratory distress with altered mental status.  SpO2 78% on room air.  He required intubation in the ER.  He is followed in pulmonary office by Dr. Sherene Sires.    Past Medical History  COPD, Bipolar, Diastolic CHF, Hepatic encephalopathy, Depression, Polysubstance abuse, [name], ETOH, GI bleed, HTN, GERD  Significant Hospital Events   11/07 Admit 11/08 extubated, needed Bipap after 11/09 altered mental status with ?seizure versus withdrawal 11/10 started on precedex; Palliative care consulted >> family wishes to continue aggressive therapies 11/11 arrange for transfer to [place] 11/13 admitted to [facility] MICU  Consults:  Palliative care Neurology  Procedures:  ETT 11/07 >> 11/10  Significant Diagnostic Tests:   PFT 10/09/16 >> 1.84 (49%), FEV1% 48, TLC 7.39 (105%), RV 3.59 (166%), DLCO 36%  A1AT 10/09/16 >> 215, MM  Echo 10/12/19 >> EF 65 to 70%, grade 1 DD, mild/mod LA dilation, mild MR, mild/mod AR  MRI brain 10/03/20 >> mild atrophy  EEG 10/03/20 >> generalized slowing  Micro Data:  COVID/Flu 09/29/20 >> negative MRSA PCR 09/29/20 >> negative Blood 09/29/20 >>   Antimicrobials:  Zithromax 11/06  Rocephin 11/06  Vancomycin 11/07  Zosyn 11/07 >> 11/09  Interim history/subjective:   Patient doing well this morning. He is more alert. Following commands. No distress.   Objective   Blood pressure (!) 143/78, pulse 84, temperature 99.3 F (37.4 C), resp. rate (!) 30, height 5\' 10"  (1.778 m), weight 79 kg, SpO2 97 %.        Intake/Output Summary (Last 24 hours) at 10/07/2020 0705 Last data filed at 10/07/2020 0600 Gross per 24 hour  Intake 1516.39 ml  Output 4375 ml   Net -2858.61 ml   Filed Weights   10/05/20 1335 10/06/20 0500 10/07/20 0451  Weight: 84.5 kg 85.2 kg 79 kg    Examination: General - elderly male, chronically ill appearing  Eyes - tracking appropriately Cardiac - RRR, s1 s2  Chest - barrel chested  Abdomen - soft nt nd Extremities - no edema Neuro - alert to voice, following commands   Resolved Hospital Problem list   Lactic acidosis from hypoxia  Assessment & Plan:   Acute on chronic hypoxic/hypercapnic respiratory failure from COPD exacerbation. Plan: BIPAP PRN and QHS  Continue steroids, would prefer steroid taper Pulmicort, brovanna and yupelri nebs  Off abx   Acute pulmonary edema. Positive cumulative fluid balance  - follow up UOP - lasix to maintain wuvolemia   Acute metabolic encephalopathy from hypoxia and multifactorial reasons below Hx of polysubstance abuse, ETOH, depression, bipolar. - continue valproic acid - overall much improved   Hypokalemia - replete as needed   Anemia of critical illness and chronic disease. - conservative transfusion threshold   Hx of HTN, [name], Chronic diastolic CHF. - diuresis as needed   Best practice:  Diet: NPO DVT prophylaxis: Lovenox GI prophylaxis: Protonix Mobility: bed rest Code Status: full code Disposition: ICU   Labs    CMP Latest Ref Rng & Units 10/06/2020 10/06/2020 10/05/2020  Glucose 70 - 99 mg/dL 10/07/2020) 500(X) 98  BUN 6 - 20 mg/dL 15 18 381(W)  Creatinine 0.61 - 1.24 mg/dL 29(H) 3.71(I) 9.67(E)  Sodium 135 - 145 mmol/L 130(L) 130(L) 142  Potassium 3.5 - 5.1 mmol/L 3.2(L) 3.4(L) 3.3(L)  Chloride 98 - 111 mmol/L 85(L) 86(L) 90(L)  CO2 22 - 32 mmol/L 36(H) 37(H) 42(H)  Calcium 8.9 - 10.3 mg/dL 7.9(L) 8.2(L) 8.6(L)  Total Protein 6.5 - 8.1 g/dL 4.8(L) - -  Total Bilirubin 0.3 - 1.2 mg/dL 0.8 - -  Alkaline Phos 38 - 126 U/L 47 - -  AST 15 - 41 U/L 42(H) - -  ALT 0 - 44 U/L 36 - -    CBC Latest Ref Rng & Units 10/06/2020 10/06/2020 10/05/2020   WBC 4.0 - 10.5 K/uL 6.4 6.9 7.6  Hemoglobin 13.0 - 17.0 g/dL 7.3(A) 8.9(L) 9.7(L)  Hematocrit 39 - 52 % 28.0(L) 27.7(L) 31.3(L)  Platelets 150 - 400 K/uL 271 266 310    ABG    Component Value Date/Time   PHART 7.387 10/04/2020 0844   PCO2ART 68.3 (HH) 10/04/2020 0844   PO2ART 90.2 10/04/2020 0844   HCO3 36.8 (H) 10/04/2020 0844   TCO2 34 (H) 12/21/2018 0357   ACIDBASEDEF 0.6 02/17/2019 1850   O2SAT 96.9 10/04/2020 0844    CBG (last 3)  Recent Labs    10/06/20 1102 10/06/20 1531 10/06/20 2002  GLUCAP 87 103* 78    Josephine Igo, DO [facility] 10/07/2020 7:05 AM

## 2020-10-07 NOTE — Progress Notes (Signed)
Occupational Therapy Evaluation Patient Details Name: Jeffrey [name] MRN: [id number] DOB: [birth year] Today's Date: 10/07/2020    History of Present Illness Pt is a 59 y/o M presenting to the ED on 11/7 for SOB. Pt was intubated on 11/7 and extubated on the 8th. 11/13 was transferred to [facility] MICU. PMH includes COPD, anemia, anxiety, depression, arthritis, bipolar disorder, CHF, HTN, [name] and hyperlipidemia.   Clinical Impression   PTA pt lives with his ex-wife and is independent with ADL and mobility. At baseline, pt becomes SOB walking from one end of the trailer to the other. Pt primarily stays at home to reduce risk of  Covid exposure. Pt has 24/7 A after DC. Confirmed level of assistance after DC with daughter (via phone), who appears very supportive and has been staying with her Dad at night. Daughter reports that her Dad has difficulty keeping his nasal cannula in at night (falls out) and his SpO2 often drops. She is wanting to know if there is another option other than the nasal cannula at night. Pt confused at times during eval, but pleasant and cooperative. Currently requires min A with ADL and limited mobility. VSS during session with max HR 102; BP 154/77 and SpO2 93 on 3L with 1 extension. Pt with 2/4 DOE with minimal activity. Hope for pt to continue to progress to DC home with 24/7 A and HHOT - discussed with daughter. Will follow acutely to facilitate safe DC home. Pt appreciative.    Follow Up Recommendations  Home health OT;Supervision/Assistance - 24 hour    Equipment Recommendations  3 in 1 bedside commode    Recommendations for Other Services PT consult     Precautions / Restrictions Precautions Precautions: Fall Restrictions Weight Bearing Restrictions: No      Mobility Bed Mobility Overal bed mobility: Needs Assistance Bed Mobility: Supine to Sit;Sit to Supine     Supine to sit: Supervision Sit to supine: Supervision   General bed mobility comments: heavy use  of bed rail    Transfers Overall transfer level: Needs assistance Equipment used: 1 person hand held assist Transfers: Sit to/from Stand Sit to Stand: Min assist              Balance Overall balance assessment: Needs assistance   Sitting balance-Leahy Scale: Good       Standing balance-Leahy Scale: Poor Standing balance comment: requires support at this time                           ADL either performed or assessed with clinical judgement   ADL Overall ADL's : Needs assistance/impaired     Grooming: Set up;Sitting   Upper Body Bathing: Set up;Sitting   Lower Body Bathing: Minimal assistance;Sit to/from stand   Upper Body Dressing : Set up;Sitting   Lower Body Dressing: Minimal assistance;Sit to/from stand   Toilet Transfer: Minimal assistance Toilet Transfer Details (indicate cue type and reason): simulated   Toileting - Clothing Manipulation Details (indicate cue type and reason): Max A - frequent watery stools; foley     Functional mobility during ADLs: Minimal assistance;Cueing for safety General ADL Comments: easily fatigues     Vision         Perception     Praxis      Pertinent Vitals/Pain Pain Assessment: Faces Faces Pain Scale: Hurts little more Pain Location: bottom Pain Descriptors / Indicators: Discomfort;Grimacing Pain Intervention(s): Limited activity within patient's tolerance     Hand  Dominance Right   Extremity/Trunk Assessment Upper Extremity Assessment Upper Extremity Assessment: Generalized weakness   Lower Extremity Assessment Lower Extremity Assessment: Defer to PT evaluation   Cervical / Trunk Assessment Cervical / Trunk Assessment: Normal   Communication Communication Communication: No difficulties   Cognition Arousal/Alertness: Awake/alert Behavior During Therapy: Flat affect Overall Cognitive Status: Impaired/Different from baseline Area of Impairment: Orientation;Attention;Memory;Following  commands;Safety/judgement;Awareness;Problem solving                 Orientation Level: Disoriented to;Time Current Attention Level: Sustained Memory: Decreased short-term memory Following Commands: Follows one step commands with increased time Safety/Judgement: Decreased awareness of safety;Decreased awareness of deficits Awareness: Intellectual Problem Solving: Slow processing;Decreased initiation;Difficulty sequencing;Requires verbal cues     General Comments       Exercises Exercises: Other exercises Other Exercises Other Exercises: encouraged BUE AROM through full ROM while seated in bed   Shoulder Instructions      Home Living Family/patient expects to be discharged to:: Private residence Living Arrangements: Spouse/significant other (ex-wife - Jeffrey [name] -lives with her) Available Help at Discharge: Family;Available 24 hours/day Type of Home: House (double wide) Home Access: Stairs to enter [employer] of Steps: 2 Entrance Stairs-Rails: Left Home Layout: One level     Bathroom Shower/Tub: Chief Strategy Officer: Handicapped height Bathroom Accessibility: Yes How Accessible: Accessible via walker Home Equipment: Walker - 2 wheels;Cane - single point          Prior Functioning/Environment Level of Independence: Independent        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; does not drive; assistance with medication management - comes pre-packaged; not out in community much bc of Covid; SOB with ambulating from 1 end of trailer to the other - stays in a bedroom opposite end of trailer from his ex-wife; dtr has been staying at night and has concerns of O2 sats dropping at night when her Dad sleeps because the [place] "falls out of his nose",leading to increased confusion  - dtr asking about use of a CPAP         OT Problem List: Decreased strength;Decreased activity tolerance;Impaired balance (sitting and/or standing);Decreased cognition;Decreased safety  awareness;Decreased knowledge of use of DME or AE;Cardiopulmonary status limiting activity;Pain      OT Treatment/Interventions: Self-care/ADL training;Therapeutic exercise;Neuromuscular education;Energy conservation;DME and/or AE instruction;Therapeutic activities;Cognitive remediation/compensation;Patient/family education;Balance training    OT Goals(Current goals can be found in the care plan section) Acute Rehab OT Goals Patient Stated Goal: to get better adn go home OT Goal Formulation: With patient/family Time For Goal Achievement: 10/21/20 Potential to Achieve Goals: Good  OT Frequency: Min 2X/week   Barriers to D/C:            Co-evaluation              AM-PAC OT "6 Clicks" Daily Activity     Outcome Measure Help from another person eating meals?: None Help from another person taking care of personal grooming?: A Little Help from another person toileting, which includes using toliet, bedpan, or urinal?: A Lot Help from another person bathing (including washing, rinsing, drying)?: A Little Help from another person to put on and taking off regular upper body clothing?: A Little Help from another person to put on and taking off regular lower body clothing?: A Little 6 Click Score: 18   End of Session Equipment Utilized During Treatment: Gait belt;Oxygen (3L; 1 extension) Nurse Communication: Mobility status;Other (comment) (DC recomendations)  Activity Tolerance: Patient tolerated treatment well Patient left: in bed;with call  bell/phone within reach;with bed alarm set;with SCD's reapplied (chair position)  OT Visit Diagnosis: Unsteadiness on feet (R26.81);Other abnormalities of gait and mobility (R26.89);Muscle weakness (generalized) (M62.81);Other symptoms and signs involving cognitive function;Pain Pain - part of body:  (bottom)                Time: 9449-6759 OT Time Calculation (min): 35 min Charges:  OT General Charges $OT Visit: 1 Visit OT Evaluation $OT Eval  Moderate Complexity: 1 Mod OT Treatments $Self Care/Home Management : 8-22 mins  Luisa Dago, OT/L   Acute OT Clinical Specialist Acute Rehabilitation Services Pager [phone] Office [phone]   [facility] 10/07/2020, 10:04 AM

## 2020-10-08 DIAGNOSIS — J9602 Acute respiratory failure with hypercapnia: Secondary | ICD-10-CM | POA: Diagnosis not present

## 2020-10-08 DIAGNOSIS — J9622 Acute and chronic respiratory failure with hypercapnia: Secondary | ICD-10-CM

## 2020-10-08 DIAGNOSIS — J9621 Acute and chronic respiratory failure with hypoxia: Secondary | ICD-10-CM

## 2020-10-08 DIAGNOSIS — J441 Chronic obstructive pulmonary disease with (acute) exacerbation: Secondary | ICD-10-CM | POA: Diagnosis not present

## 2020-10-08 LAB — BASIC METABOLIC PANEL
Anion gap: 9 (ref 5–15)
BUN: 10 mg/dL (ref 6–20)
CO2: 28 mmol/L (ref 22–32)
Calcium: 8.5 mg/dL — ABNORMAL LOW (ref 8.9–10.3)
Chloride: 98 mmol/L (ref 98–111)
Creatinine, Ser: 0.54 mg/dL — ABNORMAL LOW (ref 0.61–1.24)
GFR, Estimated: >60 mL/min (ref >60)
Glucose, Bld: 131 mg/dL — ABNORMAL HIGH (ref 70–99)
Potassium: 3.6 mmol/L (ref 3.5–5.1)
Sodium: 135 mmol/L (ref 135–145)

## 2020-10-08 LAB — GLUCOSE, CAPILLARY
Glucose-Capillary: 72 mg/dL (ref 70–99)
Glucose-Capillary: 78 mg/dL (ref 70–99)
Glucose-Capillary: 96 mg/dL (ref 70–99)
Glucose-Capillary: 107 mg/dL — ABNORMAL HIGH (ref 70–99)
Glucose-Capillary: 85 mg/dL (ref 70–99)
Glucose-Capillary: 78 mg/dL (ref 70–99)
Glucose-Capillary: 79 mg/dL (ref 70–99)

## 2020-10-08 LAB — CBC
HCT: 31.9 % — ABNORMAL LOW (ref 39.0–52.0)
Hemoglobin: 10.4 g/dL — ABNORMAL LOW (ref 13.0–17.0)
MCH: 27.7 pg (ref 26.0–34.0)
MCHC: 32.6 g/dL (ref 30.0–36.0)
MCV: 84.8 fL (ref 80.0–100.0)
Platelets: 322 10*3/uL (ref 150–400)
RBC: 3.76 MIL/uL — ABNORMAL LOW (ref 4.22–5.81)
RDW: 13.2 % (ref 11.5–15.5)
WBC: 7 10*3/uL (ref 4.0–10.5)
nRBC: 0 % (ref 0.0–0.2)

## 2020-10-08 MED ORDER — PREDNISONE 20 MG PO TABS: 20.0000 mg | ORAL_TABLET | Freq: Every day | ORAL | Status: DC

## 2020-10-08 MED ORDER — PREDNISONE 20 MG PO TABS
30.0000 mg | ORAL_TABLET | Freq: Every day | ORAL | Status: DC
  Filled 2020-10-08: qty 1

## 2020-10-08 MED ORDER — PREDNISONE 10 MG PO TABS: 10.0000 mg | ORAL_TABLET | Freq: Every day | ORAL | Status: DC

## 2020-10-08 MED ORDER — PREDNISONE 20 MG PO TABS
40.0000 mg | ORAL_TABLET | Freq: Every day | ORAL | Status: AC
  Administered 2020-10-09: 40 mg via ORAL
  Filled 2020-10-08: qty 2

## 2020-10-08 MED ORDER — ENSURE ENLIVE PO LIQD
237.0000 mL | Freq: Three times a day (TID) | ORAL | Status: DC
  Administered 2020-10-08: 237 mL via ORAL

## 2020-10-08 MED ORDER — ADULT MULTIVITAMIN W/MINERALS CH
1.0000 | ORAL_TABLET | Freq: Every day | ORAL | Status: DC
  Administered 2020-10-08 – 2020-10-09 (×2): 1 via ORAL
  Filled 2020-10-08 (×2): qty 1

## 2020-10-08 NOTE — Progress Notes (Signed)
Patient continues to exhibit signs of hypercapnia associated with chronic respiratory failure secondary to severe COPD. Interruption or failure to provide NIV would quickly lead to exacerbation of the patient's condition, hospital admission, and likely harm to the patient. Continued use is preferred. The use of the NIV will treat patient's high PC02 levels and can reduce risk of exacerbations and future hospitalizations when used at night and during the day. BiLevel/RAD has been considered and ruled out as patient requires continuous alarms, backup battery, and portability which are not possible with BiLevel/RAD devices. Ventilation is required to decrease the work of breathing and improve pulmonary status. Interruption of ventilator support would lead to decline of health status. Patient is able to protect their airways and clear secretions on their own.    

## 2020-10-08 NOTE — TOC Initial Note (Signed)
Transition of Care [facility]) - Initial/Assessment Note    Patient Details  Name: Jeffrey [name] MRN: [id number] Date of Birth: [birth year]  [facility]) CM/SW Contact:    Bess Kinds, RN Phone Number: [phone] 10/08/2020, 2:58 PM  Clinical Narrative:                   Spoke with patient at the bedside this morning. He stated that he is home with his wife, [name]. Clarified that that she is his ex-wife. Discussed transition planning. Patient is agreeable to [facility] services. He stated that he does have oxygen at home. Patient unable to provide details about his DME, but he consented to [facility] speaking with [name] and with [employer].   Spoke with [name] this afternoon at bedside. Patient will return home with [name]. Verified address in Epic. Discussed HH recommendations for PT and OT plus 24/7 care. Patient has used [facility] in the past. Referral is pending.   DME at home includes RW and oxygen. Patient would benefit from lightweight manual wheelchair and 3N1 bedside commode.   [name] asked about patient getting a cpap at home. Discussed needing a sleep study which is done outpatient. Discussed possibility that patient may qualify for NIV. Spoke with Ian Malkin at [facility] who found that patient had qualified and received NIV while at [facility] 03/22/2019. Through further discussions patient would not use and device was returned within a couple monthls. Ian Malkin was able to confirm that device had been picked up. Daughter and patient asking to try again with NIV.   Spoke with [name] on the phone to confirm that she is available for patient 24/7. Discussed HH recommendations for therapy. She is in agreement.   TOC following for transition needs.   Expected Discharge Plan: Home w Home Health Services Barriers to Discharge: Continued Medical Work up   Patient Goals and CMS Choice Patient states their goals for this hospitalization and ongoing recovery are:: (P) Rehab with SNF CMS Medicare.gov  Compare Post Acute Care list provided to:: Patient Choice offered to / list presented to : Patient, Adult Children (daughter, [name])  Expected Discharge Plan and Services Expected Discharge Plan: Home w Home Health Services In-house Referral: (P) [facility] Discharge Planning Services: CM Consult Post Acute Care Choice: Durable Medical Equipment, Home Health Living arrangements for the past 2 months: Single Family Home                 DME Arranged: 3-N-1, NIV, Wheelchair manual DME Agency: [facility] Date DME Agency Contacted: 10/08/20 Time DME Agency Contacted: [id number] Representative spoke with at DME Agency: Ian Malkin HH Arranged: PT, OT   Date HH Agency Contacted: (P) 11/21/20      Prior Living Arrangements/Services Living arrangements for the past 2 months: Single Family Home Lives with:: Self, Friends Patient language and need for interpreter reviewed:: Yes Do you feel safe going back to the place where you live?: Yes      Need for Family Participation in Patient Care: Yes (Comment) Care giver support system in place?: Yes (comment) Current home services: DME (has home oxygen from [place] and a walker) Criminal Activity/Legal Involvement Pertinent to Current Situation/Hospitalization: No - Comment as needed  Activities of Daily Living Home Assistive Devices/Equipment: Oxygen ADL Screening (condition at time of admission) Patient's cognitive ability adequate to safely complete daily activities?: Yes Is the patient deaf or have difficulty hearing?: No Does the patient have difficulty seeing, even when wearing glasses/contacts?: No Does the patient have difficulty  concentrating, remembering, or making decisions?: No Patient able to express need for assistance with ADLs?: Yes Does the patient have difficulty dressing or bathing?: Yes Independently performs ADLs?: Yes (appropriate for developmental age) Does the patient have difficulty walking or climbing stairs?:  Yes Weakness of Legs: Both Weakness of Arms/Hands: None  Permission Sought/Granted Permission sought to share information with : Family Supports Permission granted to share information with : Yes, Verbal Permission Granted  Share Information with NAME: [name]  Permission granted to share info w AGENCY: SNF and HH  Permission granted to share info w Relationship: Daughter  Permission granted to share info w Contact Information: [phone]  Emotional Assessment Appearance:: Appears stated age   Affect (typically observed): Accepting Orientation: : Oriented to Self, Oriented to Place, Oriented to  Time, Oriented to Situation Alcohol / Substance Use: Not Applicable Psych Involvement: No (comment)  Admission diagnosis:  Dyspnea [R06.00] COPD exacerbation (HCC) [J44.1] Acute respiratory failure with hypercapnia (HCC) [J96.02] Patient Active Problem List   Diagnosis Date Noted  . Pressure injury of skin 10/05/2020  . DNR (do not resuscitate) discussion   . Acute respiratory failure with hypercapnia (HCC) 09/29/2020  . Lactic acidosis 09/29/2020  . Hyponatremia 09/29/2020  . Hyperglycemia 09/29/2020  . Elevated brain natriuretic peptide (BNP) level 09/29/2020  . (HFpEF) heart failure with preserved ejection fraction (HCC) 09/29/2020  . Rhinitis, chronic 08/01/2020  . Chronic respiratory failure with hypoxia and hypercapnia (HCC) 03/30/2020  . Acute on chronic diastolic CHF (congestive heart failure) (HCC) 12/26/2019  . Aspiration pneumonitis (HCC) 12/26/2019  . Acute on chronic respiratory failure with hypoxia and hypercapnia (HCC) 12/25/2019  . Lobar pneumonia (HCC) 12/25/2019  . Acute metabolic encephalopathy 12/25/2019  . Acute on chronic respiratory failure with hypercapnia (HCC) 12/24/2019  . COPD with acute exacerbation (HCC) 10/12/2019  . Respiratory failure with hypoxia (HCC) 04/30/2019  . Acute on chronic respiratory failure with hypoxia (HCC) 04/30/2019  .  Bronchiectasis (HCC) 03/24/2019  . Fever 03/24/2019  . Cough   . Dysphagia   . Palliative care by specialist   . Goals of care, counseling/discussion   . Hepatic encephalopathy (HCC)   . COPD exacerbation (HCC) 09/16/2018  . Essential hypertension 09/16/2018  . GERD (gastroesophageal reflux disease) 09/16/2018  . Hypoxia   . Chronic diastolic CHF (congestive heart failure) (HCC)   . Bipolar disorder with severe depression (HCC) 02/01/2018  . Alcohol use disorder, severe, dependence (HCC) 02/01/2018  . Cannabis use disorder, severe, dependence (HCC) 02/01/2018  . MDD (major depressive disorder), recurrent episode, severe (HCC) 01/31/2018  . Internal and external bleeding hemorrhoids   . Rectal bleeding   . Iron deficiency anemia due to chronic blood loss 03/09/2017  . Anemia   . GIB (gastrointestinal bleeding) 02/07/2017  . Peptic ulcer disease   . Bipolar disorder (HCC)   . 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 H/O: depression 08/26/2016  . Dyslipidemia 08/26/2016  . Cigarette smoker 08/26/2016  . [name] (peripheral vascular disease) (HCC) 06/18/2015  . Chronic diarrhea 05/02/2013  . Hematochezia 05/02/2013  . Abnormal weight loss 05/02/2013  . Abdominal pain, periumbilical 05/02/2013   PCP:  Gareth Morgan, MD Pharmacy:   [facility] [place] [place] - [address] [place] [address] Phone: [phone] Fax: [phone]     Social Determinants of Health (SDOH) Interventions    Readmission Risk Interventions Readmission Risk Prevention Plan 09/30/2020 05/01/2019 03/30/2019  Transportation Screening Complete Complete -  Medication Review (  RN Care Manager) Complete - -  PCP or Specialist appointment within 3-5 days of discharge - - Not Complete  PCP/Specialist Appt Not Complete comments - - recommended for 1 week f/u, PCP closed today, patient to call tomorrow and make appt.   HRI or Home Care Consult Complete Complete -  SW Recovery  Care/Counseling Consult Complete Complete -  Palliative Care Screening Not Applicable Not Applicable -  Skilled Nursing Facility - Not Applicable -  Some recent data might be hidden

## 2020-10-08 NOTE — Progress Notes (Addendum)
PROGRESS NOTE    Jeffrey [name]  RXV:[id number] DOB: 1961-02-29 DOA: 09/29/2020 PCP: Lemmie Evens, MD  Brief Narrative: 59 yo male former smoker with hx of COPD on 3 to 4 liters oxygen at home, depression, bipolar disorder, chronic diastolic CHF, hepatic encephalopathy, polysubstance abuse was brought to ER in respiratory distress, admitted with acute hypoxic and hypercarbic respiratory failure requiring intubation at [facility] he was then extubated following which he required BiPAP.  Hospital course complicated by metabolic encephalopathy requiring Precedex, as needed Ativan and fentanyl -Subsequently transferred from [facility] to [facility], also seen by palliative team, family wish to continue aggressive care -Transferred from [facility] to [facility] service  11/15 -Confusion slowly improving   Assessment & Plan:   Acute on chronic hypoxic and hypercarbic respiratory failure COPD exacerbation -Clinically improving, required mechanical ventilation followed by BiPAP this admission -Steroids cut down, now on Solu-Medrol 40 mg daily, transitioned to prednisone taper -Continue BiPAP nightly and as needed -Continue nebs, completed antibiotic course -Case management consult for trilogy/NIPPV -Transfer out of ICU, PT OT  Pulmonary edema Acute on chronic diastolic CHF -Diuresed with IV Lasix this admission, clinically appears euvolemic now -Assess volume status daily, add low-dose diuretic at discharge  Acute metabolic encephalopathy -Likely multifactorial, hypoxia, ICU delirium, steroids, withdrawal from benzos and or other psychotropic meds -Required Precedex this admission, now off -Restarted home dose of valproic acid and Prozac  -Restarted Zyprexa at a lower dose -Mental status slowly improving  Hypokalemia -Repleted  Anemia of critical illness and chronic disease -Stable  Bipolar disorder Depression -Resuming some home meds as listed above  Palliative care encounter -Seen  by palliative team this admission at [facility], status post family meeting, family wishes for full code and full scope of treatment  DVT prophylaxis: Lovenox Code Status: Full code Family Communication: Discussed with patient in detail, no family at bedside Disposition Plan:  Status is: Inpatient  Remains inpatient appropriate because:Inpatient level of care appropriate due to severity of illness   Dispo:  Patient From: Home  Planned Disposition: Home  Expected discharge date: 1-2days  Medically stable for discharge: No    Consultants:   PCCM transfer   Procedures:   Antimicrobials:    Subjective: -Confusion improving, breathing better overall  Objective: Vitals:   10/08/20 1131 10/08/20 1200 10/08/20 1300 10/08/20 1400  BP:  (!) 159/81  119/85  Pulse:  84 (!) 148 (!) 105  Resp:  15 17 19   Temp: 99.8 F (37.7 C)     TempSrc: Oral     SpO2:  100% 92% 97%  Weight:      Height:        Intake/Output Summary (Last 24 hours) at 10/08/2020 1507 Last data filed at 10/08/2020 1400 Gross per 24 hour  Intake 513.26 ml  Output 875 ml  Net -361.74 ml   Filed Weights   10/06/20 0500 10/07/20 0451 10/08/20 0500  Weight: 85.2 kg 79 kg 76.9 kg    Examination:  General exam: Chronically ill male appears older than stated age, awake alert oriented to self and partly to place only, intermittent confusion CVS: S1-S2, regular rate rhythm Lungs: Distant breath sounds, no wheezes Abdomen: Soft, nontender, bowel sounds present Extremities: No edema Psychiatry: Poor insight and judgment    Data Reviewed:   CBC: Recent Labs  Lab 10/05/20 0352 10/06/20 0209 10/06/20 0436 10/07/20 0815 10/08/20 0430  WBC 7.6 6.9 6.4 6.8 7.0  NEUTROABS  --  5.3 4.9  --   --  HGB 9.7* 8.9* 8.8* 9.7* 10.4*  HCT 31.3* 27.7* 28.0* 29.6* 31.9*  MCV 88.2 88.2 87.8 85.1 84.8  PLT 310 266 271 332 782   Basic Metabolic Panel: Recent Labs  Lab 10/02/20 0554 10/02/20 0554  10/03/20 0534 10/03/20 0534 10/04/20 0400 10/04/20 0400 10/05/20 0352 10/06/20 0209 10/06/20 0436 10/07/20 0815 10/08/20 0430  NA 137   < > 142   < > 146*   < > 142 130* 130* 132* 135  K 3.7   < > 4.2   < > 3.9   < > 3.3* 3.4* 3.2* 2.8* 3.6  CL 98   < > 100   < > 97*   < > 90* 86* 85* 92* 98  CO2 32   < > 35*   < > 39*   < > 42* 37* 36* 31 28  GLUCOSE 105*   < > 84   < > 95   < > 98 363* 387* 252* 131*  BUN 22*   < > 21*   < > 25*   < > 23* 18 15 11 10   CREATININE 0.50*   < > 0.54*   < > 0.49*   < > 0.51* 0.58* 0.59* 0.58* 0.54*  CALCIUM 8.3*   < > 8.4*   < > 8.9   < > 8.6* 8.2* 7.9* 7.6* 8.5*  MG 2.2   < > 2.2  --  2.1  --  2.0 1.8 1.7  --   --   PHOS 3.0  --   --   --   --   --   --   --   --   --   --    < > = values in this interval not displayed.   GFR: Estimated Creatinine Clearance: 101.4 mL/min (A) (by C-G formula based on SCr of 0.54 mg/dL (L)). Liver Function Tests: Recent Labs  Lab 10/03/20 0534 10/04/20 0400 10/06/20 0436 10/07/20 0815  AST 19 29 42* 33  ALT 17 23 36 35  ALKPHOS 48 54 47 56  BILITOT 0.3 0.6 0.8 0.8  PROT 5.6* 5.6* 4.8* 5.1*  ALBUMIN 2.5* 2.5* 2.2* 2.4*   No results for input(s): LIPASE, AMYLASE in the last 168 hours. Recent Labs  Lab 10/07/20 0815  AMMONIA 29   Coagulation Profile: No results for input(s): INR, PROTIME in the last 168 hours. Cardiac Enzymes: No results for input(s): CKTOTAL, CKMB, CKMBINDEX, TROPONINI in the last 168 hours. BNP (last 3 results) No results for input(s): PROBNP in the last 8760 hours. HbA1C: No results for input(s): HGBA1C in the last 72 hours. CBG: Recent Labs  Lab 10/07/20 1932 10/07/20 2343 10/08/20 0420 10/08/20 0731 10/08/20 1055  GLUCAP 101* 85 72 78 96   Lipid Profile: No results for input(s): CHOL, HDL, LDLCALC, TRIG, CHOLHDL, LDLDIRECT in the last 72 hours. Thyroid Function Tests: No results for input(s): TSH, T4TOTAL, FREET4, T3FREE, THYROIDAB in the last 72 hours. Anemia  Panel: No results for input(s): VITAMINB12, FOLATE, FERRITIN, TIBC, IRON, RETICCTPCT in the last 72 hours. Urine analysis:    Component Value Date/Time   COLORURINE YELLOW 09/29/2020 0340   APPEARANCEUR CLEAR 09/29/2020 0340   LABSPEC 1.009 09/29/2020 0340   PHURINE 7.0 09/29/2020 0340   GLUCOSEU NEGATIVE 09/29/2020 0340   HGBUR MODERATE (A) 09/29/2020 0340   BILIRUBINUR NEGATIVE 09/29/2020 0340   KETONESUR NEGATIVE 09/29/2020 0340   PROTEINUR NEGATIVE 09/29/2020 0340   UROBILINOGEN 0.2 03/01/2011 1427   NITRITE NEGATIVE 09/29/2020  [place] 09/29/2020 0340   Sepsis Labs: @LABRCNTIP (procalcitonin:4,lacticidven:4)  ) Recent Results (from the past 240 hour(s))  Respiratory Panel by RT PCR (Flu A&B, Covid) - Nasopharyngeal Swab     Status: None   Collection Time: 09/29/20  2:20 AM   Specimen: Nasopharyngeal Swab  Result Value Ref Range Status   SARS Coronavirus 2 by RT PCR NEGATIVE NEGATIVE Final    Comment: (NOTE) SARS-CoV-2 target nucleic acids are NOT DETECTED.  The SARS-CoV-2 RNA is generally detectable in upper respiratoy specimens during the acute phase of infection. The lowest concentration of SARS-CoV-2 viral copies this assay can detect is 131 copies/mL. A negative result does not preclude SARS-Cov-2 infection and should not be used as the sole basis for treatment or other patient management decisions. A negative result may occur with  improper specimen collection/handling, submission of specimen other than nasopharyngeal swab, presence of viral mutation(s) within the areas targeted by this assay, and inadequate number of viral copies (<131 copies/mL). A negative result must be combined with clinical observations, patient history, and epidemiological information. The expected result is Negative.  Fact Sheet for Patients:  PinkCheek.be  Fact Sheet for Healthcare Providers:   GravelBags.it  This test is no t yet approved or cleared by the [place] FDA and  has been authorized for detection and/or diagnosis of SARS-CoV-2 by FDA under an Emergency Use Authorization (EUA). This EUA will remain  in effect (meaning this test can be used) for the duration of the COVID-19 declaration under Section 564(b)(1) of the Act, 21 U.S.C. section 360bbb-3(b)(1), unless the authorization is terminated or revoked sooner.     Influenza A by PCR NEGATIVE NEGATIVE Final   Influenza B by PCR NEGATIVE NEGATIVE Final    Comment: (NOTE) The Xpert Xpress SARS-CoV-2/FLU/RSV assay is intended as an aid in  the diagnosis of influenza from Nasopharyngeal swab specimens and  should not be used as a sole basis for treatment. Nasal washings and  aspirates are unacceptable for Xpert Xpress SARS-CoV-2/FLU/RSV  testing.  Fact Sheet for Patients: PinkCheek.be  Fact Sheet for Healthcare Providers: GravelBags.it  This test is not yet approved or cleared by the [place] FDA and  has been authorized for detection and/or diagnosis of SARS-CoV-2 by  FDA under an Emergency Use Authorization (EUA). This EUA will remain  in effect (meaning this test can be used) for the duration of the  Covid-19 declaration under Section 564(b)(1) of the Act, 21  U.S.C. section 360bbb-3(b)(1), unless the authorization is  terminated or revoked. Performed at [facility], [address] [place], [place] [address]   Blood culture (routine x 2)     Status: None   Collection Time: 09/29/20  3:25 AM   Specimen: BLOOD LEFT HAND  Result Value Ref Range Status   Specimen Description BLOOD LEFT HAND  Final   Special Requests   Final    BOTTLES DRAWN AEROBIC AND ANAEROBIC Blood Culture adequate volume   Culture   Final    NO GROWTH 6 DAYS Performed at [facility], [address], [place],  [address]    Report  Status 10/05/2020 FINAL  Final  Blood culture (routine x 2)     Status: None   Collection Time: 09/29/20  3:26 AM   Specimen: BLOOD RIGHT HAND  Result Value Ref Range Status   Specimen Description BLOOD RIGHT HAND  Final   Special Requests   Final    BOTTLES DRAWN AEROBIC AND ANAEROBIC Blood Culture  adequate volume   Culture   Final    NO GROWTH 6 DAYS Performed at [facility], [address] [place], [place] [address]    Report Status 10/05/2020 FINAL  Final  MRSA PCR Screening     Status: None   Collection Time: 09/29/20  1:42 PM   Specimen: Nasal Mucosa; Nasopharyngeal  Result Value Ref Range Status   MRSA by PCR NEGATIVE NEGATIVE Final    Comment:        The GeneXpert MRSA Assay (FDA approved for NASAL specimens only), is one component of a comprehensive MRSA colonization surveillance program. It is not intended to diagnose MRSA infection nor to guide or monitor treatment for MRSA infections. Performed at [facility], [address], [place], [place] [address]          Radiology Studies: No results found.      Scheduled Meds: . arformoterol  15 mcg Nebulization BID  . budesonide (PULMICORT) nebulizer solution  0.5 mg Nebulization BID  . chlorhexidine  15 mL Mouth Rinse BID  . chlorhexidine gluconate (MEDLINE KIT)  15 mL Mouth Rinse BID  . Chlorhexidine Gluconate Cloth  6 each Topical Q0600  . diltiazem  180 mg Oral Daily  . divalproex  1,500 mg Oral QHS  . enoxaparin (LOVENOX) injection  40 mg Subcutaneous Q24H  . FLUoxetine  20 mg Oral Daily  . mouth rinse  15 mL Mouth Rinse q12n4p  . OLANZapine  2.5 mg Oral QHS  . [START ON 10/09/2020] predniSONE  40 mg Oral Q breakfast   Followed by  . [START ON 10/10/2020] predniSONE  30 mg Oral Q breakfast   Followed by  . [START ON 10/12/2020] predniSONE  20 mg Oral Q breakfast   Followed by  . [START ON 10/14/2020] predniSONE  10 mg Oral Q breakfast  . revefenacin  175 mcg Nebulization Daily  . sodium  chloride flush  10-40 mL Intracatheter Q12H   Continuous Infusions:   LOS: 9 days    Time spent: 33mn    PDomenic Polite MD [facility] 10/08/2020, 3:07 PM

## 2020-10-08 NOTE — Progress Notes (Signed)
Nutrition Follow-up  DOCUMENTATION CODES:   Not applicable  INTERVENTION:   Ensure Enlive po TID, each supplement provides 350 kcal and 20 grams of protein  Magic cup BID with meals, each supplement provides 290 kcal and 9 grams of protein  Continue Regular diet; family to bring in food for patient as able to promote po intake  Feeding assistance and encouragement at meal times  Add MVI with minerals daily   NUTRITION DIAGNOSIS:   Inadequate oral intake related to inability to eat as evidenced by NPO status.  Being addressed via supplements  GOAL:   Patient will meet greater than or equal to 90% of their needs  Progressing  MONITOR:   PO intake, Supplement acceptance, Labs, Weight trends  REASON FOR ASSESSMENT:   Consult, Ventilator Enteral/tube feeding initiation and management  ASSESSMENT:   History of COPD, CHF, HTN, [name], GERD who presents with respiratory distress and AMS.  11/07 Admitted, Intubated 11/08 Extubated to BiPap 11/10 Started on precedex for AMS  Pt remains confused but improving, off precedex. Pt is alert.   Recorded po intake 10% of meals. Pt with poor po intake per RN; pt ate 1/4 chicken sandwich last night. Ate part of a biscuit and drank OJ this AM.   Current wt 77 kg; admit weight 85 kg.   Noted newly documented DTI to coccyx, stage I MDRPI to nose   Labs: reviewed Meds: reviewed   Diet Order:   Diet Order            Diet regular Room service appropriate? Yes with Assist; Fluid consistency: Thin  Diet effective now                 EDUCATION NEEDS:   Not appropriate for education at this time  Skin:  Skin Assessment: Skin Integrity Issues: Skin Integrity Issues:: Stage I, DTI DTI: coccyx Stage I: nose (MDRPI from BiPap)  Last BM:  11/15  Height:   Ht Readings from Last 1 Encounters:  10/05/20 5\' 10"  (1.778 m)    Weight:   Wt Readings from Last 1 Encounters:  10/08/20 76.9 kg    BMI:  Body mass index is  24.33 kg/m.  Estimated Nutritional Needs:   Kcal:  2000-2300 kcals  Protein:  100-120 g  Fluid:  >/= 2 L   10/10/20 MS, RDN, LDN, CNSC Registered Dietitian III Clinical Nutrition RD Pager and On-Call Pager Number Located in [place]

## 2020-10-08 NOTE — Progress Notes (Signed)
NAME:  Jeffrey [name], MRN:  [id number], DOB:  Mar 25, 1961, LOS: 9 ADMISSION DATE:  09/29/2020, CONSULTATION DATE: 09/30/20 REFERRING MD:  Dr. Maurilio Lovely, [facility], CHIEF COMPLAINT:  Respiratory failure   Brief History   59 yo male former smoker with hx of COPD on 3 to 4 liters oxygen at home brought to ER in respiratory distress with altered mental status.  SpO2 78% on room air.  He required intubation in the ER.  He is followed in pulmonary office by Dr. Sherene Sires.    Past Medical History  COPD, Bipolar, Diastolic CHF, Hepatic encephalopathy, Depression, Polysubstance abuse, [name], ETOH, GI bleed, HTN, GERD  Significant Hospital Events   11/07 Admit 11/08 extubated, needed Bipap after 11/09 altered mental status with ?seizure versus withdrawal 11/10 started on precedex; Palliative care consulted >> family wishes to continue aggressive therapies 11/11 arrange for transfer to [place] 11/13 admitted to [facility] MICU  Consults:  Palliative care Neurology  Procedures:  ETT 11/07 >> 11/10  Significant Diagnostic Tests:   PFT 10/09/16 >> 1.84 (49%), FEV1% 48, TLC 7.39 (105%), RV 3.59 (166%), DLCO 36%  A1AT 10/09/16 >> 215, MM  Echo 10/12/19 >> EF 65 to 70%, grade 1 DD, mild/mod LA dilation, mild MR, mild/mod AR  MRI brain 10/03/20 >> mild atrophy  EEG 10/03/20 >> generalized slowing  Micro Data:  COVID/Flu 09/29/20 >> negative MRSA PCR 09/29/20 >> negative Blood 09/29/20 >>   Antimicrobials:  Zithromax 11/06  Rocephin 11/06  Vancomycin 11/07  Zosyn 11/07 >> 11/09  Interim history/subjective:   Patient is feeling better, on 3 L oxygen at rest, with oxygen saturation 100%  Objective   Blood pressure (!) 160/81, pulse 80, temperature 98.9 F (37.2 C), temperature source Oral, resp. rate (!) 23, height 5\' 10"  (1.778 m), weight 76.9 kg, SpO2 100 %.    FiO2 (%):  [32 %] 32 %   Intake/Output Summary (Last 24 hours) at 10/08/2020 1048 Last data filed at 10/08/2020 0744 Gross per 24 hour   Intake 463.26 ml  Output 625 ml  Net -161.74 ml   Filed Weights   10/06/20 0500 10/07/20 0451 10/08/20 0500  Weight: 85.2 kg 79 kg 76.9 kg    Examination: General - Chronically ill appearing male, lying on the bed HEENT: Atraumatic, normocephalic, moist mucous membranes, anicteric Cardiac -regular rate and rhythm, no murmur or gallop Chest -clear to auscultation bilaterally, no wheezes Abdomen -soft nontender, nondistended bowel sounds present Extremities - no edema, decreased muscle mass  Neuro - alert to voice, moving all 4 extremities.  Nonfocal  Resolved Hospital Problem list   Lactic acidosis from hypoxia   Assessment & Plan:   Acute on chronic hypoxic/hypercapnic respiratory failure from COPD exacerbation. Patient is improving, now back to his home oxygen 3 to 4 L with a stable O2 sat Continue BIPAP QHS and PRN  Started taper steroid Continue pulmicort, brovanna, yupelri   Acute pulmonary edema. Positive cumulative fluid balance  Patient received 1 dose of Lasix yesterday, remains net -3 L Now he looks euvolemic  Acute metabolic encephalopathy from hypoxia and multifactorial reasons below Hx of polysubstance abuse, ETOH, depression, bipolar. Patient mental status is improved He is off Precedex  Hypokalemia Resolved  Anemia of critical illness and chronic disease. - follow cbc, conservative transfusion threshold   Hx of HTN, [name], Chronic diastolic CHF. - prn bb(-)  - diuresis to maintain euvolemia    Best practice:  Diet: Regular DVT prophylaxis: Lovenox GI prophylaxis: Protonix Mobility: bed rest  Code Status: full code Disposition: Transfer to MedSurg floor  Labs    CMP Latest Ref Rng & Units 10/08/2020 10/07/2020 10/06/2020  Glucose 70 - 99 mg/dL 410(V) 013(H) 438(O)  BUN 6 - 20 mg/dL 10 11 15   Creatinine 0.61 - 1.24 mg/dL ) 8.75(Z) 9.72(Q)  Sodium 135 - 145 mmol/L 135 132(L) 130(L)  Potassium 3.5 - 5.1 mmol/L 3.6 2.8(L) 3.2(L)   Chloride 98 - 111 mmol/L 98 92(L) 85(L)  CO2 22 - 32 mmol/L 28 31 36(H)  Calcium 8.9 - 10.3 mg/dL 2.06(O) 7.6(L) 7.9(L)  Total Protein 6.5 - 8.1 g/dL - 5.1(L) 4.8(L)  Total Bilirubin 0.3 - 1.2 mg/dL - 0.8 0.8  Alkaline Phos 38 - 126 U/L - 56 47  AST 15 - 41 U/L - 33 42(H)  ALT 0 - 44 U/L - 35 36    CBC Latest Ref Rng & Units 10/08/2020 10/07/2020 10/06/2020  WBC 4.0 - 10.5 K/uL 7.0 6.8 6.4  Hemoglobin 13.0 - 17.0 g/dL 10.4(L) 9.7(L) 8.8(L)  Hematocrit 39 - 52 % 31.9(L) 29.6(L) 28.0(L)  Platelets 150 - 400 K/uL 322 332 271    ABG    Component Value Date/Time   PHART 7.387 10/04/2020 0844   PCO2ART 68.3 (HH) 10/04/2020 0844   PO2ART 90.2 10/04/2020 0844   HCO3 36.8 (H) 10/04/2020 0844   TCO2 34 (H) 12/21/2018 0357   ACIDBASEDEF 0.6 02/17/2019 1850   O2SAT 96.9 10/04/2020 0844    CBG (last 3)  Recent Labs    10/07/20 2343 10/08/20 0420 10/08/20 0731  GLUCAP 85 72 78     10/10/20 MD Critical care physician [facility]  Pager: [phone] Mobile: [phone]

## 2020-10-08 NOTE — Progress Notes (Cosign Needed)
    Durable Medical Equipment  (From admission, onward)         Start     Ordered   10/08/20 1509  For home use only DME 3 n 1  Once        10/08/20 1509   10/08/20 1507  For home use only DME lightweight manual wheelchair with seat cushion  Once       Comments: Patient suffers from COPD which impairs their ability to perform daily activities like ADL's in the home.  A RW will not resolve  issue with performing activities of daily living. A wheelchair will allow patient to safely perform daily activities. Patient is not able to propel themselves in the home using a standard weight wheelchair due to weakness. Patient can self propel in the lightweight wheelchair. Length of need is lifetime. Accessories: elevating leg rests (ELRs), wheel locks, extensions and anti-tippers.   10/08/20 1508

## 2020-10-09 ENCOUNTER — Telehealth (HOSPITAL_COMMUNITY): Payer: Self-pay | Admitting: Psychiatry

## 2020-10-09 DIAGNOSIS — J9621 Acute and chronic respiratory failure with hypoxia: Secondary | ICD-10-CM | POA: Diagnosis not present

## 2020-10-09 DIAGNOSIS — I5030 Unspecified diastolic (congestive) heart failure: Secondary | ICD-10-CM | POA: Diagnosis not present

## 2020-10-09 DIAGNOSIS — Z7189 Other specified counseling: Secondary | ICD-10-CM | POA: Diagnosis not present

## 2020-10-09 DIAGNOSIS — J441 Chronic obstructive pulmonary disease with (acute) exacerbation: Secondary | ICD-10-CM | POA: Diagnosis not present

## 2020-10-09 LAB — GLUCOSE, CAPILLARY
Glucose-Capillary: 90 mg/dL (ref 70–99)
Glucose-Capillary: 87 mg/dL (ref 70–99)
Glucose-Capillary: 100 mg/dL — ABNORMAL HIGH (ref 70–99)
Glucose-Capillary: 109 mg/dL — ABNORMAL HIGH (ref 70–99)

## 2020-10-09 MED ORDER — PREDNISONE 10 MG PO TABS: ORAL_TABLET | ORAL | 0 refills | Status: AC

## 2020-10-09 MED ORDER — FLUOXETINE HCL 20 MG PO CAPS
20.0000 mg | ORAL_CAPSULE | Freq: Every day | ORAL | 2 refills | Status: DC
Start: 2020-10-10 — End: 2020-10-25

## 2020-10-09 NOTE — Progress Notes (Signed)
Pt picked up by ex wife [name] at [facility]. Taken down by wheelchair, assisted into car, transferred to home oxygen tank.

## 2020-10-09 NOTE — Progress Notes (Signed)
Hospitalist spoke w/ pt re: home Trilogy, which pt has refused in past. Pt now agreeable, although confused. MD requesting RN speak w/ CM to set this up.  Spoke w/ Ursula Alert, RNCM who states this has already been ordered and will be sent to patient's home.

## 2020-10-09 NOTE — Progress Notes (Signed)
Physical Therapy Treatment Patient Details Name: Jeffrey [name] MRN: [id number] DOB: [birth year] Today's Date: 10/09/2020    History of Present Illness Pt is a 59 y/o M presenting to the ED on 11/7 for SOB. Pt was intubated on 11/7 and extubated on the 8th. 11/13 was transferred to [facility] MICU. PMH includes COPD, anemia, anxiety, depression, arthritis, bipolar disorder, CHF, HTN, [name] and hyperlipidemia.    PT Comments    Pt very pleasantly confused and able to improve mobility and gait today. Pt on 2L throughout session with SpO2 91-93% during gait and 98% at rest. Pt continues to require guarding for safety and direction. Will continue to follow and encouraged mobility with nursing.    Follow Up Recommendations  Home health PT;Supervision/Assistance - 24 hour     Equipment Recommendations  Rolling walker with 5" wheels;3in1 (PT)    Recommendations for Other Services       Precautions / Restrictions Precautions Precautions: Fall    Mobility  Bed Mobility Overal bed mobility: Needs Assistance Bed Mobility: Supine to Sit     Supine to sit: Min guard;HOB elevated     General bed mobility comments: HOb 20 degrees with pt able to pivot to left with cues as pt initially trying to move to right despite bedrail  Transfers Overall transfer level: Needs assistance   Transfers: Sit to/from Stand Sit to Stand: Min guard         General transfer comment: cues for hand placement and safety  Ambulation/Gait Ambulation/Gait assistance: Min guard Gait Distance (Feet): 150 Feet Assistive device: Rolling walker (2 wheeled) Gait Pattern/deviations: Step-through pattern;Decreased stride length;Trunk flexed   Gait velocity interpretation: 1.31 - 2.62 ft/sec, indicative of limited community ambulator General Gait Details: cues for posture, safety to avoid running into objects and direction   Stairs             Wheelchair Mobility    Modified Rankin (Stroke Patients  Only)       Balance Overall balance assessment: Needs assistance Sitting-balance support: No upper extremity supported;Feet supported Sitting balance-Leahy Scale: Good     Standing balance support: Bilateral upper extremity supported Standing balance-Leahy Scale: Poor                              Cognition Arousal/Alertness: Awake/alert Behavior During Therapy: Flat affect Overall Cognitive Status: Impaired/Different from baseline Area of Impairment: Orientation;Attention;Memory;Following commands;Safety/judgement;Awareness;Problem solving                 Orientation Level: Disoriented to;Situation;Time Current Attention Level: Selective Memory: Decreased short-term memory Following Commands: Follows one step commands consistently Safety/Judgement: Decreased awareness of deficits   Problem Solving: Slow processing General Comments: pt oriented to place and person but asking to call his dad to come pick him up. On entry to room pt using call button as phone. Bed soaked in urine with pt stating awareness but no effort to contact staff      Exercises General Exercises - Lower Extremity Long Arc Quad: AROM;Both;Seated;20 reps Hip ABduction/ADduction: AROM;Both;Seated;20 reps Hip Flexion/Marching: AROM;Both;Seated;20 reps    General Comments        Pertinent Vitals/Pain Pain Assessment: No/denies pain    Home Living                      Prior Function            PT Goals (current goals can now be found in  the care plan section) Progress towards PT goals: Progressing toward goals    Frequency    Min 3X/week      PT Plan Current plan remains appropriate    Co-evaluation              AM-PAC PT "6 Clicks" Mobility   Outcome Measure  Help needed turning from your back to your side while in a flat bed without using bedrails?: A Little Help needed moving from lying on your back to sitting on the side of a flat bed without using  bedrails?: A Little Help needed moving to and from a bed to a chair (including a wheelchair)?: A Little Help needed standing up from a chair using your arms (e.g., wheelchair or bedside chair)?: A Little Help needed to walk in hospital room?: A Little Help needed climbing 3-5 steps with a railing? : A Lot 6 Click Score: 17    End of Session Equipment Utilized During Treatment: Gait belt Activity Tolerance: Patient tolerated treatment well Patient left: in chair;with call bell/phone within reach;with chair alarm set Nurse Communication: Mobility status PT Visit Diagnosis: Muscle weakness (generalized) (M62.81);Difficulty in walking, not elsewhere classified (R26.2)     Time: 1610-9604 PT Time Calculation (min) (ACUTE ONLY): 24 min  Charges:  $Gait Training: 8-22 mins $Therapeutic Exercise: 8-22 mins                     Macee Venables P, PT Acute Rehabilitation Services Pager: [phone] Office: [phone]    [facility] Justene Jensen 10/09/2020, 9:12 AM

## 2020-10-09 NOTE — Progress Notes (Signed)
Spoke w/ pts ex-wife [name] and provided discharge instructions, info on home health, and reviewed medications/changes.  Pt to be discharged home w/ Home Health.

## 2020-10-09 NOTE — Progress Notes (Addendum)
Spoke w/ pts dtr [name] to relay that pt has d/c orders.  Per [name], pts ex-wife [name] will be picking patient up and discharge instructions should be given to her.  Have attempted to reach [place] multiple times, unable, did leave vm.

## 2020-10-09 NOTE — TOC Transition Note (Signed)
Transition of Care [facility]) - CM/SW Discharge Note   Patient Details  Name: Jeffrey [name] MRN: [id number] Date of Birth: 1961/09/16  [facility] CM/SW Contact:  Bess Kinds, RN Phone Number: [phone] 10/09/2020, 3:32 PM   Clinical Narrative:     Patient to transition home today. Left voicemail for Uriah. Spoke with [name] who stated that she had reached [place] who was on her way to pick up patient. Discussed accepting home health agency, [facility], and services to be provided - RN, SW. [facility] to do a home visit this evening to set patient up with NIV. No further TOC needs identified.   Final next level of care: Home w Home Health Services Barriers to Discharge: No Barriers Identified   Patient Goals and CMS Choice Patient states their goals for this hospitalization and ongoing recovery are:: (P) Rehab with SNF CMS Medicare.gov Compare Post Acute Care list provided to:: Patient Choice offered to / list presented to : Patient, Adult Children (daughter, [name])  Discharge Placement                       Discharge Plan and Services In-house Referral: (P) [facility] Discharge Planning Services: CM Consult Post Acute Care Choice: Durable Medical Equipment, Home Health          DME Arranged: 3-N-1, NIV, Wheelchair manual DME Agency: [facility] Date DME Agency Contacted: 10/09/20 Time DME Agency Contacted: 1400 Representative spoke with at DME Agency: Ian Malkin HH Arranged: RN, Social Work   Date HH Agency Contacted: 10/09/20 Time HH Agency Contacted: 1500 Representative spoke with at [facility] Agency: Amy  Social Determinants of Health (SDOH) Interventions     Readmission Risk Interventions Readmission Risk Prevention Plan 09/30/2020 05/01/2019 03/30/2019  Transportation Screening Complete Complete -  Medication Review [employer]) Complete - -  PCP or Specialist appointment within 3-5 days of discharge - - Not Complete  PCP/Specialist Appt Not Complete  comments - - recommended for 1 week f/u, PCP closed today, patient to call tomorrow and make appt.   HRI or Home Care Consult Complete Complete -  SW Recovery Care/Counseling Consult Complete Complete -  Palliative Care Screening Not Applicable Not Applicable -  Skilled Nursing Facility - Not Applicable -  Some recent data might be hidden

## 2020-10-09 NOTE — Telephone Encounter (Signed)
Called to schedule f/u appt, was advised pt is in the hospital.

## 2020-10-09 NOTE — Discharge Summary (Signed)
187.4 lb Date of Birth:  [birth year]       BSA:          2.030 m Patient Age:    59 years        BP:           108/78 mmHg Patient Gender: M               HR:           103 bpm. [name] Procedure: 2D Echo Indications:    Congestive Heart Failure 428.0 / I50.9  History:        Patient  has prior history of Echocardiogram examinations, most                 recent 10/12/2019. COPD, Signs/Symptoms:Fever; Risk                 Factors:Dyslipidemia, Hypertension and Former Smoker. Bipolar,                 GERD.  Sonographer:    Jeryl Columbia RDCS (AE) Referring Phys: [id number] VINEET SOOD IMPRESSIONS  1. Left ventricular ejection fraction, by estimation, is 55 to 60%. The left ventricle has normal function. The left ventricle has no regional wall motion abnormalities. Left ventricular diastolic parameters are indeterminate.  2. Right ventricular systolic function is normal. The right ventricular size is normal.  3. Left atrial size was severely dilated.  4. Moderate to severe gradient across the MV (mean grade 10). 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 Consider TEE to better evaluate anatomy and severity of stenosis. . The mitral valve is normal in structure. Mild mitral valve regurgitation. No evidence of mitral stenosis.  5. The aortic valve is tricuspid. Aortic valve regurgitation is not visualized. No aortic stenosis is present.  6. The inferior vena cava is normal in size with greater than 50% respiratory variability, suggesting right atrial pressure of 3 mmHg. FINDINGS  Left Ventricle: Left ventricular ejection fraction, by estimation, is 55 to 60%. The left ventricle has normal function. The left ventricle has no regional wall motion abnormalities. The left ventricular internal cavity size was normal in size. There is  no left ventricular hypertrophy. Left ventricular diastolic parameters are indeterminate. Right Ventricle: The right ventricular size is normal. No increase in right ventricular wall thickness. Right ventricular systolic function is normal. Left Atrium: Left atrial size was severely dilated. Right Atrium: Right  atrial size was normal in size. Pericardium: There is no evidence of pericardial effusion. Mitral Valve: Moderate to severe gradient across the MV (mean grade 10). 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 Consider TEE to better evaluate anatomy and severity of stenosis. The mitral valve is normal in structure. Mild mitral valve regurgitation. No evidence of mitral valve stenosis. The mean mitral valve gradient is 10.5 mmHg with  average heart rate of 85 bpm. Tricuspid Valve: The tricuspid valve is normal in structure. Tricuspid valve regurgitation is not demonstrated. No evidence of tricuspid stenosis. Aortic Valve: The aortic valve is tricuspid. Aortic valve regurgitation is not visualized. No aortic stenosis is present. Aortic valve mean gradient measures 3.3 mmHg. Aortic valve peak gradient measures 6.2  187.4 lb Date of Birth:  [birth year]       BSA:          2.030 m Patient Age:    59 years        BP:           108/78 mmHg Patient Gender: M               HR:           103 bpm. [name] Procedure: 2D Echo Indications:    Congestive Heart Failure 428.0 / I50.9  History:        Patient  has prior history of Echocardiogram examinations, most                 recent 10/12/2019. COPD, Signs/Symptoms:Fever; Risk                 Factors:Dyslipidemia, Hypertension and Former Smoker. Bipolar,                 GERD.  Sonographer:    Jeryl Columbia RDCS (AE) Referring Phys: [id number] VINEET SOOD IMPRESSIONS  1. Left ventricular ejection fraction, by estimation, is 55 to 60%. The left ventricle has normal function. The left ventricle has no regional wall motion abnormalities. Left ventricular diastolic parameters are indeterminate.  2. Right ventricular systolic function is normal. The right ventricular size is normal.  3. Left atrial size was severely dilated.  4. Moderate to severe gradient across the MV (mean grade 10). 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 Consider TEE to better evaluate anatomy and severity of stenosis. . The mitral valve is normal in structure. Mild mitral valve regurgitation. No evidence of mitral stenosis.  5. The aortic valve is tricuspid. Aortic valve regurgitation is not visualized. No aortic stenosis is present.  6. The inferior vena cava is normal in size with greater than 50% respiratory variability, suggesting right atrial pressure of 3 mmHg. FINDINGS  Left Ventricle: Left ventricular ejection fraction, by estimation, is 55 to 60%. The left ventricle has normal function. The left ventricle has no regional wall motion abnormalities. The left ventricular internal cavity size was normal in size. There is  no left ventricular hypertrophy. Left ventricular diastolic parameters are indeterminate. Right Ventricle: The right ventricular size is normal. No increase in right ventricular wall thickness. Right ventricular systolic function is normal. Left Atrium: Left atrial size was severely dilated. Right Atrium: Right  atrial size was normal in size. Pericardium: There is no evidence of pericardial effusion. Mitral Valve: Moderate to severe gradient across the MV (mean grade 10). 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 Consider TEE to better evaluate anatomy and severity of stenosis. The mitral valve is normal in structure. Mild mitral valve regurgitation. No evidence of mitral valve stenosis. The mean mitral valve gradient is 10.5 mmHg with  average heart rate of 85 bpm. Tricuspid Valve: The tricuspid valve is normal in structure. Tricuspid valve regurgitation is not demonstrated. No evidence of tricuspid stenosis. Aortic Valve: The aortic valve is tricuspid. Aortic valve regurgitation is not visualized. No aortic stenosis is present. Aortic valve mean gradient measures 3.3 mmHg. Aortic valve peak gradient measures 6.2  Physician Discharge Summary  Jeffrey [name] ZOX:[id number] DOB: [birth year] DOA: 09/29/2020  PCP: Gareth Morgan, MD  Admit date: 09/29/2020 Discharge date: 10/09/2020  Admitted From: Home  Discharge disposition: Home with PT  Recommendations for Outpatient Follow-Up:   . Follow up with your primary care provider in one week.  . Check CBC, BMP, magnesium in the next visit . Patient has been considered for trilogy at home.  Discharge Diagnosis:   Active Problems:   Dyslipidemia   COPD exacerbation (HCC)   Essential hypertension   GERD (gastroesophageal reflux disease)   Acute on chronic respiratory failure with hypoxia and hypercapnia (HCC)   Acute metabolic encephalopathy   Lactic acidosis   Hyponatremia   Hyperglycemia   Elevated brain natriuretic peptide (BNP) level   (HFpEF) heart failure with preserved ejection fraction (HCC)   DNR (do not resuscitate) discussion   Pressure injury of skin   Discharge Condition: Improved.  Diet recommendation: Low sodium, heart healthy.    Wound care: None.  Code status: Full.   History of Present Illness:   59 years old male with past medical history of COPD on 3 to 4 L of oxygen at home, depression, bipolar disorder, chronic diastolic heart failure, hepatic encephalopathy and polysubstance abuse was brought into the hospital with respiratory distress.  Patient was then admitted hospital for acute hypoxic and hypercarbic respiratory failure requiring intubation.  Patient was initially at [facility] and was extubated after which he was put on BiPAP.  Hospital course was complicated by metabolic encephalopathy requiring Precedex drip including Ativan and fentanyl.  Patient was subsequently transferred from any pain to [facility] ICU since her family wished aggressive care.  Hospital Course:   Following conditions were addressed during hospitalization as listed below,  Acute on chronic hypoxic and hypercarbic  respiratory failure secondary to COPD exacerbation Improved clinically.  Initially was on mechanical ventilation followed by BiPAP.  At this time patient has been assessed for trilogy on discharge.  Will be continued on oxygen supplementation discharge as well.  Patient has completed antibiotic course.    Pulmonary edema/Acute on chronic diastolic CHF Received IV Lasix with improvement.  Currently evolving.  Acute metabolic encephalopathy This was likely multifactorial, hypoxia, ICU delirium, steroids, withdrawal from benzos.  Improved during hospitalization.  Initially required Precedex drip.  Subsequently was restarted on valproic acid and Prozac from home.  Hypokalemia Replenished.  Anemia of critical illness and chronic disease Remained stable.  Bipolar disorder/Depression -Restarted on valproic acid and Prozac.  Disposition.  At this time, patient is stable for disposition home.  With the patient's daughter on the phone regarding the plan for disposition.  Medical Consultants:    PCCM  Procedures:   Intubation and mechanical ventilation BIPAP Subjective:   Today, patient was seen and examined at bedside.  Continues to feel better.  Denies any pain, fever, chills or rigor.  Discharge Exam:   Vitals:   10/09/20 1600 10/09/20 1700  BP: 137/87   Pulse:  92  Resp: (!) 22 20  Temp:    SpO2:  96%   Vitals:   10/09/20 1400 10/09/20 1500 10/09/20 1600 10/09/20 1700  BP: 135/77 (!) 147/80 137/87   Pulse: 81 79  92  Resp: (!) 26 (!) 32 (!) 22 20  Temp:  99.2 F (37.3 C)    TempSrc:  Axillary    SpO2: 91% 96%  96%  Weight:      Height:  187.4 lb Date of Birth:  [birth year]       BSA:          2.030 m Patient Age:    59 years        BP:           108/78 mmHg Patient Gender: M               HR:           103 bpm. [name] Procedure: 2D Echo Indications:    Congestive Heart Failure 428.0 / I50.9  History:        Patient  has prior history of Echocardiogram examinations, most                 recent 10/12/2019. COPD, Signs/Symptoms:Fever; Risk                 Factors:Dyslipidemia, Hypertension and Former Smoker. Bipolar,                 GERD.  Sonographer:    Jeryl Columbia RDCS (AE) Referring Phys: [id number] VINEET SOOD IMPRESSIONS  1. Left ventricular ejection fraction, by estimation, is 55 to 60%. The left ventricle has normal function. The left ventricle has no regional wall motion abnormalities. Left ventricular diastolic parameters are indeterminate.  2. Right ventricular systolic function is normal. The right ventricular size is normal.  3. Left atrial size was severely dilated.  4. Moderate to severe gradient across the MV (mean grade 10). 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 Consider TEE to better evaluate anatomy and severity of stenosis. . The mitral valve is normal in structure. Mild mitral valve regurgitation. No evidence of mitral stenosis.  5. The aortic valve is tricuspid. Aortic valve regurgitation is not visualized. No aortic stenosis is present.  6. The inferior vena cava is normal in size with greater than 50% respiratory variability, suggesting right atrial pressure of 3 mmHg. FINDINGS  Left Ventricle: Left ventricular ejection fraction, by estimation, is 55 to 60%. The left ventricle has normal function. The left ventricle has no regional wall motion abnormalities. The left ventricular internal cavity size was normal in size. There is  no left ventricular hypertrophy. Left ventricular diastolic parameters are indeterminate. Right Ventricle: The right ventricular size is normal. No increase in right ventricular wall thickness. Right ventricular systolic function is normal. Left Atrium: Left atrial size was severely dilated. Right Atrium: Right  atrial size was normal in size. Pericardium: There is no evidence of pericardial effusion. Mitral Valve: Moderate to severe gradient across the MV (mean grade 10). 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 Consider TEE to better evaluate anatomy and severity of stenosis. The mitral valve is normal in structure. Mild mitral valve regurgitation. No evidence of mitral valve stenosis. The mean mitral valve gradient is 10.5 mmHg with  average heart rate of 85 bpm. Tricuspid Valve: The tricuspid valve is normal in structure. Tricuspid valve regurgitation is not demonstrated. No evidence of tricuspid stenosis. Aortic Valve: The aortic valve is tricuspid. Aortic valve regurgitation is not visualized. No aortic stenosis is present. Aortic valve mean gradient measures 3.3 mmHg. Aortic valve peak gradient measures 6.2  187.4 lb Date of Birth:  [birth year]       BSA:          2.030 m Patient Age:    59 years        BP:           108/78 mmHg Patient Gender: M               HR:           103 bpm. [name] Procedure: 2D Echo Indications:    Congestive Heart Failure 428.0 / I50.9  History:        Patient  has prior history of Echocardiogram examinations, most                 recent 10/12/2019. COPD, Signs/Symptoms:Fever; Risk                 Factors:Dyslipidemia, Hypertension and Former Smoker. Bipolar,                 GERD.  Sonographer:    Jeryl Columbia RDCS (AE) Referring Phys: [id number] VINEET SOOD IMPRESSIONS  1. Left ventricular ejection fraction, by estimation, is 55 to 60%. The left ventricle has normal function. The left ventricle has no regional wall motion abnormalities. Left ventricular diastolic parameters are indeterminate.  2. Right ventricular systolic function is normal. The right ventricular size is normal.  3. Left atrial size was severely dilated.  4. Moderate to severe gradient across the MV (mean grade 10). 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 Consider TEE to better evaluate anatomy and severity of stenosis. . The mitral valve is normal in structure. Mild mitral valve regurgitation. No evidence of mitral stenosis.  5. The aortic valve is tricuspid. Aortic valve regurgitation is not visualized. No aortic stenosis is present.  6. The inferior vena cava is normal in size with greater than 50% respiratory variability, suggesting right atrial pressure of 3 mmHg. FINDINGS  Left Ventricle: Left ventricular ejection fraction, by estimation, is 55 to 60%. The left ventricle has normal function. The left ventricle has no regional wall motion abnormalities. The left ventricular internal cavity size was normal in size. There is  no left ventricular hypertrophy. Left ventricular diastolic parameters are indeterminate. Right Ventricle: The right ventricular size is normal. No increase in right ventricular wall thickness. Right ventricular systolic function is normal. Left Atrium: Left atrial size was severely dilated. Right Atrium: Right  atrial size was normal in size. Pericardium: There is no evidence of pericardial effusion. Mitral Valve: Moderate to severe gradient across the MV (mean grade 10). 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 Consider TEE to better evaluate anatomy and severity of stenosis. The mitral valve is normal in structure. Mild mitral valve regurgitation. No evidence of mitral valve stenosis. The mean mitral valve gradient is 10.5 mmHg with  average heart rate of 85 bpm. Tricuspid Valve: The tricuspid valve is normal in structure. Tricuspid valve regurgitation is not demonstrated. No evidence of tricuspid stenosis. Aortic Valve: The aortic valve is tricuspid. Aortic valve regurgitation is not visualized. No aortic stenosis is present. Aortic valve mean gradient measures 3.3 mmHg. Aortic valve peak gradient measures 6.2  Physician Discharge Summary  Jeffrey [name] ZOX:[id number] DOB: [birth year] DOA: 09/29/2020  PCP: Gareth Morgan, MD  Admit date: 09/29/2020 Discharge date: 10/09/2020  Admitted From: Home  Discharge disposition: Home with PT  Recommendations for Outpatient Follow-Up:   . Follow up with your primary care provider in one week.  . Check CBC, BMP, magnesium in the next visit . Patient has been considered for trilogy at home.  Discharge Diagnosis:   Active Problems:   Dyslipidemia   COPD exacerbation (HCC)   Essential hypertension   GERD (gastroesophageal reflux disease)   Acute on chronic respiratory failure with hypoxia and hypercapnia (HCC)   Acute metabolic encephalopathy   Lactic acidosis   Hyponatremia   Hyperglycemia   Elevated brain natriuretic peptide (BNP) level   (HFpEF) heart failure with preserved ejection fraction (HCC)   DNR (do not resuscitate) discussion   Pressure injury of skin   Discharge Condition: Improved.  Diet recommendation: Low sodium, heart healthy.    Wound care: None.  Code status: Full.   History of Present Illness:   59 years old male with past medical history of COPD on 3 to 4 L of oxygen at home, depression, bipolar disorder, chronic diastolic heart failure, hepatic encephalopathy and polysubstance abuse was brought into the hospital with respiratory distress.  Patient was then admitted hospital for acute hypoxic and hypercarbic respiratory failure requiring intubation.  Patient was initially at [facility] and was extubated after which he was put on BiPAP.  Hospital course was complicated by metabolic encephalopathy requiring Precedex drip including Ativan and fentanyl.  Patient was subsequently transferred from any pain to [facility] ICU since her family wished aggressive care.  Hospital Course:   Following conditions were addressed during hospitalization as listed below,  Acute on chronic hypoxic and hypercarbic  respiratory failure secondary to COPD exacerbation Improved clinically.  Initially was on mechanical ventilation followed by BiPAP.  At this time patient has been assessed for trilogy on discharge.  Will be continued on oxygen supplementation discharge as well.  Patient has completed antibiotic course.    Pulmonary edema/Acute on chronic diastolic CHF Received IV Lasix with improvement.  Currently evolving.  Acute metabolic encephalopathy This was likely multifactorial, hypoxia, ICU delirium, steroids, withdrawal from benzos.  Improved during hospitalization.  Initially required Precedex drip.  Subsequently was restarted on valproic acid and Prozac from home.  Hypokalemia Replenished.  Anemia of critical illness and chronic disease Remained stable.  Bipolar disorder/Depression -Restarted on valproic acid and Prozac.  Disposition.  At this time, patient is stable for disposition home.  With the patient's daughter on the phone regarding the plan for disposition.  Medical Consultants:    PCCM  Procedures:   Intubation and mechanical ventilation BIPAP Subjective:   Today, patient was seen and examined at bedside.  Continues to feel better.  Denies any pain, fever, chills or rigor.  Discharge Exam:   Vitals:   10/09/20 1600 10/09/20 1700  BP: 137/87   Pulse:  92  Resp: (!) 22 20  Temp:    SpO2:  96%   Vitals:   10/09/20 1400 10/09/20 1500 10/09/20 1600 10/09/20 1700  BP: 135/77 (!) 147/80 137/87   Pulse: 81 79  92  Resp: (!) 26 (!) 32 (!) 22 20  Temp:  99.2 F (37.3 C)    TempSrc:  Axillary    SpO2: 91% 96%  96%  Weight:      Height:  187.4 lb Date of Birth:  [birth year]       BSA:          2.030 m Patient Age:    59 years        BP:           108/78 mmHg Patient Gender: M               HR:           103 bpm. [name] Procedure: 2D Echo Indications:    Congestive Heart Failure 428.0 / I50.9  History:        Patient  has prior history of Echocardiogram examinations, most                 recent 10/12/2019. COPD, Signs/Symptoms:Fever; Risk                 Factors:Dyslipidemia, Hypertension and Former Smoker. Bipolar,                 GERD.  Sonographer:    Jeryl Columbia RDCS (AE) Referring Phys: [id number] VINEET SOOD IMPRESSIONS  1. Left ventricular ejection fraction, by estimation, is 55 to 60%. The left ventricle has normal function. The left ventricle has no regional wall motion abnormalities. Left ventricular diastolic parameters are indeterminate.  2. Right ventricular systolic function is normal. The right ventricular size is normal.  3. Left atrial size was severely dilated.  4. Moderate to severe gradient across the MV (mean grade 10). 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 Consider TEE to better evaluate anatomy and severity of stenosis. . The mitral valve is normal in structure. Mild mitral valve regurgitation. No evidence of mitral stenosis.  5. The aortic valve is tricuspid. Aortic valve regurgitation is not visualized. No aortic stenosis is present.  6. The inferior vena cava is normal in size with greater than 50% respiratory variability, suggesting right atrial pressure of 3 mmHg. FINDINGS  Left Ventricle: Left ventricular ejection fraction, by estimation, is 55 to 60%. The left ventricle has normal function. The left ventricle has no regional wall motion abnormalities. The left ventricular internal cavity size was normal in size. There is  no left ventricular hypertrophy. Left ventricular diastolic parameters are indeterminate. Right Ventricle: The right ventricular size is normal. No increase in right ventricular wall thickness. Right ventricular systolic function is normal. Left Atrium: Left atrial size was severely dilated. Right Atrium: Right  atrial size was normal in size. Pericardium: There is no evidence of pericardial effusion. Mitral Valve: Moderate to severe gradient across the MV (mean grade 10). 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 Consider TEE to better evaluate anatomy and severity of stenosis. The mitral valve is normal in structure. Mild mitral valve regurgitation. No evidence of mitral valve stenosis. The mean mitral valve gradient is 10.5 mmHg with  average heart rate of 85 bpm. Tricuspid Valve: The tricuspid valve is normal in structure. Tricuspid valve regurgitation is not demonstrated. No evidence of tricuspid stenosis. Aortic Valve: The aortic valve is tricuspid. Aortic valve regurgitation is not visualized. No aortic stenosis is present. Aortic valve mean gradient measures 3.3 mmHg. Aortic valve peak gradient measures 6.2

## 2020-10-09 NOTE — Progress Notes (Signed)
Occupational Therapy Treatment Patient Details Name: Jeffrey [name] MRN: [id number] DOB: 23-Apr-1961 Today's Date: 10/09/2020    History of present illness Pt is a 59 y/o M presenting to the ED on 11/7 for SOB. Pt was intubated on 11/7 and extubated on the 8th. 11/13 was transferred to [facility] MICU. PMH includes COPD, anemia, anxiety, depression, arthritis, bipolar disorder, CHF, HTN, [name] and hyperlipidemia.   OT comments  Pt eager for participation in therapy session. Pt wanting to wash up. Pt used a warm shower cap to wash and brush hair, and then bathed using warm wash cloths provided by OT. Pt is min A as he required verbal cues for sequencing and thoroughness and assist for line management. OT removed O2 during face washing and on RA his O2 saturations dropped within 4 min to below 90%. Supplemental O2 reapplied at that time. Pt is min guard for sit<>stand transfers this session. Pt remains pleasantly disoriented, but very motivated. OT will continue to follow acutely and POC remains appropriate with post-acute HHOT.    Follow Up Recommendations  Home health OT;Supervision/Assistance - 24 hour    Equipment Recommendations  3 in 1 bedside commode    Recommendations for Other Services PT consult    Precautions / Restrictions Precautions Precautions: Fall Restrictions Weight Bearing Restrictions: No       Mobility Bed Mobility Overal bed mobility: Needs Assistance Bed Mobility: Supine to Sit     Supine to sit: Min guard;HOB elevated     General bed mobility comments: OOB in recliner at beginning and end of session  Transfers Overall transfer level: Needs assistance Equipment used: 1 person hand held assist Transfers: Sit to/from Stand Sit to Stand: Min guard         General transfer comment: cues for hand placement and safety    Balance Overall balance assessment: Needs assistance Sitting-balance support: No upper extremity supported;Feet supported Sitting  balance-Leahy Scale: Good     Standing balance support: Bilateral upper extremity supported Standing balance-Leahy Scale: Poor Standing balance comment: external support from OT or RW essential at this time                           ADL either performed or assessed with clinical judgement   ADL Overall ADL's : Needs assistance/impaired     Grooming: Wash/dry face;Oral care;Minimal assistance;Cueing for sequencing;Sitting;Applying deodorant;Brushing hair Grooming Details (indicate cue type and reason): verbal cues for step by step instructions and attention to task; no physical assist needed Upper Body Bathing: Minimal assistance;Sitting Upper Body Bathing Details (indicate cue type and reason): assist for attention to task and cues for sequencing, min physical assist due  to line management Lower Body Bathing: Minimal assistance;Sit to/from stand                       Functional mobility during ADLs: Minimal assistance;Cueing for safety General ADL Comments: easily fatigues     Vision       Perception     Praxis      Cognition Arousal/Alertness: Awake/alert Behavior During Therapy: Flat affect Overall Cognitive Status: Impaired/Different from baseline Area of Impairment: Orientation;Attention;Memory;Following commands;Safety/judgement;Awareness;Problem solving                 Orientation Level: Disoriented to;Situation;Time Current Attention Level: Selective Memory: Decreased short-term memory Following Commands: Follows one step commands consistently Safety/Judgement: Decreased awareness of deficits Awareness: Intellectual Problem Solving: Slow processing General Comments: Pt  could not operate call bell as remote at end of session, needed assist to answer phone at end of session, cues and unable to follow multi-step commands at this time        Exercises General Exercises - Lower Extremity Long Arc Quad: AROM;Both;Seated;20 reps Hip  ABduction/ADduction: AROM;Both;Seated;20 reps Hip Flexion/Marching: AROM;Both;Seated;20 reps   Shoulder Instructions       General Comments removed O2 during bathing and within 4 min on RA SpO2 sunk below 90%    Pertinent Vitals/ Pain       Pain Assessment: Faces Faces Pain Scale: Hurts a little bit Pain Location: bottom Pain Descriptors / Indicators: Discomfort;Grimacing Pain Intervention(s): Monitored during session;Repositioned  Home Living                                          Prior Functioning/Environment              Frequency  Min 2X/week        Progress Toward Goals  OT Goals(current goals can now be found in the care plan section)  Progress towards OT goals: Progressing toward goals  Acute Rehab OT Goals Patient Stated Goal: to get better and go home OT Goal Formulation: With patient Time For Goal Achievement: 10/21/20 Potential to Achieve Goals: Good  Plan Discharge plan remains appropriate;Frequency remains appropriate    Co-evaluation                 AM-PAC OT "6 Clicks" Daily Activity     Outcome Measure   Help from another person eating meals?: None Help from another person taking care of personal grooming?: A Little Help from another person toileting, which includes using toliet, bedpan, or urinal?: A Lot Help from another person bathing (including washing, rinsing, drying)?: A Little Help from another person to put on and taking off regular upper body clothing?: A Little Help from another person to put on and taking off regular lower body clothing?: A Little 6 Click Score: 18    End of Session Equipment Utilized During Treatment: Oxygen  OT Visit Diagnosis: Unsteadiness on feet (R26.81);Other abnormalities of gait and mobility (R26.89);Muscle weakness (generalized) (M62.81);Other symptoms and signs involving cognitive function;Pain Pain - part of body:  (bottom)   Activity Tolerance Patient tolerated treatment  well   Patient Left in chair;with call bell/phone within reach;with chair alarm set   Nurse Communication Mobility status        Time: 8657-8469 OT Time Calculation (min): 22 min  Charges: OT General Charges $OT Visit: 1 Visit OT Treatments $Self Care/Home Management : 8-22 mins  Nyoka Cowden OTR/L Acute Rehabilitation Services Pager: [phone] Office: [phone]   Evern Bio Param Capri 10/09/2020, 12:36 PM

## 2020-10-09 NOTE — TOC Progression Note (Signed)
Transition of Care [facility]) - Progression Note    Patient Details  Name: Jeffrey [name] MRN: [id number] Date of Birth: September 03, 1961  Transition of Care [facility]) CM/SW Contact  Bess Kinds, RN Phone Number: [phone] 10/09/2020, 12:32 PM  Clinical Narrative:     Referral for [facility] accepted by [facility] for RN and SW. Patient will need [facility] RN and SW orders with Face to Face.   Notified by [employer] that patient has current prior authorization for NIV until 2022. [employer] will arrange to meet patient at home for set up on day of discharge.   TOC following for transition needs.   Expected Discharge Plan: Home w Home Health Services Barriers to Discharge: Continued Medical Work up  Expected Discharge Plan and Services Expected Discharge Plan: Home w Home Health Services In-house Referral: (P) [facility] Discharge Planning Services: CM Consult Post Acute Care Choice: Durable Medical Equipment, Home Health Living arrangements for the past 2 months: Single Family Home                 DME Arranged: 3-N-1, NIV, Wheelchair manual DME Agency: [employer] Date DME Agency Contacted: 10/08/20 Time DME Agency Contacted: 1427 Representative spoke with at DME Agency: Ian Malkin HH Arranged: PT, OT   Date HH Agency Contacted: (P) 11/21/20       Social Determinants of Health (SDOH) Interventions    Readmission Risk Interventions Readmission Risk Prevention Plan 09/30/2020 05/01/2019 03/30/2019  Transportation Screening Complete Complete -  Medication Review [facility]) Complete - -  PCP or Specialist appointment within 3-5 days of discharge - - Not Complete  PCP/Specialist Appt Not Complete comments - - recommended for 1 week f/u, PCP closed today, patient to call tomorrow and make appt.   HRI or Home Care Consult Complete Complete -  SW Recovery Care/Counseling Consult Complete Complete -  Palliative Care Screening Not Applicable Not Applicable -  Skilled Nursing Facility - Not Applicable -  Some  recent data might be hidden

## 2020-10-11 ENCOUNTER — Telehealth: Payer: Self-pay | Admitting: Cardiology

## 2020-10-11 NOTE — Telephone Encounter (Signed)
Marisue Ivan with [facility] called. States that Mr. Tappan was d/c [facility] on 10/10/2020. She needs to review discharge medications.  Please call [phone]

## 2020-10-11 NOTE — Telephone Encounter (Signed)
Medication list reviewed with home health nurse Marisue Ivan. Per Marisue Ivan patient, has propranolol at home vs metoprolol. Advised that discharge summary has metoprolol and not propranolol. Advised that our office have not prescribed propranolol. Verbalized understanding.

## 2020-10-23 NOTE — Progress Notes (Signed)
Cardiology Office Note  Date: 10/24/2020   ID: Jeffrey [name], DOB 1961-02-01, MRN [id number]  PCP:  Gareth Morgan, MD  Cardiologist:  No primary care provider on file. Electrophysiologist:  None   Chief Complaint: Hospital follow up Acute respiratory failure, COPD exacerbation.  Chronic diastolic heart failure.  History of Present Illness: Jeffrey [name] is a 59 y.o. male with a history of hypertension, COPD, [name], acute on chronic respiratory failure, hyperglycemia, hyponatremia, chronic diastolic heart failure, bipolar disorder, polysubstance abuse, hepatic encephalopathy, depression.  Patient was admitted to [facility] on 09/29/2020 with with respiratory distress admitted for acute hypoxic and hypercarbic respiratory failure requiring intubation.  He was eventually extubated and placed on BiPAP.  Hospital course complicated by encephalopathy.  He was transferred to [facility] for more aggressive care.  He experienced pulmonary edema/acute on chronic diastolic heart failure and treated with IV Lasix with improvement.  His acute metabolic encephalopathy  improved and was deemed likely multifactorial secondary to hypoxia, ICU delirium, steroids, withdrawal from benzodiazepines.    He is here for hospital follow-up today.  States he is doing well all things considered.  He is on continuous O2 for severe COPD.  He has an upcoming follow-up with Dr. Sherene Sires pulmonology.  He has gained some weight approximately 10 pounds but denies any shortness of breath out of the ordinary for him.  No lower extremity edema.  His echocardiogram showed.  Denies any PND, orthopnea, orthostatic symptoms, CVA or TIA-like symptoms, palpitations, arrhythmias.  Past Medical History:  Diagnosis Date  . Acute blood loss anemia 02/07/2017  . Anxiety   . Arthritis    deg disease, bulging disk,  shoulder level  . Bipolar disorder (HCC)   . Bipolar disorder (HCC)   . CHF (congestive heart failure) (HCC)   .  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 Depression    anxiety  . Hyperlipidemia   . Hypertension   . Peptic ulcer disease    Review  . Pneumonia   . [name] (peripheral vascular disease) (HCC) 06/18/2015  . Shortness of breath     Past Surgical History:  Procedure Laterality Date  . BIOPSY  02/09/2017   Procedure: BIOPSY;  Surgeon: [name], MD;  Location: [facility];  Service: Endoscopy;;  duodenal gastric  . COLONOSCOPY  03/14/2007   LPF:[id number] colonoscopy and terminal ileoscopy except external hemorrhoids  . COLONOSCOPY N/A 05/05/2013   [name]: external/internal anal canal hemorrhoids, unable to intubate TI, segemental biopsies unremarkable   . COLONOSCOPY N/A 04/11/2017   Procedure: COLONOSCOPY;  Surgeon: [name], MD;  Location: [facility];  Service: Endoscopy;  Laterality: N/A;  . COLONOSCOPY WITH PROPOFOL N/A 03/31/2017   Procedure: COLONOSCOPY WITH PROPOFOL;  Surgeon: [name], MD;  Location: [facility];  Service: Endoscopy;  Laterality: N/A;  1:45pm  . ESOPHAGOGASTRODUODENOSCOPY  03/14/2007   XBD:[id number] antral gastritis with bulbar duodenitis/paucity to postbulbar duodenal folds and biopsy were benign with no evidence of villous atrophy.  . ESOPHAGOGASTRODUODENOSCOPY (EGD) WITH PROPOFOL N/A 02/09/2017   Procedure: ESOPHAGOGASTRODUODENOSCOPY (EGD) WITH PROPOFOL;  Surgeon: [name], MD;  Location: [facility];  Service: Endoscopy;  Laterality: N/A;  . GIVENS CAPSULE STUDY N/A 04/08/2017   Procedure: GIVENS CAPSULE STUDY;  Surgeon: [name], MD;  Location: [facility];  Service: Endoscopy;  Laterality: N/A;  . HAND SURGERY     left, secondary to self-inflicted laceration  . HEMORRHOID SURGERY N/A 04/14/2017  Procedure: HEMORRHOIDECTOMY;  Surgeon: [name], MD;  Location: [facility] ORS;  Service: General;  Laterality: N/A;  . SHOULDER SURGERY     right  . TOE SURGERY     left great toe , amputated-lawnmover accident    Current Outpatient  Medications  Medication Sig Dispense Refill  . acetaminophen (TYLENOL) 325 MG tablet Take 2 tablets (650 mg total) by mouth every 6 (six) hours as needed for mild pain, fever or headache (or Fever >/= 101). 12 tablet 0  . albuterol (PROVENTIL) (2.5 MG/3ML) 0.083% nebulizer solution Take 3 mLs (2.5 mg total) by nebulization every 4 (four) hours as needed for wheezing or shortness of breath. 75 mL 12  . albuterol (VENTOLIN HFA) 108 (90 Base) MCG/ACT inhaler Inhale 2 puffs into the lungs every 4 (four) hours as needed for wheezing or shortness of breath. 6.7 g 3  . aspirin EC 81 MG tablet Take 1 tablet (81 mg total) by mouth daily with breakfast. 30 tablet 4  . atorvastatin (LIPITOR) 20 MG tablet Take 1 tablet (20 mg total) by mouth daily. For high cholesterol (Patient taking differently: Take 20 mg by mouth every evening. For high cholesterol) 30 tablet 0  . diltiazem (CARDIZEM CD) 180 MG 24 hr capsule Take 180 mg by mouth daily.    . divalproex (DEPAKOTE ER) 500 MG 24 hr tablet Take 3 tablets (1,500 mg total) by mouth at bedtime. 90 tablet 2  . FLUoxetine (PROZAC) 20 MG capsule Take 1 capsule (20 mg total) by mouth daily. 30 capsule 2  . fluticasone (FLONASE) 50 MCG/ACT nasal spray Place 1 spray into both nostrils daily.    . Fluticasone-Umeclidin-Vilant (TRELEGY ELLIPTA) 100-62.5-25 MCG/INH AEPB Inhale 1 puff into the lungs daily. (Patient taking differently: Inhale 2 puffs into the lungs daily. ) 14 each 0  . folic acid (FOLVITE) 1 MG tablet Take 1 mg by mouth every morning.     . furosemide (LASIX) 40 MG tablet Take 40 mg by mouth daily.    Marland Kitchen gabapentin (NEURONTIN) 300 MG capsule Take 2 capsules (600 mg total) by mouth 2 (two) times daily. 120 capsule 2  . guaiFENesin (MUCINEX) 600 MG 12 hr tablet Take 1 tablet (600 mg total) by mouth 2 (two) times daily as needed for cough or to loosen phlegm. 20 tablet 0  . metoprolol tartrate (LOPRESSOR) 25 MG tablet Take 0.5 tablets (12.5 mg total) by mouth 2  (two) times daily. 30 tablet 2  . Multiple Vitamin (MULTIVITAMIN WITH MINERALS) TABS tablet Take 1 tablet by mouth daily. (Patient taking differently: Take 1 tablet by mouth every morning. ) 30 tablet 0  . Multiple Vitamin (MULTIVITAMIN) tablet Take 1 tablet by mouth daily.    Marland Kitchen OLANZapine (ZYPREXA) 10 MG tablet Take 1 tablet (10 mg total) by mouth 2 (two) times daily. 60 tablet 2  . oxymetazoline (AFRIN) 0.05 % nasal spray Place 1 spray into both nostrils daily.    . traZODone (DESYREL) 50 MG tablet Take 1 tablet (50 mg total) by mouth at bedtime. 30 tablet 2  . vitamin B-12 (CYANOCOBALAMIN) 500 MCG tablet Take 1,000 mcg by mouth every morning.      No current facility-administered medications for this visit.   Allergies:  Augmentin [amoxicillin-pot clavulanate] and Ace inhibitors   Social History: The patient  reports that he quit smoking about 12 months ago. His smoking use included cigarettes. He started smoking about 49 years ago. He has a 40.00 pack-year smoking history. He quit smokeless tobacco  use about 12 months ago. He reports previous drug use. Drug: Marijuana. He reports that he does not drink alcohol.   Family History: The patient's family history includes Anxiety disorder in his daughter and son; Asthma in his brother; Breast cancer in his mother; Depression in his daughter and son; Emphysema in his maternal grandfather; Heart attack in his maternal aunt, maternal grandfather, maternal grandmother, maternal uncle, paternal aunt, paternal grandfather, paternal grandmother, and paternal uncle; Heart disease in his father.   ROS:  Please see the history of present illness. Otherwise, complete review of systems is positive for none.  All other systems are reviewed and negative.   Physical Exam: VS:  BP 130/64   Pulse 78   Ht 5\' 10"  (1.778 m)   Wt 181 lb (82.1 kg)   SpO2 96% Comment: on 4L O2 via [place]  BMI 25.97 kg/m , BMI Body mass index is 25.97 kg/m.  Wt Readings from Last 3  Encounters:  10/24/20 181 lb (82.1 kg)  10/09/20 171 lb 4.8 oz (77.7 kg)  04/26/20 181 lb 8 oz (82.3 kg)    General: Patient appears comfortable at rest. Neck: Supple, no elevated JVP or carotid bruits, no thyromegaly. Lungs: Diminished breath sounds with prolonged expiratory phase on continuous O2, nonlabored breathing at rest. Cardiac: Regular rate and rhythm, no S3 or significant systolic murmur, no pericardial rub. Extremities: No pitting edema, distal pulses 2+. Skin: Warm and dry. Musculoskeletal: No kyphosis. Neuropsychiatric: Alert and oriented x3, affect grossly appropriate.  ECG:    Recent Labwork: 09/29/2020: B Natriuretic Peptide 147.0 10/06/2020: Magnesium 1.7 10/07/2020: ALT 35; AST 33 10/08/2020: BUN 10; Creatinine, Ser 0.54; Hemoglobin 10.4; Platelets 322; Potassium 3.6; Sodium 135     Component Value Date/Time   TRIG 101 10/02/2020 0554    Other Studies Reviewed Today:  Echocardiogram 10/04/2020 1. Left ventricular ejection fraction, by estimation, is 55 to 60%. The left ventricle has normal function. The left ventricle has no regional wall motion abnormalities. Left ventricular diastolic parameters are indeterminate. 2. Right ventricular systolic function is normal. The right ventricular size is normal. 3. Left atrial size was severely dilated. 4. Moderate to severe gradient across the MV (mean grade 10). 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 Consider TEE to better evaluate anatomy and severity of stenosis. . The mitral valve is normal in structure. Mild mitral valve regurgitation. No evidence of mitral stenosis. 5. The aortic valve is tricuspid. Aortic valve regurgitation is not visualized. No aortic stenosis is present. 6. The inferior vena cava is normal in size with greater than 50%  respiratory variability, suggesting right atrial pressure of 3 mmHg.  Assessment and Plan:  1. Chronic diastolic heart failure (HCC)   2. Mitral valve stenosis, unspecified etiology   3. Essential hypertension   4. COPD, severe (HCC)     1. Chronic diastolic heart failure (HCC) Left ventricular diastolic parameters were indeterminate on recent echo.  Patient has gained approximately 10 pounds since discharge from the hospital.  He has chronic dyspnea due to severe COPD.  No lower extremity edema.  No evidence of volume overload during auscultation of his lungs.  Continue Lasix 40 mg p.o. daily.  Continue metoprolol 12.5 mg p.o. twice daily.  2. Mitral valve stenosis, unspecified etiology Echocardiogram on 10/04/2020 showed EF of 55 to 60%.  Severe left atrial dilation.  Moderate to severe gradient across the mitral valve mean gradient of 10.  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  Report suggests considering TEE to better evaluate anatomy and severity of stenosis.  Did have some mild mitral regurgitation.  Spoke with Dr. Wyline MoodBranch today regarding suggestion that patient have a TEE to better evaluate the mitral valve.  Dr. Wyline MoodBranch states we should probably wait until he is seen by pulmonary and determine later whether he can undergoTEE to better assess the valve.  3. Essential hypertension Blood pressure today is 130/64.  Continue Cardizem CD 180 mg daily.  4. COPD, severe (HCC) COPD Gold stage II/III but O2 dependent.  Has seen Dr. Sherene SiresWert recently on July 31, 2020.  He has a follow-up December 13 at 2 PM with Dr. Sherene SiresWert.  Recent admission on 09/29/2020 for acute on chronic respiratory failure with hypercapnia/hypoxia.  He is on continuous O2 at 4 L/min today.  He states his only complaint is shortness of breath.  5.  Hyperlipidemia Continue atorvastatin 20 mg p.o.  daily.   Medication Adjustments/Labs and Tests Ordered: Current medicines are reviewed at length with the patient today.  Concerns regarding medicines are outlined above.   Disposition: Follow-up with Dr. Wyline MoodBranch 2 months  Signed, Rennis HardingAndrew Quinn, NP 10/24/2020 3:05 PM    [facility] [address], [place], [place] [address] Phone: [phone]; Fax: [phone]

## 2020-10-24 ENCOUNTER — Encounter: Payer: Self-pay | Admitting: Family Medicine

## 2020-10-24 ENCOUNTER — Ambulatory Visit (INDEPENDENT_AMBULATORY_CARE_PROVIDER_SITE_OTHER): Payer: Medicare Other | Admitting: Family Medicine

## 2020-10-24 VITALS — BP 130/64 | HR 78 | Ht 70.0 in | Wt 181.0 lb

## 2020-10-24 DIAGNOSIS — I10 Essential (primary) hypertension: Secondary | ICD-10-CM

## 2020-10-24 DIAGNOSIS — I050 Rheumatic mitral stenosis: Secondary | ICD-10-CM

## 2020-10-24 DIAGNOSIS — I5032 Chronic diastolic (congestive) heart failure: Secondary | ICD-10-CM

## 2020-10-24 DIAGNOSIS — J449 Chronic obstructive pulmonary disease, unspecified: Secondary | ICD-10-CM

## 2020-10-24 NOTE — Patient Instructions (Signed)
Medication Instructions:   Your physician recommends that you continue on your current medications as directed. Please refer to the Current Medication list given to you today.  Labwork:  None  Testing/Procedures:  None  Follow-Up:  Your physician recommends that you schedule a follow-up appointment in: 2 month with Dr. Wyline Mood.  Any Other Special Instructions Will Be Listed Below (If Applicable).  If you need a refill on your cardiac medications before your next appointment, please call your pharmacy.

## 2020-10-25 ENCOUNTER — Encounter (HOSPITAL_COMMUNITY): Payer: Self-pay | Admitting: Psychiatry

## 2020-10-25 ENCOUNTER — Other Ambulatory Visit: Payer: Self-pay

## 2020-10-25 ENCOUNTER — Telehealth (INDEPENDENT_AMBULATORY_CARE_PROVIDER_SITE_OTHER): Payer: Medicare Other | Admitting: Psychiatry

## 2020-10-25 DIAGNOSIS — F3162 Bipolar disorder, current episode mixed, moderate: Secondary | ICD-10-CM | POA: Diagnosis not present

## 2020-10-25 MED ORDER — TRAZODONE HCL 50 MG PO TABS
50.0000 mg | ORAL_TABLET | Freq: Every day | ORAL | 2 refills | Status: DC
Start: 2020-10-25 — End: 2021-01-22

## 2020-10-25 MED ORDER — FLUOXETINE HCL 20 MG PO CAPS
20.0000 mg | ORAL_CAPSULE | Freq: Every day | ORAL | 2 refills | Status: DC
Start: 2020-10-25 — End: 2021-01-22

## 2020-10-25 MED ORDER — DIVALPROEX SODIUM ER 500 MG PO TB24
1500.0000 mg | ORAL_TABLET | Freq: Every day | ORAL | 2 refills | Status: DC
Start: 2020-10-25 — End: 2021-01-22

## 2020-10-25 MED ORDER — GABAPENTIN 300 MG PO CAPS: 600.0000 mg | ORAL_CAPSULE | Freq: Two times a day (BID) | ORAL | 2 refills | Status: DC

## 2020-10-25 MED ORDER — OLANZAPINE 10 MG PO TABS: 10.0000 mg | ORAL_TABLET | Freq: Two times a day (BID) | ORAL | 2 refills | Status: DC

## 2020-10-25 NOTE — Progress Notes (Signed)
Virtual Visit via Telephone Note  I connected with Jeffrey [name] on 10/25/20 at  9:40 AM EST by telephone and verified that I am speaking with the correct person using two identifiers.  Location: Patient: home Provider: home   I discussed the limitations, risks, security and privacy concerns of performing an evaluation and management service by telephone and the availability of in person appointments. I also discussed with the patient that there may be a patient responsible charge related to this service. The patient expressed understanding and agreed to proceed.   I discussed the assessment and treatment plan with the patient. The patient was provided an opportunity to ask questions and all were answered. The patient agreed with the plan and demonstrated an understanding of the instructions.   The patient was advised to call back or seek an in-person evaluation if the symptoms worsen or if the condition fails to improve as anticipated.  I provided 15 minutes of non-face-to-face time during this encounter.   Diannia Ruder, MD  [facility] MD/PA/NP OP Progress Note  10/25/2020 9:55 AM Jeffrey [name]  MRN:  [id number]  Chief Complaint:  Chief Complaint    Depression; Anxiety; Manic Behavior; Follow-up     HPI: This this patient is a 59 year old white male who is currently living with his ex-wife in [place].  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  Unfortunately he was rehospitalized last month for severe respiratory distress.  He had to be intubated for short time and was in hospital for about 10 days.  He also suffered from delirium due to metabolic encephalopathy.  He seems to be doing better now that he has been out of the hospital about 2 weeks.  He is now on BiPAP.  He states that his mood has been stable he is not been significantly depressed.  His sleep is variable.  He is eating fairly well and has gained  about 10 pounds.  To his credit he has totally quit smoking.  He denies any agitation temper outburst hallucinations or racing thoughts.  His Depakote level last time was 17 but he claims that he is currently compliant with it. Visit Diagnosis:    ICD-10-CM   1. Moderate mixed bipolar I disorder (HCC)  F31.62     Past Psychiatric History: Several hospitalizations for depression, the last one in March 2019 for suicide attempt  Past Medical History:  Past Medical History:  Diagnosis Date  . Acute blood loss anemia 02/07/2017  . Anxiety   . Arthritis    deg disease, bulging disk,  shoulder level  . Bipolar disorder (HCC)   . Bipolar disorder (HCC)   . CHF (congestive heart failure) (HCC)   . 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 Depression    anxiety  . Hyperlipidemia   . Hypertension   . Peptic ulcer disease    Review  . Pneumonia   . [name] (peripheral vascular disease) (HCC) 06/18/2015  . Shortness of breath     Past Surgical History:  Procedure Laterality Date  . BIOPSY  02/09/2017   Procedure: BIOPSY;  Surgeon: [name], MD;  [facility];  Service: Endoscopy;;  duodenal gastric  . COLONOSCOPY  03/14/2007   [id number]:[id number] colonoscopy and terminal ileoscopy except external hemorrhoids  . COLONOSCOPY N/A 05/05/2013   [name]: external/internal anal canal hemorrhoids, unable to intubate TI, segemental biopsies unremarkable   . COLONOSCOPY  N/A 04/11/2017   Procedure: COLONOSCOPY;  Surgeon: [name], MD;  [facility];  Service: Endoscopy;  Laterality: N/A;  . COLONOSCOPY WITH PROPOFOL N/A 03/31/2017   Procedure: COLONOSCOPY WITH PROPOFOL;  Surgeon: [name], MD;  [facility];  Service: Endoscopy;  Laterality: N/A;  1:45pm  . ESOPHAGOGASTRODUODENOSCOPY  03/14/2007   [id number] antral gastritis with bulbar duodenitis/paucity to postbulbar duodenal folds and biopsy were benign with no evidence of villous atrophy.  .  ESOPHAGOGASTRODUODENOSCOPY (EGD) WITH PROPOFOL N/A 02/09/2017   Procedure: ESOPHAGOGASTRODUODENOSCOPY (EGD) WITH PROPOFOL;  Surgeon: [name], MD;  [facility];  Service: Endoscopy;  Laterality: N/A;  . GIVENS CAPSULE STUDY N/A 04/08/2017   Procedure: GIVENS CAPSULE STUDY;  Surgeon: [name], MD;  [facility]: [facility];  Service: Endoscopy;  Laterality: N/A;  . HAND SURGERY     left, secondary to self-inflicted laceration  . HEMORRHOID SURGERY N/A 04/14/2017   Procedure: HEMORRHOIDECTOMY;  Surgeon: [name], MD;  Location: [facility];  Service: General;  Laterality: N/A;  . SHOULDER SURGERY     right  . TOE SURGERY     left great toe , amputated-lawnmover accident    Family Psychiatric History: see below  Family History:  Family History  Problem Relation Age of Onset  . Breast cancer Mother        deceased  . Heart disease Father   . Depression Daughter   . Anxiety disorder Daughter   . Anxiety disorder Son   . Depression Son   . Asthma Brother   . Heart attack Maternal Aunt   . Heart attack Maternal Uncle   . Heart attack Paternal Aunt   . Heart attack Paternal Uncle   . Heart attack Maternal Grandmother   . Heart attack Maternal Grandfather   . Emphysema Maternal Grandfather   . Heart attack Paternal Grandmother   . Heart attack Paternal Grandfather   . Colon cancer Neg Hx   . Liver disease Neg Hx     Social History:  Social History   Socioeconomic History  . Marital status: Divorced    Spouse name: Not on file  . Number of children: 2  . Years of education: Not on file  . Highest education level: Not on file  Occupational History  . Occupation: Disabled  Tobacco Use  . Smoking status: Former Smoker    Packs/day: 1.00    Years: 40.00    Pack years: 40.00    Types: Cigarettes    Start date: 08/04/1971    Quit date: 09/30/2019    Years since quitting: 1.0  . Smokeless tobacco: Former Neurosurgeon    Quit date: 09/30/2019  . Tobacco comment:  peak rate of 2.5ppd, 1/2ppd on 11/27/2016 -- 6 cigarettes / day 01/25/18  Vaping Use  . Vaping Use: Never used  Substance and Sexual Activity  . Alcohol use: No    Alcohol/week: 0.0 standard drinks  . Drug use: Not Currently    Types: Marijuana    Comment: most days   . Sexual activity: Not Currently  Other Topics Concern  . Not on file  Social History Narrative   Originally from [place]. Previously has lived in [facility] [place] 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 Questionable asbestos exposure. Does have significant exposure to fumes. No mold exposure. No bird exposure. No pets currently.  Pt lives alone in [place].  Social Determinants of Health   Financial Resource Strain:   . Difficulty of Paying Living Expenses: Not on file  Food Insecurity:   . Worried About Programme researcher, broadcasting/film/video in the Last Year: Not on file  . Ran Out of Food in the Last Year: Not on file  Transportation Needs:   . Lack of Transportation (Medical): Not on file  . Lack of Transportation (Non-Medical): Not on file  Physical Activity:   . Days of Exercise per Week: Not on file  . Minutes of Exercise per Session: Not on file  Stress:   . Feeling of Stress : Not on file  Social Connections:   . Frequency of Communication with Friends and Family: Not on file  . Frequency of Social Gatherings with Friends and Family: Not on file  . Attends Religious Services: Not on file  . Active Member of Clubs or Organizations: Not on file  . Attends Banker Meetings: Not on file  . Marital Status: Not on file    Allergies:  Allergies  Allergen Reactions  . Augmentin [Amoxicillin-Pot Clavulanate] Rash and Other (See Comments)    Tolerated Zosyn 12/27/2019 Has patient had a PCN reaction causing immediate rash, facial/tongue/throat swelling, SOB or lightheadedness with hypotension: No Has patient had a PCN reaction causing severe rash involving mucus  membranes or skin necrosis: No Has patient had a PCN reaction that required hospitalization No Has patient had a PCN reaction occurring within the last 10 years: Yes If all of the above answers are "NO", then may proceed with Cephalosporin use.  . Ace Inhibitors Hives    Metabolic Disorder Labs: No results found for: HGBA1C, MPG No results found for: PROLACTIN Lab Results  Component Value Date   TRIG 101 10/02/2020   Lab Results  Component Value Date   TSH 1.552 05/01/2019   TSH 0.787 12/22/2018    Therapeutic Level Labs: No results found for: LITHIUM Lab Results  Component Value Date   VALPROATE 17 (L) 01/16/2020   VALPROATE 31 (L) 12/24/2019   No components found for:  CBMZ  Current Medications: Current Outpatient Medications  Medication Sig Dispense Refill  . acetaminophen (TYLENOL) 325 MG tablet Take 2 tablets (650 mg total) by mouth every 6 (six) hours as needed for mild pain, fever or headache (or Fever >/= 101). 12 tablet 0  . albuterol (PROVENTIL) (2.5 MG/3ML) 0.083% nebulizer solution Take 3 mLs (2.5 mg total) by nebulization every 4 (four) hours as needed for wheezing or shortness of breath. 75 mL 12  . albuterol (VENTOLIN HFA) 108 (90 Base) MCG/ACT inhaler Inhale 2 puffs into the lungs every 4 (four) hours as needed for wheezing or shortness of breath. 6.7 g 3  . aspirin EC 81 MG tablet Take 1 tablet (81 mg total) by mouth daily with breakfast. 30 tablet 4  . atorvastatin (LIPITOR) 20 MG tablet Take 1 tablet (20 mg total) by mouth daily. For high cholesterol (Patient taking differently: Take 20 mg by mouth every evening. For high cholesterol) 30 tablet 0  . diltiazem (CARDIZEM CD) 180 MG 24 hr capsule Take 180 mg by mouth daily.    . divalproex (DEPAKOTE ER) 500 MG 24 hr tablet Take 3 tablets (1,500 mg total) by mouth at bedtime. 90 tablet 2  . FLUoxetine (PROZAC) 20 MG capsule Take 1 capsule (20 mg total) by mouth daily. 30 capsule 2  . fluticasone (FLONASE) 50  MCG/ACT nasal spray Place 1 spray into both nostrils daily.    Marland Kitchen  Fluticasone-Umeclidin-Vilant (TRELEGY ELLIPTA) 100-62.5-25 MCG/INH AEPB Inhale 1 puff into the lungs daily. (Patient taking differently: Inhale 2 puffs into the lungs daily. ) 14 each 0  . folic acid (FOLVITE) 1 MG tablet Take 1 mg by mouth every morning.     . furosemide (LASIX) 40 MG tablet Take 40 mg by mouth daily.    Marland Kitchen. gabapentin (NEURONTIN) 300 MG capsule Take 2 capsules (600 mg total) by mouth 2 (two) times daily. 120 capsule 2  . guaiFENesin (MUCINEX) 600 MG 12 hr tablet Take 1 tablet (600 mg total) by mouth 2 (two) times daily as needed for cough or to loosen phlegm. 20 tablet 0  . metoprolol tartrate (LOPRESSOR) 25 MG tablet Take 0.5 tablets (12.5 mg total) by mouth 2 (two) times daily. 30 tablet 2  . Multiple Vitamin (MULTIVITAMIN WITH MINERALS) TABS tablet Take 1 tablet by mouth daily. (Patient taking differently: Take 1 tablet by mouth every morning. ) 30 tablet 0  . Multiple Vitamin (MULTIVITAMIN) tablet Take 1 tablet by mouth daily.    Marland Kitchen. OLANZapine (ZYPREXA) 10 MG tablet Take 1 tablet (10 mg total) by mouth 2 (two) times daily. 60 tablet 2  . oxymetazoline (AFRIN) 0.05 % nasal spray Place 1 spray into both nostrils daily.    . traZODone (DESYREL) 50 MG tablet Take 1 tablet (50 mg total) by mouth at bedtime. 30 tablet 2  . vitamin B-12 (CYANOCOBALAMIN) 500 MCG tablet Take 1,000 mcg by mouth every morning.      No current facility-administered medications for this visit.     Musculoskeletal: Strength & Muscle Tone: within normal limits Gait & Station: normal Patient leans: N/A  Psychiatric Specialty Exam: Review of Systems  Respiratory: Positive for shortness of breath.   All other systems reviewed and are negative.   There were no vitals taken for this visit.There is no height or weight on file to calculate BMI.  General Appearance: NA  Eye Contact:  NA  Speech:  Clear and Coherent  Volume:  Normal  Mood:   Euthymic  Affect:  NA  Thought Process:  Goal Directed  Orientation:  Full (Time, Place, and Person)  Thought Content: Rumination   Suicidal Thoughts:  No  Homicidal Thoughts:  No  Memory:  Immediate;   Good Recent;   Fair Remote;   Fair  Judgement:  Fair  Insight:  Shallow  Psychomotor Activity:  Decreased  Concentration:  Concentration: Fair and Attention Span: Fair  Recall:  [employer]Fair  Fund of Knowledge: Fair  Language: Good  Akathisia:  No  Handed:  Right  AIMS (if indicated): not done  Assets:  Communication Skills Desire for Improvement Resilience Social Support Talents/Skills  ADL's:  Intact  Cognition: WNL  Sleep:  Fair   Screenings: AIMS     [facility]  AIMS Total Score 0    AUDIT     [facility]  Alcohol Use Disorder Identification Test Final Score (AUDIT) 18       Assessment and Plan: Patient is a 59 year old male with a history of bipolar disorder polysubstance abuse in remission severe COPD and recent respiratory failure with metabolic encephalopathy.  Recent MRI showed no new changes.  His Ativan was discontinued in the hospital.  He will continue DepakoteER 1500 mg at bedtime for mood stabilization, Prozac 40 mg daily for depression, gabapentin 600 mg twice daily for anxiety and olanzapine 10  mg daily for mood stabilization.  He will also continue trazodone 50 mg at bedtime for sleep.  He will return to see me in 3 months   Diannia Ruder, MD 10/25/2020, 9:56 AM

## 2020-11-04 ENCOUNTER — Encounter: Payer: Self-pay | Admitting: Internal Medicine

## 2020-11-04 ENCOUNTER — Ambulatory Visit (HOSPITAL_COMMUNITY)
Admission: RE | Admit: 2020-11-04 | Discharge: 2020-11-04 | Disposition: A | Discharge status: Home / Self Care | Payer: Medicare Other | Source: Ambulatory Visit | Attending: Internal Medicine | Admitting: Internal Medicine

## 2020-11-04 ENCOUNTER — Other Ambulatory Visit: Payer: Self-pay

## 2020-11-04 ENCOUNTER — Ambulatory Visit: Payer: Medicare Other | Admitting: Internal Medicine

## 2020-11-04 DIAGNOSIS — J449 Chronic obstructive pulmonary disease, unspecified: Secondary | ICD-10-CM

## 2020-11-04 DIAGNOSIS — J9611 Chronic respiratory failure with hypoxia: Secondary | ICD-10-CM

## 2020-11-04 DIAGNOSIS — J9612 Chronic respiratory failure with hypercapnia: Secondary | ICD-10-CM

## 2020-11-04 MED ORDER — TRELEGY ELLIPTA 100-62.5-25 MCG/INH IN AEPB: 1.0000 | INHALATION_SPRAY | Freq: Every day | RESPIRATORY_TRACT | 0 refills | Status: DC

## 2020-11-04 NOTE — Progress Notes (Signed)
Jeffrey [name], male    DOB: 1961/03/12 .   MRN: [id number]   Brief patient profile:  59 yowm MM  Quit smoking 09/2019 at 5lpm 24/7 and not much better since self referred to [facility] clinic 03/29/2020 p prior rx by [facility]/ [facility]   PFT 10/09/16 >> FEV1 1.84 (49%), FEV1% 48, TLC 7.39 (105%), DLCO 36% A1AT 10/09/16 >> 215, MM Spirometry 02/02/17 >> FEV1 2.00 (55%), FEV1% 54     Admission date:  12/24/2019   Discharge Date:  01/10/2020   Primary MD  Gareth Morgan, MD   Discharge Diagnosis  Encephalopathy [G93.40] History of ETT [Z92.89] Acute on chronic respiratory failure with hypercapnia (HCC) [J96.22] Acute respiratory failure with hypoxia and hypercapnia (HCC) [J96.01, J96.02]   Active Problems:   Bipolar disorder with severe depression (HCC)   Essential hypertension   COPD with acute exacerbation (HCC)   Acute on chronic respiratory failure with hypercapnia (HCC)   Acute on chronic respiratory failure with hypoxia and hypercapnia (HCC)   Lobar pneumonia (HCC)   Acute metabolic encephalopathy   Acute on chronic diastolic CHF (congestive heart failure) (HCC)   Aspiration pneumonitis (HCC)        HPI  from the history and physical done on the day of admission:    Chief Complaint:AMS,difficulty breathing  HPI:  Per chart review patient was brought into the ED reports of difficulty breathing x 3 days, with confusion, and in the ED patient was tremulous.  Patient's ex-wife who now lives with patient tells me patient has not had any drink or use any drugs inthe past year. She reports increasing difficulty breathing and productive cough. No vomiting no loose stools. She said the past 3 days when patient was confused tells when he said he wanted someone to shoot him. She otherwise denies suicidal ideations. She also reports patient has gotten tired of using oxygen, his bronchodilators, and lying around as normally he likes to be active.  Admission11/19-  11/29for acute hypercapnic respiratory failure secondary to COPD exacerbation and component of CHF.RequiredPrecedex drip for agitation.Discharged home on 3 to 4 L nasal cannula which was his baseline.  ED Course:Tachypneic, O2 sats 91 to 95% on nasal cannula 4 L. But with worsening mental status, patient not responding, and thenagitated, pullingat lines and tubes, decision was made to intubate patient as patient would not tolerate BiPAP. And sedated with propofol. ABG showed pH of 7.3, marked elevated PCO2 of 107. PaO2 <31. Covid test negative. Portable chest x-ray shows left greater than right base airspace disease, aspiration orinfection favored. IV vancomycin and cefepime started for HCAP.     Hospital Course:    Brief Narrative: 59 year old male with history of COPD is chronically on 3 to 4 L of oxygen, admitted with worsening shortness of breath. Felt to be related to COPD exacerbation. He was intubated in the emergency room and admitted to the ICU. Fortunately, he was able to extubate on 12/26/19. Hospital course further complicated by development of acute metabolic encephalopathy. Etiology is unclear, but may be related to high-dose steroids. Required Precedex infusion. Steroids have been tapered off ---Mental status overall much improved,  -Patient is cooperative, talkative and mostly appropriate -Was able to wean off Precedex drip on 01/04/2020-  -Patient is medically stable for discharge - Transfer to SNF rehab   Assessment & Plan: 1)Acute on chronic respiratory failure with hypoxia-ABG suggested mild hypercapnia as well----- secondary to COPD exacerbation and aspiration pneumonia. Patient was intubated on admission (12/24/19), but was able  to extubate 12/26/19. Overall respiratory status appears to be stable. 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 -No longer requiring IV medications for sedation -Patient is cooperative, talkative and mostly appropriate -Was able to wean off Precedex drip on 01/04/2020-  -Patient with history of EtOH abuse, denies recent EtOH use -As per daughter Ms. 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 twice daily -May use as needed ODT Zyprexa --Occasional episodes of restlessness, patient is usually redirectable -- may need further adjustments of antipsychotic/psychiatric medications to optimize mood and behavior  3)Acute COPD exacerbation secondary to presumed aspiration pneumonia --- extubated 12/26/2019 , c =-ABG was suggestive of hypoxia and hypercapnia, completed antibiotics already, prednisone discontinued 01/04/2020  4)Bipolar Disorder- Continue home psychotropic medications including olanzapine and Depakote, also continue Prozac, lorazepam and trazodone,--- additional psych medication adjustments as above ----Mental status overall much improved,  -Not requiring IV medications for sedation -Patient is cooperative, talkative and mostly appropriate ----Occasional episodes of restlessness, patient is usually redirectable -- may need further adjustments of antipsychotic/psychiatric medications to optimize mood and behavior  5)Hypertension---.c/nmetoprolol to 12.5mg  twice daily,   6)Dysphonia/Dysphagia--- voice is a bit louder, patient voice is  soft/muffled apparently this is not new (initially worsened post  intubation) -Speech pathology eval appreciated recommends-Dysphagia 3 (mechanical soft); liquids have been changed to thin liquids as of 01/05/2020 from nectar-thick liquid  7)[name]--stable, continue atorvastatin and aspirin  8)Social/Ethics-- Discussed with Daughter [name] -[phone] [facility]), Pt is a Full code  9)Tobacco Abuse----smoking cessation        History of Present Illness  03/29/2020  Pulmonary/ 1st office eval/Jeffrey [name]  Chief Complaint  Patient presents with   Follow-up    sob with exertion.  sob with sitting still talking.  dry cough from time to time.  Dyspnea: Tol  Very little activity, MB maybe 346ft hasn't done x sev weeks prior to OV  Stop half way to catch breath  Cough: not much now/ minimal white mucus   Sleep: bed is flat / two pillows  SABA use: twice daily / neb 2x daily  02  5lpm at home with high 90's and then walk on 3lpm POC  rec Plan A = Automatic = Always=    Trelegy one click each am - take two good deep drags and out thru nose  Plan B = Backup (to supplement plan A, not to replace it) Only use your albuterol inhaler as a rescue medication to be used if you can't catch your breath by resting or doing a relaxed purse lip breathing pattern.  - The less you use it, the better it will work when you need it. - Ok to use the inhaler up to 2 puffs  every 4 hours if you must but call for appointment if use goes up over your usual need - Don't leave home without it !!  (think of it like the spare tire for your car)  Plan C = Crisis (instead of Plan B but only if Plan B stops working) -  only use your albuterol nebulizer if you first try Plan B and it fails to help > ok to use the nebulizer up to every 4 hours but if start needing it regularly call for immediate appointment Make sure you check your oxygen saturations at highest level of activity to be sure it stays over 90% (low 90s is perfect) and adjust upward to maintain this level if needed but remember to  turn it back to previous settings when you stop (to conserve your supply).  Please schedule a follow up office visit in 4 weeks, sooner if needed  with all respiratory  medications /inhalers/ solutions in hand so we can verify exactly what you are taking. This includes all medications from all doctors and over the counters Add cxr needed   Also added best fit for amb 02 order     04/26/2020  f/u ov/Jeffrey [name] re: GOLD II/III 02 on trelegy daily  S/p moderna Chief Complaint  Patient presents with   Follow-up    Breathing has improved some since the last visit. His voice is back to normal.   Dyspnea:  Shower is easier but not the mb and desats on 3lpm POC but not adjusting  Cough: very seldom  Sleeping: bed is flat / 2 pillows SABA use: once a day 02: 3lpm conc/ POC  3lpm walking but it will go to 5 and never checks sats  rec Plan A = Automatic = Always=    trelegy is first thing in am  One click and two good drags Plan B = Backup (to supplement plan A, not to replace it) Only use your albuterol (Ventolin)inhaler as a rescue medication  Plan C = Crisis (instead of Plan B but only if Plan B stops working) - only use your albuterol nebulizer if you first try Plan B and it fails to help    07/31/2020  f/u ov/[place] office/Jeffrey [name] re: GOLD II/III 02 on trelegy daily Chief Complaint  Patient presents with   Follow-up   Dyspnea: MMRC3 = can't walk 100 yards even at a slow pace at a flat grade s stopping due to sob Cough: some rattling thick mucus/  Sleeping: bed is flat / 3 pillows  SABA use: still sev times per day hfa but not neb  02: 4lpm 24/7  rec Prednisone 10 mg take  4 each am x 2 days,   2 each am x 2 days,  1 each am x 2 days and stop  I emphasized that nasal steroids (flonase/nasacort)  have no immediate benefit  Recommend the booster of moderna when it is offered probably this fall  The key is to stop smoking completely before smoking completely stops you! For cough congestion >  mucinex dm 1200 mg every 12 hours is the maximum dose  Make sure you check your oxygen saturations at highest level of activity  Please schedule a follow up visit in 3 months but call sooner if needed   See admit  Admit date: 09/29/2020 Discharge date: 10/09/2020  Admitted From: Home  Discharge disposition: Home with PT  Recommendations for Outpatient Follow-Up:    Follow up with your primary care provider in one week.   Check CBC, BMP, magnesium in the next visit  Patient has been considered for trilogy at home.  Discharge Diagnosis:   Active Problems:   Dyslipidemia   COPD exacerbation (HCC)   Essential hypertension   GERD (gastroesophageal reflux disease)   Acute on chronic respiratory failure with hypoxia and hypercapnia (  HCC)   Acute metabolic encephalopathy   Lactic acidosis   Hyponatremia   Hyperglycemia   Elevated brain natriuretic peptide (BNP) level   (HFpEF) heart failure with preserved ejection fraction (HCC)   DNR (do not resuscitate) discussion   Pressure injury of skin   Discharge Condition: Improved.  Diet recommendation: Low sodium, heart healthy.    Wound care: None.  Code status: Full.   History of Present Illness:   59 year  old male with past medical history of COPD on 3 to 4 L of oxygen at home, depression, bipolar disorder, chronic diastolic heart failure, hepatic encephalopathy and polysubstance abuse was brought into the hospital with respiratory distress.  Patient was then admitted hospital for acute hypoxic and hypercarbic respiratory failure requiring intubation.  Patient was initially at [facility] and was extubated after which he was put on BiPAP.  Hospital course was complicated by metabolic encephalopathy requiring Precedex drip including Ativan and fentanyl.  Patient was subsequently transferred from any pain to [facility] ICU since her family wished aggressive care.  Hospital Course:   Following  conditions were addressed during hospitalization as listed below,  Acute on chronic hypoxic and hypercarbic respiratory failure secondary to COPD exacerbation Improved clinically.  Initially was on mechanical ventilation followed by BiPAP.  At this time patient has been assessed for trilogy on discharge.  Will be continued on oxygen supplementation discharge as well.  Patient has completed antibiotic course.    Pulmonary edema/Acute on chronic diastolic CHF Received IV Lasix with improvement.  Currently evolving.  Acute metabolic encephalopathy This was likely multifactorial, hypoxia, ICU delirium, steroids, withdrawal from benzos.  Improved during hospitalization.  Initially required Precedex drip.  Subsequently was restarted on valproic acid and Prozac from home.  Hypokalemia Replenished.  Anemia of critical illness and chronic disease Remained stable.  Bipolar disorder/Depression -Restarted on valproic acid and Prozac.   11/04/2020  f/u ov/[place] office/Jeffrey [name] re:  COPD with GOLD III    spirometry / 02 dep @ 24/7 - says off cigs x one year Chief Complaint  Patient presents with   Follow-up    Shortness of breath activity  Dyspnea: 50 - 100 ft  Cough: none  Sleeping: flat / 3 pillows  SABA use: none  02:  4llpm 24/7 / cpap per PCP    No obvious day to day or daytime variability or assoc excess/ purulent sputum or mucus plugs or hemoptysis or cp or chest tightness, subjective wheeze or overt sinus or hb symptoms.   Sleeping now as above  without nocturnal  or early am exacerbation  of respiratory  c/o's or need for noct saba. Also denies any obvious fluctuation of symptoms with weather or environmental changes or other aggravating or alleviating factors except as outlined above   No unusual exposure hx or h/o childhood pna/ asthma or knowledge of premature birth.  Current Allergies, Complete Past Medical History, Past Surgical History, Family History, and Social  History were reviewed in [employer] record.  ROS  The following are not active complaints unless bolded Hoarseness, sore throat, dysphagia, dental problems, itching, sneezing,  nasal congestion or discharge of excess mucus or purulent secretions, ear ache,   fever, chills, sweats, unintended wt loss or wt gain, classically pleuritic or exertional cp,  orthopnea pnd or arm/hand swelling  or leg swelling, presyncope, palpitations, abdominal pain, anorexia, nausea, vomiting, diarrhea  or change in bowel habits or change in bladder habits, change in stools or change  in urine, dysuria, hematuria,  rash, arthralgias, visual complaints, headache, numbness, weakness or ataxia or problems with walking or coordination,  change in mood or  memory.        Current Meds  Medication Sig   acetaminophen (TYLENOL) 325 MG tablet Take 2 tablets (650 mg total) by mouth every 6 (six) hours as needed for mild pain, fever or headache (or Fever >/= 101).   albuterol (PROVENTIL) (2.5 MG/3ML) 0.083% nebulizer solution Take 3 mLs (2.5 mg total) by nebulization every 4 (four) hours as needed for wheezing or shortness of breath.   albuterol (VENTOLIN HFA) 108 (90 Base) MCG/ACT inhaler Inhale 2 puffs into the lungs every 4 (four) hours as needed for wheezing or shortness of breath.   aspirin EC 81 MG tablet Take 1 tablet (81 mg total) by mouth daily with breakfast.   atorvastatin (LIPITOR) 20 MG tablet Take 1 tablet (20 mg total) by mouth daily. For high cholesterol (Patient taking differently: Take 20 mg by mouth every evening. For high cholesterol)   diltiazem (CARDIZEM CD) 180 MG 24 hr capsule Take 180 mg by mouth daily.   divalproex (DEPAKOTE ER) 500 MG 24 hr tablet Take 3 tablets (1,500 mg total) by mouth at bedtime.   FLUoxetine (PROZAC) 20 MG capsule Take 1 capsule (20 mg total) by mouth daily.   fluticasone (FLONASE) 50 MCG/ACT nasal spray Place 1 spray into both nostrils daily.    Fluticasone-Umeclidin-Vilant (TRELEGY ELLIPTA) 100-62.5-25 MCG/INH AEPB Inhale 1 puff into the lungs daily. (Patient taking differently: Inhale 2 puffs into the lungs daily.)   folic acid (FOLVITE) 1 MG tablet Take 1 mg by mouth every morning.    furosemide (LASIX) 40 MG tablet Take 40 mg by mouth daily.   gabapentin (NEURONTIN) 300 MG capsule Take 2 capsules (600 mg total) by mouth 2 (two) times daily.   guaiFENesin (MUCINEX) 600 MG 12 hr tablet Take 1 tablet (600 mg total) by mouth 2 (two) times daily as needed for cough or to loosen phlegm.   metoprolol tartrate (LOPRESSOR) 25 MG tablet Take 0.5 tablets (12.5 mg total) by mouth 2 (two) times daily.   Multiple Vitamin (MULTIVITAMIN WITH MINERALS) TABS tablet Take 1 tablet by mouth daily. (Patient taking differently: Take 1 tablet by mouth every morning.)   Multiple Vitamin (MULTIVITAMIN) tablet Take 1 tablet by mouth daily.   OLANZapine (ZYPREXA) 10 MG tablet Take 1 tablet (10 mg total) by mouth 2 (two) times daily.   oxymetazoline (AFRIN) 0.05 % nasal spray Place 1 spray into both nostrils daily.   potassium chloride SA (KLOR-CON) 20 MEQ tablet Take 20 mEq by mouth daily.   propranolol (INDERAL) 10 MG tablet Take 10 mg by mouth 2 (two) times daily.   traZODone (DESYREL) 50 MG tablet Take 1 tablet (50 mg total) by mouth at bedtime.   vitamin B-12 (CYANOCOBALAMIN) 500 MCG tablet Take 1,000 mcg by mouth every morning.              Past Medical History:  Diagnosis Date   Acute blood loss anemia 02/07/2017   Anxiety    Arthritis    deg disease, bulging disk,  shoulder level   Bipolar disorder (HCC)    Bipolar disorder (HCC)    CHF (congestive heart failure) (HCC)    COPD, severe (HCC) 10/09/2016   Depression    anxiety   Hyperlipidemia    Hypertension    Peptic ulcer disease    Review   Pneumonia  [name] (peripheral vascular disease) (HCC) 06/18/2015   Shortness of breath            Objective:      11/04/2020    178   04/26/2020        181   03/29/20 170 lb (77.1 kg)  01/16/20 143 lb 4.8 oz (65 kg)  01/10/20 143 lb 11.8 oz (65.2 kg)      Vital signs reviewed  11/04/2020  - Note at rest 02 sats  97% on 4lpm POC     amb chronically ill appearing wm wearing his pj's  HEENT : pt wearing mask not removed for exam due to covid -19 concerns.    NECK :  without JVD/Nodes/TM/ nl carotid upstrokes bilaterally   LUNGS: no acc muscle use,  Mod barrel  contour chest wall with bilateral  Distant bs s audible wheeze and  without cough on insp or exp maneuvers and mod  Hyperresonant  to  percussion bilaterally     CV:  RRR  no s3 or murmur or increase in P2, and no edema   ABD:  soft and nontender with pos mid insp Hoover's  in the supine position. No bruits or organomegaly appreciated, bowel sounds nl  MS:     ext warm without deformities, calf tenderness, cyanosis or clubbing No obvious joint restrictions   SKIN: warm and dry without lesions    NEURO:  alert, approp, nl sensorium with  no motor or cerebellar deficits apparent.          CXR PA and Lateral:   11/04/2020 :    I personally reviewed images and  impression as follows:   Copd with marked improvement acute as dz bilaterally, still mild increase in nonspecific markings              Assessment

## 2020-11-04 NOTE — Patient Instructions (Addendum)
Plan A = Automatic = Always=    Trelegy one click each am   Plan B = Backup (to supplement plan A, not to replace it) Only use your albuterol inhaler as a rescue medication to be used if you can't catch your breath by resting or doing a relaxed purse lip breathing pattern.  - The less you use it, the better it will work when you need it. - Ok to use the inhaler up to 2 puffs  every 4 hours if you must but call for appointment if use goes up over your usual need - Don't leave home without it !!  (think of it like the spare tire for your car)   Plan C = Crisis (instead of Plan B but only if Plan B stops working) - only use your albuterol nebulizer if you first try Plan B and it fails to help > ok to use the nebulizer up to every 4 hours but if start needing it regularly call for immediate appointment  Please remember to go to the  x-ray department  @  [facility] for your tests - we will call you with the results when they are available      Please schedule a follow up visit in 6 months but call sooner if needed

## 2020-11-04 NOTE — Assessment & Plan Note (Signed)
HC03 level  01/16/20  = 32  -  03/29/2020  Walked slow pace on 5lpm POC with sats starting at 92 and dropping to 82%  -  07/31/2020   Walked RA  approx   100 ft  @ slow pace  stopped due to  Sob on 4lpm but sats still 93%   -  HC03  10/07/20  =  31    as of  11/04/2020   rx is 4lpm 24 / 7    Advised based on Data from [place] needs booster 6 m p 2nd covid shot.          Each maintenance medication was reviewed in detail including emphasizing most importantly the difference between maintenance and prns and under what circumstances the prns are to be triggered using an action plan format where appropriate.  Total time for H and P, chart review, counseling reviewing device and generating customized AVS unique to this post hosp f/u office visit / charting = 36 min

## 2020-11-04 NOTE — Assessment & Plan Note (Signed)
Quit smoking 2020 PFT 10/09/16 >> FEV1 1.84 (49%), FEV1% 48, TLC 7.39 (105%), DLCO 36% A1AT 10/09/16 >> 215, MM Spirometry 02/02/17 >> FEV1 2.00 (55%), FEV1% 54 - 03/29/2020  After extensive coaching inhaler device,  effectiveness =    90% with elipta, 75% with hfa > trelegy  >>  improved 04/26/2020      Group D in terms of symptom/risk and laba/lama/ICS  therefore appropriate rx at this point >>>  Continue trelegy and approp saba  I spent extra time with pt today reviewing appropriate use of albuterol for prn use on exertion with the following points: 1) saba is for relief of sob that does not improve by walking a slower pace or resting but rather if the pt does not improve after trying this first. 2) If the pt is convinced, as many are, that saba helps recover from activity faster then it's easy to tell if this is the case by re-challenging : ie stop, take the inhaler, then p 5 minutes try the exact same activity (intensity of workload) that just caused the symptoms and see if they are substantially diminished or not after saba 3) if there is an activity that reproducibly causes the symptoms, try the saba 15 min before the activity on alternate days   If in fact the saba really does help, then fine to continue to use it prn but advised may need to look closer at the maintenance regimen being used to achieve better control of airways disease with exertion.

## 2020-11-05 ENCOUNTER — Encounter: Payer: Self-pay | Admitting: *Deleted

## 2021-01-03 ENCOUNTER — Emergency Department (HOSPITAL_COMMUNITY)
Admission: EM | Admit: 2021-01-03 | Discharge: 2021-01-03 | Disposition: A | Discharge status: Home / Self Care | Payer: Medicare Other | Attending: Emergency Medicine | Admitting: Emergency Medicine

## 2021-01-03 ENCOUNTER — Emergency Department (HOSPITAL_COMMUNITY): Payer: Medicare Other

## 2021-01-03 ENCOUNTER — Encounter (HOSPITAL_COMMUNITY): Payer: Self-pay | Admitting: Emergency Medicine

## 2021-01-03 ENCOUNTER — Other Ambulatory Visit: Payer: Self-pay

## 2021-01-03 DIAGNOSIS — Z87891 Personal history of nicotine dependence: Secondary | ICD-10-CM | POA: Diagnosis not present

## 2021-01-03 DIAGNOSIS — Z7982 Long term (current) use of aspirin: Secondary | ICD-10-CM | POA: Diagnosis not present

## 2021-01-03 DIAGNOSIS — I5033 Acute on chronic diastolic (congestive) heart failure: Secondary | ICD-10-CM | POA: Insufficient documentation

## 2021-01-03 DIAGNOSIS — I110 Hypertensive heart disease with heart failure: Secondary | ICD-10-CM | POA: Insufficient documentation

## 2021-01-03 DIAGNOSIS — Z20822 Contact with and (suspected) exposure to covid-19: Secondary | ICD-10-CM | POA: Diagnosis not present

## 2021-01-03 DIAGNOSIS — Z79899 Other long term (current) drug therapy: Secondary | ICD-10-CM | POA: Insufficient documentation

## 2021-01-03 DIAGNOSIS — R0602 Shortness of breath: Secondary | ICD-10-CM | POA: Diagnosis present

## 2021-01-03 DIAGNOSIS — J441 Chronic obstructive pulmonary disease with (acute) exacerbation: Secondary | ICD-10-CM | POA: Insufficient documentation

## 2021-01-03 LAB — CBC WITH DIFFERENTIAL/PLATELET
Abs Immature Granulocytes: 0.02 10*3/uL (ref 0.00–0.07)
Basophils Absolute: 0 10*3/uL (ref 0.0–0.1)
Basophils Relative: 1 %
Eosinophils Absolute: 0 10*3/uL (ref 0.0–0.5)
Eosinophils Relative: 0 %
HCT: 40.1 % (ref 39.0–52.0)
Hemoglobin: 12.7 g/dL — ABNORMAL LOW (ref 13.0–17.0)
Immature Granulocytes: 0 %
Lymphocytes Relative: 14 %
Lymphs Abs: 1 10*3/uL (ref 0.7–4.0)
MCH: 28.5 pg (ref 26.0–34.0)
MCHC: 31.7 g/dL (ref 30.0–36.0)
MCV: 89.9 fL (ref 80.0–100.0)
Monocytes Absolute: 0.7 10*3/uL (ref 0.1–1.0)
Monocytes Relative: 10 %
Neutro Abs: 5.2 10*3/uL (ref 1.7–7.7)
Neutrophils Relative %: 75 %
Platelets: 322 10*3/uL (ref 150–400)
RBC: 4.46 MIL/uL (ref 4.22–5.81)
RDW: 12.2 % (ref 11.5–15.5)
WBC: 7 10*3/uL (ref 4.0–10.5)
nRBC: 0 % (ref 0.0–0.2)

## 2021-01-03 LAB — BASIC METABOLIC PANEL
Anion gap: 10 (ref 5–15)
BUN: 11 mg/dL (ref 6–20)
CO2: 44 mmol/L — ABNORMAL HIGH (ref 22–32)
Calcium: 9.5 mg/dL (ref 8.9–10.3)
Chloride: 83 mmol/L — ABNORMAL LOW (ref 98–111)
Creatinine, Ser: 0.66 mg/dL (ref 0.61–1.24)
GFR, Estimated: >60 mL/min (ref >60)
Glucose, Bld: 98 mg/dL (ref 70–99)
Potassium: 3.7 mmol/L (ref 3.5–5.1)
Sodium: 137 mmol/L (ref 135–145)

## 2021-01-03 LAB — BRAIN NATRIURETIC PEPTIDE: B Natriuretic Peptide: 141 pg/mL — ABNORMAL HIGH (ref 0.0–100.0)

## 2021-01-03 LAB — RESP PANEL BY RT-PCR (FLU A&B, COVID) ARPGX2
Influenza A by PCR: NEGATIVE
Influenza B by PCR: NEGATIVE
SARS Coronavirus 2 by RT PCR: NEGATIVE

## 2021-01-03 MED ORDER — IPRATROPIUM BROMIDE 0.02 % IN SOLN
0.5000 mg | Freq: Once | RESPIRATORY_TRACT | Status: AC
  Administered 2021-01-03: 0.5 mg via RESPIRATORY_TRACT
  Filled 2021-01-03: qty 2.5

## 2021-01-03 MED ORDER — METHYLPREDNISOLONE 4 MG PO TBPK: ORAL_TABLET | ORAL | 0 refills | Status: DC

## 2021-01-03 MED ORDER — ALBUTEROL SULFATE HFA 108 (90 BASE) MCG/ACT IN AERS
8.0000 | INHALATION_SPRAY | Freq: Once | RESPIRATORY_TRACT | Status: AC
  Administered 2021-01-03: 8 via RESPIRATORY_TRACT
  Filled 2021-01-03: qty 6.7

## 2021-01-03 MED ORDER — ALBUTEROL SULFATE (2.5 MG/3ML) 0.083% IN NEBU
5.0000 mg | INHALATION_SOLUTION | Freq: Once | RESPIRATORY_TRACT | Status: AC
  Administered 2021-01-03: 5 mg via RESPIRATORY_TRACT
  Filled 2021-01-03: qty 6

## 2021-01-03 MED ORDER — METHYLPREDNISOLONE SODIUM SUCC 125 MG IJ SOLR
125.0000 mg | Freq: Once | INTRAMUSCULAR | Status: AC
  Administered 2021-01-03: 125 mg via INTRAVENOUS
  Filled 2021-01-03: qty 2

## 2021-01-03 MED ORDER — MAGNESIUM SULFATE 2 GM/50ML IV SOLN
2.0000 g | INTRAVENOUS | Status: AC
  Administered 2021-01-03: 2 g via INTRAVENOUS
  Filled 2021-01-03: qty 50

## 2021-01-03 NOTE — ED Notes (Addendum)

## 2021-01-03 NOTE — ED Notes (Addendum)
While away from bedside, this RN educated by patient advocate that patient was outside of the room, in a chair.  This RN found patient to be sitting outside the room, on a chair, oxygen tubing in hand. Pt states to this RN "What is going on? They told me I could go and I'm ready to leave."  This RN educated on discharge process, assisted patient back to room in chair and placed back on 4L [place].  Door remains open and light on at this time.

## 2021-01-03 NOTE — ED Provider Notes (Signed)
[facility] EMERGENCY DEPARTMENT Provider Note   CSN: [id number] Arrival date & time: 01/03/21  8250     History Chief Complaint  Patient presents with  . Shortness of Breath    Jeffrey [name] is a 60 y.o. male with a past medical history significant for CHF, COPD, peripheral vascular disease, chronic hypoxic respiratory failure on 4 L who presents with shortness of breath.  Patient states that he has had worsening breathing since yesterday evening when he was trying to scrub something off of the floor.  He states that he has been having wheezing, shortness of breath.  He has been using his at home medications without relief of his symptoms.  He was given albuterol by EMS prior to arrival with improvement in his symptoms but he still feels quite short of breath and not back to baseline.  Patient lost his voice over the last week.  He denies cold symptoms but does state that he was sweating a lot last night.  Is not sure if this is due to chills or work of breathing.  He denies chest pain.  Patient found to be 82% on his baseline 4 L prior to arrival in the emergency department.  He is now satting 100% on his 4 L.  HPI     Past Medical History:  Diagnosis Date  . Acute blood loss anemia 02/07/2017  . Anxiety   . Arthritis    deg disease, bulging disk,  shoulder level  . Bipolar disorder (HCC)   . Bipolar disorder (HCC)   . CHF (congestive heart failure) (HCC)   . 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 Depression    anxiety  . Hyperlipidemia   . Hypertension   . Peptic ulcer disease    Review  . Pneumonia   . [name] (peripheral vascular disease) (HCC) 06/18/2015  . Shortness of breath     Patient Active Problem List   Diagnosis Date Noted  . Pressure injury of skin 10/05/2020  . DNR (do not resuscitate) discussion   . Acute respiratory failure with hypercapnia (HCC) 09/29/2020  . Lactic acidosis 09/29/2020  . Hyponatremia 09/29/2020  . Hyperglycemia 09/29/2020  . Elevated brain  natriuretic peptide (BNP) level 09/29/2020  . (HFpEF) heart failure with preserved ejection fraction (HCC) 09/29/2020  . Rhinitis, chronic 08/01/2020  . Chronic respiratory failure with hypoxia and hypercapnia (HCC) 03/30/2020  . Acute on chronic diastolic CHF (congestive heart failure) (HCC) 12/26/2019  . Aspiration pneumonitis (HCC) 12/26/2019  . Acute on chronic respiratory failure with hypoxia and hypercapnia (HCC) 12/25/2019  . Lobar pneumonia (HCC) 12/25/2019  . Acute metabolic encephalopathy 12/25/2019  . Acute on chronic respiratory failure with hypercapnia (HCC) 12/24/2019  . COPD with acute exacerbation (HCC) 10/12/2019  . Respiratory failure with hypoxia (HCC) 04/30/2019  . Acute on chronic respiratory failure with hypoxia (HCC) 04/30/2019  . Bronchiectasis (HCC) 03/24/2019  . Fever 03/24/2019  . Cough   . Dysphagia   . Palliative care by specialist   . Goals of care, counseling/discussion   . Hepatic encephalopathy (HCC)   . COPD exacerbation (HCC) 09/16/2018  . Essential hypertension 09/16/2018  . GERD (gastroesophageal reflux disease) 09/16/2018  . Hypoxia   . Chronic diastolic CHF (congestive heart failure) (HCC)   . Bipolar disorder with severe depression (HCC) 02/01/2018  . Alcohol use disorder, severe, dependence (HCC) 02/01/2018  . Cannabis use disorder, severe, dependence (HCC) 02/01/2018  . MDD (major depressive disorder), recurrent episode, severe (HCC) 01/31/2018  . Internal and  external bleeding hemorrhoids   . Rectal bleeding   . Iron deficiency anemia due to chronic blood loss 03/09/2017  . Anemia   . GIB (gastrointestinal bleeding) 02/07/2017  . Peptic ulcer disease   . Bipolar disorder (HCC)   . 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 H/O: depression 08/26/2016  . Dyslipidemia 08/26/2016  . Cigarette smoker 08/26/2016  . [name] (peripheral vascular disease) (HCC) 06/18/2015  . Chronic diarrhea 05/02/2013  . Hematochezia 05/02/2013  . Abnormal  weight loss 05/02/2013  . Abdominal pain, periumbilical 05/02/2013    Past Surgical History:  Procedure Laterality Date  . BIOPSY  02/09/2017   Procedure: BIOPSY;  Surgeon: [name], MD;  Location: [facility];  Service: Endoscopy;;  duodenal gastric  . COLONOSCOPY  03/14/2007   [id number]:[id number] colonoscopy and terminal ileoscopy except external hemorrhoids  . COLONOSCOPY N/A 05/05/2013   [name]: external/internal anal canal hemorrhoids, unable to intubate TI, segemental biopsies unremarkable   . COLONOSCOPY N/A 04/11/2017   Procedure: COLONOSCOPY;  Surgeon: [name], MD;  Location: [facility];  Service: Endoscopy;  Laterality: N/A;  . COLONOSCOPY WITH PROPOFOL N/A 03/31/2017   Procedure: COLONOSCOPY WITH PROPOFOL;  Surgeon: [name], MD;  Location: [facility];  Service: Endoscopy;  Laterality: N/A;  1:45pm  . ESOPHAGOGASTRODUODENOSCOPY  03/14/2007   [id number] antral gastritis with bulbar duodenitis/paucity to postbulbar duodenal folds and biopsy were benign with no evidence of villous atrophy.  . ESOPHAGOGASTRODUODENOSCOPY (EGD) WITH PROPOFOL N/A 02/09/2017   Procedure: ESOPHAGOGASTRODUODENOSCOPY (EGD) WITH PROPOFOL;  Surgeon: [name], MD;  Location: [facility];  Service: Endoscopy;  Laterality: N/A;  . GIVENS CAPSULE STUDY N/A 04/08/2017   Procedure: GIVENS CAPSULE STUDY;  Surgeon: [name], MD;  Location: [facility];  Service: Endoscopy;  Laterality: N/A;  . HAND SURGERY     left, secondary to self-inflicted laceration  . HEMORRHOID SURGERY N/A 04/14/2017   Procedure: HEMORRHOIDECTOMY;  Surgeon: [name], MD;  Location: [facility];  Service: General;  Laterality: N/A;  . SHOULDER SURGERY     right  . TOE SURGERY     left great toe , amputated-lawnmover accident       Family History  Problem Relation Age of Onset  . Breast cancer Mother        deceased  . Heart disease Father   . Depression Daughter   . Anxiety disorder Daughter    . Anxiety disorder Son   . Depression Son   . Asthma Brother   . Heart attack Maternal Aunt   . Heart attack Maternal Uncle   . Heart attack Paternal Aunt   . Heart attack Paternal Uncle   . Heart attack Maternal Grandmother   . Heart attack Maternal Grandfather   . Emphysema Maternal Grandfather   . Heart attack Paternal Grandmother   . Heart attack Paternal Grandfather   . Colon cancer Neg Hx   . Liver disease Neg Hx     Social History   Tobacco Use  . Smoking status: Former Smoker    Packs/day: 1.00    Years: 40.00    Pack years: 40.00    Types: Cigarettes    Start date: 08/04/1971    Quit date: 09/30/2019    Years since quitting: 1.2  . Smokeless tobacco: Former Neurosurgeon    Quit date: 09/30/2019  . Tobacco comment: peak rate of 2.5ppd, 1/2ppd on 11/27/2016 -- 6 cigarettes / day 01/25/18  Vaping Use  .  Vaping Use: Never used  Substance Use Topics  . Alcohol use: No    Alcohol/week: 0.0 standard drinks  . Drug use: Not Currently    Types: Marijuana    Comment: most days     Home Medications Prior to Admission medications   Medication Sig Start Date End Date Taking? Authorizing Provider  acetaminophen (TYLENOL) 325 MG tablet Take 2 tablets (650 mg total) by mouth every 6 (six) hours as needed for mild pain, fever or headache (or Fever >/= 101). 01/10/20   Shon Hale, MD  albuterol (PROVENTIL) (2.5 MG/3ML) 0.083% nebulizer solution Take 3 mLs (2.5 mg total) by nebulization every 4 (four) hours as needed for wheezing or shortness of breath. 03/29/20   Nyoka Cowden, MD  albuterol (VENTOLIN HFA) 108 (90 Base) MCG/ACT inhaler Inhale 2 puffs into the lungs every 4 (four) hours as needed for wheezing or shortness of breath. 12/04/19   Eber Hong, MD  aspirin EC 81 MG tablet Take 1 tablet (81 mg total) by mouth daily with breakfast. 01/10/20   Shon Hale, MD  atorvastatin (LIPITOR) 20 MG tablet Take 1 tablet (20 mg total) by mouth daily. For high cholesterol Patient  taking differently: Take 20 mg by mouth every evening. For high cholesterol 02/09/18   Money, Gerlene Burdock, FNP  diltiazem (CARDIZEM CD) 180 MG 24 hr capsule Take 180 mg by mouth daily. 12/25/19   [provider]  divalproex (DEPAKOTE ER) 500 MG 24 hr tablet Take 3 tablets (1,500 mg total) by mouth at bedtime. 10/25/20   Myrlene Broker, MD  FLUoxetine (PROZAC) 20 MG capsule Take 1 capsule (20 mg total) by mouth daily. 10/25/20   Myrlene Broker, MD  fluticasone (FLONASE) 50 MCG/ACT nasal spray Place 1 spray into both nostrils daily.    [provider]  Fluticasone-Umeclidin-Vilant (TRELEGY ELLIPTA) 100-62.5-25 MCG/INH AEPB Inhale 1 puff into the lungs daily. Patient taking differently: Inhale 2 puffs into the lungs daily. 03/29/20   Nyoka Cowden, MD  Fluticasone-Umeclidin-Vilant (TRELEGY ELLIPTA) 100-62.5-25 MCG/INH AEPB Inhale 1 puff into the lungs daily. 11/04/20   Nyoka Cowden, MD  folic acid (FOLVITE) 1 MG tablet Take 1 mg by mouth every morning.  11/29/19   [provider]  furosemide (LASIX) 40 MG tablet Take 40 mg by mouth daily. 12/25/19   [provider]  gabapentin (NEURONTIN) 300 MG capsule Take 2 capsules (600 mg total) by mouth 2 (two) times daily. 10/25/20   Myrlene Broker, MD  guaiFENesin (MUCINEX) 600 MG 12 hr tablet Take 1 tablet (600 mg total) by mouth 2 (two) times daily as needed for cough or to loosen phlegm. 01/10/20   Shon Hale, MD  methylPREDNISolone (MEDROL DOSEPAK) 4 MG TBPK tablet Use as directed 01/03/21   Arthor Captain, PA-C  metoprolol tartrate (LOPRESSOR) 25 MG tablet Take 0.5 tablets (12.5 mg total) by mouth 2 (two) times daily. 01/10/20   Shon Hale, MD  Multiple Vitamin (MULTIVITAMIN WITH MINERALS) TABS tablet Take 1 tablet by mouth daily. Patient taking differently: Take 1 tablet by mouth every morning. 01/13/19   Edsel Petrin, DO  Multiple Vitamin (MULTIVITAMIN) tablet Take 1 tablet by mouth daily.    [provider]  OLANZapine (ZYPREXA) 10 MG tablet Take 1 tablet (10 mg total) by mouth 2 (two) times daily. 10/25/20 10/25/21  Myrlene Broker, MD  oxymetazoline (AFRIN) 0.05 % nasal spray Place 1 spray into both nostrils daily.    [provider]  potassium  chloride SA (KLOR-CON) 20 MEQ tablet Take 20 mEq by mouth daily. 10/08/20   [provider]  propranolol (INDERAL) 10 MG tablet Take 10 mg by mouth 2 (two) times daily. 10/08/20   [provider]  traZODone (DESYREL) 50 MG tablet Take 1 tablet (50 mg total) by mouth at bedtime. 10/25/20   Myrlene Broker, MD  vitamin B-12 (CYANOCOBALAMIN) 500 MCG tablet Take 1,000 mcg by mouth every morning.     [provider]    Allergies    Augmentin [amoxicillin-pot clavulanate] and Ace inhibitors  Review of Systems   Review of Systems Ten systems reviewed and are negative for acute change, except as noted in the HPI.   Physical Exam Updated Vital Signs BP 129/71 (BP Location: Left Arm)   Pulse 96   Temp 98.7 F (37.1 C) (Oral)   Resp (!) 24   SpO2 100%   Physical Exam Vitals and nursing note reviewed.  Constitutional:      General: He is not in acute distress.    Appearance: He is well-developed and well-nourished. He is not diaphoretic.  HENT:     Head: Normocephalic and atraumatic.  Eyes:     General: No scleral icterus.    Conjunctiva/sclera: Conjunctivae normal.  Cardiovascular:     Rate and Rhythm: Regular rhythm. Tachycardia present.     Heart sounds: Normal heart sounds.  Pulmonary:     Effort: Accessory muscle usage and prolonged expiration present. No respiratory distress.     Breath sounds: Decreased air movement present. Examination of the right-upper field reveals wheezing. Examination of the left-upper field reveals wheezing. Examination of the right-middle field reveals wheezing and rales. Examination of the left-middle field reveals wheezing and rales. Examination of the right-lower field reveals  wheezing and rales. Examination of the left-lower field reveals wheezing and rales. Wheezing and rales present.  Abdominal:     Palpations: Abdomen is soft.     Tenderness: There is no abdominal tenderness.  Musculoskeletal:        General: No edema.     Cervical back: Normal range of motion and neck supple.     Right lower leg: No edema.     Left lower leg: No edema.  Skin:    General: Skin is warm and dry.  Neurological:     Mental Status: He is alert.  Psychiatric:        Behavior: Behavior normal.     ED Results / Procedures / Treatments   Labs (all labs ordered are listed, but only abnormal results are displayed) Labs Reviewed  BASIC METABOLIC PANEL - Abnormal; Notable for the following components:      Result Value   Chloride 83 (*)    CO2 44 (*)    All other components within normal limits  BRAIN NATRIURETIC PEPTIDE - Abnormal; Notable for the following components:   B Natriuretic Peptide 141.0 (*)    All other components within normal limits  CBC WITH DIFFERENTIAL/PLATELET - Abnormal; Notable for the following components:   Hemoglobin 12.7 (*)    All other components within normal limits  RESP PANEL BY RT-PCR (FLU A&B, COVID) [id number]    EKG EKG Interpretation  Date/Time:  Friday January 03 2021 10:26:49 EST Ventricular Rate:  90 PR Interval:    QRS Duration: 79 QT Interval:  362 QTC Calculation: 443 R Axis:   68 Text Interpretation: Sinus rhythm Confirmed by Vanetta Mulders [phone]) on 01/03/2021 10:33:45 AM   Radiology DG Chest [facility]  1 View  Result Date: 01/03/2021 CLINICAL DATA:  Shortness of breath and weakness EXAM: PORTABLE CHEST 1 VIEW COMPARISON:  11/04/2020 FINDINGS: Cardiac shadow is stable. Persistent interstitial changes noted bilaterally stable from the prior exam. Minimal blunting of the costophrenic angles is again noted. No bony abnormality is noted. IMPRESSION: Persistent interstitial changes similar to that seen on the prior exam. No new  focal infiltrate is seen. Electronically Signed   By: Alcide CleverMark  Lukens M.D.   On: 01/03/2021 11:28    Procedures Procedures   Medications Ordered in ED Medications  methylPREDNISolone sodium succinate (SOLU-MEDROL) 125 mg/2 mL injection 125 mg (125 mg Intravenous Given 01/03/21 1143)  magnesium sulfate IVPB 2 g 50 mL (0 g Intravenous Stopped 01/03/21 1345)  albuterol (VENTOLIN HFA) 108 (90 Base) MCG/ACT inhaler 8 puff (8 puffs Inhalation Given 01/03/21 1157)  albuterol (PROVENTIL) (2.5 MG/3ML) 0.083% nebulizer solution 5 mg (5 mg Nebulization Given 01/03/21 1459)  ipratropium (ATROVENT) nebulizer solution 0.5 mg (0.5 mg Nebulization Given 01/03/21 1459)    ED Course  I have reviewed the triage vital signs and the nursing notes.  Pertinent labs & imaging results that were available during my care of the patient were reviewed by me and considered in my medical decision making (see chart for details).    MDM Rules/Calculators/A&P                          CC:sob VS: BP 129/71 (BP Location: Left Arm)   Pulse 96   Temp 98.7 F (37.1 C) (Oral)   Resp (!) 24   SpO2 100%   [id number]:History is gathered by patient  and EMR. Previous records obtained and reviewed. DDX:The patient's complaint of SOB involves an extensive number of diagnostic and treatment options, and is a complaint that carries with it a high risk of complications, morbidity, and potential mortality. Given the large differential diagnosis, medical decision making is of high complexity. The emergent differential diagnosis for shortness of breath includes, but is not limited to, Pulmonary edema, bronchoconstriction, Pneumonia, Pulmonary embolism, Pneumotherax/ Hemothorax, Dysrythmia, ACS.   Labs: I ordered reviewed and interpreted labs which include CBC which shows mild anemia which is above patient's baseline at this time BMP shows elevated bicarb likely secondary to patient's chronic respiratory failure.  Respiratory panel is negative for  Covid and influenza.  BNP is not significantly elevated. Imaging: I ordered and reviewed images which included portable chest x-ray. I independently visualized and interpreted all imaging. There are no acute, significant findings on today's images. EKG: Sinus rhythm at a rate of 90 Consults: MDM: [age] year old male here with complaint of shortness of breath.  He has chronic hypoxic respiratory failure.  Albuterol prior to arrival with improvement in his symptoms and currently maintaining oxygen saturations above 90% on his standard 4 L however he does have persistent labored breathing.  Patient treated in the emergency department with albuterol, Atrovent, magnesium, and Solu-Medrol with significant improvement in his symptoms.  He feels that he is back to baseline at this time and I think this likely represents a COPD exacerbation.  He does not appear to have coronavirus.  He has no evidence of pneumonia or spontaneous pneumothorax.  Patient be discharged with a Medrol Dosepak. Patient disposition:The patient appears reasonably screened and/or stabilized for discharge and I doubt any other medical condition or other [facility] requiring further screening, evaluation, or treatment in the ED at this time prior to discharge. I have discussed lab and/or  imaging findings with the patient and answered all questions/concerns to the best of my ability.I have discussed return precautions and OP follow up.  Jeffrey [name] was evaluated in Emergency Department on 01/04/2021 for the symptoms described in the history of present illness. He was evaluated in the context of the global COVID-19 pandemic, which necessitated consideration that the patient might be at risk for infection with the SARS-CoV-2 virus that causes COVID-19. Institutional protocols and algorithms that pertain to the evaluation of patients at risk for COVID-19 are in a state of rapid change based on information released by regulatory bodies including the CDC and  federal and state organizations. These policies and algorithms were followed during the patient's care in the ED.  Final Clinical Impression(s) / ED Diagnoses Final diagnoses:  COPD exacerbation (HCC)    Rx / DC Orders ED Discharge Orders         Ordered    methylPREDNISolone (MEDROL DOSEPAK) 4 MG TBPK tablet  Status:  Discontinued        01/03/21 1420    methylPREDNISolone (MEDROL DOSEPAK) 4 MG TBPK tablet        01/03/21 1426           Arthor Captain, PA-C 01/04/21 0850    Vanetta Mulders, MD 01/08/21 [phone]

## 2021-01-03 NOTE — ED Triage Notes (Signed)
Pt reports SHOB today. Pt chronically on 4L. Pt 82% on 4L [place] at this time.

## 2021-01-03 NOTE — Discharge Instructions (Signed)
Get help right away if: °You have worsening shortness of breath, even when resting. °You have trouble talking. °You have severe chest pain. °You cough up blood. °You have a fever. °You have weakness, vomit repeatedly, or faint. °You feel confused. °You are not able to sleep because of your symptoms. °You have trouble doing daily activities. °

## 2021-01-03 NOTE — ED Notes (Signed)
This RN to bedside after noting that patient was removed from monitor. Found patient to be sitting in chair, oxygen removed, IV removed and bleeding noted to clothes and floor.  Pt states "I'm ready to leave."  Pt re-educated on plan of care and current delay.  Re-oriented to remain in assigned room and on discharge process.

## 2021-01-03 NOTE — ED Notes (Signed)
This RN educated by staff that while away from bedside, patient was found sitting outside room, cardiac monitor to be removed, IV site to be removed and bleeding and oxygen not in place. Educate that patient was noted to be 60% on room air and assisted back into bed, new line placed by ED staff and cardiac monitor returned. On arrival to room, this RN found patient to be sitting on bed, monitor in place, bed locked in the lowest position, side rails x2, call bell within reach.  Pt states "I was ready to go and I thought I could leave." Educated on current plan of care, provided with a warm blanket and a urinal and reoriented to call light use and television.  Pt currently in agreement at this time.

## 2021-01-03 NOTE — ED Notes (Signed)
This RN and ED staff rounded on patient. Found patient and patient belongings to be missing from assigned room. Area surrounding assigned room searched by ED staff and patient was found sitting in a chair in main hospital lobby, had soiled self and oxygen laying in floor.  Pt states to staff on arrival "I need a stretcher, I can't walk." Pt then assisted back into a wheel chair, transported back to [facility] room [facility] per request. Assisted back into bed, this RN asked patient what his goal of plan of care is and patient laughed stating "I've been a bad person."  This RN reinforced education on plan of care and concern for safety. RT to bedside for breathing treatment at this time.

## 2021-01-03 NOTE — ED Notes (Signed)
This RN noted that patient was removed from cardiac monitor.  On arrival to room, this RN notes that patient had removed all cardiac monitor and continuous pulse oximetry cords and was sitting on the foot of the bed. Pt states "I was getting ready to leave. They told me they were gonna let me go."  This RN educated that staff will be at bedside when able to discharge, but that patient needs to remain in bed for fall prevention. Pt assisted back into bed, door opened and lights remained on for rounding.  Pt verbalizing understanding at this time.

## 2021-01-07 ENCOUNTER — Other Ambulatory Visit: Payer: Self-pay

## 2021-01-07 ENCOUNTER — Encounter: Payer: Self-pay | Admitting: Internal Medicine

## 2021-01-07 ENCOUNTER — Ambulatory Visit (INDEPENDENT_AMBULATORY_CARE_PROVIDER_SITE_OTHER): Payer: Medicare Other | Admitting: Internal Medicine

## 2021-01-07 ENCOUNTER — Ambulatory Visit (INDEPENDENT_AMBULATORY_CARE_PROVIDER_SITE_OTHER): Payer: Medicare Other

## 2021-01-07 ENCOUNTER — Telehealth: Payer: Self-pay | Admitting: Internal Medicine

## 2021-01-07 DIAGNOSIS — F17210 Nicotine dependence, cigarettes, uncomplicated: Secondary | ICD-10-CM | POA: Diagnosis not present

## 2021-01-07 DIAGNOSIS — J9611 Chronic respiratory failure with hypoxia: Secondary | ICD-10-CM

## 2021-01-07 DIAGNOSIS — I5030 Unspecified diastolic (congestive) heart failure: Secondary | ICD-10-CM | POA: Diagnosis not present

## 2021-01-07 DIAGNOSIS — J449 Chronic obstructive pulmonary disease, unspecified: Secondary | ICD-10-CM

## 2021-01-07 DIAGNOSIS — J9612 Chronic respiratory failure with hypercapnia: Secondary | ICD-10-CM

## 2021-01-07 MED ORDER — YUPELRI 175 MCG/3ML IN SOLN: 175.0000 ug | Freq: Every day | RESPIRATORY_TRACT | 0 refills | Status: DC

## 2021-01-07 MED ORDER — ALBUTEROL SULFATE (2.5 MG/3ML) 0.083% IN NEBU
2.5000 mg | INHALATION_SOLUTION | RESPIRATORY_TRACT | 12 refills | Status: AC | PRN
Start: 2021-01-07 — End: ?

## 2021-01-07 MED ORDER — FORMOTEROL FUMARATE 20 MCG/2ML IN NEBU: 20.0000 ug | INHALATION_SOLUTION | Freq: Two times a day (BID) | RESPIRATORY_TRACT | 0 refills | Status: DC

## 2021-01-07 MED ORDER — FORMOTEROL FUMARATE 20 MCG/2ML IN NEBU: 20.0000 ug | INHALATION_SOLUTION | Freq: Two times a day (BID) | RESPIRATORY_TRACT | Status: DC

## 2021-01-07 MED ORDER — METHYLPREDNISOLONE ACETATE 80 MG/ML IJ SUSP
120.0000 mg | Freq: Once | INTRAMUSCULAR | Status: AC
  Administered 2021-01-07: 120 mg via INTRAMUSCULAR

## 2021-01-07 MED ORDER — YUPELRI 175 MCG/3ML IN SOLN: 175.0000 ug | Freq: Every day | RESPIRATORY_TRACT | Status: DC

## 2021-01-07 MED ORDER — PREDNISONE 10 MG PO TABS
ORAL_TABLET | ORAL | 0 refills | Status: DC
Start: 2021-01-07 — End: 2021-01-16

## 2021-01-07 MED ORDER — BISOPROLOL FUMARATE 5 MG PO TABS: 5.0000 mg | ORAL_TABLET | Freq: Every day | ORAL | 5 refills | Status: AC

## 2021-01-07 MED ORDER — DOXYCYCLINE HYCLATE 100 MG PO TABS: 100.0000 mg | ORAL_TABLET | Freq: Two times a day (BID) | ORAL | 0 refills | Status: DC

## 2021-01-07 NOTE — Telephone Encounter (Signed)
Called and spoke with pt's daughter Jeffrey [name] who stated pt began to have increased SOB, wheezing, swelling in feet which began about 4-5 days ago. Jeffrey [name] stated that she did take pt to the ED for eval 2/11 but pt ended up walking out due to fear of covid and also due to not wanting to be admitted having fears that if he were to be admitted that they would place him back on the vent.  Jeffrey [name] stated that she did a home covid test on pt 2/13 which did come back negative.  Pt is using trelegy inhaler every day, using cpap at night, and is wearing his O2 at 4L every day.  Pt did use his rescue inhaler 2 days ago. Jeffrey [name] stated that she does not think pt has used the nebulizer any. Pt has not had any fever.  Jeffrey [name] stated that pt would like to be seen for an appt and only wants to see Dr. Sherene Sires. Pt is a [place] pt but Jeffrey [name], daughter is willing to bring pt to [facility] office.  Dr. Sherene Sires, please advise on this and any recs for both pt and daughter Jeffrey [name].   Assessment & Plan Note by Nyoka Cowden, MD at 11/04/2020 4:28 PM  Author: Nyoka Cowden, MD Author Type: Physician Filed: 11/04/2020 4:29 PM  Note Status: Written Cosign: Cosign Not Required Encounter Date: 11/04/2020  Problem: Chronic respiratory failure with hypoxia and hypercapnia (HCC)  Editor: Nyoka Cowden, MD (Physician)               HC03 level  01/16/20  = 32  -  03/29/2020  Walked slow pace on 5lpm POC with sats starting at 92 and dropping to 82%  -  07/31/2020   Walked RA  approx   100 ft  @ slow pace  stopped due to  Sob on 4lpm but sats still 93%   -  HC03  10/07/20  =  31    as of  11/04/2020   rx is 4lpm 24 / 7    Advised based on Data from [place] needs booster 6 m p 2nd covid shot.          Each maintenance medication was reviewed in detail including emphasizing most importantly the difference between maintenance and prns and under what circumstances the prns are to be triggered using an action plan format  where appropriate.  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   Editor: Nyoka Cowden, MD (Physician)               Quit smoking 2020 PFT 10/09/16 >> FEV1 1.84 (49%), FEV1% 48, TLC 7.39 (105%), DLCO 36% A1AT 10/09/16 >> 215, MM Spirometry 02/02/17 >> FEV1 2.00 (55%), FEV1% 54 - 03/29/2020  After extensive coaching inhaler device,  effectiveness =    90% with elipta, 75% with hfa > trelegy  >>  improved 04/26/2020      Group D in terms of symptom/risk and laba/lama/ICS  therefore appropriate rx at this point >>>  Continue trelegy and approp saba  I  spent extra time with pt today reviewing appropriate use of albuterol for prn use on exertion with the following points: 1) saba is for relief of sob that does not improve by walking a slower pace or resting but rather if the pt does not improve after trying this first. 2) If the pt is convinced, as many are, that saba helps recover from activity faster then it's easy to tell if this is the case by re-challenging : ie stop, take the inhaler, then p 5 minutes try the exact same activity (intensity of workload) that just caused the symptoms and see if they are substantially diminished or not after saba 3) if there is an activity that reproducibly causes the symptoms, try the saba 15 min before the activity on alternate days   If in fact the saba really does help, then fine to continue to use it prn but advised may need to look closer at the maintenance regimen being used to achieve better control of airways disease with exertion.         Patient Instructions by  Nyoka Cowden, MD at 11/04/2020 2:00 PM Author: Nyoka Cowden, MD Author Type: Physician Filed: 11/04/2020 2:17 PM  Note Status: Addendum Cosign: Cosign Not Required Encounter Date: 11/04/2020  Editor: Nyoka Cowden, MD (Physician)      Prior Versions: 1. Nyoka Cowden, MD (Physician) at 11/04/2020 2:15 PM - Signed    Plan A = Automatic = Always=    Trelegy one click each am   Plan B = Backup (to supplement plan A, not to replace it) Only use your albuterol inhaler as a rescue medication to be used if you can't catch your breath by resting or doing a relaxed purse lip breathing pattern.  - The less you use it, the better it will work when you need it. - Ok to use the inhaler up to 2 puffs  every 4 hours if you must but call for appointment if use goes up over your usual need - Don't leave home without it !!  (think of it like the spare tire for your car)   Plan C = Crisis (instead of Plan B but only if Plan B stops working) - only use your albuterol nebulizer if you first try Plan B and it fails to help > ok to use the nebulizer up to every 4 hours but if start needing it regularly call for immediate appointment  Please remember to go to the  x-ray department  @  [facility] for your tests - we will call you with the results when they are available      Please schedule a follow up visit in 6 months but call sooner if needed

## 2021-01-07 NOTE — Telephone Encounter (Signed)
Called and spoke with pt's daughter Jeffrey [name] letting her know the info stated by MW. [name] verbalized understanding.  Order for O2 supplies has been sent to [employer].  While looking at pt's med list, I do not see where bisoprolol had been sent to pharmacy after pt's OV. I have sent Rx for bisoprolol to pharmacy for pt.  Also, Jeffrey [name] wanted to know if it would be okay for pt to take melatonin as pt has not been sleeping well at night and she also wants to know if pt should be taking one trazodone at bedtime or 2 as she said that pt usually takes 2 but the paperwork states for him to take 1.  Dr. Sherene Sires, please advise.

## 2021-01-07 NOTE — Telephone Encounter (Signed)
Call made to patient daughter, confirmed DOB. Made aware of MR recommendations. Voiced understanding. She wanted to know what to do about him not sleeping. She reports he use to take Ambien in the past but she is not sure why he stopped.   MW please advise.   Thanks

## 2021-01-07 NOTE — Telephone Encounter (Signed)
1) should not use ipatropium as that's what's in yupelri - hold it for now to 1st see if responds to yupelri 2) no change on gabapentin 3) leave lopressor off and substitute bisoprolol 5 mg one daily (new rx) 4) continue pantoprazole 40 mg  Take 30-60 min before first meal of the day   All the other medication issues are trivial and would not worry about them but just bring them in at the next visit (doesn't really need to take any of them in the short run and may just add to confusion with meds)  Needs order for 02 equipment from [employer]

## 2021-01-07 NOTE — Telephone Encounter (Signed)
Working on the breathing first, so be sure taking trazadone consistently and would use the 2 doses if 1 not working as this is supposed to help him sleep

## 2021-01-07 NOTE — Telephone Encounter (Signed)
Called and spoke with Jeffrey [name] and she is going to see if her husband is able to bring the pt in for appt today or tomorrow with MW>

## 2021-01-07 NOTE — Telephone Encounter (Signed)
Daughter called back.  Asked for the 11 today.  Scheduled.  Nothing further needed at this time.

## 2021-01-07 NOTE — Assessment & Plan Note (Addendum)
HC03 level  01/16/20  = 32  -  03/29/2020  Walked slow pace on 5lpm POC with sats starting at 92 and dropping to 82%  -  07/31/2020   Walked RA  approx   100 ft  @ slow pace  stopped due to  Sob on 4lpm but sats still 93%   -  HC03  10/07/20  =  31  -  HC03   01/03/21    = 44   Though somewhat paradoxic, when the lung fails to clear C02 properly and pC02 rises the lung then becomes a more efficient scavenger of C02 allowing lower work of breathing and  better C02 clearance albeit at a higher serum pC02 level - this is why pts can look a lot better than their ABG's would suggest and why it's so difficult to prognosticate endstage dz.  It's also why I strongly rec DNI status (ventilating pts down to a nl pC02 adversely affects this compensatory mechanism)   >>> advised to titrate 02 to sats low 902          Each maintenance medication was reviewed in detail including emphasizing most importantly the difference between maintenance and prns and under what circumstances the prns are to be triggered using an action plan format where appropriate.  Total time for H and P, chart review, counseling, reviewing neb/02 device(s) and generating customized AVS unique to this acute office visit with pt refusing er eval / same day charting = 45 min

## 2021-01-07 NOTE — Addendum Note (Signed)
Addended by: Sandrea Hughs B on: 01/07/2021 05:48 PM   Modules accepted: Level of Service

## 2021-01-07 NOTE — Assessment & Plan Note (Signed)
Echo 11/12/21diastolic dysfunction with AR and severe LAE   Already on CCB and BB > will change BB to bisoprolol and continue Lasix, advise wearing approp 02/ d/c cigs will help as well

## 2021-01-07 NOTE — Assessment & Plan Note (Signed)

## 2021-01-07 NOTE — Assessment & Plan Note (Addendum)
Active smoking  PFT 10/09/16 >> FEV1 1.84 (49%), FEV1% 48, TLC 7.39 (105%), DLCO 36% A1AT 10/09/16 >> 215, MM Spirometry 02/02/17 >> FEV1 2.00 (55%), FEV1% 54 - 03/29/2020  After extensive coaching inhaler device,  effectiveness =    90% with elipta, 75% with hfa > trelegy  >>  improved 04/26/2020    - 01/07/2021 d/c'd trelegy due to hoarseness > converted to performist/yupelri  And prn saba  DDX of  difficult airways management almost all start with A and  include Adherence, Ace Inhibitors, Acid Reflux, Active Sinus Disease, Alpha 1 Antitripsin deficiency, Anxiety masquerading as Airways dz,  ABPA,  Allergy(esp in young), Aspiration (esp in elderly), Adverse effects of meds,  Active smoking or vaping, A bunch of PE's (a small clot burden can't cause this syndrome unless there is already severe underlying pulm or vascular dz with poor reserve) plus two Bs  = Bronchiectasis and Beta blocker use..and one C= CHF   Adherence is always the initial "prime suspect" and is a multilayered concern that requires a "trust but verify" approach in every patient - starting with knowing how to use medications, especially inhalers, correctly, keeping up with refills and understanding the fundamental difference between maintenance and prns vs those medications only taken for a very short course and then stopped and not refilled.  - daughter reports doing much better when he lived with her. - advised on med reconcilation and return with all meds in hand using a trust but verify approach to confirm accurate Medication  Reconciliation The principal here is that until we are certain that the  patients are doing what we've asked, it makes no sense to ask them to do more.   Active smoking greatest concern (see separate a/p)   ?allergy/ asthma > Prednisone 10 mg take  4 each am x 2 days,   2 each am x 2 days,  1 each am x 2 days and stop   ? Acid (or non-acid) GERD > always difficult to exclude as up to 75% of pts in some series  report no assoc GI/ Heartburn symptoms> rec continue max (24h)  acid suppression and diet restrictions/ reviewed   ? Alpha one AT  Def > ruled out already  ? Adverse effects of dpi > d/c and replace LAMA/LABA with neb form  ? Active sinusitis/ rhinitis/ bronchitis > doxy x 7 days based on sputum color and absence of as dz on cxr  ? BB effects > In the setting of respiratory symptoms of unknown etiology,  It would be preferable to use bystolic, the most beta -1  selective Beta blocker available in sample form, with bisoprolol the most selective generic choice  on the market, at least on a trial basis, to make sure the spillover Beta 2 effects of the less specific Beta blockers are not contributing to this patient's symptoms.  - see hbp  ? chf > see diastolic dysfunction

## 2021-01-07 NOTE — Patient Instructions (Addendum)
Depomedrol 120 mg IM   Stop trelegy and propranolol   Plan A = Automatic = Always=    Performist and yupelri  And 12 hours later just the perfomist  Plan B = Backup (to supplement plan A, not to replace it) Only use your albuterol inhaler(RED/proair) as a rescue medication to be used if you can't catch your breath by resting or doing a relaxed purse lip breathing pattern.  - The less you use it, the better it will work when you need it. - Ok to use the inhaler up to 2 puffs  every 4 hours if you must but call for appointment if use goes up over your usual need - Don't leave home without it !!  (think of it like the spare tire for your car)   Plan C = Crisis (instead of Plan B but only if Plan B stops working) - only use your albuterol nebulizer if you first try Plan B and it fails to help > ok to use the nebulizer up to every 4 hours but if start needing it regularly call for immediate appointment   Please remember to go to the  x-ray department  for your tests - we will call you with the results when they are available     If getting worse return to the hospital and no smoking at all.   Late add: doxy 100 mg bid x 7 days   See me in one week in [place] and go to ER if worse at all

## 2021-01-07 NOTE — Telephone Encounter (Signed)
Ok to add on any time except this afternoon.  Needs to bring all meds In meantime can use albuterol 2 puffs q 4h prn   To ER if not getting relief from albuterol as this would indicate more likely heart related and echo from Oct 04 2020  does show high heart pressures

## 2021-01-07 NOTE — Telephone Encounter (Signed)
Would just take the amount of trazadone he's been taking  Would not add melatonin at this point  - really trying to simplify his rx before adding more meds to the mix but might consider later

## 2021-01-07 NOTE — Telephone Encounter (Signed)
Called and spoke with pts daughter and she stated that she has compared the list from todays visit to his meds at home.  These are the things that did not match up:  1.  The pt has ipratroprium at home and not the albuterol for the nebulizer.   She wanted to make sure that this is what he needs to use.  2. Gabapentin in his bubble pack in 400 mg  1 tab TID, but on the list it is listed as 300 mg  2 tabs bid.   3. Lopressor is on his list but he is not taking this--he doesn't have this in his bubble pack at home.  4. He is taking one a day mens multivitamin---on his list it says multivitamin with minerals.  Which should he be taking? 5. Trazodone 50 mg is listed on his med sheet as taking one at bedtime but he takes 2.  6. b12 he is only taking 500 mcg but on his sheet says take .  7. He is taking B1 but this is not listed on his med sheet. Should he be taking this? 8. There are 2 meds in his bubble pack that he is taking but it is not listed on his med sheet.   Pantoprazole and propranolol.  Should he continue to take these?   Pt is also needing an order sent to [facility] so they can get him nasal cannulas.  He is breaking the ones that he has.    Please advise. Thanks

## 2021-01-07 NOTE — Progress Notes (Signed)
Jeffrey [name], male    DOB: 14-Mar-1961 .   MRN: [id number]   Brief patient profile:  59 yowm MM/quit smoking 09/2019 at 5lpm 24/7 and not much better since self referred to [facility] clinic 03/29/2020 p prior rx by [facility]/ [facility]   PFT 10/09/16 >> FEV1 1.84 (49%), FEV1% 48, TLC 7.39 (105%), DLCO 36%  Spirometry 02/02/17 >> FEV1 2.00 (55%), FEV1% 54     Admission date:  12/24/2019   Discharge Date:  01/10/2020   Primary MD  Gareth Morgan, MD   Discharge Diagnosis  Encephalopathy [G93.40] History of ETT [Z92.89] Acute on chronic respiratory failure with hypercapnia (HCC) [J96.22] Acute respiratory failure with hypoxia and hypercapnia (HCC) [J96.01, J96.02]   Active Problems:   Bipolar disorder with severe depression (HCC)   Essential hypertension   COPD with acute exacerbation (HCC)   Acute on chronic respiratory failure with hypercapnia (HCC)   Acute on chronic respiratory failure with hypoxia and hypercapnia (HCC)   Lobar pneumonia (HCC)   Acute metabolic encephalopathy   Acute on chronic diastolic CHF (congestive heart failure) (HCC)   Aspiration pneumonitis (HCC)        HPI  from the history and physical done on the day of admission:    Chief Complaint:AMS,difficulty breathing  HPI:  Per chart review patient was brought into the ED reports of difficulty breathing x 3 days, with confusion, and in the ED patient was tremulous.  Patient's ex-wife who now lives with patient tells me patient has not had any drink or use any drugs inthe past year. She reports increasing difficulty breathing and productive cough. No vomiting no loose stools. She said the past 3 days when patient was confused tells when he said he wanted someone to shoot him. She otherwise denies suicidal ideations. She also reports patient has gotten tired of using oxygen, his bronchodilators, and lying around as normally he likes to be active.  Admission11/19- 11/17for acute hypercapnic  respiratory failure secondary to COPD exacerbation and component of CHF.RequiredPrecedex drip for agitation.Discharged home on 3 to 4 L nasal cannula which was his baseline.  ED Course:Tachypneic, O2 sats 91 to 95% on nasal cannula 4 L. But with worsening mental status, patient not responding, and thenagitated, pullingat lines and tubes, decision was made to intubate patient as patient would not tolerate BiPAP. And sedated with propofol. ABG showed pH of 7.3, marked elevated PCO2 of 107. PaO2 <31. Covid test negative. Portable chest x-ray shows left greater than right base airspace disease, aspiration orinfection favored. IV vancomycin and cefepime started for HCAP.     Hospital Course:    Brief Narrative: 60 year old male with history of COPD is chronically on 3 to 4 L of oxygen, admitted with worsening shortness of breath. Felt to be related to COPD exacerbation. He was intubated in the emergency room and admitted to the ICU. Fortunately, he was able to extubate on 12/26/19. Hospital course further complicated by development of acute metabolic encephalopathy. Etiology is unclear, but may be related to high-dose steroids. Required Precedex infusion. Steroids have been tapered off ---Mental status overall much improved,  -Patient is cooperative, talkative and mostly appropriate -Was able to wean off Precedex drip on 01/04/2020-  -Patient is medically stable for discharge - Transfer to SNF rehab   Assessment & Plan: 1)Acute on chronic respiratory failure with hypoxia-ABG suggested mild hypercapnia as well----- secondary to COPD exacerbation and aspiration pneumonia. Patient was intubated on admission (12/24/19), but was able to extubate 12/26/19. Overall respiratory status  appears to be stable. Currently, he is requiring 2 L, which is back to his baseline PTA  2) acute metabolic encephalopathy with agitation----patient with underlying history of bipolar disorder, was  on high-dose steroids for COPD exacerbation respiratory failure, - -[facility] and PCCM consult appreciated for management of presumed steroid-induced psychosis, treated with Precedex drip and as needed Geodon -----Mental status overall much improved. -No longer requiring IV medications for sedation -Patient is cooperative, talkative and mostly appropriate -Was able to wean off Precedex drip on 01/04/2020-  -Patient with history of EtOH abuse, denies recent EtOH use -As per daughter Ms. 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 twice daily -May use as needed ODT Zyprexa --Occasional episodes of restlessness, patient is usually redirectable -- may need further adjustments of antipsychotic/psychiatric medications to optimize mood and behavior  3)Acute COPD exacerbation secondary to presumed aspiration pneumonia --- extubated 12/26/2019 , c =-ABG was suggestive of hypoxia and hypercapnia, completed antibiotics already, prednisone discontinued 01/04/2020  4)Bipolar Disorder- Continue home psychotropic medications including olanzapine and Depakote, also continue Prozac, lorazepam and trazodone,--- additional psych medication adjustments as above ----Mental status overall much improved,  -Not requiring IV medications for sedation -Patient is cooperative, talkative and mostly appropriate ----Occasional episodes of restlessness, patient is usually redirectable -- may need further adjustments of antipsychotic/psychiatric medications to optimize mood and behavior  5)Hypertension---.c/nmetoprolol to 12.5mg  twice daily,   6)Dysphonia/Dysphagia--- voice is a bit louder, patient voice is  soft/muffled apparently this is not new (initially worsened post intubation) -Speech pathology eval  appreciated recommends-Dysphagia 3 (mechanical soft); liquids have been changed to thin liquids as of 01/05/2020 from nectar-thick liquid  7)[name]--stable, continue atorvastatin and aspirin  8)Social/Ethics-- Discussed with Daughter [name] -[phone] [facility]), Pt is a Full code  9)Tobacco Abuse----smoking cessation        History of Present Illness  03/29/2020  Pulmonary/ 1st office eval/Casady Voshell  Chief Complaint  Patient presents with  . Follow-up    sob with exertion.  sob with sitting still talking.  dry cough from time to time.  Dyspnea: Tol  Very little activity, MB maybe 368ft hasn't done x sev weeks prior to OV  Stop half way to catch breath  Cough: not much now/ minimal white mucus   Sleep: bed is flat / two pillows  SABA use: twice daily / neb 2x daily  02  5lpm at home with high 90's and then walk on 3lpm POC  rec Plan A = Automatic = Always=    Trelegy one click each am - take two good deep drags and out thru nose  Plan B = Backup (to supplement plan A, not to replace it) Only use your albuterol inhaler as a rescue medication to be used if you can't catch your breath by resting or doing a relaxed purse lip breathing pattern.  - The less you use it, the better it will work when you need it. - Ok to use the inhaler up to 2 puffs  every 4 hours if you must but call for appointment if use goes up over your usual need - Don't leave home without it !!  (think of it like the spare tire for your car)  Plan C = Crisis (instead of Plan B but only if Plan B stops working) - only use your albuterol nebulizer  if you first try Plan B and it fails to help > ok to use the nebulizer up to every 4 hours but if start needing it regularly call for immediate appointment Make sure you check your oxygen saturations at highest level of activity to be sure it stays over 90% (low 90s is perfect) and adjust upward to maintain this level if needed but remember to turn it back to previous settings  when you stop (to conserve your supply).  Please schedule a follow up office visit in 4 weeks, sooner if needed  with all respiratory  medications /inhalers/ solutions in hand so we can verify exactly what you are taking. This includes all medications from all doctors and over the counters Add cxr needed   Also added best fit for amb 02 order     04/26/2020  f/u ov/Keng Jewel re: GOLD II/III 02 on trelegy daily  S/p moderna Chief Complaint  Patient presents with  . Follow-up    Breathing has improved some since the last visit. His voice is back to normal.   Dyspnea:  Shower is easier but not the mb and desats on 3lpm POC but not adjusting  Cough: very seldom  Sleeping: bed is flat / 2 pillows SABA use: once a day 02: 3lpm conc/ POC  3lpm walking but it will go to 5 and never checks sats  rec Plan A = Automatic = Always=    trelegy is first thing in am  One click and two good drags Plan B = Backup (to supplement plan A, not to replace it) Only use your albuterol (Ventolin)inhaler as a rescue medication  Plan C = Crisis (instead of Plan B but only if Plan B stops working) - only use your albuterol nebulizer if you first try Plan B and it fails to help    07/31/2020  f/u ov/[place] office/Kennieth Plotts re: GOLD II/III 02 on trelegy daily Chief Complaint  Patient presents with  . Follow-up   Dyspnea: MMRC3 = can't walk 100 yards even at a slow pace at a flat grade s stopping due to sob Cough: some rattling thick mucus/  Sleeping: bed is flat / 3 pillows  SABA use: still sev times per day hfa but not neb  02: 4lpm 24/7  rec Prednisone 10 mg take  4 each am x 2 days,   2 each am x 2 days,  1 each am x 2 days and stop  I emphasized that nasal steroids (flonase/nasacort)  have no immediate benefit  Recommend the booster of moderna when it is offered probably this fall  The key is to stop smoking completely before smoking completely stops you! For cough congestion > mucinex dm 1200 mg every 12 hours is  the maximum dose  Make sure you check your oxygen saturations at highest level of activity  Please schedule a follow up visit in 3 months but call sooner if needed   See admit  Admit date: 09/29/2020 Discharge date: 10/09/2020 Discharge Diagnosis:   Active Problems:   Dyslipidemia   COPD exacerbation (HCC)   Essential hypertension   GERD (gastroesophageal reflux disease)   Acute on chronic respiratory failure with hypoxia and hypercapnia (HCC)   Acute metabolic encephalopathy   Lactic acidosis   Hyponatremia   Hyperglycemia   Elevated brain natriuretic peptide (BNP) level   (HFpEF) heart failure with preserved ejection fraction (HCC)   DNR (do not resuscitate) discussion   Pressure injury of skin   Discharge Condition: Improved.  Diet recommendation: Low sodium, heart healthy.    Wound care: None.  Code status: Full.   History of Present Illness:   60 year  old male with past medical history of COPD on 3 to 4 L of oxygen at home, depression, bipolar disorder, chronic diastolic heart failure, hepatic encephalopathy and polysubstance abuse was brought into the hospital with respiratory distress.  Patient was then admitted hospital for acute hypoxic and hypercarbic respiratory failure requiring intubation.  Patient was initially at [facility] and was extubated after which he was put on BiPAP.  Hospital course was complicated by metabolic encephalopathy requiring Precedex drip including Ativan and fentanyl.  Patient was subsequently transferred from any pain to [facility] and [facility] ICU since her family wished aggressive care.  Hospital Course:   Following conditions were addressed during hospitalization as listed below,  Acute on chronic hypoxic and hypercarbic respiratory failure secondary to COPD exacerbation Improved clinically.  Initially was on mechanical ventilation followed by BiPAP.  At this time patient has been assessed for trilogy on discharge.  Will  be continued on oxygen supplementation discharge as well.  Patient has completed antibiotic course.    Pulmonary edema/Acute on chronic diastolic CHF Received IV Lasix with improvement.  Currently evolving.  Acute metabolic encephalopathy This was likely multifactorial, hypoxia, ICU delirium, steroids, withdrawal from benzos.  Improved during hospitalization.  Initially required Precedex drip.  Subsequently was restarted on valproic acid and Prozac from home.  Hypokalemia Replenished.  Anemia of critical illness and chronic disease Remained stable.  Bipolar disorder/Depression -Restarted on valproic acid and Prozac.   11/04/2020  f/u ov/[place] office/Ting Cage re:  COPD with GOLD III    spirometry / 02 dep @ 24/7 - says off cigs x one year Chief Complaint  Patient presents with  . Follow-up    Shortness of breath activity  Dyspnea: 50 - 100 ft  Cough: none  Sleeping: flat / 3 pillows  SABA use: none  02:  4llpm 24/7 / cpap per PCP  rec Plan A = Automatic = Always=    Trelegy one click each am  Plan B = Backup (to supplement plan A, not to replace it) Only use your albuterol inhaler as a rescue medication  Plan C = Crisis (instead of Plan B but only if Plan B stops working) - only use your albuterol nebulizer if you first try Plan B   01/07/2021  f/u ov/Sherrell Weir re: GOLD III copd downhill x one year/ smoking again  Chief Complaint  Patient presents with  . Acute Visit    Increased SOB, wheezing and cough x 2 wks- worse over the past wk. He is winded with just walking.   Dyspnea:  Ok at rest but sob with any other activity = MMRC4  = sob if tries to leave home or while getting dressed   Cough: more congested > producing grey x 2 weeks  Sleeping:couch/  wedge pillow  And cpap "wears it some"  SABA use: albuterol 2 puffs 1.5 h prior to ov 02: 4lpm 24/7 Covid status:  vax x 3    No obvious day to day or daytime variability or assoc   mucus plugs or hemoptysis or cp or chest  tightness,  or overt sinus or hb symptoms.   Sleeping ok  without nocturnal  or early am exacerbation  of respiratory  c/o's or need for noct saba. Also denies any obvious fluctuation of symptoms with weather or environmental changes or other aggravating or alleviating  factors except as outlined above   No unusual exposure hx or h/o childhood pna/ asthma or knowledge of premature birth.  Current Allergies, Complete Past Medical History, Past Surgical History, Family History, and Social History were reviewed in [employer] record.  ROS  The following are not active complaints unless bolded Hoarseness, sore throat, dysphagia, dental problems, itching, sneezing,  nasal congestion or discharge of excess mucus or purulent secretions, ear ache,   fever, chills, sweats, unintended wt loss or wt gain, classically pleuritic or exertional cp,  orthopnea pnd or arm/hand swelling  or leg swelling, presyncope, palpitations, abdominal pain, anorexia, nausea, vomiting, diarrhea  or change in bowel habits or change in bladder habits, change in stools or change in urine, dysuria, hematuria,  rash, arthralgias, visual complaints, headache, numbness, weakness or ataxia or problems with walking or coordination,  change in mood or  memory.        Current Meds  - - NOTE:   Unable to verify as accurately reflecting what pt takes     Medication Sig  . acetaminophen (TYLENOL) 325 MG tablet Take 2 tablets (650 mg total) by mouth every 6 (six) hours as needed for mild pain, fever or headache (or Fever >/= 101).  Marland Kitchen albuterol (PROVENTIL) (2.5 MG/3ML) 0.083% nebulizer solution Take 3 mLs (2.5 mg total) by nebulization every 4 (four) hours as needed for wheezing or shortness of breath.  Marland Kitchen albuterol (VENTOLIN HFA) 108 (90 Base) MCG/ACT inhaler Inhale 2 puffs into the lungs every 4 (four) hours as needed for wheezing or shortness of breath.  Marland Kitchen aspirin EC 81 MG tablet Take 1 tablet (81 mg total) by mouth  daily with breakfast.  . atorvastatin (LIPITOR) 20 MG tablet Take 1 tablet (20 mg total) by mouth daily. For high cholesterol (Patient taking differently: Take 20 mg by mouth every evening. For high cholesterol)  . diltiazem (CARDIZEM CD) 180 MG 24 hr capsule Take 180 mg by mouth daily.  . divalproex (DEPAKOTE ER) 500 MG 24 hr tablet Take 3 tablets (1,500 mg total) by mouth at bedtime.  Marland Kitchen FLUoxetine (PROZAC) 20 MG capsule Take 1 capsule (20 mg total) by mouth daily.  . fluticasone (FLONASE) 50 MCG/ACT nasal spray Place 1 spray into both nostrils daily.  . Fluticasone-Umeclidin-Vilant (TRELEGY ELLIPTA) 100-62.5-25 MCG/INH AEPB Inhale 1 puff into the lungs daily. (Patient taking differently: Inhale 2 puffs into the lungs daily.)  . Fluticasone-Umeclidin-Vilant (TRELEGY ELLIPTA) 100-62.5-25 MCG/INH AEPB Inhale 1 puff into the lungs daily.  . folic acid (FOLVITE) 1 MG tablet Take 1 mg by mouth every morning.   . gabapentin (NEURONTIN) 300 MG capsule Take 2 capsules (600 mg total) by mouth 2 (two) times daily.  Marland Kitchen guaiFENesin (MUCINEX) 600 MG 12 hr tablet Take 1 tablet (600 mg total) by mouth 2 (two) times daily as needed for cough or to loosen phlegm.  . methylPREDNISolone (MEDROL DOSEPAK) 4 MG TBPK tablet Use as directed  . metoprolol tartrate (LOPRESSOR) 25 MG tablet Take 0.5 tablets (12.5 mg total) by mouth 2 (two) times daily.  . Multiple Vitamin (MULTIVITAMIN WITH MINERALS) TABS tablet Take 1 tablet by mouth daily. (Patient taking differently: Take 1 tablet by mouth every morning.)  . Multiple Vitamin (MULTIVITAMIN) tablet Take 1 tablet by mouth daily.  Marland Kitchen OLANZapine (ZYPREXA) 10 MG tablet Take 1 tablet (10 mg total) by mouth 2 (two) times daily.  Marland Kitchen oxymetazoline (AFRIN) 0.05 % nasal spray Place 1 spray into both nostrils daily.  . potassium chloride  SA (KLOR-CON) 20 MEQ tablet Take 20 mEq by mouth daily.  . propranolol (INDERAL) 10 MG tablet Take 10 mg by mouth 2 (two) times daily.  . traZODone  (DESYREL) 50 MG tablet Take 1 tablet (50 mg total) by mouth at bedtime.  . vitamin B-12 (CYANOCOBALAMIN) 500 MCG tablet Take 1,000 mcg by mouth every morning.                     Past Medical History:  Diagnosis Date  . Acute blood loss anemia 02/07/2017  . Anxiety   . Arthritis    deg disease, bulging disk,  shoulder level  . Bipolar disorder (HCC)   . Bipolar disorder (HCC)   . CHF (congestive heart failure) (HCC)   . 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 Depression    anxiety  . Hyperlipidemia   . Hypertension   . Peptic ulcer disease    Review  . Pneumonia   . [name] (peripheral vascular disease) (HCC) 06/18/2015  . Shortness of breath            Objective:    01/07/2021       175  11/04/2020    178   04/26/2020        181   03/29/20 170 lb (77.1 kg)  01/16/20 143 lb 4.8 oz (65 kg)  01/10/20 143 lb 11.8 oz (65.2 kg)      Vital signs reviewed  01/07/2021  - Note at rest 02 sats  96% on 4lpm pulsed   General appearance:    W/c bound extremely hoarse to point of difficulty speaking  HEENT : pt wearing mask not removed for exam due to covid -19 concerns.    NECK :  without JVD/Nodes/TM/ nl carotid upstrokes bilaterally   LUNGS: no acc muscle use,  Mod barrel  contour chest wall with bilateral  Distant bs s audible wheeze and  without cough on insp or exp maneuvers and mod  Hyperresonant  to  percussion bilaterally     CV:  RRR  no s3 or murmur or increase in P2, and no edema   ABD:  soft and nontender with pos mid insp Hoover's  in the supine position. No bruits or organomegaly appreciated, bowel sounds nl  MS:     ext warm without deformities, calf tenderness, cyanosis or clubbing No obvious joint restrictions   SKIN: warm and dry without lesions    NEURO:  alert, approp, nl sensorium with  no motor or cerebellar deficits apparent.       CXR PA and Lateral:   01/07/2021 :    I personally reviewed images and agree with radiology impression as follows:   Mild  increase markings ? Mild chf vs bronchitic changes difffusely                   Assessment

## 2021-01-08 NOTE — Telephone Encounter (Signed)
Attempted to contact patient daughter. Voicemail full.   Will close per triage protocol and send letter.

## 2021-01-08 NOTE — Telephone Encounter (Signed)
01/08/2021  Attempted to reach patient.  Unable to reach.  Unable to leave voicemail.  We will leave in triage for 1 more additional time per protocol on 01/09/2021.  Elisha Headland, FNP

## 2021-01-09 ENCOUNTER — Encounter: Payer: Self-pay | Admitting: *Deleted

## 2021-01-16 ENCOUNTER — Ambulatory Visit (INDEPENDENT_AMBULATORY_CARE_PROVIDER_SITE_OTHER): Payer: Medicare Other | Admitting: Cardiology

## 2021-01-16 ENCOUNTER — Encounter: Payer: Self-pay | Admitting: Cardiology

## 2021-01-16 VITALS — BP 124/60 | HR 84 | Ht 70.0 in | Wt 189.2 lb

## 2021-01-16 DIAGNOSIS — I050 Rheumatic mitral stenosis: Secondary | ICD-10-CM | POA: Diagnosis not present

## 2021-01-16 DIAGNOSIS — I5032 Chronic diastolic (congestive) heart failure: Secondary | ICD-10-CM

## 2021-01-16 MED ORDER — FUROSEMIDE 40 MG PO TABS: ORAL_TABLET | ORAL | 1 refills | Status: AC

## 2021-01-16 NOTE — Patient Instructions (Signed)
Your physician recommends that you schedule a follow-up appointment in: 4 MONTHS WITH DR [facility]  Your physician has recommended you make the following change in your medication:   TAKE LASIX 60 MG FOR 3 DAYS - THEN TAKE 40 MG DAILY - MAY TAKE ADDITIONAL 20 MG DAILY AS NEEDED   Thank you for choosing [facility]!!

## 2021-01-16 NOTE — Progress Notes (Signed)
Clinical Summary Jeffrey [name] is a 60 y.o.male former patient of Dr Purvis Sheffield, this is our first visit together.  1. Chronic diastolic HF - can have some swelling at times.   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 Normally 4-5 L    3. Mitral stenosis - 09/2020 echo: LVEF 55-60%severe LAE, mod to severe mitral stenosis mean grad 10  4. EtOH abuse Past Medical History:  Diagnosis Date  . Acute blood loss anemia 02/07/2017  . Anxiety   . Arthritis    deg disease, bulging disk,  shoulder level  . Bipolar disorder (HCC)   . Bipolar disorder (HCC)   . CHF (congestive heart failure) (HCC)   . 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 Depression    anxiety  . Hyperlipidemia   . Hypertension   . Peptic ulcer disease    Review  . Pneumonia   . [name] (peripheral vascular disease) (HCC) 06/18/2015  . Shortness of breath      Allergies  Allergen Reactions  . Augmentin [Amoxicillin-Pot Clavulanate] Rash and Other (See Comments)    Tolerated Zosyn 12/27/2019 Has patient had a PCN reaction causing immediate rash, facial/tongue/throat swelling, SOB or lightheadedness with hypotension: No Has patient had a PCN reaction causing severe rash involving mucus membranes or skin necrosis: No Has patient had a PCN reaction that required hospitalization No Has patient had a PCN reaction occurring within the last 10 years: Yes If all of the above answers are "NO", then may proceed with Cephalosporin use.  . Ace Inhibitors Hives     Current Outpatient Medications  Medication Sig Dispense Refill  . acetaminophen (TYLENOL) 325 MG tablet Take 2 tablets (650 mg total) by mouth every 6 (six) hours as needed for mild pain, fever or headache (or Fever >/= 101). 12 tablet 0  . albuterol (PROVENTIL) (2.5 MG/3ML) 0.083% nebulizer solution Take 3 mLs (2.5 mg total) by nebulization every 4 (four) hours as needed for  wheezing or shortness of breath. 75 mL 12  . albuterol (VENTOLIN HFA) 108 (90 Base) MCG/ACT inhaler Inhale 2 puffs into the lungs every 4 (four) hours as needed for wheezing or shortness of breath. 6.7 g 3  . aspirin EC 81 MG tablet Take 1 tablet (81 mg total) by mouth daily with breakfast. 30 tablet 4  . atorvastatin (LIPITOR) 20 MG tablet Take 1 tablet (20 mg total) by mouth daily. For high cholesterol (Patient taking differently: Take 20 mg by mouth every evening. For high cholesterol) 30 tablet 0  . bisoprolol (ZEBETA) 5 MG tablet Take 1 tablet (5 mg total) by mouth daily. 30 tablet 5  . diltiazem (CARDIZEM CD) 180 MG 24 hr capsule Take 180 mg by mouth daily.    . divalproex (DEPAKOTE ER) 500 MG 24 hr tablet Take 3 tablets (1,500 mg total) by mouth at bedtime. 90 tablet 2  . doxycycline (VIBRA-TABS) 100 MG tablet Take 1 tablet (100 mg total) by mouth 2 (two) times daily. 14 tablet 0  . FLUoxetine (PROZAC) 20 MG capsule Take 1 capsule (20 mg total) by mouth daily. 30 capsule 2  . fluticasone (FLONASE) 50 MCG/ACT nasal spray Place 1 spray into both nostrils daily.    . folic acid (FOLVITE) 1 MG tablet Take 1 mg by mouth every morning.     . formoterol (PERFOROMIST) 20 MCG/2ML nebulizer solution Take 2 mLs (20 mcg  total) by nebulization 2 (two) times daily. 10 mL 0  . furosemide (LASIX) 40 MG tablet Take 40 mg by mouth daily.    Marland Kitchen gabapentin (NEURONTIN) 300 MG capsule Take 2 capsules (600 mg total) by mouth 2 (two) times daily. 120 capsule 2  . guaiFENesin (MUCINEX) 600 MG 12 hr tablet Take 1 tablet (600 mg total) by mouth 2 (two) times daily as needed for cough or to loosen phlegm. 20 tablet 0  . Multiple Vitamin (MULTIVITAMIN WITH MINERALS) TABS tablet Take 1 tablet by mouth daily. (Patient taking differently: Take 1 tablet by mouth every morning.) 30 tablet 0  . Multiple Vitamin (MULTIVITAMIN) tablet Take 1 tablet by mouth daily.    Marland Kitchen OLANZapine (ZYPREXA) 10 MG tablet Take 1 tablet (10 mg total)  by mouth 2 (two) times daily. 60 tablet 2  . oxymetazoline (AFRIN) 0.05 % nasal spray Place 1 spray into both nostrils daily.    . potassium chloride SA (KLOR-CON) 20 MEQ tablet Take 20 mEq by mouth daily.    . predniSONE (DELTASONE) 10 MG tablet Take  4 each am x 2 days,   2 each am x 2 days,  1 each am x 2 days and stop 14 tablet 0  . revefenacin (YUPELRI) 175 MCG/3ML nebulizer solution Take 3 mLs (175 mcg total) by nebulization daily. 15 mL 0  . traZODone (DESYREL) 50 MG tablet Take 1 tablet (50 mg total) by mouth at bedtime. 30 tablet 2  . vitamin B-12 (CYANOCOBALAMIN) 500 MCG tablet Take 1,000 mcg by mouth every morning.      No current facility-administered medications for this visit.     Past Surgical History:  Procedure Laterality Date  . BIOPSY  02/09/2017   Procedure: BIOPSY;  Surgeon: [name], MD;  Location: [facility];  Service: Endoscopy;;  duodenal gastric  . COLONOSCOPY  03/14/2007   ZDG:LOVFIE colonoscopy and terminal ileoscopy except external hemorrhoids  . COLONOSCOPY N/A 05/05/2013   [name]: external/internal anal canal hemorrhoids, unable to intubate TI, segemental biopsies unremarkable   . COLONOSCOPY N/A 04/11/2017   Procedure: COLONOSCOPY;  Surgeon: [name], MD;  Location: [facility];  Service: Endoscopy;  Laterality: N/A;  . COLONOSCOPY WITH PROPOFOL N/A 03/31/2017   Procedure: COLONOSCOPY WITH PROPOFOL;  Surgeon: [name], MD;  Location: [facility];  Service: Endoscopy;  Laterality: N/A;  1:45pm  . ESOPHAGOGASTRODUODENOSCOPY  03/14/2007   PPI:RJJOACZYSA antral gastritis with bulbar duodenitis/paucity to postbulbar duodenal folds and biopsy were benign with no evidence of villous atrophy.  . ESOPHAGOGASTRODUODENOSCOPY (EGD) WITH PROPOFOL N/A 02/09/2017   Procedure: ESOPHAGOGASTRODUODENOSCOPY (EGD) WITH PROPOFOL;  Surgeon: [name], MD;  Location: [facility];  Service: Endoscopy;  Laterality: N/A;  . GIVENS CAPSULE STUDY N/A  04/08/2017   Procedure: GIVENS CAPSULE STUDY;  Surgeon: [name], MD;  Location: [facility];  Service: Endoscopy;  Laterality: N/A;  . HAND SURGERY     left, secondary to self-inflicted laceration  . HEMORRHOID SURGERY N/A 04/14/2017   Procedure: HEMORRHOIDECTOMY;  Surgeon: [name], MD;  Location: [facility];  Service: General;  Laterality: N/A;  . SHOULDER SURGERY     right  . TOE SURGERY     left great toe , amputated-lawnmover accident     Allergies  Allergen Reactions  . Augmentin [Amoxicillin-Pot Clavulanate] Rash and Other (See Comments)    Tolerated Zosyn 12/27/2019 Has patient had a PCN reaction causing immediate rash, facial/tongue/throat swelling, SOB or lightheadedness  with hypotension: No Has patient had a PCN reaction causing severe rash involving mucus membranes or skin necrosis: No Has patient had a PCN reaction that required hospitalization No Has patient had a PCN reaction occurring within the last 10 years: Yes If all of the above answers are "NO", then may proceed with Cephalosporin use.  Donivan Scull. Ace Inhibitors Hives      Family History  Problem Relation Age of Onset  . Breast cancer Mother        deceased  . Heart disease Father   . Depression Daughter   . Anxiety disorder Daughter   . Anxiety disorder Son   . Depression Son   . Asthma Brother   . Heart attack Maternal Aunt   . Heart attack Maternal Uncle   . Heart attack Paternal Aunt   . Heart attack Paternal Uncle   . Heart attack Maternal Grandmother   . Heart attack Maternal Grandfather   . Emphysema Maternal Grandfather   . Heart attack Paternal Grandmother   . Heart attack Paternal Grandfather   . Colon cancer Neg Hx   . Liver disease Neg Hx      Social History Mr. Janice NorrieFrench reports that he has been smoking cigarettes. He started smoking about 49 years ago. He has a 40.00 pack-year smoking history. He quit smokeless tobacco use about 15 months ago. Mr. Janice NorrieFrench reports no history of alcohol  use.   Review of Systems CONSTITUTIONAL: No weight loss, fever, chills, weakness or fatigue.  HEENT: Eyes: No visual loss, blurred vision, double vision or yellow sclerae.No hearing loss, sneezing, congestion, runny nose or sore throat.  SKIN: No rash or itching.  CARDIOVASCULAR: per hpi RESPIRATORY: No shortness of breath, cough or sputum.  GASTROINTESTINAL: No anorexia, nausea, vomiting or diarrhea. No abdominal pain or blood.  GENITOURINARY: No burning on urination, no polyuria NEUROLOGICAL: No headache, dizziness, syncope, paralysis, ataxia, numbness or tingling in the extremities. No change in bowel or bladder control.  MUSCULOSKELETAL: No muscle, back pain, joint pain or stiffness.  LYMPHATICS: No enlarged nodes. No history of splenectomy.  PSYCHIATRIC: No history of depression or anxiety.  ENDOCRINOLOGIC: No reports of sweating, cold or heat intolerance. No polyuria or polydipsia.  Marland Kitchen.   Physical Examination Today's Vitals   01/16/21 0910  BP: 124/60  Pulse: 84  SpO2: 99%  Weight: 189 lb 3.2 oz (85.8 kg)  Height: 5\' 10"  (1.778 m)   Body mass index is 27.15 kg/m.  Gen: resting comfortably, no acute distress HEENT: no scleral icterus, pupils equal round and reactive, no palptable cervical adenopathy,  CV: RRR, no m/rg, no jvd Resp: mild crackles bilaterally GI: abdomen is soft, non-tender, non-distended, normal bowel sounds, no hepatosplenomegaly MSK: extremities are warm, no edema.  Skin: warm, no rash Neuro:  no focal deficits Psych: appropriate affect   Diagnostic Studies Nuclear stress test 08/10/16 showed small prior myocardial infarction in the basal septum with mild to moderate sized apical infarct with mild peri-infarct ischemia. It was deemed a low risk study. Left ventricle systolic function was normal.  Echocardiogram 10/12/2019:  1. Left ventricular ejection fraction, by visual estimation, is 65 to 70%. The left ventricle has hyperdynamic function. There  is no left ventricular hypertrophy. 2. Elevated left ventricular end-diastolic pressure. 3. Left ventricular diastolic parameters are consistent with Grade I diastolic dysfunction (impaired relaxation). 4. Global right ventricle has normal systolic function.The right ventricular size is normal. No increase in right ventricular wall thickness. 5. Left atrial size was mild-moderately dilated.  6. Right atrial size was normal. 7. The mitral valve is grossly normal. Mild mitral valve regurgitation. 8. The tricuspid valve is grossly normal. Tricuspid valve regurgitation is trivial. 9. Aortic valve regurgitation is mild to moderate. 10. The aortic valve is tricuspid. Aortic valve regurgitation is mild to moderate. No evidence of aortic valve sclerosis or stenosis. 11. The pulmonic valve was not well visualized. Pulmonic valve regurgitation is not visualized. 12. Normal pulmonary artery systolic pressure. 13. The inferior vena cava is normal in size with greater than 50% respiratory variability, suggesting right atrial pressure of 3 mmHg.  Assessment and Plan  1. Mitral stenosis - mod to severe by most recent echo - would not pursue TEE at this time given severe COPD baselien O2 requirement 4-5 L, high risk of intubation if he were to have procedure. Would just monitor with serial TTEs for now, repeat TTE at next follow  2. Chronic diastolic HF - some recent issues with fluid, increase lasix to 60mg  daily x 3 days, then 40mg  daily with additional 20mg  prn.       , M.D.

## 2021-01-17 ENCOUNTER — Other Ambulatory Visit: Payer: Self-pay

## 2021-01-17 ENCOUNTER — Ambulatory Visit (INDEPENDENT_AMBULATORY_CARE_PROVIDER_SITE_OTHER): Payer: Medicare Other | Admitting: Internal Medicine

## 2021-01-17 ENCOUNTER — Encounter: Payer: Self-pay | Admitting: Internal Medicine

## 2021-01-17 DIAGNOSIS — J449 Chronic obstructive pulmonary disease, unspecified: Secondary | ICD-10-CM | POA: Diagnosis not present

## 2021-01-17 DIAGNOSIS — J9611 Chronic respiratory failure with hypoxia: Secondary | ICD-10-CM

## 2021-01-17 DIAGNOSIS — J9612 Chronic respiratory failure with hypercapnia: Secondary | ICD-10-CM

## 2021-01-17 DIAGNOSIS — I5030 Unspecified diastolic (congestive) heart failure: Secondary | ICD-10-CM | POA: Diagnosis not present

## 2021-01-17 MED ORDER — BREZTRI AEROSPHERE 160-9-4.8 MCG/ACT IN AERO: 2.0000 | INHALATION_SPRAY | Freq: Two times a day (BID) | RESPIRATORY_TRACT | 0 refills | Status: DC

## 2021-01-17 MED ORDER — BREZTRI AEROSPHERE 160-9-4.8 MCG/ACT IN AERO: INHALATION_SPRAY | RESPIRATORY_TRACT | 11 refills | Status: DC

## 2021-01-17 NOTE — Patient Instructions (Signed)
Plan A = Automatic = Always=    Breztri Take 2 puffs first thing in am and then another 2 puffs about 12 hours later.  (in place yupelri /performist)  Work on inhaler technique:  relax and gently blow all the way out then take a nice smooth deep breath back in, triggering the inhaler at same time you start breathing in.  Hold for up to 5 seconds if you can. Blow out thru nose. Rinse and gargle with water when done     Plan B = Backup (to supplement plan A, not to replace it) Only use your albuterol inhaler as a rescue medication to be used if you can't catch your breath by resting or doing a relaxed purse lip breathing pattern.  - The less you use it, the better it will work when you need it. - Ok to use the inhaler up to 2 puffs  every 4 hours if you must but call for appointment if use goes up over your usual need - Don't leave home without it !!  (think of it like the spare tire for your car)   Plan C = Crisis (instead of Plan B but only if Plan B stops working) - only use your albuterol nebulizer if you first try Plan B and it fails to help > ok to use the nebulizer up to every 4 hours but if start needing it regularly call for immediate appointment   Please schedule a follow up office visit in 4 weeks, sooner if needed

## 2021-01-17 NOTE — Progress Notes (Signed)
Jeffrey [name], male    DOB: May 06, 1961 .   MRN: [id number]   Brief patient profile:  59 yowm MM/quit smoking 09/2019 at 5lpm 24/7 and not much better since self referred to [facility] clinic 03/29/2020 p prior rx by [place]/ [facility]   PFT 10/09/16 >> FEV1 1.84 (49%), FEV1% 48, TLC 7.39 (105%), DLCO 36%  Spirometry 02/02/17 >> FEV1 2.00 (55%), FEV1% 54     Admission date:  12/24/2019   Discharge Date:  01/10/2020   Primary MD  Gareth Morgan, MD   Discharge Diagnosis  Encephalopathy [G93.40] History of ETT [Z92.89] Acute on chronic respiratory failure with hypercapnia (HCC) [J96.22] Acute respiratory failure with hypoxia and hypercapnia (HCC) [J96.01, J96.02]   Active Problems:   Bipolar disorder with severe depression (HCC)   Essential hypertension   COPD with acute exacerbation (HCC)   Acute on chronic respiratory failure with hypercapnia (HCC)   Acute on chronic respiratory failure with hypoxia and hypercapnia (HCC)   Lobar pneumonia (HCC)   Acute metabolic encephalopathy   Acute on chronic diastolic CHF (congestive heart failure) (HCC)   Aspiration pneumonitis (HCC)        HPI  from the history and physical done on the day of admission:    Chief Complaint:AMS,difficulty breathing  HPI:  Per chart review patient was brought into the ED reports of difficulty breathing x 3 days, with confusion, and in the ED patient was tremulous.  Patient's ex-wife who now lives with patient tells me patient has not had any drink or use any drugs inthe past year. She reports increasing difficulty breathing and productive cough. No vomiting no loose stools. She said the past 3 days when patient was confused tells when he said he wanted someone to shoot him. She otherwise denies suicidal ideations. She also reports patient has gotten tired of using oxygen, his bronchodilators, and lying around as normally he likes to be active.  Admission11/19- 11/74for acute hypercapnic  respiratory failure secondary to COPD exacerbation and component of CHF.RequiredPrecedex drip for agitation.Discharged home on 3 to 4 L nasal cannula which was his baseline.  ED Course:Tachypneic, O2 sats 91 to 95% on nasal cannula 4 L. But with worsening mental status, patient not responding, and thenagitated, pullingat lines and tubes, decision was made to intubate patient as patient would not tolerate BiPAP. And sedated with propofol. ABG showed pH of 7.3, marked elevated PCO2 of 107. PaO2 <31. Covid test negative. Portable chest x-ray shows left greater than right base airspace disease, aspiration orinfection favored. IV vancomycin and cefepime started for HCAP.     Hospital Course:    Brief Narrative: 60 year old male with history of COPD is chronically on 3 to 4 L of oxygen, admitted with worsening shortness of breath. Felt to be related to COPD exacerbation. He was intubated in the emergency room and admitted to the ICU. Fortunately, he was able to extubate on 12/26/19. Hospital course further complicated by development of acute metabolic encephalopathy. Etiology is unclear, but may be related to high-dose steroids. Required Precedex infusion. Steroids have been tapered off ---Mental status overall much improved,  -Patient is cooperative, talkative and mostly appropriate -Was able to wean off Precedex drip on 01/04/2020-  -Patient is medically stable for discharge - Transfer to SNF rehab   Assessment & Plan: 1)Acute on chronic respiratory failure with hypoxia-ABG suggested mild hypercapnia as well----- secondary to COPD exacerbation and aspiration pneumonia. Patient was intubated on admission (12/24/19), but was able to extubate 12/26/19. Overall respiratory status  appears to be stable. Currently, he is requiring 2 L, which is back to his baseline PTA  2) acute metabolic encephalopathy with agitation----patient with underlying history of bipolar disorder, was  on high-dose steroids for COPD exacerbation respiratory failure, - -[facility] and PCCM consult appreciated for management of presumed steroid-induced psychosis, treated with Precedex drip and as needed Geodon -----Mental status overall much improved. -No longer requiring IV medications for sedation -Patient is cooperative, talkative and mostly appropriate -Was able to wean off Precedex drip on 01/04/2020-  -Patient with history of EtOH abuse, denies recent EtOH use -As per daughter Ms. 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 twice daily -May use as needed ODT Zyprexa --Occasional episodes of restlessness, patient is usually redirectable -- may need further adjustments of antipsychotic/psychiatric medications to optimize mood and behavior  3)Acute COPD exacerbation secondary to presumed aspiration pneumonia --- extubated 12/26/2019 , c =-ABG was suggestive of hypoxia and hypercapnia, completed antibiotics already, prednisone discontinued 01/04/2020  4)Bipolar Disorder- Continue home psychotropic medications including olanzapine and Depakote, also continue Prozac, lorazepam and trazodone,--- additional psych medication adjustments as above ----Mental status overall much improved,  -Not requiring IV medications for sedation -Patient is cooperative, talkative and mostly appropriate ----Occasional episodes of restlessness, patient is usually redirectable -- may need further adjustments of antipsychotic/psychiatric medications to optimize mood and behavior  5)Hypertension---.c/nmetoprolol to 12.5mg  twice daily,   6)Dysphonia/Dysphagia--- voice is a bit louder, patient voice is  soft/muffled apparently this is not new (initially worsened post intubation) -Speech pathology eval  appreciated recommends-Dysphagia 3 (mechanical soft); liquids have been changed to thin liquids as of 01/05/2020 from nectar-thick liquid  7)[name]--stable, continue atorvastatin and aspirin  8)Social/Ethics-- Discussed with Daughter [name] -[phone] [facility]), Pt is a Full code  9)Tobacco Abuse----smoking cessation        History of Present Illness  03/29/2020  Pulmonary/ 1st office eval/Meleny Tregoning  Chief Complaint  Patient presents with  . Follow-up    sob with exertion.  sob with sitting still talking.  dry cough from time to time.  Dyspnea: Tol  Very little activity, MB maybe 345ft hasn't done x sev weeks prior to OV  Stop half way to catch breath  Cough: not much now/ minimal white mucus   Sleep: bed is flat / two pillows  SABA use: twice daily / neb 2x daily  02  5lpm at home with high 90's and then walk on 3lpm POC  rec Plan A = Automatic = Always=    Trelegy one click each am - take two good deep drags and out thru nose  Plan B = Backup (to supplement plan A, not to replace it) Only use your albuterol inhaler as a rescue medication to be used if you can't catch your breath by resting or doing a relaxed purse lip breathing pattern.  - The less you use it, the better it will work when you need it. - Ok to use the inhaler up to 2 puffs  every 4 hours if you must but call for appointment if use goes up over your usual need - Don't leave home without it !!  (think of it like the spare tire for your car)  Plan C = Crisis (instead of Plan B but only if Plan B stops working) - only use your albuterol nebulizer  if you first try Plan B and it fails to help > ok to use the nebulizer up to every 4 hours but if start needing it regularly call for immediate appointment Make sure you check your oxygen saturations at highest level of activity to be sure it stays over 90% (low 90s is perfect) and adjust upward to maintain this level if needed but remember to turn it back to previous settings  when you stop (to conserve your supply).  Please schedule a follow up office visit in 4 weeks, sooner if needed  with all respiratory  medications /inhalers/ solutions in hand so we can verify exactly what you are taking. This includes all medications from all doctors and over the counters Add cxr needed   Also added best fit for amb 02 order     04/26/2020  f/u ov/Kylan Liberati re: GOLD II/III 02 on trelegy daily  S/p moderna Chief Complaint  Patient presents with  . Follow-up    Breathing has improved some since the last visit. His voice is back to normal.   Dyspnea:  Shower is easier but not the mb and desats on 3lpm POC but not adjusting  Cough: very seldom  Sleeping: bed is flat / 2 pillows SABA use: once a day 02: 3lpm conc/ POC  3lpm walking but it will go to 5 and never checks sats  rec Plan A = Automatic = Always=    trelegy is first thing in am  One click and two good drags Plan B = Backup (to supplement plan A, not to replace it) Only use your albuterol (Ventolin)inhaler as a rescue medication  Plan C = Crisis (instead of Plan B but only if Plan B stops working) - only use your albuterol nebulizer if you first try Plan B and it fails to help    07/31/2020  f/u ov/[place] office/Aarini Slee re: GOLD II/III 02 on trelegy daily Chief Complaint  Patient presents with  . Follow-up   Dyspnea: MMRC3 = can't walk 100 yards even at a slow pace at a flat grade s stopping due to sob Cough: some rattling thick mucus/  Sleeping: bed is flat / 3 pillows  SABA use: still sev times per day hfa but not neb  02: 4lpm 24/7  rec Prednisone 10 mg take  4 each am x 2 days,   2 each am x 2 days,  1 each am x 2 days and stop  I emphasized that nasal steroids (flonase/nasacort)  have no immediate benefit  Recommend the booster of moderna when it is offered probably this fall  The key is to stop smoking completely before smoking completely stops you! For cough congestion > mucinex dm 1200 mg every 12 hours is  the maximum dose  Make sure you check your oxygen saturations at highest level of activity  Please schedule a follow up visit in 3 months but call sooner if needed   See admit  Admit date: 09/29/2020 Discharge date: 10/09/2020 Discharge Diagnosis:   Active Problems:   Dyslipidemia   COPD exacerbation (HCC)   Essential hypertension   GERD (gastroesophageal reflux disease)   Acute on chronic respiratory failure with hypoxia and hypercapnia (HCC)   Acute metabolic encephalopathy   Lactic acidosis   Hyponatremia   Hyperglycemia   Elevated brain natriuretic peptide (BNP) level   (HFpEF) heart failure with preserved ejection fraction (HCC)   DNR (do not resuscitate) discussion   Pressure injury of skin   Discharge Condition: Improved.  Diet recommendation: Low sodium, heart healthy.    Wound care: None.  Code status: Full.   History of Present Illness:   60 year  old male with past medical history of COPD on 3 to 4 L of oxygen at home, depression, bipolar disorder, chronic diastolic heart failure, hepatic encephalopathy and polysubstance abuse was brought into the hospital with respiratory distress.  Patient was then admitted hospital for acute hypoxic and hypercarbic respiratory failure requiring intubation.  Patient was initially at [facility] and was extubated after which he was put on BiPAP.  Hospital course was complicated by metabolic encephalopathy requiring Precedex drip including Ativan and fentanyl.  Patient was subsequently transferred from any pain to [facility] ICU since her family wished aggressive care.  Hospital Course:   Following conditions were addressed during hospitalization as listed below,  Acute on chronic hypoxic and hypercarbic respiratory failure secondary to COPD exacerbation Improved clinically.  Initially was on mechanical ventilation followed by BiPAP.  At this time patient has been assessed for trilogy on discharge.  Will  be continued on oxygen supplementation discharge as well.  Patient has completed antibiotic course.    Pulmonary edema/Acute on chronic diastolic CHF Received IV Lasix with improvement.  Currently evolving.  Acute metabolic encephalopathy This was likely multifactorial, hypoxia, ICU delirium, steroids, withdrawal from benzos.  Improved during hospitalization.  Initially required Precedex drip.  Subsequently was restarted on valproic acid and Prozac from home.  Hypokalemia Replenished.  Anemia of critical illness and chronic disease Remained stable.  Bipolar disorder/Depression -Restarted on valproic acid and Prozac.   11/04/2020  f/u ov/[place] office/Phineas Mcenroe re:  COPD with GOLD III    spirometry / 02 dep @ 24/7 - says off cigs x one year Chief Complaint  Patient presents with  . Follow-up    Shortness of breath activity  Dyspnea: 50 - 100 ft  Cough: none  Sleeping: flat / 3 pillows  SABA use: none  02:  4llpm 24/7 / cpap per PCP  rec Plan A = Automatic = Always=    Trelegy one click each am  Plan B = Backup (to supplement plan A, not to replace it) Only use your albuterol inhaler as a rescue medication  Plan C = Crisis (instead of Plan B but only if Plan B stops working) - only use your albuterol nebulizer if you first try Plan B   01/03/21  ER eval for aecopd > medrol dose pack   01/07/2021  f/u ov/Deajah Erkkila re: GOLD III copd downhill x one year/ smoking again  Chief Complaint  Patient presents with  . Acute Visit    Increased SOB, wheezing and cough x 2 wks- worse over the past wk. He is winded with just walking.   Dyspnea:  Ok at rest but sob with any other activity = MMRC4  = sob if tries to leave home or while getting dressed   Cough: more congested > producing grey x 2 weeks  Sleeping:couch/  wedge pillow  And cpap "wears it some"  SABA use: albuterol 2 puffs 1.5 h prior to ov 02: 4lpm 24/7 Covid status:  vax x 3  rec Depomedrol 120 mg IM  Stop trelegy and  propranolol  Plan A = Automatic = Always=    Performist and yupelri  And 12 hours later just the perfomist Plan B = Backup (to supplement plan A, not to replace it) Only use your albuterol inhaler(RED/proair) as a rescue medication Plan C = Crisis (instead  of Plan B but only if Plan B stops working) - only use your albuterol nebulizer if you first try Plan B  If getting worse return to the hospital and no smoking at all.  Late add: doxy 100 mg bid x 7 days  See me in one week in [place] and go to ER if worse at all    01/17/2021  f/u ov/[place] office/Keyasia Jolliff re: GOLD II/III but 02 dep  Plus MS mod/severe with severe LAE rx by cards 2/24 rec increase lasix / off cigs/ on patch  Chief Complaint  Patient presents with  . Follow-up    No complaints currently   Dyspnea:  Walking up 9 steps (one flight) on 02/ all around house where now living with daugher and not smoking overall much better  Cough: none  Sleeping: on recliner at 30 degrees  SABA use: none at all  02: 4lpm  Covid status: vax x 3    No obvious day to day or daytime variability or assoc excess/ purulent sputum or mucus plugs or hemoptysis or cp or chest tightness, subjective wheeze or overt sinus or hb symptoms.   sleeping without nocturnal  or early am exacerbation  of respiratory  c/o's or need for noct saba. Also denies any obvious fluctuation of symptoms with weather or environmental changes or other aggravating or alleviating factors except as outlined above   No unusual exposure hx or h/o childhood pna/ asthma or knowledge of premature birth.  Current Allergies, Complete Past Medical History, Past Surgical History, Family History, and Social History were reviewed in [employer] record.  ROS  The following are not active complaints unless bolded Hoarseness, sore throat, dysphagia, dental problems, itching, sneezing,  nasal congestion or discharge of excess mucus or purulent secretions, ear ache,    fever, chills, sweats, unintended wt loss or wt gain, classically pleuritic or exertional cp,  orthopnea pnd or arm/hand swelling  or leg swelling, presyncope, palpitations, abdominal pain, anorexia, nausea, vomiting, diarrhea  or change in bowel habits or change in bladder habits, change in stools or change in urine, dysuria, hematuria,  rash, arthralgias, visual complaints, headache, numbness, weakness or ataxia or problems with walking or coordination,  change in mood or  memory.        Current Meds  Medication Sig  . acetaminophen (TYLENOL) 325 MG tablet Take 2 tablets (650 mg total) by mouth every 6 (six) hours as needed for mild pain, fever or headache (or Fever >/= 101).  Marland Kitchen albuterol (PROVENTIL) (2.5 MG/3ML) 0.083% nebulizer solution Take 3 mLs (2.5 mg total) by nebulization every 4 (four) hours as needed for wheezing or shortness of breath.  Marland Kitchen albuterol (VENTOLIN HFA) 108 (90 Base) MCG/ACT inhaler Inhale 2 puffs into the lungs every 4 (four) hours as needed for wheezing or shortness of breath.  Marland Kitchen aspirin EC 81 MG tablet Take 1 tablet (81 mg total) by mouth daily with breakfast.  . atorvastatin (LIPITOR) 20 MG tablet Take 1 tablet (20 mg total) by mouth daily. For high cholesterol (Patient taking differently: Take 20 mg by mouth every evening. For high cholesterol)  . bisoprolol (ZEBETA) 5 MG tablet Take 1 tablet (5 mg total) by mouth daily.  Marland Kitchen diltiazem (CARDIZEM CD) 180 MG 24 hr capsule Take 180 mg by mouth daily.  . divalproex (DEPAKOTE ER) 500 MG 24 hr tablet Take 3 tablets (1,500 mg total) by mouth at bedtime.  Marland Kitchen doxycycline (VIBRA-TABS) 100 MG tablet Take 1 tablet (100 mg  total) by mouth 2 (two) times daily.  Marland Kitchen FLUoxetine (PROZAC) 20 MG capsule Take 1 capsule (20 mg total) by mouth daily.  . folic acid (FOLVITE) 1 MG tablet Take 1 mg by mouth every morning.   . formoterol (PERFOROMIST) 20 MCG/2ML nebulizer solution Take 2 mLs (20 mcg total) by nebulization 2 (two) times daily.  .  furosemide (LASIX) 40 MG tablet TAKE 40 MG DAILY MAY TAKE ADDITIONAL 20 MG AS NEEDED  . gabapentin (NEURONTIN) 400 MG capsule Take 400 mg by mouth 3 (three) times daily.  Marland Kitchen guaiFENesin (MUCINEX) 600 MG 12 hr tablet Take 1 tablet (600 mg total) by mouth 2 (two) times daily as needed for cough or to loosen phlegm.  . Multiple Vitamin (MULTIVITAMIN) tablet Take 1 tablet by mouth daily.  Marland Kitchen OLANZapine (ZYPREXA) 10 MG tablet Take 1 tablet (10 mg total) by mouth 2 (two) times daily.  Marland Kitchen oxymetazoline (AFRIN) 0.05 % nasal spray Place 1 spray into both nostrils daily.  . pantoprazole (PROTONIX) 20 MG tablet Take 20 mg by mouth daily.  . potassium chloride SA (KLOR-CON) 20 MEQ tablet Take 20 mEq by mouth daily.  . revefenacin (YUPELRI) 175 MCG/3ML nebulizer solution Take 3 mLs (175 mcg total) by nebulization daily.  Marland Kitchen thiamine 100 MG tablet Take 100 mg by mouth daily.  . traZODone (DESYREL) 50 MG tablet Take 1 tablet (50 mg total) by mouth at bedtime.  . vitamin B-12 (CYANOCOBALAMIN) 500 MCG tablet Take 1,000 mcg by mouth every morning.                          Past Medical History:  Diagnosis Date  . Acute blood loss anemia 02/07/2017  . Anxiety   . Arthritis    deg disease, bulging disk,  shoulder level  . Bipolar disorder (HCC)   . Bipolar disorder (HCC)   . CHF (congestive heart failure) (HCC)   . 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 Depression    anxiety  . Hyperlipidemia   . Hypertension   . Peptic ulcer disease    Review  . Pneumonia   . [name] (peripheral vascular disease) (HCC) 06/18/2015  . Shortness of breath            Objective:     01/17/2021       189 01/07/2021       175 11/04/2020     178   04/26/2020        181   03/29/20 170 lb (77.1 kg)  01/16/20 143 lb 4.8 oz (65 kg)  01/10/20 143 lb 11.8 oz (65.2 kg)     Vital signs reviewed  01/17/2021  - Note at rest 02 sats  98% on 4lpm    General appearance:    Much brighter affect, now ambulatory/ nad   HEENT : pt  wearing mask not removed for exam due to covid -19 concerns.    NECK :  without JVD/Nodes/TM/ nl carotid upstrokes bilaterally   LUNGS: no acc muscle use,  Mod barrel  contour chest wall with bilateral  Distant bs s audible wheeze and  without cough on insp or exp maneuvers and mod  Hyperresonant  to  percussion bilaterally     CV:  RRR  no s3 or murmur or increase in P2, and no edema   ABD:  soft and nontender with pos mid insp Hoover's  in the supine position. No bruits or organomegaly appreciated, bowel sounds nl  MS:     ext warm without deformities, calf tenderness, cyanosis or clubbing No obvious joint restrictions   SKIN: warm and dry without lesions    NEURO:  alert, approp, nl sensorium with  no motor or cerebellar deficits apparent.                   Assessment

## 2021-01-18 ENCOUNTER — Encounter: Payer: Self-pay | Admitting: Internal Medicine

## 2021-01-18 NOTE — Assessment & Plan Note (Signed)
HC03 level  01/16/20  = 32  -  03/29/2020  Walked slow pace on 5lpm POC with sats starting at 92 and dropping to 82%  -  07/31/2020   Walked RA  approx   100 ft  @ slow pace  stopped due to  Sob on 4lpm but sats still 93%   -  HC03  10/07/20  =  31  -  HC03   01/03/21    = 44   Adequate control on present rx, reviewed in detail with pt >  Ok to titrate daytime for sats in lower 90s since he is hypercarbic but as long as no noct resp symptoms or am ha/ams then just use 4lpm hs as his default

## 2021-01-18 NOTE — Assessment & Plan Note (Signed)
Quit smoking 01/07/21   PFT 10/09/16 >> FEV1 1.84 (49%), FEV1% 48, TLC 7.39 (105%), DLCO 36% A1AT 10/09/16 >> 215, MM Spirometry 02/02/17 >> FEV1 2.00 (55%), FEV1% 54 - 03/29/2020  After extensive coaching inhaler device,  effectiveness =    90% with elipta, 75% with hfa > trelegy  >>  improved 04/26/2020    - 01/07/2021 d/c'd trelegy due to hoarseness > converted to performist/yupelri  And prn saba - 01/17/2021  After extensive coaching inhaler device,  effectiveness =    75% (short Ti) start breztri 2bid x 4 weeks sample then ov    Group D in terms of symptom/risk and laba/lama/ICS  therefore appropriate rx at this point >>>  Breztri/ prn saba   I spent extra time with pt today reviewing appropriate use of albuterol for prn use on exertion with the following points: 1) saba is for relief of sob that does not improve by walking a slower pace or resting but rather if the pt does not improve after trying this first. 2) If the pt is convinced, as many are, that saba helps recover from activity faster then it's easy to tell if this is the case by re-challenging : ie stop, take the inhaler, then p 5 minutes try the exact same activity (intensity of workload) that just caused the symptoms and see if they are substantially diminished or not after saba 3) if there is an activity that reproducibly causes the symptoms, try the saba 15 min before the activity on alternate days   If in fact the saba really does help, then fine to continue to use it prn but advised may need to look closer at the maintenance regimen being used to achieve better control of airways disease with exertion.

## 2021-01-18 NOTE — Assessment & Plan Note (Signed)
Echo 11/12/21diastolic dysfunction with AR and severe LAE   Well compensated at present, no peripheral edema/ bp in good shape          Each maintenance medication was reviewed in detail including emphasizing most importantly the difference between maintenance and prns and under what circumstances the prns are to be triggered using an action plan format where appropriate.  Total time for H and P, chart review, counseling,   and generating customized AVS unique to this office visit / same day charting = 22 min

## 2021-01-22 ENCOUNTER — Telehealth (INDEPENDENT_AMBULATORY_CARE_PROVIDER_SITE_OTHER): Payer: Medicare Other | Admitting: Psychiatry

## 2021-01-22 ENCOUNTER — Other Ambulatory Visit: Payer: Self-pay

## 2021-01-22 ENCOUNTER — Encounter (HOSPITAL_COMMUNITY): Payer: Self-pay | Admitting: Psychiatry

## 2021-01-22 DIAGNOSIS — F3162 Bipolar disorder, current episode mixed, moderate: Secondary | ICD-10-CM | POA: Diagnosis not present

## 2021-01-22 MED ORDER — OLANZAPINE 10 MG PO TABS: 10.0000 mg | ORAL_TABLET | Freq: Two times a day (BID) | ORAL | 2 refills | Status: AC

## 2021-01-22 MED ORDER — TRAZODONE HCL 50 MG PO TABS: 50.0000 mg | ORAL_TABLET | Freq: Every day | ORAL | 2 refills | Status: AC

## 2021-01-22 MED ORDER — DIVALPROEX SODIUM ER 500 MG PO TB24: 1500.0000 mg | ORAL_TABLET | Freq: Every day | ORAL | 2 refills | Status: AC

## 2021-01-22 MED ORDER — FLUOXETINE HCL 20 MG PO CAPS: 20.0000 mg | ORAL_CAPSULE | Freq: Every day | ORAL | 2 refills | Status: AC

## 2021-01-22 NOTE — Progress Notes (Signed)
Virtual Visit via Telephone Note  I connected with Jeffrey [name] on 01/22/21 at 11:40 AM EST by telephone and verified that I am speaking with the correct person using two identifiers.  Location: Patient: home Provider: home   I discussed the limitations, risks, security and privacy concerns of performing an evaluation and management service by telephone and the availability of in person appointments. I also discussed with the patient that there may be a patient responsible charge related to this service. The patient expressed understanding and agreed to proceed.    I discussed the assessment and treatment plan with the patient. The patient was provided an opportunity to ask questions and all were answered. The patient agreed with the plan and demonstrated an understanding of the instructions.   The patient was advised to call back or seek an in-person evaluation if the symptoms worsen or if the condition fails to improve as anticipated.  I provided 15 minutes of non-face-to-face time during this encounter.   Jeffrey Ruder, MD  [facility] MD/PA/NP OP Progress Note  01/22/2021 12:12 PM Jeffrey [name]  MRN:  [id number]  Chief Complaint:  Chief Complaint    Anxiety; Depression; Manic Behavior; Follow-up     HPI: This this patient is a 60 year old white male who is currently living with his ex-wife in [place].  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  I have spoken today with both him and his daughter.  He states that he is doing better and his mood is fairly stable.  He is sleeping and eating well and in fact has gained some weight.  However his daughter reports that when he stays with his ex-wife he does not do very well and reverts back to smoking.  His pulmonary specialist is told him that if he continues to smoke he is definitely going to die.  He has not smoked in 2 weeks but this is mostly because his daughter  has made him stay with her.  The patient complains of a shaking tremor "throughout my whole body."  His daughter is noticed it also and states it is worse in his hands and is constantly dropping things and having trouble reaching for things.  He does not have bradykinesia by her report.  He wants me to prescribe something for this but I want him to be seen by primary care and/or neurology to see what is really going on here.  We will also check a Depakote level to make sure this is not the cause of the tremor although it has never been high for him. Visit Diagnosis:    ICD-10-CM   1. Moderate mixed bipolar I disorder (HCC)  F31.62     Past Psychiatric History: Several hospitalizations for depression, last one in March 2019 for suicide attempt  Past Medical History:  Past Medical History:  Diagnosis Date  . Acute blood loss anemia 02/07/2017  . Anxiety   . Arthritis    deg disease, bulging disk,  shoulder level  . Bipolar disorder (HCC)   . Bipolar disorder (HCC)   . CHF (congestive heart failure) (HCC)   . 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 Depression    anxiety  . Hyperlipidemia   . Hypertension   . Peptic ulcer disease    Review  . Pneumonia   . [name] (peripheral vascular disease) (HCC) 06/18/2015  . Shortness of breath     Past  Surgical History:  Procedure Laterality Date  . BIOPSY  02/09/2017   Procedure: BIOPSY;  Surgeon: West BaliSandi L Fields, MD;  [facility];  Service: Endoscopy;;  duodenal gastric  . COLONOSCOPY  03/14/2007   WUJ:WJXBJYR:Normal colonoscopy and terminal ileoscopy except external hemorrhoids  . COLONOSCOPY N/A 05/05/2013   Dr. Jena Gaussourk: external/internal anal canal hemorrhoids, unable to intubate TI, segemental biopsies unremarkable   . COLONOSCOPY N/A 04/11/2017   Procedure: COLONOSCOPY;  Surgeon: West BaliFields, Sandi L, MD;  Location: [facility];  Service: Endoscopy;  Laterality: N/A;  . COLONOSCOPY WITH PROPOFOL N/A 03/31/2017   Procedure: COLONOSCOPY WITH PROPOFOL;   Surgeon: Corbin Adeourk, Robert M, MD;  [facility]: [facility];  Service: Endoscopy;  Laterality: N/A;  1:45pm  . ESOPHAGOGASTRODUODENOSCOPY  03/14/2007   NWG:[id number]:Nonerosive antral gastritis with bulbar duodenitis/paucity to postbulbar duodenal folds and biopsy were benign with no evidence of villous atrophy.  . ESOPHAGOGASTRODUODENOSCOPY (EGD) WITH PROPOFOL N/A 02/09/2017   Procedure: ESOPHAGOGASTRODUODENOSCOPY (EGD) WITH PROPOFOL;  Surgeon: West BaliSandi L Fields, MD;  [facility];  Service: Endoscopy;  Laterality: N/A;  . GIVENS CAPSULE STUDY N/A 04/08/2017   Procedure: GIVENS CAPSULE STUDY;  Surgeon: Corbin Adeourk, Robert M, MD;  [facility];  Service: Endoscopy;  Laterality: N/A;  . HAND SURGERY     left, secondary to self-inflicted laceration  . HEMORRHOID SURGERY N/A 04/14/2017   Procedure: HEMORRHOIDECTOMY;  Surgeon: Franky MachoJenkins, Mark, MD;  Location: [facility];  Service: General;  Laterality: N/A;  . SHOULDER SURGERY     right  . TOE SURGERY     left great toe , amputated-lawnmover accident    Family Psychiatric History: see below  Family History:  Family History  Problem Relation Age of Onset  . Breast cancer Mother        deceased  . Heart disease Father   . Depression Daughter   . Anxiety disorder Daughter   . Anxiety disorder Son   . Depression Son   . Asthma Brother   . Heart attack Maternal Aunt   . Heart attack Maternal Uncle   . Heart attack Paternal Aunt   . Heart attack Paternal Uncle   . Heart attack Maternal Grandmother   . Heart attack Maternal Grandfather   . Emphysema Maternal Grandfather   . Heart attack Paternal Grandmother   . Heart attack Paternal Grandfather   . Colon cancer Neg Hx   . Liver disease Neg Hx     Social History:  Social History   Socioeconomic History  . Marital status: Divorced    Spouse name: Not on file  . Number of children: 2  . Years of education: Not on file  . Highest education level: Not on file  Occupational History  .  Occupation: Disabled  Tobacco Use  . Smoking status: Former Smoker    Packs/day: 1.00    Years: 40.00    Pack years: 40.00    Types: Cigarettes    Start date: 08/04/1971    Quit date: 01/09/2021    Years since quitting: 0.0  . Smokeless tobacco: Former NeurosurgeonUser    Quit date: 09/30/2019  . Tobacco comment: peak rate of 2.5ppd, 1/2ppd on 11/27/2016 -- 6 cigarettes / day 01/25/18  Vaping Use  . Vaping Use: Never used  Substance and Sexual Activity  . Alcohol use: No    Alcohol/week: 0.0 standard drinks  . Drug use: Not Currently    Types: Marijuana    Comment: most days   . Sexual activity:  Not Currently  Other Topics Concern  . Not on file  Social History Narrative   Originally from [place]. Previously has lived in [facility] 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 Questionable asbestos exposure. Does have significant exposure to fumes. No mold exposure. No bird exposure. No pets currently.  Pt lives alone in [place].   Social Determinants of Health   Financial Resource Strain: Not on file  Food Insecurity: Not on file  Transportation Needs: Not on file  Physical Activity: Not on file  Stress: Not on file  Social Connections: Not on file    Allergies:  Allergies  Allergen Reactions  . Augmentin [Amoxicillin-Pot Clavulanate] Rash and Other (See Comments)    Tolerated Zosyn 12/27/2019 Has patient had a PCN reaction causing immediate rash, facial/tongue/throat swelling, SOB or lightheadedness with hypotension: No Has patient had a PCN reaction causing severe rash involving mucus membranes or skin necrosis: No Has patient had a PCN reaction that required hospitalization No Has patient had a PCN reaction occurring within the last 10 years: Yes If all of the above answers are "NO", then may proceed with Cephalosporin use.  . Ace Inhibitors Hives    Metabolic Disorder Labs: No results found for: HGBA1C, MPG No results  found for: PROLACTIN Lab Results  Component Value Date   TRIG 101 10/02/2020   Lab Results  Component Value Date   TSH 1.552 05/01/2019   TSH 0.787 12/22/2018    Therapeutic Level Labs: No results found for: LITHIUM Lab Results  Component Value Date   VALPROATE 17 (L) 01/16/2020   VALPROATE 31 (L) 12/24/2019   No components found for:  CBMZ  Current Medications: Current Outpatient Medications  Medication Sig Dispense Refill  . acetaminophen (TYLENOL) 325 MG tablet Take 2 tablets (650 mg total) by mouth every 6 (six) hours as needed for mild pain, fever or headache (or Fever >/= 101). 12 tablet 0  . albuterol (PROVENTIL) (2.5 MG/3ML) 0.083% nebulizer solution Take 3 mLs (2.5 mg total) by nebulization every 4 (four) hours as needed for wheezing or shortness of breath. 75 mL 12  . albuterol (VENTOLIN HFA) 108 (90 Base) MCG/ACT inhaler Inhale 2 puffs into the lungs every 4 (four) hours as needed for wheezing or shortness of breath. 6.7 g 3  . aspirin EC 81 MG tablet Take 1 tablet (81 mg total) by mouth daily with breakfast. 30 tablet 4  . atorvastatin (LIPITOR) 20 MG tablet Take 1 tablet (20 mg total) by mouth daily. For high cholesterol (Patient taking differently: Take 20 mg by mouth every evening. For high cholesterol) 30 tablet 0  . bisoprolol (ZEBETA) 5 MG tablet Take 1 tablet (5 mg total) by mouth daily. 30 tablet 5  . Budeson-Glycopyrrol-Formoterol (BREZTRI AEROSPHERE) 160-9-4.8 MCG/ACT AERO Take 2 puffs first thing in am and then another 2 puffs about 12 hours later. 10.7 g 11  . diltiazem (CARDIZEM CD) 180 MG 24 hr capsule Take 180 mg by mouth daily.    . divalproex (DEPAKOTE ER) 500 MG 24 hr tablet Take 3 tablets (1,500 mg total) by mouth at bedtime. 90 tablet 2  . doxycycline (VIBRA-TABS) 100 MG tablet Take 1 tablet (100 mg total) by mouth 2 (two) times daily. 14 tablet 0  . FLUoxetine (PROZAC) 20 MG capsule Take 1 capsule (20 mg total) by mouth daily. 30 capsule 2  . folic  acid (FOLVITE) 1 MG tablet Take 1 mg  by mouth every morning.     . furosemide (LASIX) 40 MG tablet TAKE 40 MG DAILY MAY TAKE ADDITIONAL 20 MG AS NEEDED 180 tablet 1  . gabapentin (NEURONTIN) 400 MG capsule Take 400 mg by mouth 3 (three) times daily.    Marland Kitchen guaiFENesin (MUCINEX) 600 MG 12 hr tablet Take 1 tablet (600 mg total) by mouth 2 (two) times daily as needed for cough or to loosen phlegm. 20 tablet 0  . Multiple Vitamin (MULTIVITAMIN) tablet Take 1 tablet by mouth daily.    Marland Kitchen OLANZapine (ZYPREXA) 10 MG tablet Take 1 tablet (10 mg total) by mouth 2 (two) times daily. 60 tablet 2  . oxymetazoline (AFRIN) 0.05 % nasal spray Place 1 spray into both nostrils daily.    . pantoprazole (PROTONIX) 20 MG tablet Take 20 mg by mouth daily.    . potassium chloride SA (KLOR-CON) 20 MEQ tablet Take 20 mEq by mouth daily.    Marland Kitchen thiamine 100 MG tablet Take 100 mg by mouth daily.    . traZODone (DESYREL) 50 MG tablet Take 1 tablet (50 mg total) by mouth at bedtime. 30 tablet 2  . vitamin B-12 (CYANOCOBALAMIN) 500 MCG tablet Take 1,000 mcg by mouth every morning.      No current facility-administered medications for this visit.     Musculoskeletal: Strength & Muscle Tone: decreased Gait & Station: normal Patient leans: N/A  Psychiatric Specialty Exam: Review of Systems  Respiratory: Positive for shortness of breath.   Neurological: Positive for tremors.  All other systems reviewed and are negative.   There were no vitals taken for this visit.There is no height or weight on file to calculate BMI.  General Appearance: NA  Eye Contact:  NA  Speech:  Clear and Coherent  Volume:  Normal  Mood:  Anxious  Affect:  NA  Thought Process:  Goal Directed  Orientation:  Full (Time, Place, and Person)  Thought Content: Rumination   Suicidal Thoughts:  No  Homicidal Thoughts:  No  Memory:  Immediate;   Good Recent;   Fair Remote;   Poor  Judgement:  Poor  Insight:  Shallow  Psychomotor Activity:   Tremor  Concentration:  Concentration: Fair and Attention Span: Fair  Recall:  [employer] of Knowledge: Fair  Language: Good  Akathisia:  No  Handed:  Right  AIMS (if indicated): not done  Assets:  Communication Skills Desire for Improvement Resilience Social Support  ADL's:  Intact  Cognition: Impaired,  Mild  Sleep:  Good   Screenings: AIMS   Flowsheet Row [facility]  AIMS Total Score 0    AUDIT   Flowsheet Row [facility]  Alcohol Use Disorder Identification Test Final Score (AUDIT) 18    Flowsheet Row ED from 01/03/2021 in [facility]Y DEPART[facility] ED from 01/16/2020 in [facility] EM[facility]GENCY DEPART[facility]  C-SSRS RISK CATEGORY Error: Question 2 not populated Low Risk       Assessment and Plan: Patient is a 89-year-old male with a history of bipolar disorder polysubstance abuse in remission severe COPD.  He is complaining significantly of tremor.  I think this needs to be evaluated by primary care and then neurology.  His daughter is in agreement.  We will check a Depakote level.  For now he will continue Depakote ER 1500 mg at bedtime for mood stabilization, Prozac 40 mg daily for depression, gabapentin  600 mg twice daily for anxiety and olanzapine 10 mg twice daily for mood stabilization and trazodone 50 mg at bedtime for sleep.  He will return to see me in 3 months   Jeffrey Ruder, MD 01/22/2021, 12:12 PM

## 2021-01-23 ENCOUNTER — Other Ambulatory Visit (HOSPITAL_COMMUNITY): Payer: Self-pay | Admitting: Psychiatry

## 2021-01-23 DIAGNOSIS — F3162 Bipolar disorder, current episode mixed, moderate: Secondary | ICD-10-CM

## 2021-02-14 ENCOUNTER — Encounter: Payer: Self-pay | Admitting: Internal Medicine

## 2021-02-14 ENCOUNTER — Ambulatory Visit (INDEPENDENT_AMBULATORY_CARE_PROVIDER_SITE_OTHER): Payer: Medicare Other | Admitting: Internal Medicine

## 2021-02-14 ENCOUNTER — Other Ambulatory Visit: Payer: Self-pay

## 2021-02-14 DIAGNOSIS — J449 Chronic obstructive pulmonary disease, unspecified: Secondary | ICD-10-CM | POA: Diagnosis not present

## 2021-02-14 DIAGNOSIS — J9611 Chronic respiratory failure with hypoxia: Secondary | ICD-10-CM | POA: Diagnosis not present

## 2021-02-14 DIAGNOSIS — J9612 Chronic respiratory failure with hypercapnia: Secondary | ICD-10-CM

## 2021-02-14 MED ORDER — BREZTRI AEROSPHERE 160-9-4.8 MCG/ACT IN AERO: INHALATION_SPRAY | RESPIRATORY_TRACT | 11 refills | Status: AC

## 2021-02-14 NOTE — Patient Instructions (Addendum)
Make sure you check your oxygen saturation  at your highest level of activity  to be sure it stays over 90% and adjust  02 flow upward to maintain this level if needed but remember to turn it back to previous settings when you stop (to conserve your supply).    No change in your medications    Work on inhaler technique:  relax and gently blow all the way out then take a nice smooth deep breath back in, triggering the inhaler at same time you start breathing in.  Hold for up to 5 seconds if you can. Blow out thru nose. Rinse and gargle with water when done  Use arm and hammer toothpaste slurry after using your breztri   If still hoarse, then you will need to see ENT doctor   Please schedule a follow up visit in 3 months but call sooner if needed

## 2021-02-14 NOTE — Assessment & Plan Note (Signed)
HC03 level  01/16/20  = 32  -  03/29/2020  Walked slow pace on 5lpm POC with sats starting at 92 and dropping to 82%  -  07/31/2020   Walked RA  approx   100 ft  @ slow pace  stopped due to  Sob on 4lpm but sats still 93%   -  HC03  10/07/20  =  31  -  HC03   01/03/21    = 44  -  02/14/2021   Walked 4lpm POC  approx   150 ft  @ moderate  pace  stopped due to  Back hurting with sats still 91%    As of 02/14/2021 rec 2lpm rest/hs and 4lpm POC with acitivity with goal > 90% at all times   Each maintenance medication was reviewed in detail including emphasizing most importantly the difference between maintenance and prns and under what circumstances the prns are to be triggered using an action plan format where appropriate.  Total time for H and P, chart review, counseling, reviewing hfa  device(s) , directly observing portions of ambulatory 02 saturation study/ and generating customized AVS unique to this office visit / same day charting > 30 min

## 2021-02-14 NOTE — Assessment & Plan Note (Signed)
Quit smoking 01/07/21   PFT 10/09/16 >> FEV1 1.84 (49%), FEV1% 48, TLC 7.39 (105%), DLCO 36% A1AT 10/09/16 >> 215, MM Spirometry 02/02/17 >> FEV1 2.00 (55%), FEV1% 54 - 03/29/2020  After extensive coaching inhaler device,  effectiveness =    90% with elipta, 75% with hfa > trelegy  >>  improved 04/26/2020    - 01/07/2021 d/c'd trelegy due to hoarseness > converted to performist/yupelri  And prn saba - 01/17/2021   start breztri 2bid x 4 weeks sample then ov  - 02/14/2021  After extensive coaching inhaler device,  effectiveness =  75% (Short Ti)     Group D in terms of symptom/risk and laba/lama/ICS  therefore appropriate rx at this point >>>  Continue breztri and approp saba.  I spent extra time with pt today reviewing appropriate use of albuterol for prn use on exertion with the following points: 1) saba is for relief of sob that does not improve by walking a slower pace or resting but rather if the pt does not improve after trying this first. 2) If the pt is convinced, as many are, that saba helps recover from activity faster then it's easy to tell if this is the case by re-challenging : ie stop, take the inhaler, then p 5 minutes try the exact same activity (intensity of workload) that just caused the symptoms and see if they are substantially diminished or not after saba 3) if there is an activity that reproducibly causes the symptoms, try the saba 15 min before the activity on alternate days   If in fact the saba really does help, then fine to continue to use it prn but advised may need to look closer at the maintenance regimen being used to achieve better control of airways disease with exertion.

## 2021-02-14 NOTE — Progress Notes (Signed)
Jeffrey [name], male    DOB: 13-Feb-1961 .   MRN: [id number]   Brief patient profile:  59 yowm MM/quit smoking 09/2019 at 5lpm 24/7 and not much better since self referred to [facility] clinic 03/29/2020 p prior rx by [place]/ Nestor   PFT 10/09/16 >> FEV1 1.84 (49%), FEV1% 48, TLC 7.39 (105%), DLCO 36%  Spirometry 02/02/17 >> FEV1 2.00 (55%), FEV1% 54     Admission date:  12/24/2019   Discharge Date:  01/10/2020   Primary MD  Gareth Morgan, MD   Discharge Diagnosis  Encephalopathy [G93.40] History of ETT [Z92.89] Acute on chronic respiratory failure with hypercapnia (HCC) [J96.22] Acute respiratory failure with hypoxia and hypercapnia (HCC) [J96.01, J96.02]   Active Problems:   Bipolar disorder with severe depression (HCC)   Essential hypertension   COPD with acute exacerbation (HCC)   Acute on chronic respiratory failure with hypercapnia (HCC)   Acute on chronic respiratory failure with hypoxia and hypercapnia (HCC)   Lobar pneumonia (HCC)   Acute metabolic encephalopathy   Acute on chronic diastolic CHF (congestive heart failure) (HCC)   Aspiration pneumonitis (HCC)        HPI  from the history and physical done on the day of admission:    Chief Complaint:AMS,difficulty breathing  HPI:  Per chart review patient was brought into the ED reports of difficulty breathing x 3 days, with confusion, and in the ED patient was tremulous.  Patient's ex-wife who now lives with patient tells me patient has not had any drink or use any drugs inthe past year. She reports increasing difficulty breathing and productive cough. No vomiting no loose stools. She said the past 3 days when patient was confused tells when he said he wanted someone to shoot him. She otherwise denies suicidal ideations. She also reports patient has gotten tired of using oxygen, his bronchodilators, and lying around as normally he likes to be active.  Admission11/19- 11/21for acute hypercapnic  respiratory failure secondary to COPD exacerbation and component of CHF.RequiredPrecedex drip for agitation.Discharged home on 3 to 4 L nasal cannula which was his baseline.  ED Course:Tachypneic, O2 sats 91 to 95% on nasal cannula 4 L. But with worsening mental status, patient not responding, and thenagitated, pullingat lines and tubes, decision was made to intubate patient as patient would not tolerate BiPAP. And sedated with propofol. ABG showed pH of 7.3, marked elevated PCO2 of 107. PaO2 <31. Covid test negative. Portable chest x-ray shows left greater than right base airspace disease, aspiration orinfection favored. IV vancomycin and cefepime started for HCAP.     Hospital Course:    Brief Narrative: 60 year old male with history of COPD is chronically on 3 to 4 L of oxygen, admitted with worsening shortness of breath. Felt to be related to COPD exacerbation. He was intubated in the emergency room and admitted to the ICU. Fortunately, he was able to extubate on 12/26/19. Hospital course further complicated by development of acute metabolic encephalopathy. Etiology is unclear, but may be related to high-dose steroids. Required Precedex infusion. Steroids have been tapered off ---Mental status overall much improved,  -Patient is cooperative, talkative and mostly appropriate -Was able to wean off Precedex drip on 01/04/2020-  -Patient is medically stable for discharge - Transfer to SNF rehab   Assessment & Plan: 1)Acute on chronic respiratory failure with hypoxia-ABG suggested mild hypercapnia as well----- secondary to COPD exacerbation and aspiration pneumonia. Patient was intubated on admission (12/24/19), but was able to extubate 12/26/19. Overall respiratory status  appears to be stable. Currently, he is requiring 2 L, which is back to his baseline PTA  2) acute metabolic encephalopathy with agitation----patient with underlying history of bipolar disorder, was  on high-dose steroids for COPD exacerbation respiratory failure, - -[facility] and PCCM consult appreciated for management of presumed steroid-induced psychosis, treated with Precedex drip and as needed Geodon -----Mental status overall much improved. -No longer requiring IV medications for sedation -Patient is cooperative, talkative and mostly appropriate -Was able to wean off Precedex drip on 01/04/2020-  -Patient with history of EtOH abuse, denies recent EtOH use -As per daughter Ms. 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 twice daily -May use as needed ODT Zyprexa --Occasional episodes of restlessness, patient is usually redirectable -- may need further adjustments of antipsychotic/psychiatric medications to optimize mood and behavior  3)Acute COPD exacerbation secondary to presumed aspiration pneumonia --- extubated 12/26/2019 , c =-ABG was suggestive of hypoxia and hypercapnia, completed antibiotics already, prednisone discontinued 01/04/2020  4)Bipolar Disorder- Continue home psychotropic medications including olanzapine and Depakote, also continue Prozac, lorazepam and trazodone,--- additional psych medication adjustments as above ----Mental status overall much improved,  -Not requiring IV medications for sedation -Patient is cooperative, talkative and mostly appropriate ----Occasional episodes of restlessness, patient is usually redirectable -- may need further adjustments of antipsychotic/psychiatric medications to optimize mood and behavior  5)Hypertension---.c/nmetoprolol to 12.5mg  twice daily,   6)Dysphonia/Dysphagia--- voice is a bit louder, patient voice is  soft/muffled apparently this is not new (initially worsened post intubation) -Speech pathology eval  appreciated recommends-Dysphagia 3 (mechanical soft); liquids have been changed to thin liquids as of 01/05/2020 from nectar-thick liquid  7)[name]--stable, continue atorvastatin and aspirin  8)Social/Ethics-- Discussed with Daughter [name] -[phone] [facility]), Pt is a Full code  9)Tobacco Abuse----smoking cessation        History of Present Illness  03/29/2020  Pulmonary/ 1st office eval/Emmons Toth  Chief Complaint  Patient presents with  . Follow-up    sob with exertion.  sob with sitting still talking.  dry cough from time to time.  Dyspnea: Tol  Very little activity, MB maybe 346ft hasn't done x sev weeks prior to OV  Stop half way to catch breath  Cough: not much now/ minimal white mucus   Sleep: bed is flat / two pillows  SABA use: twice daily / neb 2x daily  02  5lpm at home with high 90's and then walk on 3lpm POC  rec Plan A = Automatic = Always=    Trelegy one click each am - take two good deep drags and out thru nose  Plan B = Backup (to supplement plan A, not to replace it) Only use your albuterol inhaler as a rescue medication to be used if you can't catch your breath by resting or doing a relaxed purse lip breathing pattern.  - The less you use it, the better it will work when you need it. - Ok to use the inhaler up to 2 puffs  every 4 hours if you must but call for appointment if use goes up over your usual need - Don't leave home without it !!  (think of it like the spare tire for your car)  Plan C = Crisis (instead of Plan B but only if Plan B stops working) - only use your albuterol nebulizer  if you first try Plan B and it fails to help > ok to use the nebulizer up to every 4 hours but if start needing it regularly call for immediate appointment Make sure you check your oxygen saturations at highest level of activity to be sure it stays over 90% (low 90s is perfect) and adjust upward to maintain this level if needed but remember to turn it back to previous settings  when you stop (to conserve your supply).  Please schedule a follow up office visit in 4 weeks, sooner if needed  with all respiratory  medications /inhalers/ solutions in hand so we can verify exactly what you are taking. This includes all medications from all doctors and over the counters Add cxr needed   Also added best fit for amb 02 order     04/26/2020  f/u ov/Nikeshia Keetch re: GOLD II/III 02 on trelegy daily  S/p moderna Chief Complaint  Patient presents with  . Follow-up    Breathing has improved some since the last visit. His voice is back to normal.   Dyspnea:  Shower is easier but not the mb and desats on 3lpm POC but not adjusting  Cough: very seldom  Sleeping: bed is flat / 2 pillows SABA use: once a day 02: 3lpm conc/ POC  3lpm walking but it will go to 5 and never checks sats  rec Plan A = Automatic = Always=    trelegy is first thing in am  One click and two good drags Plan B = Backup (to supplement plan A, not to replace it) Only use your albuterol (Ventolin)inhaler as a rescue medication  Plan C = Crisis (instead of Plan B but only if Plan B stops working) - only use your albuterol nebulizer if you first try Plan B and it fails to help    07/31/2020  f/u ov/[place] office/Nayelie Gionfriddo re: GOLD II/III 02 on trelegy daily Chief Complaint  Patient presents with  . Follow-up   Dyspnea: MMRC3 = can't walk 100 yards even at a slow pace at a flat grade s stopping due to sob Cough: some rattling thick mucus/  Sleeping: bed is flat / 3 pillows  SABA use: still sev times per day hfa but not neb  02: 4lpm 24/7  rec Prednisone 10 mg take  4 each am x 2 days,   2 each am x 2 days,  1 each am x 2 days and stop  I emphasized that nasal steroids (flonase/nasacort)  have no immediate benefit  Recommend the booster of moderna when it is offered probably this fall  The key is to stop smoking completely before smoking completely stops you! For cough congestion > mucinex dm 1200 mg every 12 hours is  the maximum dose  Make sure you check your oxygen saturations at highest level of activity  Please schedule a follow up visit in 3 months but call sooner if needed   See admit  Admit date: 09/29/2020 Discharge date: 10/09/2020 Discharge Diagnosis:   Active Problems:   Dyslipidemia   COPD exacerbation (HCC)   Essential hypertension   GERD (gastroesophageal reflux disease)   Acute on chronic respiratory failure with hypoxia and hypercapnia (HCC)   Acute metabolic encephalopathy   Lactic acidosis   Hyponatremia   Hyperglycemia   Elevated brain natriuretic peptide (BNP) level   (HFpEF) heart failure with preserved ejection fraction (HCC)   DNR (do not resuscitate) discussion   Pressure injury of skin   Discharge Condition: Improved.  Diet recommendation: Low sodium, heart healthy.    Wound care: None.  Code status: Full.   History of Present Illness:   60 year  old male with past medical history of COPD on 3 to 4 L of oxygen at home, depression, bipolar disorder, chronic diastolic heart failure, hepatic encephalopathy and polysubstance abuse was brought into the hospital with respiratory distress.  Patient was then admitted hospital for acute hypoxic and hypercarbic respiratory failure requiring intubation.  Patient was initially at [facility] and was extubated after which he was put on BiPAP.  Hospital course was complicated by metabolic encephalopathy requiring Precedex drip including Ativan and fentanyl.  Patient was subsequently transferred from any pain to [facility] ICU since her family wished aggressive care.  Hospital Course:   Following conditions were addressed during hospitalization as listed below,  Acute on chronic hypoxic and hypercarbic respiratory failure secondary to COPD exacerbation Improved clinically.  Initially was on mechanical ventilation followed by BiPAP.  At this time patient has been assessed for trilogy on discharge.  Will  be continued on oxygen supplementation discharge as well.  Patient has completed antibiotic course.    Pulmonary edema/Acute on chronic diastolic CHF Received IV Lasix with improvement.  Currently evolving.  Acute metabolic encephalopathy This was likely multifactorial, hypoxia, ICU delirium, steroids, withdrawal from benzos.  Improved during hospitalization.  Initially required Precedex drip.  Subsequently was restarted on valproic acid and Prozac from home.  Hypokalemia Replenished.  Anemia of critical illness and chronic disease Remained stable.  Bipolar disorder/Depression -Restarted on valproic acid and Prozac.   11/04/2020  f/u ov/[place] office/Bhargav Barbaro re:  COPD with GOLD III    spirometry / 02 dep @ 24/7 - says off cigs x one year Chief Complaint  Patient presents with  . Follow-up    Shortness of breath activity  Dyspnea: 50 - 100 ft  Cough: none  Sleeping: flat / 3 pillows  SABA use: none  02:  4llpm 24/7 / cpap per PCP  rec Plan A = Automatic = Always=    Trelegy one click each am  Plan B = Backup (to supplement plan A, not to replace it) Only use your albuterol inhaler as a rescue medication  Plan C = Crisis (instead of Plan B but only if Plan B stops working) - only use your albuterol nebulizer if you first try Plan B   01/03/21  ER eval for aecopd > medrol dose pack   01/07/2021  f/u ov/Kaelon Weekes re: GOLD III copd downhill x one year/ smoking again  Chief Complaint  Patient presents with  . Acute Visit    Increased SOB, wheezing and cough x 2 wks- worse over the past wk. He is winded with just walking.   Dyspnea:  Ok at rest but sob with any other activity = MMRC4  = sob if tries to leave home or while getting dressed   Cough: more congested > producing grey x 2 weeks  Sleeping:couch/  wedge pillow  And cpap "wears it some"  SABA use: albuterol 2 puffs 1.5 h prior to ov 02: 4lpm 24/7 Covid status:  vax x 3  rec Depomedrol 120 mg IM  Stop trelegy and  propranolol  Plan A = Automatic = Always=    Performist and yupelri  And 12 hours later just the perfomist Plan B = Backup (to supplement plan A, not to replace it) Only use your albuterol inhaler(RED/proair) as a rescue medication Plan C = Crisis (instead  of Plan B but only if Plan B stops working) - only use your albuterol nebulizer if you first try Plan B  If getting worse return to the hospital and no smoking at all.  Late add: doxy 100 mg bid x 7 days  See me in one week in [place] and go to ER if worse at all    01/17/2021  f/u ov/[place] office/Daronte Shostak re: GOLD II/III but 02 dep  Plu  Severe MS/ LAE rx by cards 01/16/21 rec increase lasix / off cigs/ on patch  Chief Complaint  Patient presents with  . Follow-up    No complaints currently  Dyspnea:  Walking up 9 steps (one flight) on 02/ all around house where now living with daughter and not smoking overall much better  Cough: none  Sleeping: on recliner at 30 degrees  SABA use: none at all  02: 4lpm  Covid status: vax x 3  rec Plan A = Automatic = Always=    Breztri Take 2 puffs first thing in am and then another 2 puffs about 12 hours later.  (in place yupelri /performist) Work on inhaler technique:   Plan B = Backup (to supplement plan A, not to replace it) Only use your albuterol inhaler as a rescue medication to be used if you can't catch your breath Plan C = Crisis (instead of Plan B but only if Plan B stops working) - only use your albuterol nebulizer if you first try Plan B and it fails to help > ok to use the nebulizer up to every 4 hours but if start needing it regularly call for immediate appointment      02/14/2021  f/u ov/[place] office/Deshawnda Acrey re:  GOLD II/III 02 dep/  mod/severe MS/LAE  Chief Complaint  Patient presents with  . Follow-up    Nasal congestion since last monday   Dyspnea:  Living in  own house now  / does not walk to MB any more / very evasive with questions re activity - says walks 5-6 loops  around the house s stopping on 4lpm POC but doe not titrate  Cough: none  Sleeping: on a couch on cpap  SABA use: none now  02: 4lpm 24/7  Covid status: vax x 3     No obvious day to day or daytime variability or assoc excess/ purulent sputum or mucus plugs or hemoptysis or cp or chest tightness, subjective wheeze or overt sinus or hb symptoms.   Sleeping as above  without nocturnal  or early am exacerbation  of respiratory  c/o's or need for noct saba. Also denies any obvious fluctuation of symptoms with weather or environmental changes or other aggravating or alleviating factors except as outlined above   No unusual exposure hx or h/o childhood pna/ asthma or knowledge of premature birth.  Current Allergies, Complete Past Medical History, Past Surgical History, Family History, and Social History were reviewed in [employer] record.  ROS  The following are not active complaints unless bolded Hoarseness, sore throat, dysphagia, dental problems, itching, sneezing,  nasal congestion or discharge of excess mucus or purulent secretions, ear ache,   fever, chills, sweats, unintended wt loss or wt gain, classically pleuritic or exertional cp,  orthopnea pnd or arm/hand swelling  or leg swelling, presyncope, palpitations, abdominal pain, anorexia, nausea, vomiting, diarrhea  or change in bowel habits or change in bladder habits, change in stools or change in urine, dysuria, hematuria,  rash, arthralgias, visual complaints, headache, numbness, weakness  or ataxia or problems with walking or coordination,  change in mood or  memory.        Current Meds  Medication Sig  . acetaminophen (TYLENOL) 325 MG tablet Take 2 tablets (650 mg total) by mouth every 6 (six) hours as needed for mild pain, fever or headache (or Fever >/= 101).  Marland Kitchen albuterol (PROVENTIL) (2.5 MG/3ML) 0.083% nebulizer solution Take 3 mLs (2.5 mg total) by nebulization every 4 (four) hours as needed for wheezing or  shortness of breath.  Marland Kitchen albuterol (VENTOLIN HFA) 108 (90 Base) MCG/ACT inhaler Inhale 2 puffs into the lungs every 4 (four) hours as needed for wheezing or shortness of breath.  Marland Kitchen aspirin EC 81 MG tablet Take 1 tablet (81 mg total) by mouth daily with breakfast.  . atorvastatin (LIPITOR) 20 MG tablet Take 1 tablet (20 mg total) by mouth daily. For high cholesterol (Patient taking differently: Take 20 mg by mouth every evening. For high cholesterol)  . bisoprolol (ZEBETA) 5 MG tablet Take 1 tablet (5 mg total) by mouth daily.  . Budeson-Glycopyrrol-Formoterol (BREZTRI AEROSPHERE) 160-9-4.8 MCG/ACT AERO Take 2 puffs first thing in am and then another 2 puffs about 12 hours later.  . diltiazem (CARDIZEM CD) 180 MG 24 hr capsule Take 180 mg by mouth daily.  . divalproex (DEPAKOTE ER) 500 MG 24 hr tablet Take 3 tablets (1,500 mg total) by mouth at bedtime.  Marland Kitchen FLUoxetine (PROZAC) 20 MG capsule Take 1 capsule (20 mg total) by mouth daily.  . folic acid (FOLVITE) 1 MG tablet Take 1 mg by mouth every morning.   . furosemide (LASIX) 40 MG tablet TAKE 40 MG DAILY MAY TAKE ADDITIONAL 20 MG AS NEEDED (Patient taking differently: TAKE 40 MG DAILY MAY TAKE ADDITIONAL 20 MG (takes 1.5 tabs))  . gabapentin (NEURONTIN) 400 MG capsule Take 400 mg by mouth 3 (three) times daily.  Marland Kitchen guaiFENesin (MUCINEX) 600 MG 12 hr tablet Take 1 tablet (600 mg total) by mouth 2 (two) times daily as needed for cough or to loosen phlegm.  . Multiple Vitamin (MULTIVITAMIN) tablet Take 1 tablet by mouth daily.  Marland Kitchen OLANZapine (ZYPREXA) 10 MG tablet Take 1 tablet (10 mg total) by mouth 2 (two) times daily.  Marland Kitchen oxymetazoline (AFRIN) 0.05 % nasal spray Place 1 spray into both nostrils daily.  . pantoprazole (PROTONIX) 20 MG tablet Take 20 mg by mouth daily.  . potassium chloride SA (KLOR-CON) 20 MEQ tablet Take 20 mEq by mouth daily.  Marland Kitchen thiamine 100 MG tablet Take 100 mg by mouth daily.  . traZODone (DESYREL) 50 MG tablet Take 1 tablet (50 mg  total) by mouth at bedtime.  . vitamin B-12 (CYANOCOBALAMIN) 500 MCG tablet Take 1,000 mcg by mouth every morning.                            Past Medical History:  Diagnosis Date  . Acute blood loss anemia 02/07/2017  . Anxiety   . Arthritis    deg disease, bulging disk,  shoulder level  . Bipolar disorder (HCC)   . Bipolar disorder (HCC)   . CHF (congestive heart failure) (HCC)   . 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 Depression    anxiety  . Hyperlipidemia   . Hypertension   . Peptic ulcer disease    Review  . Pneumonia   . [name] (peripheral vascular disease) (HCC) 06/18/2015  . Shortness of breath  Objective:    02/14/2021       185  01/17/2021       189 01/07/2021       175 11/04/2020     178   04/26/2020        181   03/29/20 170 lb (77.1 kg)  01/16/20 143 lb 4.8 oz (65 kg)  01/10/20 143 lb 11.8 oz (65.2 kg)     Vital signs reviewed  02/14/2021  - Note at rest 02 sats  97% on 4lpm POC  And 95% on 2lpm POC   General appearance:   Extremely hoarse amb wm nad    HEENT : pt wearing mask not removed for exam due to covid -19 concerns.    NECK :  without JVD/Nodes/TM/ nl carotid upstrokes bilaterally   LUNGS: no acc muscle use,  Mod barrel  contour chest wall with bilateral  Distant bs s audible wheeze and  without cough on insp or exp maneuvers and mod  Hyperresonant  to  percussion bilaterally     CV:  RRR distant S1S2  no s3 or murmur or increase in P2, and no edema   ABD:  soft and nontender with pos mid insp Hoover's  in the supine position. No bruits or organomegaly appreciated, bowel sounds nl  MS:     ext warm without deformities, calf tenderness, cyanosis or clubbing No obvious joint restrictions   SKIN: warm and dry without lesions    NEURO:  alert, approp, nl sensorium with  no motor or cerebellar deficits apparent.                Assessment

## 2021-03-24 ENCOUNTER — Encounter (HOSPITAL_COMMUNITY): Payer: Self-pay | Admitting: Emergency Medicine

## 2021-03-24 ENCOUNTER — Emergency Department (HOSPITAL_COMMUNITY): Payer: Medicare Other

## 2021-03-24 ENCOUNTER — Other Ambulatory Visit: Payer: Self-pay

## 2021-03-24 ENCOUNTER — Emergency Department (HOSPITAL_COMMUNITY)
Admission: EM | Admit: 2021-03-24 | Discharge: 2021-03-24 | Disposition: A | Discharge status: Home / Self Care | Payer: Medicare Other | Attending: Emergency Medicine | Admitting: Emergency Medicine

## 2021-03-24 DIAGNOSIS — Z79899 Other long term (current) drug therapy: Secondary | ICD-10-CM | POA: Diagnosis not present

## 2021-03-24 DIAGNOSIS — Z20822 Contact with and (suspected) exposure to covid-19: Secondary | ICD-10-CM | POA: Insufficient documentation

## 2021-03-24 DIAGNOSIS — Z87891 Personal history of nicotine dependence: Secondary | ICD-10-CM | POA: Insufficient documentation

## 2021-03-24 DIAGNOSIS — I110 Hypertensive heart disease with heart failure: Secondary | ICD-10-CM | POA: Diagnosis not present

## 2021-03-24 DIAGNOSIS — J441 Chronic obstructive pulmonary disease with (acute) exacerbation: Secondary | ICD-10-CM | POA: Insufficient documentation

## 2021-03-24 DIAGNOSIS — Z7952 Long term (current) use of systemic steroids: Secondary | ICD-10-CM | POA: Insufficient documentation

## 2021-03-24 DIAGNOSIS — R0602 Shortness of breath: Secondary | ICD-10-CM | POA: Diagnosis present

## 2021-03-24 DIAGNOSIS — Z7982 Long term (current) use of aspirin: Secondary | ICD-10-CM | POA: Insufficient documentation

## 2021-03-24 DIAGNOSIS — E876 Hypokalemia: Secondary | ICD-10-CM | POA: Insufficient documentation

## 2021-03-24 DIAGNOSIS — I5033 Acute on chronic diastolic (congestive) heart failure: Secondary | ICD-10-CM | POA: Insufficient documentation

## 2021-03-24 LAB — CBC
HCT: 37.3 % — ABNORMAL LOW (ref 39.0–52.0)
Hemoglobin: 11.9 g/dL — ABNORMAL LOW (ref 13.0–17.0)
MCH: 29.2 pg (ref 26.0–34.0)
MCHC: 31.9 g/dL (ref 30.0–36.0)
MCV: 91.4 fL (ref 80.0–100.0)
Platelets: 275 10*3/uL (ref 150–400)
RBC: 4.08 MIL/uL — ABNORMAL LOW (ref 4.22–5.81)
RDW: 11.9 % (ref 11.5–15.5)
WBC: 6.6 10*3/uL (ref 4.0–10.5)
nRBC: 0 % (ref 0.0–0.2)

## 2021-03-24 LAB — RESP PANEL BY RT-PCR (FLU A&B, COVID) ARPGX2
Influenza A by PCR: NEGATIVE
Influenza B by PCR: NEGATIVE
SARS Coronavirus 2 by RT PCR: NEGATIVE

## 2021-03-24 LAB — D-DIMER, QUANTITATIVE: D-Dimer, Quant: 0.99 ug/mL-FEU — ABNORMAL HIGH (ref 0.00–0.50)

## 2021-03-24 LAB — BASIC METABOLIC PANEL
Anion gap: 9 (ref 5–15)
BUN: 12 mg/dL (ref 6–20)
CO2: 45 mmol/L — ABNORMAL HIGH (ref 22–32)
Calcium: 9.4 mg/dL (ref 8.9–10.3)
Chloride: 81 mmol/L — ABNORMAL LOW (ref 98–111)
Creatinine, Ser: 0.75 mg/dL (ref 0.61–1.24)
GFR, Estimated: >60 mL/min (ref >60)
Glucose, Bld: 79 mg/dL (ref 70–99)
Potassium: 3.1 mmol/L — ABNORMAL LOW (ref 3.5–5.1)
Sodium: 135 mmol/L (ref 135–145)

## 2021-03-24 LAB — TROPONIN I (HIGH SENSITIVITY)
Troponin I (High Sensitivity): 3 ng/L (ref <18)
Troponin I (High Sensitivity): 3 ng/L (ref <18)

## 2021-03-24 LAB — BRAIN NATRIURETIC PEPTIDE: B Natriuretic Peptide: 82 pg/mL (ref 0.0–100.0)

## 2021-03-24 MED ORDER — POTASSIUM CHLORIDE CRYS ER 20 MEQ PO TBCR: 20.0000 meq | EXTENDED_RELEASE_TABLET | Freq: Two times a day (BID) | ORAL | 0 refills | Status: AC

## 2021-03-24 MED ORDER — DOXYCYCLINE HYCLATE 100 MG PO CAPS: 100.0000 mg | ORAL_CAPSULE | Freq: Two times a day (BID) | ORAL | 0 refills | Status: AC

## 2021-03-24 MED ORDER — METHYLPREDNISOLONE SODIUM SUCC 125 MG IJ SOLR
125.0000 mg | Freq: Once | INTRAMUSCULAR | Status: AC
  Administered 2021-03-24: 125 mg via INTRAVENOUS
  Filled 2021-03-24: qty 2

## 2021-03-24 MED ORDER — IOHEXOL 350 MG/ML SOLN
100.0000 mL | Freq: Once | INTRAVENOUS | Status: AC | PRN
  Administered 2021-03-24: 100 mL via INTRAVENOUS

## 2021-03-24 MED ORDER — POTASSIUM CHLORIDE CRYS ER 20 MEQ PO TBCR
40.0000 meq | EXTENDED_RELEASE_TABLET | Freq: Once | ORAL | Status: AC
  Administered 2021-03-24: 40 meq via ORAL
  Filled 2021-03-24: qty 2

## 2021-03-24 MED ORDER — ALBUTEROL SULFATE HFA 108 (90 BASE) MCG/ACT IN AERS
4.0000 | INHALATION_SPRAY | Freq: Once | RESPIRATORY_TRACT | Status: AC
  Administered 2021-03-24: 4 via RESPIRATORY_TRACT
  Filled 2021-03-24: qty 6.7

## 2021-03-24 NOTE — ED Notes (Signed)
Pt's father will find his oxygen tank and come pick up pt.

## 2021-03-24 NOTE — ED Notes (Signed)
Pt found sitting on edge of bed, had started to pull some monitor cords off. Pt assisted back in stretcher. Oxygen reapplied. Pt reminded to stay in bed and not remove the monitor cords. IV remains intact.

## 2021-03-24 NOTE — ED Notes (Signed)
Daughter at bedside speaking with PA.

## 2021-03-24 NOTE — ED Notes (Signed)
Pt remains confused, intermittently pulls off monitor cords and staff have to replace them. Pulls off . Staff have educated him multiple times why he is wearing oxygen and the need for monitors. Pt situated on stretcher in position of comfort at this time. Staff continue to monitor. Pt has not needed to use urinal at this time.

## 2021-03-24 NOTE — Discharge Instructions (Addendum)
As discussed, please wear your oxygen continuously at 4 L.  Take the antibiotic as directed until its finished.  Your potassium level today was slightly low.  This likely comes from taking your diuretic medication.  I have prescribed a refill for your potassium.  This will need to be rechecked later this week.  Your primary care provider can recheck this for you.  Also, the CT of your chest shows a possible infection.  You will need to have a repeat CT of your chest in 3 months to ensure the infection has improved.  Return to emergency department for any worsening symptoms.
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
## 2021-03-24 NOTE — ED Notes (Signed)
ETA 20 minutes for daughter

## 2021-03-24 NOTE — ED Triage Notes (Signed)
Pt;s daughter states pt has been disoriented x 2 days. Reports pt has not been wearing his bipap at night for 3 days. 4 L  of oxygen worn chronically.

## 2021-03-24 NOTE — ED Notes (Signed)
Patient transported to X-ray 

## 2021-03-24 NOTE — ED Notes (Signed)
Pt disoriented, unable to understand explanation for signature

## 2021-03-24 NOTE — ED Notes (Signed)
Called pt's father back, states pt's daughter is on her way to pick him up with his oxygen tank. He is unsure of ETA, unable to reach daughter at provided number.

## 2021-03-24 NOTE — ED Notes (Signed)
Pt appears more alert at this time. Denies dyspnea currently. Denies pain. Connected to monitors. Updated on POC. VS recycled. IV remains intact.

## 2021-03-24 NOTE — ED Notes (Signed)
This nurse rounded on pt, found he had removed all monitor wires and pulled out IV. IV catheter was intact. No bleeding from arm. Monitors replaced. Updated on POC. Pt appears confused, partially understands why he is here. Curtain left open for safety.

## 2021-03-24 NOTE — ED Notes (Signed)
Sgnature pad not available for electronic signature. Daughter and pt both verbalized understand of discharge instructions.

## 2021-04-10 ENCOUNTER — Telehealth: Payer: Self-pay | Admitting: Internal Medicine

## 2021-04-10 NOTE — Telephone Encounter (Signed)
Called and spoke to pt's wife. She states the pt isnt able to fill his Breztri with the pharmacy at this time but is unsure why. Called [facility] and spoke to a tech and was advised there is no issue with pt getting his Breztri filled. He has a $47 copay which is what pt has been paying. Called pt's wife back and informed her of this. She verbalized understanding and is aware to contact us back if there are any other issues. Nothing further needed at this time.

## 2021-04-16 ENCOUNTER — Emergency Department (HOSPITAL_COMMUNITY): Payer: Medicare Other

## 2021-04-16 ENCOUNTER — Inpatient Hospital Stay (HOSPITAL_COMMUNITY)
Admission: EM | Admit: 2021-04-16 | Discharge: 2021-05-23 | DRG: 208 | Disposition: E | Payer: Medicare Other | Attending: Internal Medicine | Admitting: Internal Medicine

## 2021-04-16 ENCOUNTER — Other Ambulatory Visit: Payer: Self-pay

## 2021-04-16 ENCOUNTER — Encounter (HOSPITAL_COMMUNITY): Payer: Self-pay | Admitting: *Deleted

## 2021-04-16 DIAGNOSIS — E871 Hypo-osmolality and hyponatremia: Secondary | ICD-10-CM | POA: Diagnosis present

## 2021-04-16 DIAGNOSIS — U071 COVID-19: Secondary | ICD-10-CM | POA: Diagnosis present

## 2021-04-16 DIAGNOSIS — J1282 Pneumonia due to coronavirus disease 2019: Secondary | ICD-10-CM | POA: Diagnosis present

## 2021-04-16 DIAGNOSIS — R911 Solitary pulmonary nodule: Secondary | ICD-10-CM | POA: Diagnosis present

## 2021-04-16 DIAGNOSIS — J9601 Acute respiratory failure with hypoxia: Secondary | ICD-10-CM

## 2021-04-16 DIAGNOSIS — E785 Hyperlipidemia, unspecified: Secondary | ICD-10-CM | POA: Diagnosis present

## 2021-04-16 DIAGNOSIS — J9621 Acute and chronic respiratory failure with hypoxia: Secondary | ICD-10-CM | POA: Diagnosis present

## 2021-04-16 DIAGNOSIS — R278 Other lack of coordination: Secondary | ICD-10-CM | POA: Diagnosis present

## 2021-04-16 DIAGNOSIS — I5032 Chronic diastolic (congestive) heart failure: Secondary | ICD-10-CM | POA: Diagnosis present

## 2021-04-16 DIAGNOSIS — Z7952 Long term (current) use of systemic steroids: Secondary | ICD-10-CM

## 2021-04-16 DIAGNOSIS — J9602 Acute respiratory failure with hypercapnia: Secondary | ICD-10-CM

## 2021-04-16 DIAGNOSIS — Z66 Do not resuscitate: Secondary | ICD-10-CM | POA: Diagnosis not present

## 2021-04-16 DIAGNOSIS — J439 Emphysema, unspecified: Secondary | ICD-10-CM | POA: Diagnosis present

## 2021-04-16 DIAGNOSIS — Z803 Family history of malignant neoplasm of breast: Secondary | ICD-10-CM

## 2021-04-16 DIAGNOSIS — E876 Hypokalemia: Secondary | ICD-10-CM | POA: Diagnosis present

## 2021-04-16 DIAGNOSIS — J441 Chronic obstructive pulmonary disease with (acute) exacerbation: Secondary | ICD-10-CM | POA: Diagnosis not present

## 2021-04-16 DIAGNOSIS — I739 Peripheral vascular disease, unspecified: Secondary | ICD-10-CM | POA: Diagnosis present

## 2021-04-16 DIAGNOSIS — R0689 Other abnormalities of breathing: Secondary | ICD-10-CM | POA: Diagnosis not present

## 2021-04-16 DIAGNOSIS — F17210 Nicotine dependence, cigarettes, uncomplicated: Secondary | ICD-10-CM | POA: Diagnosis present

## 2021-04-16 DIAGNOSIS — Z9152 Personal history of nonsuicidal self-harm: Secondary | ICD-10-CM

## 2021-04-16 DIAGNOSIS — J9622 Acute and chronic respiratory failure with hypercapnia: Secondary | ICD-10-CM | POA: Diagnosis present

## 2021-04-16 DIAGNOSIS — I050 Rheumatic mitral stenosis: Secondary | ICD-10-CM | POA: Diagnosis present

## 2021-04-16 DIAGNOSIS — F419 Anxiety disorder, unspecified: Secondary | ICD-10-CM | POA: Diagnosis present

## 2021-04-16 DIAGNOSIS — F319 Bipolar disorder, unspecified: Secondary | ICD-10-CM | POA: Diagnosis present

## 2021-04-16 DIAGNOSIS — F10231 Alcohol dependence with withdrawal delirium: Secondary | ICD-10-CM | POA: Diagnosis present

## 2021-04-16 DIAGNOSIS — Z8711 Personal history of peptic ulcer disease: Secondary | ICD-10-CM

## 2021-04-16 DIAGNOSIS — R231 Pallor: Secondary | ICD-10-CM | POA: Diagnosis present

## 2021-04-16 DIAGNOSIS — Z79899 Other long term (current) drug therapy: Secondary | ICD-10-CM

## 2021-04-16 DIAGNOSIS — I10 Essential (primary) hypertension: Secondary | ICD-10-CM | POA: Diagnosis present

## 2021-04-16 DIAGNOSIS — M1990 Unspecified osteoarthritis, unspecified site: Secondary | ICD-10-CM | POA: Diagnosis present

## 2021-04-16 DIAGNOSIS — K219 Gastro-esophageal reflux disease without esophagitis: Secondary | ICD-10-CM | POA: Diagnosis present

## 2021-04-16 DIAGNOSIS — Z515 Encounter for palliative care: Secondary | ICD-10-CM

## 2021-04-16 DIAGNOSIS — Z7982 Long term (current) use of aspirin: Secondary | ICD-10-CM

## 2021-04-16 DIAGNOSIS — Z781 Physical restraint status: Secondary | ICD-10-CM

## 2021-04-16 DIAGNOSIS — E874 Mixed disorder of acid-base balance: Secondary | ICD-10-CM | POA: Diagnosis present

## 2021-04-16 DIAGNOSIS — Z8249 Family history of ischemic heart disease and other diseases of the circulatory system: Secondary | ICD-10-CM

## 2021-04-16 DIAGNOSIS — R0602 Shortness of breath: Secondary | ICD-10-CM | POA: Diagnosis present

## 2021-04-16 DIAGNOSIS — Z825 Family history of asthma and other chronic lower respiratory diseases: Secondary | ICD-10-CM

## 2021-04-16 DIAGNOSIS — Z9119 Patient's noncompliance with other medical treatment and regimen: Secondary | ICD-10-CM

## 2021-04-16 DIAGNOSIS — Z7189 Other specified counseling: Secondary | ICD-10-CM | POA: Diagnosis not present

## 2021-04-16 DIAGNOSIS — I5030 Unspecified diastolic (congestive) heart failure: Secondary | ICD-10-CM | POA: Diagnosis present

## 2021-04-16 DIAGNOSIS — G9341 Metabolic encephalopathy: Secondary | ICD-10-CM | POA: Diagnosis present

## 2021-04-16 DIAGNOSIS — Z880 Allergy status to penicillin: Secondary | ICD-10-CM

## 2021-04-16 DIAGNOSIS — I110 Hypertensive heart disease with heart failure: Secondary | ICD-10-CM | POA: Diagnosis present

## 2021-04-16 DIAGNOSIS — Z789 Other specified health status: Secondary | ICD-10-CM

## 2021-04-16 DIAGNOSIS — Z818 Family history of other mental and behavioral disorders: Secondary | ICD-10-CM

## 2021-04-16 DIAGNOSIS — I5033 Acute on chronic diastolic (congestive) heart failure: Secondary | ICD-10-CM | POA: Diagnosis not present

## 2021-04-16 DIAGNOSIS — Z888 Allergy status to other drugs, medicaments and biological substances status: Secondary | ICD-10-CM

## 2021-04-16 LAB — BASIC METABOLIC PANEL
Anion gap: 12 (ref 5–15)
BUN: 12 mg/dL (ref 6–20)
CO2: 47 mmol/L — ABNORMAL HIGH (ref 22–32)
Calcium: 9.1 mg/dL (ref 8.9–10.3)
Chloride: 78 mmol/L — ABNORMAL LOW (ref 98–111)
Creatinine, Ser: 0.55 mg/dL — ABNORMAL LOW (ref 0.61–1.24)
GFR, Estimated: >60 mL/min (ref >60)
Glucose, Bld: 130 mg/dL — ABNORMAL HIGH (ref 70–99)
Potassium: 4.1 mmol/L (ref 3.5–5.1)
Sodium: 137 mmol/L (ref 135–145)

## 2021-04-16 LAB — BLOOD GAS, ARTERIAL
Acid-Base Excess: 24 mmol/L — ABNORMAL HIGH (ref 0.0–2.0)
Acid-Base Excess: 22.3 mmol/L — ABNORMAL HIGH (ref 0.0–2.0)
Bicarbonate: 46.5 mmol/L — ABNORMAL HIGH (ref 20.0–28.0)
Bicarbonate: 43.4 mmol/L — ABNORMAL HIGH (ref 20.0–28.0)
FIO2: 40
FIO2: 32
O2 Saturation: 99.3 %
O2 Saturation: 82.5 %
Patient temperature: 37
Patient temperature: 37
pCO2 arterial: 70.9 mmHg (ref 32.0–48.0)
pCO2 arterial: 82.3 mmHg (ref 32.0–48.0)
pH, Arterial: 7.462 — ABNORMAL HIGH (ref 7.350–7.450)
pH, Arterial: 7.39 (ref 7.350–7.450)
pO2, Arterial: 120 mmHg — ABNORMAL HIGH (ref 83.0–108.0)
pO2, Arterial: 46.8 mmHg — ABNORMAL LOW (ref 83.0–108.0)

## 2021-04-16 LAB — BLOOD GAS, VENOUS
Acid-Base Excess: 26.2 mmol/L — ABNORMAL HIGH (ref 0.0–2.0)
Bicarbonate: 45.1 mmol/L — ABNORMAL HIGH (ref 20.0–28.0)
FIO2: 36
O2 Saturation: 57.9 %
Patient temperature: 36.6
pCO2, Ven: 112 mmHg (ref 44.0–60.0)
pH, Ven: 7.3 (ref 7.250–7.430)
pO2, Ven: 33.9 mmHg (ref 32.0–45.0)

## 2021-04-16 LAB — CBC
HCT: 41.1 % (ref 39.0–52.0)
Hemoglobin: 13.3 g/dL (ref 13.0–17.0)
MCH: 30 pg (ref 26.0–34.0)
MCHC: 32.4 g/dL (ref 30.0–36.0)
MCV: 92.8 fL (ref 80.0–100.0)
Platelets: 258 K/uL (ref 150–400)
RBC: 4.43 MIL/uL (ref 4.22–5.81)
RDW: 11.8 % (ref 11.5–15.5)
WBC: 9.3 K/uL (ref 4.0–10.5)
nRBC: 0 % (ref 0.0–0.2)

## 2021-04-16 LAB — RESP PANEL BY RT-PCR (FLU A&B, COVID) ARPGX2
Influenza A by PCR: NEGATIVE
Influenza B by PCR: NEGATIVE
SARS Coronavirus 2 by RT PCR: POSITIVE — AB

## 2021-04-16 LAB — BRAIN NATRIURETIC PEPTIDE: B Natriuretic Peptide: 76 pg/mL (ref 0.0–100.0)

## 2021-04-16 MED ORDER — LORAZEPAM 2 MG/ML IJ SOLN
INTRAMUSCULAR | Status: AC
  Filled 2021-04-16: qty 1

## 2021-04-16 MED ORDER — ALBUTEROL SULFATE HFA 108 (90 BASE) MCG/ACT IN AERS: 2.0000 | INHALATION_SPRAY | RESPIRATORY_TRACT | Status: DC | PRN

## 2021-04-16 MED ORDER — ALBUTEROL SULFATE HFA 108 (90 BASE) MCG/ACT IN AERS
4.0000 | INHALATION_SPRAY | Freq: Once | RESPIRATORY_TRACT | Status: AC
  Administered 2021-04-16: 4 via RESPIRATORY_TRACT
  Filled 2021-04-16: qty 6.7

## 2021-04-16 MED ORDER — PANTOPRAZOLE SODIUM 40 MG PO TBEC: 40.0000 mg | DELAYED_RELEASE_TABLET | Freq: Every day | ORAL | Status: DC

## 2021-04-16 MED ORDER — SODIUM CHLORIDE 0.9 % IV SOLN
100.0000 mg | Freq: Two times a day (BID) | INTRAVENOUS | Status: AC
  Administered 2021-04-17 – 2021-04-23 (×14): 100 mg via INTRAVENOUS
  Filled 2021-04-16 (×16): qty 100

## 2021-04-16 MED ORDER — SODIUM CHLORIDE 0.9 % IV SOLN
100.0000 mg | INTRAVENOUS | Status: AC
  Administered 2021-04-16 (×2): 100 mg via INTRAVENOUS
  Filled 2021-04-16: qty 20

## 2021-04-16 MED ORDER — METHYLPREDNISOLONE SODIUM SUCC 125 MG IJ SOLR
60.0000 mg | Freq: Four times a day (QID) | INTRAMUSCULAR | Status: DC
  Administered 2021-04-17: 60 mg via INTRAVENOUS
  Filled 2021-04-16 (×2): qty 2

## 2021-04-16 MED ORDER — DEXAMETHASONE SODIUM PHOSPHATE 10 MG/ML IJ SOLN
6.0000 mg | Freq: Once | INTRAMUSCULAR | Status: AC
  Administered 2021-04-16: 6 mg via INTRAVENOUS
  Filled 2021-04-16: qty 1

## 2021-04-16 MED ORDER — SODIUM CHLORIDE 0.9 % IV SOLN
100.0000 mg | Freq: Every day | INTRAVENOUS | Status: DC
  Administered 2021-04-17 – 2021-04-19 (×3): 100 mg via INTRAVENOUS
  Filled 2021-04-16: qty 100
  Filled 2021-04-16: qty 20
  Filled 2021-04-16 (×2): qty 100

## 2021-04-16 MED ORDER — METHYLPREDNISOLONE SODIUM SUCC 125 MG IJ SOLR
125.0000 mg | Freq: Once | INTRAMUSCULAR | Status: AC
  Administered 2021-04-16: 125 mg via INTRAVENOUS
  Filled 2021-04-16: qty 2

## 2021-04-16 MED ORDER — LORAZEPAM 2 MG/ML IJ SOLN
0.5000 mg | Freq: Once | INTRAMUSCULAR | Status: AC
  Administered 2021-04-16: 0.5 mg via INTRAVENOUS
  Filled 2021-04-16: qty 1

## 2021-04-16 MED ORDER — HALOPERIDOL LACTATE 5 MG/ML IJ SOLN
5.0000 mg | Freq: Four times a day (QID) | INTRAMUSCULAR | Status: DC | PRN
  Administered 2021-04-16 – 2021-04-21 (×4): 5 mg via INTRAVENOUS
  Filled 2021-04-16 (×5): qty 1

## 2021-04-16 MED ORDER — IPRATROPIUM-ALBUTEROL 20-100 MCG/ACT IN AERS
1.0000 | INHALATION_SPRAY | Freq: Four times a day (QID) | RESPIRATORY_TRACT | Status: DC
  Administered 2021-04-17: 1 via RESPIRATORY_TRACT
  Filled 2021-04-16: qty 4

## 2021-04-16 MED ORDER — FUROSEMIDE 40 MG PO TABS
40.0000 mg | ORAL_TABLET | Freq: Every day | ORAL | Status: DC
  Administered 2021-04-17: 40 mg via ORAL
  Filled 2021-04-16: qty 1
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
## 2021-04-16 NOTE — ED Notes (Signed)
Date and time results received: 2021/05/11 2133 (use smartphrase ".now" to insert current time)  Test: covid Critical Value: positive  Name of Provider Notified: Dr Lynelle Doctor  Orders Received? Or Actions Taken?: Actions Taken: no orders received

## 2021-04-16 NOTE — Progress Notes (Signed)
ABG obtained at 2317 was a mixed venous sample.

## 2021-04-16 NOTE — ED Notes (Signed)
Date and time results received: 04/06/2021 1917   Test: PCO2 Critical Value: 112  Name of Provider Notified: Lynelle Doctor  Orders Received? Or Actions Taken?: NA

## 2021-04-16 NOTE — ED Notes (Signed)
Date and time results received: 04/01/2021 11:31 PM(use smartphrase ".now" to insert current time)  Test: CO2 Critical Value: 82.3  Name of Provider Notified: Dr Herma Carson  Orders Received? Or Actions Taken?: see chart

## 2021-04-16 NOTE — ED Provider Notes (Signed)
[facility] EMERGENCY DEPARTMENT Provider Note   CSN: [id number] Arrival date & time: 03/30/2021  1742     History Chief Complaint  Patient presents with  . Shortness of Breath    Jeffrey [name] is a 60 y.o. male.  HPI   Patient presented to the ED for evaluation of shortness of breath.  Patient states he has history of COPD and is normally on oxygen 4 L nasal cannula.  Patient states he started feeling more short of breath today.  He had to call EMS because the medications were not helping.  He was given albuterol as well as Atrovent.  He was also given Solu-Medrol.  Patient noted some improvement after the EMS treatment.  He denies any fevers or chills.  No chest pain.  No leg swelling.  No known COVID exposures.  Past Medical History:  Diagnosis Date  . Acute blood loss anemia 02/07/2017  . Anxiety   . Arthritis    deg disease, bulging disk,  shoulder level  . Bipolar disorder (HCC)   . Bipolar disorder (HCC)   . CHF (congestive heart failure) (HCC)   . 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 Depression    anxiety  . Hyperlipidemia   . Hypertension   . Peptic ulcer disease    Review  . Pneumonia   . [name] (peripheral vascular disease) (HCC) 06/18/2015  . Shortness of breath     Patient Active Problem List   Diagnosis Date Noted  . Pressure injury of skin 10/05/2020  . DNR (do not resuscitate) discussion   . Acute respiratory failure with hypercapnia (HCC) 09/29/2020  . Lactic acidosis 09/29/2020  . Hyponatremia 09/29/2020  . Hyperglycemia 09/29/2020  . Elevated brain natriuretic peptide (BNP) level 09/29/2020  . (HFpEF) heart failure with preserved ejection fraction (HCC) 09/29/2020  . Rhinitis, chronic 08/01/2020  . Chronic respiratory failure with hypoxia and hypercapnia (HCC) 03/30/2020  . Acute on chronic diastolic CHF (congestive heart failure) (HCC) 12/26/2019  . Aspiration pneumonitis (HCC) 12/26/2019  . Acute on chronic respiratory failure with hypoxia and  hypercapnia (HCC) 12/25/2019  . Lobar pneumonia (HCC) 12/25/2019  . Acute metabolic encephalopathy 12/25/2019  . Acute on chronic respiratory failure with hypercapnia (HCC) 12/24/2019  . COPD with acute exacerbation (HCC) 10/12/2019  . Respiratory failure with hypoxia (HCC) 04/30/2019  . Acute on chronic respiratory failure with hypoxia (HCC) 04/30/2019  . Bronchiectasis (HCC) 03/24/2019  . Fever 03/24/2019  . Cough   . Dysphagia   . Palliative care by specialist   . Goals of care, counseling/discussion   . Hepatic encephalopathy (HCC)   . COPD exacerbation (HCC) 09/16/2018  . Essential hypertension 09/16/2018  . GERD (gastroesophageal reflux disease) 09/16/2018  . Hypoxia   . Chronic diastolic CHF (congestive heart failure) (HCC)   . Bipolar disorder with severe depression (HCC) 02/01/2018  . Alcohol use disorder, severe, dependence (HCC) 02/01/2018  . Cannabis use disorder, severe, dependence (HCC) 02/01/2018  . MDD (major depressive disorder), recurrent episode, severe (HCC) 01/31/2018  . Internal and external bleeding hemorrhoids   . Rectal bleeding   . Iron deficiency anemia due to chronic blood loss 03/09/2017  . Anemia   . GIB (gastrointestinal bleeding) 02/07/2017  . Peptic ulcer disease   . Bipolar disorder (HCC)   . 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 H/O: depression 08/26/2016  . Dyslipidemia 08/26/2016  . Cigarette smoker 08/26/2016  . [name] (peripheral vascular disease) (HCC) 06/18/2015  . Chronic diarrhea 05/02/2013  .  Hematochezia 05/02/2013  . Abnormal weight loss 05/02/2013  . Abdominal pain, periumbilical 05/02/2013    Past Surgical History:  Procedure Laterality Date  . BIOPSY  02/09/2017   Procedure: BIOPSY;  Surgeon: [name], MD;  Location: [facility];  Service: Endoscopy;;  duodenal gastric  . COLONOSCOPY  03/14/2007   [id number] colonoscopy and terminal ileoscopy except external hemorrhoids  . COLONOSCOPY N/A 05/05/2013   [name]:  external/internal anal canal hemorrhoids, unable to intubate TI, segemental biopsies unremarkable   . COLONOSCOPY N/A 04/11/2017   Procedure: COLONOSCOPY;  Surgeon: [name], MD;  Location: [facility];  Service: Endoscopy;  Laterality: N/A;  . COLONOSCOPY WITH PROPOFOL N/A 03/31/2017   Procedure: COLONOSCOPY WITH PROPOFOL;  Surgeon: [name], MD;  Location: [facility];  Service: Endoscopy;  Laterality: N/A;  1:45pm  . ESOPHAGOGASTRODUODENOSCOPY  03/14/2007   [id number] antral gastritis with bulbar duodenitis/paucity to postbulbar duodenal folds and biopsy were benign with no evidence of villous atrophy.  . ESOPHAGOGASTRODUODENOSCOPY (EGD) WITH PROPOFOL N/A 02/09/2017   Procedure: ESOPHAGOGASTRODUODENOSCOPY (EGD) WITH PROPOFOL;  Surgeon: [name], MD;  Location: [facility];  Service: Endoscopy;  Laterality: N/A;  . GIVENS CAPSULE STUDY N/A 04/08/2017   Procedure: GIVENS CAPSULE STUDY;  Surgeon: [name], MD;  Location: [facility];  Service: Endoscopy;  Laterality: N/A;  . HAND SURGERY     left, secondary to self-inflicted laceration  . HEMORRHOID SURGERY N/A 04/14/2017   Procedure: HEMORRHOIDECTOMY;  Surgeon: [name], MD;  Location: [facility];  Service: General;  Laterality: N/A;  . SHOULDER SURGERY     right  . TOE SURGERY     left great toe , amputated-lawnmover accident       Family History  Problem Relation Age of Onset  . Breast cancer Mother        deceased  . Heart disease Father   . Depression Daughter   . Anxiety disorder Daughter   . Anxiety disorder Son   . Depression Son   . Asthma Brother   . Heart attack Maternal Aunt   . Heart attack Maternal Uncle   . Heart attack Paternal Aunt   . Heart attack Paternal Uncle   . Heart attack Maternal Grandmother   . Heart attack Maternal Grandfather   . Emphysema Maternal Grandfather   . Heart attack Paternal Grandmother   . Heart attack Paternal Grandfather   . Colon cancer Neg Hx   .  Liver disease Neg Hx     Social History   Tobacco Use  . Smoking status: Former Smoker    Packs/day: 1.00    Years: 40.00    Pack years: 40.00    Types: Cigarettes    Start date: 08/04/1971    Quit date: 01/09/2021    Years since quitting: 0.2  . Smokeless tobacco: Former Neurosurgeon    Quit date: 09/30/2019  . Tobacco comment: peak rate of 2.5ppd, 1/2ppd on 11/27/2016 -- 6 cigarettes / day 01/25/18  Vaping Use  . Vaping Use: Never used  Substance Use Topics  . Alcohol use: No    Alcohol/week: 0.0 standard drinks  . Drug use: Yes    Types: Marijuana    Comment: most days     Home Medications Prior to Admission medications   Medication Sig Start Date End Date Taking? Authorizing Provider  acetaminophen (TYLENOL) 325 MG tablet Take 2 tablets (650 mg total) by mouth every 6 (six) hours as needed for mild  pain, fever or headache (or Fever >/= 101). 01/10/20  Yes Emokpae, Courage, MD  albuterol (PROVENTIL) (2.5 MG/3ML) 0.083% nebulizer solution Take 3 mLs (2.5 mg total) by nebulization every 4 (four) hours as needed for wheezing or shortness of breath. 01/07/21  Yes Nyoka Cowden, MD  albuterol (VENTOLIN HFA) 108 (90 Base) MCG/ACT inhaler Inhale 2 puffs into the lungs every 4 (four) hours as needed for wheezing or shortness of breath. 12/04/19  Yes Eber Hong, MD  aspirin EC 81 MG tablet Take 1 tablet (81 mg total) by mouth daily with breakfast. 01/10/20  Yes Emokpae, Courage, MD  atorvastatin (LIPITOR) 20 MG tablet Take 1 tablet (20 mg total) by mouth daily. For high cholesterol Patient taking differently: Take 20 mg by mouth every evening. For high cholesterol 02/09/18  Yes Money, Gerlene Burdock, FNP  bisoprolol (ZEBETA) 5 MG tablet Take 1 tablet (5 mg total) by mouth daily. 01/07/21  Yes Nyoka Cowden, MD  Budeson-Glycopyrrol-Formoterol (BREZTRI AEROSPHERE) 160-9-4.8 MCG/ACT AERO Take 2 puffs first thing in am and then another 2 puffs about 12 hours later. 02/14/21  Yes Nyoka Cowden, MD  diltiazem  (CARDIZEM CD) 180 MG 24 hr capsule Take 180 mg by mouth daily. 12/25/19  Yes [provider]  divalproex (DEPAKOTE ER) 500 MG 24 hr tablet Take 3 tablets (1,500 mg total) by mouth at bedtime. 01/22/21  Yes Myrlene Broker, MD  FLUoxetine (PROZAC) 20 MG capsule Take 1 capsule (20 mg total) by mouth daily. 01/22/21  Yes Myrlene Broker, MD  folic acid (FOLVITE) 1 MG tablet Take 1 mg by mouth every morning.  11/29/19  Yes [provider]  furosemide (LASIX) 40 MG tablet TAKE 40 MG DAILY MAY TAKE ADDITIONAL 20 MG AS NEEDED Patient taking differently: TAKE 40 MG DAILY MAY TAKE ADDITIONAL 20 MG (takes 1.5 tabs) 01/16/21  Yes Branch, Dorothe Pea, MD  gabapentin (NEURONTIN) 400 MG capsule Take 400 mg by mouth 3 (three) times daily.   Yes [provider]  guaiFENesin (MUCINEX) 600 MG 12 hr tablet Take 1 tablet (600 mg total) by mouth 2 (two) times daily as needed for cough or to loosen phlegm. 01/10/20  Yes Shon Hale, MD  Multiple Vitamin (MULTIVITAMIN) tablet Take 1 tablet by mouth daily.   Yes [provider]  OLANZapine (ZYPREXA) 10 MG tablet Take 1 tablet (10 mg total) by mouth 2 (two) times daily. 01/22/21 01/22/22 Yes Myrlene Broker, MD  pantoprazole (PROTONIX) 20 MG tablet Take 20 mg by mouth daily.   Yes [provider]  potassium chloride SA (KLOR-CON) 20 MEQ tablet Take 1 tablet (20 mEq total) by mouth 2 (two) times daily. 03/24/21  Yes Triplett, Tammy, PA-C  propranolol (INDERAL) 10 MG tablet Take 10 mg by mouth 2 (two) times daily. 04/10/21  Yes [provider]  thiamine 100 MG tablet Take 100 mg by mouth daily.   Yes [provider]  traZODone (DESYREL) 50 MG tablet Take 1 tablet (50 mg total) by mouth at bedtime. 01/22/21  Yes Myrlene Broker, MD  vitamin B-12 (CYANOCOBALAMIN) 500 MCG tablet Take 1,000 mcg by mouth every morning.    Yes [provider]  doxycycline (VIBRAMYCIN) 100 MG capsule Take 1 capsule (100 mg total) by mouth 2  (two) times daily. Patient not taking: No sig reported 03/24/21   Triplett, Tammy, PA-C  oxymetazoline (AFRIN) 0.05 % nasal spray Place 1 spray into both nostrils daily. Patient not taking: No sig reported  [provider]  predniSONE (DELTASONE) 10 MG tablet Take by mouth. Patient not taking: No sig reported 03/26/21   [provider]    Allergies    Augmentin [amoxicillin-pot clavulanate] and Ace inhibitors  Review of Systems   Review of Systems  All other systems reviewed and are negative.   Physical Exam Updated Vital Signs BP 111/78   Pulse 86   Temp 97.9 F (36.6 C) (Oral)   Resp 19   Ht 1.778 m (5\' 10" )   Wt 83.9 kg   SpO2 99%   BMI 26.54 kg/m   Physical Exam Vitals and nursing note reviewed.  Constitutional:      Appearance: He is well-developed. He is not diaphoretic.  HENT:     Head: Normocephalic and atraumatic.     Right Ear: External ear normal.     Left Ear: External ear normal.  Eyes:     General: No scleral icterus.       Right eye: No discharge.        Left eye: No discharge.     Conjunctiva/sclera: Conjunctivae normal.  Neck:     Trachea: No tracheal deviation.  Cardiovascular:     Rate and Rhythm: Normal rate and regular rhythm.  Pulmonary:     Effort: Pulmonary effort is normal. No respiratory distress.     Breath sounds: No stridor. Decreased breath sounds, wheezing and rales present.  Abdominal:     General: Bowel sounds are normal. There is no distension.     Palpations: Abdomen is soft.     Tenderness: There is no abdominal tenderness. There is no guarding or rebound.  Musculoskeletal:        General: No tenderness.     Cervical back: Neck supple.  Skin:    General: Skin is warm and dry.     Findings: No rash.  Neurological:     Mental Status: He is alert.     Cranial Nerves: No cranial nerve deficit (no facial droop, extraocular movements intact, no slurred speech).     Sensory: No sensory deficit.     Motor: No  abnormal muscle tone or seizure activity.     Coordination: Coordination normal.     ED Results / Procedures / Treatments   Labs (all labs ordered are listed, but only abnormal results are displayed) Labs Reviewed  RESP PANEL BY RT-PCR (FLU A&B, COVID) ARPGX2 - Abnormal; Notable for the following components:      Result Value   SARS Coronavirus 2 by RT PCR POSITIVE (*)    All other components within normal limits  BASIC METABOLIC PANEL - Abnormal; Notable for the following components:   Chloride 78 (*)    CO2 47 (*)    Glucose, Bld 130 (*)    Creatinine, Ser 0.55 (*)    All other components within normal limits  BLOOD GAS, VENOUS - Abnormal; Notable for the following components:   pCO2, Ven 112 (*)    Bicarbonate 45.1 (*)    Acid-Base Excess 26.2 (*)    All other components within normal limits  CBC  BRAIN NATRIURETIC PEPTIDE  BLOOD GAS, ARTERIAL    EKG EKG Interpretation  Date/Time:  Wednesday Apr 16 2021 18:05:32 EDT Ventricular Rate:  70 PR Interval:  121 QRS Duration: 91 QT Interval:  392 QTC Calculation: 423 R Axis:   75 Text Interpretation: Sinus rhythm No significant change since last tracing Confirmed by Linwood Dibbles [phone]) on 03/25/2021 6:16:17 PM   Radiology  DG Chest Port 1 View  Result Date: 03/27/2021 CLINICAL DATA:  Dyspnea, shortness of breath, diminished lung sounds EXAM: PORTABLE CHEST 1 VIEW COMPARISON:  CT 03/24/2021, radiograph 03/24/2021 FINDINGS: Coarsened reticulonodular opacities and bronchitic change with a basilar predominance, similar to comparison exam. No new consolidative opacity is seen. No pneumothorax or effusion. The aorta is calcified. The remaining cardiomediastinal contours are unremarkable. No acute osseous or soft tissue abnormality. Telemetry leads overlie the chest. IMPRESSION: Coarsened reticulonodular and bronchitic changes, most pronounced in the mid to lower lungs much of which could reflect some chronic interstitial changes and  COPD though a superimposed atypical infectious or inflammatory process is not entirely excluded. Redemonstration of a pulmonary nodule in the mid left lung. Linear opacity in the left lung base, possible scarring or atelectasis. Aortic Atherosclerosis (ICD10-I70.0). Electronically Signed   By: Kreg Shropshire M.D.   On: 04/04/2021 19:20    Procedures .Critical Care Performed by: Linwood Dibbles, MD Authorized by: Linwood Dibbles, MD   Critical care provider statement:    Critical care time (minutes):  45   Critical care was time spent personally by me on the following activities:  Discussions with consultants, evaluation of patient's response to treatment, examination of patient, ordering and performing treatments and interventions, ordering and review of laboratory studies, ordering and review of radiographic studies, pulse oximetry, re-evaluation of patient's condition, obtaining history from patient or surrogate and review of old charts     Medications Ordered in ED Medications  dexamethasone (DECADRON) injection 6 mg (has no administration in time range)  methylPREDNISolone sodium succinate (SOLU-MEDROL) 125 mg/2 mL injection 125 mg (125 mg Intravenous Given 03/29/2021 1830)  albuterol (VENTOLIN HFA) 108 (90 Base) MCG/ACT inhaler 4 puff (4 puffs Inhalation Given 04/06/2021 1831)  LORazepam (ATIVAN) injection 0.5 mg (0.5 mg Intravenous Given 03/24/2021 2117)    ED Course  I have reviewed the triage vital signs and the nursing notes.  Pertinent labs & imaging results that were available during my care of the patient were reviewed by me and considered in my medical decision making (see chart for details).  Clinical Course as of 04/01/2021 2149  Wed Apr 16, 2021  1919 Venous PCO2 is 112.  Patient's oxygen is 94%.  We will start him on BiPAP.  He is awake and alert [JK]  2110 Remains awake and alert.  Possibly some confusion was hypercarbia although unclear if some of this is behavioral.  I have stressed the  importance of making sure to keep his mask on.  We will try a small dose of Ativan.  We will continue to monitor for right now I do not feel intubation is necessary [JK]  2148 Covid test is positive [JK]    Clinical Course User Index [JK] Linwood Dibbles, MD   MDM Rules/Calculators/A&P                          Patient has history of severe COPD and chronic respiratory failure.  Patient presented to ED with worsening shortness of breath.  He was wheezing on exam.  Patient improved with initial EMS treatment.  He was oxygenating well and was feeling somewhat better.  Patient's laboratory test however showed the he did have an elevated PCO2 on his venous blood gas.  His pH however was not significantly decreased.  Suspect significant component of chronic hypercarbia with his elevated bicarb on his metabolic panel.  Patient was started on BiPAP.  He has improved.  COVID test is positive.  Patient has been treated with steroids.  I will consult the medical service for admission further treatment. Final Clinical Impression(s) / ED Diagnoses Final diagnoses:  COPD exacerbation (HCC)  COVID-19 virus infection  Hypercarbia     Linwood Dibbles, MD 04/01/2021 2150

## 2021-04-16 NOTE — ED Notes (Signed)
Pt not being complient with BIPAP. Pt keeps removing mask and pt encouraged to keep it on. Will continue to monitor pt.

## 2021-04-16 NOTE — ED Triage Notes (Addendum)
Pt was brought in by [facility] with c/o SOB. EMS reports pt had very diminished lung sounds upon their arrival with 4L O2 via [place] (what he normally wears at home) and 100% SpO2. Pt was given Albuterol 5mg  neb treatment and then another treatment with Albuterol 2.5mg  and Atrovent 0.5mg , and Solumedrol 125mg  IV. Oxygen was also increased to 5L by EMS and they report pt looks much better now.

## 2021-04-16 NOTE — Progress Notes (Signed)
Patient continues to rip bipap machine off. He took entire circuit apart, mask, ripped the cords out of the wall and tied machine to the door and was on room air upon RT arrival with an o2 sat of 75%. Patient has taken machine off multiple times and continues to be non-compliant. Placed patient on 3 lpm nasal cannula and made RN aware.

## 2021-04-16 NOTE — ED Notes (Signed)
Rt at bedside, pt placed on nasal cannula 3L pt satting in the 90s, resps even and unlabored.

## 2021-04-16 NOTE — ED Notes (Addendum)
Pt pulled all equipment and and leads off stating he wanted to go home. Pt educated on his condition and that if he were to leave it could get worse. EDP made aware and talked to pt. Pt willing to stay and calm at this time. Equipment reapplied.   Respiratory at bedside to put pt on BiPAP.

## 2021-04-16 NOTE — H&P (Signed)
TRH H&P   Patient Demographics:    Jeffrey [name], is a 60 y.o. male  MRN: [id number]   DOB - 12-29-1960  Admit Date - 04-20-21  Outpatient Primary MD for the patient is Gareth Morgan, MD  Referring MD/NP/PA: Dr Lynelle Doctor  Patient coming from: Home  Chief Complaint  Patient presents with  . Shortness of Breath      HPI:    Jeffrey [name]  is a 60 y.o. male, with medical history significant for hypertension, hyperlipidemia, COPD (on 3-4 LPM of supplemental oxygen via [place] at baseline), CHF, GERD, as well on home BiPAP machine, with very poor compliance, oriented, who presents to ED secondary to shortness of breath, reports he started to feel worsening shortness of breath today, where he had to call EMS, as his home nebulizer machine has not been helping, he was given albuterol, Atrovent, as well Solu-Medrol, he reports improvement of his symptoms after EMS treatment, he denies any fever, chills, nausea, vomiting, no abdominal pain, no COVID exposures, no chest pain, no leg swelling, reports he received his COVID-vaccine and booster. -in ED patient is mildly confused, but awake, ABG showing PCO2 retention of 112, pH of 7.3, his COVID-19 test was positive, his chest x-ray chronic some chronic interstitial changes and COPD, with possible superimposed atypical infection, [facility] consulted to admit .    Review of systems:    In addition to the HPI above,  No Fever-chills, No Headache, No changes with Vision or hearing, No problems swallowing food or Liquids, No Chest pain, does report shortness of breath No Abdominal pain, No Nausea or Vommitting, Bowel movements are regular, No Blood in stool or Urine, No dysuria, No new skin rashes or bruises, No new joints pains-aches,  No new weakness, tingling, numbness in any extremity, No recent weight gain or loss, No polyuria,  polydypsia or polyphagia, No significant Mental Stressors.  A full 10 point Review of Systems was done, except as stated above, all other Review of Systems were negative.   With Past History of the following :    Past Medical History:  Diagnosis Date  . Acute blood loss anemia 02/07/2017  . Anxiety   . Arthritis    deg disease, bulging disk,  shoulder level  . Bipolar disorder (HCC)   . Bipolar disorder (HCC)   . CHF (congestive heart failure) (HCC)   . 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 Depression    anxiety  . Hyperlipidemia   . Hypertension   . Peptic ulcer disease    Review  . Pneumonia   . [name] (peripheral vascular disease) (HCC) 06/18/2015  . Shortness of breath       Past Surgical History:  Procedure Laterality Date  . BIOPSY  02/09/2017   Procedure: BIOPSY;  Surgeon: [name], MD;  Location: [facility];  Service: Endoscopy;;  duodenal gastric  . COLONOSCOPY  03/14/2007   KDX:[id number] colonoscopy and terminal ileoscopy except external hemorrhoids  . COLONOSCOPY N/A 05/05/2013   [name]: external/internal anal canal hemorrhoids, unable to intubate TI, segemental biopsies unremarkable   . COLONOSCOPY N/A 04/11/2017   Procedure: COLONOSCOPY;  Surgeon: [name], MD;  Location: [facility];  Service: Endoscopy;  Laterality: N/A;  . COLONOSCOPY WITH PROPOFOL N/A 03/31/2017   Procedure: COLONOSCOPY WITH PROPOFOL;  Surgeon: [name], MD;  Location: [facility];  Service: Endoscopy;  Laterality: N/A;  1:45pm  . ESOPHAGOGASTRODUODENOSCOPY  03/14/2007   KNL:[id number] antral gastritis with bulbar duodenitis/paucity to postbulbar duodenal folds and biopsy were benign with no evidence of villous atrophy.  . ESOPHAGOGASTRODUODENOSCOPY (EGD) WITH PROPOFOL N/A 02/09/2017   Procedure: ESOPHAGOGASTRODUODENOSCOPY (EGD) WITH PROPOFOL;  Surgeon: [name], MD;  Location: [facility];  Service: Endoscopy;  Laterality: N/A;  . GIVENS CAPSULE STUDY N/A 04/08/2017    Procedure: GIVENS CAPSULE STUDY;  Surgeon: [name], MD;  Location: [facility];  Service: Endoscopy;  Laterality: N/A;  . HAND SURGERY     left, secondary to self-inflicted laceration  . HEMORRHOID SURGERY N/A 04/14/2017   Procedure: HEMORRHOIDECTOMY;  Surgeon: [name], MD;  Location: [facility];  Service: General;  Laterality: N/A;  . SHOULDER SURGERY     right  . TOE SURGERY     left great toe , amputated-lawnmover accident      Social History:     Social History   Tobacco Use  . Smoking status: Former Smoker    Packs/day: 1.00    Years: 40.00    Pack years: 40.00    Types: Cigarettes    Start date: 08/04/1971    Quit date: 01/09/2021    Years since quitting: 0.2  . Smokeless tobacco: Former Neurosurgeon    Quit date: 09/30/2019  . Tobacco comment: peak rate of 2.5ppd, 1/2ppd on 11/27/2016 -- 6 cigarettes / day 01/25/18  Substance Use Topics  . Alcohol use: No    Alcohol/week: 0.0 standard drinks      Family History :     Family History  Problem Relation Age of Onset  . Breast cancer Mother        deceased  . Heart disease Father   . Depression Daughter   . Anxiety disorder Daughter   . Anxiety disorder Son   . Depression Son   . Asthma Brother   . Heart attack Maternal Aunt   . Heart attack Maternal Uncle   . Heart attack Paternal Aunt   . Heart attack Paternal Uncle   . Heart attack Maternal Grandmother   . Heart attack Maternal Grandfather   . Emphysema Maternal Grandfather   . Heart attack Paternal Grandmother   . Heart attack Paternal Grandfather   . Colon cancer Neg Hx   . Liver disease Neg Hx      Home Medications:   Prior to Admission medications   Medication Sig Start Date End Date Taking? Authorizing Provider  acetaminophen (TYLENOL) 325 MG tablet Take 2 tablets (650 mg total) by mouth every 6 (six) hours as needed for mild pain, fever or headache (or Fever >/= 101). 01/10/20  Yes Emokpae, Courage, MD  albuterol (PROVENTIL) (2.5 MG/3ML)  0.083% nebulizer solution Take 3 mLs (2.5 mg total) by nebulization every 4 (four) hours as needed for wheezing or shortness of breath. 01/07/21  Yes Nyoka Cowden, MD  albuterol (VENTOLIN HFA) 108 (90 Base) MCG/ACT inhaler Inhale 2 puffs into the  lungs every 4 (four) hours as needed for wheezing or shortness of breath. 12/04/19  Yes Eber HongMiller, Brian, MD  aspirin EC 81 MG tablet Take 1 tablet (81 mg total) by mouth daily with breakfast. 01/10/20  Yes Emokpae, Courage, MD  atorvastatin (LIPITOR) 20 MG tablet Take 1 tablet (20 mg total) by mouth daily. For high cholesterol Patient taking differently: Take 20 mg by mouth every evening. For high cholesterol 02/09/18  Yes Money, Gerlene Burdockravis B, FNP  bisoprolol (ZEBETA) 5 MG tablet Take 1 tablet (5 mg total) by mouth daily. 01/07/21  Yes Nyoka CowdenWert, Michael B, MD  Budeson-Glycopyrrol-Formoterol (BREZTRI AEROSPHERE) 160-9-4.8 MCG/ACT AERO Take 2 puffs first thing in am and then another 2 puffs about 12 hours later. 02/14/21  Yes Nyoka CowdenWert, Michael B, MD  diltiazem (CARDIZEM CD) 180 MG 24 hr capsule Take 180 mg by mouth daily. 12/25/19  Yes [provider]  divalproex (DEPAKOTE ER) 500 MG 24 hr tablet Take 3 tablets (1,500 mg total) by mouth at bedtime. 01/22/21  Yes Myrlene Brokeross, Deborah R, MD  FLUoxetine (PROZAC) 20 MG capsule Take 1 capsule (20 mg total) by mouth daily. 01/22/21  Yes Myrlene Brokeross, Deborah R, MD  folic acid (FOLVITE) 1 MG tablet Take 1 mg by mouth every morning.  11/29/19  Yes [provider]  furosemide (LASIX) 40 MG tablet TAKE 40 MG DAILY MAY TAKE ADDITIONAL 20 MG AS NEEDED Patient taking differently: TAKE 40 MG DAILY MAY TAKE ADDITIONAL 20 MG (takes 1.5 tabs) 01/16/21  Yes Branch, Dorothe PeaJonathan F, MD  gabapentin (NEURONTIN) 400 MG capsule Take 400 mg by mouth 3 (three) times daily.   Yes [provider]  guaiFENesin (MUCINEX) 600 MG 12 hr tablet Take 1 tablet (600 mg total) by mouth 2 (two) times daily as needed for cough or to loosen phlegm. 01/10/20  Yes Shon HaleEmokpae,  Courage, MD  Multiple Vitamin (MULTIVITAMIN) tablet Take 1 tablet by mouth daily.   Yes [provider]  OLANZapine (ZYPREXA) 10 MG tablet Take 1 tablet (10 mg total) by mouth 2 (two) times daily. 01/22/21 01/22/22 Yes Myrlene Brokeross, Deborah R, MD  pantoprazole (PROTONIX) 20 MG tablet Take 20 mg by mouth daily.   Yes [provider]  potassium chloride SA (KLOR-CON) 20 MEQ tablet Take 1 tablet (20 mEq total) by mouth 2 (two) times daily. 03/24/21  Yes Triplett, Tammy, PA-C  propranolol (INDERAL) 10 MG tablet Take 10 mg by mouth 2 (two) times daily. 04/10/21  Yes [provider]  thiamine 100 MG tablet Take 100 mg by mouth daily.   Yes [provider]  traZODone (DESYREL) 50 MG tablet Take 1 tablet (50 mg total) by mouth at bedtime. 01/22/21  Yes Myrlene Brokeross, Deborah R, MD  vitamin B-12 (CYANOCOBALAMIN) 500 MCG tablet Take 1,000 mcg by mouth every morning.    Yes [provider]  doxycycline (VIBRAMYCIN) 100 MG capsule Take 1 capsule (100 mg total) by mouth 2 (two) times daily. Patient not taking: No sig reported 03/24/21   Triplett, Tammy, PA-C  oxymetazoline (AFRIN) 0.05 % nasal spray Place 1 spray into both nostrils daily. Patient not taking: No sig reported    [provider]  predniSONE (DELTASONE) 10 MG tablet Take by mouth. Patient not taking: No sig reported 03/26/21   [provider]     Allergies:     Allergies  Allergen Reactions  . Augmentin [Amoxicillin-Pot Clavulanate] Rash and Other (See Comments)    Tolerated Zosyn 12/27/2019 Has patient had a PCN reaction causing immediate rash,  facial/tongue/throat swelling, SOB or lightheadedness with hypotension: No Has patient had a PCN reaction causing severe rash involving mucus membranes or skin necrosis: No Has patient had a PCN reaction that required hospitalization No Has patient had a PCN reaction occurring within the last 10 years: Yes If all of the above answers are "NO", then may proceed with  Cephalosporin use.  Donivan Scull Inhibitors Hives     Physical Exam:   Vitals  Blood pressure (!) 146/82, pulse 92, temperature 97.9 F (36.6 C), temperature source Oral, resp. rate (!) 22, height 5\' 10"  (1.778 m), weight 83.9 kg, SpO2 99 %.   1. General Juliane male, laying in bed on BiPAP, no apparent distress  2.  He is awake, alert, but confused, follow commands appropriately  3. No F.N deficits, ALL C.Nerves Intact, Strength 5/5 all 4 extremities, Sensation intact all 4 extremities, Plantars down going.  4. Ears and Eyes appear Normal, Conjunctivae clear, PERRLA. Moist Oral Mucosa.  5. Supple Neck, No JVD, No cervical lymphadenopathy appriciated, No Carotid Bruits.  6. Symmetrical Chest wall movement, Diminished air  entry with scattered wheezing  7. RRR, No Gallops, Rubs or Murmurs, No Parasternal Heave.  8. Positive Bowel Sounds, Abdomen Soft, No tenderness, No organomegaly appriciated,No rebound -guarding or rigidity.  9.  No Cyanosis, Normal Skin Turgor, No Skin Rash or Bruise.  10. Good muscle tone,  joints appear normal , no effusions, Normal ROM.  11. No Palpable Lymph Nodes in Neck or Axillae    Data Review:    CBC Recent Labs  Lab 04/15/2021 1838  WBC 9.3  HGB 13.3  HCT 41.1  PLT 258  MCV 92.8  MCH 30.0  MCHC 32.4  RDW 11.8   ------------------------------------------------------------------------------------------------------------------  Chemistries  Recent Labs  Lab 04/13/2021 1838  NA 137  K 4.1  CL 78*  CO2 47*  GLUCOSE 130*  BUN 12  CREATININE 0.55*  CALCIUM 9.1   ------------------------------------------------------------------------------------------------------------------ estimated creatinine clearance is 101.4 mL/min (A) (by C-G formula based on SCr of 0.55 mg/dL (L)). ------------------------------------------------------------------------------------------------------------------ No results for input(s): TSH, T4TOTAL, T3FREE,  THYROIDAB in the last 72 hours.  Invalid input(s): FREET3  Coagulation profile No results for input(s): INR, PROTIME in the last 168 hours. ------------------------------------------------------------------------------------------------------------------- No results for input(s): DDIMER in the last 72 hours. -------------------------------------------------------------------------------------------------------------------  Cardiac Enzymes No results for input(s): CKMB, TROPONINI, MYOGLOBIN in the last 168 hours.  Invalid input(s): CK ------------------------------------------------------------------------------------------------------------------    Component Value Date/Time   BNP 76.0 04/07/2021 1838     ---------------------------------------------------------------------------------------------------------------  Urinalysis    Component Value Date/Time   COLORURINE YELLOW 09/29/2020 0340   APPEARANCEUR CLEAR 09/29/2020 0340   LABSPEC 1.009 09/29/2020 0340   PHURINE 7.0 09/29/2020 0340   GLUCOSEU NEGATIVE 09/29/2020 0340   HGBUR MODERATE (A) 09/29/2020 0340   BILIRUBINUR NEGATIVE 09/29/2020 0340   KETONESUR NEGATIVE 09/29/2020 0340   PROTEINUR NEGATIVE 09/29/2020 0340   UROBILINOGEN 0.2 03/01/2011 1427   NITRITE NEGATIVE 09/29/2020 0340   LEUKOCYTESUR NEGATIVE 09/29/2020 0340    ----------------------------------------------------------------------------------------------------------------   Imaging Results:    DG Chest Port 1 View  Result Date: 04/15/2021 CLINICAL DATA:  Dyspnea, shortness of breath, diminished lung sounds EXAM: PORTABLE CHEST 1 VIEW COMPARISON:  CT 03/24/2021, radiograph 03/24/2021 FINDINGS: Coarsened reticulonodular opacities and bronchitic change with a basilar predominance, similar to comparison exam. No new consolidative opacity is seen. No pneumothorax or effusion. The aorta is calcified. The remaining cardiomediastinal contours are  unremarkable. No acute osseous or soft tissue abnormality. Telemetry leads  overlie the chest. IMPRESSION: Coarsened reticulonodular and bronchitic changes, most pronounced in the mid to lower lungs much of which could reflect some chronic interstitial changes and COPD though a superimposed atypical infectious or inflammatory process is not entirely excluded. Redemonstration of a pulmonary nodule in the mid left lung. Linear opacity in the left lung base, possible scarring or atelectasis. Aortic Atherosclerosis (ICD10-I70.0). Electronically Signed   By: Kreg Shropshire M.D.   On: 04/12/2021 19:20    My personal review of EKG: Rhythm NSR, Rate  70 /min, QTc 423 , no Acute ST changes   Assessment & Plan:    Active Problems:   Cigarette smoker   Chronic diastolic CHF (congestive heart failure) (HCC)   COPD exacerbation (HCC)   Essential hypertension   Acute on chronic respiratory failure with hypercapnia (HCC)   Acute metabolic encephalopathy   Acute on chronic diastolic CHF (congestive heart failure) (HCC)   Acute respiratory failure with hypercapnia (HCC)   (HFpEF) heart failure with preserved ejection fraction (HCC)    Acute on chronic hypoxic and hypercarbic respiratory failure secondary to COPD exacerbation, and COVID-19 of pneumonia -Patient presentation most likely related to seduce her Bash[place] morning then COVID-19 of pneumonia, please see discussion below. -He is with significant CO2 retention at 112, but overall he is in no respiratory distress, not lethargic or obtunded, he might be mildly confused but tolerating BiPAP, does not appear in any need of immediate intubation or any respiratory distress. -He will be treated with IV steroids for his Solu-Medrol, will be started on doxycycline, scheduled Combivent inhalers -We will keep him on continuous BiPAP overnight, and repeat ABG at 5 AM -Patient on home BiPAP, with noncompliance.  COVID 19 infection -He is vaccinated and boosted, this  was confirmed by his daughter -Is finding mainly related to CO2 retention, and not hypoxia, so has respiratory failure most likely in setting of COPD more than COVID-pneumonia, but given his poor baseline respiratory status he will be started on remdesivir, and he is on Solu-Medrol for COPD already.  Chronic diastolic CHF -Volume status at baseline, continue with home dose Lasix  Acute metabolic encephalopathy -Likely due to CO2 retention, he is awake, but he is mildly confused.  Bipolar disorder/Depression -Restarted on valproic acid and Prozac.   DVT Prophylaxis   Lovenox  AM Labs Ordered, also please review Full Orders  Family Communication: Admission, patients condition and plan of care including tests being ordered have been discussed with the patient and daughter by phone* who indicate understanding and agree with the plan and Code Status.  Code Status Full  Likely DC to  home  Condition GUARDED   Consults called: none    Admission status: inpatient    Time spent in minutes : 65 minutes   Huey Bienenstock M.D on 03/23/2021 at 10:21 PM   [facility] - Office  [phone]

## 2021-04-17 DIAGNOSIS — J441 Chronic obstructive pulmonary disease with (acute) exacerbation: Secondary | ICD-10-CM | POA: Diagnosis not present

## 2021-04-17 DIAGNOSIS — R0689 Other abnormalities of breathing: Secondary | ICD-10-CM

## 2021-04-17 DIAGNOSIS — F17210 Nicotine dependence, cigarettes, uncomplicated: Secondary | ICD-10-CM

## 2021-04-17 DIAGNOSIS — I5032 Chronic diastolic (congestive) heart failure: Secondary | ICD-10-CM | POA: Diagnosis not present

## 2021-04-17 DIAGNOSIS — J9622 Acute and chronic respiratory failure with hypercapnia: Secondary | ICD-10-CM | POA: Diagnosis not present

## 2021-04-17 DIAGNOSIS — G9341 Metabolic encephalopathy: Secondary | ICD-10-CM | POA: Diagnosis not present

## 2021-04-17 DIAGNOSIS — J9602 Acute respiratory failure with hypercapnia: Secondary | ICD-10-CM | POA: Diagnosis not present

## 2021-04-17 LAB — BLOOD GAS, ARTERIAL
Acid-Base Excess: 22.6 mmol/L — ABNORMAL HIGH (ref 0.0–2.0)
Bicarbonate: 44.8 mmol/L — ABNORMAL HIGH (ref 20.0–28.0)
FIO2: 32
O2 Saturation: 97.6 %
Patient temperature: 37
pCO2 arterial: 75.1 mmHg (ref 32.0–48.0)
pH, Arterial: 7.428 (ref 7.350–7.450)
pO2, Arterial: 95.8 mmHg (ref 83.0–108.0)

## 2021-04-17 LAB — COMPREHENSIVE METABOLIC PANEL
ALT: 23 U/L (ref 0–44)
AST: 26 U/L (ref 15–41)
Albumin: 3.3 g/dL — ABNORMAL LOW (ref 3.5–5.0)
Alkaline Phosphatase: 93 U/L (ref 38–126)
Anion gap: 14 (ref 5–15)
BUN: 15 mg/dL (ref 6–20)
CO2: 43 mmol/L — ABNORMAL HIGH (ref 22–32)
Calcium: 9.2 mg/dL (ref 8.9–10.3)
Chloride: 78 mmol/L — ABNORMAL LOW (ref 98–111)
Creatinine, Ser: 0.57 mg/dL — ABNORMAL LOW (ref 0.61–1.24)
GFR, Estimated: >60 mL/min (ref >60)
Glucose, Bld: 127 mg/dL — ABNORMAL HIGH (ref 70–99)
Potassium: 3.3 mmol/L — ABNORMAL LOW (ref 3.5–5.1)
Sodium: 135 mmol/L (ref 135–145)
Total Bilirubin: 0.6 mg/dL (ref 0.3–1.2)
Total Protein: 6.9 g/dL (ref 6.5–8.1)

## 2021-04-17 LAB — CBC
HCT: 38.1 % — ABNORMAL LOW (ref 39.0–52.0)
Hemoglobin: 12.2 g/dL — ABNORMAL LOW (ref 13.0–17.0)
MCH: 29.3 pg (ref 26.0–34.0)
MCHC: 32 g/dL (ref 30.0–36.0)
MCV: 91.4 fL (ref 80.0–100.0)
Platelets: 250 10*3/uL (ref 150–400)
RBC: 4.17 MIL/uL — ABNORMAL LOW (ref 4.22–5.81)
RDW: 11.7 % (ref 11.5–15.5)
WBC: 5.1 10*3/uL (ref 4.0–10.5)
nRBC: 0 % (ref 0.0–0.2)

## 2021-04-17 LAB — BLOOD GAS, VENOUS
Acid-Base Excess: 22.4 mmol/L — ABNORMAL HIGH (ref 0.0–2.0)
Bicarbonate: 43.9 mmol/L — ABNORMAL HIGH (ref 20.0–28.0)
FIO2: 32
O2 Saturation: 83.5 %
Patient temperature: 36.8
pCO2, Ven: 76.3 mmHg (ref 44.0–60.0)
pH, Ven: 7.42 (ref 7.250–7.430)
pO2, Ven: 46.4 mmHg — ABNORMAL HIGH (ref 32.0–45.0)

## 2021-04-17 LAB — MRSA PCR SCREENING: MRSA by PCR: NEGATIVE

## 2021-04-17 LAB — HIV ANTIBODY (ROUTINE TESTING W REFLEX): HIV Screen 4th Generation wRfx: NONREACTIVE

## 2021-04-17 MED ORDER — PANTOPRAZOLE SODIUM 20 MG PO TBEC
20.0000 mg | DELAYED_RELEASE_TABLET | Freq: Every day | ORAL | Status: DC
  Filled 2021-04-17 (×2): qty 1

## 2021-04-17 MED ORDER — HALOPERIDOL LACTATE 5 MG/ML IJ SOLN
5.0000 mg | Freq: Once | INTRAMUSCULAR | Status: AC
  Administered 2021-04-17: 5 mg via INTRAVENOUS
  Filled 2021-04-17: qty 1

## 2021-04-17 MED ORDER — ATORVASTATIN CALCIUM 20 MG PO TABS
20.0000 mg | ORAL_TABLET | Freq: Every evening | ORAL | Status: DC
  Filled 2021-04-17: qty 1

## 2021-04-17 MED ORDER — ENOXAPARIN SODIUM 40 MG/0.4ML IJ SOSY
40.0000 mg | PREFILLED_SYRINGE | INTRAMUSCULAR | Status: DC
  Administered 2021-04-17 – 2021-04-24 (×7): 40 mg via SUBCUTANEOUS
  Filled 2021-04-17 (×7): qty 0.4

## 2021-04-17 MED ORDER — IPRATROPIUM-ALBUTEROL 20-100 MCG/ACT IN AERS
1.0000 | INHALATION_SPRAY | Freq: Four times a day (QID) | RESPIRATORY_TRACT | Status: DC
  Administered 2021-04-17: 1 via RESPIRATORY_TRACT

## 2021-04-17 MED ORDER — LORAZEPAM 2 MG/ML IJ SOLN
1.0000 mg | INTRAMUSCULAR | Status: DC | PRN
  Administered 2021-04-17 – 2021-04-18 (×3): 2 mg via INTRAVENOUS
  Filled 2021-04-17 (×3): qty 1

## 2021-04-17 MED ORDER — VITAMIN B-12 1000 MCG PO TABS
1000.0000 ug | ORAL_TABLET | Freq: Every morning | ORAL | Status: DC
  Administered 2021-04-17: 1000 ug via ORAL

## 2021-04-17 MED ORDER — MOMETASONE FURO-FORMOTEROL FUM 200-5 MCG/ACT IN AERO
2.0000 | INHALATION_SPRAY | Freq: Two times a day (BID) | RESPIRATORY_TRACT | Status: DC
  Administered 2021-04-17 – 2021-04-18 (×2): 2 via RESPIRATORY_TRACT
  Filled 2021-04-17: qty 8.8

## 2021-04-17 MED ORDER — DEXMEDETOMIDINE HCL IN NACL 400 MCG/100ML IV SOLN
0.4000 ug/kg/h | INTRAVENOUS | Status: DC
  Administered 2021-04-17: 0.4 ug/kg/h via INTRAVENOUS
  Administered 2021-04-18: 0.6 ug/kg/h via INTRAVENOUS
  Administered 2021-04-18: 1 ug/kg/h via INTRAVENOUS
  Administered 2021-04-18: 0.6 ug/kg/h via INTRAVENOUS
  Administered 2021-04-18: 0.8 ug/kg/h via INTRAVENOUS
  Administered 2021-04-19: 0.7 ug/kg/h via INTRAVENOUS
  Administered 2021-04-19 (×2): 0.8 ug/kg/h via INTRAVENOUS
  Administered 2021-04-20: 0.5 ug/kg/h via INTRAVENOUS
  Administered 2021-04-20: 0.7 ug/kg/h via INTRAVENOUS
  Administered 2021-04-20: 1 ug/kg/h via INTRAVENOUS
  Filled 2021-04-17 (×11): qty 100
  Filled 2021-04-17: qty 200
  Filled 2021-04-17 (×2): qty 100

## 2021-04-17 MED ORDER — OLANZAPINE 5 MG PO TABS
10.0000 mg | ORAL_TABLET | Freq: Two times a day (BID) | ORAL | Status: DC
  Administered 2021-04-17 (×3): 10 mg via ORAL
  Filled 2021-04-17 (×3): qty 2

## 2021-04-17 MED ORDER — MAGNESIUM SULFATE IN D5W 1-5 GM/100ML-% IV SOLN
1.0000 g | Freq: Once | INTRAVENOUS | Status: AC
  Administered 2021-04-17: 1 g via INTRAVENOUS
  Filled 2021-04-17: qty 100

## 2021-04-17 MED ORDER — THIAMINE HCL 100 MG PO TABS
100.0000 mg | ORAL_TABLET | Freq: Every day | ORAL | Status: DC
  Administered 2021-04-17: 100 mg via ORAL
  Filled 2021-04-17: qty 1

## 2021-04-17 MED ORDER — FLUOXETINE HCL 20 MG PO CAPS
20.0000 mg | ORAL_CAPSULE | Freq: Every day | ORAL | Status: DC
  Administered 2021-04-17: 20 mg via ORAL
  Filled 2021-04-17: qty 1

## 2021-04-17 MED ORDER — TRAZODONE HCL 50 MG PO TABS
50.0000 mg | ORAL_TABLET | Freq: Every day | ORAL | Status: DC
  Administered 2021-04-17 (×2): 50 mg via ORAL
  Filled 2021-04-17 (×2): qty 1

## 2021-04-17 MED ORDER — PANTOPRAZOLE SODIUM 40 MG PO TBEC
40.0000 mg | DELAYED_RELEASE_TABLET | Freq: Every day | ORAL | Status: DC
  Administered 2021-04-17: 40 mg via ORAL
  Filled 2021-04-17: qty 1

## 2021-04-17 MED ORDER — METHYLPREDNISOLONE SODIUM SUCC 125 MG IJ SOLR
60.0000 mg | Freq: Three times a day (TID) | INTRAMUSCULAR | Status: DC
  Administered 2021-04-17 – 2021-04-21 (×12): 60 mg via INTRAVENOUS
  Filled 2021-04-17 (×11): qty 2

## 2021-04-17 MED ORDER — LORAZEPAM 2 MG/ML IJ SOLN
1.0000 mg | INTRAMUSCULAR | Status: DC | PRN
Start: 2021-04-17 — End: 2021-04-17
  Filled 2021-04-17: qty 1

## 2021-04-17 MED ORDER — GABAPENTIN 400 MG PO CAPS
400.0000 mg | ORAL_CAPSULE | Freq: Three times a day (TID) | ORAL | Status: DC
  Administered 2021-04-17 (×2): 400 mg via ORAL
  Filled 2021-04-17 (×3): qty 1

## 2021-04-17 MED ORDER — FOLIC ACID 1 MG PO TABS
1.0000 mg | ORAL_TABLET | Freq: Every morning | ORAL | Status: DC
  Administered 2021-04-17: 1 mg via ORAL
  Filled 2021-04-17: qty 1

## 2021-04-17 MED ORDER — CHLORHEXIDINE GLUCONATE CLOTH 2 % EX PADS
6.0000 | MEDICATED_PAD | Freq: Every day | CUTANEOUS | Status: DC
  Administered 2021-04-17 – 2021-04-24 (×9): 6 via TOPICAL

## 2021-04-17 MED ORDER — BISOPROLOL FUMARATE 5 MG PO TABS
5.0000 mg | ORAL_TABLET | Freq: Every day | ORAL | Status: DC
  Administered 2021-04-17: 5 mg via ORAL
  Filled 2021-04-17 (×2): qty 1

## 2021-04-17 MED ORDER — GUAIFENESIN ER 600 MG PO TB12: 600.0000 mg | ORAL_TABLET | Freq: Two times a day (BID) | ORAL | Status: DC | PRN

## 2021-04-17 MED ORDER — POTASSIUM CHLORIDE CRYS ER 20 MEQ PO TBCR
40.0000 meq | EXTENDED_RELEASE_TABLET | Freq: Once | ORAL | Status: DC
  Filled 2021-04-17: qty 2

## 2021-04-17 MED ORDER — ASPIRIN EC 81 MG PO TBEC
81.0000 mg | DELAYED_RELEASE_TABLET | Freq: Every day | ORAL | Status: DC
  Administered 2021-04-17: 81 mg via ORAL
  Filled 2021-04-17: qty 1

## 2021-04-17 MED ORDER — DILTIAZEM HCL ER COATED BEADS 180 MG PO CP24
180.0000 mg | ORAL_CAPSULE | Freq: Every day | ORAL | Status: DC
  Administered 2021-04-17: 180 mg via ORAL
  Filled 2021-04-17: qty 1

## 2021-04-17 MED ORDER — DIVALPROEX SODIUM ER 500 MG PO TB24
1500.0000 mg | ORAL_TABLET | Freq: Every day | ORAL | Status: DC
  Administered 2021-04-17 (×2): 1500 mg via ORAL
  Filled 2021-04-17 (×2): qty 3

## 2021-04-17 NOTE — Progress Notes (Signed)
Attempted to put patient back on BIPAP pt more agitated and still pulling leads off and trying to get out of bed. Pt mitts and restraints have to be removed to wear BIPAP per protocol and safety

## 2021-04-17 NOTE — Progress Notes (Signed)
PROGRESS NOTE    Jeffrey [name]  VOZ:[id number] DOB: November 19, 1961 DOA: 04/29/21 PCP: Gareth Morgan, MD   Chief Complaint  Patient presents with  . Shortness of Breath    Brief admission Narrative:  As per H&P written by Dr. Devoria Albe [name]  is a 60 y.o. male, with medical history significant forhypertension, hyperlipidemia, COPD(on 3-4 LPM of supplemental oxygen via [place] at baseline), CHF, GERD, as well on home BiPAP machine, with very poor compliance, oriented, who presents to ED secondary to shortness of breath, reports he started to feel worsening shortness of breath today, where he had to call EMS, as his home nebulizer machine has not been helping, he was given albuterol, Atrovent, as well Solu-Medrol, he reports improvement of his symptoms after EMS treatment, he denies any fever, chills, nausea, vomiting, no abdominal pain, no COVID exposures, no chest pain, no leg swelling, reports he received his COVID-vaccine and booster. -in ED patient is mildly confused, but awake, ABG showing PCO2 retention of 112, pH of 7.3, his COVID-19 test was positive, his chest x-ray chronic some chronic interstitial changes and COPD, with possible superimposed atypical infection, [facility] consulted to admit .  Assessment & Plan: 1-acute on chronic hypoxic and hypercarbic respiratory failure secondary to COPD exacerbation and COVID-19 pneumonia -Chest x-ray is also raising concern for component of bronchiectasis/bacterial infection -Continue treatment with remdesivir and adjusted dose of his steroids -Continue bronchodilators -Wean oxygen supplementation down to baseline and continue as needed use of BiPAP. -Appreciate assistance and recommendation by PCCM and will follow instructions.  2-alcohol abuse with withdrawal symptoms -Patient has been started on Precedex -Follow clinical response and CIWA score -Thiamine and folic acid has been added.  3-Cigarette smoker -Cessation  counseling provided.  4-Chronic diastolic CHF (congestive heart failure) (HCC) -Compensated currently -Continue home dose of Lasix -Daily weights-strict I's and O's. -Low-sodium diet has been ordered.  5-Essential hypertension -Stable and well-controlled -Continue current antihypertensive regimen.  DVT prophylaxis: Lovenox  Code Status: Full code. Family Communication: No family at bedside. Disposition:   Status is: Inpatient   Dispo: The patient is from: Home              Anticipated d/c is to: Home              Patient currently not medically stable for discharge; intermittently confused, agitated and with elevatedCIWA; has been started on Precedex and is actively receiving treatment for acute COVID infection along with COPD exacerbation and concern for bacterial pneumonia.   Difficult to place patient No    Consultants:   PCCM.   Procedures:  See below for x-ray reports.   Antimicrobials:  Doxycycline   Subjective: Early episode of severe agitation along with respiratory distress; patient with improvement since presentation on his hypercarbic episode after using BiPAP but no longer tolerating it and demonstrating increased anxiety, asterixis and intermittent confusion.  Objective: Vitals:   04/17/21 1200 04/17/21 1300 04/17/21 1400 04/17/21 1500  BP: 116/61  109/67 117/66  Pulse: 66 (!) 59 (!) 58 (!) 54  Resp: 16 19 13 18   Temp:      TempSrc:      SpO2: 95% 98% 98% 99%  Weight:      Height:        Intake/Output Summary (Last 24 hours) at 04/17/2021 1555 Last data filed at 04/17/2021 1255 Gross per 24 hour  Intake 390.52 ml  Output --  Net 390.52 ml   Filed Weights   2021/04/29 1751  Weight: 83.9 kg    Examination:  General exam: Currently less agitated after Precedex initiation; good oxygen saturation on oxygen supplementation.  No using accessory muscles and denying chest pain.  Answering simple questions appropriately; no fever. Respiratory  system: Positive scattered rhonchi right, no using accessory muscles.  Decreased breath sounds at the bases. Cardiovascular system: S1 and S2; no rubs, no gallops, no JVD on exam. Gastrointestinal system: Abdomen is nondistended, soft and nontender. No organomegaly or masses felt. Normal bowel sounds heard. Central nervous system: Alert and oriented. No focal neurological deficits. Extremities: No cyanosis or clubbing; trace edema appreciated bilaterally. Skin: No petechiae. Psychiatry: Patient was very agitated, requiring restraints and very anxious; felt to be secondary to alcohol withdrawal and has been started on Precedex.  Mood improved by the time of my examination.   Data Reviewed: I have personally reviewed following labs and imaging studies  CBC: Recent Labs  Lab 04/17/2021 1838 04/17/21 0410  WBC 9.3 5.1  HGB 13.3 12.2*  HCT 41.1 38.1*  MCV 92.8 91.4  PLT 258 250    Basic Metabolic Panel: Recent Labs  Lab 03/27/2021 1838 04/17/21 0410  NA 137 135  K 4.1 3.3*  CL 78* 78*  CO2 47* 43*  GLUCOSE 130* 127*  BUN 12 15  CREATININE 0.55* 0.57*  CALCIUM 9.1 9.2    GFR: Estimated Creatinine Clearance: 101.4 mL/min (A) (by C-G formula based on SCr of 0.57 mg/dL (L)).  Liver Function Tests: Recent Labs  Lab 04/17/21 0410  AST 26  ALT 23  ALKPHOS 93  BILITOT 0.6  PROT 6.9  ALBUMIN 3.3*     Recent Results (from the past 240 hour(s))  Resp Panel by RT-PCR (Flu A&B, Covid) Nasopharyngeal Swab     Status: Abnormal   Collection Time: 04/03/2021  6:13 PM   Specimen: Nasopharyngeal Swab; Nasopharyngeal(NP) swabs in vial transport medium  Result Value Ref Range Status   SARS Coronavirus 2 by RT PCR POSITIVE (A) NEGATIVE Final    Comment: RESULT CALLED TO, READ BACK BY AND VERIFIED WITH: K BELTON,RN@[id number]  04/01/2021 MKELLY (NOTE) SARS-CoV-2 target nucleic acids are DETECTED.  The SARS-CoV-2 RNA is generally detectable in upper respiratory specimens during the acute phase  of infection. Positive results are indicative of the presence of the identified virus, but do not rule out bacterial infection or co-infection with other pathogens not detected by the test. Clinical correlation with patient history and other diagnostic information is necessary to determine patient infection status. The expected result is Negative.  Fact Sheet for Patients: BloggerCourse.com  Fact Sheet for Healthcare Providers: SeriousBroker.it  This test is not yet approved or cleared by the [place] FDA and  has been authorized for detection and/or diagnosis of SARS-CoV-2 by FDA under an Emergency Use Authorization (EUA).  This EUA will remain in effect (meaning this test can be  used) for the duration of  the COVID-19 declaration under Section 564(b)(1) of the Act, 21 U.S.C. section 360bbb-3(b)(1), unless the authorization is terminated or revoked sooner.     Influenza A by PCR NEGATIVE NEGATIVE Final   Influenza B by PCR NEGATIVE NEGATIVE Final    Comment: (NOTE) The Xpert Xpress SARS-CoV-2/FLU/RSV plus assay is intended as an aid in the diagnosis of influenza from Nasopharyngeal swab specimens and should not be used as a sole basis for treatment. Nasal washings and aspirates are unacceptable for Xpert Xpress SARS-CoV-2/FLU/RSV testing.  Fact Sheet for Patients: BloggerCourse.com  Fact Sheet for Healthcare Providers: SeriousBroker.it  This  test is not yet approved or cleared by the [place] and has been authorized for detection and/or diagnosis of SARS-CoV-2 by FDA under an Emergency Use Authorization (EUA). This EUA will remain in effect (meaning this test can be used) for the duration of the COVID-19 declaration under Section 564(b)(1) of the Act, 21 U.S.C. section 360bbb-3(b)(1), unless the authorization is terminated or revoked.  Performed at [facility], [address] [place], [place] [address]   MRSA PCR Screening     Status: None   Collection Time: 04/17/21 12:46 AM   Specimen: Nasal Mucosa; Nasopharyngeal  Result Value Ref Range Status   MRSA by PCR NEGATIVE NEGATIVE Final    Comment:        The GeneXpert MRSA Assay (FDA approved for NASAL specimens only), is one component of a comprehensive MRSA colonization surveillance program. It is not intended to diagnose MRSA infection nor to guide or monitor treatment for MRSA infections. Performed at [facility], [address] [place], [place] [address]      Radiology Studies: DG Chest [facility] 1 View  Result Date: 05/15/2021 CLINICAL DATA:  Dyspnea, shortness of breath, diminished lung sounds EXAM: PORTABLE CHEST 1 VIEW COMPARISON:  CT 03/24/2021, radiograph 03/24/2021 FINDINGS: Coarsened reticulonodular opacities and bronchitic change with a basilar predominance, similar to comparison exam. No new consolidative opacity is seen. No pneumothorax or effusion. The aorta is calcified. The remaining cardiomediastinal contours are unremarkable. No acute osseous or soft tissue abnormality. Telemetry leads overlie the chest. IMPRESSION: Coarsened reticulonodular and bronchitic changes, most pronounced in the mid to lower lungs much of which could reflect some chronic interstitial changes and COPD though a superimposed atypical infectious or inflammatory process is not entirely excluded. Redemonstration of a pulmonary nodule in the mid left lung. Linear opacity in the left lung base, possible scarring or atelectasis. Aortic Atherosclerosis (ICD10-I70.0). Electronically Signed   By: Kreg Shropshire M.D.   On: 24-Apr-2021 19:20   Scheduled Meds: . aspirin EC  81 mg Oral Q breakfast  . atorvastatin  20 mg Oral QPM  . bisoprolol  5 mg Oral Daily  . Chlorhexidine Gluconate Cloth  6 each Topical Daily  . diltiazem  180 mg Oral Daily  . divalproex  1,500 mg Oral QHS  . enoxaparin (LOVENOX) injection  40  mg Subcutaneous Q24H  . FLUoxetine  20 mg Oral Daily  . folic acid  1 mg Oral q morning  . furosemide  40 mg Oral Daily  . gabapentin  400 mg Oral TID  . Ipratropium-Albuterol  1 puff Inhalation Q6H  . methylPREDNISolone (SOLU-MEDROL) injection  60 mg Intravenous Q8H  . OLANZapine  10 mg Oral BID  . pantoprazole  40 mg Oral Daily  . thiamine  100 mg Oral Daily  . traZODone  50 mg Oral QHS  . vitamin B-12  1,000 mcg Oral q morning   Continuous Infusions: . dexmedetomidine (PRECEDEX) IV infusion 0.7 mcg/kg/hr (04/17/21 1255)  . doxycycline (VIBRAMYCIN) IV 100 mg (04/17/21 1218)  . remdesivir 100 mg in NS 100 mL 100 mg (04/17/21 1449)     LOS: 1 day    Time spent: 35 minutes   Vassie Loll, MD [facility]   To contact the attending provider between 7A-7P or the covering provider during after hours 7P-7A, please log into the web site [email or web address] and access using universal [place] password for that web site. If you do not have the password, please call the hospital operator.  04/17/2021,  3:55 PM

## 2021-04-17 NOTE — Progress Notes (Signed)
Nurse called pt pulled BIPAP mask apart and off again. MD informed and lab will be getting venous blood gas to determine if patient should be intubated

## 2021-04-17 NOTE — Progress Notes (Signed)
Patient's blood gas is improving. He still refuses to wear BiPAP. He is trying to get out of bed - restraints ordered. RN advised not to attempt BiPAP while restraints are on. I have evaluated patient again. He is quite drowsy - most likely 2/2 haldol and trazodone. CO2 is improving, so this is not likely a change in mental status due to retention.

## 2021-04-17 NOTE — Consult Note (Signed)
NAME:  Jeffrey [name], MRN:  [id number], DOB:  11-09-1961, LOS: 1 ADMISSION DATE:  04/12/2021, CONSULTATION DATE:  04/17/2021  REFERRING MD:  Gwenlyn Perking, TRH, CHIEF COMPLAINT: Respiratory distress on BiPAP  History of Present Illness:  60 year old ex-smoker with known COPD and chronic hypoxic respiratory failure on 3 L oxygen, patient of Dr. Sherene Sires admitted with shortness of breath ongoing for a few days not relieved with short acting bronchodilators at home.  He reports nonproductive cough.  In the ED he was confused, ABG showed 7.30/112, COVID testing was positive. He is vaccinated and boosted, it was felt that he was more likely having a COPD exacerbation rather than infective COVID-pneumonia, he was placed on BiPAP, started on remdesivir and steroids. 5/26 increased agitation, not keeping BiPAP on, RN concerned about EtOH withdrawal, PCCM consulted emergently  Pertinent  Medical History  Chronic diastolic heart failure. 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 5/26 increased agitation , not tolerating BiPAP  Interim History / Subjective:    Objective   Blood pressure (!) 142/69, pulse 89, temperature 98.3 F (36.8 C), temperature source Oral, resp. rate (!) 24, height 5\' 10"  (1.778 m), weight 83.9 kg, SpO2 95 %.    Vent Mode: BIPAP FiO2 (%):  [30 %-40 %] 30 % Set Rate:  [15 bmp] 15 bmp PEEP:  [7 cmH20] 7 cmH20   Intake/Output Summary (Last 24 hours) at 04/17/2021 1010 Last data filed at 04/17/2021 04/19/2021 Gross per 24  hour  Intake 337.03 ml  Output --  Net 337.03 ml   Filed Weights   04/21/2021 1751  Weight: 83.9 kg    Examination: General: Acutely ill man, appears older than stated age, mild distress, in restraints, pulling BiPAP mask off HENT: Mild pallor, no icterus, no JVD Lungs: Bilateral faint scattered rhonchi, no accessory muscle use Cardiovascular: S1-S2 regular, no murmur Abdomen: Soft, nontender, no hepatomegaly Extremities: No deformity, no edema Neuro: Awake, responds appropriate one-step commands, asterixis present    Labs/imaging that I havepersonally reviewed  (right click and "Reselect all SmartList Selections" daily)  ABG 7.4 3/75/96 shows compensated hypercarbia  Labs show mild hypokalemia and mild hyponatremia , no leukocytosis  Chest x-ray shows bibasal coarse reticulonodular markings, left midlung pulmonary nodule  CT angiogram chest 03/24/2021 severe emphysema, left lower lobe nodule 1 time 0.8 cm, airspace disease in left greater than right lung base  Resolved Hospital Problem list     Assessment & Plan:  Acute on chronic hypercarbic respiratory failure Attributed to COPD exacerbation rather than COVID-pneumonia  -ABG shows compensated hypercarbia, is not tolerating BiPAP, in fact his agitation has increased to the point of requiring restraints so I do not think BiPAP is a good option anyways at this point, will discontinue BiPAP, nasal cannula saturations seem to hold at 93%  -Given his history of delirium with high-dose steroids, will decrease dose of steroids -Continue bronchodilators Pulmicort and Brovana, can resume Breztri as outpatient  Acute metabolic encephalopathy/agitated delirium  -Differential includes alcohol withdrawal, need to get a more detailed history of alcohol intake from his wife, previously noted delirium was  attributed to alcohol related dementia plus steroid delirium -Use Precedex with goal RASS 0 to -1, Ativan only for breakthrough  agitation on Precedex -Can resume his bipolar meds  COVID infection -agree with remdesivir and steroids -Given airspace disease would cover him with ceftriaxone/doxycycline  Best practice (right click and "Reselect all SmartList Selections" daily)  Diet:  Oral Pain/Anxiety/Delirium protocol (if indicated): Yes (RASS goal 0) VAP protocol (if indicated): Not indicated DVT prophylaxis: LMWH GI prophylaxis: N/A Glucose control:  SSI No Central venous access:  N/A Arterial line:  N/A Foley:  N/A Mobility:  OOB  PT consulted: Yes Last date of multidisciplinary goals of care discussion [per TRH] Code Status:  full code Disposition: ICU  Labs   CBC: Recent Labs  Lab 18-May-2021 1838 04/17/21 0410  WBC 9.3 5.1  HGB 13.3 12.2*  HCT 41.1 38.1*  MCV 92.8 91.4  PLT 258 250    Basic Metabolic Panel: Recent Labs  Lab 18-May-2021 1838 04/17/21 0410  NA 137 135  K 4.1 3.3*  CL 78* 78*  CO2 47* 43*  GLUCOSE 130* 127*  BUN 12 15  CREATININE 0.55* 0.57*  CALCIUM 9.1 9.2   GFR: Estimated Creatinine Clearance: 101.4 mL/min (A) (by C-G formula based on SCr of 0.57 mg/dL (L)). Recent Labs  Lab 18-May-2021 1838 04/17/21 0410  WBC 9.3 5.1    Liver Function Tests: Recent Labs  Lab 04/17/21 0410  AST 26  ALT 23  ALKPHOS 93  BILITOT 0.6  PROT 6.9  ALBUMIN 3.3*   No results for input(s): LIPASE, AMYLASE in the last 168 hours. No results for input(s): AMMONIA in the last 168 hours.  ABG    Component Value Date/Time   PHART 7.428 04/17/2021 0815   PCO2ART 75.1 (HH) 04/17/2021 0815   PO2ART 95.8 04/17/2021 0815   HCO3 44.8 (H) 04/17/2021 0815   TCO2 34 (H) 12/21/2018 0357   ACIDBASEDEF 0.6 02/17/2019 1850   O2SAT 97.6 04/17/2021 0815     Coagulation Profile: No results for input(s): INR, PROTIME in the last 168 hours.  Cardiac Enzymes: No results for input(s): CKTOTAL, CKMB, CKMBINDEX, TROPONINI in the last 168 hours.  HbA1C: No results found for: HGBA1C  CBG: No  results for input(s): GLUCAP in the last 168 hours.  Review of Systems:   Unable to obtain since agitated and uncooperative. Denies shortness of breath or chest pain  Past Medical History:  He,  has a past medical history of Acute blood loss anemia (02/07/2017), Anxiety, Arthritis, Bipolar disorder (HCC), Bipolar disorder (HCC), CHF (congestive heart failure) (HCC), COPD, severe (HCC) (10/09/2016), Depression, Hyperlipidemia, Hypertension, Peptic ulcer disease, Pneumonia, [name] (peripheral vascular disease) (HCC) (06/18/2015), and Shortness of breath.   Surgical History:   Past Surgical History:  Procedure Laterality Date  . BIOPSY  02/09/2017   Procedure: BIOPSY;  Surgeon: West BaliSandi L Fields, MD;  Location: [facility];  Service: Endoscopy;;  duodenal gastric  . COLONOSCOPY  03/14/2007   [id number]:Normal colonoscopy and terminal ileoscopy except external hemorrhoids  . COLONOSCOPY N/A 05/05/2013   Dr. Jena Gaussourk: external/internal anal canal hemorrhoids, unable to intubate TI, segemental biopsies unremarkable   . COLONOSCOPY N/A 04/11/2017   Procedure: COLONOSCOPY;  Surgeon: West BaliFields, Sandi L, MD;  Location: [facility];  Service: Endoscopy;  Laterality: N/A;  . COLONOSCOPY WITH PROPOFOL N/A 03/31/2017   Procedure: COLONOSCOPY WITH PROPOFOL;  Surgeon: Corbin Adeourk, Robert M, MD;  Location: [facility];  Service: Endoscopy;  Laterality: N/A;  1:45pm  . ESOPHAGOGASTRODUODENOSCOPY  03/14/2007  GGE:[id number] antral gastritis with bulbar duodenitis/paucity to postbulbar duodenal folds and biopsy were benign with no evidence of villous atrophy.  . ESOPHAGOGASTRODUODENOSCOPY (EGD) WITH PROPOFOL N/A 02/09/2017   Procedure: ESOPHAGOGASTRODUODENOSCOPY (EGD) WITH PROPOFOL;  Surgeon: [name], MD;  Location: [facility];  Service: Endoscopy;  Laterality: N/A;  . GIVENS CAPSULE STUDY N/A 04/08/2017   Procedure: GIVENS CAPSULE STUDY;  Surgeon: [name], MD;  Location: [facility];  Service: Endoscopy;   Laterality: N/A;  . HAND SURGERY     left, secondary to self-inflicted laceration  . HEMORRHOID SURGERY N/A 04/14/2017   Procedure: HEMORRHOIDECTOMY;  Surgeon: [name], MD;  Location: [facility];  Service: General;  Laterality: N/A;  . SHOULDER SURGERY     right  . TOE SURGERY     left great toe , amputated-lawnmover accident     Social History:   reports that he quit smoking about 3 months ago. His smoking use included cigarettes. He started smoking about 49 years ago. He has a 40.00 pack-year smoking history. He quit smokeless tobacco use about 18 months ago. He reports current drug use. Drug: Marijuana. He reports that he does not drink alcohol.   Family History:  His family history includes Anxiety disorder in his daughter and son; Asthma in his brother; Breast cancer in his mother; Depression in his daughter and son; Emphysema in his maternal grandfather; Heart attack in his maternal aunt, maternal grandfather, maternal grandmother, maternal uncle, paternal aunt, paternal grandfather, paternal grandmother, and paternal uncle; Heart disease in his father. There is no history of Colon cancer or Liver disease.   Allergies Allergies  Allergen Reactions  . Augmentin [Amoxicillin-Pot Clavulanate] Rash and Other (See Comments)    Tolerated Zosyn 12/27/2019 Has patient had a PCN reaction causing immediate rash, facial/tongue/throat swelling, SOB or lightheadedness with hypotension: No Has patient had a PCN reaction causing severe rash involving mucus membranes or skin necrosis: No Has patient had a PCN reaction that required hospitalization No Has patient had a PCN reaction occurring within the last 10 years: Yes If all of the above answers are "NO", then may proceed with Cephalosporin use.  Donivan Scull Inhibitors Hives     Home Medications  Prior to Admission medications   Medication Sig Start Date End Date Taking? Authorizing Provider  acetaminophen (TYLENOL) 325 MG tablet Take 2 tablets  (650 mg total) by mouth every 6 (six) hours as needed for mild pain, fever or headache (or Fever >/= 101). 01/10/20  Yes Emokpae, Courage, MD  albuterol (PROVENTIL) (2.5 MG/3ML) 0.083% nebulizer solution Take 3 mLs (2.5 mg total) by nebulization every 4 (four) hours as needed for wheezing or shortness of breath. 01/07/21  Yes Nyoka Cowden, MD  albuterol (VENTOLIN HFA) 108 (90 Base) MCG/ACT inhaler Inhale 2 puffs into the lungs every 4 (four) hours as needed for wheezing or shortness of breath. 12/04/19  Yes Eber Hong, MD  aspirin EC 81 MG tablet Take 1 tablet (81 mg total) by mouth daily with breakfast. 01/10/20  Yes Emokpae, Courage, MD  atorvastatin (LIPITOR) 20 MG tablet Take 1 tablet (20 mg total) by mouth daily. For high cholesterol Patient taking differently: Take 20 mg by mouth every evening. For high cholesterol 02/09/18  Yes Money, Gerlene Burdock, FNP  bisoprolol (ZEBETA) 5 MG tablet Take 1 tablet (5 mg total) by mouth daily. 01/07/21  Yes Nyoka Cowden, MD  Budeson-Glycopyrrol-Formoterol (BREZTRI AEROSPHERE) 160-9-4.8 MCG/ACT AERO Take 2 puffs first thing in am and then  another 2 puffs about 12 hours later. 02/14/21  Yes Nyoka Cowden, MD  diltiazem (CARDIZEM CD) 180 MG 24 hr capsule Take 180 mg by mouth daily. 12/25/19  Yes [provider]  divalproex (DEPAKOTE ER) 500 MG 24 hr tablet Take 3 tablets (1,500 mg total) by mouth at bedtime. 01/22/21  Yes Myrlene Broker, MD  FLUoxetine (PROZAC) 20 MG capsule Take 1 capsule (20 mg total) by mouth daily. 01/22/21  Yes Myrlene Broker, MD  folic acid (FOLVITE) 1 MG tablet Take 1 mg by mouth every morning.  11/29/19  Yes [provider]  furosemide (LASIX) 40 MG tablet TAKE 40 MG DAILY MAY TAKE ADDITIONAL 20 MG AS NEEDED Patient taking differently: TAKE 40 MG DAILY MAY TAKE ADDITIONAL 20 MG (takes 1.5 tabs) 01/16/21  Yes Branch, Dorothe Pea, MD  gabapentin (NEURONTIN) 400 MG capsule Take 400 mg by mouth 3 (three) times daily.   Yes [provider]  guaiFENesin (MUCINEX) 600 MG 12 hr tablet Take 1 tablet (600 mg total) by mouth 2 (two) times daily as needed for cough or to loosen phlegm. 01/10/20  Yes Shon Hale, MD  Multiple Vitamin (MULTIVITAMIN) tablet Take 1 tablet by mouth daily.   Yes [provider]  OLANZapine (ZYPREXA) 10 MG tablet Take 1 tablet (10 mg total) by mouth 2 (two) times daily. 01/22/21 01/22/22 Yes Myrlene Broker, MD  pantoprazole (PROTONIX) 20 MG tablet Take 20 mg by mouth daily.   Yes [provider]  potassium chloride SA (KLOR-CON) 20 MEQ tablet Take 1 tablet (20 mEq total) by mouth 2 (two) times daily. 03/24/21  Yes Triplett, Tammy, PA-C  propranolol (INDERAL) 10 MG tablet Take 10 mg by mouth 2 (two) times daily. 04/10/21  Yes [provider]  thiamine 100 MG tablet Take 100 mg by mouth daily.   Yes [provider]  traZODone (DESYREL) 50 MG tablet Take 1 tablet (50 mg total) by mouth at bedtime. 01/22/21  Yes Myrlene Broker, MD  vitamin B-12 (CYANOCOBALAMIN) 500 MCG tablet Take 1,000 mcg by mouth every morning.    Yes [provider]  doxycycline (VIBRAMYCIN) 100 MG capsule Take 1 capsule (100 mg total) by mouth 2 (two) times daily. Patient not taking: No sig reported 03/24/21   Triplett, Tammy, PA-C  oxymetazoline (AFRIN) 0.05 % nasal spray Place 1 spray into both nostrils daily. Patient not taking: No sig reported    [provider]  predniSONE (DELTASONE) 10 MG tablet Take by mouth. Patient not taking: No sig reported 03/26/21   [provider]     Cyril Mourning MD. FCCP. [facility] Pager : [phone]  If no response to pager , please call [phone] until 7 pm After 7:00 pm call Elink  [phone]   04/17/2021

## 2021-04-17 NOTE — Progress Notes (Signed)
Patient refusing BiPAP. Haldol given, and patient still refusing BiPAP. I went to see patient at bedside. We discussed worsening blood gas - and that refusing the BiPAP is not compatible with survival. We also clarified that he would like to remain full code. Patient agrees to comply with treatment in the hospital. Of note - even with the worsening blood gas - patient remains alert and oriented.

## 2021-04-18 DIAGNOSIS — I5032 Chronic diastolic (congestive) heart failure: Secondary | ICD-10-CM | POA: Diagnosis not present

## 2021-04-18 DIAGNOSIS — J441 Chronic obstructive pulmonary disease with (acute) exacerbation: Secondary | ICD-10-CM

## 2021-04-18 DIAGNOSIS — F10231 Alcohol dependence with withdrawal delirium: Secondary | ICD-10-CM | POA: Diagnosis not present

## 2021-04-18 DIAGNOSIS — J9622 Acute and chronic respiratory failure with hypercapnia: Secondary | ICD-10-CM | POA: Diagnosis not present

## 2021-04-18 DIAGNOSIS — U071 COVID-19: Principal | ICD-10-CM

## 2021-04-18 DIAGNOSIS — J9602 Acute respiratory failure with hypercapnia: Secondary | ICD-10-CM | POA: Diagnosis not present

## 2021-04-18 LAB — MAGNESIUM: Magnesium: 2.5 mg/dL — ABNORMAL HIGH (ref 1.7–2.4)

## 2021-04-18 MED ORDER — REVEFENACIN 175 MCG/3ML IN SOLN
175.0000 ug | Freq: Every day | RESPIRATORY_TRACT | Status: DC
  Administered 2021-04-22 – 2021-04-24 (×3): 175 ug via RESPIRATORY_TRACT
  Filled 2021-04-18 (×3): qty 3

## 2021-04-18 MED ORDER — PANTOPRAZOLE SODIUM 40 MG IV SOLR
40.0000 mg | INTRAVENOUS | Status: DC
  Administered 2021-04-18 – 2021-04-23 (×6): 40 mg via INTRAVENOUS
  Filled 2021-04-18 (×6): qty 40

## 2021-04-18 MED ORDER — KCL IN DEXTROSE-NACL 20-5-0.9 MEQ/L-%-% IV SOLN: INTRAVENOUS | Status: DC

## 2021-04-18 MED ORDER — ARFORMOTEROL TARTRATE 15 MCG/2ML IN NEBU
15.0000 ug | INHALATION_SOLUTION | Freq: Two times a day (BID) | RESPIRATORY_TRACT | Status: DC
  Administered 2021-04-22 – 2021-04-24 (×5): 15 ug via RESPIRATORY_TRACT
  Filled 2021-04-18 (×5): qty 2

## 2021-04-18 MED ORDER — FUROSEMIDE 10 MG/ML IJ SOLN
20.0000 mg | Freq: Every day | INTRAMUSCULAR | Status: DC
  Administered 2021-04-19 – 2021-04-24 (×6): 20 mg via INTRAVENOUS
  Filled 2021-04-18 (×6): qty 2

## 2021-04-18 MED ORDER — VALPROATE SODIUM 100 MG/ML IV SOLN
500.0000 mg | Freq: Three times a day (TID) | INTRAVENOUS | Status: DC
  Administered 2021-04-18 – 2021-04-24 (×18): 500 mg via INTRAVENOUS
  Filled 2021-04-18 (×22): qty 5

## 2021-04-18 MED ORDER — THIAMINE HCL 100 MG/ML IJ SOLN
100.0000 mg | Freq: Every day | INTRAMUSCULAR | Status: DC
  Administered 2021-04-18 – 2021-04-23 (×6): 100 mg via INTRAVENOUS
  Filled 2021-04-18 (×6): qty 2

## 2021-04-18 MED ORDER — ALBUTEROL SULFATE (2.5 MG/3ML) 0.083% IN NEBU: 2.5000 mg | INHALATION_SOLUTION | RESPIRATORY_TRACT | Status: DC | PRN

## 2021-04-18 MED ORDER — BUDESONIDE 0.25 MG/2ML IN SUSP
0.2500 mg | Freq: Two times a day (BID) | RESPIRATORY_TRACT | Status: DC
  Administered 2021-04-22 – 2021-04-24 (×5): 0.25 mg via RESPIRATORY_TRACT
  Filled 2021-04-18 (×5): qty 2

## 2021-04-18 MED ORDER — FOLIC ACID 5 MG/ML IJ SOLN
1.0000 mg | Freq: Every day | INTRAMUSCULAR | Status: DC
  Administered 2021-04-18 – 2021-04-24 (×6): 1 mg via INTRAVENOUS
  Filled 2021-04-18 (×6): qty 0.2

## 2021-04-18 NOTE — Progress Notes (Signed)
PROGRESS NOTE    Jeffrey [name]  SAY:[id number] DOB: 1961/09/26 DOA: 05-11-2021 PCP: Gareth Morgan, MD   Chief Complaint  Patient presents with  . Shortness of Breath    Brief admission Narrative:  As per H&P written by Dr. Devoria Albe Jeffrey [name]  is a 60 y.o. male, with medical history significant forhypertension, hyperlipidemia, COPD(on 3-4 LPM of supplemental oxygen via [place] at baseline), CHF, GERD, as well on home BiPAP machine, with very poor compliance, oriented, who presents to ED secondary to shortness of breath, reports he started to feel worsening shortness of breath today, where he had to call EMS, as his home nebulizer machine has not been helping, he was given albuterol, Atrovent, as well Solu-Medrol, he reports improvement of his symptoms after EMS treatment, he denies any fever, chills, nausea, vomiting, no abdominal pain, no COVID exposures, no chest pain, no leg swelling, reports he received his COVID-vaccine and booster. -in ED patient is mildly confused, but awake, ABG showing PCO2 retention of 112, pH of 7.3, his COVID-19 test was positive, his chest x-ray chronic some chronic interstitial changes and COPD, with possible superimposed atypical infection, [facility] consulted to admit .  Assessment & Plan: 1-acute on chronic hypoxic and hypercarbic respiratory failure secondary to COPD exacerbation and COVID-19 pneumonia -Chest x-ray is also raising concern for component of bronchiectasis/bacterial infection; will continue doxycycline  -Competed treatment with remdesivir and continue receiving steroids -Continue bronchodilators/nebulizer management as per PCCM recommendations.  -continue to Wean oxygen supplementation down to baseline and continue as needed use of BiPAP. -Appreciate assistance and recommendation by PCCM and will follow instructions.  2-alcohol abuse with withdrawal symptoms -Patient has been started on Precedex; will attempt for RASS of -1;  currently too sedated.  -Follow clinical response and CIWA score -Thiamine and folic acid has been added.  3-Cigarette smoker -Cessation counseling provided. -will add nicotine patch.  4-Chronic diastolic CHF (congestive heart failure) (HCC) -appears Compensated currently -Continue home dose of Lasix, IV now while fully awake to take PO's. -continue Daily weights-strict I's and O's. -Low-sodium diet has been ordered and will be continue.  5-Essential hypertension -Stable and well-controlled -Continue current antihypertensive regimen.  6-hypokalemia -will follow electrolytes and replete as needed.   DVT prophylaxis: Lovenox  Code Status: Full code. Family Communication: No family at bedside. Disposition:   Status is: Inpatient   Dispo: The patient is from: Home              Anticipated d/c is to: Home              Patient currently not medically stable for discharge; intermittently confused, agitated and with elevated CIWA; has been started on Precedex and is actively receiving treatment for acute COVID infection along with COPD exacerbation and concern for bacterial pneumonia.   Difficult to place patient No    Consultants:   PCCM.   Procedures:  See below for x-ray reports.   Antimicrobials:  Doxycycline   Subjective: No fever, good oxygen saturation on nasal cannula supplementation; no chest pain, no nausea vomiting.  Patient remains on Precedex currently with a RASS of -2, intermittently tremulous and agitated per nursing report.  Objective: Vitals:   04/18/21 0600 04/18/21 0700 04/18/21 0811 04/18/21 1200  BP: 118/62 118/61    Pulse: (!) 56 (!) 59 (!) 53   Resp: 16 19 17    Temp:   97.6 F (36.4 C) 98 F (36.7 C)  TempSrc:   Axillary   SpO2: 100% 100%  100%   Weight:      Height:        Intake/Output Summary (Last 24 hours) at 04/18/2021 1755 Last data filed at 04/18/2021 1311 Gross per 24 hour  Intake 605.02 ml  Output 600 ml  Net 5.02 ml    Filed Weights   04/07/2021 1751  Weight: 83.9 kg    Examination: General exam: Currently sedated, intermittently tremulous; per nursing staff agitated and requiring Precedex.  No nausea, no vomiting, good saturation on current oxygen supplementation. Respiratory system: Decreased breath sounds, no wheezing on examination.  No tachypnea.  Positive scattered rhonchi. Cardiovascular system: Regular rate and rhythm; no murmurs, no rubs, no gallops. Gastrointestinal system: Abdomen is nondistended, soft and nontender. No organomegaly or masses felt. Normal bowel sounds heard. Central nervous system: Unable to properly assess secondary to current level of sedation. Extremities: No cyanosis or clubbing. Skin: No petechiae. Psychiatry: Unable to properly assess with current level of sedation.  Data Reviewed: I have personally reviewed following labs and imaging studies  CBC: Recent Labs  Lab 04/11/2021 1838 04/17/21 0410  WBC 9.3 5.1  HGB 13.3 12.2*  HCT 41.1 38.1*  MCV 92.8 91.4  PLT 258 250    Basic Metabolic Panel: Recent Labs  Lab 04/08/2021 1838 04/17/21 0410 04/18/21 0407  NA 137 135  --   K 4.1 3.3*  --   CL 78* 78*  --   CO2 47* 43*  --   GLUCOSE 130* 127*  --   BUN 12 15  --   CREATININE 0.55* 0.57*  --   CALCIUM 9.1 9.2  --   MG  --   --  2.5*    GFR: Estimated Creatinine Clearance: 101.4 mL/min (A) (by C-G formula based on SCr of 0.57 mg/dL (L)).  Liver Function Tests: Recent Labs  Lab 04/17/21 0410  AST 26  ALT 23  ALKPHOS 93  BILITOT 0.6  PROT 6.9  ALBUMIN 3.3*    Recent Results (from the past 240 hour(s))  Resp Panel by RT-PCR (Flu A&B, Covid) Nasopharyngeal Swab     Status: Abnormal   Collection Time: 03/25/2021  6:13 PM   Specimen: Nasopharyngeal Swab; Nasopharyngeal(NP) swabs in vial transport medium  Result Value Ref Range Status   SARS Coronavirus 2 by RT PCR POSITIVE (A) NEGATIVE Final    Comment: RESULT CALLED TO, READ BACK BY AND VERIFIED  WITH: K BELTON,RN@[phone]  04/10/2021 MKELLY (NOTE) SARS-CoV-2 target nucleic acids are DETECTED.  The SARS-CoV-2 RNA is generally detectable in upper respiratory specimens during the acute phase of infection. Positive results are indicative of the presence of the identified virus, but do not rule out bacterial infection or co-infection with other pathogens not detected by the test. Clinical correlation with patient history and other diagnostic information is necessary to determine patient infection status. The expected result is Negative.  Fact Sheet for Patients: BloggerCourse.comhttps://www.fda.gov/media/152166/download  Fact Sheet for Healthcare Providers: SeriousBroker.ithttps://www.fda.gov/media/152162/download  This test is not yet approved or cleared by the [place] FDA and  has been authorized for detection and/or diagnosis of SARS-CoV-2 by FDA under an Emergency Use Authorization (EUA).  This EUA will remain in effect (meaning this test can be  used) for the duration of  the COVID-19 declaration under Section 564(b)(1) of the Act, 21 U.S.C. section 360bbb-3(b)(1), unless the authorization is terminated or revoked sooner.     Influenza A by PCR NEGATIVE NEGATIVE Final   Influenza B by PCR NEGATIVE NEGATIVE Final    Comment: (NOTE)  The Xpert Xpress SARS-CoV-2/FLU/RSV plus assay is intended as an aid in the diagnosis of influenza from Nasopharyngeal swab specimens and should not be used as a sole basis for treatment. Nasal washings and aspirates are unacceptable for Xpert Xpress SARS-CoV-2/FLU/RSV testing.  Fact Sheet for Patients: BloggerCourse.com  Fact Sheet for Healthcare Providers: SeriousBroker.it  This test is not yet approved or cleared by the [place] FDA and has been authorized for detection and/or diagnosis of SARS-CoV-2 by FDA under an Emergency Use Authorization (EUA). This EUA will remain in effect (meaning this test can be used)  for the duration of the COVID-19 declaration under Section 564(b)(1) of the Act, 21 U.S.C. section 360bbb-3(b)(1), unless the authorization is terminated or revoked.  Performed at [facility], [address], [place], [place] [address]   MRSA PCR Screening     Status: None   Collection Time: 04/17/21 12:46 AM   Specimen: Nasal Mucosa; Nasopharyngeal  Result Value Ref Range Status   MRSA by PCR NEGATIVE NEGATIVE Final    Comment:        The GeneXpert MRSA Assay (FDA approved for NASAL specimens only), is one component of a comprehensive MRSA colonization surveillance program. It is not intended to diagnose MRSA infection nor to guide or monitor treatment for MRSA infections. Performed at [facility], [address] [place], [place] [address]      Radiology Studies: DG Chest [facility] 1 View  Result Date: 03/30/2021 CLINICAL DATA:  Dyspnea, shortness of breath, diminished lung sounds EXAM: PORTABLE CHEST 1 VIEW COMPARISON:  CT 03/24/2021, radiograph 03/24/2021 FINDINGS: Coarsened reticulonodular opacities and bronchitic change with a basilar predominance, similar to comparison exam. No new consolidative opacity is seen. No pneumothorax or effusion. The aorta is calcified. The remaining cardiomediastinal contours are unremarkable. No acute osseous or soft tissue abnormality. Telemetry leads overlie the chest. IMPRESSION: Coarsened reticulonodular and bronchitic changes, most pronounced in the mid to lower lungs much of which could reflect some chronic interstitial changes and COPD though a superimposed atypical infectious or inflammatory process is not entirely excluded. Redemonstration of a pulmonary nodule in the mid left lung. Linear opacity in the left lung base, possible scarring or atelectasis. Aortic Atherosclerosis (ICD10-I70.0). Electronically Signed   By: Kreg Shropshire M.D.   On: 04/17/2021 19:20   Scheduled Meds: . arformoterol  15 mcg Nebulization BID  . bisoprolol  5 mg Oral  Daily  . budesonide (PULMICORT) nebulizer solution  0.25 mg Nebulization BID  . Chlorhexidine Gluconate Cloth  6 each Topical Daily  . enoxaparin (LOVENOX) injection  40 mg Subcutaneous Q24H  . folic acid  1 mg Intravenous Daily  . methylPREDNISolone (SOLU-MEDROL) injection  60 mg Intravenous Q8H  . pantoprazole (PROTONIX) IV  40 mg Intravenous Q24H  . revefenacin  175 mcg Nebulization Daily  . thiamine injection  100 mg Intravenous Daily   Continuous Infusions: . dexmedetomidine (PRECEDEX) IV infusion 0.6 mcg/kg/hr (04/18/21 1736)  . dextrose 5 % and 0.9 % NaCl with KCl 20 mEq/L 50 mL/hr at 04/18/21 1744  . doxycycline (VIBRAMYCIN) IV 100 mg (04/18/21 1241)  . remdesivir 100 mg in NS 100 mL 100 mg (04/18/21 1416)  . valproate sodium 500 mg (04/18/21 1748)     LOS: 2 days    Time spent: 35 minutes   Vassie Loll, MD [facility]   To contact the attending provider between 7A-7P or the covering provider during after hours 7P-7A, please log into the web site [email or web address] and access using universal [facility] password for that web site. If you do not have the password, please call the hospital operator.  04/18/2021, 5:55 PM

## 2021-04-18 NOTE — Progress Notes (Signed)
NAME:  Jeffrey [name], MRN:  [id number], DOB:  1961-08-15, LOS: 2 ADMISSION DATE:  03/23/2021, CONSULTATION DATE:  04/18/2021  REFERRING MD:  Gwenlyn Perking, TRH, CHIEF COMPLAINT: Respiratory distress on BiPAP  History of Present Illness:  60 year old ex-smoker with known COPD and chronic hypoxic respiratory failure on 3 L oxygen, patient of Dr. Sherene Sires admitted with shortness of breath ongoing for a few days not relieved with short acting bronchodilators at home.  He reports nonproductive cough.  In the ED he was confused, ABG showed 7.30/112, COVID testing was positive. He is vaccinated and boosted, it was felt that he was more likely having a COPD exacerbation rather than infective COVID-pneumonia, he was placed on BiPAP, started on remdesivir and steroids. 5/26 increased agitation, not keeping BiPAP on, RN concerned about EtOH withdrawal, PCCM consulted emergently  Pertinent  Medical History  Chronic diastolic heart failure. 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 5/26 increased agitation , not tolerating BiPAP . 5/27 remains on precedex  Interim History / Subjective:  Pt unable to provide history.  Objective   Blood pressure 118/61, pulse (!) 53, temperature 98 F (36.7 C), resp. rate 17, height 5\' 10"  (1.778 m), weight 83.9 kg, SpO2 100 %.        Intake/Output Summary (Last 24 hours) at 04/18/2021 1548 Last data filed at 04/18/2021 1311 Gross per 24 hour  Intake 605.02 ml  Output 600 ml  Net 5.02 ml   Filed Weights   03/23/2021 1751  Weight: 83.9 kg    Examination:  General - sedated Eyes - pupils  reactive ENT - no sinus tenderness, no stridor Cardiac - regular rate/rhythm, no murmur Chest - decreased breath sounds, no wheeze Abdomen - soft, non tender, + bowel sounds Extremities - decreased muscle bulk Skin - no rashes Neuro - RASS -2, tremulous  Resolved Hospital Problem list     Assessment & Plan:   Delirium tremens from alcohol withdrawal. Hx of bipolar disease. - continue precedex for RASS goal 0 to -1 - prn haldol, ativan - valproic acid 500 mg IV q8h until able to swallow pills - hold outpt prozac, neurontin, zyprexa, trazodone, B12 until able to swallow pills  Acute on chronic hypoxic, hypercapnic respiratory failure. COPD exacerbation. - goal SpO2 88 to 95% - f/u CXR intermittently - mental status precludes 04/18/21 of Bipap at this time; if respiratory status gets worse, then he would need intubation - day 2 of doxycycline - continue solumedrol - change BDs to pulmicort, brovana, yupelri with prn albuterol  COVID 19 positive. - difficult to determine if this is coincidental finding or true infection - treated with remdesivir, and steroids  Hx of HTN, [name]. - hold outpt aspirin, lipitor, zebeta, cardizem, lasix until he is able to swallow pills  Hypokalemia. - f/u BMET  Best practice (right click and "Reselect all SmartList Selections" daily)  Diet:  NPO Pain/Anxiety/Delirium protocol (if indicated): Yes (RASS goal 0) VAP protocol (if indicated): Not indicated DVT prophylaxis: LMWH GI prophylaxis: N/A and PPI Glucose control:  SSI No Central venous access:  N/A Arterial line:  N/A Foley:  N/A Mobility:  bed rest  PT consulted: N/A Last date  of multidisciplinary goals of care discussion [per TRH] Code Status:  full code Disposition: ICU  Labs    CMP Latest Ref Rng & Units 04/17/2021 04/09/2021 03/24/2021  Glucose 70 - 99 mg/dL 235(T) 732(K) 79  BUN 6 - 20 mg/dL 15 12 12   Creatinine 0.61 - 1.24 mg/dL ) 0.25(K) 2.70(W  Sodium 135 - 145 mmol/L 135  137 135  Potassium 3.5 - 5.1 mmol/L 3.3(L) 4.1 3.1(L)  Chloride 98 - 111 mmol/L 78(L) 78(L) 81(L)  CO2 22 - 32 mmol/L 43(H) 47(H) 45(H)  Calcium 8.9 - 10.3 mg/dL 9.2 9.1 9.4  Total Protein 6.5 - 8.1 g/dL 6.9 - -  Total Bilirubin 0.3 - 1.2 mg/dL 0.6 - -  Alkaline Phos 38 - 126 U/L 93 - -  AST 15 - 41 U/L 26 - -  ALT 0 - 44 U/L 23 - -    CBC Latest Ref Rng & Units 04/17/2021 04/13/2021 03/24/2021  WBC 4.0 - 10.5 K/uL 5.1 9.3 6.6  Hemoglobin 13.0 - 17.0 g/dL 12.2(L) 13.3 11.9(L)  Hematocrit 39.0 - 52.0 % 38.1(L) 41.1 37.3(L)  Platelets 150 - 400 K/uL 250 258 275    ABG    Component Value Date/Time   PHART 7.428 04/17/2021 0815   PCO2ART 75.1 (HH) 04/17/2021 0815   PO2ART 95.8 04/17/2021 0815   HCO3 44.8 (H) 04/17/2021 0815   TCO2 34 (H) 12/21/2018 0357   ACIDBASEDEF 0.6 02/17/2019 1850   O2SAT 97.6 04/17/2021 0815    CBG (last 3)  No results for input(s): GLUCAP in the last 72 hours.   Critical care time: 38 minutes  04/19/2021, MD [facility] Pulmonary/Critical Care Pager - [phone] 04/18/2021, 3:48 PM

## 2021-04-18 NOTE — Care Management Important Message (Signed)
Important Message  Patient Details  Name: Jeffrey [name] MRN: [id number] Date of Birth: 1961-03-30   Medicare Important Message Given:  Yes     Corey Harold 04/18/2021, 3:00 PM

## 2021-04-19 DIAGNOSIS — I5032 Chronic diastolic (congestive) heart failure: Secondary | ICD-10-CM | POA: Diagnosis not present

## 2021-04-19 DIAGNOSIS — J9622 Acute and chronic respiratory failure with hypercapnia: Secondary | ICD-10-CM | POA: Diagnosis not present

## 2021-04-19 DIAGNOSIS — J441 Chronic obstructive pulmonary disease with (acute) exacerbation: Secondary | ICD-10-CM | POA: Diagnosis not present

## 2021-04-19 DIAGNOSIS — J9602 Acute respiratory failure with hypercapnia: Secondary | ICD-10-CM | POA: Diagnosis not present

## 2021-04-19 MED ORDER — LORAZEPAM 2 MG/ML IJ SOLN
1.0000 mg | Freq: Four times a day (QID) | INTRAMUSCULAR | Status: DC | PRN
  Administered 2021-04-20 – 2021-04-24 (×7): 2 mg via INTRAVENOUS
  Filled 2021-04-19 (×8): qty 1

## 2021-04-19 MED ORDER — VALPROATE SODIUM 500 MG/5ML IV SOLN
INTRAVENOUS | Status: AC
  Filled 2021-04-19: qty 5

## 2021-04-19 MED ORDER — NICOTINE 21 MG/24HR TD PT24
21.0000 mg | MEDICATED_PATCH | Freq: Every day | TRANSDERMAL | Status: DC
  Administered 2021-04-21 – 2021-04-24 (×4): 21 mg via TRANSDERMAL
  Filled 2021-04-19 (×4): qty 1

## 2021-04-19 NOTE — Progress Notes (Signed)
PROGRESS NOTE    Jeffrey [name]  TIW:[id number] DOB: 1961/06/15 DOA: 05-12-2021 PCP: Gareth Morgan, MD   Chief Complaint  Patient presents with  . Shortness of Breath    Brief admission Narrative:  As per H&P written by Dr. Devoria Albe [name]  is a 60 y.o. male, with medical history significant forhypertension, hyperlipidemia, COPD(on 3-4 LPM of supplemental oxygen via [place] at baseline), CHF, GERD, as well on home BiPAP machine, with very poor compliance, oriented, who presents to ED secondary to shortness of breath, reports he started to feel worsening shortness of breath today, where he had to call EMS, as his home nebulizer machine has not been helping, he was given albuterol, Atrovent, as well Solu-Medrol, he reports improvement of his symptoms after EMS treatment, he denies any fever, chills, nausea, vomiting, no abdominal pain, no COVID exposures, no chest pain, no leg swelling, reports he received his COVID-vaccine and booster. -in ED patient is mildly confused, but awake, ABG showing PCO2 retention of 112, pH of 7.3, his COVID-19 test was positive, his chest x-ray chronic some chronic interstitial changes and COPD, with possible superimposed atypical infection, [facility]ist consulted to admit .  Assessment & Plan: 1-acute on chronic hypoxic and hypercarbic respiratory failure secondary to COPD exacerbation and COVID-19 pneumonia -Chest x-ray is also raising concern for component of bronchiectasis/bacterial infection; will continue doxycycline  -Competed treatment with remdesivir and continue receiving steroids -Continue bronchodilators/nebulizer management as per PCCM recommendations.  -continue to Wean oxygen supplementation down to baseline and continue as needed use of BiPAP. -Appreciate assistance and recommendation by PCCM and will follow instructions.  2-alcohol abuse with withdrawal symptoms -Continue to wean off Precedex as safely tolerated. -Follow clinical  response and CIWA score -Thiamine and folic acid has been added.  3-Cigarette smoker -Cessation counseling provided. -will continue nicotine patch.  4-Chronic diastolic CHF (congestive heart failure) (HCC) -appears Compensated currently -Continue home dose of Lasix, IV now while fully awake to take PO's. -continue Daily weights-strict I's and O's. -Low-sodium diet has been ordered and will be continue.  5-Essential hypertension -Stable and well-controlled -Continue current antihypertensive regimen.  6-hypokalemia -will follow electrolytes and replete as needed.   DVT prophylaxis: Lovenox  Code Status: Full code. Family Communication: No family at bedside. Disposition:   Status is: Inpatient   Dispo: The patient is from: Home              Anticipated d/c is to: Home              Patient currently not medically stable for discharge; intermittently confused, agitated and with elevated CIWA; has been started on Precedex and is actively receiving treatment for acute COVID infection along with COPD exacerbation and concern for bacterial pneumonia.   Difficult to place patient No    Consultants:   PCCM.   Procedures:  See below for x-ray reports.   Antimicrobials:  Doxycycline   Subjective: No fever, good saturation on chronic nasal cannula supplementation and demonstrating no wheezing on auscultation.  Intermittently still demonstrating episodes of agitation.  On my exam too sedated and with mittens in place.  Objective: Vitals:   04/19/21 1100 04/19/21 1200 04/19/21 1300 04/19/21 1400  BP: (!) 147/74 136/63 137/69 134/68  Pulse: 65 (!) 56 61 (!) 53  Resp: 17 (!) 21 19 18   Temp:      TempSrc:      SpO2: 100% 100% 100% 100%  Weight:      Height:  Intake/Output Summary (Last 24 hours) at 04/19/2021 1734 Last data filed at 04/19/2021 1528 Gross per 24 hour  Intake 1897.52 ml  Output 500 ml  Net 1397.52 ml   Filed Weights   04/27/2021 1751  Weight: 83.9  kg    Examination: General exam: No overnight events reported; sedated with good oxygen saturation on chronic supplementation and in no distress.  Earlier today became agitated to the point of requiring bilateral mittens and increase in Precedex dose. Respiratory system: Positive scattered rhonchi; decreased breath sounds at the bases.  No frank crackles.  No using accessory muscle. Cardiovascular system:RRR. No murmurs, rubs or gallops. Gastrointestinal system: Abdomen is obese, nondistended, soft and nontender. No organomegaly or masses felt. Normal bowel sounds heard. Central nervous system: Unable to properly assess secondary to current level of sedation. Extremities: No cyanosis or clubbing. Skin: No petechiae. Psychiatry: Unable to properly assess with current level of sedation.  Data Reviewed: I have personally reviewed following labs and imaging studies  CBC: Recent Labs  Lab 27-Apr-2021 1838 04/17/21 0410  WBC 9.3 5.1  HGB 13.3 12.2*  HCT 41.1 38.1*  MCV 92.8 91.4  PLT 258 250    Basic Metabolic Panel: Recent Labs  Lab 2021/04/27 1838 04/17/21 0410 04/18/21 0407  NA 137 135  --   K 4.1 3.3*  --   CL 78* 78*  --   CO2 47* 43*  --   GLUCOSE 130* 127*  --   BUN 12 15  --   CREATININE 0.55* 0.57*  --   CALCIUM 9.1 9.2  --   MG  --   --  2.5*    GFR: Estimated Creatinine Clearance: 101.4 mL/min (A) (by C-G formula based on SCr of 0.57 mg/dL (L)).  Liver Function Tests: Recent Labs  Lab 04/17/21 0410  AST 26  ALT 23  ALKPHOS 93  BILITOT 0.6  PROT 6.9  ALBUMIN 3.3*    Recent Results (from the past 240 hour(s))  Resp Panel by RT-PCR (Flu A&B, Covid) Nasopharyngeal Swab     Status: Abnormal   Collection Time: 04/27/2021  6:13 PM   Specimen: Nasopharyngeal Swab; Nasopharyngeal(NP) swabs in vial transport medium  Result Value Ref Range Status   SARS Coronavirus 2 by RT PCR POSITIVE (A) NEGATIVE Final    Comment: RESULT CALLED TO, READ BACK BY AND VERIFIED  WITH: K BELTON,RN@[id number]  April 27, 2021 MKELLY (NOTE) SARS-CoV-2 target nucleic acids are DETECTED.  The SARS-CoV-2 RNA is generally detectable in upper respiratory specimens during the acute phase of infection. Positive results are indicative of the presence of the identified virus, but do not rule out bacterial infection or co-infection with other pathogens not detected by the test. Clinical correlation with patient history and other diagnostic information is necessary to determine patient infection status. The expected result is Negative.  Fact Sheet for Patients: BloggerCourse.com  Fact Sheet for Healthcare Providers: SeriousBroker.it  This test is not yet approved or cleared by the [place] FDA and  has been authorized for detection and/or diagnosis of SARS-CoV-2 by FDA under an Emergency Use Authorization (EUA).  This EUA will remain in effect (meaning this test can be  used) for the duration of  the COVID-19 declaration under Section 564(b)(1) of the Act, 21 U.S.C. section 360bbb-3(b)(1), unless the authorization is terminated or revoked sooner.     Influenza A by PCR NEGATIVE NEGATIVE Final   Influenza B by PCR NEGATIVE NEGATIVE Final    Comment: (NOTE) The Xpert Xpress SARS-CoV-2/FLU/RSV plus assay  is intended as an aid in the diagnosis of influenza from Nasopharyngeal swab specimens and should not be used as a sole basis for treatment. Nasal washings and aspirates are unacceptable for Xpert Xpress SARS-CoV-2/FLU/RSV testing.  Fact Sheet for Patients: BloggerCourse.com  Fact Sheet for Healthcare Providers: SeriousBroker.it  This test is not yet approved or cleared by the [place] FDA and has been authorized for detection and/or diagnosis of SARS-CoV-2 by FDA under an Emergency Use Authorization (EUA). This EUA will remain in effect (meaning this test can be used)  for the duration of the COVID-19 declaration under Section 564(b)(1) of the Act, 21 U.S.C. section 360bbb-3(b)(1), unless the authorization is terminated or revoked.  Performed at [facility], [address] [place], [place] [address]   MRSA PCR Screening     Status: None   Collection Time: 04/17/21 12:46 AM   Specimen: Nasal Mucosa; Nasopharyngeal  Result Value Ref Range Status   MRSA by PCR NEGATIVE NEGATIVE Final    Comment:        The GeneXpert MRSA Assay (FDA approved for NASAL specimens only), is one component of a comprehensive MRSA colonization surveillance program. It is not intended to diagnose MRSA infection nor to guide or monitor treatment for MRSA infections. Performed at [facility], [address] [place], [place] [address]      Radiology Studies: No results found. Scheduled Meds: . arformoterol  15 mcg Nebulization BID  . bisoprolol  5 mg Oral Daily  . budesonide (PULMICORT) nebulizer solution  0.25 mg Nebulization BID  . Chlorhexidine Gluconate Cloth  6 each Topical Daily  . enoxaparin (LOVENOX) injection  40 mg Subcutaneous Q24H  . folic acid  1 mg Intravenous Daily  . furosemide  20 mg Intravenous Daily  . methylPREDNISolone (SOLU-MEDROL) injection  60 mg Intravenous Q8H  . pantoprazole (PROTONIX) IV  40 mg Intravenous Q24H  . revefenacin  175 mcg Nebulization Daily  . thiamine injection  100 mg Intravenous Daily   Continuous Infusions: . dexmedetomidine (PRECEDEX) IV infusion 0.8 mcg/kg/hr (04/19/21 1528)  . dextrose 5 % and 0.9 % NaCl with KCl 20 mEq/L 50 mL/hr at 04/19/21 1528  . doxycycline (VIBRAMYCIN) IV 125 mL/hr at 04/19/21 1239  . valproate sodium Stopped (04/19/21 1508)     LOS: 3 days    Time spent: 35 minutes   Vassie Loll, MD [facility]   To contact the attending provider between 7A-7P or the covering provider during after hours 7P-7A, please log into the web site [email or web address] and access using universal [facility] password for that web site. If you do not have the password, please call the hospital operator.  04/19/2021, 5:34 PM

## 2021-04-20 DIAGNOSIS — J441 Chronic obstructive pulmonary disease with (acute) exacerbation: Secondary | ICD-10-CM | POA: Diagnosis not present

## 2021-04-20 DIAGNOSIS — I5032 Chronic diastolic (congestive) heart failure: Secondary | ICD-10-CM | POA: Diagnosis not present

## 2021-04-20 DIAGNOSIS — J9602 Acute respiratory failure with hypercapnia: Secondary | ICD-10-CM | POA: Diagnosis not present

## 2021-04-20 DIAGNOSIS — J9622 Acute and chronic respiratory failure with hypercapnia: Secondary | ICD-10-CM | POA: Diagnosis not present

## 2021-04-20 LAB — CBC
HCT: 39.2 % (ref 39.0–52.0)
Hemoglobin: 12.3 g/dL — ABNORMAL LOW (ref 13.0–17.0)
MCH: 29.4 pg (ref 26.0–34.0)
MCHC: 31.4 g/dL (ref 30.0–36.0)
MCV: 93.8 fL (ref 80.0–100.0)
Platelets: 195 10*3/uL (ref 150–400)
RBC: 4.18 MIL/uL — ABNORMAL LOW (ref 4.22–5.81)
RDW: 11.8 % (ref 11.5–15.5)
WBC: 5.7 10*3/uL (ref 4.0–10.5)
nRBC: 0 % (ref 0.0–0.2)

## 2021-04-20 LAB — BASIC METABOLIC PANEL
Anion gap: 7 (ref 5–15)
BUN: 26 mg/dL — ABNORMAL HIGH (ref 6–20)
CO2: 39 mmol/L — ABNORMAL HIGH (ref 22–32)
Calcium: 8.5 mg/dL — ABNORMAL LOW (ref 8.9–10.3)
Chloride: 94 mmol/L — ABNORMAL LOW (ref 98–111)
Creatinine, Ser: 0.42 mg/dL — ABNORMAL LOW (ref 0.61–1.24)
GFR, Estimated: >60 mL/min (ref >60)
Glucose, Bld: 147 mg/dL — ABNORMAL HIGH (ref 70–99)
Potassium: 4.2 mmol/L (ref 3.5–5.1)
Sodium: 140 mmol/L (ref 135–145)

## 2021-04-20 NOTE — Progress Notes (Signed)
PROGRESS NOTE    [name]  Jeffrey [name]:[id number] DOB: [birth year] DOA: May 02, 2021 PCP: Gareth Morgan, MD   Chief Complaint  Patient presents with  . Shortness of Breath    Brief admission Narrative:  As per H&P written by Dr. Devoria Albe Jeffrey [name]  is a 60 y.o. male, with medical history significant forhypertension, hyperlipidemia, COPD(on 3-4 LPM of supplemental oxygen via [place] at baseline), CHF, GERD, as well on home BiPAP machine, with very poor compliance, oriented, who presents to ED secondary to shortness of breath, reports he started to feel worsening shortness of breath today, where he had to call EMS, as his home nebulizer machine has not been helping, he was given albuterol, Atrovent, as well Solu-Medrol, he reports improvement of his symptoms after EMS treatment, he denies any fever, chills, nausea, vomiting, no abdominal pain, no COVID exposures, no chest pain, no leg swelling, reports he received his COVID-vaccine and booster. -in ED patient is mildly confused, but awake, ABG showing PCO2 retention of 112, pH of 7.3, his COVID-19 test was positive, his chest x-ray chronic some chronic interstitial changes and COPD, with possible superimposed atypical infection, [facility] hospitalist consulted to admit .  Assessment & Plan: 1-acute on chronic hypoxic and hypercarbic respiratory failure secondary to COPD exacerbation and COVID-19 pneumonia -Chest x-ray is also raising concern for component of bronchiectasis/bacterial infection; will continue doxycycline  -Competed treatment with remdesivir and continue receiving steroids -Continue bronchodilators/nebulizer management as per PCCM recommendations.  -continue to Wean oxygen supplementation down to baseline and continue as needed use of BiPAP. -Appreciate assistance and recommendation by PCCM and will follow instructions.  2-alcohol abuse with withdrawal symptoms -Continue to wean off Precedex as safely tolerated. -Follow clinical  response and CIWA score -Thiamine and folic acid has been added.  3-Cigarette smoker -Cessation counseling provided. -will continue nicotine patch.  4-Chronic diastolic CHF (congestive heart failure) (HCC) -appears Compensated currently -Continue home dose of Lasix, IV now while fully awake to take PO's. -continue Daily weights-strict I's and O's. -Low-sodium diet has been ordered and will be continue.  5-Essential hypertension -Stable and well-controlled currently -Continue current antihypertensive regimen.  6-hypokalemia -will follow electrolytes and replete as needed.   DVT prophylaxis: Lovenox  Code Status: Full code. Family Communication: No family at bedside. Disposition:   Status is: Inpatient   Dispo: The patient is from: Home              Anticipated d/c is to: Home              Patient currently not medically stable for discharge; intermittently confused, agitated and with elevated CIWA; has been started on Precedex and is actively receiving treatment for acute COVID infection along with COPD exacerbation and concern for bacterial pneumonia.   Difficult to place patient No    Consultants:   PCCM.   Procedures:  See below for x-ray reports.   Antimicrobials:  Doxycycline   Subjective: Sedated, moving 4 limbs spontaneously; no fever, no chest pain, no nausea vomiting.  Nursing staff reported intermittent episodes of agitation and restlessness.  Still requiring Precedex drip.  Objective: Vitals:   04/20/21 1300 04/20/21 1345 04/20/21 1400 04/20/21 1600  BP: (!) 107/58  (!) 100/51 136/90  Pulse: 69  66 (!) 116  Resp: 15  17 18   Temp:  98.4 F (36.9 C)  98.8 F (37.1 C)  TempSrc:  Axillary  Oral  SpO2: 100%  100% 94%  Weight:      Height:  Intake/Output Summary (Last 24 hours) at 04/20/2021 1646 Last data filed at 04/20/2021 1600 Gross per 24 hour  Intake 1778.07 ml  Output 1900 ml  Net -121.93 ml   Filed Weights   04/11/2021 1751   Weight: 83.9 kg    Examination: General exam: Continue to experience intermittent episode of agitation and restlessness.  No nausea, no vomiting, no chest pain, no fever.  Still receiving Precedex drip (but requiring less amount). Respiratory system: Scattered rhonchi, mild expiratory wheezing.  No using accessory muscle.  No crackles. Cardiovascular system: Rate controlled, no rubs, no gallops, no JVD on exam. Gastrointestinal system: Abdomen is nondistended, soft and nontender. No organomegaly or masses felt. Normal bowel sounds heard. Central nervous system: Unable to properly assess with current mentation; moving 4 limbs spontaneously. Extremities: No cyanosis or clubbing. Skin: No petechiae. Psychiatry: Unable to fully assess with current mentation.   Data Reviewed: I have personally reviewed following labs and imaging studies  CBC: Recent Labs  Lab 04/01/2021 1838 04/17/21 0410 04/20/21 0744  WBC 9.3 5.1 5.7  HGB 13.3 12.2* 12.3*  HCT 41.1 38.1* 39.2  MCV 92.8 91.4 93.8  PLT 258 250 195    Basic Metabolic Panel: Recent Labs  Lab 04/21/2021 1838 04/17/21 0410 04/18/21 0407 04/20/21 0528  NA 137 135  --  140  K 4.1 3.3*  --  4.2  CL 78* 78*  --  94*  CO2 47* 43*  --  39*  GLUCOSE 130* 127*  --  147*  BUN 12 15  --  26*  CREATININE 0.55* 0.57*  --  0.42*  CALCIUM 9.1 9.2  --  8.5*  MG  --   --  2.5*  --     GFR: Estimated Creatinine Clearance: 101.4 mL/min (A) (by C-G formula based on SCr of 0.42 mg/dL (L)).  Liver Function Tests: Recent Labs  Lab 04/17/21 0410  AST 26  ALT 23  ALKPHOS 93  BILITOT 0.6  PROT 6.9  ALBUMIN 3.3*    Recent Results (from the past 240 hour(s))  Resp Panel by RT-PCR (Flu A&B, Covid) Nasopharyngeal Swab     Status: Abnormal   Collection Time: 04/05/2021  6:13 PM   Specimen: Nasopharyngeal Swab; Nasopharyngeal(NP) swabs in vial transport medium  Result Value Ref Range Status   SARS Coronavirus 2 by RT PCR POSITIVE (A) NEGATIVE  Final    Comment: RESULT CALLED TO, READ BACK BY AND VERIFIED WITH: K BELTON,RN@2133  04/15/2021 MKELLY (NOTE) SARS-CoV-2 target nucleic acids are DETECTED.  The SARS-CoV-2 RNA is generally detectable in upper respiratory specimens during the acute phase of infection. Positive results are indicative of the presence of the identified virus, but do not rule out bacterial infection or co-infection with other pathogens not detected by the test. Clinical correlation with patient history and other diagnostic information is necessary to determine patient infection status. The expected result is Negative.  Fact Sheet for Patients: BloggerCourse.com  Fact Sheet for Healthcare Providers: SeriousBroker.it  This test is not yet approved or cleared by the [place] FDA and  has been authorized for detection and/or diagnosis of SARS-CoV-2 by FDA under an Emergency Use Authorization (EUA).  This EUA will remain in effect (meaning this test can be  used) for the duration of  the COVID-19 declaration under Section 564(b)(1) of the Act, 21 U.S.C. section 360bbb-3(b)(1), unless the authorization is terminated or revoked sooner.     Influenza A by PCR NEGATIVE NEGATIVE Final   Influenza B by PCR  NEGATIVE NEGATIVE Final    Comment: (NOTE) The Xpert Xpress SARS-CoV-2/FLU/RSV plus assay is intended as an aid in the diagnosis of influenza from Nasopharyngeal swab specimens and should not be used as a sole basis for treatment. Nasal washings and aspirates are unacceptable for Xpert Xpress SARS-CoV-2/FLU/RSV testing.  Fact Sheet for Patients: BloggerCourse.com  Fact Sheet for Healthcare Providers: SeriousBroker.it  This test is not yet approved or cleared by the [place] FDA and has been authorized for detection and/or diagnosis of SARS-CoV-2 by FDA under an Emergency Use Authorization (EUA).  This EUA will remain in effect (meaning this test can be used) for the duration of the COVID-19 declaration under Section 564(b)(1) of the Act, 21 U.S.C. section 360bbb-3(b)(1), unless the authorization is terminated or revoked.  Performed at [facility], [address], [place], [place] [address]   MRSA PCR Screening     Status: None   Collection Time: 04/17/21 12:46 AM   Specimen: Nasal Mucosa; Nasopharyngeal  Result Value Ref Range Status   MRSA by PCR NEGATIVE NEGATIVE Final    Comment:        The GeneXpert MRSA Assay (FDA approved for NASAL specimens only), is one component of a comprehensive MRSA colonization surveillance program. It is not intended to diagnose MRSA infection nor to guide or monitor treatment for MRSA infections. Performed at [facility], [address] [place], [place] [address]      Radiology Studies: No results found. Scheduled Meds: . arformoterol  15 mcg Nebulization BID  . bisoprolol  5 mg Oral Daily  . budesonide (PULMICORT) nebulizer solution  0.25 mg Nebulization BID  . Chlorhexidine Gluconate Cloth  6 each Topical Daily  . enoxaparin (LOVENOX) injection  40 mg Subcutaneous Q24H  . folic acid  1 mg Intravenous Daily  . furosemide  20 mg Intravenous Daily  . methylPREDNISolone (SOLU-MEDROL) injection  60 mg Intravenous Q8H  . nicotine  21 mg Transdermal Daily  . pantoprazole (PROTONIX) IV  40 mg Intravenous Q24H  . revefenacin  175 mcg Nebulization Daily  . thiamine injection  100 mg Intravenous Daily   Continuous Infusions: . dexmedetomidine (PRECEDEX) IV infusion 1 mcg/kg/hr (04/20/21 1645)  . dextrose 5 % and 0.9 % NaCl with KCl 20 mEq/L Stopped (04/20/21 0634)  . doxycycline (VIBRAMYCIN) IV 100 mg (04/20/21 0729)  . valproate sodium 500 mg (04/20/21 1426)     LOS: 4 days    Time spent: 35 minutes   Vassie Loll, MD [facility]   To contact the attending provider between 7A-7P or the covering provider during after  hours 7P-7A, please log into the web site [email or web address] and access using universal [place] password for that web site. If you do not have the password, please call the hospital operator.  04/20/2021, 4:46 PM

## 2021-04-21 DIAGNOSIS — J9622 Acute and chronic respiratory failure with hypercapnia: Secondary | ICD-10-CM | POA: Diagnosis not present

## 2021-04-21 DIAGNOSIS — I10 Essential (primary) hypertension: Secondary | ICD-10-CM

## 2021-04-21 DIAGNOSIS — G9341 Metabolic encephalopathy: Secondary | ICD-10-CM | POA: Diagnosis not present

## 2021-04-21 DIAGNOSIS — J9602 Acute respiratory failure with hypercapnia: Secondary | ICD-10-CM | POA: Diagnosis not present

## 2021-04-21 DIAGNOSIS — J441 Chronic obstructive pulmonary disease with (acute) exacerbation: Secondary | ICD-10-CM | POA: Diagnosis not present

## 2021-04-21 DIAGNOSIS — I5032 Chronic diastolic (congestive) heart failure: Secondary | ICD-10-CM | POA: Diagnosis not present

## 2021-04-21 LAB — BLOOD GAS, ARTERIAL
Acid-Base Excess: 20.6 mmol/L — ABNORMAL HIGH (ref 0.0–2.0)
Bicarbonate: 41.9 mmol/L — ABNORMAL HIGH (ref 20.0–28.0)
FIO2: 36
O2 Saturation: 99 %
Patient temperature: 37
pCO2 arterial: 98.9 mmHg (ref 32.0–48.0)
pH, Arterial: 7.306 — ABNORMAL LOW (ref 7.350–7.450)
pO2, Arterial: 190 mmHg — ABNORMAL HIGH (ref 83.0–108.0)

## 2021-04-21 MED ORDER — CLONIDINE HCL 0.1 MG/24HR TD PTWK
0.1000 mg | MEDICATED_PATCH | TRANSDERMAL | Status: DC
  Administered 2021-04-21: 0.1 mg via TRANSDERMAL
  Filled 2021-04-21: qty 1

## 2021-04-21 MED ORDER — BISOPROLOL FUMARATE 5 MG PO TABS: 2.5000 mg | ORAL_TABLET | Freq: Every day | ORAL | Status: DC

## 2021-04-21 MED ORDER — HALOPERIDOL LACTATE 5 MG/ML IJ SOLN
2.0000 mg | INTRAMUSCULAR | Status: DC | PRN
  Administered 2021-04-21 – 2021-04-24 (×4): 2 mg via INTRAVENOUS
  Filled 2021-04-21 (×4): qty 1

## 2021-04-21 MED ORDER — METHYLPREDNISOLONE SODIUM SUCC 40 MG IJ SOLR
40.0000 mg | Freq: Two times a day (BID) | INTRAMUSCULAR | Status: DC
  Administered 2021-04-21 – 2021-04-24 (×6): 40 mg via INTRAVENOUS
  Filled 2021-04-21 (×7): qty 1

## 2021-04-21 NOTE — Progress Notes (Signed)
eLink Physician-Brief Progress Note Patient Name: Jeffrey [name] DOB: February 17, 1961 MRN: [id number]   Date of Service  04/21/2021  HPI/Events of Note  Agitation - Nursing request for soft bilateral wrist restraints.   eICU Interventions  Will order soft bilateral wrist restraints X 12 hours.     Intervention Category Major Interventions: Delirium, psychosis, severe agitation - evaluation and management  Treazure Nery Eugene 04/21/2021, 7:52 PM

## 2021-04-21 NOTE — Progress Notes (Signed)
PROGRESS NOTE    Jeffrey [name]  YBO:[id number] DOB: 03-05-1961 DOA: 05/10/21 PCP: Gareth Morgan, MD   Chief Complaint  Patient presents with  . Shortness of Breath    Brief admission Narrative:  As per H&P written by Dr. Devoria Albe [name]  is a 60 y.o. male, with medical history significant forhypertension, hyperlipidemia, COPD(on 3-4 LPM of supplemental oxygen via [place] at baseline), CHF, GERD, as well on home BiPAP machine, with very poor compliance, oriented, who presents to ED secondary to shortness of breath, reports he started to feel worsening shortness of breath today, where he had to call EMS, as his home nebulizer machine has not been helping, he was given albuterol, Atrovent, as well Solu-Medrol, he reports improvement of his symptoms after EMS treatment, he denies any fever, chills, nausea, vomiting, no abdominal pain, no COVID exposures, no chest pain, no leg swelling, reports he received his COVID-vaccine and booster. -in ED patient is mildly confused, but awake, ABG showing PCO2 retention of 112, pH of 7.3, his COVID-19 test was positive, his chest x-ray chronic some chronic interstitial changes and COPD, with possible superimposed atypical infection, [facility] hospitalist consulted to admit .  Assessment & Plan: 1-acute on chronic hypoxic and hypercarbic respiratory failure secondary to COPD exacerbation and COVID-19 pneumonia -Chest x-ray is also raising concern for component of bronchiectasis/bacterial infection; will continue doxycycline; 2 more days left to complete antibiotic therapy. -Competed treatment with remdesivir and continue receiving steroids -Continue bronchodilators/nebulizer management as per PCCM recommendations.  -continue to Wean oxygen supplementation down; goal is for saturation between 88 and 92; most recent ABG done demonstrating a PO2 of 195; reason why patient is struggling to clear his CO2. -Appreciate assistance and recommendation by PCCM and  will follow instructions. -ABG demonstrating CO2 of 98.9; after discussing with Dr. Sherene Sires PCCM will plan titrating down oxygen and allowing patient to wake up from Precedex drip and clear CO2 without the need of BiPAP or intubation at this moment.  pH 7.306  2-alcohol abuse with withdrawal symptoms -Continue to wean off Precedex completely. -Follow clinical response and use CIWA score to determine the need of Ativan/Haldol -Sitter at bedside will be requested to facilitate transitioning him off drip. -Thiamine and folic acid to be continued by mouth currently.  3-Cigarette smoker -Cessation counseling provided. -will continue nicotine patch.  4-Chronic diastolic CHF (congestive heart failure) (HCC) -appears Compensated currently -Continue home dose of Lasix, IV now while unable to take PO's. -continue Daily weights-strict I's and O's. -Low-sodium diet has been ordered and will be follow when able to tolerate by mouth.  5-Essential hypertension -Stable and well-controlled currently -Continue to follow vital signs and treat accordingly. -Clonidine patch will be initiated for further assistance titrating Precedex off.  6-hypokalemia -Continue to follow electrolytes trend and replete as needed.   DVT prophylaxis: Lovenox  Code Status: Full code. Family Communication: No family at bedside. Disposition:   Status is: Inpatient   Dispo: The patient is from: Home              Anticipated d/c is to: Home              Patient currently not medically stable for discharge; intermittently confused, agitated and with elevated CIWA; has been started on Precedex and is actively receiving treatment for acute COVID infection along with COPD exacerbation and concern for bacterial pneumonia.   Difficult to place patient No    Consultants:   PCCM.   Procedures:  See below  for x-ray reports.   Antimicrobials:  Doxycycline   Subjective: There was report of significant agitation  overnight patient back on Precedex this morning; significantly sedated at this time unable to answer questions or following commands.  There is no fever.  Objective: Vitals:   04/21/21 1000 04/21/21 1100 04/21/21 1200 04/21/21 1609  BP: (!) 109/53 130/67 (!) 113/58   Pulse: 79 98 70 81  Resp: (!) 22 (!) 26 (!) 21 (!) 21  Temp:    (!) 97.4 F (36.3 C)  TempSrc:    Axillary  SpO2: 100% 100% 100% 97%  Weight:      Height:        Intake/Output Summary (Last 24 hours) at 04/21/2021 1730 Last data filed at 04/21/2021 1543 Gross per 24 hour  Intake 1232.47 ml  Output 1200 ml  Net 32.47 ml   Filed Weights   03/30/2021 1751 04/21/21 0413  Weight: 83.9 kg 86.4 kg    Examination: General exam: Afebrile, obtunded, increase sedation while still receiving Precedex. Respiratory system: Good air movement bilaterally; no wheezing, no using accessory muscle. Cardiovascular system: Rate controlled, no rubs, no gallops Gastrointestinal system: Abdomen is nondistended, soft and nontender. No organomegaly or masses felt. Normal bowel sounds heard. Central nervous system: Unable to properly assess secondary to current sedation status.  Moving 4 limbs spontaneously. Extremities: No cyanosis or clubbing. Skin: No petechiae. Psychiatry: Unable to properly assess with current mentation status.  Data Reviewed: I have personally reviewed following labs and imaging studies  CBC: Recent Labs  Lab 04/20/2021 1838 04/17/21 0410 04/20/21 0744  WBC 9.3 5.1 5.7  HGB 13.3 12.2* 12.3*  HCT 41.1 38.1* 39.2  MCV 92.8 91.4 93.8  PLT 258 250 195    Basic Metabolic Panel: Recent Labs  Lab 04/19/2021 1838 04/17/21 0410 04/18/21 0407 04/20/21 0528  NA 137 135  --  140  K 4.1 3.3*  --  4.2  CL 78* 78*  --  94*  CO2 47* 43*  --  39*  GLUCOSE 130* 127*  --  147*  BUN 12 15  --  26*  CREATININE 0.55* 0.57*  --  0.42*  CALCIUM 9.1 9.2  --  8.5*  MG  --   --  2.5*  --     GFR: Estimated Creatinine  Clearance: 101.4 mL/min (A) (by C-G formula based on SCr of 0.42 mg/dL (L)).  Liver Function Tests: Recent Labs  Lab 04/17/21 0410  AST 26  ALT 23  ALKPHOS 93  BILITOT 0.6  PROT 6.9  ALBUMIN 3.3*    Recent Results (from the past 240 hour(s))  Resp Panel by RT-PCR (Flu A&B, Covid) Nasopharyngeal Swab     Status: Abnormal   Collection Time: 03/23/2021  6:13 PM   Specimen: Nasopharyngeal Swab; Nasopharyngeal(NP) swabs in vial transport medium  Result Value Ref Range Status   SARS Coronavirus 2 by RT PCR POSITIVE (A) NEGATIVE Final    Comment: RESULT CALLED TO, READ BACK BY AND VERIFIED WITH: K BELTON,RN@[id number]  04/04/2021 MKELLY (NOTE) SARS-CoV-2 target nucleic acids are DETECTED.  The SARS-CoV-2 RNA is generally detectable in upper respiratory specimens during the acute phase of infection. Positive results are indicative of the presence of the identified virus, but do not rule out bacterial infection or co-infection with other pathogens not detected by the test. Clinical correlation with patient history and other diagnostic information is necessary to determine patient infection status. The expected result is Negative.  Fact Sheet for Patients: BloggerCourse.com  Fact  Sheet for Healthcare Providers: SeriousBroker.it  This test is not yet approved or cleared by the [place] and  has been authorized for detection and/or diagnosis of SARS-CoV-2 by FDA under an Emergency Use Authorization (EUA).  This EUA will remain in effect (meaning this test can be  used) for the duration of  the COVID-19 declaration under Section 564(b)(1) of the Act, 21 U.S.C. section 360bbb-3(b)(1), unless the authorization is terminated or revoked sooner.     Influenza A by PCR NEGATIVE NEGATIVE Final   Influenza B by PCR NEGATIVE NEGATIVE Final    Comment: (NOTE) The Xpert Xpress SARS-CoV-2/FLU/RSV plus assay is intended as an aid in the  diagnosis of influenza from Nasopharyngeal swab specimens and should not be used as a sole basis for treatment. Nasal washings and aspirates are unacceptable for Xpert Xpress SARS-CoV-2/FLU/RSV testing.  Fact Sheet for Patients: BloggerCourse.com  Fact Sheet for Healthcare Providers: SeriousBroker.it  This test is not yet approved or cleared by the [place] FDA and has been authorized for detection and/or diagnosis of SARS-CoV-2 by FDA under an Emergency Use Authorization (EUA). This EUA will remain in effect (meaning this test can be used) for the duration of the COVID-19 declaration under Section 564(b)(1) of the Act, 21 U.S.C. section 360bbb-3(b)(1), unless the authorization is terminated or revoked.  Performed at [facility], [address], [place], [place] [address]   MRSA PCR Screening     Status: None   Collection Time: 04/17/21 12:46 AM   Specimen: Nasal Mucosa; Nasopharyngeal  Result Value Ref Range Status   MRSA by PCR NEGATIVE NEGATIVE Final    Comment:        The GeneXpert MRSA Assay (FDA approved for NASAL specimens only), is one component of a comprehensive MRSA colonization surveillance program. It is not intended to diagnose MRSA infection nor to guide or monitor treatment for MRSA infections. Performed at [facility], [address], [place], [place] [address]      Radiology Studies: No results found. Scheduled Meds: . arformoterol  15 mcg Nebulization BID  . bisoprolol  2.5 mg Oral Daily  . budesonide (PULMICORT) nebulizer solution  0.25 mg Nebulization BID  . Chlorhexidine Gluconate Cloth  6 each Topical Daily  . cloNIDine  0.1 mg Transdermal Weekly  . enoxaparin (LOVENOX) injection  40 mg Subcutaneous Q24H  . folic acid  1 mg Intravenous Daily  . furosemide  20 mg Intravenous Daily  . methylPREDNISolone (SOLU-MEDROL) injection  40 mg Intravenous Q12H  . nicotine  21 mg Transdermal Daily  .  pantoprazole (PROTONIX) IV  40 mg Intravenous Q24H  . revefenacin  175 mcg Nebulization Daily  . thiamine injection  100 mg Intravenous Daily   Continuous Infusions: . dexmedetomidine (PRECEDEX) IV infusion 0.4 mcg/kg/hr (04/21/21 1543)  . dextrose 5 % and 0.9 % NaCl with KCl 20 mEq/L Stopped (04/21/21 0603)  . doxycycline (VIBRAMYCIN) IV 100 mg (04/21/21 0920)  . valproate sodium 500 mg (04/21/21 1405)     LOS: 5 days    Time spent: 35 minutes   Vassie Loll, MD [facility]   To contact the attending provider between 7A-7P or the covering provider during after hours 7P-7A, please log into the web site [email or web address] and access using universal [place] password for that web site. If you do not have the password, please call the hospital operator.  04/21/2021, 5:30 PM

## 2021-04-21 NOTE — Progress Notes (Signed)
eLink Physician-Brief Progress Note Patient Name: DRAYSEN WEYGANDT DOB: 1961-09-15 MRN: [id number]   Date of Service  04/21/2021  HPI/Events of Note  Agitation - Patient is all over the bed and throwing legs over bed rails on current sedation regime.   eICU Interventions  Plan: 1. Will decrease the Haldol dose to 2 mg IV and increase the frequency to Q 3 hours  PRN agitation.  If he isn't able to be adequately sedated with this regimen of decreased Haldol dose Q 3 hours, he will require intubation and mechanical ventilation.      Intervention Category Major Interventions: Delirium, psychosis, severe agitation - evaluation and management  Doral Ventrella [place] 04/21/2021, 9:23 PM

## 2021-04-21 NOTE — Progress Notes (Signed)
Spoke with MD regarding patient's lethargy and inability to follow commands. precedex gtt stopped. ABG obtained. Pco2 98.9. pulmonologist contacted, requested to have patient o2 >88%, and to turn o2 down if he keeps saturating 100%. O2 was titrated to 2L [place].   Patient is now awake and restless in the bed, trying to kick his legs up and over the edge. He is not redirectable. And order for 1:1 observation was placed for safety. Bed alarm on.

## 2021-04-21 NOTE — Progress Notes (Addendum)
NAME:  Jeffrey [name], MRN:  [id number], DOB:  [birth year], LOS: 5 ADMISSION DATE:  14-May-2021, CONSULTATION DATE:   04/17/2021 REFERRING MD:  Jackelyn Hoehn, CHIEF COMPLAINT: Respiratory distress on BiPAP  History of Present Illness:  60 year old reportedly quit smoking 12/2020  with known COPD III spirometric criteria plus mod severe Mitral stenosis   and chronic hypoxemic and hypercarbic  respiratory failure on 3 L oxygen, patient of Dr. Sherene Sires admitted with shortness of breath ongoing x several days not relieved with short acting bronchodilators at home.  He reported nonproductive cough.  In the ED he was confused, ABG showed 7.30/112, COVID testing was positive. He is vaccinated and boosted, it was felt that he was more likely having a COPD exacerbation rather than infective COVID-pneumonia, he was placed on BiPAP, started on remdesivir and steroids. 5/26 increased agitation, not keeping BiPAP on, RN concerned about EtOH withdrawal, PCCM consulted emergently  Pertinent  Medical History  Chronic diastolic heart failure. 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 5/26 increased agitation , not tolerating BiPAP . 5/27 remains on precedex . 5/30 overdsedated on precedex 0.5  > rec taper and try clonidine if bp will tolerate and if not d/c bisoprolol    Scheduled Meds: . arformoterol  15 mcg Nebulization BID  . bisoprolol  5 mg Oral Daily  . budesonide (PULMICORT) nebulizer solution  0.25 mg Nebulization BID  . Chlorhexidine Gluconate Cloth  6 each Topical Daily  . enoxaparin (LOVENOX) injection  40 mg Subcutaneous Q24H   . folic acid  1 mg Intravenous Daily  . furosemide  20 mg Intravenous Daily  . methylPREDNISolone (SOLU-MEDROL) injection  60 mg Intravenous Q8H  . nicotine  21 mg Transdermal Daily  . pantoprazole (PROTONIX) IV  40 mg Intravenous Q24H  . revefenacin  175 mcg Nebulization Daily  . thiamine injection  100 mg Intravenous Daily   Continuous Infusions: . dexmedetomidine (PRECEDEX) IV infusion 0.5 mcg/kg/hr (04/21/21 1751)  . dextrose 5 % and 0.9 % NaCl with KCl 20 mEq/L Stopped (04/21/21 0603)  . doxycycline (VIBRAMYCIN) IV Stopped (04/20/21 2131)  . valproate sodium 55 mL/hr at 04/21/21 0614   PRN Meds:.albuterol, albuterol, haloperidol lactate, LORazepam   Interim History / Subjective:  Blank stare but no increased wob, hard to titrate precedex down per nursing .  Objective   Blood pressure (!) 109/57, pulse 79, temperature 97.9 F (36.6 C), temperature source Axillary, resp. rate (!) 24, height 5\' 10"  (1.778 m), weight 86.4 kg, SpO2 100 %.        Intake/Output Summary (Last 24 hours) at 04/21/2021 0842 Last data filed at 04/21/2021 04/23/2021 Gross per 24 hour  Intake 1676.98 ml  Output 350 ml  Net 1326.98 ml   Filed Weights   05/14/2021 1751 04/21/21 0413  Weight: 83.9 kg 86.4 kg    Examination: Elderly wm more chronically than acutely ill / blankly staring and no response to verbal Tmax 98.8 no change   sats 100% on 3lpm NP   No jvd Oropharynx clear,  mucosa nl Neck supple Lungs with distant bs bilaterally  RRR no s3   Abd soft, limited  excursion  Extr warm with  no edema or clubbing noted Neuro   no apparent motor deficits per nursing when not sedated thrashes all 4 but not f/c     I personally reviewed images and agree with radiology impression as follows:  CXR:   Portable 04/18/2021 Coarsened reticulonodular and bronchitic changes, most pronounced in the mid to lower lungs much of which could reflect some chronic interstitial changes and COPD though a superimposed  atypical infectious or inflammatory process is not entirely excluded.  Redemonstration of a pulmonary nodule in the mid left lung.  Linear opacity in the left lung base, possible scarring or atelectasis.    Resolved Hospital Problem list     Assessment & Plan:   Delirium tremens from alcohol withdrawal. Hx of bipolar disease. - continue precedex for RASS goal 0   - prn haldol, ativan - valproic acid 500 mg IV q8h until able to swallow pills - continue to hold outpt prozac, neurontin, zyprexa, trazodone, B12 until able to swallow pills >>>  Try adding catapres patch today as a way of getting off precedex infusion   Acute on chronic hypoxic, hypercapnic respiratory failure. COPD exacerbation. - goal SpO2 88 to 92% - no higher as tends to retain more C02  - f/u CXR am  5/31  - mental status precludes [place] of Bipap at this time; if respiratory status gets worse, then he would need intubation though I would argue this would be the last step and appears to be nearing EOL discussion both in term of his chronic and acute deterioration  -  Doxycycline per [facility] - continue solumedrol but decrease to 40 mg IV a 12 h - continue neb  pulmicort, brovana, yupelri with prn albuterol  COVID 19 positive s/p reported vax x 3  -  Relatively mild COVID 19 pna likely  rx per [facility]= remdesivir  And weaning  Steroids with TME that might be partly steroid induced psychosis   Hx of HTN, [name], Mitral stenois  - hold outpt aspirin, lipitor,  cardizem, lasix until he is able to swallow pills >>> reduce bisoprolol to 2.5 mg daily   Hypokalemia,resolved  - f/u BMET  Best practice (right click and "Reselect all SmartList Selections" daily)  Diet:  NPO Pain/Anxiety/Delirium protocol (if indicated): Yes (RASS goal 0) VAP protocol (if indicated): Not indicated DVT prophylaxis: LMWH GI prophylaxis: PPI Glucose control:  SSI No Central venous access:  N/A Arterial line:  N/A Foley:  N/A Mobility:  bed  rest  PT consulted: N/A Last date of multidisciplinary goals of care discussion [per TR[facility]] - I would favor NCB status here. Code Status:  full code Disposition: ICU  Labs    CMP Latest Ref Rng & Units 04/20/2021 04/17/2021 04/06/2021  Glucose 70 - 99 mg/dL 093(O) 671(I) 458(K)  BUN 6 - 20 mg/dL 99(I) 15 12  Creatinine 0.61 - 1.24 mg/dL 3.38(S) 5.05(L) 9.76(B)  Sodium 135 - 145 mmol/L 140 135 137  Potassium 3.5 - 5.1 mmol/L 4.2 3.3(L) 4.1  Chloride 98 - 111 mmol/L 94(L) 78(L) 78(L)  CO2 22 - 32 mmol/L 39(H) 43(H) 47(H)  Calcium 8.9 - 10.3 mg/dL 3.4(L) 9.2 9.1  Total Protein 6.5 - 8.1 g/dL - 6.9 -  Total Bilirubin 0.3 - 1.2 mg/dL - 0.6 -  Alkaline Phos 38 - 126 U/L - 93 -  AST 15 - 41 U/L - 26 -  ALT 0 - 44 U/L - 23 -    CBC Latest Ref Rng & Units 04/20/2021 04/17/2021 04/14/2021  WBC  4.0 - 10.5 K/uL 5.7 5.1 9.3  Hemoglobin 13.0 - 17.0 g/dL 12.3(L) 12.2(L) 13.3  Hematocrit 39.0 - 52.0 % 39.2 38.1(L) 41.1  Platelets 150 - 400 K/uL 195 250 258    ABG    Component Value Date/Time   PHART 7.428 04/17/2021 0815   PCO2ART 75.1 (HH) 04/17/2021 0815   PO2ART 95.8 04/17/2021 0815   HCO3 44.8 (H) 04/17/2021 0815   TCO2 34 (H) 12/21/2018 0357   ACIDBASEDEF 0.6 02/17/2019 1850   O2SAT 97.6 04/17/2021 0815    CBG (last 3)  No results for input(s): GLUCAP in the last 72 hours.   The patient is critically ill with multiple organ systems failure and requires high complexity decision making for assessment and support, frequent evaluation and titration of therapies, application of advanced monitoring technologies and extensive interpretation of multiple databases. Critical Care Time devoted to patient care services described in this note is 40 minutes.    Sandrea Hughs, MD Pulmonary and Critical Care Medicine [facility] Cell [phone]   After 7:00 pm call Elink  [phone]

## 2021-04-22 ENCOUNTER — Inpatient Hospital Stay (HOSPITAL_COMMUNITY): Payer: Medicare Other

## 2021-04-22 DIAGNOSIS — J9602 Acute respiratory failure with hypercapnia: Secondary | ICD-10-CM

## 2021-04-22 DIAGNOSIS — J441 Chronic obstructive pulmonary disease with (acute) exacerbation: Secondary | ICD-10-CM | POA: Diagnosis not present

## 2021-04-22 DIAGNOSIS — J9622 Acute and chronic respiratory failure with hypercapnia: Secondary | ICD-10-CM | POA: Diagnosis not present

## 2021-04-22 DIAGNOSIS — G9341 Metabolic encephalopathy: Secondary | ICD-10-CM | POA: Diagnosis not present

## 2021-04-22 DIAGNOSIS — I5032 Chronic diastolic (congestive) heart failure: Secondary | ICD-10-CM | POA: Diagnosis not present

## 2021-04-22 LAB — BLOOD GAS, ARTERIAL
Acid-Base Excess: 19.9 mmol/L — ABNORMAL HIGH (ref 0.0–2.0)
Acid-Base Excess: 19.1 mmol/L — ABNORMAL HIGH (ref 0.0–2.0)
Bicarbonate: 42.4 mmol/L — ABNORMAL HIGH (ref 20.0–28.0)
Bicarbonate: 42 mmol/L — ABNORMAL HIGH (ref 20.0–28.0)
FIO2: 40
FIO2: 40
MECHVT: 580 mL
O2 Saturation: 96.2 %
O2 Saturation: 98.9 %
PEEP: 5 cmH2O
Patient temperature: 36.9
Patient temperature: 37
RATE: 20 resp/min
pCO2 arterial: 67.2 mmHg (ref 32.0–48.0)
pCO2 arterial: 59.1 mmHg — ABNORMAL HIGH (ref 32.0–48.0)
pH, Arterial: 7.446 (ref 7.350–7.450)
pH, Arterial: 7.487 — ABNORMAL HIGH (ref 7.350–7.450)
pO2, Arterial: 77.1 mmHg — ABNORMAL LOW (ref 83.0–108.0)
pO2, Arterial: 130 mmHg — ABNORMAL HIGH (ref 83.0–108.0)

## 2021-04-22 LAB — GLUCOSE, CAPILLARY
Glucose-Capillary: 123 mg/dL — ABNORMAL HIGH (ref 70–99)
Glucose-Capillary: 133 mg/dL — ABNORMAL HIGH (ref 70–99)
Glucose-Capillary: 106 mg/dL — ABNORMAL HIGH (ref 70–99)
Glucose-Capillary: 118 mg/dL — ABNORMAL HIGH (ref 70–99)

## 2021-04-22 MED ORDER — CHLORHEXIDINE GLUCONATE 0.12% ORAL RINSE (MEDLINE KIT): 15.0000 mL | Freq: Two times a day (BID) | OROMUCOSAL | Status: DC

## 2021-04-22 MED ORDER — MIDAZOLAM HCL 2 MG/2ML IJ SOLN
2.0000 mg | INTRAMUSCULAR | Status: DC | PRN
  Administered 2021-04-22 – 2021-04-24 (×8): 2 mg via INTRAVENOUS
  Filled 2021-04-22 (×5): qty 2

## 2021-04-22 MED ORDER — DEXMEDETOMIDINE HCL IN NACL 400 MCG/100ML IV SOLN
0.4000 ug/kg/h | INTRAVENOUS | Status: DC
  Administered 2021-04-22 (×2): 0.4 ug/kg/h via INTRAVENOUS
  Administered 2021-04-23: 0.8 ug/kg/h via INTRAVENOUS
  Administered 2021-04-23: 0.5 ug/kg/h via INTRAVENOUS
  Administered 2021-04-24: 0.6 ug/kg/h via INTRAVENOUS
  Filled 2021-04-22 (×8): qty 100

## 2021-04-22 MED ORDER — ORAL CARE MOUTH RINSE
15.0000 mL | OROMUCOSAL | Status: DC
  Administered 2021-04-22 – 2021-04-24 (×23): 15 mL via OROMUCOSAL

## 2021-04-22 MED ORDER — FENTANYL 2500MCG IN NS 250ML (10MCG/ML) PREMIX INFUSION
0.0000 ug/h | INTRAVENOUS | Status: DC
  Administered 2021-04-22: 2 ug/h via INTRAVENOUS
  Administered 2021-04-22: 150 ug/h via INTRAVENOUS
  Administered 2021-04-23: 250 ug/h via INTRAVENOUS
  Administered 2021-04-24: 300 ug/h via INTRAVENOUS
  Filled 2021-04-22 (×6): qty 250

## 2021-04-22 MED ORDER — CHLORHEXIDINE GLUCONATE 0.12% ORAL RINSE (MEDLINE KIT)
15.0000 mL | Freq: Two times a day (BID) | OROMUCOSAL | Status: DC
  Administered 2021-04-22 – 2021-04-24 (×6): 15 mL via OROMUCOSAL

## 2021-04-22 MED ORDER — ORAL CARE MOUTH RINSE
15.0000 mL | OROMUCOSAL | Status: DC
Start: 2021-04-22 — End: 2021-04-22

## 2021-04-22 MED ORDER — MIDAZOLAM HCL 2 MG/2ML IJ SOLN
2.0000 mg | INTRAMUSCULAR | Status: AC | PRN
  Administered 2021-04-23 – 2021-04-24 (×3): 2 mg via INTRAVENOUS
  Filled 2021-04-22 (×7): qty 2

## 2021-04-22 NOTE — ED Provider Notes (Signed)
I was called to the ICU to intubate this patient.  In brief was admitted to the hospital COVID-positive with respiratory failure.  Progressive altered mental status and hypercarbia.  On my evaluation he is altered not following commands.  He has not a BiPAP candidate.  Recent O2 sats 68 to 85%.  I requested RT to increase nasal cannula flow to 15 L for preoxygenation.  RSI intubation without incident   Physical Exam  BP (!) 155/49   Pulse (!) 145   Temp 98.4 F (36.9 C) (Oral)   Resp (!) 32   Ht 1.778 m (5\' 10" )   Wt 86.4 kg   SpO2 (!) 85%   BMI 27.33 kg/m   Physical Exam Critically ill Acute delirium, not following commands No respiratory distress  ED Course/Procedures   Clinical Course as of 04/22/21 0305  Wed 04-27-21  1919 Venous PCO2 is 112.  Patient's oxygen is 94%.  We will start him on BiPAP.  He is awake and alert [JK]  2110 Remains awake and alert.  Possibly some confusion was hypercarbia although unclear if some of this is behavioral.  I have stressed the importance of making sure to keep his mask on.  We will try a small dose of Ativan.  We will continue to monitor for right now I do not feel intubation is necessary [JK]  2148 Covid test is positive [JK]    Clinical Course User Index [JK] 2149, MD    Procedure Name: Intubation Date/Time: 04/22/2021 3:07 AM Performed by: 05/10/2021, MD Pre-anesthesia Checklist: Patient identified, Patient being monitored, Emergency Drugs available, Timeout performed and Suction available Oxygen Delivery Method: Nasal cannula Preoxygenation: Pre-oxygenation with 100% oxygen Induction Type: Rapid sequence Ventilation: Mask ventilation without difficulty Laryngoscope Size: Glidescope and 4 Grade View: Grade I Tube size: 7.5 mm Number of attempts: 1 Placement Confirmation: ETT inserted through vocal cords under direct vision,  CO2 detector and Breath sounds checked- equal and bilateral Secured at: 21 cm Tube  secured with: ETT holder Dental Injury: Teeth and Oropharynx as per pre-operative assessment            Shon Baton, MD 04/22/21 [phone]

## 2021-04-22 NOTE — Progress Notes (Signed)
PROGRESS NOTE    Jeffrey [name]  ZRA:[id number] DOB: 02-Jan-1961 DOA: 04/09/2021 PCP: Lemmie Evens, MD   Chief Complaint  Patient presents with  . Shortness of Breath    Brief admission Narrative:  As per H&P written by Dr. Mariah Milling [place]  is a 60 y.o. male, with medical history significant forhypertension, hyperlipidemia, COPD(on 3-4 LPM of supplemental oxygen via  at baseline), CHF, GERD, as well on home BiPAP machine, with very poor compliance, oriented, who presents to ED secondary to shortness of breath, reports he started to feel worsening shortness of breath today, where he had to call EMS, as his home nebulizer machine has not been helping, he was given albuterol, Atrovent, as well Solu-Medrol, he reports improvement of his symptoms after EMS treatment, he denies any fever, chills, nausea, vomiting, no abdominal pain, no COVID exposures, no chest pain, no leg swelling, reports he received his COVID-vaccine and booster. -in ED patient is mildly confused, but awake, ABG showing PCO2 retention of 112, pH of 7.3, his COVID-19 test was positive, his chest x-ray chronic some chronic interstitial changes and COPD, with possible superimposed atypical infection, [facility] hospitalist consulted to admit .  Assessment & Plan: 1-acute on chronic hypoxic and hypercarbic respiratory failure secondary to COPD exacerbation and COVID-19 pneumonia -Chest x-ray is also raising concern for component of bronchiectasis/bacterial infection; will continue doxycycline; 2 more days left to complete antibiotic therapy. -Competed treatment with remdesivir and continue receiving steroids tapering. -Continue bronchodilators/nebulizer management as per PCCM recommendations.  -Currently intubated and mechanically ventilated; will follow PCCM assistance and recommendation. -Palliative care consulted; per PCCM note CODE BLUE status favored. -Most recent ABG demonstrating PH 7.487 CO2 of 59.1and a PO2 of  130.  -Case discussed with Dr. Melvyn Novas  2-alcohol abuse with withdrawal symptoms/delirium tremens/bipolar disorder -Looking for a RASS score of 0 -Continue as needed anxiolytic and antipsychotic medications. -Now that the patient is intubated decision has been made to use Precedex along with fentanyl drips. -Continue thiamine and folic acid  3-Cigarette smoker -Cessation counseling provided. -will continue nicotine patch.  4-Chronic diastolic CHF (congestive heart failure) ([place]) -appears Compensated currently -Continue home dose of Lasix, IV now while unable to take PO's. -continue Daily weights-strict I's and O's. -No crackles appreciated on exam.  5-Essential hypertension -Overall stable and well-controlled. -Continue to follow vital signs and treat accordingly. -Continue clonidine patch.  6-hypokalemia -Continue to follow electrolytes trend and replete as needed.   DVT prophylaxis: Lovenox  Code Status: Full code. Family Communication: No family at bedside. Disposition:   Status is: Inpatient   Dispo: The patient is from: Home              Anticipated d/c is to: Home              Patient currently not medically stable for discharge; experiencing acute on chronic hypoxic, hypercapnic respiratory failure and COPD exacerbation Dettinger requiring mechanical ventilation and ventilatory support since 04/21/2021.  Continue supportive care, ventilatory management per PCCM and antibiotics.   Difficult to place patient No    Consultants:   PCCM.   Procedures:  See below for x-ray reports.   Antimicrobials:  Doxycycline   Subjective: No fever, no nausea, no vomiting.  Patient with significant episode of agitation/restlessness throughout the night and a subsequent respiratory distress that lead into requirement of intubation.  Currently intubated, sedated and mechanically ventilated.  Objective: Vitals:   04/22/21 1300 04/22/21 1400 04/22/21 1500 04/22/21 1628  BP: (!)  81/41 Marland Kitchen)  112/45 (!) 85/42   Pulse: 80 (!) 116 84 83  Resp: (!) 22 10 (!) 8 18  Temp:      TempSrc:      SpO2: 96% 97% 98% 96%  Weight:      Height:        Intake/Output Summary (Last 24 hours) at 04/22/2021 1657 Last data filed at 04/22/2021 1400 Gross per 24 hour  Intake 2458.67 ml  Output 1050 ml  Net 1408.67 ml   Filed Weights   04/04/2021 1751 04/21/21 0413  Weight: 83.9 kg 86.4 kg    Examination: General exam: Intubated, sedated and mechanically ventilated; no fever, no chest pain, no nausea or vomiting reported.  Continues to have intermittent episode of agitation and restlessness as per nursing report.  Respiratory system: No wheezing or crackles on exam.  No breathing above ventilatory support currently. Cardiovascular system: Sinus tachycardia; no rubs or gallops. Gastrointestinal system: Abdomen is nondistended, soft and nontender. No organomegaly or masses felt. Normal bowel sounds heard. Central nervous system: unable to assess with current sedation. Extremities: No cyanosis or clubbing.  No edema appreciated. Skin: No petechiae. Psychiatry: Unable to assess with current level of sedation   Data Reviewed: I have personally reviewed following labs and imaging studies  CBC: Recent Labs  Lab 04/14/2021 1838 04/17/21 0410 04/20/21 0744  WBC 9.3 5.1 5.7  HGB 13.3 12.2* 12.3*  HCT 41.1 38.1* 39.2  MCV 92.8 91.4 93.8  PLT 258 250 161    Basic Metabolic Panel: Recent Labs  Lab 04/22/2021 1838 04/17/21 0410 04/18/21 0407 04/20/21 0528  NA 137 135  --  140  K 4.1 3.3*  --  4.2  CL 78* 78*  --  94*  CO2 47* 43*  --  39*  GLUCOSE 130* 127*  --  147*  BUN 12 15  --  26*  CREATININE 0.55* 0.57*  --  0.42*  CALCIUM 9.1 9.2  --  8.5*  MG  --   --  2.5*  --     GFR: Estimated Creatinine Clearance: 101.4 mL/min (A) (by C-G formula based on SCr of 0.42 mg/dL (L)).  Liver Function Tests: Recent Labs  Lab 04/17/21 0410  AST 26  ALT 23  ALKPHOS 93  BILITOT  0.6  PROT 6.9  ALBUMIN 3.3*    Recent Results (from the past 240 hour(s))  Resp Panel by RT-PCR (Flu A&B, Covid) Nasopharyngeal Swab     Status: Abnormal   Collection Time: 03/31/2021  6:13 PM   Specimen: Nasopharyngeal Swab; Nasopharyngeal(NP) swabs in vial transport medium  Result Value Ref Range Status   SARS Coronavirus 2 by RT PCR POSITIVE (A) NEGATIVE Final    Comment: RESULT CALLED TO, READ BACK BY AND VERIFIED WITH: K BELTON,RN@2133  04/15/2021 MKELLY (NOTE) SARS-CoV-2 target nucleic acids are DETECTED.  The SARS-CoV-2 RNA is generally detectable in upper respiratory specimens during the acute phase of infection. Positive results are indicative of the presence of the identified virus, but do not rule out bacterial infection or co-infection with other pathogens not detected by the test. Clinical correlation with patient history and other diagnostic information is necessary to determine patient infection status. The expected result is Negative.  Fact Sheet for Patients: EntrepreneurPulse.com.au  Fact Sheet for Healthcare Providers: IncredibleEmployment.be  This test is not yet approved or cleared by the [place] FDA and  has been authorized for detection and/or diagnosis of SARS-CoV-2 by FDA under an Emergency Use Authorization (EUA).  This EUA will  remain in effect (meaning this test can be  used) for the duration of  the COVID-19 declaration under Section 564(b)(1) of the Act, 21 U.S.C. section 360bbb-3(b)(1), unless the authorization is terminated or revoked sooner.     Influenza A by PCR NEGATIVE NEGATIVE Final   Influenza B by PCR NEGATIVE NEGATIVE Final    Comment: (NOTE) The Xpert Xpress SARS-CoV-2/FLU/RSV plus assay is intended as an aid in the diagnosis of influenza from Nasopharyngeal swab specimens and should not be used as a sole basis for treatment. Nasal washings and aspirates are unacceptable for Xpert Xpress  SARS-CoV-2/FLU/RSV testing.  Fact Sheet for Patients: EntrepreneurPulse.com.au  Fact Sheet for Healthcare Providers: IncredibleEmployment.be  This test is not yet approved or cleared by the [place] FDA and has been authorized for detection and/or diagnosis of SARS-CoV-2 by FDA under an Emergency Use Authorization (EUA). This EUA will remain in effect (meaning this test can be used) for the duration of the COVID-19 declaration under Section 564(b)(1) of the Act, 21 U.S.C. section 360bbb-3(b)(1), unless the authorization is terminated or revoked.  Performed at [facility], [address], [place], [place] [address]   MRSA PCR Screening     Status: None   Collection Time: 04/17/21 12:46 AM   Specimen: Nasal Mucosa; Nasopharyngeal  Result Value Ref Range Status   MRSA by PCR NEGATIVE NEGATIVE Final    Comment:        The GeneXpert MRSA Assay (FDA approved for NASAL specimens only), is one component of a comprehensive MRSA colonization surveillance program. It is not intended to diagnose MRSA infection nor to guide or monitor treatment for MRSA infections. Performed at [facility], [address], [place], [place] [address]      Radiology Studies: [facility] Chest [facility] [place] 1 View  Result Date: 04/22/2021 CLINICAL DATA:  COVID-19 positive, intubated EXAM: PORTABLE CHEST 1 VIEW COMPARISON:  03/24/2021 FINDINGS: 2 frontal views of the chest demonstrate endotracheal tube overlying tracheal air column tip midway between thoracic inlet and carina. Enteric catheter passes below diaphragm tip and side port projecting over gastric fundus. The cardiac silhouette is stable. Diffuse emphysema again noted. Patchy consolidation is seen within the right suprahilar region and within the medial left lung base, which could reflect multifocal pneumonia. Trace bilateral pleural effusions. No pneumothorax. No acute displaced fractures. IMPRESSION: 1. Support devices as  above. 2. Patchy right upper and left lower lobe airspace disease superimposed upon background emphysema, consistent with multifocal pneumonia. 3. Trace bilateral pleural effusions. Electronically Signed   By: Randa Ngo M.D.   On: 04/22/2021 03:47   Scheduled Meds: . arformoterol  15 mcg Nebulization BID  . budesonide (PULMICORT) nebulizer solution  0.25 mg Nebulization BID  . chlorhexidine gluconate (MEDLINE KIT)  15 mL Mouth Rinse BID  . Chlorhexidine Gluconate Cloth  6 each Topical Daily  . cloNIDine  0.1 mg Transdermal Weekly  . enoxaparin (LOVENOX) injection  40 mg Subcutaneous Q24H  . folic acid  1 mg Intravenous Daily  . furosemide  20 mg Intravenous Daily  . mouth rinse  15 mL Mouth Rinse 10 times per day  . methylPREDNISolone (SOLU-MEDROL) injection  40 mg Intravenous Q12H  . nicotine  21 mg Transdermal Daily  . pantoprazole (PROTONIX) IV  40 mg Intravenous Q24H  . revefenacin  175 mcg Nebulization Daily  . thiamine injection  100 mg Intravenous Daily   Continuous Infusions: . dexmedetomidine (PRECEDEX) IV infusion 0.4 mcg/kg/hr (04/22/21 0951)  . dextrose 5 % and 0.9 %  NaCl with KCl 20 mEq/L 50 mL/hr at 04/22/21 0703  . doxycycline (VIBRAMYCIN) IV 100 mg (04/22/21 1008)  . fentaNYL infusion INTRAVENOUS 150 mcg/hr (04/22/21 1632)  . valproate sodium 500 mg (04/22/21 1509)     LOS: 6 days    Time spent: 35 minutes   Barton Dubois, MD [facility]   To contact the attending provider between 7A-7P or the covering provider during after hours 7P-7A, please log into the web site [email or web address] and access using universal [place] password for that web site. If you do not have the password, please call the hospital operator.  04/22/2021, 4:57 PM

## 2021-04-22 NOTE — Progress Notes (Signed)
Patient intubated for increased working to breathe and agitation uncontrolled throughout the shift.  Patient given CHG rinse prior to intubation.  CHG bath already done within less than 6 hours prior as well.  Patient given 100 mg Succinylcholine and 20 mg Etomidate via IVP prior to intubation by ED MD with ED CN assisting.  Patient was started on Fentanyl for sedation.

## 2021-04-22 NOTE — Progress Notes (Signed)
eLink Physician-Brief Progress Note Patient Name: Jeffrey [name] DOB: February 05, 1961 MRN: [id number]   Date of Service  04/22/2021  HPI/Events of Note  Patient intubated by ED physician.   eICU Interventions  Plan: 1. Ventilator orders: 40%/PRVC 20/TV 580/P 5. 2. Portable CXR STAT. 3. ABG at 5 AM. 4. Fentanyl IV infusion. Titrate to RASS = 0 to -1.      Intervention Category Major Interventions: Respiratory failure - evaluation and management  Lenix Kidd [place] 04/22/2021, 3:21 AM

## 2021-04-22 NOTE — Progress Notes (Signed)
eLink Physician-Brief Progress Note Patient Name: Jeffrey [name] DOB: 1961/10/23 MRN: [id number]   Date of Service  04/22/2021  HPI/Events of Note  Multiple issues: 1. ETT in mid trachea. 2. ABG on 40%/PRVC 20/TV 580//P 5 = 7.306/98.9/190/41.9  eICU Interventions  Plan: 1. Increase   PRVC rate to  22.  2. Repeat ABG at 7:30 AM.     Intervention Category Major Interventions: Respiratory failure - evaluation and management;Acid-Base disturbance - evaluation and management  Lulamae Skorupski [place] 04/22/2021, 4:39 AM

## 2021-04-22 NOTE — Progress Notes (Signed)
NAME:  Jeffrey [name], MRN:  [id number], DOB:  Sep 19, 1961, LOS: 6 ADMISSION DATE:  04/19/2021, CONSULTATION DATE:   04/17/2021 REFERRING MD:  Augusto Gamble, CHIEF COMPLAINT: Respiratory distress on BiPAP  History of Present Illness:  60 year old reportedly quit smoking 12/2020  with known COPD III spirometric criteria plus mod severe Mitral stenosis   and chronic hypoxemic and hypercarbic  respiratory failure on 3 L oxygen, patient of Dr. Melvyn Novas admitted with shortness of breath ongoing x several days not relieved with short acting bronchodilators at home.  He reported nonproductive cough.  In the ED he was confused, ABG showed 7.30/112, COVID testing was positive. He is vaccinated and boosted, it was felt that he was more likely having a COPD exacerbation rather than infective COVID-pneumonia, he was placed on BiPAP, started on remdesivir and steroids. 5/26 increased agitation, not keeping BiPAP on, RN concerned about EtOH withdrawal, PCCM consulted emergently  Pertinent  Medical History  Chronic diastolic heart failure. 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 5/26 increased agitation , not tolerating BiPAP . 5/27 remains on precedex . 5/30 overdsedated on precedex 0.5  > rec taper and try clonidine if bp will tolerate and if not d/c bisoprolol  . ET 5/30      Micro: COVID  19  PCR  5/25  POS MRSA PCR  5/26   Neg  ET 5/31 >>>   ID Rx remdesovir  5/25 >>> Doxy 5/26 >>>    Scheduled Meds: . arformoterol  15 mcg Nebulization BID  . budesonide (PULMICORT) nebulizer solution  0.25 mg Nebulization BID  .  chlorhexidine gluconate (MEDLINE KIT)  15 mL Mouth Rinse BID  . Chlorhexidine Gluconate Cloth  6 each Topical Daily  . cloNIDine  0.1 mg Transdermal Weekly  . enoxaparin (LOVENOX) injection  40 mg Subcutaneous Q24H  . folic acid  1 mg Intravenous Daily  . furosemide  20 mg Intravenous Daily  . mouth rinse  15 mL Mouth Rinse 10 times per day  . methylPREDNISolone (SOLU-MEDROL) injection  40 mg Intravenous Q12H  . nicotine  21 mg Transdermal Daily  . pantoprazole (PROTONIX) IV  40 mg Intravenous Q24H  . revefenacin  175 mcg Nebulization Daily  . thiamine injection  100 mg Intravenous Daily   Continuous Infusions: . dexmedetomidine (PRECEDEX) IV infusion 0.4 mcg/kg/hr (04/22/21 0951)  . dextrose 5 % and 0.9 % NaCl with KCl 20 mEq/L 50 mL/hr at 04/22/21 0703  . doxycycline (VIBRAMYCIN) IV 100 mg (04/22/21 1008)  . fentaNYL infusion INTRAVENOUS 200 mcg/hr (04/22/21 0703)  . valproate sodium Stopped (04/22/21 0631)   PRN Meds:.albuterol, haloperidol lactate, LORazepam, midazolam, midazolam   Interim History / Subjective:   required intubation for agitation refractory to haldol pm 5/31   Objective   Blood pressure (!) 81/41, pulse 80, temperature 98.8 F (37.1 C), temperature source Axillary, resp. rate (!) 22, height 5' 10"  (1.778 m), weight 86.4 kg, SpO2 96 %.    Vent Mode: PRVC FiO2 (%):  [40 %-55 %] 40 % Set Rate:  [18 bmp-22 bmp] 22 bmp Vt Set:  [580 mL] 580 mL PEEP:  [5 cmH20-6 cmH20] 5 cmH20 Plateau Pressure:  [  26 cmH20-31 cmH20] 31 cmH20   Intake/Output Summary (Last 24 hours) at 04/22/2021 1349 Last data filed at 04/22/2021 0703 Gross per 24 hour  Intake 1842.22 ml  Output 1900 ml  Net -57.78 ml   Filed Weights   03/23/2021 1751 04/21/21 0413  Weight: 83.9 kg 86.4 kg    Examination: Elderly wm sedated on vent  Tmax  99.9  No jvd Oropharynx ET Neck supple Lungs with a few scattered exp > insp rhonchi bilaterally RRR no s3 or or sign murmur Abd soft nl excursion   Extr warm with no edema or clubbing noted Neuro  Was thrashing all 4 prior to intubation      I personally reviewed images and agree with radiology impression as follows:  CXR:   5/31 portable 1. Support devices as above. 2. Patchy right upper and left lower lobe airspace disease superimposed upon background emphysema, consistent with multifocal pneumonia. 3. Trace bilateral pleural effusions.    Resolved Hospital Problem list     Assessment & Plan:   Delirium tremens from alcohol withdrawal. Hx of bipolar disease.  - valproic acid 500 mg IV q8h until able to swallow pills - continue to hold outpt prozac, neurontin, zyprexa, trazodone, B12 until able to swallow pills - added atapres patch  as a way of getting off precedex infusion  >>> rx precedex/ fentanyl drips  Acute on chronic hypoxic, hypercapnic respiratory failure. COPD exacerbation -  Vent dep as as 5/30 with air trapping problematic  >>> reduce vent rate for air trapping/ alkalosis on last abg    COVID 19 positive s/p reported vax x 3  -  Relatively mild COVID 19 pna likely  rx per [facility]= remdesivir  And weaning  Steroids with TME that might be partly steroid induced psychosis   Hx of HTN, [name], Mitral stenois  - hold outpt aspirin, lipitor,  cardizem, lasix until he is able to swallow pills  >>> catapres TTS  0.1 mg   Hypokalemia,resolved  - f/u BMET  Best practice (right click and "Reselect all SmartList Selections" daily)  Diet:  NPO Pain/Anxiety/Delirium protocol (if indicated): Yes (RASS goal 0) VAP protocol (if indicated): Yes DVT prophylaxis: LMWH GI prophylaxis: PPI Glucose control:  SSI No Central venous access:  N/A Arterial line:  N/A Foley:  N/A Mobility:  bed rest  PT consulted: N/A Last date of multidisciplinary goals of care discussion [per [facility]] - I would favor NCB status here. Code Status:  full code Disposition: ICU  Labs    CMP Latest Ref Rng & Units 04/20/2021 04/17/2021  04/07/2021  Glucose 70 - 99 mg/dL 147(H) 127(H) 130(H)  BUN 6 - 20 mg/dL 26(H) 15 12  Creatinine 0.61 - 1.24 mg/dL 0.42(L) 0.57(L) 0.55(L)  Sodium 135 - 145 mmol/L 140 135 137  Potassium 3.5 - 5.1 mmol/L 4.2 3.3(L) 4.1  Chloride 98 - 111 mmol/L 94(L) 78(L) 78(L)  CO2 22 - 32 mmol/L 39(H) 43(H) 47(H)  Calcium 8.9 - 10.3 mg/dL 8.5(L) 9.2 9.1  Total Protein 6.5 - 8.1 g/dL - 6.9 -  Total Bilirubin 0.3 - 1.2 mg/dL - 0.6 -  Alkaline Phos 38 - 126 U/L - 93 -  AST 15 - 41 U/L - 26 -  ALT 0 - 44 U/L - 23 -    CBC Latest Ref Rng & Units 04/20/2021 04/17/2021 03/28/2021  WBC 4.0 - 10.5 K/uL 5.7 5.1 9.3  Hemoglobin 13.0 - 17.0 g/dL 12.3(L) 12.2(L) 13.3  Hematocrit 39.0 - 52.0 % 39.2  38.1(L) 41.1  Platelets 150 - 400 K/uL 195 250 258    ABG    Component Value Date/Time   PHART 7.487 (H) 04/22/2021 0833   PCO2ART 59.1 (H) 04/22/2021 0833   PO2ART 130 (H) 04/22/2021 0833   HCO3 42.0 (H) 04/22/2021 0833   TCO2 34 (H) 12/21/2018 0357   ACIDBASEDEF 0.6 02/17/2019 1850   O2SAT 98.9 04/22/2021 0833    CBG (last 3)  Recent Labs    04/22/21 0752 04/22/21 1130  GLUCAP 123* 133*     The patient is critically ill with multiple organ systems failure and requires high complexity decision making for assessment and support, frequent evaluation and titration of therapies, application of advanced monitoring technologies and extensive interpretation of multiple databases. Critical Care Time devoted to patient care services described in this note is 45 minutes.    Christinia Gully, MD Pulmonary and [place] [phone]   After 7:00 pm call Elink  [phone]

## 2021-04-22 NOTE — Progress Notes (Signed)
eLink Physician-Brief Progress Note Patient Name: Jeffrey [name] DOB: [birth year] MRN: [id number]   Date of Service  04/22/2021  HPI/Events of Note  Patient continues to experience increased WOB. Sat = 89% and RR = 30-35. HR 139, using accessory muscles and delirium with increased agitation. Per Dr. Thurston Hole note, the patient is not a candidate for BiPAP d/t altered mental status. He will require intubation and mechanical ventilation.   eICU Interventions  Plan: 1. Intubate patient. 2. Bedside nurse and hospitalist to arrange to have ED physician intubate the patient.      Intervention Category Major Interventions: Respiratory failure - evaluation and management  Rakeisha Nyce Eugene 04/22/2021, 2:32 AM

## 2021-04-22 NOTE — Progress Notes (Signed)
Initial Nutrition Assessment  DOCUMENTATION CODES:   Not applicable  INTERVENTION:  If patient unable to wean tomorrow- recommend:  Initiate Vital 1.2 @ 40 ml/hr via NGT and increase by 10 ml every 6 hours to goal rate of 60 ml/hr.   Add ProSource TF- 45 ml-BID  Tube feeding regimen provides 1808 kcal, 130 grams of protein, and 1168 ml of H2O.     NUTRITION DIAGNOSIS:   Inadequate oral intake related to inability to eat as evidenced by NPO status.   GOAL:  Provide needs based on ASPEN/SCCM guidelines    MONITOR:  Vent status,Labs,I & O's,TF tolerance,Weight trends    REASON FOR ASSESSMENT:   Ventilator    ASSESSMENT: Patient is a  61 yo male with hx of COPD (chronic home O2 @ 3-4 L), HF, HTN, Pneumonia [name] and alcohol use. Presents with COPD exacerbation and COVID-19 pneumonia.    Patient intubated on ventilator support since this morning- not tolerating BiPAP. Talked with nursing. No plan for weaning at this time. MV: 11.6  L/min Temp (24hrs), Avg:98.7 F (37.1 C), Min:97.4 F (36.3 C), Max:99.9 F (37.7 C)  Precedex, Fentanyl: sedation   Intake/Output Summary (Last 24 hours) at 04/22/2021 1408 Last data filed at 04/22/2021 1400 Gross per 24 hour  Intake 2543.81 ml  Output 1050 ml  Net 1493.81 ml  Since admission + 2.4 liters. Patient weight is increased 2.5 kg this month.   IVF: D5@ 50 ml/hr   Medications: lasix, folic acid, B-1, Protonix, Solumedrol, Thiamine.   Labs: BMP Latest Ref Rng & Units 04/20/2021 04/17/2021 Apr 20, 2021  Glucose 70 - 99 mg/dL 882(C) 003(K) 917(H)  BUN 6 - 20 mg/dL 15(A) 15 12  Creatinine 0.61 - 1.24 mg/dL 5.69(V) 9.48(A) 1.65(V)  Sodium 135 - 145 mmol/L 140 135 137  Potassium 3.5 - 5.1 mmol/L 4.2 3.3(L) 4.1  Chloride 98 - 111 mmol/L 94(L) 78(L) 78(L)  CO2 22 - 32 mmol/L 39(H) 43(H) 47(H)  Calcium 8.9 - 10.3 mg/dL 3.7(S) 9.2 9.1     NUTRITION - FOCUSED PHYSICAL EXAM: Unable to complete Nutrition-Focused physical exam at this  time.    Diet Order:   Diet Order            Diet NPO time specified  Diet effective now                 EDUCATION NEEDS:  Not appropriate for education at this time  Skin:  Skin Assessment: Reviewed RN Assessment  Last BM:  5/31  Height:   Ht Readings from Last 1 Encounters:  Apr 20, 2021 5\' 10"  (1.778 m)    Weight:   Wt Readings from Last 1 Encounters:  04/21/21 86.4 kg    Ideal Body Weight:   75 kg  BMI:  Body mass index is 27.33 kg/m.  Estimated Nutritional Needs:   Kcal:  1945  Protein:  115-129 gr  Fluid:  1.9-2.0 Liters daily   04/23/21 MS,RD,CSG,LDN Contact: [email or web address]

## 2021-04-22 NOTE — Progress Notes (Signed)
eLink Physician-Brief Progress Note Patient Name: Jeffrey [name] DOB: [birth year] MRN: [id number]   Date of Service  04/22/2021  HPI/Events of Note  ABG on 40%/PRVC 22/TV 580/P 5 = 7.44/67.2/77/42.4. The pCO2 has decreased and pH normalized since last ABG. Patient still bucking the ventilator.   eICU Interventions  Plan: 1. Increase ceiling on Fentanyl IV infusion to 400 mcg/hour. 2. Continue present ventilator settings.      Intervention Category Major Interventions: Respiratory failure - evaluation and management  [name] 04/22/2021, 6:01 AM

## 2021-04-23 ENCOUNTER — Encounter (HOSPITAL_COMMUNITY): Payer: Self-pay | Admitting: Internal Medicine

## 2021-04-23 DIAGNOSIS — U071 COVID-19: Secondary | ICD-10-CM

## 2021-04-23 DIAGNOSIS — J9622 Acute and chronic respiratory failure with hypercapnia: Secondary | ICD-10-CM | POA: Diagnosis not present

## 2021-04-23 DIAGNOSIS — Z7189 Other specified counseling: Secondary | ICD-10-CM | POA: Diagnosis not present

## 2021-04-23 DIAGNOSIS — J9601 Acute respiratory failure with hypoxia: Secondary | ICD-10-CM | POA: Diagnosis not present

## 2021-04-23 DIAGNOSIS — Z515 Encounter for palliative care: Secondary | ICD-10-CM | POA: Diagnosis not present

## 2021-04-23 DIAGNOSIS — I5032 Chronic diastolic (congestive) heart failure: Secondary | ICD-10-CM | POA: Diagnosis not present

## 2021-04-23 DIAGNOSIS — G9341 Metabolic encephalopathy: Secondary | ICD-10-CM | POA: Diagnosis not present

## 2021-04-23 DIAGNOSIS — F17210 Nicotine dependence, cigarettes, uncomplicated: Secondary | ICD-10-CM | POA: Diagnosis not present

## 2021-04-23 DIAGNOSIS — J441 Chronic obstructive pulmonary disease with (acute) exacerbation: Secondary | ICD-10-CM | POA: Diagnosis not present

## 2021-04-23 LAB — BASIC METABOLIC PANEL
Anion gap: 9 (ref 5–15)
BUN: 29 mg/dL — ABNORMAL HIGH (ref 6–20)
CO2: 37 mmol/L — ABNORMAL HIGH (ref 22–32)
Calcium: 8.6 mg/dL — ABNORMAL LOW (ref 8.9–10.3)
Chloride: 96 mmol/L — ABNORMAL LOW (ref 98–111)
Creatinine, Ser: 0.64 mg/dL (ref 0.61–1.24)
GFR, Estimated: >60 mL/min (ref >60)
Glucose, Bld: 114 mg/dL — ABNORMAL HIGH (ref 70–99)
Potassium: 3.6 mmol/L (ref 3.5–5.1)
Sodium: 142 mmol/L (ref 135–145)

## 2021-04-23 LAB — CBC
HCT: 36.8 % — ABNORMAL LOW (ref 39.0–52.0)
Hemoglobin: 11.4 g/dL — ABNORMAL LOW (ref 13.0–17.0)
MCH: 29 pg (ref 26.0–34.0)
MCHC: 31 g/dL (ref 30.0–36.0)
MCV: 93.6 fL (ref 80.0–100.0)
Platelets: 157 10*3/uL (ref 150–400)
RBC: 3.93 MIL/uL — ABNORMAL LOW (ref 4.22–5.81)
RDW: 12.3 % (ref 11.5–15.5)
WBC: 6 10*3/uL (ref 4.0–10.5)
nRBC: 0.3 % — ABNORMAL HIGH (ref 0.0–0.2)

## 2021-04-23 LAB — GLUCOSE, CAPILLARY
Glucose-Capillary: 127 mg/dL — ABNORMAL HIGH (ref 70–99)
Glucose-Capillary: 133 mg/dL — ABNORMAL HIGH (ref 70–99)
Glucose-Capillary: 134 mg/dL — ABNORMAL HIGH (ref 70–99)
Glucose-Capillary: 136 mg/dL — ABNORMAL HIGH (ref 70–99)
Glucose-Capillary: 137 mg/dL — ABNORMAL HIGH (ref 70–99)
Glucose-Capillary: 151 mg/dL — ABNORMAL HIGH (ref 70–99)
Glucose-Capillary: 151 mg/dL — ABNORMAL HIGH (ref 70–99)

## 2021-04-23 LAB — PROCALCITONIN: Procalcitonin: <0.1 ng/mL

## 2021-04-23 LAB — BLOOD GAS, ARTERIAL
Acid-Base Excess: 15 mmol/L — ABNORMAL HIGH (ref 0.0–2.0)
Bicarbonate: 38.2 mmol/L — ABNORMAL HIGH (ref 20.0–28.0)
FIO2: 30
O2 Saturation: 92.8 %
Patient temperature: 37
pCO2 arterial: 47.7 mmHg (ref 32.0–48.0)
pH, Arterial: 7.525 — ABNORMAL HIGH (ref 7.350–7.450)
pO2, Arterial: 60.8 mmHg — ABNORMAL LOW (ref 83.0–108.0)

## 2021-04-23 LAB — RESP PANEL BY RT-PCR (FLU A&B, COVID) ARPGX2
Influenza A by PCR: NEGATIVE
Influenza B by PCR: NEGATIVE
SARS Coronavirus 2 by RT PCR: NEGATIVE

## 2021-04-23 LAB — TSH: TSH: 0.855 u[IU]/mL (ref 0.350–4.500)

## 2021-04-23 LAB — MAGNESIUM: Magnesium: 2 mg/dL (ref 1.7–2.4)

## 2021-04-23 MED ORDER — BARICITINIB 2 MG PO TABS: 4.0000 mg | ORAL_TABLET | Freq: Every day | ORAL | Status: DC

## 2021-04-23 MED ORDER — CLONAZEPAM 0.1 MG/ML ORAL SUSPENSION: 1.0000 mg | Freq: Two times a day (BID) | ORAL | Status: DC

## 2021-04-23 MED ORDER — BARICITINIB 2 MG PO TABS
4.0000 mg | ORAL_TABLET | Freq: Every day | ORAL | Status: DC
  Administered 2021-04-24: 4 mg
  Filled 2021-04-23: qty 2

## 2021-04-23 MED ORDER — THIAMINE HCL 100 MG/ML IJ SOLN
500.0000 mg | Freq: Three times a day (TID) | INTRAVENOUS | Status: DC
  Administered 2021-04-23 (×2): 500 mg via INTRAVENOUS
  Filled 2021-04-23 (×6): qty 5

## 2021-04-23 MED ORDER — VITAL AF 1.2 CAL PO LIQD
1000.0000 mL | ORAL | Status: DC
  Administered 2021-04-23: 1000 mL

## 2021-04-23 MED ORDER — CLONAZEPAM 0.25 MG PO TBDP
1.0000 mg | ORAL_TABLET | Freq: Two times a day (BID) | ORAL | Status: DC
  Administered 2021-04-23 (×2): 1 mg
  Filled 2021-04-23 (×2): qty 4

## 2021-04-23 NOTE — Progress Notes (Signed)
PROGRESS NOTE  Jeffrey [name] ESP:[id number] DOB: 12-07-1960 DOA: 04/05/2021 PCP: Jeffrey Evens, MD  Brief History:  As per H&P written by Dr. Waldron [name]  [name] a59 y.o.male,with medical history significant forhypertension, hyperlipidemia, COPD(on 3-4 LPM of supplemental oxygen via [place] at baseline), CHF, GERD,as well on home BiPAP machine, with very poor compliance, oriented, who presents to ED secondary to shortness of breath, reports he started to feel worsening shortness of breath today, where he had to call EMS, as his home nebulizer machine has not been helping, he was given albuterol, Atrovent, as well Solu-Medrol, he reports improvement of his symptoms after EMS treatment, he denies any fever, chills, nausea, vomiting, no abdominal pain, no COVID exposures, no chest pain, no leg swelling, reports he received his COVID-vaccine and booster. -in EDpatient is mildly confused, but awake, ABG showing PCO2 retention of 112, pH of 7.3, his COVID-19 test was positive, his chest x-ray chronic some chronic interstitial changes and COPD, with possible superimposed atypical infection, [facility]ist consulted to admit.  5/26 increased agitation, not keeping BiPAP on, RN concerned about EtOH withdrawal, PCCM consulted emergently.  Intubated early am 04/22/21   5/26 increased agitation , not tolerating BiPAP  5/27 remains on precedex  5/30 overdsedated on precedex 0.5  > rec taper and try clonidine if bp will tolerate and if not d/c bisoprolol   ET 5/31 >>>     Micro: COVID  19  PCR  5/25  POS MRSA PCR  5/26   Neg  ET 5/31 >>>   ID Rx remdesivir  5/25 >>>5/28 Doxy 5/26 >>>   Assessment/Plan: acute on chronic hypoxic and hypercarbic respiratory failure secondary to COPD exacerbation and COVID-19 pneumonia -Chest x-ray is also raising concern for component of bronchiectasis/bacterial infection; will continue doxycycline; 2 more days left to complete  antibiotic therapy. -Competed treatment with remdesivir and continue receiving steroids tapering. -Continue bronchodilators/nebulizer management as per PCCM recommendations.  -Currently intubated and mechanically ventilated; will follow PCCM assistance and recommendation. -Palliative care consulted; per PCCM note CODE BLUE status favored. -Most recent ABG demonstrating PH 7.487 CO2 of 59.1and a PO2 of 130.  -finished 4 days remdesivir -continue IV steroids -check PCT -start baricitinib -start enteral feeding  alcohol abuse with withdrawal symptoms/delirium tremens/bipolar disorder -Looking for a RASS score of 0 -Continue as needed anxiolytic and antipsychotic medications. -Now that the patient is intubated decision has been made to use Precedex (0.6)  along with fentanyl (175 mcg) drips. 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 -will continue nicotine patch.  Chronic diastolic CHF (congestive heart failure) (Jeffrey [name]) -appears Compensated currently -Continue home dose of Lasix, IV now while unable to take PO's. -continue Daily weights-strict I's and O's. -10/04/20 Echo--EF 55-60%, no WMA  Essential hypertension -Overall stable and well-controlled. -Continue to follow vital signs and treat accordingly. -Continue clonidine patch.  hypokalemia -repleted -check mag       Status is: Inpatient  Remains inpatient appropriate because:Persistent severe electrolyte disturbances and Inpatient level of care appropriate due to severity of illness   Dispo: The patient is from: Home              Anticipated d/c is to: Home              Patient currently is not medically stable to d/c.   Difficult to place patient No  Family Communication:   Daughter updated 6/1  Consultants:  PCCM  Code Status:  FULL   DVT Prophylaxis:  [place]  Lovenox   Procedures: As Listed in Progress Note Above  Antibiotics: Doxy 5/26     Subjective: Patient is intubated and sedated.  No reports of vomiting, diarrhea, resp distress  Objective: Vitals:   04/23/21 0400 04/23/21 0600 04/23/21 0700 04/23/21 1200  BP: 128/64 (!) 113/50 (!) 115/56 126/63  Pulse: 81 85 73 80  Resp: (!) 0 18 16 16   Temp: 98.6 F (37 C)     TempSrc: Axillary     SpO2: 97% 100% 96% 92%  Weight:      Height:        Intake/Output Summary (Last 24 hours) at 04/23/2021 1331 Last data filed at 04/23/2021 1259 Gross per 24 hour  Intake 2772.81 ml  Output 200 ml  Net 2572.81 ml   Weight change:  Exam:   General:  Pt sedated on vent  HEENT: No icterus, No thrush, No neck mass, [place]/AT  Cardiovascular: RRR, S1/S2, no rubs, no gallops  Respiratory: bibasilar rales. No wheeze  Abdomen: Soft/+BS, non tender, non distended, no guarding  Extremities: No edema, No lymphangitis, No petechiae, No rashes, no synovitis   Data Reviewed: I have personally reviewed following [name] and imaging studies Basic Metabolic Panel: Recent [name]  Lab 04/12/2021 1838 04/17/21 0410 04/18/21 0407 04/20/21 0528 04/23/21 0444  NA 137 135  --  140 142  K 4.1 3.3*  --  4.2 3.6  CL 78* 78*  --  94* 96*  CO2 47* 43*  --  39* 37*  GLUCOSE 130* 127*  --  147* 114*  BUN 12 15  --  26* 29*  CREATININE 0.55* 0.57*  --  0.42* 0.64  CALCIUM 9.1 9.2  --  8.5* 8.6*  MG  --   --  2.5*  --  2.0   Liver Function Tests: Recent [name]  Lab 04/17/21 0410  AST 26  ALT 23  ALKPHOS 93  BILITOT 0.6  PROT 6.9  ALBUMIN 3.3*   No results for input(s): LIPASE, AMYLASE in the last 168 hours. No results for input(s): AMMONIA in the last 168 hours. Coagulation Profile: No results for input(s): INR, PROTIME in the last 168 hours. CBC: Recent [name]  Lab 04/19/2021 1838 04/17/21 0410 04/20/21 0744 04/23/21 0444  WBC 9.3 5.1 5.7 6.0  HGB 13.3 12.2* 12.3* 11.4*  HCT 41.1 38.1* 39.2  36.8*  MCV 92.8 91.4 93.8 93.6  PLT 258 250 195 157   Cardiac Enzymes: No results for input(s): CKTOTAL, CKMB, CKMBINDEX, TROPONINI in the last 168 hours. BNP: Invalid input(s): POCBNP CBG: Recent [name]  Lab 04/22/21 2053 04/23/21 0225 04/23/21 0538 04/23/21 0836 04/23/21 1155  GLUCAP 118* 134* 137* 127* 136*   HbA1C: No results for input(s): HGBA1C in the last 72 hours. Urine analysis:    Component Value Date/Time   COLORURINE YELLOW 09/29/2020 0340   APPEARANCEUR CLEAR 09/29/2020 0340   LABSPEC 1.009 09/29/2020 0340   PHURINE 7.0 09/29/2020 0340   GLUCOSEU NEGATIVE 09/29/2020 0340   HGBUR MODERATE (A) 09/29/2020 0340   BILIRUBINUR NEGATIVE 09/29/2020 0340   KETONESUR NEGATIVE 09/29/2020 0340   PROTEINUR NEGATIVE 09/29/2020 0340   UROBILINOGEN 0.2 03/01/2011 1427   NITRITE NEGATIVE 09/29/2020 0340   LEUKOCYTESUR NEGATIVE 09/29/2020 0340   Sepsis [name]: @LABRCNTIP (procalcitonin:4,lacticidven:4) ) Recent Results (from the past 240 hour(s))  Resp Panel by RT-PCR (Flu A&B, Covid) Nasopharyngeal Swab  Status: Abnormal   Collection Time: 03/23/2021  6:13 PM   Specimen: Nasopharyngeal Swab; Nasopharyngeal(NP) swabs in vial transport medium  Result Value Ref Range Status   SARS Coronavirus 2 by RT PCR POSITIVE (A) NEGATIVE Final    Comment: RESULT CALLED TO, READ BACK BY AND VERIFIED WITH: K BELTON,RN@2133  04/15/2021 MKELLY (NOTE) SARS-CoV-2 target nucleic acids are DETECTED.  The SARS-CoV-2 RNA is generally detectable in upper respiratory specimens during the acute phase of infection. Positive results are indicative of the presence of the identified virus, but do not rule out bacterial infection or co-infection with other pathogens not detected by the test. Clinical correlation with patient history and other diagnostic information is necessary to determine patient infection status. The expected result is Negative.  Fact Sheet for  Patients: EntrepreneurPulse.com.au  Fact Sheet for Healthcare Providers: IncredibleEmployment.be  This test is not yet approved or cleared by the [place] FDA and  has been authorized for detection and/or diagnosis of SARS-CoV-2 by FDA under an Emergency Use Authorization (EUA).  This EUA will remain in effect (meaning this test can be  used) for the duration of  the COVID-19 declaration under Section 564(b)(1) of the Act, 21 U.S.C. section 360bbb-3(b)(1), unless the authorization is terminated or revoked sooner.     Influenza A by PCR NEGATIVE NEGATIVE Final   Influenza B by PCR NEGATIVE NEGATIVE Final    Comment: (NOTE) The Xpert Xpress SARS-CoV-2/FLU/RSV plus assay is intended as an aid in the diagnosis of influenza from Nasopharyngeal swab specimens and should not be used as a sole basis for treatment. Nasal washings and aspirates are unacceptable for Xpert Xpress SARS-CoV-2/FLU/RSV testing.  Fact Sheet for Patients: EntrepreneurPulse.com.au  Fact Sheet for Healthcare Providers: IncredibleEmployment.be  This test is not yet approved or cleared by the [place] FDA and has been authorized for detection and/or diagnosis of SARS-CoV-2 by FDA under an Emergency Use Authorization (EUA). This EUA will remain in effect (meaning this test can be used) for the duration of the COVID-19 declaration under Section 564(b)(1) of the Act, 21 U.S.C. section 360bbb-3(b)(1), unless the authorization is terminated or revoked.  Performed at [facility], [address], [place], [place] [address]   MRSA PCR Screening     Status: None   Collection Time: 04/17/21 12:46 AM   Specimen: Nasal Mucosa; Nasopharyngeal  Result Value Ref Range Status   MRSA by PCR NEGATIVE NEGATIVE Final    Comment:        The GeneXpert MRSA Assay (FDA approved for NASAL specimens only), is one component of a comprehensive MRSA  colonization surveillance program. It is not intended to diagnose MRSA infection nor to guide or monitor treatment for MRSA infections. Performed at [facility], [address], [place], [place] [address]      Scheduled Meds: . arformoterol  15 mcg Nebulization BID  . baricitinib  4 mg Oral Daily  . budesonide (PULMICORT) nebulizer solution  0.25 mg Nebulization BID  . chlorhexidine gluconate (MEDLINE KIT)  15 mL Mouth Rinse BID  . Chlorhexidine Gluconate Cloth  6 each Topical Daily  . clonazepam  1 mg Per Tube BID  . cloNIDine  0.1 mg Transdermal Weekly  . enoxaparin (LOVENOX) injection  40 mg Subcutaneous Q24H  . folic acid  1 mg Intravenous Daily  . furosemide  20 mg Intravenous Daily  . mouth rinse  15 mL Mouth Rinse 10 times per day  . methylPREDNISolone (SOLU-MEDROL) injection  40 mg Intravenous Q12H  . nicotine  21 mg Transdermal Daily  . pantoprazole (PROTONIX) IV  40 mg Intravenous Q24H  . revefenacin  175 mcg Nebulization Daily   Continuous Infusions: . dexmedetomidine (PRECEDEX) IV infusion 0.6 mcg/kg/hr (04/23/21 1259)  . dextrose 5 % and 0.9 % NaCl with KCl 20 mEq/L 50 mL/hr at 04/23/21 1259  . doxycycline (VIBRAMYCIN) IV 125 mL/hr at 04/23/21 1259  . feeding supplement (VITAL AF 1.2 CAL)    . fentaNYL infusion INTRAVENOUS 175 mcg/hr (04/23/21 1259)  . thiamine injection    . valproate sodium Stopped (04/23/21 4604)    Procedures/Studies: DG Chest Port 1 View  Result Date: 04/22/2021 CLINICAL DATA:  COVID-19 positive, intubated EXAM: PORTABLE CHEST 1 VIEW COMPARISON:  03/25/2021 FINDINGS: 2 frontal views of the chest demonstrate endotracheal tube overlying tracheal air column tip midway between thoracic inlet and carina. Enteric catheter passes below diaphragm tip and side port projecting over gastric fundus. The cardiac silhouette is stable. Diffuse emphysema again noted. Patchy consolidation is seen within the right suprahilar region and within the medial left  lung base, which could reflect multifocal pneumonia. Trace bilateral pleural effusions. No pneumothorax. No acute displaced fractures. IMPRESSION: 1. Support devices as above. 2. Patchy right upper and left lower lobe airspace disease superimposed upon background emphysema, consistent with multifocal pneumonia. 3. Trace bilateral pleural effusions. Electronically Signed   By: Randa Ngo M.D.   On: 04/22/2021 03:47   DG Chest Port 1 View  Result Date: 04/03/2021 CLINICAL DATA:  Dyspnea, shortness of breath, diminished lung sounds EXAM: PORTABLE CHEST 1 VIEW COMPARISON:  CT 03/24/2021, radiograph 03/24/2021 FINDINGS: Coarsened reticulonodular opacities and bronchitic change with a basilar predominance, similar to comparison exam. No new consolidative opacity is seen. No pneumothorax or effusion. The aorta is calcified. The remaining cardiomediastinal contours are unremarkable. No acute osseous or soft tissue abnormality. Telemetry leads overlie the chest. IMPRESSION: Coarsened reticulonodular and bronchitic changes, most pronounced in the mid to lower lungs much of which could reflect some chronic interstitial changes and COPD though a superimposed atypical infectious or inflammatory process is not entirely excluded. Redemonstration of a pulmonary nodule in the mid left lung. Linear opacity in the left lung base, possible scarring or atelectasis. Aortic Atherosclerosis (ICD10-I70.0). Electronically Signed   By: Lovena Le M.D.   On: 03/26/2021 19:20    Orson Eva, DO  [facility]  If 7PM-7AM, please contact night-coverage [email or web address] Password [email or web address] 04/23/2021, 1:31 PM   LOS: 7 days

## 2021-04-23 NOTE — Consult Note (Signed)
Consultation Note Date: 04/23/2021   Patient Name: Jeffrey [name]  DOB: June 24, 1961  MRN: [id number]  Age / Sex: 60 y.o., male  PCP: Lemmie Evens, MD Referring Physician: Orson Eva, MD  Reason for Consultation: Establishing goals of care and Psychosocial/spiritual support  HPI/Patient Profile: 60 y.o. male  with past medical history of HTN/[name], COPD on 3-4 chronic oxygen, CHF, GERD, home BiPAP with poor compliance, arthritis, bipolar disorder, anxiety, PUD, [name] admitted on 03/24/2021 with acute on chronic hypoxic/hypercarbic respiratory failure secondary to COPD and COVID-pneumonia.   Clinical Assessment and Goals of Care: I have reviewed medical records including EPIC notes, labs and imaging, received report from RN, assessed the patient.  Jeffrey [name] is lying quietly in bed.  He is intubated/ventilated and sedated.  There are no visitors at this time due to [place] restrictions.   Call to his healthcare surrogate, daughter, Jeffrey [name] to discuss diagnosis prognosis, GOC, EOL wishes, disposition and options.  No answer, left somewhat detailed voicemail message.  PMT to continue to follow.  Conference with attending, bedside nursing staff related to patient condition, needs, goals of care.   HCPOA   NEXT OF KIN -daughter, Jeffrey [name] is main contact.  Jeffrey [name] father, Jeffrey [name] is still living also.  SUMMARY OF RECOMMENDATIONS   At this point full scope/full code Continue goals of care discussion   Code Status/Advance Care Planning:  Full code  Symptom Management:   Per hospitalist/CCM, no additional needs at this time.  Palliative Prophylaxis:   Frequent Pain Assessment and Oral Care  Additional Recommendations (Limitations, Scope, Preferences):  Full Scope Treatment  Psycho-social/Spiritual:   Desire for further Chaplaincy support:yes  Additional Recommendations: Caregiving   Support/Resources and ICU Family Guide  Prognosis:   Unable to determine, based on outcomes.  Very guarded at this point.  Discharge Planning: To be determined, based on outcomes.      Primary Diagnoses: Present on Admission: . (HFpEF) heart failure with preserved ejection fraction ([place]) . Acute on chronic diastolic CHF (congestive heart failure) ([place]) . Acute metabolic encephalopathy . Acute on chronic respiratory failure with hypercapnia ([place]) . Chronic diastolic CHF (congestive heart failure) ([place]) . Cigarette smoker . COPD exacerbation ([place]) . Essential hypertension . Acute respiratory failure with hypercapnia ([place])   I have reviewed the medical record, interviewed the patient and family, and examined the patient. The following aspects are pertinent.  Past Medical History:  Diagnosis Date  . Acute blood loss anemia 02/07/2017  . Anxiety   . Arthritis    deg disease, bulging disk,  shoulder level  . Bipolar disorder ([place])   . Bipolar disorder ([place])   . CHF (congestive heart failure) ([place])   . COPD, severe ([place]) 10/09/2016  . Depression    anxiety  . Hyperlipidemia   . Hypertension   . Peptic ulcer disease    Review  . Pneumonia   . [name] (peripheral vascular disease) ([place]) 06/18/2015  . Shortness of breath    Social History   Socioeconomic History  .  Marital status: Divorced    Spouse name: Not on file  . Number of children: 2  . Years of education: Not on file  . Highest education level: Not on file  Occupational History  . Occupation: Disabled  Tobacco Use  . Smoking status: Former Smoker    Packs/day: 1.00    Years: 40.00    Pack years: 40.00    Types: Cigarettes    Start date: 08/04/1971    Quit date: 01/09/2021    Years since quitting: 0.2  . Smokeless tobacco: Former Systems developer    Quit date: 09/30/2019  . Tobacco comment: peak rate of 2.5ppd, 1/2ppd on 11/27/2016 -- 6 cigarettes / day 01/25/18  Vaping Use  . Vaping Use: Never used  Substance and Sexual  Activity  . Alcohol use: No    Alcohol/week: 0.0 standard drinks  . Drug use: Yes    Types: Marijuana    Comment: most days   . Sexual activity: Not Currently  Other Topics Concern  . Not on file  Social History Narrative   Originally from [place]. Previously has lived in [place]. 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 Questionable asbestos exposure. Does have significant exposure to fumes. No mold exposure. No bird exposure. No pets currently.  Pt lives alone in [place].   Social Determinants of Health   Financial Resource Strain: Not on file  Food Insecurity: Not on file  Transportation Needs: Not on file  Physical Activity: Not on file  Stress: Not on file  Social Connections: Not on file   Family History  Problem Relation Age of Onset  . Breast cancer Mother        deceased  . Heart disease Father   . Depression Daughter   . Anxiety disorder Daughter   . Anxiety disorder Son   . Depression Son   . Asthma Brother   . Heart attack Maternal Aunt   . Heart attack Maternal Uncle   . Heart attack Paternal Aunt   . Heart attack Paternal Uncle   . Heart attack Maternal Grandmother   . Heart attack Maternal Grandfather   . Emphysema Maternal Grandfather   . Heart attack Paternal Grandmother   . Heart attack Paternal Grandfather   . Colon cancer Neg Hx   . Liver disease Neg Hx    Scheduled Meds: . arformoterol  15 mcg Nebulization BID  . budesonide (PULMICORT) nebulizer solution  0.25 mg Nebulization BID  . chlorhexidine gluconate (MEDLINE KIT)  15 mL Mouth Rinse BID  . Chlorhexidine Gluconate Cloth  6 each Topical Daily  . cloNIDine  0.1 mg Transdermal Weekly  . enoxaparin (LOVENOX) injection  40 mg Subcutaneous Q24H  . folic acid  1 mg Intravenous Daily  . furosemide  20 mg Intravenous Daily  . mouth rinse  15 mL Mouth Rinse 10 times per day  . methylPREDNISolone (SOLU-MEDROL) injection  40 mg  Intravenous Q12H  . nicotine  21 mg Transdermal Daily  . pantoprazole (PROTONIX) IV  40 mg Intravenous Q24H  . revefenacin  175 mcg Nebulization Daily  . thiamine injection  100 mg Intravenous Daily   Continuous Infusions: . dexmedetomidine (PRECEDEX) IV infusion 0.4 mcg/kg/hr (04/23/21 1219)  . dextrose 5 % and 0.9 % NaCl with KCl 20 mEq/L 50 mL/hr at 04/23/21 0833  . doxycycline (VIBRAMYCIN) IV Stopped (04/23/21 0005)  . fentaNYL infusion INTRAVENOUS 150 mcg/hr (04/23/21 0833)  . valproate sodium  Stopped (04/23/21 0647)   PRN Meds:.albuterol, haloperidol lactate, LORazepam, midazolam, midazolam Medications Prior to Admission:  Prior to Admission medications   Medication Sig Start Date End Date Taking? Authorizing Provider  acetaminophen (TYLENOL) 325 MG tablet Take 2 tablets (650 mg total) by mouth every 6 (six) hours as needed for mild pain, fever or headache (or Fever >/= 101). 01/10/20  Yes Emokpae, Courage, MD  albuterol (PROVENTIL) (2.5 MG/3ML) 0.083% nebulizer solution Take 3 mLs (2.5 mg total) by nebulization every 4 (four) hours as needed for wheezing or shortness of breath. 01/07/21  Yes Tanda Rockers, MD  albuterol (VENTOLIN HFA) 108 (90 Base) MCG/ACT inhaler Inhale 2 puffs into the lungs every 4 (four) hours as needed for wheezing or shortness of breath. 12/04/19  Yes Noemi Chapel, MD  aspirin EC 81 MG tablet Take 1 tablet (81 mg total) by mouth daily with breakfast. 01/10/20  Yes Emokpae, Courage, MD  atorvastatin (LIPITOR) 20 MG tablet Take 1 tablet (20 mg total) by mouth daily. For high cholesterol Patient taking differently: Take 20 mg by mouth every evening. For high cholesterol 02/09/18  Yes Money, Lowry Ram, FNP  bisoprolol (ZEBETA) 5 MG tablet Take 1 tablet (5 mg total) by mouth daily. 01/07/21  Yes Tanda Rockers, MD  Budeson-Glycopyrrol-Formoterol (BREZTRI AEROSPHERE) 160-9-4.8 MCG/ACT AERO Take 2 puffs first thing in am and then another 2 puffs about 12 hours later.  02/14/21  Yes Tanda Rockers, MD  diltiazem (CARDIZEM CD) 180 MG 24 hr capsule Take 180 mg by mouth daily. 12/25/19  Yes [provider]  divalproex (DEPAKOTE ER) 500 MG 24 hr tablet Take 3 tablets (1,500 mg total) by mouth at bedtime. 01/22/21  Yes Cloria Spring, MD  FLUoxetine (PROZAC) 20 MG capsule Take 1 capsule (20 mg total) by mouth daily. 01/22/21  Yes Cloria Spring, MD  folic acid (FOLVITE) 1 MG tablet Take 1 mg by mouth every morning.  11/29/19  Yes [provider]  furosemide (LASIX) 40 MG tablet TAKE 40 MG DAILY MAY TAKE ADDITIONAL 20 MG AS NEEDED Patient taking differently: TAKE 40 MG DAILY MAY TAKE ADDITIONAL 20 MG (takes 1.5 tabs) 01/16/21  Yes Branch, Alphonse Guild, MD  gabapentin (NEURONTIN) 400 MG capsule Take 400 mg by mouth 3 (three) times daily.   Yes [provider]  guaiFENesin (MUCINEX) 600 MG 12 hr tablet Take 1 tablet (600 mg total) by mouth 2 (two) times daily as needed for cough or to loosen phlegm. 01/10/20  Yes Roxan Hockey, MD  Multiple Vitamin (MULTIVITAMIN) tablet Take 1 tablet by mouth daily.   Yes [provider]  OLANZapine (ZYPREXA) 10 MG tablet Take 1 tablet (10 mg total) by mouth 2 (two) times daily. 01/22/21 01/22/22 Yes Cloria Spring, MD  pantoprazole (PROTONIX) 20 MG tablet Take 20 mg by mouth daily.   Yes [provider]  potassium chloride SA (KLOR-CON) 20 MEQ tablet Take 1 tablet (20 mEq total) by mouth 2 (two) times daily. 03/24/21  Yes Triplett, Tammy, PA-C  propranolol (INDERAL) 10 MG tablet Take 10 mg by mouth 2 (two) times daily. 04/10/21  Yes [provider]  thiamine 100 MG tablet Take 100 mg by mouth daily.   Yes [provider]  traZODone (DESYREL) 50 MG tablet Take 1 tablet (50 mg total) by mouth at bedtime. 01/22/21  Yes Cloria Spring, MD  vitamin B-12 (CYANOCOBALAMIN) 500 MCG tablet Take 1,000 mcg by mouth every morning.    Yes [provider]  doxycycline (VIBRAMYCIN) 100 MG capsule  Take 1 capsule (100 mg total) by mouth 2 (two) times daily. Patient not taking: No sig reported 03/24/21   Triplett, Tammy, PA-C  oxymetazoline (AFRIN) 0.05 % nasal spray Place 1 spray into both nostrils daily. Patient not taking: No sig reported    [provider]  predniSONE (DELTASONE) 10 MG tablet Take by mouth. Patient not taking: No sig reported 03/26/21   [provider]   Allergies  Allergen Reactions  . Augmentin [Amoxicillin-Pot Clavulanate] Rash and Other (See Comments)    Tolerated Zosyn 12/27/2019 Has patient had a PCN reaction causing immediate rash, facial/tongue/throat swelling, SOB or lightheadedness with hypotension: No Has patient had a PCN reaction causing severe rash involving mucus membranes or skin necrosis: No Has patient had a PCN reaction that required hospitalization No Has patient had a PCN reaction occurring within the last 10 years: Yes If all of the above answers are "NO", then may proceed with Cephalosporin use.  . Ace Inhibitors Hives   Review of Systems  Unable to perform ROS: Acuity of condition    Physical Exam Vitals and nursing note reviewed.  Constitutional:      Appearance: He is ill-appearing.     Interventions: He is intubated.  Cardiovascular:     Rate and Rhythm: Normal rate.  Pulmonary:     Effort: He is intubated.  Neurological:     Comments: Intubated/ventilated, sedated     Vital Signs: BP (!) 115/56   Pulse 73   Temp 98.6 F (37 C) (Axillary)   Resp 16   Ht _0  (1.778 m)   Wt 86.4 kg   SpO2 96%   BMI 27.33 kg/m  Pain Scale: CPOT POSS *See Group Information*: 1-Acceptable,Awake and alert Pain Score: 0-No pain   SpO2: SpO2: 96 % O2 Device:SpO2: 96 % O2 Flow Rate: .O2 Flow Rate (L/min): 2 L/min  IO: Intake/output summary:   Intake/Output Summary (Last 24 hours) at 04/23/2021 0841 Last data filed at 04/23/2021 6269 Gross per 24 hour  Intake 2343.8 ml  Output 200 ml  Net 2143.8 ml    LBM: Last BM  Date: 04/22/21 Baseline Weight: Weight: 83.9 kg Most recent weight: Weight: 86.4 kg     Palliative Assessment/Data:   Flowsheet Rows   Flowsheet Row Most Recent Value  Intake Tab   Referral Department Hospitalist  Unit at Time of Referral ICU  Palliative Care Primary Diagnosis Cardiac  Date Notified 04/22/21  Palliative Care Type Return patient Palliative Care  Reason for referral Clarify Goals of Care  Date of Admission 03/27/2021  Date first seen by Palliative Care 04/23/21  # of days Palliative referral response time 1 Day(s)  # of days IP prior to Palliative referral 6  Clinical Assessment   Palliative Performance Scale Score 30%  Pain Max last 24 hours Not able to report  Pain Min Last 24 hours Not able to report  Dyspnea Max Last 24 Hours Not able to report  Dyspnea Min Last 24 hours Not able to report  Psychosocial & Spiritual Assessment   Palliative Care Outcomes       Time In: 1130 Time Out: 1200 Time Total: 30 minutes  Greater than 50%  of this time was spent counseling and coordinating care related to the above assessment and plan.  Signed by: Drue Novel, NP   Please contact Palliative Medicine Team phone at [phone] for questions and concerns.  For individual provider: See Shea Evans

## 2021-04-23 NOTE — Progress Notes (Signed)
NAME:  Jeffrey [name], MRN:  [id number], DOB:  12-12-1960, LOS: 7 ADMISSION DATE:  04/10/2021, CONSULTATION DATE:   04/17/2021 REFERRING MD:  Augusto Gamble, CHIEF COMPLAINT: Respiratory distress on BiPAP  History of Present Illness:  60 year old reportedly quit smoking 12/2020  with known COPD III spirometric criteria plus mod severe Mitral stenosis   and chronic hypoxemic and hypercarbic  respiratory failure on 3 L oxygen, patient of Dr. Melvyn Novas admitted with shortness of breath ongoing x several days not relieved with short acting bronchodilators at home.  He reported nonproductive cough.  In the ED he was confused, ABG showed 7.30/112, COVID testing was positive. He is vaccinated and boosted, it was felt that he was more likely having a COPD exacerbation rather than infective COVID-pneumonia, he was placed on BiPAP, started on remdesivir and steroids. 5/26 increased agitation, not keeping BiPAP on, RN concerned about EtOH withdrawal, PCCM consulted emergently  Pertinent  Medical History  Chronic diastolic heart failure. 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 5/26 increased agitation , not tolerating BiPAP . 5/27 remains on precedex . 5/30 overdsedated on precedex 0.5  > rec taper and try clonidine if bp will tolerate and if not d/c bisoprolol  . ET 5/30 >>>     Micro: COVID  19  PCR  5/25  POS MRSA PCR  5/26   Neg  ET 5/31 >>>   ID Rx remdesovir  5/25 >>> Doxy 5/26 >>>    Scheduled Meds: . arformoterol  15 mcg Nebulization BID  . budesonide (PULMICORT) nebulizer solution  0.25 mg Nebulization BID   . chlorhexidine gluconate (MEDLINE KIT)  15 mL Mouth Rinse BID  . Chlorhexidine Gluconate Cloth  6 each Topical Daily  . clonazePAM  1 mg Per Tube BID  . cloNIDine  0.1 mg Transdermal Weekly  . enoxaparin (LOVENOX) injection  40 mg Subcutaneous Q24H  . folic acid  1 mg Intravenous Daily  . furosemide  20 mg Intravenous Daily  . mouth rinse  15 mL Mouth Rinse 10 times per day  . methylPREDNISolone (SOLU-MEDROL) injection  40 mg Intravenous Q12H  . nicotine  21 mg Transdermal Daily  . pantoprazole (PROTONIX) IV  40 mg Intravenous Q24H  . revefenacin  175 mcg Nebulization Daily  . thiamine injection  100 mg Intravenous Daily   Continuous Infusions: . dexmedetomidine (PRECEDEX) IV infusion 0.4 mcg/kg/hr (04/23/21 0488)  . dextrose 5 % and 0.9 % NaCl with KCl 20 mEq/L 50 mL/hr at 04/23/21 0833  . doxycycline (VIBRAMYCIN) IV Stopped (04/23/21 0005)  . fentaNYL infusion INTRAVENOUS 150 mcg/hr (04/23/21 0833)  . valproate sodium Stopped (04/23/21 0647)   PRN Meds:.albuterol, haloperidol lactate, LORazepam, midazolam, midazolam   Interim History / Subjective:  Intermittently very agitated even back on precedex/ fent drips   Objective   Blood pressure (!) 115/56, pulse 73, temperature 98.6 F (37 C), temperature source Axillary, resp. rate 16, height _0  (1.778 m), weight 86.4 kg, SpO2 96 %.    Vent Mode: PRVC FiO2 (%):  [40 %] 40 % Set Rate:  [18 bmp-22 bmp] 18 bmp Vt Set:  [580 mL] 580 mL PEEP:  [5 cmH20] 5  cmH20 Plateau Pressure:  [17 cmH20-31 cmH20] 18 cmH20   Intake/Output Summary (Last 24 hours) at 04/23/2021 0902 Last data filed at 04/23/2021 9735 Gross per 24 hour  Intake 2343.8 ml  Output 200 ml  Net 2143.8 ml   Filed Weights   03/31/2021 1751 04/21/21 0413  Weight: 83.9 kg 86.4 kg    Examination: Elderly wm, sedated on vent  Tmax  98.8  No jvd Oropharynx et/ og Neck supple Lungs with a distant  exp  rhonchi bilaterally RRR no s3 or or sign murmur Abd soft/  nlexcursion  Extr warm with no edema or clubbing noted         Resolved Hospital Problem list     Assessment & Plan:   Delirium tremens from alcohol withdrawal. Hx of bipolar disease.  - valproic acid 500 mg IV q8h until able to swallow pills - continue to hold outpt prozac, neurontin, zyprexa, trazodone, B12 until able to swallow pills - added catapres patch  as a way of getting off precedex infusion  >>> rx precedex/ fentanyl drips and added clonazepam 1 mg bid 6/1   NB will not be able to do usual wean in this pt as he gets too agitated when not heavily sedated.  Acute on chronic hypoxic, hypercapnic respiratory failure. COPD exacerbation -  Vent dep as as 5/30 with air trapping problematic   Less air trapping today but still present > try reduce RR to 16       COVID 19 positive s/p reported vax x 3  -  Relatively mild COVID 19 pna likely  rx per [facility]= remdesivir  And wean Steroids with TME that might be partly steroid induced psychosis   Hx of HTN, [name], Mitral stenois  - hold outpt aspirin, lipitor,  cardizem, lasix until he is able to swallow pills  >>> catapres TTS  0.1 mg added 5/30   Hypokalemia,resolved  - f/u BMET  Best practice (right click and "Reselect all SmartList Selections" daily)  Diet:  NPO Pain/Anxiety/Delirium protocol (if indicated): Yes (RASS goal 0) VAP protocol (if indicated): Yes DVT prophylaxis: LMWH GI prophylaxis: PPI Glucose control:  SSI No Central venous access:  N/A Arterial line:  N/A Foley:  N/A Mobility:  bed rest  PT consulted: N/A Last date of multidisciplinary goals of care discussion [per TR[facility]] - I would favor NCB status here. Code Status:  full code Disposition: ICU  Labs    CMP Latest Ref Rng & Units 04/23/2021 04/20/2021 04/17/2021  Glucose 70 - 99 mg/dL 114(H) 147(H) 127(H)  BUN 6 - 20 mg/dL 29(H) 26(H) 15  Creatinine 0.61 - 1.24 mg/dL 0.64 0.42(L) 0.57(L)  Sodium 135 - 145 mmol/L 142 140 135  Potassium 3.5 - 5.1  mmol/L 3.6 4.2 3.3(L)  Chloride 98 - 111 mmol/L 96(L) 94(L) 78(L)  CO2 22 - 32 mmol/L 37(H) 39(H) 43(H)  Calcium 8.9 - 10.3 mg/dL 8.6(L) 8.5(L) 9.2  Total Protein 6.5 - 8.1 g/dL - - 6.9  Total Bilirubin 0.3 - 1.2 mg/dL - - 0.6  Alkaline Phos 38 - 126 U/L - - 93  AST 15 - 41 U/L - - 26  ALT 0 - 44 U/L - - 23    CBC Latest Ref Rng & Units 04/23/2021 04/20/2021 04/17/2021  WBC 4.0 - 10.5 K/uL 6.0 5.7 5.1  Hemoglobin 13.0 - 17.0 g/dL 11.4(L) 12.3(L) 12.2(L)  Hematocrit 39.0 - 52.0 % 36.8(L) 39.2 38.1(L)  Platelets 150 - 400 K/uL 157 195 250  ABG    Component Value Date/Time   PHART 7.487 (H) 04/22/2021 0833   PCO2ART 59.1 (H) 04/22/2021 0833   PO2ART 130 (H) 04/22/2021 0833   HCO3 42.0 (H) 04/22/2021 0833   TCO2 34 (H) 12/21/2018 0357   ACIDBASEDEF 0.6 02/17/2019 1850   O2SAT 98.9 04/22/2021 0833    CBG (last 3)  Recent Labs    04/23/21 0225 04/23/21 0538 04/23/21 0836  GLUCAP 134* 137* 127*      The patient is critically ill with multiple organ systems failure and requires high complexity decision making for assessment and support, frequent evaluation and titration of therapies, application of advanced monitoring technologies and extensive interpretation of multiple databases. Critical Care Time devoted to patient care services described in this note is 35 minutes.    Christinia Gully, MD Pulmonary and [place] [phone]   After 7:00 pm call Elink  [phone]

## 2021-04-23 DEATH — deceased

## 2021-04-24 ENCOUNTER — Telehealth (HOSPITAL_COMMUNITY): Payer: Medicare Other | Admitting: Psychiatry

## 2021-04-24 ENCOUNTER — Inpatient Hospital Stay (HOSPITAL_COMMUNITY): Payer: Medicare Other

## 2021-04-24 DIAGNOSIS — I5033 Acute on chronic diastolic (congestive) heart failure: Secondary | ICD-10-CM | POA: Diagnosis not present

## 2021-04-24 DIAGNOSIS — J9622 Acute and chronic respiratory failure with hypercapnia: Secondary | ICD-10-CM | POA: Diagnosis not present

## 2021-04-24 DIAGNOSIS — G9341 Metabolic encephalopathy: Secondary | ICD-10-CM | POA: Diagnosis not present

## 2021-04-24 DIAGNOSIS — J9601 Acute respiratory failure with hypoxia: Secondary | ICD-10-CM | POA: Diagnosis not present

## 2021-04-24 DIAGNOSIS — J9602 Acute respiratory failure with hypercapnia: Secondary | ICD-10-CM | POA: Diagnosis not present

## 2021-04-24 DIAGNOSIS — U071 COVID-19: Secondary | ICD-10-CM | POA: Diagnosis not present

## 2021-04-24 DIAGNOSIS — Z515 Encounter for palliative care: Secondary | ICD-10-CM | POA: Diagnosis not present

## 2021-04-24 DIAGNOSIS — J441 Chronic obstructive pulmonary disease with (acute) exacerbation: Secondary | ICD-10-CM | POA: Diagnosis not present

## 2021-04-24 LAB — COMPREHENSIVE METABOLIC PANEL
ALT: 25 U/L (ref 0–44)
AST: 26 U/L (ref 15–41)
Albumin: 2.4 g/dL — ABNORMAL LOW (ref 3.5–5.0)
Alkaline Phosphatase: 60 U/L (ref 38–126)
Anion gap: 8 (ref 5–15)
BUN: 26 mg/dL — ABNORMAL HIGH (ref 6–20)
CO2: 34 mmol/L — ABNORMAL HIGH (ref 22–32)
Calcium: 8.5 mg/dL — ABNORMAL LOW (ref 8.9–10.3)
Chloride: 101 mmol/L (ref 98–111)
Creatinine, Ser: 0.56 mg/dL — ABNORMAL LOW (ref 0.61–1.24)
GFR, Estimated: >60 mL/min (ref >60)
Glucose, Bld: 100 mg/dL — ABNORMAL HIGH (ref 70–99)
Potassium: 3.1 mmol/L — ABNORMAL LOW (ref 3.5–5.1)
Sodium: 143 mmol/L (ref 135–145)
Total Bilirubin: 0.6 mg/dL (ref 0.3–1.2)
Total Protein: 4.9 g/dL — ABNORMAL LOW (ref 6.5–8.1)

## 2021-04-24 LAB — CBC
HCT: 34.4 % — ABNORMAL LOW (ref 39.0–52.0)
Hemoglobin: 10.6 g/dL — ABNORMAL LOW (ref 13.0–17.0)
MCH: 29 pg (ref 26.0–34.0)
MCHC: 30.8 g/dL (ref 30.0–36.0)
MCV: 94.2 fL (ref 80.0–100.0)
Platelets: 147 10*3/uL — ABNORMAL LOW (ref 150–400)
RBC: 3.65 MIL/uL — ABNORMAL LOW (ref 4.22–5.81)
RDW: 12.3 % (ref 11.5–15.5)
WBC: 5.8 10*3/uL (ref 4.0–10.5)
nRBC: 0 % (ref 0.0–0.2)

## 2021-04-24 LAB — GLUCOSE, CAPILLARY
Glucose-Capillary: 154 mg/dL — ABNORMAL HIGH (ref 70–99)
Glucose-Capillary: 106 mg/dL — ABNORMAL HIGH (ref 70–99)
Glucose-Capillary: 101 mg/dL — ABNORMAL HIGH (ref 70–99)

## 2021-04-24 LAB — FERRITIN: Ferritin: 180 ng/mL (ref 24–336)

## 2021-04-24 LAB — FOLATE: Folate: 23.1 ng/mL (ref >5.9)

## 2021-04-24 LAB — D-DIMER, QUANTITATIVE: D-Dimer, Quant: 0.7 ug/mL-FEU — ABNORMAL HIGH (ref 0.00–0.50)

## 2021-04-24 LAB — VITAMIN B12: Vitamin B-12: 2675 pg/mL — ABNORMAL HIGH (ref 180–914)

## 2021-04-24 LAB — C-REACTIVE PROTEIN: CRP: 1.6 mg/dL — ABNORMAL HIGH (ref <1.0)

## 2021-04-24 LAB — TSH: TSH: 0.731 u[IU]/mL (ref 0.350–4.500)

## 2021-04-24 LAB — T4, FREE: Free T4: 1.07 ng/dL (ref 0.61–1.12)

## 2021-04-24 MED ORDER — ACETAMINOPHEN 650 MG RE SUPP: 650.0000 mg | Freq: Four times a day (QID) | RECTAL | Status: DC | PRN

## 2021-04-24 MED ORDER — HALOPERIDOL 0.5 MG PO TABS: 0.5000 mg | ORAL_TABLET | ORAL | Status: DC | PRN

## 2021-04-24 MED ORDER — BIOTENE DRY MOUTH MT LIQD: 15.0000 mL | OROMUCOSAL | Status: DC | PRN

## 2021-04-24 MED ORDER — POLYVINYL ALCOHOL 1.4 % OP SOLN: 1.0000 [drp] | Freq: Four times a day (QID) | OPHTHALMIC | Status: DC | PRN

## 2021-04-24 MED ORDER — GLYCOPYRROLATE 1 MG PO TABS: 1.0000 mg | ORAL_TABLET | ORAL | Status: DC | PRN

## 2021-04-24 MED ORDER — MIDAZOLAM 50MG/50ML (1MG/ML) PREMIX INFUSION
1.0000 mg/h | INTRAVENOUS | Status: DC
  Administered 2021-04-24: 1 mg/h via INTRAVENOUS
  Filled 2021-04-24: qty 50

## 2021-04-24 MED ORDER — HALOPERIDOL LACTATE 5 MG/ML IJ SOLN: 0.5000 mg | INTRAMUSCULAR | Status: DC | PRN

## 2021-04-24 MED ORDER — ONDANSETRON 4 MG PO TBDP: 4.0000 mg | ORAL_TABLET | Freq: Four times a day (QID) | ORAL | Status: DC | PRN

## 2021-04-24 MED ORDER — ONDANSETRON HCL 4 MG/2ML IJ SOLN: 4.0000 mg | Freq: Four times a day (QID) | INTRAMUSCULAR | Status: DC | PRN

## 2021-04-24 MED ORDER — MIDAZOLAM 50MG/50ML (1MG/ML) PREMIX INFUSION
0.5000 mg/h | INTRAVENOUS | Status: DC
  Administered 2021-04-24: 6 mg/h via INTRAVENOUS
  Filled 2021-04-24 (×2): qty 50

## 2021-04-24 MED ORDER — LORAZEPAM 2 MG/ML IJ SOLN
1.0000 mg | INTRAMUSCULAR | Status: DC | PRN
  Administered 2021-04-24: 2 mg via INTRAVENOUS
  Filled 2021-04-24: qty 1

## 2021-04-24 MED ORDER — POTASSIUM CHLORIDE 20 MEQ PO PACK
40.0000 meq | PACK | Freq: Once | ORAL | Status: AC
  Administered 2021-04-24: 40 meq via ORAL
  Filled 2021-04-24: qty 2

## 2021-04-24 MED ORDER — GLYCOPYRROLATE 0.2 MG/ML IJ SOLN: 0.2000 mg | INTRAMUSCULAR | Status: DC | PRN

## 2021-04-24 MED ORDER — ACETAMINOPHEN 325 MG PO TABS: 650.0000 mg | ORAL_TABLET | Freq: Four times a day (QID) | ORAL | Status: DC | PRN

## 2021-04-24 MED ORDER — HALOPERIDOL LACTATE 2 MG/ML PO CONC
0.5000 mg | ORAL | Status: DC | PRN
  Filled 2021-04-24: qty 0.3

## 2021-04-24 MED ORDER — CLONAZEPAM 0.5 MG PO TABS
1.0000 mg | ORAL_TABLET | Freq: Four times a day (QID) | ORAL | Status: DC
  Administered 2021-04-24: 1 mg
  Filled 2021-04-24: qty 2

## 2021-05-21 ENCOUNTER — Ambulatory Visit: Payer: Medicare Other | Admitting: Cardiology

## 2021-05-23 NOTE — Progress Notes (Signed)
Palliative:  Mr. Kutler, Vanvranken, is lying quietly in bed.  He is intubated/ventilated and sedated.  No family at bedside at this time. Text page from nursing staff that shares daughter, [name], is at bedside.  Bedside meeting with daughter, [name], and nursing staff.  [name] shares Todd's story of decline over the last few months, in particular since his last hospital stay.  She shares that he has overcome ventilator support many times, but this time his mental status has not returned to his norm.  We talked about dignity for Tawanna Cooler, and burdening him from ventilator support that is not changing things for him.  [name] shares that his former wife came to visit yesterday, and her Letta Kocher may want to see Tawanna Cooler, but he is 60 years old.  She tells me that she is ready for compassionate extubation now.  We talked about prognosis, possibly hours to days.  I encouraged [name] that if Tawanna Cooler lives longer than a few hours, it does not mean that she has made the wrong choice.  Conference with attending, CCM, bedside nursing staff, transition of care team related to patient condition, needs, goals of care, compassionate extubation.  Plan: Compassionate extubation, let nature take its course.  Full comfort care Prognosis: Hours to days, anticipate hours.  Anticipate in-hospital death.  65 minutes, extended time Lillia Carmel, NP Palliative medicine team Team phone [phone] Greater than 50% of this time was spent counseling and coordinating care related to the above assessment and plan.

## 2021-05-23 NOTE — Procedures (Signed)
Extubation Procedure Note  Patient Details:   Name: LANKFORD GUTZMER DOB: [birth year] MRN: [id number]   Airway Documentation:    Vent end date: 05/09/2021 Vent end time: 1509   Evaluation  O2 sats: currently acceptable Complications: No apparent complications Patient did tolerate procedure well. Bilateral Breath Sounds: Diminished   No   Patient terminally extubated  Sharene Skeans 05/17/2021, 3:12 PM

## 2021-05-23 NOTE — Progress Notes (Signed)
PROGRESS NOTE  Jeffrey [name] MOL:[id number] DOB: 17-Oct-1961 DOA: 04/07/2021 PCP: Jeffrey Evens, MD  Brief History:  As per H&P written by Jeffrey [name]  Jeffrey [name] a59 y.o.male,with medical history significant forhypertension, hyperlipidemia, COPD(on 3-4 LPM of supplemental oxygen via [place] at baseline), CHF, GERD,as well on home BiPAP machine, with very poor compliance, oriented, who presents to ED secondary to shortness of breath, reports he started to feel worsening shortness of breath today, where he had to call EMS, as his home nebulizer machine has not been helping, he was given albuterol, Atrovent, as well Solu-Medrol, he reports improvement of his symptoms after EMS treatment, he denies any fever, chills, nausea, vomiting, no abdominal pain, no COVID exposures, no chest pain, no leg swelling, reports he received his COVID-vaccine and booster. -in EDpatient is mildly confused, but awake, ABG showing PCO2 retention of 112, pH of 7.3, his COVID-19 test was positive, his chest x-ray chronic some chronic interstitial changes and COPD, with possible superimposed atypical infection, [facility] consulted to admit.  5/26 increased agitation, not keeping BiPAP on, RN concerned about EtOH withdrawal, PCCM consulted emergently.  Intubated early am 04/22/21   5/26 increased agitation , not tolerating BiPAP  5/27 remains on precedex  5/30 overdsedated on precedex 0.5 >rec taper and try clonidine if bp will tolerate and if not d/c bisoprolol   ET 5/31>>>     Micro: COVID 19 PCR 5/25 POS MRSA PCR 5/26 Neg  ET 5/31 >>>   ID Rx remdesivir 5/25 >>>5/28 Doxy 5/26 >>>   Assessment/Plan: acute on chronic hypoxic and hypercarbic respiratory failure secondary to COPD exacerbation and COVID-19 pneumonia -Chest x-ray is also raising concern for component of bronchiectasis/bacterial infection; will continue doxycycline; 2 more days left to  complete antibiotic therapy. -Competed treatment with remdesivir and continue receiving steroidstapering. -Continue bronchodilators/nebulizer management as per PCCM recommendations.  -Currently intubated and mechanically ventilated; will follow PCCM assistance and recommendation. -Palliative care consulted; per PCCM note CODE BLUE status favored. -Most recentABG demonstratingPH 7.487CO2 of59.1and a PO2 of 130. -finished 4 days remdesivir -continue IV steroids -check PCT<0.10 -started baricitinib 6/1 -started enteral feeding  alcohol abuse with withdrawal symptoms/delirium tremens/bipolar disorder -Looking for a RASS score of 0 -Continue as needed anxiolytic and antipsychotic medications. -Now that the patient is intubated decision has been made to use Precedex (0.6)  along with fentanyl (175 mcg) drips. 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 -will continue nicotine patch.  Chronic diastolic CHF (congestive heart failure)  -appears Compensated currently -Continue home dose of Lasix, IV now while unable to take PO's. -continue Daily weights-strict I's and O's. -10/04/20 Echo--EF 55-60%, no WMA  Essential hypertension -Overall stable and well-controlled. -Continue to follow vital signs and treat accordingly. -Continue clonidine patch.  hypokalemia -repleted -check mag 2.0       Status is: Inpatient  Remains inpatient appropriate because:Persistent severe electrolyte disturbances and Inpatient level of care appropriate due to severity of illness   Dispo: The patient is from:  Home  Anticipated d/c is to: Home  Patient currently is not medically stable to d/c.  Difficult to [name] patient No        Family Communication:   Daughter updated 6/2  Consultants:  PCCM  Code Status:  FULL   DVT Prophylaxis:  Jeffrey [name] Lovenox   Procedures: As Listed in Progress Note Above  Antibiotics: Doxy 5/25>>6/1     Subjective: Patient agitated with decreasing sedation.  No reports of vomiting, diarrhea, uncontrolled pain.  Remains on ventilator  Objective: Vitals:   05/08/2021 0440 05-08-21 0756 2021/05/08 0800 May 08, 2021 0801  BP:   (!) 119/56   Pulse:   67   Resp:   16   Temp:    (!) 96.1 F (35.6 C)  TempSrc:    Oral  SpO2: 96% 95% 96%   Weight:      Height:        Intake/Output Summary (Last 24 hours) at 08-May-2021 1046 Last data filed at May 08, 2021 0805 Gross per 24 hour  Intake 3169.98 ml  Output 1725 ml  Net 1444.98 ml   Weight change:  Exam:   General:  Pt is sedated on vent  HEENT: No icterus, No thrush, No neck mass, Major/AT  Cardiovascular: RRR, S1/S2, no rubs, no gallops  Respiratory: bibasilar rales. No wheeze  Abdomen: Soft/+BS, non tender, non distended, no guarding  Extremities: No edema, No lymphangitis, No petechiae, No rashes, no synovitis   Data Reviewed: I have personally reviewed following [name] and imaging studies Basic Metabolic Panel: Recent [name]  Lab 04/18/21 0407 04/20/21 0528 04/23/21 0444 05-08-2021 0748  NA  --  140 142 143  K  --  4.2 3.6 3.1*  CL  --  94* 96* 101  CO2  --  39* 37* 34*  GLUCOSE  --  147* 114* 100*  BUN  --  26* 29* 26*  CREATININE  --  0.42* 0.64 0.56*  CALCIUM  --  8.5* 8.6* 8.5*  MG 2.5*  --  2.0  --    Liver Function Tests: Recent [name]  Lab 2021-05-08 0748  AST 26  ALT 25  ALKPHOS 60  BILITOT 0.6  PROT 4.9*  ALBUMIN 2.4*   No results for input(s): LIPASE, AMYLASE in the last 168 hours. No results for input(s): AMMONIA in the last  168 hours. Coagulation Profile: No results for input(s): INR, PROTIME in the last 168 hours. CBC: Recent [name]  Lab 04/20/21 0744 04/23/21 0444 May 08, 2021 0748  WBC 5.7 6.0 5.8  HGB 12.3* 11.4* 10.6*  HCT 39.2 36.8* 34.4*  MCV 93.8 93.6 94.2  PLT 195 157 147*   Cardiac Enzymes: No results for input(s): CKTOTAL, CKMB, CKMBINDEX, TROPONINI in the last 168 hours. BNP: Invalid input(s): POCBNP CBG: Recent [name]  Lab 04/23/21 1822 04/23/21 1959 04/23/21 2344 05-08-21 0400 05/08/21 0757  GLUCAP 151* 151* 133* 154* 106*   HbA1C: No results for input(s): HGBA1C in the last 72 hours. Urine analysis:    Component Value Date/Time   COLORURINE YELLOW 09/29/2020 0340   APPEARANCEUR CLEAR 09/29/2020 0340   LABSPEC 1.009 09/29/2020 0340   PHURINE 7.0 09/29/2020 0340   GLUCOSEU NEGATIVE 09/29/2020 0340   HGBUR MODERATE (A) 09/29/2020 0340   BILIRUBINUR NEGATIVE 09/29/2020 0340   KETONESUR NEGATIVE 09/29/2020 0340   PROTEINUR NEGATIVE 09/29/2020 0340   UROBILINOGEN 0.2 03/01/2011 1427   NITRITE NEGATIVE 09/29/2020 0340   LEUKOCYTESUR NEGATIVE 09/29/2020 0340   Sepsis [name]: @LABRCNTIP (procalcitonin:4,lacticidven:4) ) Recent Results (from the past 240 hour(s))  Resp Panel by RT-PCR (Flu A&B, Covid) Nasopharyngeal Swab     Status: Abnormal  Collection Time: 03/26/2021  6:13 PM   Specimen: Nasopharyngeal Swab; Nasopharyngeal(NP) swabs in vial transport medium  Result Value Ref Range Status   SARS Coronavirus 2 by RT PCR POSITIVE (A) NEGATIVE Final    Comment: RESULT CALLED TO, READ BACK BY AND VERIFIED WITH: K BELTON,RN@2133  04/15/2021 MKELLY (NOTE) SARS-CoV-2 target nucleic acids are DETECTED.  The SARS-CoV-2 RNA is generally detectable in upper respiratory specimens during the acute phase of infection. Positive results are indicative of the presence of the identified virus, but do not rule out bacterial infection or co-infection with other pathogens not detected by the test.  Clinical correlation with patient history and other diagnostic information is necessary to determine patient infection status. The expected result is Negative.  Fact Sheet for Patients: EntrepreneurPulse.com.au  Fact Sheet for Healthcare Providers: IncredibleEmployment.be  This test is not yet approved or cleared by the [place] FDA and  has been authorized for detection and/or diagnosis of SARS-CoV-2 by FDA under an Emergency Use Authorization (EUA).  This EUA will remain in effect (meaning this test can be  used) for the duration of  the COVID-19 declaration under Section 564(b)(1) of the Act, 21 U.S.C. section 360bbb-3(b)(1), unless the authorization is terminated or revoked sooner.     Influenza A by PCR NEGATIVE NEGATIVE Final   Influenza B by PCR NEGATIVE NEGATIVE Final    Comment: (NOTE) The Xpert Xpress SARS-CoV-2/FLU/RSV plus assay is intended as an aid in the diagnosis of influenza from Nasopharyngeal swab specimens and should not be used as a sole basis for treatment. Nasal washings and aspirates are unacceptable for Xpert Xpress SARS-CoV-2/FLU/RSV testing.  Fact Sheet for Patients: EntrepreneurPulse.com.au  Fact Sheet for Healthcare Providers: IncredibleEmployment.be  This test is not yet approved or cleared by the [place] FDA and has been authorized for detection and/or diagnosis of SARS-CoV-2 by FDA under an Emergency Use Authorization (EUA). This EUA will remain in effect (meaning this test can be used) for the duration of the COVID-19 declaration under Section 564(b)(1) of the Act, 21 U.S.C. section 360bbb-3(b)(1), unless the authorization is terminated or revoked.  Performed at [facility], [address] [place], [place] [address]   MRSA PCR Screening     Status: None   Collection Time: 04/17/21 12:46 AM   Specimen: Nasal Mucosa; Nasopharyngeal  Result Value Ref Range  Status   MRSA by PCR NEGATIVE NEGATIVE Final    Comment:        The GeneXpert MRSA Assay (FDA approved for NASAL specimens only), is one component of a comprehensive MRSA colonization surveillance program. It is not intended to diagnose MRSA infection nor to guide or monitor treatment for MRSA infections. Performed at [facility], [address] [place], [place] [address]   Resp Panel by RT-PCR (Flu A&B, Covid) Nasopharyngeal Swab     Status: None   Collection Time: 04/23/21  4:21 PM   Specimen: Nasopharyngeal Swab; Nasopharyngeal(NP) swabs in vial transport medium  Result Value Ref Range Status   SARS Coronavirus 2 by RT PCR NEGATIVE NEGATIVE Final    Comment: (NOTE) SARS-CoV-2 target nucleic acids are NOT DETECTED.  The SARS-CoV-2 RNA is generally detectable in upper respiratory specimens during the acute phase of infection. The lowest concentration of SARS-CoV-2 viral copies this assay can detect is 138 copies/mL. A negative result does not preclude SARS-Cov-2 infection and should not be used as the sole basis for treatment or other patient management decisions. A negative result may occur with  improper specimen collection/handling,  submission of specimen other than nasopharyngeal swab, presence of viral mutation(s) within the areas targeted by this assay, and inadequate number of viral copies(<138 copies/mL). A negative result must be combined with clinical observations, patient history, and epidemiological information. The expected result is Negative.  Fact Sheet for Patients:  EntrepreneurPulse.com.au  Fact Sheet for Healthcare Providers:  IncredibleEmployment.be  This test is no t yet approved or cleared by the [place] FDA and  has been authorized for detection and/or diagnosis of SARS-CoV-2 by FDA under an Emergency Use Authorization (EUA). This EUA will remain  in effect (meaning this test can be used) for the duration of  the COVID-19 declaration under Section 564(b)(1) of the Act, 21 U.S.C.section 360bbb-3(b)(1), unless the authorization is terminated  or revoked sooner.       Influenza A by PCR NEGATIVE NEGATIVE Final   Influenza B by PCR NEGATIVE NEGATIVE Final    Comment: (NOTE) The Xpert Xpress SARS-CoV-2/FLU/RSV plus assay is intended as an aid in the diagnosis of influenza from Nasopharyngeal swab specimens and should not be used as a sole basis for treatment. Nasal washings and aspirates are unacceptable for Xpert Xpress SARS-CoV-2/FLU/RSV testing.  Fact Sheet for Patients: EntrepreneurPulse.com.au  Fact Sheet for Healthcare Providers: IncredibleEmployment.be  This test is not yet approved or cleared by the [place] FDA and has been authorized for detection and/or diagnosis of SARS-CoV-2 by FDA under an Emergency Use Authorization (EUA). This EUA will remain in effect (meaning this test can be used) for the duration of the COVID-19 declaration under Section 564(b)(1) of the Act, 21 U.S.C. section 360bbb-3(b)(1), unless the authorization is terminated or revoked.  Performed at [facility], [address], [place], [place] [address]      Scheduled Meds: . arformoterol  15 mcg Nebulization BID  . baricitinib  4 mg Per Tube Daily  . budesonide (PULMICORT) nebulizer solution  0.25 mg Nebulization BID  . chlorhexidine gluconate (MEDLINE KIT)  15 mL Mouth Rinse BID  . Chlorhexidine Gluconate Cloth  6 each Topical Daily  . clonazepam  1 mg Per Tube Q6H  . cloNIDine  0.1 mg Transdermal Weekly  . enoxaparin (LOVENOX) injection  40 mg Subcutaneous Q24H  . folic acid  1 mg Intravenous Daily  . furosemide  20 mg Intravenous Daily  . mouth rinse  15 mL Mouth Rinse 10 times per day  . methylPREDNISolone (SOLU-MEDROL) injection  40 mg Intravenous Q12H  . nicotine  21 mg Transdermal Daily  . pantoprazole (PROTONIX) IV  40 mg Intravenous Q24H  .  revefenacin  175 mcg Nebulization Daily   Continuous Infusions: . dexmedetomidine (PRECEDEX) IV infusion 0.5 mcg/kg/hr (2021/05/10 0805)  . dextrose 5 % and 0.9 % NaCl with KCl 20 mEq/L 50 mL/hr at May 10, 2021 0805  . feeding supplement (VITAL AF 1.2 CAL) 1,000 mL (04/23/21 1620)  . fentaNYL infusion INTRAVENOUS 250 mcg/hr (10-May-2021 0805)  . thiamine injection 500 mg (04/23/21 2334)  . valproate sodium Stopped (05/10/21 0730)    Procedures/Studies: DG Chest Port 1 View  Result Date: 05-10-21 CLINICAL DATA:  Acute respiratory failure with hypoxia. Intubated. COVID positive. EXAM: PORTABLE CHEST 1 VIEW COMPARISON:  Apr 22, 2021. FINDINGS: Endotracheal tube tip is approximately 3.5 cm above the carina. Gastric tube courses below the diaphragm in outside the field of view. Left lung nodule better characterized on recent CT chest from Mar 24, 2021. No substantial change generalized interstitial thickening with mild patchy right hilar and bibasilar opacities. No visible pleural effusions or  pneumothorax on this single semi erect radiograph IMPRESSION: 1. Endotracheal tube tip approximately 3.5 cm above the carina. 2. Similar generalized interstitial prominence with mild patchy right hilar and bibasilar opacities, suspicious for pneumonia superimposed on emphysema. 3. Left lung nodule better characterized on recent CT chest. Electronically Signed   By: Margaretha Sheffield MD   On: 04/29/21 06:28   DG Chest Port 1 View  Result Date: 04/22/2021 CLINICAL DATA:  COVID-19 positive, intubated EXAM: PORTABLE CHEST 1 VIEW COMPARISON:  04/05/2021 FINDINGS: 2 frontal views of the chest demonstrate endotracheal tube overlying tracheal air column tip midway between thoracic inlet and carina. Enteric catheter passes below diaphragm tip and side port projecting over gastric fundus. The cardiac silhouette is stable. Diffuse emphysema again noted. Patchy consolidation is seen within the right suprahilar region and within the  medial left lung base, which could reflect multifocal pneumonia. Trace bilateral pleural effusions. No pneumothorax. No acute displaced fractures. IMPRESSION: 1. Support devices as above. 2. Patchy right upper and left lower lobe airspace disease superimposed upon background emphysema, consistent with multifocal pneumonia. 3. Trace bilateral pleural effusions. Electronically Signed   By: Randa Ngo M.D.   On: 04/22/2021 03:47   DG Chest Port 1 View  Result Date: 03/31/2021 CLINICAL DATA:  Dyspnea, shortness of breath, diminished lung sounds EXAM: PORTABLE CHEST 1 VIEW COMPARISON:  CT 03/24/2021, radiograph 03/24/2021 FINDINGS: Coarsened reticulonodular opacities and bronchitic change with a basilar predominance, similar to comparison exam. No new consolidative opacity is seen. No pneumothorax or effusion. The aorta is calcified. The remaining cardiomediastinal contours are unremarkable. No acute osseous or soft tissue abnormality. Telemetry leads overlie the chest. IMPRESSION: Coarsened reticulonodular and bronchitic changes, most pronounced in the mid to lower lungs much of which could reflect some chronic interstitial changes and COPD though a superimposed atypical infectious or inflammatory process is not entirely excluded. Redemonstration of a pulmonary nodule in the mid left lung. Linear opacity in the left lung base, possible scarring or atelectasis. Aortic Atherosclerosis (ICD10-I70.0). Electronically Signed   By: Lovena Le M.D.   On: 03/25/2021 19:20    Orson Eva, DO  [facility]  If 7PM-7AM, please contact night-coverage [email or web address] Password [email or web address] 04-29-2021, 10:46 AM   LOS: 8 days

## 2021-05-23 NOTE — Accreditation Note (Signed)
Restraints not reported to CMS Pursuant to regulation 482.13 (G) (3) use of soft wrist restraints was logged. 

## 2021-05-23 NOTE — Progress Notes (Signed)
Verbal order to give additional dose of ativan per Dr.Tat. Patient given 2mg  ativan IVP.

## 2021-05-23 NOTE — Death Summary Note (Signed)
DEATH SUMMARY   Patient Details  Name: Jeffrey [name] MRN: [id number] DOB: [birth year]  Admission/Discharge Information   Admit Date:  Apr 18, 2021  Date of Death: Date of Death: 04/26/21  Time of Death: Time of Death: 10-14-16  Length of Stay: 8  Referring Physician: Gareth Morgan, MD   Reason(s) for Hospitalization  Shortness of Breath, COPD exacerbation, COVID-19  Diagnoses  Preliminary cause of death: COPD and COVID-19 Secondary Diagnoses (including complications and co-morbidities):  acute on chronic hypoxic and hypercarbic respiratory failure secondary to COPD exacerbation and COVID-19 pneumonia -Chest x-ray is also raising concern for component of bronchiectasis/bacterial infection; will continue doxycycline; 2 more days left to complete antibiotic therapy. -Competed treatment with remdesivir and continue receiving steroidstapering. -Continue bronchodilators/nebulizer management as per PCCM recommendations.  -Currently intubated and mechanically ventilated; will follow PCCM assistance and recommendation. -Palliative care consulted; per PCCM note CODE BLUE status favored. -Most recentABG demonstratingPH 7.487CO2 of59.1and a PO2 of 130. -finished 4 days remdesivir -continue IV steroids -check PCT<0.10 -started baricitinib 6/1 -started enteral feeding 6/1 -difficulty weaning due severe agitation and resp distress with decreasing sedation  alcohol abuse with withdrawal symptoms/delirium tremens/bipolar disorder -Looking for a RASS score of 0 -Continue as needed anxiolytic and antipsychotic medications. -Now that the patient is intubated decision has been made to use Precedex(0.6)along with fentanyl (175 mcg)drips. 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 Sherene [name] -increase klonopin to 1 mg q6 hours -EEG--cancelled due to patient transitioned to comfort care  Cigarette smoker -Cessation counseling provided. - continued nicotine patch.  Chronic diastolic CHF (congestive heart failure)  -appears Compensated currently -Continue home dose of Lasix, IV now while unable to take PO's. -continue Daily weights-strict I's and O's. -10/04/20 Echo--EF 55-60%, no WMA  Essential hypertension -Overall stable and well-controlled. -Continue to follow vital signs and treat accordingly. -Continue clonidine patch.  hypokalemia -repleted -check mag 2.0   Brief Hospital Course (including significant findings, care, treatment, and services provided and events leading to death)  [name] a59 y.o.male,with medical history significant forhypertension, hyperlipidemia, COPD(on 3-4 LPM of supplemental oxygen via [place] at baseline), CHF, GERD,as well on home BiPAP machine, with very poor compliance, oriented, who presents to ED secondary to shortness of breath, reports he started to feel worsening shortness of breath today, where he had to call EMS, as his home nebulizer machine has not been helping, he was given albuterol, Atrovent, as well Solu-Medrol, he reports improvement of his symptoms after EMS treatment, he denies any fever, chills, nausea, vomiting, no abdominal pain, no COVID exposures, no chest pain, no leg swelling, reports he received his COVID-vaccine and booster. -in EDpatient is mildly confused, but awake, ABG showing PCO2 retention of 112, pH of 7.3, his COVID-19 test was positive, his chest x-ray chronic some chronic interstitial changes and COPD, with possible superimposed atypical infection, [facility] hospitalist consulted to admit.  5/26 increased agitation, not keeping BiPAP on, RN concerned about EtOH withdrawal, PCCM consulted emergently. Intubated early am  04/22/21   5/26 increased agitation , not tolerating BiPAP  5/27 remains on precedex  5/30 overdsedated on precedex 0.5 >rec taper and try clonidine if bp will tolerate and if not d/c bisoprolol   ET 5/31>>>     Micro: COVID 19 PCR 04/19/23 POS MRSA PCR 5/26 Neg  ET 5/31 >>>  ID Rx remdesivir 5/25 >>>5/28 Doxy 5/26 >>> PCCM continued to follow the patient.  Weaning was initiated on the ventilator, but patient was a dificult wean due to severe agitation when sedation was decreased.  Palliative medicine was consulted.  GOC was discussed with family and daughter who was the primary decision maker.  After discussion as a team with the family, it was felt that the patient would be a very difficult wean and PCCM felt patient may not wean and subsequently result in a tracheostomy and gastrostomy tube.  This was discussed with patient's daughter and palliative medicine.  Daughter felt that patient was suffering and would not have any quality of life given his numerous prior hospitalizations and grim outlook.  She made the decision to transition the patient to comfort measures approach.  She favored compassionate extubation.  With the assistance of palliative medicine and PCCM, the patient was extubated and kept comfortable.   Pertinent Labs and Studies  Significant Diagnostic Studies DG Chest Port 1 View  Result Date: 05/14/2021 CLINICAL DATA:  Acute respiratory failure with hypoxia. Intubated. COVID positive. EXAM: PORTABLE CHEST 1 VIEW COMPARISON:  Apr 22, 2021. FINDINGS: Endotracheal tube tip is approximately 3.5 cm above the carina. Gastric tube courses below the diaphragm in outside the field of view. Left lung nodule better characterized on recent CT chest from Mar 24, 2021. No substantial change generalized interstitial thickening with mild patchy right hilar and bibasilar opacities. No visible pleural effusions or pneumothorax on this single semi erect radiograph IMPRESSION:  1. Endotracheal tube tip approximately 3.5 cm above the carina. 2. Similar generalized interstitial prominence with mild patchy right hilar and bibasilar opacities, suspicious for pneumonia superimposed on emphysema. 3. Left lung nodule better characterized on recent CT chest. Electronically Signed   By: Feliberto Harts MD   On: 05/02/2021 06:28   DG Chest Port 1 View  Result Date: 04/22/2021 CLINICAL DATA:  COVID-19 positive, intubated EXAM: PORTABLE CHEST 1 VIEW COMPARISON:  04-25-21 FINDINGS: 2 frontal views of the chest demonstrate endotracheal tube overlying tracheal air column tip midway between thoracic inlet and carina. Enteric catheter passes below diaphragm tip and side port projecting over gastric fundus. The cardiac silhouette is stable. Diffuse emphysema again noted. Patchy consolidation is seen within the right suprahilar region and within the medial left lung base, which could reflect multifocal pneumonia. Trace bilateral pleural effusions. No pneumothorax. No acute displaced fractures. IMPRESSION: 1. Support devices as above. 2. Patchy right upper and left lower lobe airspace disease superimposed upon background emphysema, consistent with multifocal pneumonia. 3. Trace bilateral pleural effusions. Electronically Signed   By: Sharlet Salina M.D.   On: 04/22/2021 03:47   DG Chest Port 1 View  Result Date: 04-25-21 CLINICAL DATA:  Dyspnea, shortness of breath, diminished lung sounds EXAM: PORTABLE CHEST 1 VIEW COMPARISON:  CT 03/24/2021, radiograph 03/24/2021 FINDINGS: Coarsened reticulonodular opacities and bronchitic change with a basilar predominance, similar to comparison exam. No new consolidative opacity is seen. No pneumothorax or effusion. The aorta is calcified. The remaining cardiomediastinal contours are unremarkable. No acute osseous or soft tissue abnormality. Telemetry leads overlie the chest. IMPRESSION: Coarsened reticulonodular and bronchitic changes, most pronounced in  the mid to lower lungs much of which could reflect some chronic interstitial changes and COPD though a superimposed atypical infectious or inflammatory process is not entirely excluded. Redemonstration of a pulmonary nodule in the mid left lung. Linear opacity in the left lung base, possible scarring or atelectasis. Aortic Atherosclerosis (ICD10-I70.0). Electronically Signed  By: Kreg Shropshire M.D.   On: 04/09/2021 19:20    Microbiology Recent Results (from the past 240 hour(s))  Resp Panel by RT-PCR (Flu A&B, Covid) Nasopharyngeal Swab     Status: Abnormal   Collection Time: 04/09/2021  6:13 PM   Specimen: Nasopharyngeal Swab; Nasopharyngeal(NP) swabs in vial transport medium  Result Value Ref Range Status   SARS Coronavirus 2 by RT PCR POSITIVE (A) NEGATIVE Final    Comment: RESULT CALLED TO, READ BACK BY AND VERIFIED WITH: K BELTON,RN@[phone]  04/13/2021 MKELLY (NOTE) SARS-CoV-2 target nucleic acids are DETECTED.  The SARS-CoV-2 RNA is generally detectable in upper respiratory specimens during the acute phase of infection. Positive results are indicative of the presence of the identified virus, but do not rule out bacterial infection or co-infection with other pathogens not detected by the test. Clinical correlation with patient history and other diagnostic information is necessary to determine patient infection status. The expected result is Negative.  Fact Sheet for Patients: BloggerCourse.com  Fact Sheet for Healthcare Providers: SeriousBroker.it  This test is not yet approved or cleared by the [place] FDA and  has been authorized for detection and/or diagnosis of SARS-CoV-2 by FDA under an Emergency Use Authorization (EUA).  This EUA will remain in effect (meaning this test can be  used) for the duration of  the COVID-19 declaration under Section 564(b)(1) of the Act, 21 U.S.C. section 360bbb-3(b)(1), unless the authorization  is terminated or revoked sooner.     Influenza A by PCR NEGATIVE NEGATIVE Final   Influenza B by PCR NEGATIVE NEGATIVE Final    Comment: (NOTE) The Xpert Xpress SARS-CoV-2/FLU/RSV plus assay is intended as an aid in the diagnosis of influenza from Nasopharyngeal swab specimens and should not be used as a sole basis for treatment. Nasal washings and aspirates are unacceptable for Xpert Xpress SARS-CoV-2/FLU/RSV testing.  Fact Sheet for Patients: BloggerCourse.com  Fact Sheet for Healthcare Providers: SeriousBroker.it  This test is not yet approved or cleared by the [place] FDA and has been authorized for detection and/or diagnosis of SARS-CoV-2 by FDA under an Emergency Use Authorization (EUA). This EUA will remain in effect (meaning this test can be used) for the duration of the COVID-19 declaration under Section 564(b)(1) of the Act, 21 U.S.C. section 360bbb-3(b)(1), unless the authorization is terminated or revoked.  Performed at [facility], [address] [place], [place] [address]   MRSA PCR Screening     Status: None   Collection Time: 04/17/21 12:46 AM   Specimen: Nasal Mucosa; Nasopharyngeal  Result Value Ref Range Status   MRSA by PCR NEGATIVE NEGATIVE Final    Comment:        The GeneXpert MRSA Assay (FDA approved for NASAL specimens only), is one component of a comprehensive MRSA colonization surveillance program. It is not intended to diagnose MRSA infection nor to guide or monitor treatment for MRSA infections. Performed at [facility], [address], [place], [place] [address]   Resp Panel by RT-PCR (Flu A&B, Covid) Nasopharyngeal Swab     Status: None   Collection Time: 04/23/21  4:21 PM   Specimen: Nasopharyngeal Swab; Nasopharyngeal(NP) swabs in vial transport medium  Result Value Ref Range Status   SARS Coronavirus 2 by RT PCR NEGATIVE NEGATIVE Final    Comment: (NOTE) SARS-CoV-2 target  nucleic acids are NOT DETECTED.  The SARS-CoV-2 RNA is generally detectable in upper respiratory specimens during the acute phase of infection. The lowest concentration of SARS-CoV-2 viral copies this assay  can detect is 138 copies/mL. A negative result does not preclude SARS-Cov-2 infection and should not be used as the sole basis for treatment or other patient management decisions. A negative result may occur with  improper specimen collection/handling, submission of specimen other than nasopharyngeal swab, presence of viral mutation(s) within the areas targeted by this assay, and inadequate number of viral copies(<138 copies/mL). A negative result must be combined with clinical observations, patient history, and epidemiological information. The expected result is Negative.  Fact Sheet for Patients:  BloggerCourse.com  Fact Sheet for Healthcare Providers:  SeriousBroker.it  This test is no t yet approved or cleared by the [place] FDA and  has been authorized for detection and/or diagnosis of SARS-CoV-2 by FDA under an Emergency Use Authorization (EUA). This EUA will remain  in effect (meaning this test can be used) for the duration of the COVID-19 declaration under Section 564(b)(1) of the Act, 21 U.S.C.section 360bbb-3(b)(1), unless the authorization is terminated  or revoked sooner.       Influenza A by PCR NEGATIVE NEGATIVE Final   Influenza B by PCR NEGATIVE NEGATIVE Final    Comment: (NOTE) The Xpert Xpress SARS-CoV-2/FLU/RSV plus assay is intended as an aid in the diagnosis of influenza from Nasopharyngeal swab specimens and should not be used as a sole basis for treatment. Nasal washings and aspirates are unacceptable for Xpert Xpress SARS-CoV-2/FLU/RSV testing.  Fact Sheet for Patients: BloggerCourse.com  Fact Sheet for Healthcare  Providers: SeriousBroker.it  This test is not yet approved or cleared by the [place] FDA and has been authorized for detection and/or diagnosis of SARS-CoV-2 by FDA under an Emergency Use Authorization (EUA). This EUA will remain in effect (meaning this test can be used) for the duration of the COVID-19 declaration under Section 564(b)(1) of the Act, 21 U.S.C. section 360bbb-3(b)(1), unless the authorization is terminated or revoked.  Performed at [facility], [address], [place], [place] [address]     Lab Basic Metabolic Panel: Recent Labs  Lab 04/20/21 0528 04/23/21 0444 05/16/2021 0748  NA 140 142 143  K 4.2 3.6 3.1*  CL 94* 96* 101  CO2 39* 37* 34*  GLUCOSE 147* 114* 100*  BUN 26* 29* 26*  CREATININE 0.42* 0.64 0.56*  CALCIUM 8.5* 8.6* 8.5*  MG  --  2.0  --    Liver Function Tests: Recent Labs  Lab 05-16-21 0748  AST 26  ALT 25  ALKPHOS 60  BILITOT 0.6  PROT 4.9*  ALBUMIN 2.4*   No results for input(s): LIPASE, AMYLASE in the last 168 hours. No results for input(s): AMMONIA in the last 168 hours. CBC: Recent Labs  Lab 04/20/21 0744 04/23/21 0444 May 16, 2021 0748  WBC 5.7 6.0 5.8  HGB 12.3* 11.4* 10.6*  HCT 39.2 36.8* 34.4*  MCV 93.8 93.6 94.2  PLT 195 157 147*   Cardiac Enzymes: No results for input(s): CKTOTAL, CKMB, CKMBINDEX, TROPONINI in the last 168 hours. Sepsis Labs: Recent Labs  Lab 04/20/21 0744 04/23/21 0444 2021-05-16 0748  PROCALCITON  --  <0.10  --   WBC 5.7 6.0 5.8    Procedures/Operations     Jaeliana Lococo 04/25/2021, 4:47 PM

## 2021-05-23 NOTE — Progress Notes (Signed)
Patient made comfort care. RT at bedside to extubate. Continuous versed and fentanyl ordered for comfort. [name] is at bedside. Chaplain in room. Patient is resting comfortably and peaceful.

## 2021-05-23 NOTE — Progress Notes (Signed)
NAME:  Jeffrey [name], MRN:  [id number], DOB:  14-Jan-1961, LOS: 8 ADMISSION DATE:  04/07/2021, CONSULTATION DATE:   04/17/2021 REFERRING MD:  Augusto Gamble, CHIEF COMPLAINT: Respiratory distress on BiPAP  History of Present Illness:  60 year old reportedly quit smoking 12/2020  with known COPD III spirometric criteria plus mod severe Mitral stenosis   and chronic hypoxemic and hypercarbic  respiratory failure on 3 L oxygen, patient of Dr. Melvyn Novas admitted with shortness of breath ongoing x several days not relieved with short acting bronchodilators at home.  He reported nonproductive cough.  In the ED he was confused, ABG showed 7.30/112, COVID testing was positive. He is vaccinated and boosted, it was felt that he was more likely having a COPD exacerbation rather than infective COVID-pneumonia, he was placed on BiPAP, started on remdesivir and steroids. 5/26 increased agitation, not keeping BiPAP on, RN concerned about EtOH withdrawal, PCCM consulted emergently  Pertinent  Medical History  Chronic diastolic heart failure. 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 5/26 increased agitation , not tolerating BiPAP . 5/27 remains on precedex . 5/30 overdsedated on precedex 0.5  > rec taper and try clonidine if bp will tolerate and if not d/c bisoprolol  . ET 5/30 >>> . Daughter made NCB      Micro: COVID  19  PCR  5/25  POS MRSA PCR  5/26   Neg  ET 5/31 >>> not sent    ID Rx remdesovir  5/25  - 5/28  Doxy 5/26 >>> Maricitinib 6/2 >>>    Scheduled Meds: . arformoterol  15 mcg Nebulization BID  . baricitinib   4 mg Per Tube Daily  . budesonide (PULMICORT) nebulizer solution  0.25 mg Nebulization BID  . chlorhexidine gluconate (MEDLINE KIT)  15 mL Mouth Rinse BID  . Chlorhexidine Gluconate Cloth  6 each Topical Daily  . clonazePAM  1 mg Per Tube Q6H  . cloNIDine  0.1 mg Transdermal Weekly  . enoxaparin (LOVENOX) injection  40 mg Subcutaneous Q24H  . folic acid  1 mg Intravenous Daily  . furosemide  20 mg Intravenous Daily  . mouth rinse  15 mL Mouth Rinse 10 times per day  . methylPREDNISolone (SOLU-MEDROL) injection  40 mg Intravenous Q12H  . nicotine  21 mg Transdermal Daily  . pantoprazole (PROTONIX) IV  40 mg Intravenous Q24H  . potassium chloride  40 mEq Oral Once  . revefenacin  175 mcg Nebulization Daily   Continuous Infusions: . dexmedetomidine (PRECEDEX) IV infusion 0.4 mcg/kg/hr (05-11-21 1228)  . dextrose 5 % and 0.9 % NaCl with KCl 20 mEq/L 50 mL/hr at May 11, 2021 1228  . feeding supplement (VITAL AF 1.2 CAL) 1,000 mL (04/23/21 1620)  . fentaNYL infusion INTRAVENOUS 75 mcg/hr (05/11/21 1228)  . thiamine injection 500 mg (04/23/21 2334)  . valproate sodium Stopped (05-11-21 0730)   PRN Meds:.albuterol, haloperidol lactate, LORazepam, midazolam   Interim History / Subjective:  Very agitated on WUA this am ? sz's ? > ativan eliminated    Objective   Blood pressure (!) 91/56, pulse 91, temperature 98.7 F (37.1 C), temperature source Oral, resp. rate 16, height 5' 10" (1.778 m), weight  88 kg, SpO2 93 %.    Vent Mode: PRVC FiO2 (%):  [30 %-40 %] 40 % Set Rate:  [16 bmp] 16 bmp Vt Set:  [580 mL] 580 mL PEEP:  [5 cmH20] 5 cmH20 Plateau Pressure:  [15 cmH20-21 cmH20] 17 cmH20   Intake/Output Summary (Last 24 hours) at April 29, 2021 1303 Last data filed at 2021/04/29 1228 Gross per 24 hour  Intake 3054.02 ml  Output 1725 ml  Net 1329.02 ml   Filed Weights   04/15/2021 1751 04/21/21 0413 29-Apr-2021 0415  Weight: 83.9 kg 86.4 kg 88 kg    Examination: tmax 98..7  General appearance:     Elderly wm chronically and acutely ill appearing    No jvd Oropharynx ET//  NG Neck supple Lungs with a few scattered exp > insp rhonchi bilaterally RRR no s3 or or sign murmur Abd  Soft nl excursion tol tf ok  Extr warm with no edema or clubbing noted   I personally reviewed images and agree with radiology impression as follows:  CXR:  Portable 6/2 1. Endotracheal tube tip approximately 3.5 cm above the carina. 2. Similar generalized interstitial prominence with mild patchy right hilar and bibasilar opacities, suspicious for pneumonia superimposed on emphysema. 3. Left lung nodule better characterized on recent CT chest.                Resolved Hospital Problem list     Assessment & Plan:   Delirium tremens from alcohol withdrawal. Hx of bipolar disease.  - valproic acid 500 mg IV q8h   - continue to hold outpt prozac, neurontin, zyprexa, trazodone, B12 until able to swallow pills - added catapres patch  as a way of getting off precedex infusion  >>> rx precedex/ fentanyl drips and added clonazepam 1 mg bid 6/1 >> ? sz so increased clonazepam to 1 mg qid/ versed drip in place of precex >> offered [facility] transfer NICU vs comfort rx > daughter chose latter/ NCB agreed.      Acute on chronic hypoxic, hypercapnic respiratory failure. COPD exacerbation -  Vent dep as as 5/30 with air trapping less  problematic on RR = 16      COVID 19 positive s/p reported vax x 3  -  Relatively mild COVID 19 pna likely  rx per [facility]= remdesivir/baricitnib  And solumedrol    Hx of HTN, [name], Mitral stenois  - hold outpt aspirin, lipitor,  cardizem, lasix until he is able to swallow pills  >>> catapres TTS  0.1 mg added 5/30   Hypokalemia,resolved  - f/u BMET  Best practice (right click and "Reselect all SmartList Selections" daily)  Diet:  Tube Feed  Pain/Anxiety/Delirium protocol (if indicated): Yes (RASS goal 0) VAP protocol (if indicated): Yes DVT prophylaxis: LMWH GI  prophylaxis: PPI Glucose control:  SSI No Central venous access:  N/A Arterial line:  N/A Foley:  N/A Mobility:  bed rest  PT consulted: N/A Last date of multidisciplinary goals of care discussion NCB as of 6/2 Code Status:  DNR Disposition: ICU  Labs    CMP Latest Ref Rng & Units 29-Apr-2021 04/23/2021 04/20/2021  Glucose 70 - 99 mg/dL 100(H) 114(H) 147(H)  BUN 6 - 20 mg/dL 26(H) 29(H) 26(H)  Creatinine 0.61 - 1.24 mg/dL 0.56(L) 0.64 0.42(L)  Sodium 135 - 145 mmol/L 143 142 140  Potassium 3.5 - 5.1 mmol/L 3.1(L) 3.6 4.2  Chloride 98 - 111 mmol/L 101 96(L) 94(L)  CO2 22 - 32 mmol/L 34(H) 37(H) 39(H)  Calcium  8.9 - 10.3 mg/dL 8.5(L) 8.6(L) 8.5(L)  Total Protein 6.5 - 8.1 g/dL 4.9(L) - -  Total Bilirubin 0.3 - 1.2 mg/dL 0.6 - -  Alkaline Phos 38 - 126 U/L 60 - -  AST 15 - 41 U/L 26 - -  ALT 0 - 44 U/L 25 - -    CBC Latest Ref Rng & Units 05/10/2021 04/23/2021 04/20/2021  WBC 4.0 - 10.5 K/uL 5.8 6.0 5.7  Hemoglobin 13.0 - 17.0 g/dL 10.6(L) 11.4(L) 12.3(L)  Hematocrit 39.0 - 52.0 % 34.4(L) 36.8(L) 39.2  Platelets 150 - 400 K/uL 147(L) 157 195    ABG    Component Value Date/Time   PHART 7.525 (H) 04/23/2021 1300   PCO2ART 47.7 04/23/2021 1300   PO2ART 60.8 (L) 04/23/2021 1300   HCO3 38.2 (H) 04/23/2021 1300   TCO2 34 (H) 12/21/2018 0357   ACIDBASEDEF 0.6 02/17/2019 1850   O2SAT 92.8 04/23/2021 1300    CBG (last 3)  Recent Labs    05/10/21 0400 May 10, 2021 0757 May 10, 2021 1142  GLUCAP 154* 106* 101*      Discussed with Dr Tat/daughter > NCB and have palliative care to see  ? One way extubation 6/3    The patient is critically ill with multiple organ systems failure and requires high complexity decision making for assessment and support, frequent evaluation and titration of therapies, application of advanced monitoring technologies and extensive interpretation of multiple databases. Critical Care Time devoted to patient care services described in this note is 45 minutes.     Christinia Gully, MD Pulmonary [facility] [phone]   After 7:00 pm call Elink  [phone]

## 2021-05-23 NOTE — Progress Notes (Addendum)
Nutrition Follow up  DOCUMENTATION CODES:   Not applicable  INTERVENTION:  Vital 1.2 @ 40 ml/hr via NGT -recommend increase by 10 ml every 6 hours to goal rate of 60 ml/hr.   Add ProSource TF- 45 ml-BID  Tube feeding regimen provides 1808 kcal, 130 grams of protein, and 1168 ml of H2O.     NUTRITION DIAGNOSIS:   Inadequate oral intake related to inability to eat as evidenced by NPO status. -addressed with tube feeds  GOAL:  Provide needs based on ASPEN/SCCM guidelines  -progressing  MONITOR:  Vent status,Labs,I & O's,TF tolerance,Weight trends    REASON FOR ASSESSMENT:   Ventilator    ASSESSMENT: Patient is a  60 yo male with hx of COPD (chronic home O2 @ 3-4 L), HF, HTN, Pneumonia [name] and alcohol use. Presents with COPD exacerbation and COVID-19 pneumonia.    Patient intubated on ventilator support since 5/31- not tolerating BiPAP. Talked with nursing. No plan for weaning at this time. MV: 11.6  L/min Temp (24hrs), Avg:97.4 F (36.3 C), Min:96.1 F (35.6 C), Max:98.7 F (37.1 C)  Precedex, Fentanyl: sedation  6/2 Patient unable to wean currently. Talked with his nurse. Tube feeding started yesterday afternoon it was turned off briefly but is being resumed at current rate of 40 ml/hr. No problem with tolerance. Recommend advance to goal of 60 ml/hr and add Protein at noted above to meet est needs. Current regimen is providing 960 kcal, 84 gr protein and 803 ml water. Last BM-5/31.   Intake/Output Summary (Last 24 hours) at 04/27/2021 1214 Last data filed at 05/07/2021 0805 Gross per 24 hour  Intake 3169.98 ml  Output 1725 ml  Net 1444.98 ml  Since admission + 5.99 liters.   IVF: D5@ 50 ml/hr   Medications: lasix, folic acid, B-1, Protonix, Solumedrol, Thiamine.   Labs: BMP Latest Ref Rng & Units 05/05/2021 04/23/2021 04/20/2021  Glucose 70 - 99 mg/dL 735(H) 299(M) 426(S)  BUN 6 - 20 mg/dL 34(H) 96(Q) 22(L)  Creatinine 0.61 - 1.24 mg/dL 7.98(X) 2.11 9.41(D)   Sodium 135 - 145 mmol/L 143 142 140  Potassium 3.5 - 5.1 mmol/L 3.1(L) 3.6 4.2  Chloride 98 - 111 mmol/L 101 96(L) 94(L)  CO2 22 - 32 mmol/L 34(H) 37(H) 39(H)  Calcium 8.9 - 10.3 mg/dL 4.0(C) 1.4(G) 8.1(E)     NUTRITION - FOCUSED PHYSICAL EXAM: Unable to complete Nutrition-Focused physical exam at this time.    Diet Order:   Diet Order            Diet NPO time specified  Diet effective now                 EDUCATION NEEDS:  Not appropriate for education at this time  Skin:  Skin Assessment: Reviewed RN Assessment  Last BM:  5/31  Height:   Ht Readings from Last 1 Encounters:  04/25/21 5\' 10"  (1.778 m)    Weight:   Wt Readings from Last 1 Encounters:  05/11/2021 88 kg    Ideal Body Weight:   75 kg  BMI:  Body mass index is 27.84 kg/m.  Estimated Nutritional Needs:   Kcal:  1945  Protein:  115-129 gr  Fluid:  1.9-2.0 Liters daily   06/24/21 MS,RD,CSG,LDN Contact: [email or web address]

## 2021-05-23 NOTE — Progress Notes (Signed)
Sedation cut down 75% in attempt to wean off ventilator and attempt SBT. Patient woke up when stimulated but would not follow commands or respond. Patient began to have seizure like activity. Patient's eyes rolled back and began having tremors. Event started at 0943. Patient's heart rate jumped from 70s to 140s and 150s. At this point patient was still not following commands. 2mg  ativan given at 0944. Tremors stopped at 0948. Patient still unresponsive, not following commands, and eyes rolled back into his head. No purposeful movement. MD aware.

## 2021-05-23 DEATH — deceased
